# Patient Record
Sex: Female | Born: 1955 | Race: Asian | Hispanic: No | Smoking: Former smoker
Health system: Southern US, Community
[De-identification: ages and names within clinical notes are randomized; demographics above are authoritative.]

## PROBLEM LIST (undated history)

## (undated) DIAGNOSIS — N3080 Other cystitis without hematuria: Secondary | ICD-10-CM

## (undated) DIAGNOSIS — R109 Unspecified abdominal pain: Secondary | ICD-10-CM

## (undated) DIAGNOSIS — E7800 Pure hypercholesterolemia, unspecified: Secondary | ICD-10-CM

## (undated) DIAGNOSIS — M7502 Adhesive capsulitis of left shoulder: Secondary | ICD-10-CM

## (undated) DIAGNOSIS — N179 Acute kidney failure, unspecified: Secondary | ICD-10-CM

## (undated) DIAGNOSIS — I10 Essential (primary) hypertension: Secondary | ICD-10-CM

## (undated) DIAGNOSIS — E119 Type 2 diabetes mellitus without complications: Secondary | ICD-10-CM

## (undated) DIAGNOSIS — J45909 Unspecified asthma, uncomplicated: Secondary | ICD-10-CM

## (undated) DIAGNOSIS — E876 Hypokalemia: Secondary | ICD-10-CM

## (undated) DIAGNOSIS — F32A Depression, unspecified: Secondary | ICD-10-CM

## (undated) DIAGNOSIS — B3781 Candidal esophagitis: Secondary | ICD-10-CM

## (undated) DIAGNOSIS — F329 Major depressive disorder, single episode, unspecified: Secondary | ICD-10-CM

## (undated) DIAGNOSIS — K852 Alcohol induced acute pancreatitis without necrosis or infection: Secondary | ICD-10-CM

## (undated) DIAGNOSIS — E1110 Type 2 diabetes mellitus with ketoacidosis without coma: Secondary | ICD-10-CM

## (undated) DIAGNOSIS — N189 Chronic kidney disease, unspecified: Secondary | ICD-10-CM

## (undated) HISTORY — DX: Hypokalemia: E87.6

## (undated) HISTORY — DX: Major depressive disorder, single episode, unspecified: F32.9

## (undated) HISTORY — DX: Candidal esophagitis: B37.81

## (undated) HISTORY — DX: Adhesive capsulitis of left shoulder: M75.02

## (undated) HISTORY — DX: Unspecified abdominal pain: R10.9

## (undated) HISTORY — DX: Alcohol induced acute pancreatitis without necrosis or infection: K85.20

## (undated) HISTORY — DX: Acute kidney failure, unspecified: N18.9

## (undated) HISTORY — DX: Type 2 diabetes mellitus with ketoacidosis without coma: E11.10

## (undated) HISTORY — DX: Other cystitis without hematuria: N30.80

## (undated) HISTORY — DX: Depression, unspecified: F32.A

## (undated) HISTORY — PX: EYE SURGERY: SHX253

## (undated) HISTORY — DX: Chronic kidney disease, unspecified: N17.9

## (undated) HISTORY — DX: Acute kidney failure, unspecified: N17.9

---

## 1986-01-05 HISTORY — PX: HAND SURGERY: SHX662

## 1994-01-05 HISTORY — PX: ABDOMINAL HYSTERECTOMY: SHX81

## 1995-01-06 HISTORY — PX: APPENDECTOMY: SHX54

## 2008-09-18 ENCOUNTER — Emergency Department: Payer: Self-pay | Admitting: Emergency Medicine

## 2010-12-01 ENCOUNTER — Emergency Department: Payer: Self-pay | Admitting: Internal Medicine

## 2010-12-05 ENCOUNTER — Inpatient Hospital Stay: Payer: Self-pay | Admitting: Otolaryngology

## 2011-01-06 HISTORY — PX: THYROID SURGERY: SHX805

## 2011-04-20 ENCOUNTER — Emergency Department: Payer: Self-pay | Admitting: *Deleted

## 2011-04-20 LAB — URINALYSIS, COMPLETE
Bilirubin,UR: NEGATIVE
Glucose,UR: >=500 mg/dL (ref 0–75)
Ph: 5 (ref 4.5–8.0)
RBC,UR: 5 /HPF (ref 0–5)
Specific Gravity: 1.012 (ref 1.003–1.030)
Squamous Epithelial: 3
WBC UR: 310 /HPF (ref 0–5)

## 2011-04-20 LAB — COMPREHENSIVE METABOLIC PANEL
Alkaline Phosphatase: 152 U/L — ABNORMAL HIGH (ref 50–136)
Anion Gap: 20 — ABNORMAL HIGH (ref 7–16)
Calcium, Total: 9.4 mg/dL (ref 8.5–10.1)
Co2: 16 mmol/L — ABNORMAL LOW (ref 21–32)
EGFR (African American): >60
Glucose: 300 mg/dL — ABNORMAL HIGH (ref 65–99)
SGPT (ALT): 41 U/L
Sodium: 135 mmol/L — ABNORMAL LOW (ref 136–145)

## 2011-04-20 LAB — CBC
HCT: 42.7 % (ref 35.0–47.0)
HGB: 14 g/dL (ref 12.0–16.0)
RBC: 4.51 10*6/uL (ref 3.80–5.20)

## 2011-05-29 ENCOUNTER — Observation Stay: Payer: Self-pay | Admitting: Specialist

## 2011-05-29 LAB — BASIC METABOLIC PANEL
Anion Gap: 14 (ref 7–16)
Calcium, Total: 8.7 mg/dL (ref 8.5–10.1)
Chloride: 98 mmol/L (ref 98–107)
Co2: 24 mmol/L (ref 21–32)
Creatinine: 0.47 mg/dL — ABNORMAL LOW (ref 0.60–1.30)
EGFR (African American): >60
EGFR (Non-African Amer.): >60
Glucose: 252 mg/dL — ABNORMAL HIGH (ref 65–99)
Osmolality: 279 (ref 275–301)
Potassium: 3.6 mmol/L (ref 3.5–5.1)
Sodium: 136 mmol/L (ref 136–145)

## 2011-05-29 LAB — CK TOTAL AND CKMB (NOT AT ARMC)
CK, Total: 58 U/L (ref 21–215)
CK, Total: 51 U/L (ref 21–215)
CK-MB: <0.5 ng/mL — ABNORMAL LOW (ref 0.5–3.6)
CK-MB: 0.6 ng/mL (ref 0.5–3.6)

## 2011-05-29 LAB — HEPATIC FUNCTION PANEL A (ARMC)
Alkaline Phosphatase: 114 U/L (ref 50–136)
Bilirubin, Direct: <0.1 mg/dL (ref 0.00–0.20)
Bilirubin,Total: 0.3 mg/dL (ref 0.2–1.0)
SGOT(AST): 29 U/L (ref 15–37)
SGPT (ALT): 41 U/L
Total Protein: 8.2 g/dL (ref 6.4–8.2)

## 2011-05-29 LAB — CBC
MCHC: 32.7 g/dL (ref 32.0–36.0)
MCV: 95 fL (ref 80–100)
RBC: 4.28 10*6/uL (ref 3.80–5.20)
RDW: 12.8 % (ref 11.5–14.5)
WBC: 5.4 10*3/uL (ref 3.6–11.0)

## 2011-05-29 LAB — TROPONIN I: Troponin-I: <0.02 ng/mL

## 2011-05-30 LAB — BASIC METABOLIC PANEL
Anion Gap: 14 (ref 7–16)
BUN: 17 mg/dL (ref 7–18)
Calcium, Total: 7.8 mg/dL — ABNORMAL LOW (ref 8.5–10.1)
Chloride: 100 mmol/L (ref 98–107)
Co2: 23 mmol/L (ref 21–32)
EGFR (African American): >60
EGFR (Non-African Amer.): >60
Osmolality: 287 (ref 275–301)
Potassium: 4 mmol/L (ref 3.5–5.1)
Sodium: 137 mmol/L (ref 136–145)

## 2011-05-30 LAB — CBC WITH DIFFERENTIAL/PLATELET
Basophil #: 0 10*3/uL (ref 0.0–0.1)
Basophil %: 0.5 %
Eosinophil #: 0.1 10*3/uL (ref 0.0–0.7)
HGB: 13.1 g/dL (ref 12.0–16.0)
Lymphocyte %: 27.3 %
MCH: 32 pg (ref 26.0–34.0)
MCHC: 33.9 g/dL (ref 32.0–36.0)
MCV: 95 fL (ref 80–100)
Monocyte #: 0.8 x10 3/mm (ref 0.2–0.9)
Monocyte %: 10.2 %
Platelet: 145 10*3/uL — ABNORMAL LOW (ref 150–440)
RBC: 4.1 10*6/uL (ref 3.80–5.20)
RDW: 12.7 % (ref 11.5–14.5)
WBC: 7.5 10*3/uL (ref 3.6–11.0)

## 2011-05-30 LAB — CK TOTAL AND CKMB (NOT AT ARMC)
CK, Total: 53 U/L (ref 21–215)
CK-MB: 1.2 ng/mL (ref 0.5–3.6)

## 2011-05-30 LAB — TROPONIN I: Troponin-I: <0.02 ng/mL

## 2011-09-01 ENCOUNTER — Inpatient Hospital Stay: Payer: Self-pay | Admitting: Internal Medicine

## 2011-09-01 LAB — COMPREHENSIVE METABOLIC PANEL
BUN: 14 mg/dL (ref 7–18)
Calcium, Total: 7.9 mg/dL — ABNORMAL LOW (ref 8.5–10.1)
EGFR (African American): >60
EGFR (Non-African Amer.): >60
Glucose: 496 mg/dL — ABNORMAL HIGH (ref 65–99)
SGOT(AST): 89 U/L — ABNORMAL HIGH (ref 15–37)
SGPT (ALT): 98 U/L — ABNORMAL HIGH (ref 12–78)
Sodium: 125 mmol/L — ABNORMAL LOW (ref 136–145)
Total Protein: 9.4 g/dL — ABNORMAL HIGH (ref 6.4–8.2)

## 2011-09-01 LAB — BASIC METABOLIC PANEL
Anion Gap: 17 — ABNORMAL HIGH (ref 7–16)
BUN: 17 mg/dL (ref 7–18)
BUN: 19 mg/dL — ABNORMAL HIGH (ref 7–18)
Chloride: 114 mmol/L — ABNORMAL HIGH (ref 98–107)
Chloride: 115 mmol/L — ABNORMAL HIGH (ref 98–107)
Co2: 6 mmol/L — CL (ref 21–32)
Creatinine: 0.93 mg/dL (ref 0.60–1.30)
EGFR (African American): >60
EGFR (African American): >60
EGFR (Non-African Amer.): >60
Glucose: 251 mg/dL — ABNORMAL HIGH (ref 65–99)
Potassium: 3.2 mmol/L — ABNORMAL LOW (ref 3.5–5.1)
Potassium: 3.1 mmol/L — ABNORMAL LOW (ref 3.5–5.1)
Sodium: 140 mmol/L (ref 136–145)

## 2011-09-01 LAB — URINALYSIS, COMPLETE
Bilirubin,UR: NEGATIVE
Glucose,UR: >=500 mg/dL (ref 0–75)
Leukocyte Esterase: NEGATIVE
Ph: 6 (ref 4.5–8.0)
Specific Gravity: 1.017 (ref 1.003–1.030)
Squamous Epithelial: 1
WBC UR: 1 /HPF (ref 0–5)

## 2011-09-01 LAB — CBC WITH DIFFERENTIAL/PLATELET
Bands: 6 %
Comment - H1-Com3: NORMAL
HGB: 16.2 g/dL — ABNORMAL HIGH (ref 12.0–16.0)
MCHC: 32.8 g/dL (ref 32.0–36.0)
Metamyelocyte: 1 %
Promyelocyte: 1 %
RBC: 4.97 10*6/uL (ref 3.80–5.20)
Segmented Neutrophils: 56 %
WBC: 12.3 10*3/uL — ABNORMAL HIGH (ref 3.6–11.0)

## 2011-09-01 LAB — LIPASE, BLOOD: Lipase: >3000 U/L (ref 73–393)

## 2011-09-02 LAB — CBC WITH DIFFERENTIAL/PLATELET
Basophil #: 0 10*3/uL (ref 0.0–0.1)
Eosinophil #: 0 10*3/uL (ref 0.0–0.7)
HCT: 40.7 % (ref 35.0–47.0)
HGB: 13.6 g/dL (ref 12.0–16.0)
Lymphocyte #: 0.4 10*3/uL — ABNORMAL LOW (ref 1.0–3.6)
Lymphocyte %: 5.5 %
MCHC: 33.4 g/dL (ref 32.0–36.0)
Monocyte %: 12.4 %
Neutrophil #: 6.3 10*3/uL (ref 1.4–6.5)
Platelet: 115 10*3/uL — ABNORMAL LOW (ref 150–440)
RBC: 4.3 10*6/uL (ref 3.80–5.20)
RDW: 12.8 % (ref 11.5–14.5)

## 2011-09-02 LAB — COMPREHENSIVE METABOLIC PANEL
Alkaline Phosphatase: 113 U/L (ref 50–136)
Anion Gap: 15 (ref 7–16)
Calcium, Total: 8 mg/dL — ABNORMAL LOW (ref 8.5–10.1)
Chloride: 114 mmol/L — ABNORMAL HIGH (ref 98–107)
Co2: 14 mmol/L — ABNORMAL LOW (ref 21–32)
EGFR (Non-African Amer.): 51 — ABNORMAL LOW
SGOT(AST): 43 U/L — ABNORMAL HIGH (ref 15–37)
SGPT (ALT): 57 U/L (ref 12–78)

## 2011-09-02 LAB — LIPID PANEL
Cholesterol: 395 mg/dL — ABNORMAL HIGH (ref 0–200)
HDL Cholesterol: 52 mg/dL (ref 40–60)
Triglycerides: 1436 mg/dL — ABNORMAL HIGH (ref 0–200)

## 2011-09-02 LAB — POTASSIUM
Potassium: 3 mmol/L — ABNORMAL LOW (ref 3.5–5.1)
Potassium: 2.7 mmol/L — ABNORMAL LOW (ref 3.5–5.1)

## 2011-09-02 LAB — MAGNESIUM: Magnesium: 1.9 mg/dL

## 2011-09-03 LAB — BASIC METABOLIC PANEL
Anion Gap: 18 — ABNORMAL HIGH (ref 7–16)
Anion Gap: 10 (ref 7–16)
BUN: 14 mg/dL (ref 7–18)
BUN: 12 mg/dL (ref 7–18)
Calcium, Total: 8 mg/dL — ABNORMAL LOW (ref 8.5–10.1)
Calcium, Total: 8.1 mg/dL — ABNORMAL LOW (ref 8.5–10.1)
Chloride: 114 mmol/L — ABNORMAL HIGH (ref 98–107)
Chloride: 116 mmol/L — ABNORMAL HIGH (ref 98–107)
Chloride: 112 mmol/L — ABNORMAL HIGH (ref 98–107)
Co2: 10 mmol/L — CL (ref 21–32)
Co2: 16 mmol/L — ABNORMAL LOW (ref 21–32)
Co2: 13 mmol/L — ABNORMAL LOW (ref 21–32)
Creatinine: 1 mg/dL (ref 0.60–1.30)
Creatinine: 0.85 mg/dL (ref 0.60–1.30)
EGFR (African American): >60
EGFR (African American): >60
EGFR (Non-African Amer.): >60
EGFR (Non-African Amer.): >60
Glucose: 194 mg/dL — ABNORMAL HIGH (ref 65–99)
Glucose: 364 mg/dL — ABNORMAL HIGH (ref 65–99)
Osmolality: 296 (ref 275–301)
Osmolality: 289 (ref 275–301)
Osmolality: 290 (ref 275–301)
Potassium: 3.3 mmol/L — ABNORMAL LOW (ref 3.5–5.1)
Potassium: 4.3 mmol/L (ref 3.5–5.1)
Sodium: 142 mmol/L (ref 136–145)
Sodium: 138 mmol/L (ref 136–145)

## 2011-09-03 LAB — LIPASE, BLOOD
Lipase: >3000 U/L (ref 73–393)
Lipase: 2478 U/L — ABNORMAL HIGH (ref 73–393)

## 2011-09-03 LAB — POTASSIUM: Potassium: 3.1 mmol/L — ABNORMAL LOW (ref 3.5–5.1)

## 2011-09-04 LAB — BASIC METABOLIC PANEL
Anion Gap: 10 (ref 7–16)
BUN: 8 mg/dL (ref 7–18)
BUN: 7 mg/dL (ref 7–18)
Calcium, Total: 8.4 mg/dL — ABNORMAL LOW (ref 8.5–10.1)
Calcium, Total: 8.5 mg/dL (ref 8.5–10.1)
Chloride: 115 mmol/L — ABNORMAL HIGH (ref 98–107)
Co2: 17 mmol/L — ABNORMAL LOW (ref 21–32)
Co2: 18 mmol/L — ABNORMAL LOW (ref 21–32)
Creatinine: 0.8 mg/dL (ref 0.60–1.30)
EGFR (African American): >60
EGFR (African American): >60
EGFR (Non-African Amer.): >60
EGFR (Non-African Amer.): >60
Glucose: 142 mg/dL — ABNORMAL HIGH (ref 65–99)
Glucose: 138 mg/dL — ABNORMAL HIGH (ref 65–99)
Osmolality: 284 (ref 275–301)
Osmolality: 285 (ref 275–301)
Sodium: 143 mmol/L (ref 136–145)

## 2011-09-04 LAB — HEMOGLOBIN: HGB: 12.5 g/dL (ref 12.0–16.0)

## 2011-10-21 ENCOUNTER — Emergency Department: Payer: Self-pay | Admitting: *Deleted

## 2011-10-21 LAB — CBC
MCHC: 35.4 g/dL (ref 32.0–36.0)
MCV: 90 fL (ref 80–100)
Platelet: 187 10*3/uL (ref 150–440)
RDW: 12 % (ref 11.5–14.5)
WBC: 8.2 10*3/uL (ref 3.6–11.0)

## 2011-10-21 LAB — URINALYSIS, COMPLETE
Blood: NEGATIVE
Glucose,UR: >=500 mg/dL (ref 0–75)
Leukocyte Esterase: NEGATIVE
Nitrite: NEGATIVE
Protein: NEGATIVE
Specific Gravity: 1.009 (ref 1.003–1.030)
Squamous Epithelial: 1
WBC UR: 1 /HPF (ref 0–5)

## 2011-10-21 LAB — COMPREHENSIVE METABOLIC PANEL
Albumin: 3.9 g/dL (ref 3.4–5.0)
Anion Gap: 12 (ref 7–16)
Bilirubin,Total: 0.5 mg/dL (ref 0.2–1.0)
Calcium, Total: 8.6 mg/dL (ref 8.5–10.1)
Co2: 23 mmol/L (ref 21–32)
Creatinine: 0.77 mg/dL (ref 0.60–1.30)
Glucose: 301 mg/dL — ABNORMAL HIGH (ref 65–99)
Osmolality: 285 (ref 275–301)
Potassium: 3.7 mmol/L (ref 3.5–5.1)
Sodium: 137 mmol/L (ref 136–145)

## 2011-12-20 ENCOUNTER — Emergency Department: Payer: Self-pay | Admitting: Emergency Medicine

## 2012-02-10 ENCOUNTER — Emergency Department: Payer: Self-pay | Admitting: Emergency Medicine

## 2012-02-10 LAB — BASIC METABOLIC PANEL
BUN: 15 mg/dL (ref 7–18)
Chloride: 100 mmol/L (ref 98–107)
EGFR (African American): >60
EGFR (Non-African Amer.): >60
Glucose: 150 mg/dL — ABNORMAL HIGH (ref 65–99)
Osmolality: 274 (ref 275–301)

## 2012-02-10 LAB — URINALYSIS, COMPLETE
Blood: NEGATIVE
Nitrite: NEGATIVE
Specific Gravity: 1.002 (ref 1.003–1.030)
Squamous Epithelial: 3
WBC UR: 10 /HPF (ref 0–5)

## 2012-02-10 LAB — TROPONIN I: Troponin-I: <0.02 ng/mL

## 2012-02-10 LAB — CBC
HGB: 12.9 g/dL (ref 12.0–16.0)
MCH: 30.3 pg (ref 26.0–34.0)
MCV: 89 fL (ref 80–100)
RBC: 4.26 10*6/uL (ref 3.80–5.20)
RDW: 12.2 % (ref 11.5–14.5)
WBC: 8.9 10*3/uL (ref 3.6–11.0)

## 2012-02-10 LAB — CK TOTAL AND CKMB (NOT AT ARMC)
CK, Total: 84 U/L (ref 21–215)
CK-MB: 1.3 ng/mL (ref 0.5–3.6)

## 2012-11-16 ENCOUNTER — Emergency Department: Payer: Self-pay | Admitting: Emergency Medicine

## 2012-12-13 ENCOUNTER — Emergency Department: Payer: Self-pay | Admitting: Emergency Medicine

## 2012-12-13 LAB — COMPREHENSIVE METABOLIC PANEL
Albumin: 3.7 g/dL (ref 3.4–5.0)
Alkaline Phosphatase: 156 U/L — ABNORMAL HIGH
Anion Gap: 8 (ref 7–16)
Chloride: 101 mmol/L (ref 98–107)
Creatinine: 0.78 mg/dL (ref 0.60–1.30)
EGFR (African American): >60
EGFR (Non-African Amer.): >60
Glucose: 285 mg/dL — ABNORMAL HIGH (ref 65–99)
Osmolality: 285 (ref 275–301)
Potassium: 3.9 mmol/L (ref 3.5–5.1)
SGOT(AST): 57 U/L — ABNORMAL HIGH (ref 15–37)
Total Protein: 8.3 g/dL — ABNORMAL HIGH (ref 6.4–8.2)

## 2012-12-13 LAB — CBC
HCT: 40.3 % (ref 35.0–47.0)
MCH: 31.7 pg (ref 26.0–34.0)
MCHC: 34.5 g/dL (ref 32.0–36.0)
MCV: 92 fL (ref 80–100)
Platelet: 221 10*3/uL (ref 150–440)

## 2013-08-11 ENCOUNTER — Emergency Department: Payer: Self-pay | Admitting: Student

## 2013-08-11 LAB — URINALYSIS, COMPLETE
Bacteria: NONE SEEN
Bilirubin,UR: NEGATIVE
Glucose,UR: >=500 mg/dL (ref 0–75)
Hyaline Cast: 2
Leukocyte Esterase: NEGATIVE
Nitrite: NEGATIVE
PH: 5 (ref 4.5–8.0)
Protein: 100
RBC,UR: 9 /HPF (ref 0–5)
Specific Gravity: 1.03 (ref 1.003–1.030)
Squamous Epithelial: 10
WBC UR: 17 /HPF (ref 0–5)

## 2013-08-11 LAB — BASIC METABOLIC PANEL
ANION GAP: 17 — AB (ref 7–16)
BUN: 20 mg/dL — AB (ref 7–18)
CHLORIDE: 94 mmol/L — AB (ref 98–107)
Calcium, Total: 9 mg/dL (ref 8.5–10.1)
Co2: 21 mmol/L (ref 21–32)
Creatinine: 0.84 mg/dL (ref 0.60–1.30)
EGFR (African American): >60
EGFR (Non-African Amer.): >60
Glucose: 294 mg/dL — ABNORMAL HIGH (ref 65–99)
OSMOLALITY: 278 (ref 275–301)
Potassium: 3.8 mmol/L (ref 3.5–5.1)
Sodium: 132 mmol/L — ABNORMAL LOW (ref 136–145)

## 2013-08-11 LAB — HEPATIC FUNCTION PANEL A (ARMC)
ALBUMIN: 3.9 g/dL (ref 3.4–5.0)
ALT: 108 U/L — AB
Alkaline Phosphatase: 140 U/L — ABNORMAL HIGH
BILIRUBIN DIRECT: 0.2 mg/dL (ref 0.00–0.20)
BILIRUBIN TOTAL: 0.9 mg/dL (ref 0.2–1.0)
SGOT(AST): 65 U/L — ABNORMAL HIGH (ref 15–37)
TOTAL PROTEIN: 8.9 g/dL — AB (ref 6.4–8.2)

## 2013-08-11 LAB — CBC
HCT: 45.9 % (ref 35.0–47.0)
HGB: 15.1 g/dL (ref 12.0–16.0)
MCH: 31 pg (ref 26.0–34.0)
MCHC: 32.8 g/dL (ref 32.0–36.0)
MCV: 95 fL (ref 80–100)
Platelet: 162 10*3/uL (ref 150–440)
RBC: 4.86 10*6/uL (ref 3.80–5.20)
RDW: 11.7 % (ref 11.5–14.5)
WBC: 5.5 10*3/uL (ref 3.6–11.0)

## 2013-08-11 LAB — PREGNANCY, URINE: PREGNANCY TEST, URINE: NEGATIVE m[IU]/mL

## 2013-08-11 LAB — TROPONIN I: Troponin-I: <0.02 ng/mL

## 2013-08-11 LAB — LIPASE, BLOOD: LIPASE: 229 U/L (ref 73–393)

## 2014-04-24 NOTE — Discharge Summary (Signed)
PATIENT NAME:  Karen [name], OKUN MR#:  [id number] DATE OF BIRTH:  Jul 15, 1955  DATE OF ADMISSION:  09/01/2011 DATE OF DISCHARGE:  09/06/2011  PRIMARY CARE PHYSICIAN: Dr. Brynda Greathouse  PRESENTING COMPLAINT: Abdominal pain, nausea, vomiting.   DISCHARGE DIAGNOSES:  1. Acute pancreatitis suspected from hypertriglyceridemia and alcohol abuse.  2. Diabetic ketoacidosis with type 2 diabetes.  3. Dehydration.  4. Medical noncompliance.  5. Hypokalemia.   CONDITION ON DISCHARGE: Fair.   MEDICATIONS:  1. Omeprazole 20 mg daily.  2. Multivitamin p.o. daily.  3. Glipizide 10 mg p.o. b.i.d.  4. Metformin 500 p.o. b.i.d.  5. Aspirin 81 mg daily.  6. Ativan 0.5 mg every six hours as needed.  7. Promethazine 12.5 mg 1 tablet q.6 hours as needed.   DIET: Low fat, low cholesterol, carbohydrate controlled diet.    FOLLOW UP: Follow up with Dr. Brynda Greathouse in 1 to 2 weeks.   LABORATORY, DIAGNOSTIC AND RADIOLOGICAL DATA: Basic metabolic panel within normal limits except glucose of 138, potassium of 3.7, chloride of 115, bicarbonate 18. Lipase 905. Hemoglobin 12.5. Lipase on admission was more than 3000. Magnesium 1.9. Cholesterol 395, triglycerides 1436. Blood cultures no growth in five days. Ultrasound of the abdomen shows pancreas was partially obscured, visualized pancreatic head and body unremarkable. 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   BRIEF SUMMARY OF HOSPITAL COURSE: [name] is a 59 year old female with past medical history of type 2 diabetes, history of alcohol abuse comes to the Emergency Room and got admitted with:  1. Diabetic ketoacidosis due to poorly controlled type 2 diabetes secondary to medical noncompliance. Patient was started on insulin drip, placed in the Intensive Care Unit. Once her acidosis improved she was started on sub-Q insulin along with p.o. metformin and glipizide. She was slowly weaned off the insulin and placed on metformin and her home dose of  metformin and glipizide was added. Patient was educated regarding dietary and medical compliance.  2. Acute pancreatitis, appears alcohol induced and likely due to hypertriglyceridemia. Ultrasound showed adenomyomatosis of the gallbladder. Her lipase was more than 3000 at admission, at discharge it was 905. She was started on gemfibrozil for hypertriglyceridemia, however, she was having vomiting hence discontinued at this time and will defer it to Dr. Brynda Greathouse to start something for hypertriglyceridemia as outpatient.  3. Nausea, vomiting due to pancreatitis and diabetic ketoacidosis, improved.  4. Chronic alcoholism. Patient was placed on CIWA protocol. She did not exhibit signs, symptoms of withdrawal. She is recommended to quit drinking.   TIME SPENT: 40 minutes.   ____________________________ Hart Rochester Posey Pronto, MD sap:cms D: 09/08/2011 15:25:22 ET T: 09/09/2011 13:35:26 ET JOB#: [id number]  cc: Ramah Langhans A. Posey Pronto, MD, <Dictator> Mikeal Hawthorne. Brynda Greathouse, MD Ilda Basset MD ELECTRONICALLY SIGNED 09/21/2011 22:13

## 2014-04-24 NOTE — H&P (Signed)
PATIENT NAME:  Karen [name], Karen [name] MR#:  [id number] DATE OF BIRTH:  Oct 08, 1955  DATE OF ADMISSION:  09/01/2011  ER REFERRING PHYSICIAN: Latina Craver, MD   PRIMARY CARE PHYSICIAN: Nicky Pugh, MD    CHIEF COMPLAINT: Vomiting and abdominal pain since the last four days.   PRESENTATION: The patient is a 59 year old female having a past medical history of diabetes and was taking oral diabetic medications for the last five years.  She started feeling abdominal pain which was epigastric and continuous and dull in nature, and she started having vomiting for the last three days.  Initially for one or two days the pain was mild, but she could not tolerate her diet; and after the second day, yesterday, she started having very severe vomiting and abdominal pain.  She could not eat or drink anything and had to remain in the bed the whole day, and she was severely in distress because of the pain. So, finally, she decided to come to the hospital.  She also complained of fever; she did not measure how much or take any medicine for it because she was having severe vomiting. She denies any cough, sputum, chest pain, burning in the urination, rashes on the skin, headache or diarrhea. She has not passed any stool for the last two days, and she felt the reason for that was not being able to eat anything for the last three days. She is also an alcoholic and has been drinking 2 to 3 beers every day except the last 2 to 3 days because of vomiting.  She had a similar episode in the past 5 to 6 months and needed to get admitted to the hospital because of high sugar.   PAST MEDICAL HISTORY: Diabetes for 5 years.    PAST SURGICAL HISTORY: None.   HOME MEDICATIONS:   1. Aspirin 81 mg oral tablet once a day.  2. Glipizide 2.5 mg tablet once a day. 3. Metformin 500 mg tablet twice a day.  SOCIAL HISTORY: Ex-smoker, stopped smoking six months before. Alcohol drinking every day 2 to 3 beers. She denies any use of illicit drugs.    REVIEW OF SYSTEMS: CONSTITUTIONAL: Fever is present, weakness is present. No weight loss or gain. EYES: No change in vision, no burning. ENT: No tinnitus or hearing loss, ear pain or ear discharge. RESPIRATORY: No cough, wheeze, hemoptysis, dyspnea or shortness of breath. CARDIOVASCULAR: No chest pain, edema of the leg or palpitation. GASTROINTESTINAL: Nausea, vomiting, abdominal pain present as described in history and presentation.  Denies any diarrhea or jaundice. GENITOURINARY: No dysuria, hematuria, or increased frequency. SKIN: No rash or change in color. MUSCULOSKELETAL: No swelling or pain in any joint. NEUROLOGICAL:  Denies numbness, weakness, any seizures, headache, memory loss. PSYCHIATRIC: Denies any insomnia, anxiety, bipolar disorder, nervousness.   PHYSICAL EXAMINATION: VITAL SIGNS: Heart rate 100, blood pressure 110/78 while lying down, respirations 26, oxygen saturation 96 on room air.   GENERAL: She is alert and oriented to time, place and person.  She is in mild distress because of abdominal pain and feeling nauseous.   HEENT: Conjunctiva are red.  Oral mucosa are dry.   NECK: Supple, no mass.   RESPIRATORY: Increased rate, clear breath sounds bilaterally.   CARDIAC: Mild tachycardia, regular, no jugular venous distention, no melena.  ABDOMEN: Mild tenderness epigastric and umbilical region. No mass. Slightly distended, no fluid thrill, bowel sounds sluggish.   EXTREMITIES: No edema, no cyanosis.   SKIN: Cold to palpation, no rash.  NEUROLOGICAL:  Cranial nerves are intact, follows commands.  Motor and sensory are intact.   ASSESSMENT: A 59 year old female who presented with severe metabolic acidosis due to diabetic ketoacidosis which is most likely the result of acute pancreatitis.     PROBLEM LIST: 1. Diabetic ketoacidosis: She already received a 2-liter bolus of normal saline by the Emergency Room.  We will continue normal saline at 250 mL/hr and put her on  insulin drip protocol for the diabetic ketoacidosis. We will place her in the Critical Care Unit  for further management.  2. Acute pancreatitis: Keep n.p.o., IV fluid, morphine for pain management and promethazine for vomiting.  3. Severe acidosis and electrolyte imbalance: Due to diabetic ketoacidosis and dehydration.  We will check arterial blood gas stat and keep a followthrough after starting the treatment with insulin drip. Potassium is 5.9 right now.  It is secondary to diabetic ketoacidosis, but there are no EKG changes, so no treatment is needed for it right now except insulin drip and continuous followup with basic metabolic panel.  4. Chronic alcoholism: The patient and her family are explained in detail about the habit of alcoholism and the long-term risks of having repeated pancreatitis, hepatitis, and multiple other health-related issues with alcoholism.  She agrees to quit alcohol this time.   CONDITION: Her condition is very critical, and the risk of cardiac arrhythmias and death were explained to the family and patient in detail.  They understand and agree with the plan.  We will admit the patient to the Critical Care Unit.   TIME SPENT:   Total time spent with the patient and family was 55 minutes.   ____________________________ Ceasar Lund Anselm Jungling, MD vgv:cbb D: 09/01/2011 12:30:51 ET T: 09/01/2011 13:08:08 ET JOB#: [id number]  cc: Ceasar Lund. Anselm Jungling, MD, <Dictator> Mikeal Hawthorne. Brynda Greathouse, MD Vaughan Basta MD ELECTRONICALLY SIGNED 09/23/2011 14:52

## 2014-04-29 NOTE — Discharge Summary (Signed)
PATIENT NAME:  Karen [name], Karen [name] MR#:  [id number] DATE OF BIRTH:  09-16-1955  DATE OF ADMISSION:  05/29/2011 DATE OF DISCHARGE:  05/30/2011  For a detailed note, please take a look at the history and physical done by Dr. Holley Raring on admission.   DISCHARGE DIAGNOSES:  1. Chest pain likely secondary to musculoskeletal reasons. 2. Acute coronary syndrome, ruled out. 3. Uncontrolled diabetes. 4. Hyperlipidemia. 5. Tobacco abuse.  6. History of alcohol abuse.   DIET: The patient is being discharged on a low-sodium, American Diabetic Association low-fat diet.   ACTIVITY: As tolerated.  DISCHARGE FOLLOWUP: Followup with Dr. Nicky Pugh in the next 1 to 2 weeks.   DISCHARGE MEDICATIONS:  1. Aspirin 81 mg daily.  2. Metformin 500 mg twice a day. 3. Glipizide 2.5 mg daily.   NOTE: The patient is to hold her metformin for the next two days.   PERTINENT LABS/STUDIES: CT scan of the chest done with contrast showed no evidence of any pulmonary emboli, a small 1 cm adrenal nodule, which likely needs to be followed.   Chest x-ray done on admission showed no acute cardiopulmonary disease.   HOSPITAL COURSE: This is a 59 year old female who presented to the hospital with chest pain.  1. Chest pain - the patient had very atypical and typical symptoms for angina. She does have risk factors given her diabetes and ongoing tobacco abuse. She was therefore observed overnight on telemetry and had three sets of cardiac markers checked which were negative. She also underwent a stress test which showed no evidence of acute myocardial ischemia. Her chest pain was likely musculoskeletal in nature. She was therefore discharged back on some aspirin, as stated. She used to be on a statin but has been intolerant to them and I told her to discuss this further with her primary care physician.  2. Diabetes - the patient's blood sugars are significantly uncontrolled. They were in the 200 to 300 range. The patient has been  significantly noncompliant with her metformin. I told her that she can resume her metformin after two days as she had a stress test with some contrast. I did give her a prescription for glipizide at 2.5 mg daily. She can use that along with the metformin once she resumes her metformin as stated. As mentioned, hemoglobin A1c was 11 and her diabetes is significantly uncontrolled, and she was strongly advised to be compliant with her medications.  3. Hyperlipidemia - the patient did not have a lipid profile checked here in the hospital. She used to be on a statin but has been intolerant to them. As mentioned, I told her to discuss this with Dr. Brynda Greathouse.  4. History of alcohol abuse - the patient was placed on CIWA protocol but did not have any evidence of alcohol withdrawal.   CODE STATUS: The patient is a FULL CODE.  TIME SPENT: 35 minutes. ____________________________ Belia Heman. Verdell Carmine, MD vjs:slb D: 05/30/2011 13:34:04 ET T: 06/01/2011 11:55:14 ET JOB#: [id number]  cc: Belia Heman. Verdell Carmine, MD, <Dictator> Mikeal Hawthorne. Brynda Greathouse, MD Henreitta Leber MD ELECTRONICALLY SIGNED 06/01/2011 15:25

## 2014-04-29 NOTE — H&P (Signed)
PATIENT NAME:  Karen [name], Karen [name] MR#:  [id number] DATE OF BIRTH:  [birth year]  DATE OF ADMISSION:  05/29/2011  REFERRING PHYSICIAN: Dr. Marta Antu.  PRIMARY CARE PHYSICIAN: Dr. Brynda Greathouse.   CHIEF COMPLAINT: "My chest was hurting."   HISTORY OF PRESENT ILLNESS: The patient is a 59 year old [place] female with past medical history of diabetes mellitus, hyperlipidemia, former tobacco abuse and ongoing alcohol abuse who presents to the Emergency Department with the above-mentioned chief complaint. The patient states that she awoke around 3:30 a.m. out of sleep due to severe chest pain located in the substernal portion of her chest and also to the left substernal region with radiation to the left arm and shoulder as well as her fingers on the left hand, initially severe, 9 out of 10 in intensity, described as a stabbing sensation associated with shortness of breath, diaphoresis, and dizziness. She notified her boyfriend who called EMS and she was brought to the ER for further evaluation. Her chest pain has lasted for approximately six hours and then upon EMS arrival, she received some sublingual nitroglycerin, which led to improvement of her chest pain. Thereafter, she was brought to the ER and nitroglycerin patch was applied to her anterior chest wall further reducing her chest pain from a level of 9, later to 8, down to 5/10 currently. She states she feels somewhat better. Chest pain now aching as opposed to stabbing which she experienced earlier around 3:30 this morning. Otherwise, she is without specific complaints at this time. Troponin was checked in the ER, found to be negative and EKG did not reveal any acute ST-T changes and thereafter, hospitalist services were contacted for further evaluation and for hospital admission.   PAST SURGICAL HISTORY: Unspecified thyroid/neck surgery.   PAST MEDICAL HISTORY:   1. Type 2 diabetes mellitus. 2. Hyperlipidemia, currently diet controlled. The patient had allergic  reaction to unknown cholesterol pill.  3. Former tobacco abuse. 4. Ongoing alcohol abuse.   ALLERGIES: Penicillin, Tylenol and unknown cholesterol pill that caused her to break out in a rash. The patient unaware of name.   HOME MEDICATIONS: The patient states she is supposed to be on a sugar pill but has not been taking this regularly and does not take daily. She took this a couple of days ago.   FAMILY HISTORY: Both parents deceased. The patient is not aware of any medical problems in her parents. There is no family history of myocardial infarction, per the patient.   SOCIAL HISTORY: Tobacco, quit 2 to 3 months ago. Past use included 1 to 2 packs per day from age 25 to 59. She was smoking heavier while she would drink alcohol. Alcohol, ongoing,  drinks beer every day, at least three 40-ounce bottles of beer. Illicit drugs: None. The patient lives in [place], [place]. She comes accompanied by her boyfriend today.   REVIEW OF SYSTEMS: CONSTITUTIONAL: Denies fevers, chills, recent changes in weight or weakness, has some chest pain. HEAD/EYES: Denies headache or blurry vision. ENT: Denies tinnitus, earache, nasal discharge or sore throat. RESPIRATORY: Had some shortness of breath associated with her chest pain. Denies cough or wheezing. CARDIOVASCULAR: Has chest pain. Denies orthopnea or lower extremity edema or heart palpitations. GASTROINTESTINAL: Denies nausea, vomiting, diarrhea, constipation, melena, hematochezia, or abdominal pain. GENITOURINARY: Denies dysuria or hematuria. ENDOCRINE: Denies heat or cold intolerance. HEME/LYMPH: Denies easy bruising or bleeding. INTEGUMENTARY: Denies rash or lesions. MUSCULOSKELETAL: Has some pain in her chest. Denies back pain or muscle weakness. NEUROLOGIC: Denies headache, numbness,  tingling, or dysarthria. PSYCHIATRIC: Denies depression or anxiety. Denies suicidal or homicidal ideation.   PHYSICAL EXAMINATION:  VITAL SIGNS: Temperature 97.6, pulse 87,  blood pressure 124/76, respirations 16, oxygen saturation 97% on room air.   GENERAL: The patient is alert and oriented, not in acute distress.   HEENT: Normocephalic, atraumatic. Pupils are equal, round and reactive to light accommodation. Extraocular muscles are intact. Anicteric sclerae. Conjunctivae pink. Hearing intact to voice. Nares without drainage. Oral mucosa moist without lesions.   NECK: Supple with full range of motion. No jugular venous distention, lymphadenopathy, or carotid bruits bilaterally. Evidence of a scar on the neck from possible thyroid surgery. No palpable masses.   CHEST: Normal respiratory effort without use of accessory respiratory muscles.   LUNGS: Clear to auscultation bilaterally without crackles, rales, or wheezing.   CARDIOVASCULAR: S1, S2 positive. Regular rate and rhythm. No murmurs, rubs, or gallops. PMI is non-lateralized. No tenderness to palpation over anterior chest wall.   ABDOMEN: Soft, nontender, nondistended. Normoactive bowel sounds. No hepatosplenomegaly or palpable masses. No hernias.   EXTREMITIES: No clubbing, cyanosis, or edema. Pedal pulses are palpable bilaterally.   SKIN: No suspicious rashes. Skin turgor is good.   LYMPH: No cervical lymphadenopathy.   NEUROLOGIC: Alert and oriented x3. Cranial nerves II through XII are grossly intact. No focal deficits. No asterixis.   PSYCH: Appropriate affect.   LABORATORY, RADIOLOGICAL AND DIAGNOSTIC DATA: CBC within normal limits. BMP within normal limits except for serum glucose elevated at 252. Troponin is less than 0.02. CK 58. CK-MB less than 0.5. EKG reveals normal sinus rhythm, heart rate 84 beats per minute, normal axis, slightly prolonged QT at 491 milliseconds. No acute ST or T wave changes noted. Portable chest x-ray is pending at this time and will be followed.   ASSESSMENT AND PLAN: 59 year old female with past medical history of diabetes mellitus, noncompliant with medications,  former extensive tobacco abuse, hyperlipidemia and alcohol abuse, here with chest pain while at rest that woke her up from sleep with: 1. Chest pain with concern for unstable angina Will admit the patient to telemetry unit for further evaluation and work-up. She reports typical chest pain with radiation to the arm associated with shortness of breath, diaphoresis, and dizziness, described as a stabbing sensation which has improved with nitroglycerin therapy. She has multiple risk factors for coronary artery disease including former extensive tobacco abuse, diabetes mellitus, hyperlipidemia, and warrants further work-up. We will rule out myocardial infarction, but continue to cycle her cardiac enzymes. In the meanwhile, will keep the patient on oxygen, aspirin Lovenox and nitrate therapy. I would like to also use beta blocker, however, her current resting blood pressure may not tolerate this. Therefore, will need to follow closely. Will risk stratify by checking her lipids and checking hemoglobin A1c. If she rules out for myocardial infarction, will proceed with treadmill Myoview tomorrow morning and primary team to consider cardiology evaluation as well. Further work-up and management to follow depending on the patient's clinical course. EKG is without acute ST or T wave changes. She does have mild prolongation of her QT and will check magnesium level and also check a thyroid stimulating hormone, especially given her history of thyroid surgery.  2. Type 2 diabetes mellitus. The patient has been counseled that she needs to remain compliant with her medications that her primary care physician has prescribed her and she is not aware of the sugar pill that she is supposed to be taking. For now, we will check hemoglobin  A1c and keep the patient on sliding scale insulin for now. Will hospitalize and monitor sugars closely. Consider agent such as metformin upon discharge.  3. Hyperlipidemia. The patient states she was  allergic to a cholesterol pill and broke out in a rash and not sure which one. Will check fasting lipid profile for now. She is currently on diet control for her hyperlipidemia.  4. Alcohol abuse. The patient was counseled on the importance of abstinence from alcohol consumption once she has been detoxed. Will keep her on CIWA for now and also thiamine, folate, and multivitamin therapy.  5. Deep venous thrombosis prophylaxis. Lovenox.   CODE STATUS: FULL CODE.   TIME SPENT WITH ADMISSION: Approximately 45 minutes.   ____________________________ Romie Jumper, MD knl:ap D: 05/29/2011 13:23:50 ET T: 05/29/2011 13:56:24 ET JOB#: [id number]  cc: Romie Jumper, MD, <Dictator> Mikeal Hawthorne Brynda Greathouse, MD  Romie Jumper MD ELECTRONICALLY SIGNED 06/15/2011 19:38

## 2014-05-16 ENCOUNTER — Encounter: Payer: Self-pay | Admitting: Emergency Medicine

## 2014-05-16 ENCOUNTER — Emergency Department
Admission: EM | Admit: 2014-05-16 | Discharge: 2014-05-16 | Discharge status: Against Medical Advice | Payer: Medicaid Other | Attending: Emergency Medicine | Admitting: Emergency Medicine

## 2014-05-16 DIAGNOSIS — R319 Hematuria, unspecified: Secondary | ICD-10-CM | POA: Insufficient documentation

## 2014-05-16 HISTORY — DX: Pure hypercholesterolemia, unspecified: E78.00

## 2014-05-16 HISTORY — DX: Type 2 diabetes mellitus without complications: E11.9

## 2014-05-16 LAB — CBC WITH DIFFERENTIAL/PLATELET
Basophils Absolute: 0.1 10*3/uL (ref 0–0.1)
Basophils Relative: 1 %
EOS PCT: 1 %
Eosinophils Absolute: 0.1 10*3/uL (ref 0–0.7)
HEMATOCRIT: 38.1 % (ref 35.0–47.0)
HEMOGLOBIN: 12.7 g/dL (ref 12.0–16.0)
LYMPHS ABS: 2.2 10*3/uL (ref 1.0–3.6)
LYMPHS PCT: 26 %
MCH: 30.5 pg (ref 26.0–34.0)
MCHC: 33.2 g/dL (ref 32.0–36.0)
MCV: 91.7 fL (ref 80.0–100.0)
MONO ABS: 0.9 10*3/uL (ref 0.2–0.9)
MONOS PCT: 11 %
Neutro Abs: 5.2 10*3/uL (ref 1.4–6.5)
Neutrophils Relative %: 61 %
Platelets: 176 10*3/uL (ref 150–440)
RBC: 4.15 MIL/uL (ref 3.80–5.20)
RDW: 11.6 % (ref 11.5–14.5)
WBC: 8.4 10*3/uL (ref 3.6–11.0)

## 2014-05-16 LAB — URINALYSIS COMPLETE WITH MICROSCOPIC (ARMC ONLY)
BILIRUBIN URINE: NEGATIVE
NITRITE: NEGATIVE
PH: 6 (ref 5.0–8.0)
Protein, ur: NEGATIVE mg/dL
Specific Gravity, Urine: 1.003 — ABNORMAL LOW (ref 1.005–1.030)

## 2014-05-16 LAB — COMPREHENSIVE METABOLIC PANEL
ALBUMIN: 3.9 g/dL (ref 3.5–5.0)
ALT: 30 U/L (ref 14–54)
AST: 31 U/L (ref 15–41)
Alkaline Phosphatase: 125 U/L (ref 38–126)
Anion gap: 13 (ref 5–15)
BUN: 21 mg/dL — ABNORMAL HIGH (ref 6–20)
CALCIUM: 9.2 mg/dL (ref 8.9–10.3)
CO2: 24 mmol/L (ref 22–32)
CREATININE: 0.75 mg/dL (ref 0.44–1.00)
Chloride: 93 mmol/L — ABNORMAL LOW (ref 101–111)
GFR calc Af Amer: >60 mL/min (ref >60)
GFR calc non Af Amer: >60 mL/min (ref >60)
Glucose, Bld: 350 mg/dL — ABNORMAL HIGH (ref 65–99)
Potassium: 4.1 mmol/L (ref 3.5–5.1)
Sodium: 130 mmol/L — ABNORMAL LOW (ref 135–145)
Total Bilirubin: 0.4 mg/dL (ref 0.3–1.2)
Total Protein: 8.4 g/dL — ABNORMAL HIGH (ref 6.5–8.1)

## 2014-05-16 LAB — LIPASE, BLOOD: Lipase: 68 U/L — ABNORMAL HIGH (ref 22–51)

## 2014-05-16 NOTE — ED Notes (Addendum)
Reports abd pain in the mornings x 3 months.  States it feels like "its boiling".  Reports a little blood in urine this am.  States she went to her MD for it a few weeks ago and had neg uti.  States "my coo-coo makes a noise when i pee"

## 2015-02-17 ENCOUNTER — Inpatient Hospital Stay
Admission: EM | Admit: 2015-02-17 | Discharge: 2015-02-19 | DRG: 438 | Disposition: A | Discharge status: Home / Self Care | Payer: Medicaid Other | Attending: Internal Medicine | Admitting: Internal Medicine

## 2015-02-17 ENCOUNTER — Emergency Department: Payer: Medicaid Other

## 2015-02-17 ENCOUNTER — Encounter: Payer: Self-pay | Admitting: Emergency Medicine

## 2015-02-17 DIAGNOSIS — B9620 Unspecified Escherichia coli [E. coli] as the cause of diseases classified elsewhere: Secondary | ICD-10-CM | POA: Diagnosis present

## 2015-02-17 DIAGNOSIS — F1010 Alcohol abuse, uncomplicated: Secondary | ICD-10-CM | POA: Diagnosis present

## 2015-02-17 DIAGNOSIS — K852 Alcohol induced acute pancreatitis without necrosis or infection: Principal | ICD-10-CM | POA: Diagnosis present

## 2015-02-17 DIAGNOSIS — E1310 Other specified diabetes mellitus with ketoacidosis without coma: Secondary | ICD-10-CM | POA: Diagnosis present

## 2015-02-17 DIAGNOSIS — E1110 Type 2 diabetes mellitus with ketoacidosis without coma: Secondary | ICD-10-CM

## 2015-02-17 DIAGNOSIS — E876 Hypokalemia: Secondary | ICD-10-CM | POA: Diagnosis present

## 2015-02-17 DIAGNOSIS — Z87891 Personal history of nicotine dependence: Secondary | ICD-10-CM

## 2015-02-17 DIAGNOSIS — N3080 Other cystitis without hematuria: Secondary | ICD-10-CM | POA: Diagnosis present

## 2015-02-17 DIAGNOSIS — Z9114 Patient's other noncompliance with medication regimen: Secondary | ICD-10-CM | POA: Diagnosis not present

## 2015-02-17 DIAGNOSIS — K859 Acute pancreatitis, unspecified: Secondary | ICD-10-CM

## 2015-02-17 DIAGNOSIS — N179 Acute kidney failure, unspecified: Secondary | ICD-10-CM | POA: Diagnosis present

## 2015-02-17 DIAGNOSIS — A419 Sepsis, unspecified organism: Secondary | ICD-10-CM

## 2015-02-17 DIAGNOSIS — E785 Hyperlipidemia, unspecified: Secondary | ICD-10-CM | POA: Diagnosis present

## 2015-02-17 HISTORY — DX: Type 2 diabetes mellitus with ketoacidosis without coma: E11.10

## 2015-02-17 LAB — BASIC METABOLIC PANEL
ANION GAP: 21 — AB (ref 5–15)
Anion gap: 19 — ABNORMAL HIGH (ref 5–15)
Anion gap: 13 (ref 5–15)
Anion gap: 12 (ref 5–15)
BUN: 33 mg/dL — ABNORMAL HIGH (ref 6–20)
BUN: 30 mg/dL — AB (ref 6–20)
BUN: 26 mg/dL — AB (ref 6–20)
BUN: 25 mg/dL — AB (ref 6–20)
CALCIUM: 7.3 mg/dL — AB (ref 8.9–10.3)
CALCIUM: 7.2 mg/dL — AB (ref 8.9–10.3)
CHLORIDE: 113 mmol/L — AB (ref 101–111)
CHLORIDE: 112 mmol/L — AB (ref 101–111)
CO2: 13 mmol/L — ABNORMAL LOW (ref 22–32)
CO2: 12 mmol/L — ABNORMAL LOW (ref 22–32)
CO2: 18 mmol/L — AB (ref 22–32)
CO2: 18 mmol/L — AB (ref 22–32)
CREATININE: 1.21 mg/dL — AB (ref 0.44–1.00)
CREATININE: 1.12 mg/dL — AB (ref 0.44–1.00)
CREATININE: 1.06 mg/dL — AB (ref 0.44–1.00)
Calcium: 7.6 mg/dL — ABNORMAL LOW (ref 8.9–10.3)
Calcium: 7.4 mg/dL — ABNORMAL LOW (ref 8.9–10.3)
Chloride: 103 mmol/L (ref 101–111)
Chloride: 111 mmol/L (ref 101–111)
Creatinine, Ser: 1.54 mg/dL — ABNORMAL HIGH (ref 0.44–1.00)
GFR calc Af Amer: >60 mL/min (ref >60)
GFR calc Af Amer: >60 mL/min (ref >60)
GFR calc non Af Amer: 53 mL/min — ABNORMAL LOW (ref >60)
GFR calc non Af Amer: 57 mL/min — ABNORMAL LOW (ref >60)
GFR, EST AFRICAN AMERICAN: 42 mL/min — AB (ref >60)
GFR, EST AFRICAN AMERICAN: 56 mL/min — AB (ref >60)
GFR, EST NON AFRICAN AMERICAN: 36 mL/min — AB (ref >60)
GFR, EST NON AFRICAN AMERICAN: 48 mL/min — AB (ref >60)
GLUCOSE: 395 mg/dL — AB (ref 65–99)
GLUCOSE: 167 mg/dL — AB (ref 65–99)
Glucose, Bld: 280 mg/dL — ABNORMAL HIGH (ref 65–99)
Glucose, Bld: 145 mg/dL — ABNORMAL HIGH (ref 65–99)
POTASSIUM: 3.7 mmol/L (ref 3.5–5.1)
Potassium: 3.4 mmol/L — ABNORMAL LOW (ref 3.5–5.1)
Potassium: 3.4 mmol/L — ABNORMAL LOW (ref 3.5–5.1)
Potassium: 3.2 mmol/L — ABNORMAL LOW (ref 3.5–5.1)
SODIUM: 137 mmol/L (ref 135–145)
SODIUM: 142 mmol/L (ref 135–145)
Sodium: 144 mmol/L (ref 135–145)
Sodium: 142 mmol/L (ref 135–145)

## 2015-02-17 LAB — COMPREHENSIVE METABOLIC PANEL
ALBUMIN: 4.4 g/dL (ref 3.5–5.0)
ALK PHOS: 101 U/L (ref 38–126)
ALT: 20 U/L (ref 14–54)
AST: 22 U/L (ref 15–41)
Anion gap: 41 — ABNORMAL HIGH (ref 5–15)
BILIRUBIN TOTAL: 2.2 mg/dL — AB (ref 0.3–1.2)
BUN: 39 mg/dL — ABNORMAL HIGH (ref 6–20)
CALCIUM: 9.2 mg/dL (ref 8.9–10.3)
CO2: 10 mmol/L — ABNORMAL LOW (ref 22–32)
Chloride: 88 mmol/L — ABNORMAL LOW (ref 101–111)
Creatinine, Ser: 1.99 mg/dL — ABNORMAL HIGH (ref 0.44–1.00)
GFR calc Af Amer: 31 mL/min — ABNORMAL LOW (ref >60)
GFR calc non Af Amer: 26 mL/min — ABNORMAL LOW (ref >60)
GLUCOSE: 641 mg/dL — AB (ref 65–99)
Potassium: 4.3 mmol/L (ref 3.5–5.1)
Sodium: 139 mmol/L (ref 135–145)
TOTAL PROTEIN: 9.5 g/dL — AB (ref 6.5–8.1)

## 2015-02-17 LAB — GLUCOSE, CAPILLARY
GLUCOSE-CAPILLARY: 456 mg/dL — AB (ref 65–99)
GLUCOSE-CAPILLARY: 375 mg/dL — AB (ref 65–99)
GLUCOSE-CAPILLARY: 307 mg/dL — AB (ref 65–99)
GLUCOSE-CAPILLARY: 231 mg/dL — AB (ref 65–99)
GLUCOSE-CAPILLARY: 196 mg/dL — AB (ref 65–99)
GLUCOSE-CAPILLARY: 187 mg/dL — AB (ref 65–99)
GLUCOSE-CAPILLARY: 166 mg/dL — AB (ref 65–99)
Glucose-Capillary: 190 mg/dL — ABNORMAL HIGH (ref 65–99)
Glucose-Capillary: 147 mg/dL — ABNORMAL HIGH (ref 65–99)
Glucose-Capillary: 112 mg/dL — ABNORMAL HIGH (ref 65–99)
Glucose-Capillary: 118 mg/dL — ABNORMAL HIGH (ref 65–99)
Glucose-Capillary: 145 mg/dL — ABNORMAL HIGH (ref 65–99)
Glucose-Capillary: 212 mg/dL — ABNORMAL HIGH (ref 65–99)

## 2015-02-17 LAB — CBC
HEMATOCRIT: 44.2 % (ref 35.0–47.0)
Hemoglobin: 14.1 g/dL (ref 12.0–16.0)
MCH: 30.1 pg (ref 26.0–34.0)
MCHC: 31.8 g/dL — AB (ref 32.0–36.0)
MCV: 94.6 fL (ref 80.0–100.0)
Platelets: 243 10*3/uL (ref 150–440)
RBC: 4.68 MIL/uL (ref 3.80–5.20)
RDW: 13 % (ref 11.5–14.5)
WBC: 11.6 10*3/uL — ABNORMAL HIGH (ref 3.6–11.0)

## 2015-02-17 LAB — URINALYSIS COMPLETE WITH MICROSCOPIC (ARMC ONLY)
Bilirubin Urine: NEGATIVE
Glucose, UA: >500 mg/dL — AB
Nitrite: NEGATIVE
Specific Gravity, Urine: 1.016 (ref 1.005–1.030)
pH: 6 (ref 5.0–8.0)

## 2015-02-17 LAB — TROPONIN I

## 2015-02-17 LAB — LIPASE, BLOOD: Lipase: 642 U/L — ABNORMAL HIGH (ref 11–51)

## 2015-02-17 LAB — MRSA PCR SCREENING: MRSA by PCR: NEGATIVE

## 2015-02-17 MED ORDER — FENTANYL CITRATE (PF) 100 MCG/2ML IJ SOLN
50.0000 ug | Freq: Once | INTRAMUSCULAR | Status: AC
  Administered 2015-02-17: 50 ug via INTRAVENOUS
  Filled 2015-02-17: qty 2

## 2015-02-17 MED ORDER — MORPHINE SULFATE (PF) 2 MG/ML IV SOLN
1.0000 mg | INTRAVENOUS | Status: DC | PRN
  Administered 2015-02-17 – 2015-02-19 (×2): 1 mg via INTRAVENOUS
  Filled 2015-02-17 (×2): qty 1

## 2015-02-17 MED ORDER — SODIUM CHLORIDE 0.9 % IV BOLUS (SEPSIS)
1000.0000 mL | Freq: Once | INTRAVENOUS | Status: AC
  Administered 2015-02-17: 1000 mL via INTRAVENOUS

## 2015-02-17 MED ORDER — SODIUM CHLORIDE 0.9 % IV SOLN
INTRAVENOUS | Status: DC
  Administered 2015-02-17: 5.4 [IU]/h via INTRAVENOUS
  Filled 2015-02-17: qty 2.5

## 2015-02-17 MED ORDER — ONDANSETRON HCL 4 MG/2ML IJ SOLN
4.0000 mg | Freq: Four times a day (QID) | INTRAMUSCULAR | Status: DC | PRN
  Administered 2015-02-17: 4 mg via INTRAVENOUS
  Filled 2015-02-17: qty 2

## 2015-02-17 MED ORDER — LORAZEPAM 1 MG PO TABS: 1.0000 mg | ORAL_TABLET | Freq: Four times a day (QID) | ORAL | Status: DC | PRN

## 2015-02-17 MED ORDER — ENOXAPARIN SODIUM 40 MG/0.4ML ~~LOC~~ SOLN
40.0000 mg | SUBCUTANEOUS | Status: DC
  Administered 2015-02-17 – 2015-02-18 (×2): 40 mg via SUBCUTANEOUS
  Filled 2015-02-17 (×2): qty 0.4

## 2015-02-17 MED ORDER — INSULIN ASPART 100 UNIT/ML ~~LOC~~ SOLN: 10.0000 [IU] | Freq: Once | SUBCUTANEOUS | Status: DC

## 2015-02-17 MED ORDER — ONDANSETRON HCL 4 MG/2ML IJ SOLN
4.0000 mg | Freq: Once | INTRAMUSCULAR | Status: AC | PRN
  Administered 2015-02-17: 4 mg via INTRAVENOUS
  Filled 2015-02-17: qty 2

## 2015-02-17 MED ORDER — ONDANSETRON HCL 4 MG PO TABS: 4.0000 mg | ORAL_TABLET | Freq: Four times a day (QID) | ORAL | Status: DC | PRN

## 2015-02-17 MED ORDER — MORPHINE SULFATE (PF) 2 MG/ML IV SOLN
2.0000 mg | INTRAVENOUS | Status: DC | PRN
  Administered 2015-02-18 (×2): 2 mg via INTRAVENOUS
  Filled 2015-02-17 (×2): qty 1

## 2015-02-17 MED ORDER — IOHEXOL 300 MG/ML  SOLN
75.0000 mL | Freq: Once | INTRAMUSCULAR | Status: AC | PRN
  Administered 2015-02-17: 75 mL via INTRAVENOUS

## 2015-02-17 MED ORDER — FENTANYL CITRATE (PF) 100 MCG/2ML IJ SOLN
50.0000 ug | Freq: Once | INTRAMUSCULAR | Status: AC
Start: 2015-02-17 — End: 2015-02-17
  Administered 2015-02-17: 50 ug via INTRAVENOUS
  Filled 2015-02-17: qty 2

## 2015-02-17 MED ORDER — CIPROFLOXACIN IN D5W 400 MG/200ML IV SOLN
400.0000 mg | Freq: Once | INTRAVENOUS | Status: AC
  Administered 2015-02-17: 400 mg via INTRAVENOUS
  Filled 2015-02-17: qty 200

## 2015-02-17 MED ORDER — SODIUM CHLORIDE 0.9% FLUSH
3.0000 mL | Freq: Two times a day (BID) | INTRAVENOUS | Status: DC
  Administered 2015-02-17 – 2015-02-19 (×4): 3 mL via INTRAVENOUS

## 2015-02-17 MED ORDER — ONDANSETRON HCL 4 MG/2ML IJ SOLN
4.0000 mg | Freq: Once | INTRAMUSCULAR | Status: AC
  Administered 2015-02-17: 4 mg via INTRAVENOUS
  Filled 2015-02-17: qty 2

## 2015-02-17 MED ORDER — CIPROFLOXACIN IN D5W 400 MG/200ML IV SOLN
400.0000 mg | Freq: Two times a day (BID) | INTRAVENOUS | Status: DC
  Filled 2015-02-17 (×2): qty 200

## 2015-02-17 MED ORDER — THIAMINE HCL 100 MG/ML IJ SOLN
Freq: Once | INTRAVENOUS | Status: DC
  Filled 2015-02-17: qty 1000

## 2015-02-17 MED ORDER — LORAZEPAM 2 MG/ML IJ SOLN
1.0000 mg | Freq: Four times a day (QID) | INTRAMUSCULAR | Status: DC | PRN
  Administered 2015-02-17: 1 mg via INTRAVENOUS
  Filled 2015-02-17: qty 1

## 2015-02-17 MED ORDER — CIPROFLOXACIN IN D5W 400 MG/200ML IV SOLN
400.0000 mg | Freq: Two times a day (BID) | INTRAVENOUS | Status: DC
  Administered 2015-02-17 – 2015-02-18 (×3): 400 mg via INTRAVENOUS
  Filled 2015-02-17 (×5): qty 200

## 2015-02-17 MED ORDER — KCL IN DEXTROSE-NACL 20-5-0.45 MEQ/L-%-% IV SOLN
INTRAVENOUS | Status: DC
  Administered 2015-02-17: 17:00:00 via INTRAVENOUS
  Filled 2015-02-17 (×3): qty 1000

## 2015-02-17 MED ORDER — MORPHINE SULFATE (PF) 4 MG/ML IV SOLN: 4.0000 mg | INTRAVENOUS | Status: DC | PRN

## 2015-02-17 MED ORDER — METOCLOPRAMIDE HCL 5 MG/ML IJ SOLN
5.0000 mg | Freq: Four times a day (QID) | INTRAMUSCULAR | Status: DC | PRN
  Administered 2015-02-17: 5 mg via INTRAVENOUS
  Filled 2015-02-17: qty 2

## 2015-02-17 MED ORDER — IOHEXOL 240 MG/ML SOLN
25.0000 mL | INTRAMUSCULAR | Status: AC
  Administered 2015-02-17: 25 mL via ORAL

## 2015-02-17 MED ORDER — PROMETHAZINE HCL 25 MG/ML IJ SOLN
25.0000 mg | Freq: Once | INTRAMUSCULAR | Status: AC
  Administered 2015-02-17: 25 mg via INTRAVENOUS
  Filled 2015-02-17: qty 1

## 2015-02-17 MED ORDER — FENTANYL CITRATE (PF) 100 MCG/2ML IJ SOLN
50.0000 ug | Freq: Once | INTRAMUSCULAR | Status: DC
  Filled 2015-02-17: qty 2

## 2015-02-17 MED ORDER — PANTOPRAZOLE SODIUM 40 MG IV SOLR
40.0000 mg | INTRAVENOUS | Status: DC
  Administered 2015-02-18: 40 mg via INTRAVENOUS
  Filled 2015-02-17: qty 40

## 2015-02-17 MED ORDER — MORPHINE SULFATE (PF) 2 MG/ML IV SOLN: 2.0000 mg | INTRAVENOUS | Status: DC | PRN

## 2015-02-17 NOTE — Progress Notes (Signed)
[facility] Progress Note Patient Name: MAYETTA JEUNE DOB: Mar 18, 1955 MRN: [id number]   Date of Service  02/17/2015  HPI/Events of Note  Ongoing issues with nausea and pain not controlled with zofran and morphine.  On CIWA protocol.  HD stable with sats of 98% and RR of 22.  eICU Interventions  Plan: One time dose of 25 mg phenergan IV and fentanyl 50 mcg IV.     Intervention Category Intermediate Interventions: Pain - evaluation and management  Michel Eskelson 02/17/2015, 11:23 PM

## 2015-02-17 NOTE — ED Notes (Signed)
Glucose stabalizer rate adjusted

## 2015-02-17 NOTE — H&P (Signed)
[facility] NAME: Karen [name]    MR#:  [id number]  DATE OF BIRTH:  Feb 16, 1955  DATE OF ADMISSION:  02/17/2015  PRIMARY CARE PHYSICIAN: Marden Noble, MD   REQUESTING/REFERRING PHYSICIAN: Mariea Clonts  CHIEF COMPLAINT:  Abdominal pain, nausea and vomiting  HISTORY OF PRESENT ILLNESS:  Karen [name]  is a 60 y.o. female with a known history of diabetes mellitus and hyperlipidemia is presenting to the ED with a chief complaint of two-day history of nausea and vomiting  associated with abdominal pain and has been feeling sick for 5 days and stopped taking her insulin. Drinks beer every day and last intake was 2 days ago. In the ED patient's blood sugar is at 641 with anion gap 41. Patient is started on IV fluids, insulin drip and repeat BMP has revealed sugar at 395 and anion gap at 21. 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 Diabetes mellitus without complication ([place])   . Hypercholesteremia     PAST SURGICAL HISTOIRY:   Past Surgical History  Procedure Laterality Date  . Abdominal hysterectomy    . Appendectomy    . Thyroid surgery      SOCIAL HISTORY:   Social History  Substance Use Topics  . Smoking status: Former Research scientist (life sciences)  . Smokeless tobacco: Not on file  . Alcohol Use: Yes     Comment: once weekly   reports drinking 5 beers 4-5 times in a week  FAMILY HISTORY:   Diabetes runs in her family DRUG ALLERGIES:   Allergies  Allergen Reactions  . Penicillins Anaphylaxis    Has patient had a PCN reaction causing immediate rash, facial/tongue/throat swelling, SOB or lightheadedness with hypotension: Yes Has patient had a PCN reaction causing severe rash involving mucus membranes or skin necrosis: No Has patient had a PCN reaction that required hospitalization No Has patient had a PCN reaction occurring within the last 10  years: No If all of the above answers are "NO", then may proceed with Cephalosporin use.  Marland Kitchen Percocet [Oxycodone-Acetaminophen] Rash    REVIEW OF SYSTEMS:  CONSTITUTIONAL: No fever, reporting fatigue Arneta Cliche.  EYES: No blurred or double vision.  EARS, NOSE, AND THROAT: No tinnitus or ear pain.  RESPIRATORY: No cough, shortness of breath, wheezing or hemoptysis.  CARDIOVASCULAR: No chest pain, orthopnea, edema.  GASTROINTESTINAL: Reporting nausea, vomiting, diarrhea and generalized abdominal pain for 2 days GENITOURINARY: No dysuria, hematuria.  ENDOCRINE: No polyuria, nocturia,  HEMATOLOGY: No anemia, easy bruising or bleeding SKIN: No rash or lesion. MUSCULOSKELETAL: No joint pain or arthritis.   NEUROLOGIC: No tingling, numbness, weakness.  PSYCHIATRY: No anxiety or depression.   MEDICATIONS AT HOME:   Prior to Admission medications   Medication Sig Start Date End Date Taking? Authorizing Provider  insulin aspart protamine - aspart (NOVOLOG MIX 70/30 FLEXPEN) (70-30) 100 UNIT/ML FlexPen Inject 15 Units into the skin 2 (two) times daily.   Yes Historical Provider, MD      VITAL SIGNS:  Blood pressure 137/75, pulse 99, temperature 97.5 F (36.4 C), temperature source Oral, resp. rate 25, height 5\' 1"  (1.549 m), weight 49.442 kg (109 lb), SpO2 97 %.  PHYSICAL EXAMINATION:  GENERAL:  60 y.o.-year-old patient lying in the bed with no acute distress.  EYES: Pupils equal, round, reactive to light and accommodation. No scleral icterus. Extraocular muscles intact.  HEENT: Head atraumatic,  normocephalic. Oropharynx and nasopharynx clear. Dry mucous membranes NECK:  Supple, no jugular venous distention. No thyroid enlargement, no tenderness.  LUNGS: Normal breath sounds bilaterally, no wheezing, rales,rhonchi or crepitation. No use of accessory muscles of respiration.  CARDIOVASCULAR: S1, S2 normal. No murmurs, rubs, or gallops.  ABDOMEN: Soft, diffusely tender, distended. Patient is  guarding, could not elicit rebound tenderness Bowel sounds present. No organomegaly or mass.  EXTREMITIES: No pedal edema, cyanosis, or clubbing.  NEUROLOGIC: Cranial nerves II through XII are intact. Muscle strength 5/5 in all extremities. Sensation intact. Gait not checked.  PSYCHIATRIC: The patient is alert and oriented x 3.  SKIN: No obvious rash, lesion, or ulcer.   LABORATORY PANEL:   CBC  Recent Labs Lab 02/17/15 0654  WBC 11.6*  HGB 14.1  HCT 44.2  PLT 243   ------------------------------------------------------------------------------------------------------------------  Chemistries   Recent Labs Lab 02/17/15 0654  02/17/15 1331  NA 139  < > 142  K 4.3  < > 3.4*  CL 88*  < > 111  CO2 10*  < > 12*  GLUCOSE 641*  < > 280*  BUN 39*  < > 30*  CREATININE 1.99*  < > 1.21*  CALCIUM 9.2  < > 7.2*  AST 22  --   --   ALT 20  --   --   ALKPHOS 101  --   --   BILITOT 2.2*  --   --   < > = values in this interval not displayed. ------------------------------------------------------------------------------------------------------------------  Cardiac Enzymes  Recent Labs Lab 02/17/15 0654  TROPONINI <0.03   ------------------------------------------------------------------------------------------------------------------  RADIOLOGY:  Ct Abdomen Pelvis W Contrast  02/17/2015  CLINICAL DATA:  60 year old female with acute abdominal pain and vomiting for 1 day. EXAM: CT ABDOMEN AND PELVIS WITH CONTRAST TECHNIQUE: Multidetector CT imaging of the abdomen and pelvis was performed using the standard protocol following bolus administration of intravenous contrast. CONTRAST:  55mL OMNIPAQUE IOHEXOL 300 MG/ML  SOLN COMPARISON:  None. FINDINGS: Lower chest:  Unremarkable Hepatobiliary: Hepatic steatosis identified without focal hepatic abnormality. The gallbladder is unremarkable. There is no evidence of biliary dilatation. Pancreas: There is equivocal stranding/ inflammation along  the pancreatic tail. The pancreas is otherwise unremarkable. Spleen: Unremarkable Adrenals/Urinary Tract: The bladder is distended containing gas. Gas along the walls of the bladder also identified-compatible with emphysematous cystitis. There is mild enhancement of the ureteral walls bilaterally likely representing infection. There is no evidence of renal mass, abscess, hydronephrosis or gas within the kidneys. The adrenal glands are unremarkable. Stomach/Bowel: Mild circumferential wall thickening of the distal esophagus likely represents esophagitis. There is no evidence of bowel obstruction or other definite areas of bowel wall thickening. Colonic diverticulosis noted. Vascular/Lymphatic: Aortic atherosclerotic calcifications noted without aneurysm. No enlarged lymph nodes identified. Reproductive: The patient is status post hysterectomy. There is no evidence of adnexal mass. Other:  No free fluid, abscess or pneumoperitoneum Musculoskeletal: No acute or suspicious abnormalities. IMPRESSION: Emphysematous cystitis. Equivocal stranding/inflammation along the pancreatic tail which could represent mild pancreatitis. Mild distal esophagitis. Hepatic steatosis. Aortic atherosclerosis. Electronically Signed   By: Margarette Canada M.D.   On: 02/17/2015 11:03    EKG:   Orders placed or performed during the hospital encounter of 02/17/15  . ED EKG  . ED EKG  . EKG 12-Lead  . EKG 12-Lead    IMPRESSION AND PLAN:   60 year old female presenting to the ED with a chief complaint of abdominal pain, nausea and vomiting. Stop taking her insulin for 5 days  as she was feeling sick. Initial blood sugar was at 641 with anion gap at 41.   patient is started on IV fluids and insulin drip  #DKA with severe anion gap metabolic acidosis secondary to noncompliance with insulin   admit to intensive care unit  Provide aggressive hydration with IV fluids and patient is on insulin drip Check BMP every 6 hours   repeat a.m. labs  and check hemoglobin A1c.   consult is placed to diabetic coordinator and patient needs diabetes education  #Acute abdominal pain with nausea and vomiting secondary to emphysematous cystitis Urine culture and sensitivity is ordered by the ED physician. Place a Foley catheter for 1 week as recommended by urology Patient is started on IV ciprofloxacin Urology consult is placed and notified to on-call urologist  #Acute alcoholic pancreatitis Nothing by mouth, IV fluids and pain management. Monitor liver function panel closely and counseled patient to stop drinking alcohol. Outpatient follow-up with alcohol rehabilitation Center is recommended to the patient ciwa  #Hyperlipidemia Check fasting lipid panel including triglycerides and patient is not on any cholesterol medications  #Nausea and vomiting with abdominal pain probably from emphysematous cystitis and acute pancreatitis Provide pain management and antiemetics IV fluids and PPI  GI prophylaxis with Protonix and DVT prophylaxis with Lovenox subcutaneous   All the records are reviewed and case discussed with ED provider. Management plans discussed with the patient and she is in agreement.  CODE STATUS: full code, boyfriend is the healthcare power of attorney  TOTAL critical care  TIME TAKING CARE OF THIS PATIENT: 50 minutes.    Nicholes Mango M.D on 02/17/2015 at 3:48 PM  Between 7am to 6pm - Pager - [phone]  After 6pm go to [email or web address] - password [email or web address] [facility]  Office  [phone]  CC: Primary care physician; Marden Noble, MD

## 2015-02-17 NOTE — ED Notes (Signed)
Pt transported to CT ?

## 2015-02-17 NOTE — ED Notes (Addendum)
Glucose stabalizer rate adjusted

## 2015-02-17 NOTE — ED Notes (Signed)
Pt insulin rate decreased from 5.4 to 4.0. Pts blood sugar 456. Pt c/o bloating/tightness in her stomach. Pt reports no more nausea.

## 2015-02-17 NOTE — Progress Notes (Signed)
eLink Physician-Brief Progress Note Patient Name: Karen [name] DOB: 11/24/1955 MRN: [id number]   Date of Service  02/17/2015  HPI/Events of Note  Patient admitted with DKA, renal insufficiency and elevated lipase. Patient reports being non-compliant with meds at home.  eICU Interventions  IVF, insulin, NPO. Anion gap improving. Glucose trending down.     Intervention Category Evaluation Type: New Patient Evaluation  Dimas Chyle 02/17/2015, 6:54 PM

## 2015-02-17 NOTE — Consult Note (Addendum)
Reason for Consult: Emphysematous Cystitis Referring Physician: Dr. Nicholes Mango  Karen [name] is an 60 y.o. female.  HPI: 60 y.o. female with a known history of diabetes mellitus and hyperlipidemia who presented to the ER due to nausea, vomiting, and abdominal pain.  No alleviating factors.  This has been ongoing for approximately 4-5 days.  She also endorses non-compliance with her insulin dosing. In th ER, she was noted to be in DKA and was admitted to the ICU for close monitoring.  In the ER, she did undergo a CT A/P which demonstrated a distended bladder with emphysematous changes consistent with emphysematous cystitis.  The patient also was diagnosed with acute pancreatitis.   She is not a great historian however does report difficulty with voiding on occasion, she describes some dysuria but this is not clear from her history. Denies hematuria, is not aware of recent urinary infections.  She denies fevers at home.   In the ER, they were unable to place a foley catheter.    Past Medical History  Diagnosis Date  . Diabetes mellitus without complication ([place])   . Hypercholesteremia     Past Surgical History  Procedure Laterality Date  . Abdominal hysterectomy    . Appendectomy    . Thyroid surgery      History reviewed. No pertinent family history.  Social History:  reports that she has quit smoking. She does not have any smokeless tobacco history on file. She reports that she drinks alcohol. Her drug history is not on file.  Allergies:  Allergies  Allergen Reactions  . Penicillins Anaphylaxis    Has patient had a PCN reaction causing immediate rash, facial/tongue/throat swelling, SOB or lightheadedness with hypotension: Yes Has patient had a PCN reaction causing severe rash involving mucus membranes or skin necrosis: No Has patient had a PCN reaction that required hospitalization No Has patient had a PCN reaction occurring within the last 10 years: No If all of the above  answers are "NO", then may proceed with Cephalosporin use.  Marland Kitchen Percocet [Oxycodone-Acetaminophen] Rash    Medications:  Prior to Admission:  Prescriptions prior to admission  Medication Sig Dispense Refill Last Dose  . insulin aspart protamine - aspart (NOVOLOG MIX 70/30 FLEXPEN) (70-30) 100 UNIT/ML FlexPen Inject 15 Units into the skin 2 (two) times daily.   02/16/2015 at Unknown time    Results for orders placed or performed during the hospital encounter of 02/17/15 (from the past 48 hour(s))  Urinalysis complete, with microscopic ([facility] only)     Status: Abnormal   Collection Time: 02/17/15  6:41 AM  Result Value Ref Range   Color, Urine YELLOW (A) YELLOW   APPearance HAZY (A) CLEAR   Glucose, UA >500 (A) NEGATIVE mg/dL   Bilirubin Urine NEGATIVE NEGATIVE   Ketones, ur 2+ (A) NEGATIVE mg/dL   Specific Gravity, Urine 1.016 1.005 - 1.030   Hgb urine dipstick 2+ (A) NEGATIVE   pH 6.0 5.0 - 8.0   Protein, ur >500 (A) NEGATIVE mg/dL   Nitrite NEGATIVE NEGATIVE   Leukocytes, UA 3+ (A) NEGATIVE   RBC / HPF TOO NUMEROUS TO COUNT 0 - 5 RBC/hpf   WBC, UA TOO NUMEROUS TO COUNT 0 - 5 WBC/hpf   Bacteria, UA RARE (A) NONE SEEN   Squamous Epithelial / LPF 0-5 (A) NONE SEEN   WBC Clumps PRESENT    Mucous PRESENT   Lipase, blood     Status: Abnormal   Collection Time: 02/17/15  6:54 AM  Result Value Ref Range   Lipase 642 (H) 11 - 51 U/L    Comment: RESULT CONFIRMED BY MANUAL DILUTION  Comprehensive metabolic panel     Status: Abnormal   Collection Time: 02/17/15  6:54 AM  Result Value Ref Range   Sodium 139 135 - 145 mmol/L   Potassium 4.3 3.5 - 5.1 mmol/L   Chloride 88 (L) 101 - 111 mmol/L   CO2 10 (L) 22 - 32 mmol/L   Glucose, Bld 641 (HH) 65 - 99 mg/dL    Comment: CRITICAL RESULT CALLED TO, READ BACK BY AND VERIFIED WITH FELICIA STAROPOLI 13/08/65 0820 SJL    BUN 39 (H) 6 - 20 mg/dL   Creatinine, Ser 1.99 (H) 0.44 - 1.00 mg/dL   Calcium 9.2 8.9 - 10.3 mg/dL   Total Protein 9.5  (H) 6.5 - 8.1 g/dL   Albumin 4.4 3.5 - 5.0 g/dL   AST 22 15 - 41 U/L   ALT 20 14 - 54 U/L   Alkaline Phosphatase 101 38 - 126 U/L   Total Bilirubin 2.2 (H) 0.3 - 1.2 mg/dL   GFR calc non Af Amer 26 (L) >60 mL/min   GFR calc Af Amer 31 (L) >60 mL/min    Comment: (NOTE) The eGFR has been calculated using the CKD EPI equation. This calculation has not been validated in all clinical situations. eGFR's persistently <60 mL/min signify possible Chronic Kidney Disease.    Anion gap 41 (H) 5 - 15  CBC     Status: Abnormal   Collection Time: 02/17/15  6:54 AM  Result Value Ref Range   WBC 11.6 (H) 3.6 - 11.0 K/uL   RBC 4.68 3.80 - 5.20 MIL/uL   Hemoglobin 14.1 12.0 - 16.0 g/dL   HCT 44.2 35.0 - 47.0 %   MCV 94.6 80.0 - 100.0 fL   MCH 30.1 26.0 - 34.0 pg   MCHC 31.8 (L) 32.0 - 36.0 g/dL   RDW 13.0 11.5 - 14.5 %   Platelets 243 150 - 440 K/uL  Troponin I     Status: None   Collection Time: 02/17/15  6:54 AM  Result Value Ref Range   Troponin I <0.03 <0.031 ng/mL    Comment:        NO INDICATION OF MYOCARDIAL INJURY.   Blood gas, venous     Status: Abnormal   Collection Time: 02/17/15  8:31 AM  Result Value Ref Range   FIO2 PENDING    Delivery systems PENDING    pH, Ven 7.14 (LL) 7.320 - 7.430    Comment: CORRECTED ON 02/12 AT 7846: PREVIOUSLY REPORTED AS 7.14 CRITICAL RESULT CALLED TO, READ BACK BY AND VERIFIED WITH: FELICIA STAROPOLI RN AT 0920 ON [id number]   pCO2, Ven 27 (L) 44.0 - 60.0 mmHg   pO2, Ven PENDING 30.0 - 45.0 mmHg   Bicarbonate 9.2 (L) 21.0 - 28.0 mEq/L   Acid-base deficit 18.3 (H) 0.0 - 2.0 mmol/L   O2 Saturation PENDING %   Patient temperature 37.0    Collection site PENDING    Sample type PENDING   Glucose, capillary     Status: Abnormal   Collection Time: 02/17/15 10:19 AM  Result Value Ref Range   Glucose-Capillary 456 (H) 65 - 99 mg/dL  Blood gas, venous     Status: Abnormal   Collection Time: 02/17/15 11:37 AM  Result Value Ref Range   FIO2 PENDING     Delivery systems PENDING    pH, Ven 7.35  7.320 - 7.430   pCO2, Ven 26 (L) 44.0 - 60.0 mmHg   pO2, Ven PENDING 30.0 - 45.0 mmHg   Bicarbonate 14.4 (L) 21.0 - 28.0 mEq/L   Acid-base deficit 9.5 (H) 0.0 - 2.0 mmol/L   O2 Saturation PENDING %   Patient temperature 37.0    Collection site VENOUS    Sample type PENDING   Basic metabolic panel     Status: Abnormal   Collection Time: 02/17/15 11:37 AM  Result Value Ref Range   Sodium 137 135 - 145 mmol/L    Comment: RESULTS VERIFIED BY REPEAT TESTING   Potassium 3.7 3.5 - 5.1 mmol/L   Chloride 103 101 - 111 mmol/L   CO2 13 (L) 22 - 32 mmol/L   Glucose, Bld 395 (H) 65 - 99 mg/dL   BUN 33 (H) 6 - 20 mg/dL   Creatinine, Ser 1.54 (H) 0.44 - 1.00 mg/dL   Calcium 7.3 (L) 8.9 - 10.3 mg/dL   GFR calc non Af Amer 36 (L) >60 mL/min   GFR calc Af Amer 42 (L) >60 mL/min    Comment: (NOTE) The eGFR has been calculated using the CKD EPI equation. This calculation has not been validated in all clinical situations. eGFR's persistently <60 mL/min signify possible Chronic Kidney Disease.    Anion gap 21 (H) 5 - 15  Glucose, capillary     Status: Abnormal   Collection Time: 02/17/15 11:42 AM  Result Value Ref Range   Glucose-Capillary 375 (H) 65 - 99 mg/dL  Glucose, capillary     Status: Abnormal   Collection Time: 02/17/15 12:52 PM  Result Value Ref Range   Glucose-Capillary 307 (H) 65 - 99 mg/dL  Basic metabolic panel     Status: Abnormal   Collection Time: 02/17/15  1:31 PM  Result Value Ref Range   Sodium 142 135 - 145 mmol/L   Potassium 3.4 (L) 3.5 - 5.1 mmol/L   Chloride 111 101 - 111 mmol/L   CO2 12 (L) 22 - 32 mmol/L   Glucose, Bld 280 (H) 65 - 99 mg/dL   BUN 30 (H) 6 - 20 mg/dL   Creatinine, Ser 1.21 (H) 0.44 - 1.00 mg/dL   Calcium 7.2 (L) 8.9 - 10.3 mg/dL   GFR calc non Af Amer 48 (L) >60 mL/min   GFR calc Af Amer 56 (L) >60 mL/min    Comment: (NOTE) The eGFR has been calculated using the CKD EPI equation. This calculation has  not been validated in all clinical situations. eGFR's persistently <60 mL/min signify possible Chronic Kidney Disease.    Anion gap 19 (H) 5 - 15  Glucose, capillary     Status: Abnormal   Collection Time: 02/17/15  2:00 PM  Result Value Ref Range   Glucose-Capillary 231 (H) 65 - 99 mg/dL  Glucose, capillary     Status: Abnormal   Collection Time: 02/17/15  3:00 PM  Result Value Ref Range   Glucose-Capillary 196 (H) 65 - 99 mg/dL  Glucose, capillary     Status: Abnormal   Collection Time: 02/17/15  3:30 PM  Result Value Ref Range   Glucose-Capillary 190 (H) 65 - 99 mg/dL  Glucose, capillary     Status: Abnormal   Collection Time: 02/17/15  4:14 PM  Result Value Ref Range   Glucose-Capillary 187 (H) 65 - 99 mg/dL  Glucose, capillary     Status: Abnormal   Collection Time: 02/17/15  5:14 PM  Result Value Ref Range  Glucose-Capillary 147 (H) 65 - 99 mg/dL    Ct Abdomen Pelvis W Contrast  02/17/2015  CLINICAL DATA:  60 year old female with acute abdominal pain and vomiting for 1 day. EXAM: CT ABDOMEN AND PELVIS WITH CONTRAST TECHNIQUE: Multidetector CT imaging of the abdomen and pelvis was performed using the standard protocol following bolus administration of intravenous contrast. CONTRAST:  29m OMNIPAQUE IOHEXOL 300 MG/ML  SOLN COMPARISON:  None. FINDINGS: Lower chest:  Unremarkable Hepatobiliary: Hepatic steatosis identified without focal hepatic abnormality. The gallbladder is unremarkable. There is no evidence of biliary dilatation. Pancreas: There is equivocal stranding/ inflammation along the pancreatic tail. The pancreas is otherwise unremarkable. Spleen: Unremarkable Adrenals/Urinary Tract: The bladder is distended containing gas. Gas along the walls of the bladder also identified-compatible with emphysematous cystitis. There is mild enhancement of the ureteral walls bilaterally likely representing infection. There is no evidence of renal mass, abscess, hydronephrosis or gas within  the kidneys. The adrenal glands are unremarkable. Stomach/Bowel: Mild circumferential wall thickening of the distal esophagus likely represents esophagitis. There is no evidence of bowel obstruction or other definite areas of bowel wall thickening. Colonic diverticulosis noted. Vascular/Lymphatic: Aortic atherosclerotic calcifications noted without aneurysm. No enlarged lymph nodes identified. Reproductive: The patient is status post hysterectomy. There is no evidence of adnexal mass. Other:  No free fluid, abscess or pneumoperitoneum Musculoskeletal: No acute or suspicious abnormalities. IMPRESSION: Emphysematous cystitis. Equivocal stranding/inflammation along the pancreatic tail which could represent mild pancreatitis. Mild distal esophagitis. Hepatic steatosis. Aortic atherosclerosis. Electronically Signed   By: JMargarette CanadaM.D.   On: 02/17/2015 11:03    Review of Systems  Constitutional: Negative for fever and chills.   Reviewed from Admission HP CONSTITUTIONAL: No fever, reporting fatigue /Arneta Cliche  EYES: No blurred or double vision.  EARS, NOSE, AND THROAT: No tinnitus or ear pain.  RESPIRATORY: No cough, shortness of breath, wheezing or hemoptysis.  CARDIOVASCULAR: No chest pain, orthopnea, edema.  GASTROINTESTINAL: Reporting nausea, vomiting, diarrhea and generalized abdominal pain for 2 days GENITOURINARY: No dysuria, hematuria.  ENDOCRINE: No polyuria, nocturia,  HEMATOLOGY: No anemia, easy bruising or bleeding SKIN: No rash or lesion. MUSCULOSKELETAL: No joint pain or arthritis.  NEUROLOGIC: No tingling, numbness, weakness.  PSYCHIATRY: No anxiety or depression.   Blood pressure 134/91, pulse 97, temperature 98.5 F (36.9 C), temperature source Oral, resp. rate 26, height 5' 1"  (1.549 m), weight 48.1 kg (106 lb 0.7 oz), SpO2 100 %. Physical Exam  Constitutional: She is oriented to person, place, and time. She appears well-developed and well-nourished.  HENT:  Head:  Normocephalic and atraumatic.  Eyes: Conjunctivae are normal. Pupils are equal, round, and reactive to light. Left eye exhibits no discharge. No scleral icterus.  Neck: Normal range of motion. Neck supple. No tracheal deviation present. No thyromegaly present.  Cardiovascular: Normal rate and regular rhythm.   Respiratory: Effort normal and breath sounds normal. No respiratory distress. She has no wheezes.  GI: Soft. Bowel sounds are normal. She exhibits no distension. There is no tenderness. There is no rebound and no guarding.  Musculoskeletal: Normal range of motion.  Neurological: She is alert and oriented to person, place, and time.  Skin: Skin is warm. No rash noted. No erythema.  Psychiatric: She has a normal mood and affect.  GU: Normal external genitalia.  The introitus is small.  No other abnormalities.  Procedure  Under sterile technique, a 16 Fr. Foley catheter was placed in the bladder without difficulty.  600 cc of clear yellow urine was evacuated. At  the end of evacuation, gas did appear to drain out of the bladder (see as bubbles in the drainage tubing)  Assessment/Plan: I have discuss her care with the ER physician I have reviewed her images and I have reviewed these with her.   60 y.o. female with diabetes diagnosed with DKA, pancreatitis, and emphysematous cystitis. I was able to easily place a foley catheter. We discussed management of emphysematous cystitis including bladder decompression, IV antibiotics, and management of her diabetes.  I have made sure a culture was sent for analysis.  The antibiotics should be tailored based on sensitivities.  I do recommend foley drainage for 1 week given that in some cases of emphysematous cystitis, bladder perforation can occur with over distention. On her CT, she does appear to hold a lot of urine in her bladder and I suspect she may have a poorly contractile bladder secondary to her diabetes. This warrants further evaluation in the  outpatient setting.  I will arrange for her to be seen at [facility] in 1 week for trial of void. She may benefit from urodynamics in the near future.    Karen [name] 02/17/2015, 6:09 PM

## 2015-02-17 NOTE — ED Provider Notes (Addendum)
[facility] Emergency Department Provider Note  ____________________________________________  Time seen: Approximately 7:08 AM  I have reviewed the triage vital signs and the nursing notes.   HISTORY  Chief Complaint Abdominal Pain; Emesis; and Dizziness    HPI LORIEN PARSLOW is a 60 y.o. female history of DM, S/P appendectomy,presenting with 2 days of nausea and vomiting, loose stool, and diffuse abdominal discomfort. Patient reports that since yesterday she has been unable to tolerate anything by mouth. She has had 2 loose, nonbloody stools. She has diffuse abdominal pain without focality. She has also been experiencing intermittent episodes of chest "tightness" that are not associated with shortness of breath, palpitations, or diaphoresis. These occur only during episodes of vomiting. No fevers or chills, urinary symptoms. Eating or drinking makes her symptoms worse.   Past Medical History  Diagnosis Date  . Diabetes mellitus without complication ([place])   . Hypercholesteremia     There are no active problems to display for this patient.   Past Surgical History  Procedure Laterality Date  . Abdominal hysterectomy    . Appendectomy    . Thyroid surgery      No current outpatient prescriptions on file.  Allergies Penicillins and Percocet  History reviewed. No pertinent family history.  Social History Social History  Substance Use Topics  . Smoking status: Former Research scientist (life sciences)  . Smokeless tobacco: None  . Alcohol Use: Yes     Comment: once weekly    Review of Systems Constitutional: No fever/chills. No lightheadedness or syncope. Eyes: No visual changes. ENT: Positive sore throat during vomiting. Cardiovascular: Positive chest tightness without palpitations. Respiratory: Denies shortness of breath.  No cough. Gastrointestinal: Positive diffuse abdominal pain.  Positive nausea, positive vomiting.  Positive diarrhea.  No  constipation. Genitourinary: Negative for dysuria. Musculoskeletal: Negative for back pain. Skin: Negative for rash. Neurological: Negative for headaches, focal weakness or numbness.  10-point ROS otherwise negative.  ____________________________________________   PHYSICAL EXAM:  VITAL SIGNS: ED Triage Vitals  Enc Vitals Group     BP 02/17/15 0634 164/79 mmHg     Pulse Rate 02/17/15 0634 116     Resp 02/17/15 0634 20     Temp 02/17/15 0634 97.5 F (36.4 C)     Temp Source 02/17/15 0634 Oral     SpO2 02/17/15 0634 100 %     Weight 02/17/15 0634 109 lb (49.442 kg)     Height 02/17/15 0634 5\' 1"  (1.549 m)     Head Cir --      Peak Flow --      Pain Score 02/17/15 0637 5     Pain Loc --      Pain Edu? --      Excl. in [place]? --     Constitutional: Patient is alert and oriented and answering questions appropriately. She is in no acute distress but appears mildly uncomfortable.  Eyes: Conjunctivae are normal.  EOMI. no scleral icterus. Head: Atraumatic. Nose: No congestion/rhinnorhea. Mouth/Throat: Mucous membranes are dry.  Neck: No stridor.  Supple.  No JVD. Cardiovascular: Fast rate, regular rhythm. Mild holosystolic murmurs, without rubs or gallops.  Respiratory: Normal respiratory effort.  No retractions. Lungs CTAB.  No wheezes, rales or ronchi. Gastrointestinal: Abdomen is soft and nondistended. Patient is diffusely tender to palpation without focality. No rebound or guarding, peritoneal signs. Negative Murphy sign. Musculoskeletal: No LE edema.  Neurologic:  Normal speech and language. No gross focal neurologic deficits are appreciated.  Skin:  Skin is warm,  dry and intact. No rash noted. Psychiatric: Mood and affect are normal. Speech and behavior are normal.  Normal judgement.  ____________________________________________   LABS (all labs ordered are listed, but only abnormal results are displayed)  Labs Reviewed  LIPASE, BLOOD - Abnormal; Notable for the  following:    Lipase 642 (*)    All other components within normal limits  COMPREHENSIVE METABOLIC PANEL - Abnormal; Notable for the following:    Chloride 88 (*)    CO2 10 (*)    Glucose, Bld 641 (*)    BUN 39 (*)    Creatinine, Ser 1.99 (*)    Total Protein 9.5 (*)    Total Bilirubin 2.2 (*)    GFR calc non Af Amer 26 (*)    GFR calc Af Amer 31 (*)    Anion gap 41 (*)    All other components within normal limits  CBC - Abnormal; Notable for the following:    WBC 11.6 (*)    MCHC 31.8 (*)    All other components within normal limits  URINALYSIS COMPLETEWITH MICROSCOPIC ([facility] ONLY) - Abnormal; Notable for the following:    Color, Urine YELLOW (*)    APPearance HAZY (*)    Glucose, UA >500 (*)    Ketones, ur 2+ (*)    Hgb urine dipstick 2+ (*)    Protein, ur >500 (*)    Leukocytes, UA 3+ (*)    Bacteria, UA RARE (*)    Squamous Epithelial / LPF 0-5 (*)    All other components within normal limits  BLOOD GAS, VENOUS - Abnormal; Notable for the following:    pH, Ven 7.14 (*)    pCO2, Ven 27 (*)    Bicarbonate 9.2 (*)    Acid-base deficit 18.3 (*)    All other components within normal limits  GLUCOSE, CAPILLARY - Abnormal; Notable for the following:    Glucose-Capillary 456 (*)    All other components within normal limits  BLOOD GAS, VENOUS - Abnormal; Notable for the following:    pCO2, Ven 26 (*)    Bicarbonate 14.4 (*)    Acid-base deficit 9.5 (*)    All other components within normal limits  BASIC METABOLIC PANEL - Abnormal; Notable for the following:    CO2 13 (*)    Glucose, Bld 395 (*)    BUN 33 (*)    Creatinine, Ser 1.54 (*)    Calcium 7.3 (*)    GFR calc non Af Amer 36 (*)    GFR calc Af Amer 42 (*)    Anion gap 21 (*)    All other components within normal limits  GLUCOSE, CAPILLARY - Abnormal; Notable for the following:    Glucose-Capillary 375 (*)    All other components within normal limits  GLUCOSE, CAPILLARY - Abnormal; Notable for the following:     Glucose-Capillary 307 (*)    All other components within normal limits  URINE CULTURE  TROPONIN I  BASIC METABOLIC PANEL   ____________________________________________  EKG  ED ECG REPORT I, Eula Listen, the attending physician, personally viewed and interpreted this ECG.   Date: 02/17/2015  EKG Time: 659  Rate: 110  Rhythm: sinus tachycardia  Axis: Normal  Intervals:none  ST&T Change: 0.5 mm ST elevation in V1 and V2. No STEMI.  ____________________________________________  RADIOLOGY  Ct Abdomen Pelvis W Contrast  02/17/2015  CLINICAL DATA:  60 year old female with acute abdominal pain and vomiting for 1 day. EXAM: CT ABDOMEN AND  PELVIS WITH CONTRAST TECHNIQUE: Multidetector CT imaging of the abdomen and pelvis was performed using the standard protocol following bolus administration of intravenous contrast. CONTRAST:  69mL OMNIPAQUE IOHEXOL 300 MG/ML  SOLN COMPARISON:  None. FINDINGS: Lower chest:  Unremarkable Hepatobiliary: Hepatic steatosis identified without focal hepatic abnormality. The gallbladder is unremarkable. There is no evidence of biliary dilatation. Pancreas: There is equivocal stranding/ inflammation along the pancreatic tail. The pancreas is otherwise unremarkable. Spleen: Unremarkable Adrenals/Urinary Tract: The bladder is distended containing gas. Gas along the walls of the bladder also identified-compatible with emphysematous cystitis. There is mild enhancement of the ureteral walls bilaterally likely representing infection. There is no evidence of renal mass, abscess, hydronephrosis or gas within the kidneys. The adrenal glands are unremarkable. Stomach/Bowel: Mild circumferential wall thickening of the distal esophagus likely represents esophagitis. There is no evidence of bowel obstruction or other definite areas of bowel wall thickening. Colonic diverticulosis noted. Vascular/Lymphatic: Aortic atherosclerotic calcifications noted without aneurysm. No  enlarged lymph nodes identified. Reproductive: The patient is status post hysterectomy. There is no evidence of adnexal mass. Other:  No free fluid, abscess or pneumoperitoneum Musculoskeletal: No acute or suspicious abnormalities. IMPRESSION: Emphysematous cystitis. Equivocal stranding/inflammation along the pancreatic tail which could represent mild pancreatitis. Mild distal esophagitis. Hepatic steatosis. Aortic atherosclerosis. Electronically Signed   By: Margarette Canada M.D.   On: 02/17/2015 11:03    ____________________________________________   PROCEDURES  Procedure(s) performed: None  Critical Care performed: Yes ____________________________________________   INITIAL IMPRESSION / ASSESSMENT AND PLAN / ED COURSE  Pertinent labs & imaging results that were available during my care of the patient were reviewed by me and considered in my medical decision making (see chart for details).  60 y.o. female with DM 2, presenting with 2 days of nausea vomiting, loose stool, diffuse abdominal pain. On my exam, the patient appears dehydrated with dry mucous membranes, and associated tachycardia. She is maintaining her blood pressure and is mentating well, but will require fluid resuscitation. She likely has a viral GI illness or flulike illness, as I do not palpate any focal areas of her abdomen that are tender. If her labs are grossly abnormal, we will consider imaging but there is no imaging required at this time. I am concerned that the patient has had intermittent chest tightness and has some minimal ST changes on her EKG. As her tachycardia improves with rehydration, we will repeat an EKG to reevaluate this. I have also sent a troponin and we'll likely send a second troponin and 3 hours. The patient has no known CAD, but it is possible that in the setting of dehydration, that her heart may be under some strain.  ----------------------------------------- 8:32 AM on  02/17/2015 -----------------------------------------  The patient has a urinary tract infection which I will treat with ciprofloxacin, she has anaphylaxis to penicillins and I am concerned about cross-reactivity with Rocephin. She has a blood sugar greater than 600 with an anion gap of 41 so I will get a VBG and start her on an insulin drip for DKA. I have ordered a CT of her abdomen to make sure she doesn't have any additional pathology even the vomiting and diarrhea as well as diffuse abdominal pain. The patient will require admission.  CRITICAL CARE Performed by: Eula Listen   Total critical care time: 55 minutes  Critical care time was exclusive of separately billable procedures and treating other patients.  Critical care was necessary to treat or prevent imminent or life-threatening deterioration.  Critical care was  time spent personally by me on the following activities: development of treatment plan with patient and/or surrogate as well as nursing, discussions with consultants, evaluation of patient's response to treatment, examination of patient, obtaining history from patient or surrogate, ordering and performing treatments and interventions, ordering and review of laboratory studies, ordering and review of radiographic studies, pulse oximetry and re-evaluation of patient's condition.  ----------------------------------------- 11:42 AM on 02/17/2015 -----------------------------------------  The patient continues to improve symptomatically. Her nausea has resolved. She feels distended in the abdomen overall is looking better. Her heart rate is down to the low 100s and she is maintaining her blood pressure well. Her CT scan and lab results are consistent with acute pancreatitis, as well as acute urinary tract infection. These are both likely driving her DKA. Unfortunately, there are no ICU or stepdown beds remaining at [facility], so I have spoken with the patient and we are  requesting a bed transfer to [facility]. I will also repeat her gas and BMP to reevaluate her DKA.  ----------------------------------------- 12:20 PM on 02/17/2015 -----------------------------------------  I have tried calling both  in [place], neither of which have beds to accommodate this patient. I'm currently in discussion with [facility] for transfer.  ----------------------------------------- 12:35 PM on 02/17/2015 -----------------------------------------  I'm still waiting to hear back from [facility] for transfer. The patient's repeat VBG does show an improvement in her pH and she is now 7.35. I am awaiting her basic metabolic panel for evaluation of her gap.  ----------------------------------------- 1:48 PM on 02/17/2015 -----------------------------------------  The patient has been accepted for transfer at [facility].  ----------------------------------------- 2:50 PM on 02/17/2015 -----------------------------------------  The patient continues to remain stable. A transfer at [facility] has an unclear time frame and an ICU bed has become available at [facility] so plan to admit the patient here. Patient is in agreement with the plan.  ____________________________________________  FINAL CLINICAL IMPRESSION(S) / ED DIAGNOSES  Final diagnoses:  Diabetic ketoacidosis without coma associated with type 2 diabetes mellitus ([place])  Acute renal failure, unspecified acute renal failure type ([place])  Acute pancreatitis, unspecified pancreatitis type  Sepsis, due to unspecified organism [facility])      NEW MEDICATIONS STARTED DURING THIS VISIT:  New Prescriptions   No medications on file     Eula Listen, MD 02/17/15 1348  Eula Listen, MD 02/17/15 1451

## 2015-02-17 NOTE — ED Notes (Signed)
Ph 7.14. MD notified

## 2015-02-17 NOTE — ED Notes (Signed)
Pt c/o N/V since yesterday morning; tightness across middle of abdomen; chest pain only when vomiting;

## 2015-02-17 NOTE — Progress Notes (Signed)
Anion gap 13; CO2 18; FSBG 118 at 1945.  MD paged for conversion orders.  Per Dr. Margaretmary Eddy, keep patient on insulin drip for another 4 hours and re-evaluate as patient initially presented with severe DKA and an anion gap of 40.  Will continue insulin drip with hourly accu-checks for now.  Pt also still complaining of nausea and vomitting; zofran given at 1800.  Orders received for PRN reglan.  Will continue to assess and monitor.

## 2015-02-17 NOTE — ED Notes (Signed)
Pt reports abd pain and emesis x 1 day, reports emesis bilious w/ some coffee ground consistency. Pt also reports diarrhea x 2 yesterday.  Pt reports upper abd pain, described as tight, cramping, throbbing.  Pt NAD at this time, respirations equal and unlabored, skin warm and dry.

## 2015-02-18 ENCOUNTER — Telehealth: Payer: Self-pay

## 2015-02-18 LAB — BASIC METABOLIC PANEL
ANION GAP: 5 (ref 5–15)
BUN: 19 mg/dL (ref 6–20)
CO2: 23 mmol/L (ref 22–32)
Calcium: 7 mg/dL — ABNORMAL LOW (ref 8.9–10.3)
Chloride: 114 mmol/L — ABNORMAL HIGH (ref 101–111)
Creatinine, Ser: 0.86 mg/dL (ref 0.44–1.00)
GFR calc Af Amer: >60 mL/min (ref >60)
GFR calc non Af Amer: >60 mL/min (ref >60)
GLUCOSE: 172 mg/dL — AB (ref 65–99)
POTASSIUM: 3.4 mmol/L — AB (ref 3.5–5.1)
Sodium: 142 mmol/L (ref 135–145)

## 2015-02-18 LAB — COMPREHENSIVE METABOLIC PANEL
ALBUMIN: 2.7 g/dL — AB (ref 3.5–5.0)
ALK PHOS: 56 U/L (ref 38–126)
ALT: 12 U/L — ABNORMAL LOW (ref 14–54)
ANION GAP: 4 — AB (ref 5–15)
AST: 14 U/L — ABNORMAL LOW (ref 15–41)
BUN: 22 mg/dL — ABNORMAL HIGH (ref 6–20)
CALCIUM: 7 mg/dL — AB (ref 8.9–10.3)
CO2: 24 mmol/L (ref 22–32)
Chloride: 114 mmol/L — ABNORMAL HIGH (ref 101–111)
Creatinine, Ser: 0.92 mg/dL (ref 0.44–1.00)
GFR calc non Af Amer: >60 mL/min (ref >60)
GLUCOSE: 156 mg/dL — AB (ref 65–99)
POTASSIUM: 3 mmol/L — AB (ref 3.5–5.1)
SODIUM: 142 mmol/L (ref 135–145)
TOTAL PROTEIN: 6.1 g/dL — AB (ref 6.5–8.1)
Total Bilirubin: 0.9 mg/dL (ref 0.3–1.2)

## 2015-02-18 LAB — LIPID PANEL
CHOL/HDL RATIO: 7.3 ratio
Cholesterol: 360 mg/dL — ABNORMAL HIGH (ref 0–200)
HDL: 49 mg/dL (ref >40)
LDL Cholesterol: 283 mg/dL — ABNORMAL HIGH (ref 0–99)
Triglycerides: 139 mg/dL (ref <150)
VLDL: 28 mg/dL (ref 0–40)

## 2015-02-18 LAB — GLUCOSE, CAPILLARY
GLUCOSE-CAPILLARY: 134 mg/dL — AB (ref 65–99)
GLUCOSE-CAPILLARY: 160 mg/dL — AB (ref 65–99)
GLUCOSE-CAPILLARY: 171 mg/dL — AB (ref 65–99)
GLUCOSE-CAPILLARY: 211 mg/dL — AB (ref 65–99)
GLUCOSE-CAPILLARY: 280 mg/dL — AB (ref 65–99)
Glucose-Capillary: 115 mg/dL — ABNORMAL HIGH (ref 65–99)
Glucose-Capillary: 126 mg/dL — ABNORMAL HIGH (ref 65–99)
Glucose-Capillary: 144 mg/dL — ABNORMAL HIGH (ref 65–99)
Glucose-Capillary: 154 mg/dL — ABNORMAL HIGH (ref 65–99)
Glucose-Capillary: 184 mg/dL — ABNORMAL HIGH (ref 65–99)
Glucose-Capillary: 217 mg/dL — ABNORMAL HIGH (ref 65–99)

## 2015-02-18 LAB — CBC
HEMATOCRIT: 31.5 % — AB (ref 35.0–47.0)
HEMOGLOBIN: 10.7 g/dL — AB (ref 12.0–16.0)
MCH: 30.4 pg (ref 26.0–34.0)
MCHC: 33.8 g/dL (ref 32.0–36.0)
MCV: 90 fL (ref 80.0–100.0)
Platelets: 168 10*3/uL (ref 150–440)
RBC: 3.5 MIL/uL — AB (ref 3.80–5.20)
RDW: 12.2 % (ref 11.5–14.5)
WBC: 12.1 10*3/uL — ABNORMAL HIGH (ref 3.6–11.0)

## 2015-02-18 LAB — HEMOGLOBIN A1C: HEMOGLOBIN A1C: 11.7 % — AB (ref 4.0–6.0)

## 2015-02-18 LAB — TSH: TSH: 2.436 u[IU]/mL (ref 0.350–4.500)

## 2015-02-18 LAB — LIPASE, BLOOD: Lipase: 373 U/L — ABNORMAL HIGH (ref 11–51)

## 2015-02-18 MED ORDER — SIMVASTATIN 40 MG PO TABS
40.0000 mg | ORAL_TABLET | Freq: Every day | ORAL | Status: DC
  Administered 2015-02-18: 17:00:00 40 mg via ORAL
  Filled 2015-02-18: qty 1

## 2015-02-18 MED ORDER — INSULIN ASPART 100 UNIT/ML ~~LOC~~ SOLN
0.0000 [IU] | Freq: Three times a day (TID) | SUBCUTANEOUS | Status: DC
  Administered 2015-02-18: 3 [IU] via SUBCUTANEOUS
  Administered 2015-02-18 (×2): 5 [IU] via SUBCUTANEOUS
  Administered 2015-02-19: 09:00:00 2 [IU] via SUBCUTANEOUS
  Administered 2015-02-19: 3 [IU] via SUBCUTANEOUS
  Filled 2015-02-18 (×2): qty 3
  Filled 2015-02-18: qty 5
  Filled 2015-02-18: qty 2
  Filled 2015-02-18: qty 5

## 2015-02-18 MED ORDER — INFLUENZA VAC SPLIT QUAD 0.5 ML IM SUSY
0.5000 mL | PREFILLED_SYRINGE | INTRAMUSCULAR | Status: AC
  Administered 2015-02-19: 12:00:00 0.5 mL via INTRAMUSCULAR
  Filled 2015-02-18: qty 0.5

## 2015-02-18 MED ORDER — INSULIN GLARGINE 100 UNIT/ML ~~LOC~~ SOLN
5.0000 [IU] | SUBCUTANEOUS | Status: DC
  Administered 2015-02-18: 5 [IU] via SUBCUTANEOUS
  Filled 2015-02-18 (×2): qty 0.05

## 2015-02-18 MED ORDER — GLUCERNA PO LIQD
237.0000 mL | Freq: Three times a day (TID) | ORAL | Status: DC
  Administered 2015-02-18 – 2015-02-19 (×2): 237 mL via ORAL

## 2015-02-18 MED ORDER — SODIUM CHLORIDE 0.9 % IV SOLN
INTRAVENOUS | Status: DC
  Administered 2015-02-18 (×2): via INTRAVENOUS

## 2015-02-18 MED ORDER — INSULIN GLARGINE 100 UNIT/ML ~~LOC~~ SOLN
5.0000 [IU] | Freq: Two times a day (BID) | SUBCUTANEOUS | Status: DC
  Administered 2015-02-18 – 2015-02-19 (×2): 5 [IU] via SUBCUTANEOUS
  Filled 2015-02-18 (×3): qty 0.05

## 2015-02-18 MED ORDER — INSULIN ASPART 100 UNIT/ML ~~LOC~~ SOLN
0.0000 [IU] | Freq: Every day | SUBCUTANEOUS | Status: DC
  Administered 2015-02-18: 2 [IU] via SUBCUTANEOUS
  Filled 2015-02-18: qty 2

## 2015-02-18 MED ORDER — KCL IN DEXTROSE-NACL 20-5-0.45 MEQ/L-%-% IV SOLN
INTRAVENOUS | Status: AC
  Administered 2015-02-18: 04:00:00 via INTRAVENOUS
  Filled 2015-02-18: qty 1000

## 2015-02-18 NOTE — Progress Notes (Signed)
Called Dr. Jannifer Franklin r/t pt.'s last CMP results and current CBG and insulin gtt level.  MD put in orders for transition.

## 2015-02-18 NOTE — Care Management Note (Signed)
Case Management Note  Patient Details  Name: Karen [name] MRN: [id number] Date of Birth: [birth year]  Subjective/Objective:                  Met with patient prior to transfer to 1C (while patient was in ICU). She states she lives with her boyfriend of 12 years. She states she is independent with mobility but has a cane if needed. She states her PCP is Dr. Brynda Greathouse  [phone]. I have left message for Dr. Jerene Dilling office to confirm (they were out to lunch). Patient states she uses insulin but does not have a glucometer and does not check her sugars. She requests Glucerna shakes to eat. She has noted that she did not take her insulin and has frequented ETOH use. I asked patient about this and she could not give me a straight answer as she states she has access to her Rx and medicaid covers costs. She denies need for home health services.   Action/Plan: I text Dr. Benjie Karvonen to request Rushie Goltz if available and Rx for Glucometer that patient can take to pharmacy to have filled. I will seek follow up with Dr. Brynda Greathouse when they call me back and note it on her discharge follow up.   Expected Discharge Date:                  Expected Discharge Plan:     In-House Referral:     Discharge planning Services  CM Consult  Post Acute Care Choice:  Durable Medical Equipment Choice offered to:  Patient  DME Arranged:  Glucometer DME Agency:     HH Arranged:    HH Agency:     Status of Service:  In process, will continue to follow  Medicare Important Message Given:    Date Medicare IM Given:    Medicare IM give by:    Date Additional Medicare IM Given:    Additional Medicare Important Message give by:     If discussed at [place] of Stay Meetings, dates discussed:    Additional Comments:  Marshell Garfinkel, RN 02/18/2015, 1:22 PM

## 2015-02-18 NOTE — Progress Notes (Signed)
Inpatient Diabetes Program Recommendations  AACE/ADA: New Consensus Statement on Inpatient Glycemic Control (2015)  Target Ranges:  Prepandial:   less than 140 mg/dL      Peak postprandial:   less than 180 mg/dL (1-2 hours)      Critically ill patients:  140 - 180 mg/dL   Results for TARISA, RADI (MRN [id number]) as of 02/18/2015 15:16  Ref. Range 02/18/2015 01:01 02/18/2015 02:04 02/18/2015 03:04 02/18/2015 04:04 02/18/2015 05:07 02/18/2015 06:06 02/18/2015 07:41 02/18/2015 11:17  Glucose-Capillary Latest Ref Range: 65-99 mg/dL 154 (H) 134 (H) 115 (H) 126 (H) 144 (H) 160 (H) 171 (H) 211 (H)  Results for ADITHI, POPKO (MRN [id number]) as of 02/18/2015 15:16  Ref. Range 02/17/2015 06:54  Glucose Latest Ref Range: 65-99 mg/dL 641 (HH)   Review of Glycemic Control   Outpatient Diabetes medications: 70/30 15 units BID Current orders for Inpatient glycemic control: Lantus 5 units Q24H, Novolog 0-9 untis TID with meals, Novolog 0-5 units HS  Inpatient Diabetes Program Recommendations: Insulin - Basal: Patient received Lantus 5 units at 3:44 am on 02/18/15 at time of transition from IV to SQ insulin. Please consider increasing Lantus to 5 units BID (based on 48 kg x 0.2 units). HgbA1C: A1C in process.  Spoke with patient about diabetes and home regimen for diabetes control. Patient reports that she is followed by her PCP (Dr. Brynda Greathouse) for diabetes management and currently she takes 70/30 15 units BID with meals as an outpatient for diabetes control. Patient states that she takes her 70/30 with breakfast and supper and she always eats when taking insulin.  Patient reports that she was taking insulin as prescribed until she got sick and was not eating. She reports that since she was not eating, she did not take any insulin. Explained to the patient that she is requiring insulin for glycemic control and that she needs to talk with her PCP about sick day rules as she may need to take a decreased amount of insulin or  be changed to a different type of insulin which she would take even if she is not eating.  Patient states that she is not checking her glucose because she does not have a glucometer at home.  Patient reports that when her "glucose is checked at my doctor's office it is in the 100's mg/dl sometimes and sometimes higher."  Inquired about prior A1C and patient reports that she does not know what A1C is. Explained what an A1C is and informed patient that an A1C has been drawn but it is still in process at this time.  Discussed glucose and A1C goals. Discussed importance of checking CBGs and maintaining good CBG control to prevent long-term and short-term complications. Explained how hyperglycemia leads to damage within blood vessels which lead to the common complications seen with uncontrolled diabetes. Stressed to the patient the importance of improving glycemic control to prevent further complications from uncontrolled diabetes. Discussed impact of nutrition, exercise, stress, sickness, and medications on diabetes control. Informed patient that a request for a prescription for a glucometer and testing supplies would be made to the attending MD. Advised patient to get a new glucometer and testing supplies and encouraged patient to check her glucose 4 times per day (before meals and at bedtime) and to keep a log book of glucose readings and insulin taken whichs he will need to take to doctor appointments. Explained how her doctor can use the log book to continue to make insulin adjustments if needed. Also  advised patient to talk with her PCP about sick day rules (how much insulin she should take when she is not able to eat; or if insulin needs to be changed to a different type of insulin).  Patient verbalized understanding of information discussed and she states that she has no further questions at this time related to diabetes.  Patient may benefit from changing from 70/30 to separate basal and bolus insulins (such  as Lantus and Novolog).   At time of discharge, please provide patient with a prescription for a glucometer and testing supplies.   Thanks, Barnie Alderman, RN, MSN, CDE Diabetes Coordinator Inpatient Diabetes Program [phone] (Team Pager) [phone] ([place] office) [phone] [facility] office) [phone] [facility] office)

## 2015-02-18 NOTE — Progress Notes (Signed)
Dr Benjie Karvonen- clear liquid diet, discontinue continuous pulse oximetry

## 2015-02-18 NOTE — Telephone Encounter (Signed)
-----   Message from Dereck Leep, MD sent at 02/17/2015  6:45 PM EST ----- Regarding: Follow up for trial of void Please schedule a follow up with a provider in 1 week for trial of void and discussion of bladder dysfunction.  Thanks Joe

## 2015-02-18 NOTE — Progress Notes (Signed)
[facility] [facility] [place] NAME: Karen [name]    MR#:  [id number]  DATE OF BIRTH:  1955-10-24  SUBJECTIVE:   Patient continues to have generalized abdominal pain. No nausea or vomiting.  REVIEW OF SYSTEMS:    Review of Systems  Constitutional: Negative for fever, chills and malaise/fatigue.  HENT: Negative for sore throat.   Eyes: Negative for blurred vision.  Respiratory: Negative for cough, hemoptysis, shortness of breath and wheezing.   Cardiovascular: Negative for chest pain, palpitations and leg swelling.  Gastrointestinal: Positive for abdominal pain. Negative for nausea, vomiting, diarrhea and blood in stool.  Genitourinary: Negative for dysuria.  Musculoskeletal: Negative for back pain.  Neurological: Negative for dizziness, tremors and headaches.  Endo/Heme/Allergies: Does not bruise/bleed easily.    Tolerating Diet: npo      DRUG ALLERGIES:   Allergies  Allergen Reactions  . Penicillins Anaphylaxis    Has patient had a PCN reaction causing immediate rash, facial/tongue/throat swelling, SOB or lightheadedness with hypotension: Yes Has patient had a PCN reaction causing severe rash involving mucus membranes or skin necrosis: No Has patient had a PCN reaction that required hospitalization No Has patient had a PCN reaction occurring within the last 10 years: No If all of the above answers are "NO", then may proceed with Cephalosporin use.  Marland Kitchen Percocet [Oxycodone-Acetaminophen] Rash    VITALS:  Blood pressure 128/77, pulse 80, temperature 98.1 F (36.7 C), temperature source Oral, resp. rate 19, height 5\' 1"  (1.549 m), weight 48.1 kg (106 lb 0.7 oz), SpO2 96 %.  PHYSICAL EXAMINATION:   Physical Exam  Constitutional: She is oriented to person, place, and time and well-developed, well-nourished, and in no distress. No distress.  HENT:  Head: Normocephalic.  Eyes: No scleral icterus.  Neck: Normal range of motion. Neck  supple. No JVD present. No tracheal deviation present.  Cardiovascular: Normal rate, regular rhythm and normal heart sounds.  Exam reveals no gallop and no friction rub.   No murmur heard. Pulmonary/Chest: Effort normal and breath sounds normal. No respiratory distress. She has no wheezes. She has no rales. She exhibits no tenderness.  Abdominal: Soft. Bowel sounds are normal. She exhibits no distension and no mass. There is no tenderness. There is no rebound and no guarding.  Musculoskeletal: Normal range of motion. She exhibits no edema.  Neurological: She is alert and oriented to person, place, and time.  Skin: Skin is warm. No rash noted. No erythema.  Psychiatric: Affect and judgment normal.      LABORATORY PANEL:   CBC  Recent Labs Lab 02/18/15 0127  WBC 12.1*  HGB 10.7*  HCT 31.5*  PLT 168   ------------------------------------------------------------------------------------------------------------------  Chemistries   Recent Labs Lab 02/18/15 0127 02/18/15 0607  NA 142 142  K 3.0* 3.4*  CL 114* 114*  CO2 24 23  GLUCOSE 156* 172*  BUN 22* 19  CREATININE 0.92 0.86  CALCIUM 7.0* 7.0*  AST 14*  --   ALT 12*  --   ALKPHOS 56  --   BILITOT 0.9  --    ------------------------------------------------------------------------------------------------------------------  Cardiac Enzymes  Recent Labs Lab 02/17/15 0654  TROPONINI <0.03   ------------------------------------------------------------------------------------------------------------------  RADIOLOGY:  Ct Abdomen Pelvis W Contrast  02/17/2015  CLINICAL DATA:  60 year old female with acute abdominal pain and vomiting for 1 day. EXAM: CT ABDOMEN AND PELVIS WITH CONTRAST TECHNIQUE: Multidetector CT imaging of the abdomen and pelvis was performed using the standard protocol following bolus administration of  intravenous contrast. CONTRAST:  50mL OMNIPAQUE IOHEXOL 300 MG/ML  SOLN COMPARISON:  None. FINDINGS:  Lower chest:  Unremarkable Hepatobiliary: Hepatic steatosis identified without focal hepatic abnormality. The gallbladder is unremarkable. There is no evidence of biliary dilatation. Pancreas: There is equivocal stranding/ inflammation along the pancreatic tail. The pancreas is otherwise unremarkable. Spleen: Unremarkable Adrenals/Urinary Tract: The bladder is distended containing gas. Gas along the walls of the bladder also identified-compatible with emphysematous cystitis. There is mild enhancement of the ureteral walls bilaterally likely representing infection. There is no evidence of renal mass, abscess, hydronephrosis or gas within the kidneys. The adrenal glands are unremarkable. Stomach/Bowel: Mild circumferential wall thickening of the distal esophagus likely represents esophagitis. There is no evidence of bowel obstruction or other definite areas of bowel wall thickening. Colonic diverticulosis noted. Vascular/Lymphatic: Aortic atherosclerotic calcifications noted without aneurysm. No enlarged lymph nodes identified. Reproductive: The patient is status post hysterectomy. There is no evidence of adnexal mass. Other:  No free fluid, abscess or pneumoperitoneum Musculoskeletal: No acute or suspicious abnormalities. IMPRESSION: Emphysematous cystitis. Equivocal stranding/inflammation along the pancreatic tail which could represent mild pancreatitis. Mild distal esophagitis. Hepatic steatosis. Aortic atherosclerosis. Electronically Signed   By: Margarette Canada M.D.   On: 02/17/2015 11:03     ASSESSMENT AND PLAN:   60 year old female with a history of diabetes, noncompliance of medications and EtOH abuse who presents with abdominal pain and elevated blood sugar.  1. DKA with severe anion gap metabolic acidosis: Patient was placed on DKA protocol. Patient's anion gap is closed and CO2 is appropriately elevated. She will continue on sliding scale insulin. Check hemoglobin A1c. Diabetes coordinator  consultation.  2. Acute EtOH related pancreatitis: Patient still complaining of abdominal pain. Patient should be nothing by mouth for now. Continue supportive care and IV fluids.  3. Emphysematous cystitis: Appreciate urology consultation. Continue Foley and ciprofloxacin. Patient needs a Foley in for 1 week. She will have follow-up with urology for voiding trial that time.  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  4. EtOH abuse: Continue CIWA protocol.  5. Hyperlipidemia: Start Zocor. LDL is 283. Triglycerides is 139. Cholesterol 360.  6. Hypokalemia: Replete and recheck in a.m. Management plans discussed with the patient and she is in agreement.  CODE STATUS: FULL  TOTAL TIME TAKING CARE OF THIS PATIENT: 30 minutes.   Transfer out to regular floor.  POSSIBLE D/C 1-2 days, DEPENDING ON CLINICAL CONDITION.   Antaniya Venuti M.D on 02/18/2015 at 11:39 AM  Between 7am to 6pm - Pager - [phone] After 6pm go to [email or web address] - password [email or web address] [facility]  Office  [phone]  CC: Primary care physician; Marden Noble, MD  Note: This dictation was prepared with Dragon dictation along with smaller phrase technology. Any transcriptional errors that result from this process are unintentional.

## 2015-02-19 LAB — CBC
HCT: 31.3 % — ABNORMAL LOW (ref 35.0–47.0)
HEMOGLOBIN: 10.8 g/dL — AB (ref 12.0–16.0)
MCH: 31.3 pg (ref 26.0–34.0)
MCHC: 34.4 g/dL (ref 32.0–36.0)
MCV: 90.9 fL (ref 80.0–100.0)
Platelets: 130 10*3/uL — ABNORMAL LOW (ref 150–440)
RBC: 3.45 MIL/uL — AB (ref 3.80–5.20)
RDW: 12.3 % (ref 11.5–14.5)
WBC: 7.5 10*3/uL (ref 3.6–11.0)

## 2015-02-19 LAB — GLUCOSE, CAPILLARY
GLUCOSE-CAPILLARY: 157 mg/dL — AB (ref 65–99)
Glucose-Capillary: 184 mg/dL — ABNORMAL HIGH (ref 65–99)
Glucose-Capillary: 211 mg/dL — ABNORMAL HIGH (ref 65–99)

## 2015-02-19 LAB — BASIC METABOLIC PANEL
ANION GAP: 14 (ref 5–15)
BUN: 11 mg/dL (ref 6–20)
CALCIUM: 6.8 mg/dL — AB (ref 8.9–10.3)
CHLORIDE: 105 mmol/L (ref 101–111)
CO2: 21 mmol/L — AB (ref 22–32)
Creatinine, Ser: 0.75 mg/dL (ref 0.44–1.00)
GFR calc non Af Amer: >60 mL/min (ref >60)
Glucose, Bld: 192 mg/dL — ABNORMAL HIGH (ref 65–99)
Potassium: 3.1 mmol/L — ABNORMAL LOW (ref 3.5–5.1)
SODIUM: 140 mmol/L (ref 135–145)

## 2015-02-19 LAB — URINE CULTURE

## 2015-02-19 LAB — LIPASE, BLOOD: LIPASE: 69 U/L — AB (ref 11–51)

## 2015-02-19 MED ORDER — POTASSIUM CHLORIDE CRYS ER 20 MEQ PO TBCR
40.0000 meq | EXTENDED_RELEASE_TABLET | Freq: Once | ORAL | Status: AC
  Administered 2015-02-19: 14:00:00 40 meq via ORAL
  Filled 2015-02-19: qty 2

## 2015-02-19 MED ORDER — PANTOPRAZOLE SODIUM 40 MG PO TBEC: 40.0000 mg | DELAYED_RELEASE_TABLET | ORAL | Status: DC

## 2015-02-19 MED ORDER — CIPROFLOXACIN HCL 500 MG PO TABS
500.0000 mg | ORAL_TABLET | Freq: Two times a day (BID) | ORAL | Status: DC
  Administered 2015-02-19: 12:00:00 500 mg via ORAL
  Filled 2015-02-19: qty 1

## 2015-02-19 MED ORDER — GLUCERNA PO LIQD: 237.0000 mL | Freq: Three times a day (TID) | ORAL | Status: DC

## 2015-02-19 MED ORDER — SIMVASTATIN 40 MG PO TABS: 40.0000 mg | ORAL_TABLET | Freq: Every day | ORAL | Status: DC

## 2015-02-19 MED ORDER — CIPROFLOXACIN HCL 500 MG PO TABS: 500.0000 mg | ORAL_TABLET | Freq: Two times a day (BID) | ORAL | Status: DC

## 2015-02-19 NOTE — Progress Notes (Signed)
PHARMACIST - PHYSICIAN COMMUNICATION CONCERNING: Antibiotic IV to Oral Route Change Policy  RECOMMENDATION: This patient is receiving ciprofloxacin by the intravenous route.  Based on criteria approved by the Pharmacy and Therapeutics Committee, the antibiotic(s) is/are being converted to the equivalent oral dose form(s).   DESCRIPTION: These criteria include:  Patient being treated for a respiratory tract infection, urinary tract infection, cellulitis or clostridium difficile associated diarrhea if on metronidazole  The patient is not neutropenic and does not exhibit a GI malabsorption state  The patient is eating (either orally or via tube) and/or has been taking other orally administered medications for a least 24 hours  The patient is improving clinically and has a Tmax < 100.5  If you have questions about this conversion, please contact the Pharmacy Department  []   [phone] )  [facility] [x]   [phone] )  [facility] []   [phone] )  [facility] []   [phone] )  [facility] []   [phone] )  [facility]

## 2015-02-19 NOTE — Discharge Summary (Signed)
[facility] at [place] NAME: Karen [name]    MR#:  [id number]  DATE OF BIRTH:  [birth year]  DATE OF ADMISSION:  02/17/2015 ADMITTING PHYSICIAN: Karen Mango, MD  DATE OF DISCHARGE: 02/19/2015   PRIMARY CARE PHYSICIAN: Karen Noble, MD    ADMISSION DIAGNOSIS:  Acute pancreatitis, unspecified pancreatitis type [K85.9] Sepsis, due to unspecified organism [facility]) [A41.9] Acute renal failure, unspecified acute renal failure type ([place]) [N17.9] Diabetic ketoacidosis without coma associated with type 2 diabetes mellitus ([place]) [E13.10]  DISCHARGE DIAGNOSIS:  Active Problems:   DKA (diabetic ketoacidoses) ([place])   Emphysematous cystitis   SECONDARY DIAGNOSIS:   Past Medical History  Diagnosis Date  . Diabetes mellitus without complication ([place])   . Hypercholesteremia     HOSPITAL COURSE:   60 year old female with a history of diabetes, noncompliance of medications and EtOH abuse who presents with abdominal pain and elevated blood sugar.  1. DKA with severe anion gap metabolic acidosis: Patient was placed on DKA protocol. Patient's anion gap has closed and CO2 normalized. Hemoglobin A1c was greater than 11%. She will need close follow-up as an outpatient.  She is noncompliant and I stressed that she should be compliant with her medications as uncontrolled diabetes can lead to multi organ damage. 2. Acute EtOH related pancreatitis: Pancreatitis improved. She is tolerating her diet.    3. Emphysematous cystitis: Appreciate urology consultation. Continue Foley and ciprofloxacin. Patient needs a Foley in for 1 week. She will have follow-up with urology for voiding trial that time.  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  4. EtOH abuse: detoxification was uneventful. .  5. Hyperlipidemia: Start Zocor. LDL is 283. Triglycerides is 139. Cholesterol 360.  6. Hypokalemia: This was repleted.     patient is stable for discharge on diabetic diet  DRUG ALLERGIES:   Allergies  Allergen Reactions  . Penicillins Anaphylaxis    Has patient had a PCN reaction causing immediate rash, facial/tongue/throat swelling, SOB or lightheadedness with hypotension: Yes Has patient had a PCN reaction causing severe rash involving mucus membranes or skin necrosis: No Has patient had a PCN reaction that required hospitalization No Has patient had a PCN reaction occurring within the last 10 years: No If all of the above answers are "NO", then may proceed with Cephalosporin use.  Marland Kitchen Percocet [Oxycodone-Acetaminophen] Rash    DISCHARGE MEDICATIONS:   Current Discharge Medication List    START taking these medications   Details  ciprofloxacin (CIPRO) 500 MG tablet Take 1 tablet (500 mg total) by mouth 2 (two) times daily. Qty: 14 tablet, Refills: 0    GLUCERNA (GLUCERNA) LIQD Take 237 mLs by mouth 3 (three) times daily between meals. Qty: 30 Can, Refills: 0    simvastatin (ZOCOR) 40 MG tablet Take 1 tablet (40 mg total) by mouth daily at 6 PM. Qty: 30 tablet, Refills: 0      CONTINUE these medications which have NOT CHANGED   Details  insulin aspart protamine - aspart (NOVOLOG MIX 70/30 FLEXPEN) (70-30) 100 UNIT/ML FlexPen Inject 15 Units into the skin 2 (two) times daily.              Today   CHIEF COMPLAINT:   doing well. No acute issues. She is denying abdominal pain.  VITAL SIGNS:  Blood pressure 138/76, pulse 72, temperature 98.2 F (36.8 C), temperature source Oral, resp. rate 18, height 5\' 1"  (1.549 m), weight 48.1 kg (  106 lb 0.7 oz), SpO2 98 %.   REVIEW OF SYSTEMS:  Review of Systems  Constitutional: Negative for fever, chills and malaise/fatigue.  HENT: Negative for sore throat.   Eyes: Negative for blurred vision.  Respiratory: Negative for cough, hemoptysis, shortness of breath and wheezing.   Cardiovascular: Negative for chest pain, palpitations and leg swelling.   Gastrointestinal: Negative for nausea, vomiting, abdominal pain, diarrhea and blood in stool.  Genitourinary: Negative for dysuria.  Musculoskeletal: Negative for back pain.  Neurological: Negative for dizziness, tremors and headaches.  Endo/Heme/Allergies: Does not bruise/bleed easily.     PHYSICAL EXAMINATION:  GENERAL:  60 y.o.-year-old patient lying in the bed with no acute distress.  NECK:  Supple, no jugular venous distention. No thyroid enlargement, no tenderness.  LUNGS: Normal breath sounds bilaterally, no wheezing, rales,rhonchi  No use of accessory muscles of respiration.  CARDIOVASCULAR: S1, S2 normal. No murmurs, rubs, or gallops.  ABDOMEN: Soft, non-tender, non-distended. Bowel sounds present. No organomegaly or mass.  EXTREMITIES: No pedal edema, cyanosis, or clubbing.  PSYCHIATRIC: The patient is alert and oriented x 3.  SKIN: No obvious rash, lesion, or ulcer.   DATA REVIEW:   CBC  Recent Labs Lab 02/19/15 0418  WBC 7.5  HGB 10.8*  HCT 31.3*  PLT 130*    Chemistries   Recent Labs Lab 02/18/15 0127  02/19/15 0418  NA 142  < > 140  K 3.0*  < > 3.1*  CL 114*  < > 105  CO2 24  < > 21*  GLUCOSE 156*  < > 192*  BUN 22*  < > 11  CREATININE 0.92  < > 0.75  CALCIUM 7.0*  < > 6.8*  AST 14*  --   --   ALT 12*  --   --   ALKPHOS 56  --   --   BILITOT 0.9  --   --   < > = values in this interval not displayed.  Cardiac Enzymes  Recent Labs Lab 02/17/15 0654  TROPONINI <0.03    Microbiology Results  @[email or web address] @  RADIOLOGY:  No results found.    Management plans discussed with the patient and She is in agreement. Stable for discharge home  Patient should follow up with  PCP and UROLOGY  in 1 week  CODE STATUS:     Code Status Orders        Start     Ordered   02/17/15 1705  Full code   Continuous     02/17/15 1704    Code Status History    Date Active Date Inactive Code Status Order ID Comments User Context   This patient has  a current code status but no historical code status.      TOTAL TIME TAKING CARE OF THIS PATIENT: 35 minutes.    Note: This dictation was prepared with Dragon dictation along with smaller phrase technology. Any transcriptional errors that result from this process are unintentional.  Karen [name] M.D on 02/19/2015 at 10:55 AM  Between 7am to 6pm - Pager - [phone] After 6pm go to [email or web address] - password [email or web address] [facility]  Office  [phone]  CC: Primary care physician; Karen Noble, MD

## 2015-02-19 NOTE — Progress Notes (Signed)
Inpatient Diabetes Program Recommendations  AACE/ADA: New Consensus Statement on Inpatient Glycemic Control (2015)  Target Ranges:  Prepandial:   less than 140 mg/dL      Peak postprandial:   less than 180 mg/dL (1-2 hours)      Critically ill patients:  140 - 180 mg/dL  Results for Karen [name], Karen [name] (MRN [id number]) as of 02/19/2015 07:40  Ref. Range 02/18/2015 07:41 02/18/2015 11:17 02/18/2015 16:50 02/18/2015 22:04 02/19/2015 06:20 02/19/2015 07:32  Glucose-Capillary Latest Ref Range: 65-99 mg/dL 171 (H) 211 (H) 280 (H) 217 (H) 184 (H) 157 (H)  Results for Karen [name], Karen [name] (MRN [id number]) as of 02/19/2015 07:40  Ref. Range 02/18/2015 01:27  Hemoglobin A1C Latest Ref Range: 4.0-6.0 % 11.7 (H)   Review of Glycemic Control  Outpatient Diabetes medications: 70/30 15 units BID Current orders for Inpatient glycemic control: Lantus 5 units BID, Novolog 0-9 untis TID with meals, Novolog 0-5 units HS  Inpatient Diabetes Program Recommendations: Insulin - Basal: Lantus was increased to 5 units BID yesterday and fasting glucose is morning is 157 mg/dl. No further recommendations for basal changes at this time. Insulin - Meal Coverage: Once diet is advanced and patient is tolerating diet and eating at least 50% of meals, please consider ordering Novolog 3 units TID with meals for meal coverage. HgbA1C: A1C 11.7% on 02/18/15 indicating an average glucose of 289 mg/dl over the past 2-3 months.  Thanks, Barnie Alderman, RN, MSN, CDE Diabetes Coordinator Inpatient Diabetes Program [phone] (Team Pager from [place] to [place]) [phone] ([place] office) [phone] [facility] office) [phone] [facility] office)

## 2015-02-19 NOTE — Evaluation (Signed)
Physical Therapy Evaluation Patient Details Name: Karen [name] MRN: [id number] DOB: 1955-09-18 Today's Date: 02/19/2015   History of Present Illness  Pt admitted for DKA and acute pancreatitis. Pt with history of DM, hyperlipidemia, and alcohol abuse. Pt complains of N&V x 2 days.   Clinical Impression  Pt is a pleasant 60 year old female who was admitted for DKA and acute pancreatitis. Pt performs bed mobility, transfers, and ambulation with independently without AD. Pt is at baseline level. Pt does not require any further PT needs at this time. Pt will be dc in house and does not require follow up. RN aware. Will dc current orders.      Follow Up Recommendations No PT follow up    Equipment Recommendations  None recommended by PT    Recommendations for Other Services       Precautions / Restrictions Precautions Precautions: Fall Restrictions Weight Bearing Restrictions: No      Mobility  Bed Mobility Overal bed mobility: Independent             General bed mobility comments: safe technique performed  Transfers Overall transfer level: Independent Equipment used: None             General transfer comment: transfers performed indpendently. Safe technique performed  Ambulation/Gait Ambulation/Gait assistance: Independent Ambulation Distance (Feet): 120 Feet Assistive device: None Gait Pattern/deviations: Step-through pattern     General Gait Details: ambulation performed safely without LOB noted. Safe technique performed without AD. Pt able to carry foley during ambulation.  Stairs            Wheelchair Mobility    Modified Rankin (Stroke Patients Only)       Balance Overall balance assessment: Independent                                           Pertinent Vitals/Pain Pain Assessment: No/denies pain    Home Living Family/patient expects to be discharged to:: Private residence Living Arrangements: Spouse/significant  other Available Help at Discharge: Family Type of Home: House Home Access: Level entry     Home Layout: One [place]: Karen [name] - single point      Prior Function Level of Independence: Independent               Hand Dominance        Extremity/Trunk Assessment   Upper Extremity Assessment: Overall WFL for tasks assessed           Lower Extremity Assessment: Overall WFL for tasks assessed         Communication   Communication: No difficulties  Cognition Arousal/Alertness: Awake/alert Behavior During Therapy: WFL for tasks assessed/performed Overall Cognitive Status: Within Functional Limits for tasks assessed                      General Comments      Exercises        Assessment/Plan    PT Assessment Patent does not need any further PT services  PT Diagnosis     PT Problem List    PT Treatment Interventions     PT Goals (Current goals can be found in the Care Plan section) Acute Rehab PT Goals Patient Stated Goal: to go home PT Goal Formulation: All assessment and education complete, DC therapy Time For Goal Achievement: 02/19/15 Potential to Achieve Goals: Good  Frequency     Barriers to discharge        Co-evaluation               End of Session Equipment Utilized During Treatment: Gait belt Activity Tolerance: Patient tolerated treatment well Patient left: in bed Nurse Communication: Mobility status         Time: [id number] PT Time Calculation (min) (ACUTE ONLY): 11 min   Charges:   PT Evaluation $PT Eval Low Complexity: 1 Procedure     PT G Codes:        Karen [name] 13-Mar-2015, 2:36 PM  Karen [name], PT, DPT [phone]

## 2015-02-19 NOTE — Telephone Encounter (Signed)
Done   Thanks,  Sharyn Lull

## 2015-02-20 ENCOUNTER — Encounter: Payer: Self-pay | Admitting: *Deleted

## 2015-02-20 LAB — URINE CULTURE: SPECIAL REQUESTS: NORMAL

## 2015-02-27 ENCOUNTER — Ambulatory Visit (INDEPENDENT_AMBULATORY_CARE_PROVIDER_SITE_OTHER): Payer: Medicaid Other | Admitting: Obstetrics and Gynecology

## 2015-02-27 ENCOUNTER — Encounter: Payer: Self-pay | Admitting: Obstetrics and Gynecology

## 2015-02-27 VITALS — BP 136/80 | HR 81 | Resp 16 | Ht 61.0 in | Wt 105.6 lb

## 2015-02-27 DIAGNOSIS — R339 Retention of urine, unspecified: Secondary | ICD-10-CM | POA: Diagnosis not present

## 2015-02-27 LAB — BLOOD GAS, VENOUS
Acid-base deficit: 18.3 mmol/L — ABNORMAL HIGH (ref 0.0–2.0)
Acid-base deficit: 9.5 mmol/L — ABNORMAL HIGH (ref 0.0–2.0)
Bicarbonate: 9.2 mEq/L — ABNORMAL LOW (ref 21.0–28.0)
Bicarbonate: 14.4 mEq/L — ABNORMAL LOW (ref 21.0–28.0)
PATIENT TEMPERATURE: 37
PCO2 VEN: 26 mmHg — AB (ref 44.0–60.0)
PH VEN: 7.14 — AB (ref 7.320–7.430)
Patient temperature: 37
pCO2, Ven: 27 mmHg — ABNORMAL LOW (ref 44.0–60.0)
pH, Ven: 7.35 (ref 7.320–7.430)

## 2015-02-27 NOTE — Progress Notes (Signed)
02/27/2015 1:47 PM   Remigio Eisenmenger Patsy [place] November 22, 1955 [id number]  Referring provider: Marden Noble, MD [address] [place], [place] [address]  Chief Complaint  Patient presents with  . Urinary Retention    Voiding trial  . Establish Care    HPI: 61 y.o. female with a known history of diabetes mellitus and hyperlipidemia who presented to the ER due to nausea, vomiting, and abdominal pain. No alleviating factors. This has been ongoing for approximately 4-5 days. She also endorses non-compliance with her insulin dosing. In th ER, she was noted to be in DKA and was admitted to the ICU for close monitoring. In the ER, she did undergo a CT A/P which demonstrated a distended bladder with emphysematous changes consistent with emphysematous cystitis. The patient also was diagnosed with acute pancreatitis.   She is not a great historian however does report difficulty with voiding on occasion, she describes some dysuria but this is not clear from her history. Denies hematuria. She denies fevers at home.   On call Urologist was consulted for difficulty Foley placement.  Catheter wa replaced without difficulty with an initial output of 600cc.   Patient presents today for a voiding trial.   PMH: Past Medical History  Diagnosis Date  . Diabetes mellitus without complication ([place])   . Hypercholesteremia   . DKA (diabetic ketoacidoses) ([place]) 02/17/2015  . Emphysematous cystitis   . Alcoholic pancreatitis   . Hypokalemia   . Depression     Surgical History: Past Surgical History  Procedure Laterality Date  . Abdominal hysterectomy  1996  . Appendectomy  1997  . Thyroid surgery  2013  . Hand surgery  1988    Home Medications:    Medication List       This list is accurate as of: 02/27/15  1:47 PM.  Always use your most recent med list.               ciprofloxacin 500 MG tablet  Commonly known as:  CIPRO  Take 1 tablet (500 mg total) by mouth 2 (two) times daily.     NOVOLOG MIX  70/30 FLEXPEN (70-30) 100 UNIT/ML FlexPen  Generic drug:  insulin aspart protamine - aspart  Inject 15 Units into the skin 2 (two) times daily.     simvastatin 40 MG tablet  Commonly known as:  ZOCOR  Take 1 tablet (40 mg total) by mouth daily at 6 PM.        Allergies:  Allergies  Allergen Reactions  . Penicillins Anaphylaxis    Has patient had a PCN reaction causing immediate rash, facial/tongue/throat swelling, SOB or lightheadedness with hypotension: Yes Has patient had a PCN reaction causing severe rash involving mucus membranes or skin necrosis: No Has patient had a PCN reaction that required hospitalization No Has patient had a PCN reaction occurring within the last 10 years: No If all of the above answers are "NO", then may proceed with Cephalosporin use.  Marland Kitchen Percocet [Oxycodone-Acetaminophen] Rash    Family History: History reviewed. No pertinent family history.  Social History:  reports that she has quit smoking. She does not have any smokeless tobacco history on file. She reports that she drinks alcohol. Her drug history is not on file.  ROS: UROLOGY Frequent Urination?: Yes Hard to postpone urination?: No Burning/pain with urination?: No Get up at night to urinate?: Yes Leakage of urine?: No Urine stream starts and stops?: No Trouble starting stream?: No Do you have to strain to urinate?: No  Blood in urine?: No Urinary tract infection?: No Sexually transmitted disease?: No Injury to kidneys or bladder?: No Painful intercourse?: No Weak stream?: No Currently pregnant?: No Vaginal bleeding?: No Last menstrual period?: n/a  Gastrointestinal Nausea?: Yes Vomiting?: Yes Indigestion/heartburn?: Yes Diarrhea?: No Constipation?: No  Constitutional Fever: Yes Night sweats?: Yes Weight loss?: Yes Fatigue?: No  Skin Skin rash/lesions?: Yes Itching?: Yes  Eyes Blurred vision?: No Double vision?: No  Ears/Nose/Throat Sore throat?: Yes Sinus  problems?: No  Hematologic/Lymphatic Swollen glands?: No Easy bruising?: No  Cardiovascular Leg swelling?: Yes Chest pain?: No  Respiratory Cough?: Yes Shortness of breath?: No  Endocrine Excessive thirst?: No  Musculoskeletal Back pain?: Yes Joint pain?: No  Neurological Headaches?: Yes Dizziness?: No  Psychologic Depression?: Yes Anxiety?: No  Physical Exam: BP 136/80 mmHg  Pulse 81  Resp 16  Ht 5\' 1"  (1.549 m)  Wt 105 lb 9.6 oz (47.9 kg)  BMI 19.96 kg/m2  Constitutional:  Alert and oriented, No acute distress. HEENT: [place] AT, moist mucus membranes.  Trachea midline, no masses. Cardiovascular: No clubbing, cyanosis, or edema. Respiratory: Normal respiratory effort, no increased work of breathing. GI: Abdomen is soft, nontender, nondistended, no abdominal masses GU: No CVA tenderness.  Skin: No rashes, bruises or suspicious lesions. Lymph: No cervical or inguinal adenopathy. Neurologic: Grossly intact, no focal deficits, moving all 4 extremities. Psychiatric: Normal mood and affect.  Laboratory Data:  Lab Results  Component Value Date   WBC 7.5 02/19/2015   HGB 10.8* 02/19/2015   HCT 31.3* 02/19/2015   MCV 90.9 02/19/2015   PLT 130* 02/19/2015    Lab Results  Component Value Date   CREATININE 0.75 02/19/2015    No results found for: PSA  No results found for: TESTOSTERONE  Lab Results  Component Value Date   HGBA1C 11.7* 02/18/2015    Urinalysis    Component Value Date/Time   COLORURINE YELLOW* 02/17/2015 0641   COLORURINE Yellow 08/11/2013 0729   APPEARANCEUR HAZY* 02/17/2015 0641   APPEARANCEUR Hazy 08/11/2013 0729   LABSPEC 1.016 02/17/2015 0641   LABSPEC 1.030 08/11/2013 0729   PHURINE 6.0 02/17/2015 0641   PHURINE 5.0 08/11/2013 0729   GLUCOSEU >500* 02/17/2015 0641   GLUCOSEU >=500 08/11/2013 0729   HGBUR 2+* 02/17/2015 0641   HGBUR 1+ 08/11/2013 0729   BILIRUBINUR NEGATIVE 02/17/2015 0641   BILIRUBINUR Negative 08/11/2013  0729   KETONESUR 2+* 02/17/2015 0641   KETONESUR 2+ 08/11/2013 0729   PROTEINUR >500* 02/17/2015 0641   PROTEINUR 100 mg/dL 08/11/2013 0729   NITRITE NEGATIVE 02/17/2015 0641   NITRITE Negative 08/11/2013 0729   LEUKOCYTESUR 3+* 02/17/2015 0641   LEUKOCYTESUR Negative 08/11/2013 0729    Pertinent Imaging: CLINICAL DATA: 60 year old female with acute abdominal pain and vomiting for 1 day.  EXAM: CT ABDOMEN AND PELVIS WITH CONTRAST  TECHNIQUE: Multidetector CT imaging of the abdomen and pelvis was performed using the standard protocol following bolus administration of intravenous contrast.  CONTRAST: 51mL OMNIPAQUE IOHEXOL 300 MG/ML SOLN  COMPARISON: None.  FINDINGS: Lower chest: Unremarkable  Hepatobiliary: Hepatic steatosis identified without focal hepatic abnormality. The gallbladder is unremarkable. There is no evidence of biliary dilatation.  Pancreas: There is equivocal stranding/ inflammation along the pancreatic tail. The pancreas is otherwise unremarkable.  Spleen: Unremarkable  Adrenals/Urinary Tract: The bladder is distended containing gas. Gas along the walls of the bladder also identified-compatible with emphysematous cystitis. There is mild enhancement of the ureteral walls bilaterally likely representing infection. There is no evidence of renal mass,  abscess, hydronephrosis or gas within the kidneys. The adrenal glands are unremarkable.  Stomach/Bowel: Mild circumferential wall thickening of the distal esophagus likely represents esophagitis. There is no evidence of bowel obstruction or other definite areas of bowel wall thickening. Colonic diverticulosis noted.  Vascular/Lymphatic: Aortic atherosclerotic calcifications noted without aneurysm. No enlarged lymph nodes identified.  Reproductive: The patient is status post hysterectomy. There is no evidence of adnexal mass.  Other: No free fluid, abscess or  pneumoperitoneum  Musculoskeletal: No acute or suspicious abnormalities.  IMPRESSION: Emphysematous cystitis.  Equivocal stranding/inflammation along the pancreatic tail which could represent mild pancreatitis.  Mild distal esophagitis.  Hepatic steatosis.  Aortic atherosclerosis.  Electronically Signed  By: Margarette Canada M.D.  On: 02/17/2015 11:03  Assessment & Plan:    1. Urinary retention-  Most likely poorly contractile bladder secondary to her diabetes.  Successful voiding trial today.  Should symptoms recur patient may need urodynamics in the future.  There are no diagnoses linked to this encounter.  Return in about 1 month (around 03/27/2015) for recheck PVR with Shannon/ possible schedule UDS.  These notes generated with voice recognition software. I apologize for typographical errors.  Herbert Moors, [facility] [address] [place], [place] [address] [phone]

## 2015-02-27 NOTE — Progress Notes (Signed)
Fill and Pull Catheter Removal  Patient is present today for a catheter removal.  Patient was cleaned and prepped in a sterile fashion 220 ml of sterile water/ saline was instilled into the bladder when the patient felt the urge to urinate. 10 ml of water was then drained from the balloon.  A 16 FR foley cath was removed from the bladder no complications were noted .  Patient as then given some time to void on their own.  Patient can void  200 ml on their own after some time.  Patient tolerated well.  Preformed by: Golden Hurter, CMA  Follow up/ Additional notes: 1 month for PVR.

## 2015-03-27 ENCOUNTER — Ambulatory Visit: Payer: Medicaid Other | Admitting: Urology

## 2015-04-01 ENCOUNTER — Emergency Department
Admission: EM | Admit: 2015-04-01 | Discharge: 2015-04-01 | Disposition: A | Discharge status: Home / Self Care | Payer: Medicaid Other | Attending: Emergency Medicine | Admitting: Emergency Medicine

## 2015-04-01 ENCOUNTER — Encounter: Payer: Self-pay | Admitting: Emergency Medicine

## 2015-04-01 DIAGNOSIS — E1140 Type 2 diabetes mellitus with diabetic neuropathy, unspecified: Secondary | ICD-10-CM | POA: Diagnosis not present

## 2015-04-01 DIAGNOSIS — R079 Chest pain, unspecified: Secondary | ICD-10-CM | POA: Diagnosis present

## 2015-04-01 LAB — CBC
HCT: 33.9 % — ABNORMAL LOW (ref 35.0–47.0)
HEMOGLOBIN: 11.6 g/dL — AB (ref 12.0–16.0)
MCH: 30 pg (ref 26.0–34.0)
MCHC: 34.1 g/dL (ref 32.0–36.0)
MCV: 88.1 fL (ref 80.0–100.0)
PLATELETS: 87 10*3/uL — AB (ref 150–440)
RBC: 3.85 MIL/uL (ref 3.80–5.20)
RDW: 12.7 % (ref 11.5–14.5)
WBC: 8.5 10*3/uL (ref 3.6–11.0)

## 2015-04-01 LAB — BASIC METABOLIC PANEL
Anion gap: 14 (ref 5–15)
BUN: 21 mg/dL — AB (ref 6–20)
CALCIUM: 9.2 mg/dL (ref 8.9–10.3)
CO2: 18 mmol/L — AB (ref 22–32)
CREATININE: 0.92 mg/dL (ref 0.44–1.00)
Chloride: 99 mmol/L — ABNORMAL LOW (ref 101–111)
GFR calc Af Amer: >60 mL/min (ref >60)
GFR calc non Af Amer: >60 mL/min (ref >60)
GLUCOSE: 322 mg/dL — AB (ref 65–99)
Potassium: 4.1 mmol/L (ref 3.5–5.1)
Sodium: 131 mmol/L — ABNORMAL LOW (ref 135–145)

## 2015-04-01 LAB — TROPONIN I

## 2015-04-01 NOTE — ED Notes (Signed)
Pt presents to ED with complaints of leg pain from her toes to her hips. Pt states her left arm feels numb and tingly. Pt states she has diabetic neuropathy. Pt states the pain has increased since yesterday. Pt reports new onset chest pain today.

## 2015-04-08 ENCOUNTER — Encounter: Payer: Self-pay | Admitting: Urology

## 2015-04-08 ENCOUNTER — Ambulatory Visit: Payer: Medicaid Other | Admitting: Urology

## 2015-04-23 ENCOUNTER — Encounter: Payer: Self-pay | Admitting: Urology

## 2015-04-23 ENCOUNTER — Ambulatory Visit: Payer: Medicaid Other | Admitting: Urology

## 2015-05-14 ENCOUNTER — Emergency Department
Admission: EM | Admit: 2015-05-14 | Discharge: 2015-05-14 | Disposition: A | Discharge status: Home / Self Care | Payer: Medicaid Other | Attending: Emergency Medicine | Admitting: Emergency Medicine

## 2015-05-14 ENCOUNTER — Emergency Department: Payer: Medicaid Other

## 2015-05-14 DIAGNOSIS — Z792 Long term (current) use of antibiotics: Secondary | ICD-10-CM | POA: Insufficient documentation

## 2015-05-14 DIAGNOSIS — K852 Alcohol induced acute pancreatitis without necrosis or infection: Secondary | ICD-10-CM | POA: Diagnosis not present

## 2015-05-14 DIAGNOSIS — K860 Alcohol-induced chronic pancreatitis: Secondary | ICD-10-CM

## 2015-05-14 DIAGNOSIS — R109 Unspecified abdominal pain: Secondary | ICD-10-CM | POA: Diagnosis present

## 2015-05-14 DIAGNOSIS — Z794 Long term (current) use of insulin: Secondary | ICD-10-CM | POA: Diagnosis not present

## 2015-05-14 DIAGNOSIS — F10920 Alcohol use, unspecified with intoxication, uncomplicated: Secondary | ICD-10-CM

## 2015-05-14 DIAGNOSIS — Z87891 Personal history of nicotine dependence: Secondary | ICD-10-CM | POA: Insufficient documentation

## 2015-05-14 DIAGNOSIS — F329 Major depressive disorder, single episode, unspecified: Secondary | ICD-10-CM | POA: Diagnosis not present

## 2015-05-14 DIAGNOSIS — F10129 Alcohol abuse with intoxication, unspecified: Secondary | ICD-10-CM | POA: Diagnosis not present

## 2015-05-14 DIAGNOSIS — E119 Type 2 diabetes mellitus without complications: Secondary | ICD-10-CM | POA: Insufficient documentation

## 2015-05-14 LAB — URINALYSIS COMPLETE WITH MICROSCOPIC (ARMC ONLY)
BILIRUBIN URINE: NEGATIVE
KETONES UR: NEGATIVE mg/dL
NITRITE: NEGATIVE
Protein, ur: 30 mg/dL — AB
SPECIFIC GRAVITY, URINE: 1.001 — AB (ref 1.005–1.030)
pH: 6 (ref 5.0–8.0)

## 2015-05-14 LAB — URINE DRUG SCREEN, QUALITATIVE (ARMC ONLY)
Amphetamines, Ur Screen: NOT DETECTED
BARBITURATES, UR SCREEN: NOT DETECTED
Benzodiazepine, Ur Scrn: NOT DETECTED
CANNABINOID 50 NG, UR ~~LOC~~: NOT DETECTED
COCAINE METABOLITE, UR ~~LOC~~: NOT DETECTED
MDMA (Ecstasy)Ur Screen: NOT DETECTED
Methadone Scn, Ur: NOT DETECTED
OPIATE, UR SCREEN: NOT DETECTED
PHENCYCLIDINE (PCP) UR S: NOT DETECTED
TRICYCLIC, UR SCREEN: NOT DETECTED

## 2015-05-14 LAB — COMPREHENSIVE METABOLIC PANEL
ALBUMIN: 3.9 g/dL (ref 3.5–5.0)
ALK PHOS: 126 U/L (ref 38–126)
ALT: 29 U/L (ref 14–54)
AST: 31 U/L (ref 15–41)
Anion gap: 15 (ref 5–15)
BILIRUBIN TOTAL: 0.6 mg/dL (ref 0.3–1.2)
BUN: 22 mg/dL — AB (ref 6–20)
CALCIUM: 9.3 mg/dL (ref 8.9–10.3)
CO2: 18 mmol/L — ABNORMAL LOW (ref 22–32)
CREATININE: 0.83 mg/dL (ref 0.44–1.00)
Chloride: 98 mmol/L — ABNORMAL LOW (ref 101–111)
Glucose, Bld: 339 mg/dL — ABNORMAL HIGH (ref 65–99)
Potassium: 4 mmol/L (ref 3.5–5.1)
SODIUM: 131 mmol/L — AB (ref 135–145)
TOTAL PROTEIN: 7.9 g/dL (ref 6.5–8.1)

## 2015-05-14 LAB — LIPASE, BLOOD: LIPASE: 80 U/L — AB (ref 11–51)

## 2015-05-14 LAB — CBC
HCT: 37.7 % (ref 35.0–47.0)
Hemoglobin: 12.8 g/dL (ref 12.0–16.0)
MCH: 30.3 pg (ref 26.0–34.0)
MCHC: 34 g/dL (ref 32.0–36.0)
MCV: 89.1 fL (ref 80.0–100.0)
PLATELETS: 79 10*3/uL — AB (ref 150–440)
RBC: 4.23 MIL/uL (ref 3.80–5.20)
RDW: 13.1 % (ref 11.5–14.5)
WBC: 6.6 10*3/uL (ref 3.6–11.0)

## 2015-05-14 LAB — ETHANOL: Alcohol, Ethyl (B): 229 mg/dL — ABNORMAL HIGH (ref <5)

## 2015-05-14 MED ORDER — MORPHINE SULFATE (PF) 4 MG/ML IV SOLN
4.0000 mg | Freq: Once | INTRAVENOUS | Status: AC
  Administered 2015-05-14: 4 mg via INTRAVENOUS
  Filled 2015-05-14: qty 1

## 2015-05-14 MED ORDER — SODIUM CHLORIDE 0.9 % IV SOLN
Freq: Once | INTRAVENOUS | Status: AC
  Administered 2015-05-14: 11:00:00 via INTRAVENOUS

## 2015-05-14 MED ORDER — OXYCODONE-ACETAMINOPHEN 5-325 MG PO TABS: 2.0000 | ORAL_TABLET | Freq: Four times a day (QID) | ORAL | Status: DC | PRN

## 2015-05-14 MED ORDER — IOPAMIDOL (ISOVUE-300) INJECTION 61%
80.0000 mL | Freq: Once | INTRAVENOUS | Status: AC | PRN
  Administered 2015-05-14: 80 mL via INTRAVENOUS

## 2015-05-14 MED ORDER — ONDANSETRON HCL 4 MG PO TABS: 4.0000 mg | ORAL_TABLET | Freq: Every day | ORAL | Status: DC | PRN

## 2015-05-14 MED ORDER — DIATRIZOATE MEGLUMINE & SODIUM 66-10 % PO SOLN
15.0000 mL | ORAL | Status: AC
  Administered 2015-05-14: 15 mL via ORAL

## 2015-05-14 MED ORDER — IOPAMIDOL (ISOVUE-370) INJECTION 76%: 80.0000 mL | Freq: Once | INTRAVENOUS | Status: DC | PRN

## 2015-05-14 MED ORDER — ONDANSETRON HCL 4 MG/2ML IJ SOLN
4.0000 mg | Freq: Once | INTRAMUSCULAR | Status: AC
  Administered 2015-05-14: 4 mg via INTRAVENOUS
  Filled 2015-05-14: qty 2

## 2015-05-14 NOTE — ED Notes (Signed)
Pt transported to CT scan.

## 2015-05-14 NOTE — ED Notes (Signed)
Pt came to ED c/o right abdominal pain. Pt abdomen is tender. Pt history of diabetes. Pt drinks regularly, had 2 beers this morning.

## 2015-05-14 NOTE — ED Provider Notes (Signed)
[facility] Emergency Department Provider Note        Time seen: ----------------------------------------- 10:42 AM on 05/14/2015 -----------------------------------------    I have reviewed the triage vital signs and the nursing notes.   HISTORY  Chief Complaint Abdominal Pain    HPI Karen [name] is a 60 y.o. female who presents ER for right-sided abdominal pain.Patient drinks regularly and reportedly had 2 beers this morning. She does also have a history of diabetes, unclear what her blood sugar has been. She denies fevers or chills, states she has persistent abdominal pain is worse on the right side. Nothing makes it better. Pain onset was this morning.   Past Medical History  Diagnosis Date  . Diabetes mellitus without complication ([place])   . Hypercholesteremia   . DKA (diabetic ketoacidoses) ([place]) 02/17/2015  . Emphysematous cystitis   . Alcoholic pancreatitis   . Hypokalemia   . Depression     Patient Active Problem List   Diagnosis Date Noted  . DKA (diabetic ketoacidoses) ([place]) 02/17/2015  . Emphysematous cystitis     Past Surgical History  Procedure Laterality Date  . Abdominal hysterectomy  1996  . Appendectomy  1997  . Thyroid surgery  2013  . Hand surgery  1988    Allergies Penicillins and Percocet  Social History Social History  Substance Use Topics  . Smoking status: Former Research scientist (life sciences)  . Smokeless tobacco: None  . Alcohol Use: Yes     Comment: daily    Review of Systems Constitutional: Negative for fever. Eyes: Negative for visual changes. ENT: Negative for sore throat. Cardiovascular: Negative for chest pain. Respiratory: Negative for shortness of breath. Gastrointestinal: Positive for abdominal pain and vomiting Genitourinary: Negative for dysuria. Musculoskeletal: Negative for back pain. Skin: Negative for rash. Neurological: Negative for headaches, focal weakness or numbness.  10-point ROS otherwise  negative.  ____________________________________________   PHYSICAL EXAM:  VITAL SIGNS: ED Triage Vitals  Enc Vitals Group     BP 05/14/15 0936 112/90 mmHg     Pulse Rate 05/14/15 1000 97     Resp 05/14/15 0936 16     Temp 05/14/15 0936 98.3 F (36.8 C)     Temp Source 05/14/15 0936 Oral     SpO2 05/14/15 0936 98 %     Weight 05/14/15 0936 105 lb (47.628 kg)     Height 05/14/15 0936 5\' 1"  (1.549 m)     Head Cir --      Peak Flow --      Pain Score --      Pain Loc --      Pain Edu? --      Excl. in [place]? --     Constitutional: Alert and oriented. Mild distress Eyes: Conjunctivae are normal. PERRL. Normal extraocular movements. ENT   Head: Normocephalic and atraumatic.   Nose: No congestion/rhinnorhea.   Mouth/Throat: Mucous membranes are moist.   Neck: No stridor. Cardiovascular: Normal rate, regular rhythm. No murmurs, rubs, or gallops. Respiratory: Normal respiratory effort without tachypnea nor retractions. Breath sounds are clear and equal bilaterally. No wheezes/rales/rhonchi. Gastrointestinal: Distended, normal bowel sounds, nonfocal tenderness Musculoskeletal: Nontender with normal range of motion in all extremities. No lower extremity tenderness nor edema. Neurologic:  Normal speech and language. No gross focal neurologic deficits are appreciated.  Skin:  Skin is warm, dry and intact. No rash noted. Psychiatric: Mood and affect are normal. Speech and behavior are normal.   ____________________________________________  ED COURSE:  Pertinent labs & imaging results  that were available during my care of the patient were reviewed by me and considered in my medical decision making (see chart for details). Patient presents in no acute distress, likely pancreatitis. I will check basic labs and reevaluate. ____________________________________________    LABS (pertinent positives/negatives)  Labs Reviewed  LIPASE, BLOOD - Abnormal; Notable for the  following:    Lipase 80 (*)    All other components within normal limits  COMPREHENSIVE METABOLIC PANEL - Abnormal; Notable for the following:    Sodium 131 (*)    Chloride 98 (*)    CO2 18 (*)    Glucose, Bld 339 (*)    BUN 22 (*)    All other components within normal limits  CBC - Abnormal; Notable for the following:    Platelets 79 (*)    All other components within normal limits  URINALYSIS COMPLETEWITH MICROSCOPIC ([facility] ONLY) - Abnormal; Notable for the following:    Color, Urine YELLOW (*)    APPearance CLEAR (*)    Glucose, UA >500 (*)    Specific Gravity, Urine 1.001 (*)    Hgb urine dipstick 1+ (*)    Protein, ur 30 (*)    Leukocytes, UA 1+ (*)    Bacteria, UA RARE (*)    Squamous Epithelial / LPF 0-5 (*)    All other components within normal limits  ETHANOL - Abnormal; Notable for the following:    Alcohol, Ethyl (B) 229 (*)    All other components within normal limits  URINE DRUG SCREEN, QUALITATIVE ([facility] ONLY)  CBC WITH DIFFERENTIAL/PLATELET  COMPREHENSIVE METABOLIC PANEL  LIPASE, BLOOD  TROPONIN I    RADIOLOGY Images were viewed by me  CT the abdomen and pelvis with contrast IMPRESSION: 1. Tiny, nonobstructing upper pole right renal calculus. 2. Distended urinary bladder containing persistent air in the bladder. The air could be due to recent catheterization. If the patient has not been recently catheterized, persistent air in the bladder could be due to a nonvisualized vaginal vesical fistula or enteric vesicle fistula. 3. Right colonic diverticulosis. 4. Mild diffuse hepatic steatosis. 5. 9 mm posterior right lobe liver mass. This has nonspecific CT features. Recommend consideration of a limited abdomen ultrasound for further characterization. 6. Small hiatal hernia with significantly improved inflammatory edema. ____________________________________________  FINAL ASSESSMENT AND PLAN  Alcoholic pancreatitis, Alcohol intoxication  Plan:  Patient with labs and imaging as dictated above. Patient was CT findings as dictated above. She is stable for outpatient follow-up with her doctor. I have discussed at length with the family that she needs to quit drinking.   Earleen Newport, MD   Note: This dictation was prepared with Dragon dictation. Any transcriptional errors that result from this process are unintentional   Earleen Newport, MD 05/14/15 [phone]

## 2015-05-14 NOTE — Discharge Instructions (Signed)
Alcohol Intoxication Alcohol intoxication occurs when you drink enough alcohol that it affects your ability to function. It can be mild or very severe. Drinking a lot of alcohol in a short time is called binge drinking. This can be very harmful. Drinking alcohol can also be more dangerous if you are taking medicines or other drugs. Some of the effects caused by alcohol may include:  Loss of coordination.  Changes in mood and behavior.  Unclear thinking.  Trouble talking (slurred speech).  Throwing up (vomiting).  Confusion.  Slowed breathing.  Twitching and shaking (seizures).  Loss of consciousness. HOME CARE  Do not drive after drinking alcohol.  Drink enough water and fluids to keep your pee (urine) clear or pale yellow. Avoid caffeine.  Only take medicine as told by your doctor. GET HELP IF:  You throw up (vomit) many times.  You do not feel better after a few days.  You frequently have alcohol intoxication. Your doctor can help decide if you should see a substance use treatment counselor. GET HELP RIGHT AWAY IF:  You become shaky when you stop drinking.  You have twitching and shaking.  You throw up blood. It may look bright red or like coffee grounds.  You notice blood in your poop (bowel movements).  You become lightheaded or pass out (faint). MAKE SURE YOU:   Understand these instructions.  Will watch your condition.  Will get help right away if you are not doing well or get worse.   This information is not intended to replace advice given to you by your health care provider. Make sure you discuss any questions you have with your health care provider.   Document Released: 06/10/2007 Document Revised: 08/24/2012 Document Reviewed: 05/27/2012 Elsevier Interactive Patient Education 2016 [employer] Acute Pancreatitis Acute pancreatitis is a disease in which the pancreas becomes suddenly inflamed. The pancreas is a large gland located behind your  stomach. The pancreas produces enzymes that help digest food. The pancreas also releases the hormones glucagon and insulin that help regulate blood sugar. Damage to the pancreas occurs when the digestive enzymes from the pancreas are activated and begin attacking the pancreas before being released into the intestine. Most acute attacks last a couple of days and can cause serious complications. Some people become dehydrated and develop low blood pressure. In severe cases, bleeding into the pancreas can lead to shock and can be life-threatening. The lungs, heart, and kidneys may fail. CAUSES  Pancreatitis can happen to anyone. In some cases, the cause is unknown. Most cases are caused by:  Alcohol abuse.  Gallstones. Other less common causes are:  Certain medicines.  Exposure to certain chemicals.  Infection.  Damage caused by an accident (trauma).  Abdominal surgery. SYMPTOMS   Pain in the upper abdomen that may radiate to the back.  Tenderness and swelling of the abdomen.  Nausea and vomiting. DIAGNOSIS  Your caregiver will perform a physical exam. Blood and stool tests may be done to confirm the diagnosis. Imaging tests may also be done, such as X-rays, CT scans, or an ultrasound of the abdomen. TREATMENT  Treatment usually requires a stay in the hospital. Treatment may include:  Pain medicine.  Fluid replacement through an intravenous line (IV).  Placing a tube in the stomach to remove stomach contents and control vomiting.  Not eating for 3 or 4 days. This gives your pancreas a rest, because enzymes are not being produced that can cause further damage.  Antibiotic medicines if your condition  is caused by an infection.  Surgery of the pancreas or gallbladder. HOME CARE INSTRUCTIONS   Follow the diet advised by your caregiver. This may involve avoiding alcohol and decreasing the amount of fat in your diet.  Eat smaller, more frequent meals. This reduces the amount of  digestive juices the pancreas produces.  Drink enough fluids to keep your urine clear or pale yellow.  Only take over-the-counter or prescription medicines as directed by your caregiver.  Avoid drinking alcohol if it caused your condition.  Do not smoke.  Get plenty of rest.  Check your blood sugar at home as directed by your caregiver.  Keep all follow-up appointments as directed by your caregiver. SEEK MEDICAL CARE IF:   You do not recover as quickly as expected.  You develop new or worsening symptoms.  You have persistent pain, weakness, or nausea.  You recover and then have another episode of pain. SEEK IMMEDIATE MEDICAL CARE IF:   You are unable to eat or keep fluids down.  Your pain becomes severe.  You have a fever or persistent symptoms for more than 2 to 3 days.  You have a fever and your symptoms suddenly get worse.  Your skin or the white part of your eyes turn yellow (jaundice).  You develop vomiting.  You feel dizzy, or you faint.  Your blood sugar is high (over 300 mg/dL). MAKE SURE YOU:   Understand these instructions.  Will watch your condition.  Will get help right away if you are not doing well or get worse.   This information is not intended to replace advice given to you by your health care provider. Make sure you discuss any questions you have with your health care provider.   Document Released: 12/22/2004 Document Revised: 06/23/2011 Document Reviewed: 04/02/2011 Elsevier Interactive Patient Education [employer].

## 2015-08-09 ENCOUNTER — Encounter: Payer: Self-pay | Admitting: Emergency Medicine

## 2015-08-09 ENCOUNTER — Emergency Department: Payer: Medicaid Other

## 2015-08-09 ENCOUNTER — Emergency Department
Admission: EM | Admit: 2015-08-09 | Discharge: 2015-08-09 | Disposition: A | Discharge status: Home / Self Care | Payer: Medicaid Other | Attending: Emergency Medicine | Admitting: Emergency Medicine

## 2015-08-09 DIAGNOSIS — K824 Cholesterolosis of gallbladder: Secondary | ICD-10-CM | POA: Diagnosis not present

## 2015-08-09 DIAGNOSIS — N390 Urinary tract infection, site not specified: Secondary | ICD-10-CM | POA: Insufficient documentation

## 2015-08-09 DIAGNOSIS — Z794 Long term (current) use of insulin: Secondary | ICD-10-CM | POA: Insufficient documentation

## 2015-08-09 DIAGNOSIS — Z87891 Personal history of nicotine dependence: Secondary | ICD-10-CM | POA: Diagnosis not present

## 2015-08-09 DIAGNOSIS — E119 Type 2 diabetes mellitus without complications: Secondary | ICD-10-CM | POA: Diagnosis not present

## 2015-08-09 DIAGNOSIS — R112 Nausea with vomiting, unspecified: Secondary | ICD-10-CM | POA: Diagnosis present

## 2015-08-09 DIAGNOSIS — D1809 Hemangioma of other sites: Secondary | ICD-10-CM | POA: Insufficient documentation

## 2015-08-09 DIAGNOSIS — D1803 Hemangioma of intra-abdominal structures: Secondary | ICD-10-CM

## 2015-08-09 LAB — CBC WITH DIFFERENTIAL/PLATELET
Basophils Absolute: 0 10*3/uL (ref 0–0.1)
Basophils Relative: 0 %
Eosinophils Absolute: 0 10*3/uL (ref 0–0.7)
Eosinophils Relative: 0 %
HEMATOCRIT: 39.9 % (ref 35.0–47.0)
HEMOGLOBIN: 14 g/dL (ref 12.0–16.0)
LYMPHS ABS: 0.9 10*3/uL — AB (ref 1.0–3.6)
LYMPHS PCT: 13 %
MCH: 31.8 pg (ref 26.0–34.0)
MCHC: 35 g/dL (ref 32.0–36.0)
MCV: 91 fL (ref 80.0–100.0)
MONOS PCT: 3 %
Monocytes Absolute: 0.2 10*3/uL (ref 0.2–0.9)
NEUTROS PCT: 84 %
Neutro Abs: 5.6 10*3/uL (ref 1.4–6.5)
Platelets: 131 10*3/uL — ABNORMAL LOW (ref 150–440)
RBC: 4.38 MIL/uL (ref 3.80–5.20)
RDW: 12.9 % (ref 11.5–14.5)
WBC: 6.7 10*3/uL (ref 3.6–11.0)

## 2015-08-09 LAB — BLOOD GAS, VENOUS
Acid-Base Excess: 3.5 mmol/L — ABNORMAL HIGH (ref 0.0–3.0)
BICARBONATE: 29.1 meq/L — AB (ref 21.0–28.0)
O2 Saturation: 59.5 %
PCO2 VEN: 47 mmHg (ref 44.0–60.0)
PH VEN: 7.4 (ref 7.320–7.430)
PO2 VEN: 31 mmHg (ref 31.0–45.0)
Patient temperature: 37

## 2015-08-09 LAB — URINALYSIS COMPLETE WITH MICROSCOPIC (ARMC ONLY)
Bilirubin Urine: NEGATIVE
Glucose, UA: >500 mg/dL — AB
Nitrite: NEGATIVE
PH: 6 (ref 5.0–8.0)
Specific Gravity, Urine: 1.015 (ref 1.005–1.030)

## 2015-08-09 LAB — LIPASE, BLOOD: Lipase: 50 U/L (ref 11–51)

## 2015-08-09 LAB — COMPREHENSIVE METABOLIC PANEL
ALK PHOS: 130 U/L — AB (ref 38–126)
ALT: 33 U/L (ref 14–54)
ANION GAP: 13 (ref 5–15)
AST: 30 U/L (ref 15–41)
Albumin: 4.2 g/dL (ref 3.5–5.0)
BILIRUBIN TOTAL: 0.9 mg/dL (ref 0.3–1.2)
BUN: 28 mg/dL — ABNORMAL HIGH (ref 6–20)
CALCIUM: 9.9 mg/dL (ref 8.9–10.3)
CO2: 27 mmol/L (ref 22–32)
CREATININE: 1.12 mg/dL — AB (ref 0.44–1.00)
Chloride: 98 mmol/L — ABNORMAL LOW (ref 101–111)
GFR, EST NON AFRICAN AMERICAN: 53 mL/min — AB (ref >60)
Glucose, Bld: 247 mg/dL — ABNORMAL HIGH (ref 65–99)
Potassium: 3.9 mmol/L (ref 3.5–5.1)
Sodium: 138 mmol/L (ref 135–145)
Total Protein: 8.6 g/dL — ABNORMAL HIGH (ref 6.5–8.1)

## 2015-08-09 LAB — ETHANOL

## 2015-08-09 LAB — GLUCOSE, CAPILLARY
GLUCOSE-CAPILLARY: 239 mg/dL — AB (ref 65–99)
Glucose-Capillary: 159 mg/dL — ABNORMAL HIGH (ref 65–99)

## 2015-08-09 MED ORDER — PROMETHAZINE HCL 25 MG/ML IJ SOLN
25.0000 mg | Freq: Once | INTRAMUSCULAR | Status: AC
  Administered 2015-08-09: 25 mg via INTRAVENOUS
  Filled 2015-08-09: qty 1

## 2015-08-09 MED ORDER — CIPROFLOXACIN IN D5W 400 MG/200ML IV SOLN
400.0000 mg | Freq: Once | INTRAVENOUS | Status: AC
  Administered 2015-08-09: 400 mg via INTRAVENOUS
  Filled 2015-08-09: qty 200

## 2015-08-09 MED ORDER — ONDANSETRON HCL 4 MG/2ML IJ SOLN
4.0000 mg | Freq: Once | INTRAMUSCULAR | Status: AC
  Administered 2015-08-09: 4 mg via INTRAVENOUS
  Filled 2015-08-09: qty 2

## 2015-08-09 MED ORDER — MORPHINE SULFATE (PF) 4 MG/ML IV SOLN
4.0000 mg | Freq: Once | INTRAVENOUS | Status: AC
  Administered 2015-08-09: 4 mg via INTRAVENOUS
  Filled 2015-08-09: qty 1

## 2015-08-09 MED ORDER — ONDANSETRON 4 MG PO TBDP: ORAL_TABLET | ORAL | 0 refills | Status: DC

## 2015-08-09 MED ORDER — SODIUM CHLORIDE 0.9 % IV BOLUS (SEPSIS)
1000.0000 mL | Freq: Once | INTRAVENOUS | Status: AC
  Administered 2015-08-09: 1000 mL via INTRAVENOUS

## 2015-08-09 MED ORDER — CIPROFLOXACIN HCL 500 MG PO TABS: 500.0000 mg | ORAL_TABLET | Freq: Two times a day (BID) | ORAL | 0 refills | Status: AC

## 2015-08-09 NOTE — ED Notes (Signed)
Pt transported to ultrasound.

## 2015-08-09 NOTE — ED Provider Notes (Signed)
[facility] Provider Note   CSN: [id number] Arrival date & time: 08/09/15  [id number]  First Provider Contact:  First MD Initiated Contact with Patient 08/09/15 0815        History   Chief Complaint Chief Complaint  Patient presents with  . Emesis  . Nausea  . Abdominal Pain    HPI Karen [name] is a 60 y.o. female hx of DM, HL, emphysematous cystitis, alcohol pancreatitis here with vomiting. Patient drinks several beers daily at baseline. Patient states that yesterday she has 3-4 episodes of vomiting and had numerous episodes of vomiting today. She states that she is bothered without liquid But denies any blood in her vomit. States that she was unable to keep anything down. She gave herself the novolog 70/30 this morning but didn't check her blood sugar. Denies fevers or urinary symptoms. Last alcohol was yesterday and had previous alcohol pancreatitis and previous DKA.    The history is provided by the patient.    Past Medical History:  Diagnosis Date  . Alcoholic pancreatitis   . Depression   . Diabetes mellitus without complication ([place])   . DKA (diabetic ketoacidoses) ([place]) 02/17/2015  . Emphysematous cystitis   . Hypercholesteremia   . Hypokalemia     Patient Active Problem List   Diagnosis Date Noted  . DKA (diabetic ketoacidoses) ([place]) 02/17/2015  . Emphysematous cystitis     Past Surgical History:  Procedure Laterality Date  . ABDOMINAL HYSTERECTOMY  1996  . APPENDECTOMY  1997  . HAND SURGERY  1988  . THYROID SURGERY  2013    OB History    No data available       Home Medications    Prior to Admission medications   Medication Sig Start Date End Date Taking? Authorizing Provider  insulin aspart protamine - aspart (NOVOLOG MIX 70/30 FLEXPEN) (70-30) 100 UNIT/ML FlexPen Inject 15 Units into the skin 2 (two) times daily.   Yes Historical Provider, MD  oxyCODONE-acetaminophen (PERCOCET) 5-325 MG tablet Take 2 tablets by mouth every 6 (six) hours  as needed for moderate pain or severe pain. 05/14/15  Yes Earleen Newport, MD    Family History No family history on file.  Social History Social History  Substance Use Topics  . Smoking status: Former Research scientist (life sciences)  . Smokeless tobacco: Never Used  . Alcohol use Yes     Comment: daily     Allergies   Penicillins and Percocet [oxycodone-acetaminophen]   Review of Systems Review of Systems  Gastrointestinal: Positive for abdominal pain and vomiting.  All other systems reviewed and are negative.    Physical Exam Updated Vital Signs BP (!) 165/82   Pulse 79   Resp 17   SpO2 97%   Physical Exam  Constitutional:  Uncomfortable, dehydrated   HENT:  Head: Normocephalic.  MM slightly dry   Eyes: EOM are normal. Pupils are equal, round, and reactive to light.  Neck: Normal range of motion. Neck supple.  Cardiovascular: Normal rate, regular rhythm and normal heart sounds.   Pulmonary/Chest: Effort normal and breath sounds normal.  Abdominal: Soft. Bowel sounds are normal.  Mild epigastric tenderness   Musculoskeletal: Normal range of motion.  Neurological: She is alert.  Skin: Skin is warm.  Psychiatric: She has a normal mood and affect.  Nursing note and vitals reviewed.    ED Treatments / Results  Labs (all labs ordered are listed, but only abnormal results are displayed) Labs Reviewed  CBC WITH DIFFERENTIAL/PLATELET - Abnormal;  Notable for the following:       Result Value   Platelets 131 (*)    Lymphs Abs 0.9 (*)    All other components within normal limits  COMPREHENSIVE METABOLIC PANEL - Abnormal; Notable for the following:    Chloride 98 (*)    Glucose, Bld 247 (*)    BUN 28 (*)    Creatinine, Ser 1.12 (*)    Total Protein 8.6 (*)    Alkaline Phosphatase 130 (*)    GFR calc non Af Amer 53 (*)    All other components within normal limits  URINALYSIS COMPLETEWITH MICROSCOPIC ([facility] ONLY) - Abnormal; Notable for the following:    Color, Urine YELLOW (*)      APPearance CLOUDY (*)    Glucose, UA >500 (*)    Ketones, ur 1+ (*)    Hgb urine dipstick 2+ (*)    Protein, ur >500 (*)    Leukocytes, UA 1+ (*)    Bacteria, UA RARE (*)    Squamous Epithelial / LPF 6-30 (*)    All other components within normal limits  BLOOD GAS, VENOUS - Abnormal; Notable for the following:    Bicarbonate 29.1 (*)    Acid-Base Excess 3.5 (*)    All other components within normal limits  GLUCOSE, CAPILLARY - Abnormal; Notable for the following:    Glucose-Capillary 239 (*)    All other components within normal limits  GLUCOSE, CAPILLARY - Abnormal; Notable for the following:    Glucose-Capillary 159 (*)    All other components within normal limits  LIPASE, BLOOD  ETHANOL  CBG MONITORING, ED  CBG MONITORING, ED    EKG  EKG Interpretation None       Radiology US Abdomen Complete  Result Date: 08/09/2015 CLINICAL DATA:  Abdominal pain with elevated liver enzymes EXAM: ABDOMEN ULTRASOUND COMPLETE COMPARISON:  CT abdomen and pelvis May 14, 2015 FINDINGS: Gallbladder: There is a nonmobile echogenic focus along the wall of the gallbladder measuring just over 2 mm in size, likely a polyp. There are no echogenic foci in the gallbladder which move and shadow as is expected with gallstones. No gallbladder wall thickening or pericholecystic fluid evident. No sonographic Murphy sign noted by sonographer. Common bile duct: Diameter: 7 mm. No mass or calculus evident in the biliary ductal system. Liver: There is an echogenic lesion near the dome of the liver on the right measuring 1.1 x 1.1 cm which corresponds to a similar appearing lesion on CT. No other focal liver lesions are evident. Within normal limits in parenchymal echogenicity. IVC: No abnormality visualized. Pancreas: No appreciable mass or inflammatory focus. Spleen: Size and appearance within normal limits. Right Kidney: Length: 9.5 cm. Echogenicity within normal limits. No mass or hydronephrosis visualized. Left  Kidney: Length: 10.5 cm. Echogenicity within normal limits. No mass or hydronephrosis visualized. Abdominal aorta: No aneurysm visualized. Other findings: No demonstrable ascites. IMPRESSION: Probable polyp along the gallbladder wall measuring just over 2 mm in size. Gallbladder otherwise appears normal. No gallstones or gallbladder wall thickening. No pericholecystic fluid. Small echogenic focus in the liver near the dome, likely a small hemangioma. It may be prudent to consider follow-up ultrasound liver in approximately 1 year to assess for stability. Liver otherwise appears normal. Study otherwise unremarkable. Electronically Signed   By: Lowella Grip III M.D.   On: 08/09/2015 10:20   Dg Abd Acute W/chest  Result Date: 08/09/2015 CLINICAL DATA:  Onset of generalized abdominal pain and vomiting yesterday. Patient has  history of diabetes, alcoholic pancreatitis, ketoacidosis, former smoker. EXAM: DG ABDOMEN ACUTE W/ 1V CHEST COMPARISON:  Abdominal and pelvic CT scan of May 14, 2015 and chest x-ray of August 11, 2013. FINDINGS: The lungs are well-expanded. There is no focal infiltrate. There is no pleural effusion. The heart and pulmonary vascularity are normal. The mediastinum is normal in width. There surgical clips in the right paratracheal region. Within the abdomen the stomach contains a moderate amount of gas but does not appear abnormally distended. There is a moderate amount of gas in the true bony pelvis compatible with known urinary bladder gas. Small amounts of gas are noted elsewhere. The stool burden is not excessive. There are no abnormal intra-abdominal soft tissue calcifications. A known small upper pole stone in the right kidney is not clearly evident on today's study. The bony structures are unremarkable. IMPRESSION: 1. There is no active cardiopulmonary disease. 2. No acute bowel abnormality is observed. There remains gaseous distention of the urinary bladder. Electronically Signed   By:  Nikai Quest  [place] M.D.   On: 08/09/2015 09:06    Procedures Procedures (including critical care time)  Medications Ordered in ED Medications  sodium chloride 0.9 % bolus 1,000 mL (0 mLs Intravenous Stopped 08/09/15 1030)  ondansetron (ZOFRAN) injection 4 mg (4 mg Intravenous Given 08/09/15 0840)  promethazine (PHENERGAN) injection 25 mg (25 mg Intravenous Given 08/09/15 0839)  morphine 4 MG/ML injection 4 mg (4 mg Intravenous Given 08/09/15 0841)  ciprofloxacin (CIPRO) IVPB 400 mg (400 mg Intravenous New Bag/Given 08/09/15 1054)     Initial Impression / Assessment and Plan / ED Course  I have reviewed the triage vital signs and the nursing notes.  Pertinent labs & imaging results that were available during my care of the patient were reviewed by me and considered in my medical decision making (see chart for details).  Clinical Course    Karen [name] is a 60 y.o. female here with epigastric pain, diarrhea, vomiting. Consider gastro vs ileus vs DKA vs alcohol pancreatitis. Low suspicion for SBO. Will get labs, ETOH, lipase, UA, xray abdominal series. Will give nausea and pain meds and IVF and reassess.   12:01 PM UA + UTI. US showed known emphysematous cystitis, gallbladder polyp and known liver hemangioma. CBG decreased to 159 from 250. Tolerated PO. Given cipro (has anaphylaxis to PCN) and previous urine culture pan sensitive. Will dc home with cipro, zofran. Will have her follow up outpatient regarding the gallbladder polyp and hemangioma in the liver.    Final Clinical Impressions(s) / ED Diagnoses   Final diagnoses:  None    New Prescriptions New Prescriptions   No medications on file     Drenda Freeze, MD 08/09/15 1203

## 2015-08-09 NOTE — ED Notes (Signed)
Pt transported to xray 

## 2015-08-09 NOTE — Discharge Instructions (Signed)
Stay hydrated.   Take zofran as needed for nausea.   Take cipro twice daily for a week for urinary track infection.   See your doctor.   You have liver hemangioma and gallbladder polyp that needs follow up with another ultrasound as determined by your doctor.   Return to ER if you have worse abdominal pain, vomiting, fever, blood sugar > 500

## 2015-08-09 NOTE — ED Triage Notes (Signed)
Pt with abd pain and vomting started yesterday. Pt is diabetic and takes insulin.

## 2015-10-29 ENCOUNTER — Inpatient Hospital Stay
Admission: EM | Admit: 2015-10-29 | Discharge: 2015-11-01 | DRG: 305 | Disposition: A | Discharge status: Home / Self Care | Payer: Medicaid Other | Attending: Internal Medicine | Admitting: Internal Medicine

## 2015-10-29 ENCOUNTER — Encounter: Payer: Self-pay | Admitting: Emergency Medicine

## 2015-10-29 ENCOUNTER — Inpatient Hospital Stay: Payer: Medicaid Other

## 2015-10-29 DIAGNOSIS — N289 Disorder of kidney and ureter, unspecified: Secondary | ICD-10-CM

## 2015-10-29 DIAGNOSIS — Z794 Long term (current) use of insulin: Secondary | ICD-10-CM

## 2015-10-29 DIAGNOSIS — E7800 Pure hypercholesterolemia, unspecified: Secondary | ICD-10-CM | POA: Diagnosis present

## 2015-10-29 DIAGNOSIS — I674 Hypertensive encephalopathy: Secondary | ICD-10-CM | POA: Diagnosis present

## 2015-10-29 DIAGNOSIS — R079 Chest pain, unspecified: Secondary | ICD-10-CM | POA: Diagnosis not present

## 2015-10-29 DIAGNOSIS — R778 Other specified abnormalities of plasma proteins: Secondary | ICD-10-CM

## 2015-10-29 DIAGNOSIS — Z23 Encounter for immunization: Secondary | ICD-10-CM | POA: Diagnosis not present

## 2015-10-29 DIAGNOSIS — I10 Essential (primary) hypertension: Secondary | ICD-10-CM

## 2015-10-29 DIAGNOSIS — R112 Nausea with vomiting, unspecified: Secondary | ICD-10-CM | POA: Diagnosis not present

## 2015-10-29 DIAGNOSIS — Z87891 Personal history of nicotine dependence: Secondary | ICD-10-CM | POA: Diagnosis not present

## 2015-10-29 DIAGNOSIS — R1110 Vomiting, unspecified: Secondary | ICD-10-CM

## 2015-10-29 DIAGNOSIS — R748 Abnormal levels of other serum enzymes: Secondary | ICD-10-CM | POA: Diagnosis not present

## 2015-10-29 DIAGNOSIS — F17200 Nicotine dependence, unspecified, uncomplicated: Secondary | ICD-10-CM

## 2015-10-29 DIAGNOSIS — R197 Diarrhea, unspecified: Secondary | ICD-10-CM

## 2015-10-29 DIAGNOSIS — I160 Hypertensive urgency: Secondary | ICD-10-CM | POA: Diagnosis present

## 2015-10-29 DIAGNOSIS — R1084 Generalized abdominal pain: Secondary | ICD-10-CM | POA: Diagnosis not present

## 2015-10-29 DIAGNOSIS — I248 Other forms of acute ischemic heart disease: Secondary | ICD-10-CM | POA: Diagnosis present

## 2015-10-29 DIAGNOSIS — N390 Urinary tract infection, site not specified: Secondary | ICD-10-CM | POA: Diagnosis present

## 2015-10-29 DIAGNOSIS — N179 Acute kidney failure, unspecified: Secondary | ICD-10-CM | POA: Diagnosis present

## 2015-10-29 DIAGNOSIS — G4452 New daily persistent headache (NDPH): Secondary | ICD-10-CM | POA: Diagnosis not present

## 2015-10-29 DIAGNOSIS — K861 Other chronic pancreatitis: Secondary | ICD-10-CM | POA: Diagnosis present

## 2015-10-29 DIAGNOSIS — Z880 Allergy status to penicillin: Secondary | ICD-10-CM | POA: Diagnosis not present

## 2015-10-29 DIAGNOSIS — E1165 Type 2 diabetes mellitus with hyperglycemia: Secondary | ICD-10-CM | POA: Diagnosis present

## 2015-10-29 DIAGNOSIS — R51 Headache: Secondary | ICD-10-CM

## 2015-10-29 DIAGNOSIS — R519 Headache, unspecified: Secondary | ICD-10-CM

## 2015-10-29 DIAGNOSIS — R109 Unspecified abdominal pain: Secondary | ICD-10-CM

## 2015-10-29 DIAGNOSIS — G8929 Other chronic pain: Secondary | ICD-10-CM | POA: Diagnosis present

## 2015-10-29 DIAGNOSIS — R7989 Other specified abnormal findings of blood chemistry: Secondary | ICD-10-CM

## 2015-10-29 HISTORY — DX: Essential (primary) hypertension: I10

## 2015-10-29 HISTORY — DX: Disorder of kidney and ureter, unspecified: N28.9

## 2015-10-29 HISTORY — DX: Unspecified abdominal pain: R10.9

## 2015-10-29 LAB — COMPREHENSIVE METABOLIC PANEL
ALT: 26 U/L (ref 14–54)
AST: 20 U/L (ref 15–41)
Albumin: 4.1 g/dL (ref 3.5–5.0)
Alkaline Phosphatase: 143 U/L — ABNORMAL HIGH (ref 38–126)
Anion gap: 18 — ABNORMAL HIGH (ref 5–15)
BILIRUBIN TOTAL: 1.3 mg/dL — AB (ref 0.3–1.2)
BUN: 27 mg/dL — AB (ref 6–20)
CO2: 25 mmol/L (ref 22–32)
CREATININE: 1.23 mg/dL — AB (ref 0.44–1.00)
Calcium: 9.6 mg/dL (ref 8.9–10.3)
Chloride: 96 mmol/L — ABNORMAL LOW (ref 101–111)
GFR calc Af Amer: 55 mL/min — ABNORMAL LOW (ref >60)
GFR, EST NON AFRICAN AMERICAN: 47 mL/min — AB (ref >60)
Glucose, Bld: 302 mg/dL — ABNORMAL HIGH (ref 65–99)
POTASSIUM: 3.8 mmol/L (ref 3.5–5.1)
Sodium: 139 mmol/L (ref 135–145)
TOTAL PROTEIN: 8.5 g/dL — AB (ref 6.5–8.1)

## 2015-10-29 LAB — URINALYSIS COMPLETE WITH MICROSCOPIC (ARMC ONLY)
BILIRUBIN URINE: NEGATIVE
NITRITE: NEGATIVE
Protein, ur: >500 mg/dL — AB
SPECIFIC GRAVITY, URINE: 1.02 (ref 1.005–1.030)
pH: 5 (ref 5.0–8.0)

## 2015-10-29 LAB — TROPONIN I
TROPONIN I: 0.08 ng/mL — AB (ref <0.03)
Troponin I: 0.05 ng/mL (ref <0.03)
Troponin I: 0.07 ng/mL (ref <0.03)

## 2015-10-29 LAB — TSH: TSH: 0.905 u[IU]/mL (ref 0.350–4.500)

## 2015-10-29 LAB — CBC
HEMATOCRIT: 39.7 % (ref 35.0–47.0)
Hemoglobin: 13.9 g/dL (ref 12.0–16.0)
MCH: 31.9 pg (ref 26.0–34.0)
MCHC: 35.1 g/dL (ref 32.0–36.0)
MCV: 90.9 fL (ref 80.0–100.0)
PLATELETS: 203 10*3/uL (ref 150–440)
RBC: 4.37 MIL/uL (ref 3.80–5.20)
RDW: 11.8 % (ref 11.5–14.5)
WBC: 9.1 10*3/uL (ref 3.6–11.0)

## 2015-10-29 LAB — GLUCOSE, CAPILLARY
GLUCOSE-CAPILLARY: 316 mg/dL — AB (ref 65–99)
GLUCOSE-CAPILLARY: 286 mg/dL — AB (ref 65–99)
Glucose-Capillary: 175 mg/dL — ABNORMAL HIGH (ref 65–99)

## 2015-10-29 LAB — TRIGLYCERIDES: Triglycerides: 164 mg/dL — ABNORMAL HIGH (ref <150)

## 2015-10-29 LAB — LACTIC ACID, PLASMA: LACTIC ACID, VENOUS: 1.5 mmol/L (ref 0.5–1.9)

## 2015-10-29 LAB — LIPASE, BLOOD: Lipase: 58 U/L — ABNORMAL HIGH (ref 11–51)

## 2015-10-29 MED ORDER — ONDANSETRON HCL 4 MG/2ML IJ SOLN
4.0000 mg | Freq: Once | INTRAMUSCULAR | Status: AC | PRN
  Administered 2015-10-29: 4 mg via INTRAVENOUS
  Filled 2015-10-29: qty 2

## 2015-10-29 MED ORDER — SODIUM CHLORIDE 0.9 % IV BOLUS (SEPSIS)
1000.0000 mL | Freq: Once | INTRAVENOUS | Status: AC
  Administered 2015-10-29: 1000 mL via INTRAVENOUS

## 2015-10-29 MED ORDER — INSULIN ASPART 100 UNIT/ML ~~LOC~~ SOLN
0.0000 [IU] | Freq: Three times a day (TID) | SUBCUTANEOUS | Status: DC
  Administered 2015-10-29: 5 [IU] via SUBCUTANEOUS
  Administered 2015-10-30: 0.3 [IU] via SUBCUTANEOUS
  Administered 2015-10-30: 3 [IU] via SUBCUTANEOUS
  Administered 2015-10-31: 1 [IU] via SUBCUTANEOUS
  Administered 2015-10-31: 2 [IU] via SUBCUTANEOUS
  Filled 2015-10-29: qty 3
  Filled 2015-10-29: qty 1
  Filled 2015-10-29: qty 5
  Filled 2015-10-29: qty 2
  Filled 2015-10-29: qty 3

## 2015-10-29 MED ORDER — NITROGLYCERIN 2 % TD OINT
1.0000 [in_us] | TOPICAL_OINTMENT | Freq: Four times a day (QID) | TRANSDERMAL | Status: DC
  Administered 2015-10-29 – 2015-10-31 (×8): 1 [in_us] via TOPICAL
  Filled 2015-10-29 (×8): qty 1

## 2015-10-29 MED ORDER — ASPIRIN 81 MG PO CHEW
324.0000 mg | CHEWABLE_TABLET | Freq: Once | ORAL | Status: AC
  Administered 2015-10-29: 324 mg via ORAL
  Filled 2015-10-29: qty 4

## 2015-10-29 MED ORDER — INFLUENZA VAC SPLIT QUAD 0.5 ML IM SUSY
0.5000 mL | PREFILLED_SYRINGE | INTRAMUSCULAR | Status: AC
  Administered 2015-10-30: 0.5 mL via INTRAMUSCULAR
  Filled 2015-10-29: qty 0.5

## 2015-10-29 MED ORDER — INSULIN ASPART 100 UNIT/ML ~~LOC~~ SOLN: 3.0000 [IU] | Freq: Three times a day (TID) | SUBCUTANEOUS | Status: DC

## 2015-10-29 MED ORDER — AMLODIPINE BESYLATE 5 MG PO TABS
5.0000 mg | ORAL_TABLET | Freq: Two times a day (BID) | ORAL | Status: DC
  Administered 2015-10-29 – 2015-10-30 (×2): 5 mg via ORAL
  Filled 2015-10-29 (×2): qty 1

## 2015-10-29 MED ORDER — ENOXAPARIN SODIUM 40 MG/0.4ML ~~LOC~~ SOLN
40.0000 mg | SUBCUTANEOUS | Status: DC
  Administered 2015-10-29 – 2015-10-31 (×3): 40 mg via SUBCUTANEOUS
  Filled 2015-10-29 (×3): qty 0.4

## 2015-10-29 MED ORDER — METOCLOPRAMIDE HCL 5 MG/ML IJ SOLN
5.0000 mg | Freq: Four times a day (QID) | INTRAMUSCULAR | Status: DC
  Administered 2015-10-29 – 2015-11-01 (×11): 5 mg via INTRAVENOUS
  Filled 2015-10-29 (×11): qty 2

## 2015-10-29 MED ORDER — ACETAMINOPHEN 325 MG PO TABS: 650.0000 mg | ORAL_TABLET | Freq: Four times a day (QID) | ORAL | Status: DC | PRN

## 2015-10-29 MED ORDER — ONDANSETRON HCL 4 MG/2ML IJ SOLN
4.0000 mg | Freq: Four times a day (QID) | INTRAMUSCULAR | Status: DC | PRN
  Administered 2015-10-29 – 2015-11-01 (×3): 4 mg via INTRAVENOUS
  Filled 2015-10-29 (×3): qty 2

## 2015-10-29 MED ORDER — INSULIN ASPART PROT & ASPART (70-30 MIX) 100 UNIT/ML ~~LOC~~ SUSP
15.0000 [IU] | Freq: Two times a day (BID) | SUBCUTANEOUS | Status: DC
  Administered 2015-10-29 – 2015-11-01 (×6): 15 [IU] via SUBCUTANEOUS
  Filled 2015-10-29 (×6): qty 15

## 2015-10-29 MED ORDER — OXYCODONE-ACETAMINOPHEN 5-325 MG PO TABS
2.0000 | ORAL_TABLET | Freq: Four times a day (QID) | ORAL | Status: DC | PRN
  Administered 2015-10-30 – 2015-11-01 (×3): 2 via ORAL
  Filled 2015-10-29 (×3): qty 2

## 2015-10-29 MED ORDER — CIPROFLOXACIN IN D5W 400 MG/200ML IV SOLN
400.0000 mg | Freq: Once | INTRAVENOUS | Status: AC
  Administered 2015-10-29: 400 mg via INTRAVENOUS
  Filled 2015-10-29: qty 200

## 2015-10-29 MED ORDER — ONDANSETRON HCL 4 MG PO TABS: 4.0000 mg | ORAL_TABLET | Freq: Four times a day (QID) | ORAL | Status: DC | PRN

## 2015-10-29 MED ORDER — ONDANSETRON HCL 4 MG/2ML IJ SOLN
4.0000 mg | Freq: Once | INTRAMUSCULAR | Status: AC
  Administered 2015-10-29: 4 mg via INTRAVENOUS
  Filled 2015-10-29: qty 2

## 2015-10-29 MED ORDER — INSULIN ASPART PROT & ASPART (70-30 MIX) 100 UNIT/ML PEN: 15.0000 [IU] | PEN_INJECTOR | Freq: Two times a day (BID) | SUBCUTANEOUS | Status: DC

## 2015-10-29 MED ORDER — SODIUM CHLORIDE 0.9% FLUSH
3.0000 mL | Freq: Two times a day (BID) | INTRAVENOUS | Status: DC
  Administered 2015-10-29 – 2015-10-31 (×3): 3 mL via INTRAVENOUS

## 2015-10-29 MED ORDER — MORPHINE SULFATE (PF) 2 MG/ML IV SOLN
2.0000 mg | Freq: Once | INTRAVENOUS | Status: AC
  Administered 2015-10-29: 2 mg via INTRAVENOUS
  Filled 2015-10-29: qty 1

## 2015-10-29 MED ORDER — MORPHINE SULFATE (PF) 2 MG/ML IV SOLN
2.0000 mg | INTRAVENOUS | Status: DC | PRN
  Administered 2015-10-29 – 2015-11-01 (×9): 2 mg via INTRAVENOUS
  Filled 2015-10-29 (×9): qty 1

## 2015-10-29 MED ORDER — PANTOPRAZOLE SODIUM 40 MG PO TBEC
40.0000 mg | DELAYED_RELEASE_TABLET | Freq: Two times a day (BID) | ORAL | Status: DC
  Administered 2015-10-29 – 2015-11-01 (×5): 40 mg via ORAL
  Filled 2015-10-29 (×5): qty 1

## 2015-10-29 MED ORDER — METOPROLOL TARTRATE 25 MG PO TABS
25.0000 mg | ORAL_TABLET | Freq: Four times a day (QID) | ORAL | Status: DC
  Administered 2015-10-29 – 2015-10-30 (×4): 25 mg via ORAL
  Filled 2015-10-29 (×4): qty 1

## 2015-10-29 MED ORDER — MORPHINE SULFATE (PF) 4 MG/ML IV SOLN
4.0000 mg | Freq: Once | INTRAVENOUS | Status: AC
  Administered 2015-10-29: 4 mg via INTRAVENOUS
  Filled 2015-10-29: qty 1

## 2015-10-29 MED ORDER — CIPROFLOXACIN HCL 500 MG PO TABS
500.0000 mg | ORAL_TABLET | Freq: Two times a day (BID) | ORAL | Status: DC
  Administered 2015-10-29 – 2015-11-01 (×5): 500 mg via ORAL
  Filled 2015-10-29 (×5): qty 1

## 2015-10-29 MED ORDER — INSULIN ASPART 100 UNIT/ML ~~LOC~~ SOLN: 0.0000 [IU] | Freq: Every day | SUBCUTANEOUS | Status: DC

## 2015-10-29 MED ORDER — ACETAMINOPHEN 650 MG RE SUPP: 650.0000 mg | Freq: Four times a day (QID) | RECTAL | Status: DC | PRN

## 2015-10-29 MED ORDER — ASPIRIN EC 325 MG PO TBEC
325.0000 mg | DELAYED_RELEASE_TABLET | Freq: Every day | ORAL | Status: DC
  Administered 2015-10-30 – 2015-11-01 (×3): 325 mg via ORAL
  Filled 2015-10-29 (×3): qty 1

## 2015-10-29 NOTE — ED Triage Notes (Signed)
Pt with vomiting and nausea started yesterday, unable to keep anything down

## 2015-10-29 NOTE — Progress Notes (Signed)
    Reviewed chart. Will trend troponin level and reassess in the morning to discuss with on coming hospitalist. For now, recommend aggressive treatment of non-cardiac issues. Discussed with MD.

## 2015-10-29 NOTE — Progress Notes (Signed)
KUB shows possible ileus, text page sent to Dr. Ether Griffins to convey results.

## 2015-10-29 NOTE — Progress Notes (Signed)
Pt. admitted to unit, [id number] from ED, report from [place]. Oriented to room, call bell, Ascom phones and staff. Bed in low position. Fall safety plan reviewed, brown non-skid socks in place. Full assessment to Epic; skin assessed with [place]. Telemetry box verified with CCMD and Lanelle Bal T., NT: [id number]. Will continue to monitor.   Patient reporting 10/10 abdominal pain and headache, still continues to have nausea. Patient declines PO pain medication, asking for something IV because she states she is not sure she can keep it down. Dr. Ether Griffins paged and made aware, orders placed.

## 2015-10-29 NOTE — ED Provider Notes (Signed)
[facility] Emergency Department Provider Note ____________________________________________   I have reviewed the triage vital signs and the triage nursing note.  HISTORY  Chief Complaint Emesis   Historian Patient and boyfriend  HPI Karen [name] is a 60 y.o. female who states that she started having nausea and multiple episodes of vomiting last night around 7 PM it continued all night. Nonbloody and nonbilious emesis. She's had a few episodes of watery diarrhea. No fever. She's had abdominal cramping which is moderate.  She states that this feels similar to an episode a couple months ago when she was told that she had appendicitis, or an infection, but ultimately it sounds like she did not have any surgery and was sent home on antibiotics.  No coughing or trouble breathing. No significant travel history. No known bad food. No sick contacts.    Past Medical History:  Diagnosis Date  . Alcoholic pancreatitis   . Depression   . Diabetes mellitus without complication ([place])   . DKA (diabetic ketoacidoses) ([place]) 02/17/2015  . Emphysematous cystitis   . Hypercholesteremia   . Hypokalemia     Patient Active Problem List   Diagnosis Date Noted  . DKA (diabetic ketoacidoses) ([place]) 02/17/2015  . Emphysematous cystitis     Past Surgical History:  Procedure Laterality Date  . ABDOMINAL HYSTERECTOMY  1996  . APPENDECTOMY  1997  . HAND SURGERY  1988  . THYROID SURGERY  2013    Prior to Admission medications   Medication Sig Start Date End Date Taking? Authorizing Provider  insulin aspart protamine - aspart (NOVOLOG MIX 70/30 FLEXPEN) (70-30) 100 UNIT/ML FlexPen Inject 15 Units into the skin 2 (two) times daily.   Yes Historical Provider, MD  oxyCODONE-acetaminophen (PERCOCET) 5-325 MG tablet Take 2 tablets by mouth every 6 (six) hours as needed for moderate pain or severe pain. 05/14/15  Yes Earleen Newport, MD    Allergies  Allergen Reactions  .  Penicillins Anaphylaxis    Has patient had a PCN reaction causing immediate rash, facial/tongue/throat swelling, SOB or lightheadedness with hypotension: Yes Has patient had a PCN reaction causing severe rash involving mucus membranes or skin necrosis: No Has patient had a PCN reaction that required hospitalization No Has patient had a PCN reaction occurring within the last 10 years: No If all of the above answers are "NO", then may proceed with Cephalosporin use.  Marland Kitchen Percocet [Oxycodone-Acetaminophen] Rash  States that she has tolerated morphine in the past.  No family history on file.  Social History Social History  Substance Use Topics  . Smoking status: Former Research scientist (life sciences)  . Smokeless tobacco: Never Used  . Alcohol use Yes     Comment: daily    Review of Systems  Constitutional: Negative for fever. Eyes: Negative for visual changes. ENT: Negative for sore throat. Cardiovascular: Negative for chest pain. Respiratory: Negative for shortness of breath. Gastrointestinal: As per history of present illness. Genitourinary: Negative for dysuria. Musculoskeletal: Negative for back pain. Skin: Negative for rash. Neurological: Negative for headache. 10 point Review of Systems otherwise negative ____________________________________________   PHYSICAL EXAM:  VITAL SIGNS: ED Triage Vitals  Enc Vitals Group     BP 10/29/15 0834 (!) 168/79     Pulse Rate 10/29/15 0834 (!) 106     Resp 10/29/15 0834 20     Temp 10/29/15 0834 98.5 F (36.9 C)     Temp Source 10/29/15 0834 Oral     SpO2 10/29/15 0834 100 %  Weight 10/29/15 0835 114 lb (51.7 kg)     Height 10/29/15 0835 5\' 1"  (1.549 m)     Head Circumference --      Peak Flow --      Pain Score 10/29/15 0835 10     Pain Loc --      Pain Edu? --      Excl. in [place]? --      Constitutional: Alert and oriented. Well appearing and in no distress. HEENT   Head: Normocephalic and atraumatic.      Eyes: Conjunctivae are normal.  PERRL. Normal extraocular movements.      Ears:         Nose: No congestion/rhinnorhea.   Mouth/Throat: Mucous membranes are Mildly dry.   Neck: No stridor. Cardiovascular/Chest: Slightly tachycardic, regular rhythm.  No murmurs, rubs, or gallops. Respiratory: Normal respiratory effort without tachypnea nor retractions. Breath sounds are clear and equal bilaterally. No wheezes/rales/rhonchi. Gastrointestinal: Soft. No distention, no guarding, no rebound. Nontender is in the upper abdomen, moderate tenderness mid and left lower quadrant.  Genitourinary/rectal:Deferred Musculoskeletal: Nontender with normal range of motion in all extremities. No joint effusions.  No lower extremity tenderness.  No edema. Neurologic:  Normal speech and language. No gross or focal neurologic deficits are appreciated. Skin:  Skin is warm, dry and intact. No rash noted. Psychiatric: Mood and affect are normal. Speech and behavior are normal. Patient exhibits appropriate insight and judgment.   ____________________________________________  LABS (pertinent positives/negatives)  Labs Reviewed  GLUCOSE, CAPILLARY - Abnormal; Notable for the following:       Result Value   Glucose-Capillary 316 (*)    All other components within normal limits  LIPASE, BLOOD - Abnormal; Notable for the following:    Lipase 58 (*)    All other components within normal limits  COMPREHENSIVE METABOLIC PANEL - Abnormal; Notable for the following:    Chloride 96 (*)    Glucose, Bld 302 (*)    BUN 27 (*)    Creatinine, Ser 1.23 (*)    Total Protein 8.5 (*)    Alkaline Phosphatase 143 (*)    Total Bilirubin 1.3 (*)    GFR calc non Af Amer 47 (*)    GFR calc Af Amer 55 (*)    Anion gap 18 (*)    All other components within normal limits  URINALYSIS COMPLETEWITH MICROSCOPIC ([facility] ONLY) - Abnormal; Notable for the following:    Color, Urine YELLOW (*)    APPearance CLOUDY (*)    Glucose, UA >500 (*)    Ketones, ur 1+ (*)     Hgb urine dipstick 1+ (*)    Protein, ur >500 (*)    Leukocytes, UA TRACE (*)    Bacteria, UA MANY (*)    Squamous Epithelial / LPF 6-30 (*)    All other components within normal limits  TROPONIN I - Abnormal; Notable for the following:    Troponin I 0.05 (*)    All other components within normal limits  URINE CULTURE  CULTURE, BLOOD (ROUTINE X 2)  CULTURE, BLOOD (ROUTINE X 2)  CBC    ____________________________________________    EKG I, Lisa Roca, MD, the attending physician have personally viewed and interpreted all ECGs.  96 bpm. Normal sinus rhythm. Narrow QRS. Normal axis. Normal ST and T-wave ____________________________________________  RADIOLOGY All Xrays were viewed by me. Imaging interpreted by Radiologist.  None __________________________________________  PROCEDURES  Procedure(s) performed: None  Critical Care performed: None  ____________________________________________  ED COURSE / ASSESSMENT AND PLAN  Pertinent labs & imaging results that were available during my care of the patient were reviewed by me and considered in my medical decision making (see chart for details).   Ms. Kania is here with multiple episodes of emesis overnight and does look slightly clinically dehydrated. She's had a little bit of diarrhea raising the concern and suspicion for viral gastroenteritis. However, she is having a moderate amount of abdominal discomfort and I am going to review her past history where she states that she had appendicitis that maybe was treated with antibiotics.  In any case, I'm going to give her IV fluids, Zofran, and morphine. She states that she's had morphine before.  Review of charts and imaging available from February and May and August, it sounds like she has had pancreatitis, sepsis, DKA, emphysematous cystitis and so her differential is broad.  She also has a history of drinking alcohol.  Upper studies show mild acute renal failure.  Again patient is being hydrated. Baseline creatinine 0.75 in the past, today 1.2.  Urinalysis is somewhat contaminated, but I suspect consistent with urinary tract infection. Patient was given Cipro as patient has an anaphylactic penicillin allergy. Urine culture was sent. I will go ahead and send blood cultures. I don't have a suspicion for sepsis at this point without fever, tachycardia, hypertension, or elevated white blood cell count.  Her troponin came back minimally elevated. Patient was given aspirin. Her EKG is reassuring.  I spoke with the hospitalist for admission.   CONSULTATIONS:   Hospitalist for admission   Patient / Family / Caregiver informed of clinical course, medical decision-making process, and agree with plan.   ___________________________________________   FINAL CLINICAL IMPRESSION(S) / ED DIAGNOSES   Final diagnoses:  Nausea vomiting and diarrhea  Troponin level elevated  Acute renal failure, unspecified acute renal failure type ([place])  Urinary tract infection without hematuria, site unspecified              Note: This dictation was prepared with Dragon dictation. Any transcriptional errors that result from this process are unintentional    Lisa Roca, MD 10/29/15 1205

## 2015-10-29 NOTE — ED Notes (Signed)
Pt states nausea and vomiting started at 1900 last night and continued through the night.

## 2015-10-29 NOTE — H&P (Addendum)
[facility] [place] NAME: Karen [name]    MR#:  [id number]  DATE OF BIRTH:  07-Nov-1955  DATE OF ADMISSION:  10/29/2015  PRIMARY CARE PHYSICIAN: Marden Noble, MD   REQUESTING/REFERRING PHYSICIAN:   CHIEF COMPLAINT:   Chief Complaint  Patient presents with  . Emesis    HISTORY OF PRESENT ILLNESS: Karen [name]  is a 60 y.o. female with a known history of Diabetes mellitus, DKA, alcoholic pancreatitis, depression, hyperlipidemia, who presents to the hospital with complaints of nausea and vomiting which started yesterday. The patient also felt feverish and chilly, sweaty and weak, complained of lower abdominal pain, also some chest pain whenever she threw up. She admitted also of headaches, which is going on for the past few days. On arrival to the hospital, she was noted to have malignant essential hypertension with systolic blood pressure of 214, mild elevation of troponin. Her urinalysis revealed pyuria, concerning for urinary tract infection, she was also complaining of left flank pain for about one week. Hospitalist services were contacted for admission.     PAST MEDICAL HISTORY:   Past Medical History:  Diagnosis Date  . Alcoholic pancreatitis   . Depression   . Diabetes mellitus without complication ([place])   . DKA (diabetic ketoacidoses) ([place]) 02/17/2015  . Emphysematous cystitis   . Hypercholesteremia   . Hypokalemia     PAST SURGICAL HISTORY: Past Surgical History:  Procedure Laterality Date  . ABDOMINAL HYSTERECTOMY  1996  . APPENDECTOMY  1997  . HAND SURGERY  1988  . THYROID SURGERY  2013    SOCIAL HISTORY:  Social History  Substance Use Topics  . Smoking status: Former Research scientist (life sciences)  . Smokeless tobacco: Never Used  . Alcohol use Yes     Comment: daily    FAMILY HISTORY: No family history on file.  DRUG ALLERGIES:  Allergies  Allergen Reactions  . Penicillins Anaphylaxis    Has patient had a PCN reaction causing  immediate rash, facial/tongue/throat swelling, SOB or lightheadedness with hypotension: Yes Has patient had a PCN reaction causing severe rash involving mucus membranes or skin necrosis: No Has patient had a PCN reaction that required hospitalization No Has patient had a PCN reaction occurring within the last 10 years: No If all of the above answers are "NO", then may proceed with Cephalosporin use.  Marland Kitchen Percocet [Oxycodone-Acetaminophen] Rash    Review of Systems  Constitutional: Positive for chills, fever and malaise/fatigue. Negative for weight loss.  HENT: Positive for congestion.   Eyes: Negative for blurred vision and double vision.  Respiratory: Negative for cough, sputum production, shortness of breath and wheezing.   Cardiovascular: Positive for chest pain and palpitations. Negative for orthopnea, leg swelling and PND.  Gastrointestinal: Positive for abdominal pain, constipation, nausea and vomiting. Negative for blood in stool and diarrhea.  Genitourinary: Positive for flank pain and frequency. Negative for dysuria, hematuria and urgency.  Musculoskeletal: Negative for falls.  Neurological: Positive for headaches. Negative for dizziness, tremors and focal weakness.  Endo/Heme/Allergies: Does not bruise/bleed easily.  Psychiatric/Behavioral: Negative for depression. The patient does not have insomnia.     MEDICATIONS AT HOME:  Prior to Admission medications   Medication Sig Start Date End Date Taking? Authorizing Provider  insulin aspart protamine - aspart (NOVOLOG MIX 70/30 FLEXPEN) (70-30) 100 UNIT/ML FlexPen Inject 15 Units into the skin 2 (two) times daily.   Yes Historical Provider, MD  oxyCODONE-acetaminophen (PERCOCET) 5-325 MG tablet Take 2  tablets by mouth every 6 (six) hours as needed for moderate pain or severe pain. 05/14/15  Yes Earleen Newport, MD      PHYSICAL EXAMINATION:   VITAL SIGNS: Blood pressure (!) 191/98, pulse (!) 102, temperature 98.5 F (36.9 C),  temperature source Oral, resp. rate 19, height 5\' 1"  (1.549 m), weight 51.7 kg (114 lb), SpO2 94 %.  GENERAL:  60 y.o.-year-old patient lying in the bed In mild distress due to flank pain on the left.  EYES: Pupils equal, round, reactive to light and accommodation. No scleral icterus. Extraocular muscles intact.  HEENT: Head atraumatic, normocephalic. Oropharynx and nasopharynx clear.  NECK:  Supple, no jugular venous distention. No thyroid enlargement, no tenderness.  LUNGS: Normal breath sounds bilaterally, no wheezing, rales,rhonchi or crepitation. No use of accessory muscles of respiration.  CARDIOVASCULAR: S1, S2 normal. No murmurs, rubs, or gallops.  ABDOMEN: Soft, tender in lower abdomen as well as left side but no rebound or guarding was noted, nondistended. Percussion was positive for on the left flank. Bowel sounds present. No organomegaly or mass.  EXTREMITIES: No pedal edema, cyanosis, or clubbing.  NEUROLOGIC: Cranial nerves II through XII are intact. Muscle strength 5/5 in all extremities. Sensation intact. Gait not checked.  PSYCHIATRIC: The patient is alert and oriented x 3.  SKIN: No obvious rash, lesion, or ulcer.   LABORATORY PANEL:   CBC  Recent Labs Lab 10/29/15 0902  WBC 9.1  HGB 13.9  HCT 39.7  PLT 203  MCV 90.9  MCH 31.9  MCHC 35.1  RDW 11.8   ------------------------------------------------------------------------------------------------------------------  Chemistries   Recent Labs Lab 10/29/15 0902  NA 139  K 3.8  CL 96*  CO2 25  GLUCOSE 302*  BUN 27*  CREATININE 1.23*  CALCIUM 9.6  AST 20  ALT 26  ALKPHOS 143*  BILITOT 1.3*   ------------------------------------------------------------------------------------------------------------------  Cardiac Enzymes  Recent Labs Lab 10/29/15 0902  TROPONINI 0.05*    ------------------------------------------------------------------------------------------------------------------  RADIOLOGY: No results found.  EKG: Orders placed or performed during the hospital encounter of 10/29/15  . ED EKG  . ED EKG   EKG in the emergency room revealed sinus rhythm at 96 bpm, normal axis, no acute ST changes, borderline prolonged QT intervals, QTC of 501 ms, no acute ST-T changes IMPRESSION AND PLAN:  Active Problems:   Acute renal insufficiency   Elevated troponin   UTI (urinary tract infection)   Left flank pain   Malignant essential hypertension #1. Malignant essential hypertension, admit patient medical floor, initiate metoprolol, lisinopril for blood pressure control, advanced medications as needed. Patient has been having headaches, get CT scan of the head to rule out pathology.  #2 Elevated troponin, likely demand ischemia, initiate metoprolol, nitroglycerin, aspirin, Lovenox, follow cardiac enzymes 3, good cardiologist involved for recommendations #3. Nausea and vomiting, questionable hypertensive encephalopathy, get CT scan of the head, supportive therapy, IV fluids #4 acute renal insufficiency, continue IV fluids, follow creatinine in the morning, gets renal ultrasound   #5. Urinary tract infection, questionable acute pyelonephritis, initiate patient on antibiotic therapy after urinary cultures are taken #6. Abdominal pain with elevated lipase, likely chronic pancreatitis exacerbation, get triglyceride level, continue IV fluids, if  patient's blood pressure improves, clear liquid diet, advance diet when pain is better controlled, follow lipase level in the morning    All the records are reviewed and case discussed with ED provider. Management plans discussed with the patient, family and they are in agreement.  CODE STATUS: Code Status History  Date Active Date Inactive Code Status Order ID Comments User Context   02/17/2015  5:04 PM 02/19/2015   6:04 PM Full Code [id number]  Nicholes Mango, MD Inpatient       TOTAL TIME TAKING CARE OF THIS PATIENT: 50 minutes.    Theodoro Grist M.D on 10/29/2015 at 2:24 PM  Between 7am to 6pm - Pager - [phone] After 6pm go to [email or web address] - password [email or web address] [facility]  Office  [phone]  CC: Primary care physician; Marden Noble, MD

## 2015-10-30 ENCOUNTER — Encounter: Payer: Self-pay | Admitting: Physician Assistant

## 2015-10-30 ENCOUNTER — Inpatient Hospital Stay (HOSPITAL_COMMUNITY)
Admit: 2015-10-30 | Discharge: 2015-10-30 | Disposition: A | Discharge status: Home / Self Care | Payer: Medicaid Other | Attending: Physician Assistant | Admitting: Physician Assistant

## 2015-10-30 DIAGNOSIS — N179 Acute kidney failure, unspecified: Secondary | ICD-10-CM

## 2015-10-30 DIAGNOSIS — I10 Essential (primary) hypertension: Secondary | ICD-10-CM

## 2015-10-30 DIAGNOSIS — R079 Chest pain, unspecified: Secondary | ICD-10-CM

## 2015-10-30 DIAGNOSIS — N390 Urinary tract infection, site not specified: Secondary | ICD-10-CM

## 2015-10-30 DIAGNOSIS — E1165 Type 2 diabetes mellitus with hyperglycemia: Secondary | ICD-10-CM

## 2015-10-30 DIAGNOSIS — R112 Nausea with vomiting, unspecified: Secondary | ICD-10-CM

## 2015-10-30 DIAGNOSIS — R109 Unspecified abdominal pain: Secondary | ICD-10-CM

## 2015-10-30 DIAGNOSIS — R748 Abnormal levels of other serum enzymes: Secondary | ICD-10-CM

## 2015-10-30 DIAGNOSIS — F17200 Nicotine dependence, unspecified, uncomplicated: Secondary | ICD-10-CM

## 2015-10-30 DIAGNOSIS — R1084 Generalized abdominal pain: Secondary | ICD-10-CM

## 2015-10-30 DIAGNOSIS — R197 Diarrhea, unspecified: Secondary | ICD-10-CM

## 2015-10-30 LAB — HEMOGLOBIN A1C
Hgb A1c MFr Bld: 9.7 % — ABNORMAL HIGH (ref 4.8–5.6)
Mean Plasma Glucose: 232 mg/dL

## 2015-10-30 LAB — GLUCOSE, CAPILLARY
GLUCOSE-CAPILLARY: 101 mg/dL — AB (ref 65–99)
Glucose-Capillary: 237 mg/dL — ABNORMAL HIGH (ref 65–99)
Glucose-Capillary: 237 mg/dL — ABNORMAL HIGH (ref 65–99)
Glucose-Capillary: 162 mg/dL — ABNORMAL HIGH (ref 65–99)

## 2015-10-30 LAB — LIPASE, BLOOD: Lipase: 126 U/L — ABNORMAL HIGH (ref 11–51)

## 2015-10-30 LAB — BASIC METABOLIC PANEL
Anion gap: 11 (ref 5–15)
BUN: 26 mg/dL — AB (ref 6–20)
CHLORIDE: 96 mmol/L — AB (ref 101–111)
CO2: 28 mmol/L (ref 22–32)
CREATININE: 1.23 mg/dL — AB (ref 0.44–1.00)
Calcium: 8.7 mg/dL — ABNORMAL LOW (ref 8.9–10.3)
GFR calc Af Amer: 55 mL/min — ABNORMAL LOW (ref >60)
GFR calc non Af Amer: 47 mL/min — ABNORMAL LOW (ref >60)
Glucose, Bld: 197 mg/dL — ABNORMAL HIGH (ref 65–99)
POTASSIUM: 3.4 mmol/L — AB (ref 3.5–5.1)
SODIUM: 135 mmol/L (ref 135–145)

## 2015-10-30 LAB — CBC
HEMATOCRIT: 39.7 % (ref 35.0–47.0)
Hemoglobin: 13.3 g/dL (ref 12.0–16.0)
MCH: 31.4 pg (ref 26.0–34.0)
MCHC: 33.6 g/dL (ref 32.0–36.0)
MCV: 93.4 fL (ref 80.0–100.0)
Platelets: 224 10*3/uL (ref 150–440)
RBC: 4.25 MIL/uL (ref 3.80–5.20)
RDW: 11.6 % (ref 11.5–14.5)
WBC: 10.4 10*3/uL (ref 3.6–11.0)

## 2015-10-30 LAB — ECHOCARDIOGRAM COMPLETE
HEIGHTINCHES: 61 in
Weight: 1702.4 oz

## 2015-10-30 LAB — TROPONIN I: TROPONIN I: 0.06 ng/mL — AB (ref <0.03)

## 2015-10-30 MED ORDER — SODIUM CHLORIDE 0.9 % IV SOLN
INTRAVENOUS | Status: DC
  Administered 2015-10-30 – 2015-10-31 (×2): via INTRAVENOUS

## 2015-10-30 MED ORDER — AMLODIPINE BESYLATE 5 MG PO TABS
5.0000 mg | ORAL_TABLET | Freq: Every day | ORAL | Status: DC
  Administered 2015-10-31 – 2015-11-01 (×2): 5 mg via ORAL
  Filled 2015-10-30 (×2): qty 1

## 2015-10-30 MED ORDER — DIPHENHYDRAMINE HCL 50 MG/ML IJ SOLN
12.5000 mg | Freq: Once | INTRAMUSCULAR | Status: AC
  Administered 2015-10-30: 12.5 mg via INTRAVENOUS
  Filled 2015-10-30: qty 0.25

## 2015-10-30 MED ORDER — HYDRALAZINE HCL 25 MG PO TABS
25.0000 mg | ORAL_TABLET | Freq: Three times a day (TID) | ORAL | Status: DC
  Administered 2015-10-30 – 2015-11-01 (×7): 25 mg via ORAL
  Filled 2015-10-30 (×7): qty 1

## 2015-10-30 MED ORDER — METOPROLOL TARTRATE 25 MG PO TABS
25.0000 mg | ORAL_TABLET | Freq: Two times a day (BID) | ORAL | Status: DC
  Administered 2015-10-30 – 2015-11-01 (×3): 25 mg via ORAL
  Filled 2015-10-30 (×3): qty 1

## 2015-10-30 MED ORDER — ALUM & MAG HYDROXIDE-SIMETH 200-200-20 MG/5ML PO SUSP
30.0000 mL | Freq: Four times a day (QID) | ORAL | Status: DC | PRN
  Administered 2015-10-31 (×2): 30 mL via ORAL
  Filled 2015-10-30 (×2): qty 30

## 2015-10-30 MED ORDER — BUTALBITAL-APAP-CAFFEINE 50-325-40 MG PO TABS
1.0000 | ORAL_TABLET | Freq: Four times a day (QID) | ORAL | Status: DC | PRN
  Administered 2015-10-31 (×2): 1 via ORAL
  Filled 2015-10-30 (×2): qty 1

## 2015-10-30 NOTE — Care Management (Signed)
patient presents with nausea and vomiting. 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  Brain MRI is pending.  Head CT negative.  Blood pressure under control.  Patient with active medicaid.  Current with his pcp.  Denies issues with transportation.

## 2015-10-30 NOTE — Consult Note (Signed)
Cardiology Consultation Note  Patient ID: Karen [name], MRN: [id number], DOB/AGE: 58-Oct-1957 58 y.o. Admit date: 10/29/2015   Date of Consult: 10/30/2015 Primary Physician: Marden Noble, MD Primary Cardiologist: New to [facility] Requesting Physician: Dr. Ether Griffins, MD  Chief Complaint: Nausea/vomiting/abdominal pain Reason for Consult: Elevated troponin   HPI: 60 y.o. female with h/o DM with DKA, alcoholic pancreatitis, HLD, and depression who presented to [facility] on 10/24 with abdominal pain associated with nausea and vomiting.   No prior known cardiac history. She developed abdominal pain, nausea, and vomiting on 10/23. Also some associated fever and chills. Vomiting has led to headaches. She reports vomiting every hour since 10/23. No diarrhea. Last BM on the morning of 10/24.   Because of her nausea and vomiting she presented to [facility]. Upon her arrival she was noted to have a SBP of 214 mmHg, AKI with SCr of 1.23 (baseline 0.8), BUN 27, eleavted lipase at 58 that has up trended to 126 this morning, T bili 1.3, alk phos 143 UTI. Initial troponin was found to be mildly elevated at 0.05 with a peak of 0.08 and is now down trending. EKG non-acute. Blood cultures are pending. Chest pain episodes have only been in the setting of a dry cough and when vomiting, otherwise she has been without chest pain. No SOB, diaphoresis, dizziness, presyncope, or syncope. She is still vomiting at this time. Currently without chest pain.    Past Medical History:  Diagnosis Date  . Alcoholic pancreatitis   . Depression   . Diabetes mellitus without complication ([place])   . DKA (diabetic ketoacidoses) ([place]) 02/17/2015  . Emphysematous cystitis   . Hypercholesteremia   . Hypokalemia       Most Recent Cardiac Studies: none   Surgical History:  Past Surgical History:  Procedure Laterality Date  . ABDOMINAL HYSTERECTOMY  1996  . APPENDECTOMY  1997  . HAND SURGERY  1988  . THYROID SURGERY  2013     Home  Meds: Prior to Admission medications   Medication Sig Start Date End Date Taking? Authorizing Provider  insulin aspart protamine - aspart (NOVOLOG MIX 70/30 FLEXPEN) (70-30) 100 UNIT/ML FlexPen Inject 15 Units into the skin 2 (two) times daily.   Yes Historical Provider, MD  oxyCODONE-acetaminophen (PERCOCET) 5-325 MG tablet Take 2 tablets by mouth every 6 (six) hours as needed for moderate pain or severe pain. 05/14/15  Yes Earleen Newport, MD    Inpatient Medications:  . amLODipine  5 mg Oral BID  . aspirin EC  325 mg Oral Daily  . ciprofloxacin  500 mg Oral BID  . enoxaparin (LOVENOX) injection  40 mg Subcutaneous Q24H  . Influenza vac split quadrivalent PF  0.5 mL Intramuscular Tomorrow-1000  . insulin aspart  0-5 Units Subcutaneous QHS  . insulin aspart  0-9 Units Subcutaneous TID WC  . insulin aspart protamine- aspart  15 Units Subcutaneous BID WC  . metoCLOPramide (REGLAN) injection  5 mg Intravenous Q6H  . metoprolol tartrate  25 mg Oral Q6H  . nitroGLYCERIN  1 inch Topical Q6H  . pantoprazole  40 mg Oral BID  . sodium chloride flush  3 mL Intravenous Q12H      Allergies:  Allergies  Allergen Reactions  . Penicillins Anaphylaxis    Has patient had a PCN reaction causing immediate rash, facial/tongue/throat swelling, SOB or lightheadedness with hypotension: Yes Has patient had a PCN reaction causing severe rash involving mucus membranes or skin necrosis: No Has patient had  a PCN reaction that required hospitalization No Has patient had a PCN reaction occurring within the last 10 years: No If all of the above answers are "NO", then may proceed with Cephalosporin use.  Marland Kitchen Percocet [Oxycodone-Acetaminophen] Rash    Social History   Social History  . Marital status: Single    Spouse name: N/A  . Number of children: N/A  . Years of education: N/A   Occupational History  . Not on file.   Social History Main Topics  . Smoking status: Former Research scientist (life sciences)  . Smokeless  tobacco: Never Used  . Alcohol use Yes     Comment: daily  . Drug use: Unknown  . Sexual activity: Not on file   Other Topics Concern  . Not on file   Social History Narrative  . No narrative on file     Family History  Problem Relation Age of Onset  . Family history unknown: Yes    Patient from [place], only child, parents did not seek or discuss medical care with patient  Review of Systems: Review of Systems  Constitutional: Positive for malaise/fatigue. Negative for chills, diaphoresis, fever and weight loss.  HENT: Negative for congestion.   Eyes: Negative for discharge and redness.  Respiratory: Positive for cough. Negative for hemoptysis, sputum production, shortness of breath and wheezing.   Cardiovascular: Positive for chest pain. Negative for palpitations, orthopnea, claudication, leg swelling and PND.  Gastrointestinal: Positive for abdominal pain, nausea and vomiting. Negative for blood in stool, heartburn and melena.  Genitourinary: Negative for hematuria.  Musculoskeletal: Negative for falls and myalgias.  Skin: Negative for rash.  Neurological: Positive for weakness. Negative for dizziness, tingling, tremors, sensory change, speech change, focal weakness and loss of consciousness.  Endo/Heme/Allergies: Does not bruise/bleed easily.  Psychiatric/Behavioral: Negative for substance abuse. The patient is not nervous/anxious.   All other systems reviewed and are negative.   Labs:  Recent Labs  10/29/15 0902 10/29/15 1552 10/29/15 2139 10/30/15 0421  TROPONINI 0.05* 0.08* 0.07* 0.06*   Lab Results  Component Value Date   WBC 10.4 10/30/2015   HGB 13.3 10/30/2015   HCT 39.7 10/30/2015   MCV 93.4 10/30/2015   PLT 224 10/30/2015     Recent Labs Lab 10/29/15 0902 10/30/15 0421  NA 139 135  K 3.8 3.4*  CL 96* 96*  CO2 25 28  BUN 27* 26*  CREATININE 1.23* 1.23*  CALCIUM 9.6 8.7*  PROT 8.5*  --   BILITOT 1.3*  --   ALKPHOS 143*  --   ALT 26  --    AST 20  --   GLUCOSE 302* 197*   Lab Results  Component Value Date   CHOL 360 (H) 02/18/2015   HDL 49 02/18/2015   LDLCALC 283 (H) 02/18/2015   TRIG 164 (H) 10/29/2015   No results found for: DDIMER  Radiology/Studies:  Dg Abd 1 View  Result Date: 10/29/2015 CLINICAL DATA:  Diffuse abdominal pain with nausea and vomiting. EXAM: ABDOMEN - 1 VIEW COMPARISON:  CT abdomen and pelvis 05/13/2012. Chest and two views abdomen 08/09/2015. FINDINGS: Single-view of the abdomen demonstrates mild gaseous prominence of small and large bowel most suggestive of ileus. No abnormal abdominal calcification is seen. No focal bony abnormality. IMPRESSION: Bowel gas pattern most suggestive of ileus. Electronically Signed   By: Inge Rise M.D.   On: 10/29/2015 17:24   Ct Head Wo Contrast  Result Date: 10/29/2015 CLINICAL DATA:  Initial evaluation for acute headache. EXAM: CT HEAD WITHOUT  CONTRAST TECHNIQUE: Contiguous axial images were obtained from the base of the skull through the vertex without intravenous contrast. COMPARISON:  None available. FINDINGS: Brain: Mild age-related cerebral atrophy with chronic microvascular ischemic disease. No acute intracranial hemorrhage. No evidence for acute large vessel territory infarct. No mass lesion, midline shift or mass effect. No hydrocephalus. No extra-axial fluid collection. Vascular: No hyperdense vessel. Scattered vascular calcifications present within the carotid siphons. Skull: Scalp soft tissues within normal limits.  Calvarium intact. Sinuses/Orbits: Visualized globes and orbits within normal limits. Visualized paranasal sinuses are clear. Left mastoid effusion noted. Right mastoid air cells clear. IMPRESSION: 1. No acute intracranial process identified. 2. Mild age-related cerebral atrophy with chronic microvascular ischemic disease. 3. Left mastoid effusion. Electronically Signed   By: Jeannine Boga M.D.   On: 10/29/2015 14:51   US  Renal  Result Date: 10/29/2015 CLINICAL DATA:  60 year old female with left flank pain. UTI. Initial encounter. EXAM: RENAL / URINARY TRACT ULTRASOUND COMPLETE COMPARISON:  08/09/2015 ultrasound.  05/14/2015 CT. FINDINGS: Right Kidney: Length: 10.3 cm. Echogenicity within normal limits. No mass or hydronephrosis visualized. Left Kidney: Length: 10.7 cm. Echogenicity within normal limits. No mass or hydronephrosis visualized. Bladder: Irregular appearance of the bladder wall. This may be related to underdistention although limited for evaluating for possibility bladder wall abnormality. IMPRESSION: No hydronephrosis. Irregular appearance of the bladder wall. This may be related to underdistention although limited for evaluating for possibility bladder wall abnormality. Electronically Signed   By: Genia Del M.D.   On: 10/29/2015 17:46    EKG: Interpreted by me showed: NSR, 96 bpm, no acute st/t changes  Telemetry: Interpreted by me showed: NSR, 70's bpm  Weights: Filed Weights   10/29/15 0835 10/30/15 0419  Weight: 114 lb (51.7 kg) 106 lb 6.4 oz (48.3 kg)     Physical Exam: Blood pressure (!) 170/77, pulse 73, temperature 98.3 F (36.8 C), temperature source Oral, resp. rate 18, height _0  (1.549 m), weight 106 lb 6.4 oz (48.3 kg), SpO2 98 %. Body mass index is 20.1 kg/m. General: Well developed, well nourished, in no acute distress. Head: Normocephalic, atraumatic, sclera non-icteric, no xanthomas, nares are without discharge.  Neck: Negative for carotid bruits. JVD not elevated. Lungs: Clear bilaterally to auscultation without wheezes, rales, or rhonchi. Breathing is unlabored. Heart: RRR with S1 S2. No murmurs, rubs, or gallops appreciated. Abdomen: Soft, non-tender, non-distended with normoactive bowel sounds. No hepatomegaly. No rebound/guarding. No obvious abdominal masses. Msk:  Strength and tone appear normal for age. Extremities: No clubbing or cyanosis. No edema. Distal pedal  pulses are 2+ and equal bilaterally. Neuro: Alert and oriented X 3. No facial asymmetry. No focal deficit. Moves all extremities spontaneously. Psych:  Responds to questions appropriately with a normal affect.    Assessment and Plan:  Active Problems:   Acute renal insufficiency   Elevated troponin   UTI (urinary tract infection)   Left flank pain   Malignant essential hypertension    1. Elevated troponin:  -Minimally elevated and flat trending likely 2/2 supply demand ischemia in the setting of the patient's malignant hypertension, AKI, UTI, ileus, and possible acute on chronic pancreatitis  -Her episodes of chest pain sound atypical in that they only occur when coughing and vomiting -Check echo  -Consider outpatient nuclear stress testing when feeling better  2. Nausea/vomiting/abdominal pain/ileus:  -IV fluids  -Las BM in the AM of 10/24 -Per IM   3. Malignant hypertension:  -Improving  -Titrate medications to goal BP  -  Add hydralazine for now, plan to transition to ACEi when renal function is back to baseline -Per IM   4. AKI:  -Likely in the setting of her dehydration with nausea of vomiting  -IV fluids  -Renal ultrasound negative  5. UTI/vs pyelonephritis:  -No leukocytosis  -Per IM   Signed, Christell Faith, PA-C [facility] Pager: [phone] 10/30/2015, 7:46 AM

## 2015-10-30 NOTE — Progress Notes (Signed)
*  PRELIMINARY RESULTS* Echocardiogram 2D Echocardiogram has been performed.  Karen [name] 10/30/2015, 1:36 PM

## 2015-10-30 NOTE — Progress Notes (Signed)
[place] at [place] NAME: Karen [name]    MR#:  [id number]  DATE OF BIRTH:  1955/04/24  SUBJECTIVE:  CHIEF COMPLAINT:   Chief Complaint  Patient presents with  . Emesis     Came with severe headache, nausea and vomiting, dizziness. Found to have severely elevated blood pressure, which is under control now. But her other complaints still remains the same.   CT scan of the head is negative.  REVIEW OF SYSTEMS:  CONSTITUTIONAL: No fever, fatigue or weakness.  EYES: No blurred or double vision.  EARS, NOSE, AND THROAT: No tinnitus or ear pain.  RESPIRATORY: No cough, shortness of breath, wheezing or hemoptysis.  CARDIOVASCULAR: No chest pain, orthopnea, edema.  GASTROINTESTINAL: Positive for nausea, vomiting, no diarrhea or abdominal pain.  GENITOURINARY: No dysuria, hematuria.  ENDOCRINE: No polyuria, nocturia,  HEMATOLOGY: No anemia, easy bruising or bleeding SKIN: No rash or lesion. MUSCULOSKELETAL: No joint pain or arthritis.   NEUROLOGIC: No tingling, numbness, weakness.  PSYCHIATRY: No anxiety or depression.   ROS  DRUG ALLERGIES:   Allergies  Allergen Reactions  . Penicillins Anaphylaxis    Has patient had a PCN reaction causing immediate rash, facial/tongue/throat swelling, SOB or lightheadedness with hypotension: Yes Has patient had a PCN reaction causing severe rash involving mucus membranes or skin necrosis: No Has patient had a PCN reaction that required hospitalization No Has patient had a PCN reaction occurring within the last 10 years: No If all of the above answers are "NO", then may proceed with Cephalosporin use.  Marland Kitchen Percocet [Oxycodone-Acetaminophen] Rash    VITALS:  Blood pressure 115/68, pulse 83, temperature 98.4 F (36.9 C), temperature source Oral, resp. rate 16, height 5\' 1"  (1.549 m), weight 48.3 kg (106 lb 6.4 oz), SpO2 100 %.  PHYSICAL EXAMINATION:  GENERAL:  60 y.o.-year-old patient lying in the bed with  no acute distress.  EYES: Pupils equal, round, reactive to light and accommodation. No scleral icterus. Extraocular muscles intact.  HEENT: Head atraumatic, normocephalic. Oropharynx and nasopharynx clear.  NECK:  Supple, no jugular venous distention. No thyroid enlargement, no tenderness.  LUNGS: Normal breath sounds bilaterally, no wheezing, rales,rhonchi or crepitation. No use of accessory muscles of respiration.  CARDIOVASCULAR: S1, S2 normal. No murmurs, rubs, or gallops.  ABDOMEN: Soft, nontender, nondistended. Bowel sounds present. No organomegaly or mass.  EXTREMITIES: No pedal edema, cyanosis, or clubbing.  NEUROLOGIC: Cranial nerves II through XII are intact. Muscle strength 5/5 in all extremities. Sensation intact. Gait not checked.  PSYCHIATRIC: The patient is alert and oriented x 3.  SKIN: No obvious rash, lesion, or ulcer.   Physical Exam LABORATORY PANEL:   CBC  Recent Labs Lab 10/30/15 0421  WBC 10.4  HGB 13.3  HCT 39.7  PLT 224   ------------------------------------------------------------------------------------------------------------------  Chemistries   Recent Labs Lab 10/29/15 0902 10/30/15 0421  NA 139 135  K 3.8 3.4*  CL 96* 96*  CO2 25 28  GLUCOSE 302* 197*  BUN 27* 26*  CREATININE 1.23* 1.23*  CALCIUM 9.6 8.7*  AST 20  --   ALT 26  --   ALKPHOS 143*  --   BILITOT 1.3*  --    ------------------------------------------------------------------------------------------------------------------  Cardiac Enzymes  Recent Labs Lab 10/29/15 2139 10/30/15 0421  TROPONINI 0.07* 0.06*   ------------------------------------------------------------------------------------------------------------------  RADIOLOGY:  Dg Abd 1 View  Result Date: 10/29/2015 CLINICAL DATA:  Diffuse abdominal pain with nausea and vomiting. EXAM: ABDOMEN - 1 VIEW COMPARISON:  CT  abdomen and pelvis 05/13/2012. Chest and two views abdomen 08/09/2015. FINDINGS: Single-view  of the abdomen demonstrates mild gaseous prominence of small and large bowel most suggestive of ileus. No abnormal abdominal calcification is seen. No focal bony abnormality. IMPRESSION: Bowel gas pattern most suggestive of ileus. Electronically Signed   By: Inge Rise M.D.   On: 10/29/2015 17:24   Ct Head Wo Contrast  Result Date: 10/29/2015 CLINICAL DATA:  Initial evaluation for acute headache. EXAM: CT HEAD WITHOUT CONTRAST TECHNIQUE: Contiguous axial images were obtained from the base of the skull through the vertex without intravenous contrast. COMPARISON:  None available. FINDINGS: Brain: Mild age-related cerebral atrophy with chronic microvascular ischemic disease. No acute intracranial hemorrhage. No evidence for acute large vessel territory infarct. No mass lesion, midline shift or mass effect. No hydrocephalus. No extra-axial fluid collection. Vascular: No hyperdense vessel. Scattered vascular calcifications present within the carotid siphons. Skull: Scalp soft tissues within normal limits.  Calvarium intact. Sinuses/Orbits: Visualized globes and orbits within normal limits. Visualized paranasal sinuses are clear. Left mastoid effusion noted. Right mastoid air cells clear. IMPRESSION: 1. No acute intracranial process identified. 2. Mild age-related cerebral atrophy with chronic microvascular ischemic disease. 3. Left mastoid effusion. Electronically Signed   By: Jeannine Boga M.D.   On: 10/29/2015 14:51   US Renal  Result Date: 10/29/2015 CLINICAL DATA:  60 year old female with left flank pain. UTI. Initial encounter. EXAM: RENAL / URINARY TRACT ULTRASOUND COMPLETE COMPARISON:  08/09/2015 ultrasound.  05/14/2015 CT. FINDINGS: Right Kidney: Length: 10.3 cm. Echogenicity within normal limits. No mass or hydronephrosis visualized. Left Kidney: Length: 10.7 cm. Echogenicity within normal limits. No mass or hydronephrosis visualized. Bladder: Irregular appearance of the bladder wall. This  may be related to underdistention although limited for evaluating for possibility bladder wall abnormality. IMPRESSION: No hydronephrosis. Irregular appearance of the bladder wall. This may be related to underdistention although limited for evaluating for possibility bladder wall abnormality. Electronically Signed   By: Genia Del M.D.   On: 10/29/2015 17:46    ASSESSMENT AND PLAN:   Active Problems:   Acute renal insufficiency   Elevated troponin   Urinary tract infection without hematuria   Left flank pain   Malignant essential hypertension   Abdominal pain   Acute renal failure (HCC)   Nausea vomiting and diarrhea   Smoker   Poorly controlled type 2 diabetes mellitus ([place])  #1. Malignant essential hypertension,   metoprolol, hydralazine and amlodipine for blood pressure control,    Blood pressure is under control now. Appreciated cardiology consult. #2 Elevated troponin,   likely demand ischemia, initiate metoprolol, nitroglycerin, aspirin, Lovenox, followed cardiac enzymes 3,  cardiologist involved for recommendations #3. Nausea and vomiting, headache  questionable hypertensive encephalopathy, negative CT scan of the head, supportive therapy, IV fluids   May be migraine. We'll give IV Benadryl one dose and oral Fioricet as needed.   Also check an MRI of the brain as in spite of blood pressure being normal patient's complaint is still not resolving. #4 acute renal insufficiency, continue IV fluids, follow creatinine in the morning, gets renal ultrasound   #5. Urinary tract infection, questionable acute pyelonephritis,   Ur cx have gram neg rods, On cipro. #6. Abdominal pain with elevated lipase, likely chronic pancreatitis exacerbation, normal triglyceride level, continue IV fluids, clear liquid diet, advance diet when pain is better controlled,   lipase is elevated, Get RUQ sono.   Follow LFTs.   All the records are reviewed and case discussed with  Care Management/Social  Workerr. Management plans discussed with the patient, family and they are in agreement.  CODE STATUS: Full.  TOTAL TIME TAKING CARE OF THIS PATIENT: 40 minutes.     POSSIBLE D/C IN 1-2 DAYS, DEPENDING ON CLINICAL CONDITION.   Vaughan Basta M.D on 10/30/2015   Between 7am to 6pm - Pager - [phone]  After 6pm go to [email or web address] - password [email or web address] [facility]  Office  [phone]  CC: Primary care physician; Marden Noble, MD  Note: This dictation was prepared with Dragon dictation along with smaller phrase technology. Any transcriptional errors that result from this process are unintentional.

## 2015-10-31 ENCOUNTER — Inpatient Hospital Stay: Payer: Medicaid Other

## 2015-10-31 LAB — COMPREHENSIVE METABOLIC PANEL
ALT: 18 U/L (ref 14–54)
ANION GAP: 7 (ref 5–15)
AST: 21 U/L (ref 15–41)
Albumin: 3.4 g/dL — ABNORMAL LOW (ref 3.5–5.0)
Alkaline Phosphatase: 89 U/L (ref 38–126)
BUN: 22 mg/dL — ABNORMAL HIGH (ref 6–20)
CHLORIDE: 104 mmol/L (ref 101–111)
CO2: 28 mmol/L (ref 22–32)
CREATININE: 1.13 mg/dL — AB (ref 0.44–1.00)
Calcium: 8.7 mg/dL — ABNORMAL LOW (ref 8.9–10.3)
GFR, EST NON AFRICAN AMERICAN: 53 mL/min — AB (ref >60)
Glucose, Bld: 91 mg/dL (ref 65–99)
POTASSIUM: 3.4 mmol/L — AB (ref 3.5–5.1)
Sodium: 139 mmol/L (ref 135–145)
Total Bilirubin: 0.8 mg/dL (ref 0.3–1.2)
Total Protein: 6.8 g/dL (ref 6.5–8.1)

## 2015-10-31 LAB — GLUCOSE, CAPILLARY
GLUCOSE-CAPILLARY: 147 mg/dL — AB (ref 65–99)
GLUCOSE-CAPILLARY: 68 mg/dL (ref 65–99)
GLUCOSE-CAPILLARY: 163 mg/dL — AB (ref 65–99)
Glucose-Capillary: 92 mg/dL (ref 65–99)

## 2015-10-31 MED ORDER — LACTULOSE 10 GM/15ML PO SOLN
30.0000 g | Freq: Once | ORAL | Status: AC
  Administered 2015-10-31: 30 g via ORAL
  Filled 2015-10-31: qty 60

## 2015-10-31 NOTE — Progress Notes (Addendum)
Patient's B.S. noted to be only 74. Asymptomatic. Lunch already ordered. Will offer juice / snack at this time. [place]   Patient now eating both applesauce and a cup of vanilla ice cream. Lunch tray on the way. Will continue to monitor. Wenda Low [facility]

## 2015-10-31 NOTE — Plan of Care (Signed)
Problem: Safety: Goal: Ability to remain free from injury will improve Outcome: Progressing Up in room with standby assistance.  Gait steady.  Problem: Phase I Progression Outcomes Goal: Hemodynamically stable Outcome: Progressing Continues to complain of headache.  VSS.

## 2015-10-31 NOTE — Care Management (Signed)
Spoke with attending and informed that there is concern that patient may have ileus.  Patient was NPO and scheduled for abd ultra sound which is negative for ileus.  Clarified with attending that there were no further procedures planned for today. Patient's diet progressed to soft.

## 2015-10-31 NOTE — Progress Notes (Signed)
[place] at [place] NAME: Karen [name]    MR#:  [id number]  DATE OF BIRTH:  1955-09-07  SUBJECTIVE:  CHIEF COMPLAINT:   Chief Complaint  Patient presents with  . Emesis     Came with severe headache, nausea and vomiting, dizziness. Found to have severely elevated blood pressure, which is under control now.  Less dizzy today.  No vomiting  REVIEW OF SYSTEMS:  CONSTITUTIONAL: No fever, fatigue or weakness.  EYES: No blurred or double vision.  EARS, NOSE, AND THROAT: No tinnitus or ear pain.  RESPIRATORY: No cough, shortness of breath, wheezing or hemoptysis.  CARDIOVASCULAR: No chest pain, orthopnea, edema.  GASTROINTESTINAL: Positive for nausea, vomiting, no diarrhea or abdominal pain.  GENITOURINARY: No dysuria, hematuria.  ENDOCRINE: No polyuria, nocturia,  HEMATOLOGY: No anemia, easy bruising or bleeding SKIN: No rash or lesion. MUSCULOSKELETAL: No joint pain or arthritis.   NEUROLOGIC: No tingling, numbness, weakness.  PSYCHIATRY: No anxiety or depression.   ROS  DRUG ALLERGIES:   Allergies  Allergen Reactions  . Penicillins Anaphylaxis    Has patient had a PCN reaction causing immediate rash, facial/tongue/throat swelling, SOB or lightheadedness with hypotension: Yes Has patient had a PCN reaction causing severe rash involving mucus membranes or skin necrosis: No Has patient had a PCN reaction that required hospitalization No Has patient had a PCN reaction occurring within the last 10 years: No If all of the above answers are "NO", then may proceed with Cephalosporin use.  Marland Kitchen Percocet [Oxycodone-Acetaminophen] Rash    VITALS:  Blood pressure (!) 141/78, pulse 83, temperature 98.4 F (36.9 C), temperature source Oral, resp. rate 14, height 5\' 1"  (1.549 m), weight 50 kg (110 lb 3.2 oz), SpO2 95 %.  PHYSICAL EXAMINATION:  GENERAL:  60 y.o.-year-old patient lying in the bed with no acute distress.  EYES: Pupils equal, round,  reactive to light and accommodation. No scleral icterus. Extraocular muscles intact.  HEENT: Head atraumatic, normocephalic. Oropharynx and nasopharynx clear.  NECK:  Supple, no jugular venous distention. No thyroid enlargement, no tenderness.  LUNGS: Normal breath sounds bilaterally, no wheezing, rales,rhonchi or crepitation. No use of accessory muscles of respiration.  CARDIOVASCULAR: S1, S2 normal. No murmurs, rubs, or gallops.  ABDOMEN: Soft, nontender, nondistended. Bowel sounds present. No organomegaly or mass.  EXTREMITIES: No pedal edema, cyanosis, or clubbing.  NEUROLOGIC: Cranial nerves II through XII are intact. Muscle strength 5/5 in all extremities. Sensation intact. Gait not checked.  PSYCHIATRIC: The patient is alert and oriented x 3.  SKIN: No obvious rash, lesion, or ulcer.   Physical Exam LABORATORY PANEL:   CBC  Recent Labs Lab 10/30/15 0421  WBC 10.4  HGB 13.3  HCT 39.7  PLT 224   ------------------------------------------------------------------------------------------------------------------  Chemistries   Recent Labs Lab 10/31/15 0437  NA 139  K 3.4*  CL 104  CO2 28  GLUCOSE 91  BUN 22*  CREATININE 1.13*  CALCIUM 8.7*  AST 21  ALT 18  ALKPHOS 89  BILITOT 0.8   ------------------------------------------------------------------------------------------------------------------  Cardiac Enzymes  Recent Labs Lab 10/29/15 2139 10/30/15 0421  TROPONINI 0.07* 0.06*   ------------------------------------------------------------------------------------------------------------------  RADIOLOGY:  Mr Brain Wo Contrast  Result Date: 10/31/2015 CLINICAL DATA:  Headaches for a few days. Malignant hypertension. History of diabetes. EXAM: MRI HEAD WITHOUT CONTRAST TECHNIQUE: Multiplanar, multiecho pulse sequences of the brain and surrounding structures were obtained without intravenous contrast. COMPARISON:  Head CT 10/29/2015 FINDINGS: Brain: There is  no evidence of acute infarct,  mass, midline shift, or extra-axial fluid collection. There are 1 or 2 small foci of susceptibility artifact in the white matter of the anterior right occipital lobe with underlying punctate T1 hypointense foci and no T2 signal abnormality. There is no evidence of intracranial hemorrhage elsewhere. Scattered small foci of T2 hyperintensity in the subcortical and deep cerebral white matter bilaterally are nonspecific but compatible with mild chronic small vessel ischemic disease. No cortical/subcortical signal changes suggestive of posterior reversible encephalopathy syndrome. Vascular: Major intracranial vascular flow voids are preserved. Skull and upper cervical spine: Unremarkable bone marrow signal. Sinuses/Orbits: Unremarkable orbits. Trace right ethmoid and right maxillary sinus mucosal thickening. Moderate left mastoid effusion. Other: None. IMPRESSION: 1. No acute intracranial abnormality. 2. Mild chronic small vessel ischemic disease. 3. 1 or 2 chronic microhemorrhages in the right occipital lobe which may be secondary to chronic hypertension and small vessel disease or possibly reflect a tiny underlying cavernoma. Electronically Signed   By: Logan Bores M.D.   On: 10/31/2015 10:19   US Abdomen Limited Ruq  Result Date: 10/31/2015 CLINICAL DATA:  Pain.  Vomiting. EXAM: US ABDOMEN LIMITED - RIGHT UPPER QUADRANT COMPARISON:  10/29/2015 . FINDINGS: Gallbladder: No gallstones or wall thickening visualized. No sonographic Murphy sign noted by sonographer. Common bile duct: Diameter: 6 mm Liver: Liver is echogenic consistent fatty infiltration and/or hepatocellular disease. IMPRESSION: No acute or focal abnormality. The liver is echogenic consistent with fatty infiltration and/or hepatocellular disease. Electronically Signed   By: Marcello Moores  Register   On: 10/31/2015 10:52    ASSESSMENT AND PLAN:   Active Problems:   Acute renal insufficiency   Elevated troponin   Urinary  tract infection without hematuria   Left flank pain   Malignant essential hypertension   Abdominal pain   Acute renal failure (HCC)   Nausea vomiting and diarrhea   Smoker   Poorly controlled type 2 diabetes mellitus ([place])  #1. Malignant essential hypertension,   metoprolol, hydralazine and amlodipine for blood pressure control,    Blood pressure is under control now.   #2 Elevated troponin,   likely demand ischemia, initiate metoprolol, nitroglycerin, aspirin, followed cardiac enzymes 3. Cardiology has seen patient  # Migraine ON meds  # UTI ON ciprofloxacin Ucx have GNR  # chronic pancreatitis Has chronic pain  All the records are reviewed and case discussed with Care Management/Social Workerr. Management plans discussed with the patient, family and they are in agreement.  CODE STATUS: Full.  TOTAL TIME TAKING CARE OF THIS PATIENT: 30 minutes.   POSSIBLE D/C IN 1-2 DAYS, DEPENDING ON CLINICAL CONDITION.  Hillary Bow R M.D on 10/31/2015   Between 7am to 6pm - Pager - [phone]  After 6pm go to [email or web address] - password [email or web address] [facility]  Office  [phone]  CC: Primary care physician; Marden Noble, MD  Note: This dictation was prepared with Dragon dictation along with smaller phrase technology. Any transcriptional errors that result from this process are unintentional.

## 2015-10-31 NOTE — Progress Notes (Signed)
Inpatient Diabetes Program Recommendations  AACE/ADA: New Consensus Statement on Inpatient Glycemic Control (2015)  Target Ranges:  Prepandial:   less than 140 mg/dL      Peak postprandial:   less than 180 mg/dL (1-2 hours)      Critically ill patients:  140 - 180 mg/dL   Lab Results  Component Value Date   GLUCAP 147 (H) 10/31/2015   HGBA1C 9.7 (H) 10/29/2015    Review of Glycemic Control  Results for Karen [name], Karen [name] (MRN [id number]) as of 10/31/2015 11:13  Ref. Range 10/30/2015 07:19 10/30/2015 11:42 10/30/2015 16:34 10/30/2015 20:51 10/31/2015 07:49  Glucose-Capillary Latest Ref Range: 65 - 99 mg/dL 237 (H) 101 (H) 237 (H) 162 (H) 147 (H)    Diabetes history: Type 2 Outpatient Diabetes medications: Novolog 70/30 15 units bid Current orders for Inpatient glycemic control: Novolog 70/30 15 units bid, Novolog sensitive correction 0-9 units tid, Novolog 0-5 units qhs  Inpatient Diabetes Program Recommendations: Agree with current medications for blood sugar management.      Spoke to patient re: recent A1C of 9.7%.  She tells me she doesn't always take her insulin because it makes her "feel bad".  When I asked her to describe what the feeling is, her friend stated "she throws up"  She indicated that it usually happens after the evening dose of insulin and complains of getting really hot and sweaty then throwing up.   She tells me she sees Dr. Brynda Greathouse 1x/month but has never told him about not taking the insulin because she thinks the symptoms are related to the insulin.  I have strongly encouraged her to discuss this with the MD and have indicated that the symptoms may be related to something other than the insulin, that insulin does not usually cause this type of reaction.  Patient would like Gucerna while inpatient.   Low blood sugar this am prior to lunch - patient was given 70/30 insulin but was NPO.   Gentry Fitz, RN, BA, MHA, CDE Diabetes Coordinator Inpatient Diabetes  Program  [phone] (Team Pager) [phone] ([place]) 10/31/2015 12:06 PM

## 2015-11-01 DIAGNOSIS — G4452 New daily persistent headache (NDPH): Secondary | ICD-10-CM

## 2015-11-01 LAB — GLUCOSE, CAPILLARY
GLUCOSE-CAPILLARY: 74 mg/dL (ref 65–99)
Glucose-Capillary: 118 mg/dL — ABNORMAL HIGH (ref 65–99)

## 2015-11-01 LAB — URINE CULTURE: Culture: >=100000 — AB

## 2015-11-01 MED ORDER — AMITRIPTYLINE HCL 25 MG PO TABS: 25.0000 mg | ORAL_TABLET | Freq: Every day | ORAL | Status: DC

## 2015-11-01 MED ORDER — METOPROLOL TARTRATE 25 MG PO TABS: 25.0000 mg | ORAL_TABLET | Freq: Two times a day (BID) | ORAL | 0 refills | Status: DC

## 2015-11-01 MED ORDER — PANTOPRAZOLE SODIUM 40 MG PO TBEC: 40.0000 mg | DELAYED_RELEASE_TABLET | Freq: Every day | ORAL | 0 refills | Status: DC

## 2015-11-01 MED ORDER — BUTALBITAL-APAP-CAFFEINE 50-325-40 MG PO TABS: 2.0000 | ORAL_TABLET | Freq: Two times a day (BID) | ORAL | 0 refills | Status: DC | PRN

## 2015-11-01 MED ORDER — AMITRIPTYLINE HCL 10 MG PO TABS: 10.0000 mg | ORAL_TABLET | Freq: Every day | ORAL | Status: DC

## 2015-11-01 MED ORDER — AMITRIPTYLINE HCL 25 MG PO TABS: 25.0000 mg | ORAL_TABLET | Freq: Every day | ORAL | 0 refills | Status: DC

## 2015-11-01 MED ORDER — BUTALBITAL-APAP-CAFFEINE 50-325-40 MG PO TABS: 2.0000 | ORAL_TABLET | Freq: Two times a day (BID) | ORAL | Status: DC | PRN

## 2015-11-01 MED ORDER — LISINOPRIL 40 MG PO TABS: 40.0000 mg | ORAL_TABLET | Freq: Every day | ORAL | 0 refills | Status: DC

## 2015-11-01 MED ORDER — METOCLOPRAMIDE HCL 5 MG PO TABS: 5.0000 mg | ORAL_TABLET | Freq: Three times a day (TID) | ORAL | 0 refills | Status: DC

## 2015-11-01 MED ORDER — CIPROFLOXACIN HCL 500 MG PO TABS: 500.0000 mg | ORAL_TABLET | Freq: Two times a day (BID) | ORAL | 0 refills | Status: DC

## 2015-11-01 MED ORDER — AMLODIPINE BESYLATE 5 MG PO TABS: 5.0000 mg | ORAL_TABLET | Freq: Every day | ORAL | 0 refills | Status: DC

## 2015-11-01 NOTE — Progress Notes (Signed)
Discharge instructions explained to pt/ verbalized an understanding/ iv and tele removed/ RX given to pt/ transported off unit via wheelchair.  

## 2015-11-01 NOTE — Discharge Instructions (Signed)
Take medications regularly as prescribed  Follow-up with your primary care physician and Dr. Vira Agar at [place] for your chronic vomiting  Soft bland diet for 2 days and then advance as tolerated

## 2015-11-01 NOTE — Consult Note (Signed)
Reason for Consult:Headache Referring Physician: Sudini  CC: Headache  HPI: Karen [name] is an 60 y.o. female with multiple medical problems who reports that for the past two months she has been having intermittent headaches.  She describes them as being frontal and on the top of her head.  They are throbbing. They last until she takes medication for them.  Percocet seems to work in about 5 minutes and extra strength Tylenol works in about an hour.  Since admission to the hospital her headaches have been more severe and daily.  She reports that they are located in the same place and rates them at a 9/10.  She has been given Morphine and this only takes the headache down to a 8/10.  She has had multiple administrations.  She does not feel anything is helping.  Doe shave associated nausea.   It seems that other than beer the patient drinks a lot of caffeine at home as well.  UDS in May was negative.    Past Medical History:  Diagnosis Date  . Alcoholic pancreatitis   . Depression   . Diabetes mellitus without complication ([place])   . DKA (diabetic ketoacidoses) ([place]) 02/17/2015  . Emphysematous cystitis   . Hypercholesteremia   . Hypokalemia     Past Surgical History:  Procedure Laterality Date  . ABDOMINAL HYSTERECTOMY  1996  . APPENDECTOMY  1997  . HAND SURGERY  1988  . THYROID SURGERY  2013    Family History  Problem Relation Age of Onset  . Family history unknown: Yes    Social History:  reports that she has quit smoking. She has never used smokeless tobacco. She reports that she drinks alcohol. Her drug history is not on file.  Allergies  Allergen Reactions  . Penicillins Anaphylaxis    Has patient had a PCN reaction causing immediate rash, facial/tongue/throat swelling, SOB or lightheadedness with hypotension: Yes Has patient had a PCN reaction causing severe rash involving mucus membranes or skin necrosis: No Has patient had a PCN reaction that required hospitalization  No Has patient had a PCN reaction occurring within the last 10 years: No If all of the above answers are "NO", then may proceed with Cephalosporin use.  Marland Kitchen Percocet [Oxycodone-Acetaminophen] Rash    Medications:  I have reviewed the patient's current medications. Prior to Admission:  Prescriptions Prior to Admission  Medication Sig Dispense Refill Last Dose  . insulin aspart protamine - aspart (NOVOLOG MIX 70/30 FLEXPEN) (70-30) 100 UNIT/ML FlexPen Inject 15 Units into the skin 2 (two) times daily.   10/28/2015 at Unknown time  . oxyCODONE-acetaminophen (PERCOCET) 5-325 MG tablet Take 2 tablets by mouth every 6 (six) hours as needed for moderate pain or severe pain. 12 tablet 0 10/28/2015 at Unknown time   Scheduled: . amitriptyline  25 mg Oral QHS  . amLODipine  5 mg Oral Daily  . aspirin EC  325 mg Oral Daily  . ciprofloxacin  500 mg Oral BID  . enoxaparin (LOVENOX) injection  40 mg Subcutaneous Q24H  . hydrALAZINE  25 mg Oral Q8H  . insulin aspart  0-5 Units Subcutaneous QHS  . insulin aspart  0-9 Units Subcutaneous TID WC  . insulin aspart protamine- aspart  15 Units Subcutaneous BID WC  . metoCLOPramide (REGLAN) injection  5 mg Intravenous Q6H  . metoprolol tartrate  25 mg Oral BID  . nitroGLYCERIN  1 inch Topical Q6H  . pantoprazole  40 mg Oral BID  . sodium chloride flush  3 mL Intravenous Q12H    ROS: History obtained from the patient  General ROS: negative for - chills, fatigue, fever, night sweats, weight gain or weight loss Psychological ROS: negative for - behavioral disorder, hallucinations, memory difficulties, mood swings or suicidal ideation Ophthalmic ROS: negative for - blurry vision, double vision, eye pain or loss of vision ENT ROS: negative for - epistaxis, nasal discharge, oral lesions, sore throat, tinnitus or vertigo Allergy and Immunology ROS: negative for - hives or itchy/watery eyes Hematological and Lymphatic ROS: negative for - bleeding problems,  bruising or swollen lymph nodes Endocrine ROS: negative for - galactorrhea, hair pattern changes, polydipsia/polyuria or temperature intolerance Respiratory ROS: negative for - cough, hemoptysis, shortness of breath or wheezing Cardiovascular ROS: negative for - chest pain, dyspnea on exertion, edema or irregular heartbeat Gastrointestinal ROS: abdominal pain Genito-Urinary ROS: negative for - dysuria, hematuria, incontinence or urinary frequency/urgency Musculoskeletal ROS: negative for - joint swelling or muscular weakness Neurological ROS: as noted in HPI Dermatological ROS: negative for rash and skin lesion changes  Physical Examination: Blood pressure (!) 161/85, pulse 89, temperature 98.6 F (37 C), resp. rate 20, height 5\' 1"  (1.549 m), weight 48.7 kg (107 lb 6 oz), SpO2 98 %.  HEENT-  Normocephalic, no lesions, without obvious abnormality.  Normal external eye and conjunctiva.  Normal TM's bilaterally.  Normal auditory canals and external ears. Normal external nose, mucus membranes and septum.  Normal pharynx. Cardiovascular- S1, S2 normal, pulses palpable throughout   Lungs- chest clear, no wheezing, rales, normal symmetric air entry Abdomen- soft, non-tender; bowel sounds normal; no masses,  no organomegaly Extremities- no edema Lymph-no adenopathy palpable Musculoskeletal-no joint tenderness, deformity or swelling Skin-warm and dry, no hyperpigmentation, vitiligo, or suspicious lesions  Neurological Examination Mental Status: Alert, oriented, thought content appropriate.  Speech fluent without evidence of aphasia.  Able to follow 3 step commands without difficulty. Cranial Nerves: II: Discs flat bilaterally; Visual fields grossly normal, pupils equal, round, reactive to light and accommodation III,IV, VI: ptosis not present, extra-ocular motions intact bilaterally V,VII: smile symmetric, facial light touch sensation normal bilaterally VIII: hearing normal bilaterally IX,X:  gag reflex present XI: bilateral shoulder shrug XII: midline tongue extension Motor: Right : Upper extremity   5/5    Left:     Upper extremity   5/5  Lower extremity   5/5     Lower extremity   5/5 Tone and bulk:normal tone throughout; no atrophy noted Sensory: Pinprick and light touch intact throughout, bilaterally Deep Tendon Reflexes: 2+ and symmetric throughout Plantars: Right: downgoing   Left: downgoing Cerebellar: Normal finger-to-nose and normal heel-to-shin testing bilaterally Gait: not tested due to pain    Laboratory Studies:   Basic Metabolic Panel:  Recent Labs Lab 10/29/15 0902 10/30/15 0421 10/31/15 0437  NA 139 135 139  K 3.8 3.4* 3.4*  CL 96* 96* 104  CO2 25 28 28   GLUCOSE 302* 197* 91  BUN 27* 26* 22*  CREATININE 1.23* 1.23* 1.13*  CALCIUM 9.6 8.7* 8.7*    Liver Function Tests:  Recent Labs Lab 10/29/15 0902 10/31/15 0437  AST 20 21  ALT 26 18  ALKPHOS 143* 89  BILITOT 1.3* 0.8  PROT 8.5* 6.8  ALBUMIN 4.1 3.4*    Recent Labs Lab 10/29/15 0902 10/30/15 0421  LIPASE 58* 126*   No results for input(s): AMMONIA in the last 168 hours.  CBC:  Recent Labs Lab 10/29/15 0902 10/30/15 0421  WBC 9.1 10.4  HGB 13.9 13.3  HCT 39.7 39.7  MCV 90.9 93.4  PLT 203 224    Cardiac Enzymes:  Recent Labs Lab 10/29/15 0902 10/29/15 1552 10/29/15 2139 10/30/15 0421  TROPONINI 0.05* 0.08* 0.07* 0.06*    BNP: Invalid input(s): POCBNP  CBG:  Recent Labs Lab 10/31/15 0749 10/31/15 1141 10/31/15 1633 10/31/15 2114 11/01/15 0727  GLUCAP 147* 68 163* 92 118*    Microbiology: Results for orders placed or performed during the hospital encounter of 10/29/15  Urine culture     Status: Abnormal   Collection Time: 10/29/15  9:37 AM  Result Value Ref Range Status   Specimen Description URINE, RANDOM  Final   Special Requests NONE  Final   Culture >=100,000 COLONIES/mL ESCHERICHIA COLI (A)  Final   Report Status 11/01/2015 FINAL   Final   Organism ID, Bacteria ESCHERICHIA COLI (A)  Final      Susceptibility   Escherichia coli - MIC*    AMPICILLIN 4 SENSITIVE Sensitive     CEFAZOLIN <=4 SENSITIVE Sensitive     CEFTRIAXONE <=1 SENSITIVE Sensitive     CIPROFLOXACIN 1 SENSITIVE Sensitive     GENTAMICIN <=1 SENSITIVE Sensitive     IMIPENEM <=0.25 SENSITIVE Sensitive     NITROFURANTOIN <=16 SENSITIVE Sensitive     TRIMETH/SULFA >=320 RESISTANT Resistant     AMPICILLIN/SULBACTAM <=2 SENSITIVE Sensitive     PIP/TAZO <=4 SENSITIVE Sensitive     Extended ESBL NEGATIVE Sensitive     * >=100,000 COLONIES/mL ESCHERICHIA COLI  Culture, blood (routine x 2)     Status: None (Preliminary result)   Collection Time: 10/29/15 12:28 PM  Result Value Ref Range Status   Specimen Description BLOOD RIGHT AC  Final   Special Requests   Final    BOTTLES DRAWN AEROBIC AND ANAEROBIC AER 5CC ANA 4CC   Culture NO GROWTH 3 DAYS  Final   Report Status PENDING  Incomplete  Culture, blood (routine x 2)     Status: None (Preliminary result)   Collection Time: 10/29/15 12:28 PM  Result Value Ref Range Status   Specimen Description BLOOD  LEFT AC  Final   Special Requests   Final    BOTTLES DRAWN AEROBIC AND ANAEROBIC  AER2CC AER  2CC   Culture NO GROWTH 3 DAYS  Final   Report Status PENDING  Incomplete    Coagulation Studies: No results for input(s): LABPROT, INR in the last 72 hours.  Urinalysis:  Recent Labs Lab 10/29/15 0937  COLORURINE YELLOW*  LABSPEC 1.020  PHURINE 5.0  GLUCOSEU >500*  HGBUR 1+*  BILIRUBINUR NEGATIVE  KETONESUR 1+*  PROTEINUR >500*  NITRITE NEGATIVE  LEUKOCYTESUR TRACE*    Lipid Panel:     Component Value Date/Time   CHOL 360 (H) 02/18/2015 0127   CHOL 395 (H) 09/02/2011 0608   TRIG 164 (H) 10/29/2015 1600   TRIG 1,436 (H) 09/02/2011 0608   HDL 49 02/18/2015 0127   HDL 52 09/02/2011 0608   CHOLHDL 7.3 02/18/2015 0127   VLDL 28 02/18/2015 0127   VLDL SEE COMMENT 09/02/2011 0608   LDLCALC 283  (H) 02/18/2015 0127   LDLCALC SEE COMMENT 09/02/2011 0608    HgbA1C:  Lab Results  Component Value Date   HGBA1C 9.7 (H) 10/29/2015    Urine Drug Screen:     Component Value Date/Time   LABOPIA NONE DETECTED 05/14/2015 1035   COCAINSCRNUR NONE DETECTED 05/14/2015 1035   LABBENZ NONE DETECTED 05/14/2015 1035   AMPHETMU NONE DETECTED 05/14/2015 1035   THCU  NONE DETECTED 05/14/2015 1035   LABBARB NONE DETECTED 05/14/2015 1035    Alcohol Level: No results for input(s): ETH in the last 168 hours.   Imaging: Mr Brain Wo Contrast  Result Date: 10/31/2015 CLINICAL DATA:  Headaches for a few days. Malignant hypertension. History of diabetes. EXAM: MRI HEAD WITHOUT CONTRAST TECHNIQUE: Multiplanar, multiecho pulse sequences of the brain and surrounding structures were obtained without intravenous contrast. COMPARISON:  Head CT 10/29/2015 FINDINGS: Brain: There is no evidence of acute infarct, mass, midline shift, or extra-axial fluid collection. There are 1 or 2 small foci of susceptibility artifact in the white matter of the anterior right occipital lobe with underlying punctate T1 hypointense foci and no T2 signal abnormality. There is no evidence of intracranial hemorrhage elsewhere. Scattered small foci of T2 hyperintensity in the subcortical and deep cerebral white matter bilaterally are nonspecific but compatible with mild chronic small vessel ischemic disease. No cortical/subcortical signal changes suggestive of posterior reversible encephalopathy syndrome. Vascular: Major intracranial vascular flow voids are preserved. Skull and upper cervical spine: Unremarkable bone marrow signal. Sinuses/Orbits: Unremarkable orbits. Trace right ethmoid and right maxillary sinus mucosal thickening. Moderate left mastoid effusion. Other: None. IMPRESSION: 1. No acute intracranial abnormality. 2. Mild chronic small vessel ischemic disease. 3. 1 or 2 chronic microhemorrhages in the right occipital lobe which  may be secondary to chronic hypertension and small vessel disease or possibly reflect a tiny underlying cavernoma. Electronically Signed   By: Logan Bores M.D.   On: 10/31/2015 10:19   US Abdomen Limited Ruq  Result Date: 10/31/2015 CLINICAL DATA:  Pain.  Vomiting. EXAM: US ABDOMEN LIMITED - RIGHT UPPER QUADRANT COMPARISON:  10/29/2015 . FINDINGS: Gallbladder: No gallstones or wall thickening visualized. No sonographic Murphy sign noted by sonographer. Common bile duct: Diameter: 6 mm Liver: Liver is echogenic consistent fatty infiltration and/or hepatocellular disease. IMPRESSION: No acute or focal abnormality. The liver is echogenic consistent with fatty infiltration and/or hepatocellular disease. Electronically Signed   By: Marcello Moores  Register   On: 10/31/2015 10:52     Assessment/Plan: 60 year old female with complaints of headache.  Headaches preceded her admission and have become daily since admission.  After review it appears that her worsening may be secondary to caffeine withdrawal.  Initial presentation may be related to her overall illness.  MRI of the brain performed and reviewed.  No significant abnormalities noted and no etiology for headache seen.  Patient has not been getting benefit from Morphine.    Recommendations: 1.  Continue prn use of Fioricet.  The caffeine may be helpful and may use up to 2 at a time. 2.  Elavil 10 mg qhs for headache prophylaxis.   3.  Patient continue headache follow up as an outpatient with neurology.     Alexis Goodell, MD Neurology [phone] 11/01/2015, 11:38 AM

## 2015-11-01 NOTE — Progress Notes (Signed)
Pt. Has had multiple c/o headache always with a rating of 9/10. Pt. Medicated multiple times for pain. Pt. States nothing helps, she would like a neurologist consult. Pt. Is very pleasant, smiling while carrying on conversation with the staff members.

## 2015-11-03 LAB — CULTURE, BLOOD (ROUTINE X 2)
CULTURE: NO GROWTH
CULTURE: NO GROWTH

## 2015-11-04 NOTE — Discharge Summary (Signed)
[facility] NAME: Karen [name]    MR#:  [id number]  DATE OF BIRTH:  Oct 28, 1955  DATE OF ADMISSION:  10/29/2015 ADMITTING PHYSICIAN: Theodoro Grist, MD  DATE OF DISCHARGE: 11/01/2015  1:15 PM  PRIMARY CARE PHYSICIAN: Marden Noble, MD   ADMISSION DIAGNOSIS:  UTI (urinary tract infection) [N39.0] Left flank pain [R10.9] Troponin level elevated [R74.8] Nausea vomiting and diarrhea [R11.2, R19.7] Headache [R51] Urinary tract infection without hematuria, site unspecified [N39.0] Acute renal failure, unspecified acute renal failure type ([place]) [N17.9]  DISCHARGE DIAGNOSIS:  Active Problems:   Acute renal insufficiency   Elevated troponin   Urinary tract infection without hematuria   Left flank pain   Malignant essential hypertension   Abdominal pain   Acute renal failure ([facility])   Nausea vomiting and diarrhea   Smoker   Poorly controlled type 2 diabetes mellitus ([place])   SECONDARY DIAGNOSIS:   Past Medical History:  Diagnosis Date  . Alcoholic pancreatitis   . Depression   . Diabetes mellitus without complication ([place])   . DKA (diabetic ketoacidoses) ([place]) 02/17/2015  . Emphysematous cystitis   . Hypercholesteremia   . Hypokalemia      ADMITTING HISTORY  HISTORY OF PRESENT ILLNESS: Karen [name]  is a 60 y.o. female with a known history of Diabetes mellitus, DKA, alcoholic pancreatitis, depression, hyperlipidemia, who presents to the hospital with complaints of nausea and vomiting which started yesterday. The patient also felt feverish and chilly, sweaty and weak, complained of lower abdominal pain, also some chest pain whenever she threw up. She admitted also of headaches, which is going on for the past few days. On arrival to the hospital, she was noted to have malignant essential hypertension with systolic blood pressure of 214, mild elevation of troponin. Her urinalysis revealed pyuria, concerning for urinary tract infection,  she was also complaining of left flank pain for about one week. Hospitalist services were contacted for admission.   HOSPITAL COURSE:   #1. Malignant essential hypertension - Resolved  metoprolol, hydralazine and amlodipine for blood pressure control,    Blood pressure is under control now.   #2 Elevated troponin,   likely demand ischemia, initiate metoprolol, nitroglycerin, aspirin, followed cardiac enzymes 3. Cardiology has seen patient. No further recommendations for investigations ordered.  # Migraine vs caffeine withdrawal ON meds. Neurology saw the patient. Started on Elavil and Fioricet. Improved.  # UTI On ciprofloxacin Ucx have GNR Discharged home on 3 more days of antibiotics.  # chronic pancreatitis Has chronic pain  Stable for discharge home to follow-up with primary care physician.  CONSULTS OBTAINED:  Treatment Team:  Minna Merritts, MD Alexis Goodell, MD  DRUG ALLERGIES:   Allergies  Allergen Reactions  . Penicillins Anaphylaxis    Has patient had a PCN reaction causing immediate rash, facial/tongue/throat swelling, SOB or lightheadedness with hypotension: Yes Has patient had a PCN reaction causing severe rash involving mucus membranes or skin necrosis: No Has patient had a PCN reaction that required hospitalization No Has patient had a PCN reaction occurring within the last 10 years: No If all of the above answers are "NO", then may proceed with Cephalosporin use.  Marland Kitchen Percocet [Oxycodone-Acetaminophen] Rash    DISCHARGE MEDICATIONS:   Discharge Medication List as of 11/01/2015 12:56 PM    START taking these medications   Details  amitriptyline (ELAVIL) 25 MG tablet Take 1 tablet (25 mg total) by mouth at bedtime., Starting Fri 11/01/2015, Normal  amLODipine (NORVASC) 5 MG tablet Take 1 tablet (5 mg total) by mouth daily., Starting Sat 11/02/2015, Normal    butalbital-acetaminophen-caffeine (FIORICET, ESGIC) 50-325-40 MG tablet Take 2  tablets by mouth every 12 (twelve) hours as needed for headache., Starting Fri 11/01/2015, Print    ciprofloxacin (CIPRO) 500 MG tablet Take 1 tablet (500 mg total) by mouth 2 (two) times daily., Starting Fri 11/01/2015, Print    lisinopril (PRINIVIL,ZESTRIL) 40 MG tablet Take 1 tablet (40 mg total) by mouth daily., Starting Fri 11/01/2015, Print    metoCLOPramide (REGLAN) 5 MG tablet Take 1 tablet (5 mg total) by mouth 3 (three) times daily before meals., Starting Fri 11/01/2015, Print    metoprolol tartrate (LOPRESSOR) 25 MG tablet Take 1 tablet (25 mg total) by mouth 2 (two) times daily., Starting Fri 11/01/2015, Print    pantoprazole (PROTONIX) 40 MG tablet Take 1 tablet (40 mg total) by mouth daily., Starting Fri 11/01/2015, Print      CONTINUE these medications which have NOT CHANGED   Details  insulin aspart protamine - aspart (NOVOLOG MIX 70/30 FLEXPEN) (70-30) 100 UNIT/ML FlexPen Inject 15 Units into the skin 2 (two) times daily., Until Discontinued, Historical Med    oxyCODONE-acetaminophen (PERCOCET) 5-325 MG tablet Take 2 tablets by mouth every 6 (six) hours as needed for moderate pain or severe pain., Starting Tue 05/14/2015, Print        Today   VITAL SIGNS:  Blood pressure (!) 157/73, pulse 74, temperature 97.8 F (36.6 C), temperature source Oral, resp. rate 14, height 5\' 1"  (1.549 m), weight 48.7 kg (107 lb 6 oz), SpO2 99 %.  I/O:  No intake or output data in the 24 hours ending 11/04/15 1334  PHYSICAL EXAMINATION:  Physical Exam  GENERAL:  60 y.o.-year-old patient lying in the bed with no acute distress.  LUNGS: Normal breath sounds bilaterally, no wheezing, rales,rhonchi or crepitation. No use of accessory muscles of respiration.  CARDIOVASCULAR: S1, S2 normal. No murmurs, rubs, or gallops.  ABDOMEN: Soft, non-tender, non-distended. Bowel sounds present. No organomegaly or mass.  NEUROLOGIC: Moves all 4 extremities. PSYCHIATRIC: The patient is alert and  oriented x 3.  SKIN: No obvious rash, lesion, or ulcer.   DATA REVIEW:   CBC  Recent Labs Lab 10/30/15 0421  WBC 10.4  HGB 13.3  HCT 39.7  PLT 224    Chemistries   Recent Labs Lab 10/31/15 0437  NA 139  K 3.4*  CL 104  CO2 28  GLUCOSE 91  BUN 22*  CREATININE 1.13*  CALCIUM 8.7*  AST 21  ALT 18  ALKPHOS 89  BILITOT 0.8    Cardiac Enzymes  Recent Labs Lab 10/30/15 0421  TROPONINI 0.06*    Microbiology Results  Results for orders placed or performed during the hospital encounter of 10/29/15  Urine culture     Status: Abnormal   Collection Time: 10/29/15  9:37 AM  Result Value Ref Range Status   Specimen Description URINE, RANDOM  Final   Special Requests NONE  Final   Culture >=100,000 COLONIES/mL ESCHERICHIA COLI (A)  Final   Report Status 11/01/2015 FINAL  Final   Organism ID, Bacteria ESCHERICHIA COLI (A)  Final      Susceptibility   Escherichia coli - MIC*    AMPICILLIN 4 SENSITIVE Sensitive     CEFAZOLIN <=4 SENSITIVE Sensitive     CEFTRIAXONE <=1 SENSITIVE Sensitive     CIPROFLOXACIN 1 SENSITIVE Sensitive     GENTAMICIN <=1 SENSITIVE Sensitive  IMIPENEM <=0.25 SENSITIVE Sensitive     NITROFURANTOIN <=16 SENSITIVE Sensitive     TRIMETH/SULFA >=320 RESISTANT Resistant     AMPICILLIN/SULBACTAM <=2 SENSITIVE Sensitive     PIP/TAZO <=4 SENSITIVE Sensitive     Extended ESBL NEGATIVE Sensitive     * >=100,000 COLONIES/mL ESCHERICHIA COLI  Culture, blood (routine x 2)     Status: None   Collection Time: 10/29/15 12:28 PM  Result Value Ref Range Status   Specimen Description BLOOD RIGHT AC  Final   Special Requests   Final    BOTTLES DRAWN AEROBIC AND ANAEROBIC AER 5CC ANA 4CC   Culture NO GROWTH 5 DAYS  Final   Report Status 11/03/2015 FINAL  Final  Culture, blood (routine x 2)     Status: None   Collection Time: 10/29/15 12:28 PM  Result Value Ref Range Status   Specimen Description BLOOD  LEFT AC  Final   Special Requests   Final     BOTTLES DRAWN AEROBIC AND ANAEROBIC  [place] AER  [place]   Culture NO GROWTH 5 DAYS  Final   Report Status 11/03/2015 FINAL  Final    RADIOLOGY:  No results found.  Follow up with PCP in 1 week.  Management plans discussed with the patient, family and they are in agreement.  CODE STATUS:  Code Status History    Date Active Date Inactive Code Status Order ID Comments User Context   10/29/2015  3:45 PM 11/01/2015  4:23 PM Full Code [id number]  Theodoro Grist, MD Inpatient   02/17/2015  5:04 PM 02/19/2015  6:04 PM Full Code [id number]  Nicholes Mango, MD Inpatient      TOTAL TIME TAKING CARE OF THIS PATIENT ON DAY OF DISCHARGE: more than 30 minutes.   Hillary Bow R M.D on 11/04/2015 at 1:34 PM  Between 7am to 6pm - Pager - [phone]  After 6pm go to [email or web address] - password [email or web address] [facility]  Office  [phone]  CC: Primary care physician; Marden Noble, MD  Note: This dictation was prepared with Dragon dictation along with smaller phrase technology. Any transcriptional errors that result from this process are unintentional.

## 2015-12-10 ENCOUNTER — Emergency Department: Payer: Medicaid Other

## 2015-12-10 ENCOUNTER — Inpatient Hospital Stay: Payer: Medicaid Other

## 2015-12-10 ENCOUNTER — Encounter: Payer: Self-pay | Admitting: Emergency Medicine

## 2015-12-10 ENCOUNTER — Inpatient Hospital Stay
Admission: EM | Admit: 2015-12-10 | Discharge: 2015-12-12 | DRG: 637 | Disposition: A | Discharge status: Home / Self Care | Payer: Medicaid Other | Attending: Internal Medicine | Admitting: Internal Medicine

## 2015-12-10 DIAGNOSIS — Z90710 Acquired absence of both cervix and uterus: Secondary | ICD-10-CM

## 2015-12-10 DIAGNOSIS — R112 Nausea with vomiting, unspecified: Secondary | ICD-10-CM | POA: Diagnosis present

## 2015-12-10 DIAGNOSIS — R748 Abnormal levels of other serum enzymes: Secondary | ICD-10-CM | POA: Diagnosis present

## 2015-12-10 DIAGNOSIS — Z79899 Other long term (current) drug therapy: Secondary | ICD-10-CM | POA: Diagnosis not present

## 2015-12-10 DIAGNOSIS — J9601 Acute respiratory failure with hypoxia: Secondary | ICD-10-CM

## 2015-12-10 DIAGNOSIS — I10 Essential (primary) hypertension: Secondary | ICD-10-CM | POA: Diagnosis present

## 2015-12-10 DIAGNOSIS — E1110 Type 2 diabetes mellitus with ketoacidosis without coma: Principal | ICD-10-CM

## 2015-12-10 DIAGNOSIS — Z885 Allergy status to narcotic agent status: Secondary | ICD-10-CM | POA: Diagnosis not present

## 2015-12-10 DIAGNOSIS — E1310 Other specified diabetes mellitus with ketoacidosis without coma: Secondary | ICD-10-CM | POA: Diagnosis not present

## 2015-12-10 DIAGNOSIS — N898 Other specified noninflammatory disorders of vagina: Secondary | ICD-10-CM | POA: Diagnosis present

## 2015-12-10 DIAGNOSIS — Z794 Long term (current) use of insulin: Secondary | ICD-10-CM

## 2015-12-10 DIAGNOSIS — F329 Major depressive disorder, single episode, unspecified: Secondary | ICD-10-CM | POA: Diagnosis present

## 2015-12-10 DIAGNOSIS — N179 Acute kidney failure, unspecified: Secondary | ICD-10-CM | POA: Diagnosis present

## 2015-12-10 DIAGNOSIS — R109 Unspecified abdominal pain: Secondary | ICD-10-CM

## 2015-12-10 DIAGNOSIS — K859 Acute pancreatitis without necrosis or infection, unspecified: Secondary | ICD-10-CM

## 2015-12-10 DIAGNOSIS — Z880 Allergy status to penicillin: Secondary | ICD-10-CM | POA: Diagnosis not present

## 2015-12-10 DIAGNOSIS — Z87440 Personal history of urinary (tract) infections: Secondary | ICD-10-CM | POA: Diagnosis not present

## 2015-12-10 DIAGNOSIS — E860 Dehydration: Secondary | ICD-10-CM | POA: Diagnosis present

## 2015-12-10 DIAGNOSIS — I959 Hypotension, unspecified: Secondary | ICD-10-CM

## 2015-12-10 DIAGNOSIS — D72829 Elevated white blood cell count, unspecified: Secondary | ICD-10-CM | POA: Diagnosis present

## 2015-12-10 DIAGNOSIS — T450X5A Adverse effect of antiallergic and antiemetic drugs, initial encounter: Secondary | ICD-10-CM | POA: Diagnosis not present

## 2015-12-10 DIAGNOSIS — E7800 Pure hypercholesterolemia, unspecified: Secondary | ICD-10-CM | POA: Diagnosis present

## 2015-12-10 DIAGNOSIS — Z87891 Personal history of nicotine dependence: Secondary | ICD-10-CM | POA: Diagnosis not present

## 2015-12-10 LAB — CBC
HEMATOCRIT: 42.6 % (ref 35.0–47.0)
HEMOGLOBIN: 14.7 g/dL (ref 12.0–16.0)
MCH: 31.4 pg (ref 26.0–34.0)
MCHC: 34.5 g/dL (ref 32.0–36.0)
MCV: 91.1 fL (ref 80.0–100.0)
Platelets: 182 10*3/uL (ref 150–440)
RBC: 4.68 MIL/uL (ref 3.80–5.20)
RDW: 12.1 % (ref 11.5–14.5)
WBC: 12.3 10*3/uL — ABNORMAL HIGH (ref 3.6–11.0)

## 2015-12-10 LAB — GLUCOSE, CAPILLARY
GLUCOSE-CAPILLARY: 438 mg/dL — AB (ref 65–99)
GLUCOSE-CAPILLARY: 323 mg/dL — AB (ref 65–99)
GLUCOSE-CAPILLARY: 265 mg/dL — AB (ref 65–99)
GLUCOSE-CAPILLARY: 200 mg/dL — AB (ref 65–99)
GLUCOSE-CAPILLARY: 131 mg/dL — AB (ref 65–99)
Glucose-Capillary: 368 mg/dL — ABNORMAL HIGH (ref 65–99)
Glucose-Capillary: 233 mg/dL — ABNORMAL HIGH (ref 65–99)
Glucose-Capillary: 150 mg/dL — ABNORMAL HIGH (ref 65–99)
Glucose-Capillary: 97 mg/dL (ref 65–99)
Glucose-Capillary: 175 mg/dL — ABNORMAL HIGH (ref 65–99)

## 2015-12-10 LAB — BASIC METABOLIC PANEL
ANION GAP: 10 (ref 5–15)
ANION GAP: 8 (ref 5–15)
BUN: 33 mg/dL — ABNORMAL HIGH (ref 6–20)
BUN: 28 mg/dL — ABNORMAL HIGH (ref 6–20)
CALCIUM: 7.8 mg/dL — AB (ref 8.9–10.3)
CALCIUM: 7.7 mg/dL — AB (ref 8.9–10.3)
CO2: 22 mmol/L (ref 22–32)
CO2: 23 mmol/L (ref 22–32)
Chloride: 109 mmol/L (ref 101–111)
Chloride: 110 mmol/L (ref 101–111)
Creatinine, Ser: 1.11 mg/dL — ABNORMAL HIGH (ref 0.44–1.00)
Creatinine, Ser: 1.14 mg/dL — ABNORMAL HIGH (ref 0.44–1.00)
GFR, EST NON AFRICAN AMERICAN: 54 mL/min — AB (ref >60)
GFR, EST NON AFRICAN AMERICAN: 52 mL/min — AB (ref >60)
Glucose, Bld: 283 mg/dL — ABNORMAL HIGH (ref 65–99)
Glucose, Bld: 141 mg/dL — ABNORMAL HIGH (ref 65–99)
POTASSIUM: 3.4 mmol/L — AB (ref 3.5–5.1)
Potassium: 2.9 mmol/L — ABNORMAL LOW (ref 3.5–5.1)
Sodium: 141 mmol/L (ref 135–145)
Sodium: 141 mmol/L (ref 135–145)

## 2015-12-10 LAB — URINALYSIS, COMPLETE (UACMP) WITH MICROSCOPIC
Glucose, UA: 500 mg/dL — AB
Nitrite: NEGATIVE
PH: 5.5 (ref 5.0–8.0)
Protein, ur: >300 mg/dL — AB
Specific Gravity, Urine: 1.02 (ref 1.005–1.030)

## 2015-12-10 LAB — COMPREHENSIVE METABOLIC PANEL
ALBUMIN: 4.6 g/dL (ref 3.5–5.0)
ALT: 29 U/L (ref 14–54)
ANION GAP: 26 — AB (ref 5–15)
AST: 29 U/L (ref 15–41)
Alkaline Phosphatase: 175 U/L — ABNORMAL HIGH (ref 38–126)
BUN: 34 mg/dL — ABNORMAL HIGH (ref 6–20)
CO2: 21 mmol/L — AB (ref 22–32)
Calcium: 9.7 mg/dL (ref 8.9–10.3)
Chloride: 91 mmol/L — ABNORMAL LOW (ref 101–111)
Creatinine, Ser: 1.51 mg/dL — ABNORMAL HIGH (ref 0.44–1.00)
GFR calc Af Amer: 43 mL/min — ABNORMAL LOW (ref >60)
GFR calc non Af Amer: 37 mL/min — ABNORMAL LOW (ref >60)
GLUCOSE: 502 mg/dL — AB (ref 65–99)
POTASSIUM: 3.9 mmol/L (ref 3.5–5.1)
SODIUM: 138 mmol/L (ref 135–145)
TOTAL PROTEIN: 9.5 g/dL — AB (ref 6.5–8.1)
Total Bilirubin: 1.7 mg/dL — ABNORMAL HIGH (ref 0.3–1.2)

## 2015-12-10 LAB — BLOOD GAS, VENOUS
Acid-base deficit: 7 mmol/L — ABNORMAL HIGH (ref 0.0–2.0)
BICARBONATE: 18.6 mmol/L — AB (ref 20.0–28.0)
O2 Saturation: 87.9 %
PH VEN: 7.31 (ref 7.250–7.430)
Patient temperature: 37
pCO2, Ven: 37 mmHg — ABNORMAL LOW (ref 44.0–60.0)
pO2, Ven: 60 mmHg — ABNORMAL HIGH (ref 32.0–45.0)

## 2015-12-10 LAB — BLOOD GAS, ARTERIAL
Acid-base deficit: 1.9 mmol/L (ref 0.0–2.0)
BICARBONATE: 22.1 mmol/L (ref 20.0–28.0)
FIO2: 1
O2 Saturation: 100 %
PATIENT TEMPERATURE: 37
PH ART: 7.42 (ref 7.350–7.450)
pCO2 arterial: 34 mmHg (ref 32.0–48.0)
pO2, Arterial: 384 mmHg — ABNORMAL HIGH (ref 83.0–108.0)

## 2015-12-10 LAB — LACTIC ACID, PLASMA: LACTIC ACID, VENOUS: 1.1 mmol/L (ref 0.5–1.9)

## 2015-12-10 LAB — PHOSPHORUS: Phosphorus: 3.6 mg/dL (ref 2.5–4.6)

## 2015-12-10 LAB — LIPASE, BLOOD: Lipase: 77 U/L — ABNORMAL HIGH (ref 11–51)

## 2015-12-10 LAB — PROCALCITONIN: PROCALCITONIN: 0.1 ng/mL

## 2015-12-10 LAB — ETHANOL

## 2015-12-10 LAB — MAGNESIUM: Magnesium: 2 mg/dL (ref 1.7–2.4)

## 2015-12-10 LAB — TROPONIN I: Troponin I: <0.03 ng/mL (ref <0.03)

## 2015-12-10 MED ORDER — DEXTROSE-NACL 5-0.45 % IV SOLN
INTRAVENOUS | Status: DC
  Administered 2015-12-10: 13:00:00 via INTRAVENOUS

## 2015-12-10 MED ORDER — HYDRALAZINE HCL 20 MG/ML IJ SOLN: 10.0000 mg | Freq: Four times a day (QID) | INTRAMUSCULAR | Status: DC | PRN

## 2015-12-10 MED ORDER — SODIUM CHLORIDE 0.9 % IV SOLN
INTRAVENOUS | Status: AC
  Administered 2015-12-10: 16:00:00 via INTRAVENOUS

## 2015-12-10 MED ORDER — SODIUM CHLORIDE 0.9 % IV BOLUS (SEPSIS): 1000.0000 mL | Freq: Once | INTRAVENOUS | Status: AC

## 2015-12-10 MED ORDER — SODIUM CHLORIDE 0.9 % IV BOLUS (SEPSIS)
1000.0000 mL | Freq: Once | INTRAVENOUS | Status: AC
  Administered 2015-12-10: 1000 mL via INTRAVENOUS

## 2015-12-10 MED ORDER — PANTOPRAZOLE SODIUM 40 MG PO TBEC
40.0000 mg | DELAYED_RELEASE_TABLET | Freq: Every day | ORAL | Status: DC
  Administered 2015-12-10 – 2015-12-12 (×3): 40 mg via ORAL
  Filled 2015-12-10 (×3): qty 1

## 2015-12-10 MED ORDER — OXYCODONE-ACETAMINOPHEN 5-325 MG PO TABS
2.0000 | ORAL_TABLET | Freq: Four times a day (QID) | ORAL | Status: DC | PRN
  Administered 2015-12-10: 1 via ORAL
  Administered 2015-12-11 – 2015-12-12 (×5): 2 via ORAL
  Filled 2015-12-10 (×6): qty 2
  Filled 2015-12-10: qty 1

## 2015-12-10 MED ORDER — SODIUM CHLORIDE 0.9 % IV SOLN
30.0000 meq | INTRAVENOUS | Status: DC
  Filled 2015-12-10: qty 15

## 2015-12-10 MED ORDER — AMITRIPTYLINE HCL 25 MG PO TABS
25.0000 mg | ORAL_TABLET | Freq: Every day | ORAL | Status: DC
  Administered 2015-12-10 – 2015-12-11 (×2): 25 mg via ORAL
  Filled 2015-12-10 (×2): qty 1

## 2015-12-10 MED ORDER — SODIUM CHLORIDE 0.9 % IV SOLN
30.0000 meq | Freq: Once | INTRAVENOUS | Status: DC
  Filled 2015-12-10: qty 15

## 2015-12-10 MED ORDER — ONDANSETRON HCL 4 MG/2ML IJ SOLN
4.0000 mg | Freq: Four times a day (QID) | INTRAMUSCULAR | Status: DC | PRN
Start: 2015-12-10 — End: 2015-12-12
  Administered 2015-12-10 – 2015-12-11 (×3): 4 mg via INTRAVENOUS
  Filled 2015-12-10 (×3): qty 2

## 2015-12-10 MED ORDER — SODIUM CHLORIDE 0.9 % IV SOLN
INTRAVENOUS | Status: DC
  Administered 2015-12-10: 17:00:00 via INTRAVENOUS

## 2015-12-10 MED ORDER — SODIUM CHLORIDE 0.9 % IV SOLN
INTRAVENOUS | Status: AC
  Administered 2015-12-10: 10:00:00 via INTRAVENOUS

## 2015-12-10 MED ORDER — INSULIN ASPART 100 UNIT/ML ~~LOC~~ SOLN: 0.0000 [IU] | Freq: Every day | SUBCUTANEOUS | Status: DC

## 2015-12-10 MED ORDER — METOCLOPRAMIDE HCL 5 MG/ML IJ SOLN
10.0000 mg | Freq: Once | INTRAMUSCULAR | Status: AC
  Administered 2015-12-10: 10 mg via INTRAVENOUS
  Filled 2015-12-10: qty 2

## 2015-12-10 MED ORDER — SODIUM CHLORIDE 0.9 % IV SOLN
INTRAVENOUS | Status: DC
  Administered 2015-12-10: 12:00:00 via INTRAVENOUS

## 2015-12-10 MED ORDER — MORPHINE SULFATE (PF) 4 MG/ML IV SOLN
2.0000 mg | INTRAVENOUS | Status: DC | PRN
  Administered 2015-12-10 (×2): 2 mg via INTRAVENOUS
  Filled 2015-12-10 (×2): qty 1

## 2015-12-10 MED ORDER — INSULIN ASPART 100 UNIT/ML ~~LOC~~ SOLN
0.0000 [IU] | Freq: Three times a day (TID) | SUBCUTANEOUS | Status: DC
  Administered 2015-12-11 – 2015-12-12 (×3): 1 [IU] via SUBCUTANEOUS
  Filled 2015-12-10 (×3): qty 1

## 2015-12-10 MED ORDER — HEPARIN SODIUM (PORCINE) 5000 UNIT/ML IJ SOLN
5000.0000 [IU] | Freq: Three times a day (TID) | INTRAMUSCULAR | Status: DC
  Administered 2015-12-10 – 2015-12-12 (×6): 5000 [IU] via SUBCUTANEOUS
  Filled 2015-12-10 (×6): qty 1

## 2015-12-10 MED ORDER — METOPROLOL TARTRATE 25 MG PO TABS
25.0000 mg | ORAL_TABLET | Freq: Two times a day (BID) | ORAL | Status: DC
  Administered 2015-12-10: 25 mg via ORAL
  Filled 2015-12-10: qty 1

## 2015-12-10 MED ORDER — AMLODIPINE BESYLATE 5 MG PO TABS
5.0000 mg | ORAL_TABLET | Freq: Every day | ORAL | Status: DC
  Administered 2015-12-10: 5 mg via ORAL
  Filled 2015-12-10: qty 1

## 2015-12-10 MED ORDER — SODIUM CHLORIDE 0.9 % IV SOLN
INTRAVENOUS | Status: DC
  Administered 2015-12-10: 3.1 [IU]/h via INTRAVENOUS
  Filled 2015-12-10: qty 2.5

## 2015-12-10 MED ORDER — SODIUM CHLORIDE 0.9 % IV SOLN
30.0000 meq | Freq: Once | INTRAVENOUS | Status: AC
  Administered 2015-12-10: 30 meq via INTRAVENOUS
  Filled 2015-12-10: qty 15

## 2015-12-10 MED ORDER — INSULIN ASPART PROT & ASPART (70-30 MIX) 100 UNIT/ML ~~LOC~~ SUSP
15.0000 [IU] | Freq: Two times a day (BID) | SUBCUTANEOUS | Status: DC
  Administered 2015-12-10 – 2015-12-12 (×4): 15 [IU] via SUBCUTANEOUS
  Filled 2015-12-10 (×4): qty 15

## 2015-12-10 MED ORDER — ONDANSETRON HCL 4 MG/2ML IJ SOLN
4.0000 mg | Freq: Once | INTRAMUSCULAR | Status: AC
  Administered 2015-12-10: 4 mg via INTRAVENOUS
  Filled 2015-12-10: qty 2

## 2015-12-10 MED ORDER — POTASSIUM CHLORIDE CRYS ER 20 MEQ PO TBCR
20.0000 meq | EXTENDED_RELEASE_TABLET | Freq: Every day | ORAL | Status: DC
  Administered 2015-12-10 – 2015-12-12 (×3): 20 meq via ORAL
  Filled 2015-12-10 (×3): qty 1

## 2015-12-10 NOTE — ED Notes (Signed)
Lab reports glucose 502; MD notifed

## 2015-12-10 NOTE — Progress Notes (Signed)
Chaplain rounded the unit to provide a compassionate presence and support to the patient. Patient indicated that she felt better today than yesterday. Minerva Fester [phone]

## 2015-12-10 NOTE — ED Triage Notes (Addendum)
Pt to room 2 via EMS, from home; reports frontal HA, mid abd and mid chest pain, and vomiting since last night; pt report hx of same; st taking diabetic meds as rx; A&Ox3, PERRL, MAEW, resp even/unlab, lungs clear, apical audible & regular, +BS, abd soft/nondist, tender x 4quad, +PP, -edema;

## 2015-12-10 NOTE — ED Notes (Signed)
Pt to u/s via stretcher accomp by u/s tech 

## 2015-12-10 NOTE — Progress Notes (Signed)
BMP called to Dr. Bridgett Larsson, per MD place diabetes coordinator's recommendations for insulin, transition off insulin gtt and transfer to any med surg once insulin gtt d/c'd. Orders placed. Wilnette Kales

## 2015-12-10 NOTE — Progress Notes (Signed)
eLink Physician-Brief Progress Note Patient Name: JAKILA WENZELL DOB: [birth year] MRN: [id number]   Date of Service  12/10/2015  HPI/Events of Note  Hypotension, acute hypoxemic respiratory failure and AMS.  eICU Interventions  ABG, CXR for ?aspiration and will call critical care to see.     Intervention Category Major Interventions: Other:  YACOUB,WESAM 12/10/2015, 3:58 PM

## 2015-12-10 NOTE — H&P (Signed)
[facility] NAME: Karen [name]    MR#:  [id number]  DATE OF BIRTH:  [birth year]  DATE OF ADMISSION:  12/10/2015  PRIMARY CARE PHYSICIAN: Marden Noble, MD   REQUESTING/REFERRING PHYSICIAN: Loney Hering, MD  CHIEF COMPLAINT:   Chief Complaint  Patient presents with  . Emesis  . Headache  . Abdominal Pain   Nausea vomiting and abdominal pain for 2 days. HISTORY OF PRESENT ILLNESS:  Karen [name]  is a 60 y.o. female with a known history of Hypertension, diabetes, DKA, alcohol intake pancreatitis and UTI. The patient presents to the ED with the nausea, vomiting and abdominal pain for 2 days. She also complains of headache, urea and urine frequency. She denies any fever or chills, no diarrhea, melena or bloody stool. AG is 26, PH 7.31. She is started with insulin drip  PAST MEDICAL HISTORY:   Past Medical History:  Diagnosis Date  . Alcoholic pancreatitis   . Depression   . Diabetes mellitus without complication ([place])   . DKA (diabetic ketoacidoses) ([place]) 02/17/2015  . Emphysematous cystitis   . Hypercholesteremia   . Hypokalemia     PAST SURGICAL HISTORY:   Past Surgical History:  Procedure Laterality Date  . ABDOMINAL HYSTERECTOMY  1996  . APPENDECTOMY  1997  . HAND SURGERY  1988  . THYROID SURGERY  2013    SOCIAL HISTORY:   Social History  Substance Use Topics  . Smoking status: Former Research scientist (life sciences)  . Smokeless tobacco: Never Used  . Alcohol use Yes     Comment: daily    FAMILY HISTORY:   Family History  Problem Relation Age of Onset  . Family history unknown: Yes  The patient doesn't know her family history. She came from [place].  DRUG ALLERGIES:   Allergies  Allergen Reactions  . Penicillins Anaphylaxis    Has patient had a PCN reaction causing immediate rash, facial/tongue/throat swelling, SOB or lightheadedness with hypotension: Yes Has patient had a PCN reaction causing severe rash involving  mucus membranes or skin necrosis: No Has patient had a PCN reaction that required hospitalization No Has patient had a PCN reaction occurring within the last 10 years: No If all of the above answers are "NO", then may proceed with Cephalosporin use.  Marland Kitchen Percocet [Oxycodone-Acetaminophen] Rash    REVIEW OF SYSTEMS:   Review of Systems  Constitutional: Positive for malaise/fatigue. Negative for chills and fever.  HENT: Negative for congestion and sore throat.   Eyes: Positive for blurred vision. Negative for double vision.  Respiratory: Negative for cough, shortness of breath and stridor.   Cardiovascular: Negative for chest pain, palpitations and leg swelling.  Gastrointestinal: Positive for abdominal pain, nausea and vomiting. Negative for blood in stool, diarrhea and melena.  Genitourinary: Positive for dysuria, flank pain and frequency. Negative for hematuria.  Musculoskeletal: Negative for back pain and joint pain.  Skin: Negative for itching and rash.  Neurological: Positive for weakness. Negative for dizziness, focal weakness and loss of consciousness.  Psychiatric/Behavioral: Negative for depression. The patient is not nervous/anxious.     MEDICATIONS AT HOME:   Prior to Admission medications   Medication Sig Start Date End Date Taking? Authorizing Provider  amitriptyline (ELAVIL) 25 MG tablet Take 1 tablet (25 mg total) by mouth at bedtime. 11/01/15  Yes Srikar Sudini, MD  amLODipine (NORVASC) 5 MG tablet Take 1 tablet (5 mg total) by mouth daily. 11/02/15  Yes Hillary Bow, MD  butalbital-acetaminophen-caffeine (FIORICET, ESGIC) 50-325-40 MG tablet Take 2 tablets by mouth every 12 (twelve) hours as needed for headache. 11/01/15  Yes Srikar Sudini, MD  insulin aspart protamine - aspart (NOVOLOG MIX 70/30 FLEXPEN) (70-30) 100 UNIT/ML FlexPen Inject 15 Units into the skin 2 (two) times daily.   Yes Historical Provider, MD  lisinopril (PRINIVIL,ZESTRIL) 40 MG tablet Take 1 tablet  (40 mg total) by mouth daily. 11/01/15  Yes Srikar Sudini, MD  metoCLOPramide (REGLAN) 5 MG tablet Take 1 tablet (5 mg total) by mouth 3 (three) times daily before meals. 11/01/15  Yes Srikar Sudini, MD  metoprolol tartrate (LOPRESSOR) 25 MG tablet Take 1 tablet (25 mg total) by mouth 2 (two) times daily. 11/01/15  Yes Srikar Sudini, MD  oxyCODONE-acetaminophen (PERCOCET) 5-325 MG tablet Take 2 tablets by mouth every 6 (six) hours as needed for moderate pain or severe pain. 05/14/15  Yes Earleen Newport, MD  pantoprazole (PROTONIX) 40 MG tablet Take 1 tablet (40 mg total) by mouth daily. 11/01/15  Yes Srikar Sudini, MD  ciprofloxacin (CIPRO) 500 MG tablet Take 1 tablet (500 mg total) by mouth 2 (two) times daily. Patient not taking: Reported on 12/10/2015 11/01/15   Hillary Bow, MD      VITAL SIGNS:  Blood pressure (!) 156/68, pulse (!) 102, temperature 98.1 F (36.7 C), temperature source Oral, resp. rate 19, height 5\' 1"  (1.549 m), weight 114 lb (51.7 kg), SpO2 98 %.  PHYSICAL EXAMINATION:  Physical Exam  GENERAL:  60 y.o.-year-old patient lying in the bed with no acute distress.  EYES: Pupils equal, round, reactive to light and accommodation. No scleral icterus. Extraocular muscles intact.  HEENT: Head atraumatic, normocephalic. Oropharynx and nasopharynx clear.  NECK:  Supple, no jugular venous distention. No thyroid enlargement, no tenderness.  LUNGS: Normal breath sounds bilaterally, no wheezing, rales,rhonchi or crepitation. No use of accessory muscles of respiration.  CARDIOVASCULAR: S1, S2 normal. No murmurs, rubs, or gallops.  ABDOMEN: Soft, nontender, nondistended. Bowel sounds present. No organomegaly or mass.  EXTREMITIES: No pedal edema, cyanosis, or clubbing.  NEUROLOGIC: Cranial nerves II through XII are intact. Muscle strength 5/5 in all extremities. Sensation intact. Gait not checked.  PSYCHIATRIC: The patient is alert and oriented x 3.  SKIN: No obvious rash, lesion,  or ulcer.   LABORATORY PANEL:   CBC  Recent Labs Lab 12/10/15 0456  WBC 12.3*  HGB 14.7  HCT 42.6  PLT 182   ------------------------------------------------------------------------------------------------------------------  Chemistries   Recent Labs Lab 12/10/15 0456  NA 138  K 3.9  CL 91*  CO2 21*  GLUCOSE 502*  BUN 34*  CREATININE 1.51*  CALCIUM 9.7  AST 29  ALT 29  ALKPHOS 175*  BILITOT 1.7*   ------------------------------------------------------------------------------------------------------------------  Cardiac Enzymes  Recent Labs Lab 12/10/15 0456  TROPONINI <0.03   ------------------------------------------------------------------------------------------------------------------  RADIOLOGY:  US Abdomen Limited Ruq  Result Date: 12/10/2015 CLINICAL DATA:  Diffuse abdominal pain and vomiting for 1 day. EXAM: US ABDOMEN LIMITED - RIGHT UPPER QUADRANT COMPARISON:  10/31/2015 FINDINGS: Gallbladder: No gallstones or wall thickening visualized. No sonographic Murphy sign noted by sonographer. Common bile duct: Diameter: 4 mm, within normal limits. Liver: Mildly increased echogenicity of the hepatic parenchyma, consistent with hepatic steatosis. No focal mass lesion identified. IMPRESSION: No evidence of gallstones or biliary dilatation. Mild hepatic steatosis. Electronically Signed   By: Earle Gell M.D.   On: 12/10/2015 07:29      IMPRESSION AND PLAN:   DKA with diabetes 2 The patient will  be admitted to stepdown unit, start insulin drip, DKA protocol, IV fluid support, Zofran when necessary, follow up BMP every 4 hours.  Accelerated hypertension. Hold lisinopril due to acute renal failure, continue Lopressor, IV hydralazine when necessary.  Acute renal failure due to dehydration, IV fluid support and follow-up BMP.  Elevated lipase and bilirubin. Possible due to nausea and vomiting.  Leukocytosis, possible due to reaction, follow-up CBC and check  urinalysis.   All the records are reviewed and case discussed with ED provider. Management plans discussed with the patient, family and they are in agreement.  CODE STATUS: Full code  TOTAL CRITICAL TIME TAKING CARE OF THIS PATIENT: 55 minutes.    Demetrios Loll M.D on 12/10/2015 at 9:29 AM  Between 7am to 6pm - Pager - [phone]  After 6pm go to [email or web address] - Proofreader  [facility]  Office  [phone]  CC: Primary care physician; Marden Noble, MD   Note: This dictation was prepared with Dragon dictation along with smaller phrase technology. Any transcriptional errors that result from this process are unintentional.

## 2015-12-10 NOTE — ED Notes (Signed)
Pt returns to room; st relief of nausea; 2nd IVF hung to infuse as ordered

## 2015-12-10 NOTE — Progress Notes (Addendum)
At approximately 1530 patient c/o sweating and was unable to sit straight up in bed with out falling over. CBG 131, BP checked and quickly dropped to SBP 60's (see trend in CHL) and oxygen saturations dropped to mid 80's. Patient placed on nonrebreather, IVF bolus started, EKG ordered, labs drawn, and Dr. Bridgett Larsson notified. CXR ordered by Elink. Patient also turned very red during this event, very prominent in extremities.  Received verbal order from Dr. Bridgett Larsson to consult intensivist. BP recovered from 1L bolus. CBG still stable at 97. Insulin gtt stopped per protocol as well as D5 1/2 NS. Patient is currently on 2L [place] resting comfortably with no distress, will continue to assess. Wilnette Kales

## 2015-12-10 NOTE — ED Provider Notes (Signed)
[facility] Emergency Department Provider Note   ____________________________________________   First MD Initiated Contact with Patient 12/10/15 0502     (approximate)  I have reviewed the triage vital signs and the nursing notes.   HISTORY  Chief Complaint Emesis; Headache; and Abdominal Pain    HPI Karen [name] is a 60 y.o. female who comes into the hospital today with some vomiting, headache, chest pain, shortness of breath. The patient reports that the symptoms started yesterday evening. She reports that she did vomit a little bit of blood. She felt feverish but did not take her temperature. The patient's mouth has been dry and she has vomited every 20 minutes. The patient is unable to tolerate medicine. The patient denies any diarrhea but does have some pain all over her abdomen. The patient rates her pain a 10 out of 10 in intensity. The patient reports that she had similar symptoms a month ago and was diagnosed vomiting. She reports that she was put into the hospital. The patient has had some mild blurred vision but denies pain with urination. She feels as though she has to burp and has some indigestion as well. The patient is here today for evaluation and treatment of the symptoms which she has been having for the past 24-48 hours.   Past Medical History:  Diagnosis Date  . Alcoholic pancreatitis   . Depression   . Diabetes mellitus without complication ([place])   . DKA (diabetic ketoacidoses) ([place]) 02/17/2015  . Emphysematous cystitis   . Hypercholesteremia   . Hypokalemia     Patient Active Problem List   Diagnosis Date Noted  . Abdominal pain   . Acute renal failure ([place])   . Nausea vomiting and diarrhea   . Smoker   . Poorly controlled type 2 diabetes mellitus ([place])   . Acute renal insufficiency 10/29/2015  . Elevated troponin 10/29/2015  . Urinary tract infection without hematuria 10/29/2015  . Left flank pain 10/29/2015  . Malignant  essential hypertension 10/29/2015  . DKA (diabetic ketoacidoses) ([place]) 02/17/2015  . Emphysematous cystitis     Past Surgical History:  Procedure Laterality Date  . ABDOMINAL HYSTERECTOMY  1996  . APPENDECTOMY  1997  . HAND SURGERY  1988  . THYROID SURGERY  2013    Prior to Admission medications   Medication Sig Start Date End Date Taking? Authorizing Provider  amitriptyline (ELAVIL) 25 MG tablet Take 1 tablet (25 mg total) by mouth at bedtime. 11/01/15  Yes Srikar Sudini, MD  amLODipine (NORVASC) 5 MG tablet Take 1 tablet (5 mg total) by mouth daily. 11/02/15  Yes Srikar Sudini, MD  butalbital-acetaminophen-caffeine (FIORICET, ESGIC) 50-325-40 MG tablet Take 2 tablets by mouth every 12 (twelve) hours as needed for headache. 11/01/15  Yes Srikar Sudini, MD  insulin aspart protamine - aspart (NOVOLOG MIX 70/30 FLEXPEN) (70-30) 100 UNIT/ML FlexPen Inject 15 Units into the skin 2 (two) times daily.   Yes Historical Provider, MD  lisinopril (PRINIVIL,ZESTRIL) 40 MG tablet Take 1 tablet (40 mg total) by mouth daily. 11/01/15  Yes Srikar Sudini, MD  metoCLOPramide (REGLAN) 5 MG tablet Take 1 tablet (5 mg total) by mouth 3 (three) times daily before meals. 11/01/15  Yes Srikar Sudini, MD  metoprolol tartrate (LOPRESSOR) 25 MG tablet Take 1 tablet (25 mg total) by mouth 2 (two) times daily. 11/01/15  Yes Srikar Sudini, MD  oxyCODONE-acetaminophen (PERCOCET) 5-325 MG tablet Take 2 tablets by mouth every 6 (six) hours as needed for moderate  pain or severe pain. 05/14/15  Yes Earleen Newport, MD  pantoprazole (PROTONIX) 40 MG tablet Take 1 tablet (40 mg total) by mouth daily. 11/01/15  Yes Srikar Sudini, MD  ciprofloxacin (CIPRO) 500 MG tablet Take 1 tablet (500 mg total) by mouth 2 (two) times daily. Patient not taking: Reported on 12/10/2015 11/01/15   Hillary Bow, MD    Allergies Penicillins and Percocet [oxycodone-acetaminophen]  Family History  Problem Relation Age of Onset  . Family  history unknown: Yes    Social History Social History  Substance Use Topics  . Smoking status: Former Research scientist (life sciences)  . Smokeless tobacco: Never Used  . Alcohol use Yes     Comment: daily    Review of Systems Constitutional:  fever/chills Eyes: No visual changes. ENT:  sore throat. Cardiovascular: chest pain. Respiratory: Denies shortness of breath. Gastrointestinal: abdominal pain, nausea, vomiting.  No diarrhea.  No constipation. Genitourinary: Negative for dysuria. Musculoskeletal: Negative for back pain. Skin: Negative for rash. Neurological: Headache  10-point ROS otherwise negative.  ____________________________________________   PHYSICAL EXAM:  VITAL SIGNS: ED Triage Vitals  Enc Vitals Group     BP 12/10/15 0454 (!) 197/96     Pulse Rate 12/10/15 0454 (!) 107     Resp 12/10/15 0454 (!) 24     Temp 12/10/15 0454 98.1 F (36.7 C)     Temp Source 12/10/15 0454 Oral     SpO2 12/10/15 0454 99 %     Weight 12/10/15 0451 114 lb (51.7 kg)     Height 12/10/15 0451 5\' 1"  (1.549 m)     Head Circumference --      Peak Flow --      Pain Score 12/10/15 0452 10     Pain Loc --      Pain Edu? --      Excl. in [place]? --     Constitutional: Alert and oriented. Well appearing and in moderate distress. Eyes: Conjunctivae are normal. PERRL. EOMI. Head: Atraumatic. Nose: No congestion/rhinnorhea. Mouth/Throat: Mucous membranes are moist.  Oropharynx non-erythematous. Cardiovascular: Normal rate, regular rhythm. Grossly normal heart sounds.  Good peripheral circulation. Respiratory: Normal respiratory effort.  No retractions. Lungs CTAB. Gastrointestinal: Soft with epigastric pain. No distention. Positive bowels sounds Musculoskeletal: No lower extremity tenderness nor edema.  No joint effusions. Neurologic:  Normal speech and language.  Skin:  Skin is warm, dry and intact.  Psychiatric: Mood and affect are normal.   ____________________________________________   LABS (all  labs ordered are listed, but only abnormal results are displayed)  Labs Reviewed  LIPASE, BLOOD - Abnormal; Notable for the following:       Result Value   Lipase 77 (*)    All other components within normal limits  COMPREHENSIVE METABOLIC PANEL - Abnormal; Notable for the following:    Chloride 91 (*)    CO2 21 (*)    Glucose, Bld 502 (*)    BUN 34 (*)    Creatinine, Ser 1.51 (*)    Total Protein 9.5 (*)    Alkaline Phosphatase 175 (*)    Total Bilirubin 1.7 (*)    GFR calc non Af Amer 37 (*)    GFR calc Af Amer 43 (*)    Anion gap 26 (*)    All other components within normal limits  CBC - Abnormal; Notable for the following:    WBC 12.3 (*)    All other components within normal limits  GLUCOSE, CAPILLARY - Abnormal; Notable for the following:  Glucose-Capillary 438 (*)    All other components within normal limits  TROPONIN I  URINALYSIS COMPLETEWITH MICROSCOPIC ([facility] ONLY)  LACTIC ACID, PLASMA  LACTIC ACID, PLASMA  BLOOD GAS, VENOUS  ETHANOL   ____________________________________________  EKG  ED ECG REPORT I, Loney Hering, the attending physician, personally viewed and interpreted this ECG.   Date: 12/10/2015  EKG Time: 827  Rate: 102  Rhythm: normal sinus rhythm  Axis: normal  Intervals:none  ST&T Change: none  ____________________________________________  RADIOLOGY  [place] RUQ ____________________________________________   PROCEDURES  Procedure(s) performed: None  Procedures  Critical Care performed: Yes, see critical care note(s)   CRITICAL CARE Performed by: Charlesetta Ivory P   Total critical care time: 30 minutes  Critical care time was exclusive of separately billable procedures and treating other patients.  Critical care was necessary to treat or prevent imminent or life-threatening deterioration.  Critical care was time spent personally by me on the following activities: development of treatment plan with patient and/or  surrogate as well as nursing, discussions with consultants, evaluation of patient's response to treatment, examination of patient, obtaining history from patient or surrogate, ordering and performing treatments and interventions, ordering and review of laboratory studies, ordering and review of radiographic studies, pulse oximetry and re-evaluation of patient's condition.   ____________________________________________   INITIAL IMPRESSION / ASSESSMENT AND PLAN / ED COURSE  Pertinent labs & imaging results that were available during my care of the patient were reviewed by me and considered in my medical decision making (see chart for details).  This is a 60 year old who comes into the hospital today with fevers, body aches, vomiting. The patient's blood sugar is very high. The patient's lipase is also elevated. I will send the patient for an ultrasound to evaluate her gallbladder. She'll receive a liter of normal saline and some Zofran. I will reassess the patient once I received the results of her blood work. The patient also appears to have an anion gap is elevated at 26.  Clinical Course as of Dec 09 833  Tue Dec 10, 2015  0803 No evidence of gallstones or biliary dilatation.  Mild hepatic steatosis.   US Abdomen Limited RUQ [AW]    Clinical Course User Index [AW] Loney Hering, MD   It seems like the patient has some diabetic ketoacidosis. At this time we're waiting the results of her lactic acid and her VBG. I will admit the patient to the hospitalist service. She'll be placed on a insulin drip. The patient also has pancreatitis. Her ultrasound is unremarkable. She will receive a second liter of normal saline as well.  ____________________________________________   FINAL CLINICAL IMPRESSION(S) / ED DIAGNOSES  Final diagnoses:  Abdominal pain  Nausea and vomiting, intractability of vomiting not specified, unspecified vomiting type  Acute pancreatitis, unspecified  complication status, unspecified pancreatitis type  Diabetic ketoacidosis without coma associated with type 2 diabetes mellitus ([place])      NEW MEDICATIONS STARTED DURING THIS VISIT:  New Prescriptions   No medications on file     Note:  This document was prepared using Dragon voice recognition software and may include unintentional dictation errors.    Loney Hering, MD 12/10/15 [phone]

## 2015-12-10 NOTE — Progress Notes (Addendum)
The patient is off insulin drip since AG down to 10. She vomited and was given Reglan 1 dose at 3 pm. At about 4 pm, her blood pressure suddenly dropped to 62/43. She complained of SOB and chills,  and is found hypoxia, put on NRB.  Physical examinations: The patient is alert awake oriented. S1 and S2 regular rate and rhythm. Bilateral lung sounds clear. No abdominal tenderness or distention. But skin become red and flush.  A/P: Hypotension. NS bolus stat. Blood pressure increased to 10/69 after normal saline bolus. Continue normal saline IV. Follow-up Closely.  Acute respiratory failure with hypoxia. ABG: PH 7.42, PO2 384, PCO2 34. Continue NRB for now, try to change to O2 [place].  Possible due to allergy reaction to reglan.   Additional critical care time: 41 minutes.

## 2015-12-10 NOTE — Progress Notes (Addendum)
PO medications given to patient, but shortly after patient vomited. Dr. Bridgett Larsson notified and placed order for pharmacy consult to manage electrolytes. Patient able to eat small amount of lunch before emesis. Patient had received 70/30 while eating before emesis. Received order for reglan since too early for zofran. Karen [name]

## 2015-12-10 NOTE — Progress Notes (Signed)
PULMONARY / CRITICAL CARE MEDICINE   Name: Karen [name] MRN: [id number] DOB: 02-27-1955    ADMISSION DATE:  12/10/2015 CONSULTATION DATE:  12/10/15  REFERRING MD :  Dr. Bridgett Larsson   CHIEF COMPLAINT:   Hypoxemia and acute hypotension   HISTORY OF PRESENT ILLNESS   Called to evaluate patient with DKA, now with closed AG, but had a sudden drop in SBP to 70s and desaturated to 85%.  [place] physician initially saw patient, placed on her NRB, and give 1L ns bolus. At the time of my arrival patient noted to be shivering, but responsive and alert, SBP in the 130s, and Dr. Bridgett Larsson (Hospitalist) at the bedside.  Further history revealed that patient had gotten a dose of reglan for nausea about 1 hour prior to the event.  Currently, patient is stable and mentating well, with stable vitals.   Further history per chart review 60 y.o. female with a known history of Hypertension, diabetes, DKA, alcohol intake pancreatitis and UTI. The patient presents to the ED with the nausea, vomiting and abdominal pain for 2 days. She also complains of headache, urea and urine frequency. She denies any fever or chills, no diarrhea, melena or bloody stool. AG is 26, PH 7.31. She is started with insulin drip   SIGNIFICANT EVENTS   12/5 - admitted for DKA, got reglan for nausea, after 1 hour had sudden hypotenison, flushing and hypoxemia, recovered quickly with NRB, 1L bolus NS    PAST MEDICAL HISTORY    :  Past Medical History:  Diagnosis Date  . Alcoholic pancreatitis   . Depression   . Diabetes mellitus without complication ([place])   . DKA (diabetic ketoacidoses) ([place]) 02/17/2015  . Emphysematous cystitis   . Hypercholesteremia   . Hypokalemia    Past Surgical History:  Procedure Laterality Date  . ABDOMINAL HYSTERECTOMY  1996  . APPENDECTOMY  1997  . HAND SURGERY  1988  . THYROID SURGERY  2013   Prior to Admission medications   Medication Sig Start Date End Date Taking? Authorizing Provider   amitriptyline (ELAVIL) 25 MG tablet Take 1 tablet (25 mg total) by mouth at bedtime. 11/01/15  Yes Srikar Sudini, MD  amLODipine (NORVASC) 5 MG tablet Take 1 tablet (5 mg total) by mouth daily. 11/02/15  Yes Srikar Sudini, MD  butalbital-acetaminophen-caffeine (FIORICET, ESGIC) 50-325-40 MG tablet Take 2 tablets by mouth every 12 (twelve) hours as needed for headache. 11/01/15  Yes Srikar Sudini, MD  insulin aspart protamine - aspart (NOVOLOG MIX 70/30 FLEXPEN) (70-30) 100 UNIT/ML FlexPen Inject 15 Units into the skin 2 (two) times daily.   Yes Historical Provider, MD  lisinopril (PRINIVIL,ZESTRIL) 40 MG tablet Take 1 tablet (40 mg total) by mouth daily. 11/01/15  Yes Srikar Sudini, MD  metoCLOPramide (REGLAN) 5 MG tablet Take 1 tablet (5 mg total) by mouth 3 (three) times daily before meals. 11/01/15  Yes Srikar Sudini, MD  metoprolol tartrate (LOPRESSOR) 25 MG tablet Take 1 tablet (25 mg total) by mouth 2 (two) times daily. 11/01/15  Yes Srikar Sudini, MD  oxyCODONE-acetaminophen (PERCOCET) 5-325 MG tablet Take 2 tablets by mouth every 6 (six) hours as needed for moderate pain or severe pain. 05/14/15  Yes Earleen Newport, MD  pantoprazole (PROTONIX) 40 MG tablet Take 1 tablet (40 mg total) by mouth daily. 11/01/15  Yes Srikar Sudini, MD  ciprofloxacin (CIPRO) 500 MG tablet Take 1 tablet (500 mg total) by mouth 2 (two) times daily. Patient not taking: Reported on 12/10/2015  11/01/15   Hillary Bow, MD   Allergies  Allergen Reactions  . Penicillins Anaphylaxis    Has patient had a PCN reaction causing immediate rash, facial/tongue/throat swelling, SOB or lightheadedness with hypotension: Yes Has patient had a PCN reaction causing severe rash involving mucus membranes or skin necrosis: No Has patient had a PCN reaction that required hospitalization No Has patient had a PCN reaction occurring within the last 10 years: No If all of the above answers are "NO", then may proceed with Cephalosporin  use.  Marland Kitchen Percocet [Oxycodone-Acetaminophen] Rash     FAMILY HISTORY   Family History  Problem Relation Age of Onset  . Family history unknown: Yes      SOCIAL HISTORY    reports that she has quit smoking. She has never used smokeless tobacco. She reports that she drinks alcohol. Her drug history is not on file.  Review of Systems  Constitutional: Positive for chills and malaise/fatigue. Negative for weight loss.  HENT: Negative for hearing loss.   Eyes: Negative for blurred vision and double vision.  Respiratory: Positive for shortness of breath. Negative for cough, hemoptysis, sputum production and wheezing.   Cardiovascular: Negative for chest pain, palpitations and orthopnea.  Gastrointestinal: Positive for abdominal pain, nausea and vomiting.  Genitourinary: Positive for dysuria.  Musculoskeletal: Negative for myalgias.  Skin: Negative for rash.  Endo/Heme/Allergies: Does not bruise/bleed easily.  Psychiatric/Behavioral: Negative for depression.      VITAL SIGNS    Temp:  [98.1 F (36.7 C)-98.3 F (36.8 C)] 98.3 F (36.8 C) (12/05 1100) Pulse Rate:  [84-107] 98 (12/05 1409) Resp:  [15-24] 20 (12/05 1400) BP: (121-200)/(60-96) 121/60 (12/05 1409) SpO2:  [95 %-100 %] 96 % (12/05 1400) Weight:  [114 lb (51.7 kg)] 114 lb (51.7 kg) (12/05 0451) HEMODYNAMICS:   VENTILATOR SETTINGS:   INTAKE / OUTPUT:  Intake/Output Summary (Last 24 hours) at 12/10/15 1632 Last data filed at 12/10/15 1533  Gross per 24 hour  Intake          2907.93 ml  Output              650 ml  Net          2257.93 ml       PHYSICAL EXAM   Physical Exam  Constitutional: She is oriented to person, place, and time. She appears well-developed. No distress.  Eyes: Pupils are equal, round, and reactive to light.  Neck: Normal range of motion. Neck supple.  Cardiovascular: Normal rate, regular rhythm, normal heart sounds and intact distal pulses.   No murmur heard. Pulmonary/Chest:  Effort normal and breath sounds normal. No respiratory distress. She has no wheezes. She has no rales.  Abdominal: Soft. There is tenderness.  Mild RUQ tenderness  Musculoskeletal: Normal range of motion. She exhibits no edema.  Neurological: She is alert and oriented to person, place, and time.  Skin: Skin is warm and dry. She is not diaphoretic. There is erythema.  Mild generalized erythema/flushing especially of the upper extermities  Psychiatric: She has a normal mood and affect.  Vitals reviewed.      LABS   LABS:  CBC  Recent Labs Lab 12/10/15 0456  WBC 12.3*  HGB 14.7  HCT 42.6  PLT 182   Coag's No results for input(s): APTT, INR in the last 168 hours. BMET  Recent Labs Lab 12/10/15 0456 12/10/15 1134 12/10/15 1538  NA 138 141 141  K 3.9 3.4* 2.9*  CL 91* 109 110  CO2  21* 22 23  BUN 34* 33* 28*  CREATININE 1.51* 1.11* 1.14*  GLUCOSE 502* 283* 141*   Electrolytes  Recent Labs Lab 12/10/15 0456 12/10/15 1134 12/10/15 1538  CALCIUM 9.7 7.8* 7.7*  MG  --  2.0  --   PHOS  --  3.6  --    Sepsis Markers  Recent Labs Lab 12/10/15 0844  LATICACIDVEN 1.1   ABG  Recent Labs Lab 12/10/15 1610  PHART 7.42  PCO2ART 34  PO2ART 384*   Liver Enzymes  Recent Labs Lab 12/10/15 0456  AST 29  ALT 29  ALKPHOS 175*  BILITOT 1.7*  ALBUMIN 4.6   Cardiac Enzymes  Recent Labs Lab 12/10/15 0456  TROPONINI <0.03   Glucose  Recent Labs Lab 12/10/15 1132 12/10/15 1238 12/10/15 1341 12/10/15 1436 12/10/15 1521 12/10/15 1627  GLUCAP 265* 200* 233* 150* 131* 97     No results found for this or any previous visit (from the past 240 hour(s)).   Current Facility-Administered Medications:  .  0.9 %  sodium chloride infusion, , Intravenous, Continuous, Demetrios Loll, MD, Stopped at 12/10/15 1242 .  0.9 %  sodium chloride infusion, , Intravenous, Continuous, Demetrios Loll, MD, Last Rate: 999 mL/hr at 12/10/15 1600 .  amitriptyline (ELAVIL) tablet  25 mg, 25 mg, Oral, QHS, Demetrios Loll, MD .  amLODipine (NORVASC) tablet 5 mg, 5 mg, Oral, Daily, Demetrios Loll, MD, 5 mg at 12/10/15 1410 .  dextrose 5 %-0.45 % sodium chloride infusion, , Intravenous, Continuous, Demetrios Loll, MD, Stopped at 12/10/15 1624 .  heparin injection 5,000 Units, 5,000 Units, Subcutaneous, Q8H, Demetrios Loll, MD, 5,000 Units at 12/10/15 1409 .  hydrALAZINE (APRESOLINE) injection 10 mg, 10 mg, Intravenous, Q6H PRN, Demetrios Loll, MD .  insulin aspart (novoLOG) injection 0-5 Units, 0-5 Units, Subcutaneous, QHS, Demetrios Loll, MD .  insulin aspart (novoLOG) injection 0-9 Units, 0-9 Units, Subcutaneous, TID WC, Demetrios Loll, MD .  insulin aspart protamine- aspart (NOVOLOG MIX 70/30) injection 15 Units, 15 Units, Subcutaneous, BID WC, Demetrios Loll, MD, 15 Units at 12/10/15 1410 .  insulin regular (NOVOLIN R,HUMULIN R) 250 Units in sodium chloride 0.9 % 250 mL (1 Units/mL) infusion, , Intravenous, Continuous, Loney Hering, MD, Stopped at 12/10/15 1625 .  morphine 4 MG/ML injection 2 mg, 2 mg, Intravenous, Q4H PRN, Demetrios Loll, MD, 2 mg at 12/10/15 1415 .  ondansetron (ZOFRAN) injection 4 mg, 4 mg, Intravenous, Q6H PRN, Demetrios Loll, MD, 4 mg at 12/10/15 1119 .  oxyCODONE-acetaminophen (PERCOCET/ROXICET) 5-325 MG per tablet 2 tablet, 2 tablet, Oral, Q6H PRN, Demetrios Loll, MD, 1 tablet at 12/10/15 0930 .  pantoprazole (PROTONIX) EC tablet 40 mg, 40 mg, Oral, Daily, Demetrios Loll, MD, 40 mg at 12/10/15 1409 .  potassium chloride 30 mEq in sodium chloride 0.9 % 265 mL (KCL MULTIRUN) IVPB, 30 mEq, Intravenous, Once, 30 mEq at 12/10/15 1533 **FOLLOWED BY** [DISCONTINUED] potassium chloride 30 mEq in sodium chloride 0.9 % 265 mL (KCL MULTIRUN) IVPB, 30 mEq, Intravenous, Once, Demetrios Loll, MD .  potassium chloride SA (K-DUR,KLOR-CON) CR tablet 20 mEq, 20 mEq, Oral, Daily, Demetrios Loll, MD, 20 mEq at 12/10/15 1410 .  sodium chloride 0.9 % bolus 1,000 mL, 1,000 mL, Intravenous, Once, Rush Farmer, MD  IMAGING    Dg Chest  Port 1 View  Result Date: 12/10/2015 CLINICAL DATA:  Acute respiratory failure EXAM: PORTABLE CHEST 1 VIEW COMPARISON:  08/09/2015 FINDINGS: The heart size and mediastinal contours are within normal limits. Both lungs are  clear. The visualized skeletal structures are unremarkable. IMPRESSION: No active disease. Electronically Signed   By: Inez Catalina M.D.   On: 12/10/2015 16:12   US Abdomen Limited Ruq  Result Date: 12/10/2015 CLINICAL DATA:  Diffuse abdominal pain and vomiting for 1 day. EXAM: US ABDOMEN LIMITED - RIGHT UPPER QUADRANT COMPARISON:  10/31/2015 FINDINGS: Gallbladder: No gallstones or wall thickening visualized. No sonographic Murphy sign noted by sonographer. Common bile duct: Diameter: 4 mm, within normal limits. Liver: Mildly increased echogenicity of the hepatic parenchyma, consistent with hepatic steatosis. No focal mass lesion identified. IMPRESSION: No evidence of gallstones or biliary dilatation. Mild hepatic steatosis. Electronically Signed   By: Earle Gell M.D.   On: 12/10/2015 07:29      Indwelling Urinary Catheter continued, requirement due to   Reason to continue Indwelling Urinary Catheter for strict Intake/Output monitoring for hemodynamic instability   Central Line continued, requirement due to   Reason to continue [employer] Monitoring of central venous pressure or other hemodynamic parameters   Ventilator continued, requirement due to, resp failure    Ventilator Sedation RASS 0 to -2   Cultures: BCx2  UC  Sputum  Antibiotics:   Lines:   ASSESSMENT/PLAN   60 yo female with Hx of DM2, admitted for DKA, then developed acute hypoxemia, hypotension and AMS, about 1 hr after reglan initiation.  Now stable.   DKA Hypotension Acute Respiratory failure Nausea Acute Renal failure Dehydration Leukocytosis  Plan - DKA resolving well, anion gap has closed - hypotension is now resolved after 1L NS bolus - back to baseline mentation  - ABG  reassuring, with PO2>300 - transition back to [place] - keep O2 sat > 92% - cont to monitor BP - check blood cultures and procalcitonin - follow up Ur Cx.     I have personally obtained a history, examined the patient, evaluated laboratory and imaging results, formulated the assessment and plan and placed orders.  The Patient requires high complexity decision making for assessment and support, frequent evaluation and titration of therapies, application of advanced monitoring technologies and extensive interpretation of multiple databases. Critical Care Time devoted to patient care services described in this note is 40 minutes.   Overall, patient is critically ill, prognosis is guarded. Patient at high risk for cardiac arrest and death.   Vilinda Boehringer, MD [facility] Pager [phone] (please enter 7-digits) On Call Pager [phone] (please enter 7-digits)     12/10/2015, 4:32 PM  Note: This note was prepared with Dragon dictation along with smaller phrase technology. Any transcriptional errors that result from this process are unintentional.

## 2015-12-10 NOTE — Progress Notes (Signed)
MEDICATION RELATED CONSULT NOTE - INITIAL   Pharmacy Consult for Electrolyte management  Indication: DKA (insulin gtt)   Allergies  Allergen Reactions  . Penicillins Anaphylaxis    Has patient had a PCN reaction causing immediate rash, facial/tongue/throat swelling, SOB or lightheadedness with hypotension: Yes Has patient had a PCN reaction causing severe rash involving mucus membranes or skin necrosis: No Has patient had a PCN reaction that required hospitalization No Has patient had a PCN reaction occurring within the last 10 years: No If all of the above answers are "NO", then may proceed with Cephalosporin use.  Marland Kitchen Percocet [Oxycodone-Acetaminophen] Rash    Patient Measurements: Height: 5\' 1"  (154.9 cm) Weight: 114 lb (51.7 kg) IBW/kg (Calculated) : 47.8 Adjusted Body Weight:   Vital Signs: Temp: 98.3 F (36.8 C) (12/05 1100) Temp Source: Oral (12/05 1100) BP: 121/60 (12/05 1409) Pulse Rate: 98 (12/05 1409) Intake/Output from previous day: 12/04 0701 - 12/05 0700 In: 1000 [IV Piggyback:1000] Out: -  Intake/Output from this shift: Total I/O In: 1907.9 [I.V.:1907.9] Out: 250 [Urine:250]  Labs:  Recent Labs  12/10/15 0456 12/10/15 1134  WBC 12.3*  --   HGB 14.7  --   HCT 42.6  --   PLT 182  --   CREATININE 1.51* 1.11*  MG  --  2.0  PHOS  --  3.6  ALBUMIN 4.6  --   PROT 9.5*  --   AST 29  --   ALT 29  --   ALKPHOS 175*  --   BILITOT 1.7*  --    Estimated Creatinine Clearance: 41.7 mL/min (by C-G formula based on SCr of 1.11 mg/dL (H)).   Microbiology: No results found for this or any previous visit (from the past 720 hour(s)).  Medical History: Past Medical History:  Diagnosis Date  . Alcoholic pancreatitis   . Depression   . Diabetes mellitus without complication ([place])   . DKA (diabetic ketoacidoses) ([place]) 02/17/2015  . Emphysematous cystitis   . Hypercholesteremia   . Hypokalemia     Medications:  Prescriptions Prior to Admission   Medication Sig Dispense Refill Last Dose  . amitriptyline (ELAVIL) 25 MG tablet Take 1 tablet (25 mg total) by mouth at bedtime. 30 tablet 0 12/09/2015 at Unknown time  . amLODipine (NORVASC) 5 MG tablet Take 1 tablet (5 mg total) by mouth daily. 30 tablet 0 12/09/2015 at Unknown time  . butalbital-acetaminophen-caffeine (FIORICET, ESGIC) 50-325-40 MG tablet Take 2 tablets by mouth every 12 (twelve) hours as needed for headache. 20 tablet 0   . insulin aspart protamine - aspart (NOVOLOG MIX 70/30 FLEXPEN) (70-30) 100 UNIT/ML FlexPen Inject 15 Units into the skin 2 (two) times daily.   12/09/2015 at Unknown time  . lisinopril (PRINIVIL,ZESTRIL) 40 MG tablet Take 1 tablet (40 mg total) by mouth daily. 30 tablet 0 12/09/2015 at Unknown time  . metoCLOPramide (REGLAN) 5 MG tablet Take 1 tablet (5 mg total) by mouth 3 (three) times daily before meals. 90 tablet 0 12/09/2015 at Unknown time  . metoprolol tartrate (LOPRESSOR) 25 MG tablet Take 1 tablet (25 mg total) by mouth 2 (two) times daily. 60 tablet 0 12/09/2015 at Unknown time  . oxyCODONE-acetaminophen (PERCOCET) 5-325 MG tablet Take 2 tablets by mouth every 6 (six) hours as needed for moderate pain or severe pain. 12 tablet 0 prn  . pantoprazole (PROTONIX) 40 MG tablet Take 1 tablet (40 mg total) by mouth daily. 30 tablet 0 12/09/2015 at Unknown time  . ciprofloxacin (CIPRO)  500 MG tablet Take 1 tablet (500 mg total) by mouth 2 (two) times daily. (Patient not taking: Reported on 12/10/2015) 6 tablet 0 Completed Course at Unknown time   Scheduled:  . amitriptyline  25 mg Oral QHS  . amLODipine  5 mg Oral Daily  . heparin  5,000 Units Subcutaneous Q8H  . insulin aspart  0-5 Units Subcutaneous QHS  . insulin aspart  0-9 Units Subcutaneous TID WC  . insulin aspart protamine- aspart  15 Units Subcutaneous BID WC  . metoprolol tartrate  25 mg Oral BID  . pantoprazole  40 mg Oral Daily  . potassium chloride (KCL MULTIRUN) 30 mEq in 265 mL IVPB  30 mEq  Intravenous Once  . potassium chloride  20 mEq Oral Daily   Infusions:  . sodium chloride Stopped (12/10/15 1242)  . dextrose 5 % and 0.45% NaCl 125 mL/hr at 12/10/15 1242  . insulin (NOVOLIN-R) infusion 4.5 Units/hr (12/10/15 1436)    Assessment:  Pharmacy consulted to management electrolytes in this 60 year old woman admitted with DKA. Patient being transitioned from insulin gtt. Insulin gtt scheduled to be stopped @ 1600 today.  K= 3.4  Goal of Therapy:  Normalization of electrolytes  Plan:  Will give KCl 30 mEq IV x 1. Will recheck electrolytes in am.    Lamika Connolly D 12/10/2015,2:40 PM

## 2015-12-10 NOTE — Progress Notes (Signed)
Inpatient Diabetes Program Recommendations  AACE/ADA: New Consensus Statement on Inpatient Glycemic Control (2015)  Target Ranges:  Prepandial:   less than 140 mg/dL      Peak postprandial:   less than 180 mg/dL (1-2 hours)      Critically ill patients:  140 - 180 mg/dL   Review of Glycemic Control  A1c 9.7% on 10/29/15  Diabetes history: DM 2 Outpatient Diabetes medications: 70/30 15 units BID Current orders for Inpatient glycemic control: IV insulin (admitted with DKA)  Inpatient Diabetes Program Recommendations:   Patient has a history of sometimes not taking her insulin due to having her stomach feel upset and feeling bad per note in October from that admission. Patient placed on home meds at that time in addition to Novolog Correction and Novolog bedtime scale. Sees Dr. Brynda [facility] Outpatient.  At time of transition per MD, please consider starting patient's home dose of 70/30 15 units BID (if eating only), Novolog Sensitive (0-9 units) TID + Novolog HS (0-5 unit) QHS.  Thanks,  Tama Headings RN, MSN, [facility] Inpatient Diabetes Coordinator Team Pager [phone] (8a-5p)

## 2015-12-11 LAB — BASIC METABOLIC PANEL
Anion gap: 7 (ref 5–15)
BUN: 24 mg/dL — ABNORMAL HIGH (ref 6–20)
CHLORIDE: 113 mmol/L — AB (ref 101–111)
CO2: 21 mmol/L — ABNORMAL LOW (ref 22–32)
CREATININE: 0.93 mg/dL (ref 0.44–1.00)
Calcium: 7.4 mg/dL — ABNORMAL LOW (ref 8.9–10.3)
Glucose, Bld: 170 mg/dL — ABNORMAL HIGH (ref 65–99)
POTASSIUM: 3.7 mmol/L (ref 3.5–5.1)
SODIUM: 141 mmol/L (ref 135–145)

## 2015-12-11 LAB — PROCALCITONIN: PROCALCITONIN: 0.1 ng/mL

## 2015-12-11 LAB — GLUCOSE, CAPILLARY
GLUCOSE-CAPILLARY: 143 mg/dL — AB (ref 65–99)
GLUCOSE-CAPILLARY: 96 mg/dL (ref 65–99)
Glucose-Capillary: 136 mg/dL — ABNORMAL HIGH (ref 65–99)
Glucose-Capillary: 61 mg/dL — ABNORMAL LOW (ref 65–99)
Glucose-Capillary: 104 mg/dL — ABNORMAL HIGH (ref 65–99)

## 2015-12-11 MED ORDER — METOPROLOL TARTRATE 25 MG PO TABS
25.0000 mg | ORAL_TABLET | Freq: Two times a day (BID) | ORAL | Status: DC
  Administered 2015-12-11 (×2): 25 mg via ORAL
  Filled 2015-12-11 (×2): qty 1

## 2015-12-11 MED ORDER — LISINOPRIL 20 MG PO TABS
40.0000 mg | ORAL_TABLET | Freq: Every day | ORAL | Status: DC
  Administered 2015-12-11 – 2015-12-12 (×2): 40 mg via ORAL
  Filled 2015-12-11 (×2): qty 2

## 2015-12-11 NOTE — Progress Notes (Signed)
Patient is A&O x4. Up ad lib in room. BS 143, 96, 136, gave SS insulin coverage as ordered. Appetite inproved without the day. Gave oral med med x1 with good relief.

## 2015-12-11 NOTE — Progress Notes (Signed)
MEDICATION RELATED CONSULT NOTE - INITIAL   Pharmacy Consult for Electrolyte management  Indication: DKA (patient was started on insulin drip)   Allergies  Allergen Reactions  . Penicillins Anaphylaxis    Has patient had a PCN reaction causing immediate rash, facial/tongue/throat swelling, SOB or lightheadedness with hypotension: Yes Has patient had a PCN reaction causing severe rash involving mucus membranes or skin necrosis: No Has patient had a PCN reaction that required hospitalization No Has patient had a PCN reaction occurring within the last 10 years: No If all of the above answers are "NO", then may proceed with Cephalosporin use.  . Reglan [Metoclopramide] Other (See Comments)    Hypotension, shortness of breath  . Percocet [Oxycodone-Acetaminophen] Rash    Patient Measurements: Height: 5\' 1"  (154.9 cm) Weight: 114 lb (51.7 kg) IBW/kg (Calculated) : 47.8   Vital Signs: Temp: 98.2 F (36.8 C) (12/06 0817) Temp Source: Oral (12/06 0817) BP: 139/64 (12/06 0817) Pulse Rate: 84 (12/06 0817) Intake/Output from previous day: 12/05 0701 - 12/06 0700 In: 4173.3 [I.V.:4173.3] Out: 650 [Urine:650] Intake/Output from this shift: No intake/output data recorded.  Labs:  Recent Labs  12/10/15 0456 12/10/15 1134 12/10/15 1538 12/11/15 0350  WBC 12.3*  --   --   --   HGB 14.7  --   --   --   HCT 42.6  --   --   --   PLT 182  --   --   --   CREATININE 1.51* 1.11* 1.14* 0.93  MG  --  2.0  --   --   PHOS  --  3.6  --   --   ALBUMIN 4.6  --   --   --   PROT 9.5*  --   --   --   AST 29  --   --   --   ALT 29  --   --   --   ALKPHOS 175*  --   --   --   BILITOT 1.7*  --   --   --    Estimated Creatinine Clearance: 49.8 mL/min (by C-G formula based on SCr of 0.93 mg/dL).   Assessment/Plan:  Pharmacy was consulted to monitor electrolytes and replace K as needed while patient was on insulin drip. K WNL this morning and insulin drip has been discontinued. Will sign off.    Lenis Noon, PharmD, BCPS Clinical Pharmacist 12/11/2015,9:49 AM

## 2015-12-11 NOTE — Plan of Care (Signed)
Problem: Fluid Volume: Goal: Ability to maintain a balanced intake and output will improve Outcome: Progressing Patient's IV discontinued and instructed patient to continue to drink fluids

## 2015-12-11 NOTE — Progress Notes (Signed)
[place] [facility] [place] NAME: Karen [name]    MR#:  [id number]  DATE OF BIRTH:  06/09/1955  SUBJECTIVE:  CHIEF COMPLAINT:   Chief Complaint  Patient presents with  . Emesis  . Headache  . Abdominal Pain   Still nausea and abdominal pain, no vomiting or diarrhea. REVIEW OF SYSTEMS:  Review of Systems  Constitutional: Positive for malaise/fatigue. Negative for chills and fever.  HENT: Negative for congestion.   Eyes: Negative for blurred vision and double vision.  Respiratory: Negative for cough, shortness of breath and stridor.   Cardiovascular: Negative for chest pain and leg swelling.  Gastrointestinal: Positive for abdominal pain and nausea. Negative for blood in stool, diarrhea, melena and vomiting.  Genitourinary: Negative for dysuria, flank pain, frequency, hematuria and urgency.  Musculoskeletal: Negative for joint pain.  Skin: Negative for itching and rash.  Neurological: Negative for dizziness, focal weakness and loss of consciousness.  Psychiatric/Behavioral: Negative for depression. The patient is not nervous/anxious.     DRUG ALLERGIES:   Allergies  Allergen Reactions  . Penicillins Anaphylaxis    Has patient had a PCN reaction causing immediate rash, facial/tongue/throat swelling, SOB or lightheadedness with hypotension: Yes Has patient had a PCN reaction causing severe rash involving mucus membranes or skin necrosis: No Has patient had a PCN reaction that required hospitalization No Has patient had a PCN reaction occurring within the last 10 years: No If all of the above answers are "NO", then may proceed with Cephalosporin use.  . Reglan [Metoclopramide] Other (See Comments)    Hypotension, shortness of breath  . Percocet [Oxycodone-Acetaminophen] Rash   VITALS:  Blood pressure (!) 154/92, pulse 84, temperature 98 F (36.7 C), temperature source Oral, resp. rate 18, height 5\' 1"  (1.549 m), weight 114 lb (51.7 kg),  SpO2 97 %. PHYSICAL EXAMINATION:  Physical Exam  Constitutional: She is oriented to person, place, and time and well-developed, well-nourished, and in no distress.  HENT:  Head: Normocephalic.  Eyes: Conjunctivae and EOM are normal. No scleral icterus.  Neck: Neck supple. No JVD present. No tracheal deviation present.  Cardiovascular: Normal rate, regular rhythm and normal heart sounds.  Exam reveals no gallop.   No murmur heard. Pulmonary/Chest: Effort normal. No respiratory distress. She has no wheezes. She has no rales.  Abdominal: Soft. Bowel sounds are normal. She exhibits no distension. There is no tenderness.  Musculoskeletal: Normal range of motion. She exhibits no edema or tenderness.  Neurological: She is alert and oriented to person, place, and time. No cranial nerve deficit.  Skin: No rash noted. No erythema.  Psychiatric: Affect and judgment normal.   LABORATORY PANEL:   CBC  Recent Labs Lab 12/10/15 0456  WBC 12.3*  HGB 14.7  HCT 42.6  PLT 182   ------------------------------------------------------------------------------------------------------------------ Chemistries   Recent Labs Lab 12/10/15 0456 12/10/15 1134  12/11/15 0350  NA 138 141  < > 141  K 3.9 3.4*  < > 3.7  CL 91* 109  < > 113*  CO2 21* 22  < > 21*  GLUCOSE 502* 283*  < > 170*  BUN 34* 33*  < > 24*  CREATININE 1.51* 1.11*  < > 0.93  CALCIUM 9.7 7.8*  < > 7.4*  MG  --  2.0  --   --   AST 29  --   --   --   ALT 29  --   --   --   ALKPHOS 175*  --   --   --  BILITOT 1.7*  --   --   --   < > = values in this interval not displayed. RADIOLOGY:  No results found. ASSESSMENT AND PLAN:   DKA with diabetes 2 She was treated with insulin drip, DKA protocol, IV fluid support, Zofran when necessary. AG is normal. Started novolog 70/30 15 units BID and sliding scale.  Accelerated hypertension. Hold lisinopril due to acute renal failure,  Resume Norvasc, Lopressor and lisinopril since renal  failure has improved and blood pressure is elevated, IV hydralazine when necessary.  Acute renal failure due to dehydration, improving with IV fluid support and follow-up BMP.  Elevated lipase and bilirubin. Possible due to nausea and vomiting.  Leukocytosis, possible due to reaction and urinalysis is not impressive for UTI.  Hypotension. Improved with NS. Acute respiratory failure with hypoxia. ABG: PH 7.42, PO2 384, PCO2 34. She was on NRB then O2 . Off O2. Possible due to allergy reaction to reglan.    All the records are reviewed and case discussed with Care Management/Social Worker. Management plans discussed with the patient, family and they are in agreement.  CODE STATUS: Full code  TOTAL TIME TAKING CARE OF THIS PATIENT: 35 minutes.   More than 50% of the time was spent in counseling/coordination of care: YES  POSSIBLE D/C IN 1 DAYS, DEPENDING ON CLINICAL CONDITION.   Demetrios Loll M.D on 12/11/2015 at 6:17 PM  Between 7am to 6pm - Pager - [phone]  After 6pm go to [email or web address] - Proofreader  [facility]  Office  [phone]  CC: Primary care physician; Marden Noble, MD  Note: This dictation was prepared with Dragon dictation along with smaller phrase technology. Any transcriptional errors that result from this process are unintentional.

## 2015-12-11 NOTE — Progress Notes (Signed)
FSBS 61. Pt given applesauce, orange juice, ice cream to eat.,. Will recheck  In 15 minutes. Pt is assymptomatic

## 2015-12-12 LAB — CBC
HCT: 30.5 % — ABNORMAL LOW (ref 35.0–47.0)
HEMOGLOBIN: 10.6 g/dL — AB (ref 12.0–16.0)
MCH: 32.3 pg (ref 26.0–34.0)
MCHC: 34.9 g/dL (ref 32.0–36.0)
MCV: 92.6 fL (ref 80.0–100.0)
Platelets: 147 10*3/uL — ABNORMAL LOW (ref 150–440)
RBC: 3.29 MIL/uL — AB (ref 3.80–5.20)
RDW: 12.3 % (ref 11.5–14.5)
WBC: 8.5 10*3/uL (ref 3.6–11.0)

## 2015-12-12 LAB — GLUCOSE, CAPILLARY
GLUCOSE-CAPILLARY: 122 mg/dL — AB (ref 65–99)
Glucose-Capillary: 82 mg/dL (ref 65–99)

## 2015-12-12 LAB — BASIC METABOLIC PANEL
Anion gap: 5 (ref 5–15)
BUN: 14 mg/dL (ref 6–20)
CHLORIDE: 108 mmol/L (ref 101–111)
CO2: 24 mmol/L (ref 22–32)
CREATININE: 0.76 mg/dL (ref 0.44–1.00)
Calcium: 8.1 mg/dL — ABNORMAL LOW (ref 8.9–10.3)
Glucose, Bld: 99 mg/dL (ref 65–99)
Potassium: 3.7 mmol/L (ref 3.5–5.1)
SODIUM: 137 mmol/L (ref 135–145)

## 2015-12-12 LAB — PROCALCITONIN

## 2015-12-12 LAB — MAGNESIUM: MAGNESIUM: 2 mg/dL (ref 1.7–2.4)

## 2015-12-12 MED ORDER — CIPROFLOXACIN HCL 500 MG PO TABS
500.0000 mg | ORAL_TABLET | Freq: Two times a day (BID) | ORAL | Status: DC
  Administered 2015-12-12: 500 mg via ORAL
  Filled 2015-12-12: qty 1

## 2015-12-12 MED ORDER — AMLODIPINE BESYLATE 10 MG PO TABS: 10.0000 mg | ORAL_TABLET | Freq: Every day | ORAL | 2 refills | Status: DC

## 2015-12-12 MED ORDER — AMLODIPINE BESYLATE 10 MG PO TABS
10.0000 mg | ORAL_TABLET | Freq: Every day | ORAL | Status: DC
  Administered 2015-12-12: 10 mg via ORAL
  Filled 2015-12-12: qty 1

## 2015-12-12 MED ORDER — CIPROFLOXACIN HCL 500 MG PO TABS: 500.0000 mg | ORAL_TABLET | Freq: Two times a day (BID) | ORAL | 0 refills | Status: DC

## 2015-12-12 MED ORDER — METOPROLOL TARTRATE 50 MG PO TABS
50.0000 mg | ORAL_TABLET | Freq: Two times a day (BID) | ORAL | Status: DC
  Administered 2015-12-12: 50 mg via ORAL
  Filled 2015-12-12: qty 1

## 2015-12-12 MED ORDER — METOPROLOL TARTRATE 50 MG PO TABS: 50.0000 mg | ORAL_TABLET | Freq: Two times a day (BID) | ORAL | 2 refills | Status: DC

## 2015-12-12 NOTE — Consult Note (Addendum)
I was notified of a routine inpatient consult around 10:45 this morning, for vaginal discharge. The patient was discharged upon my arrival to her room.  ----- Larey Days, MD Attending Obstetrician and Gynecologist [facility], Department of [facility]

## 2015-12-12 NOTE — Progress Notes (Signed)
Pharmacy Antibiotic Note  Karen [name] is a 61 y.o. female admitted on 12/10/2015 with UTI.  Pharmacy has been consulted for ciprofloxacin dosing.  Plan: Ciprofloxacin 500 mg PO BID  Height: 5\' 1"  (154.9 cm) Weight: 114 lb (51.7 kg) IBW/kg (Calculated) : 47.8  Temp (24hrs), Avg:98.4 F (36.9 C), Min:98 F (36.7 C), Max:99.1 F (37.3 C)   Recent Labs Lab 12/10/15 0456 12/10/15 0844 12/10/15 1134 12/10/15 1538 12/11/15 0350 12/12/15 0456  WBC 12.3*  --   --   --   --  8.5  CREATININE 1.51*  --  1.11* 1.14* 0.93 0.76  LATICACIDVEN  --  1.1  --   --   --   --     Estimated Creatinine Clearance: 57.8 mL/min (by C-G formula based on SCr of 0.76 mg/dL).    Allergies  Allergen Reactions  . Penicillins Anaphylaxis    Has patient had a PCN reaction causing immediate rash, facial/tongue/throat swelling, SOB or lightheadedness with hypotension: Yes Has patient had a PCN reaction causing severe rash involving mucus membranes or skin necrosis: No Has patient had a PCN reaction that required hospitalization No Has patient had a PCN reaction occurring within the last 10 years: No If all of the above answers are "NO", then may proceed with Cephalosporin use.  . Reglan [Metoclopramide] Other (See Comments)    Hypotension, shortness of breath  . Percocet [Oxycodone-Acetaminophen] Rash    Antimicrobials this admission: ciprofloxacin 12/7 >>   Dose adjustments this admission:  Microbiology results: 12/5 BCx: No growth 2 days 12/5 UCx: 60K E. coli   Thank you for allowing pharmacy to be a part of this patient's care.  Lenis Noon, PharmD Clinical Pharmacist 12/12/2015 10:29 AM

## 2015-12-12 NOTE — Discharge Summary (Signed)
[place] [facility] [place] NAME: Karen [name]    MR#:  [id number]  DATE OF BIRTH:  06-15-1955  DATE OF ADMISSION:  12/10/2015   ADMITTING PHYSICIAN: Demetrios Loll, MD  DATE OF DISCHARGE: 12/12/2015 PRIMARY CARE PHYSICIAN: Marden Noble, MD   ADMISSION DIAGNOSIS:  Abdominal pain [R10.9] Diabetic ketoacidosis without coma associated with type 2 diabetes mellitus (HCC) [E13.10] Nausea and vomiting, intractability of vomiting not specified, unspecified vomiting type [R11.2] Acute pancreatitis, unspecified complication status, unspecified pancreatitis type [K85.90] DISCHARGE DIAGNOSIS:  Active Problems:   DKA (diabetic ketoacidoses) ([place])  SECONDARY DIAGNOSIS:   Past Medical History:  Diagnosis Date  . Alcoholic pancreatitis   . Depression   . Diabetes mellitus without complication ([place])   . DKA (diabetic ketoacidoses) ([place]) 02/17/2015  . Emphysematous cystitis   . Hypercholesteremia   . Hypokalemia    HOSPITAL COURSE:   DKA with diabetes 2 She was treated with insulin drip, DKA protocol, IV fluid support, Zofran when necessary. AG is normal. Started novolog 70/30 15 units BID and sliding scale. Blood sugar is controlled.  Accelerated hypertension. Hold lisinopril due to acute renal failure,  Resumed Norvasc, Lopressor and lisinopril since renal failure has improved and blood pressure is elevated, IV hydralazine when necessary. Blood pressure is under control.  Acute renal failure due to dehydration, improved with IV fluid support..  Elevated lipase and bilirubin. Possible due to nausea and vomiting.  Leukocytosis, possible due to reaction and urinalysis is not impressive for UTI, but since she has history of UTI, lower abdominal discomfort and urology problem, give cipro bid for 5 days. Follow-up with urology as outpatient.  Alllergy reaction to reglan causing hypotension and acute respiratory failure.  Hypotension. Improved with  NS. Acute respiratory failure with hypoxia. ABG: PH 7.42, PO2 384, PCO2 34. She was on NRB then O2 [place]. Off O2.  Vaginal discharge, follow-up OB/GYN as outpatient. DISCHARGE CONDITIONS:  Stable, discharge to home today. CONSULTS OBTAINED:   DRUG ALLERGIES:   Allergies  Allergen Reactions  . Penicillins Anaphylaxis    Has patient had a PCN reaction causing immediate rash, facial/tongue/throat swelling, SOB or lightheadedness with hypotension: Yes Has patient had a PCN reaction causing severe rash involving mucus membranes or skin necrosis: No Has patient had a PCN reaction that required hospitalization No Has patient had a PCN reaction occurring within the last 10 years: No If all of the above answers are "NO", then may proceed with Cephalosporin use.  . Reglan [Metoclopramide] Other (See Comments)    Hypotension, shortness of breath  . Percocet [Oxycodone-Acetaminophen] Rash   DISCHARGE MEDICATIONS:     Medication List    TAKE these medications   amitriptyline 25 MG tablet Commonly known as:  ELAVIL Take 1 tablet (25 mg total) by mouth at bedtime.   amLODipine 10 MG tablet Commonly known as:  NORVASC Take 1 tablet (10 mg total) by mouth daily. Start taking on:  12/13/2015 What changed:  medication strength  how much to take   butalbital-acetaminophen-caffeine 50-325-40 MG tablet Commonly known as:  FIORICET, ESGIC Take 2 tablets by mouth every 12 (twelve) hours as needed for headache.   ciprofloxacin 500 MG tablet Commonly known as:  CIPRO Take 1 tablet (500 mg total) by mouth 2 (two) times daily.   lisinopril 40 MG tablet Commonly known as:  PRINIVIL,ZESTRIL Take 1 tablet (40 mg total) by mouth daily.   metoCLOPramide 5 MG tablet Commonly known as:  REGLAN  Take 1 tablet (5 mg total) by mouth 3 (three) times daily before meals.   metoprolol 50 MG tablet Commonly known as:  LOPRESSOR Take 1 tablet (50 mg total) by mouth 2 (two) times daily. What  changed:  medication strength  how much to take   NOVOLOG MIX 70/30 FLEXPEN (70-30) 100 UNIT/ML FlexPen Generic drug:  insulin aspart protamine - aspart Inject 15 Units into the skin 2 (two) times daily.   oxyCODONE-acetaminophen 5-325 MG tablet Commonly known as:  PERCOCET Take 2 tablets by mouth every 6 (six) hours as needed for moderate pain or severe pain.   pantoprazole 40 MG tablet Commonly known as:  PROTONIX Take 1 tablet (40 mg total) by mouth daily.        DISCHARGE INSTRUCTIONS:  See AVS.  If you experience worsening of your admission symptoms, develop shortness of breath, life threatening emergency, suicidal or homicidal thoughts you must seek medical attention immediately by calling 911 or calling your MD immediately  if symptoms less severe.  You Must read complete instructions/literature along with all the possible adverse reactions/side effects for all the Medicines you take and that have been prescribed to you. Take any new Medicines after you have completely understood and accpet all the possible adverse reactions/side effects.   Please note  You were cared for by a hospitalist during your hospital stay. If you have any questions about your discharge medications or the care you received while you were in the hospital after you are discharged, you can call the unit and asked to speak with the hospitalist on call if the hospitalist that took care of you is not available. Once you are discharged, your primary care physician will handle any further medical issues. Please note that NO REFILLS for any discharge medications will be authorized once you are discharged, as it is imperative that you return to your primary care physician (or establish a relationship with a primary care physician if you do not have one) for your aftercare needs so that they can reassess your need for medications and monitor your lab values.    On the day of Discharge:  VITAL SIGNS:  Blood  pressure 139/83, pulse 86, temperature 98.7 F (37.1 C), temperature source Oral, resp. rate 16, height 5\' 1"  (1.549 m), weight 114 lb (51.7 kg), SpO2 98 %. PHYSICAL EXAMINATION:  GENERAL:  60 y.o.-year-old patient lying in the bed with no acute distress.  EYES: Pupils equal, round, reactive to light and accommodation. No scleral icterus. Extraocular muscles intact.  HEENT: Head atraumatic, normocephalic. Oropharynx and nasopharynx clear.  NECK:  Supple, no jugular venous distention. No thyroid enlargement, no tenderness.  LUNGS: Normal breath sounds bilaterally, no wheezing, rales,rhonchi or crepitation. No use of accessory muscles of respiration.  CARDIOVASCULAR: S1, S2 normal. No murmurs, rubs, or gallops.  ABDOMEN: Soft, non-tender, non-distended. Bowel sounds present. No organomegaly or mass.  EXTREMITIES: No pedal edema, cyanosis, or clubbing.  NEUROLOGIC: Cranial nerves II through XII are intact. Muscle strength 5/5 in all extremities. Sensation intact. Gait not checked.  PSYCHIATRIC: The patient is alert and oriented x 3.  SKIN: No obvious rash, lesion, or ulcer.  DATA REVIEW:   CBC  Recent Labs Lab 12/12/15 0456  WBC 8.5  HGB 10.6*  HCT 30.5*  PLT 147*    Chemistries   Recent Labs Lab 12/10/15 0456  12/12/15 0456  NA 138  < > 137  K 3.9  < > 3.7  CL 91*  < > 108  CO2 21*  < > 24  GLUCOSE 502*  < > 99  BUN 34*  < > 14  CREATININE 1.51*  < > 0.76  CALCIUM 9.7  < > 8.1*  MG  --   < > 2.0  AST 29  --   --   ALT 29  --   --   ALKPHOS 175*  --   --   BILITOT 1.7*  --   --   < > = values in this interval not displayed.   Microbiology Results  Results for orders placed or performed during the hospital encounter of 12/10/15  Urine culture     Status: Abnormal (Preliminary result)   Collection Time: 12/10/15 10:51 AM  Result Value Ref Range Status   Specimen Description URINE, RANDOM  Final   Special Requests NONE  Final   Culture 60,000 COLONIES/mL ESCHERICHIA  COLI (A)  Final   Report Status PENDING  Incomplete  Culture, blood (Routine X 2) w Reflex to ID Panel     Status: None (Preliminary result)   Collection Time: 12/10/15  5:10 PM  Result Value Ref Range Status   Specimen Description BLOOD RIGHT HAND  Final   Special Requests BOTTLES DRAWN AEROBIC AND ANAEROBIC ANA3CC [place]  Final   Culture NO GROWTH 2 DAYS  Final   Report Status PENDING  Incomplete  Culture, blood (Routine X 2) w Reflex to ID Panel     Status: None (Preliminary result)   Collection Time: 12/10/15  6:20 PM  Result Value Ref Range Status   Specimen Description BLOOD  RIGHT WRIST  Final   Special Requests   Final    BOTTLES DRAWN AEROBIC AND ANAEROBIC  [place] ANA 4CC   Culture NO GROWTH 2 DAYS  Final   Report Status PENDING  Incomplete    RADIOLOGY:  No results found.   Management plans discussed with the patient, family and they are in agreement.  CODE STATUS:     Code Status Orders        Start     Ordered   12/10/15 1110  Full code  Continuous     12/10/15 1109    Code Status History    Date Active Date Inactive Code Status Order ID Comments User Context   10/29/2015  3:45 PM 11/01/2015  4:23 PM Full Code [id number]  Theodoro Grist, MD Inpatient   02/17/2015  5:04 PM 02/19/2015  6:04 PM Full Code [id number]  Nicholes Mango, MD Inpatient      TOTAL TIME TAKING CARE OF THIS PATIENT: 36 minutes.    Demetrios Loll M.D on 12/12/2015 at 3:12 PM  Between 7am to 6pm - Pager - [phone]  After 6pm go to [email or web address] - Proofreader  [facility]  Office  [phone]  CC: Primary care physician; Marden Noble, MD   Note: This dictation was prepared with Dragon dictation along with smaller phrase technology. Any transcriptional errors that result from this process are unintentional.

## 2015-12-12 NOTE — Progress Notes (Signed)
Pt. discharged to home via wheelchair. Discharge instructions and medication regimen reviewed at bedside with patient. Pt instructed not to take Reglan at home which she is allergic to.Pt. verbalizes understanding of instructions, follow up appointments, and medication regimen. Patient assessment unchanged from this morning. IV's discontinued per policy.

## 2015-12-12 NOTE — Progress Notes (Signed)
Pt complained of having a yellow-greenish foul smelling vaginal  discharge. She said she would talk to the MD this am . She states she thinks the discharge is causing her to experience back pain. Independent in the room . FSBS WDL during the night. No acute distress noted. l continue to monitor

## 2015-12-12 NOTE — Discharge Instructions (Signed)
Heart healthy and ADA diet. Follow up OBGYN and urology as outpatient.

## 2015-12-13 LAB — URINE CULTURE: Culture: 60000 — AB

## 2015-12-15 LAB — CULTURE, BLOOD (ROUTINE X 2)
CULTURE: NO GROWTH
Culture: NO GROWTH

## 2015-12-30 ENCOUNTER — Emergency Department: Payer: Medicaid Other

## 2015-12-30 ENCOUNTER — Observation Stay
Admission: EM | Admit: 2015-12-30 | Discharge: 2016-01-01 | Disposition: A | Discharge status: Home / Self Care | Payer: Medicaid Other | Attending: Internal Medicine | Admitting: Internal Medicine

## 2015-12-30 ENCOUNTER — Encounter: Payer: Self-pay | Admitting: Occupational Medicine

## 2015-12-30 DIAGNOSIS — R1084 Generalized abdominal pain: Secondary | ICD-10-CM

## 2015-12-30 DIAGNOSIS — E785 Hyperlipidemia, unspecified: Secondary | ICD-10-CM | POA: Diagnosis not present

## 2015-12-30 DIAGNOSIS — R197 Diarrhea, unspecified: Secondary | ICD-10-CM

## 2015-12-30 DIAGNOSIS — I10 Essential (primary) hypertension: Secondary | ICD-10-CM | POA: Diagnosis not present

## 2015-12-30 DIAGNOSIS — R739 Hyperglycemia, unspecified: Secondary | ICD-10-CM

## 2015-12-30 DIAGNOSIS — R42 Dizziness and giddiness: Secondary | ICD-10-CM

## 2015-12-30 DIAGNOSIS — R112 Nausea with vomiting, unspecified: Secondary | ICD-10-CM

## 2015-12-30 DIAGNOSIS — Z79899 Other long term (current) drug therapy: Secondary | ICD-10-CM | POA: Diagnosis not present

## 2015-12-30 DIAGNOSIS — F329 Major depressive disorder, single episode, unspecified: Secondary | ICD-10-CM | POA: Diagnosis not present

## 2015-12-30 DIAGNOSIS — E860 Dehydration: Secondary | ICD-10-CM | POA: Insufficient documentation

## 2015-12-30 DIAGNOSIS — N3080 Other cystitis without hematuria: Secondary | ICD-10-CM | POA: Diagnosis not present

## 2015-12-30 DIAGNOSIS — Z794 Long term (current) use of insulin: Secondary | ICD-10-CM | POA: Insufficient documentation

## 2015-12-30 DIAGNOSIS — R109 Unspecified abdominal pain: Secondary | ICD-10-CM | POA: Diagnosis present

## 2015-12-30 DIAGNOSIS — E1165 Type 2 diabetes mellitus with hyperglycemia: Secondary | ICD-10-CM | POA: Diagnosis not present

## 2015-12-30 DIAGNOSIS — E1140 Type 2 diabetes mellitus with diabetic neuropathy, unspecified: Secondary | ICD-10-CM | POA: Insufficient documentation

## 2015-12-30 DIAGNOSIS — R339 Retention of urine, unspecified: Secondary | ICD-10-CM

## 2015-12-30 DIAGNOSIS — Z87891 Personal history of nicotine dependence: Secondary | ICD-10-CM | POA: Insufficient documentation

## 2015-12-30 DIAGNOSIS — R51 Headache: Secondary | ICD-10-CM | POA: Diagnosis present

## 2015-12-30 HISTORY — DX: Unspecified asthma, uncomplicated: J45.909

## 2015-12-30 HISTORY — DX: Essential (primary) hypertension: I10

## 2015-12-30 LAB — CBC
HEMATOCRIT: 36.2 % (ref 35.0–47.0)
HEMOGLOBIN: 12.5 g/dL (ref 12.0–16.0)
MCH: 31.1 pg (ref 26.0–34.0)
MCHC: 34.7 g/dL (ref 32.0–36.0)
MCV: 89.8 fL (ref 80.0–100.0)
Platelets: 231 10*3/uL (ref 150–440)
RBC: 4.03 MIL/uL (ref 3.80–5.20)
RDW: 12.1 % (ref 11.5–14.5)
WBC: 11.9 10*3/uL — AB (ref 3.6–11.0)

## 2015-12-30 LAB — BASIC METABOLIC PANEL
ANION GAP: 15 (ref 5–15)
BUN: 26 mg/dL — ABNORMAL HIGH (ref 6–20)
CO2: 21 mmol/L — ABNORMAL LOW (ref 22–32)
Calcium: 8.8 mg/dL — ABNORMAL LOW (ref 8.9–10.3)
Chloride: 102 mmol/L (ref 101–111)
Creatinine, Ser: 1.02 mg/dL — ABNORMAL HIGH (ref 0.44–1.00)
GFR calc Af Amer: >60 mL/min (ref >60)
GFR, EST NON AFRICAN AMERICAN: 59 mL/min — AB (ref >60)
Glucose, Bld: 352 mg/dL — ABNORMAL HIGH (ref 65–99)
POTASSIUM: 3.8 mmol/L (ref 3.5–5.1)
SODIUM: 138 mmol/L (ref 135–145)

## 2015-12-30 LAB — GLUCOSE, CAPILLARY
GLUCOSE-CAPILLARY: 346 mg/dL — AB (ref 65–99)
Glucose-Capillary: 320 mg/dL — ABNORMAL HIGH (ref 65–99)

## 2015-12-30 MED ORDER — SODIUM CHLORIDE 0.9 % IV BOLUS (SEPSIS)
1000.0000 mL | Freq: Once | INTRAVENOUS | Status: AC
  Administered 2015-12-30: 1000 mL via INTRAVENOUS

## 2015-12-30 MED ORDER — PROCHLORPERAZINE EDISYLATE 5 MG/ML IJ SOLN
10.0000 mg | Freq: Once | INTRAMUSCULAR | Status: AC
  Administered 2015-12-30: 10 mg via INTRAVENOUS

## 2015-12-30 MED ORDER — HYDROMORPHONE HCL 1 MG/ML IJ SOLN
0.5000 mg | Freq: Once | INTRAMUSCULAR | Status: AC
  Administered 2015-12-30: 0.5 mg via INTRAVENOUS
  Filled 2015-12-30: qty 1

## 2015-12-30 MED ORDER — ONDANSETRON HCL 4 MG/2ML IJ SOLN
4.0000 mg | Freq: Once | INTRAMUSCULAR | Status: AC
  Administered 2015-12-30: 4 mg via INTRAVENOUS

## 2015-12-30 MED ORDER — GADOBENATE DIMEGLUMINE 529 MG/ML IV SOLN
10.0000 mL | Freq: Once | INTRAVENOUS | Status: AC | PRN
  Administered 2015-12-31: 10 mL via INTRAVENOUS

## 2015-12-30 MED ORDER — MECLIZINE HCL 25 MG PO TABS
25.0000 mg | ORAL_TABLET | Freq: Once | ORAL | Status: AC
  Administered 2015-12-31: 25 mg via ORAL
  Filled 2015-12-30: qty 1

## 2015-12-30 MED ORDER — LORAZEPAM 2 MG/ML IJ SOLN
0.5000 mg | Freq: Once | INTRAMUSCULAR | Status: AC
  Administered 2015-12-31: 0.5 mg via INTRAVENOUS
  Filled 2015-12-30: qty 1

## 2015-12-30 MED ORDER — PROCHLORPERAZINE EDISYLATE 5 MG/ML IJ SOLN
INTRAMUSCULAR | Status: AC
  Administered 2015-12-30: 10 mg via INTRAVENOUS
  Filled 2015-12-30: qty 2

## 2015-12-30 MED ORDER — ONDANSETRON HCL 4 MG/2ML IJ SOLN
INTRAMUSCULAR | Status: AC
  Administered 2015-12-30: 4 mg via INTRAVENOUS
  Filled 2015-12-30: qty 2

## 2015-12-30 NOTE — ED Notes (Signed)
Patient transported to CT 

## 2015-12-30 NOTE — ED Notes (Signed)
Patient transported to MRI by tech

## 2015-12-30 NOTE — ED Provider Notes (Signed)
[facility] Emergency Department Provider Note   ____________________________________________   First MD Initiated Contact with Patient 12/30/15 2204     (approximate)  I have reviewed the triage vital signs and the nursing notes.   HISTORY  Chief Complaint Headache; Emesis; Dizziness; Hyperglycemia; and Abdominal Pain  HPI Karen [name] is a 61 y.o. female who reports she got sick this afternoon. She had gradual onset of a headache vomiting spinning when she moves and her blood sugar went up. She told the nurse she had nausea vomiting and diarrhea as well. Her belly is achy. She reports she feels very sick.   Past Medical History:  Diagnosis Date  . Alcoholic pancreatitis   . Depression   . Diabetes mellitus without complication ([place])   . DKA (diabetic ketoacidoses) ([place]) 02/17/2015  . Emphysematous cystitis   . Hypercholesteremia   . Hypertension   . Hypokalemia     Patient Active Problem List   Diagnosis Date Noted  . Abdominal pain   . Acute renal failure ([place])   . Nausea vomiting and diarrhea   . Smoker   . Poorly controlled type 2 diabetes mellitus ([place])   . Acute renal insufficiency 10/29/2015  . Elevated troponin 10/29/2015  . Urinary tract infection without hematuria 10/29/2015  . Left flank pain 10/29/2015  . Malignant essential hypertension 10/29/2015  . DKA (diabetic ketoacidoses) ([place]) 02/17/2015  . Emphysematous cystitis     Past Surgical History:  Procedure Laterality Date  . ABDOMINAL HYSTERECTOMY  1996  . APPENDECTOMY  1997  . HAND SURGERY  1988  . THYROID SURGERY  2013    Prior to Admission medications   Medication Sig Start Date End Date Taking? Authorizing Provider  amitriptyline (ELAVIL) 25 MG tablet Take 1 tablet (25 mg total) by mouth at bedtime. 11/01/15  Yes Srikar Sudini, MD  amLODipine (NORVASC) 10 MG tablet Take 1 tablet (10 mg total) by mouth daily. 12/13/15  Yes Demetrios Loll, MD    butalbital-acetaminophen-caffeine (FIORICET, ESGIC) [phone] MG tablet Take 2 tablets by mouth every 12 (twelve) hours as needed for headache. 11/01/15  Yes Srikar Sudini, MD  insulin aspart protamine - aspart (NOVOLOG MIX 70/30 FLEXPEN) (70-30) 100 UNIT/ML FlexPen Inject 15 Units into the skin 2 (two) times daily.   Yes Historical Provider, MD  lisinopril (PRINIVIL,ZESTRIL) 40 MG tablet Take 1 tablet (40 mg total) by mouth daily. 11/01/15  Yes Srikar Sudini, MD  metoCLOPramide (REGLAN) 5 MG tablet Take 1 tablet (5 mg total) by mouth 3 (three) times daily before meals. 11/01/15  Yes Srikar Sudini, MD  metoprolol (LOPRESSOR) 50 MG tablet Take 1 tablet (50 mg total) by mouth 2 (two) times daily. 12/12/15  Yes Demetrios Loll, MD  oxyCODONE-acetaminophen (PERCOCET) 5-325 MG tablet Take 2 tablets by mouth every 6 (six) hours as needed for moderate pain or severe pain. 05/14/15  Yes Earleen Newport, MD  pantoprazole (PROTONIX) 40 MG tablet Take 1 tablet (40 mg total) by mouth daily. 11/01/15  Yes Srikar Sudini, MD  ciprofloxacin (CIPRO) 500 MG tablet Take 1 tablet (500 mg total) by mouth 2 (two) times daily. Patient not taking: Reported on 12/30/2015 12/12/15   Demetrios Loll, MD    Allergies Penicillins; Reglan [metoclopramide]; and Percocet [oxycodone-acetaminophen]  Family History  Problem Relation Age of Onset  . Family history unknown: Yes    Social History Social History  Substance Use Topics  . Smoking status: Former Research scientist (life sciences)  . Smokeless tobacco: Never Used  .  Alcohol use Yes     Comment: daily    Review of Systems Constitutional: No fever/chills Eyes: No visual changes. ENT: No sore throat. Cardiovascular: Denies chest pain. Respiratory: Denies shortness of breath. Gastrointestinal: See history of present illness Genitourinary: Negative for dysuria. Musculoskeletal: Negative for back pain. Skin: Negative for rash. Neurological: Negative for  focal weakness or numbness.  10-point ROS  otherwise negative.  ____________________________________________   PHYSICAL EXAM:  VITAL SIGNS: ED Triage Vitals  Enc Vitals Group     BP 12/30/15 2200 (!) 197/94     Pulse Rate 12/30/15 2200 (!) 104     Resp 12/30/15 2200 (!) 23     Temp 12/30/15 2202 97.6 F (36.4 C)     Temp Source 12/30/15 2202 Oral     SpO2 12/30/15 2200 96 %     Weight 12/30/15 2203 110 lb (49.9 kg)     Height 12/30/15 2203 5\' 1"  (1.549 m)     Head Circumference --      Peak Flow --      Pain Score 12/30/15 2203 10     Pain Loc --      Pain Edu? --      Excl. in [place]? --     Constitutional: Alert and oriented.Looks sick. Vomits when she sits up. Eyes: Conjunctivae are normal. PERRL. EOMI. fundi is difficult to see on the right eye left eye fundus looks okay I do not see any nystagmus Head: Atraumatic. Nose: No congestion/rhinnorhea. Mouth/Throat: Mucous membranes are moist.  Oropharynx non-erythematous. Neck: No stridor. Cardiovascular: Normal rate, regular rhythm. Grossly normal heart sounds.  Good peripheral circulation. Respiratory: Normal respiratory effort.  No retractions. Lungs CTAB. Gastrointestinal: Soft and diffusely tender No distention. No abdominal bruits. No CVA tenderness. Musculoskeletal: No lower extremity tenderness nor edema.  No joint effusions. Neurologic:  Normal speech and language. Cranial nerves II through XII are intact cerebellar finger-nose is normal rapid alternating movements and fingers are normal motor strength is 5 over 5 throughout sensation is normal for her.   ____________________________________________   LABS (all labs ordered are listed, but only abnormal results are displayed)  Labs Reviewed  GLUCOSE, CAPILLARY - Abnormal; Notable for the following:       Result Value   Glucose-Capillary 320 (*)    All other components within normal limits  BASIC METABOLIC PANEL - Abnormal; Notable for the following:    CO2 21 (*)    Glucose, Bld 352 (*)    BUN 26 (*)      Creatinine, Ser 1.02 (*)    Calcium 8.8 (*)    GFR calc non Af Amer 59 (*)    All other components within normal limits  CBC - Abnormal; Notable for the following:    WBC 11.9 (*)    All other components within normal limits  GLUCOSE, CAPILLARY - Abnormal; Notable for the following:    Glucose-Capillary 346 (*)    All other components within normal limits  BLOOD GAS, VENOUS  INFLUENZA PANEL BY PCR (TYPE A & B, H1N1)  CBG MONITORING, ED  CBG MONITORING, ED   ____________________________________________  EKG   ____________________________________________  RADIOLOGY  Study Result   CLINICAL DATA:  Headache and dizziness  EXAM: CT HEAD WITHOUT CONTRAST  TECHNIQUE: Contiguous axial images were obtained from the base of the skull through the vertex without intravenous contrast.  COMPARISON:  Brain MRI 10/31/2015  FINDINGS: Brain: No mass lesion, intraparenchymal hemorrhage or extra-axial collection. No evidence of acute  cortical infarct. Brain parenchyma and CSF-containing spaces are normal for age.  Vascular: Atherosclerotic calcification of the internal carotid arteries at the skullbase.  Skull: Normal visualized skull base, calvarium and extracranial soft tissues.  Sinuses/Orbits: No sinus fluid levels or advanced mucosal thickening. No mastoid effusion. Normal orbits.  IMPRESSION: 1. Normal CT of the brain. 2. Internal carotid artery atherosclerosis.   Electronically Signed   By: Ulyses Jarred M.D.   On: 12/30/2015 22:30    ____________________________________________   PROCEDURES  Procedure(s) performed:   Procedures  Critical Care performed:   ____________________________________________   INITIAL IMPRESSION / ASSESSMENT AND PLAN / ED COURSE  Pertinent labs & imaging results that were available during my care of the patient were reviewed by me and considered in my medical decision making (see chart for  details).    Clinical Course    Patient still hypertensive and vertiginous. She has atherosclerosis neck carotids history of diabetes hypertension and high cholesterol. Await the results of the MRI before attempting to manage the hypertension in case she is having some cerebral ischemia. If we are unable to control her blood pressure or her other symptoms we may admit the patient Dr. Karma Greaser follow up the MRI report.  ____________________________________________   FINAL CLINICAL IMPRESSION(S) / ED DIAGNOSES  Final diagnoses:  Nausea vomiting and diarrhea  Vertigo      NEW MEDICATIONS STARTED DURING THIS VISIT:  New Prescriptions   No medications on file     Note:  This document was prepared using Dragon voice recognition software and may include unintentional dictation errors.    Nena Polio, MD 12/31/15 [phone]

## 2015-12-30 NOTE — ED Triage Notes (Signed)
Pt presents via ems from home with headache, dizziness, nausea vomiting, diarrhea, and abd pain since 2pm today. Abd distention. Tightness feeling to her. Pt HTN and DM. EMS reports BP 2220/111 CBG 351. Pt has taking all BP meds and DM meds today. Pt is alert and orient. EMS gave 238ml of NS and 4mg  of Zofran in route.

## 2015-12-31 ENCOUNTER — Encounter: Payer: Self-pay | Admitting: Radiology

## 2015-12-31 ENCOUNTER — Emergency Department: Payer: Medicaid Other

## 2015-12-31 DIAGNOSIS — E1310 Other specified diabetes mellitus with ketoacidosis without coma: Secondary | ICD-10-CM | POA: Diagnosis not present

## 2015-12-31 DIAGNOSIS — N289 Disorder of kidney and ureter, unspecified: Secondary | ICD-10-CM | POA: Diagnosis not present

## 2015-12-31 DIAGNOSIS — N3080 Other cystitis without hematuria: Secondary | ICD-10-CM | POA: Diagnosis not present

## 2015-12-31 LAB — CBC WITH DIFFERENTIAL/PLATELET
Basophils Absolute: 0 10*3/uL (ref 0–0.1)
Basophils Relative: 0 %
Eosinophils Absolute: 0 10*3/uL (ref 0–0.7)
Eosinophils Relative: 0 %
HEMATOCRIT: 32.1 % — AB (ref 35.0–47.0)
HEMOGLOBIN: 11.1 g/dL — AB (ref 12.0–16.0)
LYMPHS PCT: 15 %
Lymphs Abs: 1.8 10*3/uL (ref 1.0–3.6)
MCH: 31 pg (ref 26.0–34.0)
MCHC: 34.5 g/dL (ref 32.0–36.0)
MCV: 90.1 fL (ref 80.0–100.0)
MONO ABS: 0.9 10*3/uL (ref 0.2–0.9)
MONOS PCT: 8 %
NEUTROS ABS: 9 10*3/uL — AB (ref 1.4–6.5)
Neutrophils Relative %: 77 %
Platelets: 220 10*3/uL (ref 150–440)
RBC: 3.56 MIL/uL — ABNORMAL LOW (ref 3.80–5.20)
RDW: 12 % (ref 11.5–14.5)
WBC: 11.8 10*3/uL — ABNORMAL HIGH (ref 3.6–11.0)

## 2015-12-31 LAB — GLUCOSE, CAPILLARY
GLUCOSE-CAPILLARY: 84 mg/dL (ref 65–99)
GLUCOSE-CAPILLARY: 135 mg/dL — AB (ref 65–99)
GLUCOSE-CAPILLARY: 42 mg/dL — AB (ref 65–99)
GLUCOSE-CAPILLARY: 91 mg/dL (ref 65–99)
Glucose-Capillary: 305 mg/dL — ABNORMAL HIGH (ref 65–99)
Glucose-Capillary: 63 mg/dL — ABNORMAL LOW (ref 65–99)

## 2015-12-31 LAB — HEPATIC FUNCTION PANEL
ALBUMIN: 3.5 g/dL (ref 3.5–5.0)
ALT: 25 U/L (ref 14–54)
AST: 19 U/L (ref 15–41)
Alkaline Phosphatase: 154 U/L — ABNORMAL HIGH (ref 38–126)
BILIRUBIN TOTAL: 0.7 mg/dL (ref 0.3–1.2)
Total Protein: 7.3 g/dL (ref 6.5–8.1)

## 2015-12-31 LAB — URINALYSIS, COMPLETE (UACMP) WITH MICROSCOPIC
Bilirubin Urine: NEGATIVE
Ketones, ur: 5 mg/dL — AB
Nitrite: NEGATIVE
PH: 5 (ref 5.0–8.0)
Protein, ur: 100 mg/dL — AB
SPECIFIC GRAVITY, URINE: 1.036 — AB (ref 1.005–1.030)
Squamous Epithelial / LPF: NONE SEEN

## 2015-12-31 LAB — INFLUENZA PANEL BY PCR (TYPE A & B)
INFLAPCR: NEGATIVE
Influenza B By PCR: NEGATIVE

## 2015-12-31 LAB — CREATININE, SERUM
CREATININE: 0.89 mg/dL (ref 0.44–1.00)
GFR calc Af Amer: >60 mL/min (ref >60)

## 2015-12-31 LAB — LIPASE, BLOOD: LIPASE: 119 U/L — AB (ref 11–51)

## 2015-12-31 MED ORDER — SODIUM CHLORIDE 0.9 % IV SOLN
INTRAVENOUS | Status: DC
  Administered 2015-12-31 – 2016-01-01 (×3): via INTRAVENOUS

## 2015-12-31 MED ORDER — CIPROFLOXACIN IN D5W 200 MG/100ML IV SOLN: 200.0000 mg | Freq: Two times a day (BID) | INTRAVENOUS | Status: DC

## 2015-12-31 MED ORDER — CIPROFLOXACIN IN D5W 400 MG/200ML IV SOLN
400.0000 mg | Freq: Once | INTRAVENOUS | Status: AC
  Administered 2015-12-31: 400 mg via INTRAVENOUS
  Filled 2015-12-31: qty 200

## 2015-12-31 MED ORDER — AMITRIPTYLINE HCL 25 MG PO TABS
25.0000 mg | ORAL_TABLET | Freq: Every day | ORAL | Status: DC
  Administered 2015-12-31: 25 mg via ORAL
  Filled 2015-12-31 (×2): qty 1

## 2015-12-31 MED ORDER — ACETAMINOPHEN 650 MG RE SUPP: 650.0000 mg | Freq: Four times a day (QID) | RECTAL | Status: DC | PRN

## 2015-12-31 MED ORDER — INSULIN ASPART 100 UNIT/ML ~~LOC~~ SOLN
0.0000 [IU] | Freq: Three times a day (TID) | SUBCUTANEOUS | Status: DC
  Administered 2015-12-31: 1 [IU] via SUBCUTANEOUS
  Administered 2016-01-01: 2 [IU] via SUBCUTANEOUS
  Filled 2015-12-31: qty 2
  Filled 2015-12-31: qty 1

## 2015-12-31 MED ORDER — INSULIN ASPART 100 UNIT/ML ~~LOC~~ SOLN: 0.0000 [IU] | Freq: Every day | SUBCUTANEOUS | Status: DC

## 2015-12-31 MED ORDER — MORPHINE SULFATE (PF) 2 MG/ML IV SOLN
2.0000 mg | INTRAVENOUS | Status: DC | PRN
  Administered 2015-12-31: 2 mg via INTRAVENOUS

## 2015-12-31 MED ORDER — SENNOSIDES-DOCUSATE SODIUM 8.6-50 MG PO TABS: 1.0000 | ORAL_TABLET | Freq: Every evening | ORAL | Status: DC | PRN

## 2015-12-31 MED ORDER — BUTALBITAL-APAP-CAFFEINE 50-325-40 MG PO TABS: 2.0000 | ORAL_TABLET | Freq: Two times a day (BID) | ORAL | Status: DC | PRN

## 2015-12-31 MED ORDER — IOPAMIDOL (ISOVUE-300) INJECTION 61%
30.0000 mL | INTRAVENOUS | Status: AC
  Administered 2015-12-31: 15 mL via ORAL

## 2015-12-31 MED ORDER — CIPROFLOXACIN IN D5W 400 MG/200ML IV SOLN
400.0000 mg | Freq: Two times a day (BID) | INTRAVENOUS | Status: DC
  Filled 2015-12-31 (×3): qty 200

## 2015-12-31 MED ORDER — PNEUMOCOCCAL VAC POLYVALENT 25 MCG/0.5ML IJ INJ
0.5000 mL | INJECTION | INTRAMUSCULAR | Status: AC
  Administered 2016-01-01: 0.5 mL via INTRAMUSCULAR
  Filled 2015-12-31 (×2): qty 0.5

## 2015-12-31 MED ORDER — IOPAMIDOL (ISOVUE-300) INJECTION 61%
75.0000 mL | Freq: Once | INTRAVENOUS | Status: AC | PRN
  Administered 2015-12-31: 75 mL via INTRAVENOUS

## 2015-12-31 MED ORDER — ACETAMINOPHEN 325 MG PO TABS
650.0000 mg | ORAL_TABLET | Freq: Four times a day (QID) | ORAL | Status: DC | PRN
  Administered 2016-01-01: 650 mg via ORAL
  Filled 2015-12-31: qty 2

## 2015-12-31 MED ORDER — ONDANSETRON HCL 4 MG PO TABS: 4.0000 mg | ORAL_TABLET | Freq: Four times a day (QID) | ORAL | Status: DC | PRN

## 2015-12-31 MED ORDER — INSULIN ASPART PROT & ASPART (70-30 MIX) 100 UNIT/ML ~~LOC~~ SUSP
15.0000 [IU] | Freq: Two times a day (BID) | SUBCUTANEOUS | Status: DC
  Administered 2015-12-31 (×2): 15 [IU] via SUBCUTANEOUS
  Filled 2015-12-31 (×2): qty 15

## 2015-12-31 MED ORDER — PANTOPRAZOLE SODIUM 40 MG PO TBEC
40.0000 mg | DELAYED_RELEASE_TABLET | Freq: Every day | ORAL | Status: DC
  Administered 2015-12-31 – 2016-01-01 (×2): 40 mg via ORAL
  Filled 2015-12-31 (×2): qty 1

## 2015-12-31 MED ORDER — OXYCODONE-ACETAMINOPHEN 5-325 MG PO TABS
2.0000 | ORAL_TABLET | Freq: Four times a day (QID) | ORAL | Status: DC | PRN
  Administered 2015-12-31 – 2016-01-01 (×5): 2 via ORAL
  Filled 2015-12-31 (×5): qty 2

## 2015-12-31 MED ORDER — MORPHINE SULFATE (PF) 2 MG/ML IV SOLN
INTRAVENOUS | Status: AC
  Administered 2015-12-31: 2 mg via INTRAVENOUS
  Filled 2015-12-31: qty 1

## 2015-12-31 MED ORDER — SODIUM CHLORIDE 0.9% FLUSH
3.0000 mL | Freq: Two times a day (BID) | INTRAVENOUS | Status: DC
  Administered 2016-01-01: 3 mL via INTRAVENOUS

## 2015-12-31 MED ORDER — LISINOPRIL 20 MG PO TABS
40.0000 mg | ORAL_TABLET | Freq: Every day | ORAL | Status: DC
  Administered 2015-12-31 – 2016-01-01 (×2): 40 mg via ORAL
  Filled 2015-12-31 (×2): qty 2

## 2015-12-31 MED ORDER — ONDANSETRON HCL 4 MG/2ML IJ SOLN
4.0000 mg | Freq: Four times a day (QID) | INTRAMUSCULAR | Status: DC | PRN
  Administered 2015-12-31: 4 mg via INTRAVENOUS
  Filled 2015-12-31: qty 2

## 2015-12-31 MED ORDER — ENOXAPARIN SODIUM 40 MG/0.4ML ~~LOC~~ SOLN
40.0000 mg | SUBCUTANEOUS | Status: DC
  Administered 2015-12-31: 40 mg via SUBCUTANEOUS
  Filled 2015-12-31: qty 0.4

## 2015-12-31 MED ORDER — METOPROLOL TARTRATE 50 MG PO TABS
50.0000 mg | ORAL_TABLET | Freq: Every morning | ORAL | Status: DC
  Administered 2016-01-01: 50 mg via ORAL
  Filled 2015-12-31: qty 1

## 2015-12-31 MED ORDER — METOPROLOL TARTRATE 50 MG PO TABS
50.0000 mg | ORAL_TABLET | Freq: Two times a day (BID) | ORAL | Status: DC
  Administered 2015-12-31: 50 mg via ORAL
  Filled 2015-12-31 (×3): qty 1

## 2015-12-31 MED ORDER — AMLODIPINE BESYLATE 10 MG PO TABS
10.0000 mg | ORAL_TABLET | Freq: Every day | ORAL | Status: DC
  Administered 2015-12-31 – 2016-01-01 (×2): 10 mg via ORAL
  Filled 2015-12-31 (×2): qty 1

## 2015-12-31 NOTE — ED Notes (Signed)
MD ordering CT of Abd due continued nausea and abd pain. Will continue to monitor. Pt is sleeping at this time.

## 2015-12-31 NOTE — Progress Notes (Signed)
Patient still has abdominal pain. Vital signs stable. Physical examination is done. She has mild abdominal distention and tenderness. No rebound or rigidity. No CVA tenderness.  60 year old female patient with history of diabetes mellitus, pancreatitis, hyperlipidemia, hypertension presented to the emergency room with dizziness and abdominal pain.   1. Dizziness and headache. Improved. 2. Dehydration. continue IV fluid support. 3. Emphysematous cystitis. Continue Cipro. Follow-up urine culture. Follow-upUrology consult. 4. Uncontrolled diabetes mellitus. Continue NovoLog 70/30 insulin sliding scale. 5. Hyperlipidemia 6. Hypertension. Continue home hypertension medication.  Discussed with the nurse and social worker.

## 2015-12-31 NOTE — Progress Notes (Signed)
Inpatient Diabetes Program Recommendations  AACE/ADA: New Consensus Statement on Inpatient Glycemic Control (2015)  Target Ranges:  Prepandial:   less than 140 mg/dL      Peak postprandial:   less than 180 mg/dL (1-2 hours)      Critically ill patients:  140 - 180 mg/dL   Lab Results  Component Value Date   GLUCAP 305 (H) 12/31/2015   HGBA1C 9.7 (H) 10/29/2015    Review of Glycemic Control Results for Karen [name], Karen [name] (MRN [id number]) as of 12/31/2015 10:25  Ref. Range 12/30/2015 22:00 12/30/2015 23:05 12/31/2015 01:34  Glucose-Capillary Latest Ref Range: 65 - 99 mg/dL 320 (H) 346 (H) 305 (H)   Diabetes history: DM Outpatient Diabetes medications: 70/30 15 units bid Current orders for Inpatient glycemic control: 70/30 15 units bid  Inpatient Diabetes Program Recommendations:  Noted A1c decreased from 11.7 02/18/15 to 9.7 10/29/15.  Please consider: -Novolog correction scale sensitive tid+0-5 units hs  Thank you, Bethena Roys E. Kizzi Overbey, RN, MSN, CDE Inpatient Glycemic Control Team Team Pager [phone] (8am-5pm) 12/31/2015 10:27 AM

## 2015-12-31 NOTE — ED Notes (Signed)
Pt back from MRI nausea is alittle better still dizziness and pain 8/10 to head and abd. Pt getting meds now. MD made aware of status. Will continue to monitor.

## 2015-12-31 NOTE — Progress Notes (Signed)
Pt alert and sitting up. Pt spoke of numerous grandchildren and great grandchildren. CH offered prayer.   12/31/15 1020  Clinical Encounter Type  Visited With Patient  Visit Type Initial  Referral From Nurse  Spiritual Encounters  Spiritual Needs Prayer;Emotional  Stress Factors  Patient Stress Factors None identified

## 2015-12-31 NOTE — Consult Note (Signed)
Reason for Consult: Cystitis, Hypocontractile Bladder / Diabetic Cystopathy  Referring Physician: Hinda Kehr MD  Karen [name] is an 60 y.o. female.   HPI:   1 - Cystitis - long h/o recurrent UTI, one case emphesymatous cystitis. Poorly compliant diabetic as per below. UA in admission with bacteruria. No fevers / leukocytosis w/o mild cystitis CT w/o gas in wall on admit. UCX 12/26 pending and placed on empiric cipro.   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 No h/o spine injury or CVA but is severe diabetic with A1c's >10, recurrent DKA, and known neuropathy. CT on admit with large bladder (approx 1L). Catheter now placed. She was seen for similar problem 02/2015 but did not keep follow up for PVR checks.  PMH sig for benign thyroid surgery, benign hyst, appy, IDDM.  Today "Karen [name]" is seen in consultation for above. She is presently admitted for abd pain and HTN / elevated glucose. Her abd pain has not changed with catheter bladder drainage.     Past Medical History:  Diagnosis Date  . Alcoholic pancreatitis   . Asthma   . Depression   . Diabetes mellitus without complication ([place])   . DKA (diabetic ketoacidoses) ([place]) 02/17/2015  . Emphysematous cystitis   . Hypercholesteremia   . Hypertension   . Hypokalemia     Past Surgical History:  Procedure Laterality Date  . ABDOMINAL HYSTERECTOMY  1996  . APPENDECTOMY  1997  . HAND SURGERY  1988  . THYROID SURGERY  2013    Family History  Problem Relation Age of Onset  . Family history unknown: Yes    Social History:  reports that she has quit smoking. She has never used smokeless tobacco. She reports that she drinks alcohol. She reports that she does not use drugs.  Allergies:  Allergies  Allergen Reactions  . Penicillins Anaphylaxis    Has patient had a PCN reaction causing immediate rash, facial/tongue/throat swelling, SOB or lightheadedness with hypotension:  Yes Has patient had a PCN reaction causing severe rash involving mucus membranes or skin necrosis: No Has patient had a PCN reaction that required hospitalization No Has patient had a PCN reaction occurring within the last 10 years: No If all of the above answers are "NO", then may proceed with Cephalosporin use.  . Reglan [Metoclopramide] Other (See Comments)    Hypotension, shortness of breath  . Percocet [Oxycodone-Acetaminophen] Rash    Medications: I have reviewed the patient's current medications.  Results for orders placed or performed during the hospital encounter of 12/30/15 (from the past 48 hour(s))  Glucose, capillary     Status: Abnormal   Collection Time: 12/30/15 10:00 PM  Result Value Ref Range   Glucose-Capillary 320 (H) 65 - 99 mg/dL   Comment 1 Notify RN    Comment 2 Document in Chart    Comment 3 Call MD NNP PA CNM   Basic metabolic panel     Status: Abnormal   Collection Time: 12/30/15 10:01 PM  Result Value Ref Range   Sodium 138 135 - 145 mmol/L   Potassium 3.8 3.5 - 5.1 mmol/L   Chloride 102 101 - 111 mmol/L   CO2 21 (L) 22 - 32 mmol/L   Glucose, Bld 352 (H) 65 - 99 mg/dL   BUN 26 (H) 6 - 20 mg/dL   Creatinine, Ser 1.02 (H) 0.44 - 1.00 mg/dL   Calcium 8.8 (L) 8.9 - 10.3 mg/dL   GFR calc  non Af Amer 59 (L) >60 mL/min   GFR calc Af Amer >60 >60 mL/min    Comment: (NOTE) The eGFR has been calculated using the CKD EPI equation. This calculation has not been validated in all clinical situations. eGFR's persistently <60 mL/min signify possible Chronic Kidney Disease.    Anion gap 15 5 - 15  CBC     Status: Abnormal   Collection Time: 12/30/15 10:01 PM  Result Value Ref Range   WBC 11.9 (H) 3.6 - 11.0 K/uL   RBC 4.03 3.80 - 5.20 MIL/uL   Hemoglobin 12.5 12.0 - 16.0 g/dL   HCT 36.2 35.0 - 47.0 %   MCV 89.8 80.0 - 100.0 fL   MCH 31.1 26.0 - 34.0 pg   MCHC 34.7 32.0 - 36.0 g/dL   RDW 12.1 11.5 - 14.5 %   Platelets 231 150 - 440 K/uL  Glucose,  capillary     Status: Abnormal   Collection Time: 12/30/15 11:05 PM  Result Value Ref Range   Glucose-Capillary 346 (H) 65 - 99 mg/dL  Blood gas, venous     Status: None (Preliminary result)   Collection Time: 12/30/15 11:07 PM  Result Value Ref Range   FIO2 PENDING    Delivery systems PENDING    pH, Ven 7.35 7.250 - 7.430   pCO2, Ven 45 44.0 - 60.0 mmHg   pO2, Ven 38.0 32.0 - 45.0 mmHg   Bicarbonate 24.8 20.0 - 28.0 mmol/L   Acid-base deficit 1.0 0.0 - 2.0 mmol/L   O2 Saturation 68.8 %   Patient temperature 37.0    Collection site VEIN    Sample type VEIN   Influenza panel by PCR (type A & B, H1N1)     Status: None   Collection Time: 12/30/15 11:07 PM  Result Value Ref Range   Influenza A By PCR NEGATIVE NEGATIVE   Influenza B By PCR NEGATIVE NEGATIVE    Comment: (NOTE) The Xpert Xpress Flu assay is intended as an aid in the diagnosis of  influenza and should not be used as a sole basis for treatment.  This  assay is FDA approved for nasopharyngeal swab specimens only. Nasal  washings and aspirates are unacceptable for Xpert Xpress Flu testing.   Glucose, capillary     Status: Abnormal   Collection Time: 12/31/15  1:34 AM  Result Value Ref Range   Glucose-Capillary 305 (H) 65 - 99 mg/dL   Comment 1 Call MD NNP PA CNM   Urinalysis, Complete w Microscopic     Status: Abnormal   Collection Time: 12/31/15  5:44 AM  Result Value Ref Range   Color, Urine YELLOW (A) YELLOW   APPearance HAZY (A) CLEAR   Specific Gravity, Urine 1.036 (H) 1.005 - 1.030   pH 5.0 5.0 - 8.0   Glucose, UA >=500 (A) NEGATIVE mg/dL   Hgb urine dipstick SMALL (A) NEGATIVE   Bilirubin Urine NEGATIVE NEGATIVE   Ketones, ur 5 (A) NEGATIVE mg/dL   Protein, ur 100 (A) NEGATIVE mg/dL   Nitrite NEGATIVE NEGATIVE   Leukocytes, UA TRACE (A) NEGATIVE   RBC / HPF 0-5 0 - 5 RBC/hpf   WBC, UA 6-30 0 - 5 WBC/hpf   Bacteria, UA MANY (A) NONE SEEN   Squamous Epithelial / LPF NONE SEEN NONE SEEN   Mucous PRESENT    Hepatic function panel     Status: Abnormal   Collection Time: 12/31/15  8:05 AM  Result Value Ref Range   Total  Protein 7.3 6.5 - 8.1 g/dL   Albumin 3.5 3.5 - 5.0 g/dL   AST 19 15 - 41 U/L   ALT 25 14 - 54 U/L   Alkaline Phosphatase 154 (H) 38 - 126 U/L   Total Bilirubin 0.7 0.3 - 1.2 mg/dL   Bilirubin, Direct <0.1 (L) 0.1 - 0.5 mg/dL   Indirect Bilirubin NOT CALCULATED 0.3 - 0.9 mg/dL  Lipase, blood     Status: Abnormal   Collection Time: 12/31/15  8:05 AM  Result Value Ref Range   Lipase 119 (H) 11 - 51 U/L  CBC with Differential     Status: Abnormal   Collection Time: 12/31/15  8:05 AM  Result Value Ref Range   WBC 11.8 (H) 3.6 - 11.0 K/uL   RBC 3.56 (L) 3.80 - 5.20 MIL/uL   Hemoglobin 11.1 (L) 12.0 - 16.0 g/dL   HCT 32.1 (L) 35.0 - 47.0 %   MCV 90.1 80.0 - 100.0 fL   MCH 31.0 26.0 - 34.0 pg   MCHC 34.5 32.0 - 36.0 g/dL   RDW 12.0 11.5 - 14.5 %   Platelets 220 150 - 440 K/uL   Neutrophils Relative % 77 %   Neutro Abs 9.0 (H) 1.4 - 6.5 K/uL   Lymphocytes Relative 15 %   Lymphs Abs 1.8 1.0 - 3.6 K/uL   Monocytes Relative 8 %   Monocytes Absolute 0.9 0.2 - 0.9 K/uL   Eosinophils Relative 0 %   Eosinophils Absolute 0.0 0 - 0.7 K/uL   Basophils Relative 0 %   Basophils Absolute 0.0 0 - 0.1 K/uL  Creatinine, serum     Status: None   Collection Time: 12/31/15  8:05 AM  Result Value Ref Range   Creatinine, Ser 0.89 0.44 - 1.00 mg/dL   GFR calc non Af Amer >60 >60 mL/min   GFR calc Af Amer >60 >60 mL/min    Comment: (NOTE) The eGFR has been calculated using the CKD EPI equation. This calculation has not been validated in all clinical situations. eGFR's persistently <60 mL/min signify possible Chronic Kidney Disease.     Ct Head Wo Contrast  Result Date: 12/30/2015 CLINICAL DATA:  Headache and dizziness EXAM: CT HEAD WITHOUT CONTRAST TECHNIQUE: Contiguous axial images were obtained from the base of the skull through the vertex without intravenous contrast.  COMPARISON:  Brain MRI 10/31/2015 FINDINGS: Brain: No mass lesion, intraparenchymal hemorrhage or extra-axial collection. No evidence of acute cortical infarct. Brain parenchyma and CSF-containing spaces are normal for age. Vascular: Atherosclerotic calcification of the internal carotid arteries at the skullbase. Skull: Normal visualized skull base, calvarium and extracranial soft tissues. Sinuses/Orbits: No sinus fluid levels or advanced mucosal thickening. No mastoid effusion. Normal orbits. IMPRESSION: 1. Normal CT of the brain. 2. Internal carotid artery atherosclerosis. Electronically Signed   By: Ulyses Jarred M.D.   On: 12/30/2015 22:30   [name] Contrast  Result Date: 12/31/2015 CLINICAL DATA:  Headache, dizziness with nausea and vomiting. Elevated blood pressure. EXAM: MRI HEAD WITHOUT AND WITH CONTRAST TECHNIQUE: Multiplanar, multiecho pulse sequences of the brain and surrounding structures were obtained without and with intravenous contrast. CONTRAST:  44m MULTIHANCE GADOBENATE DIMEGLUMINE 529 MG/ML IV SOLN COMPARISON:  Brain MRI 10/31/2015 Head CT 12/30/2015 FINDINGS: Brain: No focal diffusion restriction to indicate acute infarct. No intraparenchymal hemorrhage. There is multifocal hyperintense T2-weighted signal within the periventricular white matter, most often seen in the setting of chronic microvascular ischemia. No mass lesion or midline shift.  No hydrocephalus or extra-axial fluid collection. The midline structures are normal. No age advanced or lobar predominant atrophy. Vascular: Major intracranial arterial and venous sinus flow voids are preserved. No evidence of chronic microhemorrhage or amyloid angiopathy. Skull and upper cervical spine: The visualized skull base, calvarium, upper cervical spine and extracranial soft tissues are normal. Sinuses/Orbits: No fluid levels or advanced mucosal thickening. No mastoid effusion. Normal orbits. IMPRESSION: Mild chronic microvascular  disease without acute intracranial abnormality. Electronically Signed   By: Ulyses Jarred M.D.   On: 12/31/2015 00:45   Ct Abdomen Pelvis W Contrast  Result Date: 12/31/2015 CLINICAL DATA:  Abdominal distention. History of pancreatitis. Nausea, vomiting and diarrhea. EXAM: CT ABDOMEN AND PELVIS WITH CONTRAST TECHNIQUE: Multidetector CT imaging of the abdomen and pelvis was performed using the standard protocol following bolus administration of intravenous contrast. CONTRAST:  45m ISOVUE-300 IOPAMIDOL (ISOVUE-300) INJECTION 61% COMPARISON:  CT abdomen pelvis 05/14/2015 FINDINGS: Lower chest: No pulmonary nodules. No visible pleural or pericardial effusion. Hepatobiliary: Normal hepatic size and contours without focal liver lesion. No perihepatic ascites. No intra- or extrahepatic biliary dilatation. Normal gallbladder. Pancreas: Normal pancreatic contours and enhancement. No peripancreatic fluid collection or pancreatic ductal dilatation. Spleen: Normal. Adrenals/Urinary Tract: Normal adrenal glands. No hydronephrosis or solid renal mass. The urinary bladder is massively distended and contains a large amount of gas. Stomach/Bowel: No abnormal bowel dilatation. No bowel wall thickening or adjacent fat stranding to indicate acute inflammation. No abdominal fluid collection. The appendix is surgically absent. Vascular/Lymphatic: There is atherosclerotic calcification of the non aneurysmal abdominal aorta. No abdominal or pelvic adenopathy. Reproductive: Status post hysterectomy.  Normal ovaries. Musculoskeletal: No lytic or blastic osseous lesion. Normal visualized extrathoracic and extraperitoneal soft tissues. Other: No contributory non-categorized findings. IMPRESSION: 1. Massively distended urinary bladder containing a large amount of gas. This appearance is unchanged compared to 05/14/2015 and 02/17/2015 and is again favored to indicate emphysematous cystitis in a patient with history of same. Other possible  causes for air within the bladder include a rectovesical or vaginovesical fistula or recent catheterization. 2. No other acute abnormality of the abdomen or pelvis. 3. Aortic atherosclerosis. Electronically Signed   By: KUlyses JarredM.D.   On: 12/31/2015 04:50    Review of Systems  Constitutional: Positive for malaise/fatigue. Negative for fever.  HENT: Negative.   Eyes: Negative.  Eye pain:    Respiratory: Negative.   Cardiovascular: Negative.   Gastrointestinal: Positive for abdominal pain.  Genitourinary: Negative for flank pain and hematuria.  Musculoskeletal: Negative.   Skin: Negative.   Neurological: Negative.   Endo/Heme/Allergies: Negative.   Psychiatric/Behavioral: Negative.    Blood pressure (!) 155/75, pulse 83, temperature 97.9 F (36.6 C), temperature source Oral, resp. rate 16, height _0  (1.549 m), weight 50.4 kg (111 lb 1.6 oz), SpO2 97 %. Physical Exam  Constitutional: She is oriented to person, place, and time. She appears well-developed.  Eyes: Pupils are equal, round, and reactive to light.  Neck: Normal range of motion.  Cardiovascular: Normal rate.   Respiratory: Effort normal.  GI: Soft.  No rebound / guarding.   Genitourinary:  Genitourinary Comments: No CVAT. No SP TTP.   Musculoskeletal: Normal range of motion.  Neurological: She is alert and oriented to person, place, and time.  Skin: Skin is warm.  Psychiatric: She has a normal mood and affect. Her behavior is normal.    Assessment/Plan:  1 - Cystitis - this is just simple cystitis at this point. Agree with current ABX pending  CX results, then likely DC home on 5-7 days total course therapy. Reinforced her diabetes with glycosuria and poorly contractile bladder contribute to her risk and reinforced glycemic control.   2 - Hypocontractile Bladder / Diabetic Cytopathy - This is irreversable and often progressive problem. Rec continue current catheter, then f/u office to discuss baseline  urodynamics and then self cath as most prudent means of management. Again reinforced importance of glycemic control.   Her abd pain does not appear to be related to her hypocontractile bladder by history or exam.   We will arrange GU follow up for office visit in 1-2 weeks at [facility] office. Will follow PRN while in house at this point. Please call with questions.    Tyjah Hai 12/31/2015, 11:14 AM

## 2015-12-31 NOTE — ED Notes (Signed)
Pt requesting pain meds nausea meds and meds for dizziness. MD made aware.

## 2015-12-31 NOTE — H&P (Addendum)
[facility] [place] NAME: Karen [name]    MR#:  [id number]  DATE OF BIRTH:  1955-06-21  DATE OF ADMISSION:  12/30/2015  PRIMARY CARE PHYSICIAN: Marden Noble, MD   REQUESTING/REFERRING PHYSICIAN:   CHIEF COMPLAINT:   Chief Complaint  Patient presents with  . Headache  . Emesis  . Dizziness  . Hyperglycemia  . Abdominal Pain    HISTORY OF PRESENT ILLNESS: Karen [name]  is a 60 y.o. female with a known history of Diabetes mellitus, hyperlipidemia, hypertension, bronchial asthma presented to the emergency room with headache, nausea and abdominal discomfort. Patient also had dizziness the last 1 day. The headache was throbbing in nature. Patient was worked up with MRI brain which did not show any acute intracranial abnormality. The abdominal pain was located in the suprapubic area and below the umbilicus and was sharp in nature 6 out of 10 on a scale of 1-10. Patient was evaluated with CT abdomen which showed emphysematous cystitis, Foley catheter was placed. No history of any fever or chills. Hospitalist service was consulted for further care of the patient.  PAST MEDICAL HISTORY:   Past Medical History:  Diagnosis Date  . Alcoholic pancreatitis   . Asthma   . Depression   . Diabetes mellitus without complication ([place])   . DKA (diabetic ketoacidoses) ([place]) 02/17/2015  . Emphysematous cystitis   . Hypercholesteremia   . Hypertension   . Hypokalemia     PAST SURGICAL HISTORY: Past Surgical History:  Procedure Laterality Date  . ABDOMINAL HYSTERECTOMY  1996  . APPENDECTOMY  1997  . HAND SURGERY  1988  . THYROID SURGERY  2013    SOCIAL HISTORY:  Social History  Substance Use Topics  . Smoking status: Former Research scientist (life sciences)  . Smokeless tobacco: Never Used  . Alcohol use Yes     Comment: daily    FAMILY HISTORY:  Family History  Problem Relation Age of Onset  . Family history unknown: Yes    DRUG ALLERGIES:  Allergies   Allergen Reactions  . Penicillins Anaphylaxis    Has patient had a PCN reaction causing immediate rash, facial/tongue/throat swelling, SOB or lightheadedness with hypotension: Yes Has patient had a PCN reaction causing severe rash involving mucus membranes or skin necrosis: No Has patient had a PCN reaction that required hospitalization No Has patient had a PCN reaction occurring within the last 10 years: No If all of the above answers are "NO", then may proceed with Cephalosporin use.  . Reglan [Metoclopramide] Other (See Comments)    Hypotension, shortness of breath  . Percocet [Oxycodone-Acetaminophen] Rash    REVIEW OF SYSTEMS:   CONSTITUTIONAL: No fever, fatigue or weakness.  EYES: No blurred or double vision.  EARS, NOSE, AND THROAT: No tinnitus or ear pain.  RESPIRATORY: No cough, shortness of breath, wheezing or hemoptysis.  CARDIOVASCULAR: No chest pain, orthopnea, edema.  GASTROINTESTINAL: Has nausea, vomiting, abdominal pain.  Has no diarrhea. GENITOURINARY: No dysuria, hematuria.  ENDOCRINE: No polyuria, nocturia,  HEMATOLOGY: No anemia, easy bruising or bleeding SKIN: No rash or lesion. MUSCULOSKELETAL: No joint pain or arthritis.   NEUROLOGIC: No tingling, numbness, weakness.  Has dizziness and vertigo PSYCHIATRY: No anxiety or depression.   MEDICATIONS AT HOME:  Prior to Admission medications   Medication Sig Start Date End Date Taking? Authorizing Provider  amitriptyline (ELAVIL) 25 MG tablet Take 1 tablet (25 mg total) by mouth at bedtime. 11/01/15  Yes Hillary Bow, MD  amLODipine (NORVASC) 10 MG tablet Take 1 tablet (10 mg total) by mouth daily. 12/13/15  Yes Demetrios Loll, MD  butalbital-acetaminophen-caffeine (FIORICET, ESGIC) [phone] MG tablet Take 2 tablets by mouth every 12 (twelve) hours as needed for headache. 11/01/15  Yes Srikar Sudini, MD  insulin aspart protamine - aspart (NOVOLOG MIX 70/30 FLEXPEN) (70-30) 100 UNIT/ML FlexPen Inject 15 Units into the  skin 2 (two) times daily.   Yes Historical Provider, MD  lisinopril (PRINIVIL,ZESTRIL) 40 MG tablet Take 1 tablet (40 mg total) by mouth daily. 11/01/15  Yes Srikar Sudini, MD  metoCLOPramide (REGLAN) 5 MG tablet Take 1 tablet (5 mg total) by mouth 3 (three) times daily before meals. 11/01/15  Yes Srikar Sudini, MD  metoprolol (LOPRESSOR) 50 MG tablet Take 1 tablet (50 mg total) by mouth 2 (two) times daily. 12/12/15  Yes Demetrios Loll, MD  oxyCODONE-acetaminophen (PERCOCET) 5-325 MG tablet Take 2 tablets by mouth every 6 (six) hours as needed for moderate pain or severe pain. 05/14/15  Yes Earleen Newport, MD  pantoprazole (PROTONIX) 40 MG tablet Take 1 tablet (40 mg total) by mouth daily. 11/01/15  Yes Srikar Sudini, MD  ciprofloxacin (CIPRO) 500 MG tablet Take 1 tablet (500 mg total) by mouth 2 (two) times daily. Patient not taking: Reported on 12/30/2015 12/12/15   Demetrios Loll, MD      PHYSICAL EXAMINATION:   VITAL SIGNS: Blood pressure (!) 168/83, pulse 84, temperature 97.5 F (36.4 C), temperature source Oral, resp. rate 16, height 5\' 1"  (1.549 m), weight 49.9 kg (110 lb), SpO2 100 %.  GENERAL:  60 y.o.-year-old patient lying in the bed with no acute distress.  EYES: Pupils equal, round, reactive to light and accommodation. No scleral icterus. Extraocular muscles intact.  HEENT: Head atraumatic, normocephalic. Oropharynx and nasopharynx clear.  NECK:  Supple, no jugular venous distention. No thyroid enlargement, no tenderness.  LUNGS: Normal breath sounds bilaterally, no wheezing, rales,rhonchi or crepitation. No use of accessory muscles of respiration.  CARDIOVASCULAR: S1, S2 normal. No murmurs, rubs, or gallops.  ABDOMEN: Soft, tenderness below umbilicus, distended. Bowel sounds present. No organomegaly or mass.  EXTREMITIES: No pedal edema, cyanosis, or clubbing.  NEUROLOGIC: Cranial nerves II through XII are intact. Muscle strength 5/5 in all extremities. Sensation intact. Gait not  checked.  PSYCHIATRIC: The patient is alert and oriented x 3.  SKIN: No obvious rash, lesion, or ulcer.   LABORATORY PANEL:   CBC  Recent Labs Lab 12/30/15 2201  WBC 11.9*  HGB 12.5  HCT 36.2  PLT 231  MCV 89.8  MCH 31.1  MCHC 34.7  RDW 12.1   ------------------------------------------------------------------------------------------------------------------  Chemistries   Recent Labs Lab 12/30/15 2201  NA 138  K 3.8  CL 102  CO2 21*  GLUCOSE 352*  BUN 26*  CREATININE 1.02*  CALCIUM 8.8*   ------------------------------------------------------------------------------------------------------------------ estimated creatinine clearance is 45.4 mL/min (by C-G formula based on SCr of 1.02 mg/dL (H)). ------------------------------------------------------------------------------------------------------------------ No results for input(s): TSH, T4TOTAL, T3FREE, THYROIDAB in the last 72 hours.  Invalid input(s): FREET3   Coagulation profile No results for input(s): INR, PROTIME in the last 168 hours. ------------------------------------------------------------------------------------------------------------------- No results for input(s): DDIMER in the last 72 hours. -------------------------------------------------------------------------------------------------------------------  Cardiac Enzymes No results for input(s): CKMB, TROPONINI, MYOGLOBIN in the last 168 hours.  Invalid input(s): CK ------------------------------------------------------------------------------------------------------------------ Invalid input(s): POCBNP  ---------------------------------------------------------------------------------------------------------------  Urinalysis    Component Value Date/Time   COLORURINE YELLOW (A) 12/31/2015 0544   APPEARANCEUR HAZY (A) 12/31/2015 0544   APPEARANCEUR Hazy 08/11/2013  0729   LABSPEC 1.036 (H) 12/31/2015 0544   LABSPEC 1.030 08/11/2013 0729    PHURINE 5.0 12/31/2015 0544   GLUCOSEU >=500 (A) 12/31/2015 0544   GLUCOSEU >=500 08/11/2013 0729   HGBUR SMALL (A) 12/31/2015 0544   BILIRUBINUR NEGATIVE 12/31/2015 0544   BILIRUBINUR Negative 08/11/2013 0729   KETONESUR 5 (A) 12/31/2015 0544   PROTEINUR 100 (A) 12/31/2015 0544   NITRITE NEGATIVE 12/31/2015 0544   LEUKOCYTESUR TRACE (A) 12/31/2015 0544   LEUKOCYTESUR Negative 08/11/2013 0729     RADIOLOGY: Ct Head Wo Contrast  Result Date: 12/30/2015 CLINICAL DATA:  Headache and dizziness EXAM: CT HEAD WITHOUT CONTRAST TECHNIQUE: Contiguous axial images were obtained from the base of the skull through the vertex without intravenous contrast. COMPARISON:  Brain MRI 10/31/2015 FINDINGS: Brain: No mass lesion, intraparenchymal hemorrhage or extra-axial collection. No evidence of acute cortical infarct. Brain parenchyma and CSF-containing spaces are normal for age. Vascular: Atherosclerotic calcification of the internal carotid arteries at the skullbase. Skull: Normal visualized skull base, calvarium and extracranial soft tissues. Sinuses/Orbits: No sinus fluid levels or advanced mucosal thickening. No mastoid effusion. Normal orbits. IMPRESSION: 1. Normal CT of the brain. 2. Internal carotid artery atherosclerosis. Electronically Signed   By: Ulyses Jarred M.D.   On: 12/30/2015 22:30   [name] Contrast  Result Date: 12/31/2015 CLINICAL DATA:  Headache, dizziness with nausea and vomiting. Elevated blood pressure. EXAM: MRI HEAD WITHOUT AND WITH CONTRAST TECHNIQUE: Multiplanar, multiecho pulse sequences of the brain and surrounding structures were obtained without and with intravenous contrast. CONTRAST:  37mL MULTIHANCE GADOBENATE DIMEGLUMINE 529 MG/ML IV SOLN COMPARISON:  Brain MRI 10/31/2015 Head CT 12/30/2015 FINDINGS: Brain: No focal diffusion restriction to indicate acute infarct. No intraparenchymal hemorrhage. There is multifocal hyperintense T2-weighted signal within the  periventricular white matter, most often seen in the setting of chronic microvascular ischemia. No mass lesion or midline shift. No hydrocephalus or extra-axial fluid collection. The midline structures are normal. No age advanced or lobar predominant atrophy. Vascular: Major intracranial arterial and venous sinus flow voids are preserved. No evidence of chronic microhemorrhage or amyloid angiopathy. Skull and upper cervical spine: The visualized skull base, calvarium, upper cervical spine and extracranial soft tissues are normal. Sinuses/Orbits: No fluid levels or advanced mucosal thickening. No mastoid effusion. Normal orbits. IMPRESSION: Mild chronic microvascular disease without acute intracranial abnormality. Electronically Signed   By: Ulyses Jarred M.D.   On: 12/31/2015 00:45   Ct Abdomen Pelvis W Contrast  Result Date: 12/31/2015 CLINICAL DATA:  Abdominal distention. History of pancreatitis. Nausea, vomiting and diarrhea. EXAM: CT ABDOMEN AND PELVIS WITH CONTRAST TECHNIQUE: Multidetector CT imaging of the abdomen and pelvis was performed using the standard protocol following bolus administration of intravenous contrast. CONTRAST:  30mL ISOVUE-300 IOPAMIDOL (ISOVUE-300) INJECTION 61% COMPARISON:  CT abdomen pelvis 05/14/2015 FINDINGS: Lower chest: No pulmonary nodules. No visible pleural or pericardial effusion. Hepatobiliary: Normal hepatic size and contours without focal liver lesion. No perihepatic ascites. No intra- or extrahepatic biliary dilatation. Normal gallbladder. Pancreas: Normal pancreatic contours and enhancement. No peripancreatic fluid collection or pancreatic ductal dilatation. Spleen: Normal. Adrenals/Urinary Tract: Normal adrenal glands. No hydronephrosis or solid renal mass. The urinary bladder is massively distended and contains a large amount of gas. Stomach/Bowel: No abnormal bowel dilatation. No bowel wall thickening or adjacent fat stranding to indicate acute inflammation. No  abdominal fluid collection. The appendix is surgically absent. Vascular/Lymphatic: There is atherosclerotic calcification of the non aneurysmal abdominal aorta. No abdominal or pelvic adenopathy.  Reproductive: Status post hysterectomy.  Normal ovaries. Musculoskeletal: No lytic or blastic osseous lesion. Normal visualized extrathoracic and extraperitoneal soft tissues. Other: No contributory non-categorized findings. IMPRESSION: 1. Massively distended urinary bladder containing a large amount of gas. This appearance is unchanged compared to 05/14/2015 and 02/17/2015 and is again favored to indicate emphysematous cystitis in a patient with history of same. Other possible causes for air within the bladder include a rectovesical or vaginovesical fistula or recent catheterization. 2. No other acute abnormality of the abdomen or pelvis. 3. Aortic atherosclerosis. Electronically Signed   By: Ulyses Jarred M.D.   On: 12/31/2015 04:50    EKG: Orders placed or performed during the hospital encounter of 12/30/15  . ED EKG  . ED EKG  . EKG 12-Lead  . EKG 12-Lead    IMPRESSION AND PLAN: 60 year old female patient with history of diabetes mellitus, pancreatitis, hyperlipidemia, hypertension presented to the emergency room with dizziness and abdominal pain. Admitting diagnosis 1. Dizziness and giddiness 2. Dehydration 3. Emphysematous cystitis 4. Uncontrolled diabetes mellitus 5. Hyperlipidemia 6. Hypertension Treatment plan Admit patient to Medical floor IV fluid hydration Control blood sugars Foley catheterization Urology consult (Informed) and start patient on IV ciprofloxacin antibiotic. Supportive care  All the records are reviewed and case discussed with ED provider. Management plans discussed with the patient, family and they are in agreement.  CODE STATUS:FULL CODE Code Status History    Date Active Date Inactive Code Status Order ID Comments User Context   12/10/2015 11:09 AM 12/12/2015   6:48 PM Full Code [id number]  Demetrios Loll, MD Inpatient   10/29/2015  3:45 PM 11/01/2015  4:23 PM Full Code [id number]  Theodoro Grist, MD Inpatient   02/17/2015  5:04 PM 02/19/2015  6:04 PM Full Code [id number]  Nicholes Mango, MD Inpatient       TOTAL TIME TAKING CARE OF THIS PATIENT: 51 minutes.    Saundra Shelling M.D on 12/31/2015 at 6:34 AM  Between 7am to 6pm - Pager - [phone]  After 6pm go to [email or web address] - password [email or web address]  Office  [phone]  CC: Primary care physician; Marden Noble, MD

## 2015-12-31 NOTE — ED Notes (Signed)
Tech transporting pt to room 224

## 2015-12-31 NOTE — ED Notes (Signed)
Pt still in MRI 

## 2015-12-31 NOTE — ED Provider Notes (Signed)
----------------------------------------- 12:10 AM on 12/31/2015 -----------------------------------------   Blood pressure (!) 195/94, pulse (!) 104, temperature 97.6 F (36.4 C), temperature source Oral, resp. rate (!) 22, height 5\' 1"  (1.549 m), weight 49.9 kg, SpO2 98 %.  Assuming care from Dr. Cinda Quest.  In short, Karen [name] is a 60 y.o. female with a chief complaint of Headache; Emesis; Dizziness; Hyperglycemia; and Abdominal Pain .  Refer to the original H&P for additional details.  The current plan of care is to follow up MRI results given patient's presentation of vertigo-like symptoms and hypertension.  Holding on BP control until after we rule out CVA.    ----------------------------------------- 1:49 AM on 12/31/2015 -----------------------------------------  Blood pressure 108/63, pulse 80, temperature 97.6 F (36.4 C), temperature source Oral, resp. rate 18, height 5\' 1"  (1.549 m), weight 49.9 kg, SpO2 94 %.   Mr Karen [name] And Wo Contrast Result Date: 12/31/2015 CLINICAL DATA:  Headache, dizziness with nausea and vomiting. Elevated blood pressure. EXAM: MRI HEAD WITHOUT AND WITH CONTRAST TECHNIQUE: Multiplanar, multiecho pulse sequences of the brain and surrounding structures were obtained without and with intravenous contrast. CONTRAST:  65mL MULTIHANCE GADOBENATE DIMEGLUMINE 529 MG/ML IV SOLN COMPARISON:  Brain MRI 10/31/2015 Head CT 12/30/2015 FINDINGS: Brain: No focal diffusion restriction to indicate acute infarct. No intraparenchymal hemorrhage. There is multifocal hyperintense T2-weighted signal within the periventricular white matter, most often seen in the setting of chronic microvascular ischemia. No mass lesion or midline shift. No hydrocephalus or extra-axial fluid collection. The midline structures are normal. No age advanced or lobar predominant atrophy. Vascular: Major intracranial arterial and venous sinus flow voids are preserved. No evidence of chronic  microhemorrhage or amyloid angiopathy. Skull and upper cervical spine: The visualized skull base, calvarium, upper cervical spine and extracranial soft tissues are normal. Sinuses/Orbits: No fluid levels or advanced mucosal thickening. No mastoid effusion. Normal orbits. IMPRESSION: Mild chronic microvascular disease without acute intracranial abnormality. Electronically Signed   By: Ulyses Jarred M.D.   On: 12/31/2015 00:45    The patient's MR brain was unremarkable with no evidence of acute abnormality.  The patient's blood pressure has come down to normal range and she is asleep.  She awoke to light voice and reports that she still feels "bad" citing persistent headache and persistent abdominal pains that are occasionally sharp and a feeling of being "tight".  Her abdomen does appear somewhat distended with some mild tenderness to palpation throughout.    Will proceed with CT abd/pelvis for further evaluation.  ----------------------------------------- 5:36 AM on 12/31/2015 -----------------------------------------  Emphysematous cystitis with massively dilated urinary bladder on CT scan.  The patient continues to feel ill with symptoms including abdominal pain, nausea, vomiting, and the vertigo which was evaluated by MR.  Given her poor state of health, persistent symptoms, and massively dilated bladder I will place a Foley catheter and admit her for further management.  I reviewed her prior medical records and see that she has been evaluated previously by urology.  At the time she was seen previously with the emphysematous cystitis, it was recommended that a Foley be placed for at least a week to decompress given the possibility of perforation due to over dilation and that she be placed on IV antibiotics.  Prior sensitivities indicate Escherichia coli infection sensitive to penicillins, cephalosporins, and Cipro.  Since she has a anaphylactic allergy to penicillins, I will place her on Cipro 400 mg IV.   I discussed with the hospitalist who asked that I please call urology  so I have paged urology to confirm the current plan of care.   Hinda Kehr, MD 12/31/15 [phone]

## 2015-12-31 NOTE — ED Notes (Signed)
Patient transported to CT pt only could tolerate 1 bottle of contrast.

## 2015-12-31 NOTE — ED Notes (Signed)
Admitting MD at the bedside. Made aware of headache, Dizziness, and Abd pain.

## 2015-12-31 NOTE — ED Notes (Signed)
Pt tolerated diet gingerale well. 

## 2015-12-31 NOTE — ED Notes (Signed)
Pt is sleeping once awaken states still having nausea. Drinking water. Pt states pain to heada and abd 6/10. Fluids completed. No dizziness at this time. CBG 305

## 2016-01-01 LAB — BASIC METABOLIC PANEL
ANION GAP: 5 (ref 5–15)
BUN: 16 mg/dL (ref 6–20)
CALCIUM: 8.5 mg/dL — AB (ref 8.9–10.3)
CO2: 28 mmol/L (ref 22–32)
Chloride: 108 mmol/L (ref 101–111)
Creatinine, Ser: 0.99 mg/dL (ref 0.44–1.00)
GFR calc non Af Amer: >60 mL/min (ref >60)
GLUCOSE: 139 mg/dL — AB (ref 65–99)
POTASSIUM: 3.7 mmol/L (ref 3.5–5.1)
Sodium: 141 mmol/L (ref 135–145)

## 2016-01-01 LAB — CBC
HEMATOCRIT: 32.9 % — AB (ref 35.0–47.0)
HEMOGLOBIN: 11.2 g/dL — AB (ref 12.0–16.0)
MCH: 31.3 pg (ref 26.0–34.0)
MCHC: 34 g/dL (ref 32.0–36.0)
MCV: 92 fL (ref 80.0–100.0)
Platelets: 220 10*3/uL (ref 150–440)
RBC: 3.58 MIL/uL — AB (ref 3.80–5.20)
RDW: 12.1 % (ref 11.5–14.5)
WBC: 11.4 10*3/uL — ABNORMAL HIGH (ref 3.6–11.0)

## 2016-01-01 LAB — GLUCOSE, CAPILLARY
GLUCOSE-CAPILLARY: 99 mg/dL (ref 65–99)
GLUCOSE-CAPILLARY: 175 mg/dL — AB (ref 65–99)

## 2016-01-01 MED ORDER — CIPROFLOXACIN HCL 500 MG PO TABS
500.0000 mg | ORAL_TABLET | Freq: Two times a day (BID) | ORAL | Status: DC
  Administered 2016-01-01: 500 mg via ORAL
  Filled 2016-01-01: qty 1

## 2016-01-01 MED ORDER — CIPROFLOXACIN HCL 500 MG PO TABS: 500.0000 mg | ORAL_TABLET | Freq: Two times a day (BID) | ORAL | 0 refills | Status: DC

## 2016-01-01 MED ORDER — LOPERAMIDE HCL 2 MG PO CAPS
2.0000 mg | ORAL_CAPSULE | Freq: Four times a day (QID) | ORAL | Status: DC | PRN
  Administered 2016-01-01: 2 mg via ORAL
  Filled 2016-01-01: qty 1

## 2016-01-01 NOTE — Discharge Summary (Signed)
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 Dehydration. SECONDARY DIAGNOSIS:   Past Medical History:  Diagnosis Date  . Alcoholic pancreatitis   . Asthma   . Depression   . Diabetes mellitus without complication ([place])   . DKA (diabetic ketoacidoses) ([place]) 02/17/2015  . Emphysematous cystitis   . Hypercholesteremia   . Hypertension   . Hypokalemia    HOSPITAL COURSE:  60 year old female patient with history of diabetes mellitus, pancreatitis, hyperlipidemia, hypertension presented to the emergency room with dizziness and abdominal pain.  1.Dizziness and headache. Improved. 2.Dehydration. Improved with IV fluid support. 3.Emphysematous cystitis. Continue Cipro for 5 more days. Urine culture: E Coli. Follow-upUrology as outpatient. 4. diabetes mellitus. controlled with NovoLog 70/30 and insulin sliding scale. 5.Hyperlipidemia 6.Hypertension. Continue home hypertension medication.  DISCHARGE CONDITIONS:  Stable, discharge to home today. CONSULTS OBTAINED:  Treatment Team:  Karen Frock, MD Karen Gustin, MD DRUG ALLERGIES:   Allergies  Allergen Reactions  . Penicillins Anaphylaxis    Has patient had a PCN reaction causing immediate rash, facial/tongue/throat swelling, SOB or lightheadedness with hypotension: Yes Has patient had a PCN reaction causing severe rash involving mucus membranes or skin necrosis: No Has patient had a PCN reaction  that required hospitalization No Has patient had a PCN reaction occurring within the last 10 years: No If all of the above answers are "NO", then may proceed with Cephalosporin use.  . Reglan [Metoclopramide] Other (See Comments)    Hypotension, shortness of breath  . Percocet [Oxycodone-Acetaminophen] Rash   DISCHARGE MEDICATIONS:   Allergies as of 01/01/2016      Reactions   Penicillins Anaphylaxis   Has patient had a PCN reaction causing immediate rash, facial/tongue/throat swelling, SOB or lightheadedness with hypotension: Yes Has patient had a PCN reaction causing severe rash involving mucus membranes or skin necrosis: No Has patient had a PCN reaction that required hospitalization No Has patient had a PCN reaction occurring within the last 10 years: No If all of the above answers are "NO", then may proceed with Cephalosporin use.   Reglan [metoclopramide] Other (See Comments)   Hypotension, shortness of breath   Percocet [oxycodone-acetaminophen] Rash      Medication List    TAKE these medications   amitriptyline 25 MG tablet Commonly known as:  ELAVIL Take 1 tablet (25 mg total) by mouth at bedtime.   amLODipine 10 MG tablet Commonly known as:  NORVASC Take 1 tablet (10 mg total) by mouth daily.   butalbital-acetaminophen-caffeine 50-325-40 MG tablet Commonly known as:  FIORICET, ESGIC Take 2 tablets by mouth every 12 (twelve) hours as needed for headache.   ciprofloxacin 500 MG tablet Commonly known as:  CIPRO Take 1 tablet (500 mg total) by mouth 2 (two) times daily.   lisinopril 40 MG tablet Commonly known as:  PRINIVIL,ZESTRIL Take 1 tablet (40 mg total) by mouth daily.   metoCLOPramide 5 MG tablet Commonly known as:  REGLAN Take 1 tablet (5 mg total) by mouth 3 (three) times daily before meals.  metoprolol 50 MG tablet Commonly known as:  LOPRESSOR Take 1 tablet (50 mg total) by mouth 2 (two) times daily.   NOVOLOG MIX 70/30 FLEXPEN (70-30) 100  UNIT/ML FlexPen Generic drug:  insulin aspart protamine - aspart Inject 15 Units into the skin 2 (two) times daily.   oxyCODONE-acetaminophen 5-325 MG tablet Commonly known as:  PERCOCET Take 2 tablets by mouth every 6 (six) hours as needed for moderate pain or severe pain.   pantoprazole 40 MG tablet Commonly known as:  PROTONIX Take 1 tablet (40 mg total) by mouth daily.        DISCHARGE INSTRUCTIONS:  See AVS.  If you experience worsening of your admission symptoms, develop shortness of breath, life threatening emergency, suicidal or homicidal thoughts you must seek medical attention immediately by calling 911 or calling your MD immediately  if symptoms less severe.  You Must read complete instructions/literature along with all the possible adverse reactions/side effects for all the Medicines you take and that have been prescribed to you. Take any new Medicines after you have completely understood and accpet all the possible adverse reactions/side effects.   Please note  You were cared for by a hospitalist during your hospital stay. If you have any questions about your discharge medications or the care you received while you were in the hospital after you are discharged, you can call the unit and asked to speak with the hospitalist on call if the hospitalist that took care of you is not available. Once you are discharged, your primary care physician will handle any further medical issues. Please note that NO REFILLS for any discharge medications will be authorized once you are discharged, as it is imperative that you return to your primary care physician (or establish a relationship with a primary care physician if you do not have one) for your aftercare needs so that they can reassess your need for medications and monitor your lab values.    On the day of Discharge:  VITAL SIGNS:  Blood pressure (!) 167/74, pulse 84, temperature 98.1 F (36.7 C), temperature source Oral, resp.  rate 17, height 5\' 1"  (1.549 m), weight 111 lb 1.6 oz (50.4 kg), SpO2 100 %. PHYSICAL EXAMINATION:  GENERAL:  60 y.o.-year-old patient lying in the bed with no acute distress.  EYES: Pupils equal, round, reactive to light and accommodation. No scleral icterus. Extraocular muscles intact.  HEENT: Head atraumatic, normocephalic. Oropharynx and nasopharynx clear.  NECK:  Supple, no jugular venous distention. No thyroid enlargement, no tenderness.  LUNGS: Normal breath sounds bilaterally, no wheezing, rales,rhonchi or crepitation. No use of accessory muscles of respiration.  CARDIOVASCULAR: S1, S2 normal. No murmurs, rubs, or gallops.  ABDOMEN: Soft, tenderness, non-distended. Bowel sounds present. No organomegaly or mass.  EXTREMITIES: No pedal edema, cyanosis, or clubbing.  NEUROLOGIC: Cranial nerves II through XII are intact. Muscle strength 5/5 in all extremities. Sensation intact. Gait not checked.  PSYCHIATRIC: The patient is alert and oriented x 3.  SKIN: No obvious rash, lesion, or ulcer.  DATA REVIEW:   CBC  Recent Labs Lab 01/01/16 0518  WBC 11.4*  HGB 11.2*  HCT 32.9*  PLT 220    Chemistries   Recent Labs Lab 12/31/15 0805 01/01/16 0518  NA  --  141  K  --  3.7  CL  --  108  CO2  --  28  GLUCOSE  --  139*  BUN  --  16  CREATININE 0.89 0.99  CALCIUM  --  8.5*  AST 19  --   ALT 25  --   ALKPHOS 154*  --   BILITOT 0.7  --      Microbiology Results  Results for orders placed or performed during the hospital encounter of 12/30/15  Urine culture     Status: Abnormal (Preliminary result)   Collection Time: 12/31/15  5:44 AM  Result Value Ref Range Status   Specimen Description URINE, CATHETERIZED  Final   Special Requests Normal  Final   Culture (A)  Final    >=100,000 COLONIES/mL ESCHERICHIA COLI SUSCEPTIBILITIES TO FOLLOW Performed at [facility]    Report Status PENDING  Incomplete    RADIOLOGY:  No results found.   Management plans  discussed with the patient, family and they are in agreement.  CODE STATUS:     Code Status Orders        Start     Ordered   12/31/15 0740  Full code  Continuous     12/31/15 0739    Code Status History    Date Active Date Inactive Code Status Order ID Comments User Context   12/10/2015 11:09 AM 12/12/2015  6:48 PM Full Code [id number]  Demetrios Loll, MD Inpatient   10/29/2015  3:45 PM 11/01/2015  4:23 PM Full Code [id number]  Theodoro Grist, MD Inpatient   02/17/2015  5:04 PM 02/19/2015  6:04 PM Full Code [id number]  Nicholes Mango, MD Inpatient      TOTAL TIME TAKING CARE OF THIS PATIENT: 28 minutes.    Demetrios Loll M.D on 01/01/2016 at 11:35 AM  Between 7am to 6pm - Pager - [phone]  After 6pm go to [email or web address] - Proofreader  [facility] [place] [facility]  Office  [phone]  CC: Primary care physician; Karen Noble, MD   Note: This dictation was prepared with Dragon dictation along with smaller phrase technology. Any transcriptional errors that result from this process are unintentional.

## 2016-01-01 NOTE — Progress Notes (Signed)
Dishaarged patient home  with friend, discharge instructions reviewed with patient including medications and at home foley care and follow up appointments.  Patient was in stable condition at time of discharge

## 2016-01-01 NOTE — Discharge Instructions (Signed)
Keep Foley cath until see urologist. Heart healthy and ADA diet.

## 2016-01-02 LAB — URINE CULTURE
Culture: >=100000 — AB
Special Requests: NORMAL

## 2016-01-04 LAB — BLOOD GAS, VENOUS
Acid-base deficit: 1 mmol/L (ref 0.0–2.0)
Bicarbonate: 24.8 mmol/L (ref 20.0–28.0)
O2 Saturation: 68.8 %
PCO2 VEN: 45 mmHg (ref 44.0–60.0)
PH VEN: 7.35 (ref 7.250–7.430)
Patient temperature: 37
pO2, Ven: 38 mmHg (ref 32.0–45.0)

## 2016-01-22 ENCOUNTER — Ambulatory Visit: Payer: Medicaid Other

## 2016-04-20 ENCOUNTER — Encounter: Payer: Self-pay | Admitting: *Deleted

## 2016-04-20 ENCOUNTER — Emergency Department: Payer: Medicaid Other

## 2016-04-20 ENCOUNTER — Observation Stay
Admission: EM | Admit: 2016-04-20 | Discharge: 2016-04-24 | Disposition: A | Discharge status: Home / Self Care | Payer: Medicaid Other | Attending: Specialist | Admitting: Specialist

## 2016-04-20 DIAGNOSIS — K210 Gastro-esophageal reflux disease with esophagitis: Secondary | ICD-10-CM | POA: Insufficient documentation

## 2016-04-20 DIAGNOSIS — E11649 Type 2 diabetes mellitus with hypoglycemia without coma: Secondary | ICD-10-CM | POA: Diagnosis not present

## 2016-04-20 DIAGNOSIS — E7800 Pure hypercholesterolemia, unspecified: Secondary | ICD-10-CM | POA: Diagnosis not present

## 2016-04-20 DIAGNOSIS — R112 Nausea with vomiting, unspecified: Secondary | ICD-10-CM

## 2016-04-20 DIAGNOSIS — Z87891 Personal history of nicotine dependence: Secondary | ICD-10-CM | POA: Diagnosis not present

## 2016-04-20 DIAGNOSIS — I10 Essential (primary) hypertension: Secondary | ICD-10-CM | POA: Diagnosis not present

## 2016-04-20 DIAGNOSIS — Z79899 Other long term (current) drug therapy: Secondary | ICD-10-CM | POA: Insufficient documentation

## 2016-04-20 DIAGNOSIS — E860 Dehydration: Secondary | ICD-10-CM | POA: Insufficient documentation

## 2016-04-20 DIAGNOSIS — I160 Hypertensive urgency: Principal | ICD-10-CM | POA: Diagnosis present

## 2016-04-20 DIAGNOSIS — K861 Other chronic pancreatitis: Secondary | ICD-10-CM | POA: Diagnosis not present

## 2016-04-20 DIAGNOSIS — G43909 Migraine, unspecified, not intractable, without status migrainosus: Secondary | ICD-10-CM | POA: Diagnosis not present

## 2016-04-20 DIAGNOSIS — Z794 Long term (current) use of insulin: Secondary | ICD-10-CM | POA: Insufficient documentation

## 2016-04-20 DIAGNOSIS — R51 Headache: Secondary | ICD-10-CM

## 2016-04-20 DIAGNOSIS — K760 Fatty (change of) liver, not elsewhere classified: Secondary | ICD-10-CM | POA: Diagnosis not present

## 2016-04-20 DIAGNOSIS — R519 Headache, unspecified: Secondary | ICD-10-CM

## 2016-04-20 DIAGNOSIS — R42 Dizziness and giddiness: Secondary | ICD-10-CM

## 2016-04-20 DIAGNOSIS — F329 Major depressive disorder, single episode, unspecified: Secondary | ICD-10-CM | POA: Diagnosis not present

## 2016-04-20 DIAGNOSIS — B3781 Candidal esophagitis: Secondary | ICD-10-CM

## 2016-04-20 DIAGNOSIS — Z9119 Patient's noncompliance with other medical treatment and regimen: Secondary | ICD-10-CM | POA: Diagnosis not present

## 2016-04-20 DIAGNOSIS — I639 Cerebral infarction, unspecified: Secondary | ICD-10-CM

## 2016-04-20 DIAGNOSIS — K2970 Gastritis, unspecified, without bleeding: Secondary | ICD-10-CM | POA: Diagnosis not present

## 2016-04-20 DIAGNOSIS — R1110 Vomiting, unspecified: Secondary | ICD-10-CM | POA: Diagnosis present

## 2016-04-20 LAB — CBC WITH DIFFERENTIAL/PLATELET
BASOS PCT: 0 %
Basophils Absolute: 0 10*3/uL (ref 0–0.1)
EOS ABS: 0.1 10*3/uL (ref 0–0.7)
EOS PCT: 1 %
HEMATOCRIT: 34.7 % — AB (ref 35.0–47.0)
HEMOGLOBIN: 12.1 g/dL (ref 12.0–16.0)
LYMPHS ABS: 1.2 10*3/uL (ref 1.0–3.6)
Lymphocytes Relative: 13 %
MCH: 30.9 pg (ref 26.0–34.0)
MCHC: 34.9 g/dL (ref 32.0–36.0)
MCV: 88.5 fL (ref 80.0–100.0)
MONO ABS: 0.4 10*3/uL (ref 0.2–0.9)
MONOS PCT: 4 %
NEUTROS PCT: 82 %
Neutro Abs: 7.2 10*3/uL — ABNORMAL HIGH (ref 1.4–6.5)
Platelets: 196 10*3/uL (ref 150–440)
RBC: 3.92 MIL/uL (ref 3.80–5.20)
RDW: 12.6 % (ref 11.5–14.5)
WBC: 8.9 10*3/uL (ref 3.6–11.0)

## 2016-04-20 LAB — GLUCOSE, CAPILLARY: Glucose-Capillary: 354 mg/dL — ABNORMAL HIGH (ref 65–99)

## 2016-04-20 LAB — COMPREHENSIVE METABOLIC PANEL
ALBUMIN: 3.9 g/dL (ref 3.5–5.0)
ALK PHOS: 176 U/L — AB (ref 38–126)
ALT: 32 U/L (ref 14–54)
AST: 29 U/L (ref 15–41)
Anion gap: 11 (ref 5–15)
BUN: 22 mg/dL — AB (ref 6–20)
CHLORIDE: 99 mmol/L — AB (ref 101–111)
CO2: 25 mmol/L (ref 22–32)
CREATININE: 0.97 mg/dL (ref 0.44–1.00)
Calcium: 8.9 mg/dL (ref 8.9–10.3)
GFR calc non Af Amer: >60 mL/min (ref >60)
GLUCOSE: 335 mg/dL — AB (ref 65–99)
Potassium: 3.7 mmol/L (ref 3.5–5.1)
SODIUM: 135 mmol/L (ref 135–145)
Total Bilirubin: 0.7 mg/dL (ref 0.3–1.2)
Total Protein: 8.4 g/dL — ABNORMAL HIGH (ref 6.5–8.1)

## 2016-04-20 LAB — TROPONIN I: TROPONIN I: 0.03 ng/mL — AB (ref <0.03)

## 2016-04-20 MED ORDER — LABETALOL HCL 5 MG/ML IV SOLN
5.0000 mg | Freq: Once | INTRAVENOUS | Status: AC
  Administered 2016-04-20: 5 mg via INTRAVENOUS
  Filled 2016-04-20: qty 4

## 2016-04-20 MED ORDER — ONDANSETRON HCL 4 MG/2ML IJ SOLN
4.0000 mg | Freq: Once | INTRAMUSCULAR | Status: AC
  Administered 2016-04-20: 4 mg via INTRAVENOUS
  Filled 2016-04-20: qty 2

## 2016-04-20 MED ORDER — SODIUM CHLORIDE 0.9 % IV BOLUS (SEPSIS)
1000.0000 mL | Freq: Once | INTRAVENOUS | Status: AC
  Administered 2016-04-21: 1000 mL via INTRAVENOUS

## 2016-04-20 NOTE — ED Triage Notes (Signed)
Pt drinks ETOH, she states last was yesterday, 1 glass of beer. Pt presents w/ N/V, hyperglycemia, and hypertension. Pt c/o abdominal swelling and headache. Pt's abdomen is distended but not taut. Pt is presently dry heaving and hypertensive.

## 2016-04-21 ENCOUNTER — Observation Stay: Payer: Medicaid Other

## 2016-04-21 ENCOUNTER — Encounter: Payer: Self-pay | Admitting: Radiology

## 2016-04-21 ENCOUNTER — Emergency Department: Payer: Medicaid Other

## 2016-04-21 DIAGNOSIS — I160 Hypertensive urgency: Secondary | ICD-10-CM

## 2016-04-21 HISTORY — DX: Hypertensive urgency: I16.0

## 2016-04-21 LAB — CBC
HCT: 34.2 % — ABNORMAL LOW (ref 35.0–47.0)
HEMOGLOBIN: 11.7 g/dL — AB (ref 12.0–16.0)
MCH: 30.7 pg (ref 26.0–34.0)
MCHC: 34.2 g/dL (ref 32.0–36.0)
MCV: 89.8 fL (ref 80.0–100.0)
Platelets: 207 10*3/uL (ref 150–440)
RBC: 3.82 MIL/uL (ref 3.80–5.20)
RDW: 12.2 % (ref 11.5–14.5)
WBC: 9.7 10*3/uL (ref 3.6–11.0)

## 2016-04-21 LAB — BASIC METABOLIC PANEL
ANION GAP: 14 (ref 5–15)
BUN: 23 mg/dL — AB (ref 6–20)
CHLORIDE: 99 mmol/L — AB (ref 101–111)
CO2: 21 mmol/L — AB (ref 22–32)
Calcium: 8.3 mg/dL — ABNORMAL LOW (ref 8.9–10.3)
Creatinine, Ser: 1.12 mg/dL — ABNORMAL HIGH (ref 0.44–1.00)
GFR calc Af Amer: >60 mL/min (ref >60)
GFR, EST NON AFRICAN AMERICAN: 53 mL/min — AB (ref >60)
GLUCOSE: 424 mg/dL — AB (ref 65–99)
POTASSIUM: 4 mmol/L (ref 3.5–5.1)
Sodium: 134 mmol/L — ABNORMAL LOW (ref 135–145)

## 2016-04-21 LAB — ETHANOL

## 2016-04-21 LAB — URINALYSIS, COMPLETE (UACMP) WITH MICROSCOPIC
Bilirubin Urine: NEGATIVE
Ketones, ur: 20 mg/dL — AB
Nitrite: NEGATIVE
PH: 7 (ref 5.0–8.0)
Protein, ur: 100 mg/dL — AB
Specific Gravity, Urine: 1.013 (ref 1.005–1.030)

## 2016-04-21 LAB — GLUCOSE, CAPILLARY
GLUCOSE-CAPILLARY: 188 mg/dL — AB (ref 65–99)
GLUCOSE-CAPILLARY: 148 mg/dL — AB (ref 65–99)
Glucose-Capillary: 329 mg/dL — ABNORMAL HIGH (ref 65–99)
Glucose-Capillary: 386 mg/dL — ABNORMAL HIGH (ref 65–99)
Glucose-Capillary: 318 mg/dL — ABNORMAL HIGH (ref 65–99)

## 2016-04-21 LAB — BLOOD GAS, VENOUS
Acid-Base Excess: 2 mmol/L (ref 0.0–2.0)
BICARBONATE: 27.3 mmol/L (ref 20.0–28.0)
O2 Saturation: 72.1 %
PATIENT TEMPERATURE: 37
PH VEN: 7.4 (ref 7.250–7.430)
PO2 VEN: 38 mmHg (ref 32.0–45.0)
pCO2, Ven: 44 mmHg (ref 44.0–60.0)

## 2016-04-21 LAB — LIPASE, BLOOD: Lipase: 69 U/L — ABNORMAL HIGH (ref 11–51)

## 2016-04-21 LAB — LACTIC ACID, PLASMA: LACTIC ACID, VENOUS: 1.4 mmol/L (ref 0.5–1.9)

## 2016-04-21 MED ORDER — INSULIN ASPART PROT & ASPART (70-30 MIX) 100 UNIT/ML PEN: 15.0000 [IU] | PEN_INJECTOR | Freq: Two times a day (BID) | SUBCUTANEOUS | Status: DC

## 2016-04-21 MED ORDER — PROCHLORPERAZINE EDISYLATE 5 MG/ML IJ SOLN
10.0000 mg | Freq: Once | INTRAMUSCULAR | Status: AC
  Administered 2016-04-21: 10 mg via INTRAVENOUS
  Filled 2016-04-21: qty 2

## 2016-04-21 MED ORDER — ONDANSETRON HCL 4 MG PO TABS: 4.0000 mg | ORAL_TABLET | Freq: Four times a day (QID) | ORAL | Status: DC | PRN

## 2016-04-21 MED ORDER — ONDANSETRON HCL 4 MG/2ML IJ SOLN
4.0000 mg | Freq: Four times a day (QID) | INTRAMUSCULAR | Status: DC | PRN
  Administered 2016-04-21 – 2016-04-24 (×9): 4 mg via INTRAVENOUS
  Filled 2016-04-21 (×8): qty 2

## 2016-04-21 MED ORDER — ACETAMINOPHEN 650 MG RE SUPP: 650.0000 mg | Freq: Four times a day (QID) | RECTAL | Status: DC | PRN

## 2016-04-21 MED ORDER — INSULIN ASPART PROT & ASPART (70-30 MIX) 100 UNIT/ML ~~LOC~~ SUSP
15.0000 [IU] | Freq: Two times a day (BID) | SUBCUTANEOUS | Status: DC
  Administered 2016-04-21 (×2): 15 [IU] via SUBCUTANEOUS
  Filled 2016-04-21 (×2): qty 15

## 2016-04-21 MED ORDER — HYDRALAZINE HCL 20 MG/ML IJ SOLN
10.0000 mg | INTRAMUSCULAR | Status: DC | PRN
  Administered 2016-04-21 – 2016-04-22 (×3): 10 mg via INTRAVENOUS
  Filled 2016-04-21 (×3): qty 1

## 2016-04-21 MED ORDER — METHYLPREDNISOLONE SODIUM SUCC 125 MG IJ SOLR
125.0000 mg | Freq: Once | INTRAMUSCULAR | Status: AC
  Administered 2016-04-21: 125 mg via INTRAVENOUS
  Filled 2016-04-21: qty 2

## 2016-04-21 MED ORDER — AMLODIPINE BESYLATE 10 MG PO TABS
10.0000 mg | ORAL_TABLET | Freq: Every day | ORAL | Status: DC
  Administered 2016-04-23: 10 mg via ORAL
  Filled 2016-04-21 (×2): qty 1

## 2016-04-21 MED ORDER — PANTOPRAZOLE SODIUM 40 MG PO TBEC
40.0000 mg | DELAYED_RELEASE_TABLET | Freq: Every day | ORAL | Status: DC
  Administered 2016-04-21: 40 mg via ORAL
  Filled 2016-04-21: qty 1

## 2016-04-21 MED ORDER — BUTALBITAL-APAP-CAFFEINE 50-325-40 MG PO TABS: 2.0000 | ORAL_TABLET | Freq: Two times a day (BID) | ORAL | Status: DC | PRN

## 2016-04-21 MED ORDER — INSULIN ASPART 100 UNIT/ML ~~LOC~~ SOLN
0.0000 [IU] | Freq: Three times a day (TID) | SUBCUTANEOUS | Status: DC
Start: 2016-04-21 — End: 2016-04-24
  Administered 2016-04-21: 15 [IU] via SUBCUTANEOUS
  Administered 2016-04-21: 11 [IU] via SUBCUTANEOUS
  Administered 2016-04-21: 3 [IU] via SUBCUTANEOUS
  Administered 2016-04-22: 5 [IU] via SUBCUTANEOUS
  Administered 2016-04-22 – 2016-04-23 (×3): 3 [IU] via SUBCUTANEOUS
  Administered 2016-04-23: 8 [IU] via SUBCUTANEOUS
  Administered 2016-04-24: 3 [IU] via SUBCUTANEOUS
  Filled 2016-04-21 (×4): qty 3
  Filled 2016-04-21: qty 15
  Filled 2016-04-21: qty 3
  Filled 2016-04-21: qty 8
  Filled 2016-04-21: qty 11

## 2016-04-21 MED ORDER — METOPROLOL TARTRATE 50 MG PO TABS
50.0000 mg | ORAL_TABLET | Freq: Two times a day (BID) | ORAL | Status: DC
Start: 2016-04-21 — End: 2016-04-24
  Administered 2016-04-21 – 2016-04-24 (×5): 50 mg via ORAL
  Filled 2016-04-21 (×6): qty 1

## 2016-04-21 MED ORDER — LORAZEPAM 2 MG/ML IJ SOLN
2.0000 mg | Freq: Once | INTRAMUSCULAR | Status: AC
  Administered 2016-04-21: 2 mg via INTRAVENOUS
  Filled 2016-04-21: qty 1

## 2016-04-21 MED ORDER — OXYCODONE-ACETAMINOPHEN 5-325 MG PO TABS
2.0000 | ORAL_TABLET | Freq: Four times a day (QID) | ORAL | Status: DC | PRN
  Administered 2016-04-21 (×2): 2 via ORAL
  Administered 2016-04-22: 1 via ORAL
  Administered 2016-04-22 – 2016-04-24 (×4): 2 via ORAL
  Filled 2016-04-21 (×7): qty 2

## 2016-04-21 MED ORDER — ACETAMINOPHEN 325 MG PO TABS: 650.0000 mg | ORAL_TABLET | Freq: Four times a day (QID) | ORAL | Status: DC | PRN

## 2016-04-21 MED ORDER — LISINOPRIL 20 MG PO TABS
40.0000 mg | ORAL_TABLET | Freq: Every day | ORAL | Status: DC
  Administered 2016-04-23 – 2016-04-24 (×2): 40 mg via ORAL
  Filled 2016-04-21 (×3): qty 2

## 2016-04-21 MED ORDER — SODIUM CHLORIDE 0.9% FLUSH
3.0000 mL | Freq: Two times a day (BID) | INTRAVENOUS | Status: DC
  Administered 2016-04-21 – 2016-04-23 (×5): 3 mL via INTRAVENOUS

## 2016-04-21 MED ORDER — AMITRIPTYLINE HCL 25 MG PO TABS
25.0000 mg | ORAL_TABLET | Freq: Every day | ORAL | Status: DC
  Administered 2016-04-21 – 2016-04-23 (×3): 25 mg via ORAL
  Filled 2016-04-21 (×3): qty 1

## 2016-04-21 MED ORDER — LABETALOL HCL 5 MG/ML IV SOLN
10.0000 mg | Freq: Once | INTRAVENOUS | Status: AC
  Administered 2016-04-21: 10 mg via INTRAVENOUS
  Filled 2016-04-21: qty 4

## 2016-04-21 MED ORDER — IOPAMIDOL (ISOVUE-300) INJECTION 61%
15.0000 mL | INTRAVENOUS | Status: AC
  Administered 2016-04-21 (×2): 15 mL via ORAL

## 2016-04-21 MED ORDER — SENNOSIDES-DOCUSATE SODIUM 8.6-50 MG PO TABS
1.0000 | ORAL_TABLET | Freq: Every evening | ORAL | Status: DC | PRN
Start: 2016-04-21 — End: 2016-04-24

## 2016-04-21 MED ORDER — SODIUM CHLORIDE 0.9 % IV SOLN
INTRAVENOUS | Status: DC
  Administered 2016-04-21: 05:00:00 via INTRAVENOUS

## 2016-04-21 MED ORDER — IOPAMIDOL (ISOVUE-300) INJECTION 61%
100.0000 mL | Freq: Once | INTRAVENOUS | Status: AC | PRN
Start: 2016-04-21 — End: 2016-04-21
  Administered 2016-04-21: 100 mL via INTRAVENOUS

## 2016-04-21 MED ORDER — ENOXAPARIN SODIUM 40 MG/0.4ML ~~LOC~~ SOLN
40.0000 mg | SUBCUTANEOUS | Status: DC
  Administered 2016-04-23 – 2016-04-24 (×2): 40 mg via SUBCUTANEOUS
  Filled 2016-04-21 (×3): qty 0.4

## 2016-04-21 MED ORDER — DIPHENHYDRAMINE HCL 50 MG/ML IJ SOLN
25.0000 mg | Freq: Once | INTRAMUSCULAR | Status: AC
  Administered 2016-04-21: 25 mg via INTRAVENOUS
  Filled 2016-04-21: qty 1

## 2016-04-21 MED ORDER — INSULIN ASPART 100 UNIT/ML ~~LOC~~ SOLN
0.0000 [IU] | Freq: Every day | SUBCUTANEOUS | Status: DC
  Filled 2016-04-21: qty 3
  Filled 2016-04-21: qty 2

## 2016-04-21 NOTE — Progress Notes (Addendum)
Inpatient Diabetes Program Recommendations  AACE/ADA: New Consensus Statement on Inpatient Glycemic Control (2015)  Target Ranges:  Prepandial:   less than 140 mg/dL      Peak postprandial:   less than 180 mg/dL (1-2 hours)      Critically ill patients:  140 - 180 mg/dL   Lab Results  Component Value Date   GLUCAP 386 (H) 04/21/2016   HGBA1C 9.7 (H) 10/29/2015    Review of Glycemic Control  Results for Karen, [name] (MRN [id number]) as of 04/21/2016 10:34  Ref. Range 04/20/2016 22:56 04/21/2016 03:20 04/21/2016 08:34  Glucose-Capillary Latest Ref Range: 65 - 99 mg/dL 354 (H) 329 (H) 386 (H)     Diabetes history: Type 2 Outpatient Diabetes medications: Novolog 70/30 15 units bid- patient denies taking this- she tells me she takes 35 units of something in the morning- could not tell me what it is.   Current orders for Inpatient glycemic control: Novolog 70/30 15 units bid, Novolog moderate correction 0-9 units tid, Novolog 0-5 units qhs  Inpatient Diabetes Program Recommendations:  Elevated CBG likely as a result of steroids.   If she remains nauseated, consider taking her off 70/30 insulin and switching her to Lantus 20 units qhs and continue Novolog correction (0-15 units) as ordered.   If patient is not nauseated and is not going to get any additional steroids, consider decreasing Novolog correction to 0-9 units tid, continue Novolog 70/30 as ordered.     Spoke to patient re:  A1C of 9.7% on 10/31/15.   Today, 04/21/16-Patient denies taking 70/30 insulin at home- she tells me she takes 35 units of something in the morning- could not tell me what it is- I have called  Dr. Jerene Dilling office (1:21pm and 1:53pm - answering service, 3:20pm busy) but unable to get through.   Gentry Fitz, RN, BA, MHA, CDE Diabetes Coordinator Inpatient Diabetes Program  [phone] (Team Pager) [phone] ([place]) 04/21/2016 1:26 PM

## 2016-04-21 NOTE — ED Provider Notes (Signed)
[facility] Emergency Department Provider Note   ____________________________________________   First MD Initiated Contact with Patient 04/20/16 2305     (approximate)  I have reviewed the triage vital signs and the nursing notes.   HISTORY  Chief Complaint Emesis; Dizziness; and Hyperglycemia    HPI Karen [name] is a 61 y.o. female who comes into the hospital today with headache, vomiting and abdominal pain. The patient reports that she's had nausea whenever she sits up and dizziness. The patient states that this started this afternoon around 2 PM. She is unsure if she started with the headache first or vomiting and abdominal pain. The patient reports that she has had a little bit of difficulty breathing and states that everything hurts. All over her head and all over her abdomen. She reports that she took her medications this morning but there is no blood pressure medicines and what she has on her. The patient reports that her emesis has been brownish in appearance as well as with some yellowish color. She reports that she feels weak and dizzy. The patient has some mild blurred vision as well. She reports that she has a close rest not feel so dizzy. The patient reports that she had similar episode of pain a year ago but doesn't remember exactly why that was.The patient reports that she does have a history of alcohol use but did not drink tonight. She is here today for evaluation.   Past Medical History:  Diagnosis Date  . Alcoholic pancreatitis   . Asthma   . Depression   . Diabetes mellitus without complication ([place])   . DKA (diabetic ketoacidoses) ([place]) 02/17/2015  . Emphysematous cystitis   . Hypercholesteremia   . Hypertension   . Hypokalemia     Patient Active Problem List   Diagnosis Date Noted  . Abdominal pain   . Acute renal failure ([place])   . Nausea vomiting and diarrhea   . Smoker   . Poorly controlled type 2 diabetes mellitus ([place])   .  Acute renal insufficiency 10/29/2015  . Elevated troponin 10/29/2015  . Urinary tract infection without hematuria 10/29/2015  . Left flank pain 10/29/2015  . Malignant essential hypertension 10/29/2015  . DKA (diabetic ketoacidoses) ([place]) 02/17/2015  . Emphysematous cystitis     Past Surgical History:  Procedure Laterality Date  . ABDOMINAL HYSTERECTOMY  1996  . APPENDECTOMY  1997  . HAND SURGERY  1988  . THYROID SURGERY  2013    Prior to Admission medications   Medication Sig Start Date End Date Taking? Authorizing Provider  amitriptyline (ELAVIL) 25 MG tablet Take 1 tablet (25 mg total) by mouth at bedtime. 11/01/15   Srikar Sudini, MD  amLODipine (NORVASC) 10 MG tablet Take 1 tablet (10 mg total) by mouth daily. 12/13/15   Demetrios Loll, MD  butalbital-acetaminophen-caffeine (FIORICET, ESGIC) [phone] MG tablet Take 2 tablets by mouth every 12 (twelve) hours as needed for headache. 11/01/15   Srikar Sudini, MD  ciprofloxacin (CIPRO) 500 MG tablet Take 1 tablet (500 mg total) by mouth 2 (two) times daily. 01/01/16   Demetrios Loll, MD  insulin aspart protamine - aspart (NOVOLOG MIX 70/30 FLEXPEN) (70-30) 100 UNIT/ML FlexPen Inject 15 Units into the skin 2 (two) times daily.    Historical Provider, MD  lisinopril (PRINIVIL,ZESTRIL) 40 MG tablet Take 1 tablet (40 mg total) by mouth daily. 11/01/15   Srikar Sudini, MD  metoCLOPramide (REGLAN) 5 MG tablet Take 1 tablet (5 mg total) by  mouth 3 (three) times daily before meals. 11/01/15   Hillary Bow, MD  metoprolol (LOPRESSOR) 50 MG tablet Take 1 tablet (50 mg total) by mouth 2 (two) times daily. 12/12/15   Demetrios Loll, MD  oxyCODONE-acetaminophen (PERCOCET) 5-325 MG tablet Take 2 tablets by mouth every 6 (six) hours as needed for moderate pain or severe pain. 05/14/15   Earleen Newport, MD  pantoprazole (PROTONIX) 40 MG tablet Take 1 tablet (40 mg total) by mouth daily. 11/01/15   Hillary Bow, MD    Allergies Penicillins; Reglan  [metoclopramide]; and Percocet [oxycodone-acetaminophen]  Family History  Problem Relation Age of Onset  . Family history unknown: Yes    Social History Social History  Substance Use Topics  . Smoking status: Former Research scientist (life sciences)  . Smokeless tobacco: Never Used  . Alcohol use Yes     Comment: daily    Review of Systems Constitutional: No fever/chills Eyes: No visual changes. ENT: No sore throat. Cardiovascular: Denies chest pain. Respiratory:  shortness of breath. Gastrointestinal:  abdominal pain.  nausea,  vomiting.  No diarrhea.  No constipation. Genitourinary: Negative for dysuria. Musculoskeletal: Negative for back pain. Skin: Negative for rash. Neurological: Headache  10-point ROS otherwise negative.  ____________________________________________   PHYSICAL EXAM:  VITAL SIGNS: ED Triage Vitals  Enc Vitals Group     BP 04/20/16 2230 (!) 221/103     Pulse Rate 04/20/16 2239 94     Resp 04/20/16 2230 (!) 28     Temp 04/20/16 2239 97.9 F (36.6 C)     Temp Source 04/20/16 2239 Oral     SpO2 04/20/16 2239 100 %     Weight 04/20/16 2239 115 lb (52.2 kg)     Height 04/20/16 2239 5\' 1"  (1.549 m)     Head Circumference --      Peak Flow --      Pain Score 04/20/16 2238 10     Pain Loc --      Pain Edu? --      Excl. in [place]? --     Constitutional: Alert and oriented. Well appearing and in Mild distress. Eyes: Conjunctivae are normal. PERRL. EOMI. Head: Atraumatic. Nose: No congestion/rhinnorhea. Mouth/Throat: Mucous membranes are moist.  Oropharynx non-erythematous. Cardiovascular: Normal rate, regular rhythm. Grossly normal heart sounds.  Good peripheral circulation. Respiratory: Normal respiratory effort.  No retractions. Lungs CTAB. Gastrointestinal: Soft Upper abdomen tenderness to palpation. No distention. Positive bowel sounds Musculoskeletal: No lower extremity tenderness nor edema.   Neurologic:  Normal speech and language. Cranial nerves II through XII are  grossly intact with no focal motor or neuro deficit Skin:  Skin is warm, dry and intact.  Psychiatric: Mood and affect are normal.   ____________________________________________   LABS (all labs ordered are listed, but only abnormal results are displayed)  Labs Reviewed  URINALYSIS, COMPLETE (UACMP) WITH MICROSCOPIC - Abnormal; Notable for the following:       Result Value   Color, Urine STRAW (*)    APPearance CLEAR (*)    Glucose, UA >=500 (*)    Hgb urine dipstick SMALL (*)    Ketones, ur 20 (*)    Protein, ur 100 (*)    Leukocytes, UA TRACE (*)    Bacteria, UA RARE (*)    Squamous Epithelial / LPF 0-5 (*)    All other components within normal limits  TROPONIN I - Abnormal; Notable for the following:    Troponin I 0.03 (*)    All other components within  normal limits  COMPREHENSIVE METABOLIC PANEL - Abnormal; Notable for the following:    Chloride 99 (*)    Glucose, Bld 335 (*)    BUN 22 (*)    Total Protein 8.4 (*)    Alkaline Phosphatase 176 (*)    All other components within normal limits  CBC WITH DIFFERENTIAL/PLATELET - Abnormal; Notable for the following:    HCT 34.7 (*)    Neutro Abs 7.2 (*)    All other components within normal limits  GLUCOSE, CAPILLARY - Abnormal; Notable for the following:    Glucose-Capillary 354 (*)    All other components within normal limits  LIPASE, BLOOD - Abnormal; Notable for the following:    Lipase 69 (*)    All other components within normal limits  GLUCOSE, CAPILLARY - Abnormal; Notable for the following:    Glucose-Capillary 329 (*)    All other components within normal limits  ETHANOL  LACTIC ACID, PLASMA  BLOOD GAS, VENOUS  LACTIC ACID, PLASMA  CBG MONITORING, ED  CBG MONITORING, ED   ____________________________________________  EKG  ED ECG REPORT I, Loney Hering, the attending physician, personally viewed and interpreted this ECG.   Date: 04/21/2016  EKG Time: 2235  Rate: 99  Rhythm: normal sinus  rhythm  Axis: normal  Intervals:none  ST&T Change: none  ____________________________________________  RADIOLOGY  CT head ____________________________________________   PROCEDURES  Procedure(s) performed: None  Procedures  Critical Care performed: No  ____________________________________________   INITIAL IMPRESSION / ASSESSMENT AND PLAN / ED COURSE  Pertinent labs & imaging results that were available during my care of the patient were reviewed by me and considered in my medical decision making (see chart for details).  This is a 61 year old female who comes into the hospital today with some headache and vomiting, abdominal pain and dizziness. The patient's blood pressure is elevated. She was given some labetalol by the prior physician. Looking back at the patient's history she's had this multiple times with either DKA or pancreatitis. The patient is also had elevated blood pressure before with some of the symptoms. I will give the patient liter of normal saline and she did receive some Zofran. I will also give her some morphine and I will reassess the patient once I receive all of her results.  Clinical Course as of Apr 21 333  Tue Apr 21, 2016  0003 1. No acute intracranial process. 2. Mild age-related cerebral atrophy with chronic small vessel ischemic disease, stable   CT Head Wo Contrast [AW]  0314 Diffuse fatty infiltration of the liver. Small esophageal hiatal hernia. Diffuse bladder wall thickening suggesting cystitis. No evidence of bowel obstruction or inflammation   CT Abdomen Pelvis W Contrast [AW]    Clinical Course User Index [AW] Loney Hering, MD   The patient received 2 doses of labetalol trying to assist with her pressure. She also received some Compazine and Benadryl as well as some Solu-Medrol and Ativan for her headache. The patient's blood pressure is still elevated and she continues to complain of headache. Her pancreas enzyme is slightly  elevated as well. I will admit the patient to the hospitalist service for further evaluation and treatment of her symptoms.  ____________________________________________   FINAL CLINICAL IMPRESSION(S) / ED DIAGNOSES  Final diagnoses:  Hypertensive urgency  Acute intractable headache, unspecified headache type  Dizziness  Chronic pancreatitis, unspecified pancreatitis type ([place])      NEW MEDICATIONS STARTED DURING THIS VISIT:  New Prescriptions   No  medications on file     Note:  This document was prepared using Dragon voice recognition software and may include unintentional dictation errors.    Loney Hering, MD 04/21/16 [phone]

## 2016-04-21 NOTE — Progress Notes (Signed)
BP remains soft all morning, MD aware of most recent BP 122/69. Instructed to hold all BP morning meds.

## 2016-04-21 NOTE — H&P (Addendum)
[facility] [place] NAME: Tonnia Bardin    MR#:  [id number]  DATE OF BIRTH:  26-Sep-1955  DATE OF ADMISSION:  04/20/2016  PRIMARY CARE PHYSICIAN: Marden Noble, MD   REQUESTING/REFERRING PHYSICIAN:   CHIEF COMPLAINT:   Chief Complaint  Patient presents with  . Emesis  . Dizziness  . Hyperglycemia    HISTORY OF PRESENT ILLNESS: Tamaya Pun  is a 61 y.o. female with a known history of Alcoholic pancreatitis, bronchial asthma, diabetes mellitus, emphysematous cystitis, hyperlipidemia, hypertension presented to the emergency room with the nausea vomiting and headache. Patient also had some dizziness. The headache is aching in nature 4 out of 10 on a scale of 1-10. Vomitus contained food and water. No history of hematemesis and rectal bleed. Patient blood pressure was elevated in the emergency room systolic greater than 619 mmHg. No complaints of any tingling, numbness in any part of the body. No weakness, and no difficulty in speech or any swallowing of food. Hospitalist service was consulted for the care of the patient.  PAST MEDICAL HISTORY:   Past Medical History:  Diagnosis Date  . Alcoholic pancreatitis   . Asthma   . Depression   . Diabetes mellitus without complication ([place])   . DKA (diabetic ketoacidoses) ([place]) 02/17/2015  . Emphysematous cystitis   . Hypercholesteremia   . Hypertension   . Hypokalemia     PAST SURGICAL HISTORY: Past Surgical History:  Procedure Laterality Date  . ABDOMINAL HYSTERECTOMY  1996  . APPENDECTOMY  1997  . HAND SURGERY  1988  . THYROID SURGERY  2013    SOCIAL HISTORY:  Social History  Substance Use Topics  . Smoking status: Former Research scientist (life sciences)  . Smokeless tobacco: Never Used  . Alcohol use Yes     Comment: daily    FAMILY HISTORY:  Patient came to [place] as a small child Does not know medical history of her family  DRUG ALLERGIES:  Allergies  Allergen Reactions  . Penicillins Anaphylaxis     Has patient had a PCN reaction causing immediate rash, facial/tongue/throat swelling, SOB or lightheadedness with hypotension: Yes Has patient had a PCN reaction causing severe rash involving mucus membranes or skin necrosis: No Has patient had a PCN reaction that required hospitalization No Has patient had a PCN reaction occurring within the last 10 years: No If all of the above answers are "NO", then may proceed with Cephalosporin use.  . Reglan [Metoclopramide] Other (See Comments)    Hypotension, shortness of breath  . Percocet [Oxycodone-Acetaminophen] Rash    REVIEW OF SYSTEMS:   CONSTITUTIONAL: No fever, has weakness.  EYES: No blurred or double vision.  EARS, NOSE, AND THROAT: No tinnitus or ear pain.  RESPIRATORY: No cough, shortness of breath, wheezing or hemoptysis.  CARDIOVASCULAR: No chest pain, orthopnea, edema.  GASTROINTESTINAL: Has nausea, vomiting, mild abdominal pain.  No diarrhoea GENITOURINARY: No dysuria, hematuria.  ENDOCRINE: No polyuria, nocturia,  HEMATOLOGY: No anemia, easy bruising or bleeding SKIN: No rash or lesion. MUSCULOSKELETAL: No joint pain or arthritis.   NEUROLOGIC: No tingling, numbness, weakness.  Had headache PSYCHIATRY: No anxiety or depression.   MEDICATIONS AT HOME:  Prior to Admission medications   Medication Sig Start Date End Date Taking? Authorizing Provider  atorvastatin (LIPITOR) 10 MG tablet Take 10 mg by mouth daily at 6 PM.   Yes Historical Provider, MD  glimepiride (AMARYL) 2 MG tablet Take 2 mg by mouth daily with breakfast.  Yes Historical Provider, MD  sertraline (ZOLOFT) 50 MG tablet Take 50 mg by mouth daily.   Yes Historical Provider, MD  traZODone (DESYREL) 50 MG tablet Take 50 mg by mouth at bedtime.   Yes Historical Provider, MD  amitriptyline (ELAVIL) 25 MG tablet Take 1 tablet (25 mg total) by mouth at bedtime. 11/01/15   Srikar Sudini, MD  amLODipine (NORVASC) 10 MG tablet Take 1 tablet (10 mg total) by mouth  daily. 12/13/15   Demetrios Loll, MD  butalbital-acetaminophen-caffeine (FIORICET, ESGIC) [phone] MG tablet Take 2 tablets by mouth every 12 (twelve) hours as needed for headache. 11/01/15   Srikar Sudini, MD  ciprofloxacin (CIPRO) 500 MG tablet Take 1 tablet (500 mg total) by mouth 2 (two) times daily. 01/01/16   Demetrios Loll, MD  insulin aspart protamine - aspart (NOVOLOG MIX 70/30 FLEXPEN) (70-30) 100 UNIT/ML FlexPen Inject 15 Units into the skin 2 (two) times daily.    Historical Provider, MD  lisinopril (PRINIVIL,ZESTRIL) 40 MG tablet Take 1 tablet (40 mg total) by mouth daily. 11/01/15   Srikar Sudini, MD  metoCLOPramide (REGLAN) 5 MG tablet Take 1 tablet (5 mg total) by mouth 3 (three) times daily before meals. 11/01/15   Hillary Bow, MD  metoprolol (LOPRESSOR) 50 MG tablet Take 1 tablet (50 mg total) by mouth 2 (two) times daily. 12/12/15   Demetrios Loll, MD  oxyCODONE-acetaminophen (PERCOCET) 5-325 MG tablet Take 2 tablets by mouth every 6 (six) hours as needed for moderate pain or severe pain. 05/14/15   Earleen Newport, MD  pantoprazole (PROTONIX) 40 MG tablet Take 1 tablet (40 mg total) by mouth daily. 11/01/15   Srikar Sudini, MD      PHYSICAL EXAMINATION:   VITAL SIGNS: Blood pressure (!) 197/96, pulse 97, temperature 97.9 F (36.6 C), temperature source Oral, resp. rate (!) 21, height 5\' 1"  (1.549 m), weight 52.2 kg (115 lb), SpO2 98 %.  GENERAL:  61 y.o.-year-old patient lying in the bed with no acute distress.  EYES: Pupils equal, round, reactive to light and accommodation. No scleral icterus. Extraocular muscles intact.  HEENT: Head atraumatic, normocephalic. Oropharynx dry and nasopharynx clear.  NECK:  Supple, no jugular venous distention. No thyroid enlargement, no tenderness.  LUNGS: Normal breath sounds bilaterally, no wheezing, rales,rhonchi or crepitation. No use of accessory muscles of respiration.  CARDIOVASCULAR: S1, S2 normal. No murmurs, rubs, or gallops.  ABDOMEN: Soft,  mild tenderness around umbilicus, nondistended. Bowel sounds present. No organomegaly or mass.  EXTREMITIES: No pedal edema, cyanosis, or clubbing.  NEUROLOGIC: Cranial nerves II through XII are intact. Muscle strength 5/5 in all extremities. Sensation intact. Gait not checked.  PSYCHIATRIC: The patient is alert and oriented x 3.  SKIN: No obvious rash, lesion, or ulcer.   LABORATORY PANEL:   CBC  Recent Labs Lab 04/20/16 2306  WBC 8.9  HGB 12.1  HCT 34.7*  PLT 196  MCV 88.5  MCH 30.9  MCHC 34.9  RDW 12.6  LYMPHSABS 1.2  MONOABS 0.4  EOSABS 0.1  BASOSABS 0.0   ------------------------------------------------------------------------------------------------------------------  Chemistries   Recent Labs Lab 04/20/16 2306  NA 135  K 3.7  CL 99*  CO2 25  GLUCOSE 335*  BUN 22*  CREATININE 0.97  CALCIUM 8.9  AST 29  ALT 32  ALKPHOS 176*  BILITOT 0.7   ------------------------------------------------------------------------------------------------------------------ estimated creatinine clearance is 47.1 mL/min (by C-G formula based on SCr of 0.97 mg/dL). ------------------------------------------------------------------------------------------------------------------ No results for input(s): TSH, T4TOTAL, T3FREE, THYROIDAB in the last 72  hours.  Invalid input(s): FREET3   Coagulation profile No results for input(s): INR, PROTIME in the last 168 hours. ------------------------------------------------------------------------------------------------------------------- No results for input(s): DDIMER in the last 72 hours. -------------------------------------------------------------------------------------------------------------------  Cardiac Enzymes  Recent Labs Lab 04/20/16 2306  TROPONINI 0.03*   ------------------------------------------------------------------------------------------------------------------ Invalid input(s):  POCBNP  ---------------------------------------------------------------------------------------------------------------  Urinalysis    Component Value Date/Time   COLORURINE STRAW (A) 04/21/2016 0059   APPEARANCEUR CLEAR (A) 04/21/2016 0059   APPEARANCEUR Hazy 08/11/2013 0729   LABSPEC 1.013 04/21/2016 0059   LABSPEC 1.030 08/11/2013 0729   PHURINE 7.0 04/21/2016 0059   GLUCOSEU >=500 (A) 04/21/2016 0059   GLUCOSEU >=500 08/11/2013 0729   HGBUR SMALL (A) 04/21/2016 0059   BILIRUBINUR NEGATIVE 04/21/2016 0059   BILIRUBINUR Negative 08/11/2013 0729   KETONESUR 20 (A) 04/21/2016 0059   PROTEINUR 100 (A) 04/21/2016 0059   NITRITE NEGATIVE 04/21/2016 0059   LEUKOCYTESUR TRACE (A) 04/21/2016 0059   LEUKOCYTESUR Negative 08/11/2013 0729     RADIOLOGY: Ct Head Wo Contrast  Result Date: 04/20/2016 CLINICAL DATA:  Initial evaluation for acute severe headache. Hypertension. EXAM: CT HEAD WITHOUT CONTRAST TECHNIQUE: Contiguous axial images were obtained from the base of the skull through the vertex without intravenous contrast. COMPARISON:  Prior MRI from 12/30/2015. FINDINGS: Brain: Stable atrophy with mild chronic small vessel ischemic disease. No acute intracranial hemorrhage. No evidence for acute large vessel territory infarct. No mass lesion, midline shift or mass effect. No hydrocephalus. No extra-axial fluid collection. Vascular: No hyperdense vessel. Scattered vascular calcifications noted within the carotid siphons and distal vertebral arteries. Skull: Scalp soft tissues demonstrate no acute abnormality. Calvarium intact. Sinuses/Orbits: Visualized globes and oval soft tissues within normal limits. Mild scattered mucosal thickening within the visualized ethmoidal air cells. Paranasal sinuses are otherwise clear. Small left mastoid effusion noted. Right mastoid air cells clear. The IMPRESSION: 1. No acute intracranial process. 2. Mild age-related cerebral atrophy with chronic small vessel  ischemic disease, stable. Electronically Signed   By: Jeannine Boga M.D.   On: 04/20/2016 23:35   Ct Abdomen Pelvis W Contrast  Result Date: 04/21/2016 CLINICAL DATA:  Abdominal pain, nausea and vomiting, hyperglycemia, and hypertension. Abdominal swelling and headache. History of appendectomy and hysterectomy. Unable to tolerate oral contrast material. EXAM: CT ABDOMEN AND PELVIS WITH CONTRAST TECHNIQUE: Multidetector CT imaging of the abdomen and pelvis was performed using the standard protocol following bolus administration of intravenous contrast. CONTRAST:  158mL ISOVUE-300 IOPAMIDOL (ISOVUE-300) INJECTION 61% COMPARISON:  12/31/2015 FINDINGS: Lower chest: Dependent atelectasis in the lung bases. Small esophageal hiatal hernia. Hepatobiliary: Diffuse fatty infiltration of the liver. No focal lesions identified. Gallbladder and bile ducts are unremarkable. Pancreas: Unremarkable. No pancreatic ductal dilatation or surrounding inflammatory changes. Spleen: Normal in size without focal abnormality. Adrenals/Urinary Tract: Adrenal glands are unremarkable. Kidneys are normal, without renal calculi, focal lesion, or hydronephrosis. Bladder wall is diffusely thickened, suggesting cystitis. No filling defects. Stomach/Bowel: Stomach and small bowel are unremarkable. No evidence of abnormal distention. Scattered stool within the colon. Scattered colonic diverticula most prominent in the right colon. No colonic distention or wall thickening. Appendix is surgically absent. Vascular/Lymphatic: Aortic atherosclerosis. No enlarged abdominal or pelvic lymph nodes. Reproductive: Status post hysterectomy. No adnexal masses. Other: No abdominal wall hernia or abnormality. No abdominopelvic ascites. Musculoskeletal: Degenerative changes in the spine. No destructive bone lesions. IMPRESSION: Diffuse fatty infiltration of the liver. Small esophageal hiatal hernia. Diffuse bladder wall thickening suggesting cystitis. No  evidence of bowel obstruction or inflammation. Electronically Signed   By:  Lucienne Capers M.D.   On: 04/21/2016 03:06    EKG: Orders placed or performed during the hospital encounter of 04/20/16  . ED EKG  . ED EKG  . ED EKG  . ED EKG    IMPRESSION AND PLAN: 61 year old female patient with history of emphysematous cystitis, diabetes mellitus,, alcohol pancreas, hypertension, hyperlipidemia presented to the emergency room with nausea, vomiting and abdominal discomfort. Admitting diagnosis 1. Hypertensive urgency 2. Dehydration 3. Nausea and vomiting 4. Uncontrolled diabetes mellitus Treatment plan Admit patient to telemetry observation bed Control blood pressure with oral beta blocker, Norvasc and when necessary hydralazine IV fluid hydration Antiemetics for nausea and vomiting Control blood sugars with sliding scale coverage insulin Monitor electrolytes Supportive care  All the records are reviewed and case discussed with ED provider. Management plans discussed with the patient, family and they are in agreement.  CODE STATUS:FULL CODE Code Status History    Date Active Date Inactive Code Status Order ID Comments User Context   12/31/2015  7:39 AM 01/01/2016  8:08 PM Full Code [id number]  Saundra Shelling, MD Inpatient   12/10/2015 11:09 AM 12/12/2015  6:48 PM Full Code [id number]  Demetrios Loll, MD Inpatient   10/29/2015  3:45 PM 11/01/2015  4:23 PM Full Code [id number]  Theodoro Grist, MD Inpatient   02/17/2015  5:04 PM 02/19/2015  6:04 PM Full Code [id number]  Nicholes Mango, MD Inpatient       TOTAL TIME TAKING CARE OF THIS PATIENT: 54 minutes.    Saundra Shelling M.D on 04/21/2016 at 4:30 AM  Between 7am to 6pm - Pager - [phone]  After 6pm go to [email or web address] - password [email or web address] [facility]  Office  [phone]  CC: Primary care physician; Marden Noble, MD

## 2016-04-22 LAB — GLUCOSE, CAPILLARY
GLUCOSE-CAPILLARY: 194 mg/dL — AB (ref 65–99)
GLUCOSE-CAPILLARY: 156 mg/dL — AB (ref 65–99)
GLUCOSE-CAPILLARY: 228 mg/dL — AB (ref 65–99)
GLUCOSE-CAPILLARY: 168 mg/dL — AB (ref 65–99)

## 2016-04-22 MED ORDER — SODIUM CHLORIDE 0.9 % IV SOLN
INTRAVENOUS | Status: DC
  Administered 2016-04-22 – 2016-04-24 (×4): via INTRAVENOUS

## 2016-04-22 MED ORDER — INSULIN ASPART PROT & ASPART (70-30 MIX) 100 UNIT/ML ~~LOC~~ SUSP
15.0000 [IU] | Freq: Two times a day (BID) | SUBCUTANEOUS | Status: DC
  Administered 2016-04-23: 15 [IU] via SUBCUTANEOUS
  Filled 2016-04-22: qty 15

## 2016-04-22 MED ORDER — MORPHINE SULFATE (PF) 4 MG/ML IV SOLN
2.0000 mg | INTRAVENOUS | Status: DC | PRN
  Administered 2016-04-22 – 2016-04-23 (×4): 2 mg via INTRAVENOUS
  Filled 2016-04-22 (×4): qty 1

## 2016-04-22 MED ORDER — PANTOPRAZOLE SODIUM 40 MG IV SOLR
40.0000 mg | Freq: Two times a day (BID) | INTRAVENOUS | Status: DC
  Administered 2016-04-22 – 2016-04-24 (×5): 40 mg via INTRAVENOUS
  Filled 2016-04-22 (×5): qty 40

## 2016-04-22 MED ORDER — PROMETHAZINE HCL 25 MG/ML IJ SOLN
25.0000 mg | Freq: Four times a day (QID) | INTRAMUSCULAR | Status: DC | PRN
  Administered 2016-04-22 – 2016-04-23 (×3): 25 mg via INTRAVENOUS
  Filled 2016-04-22 (×3): qty 1

## 2016-04-22 NOTE — Progress Notes (Signed)
[facility] [place] NAME: Karen [name]    MR#:  [id number]  DATE OF BIRTH:  06-09-1955  SUBJECTIVE:  CHIEF COMPLAINT:   Chief Complaint  Patient presents with  . Emesis  . Dizziness  . Hyperglycemia    Came with very high blood pressure and dizziness , also had high blood sugar.   She have nausea and vomit.    She confirms her medication compliance.  REVIEW OF SYSTEMS:  CONSTITUTIONAL: No fever, fatigue or weakness.  EYES: No blurred or double vision.  EARS, NOSE, AND THROAT: No tinnitus or ear pain.  RESPIRATORY: No cough, shortness of breath, wheezing or hemoptysis.  CARDIOVASCULAR: No chest pain, orthopnea, edema.  GASTROINTESTINAL: No nausea, vomiting, diarrhea or abdominal pain.  GENITOURINARY: No dysuria, hematuria.  ENDOCRINE: No polyuria, nocturia,  HEMATOLOGY: No anemia, easy bruising or bleeding SKIN: No rash or lesion. MUSCULOSKELETAL: No joint pain or arthritis.   NEUROLOGIC: No tingling, numbness, weakness.  PSYCHIATRY: No anxiety or depression.   ROS  DRUG ALLERGIES:   Allergies  Allergen Reactions  . Penicillins Anaphylaxis    Has patient had a PCN reaction causing immediate rash, facial/tongue/throat swelling, SOB or lightheadedness with hypotension: Yes Has patient had a PCN reaction causing severe rash involving mucus membranes or skin necrosis: No Has patient had a PCN reaction that required hospitalization No Has patient had a PCN reaction occurring within the last 10 years: No If all of the above answers are "NO", then may proceed with Cephalosporin use.  . Reglan [Metoclopramide] Other (See Comments)    Hypotension, shortness of breath  . Percocet [Oxycodone-Acetaminophen] Rash    VITALS:  Blood pressure 131/74, pulse 91, temperature 98.1 F (36.7 C), temperature source Oral, resp. rate 18, height 5\' 1"  (1.549 m), weight 50 kg (110 lb 3.2 oz), SpO2 99 %.  PHYSICAL EXAMINATION:  GENERAL:  61  y.o.-year-old patient lying in the bed with no acute distress.  EYES: Pupils equal, round, reactive to light and accommodation. No scleral icterus. Extraocular muscles intact.  HEENT: Head atraumatic, normocephalic. Oropharynx and nasopharynx clear.  NECK:  Supple, no jugular venous distention. No thyroid enlargement, no tenderness.  LUNGS: Normal breath sounds bilaterally, no wheezing, rales,rhonchi or crepitation. No use of accessory muscles of respiration.  CARDIOVASCULAR: S1, S2 normal. No murmurs, rubs, or gallops.  ABDOMEN: Soft, nontender, nondistended. Bowel sounds present. No organomegaly or mass.  EXTREMITIES: No pedal edema, cyanosis, or clubbing.  NEUROLOGIC: Cranial nerves II through XII are intact. Muscle strength 5/5 in all extremities. Sensation intact. Gait not checked. Checked finger nose test, satisfactory. PSYCHIATRIC: The patient is alert and oriented x 3.  SKIN: No obvious rash, lesion, or ulcer.   Physical Exam LABORATORY PANEL:   CBC  Recent Labs Lab 04/21/16 0601  WBC 9.7  HGB 11.7*  HCT 34.2*  PLT 207   ------------------------------------------------------------------------------------------------------------------  Chemistries   Recent Labs Lab 04/20/16 2306 04/21/16 0601  NA 135 134*  K 3.7 4.0  CL 99* 99*  CO2 25 21*  GLUCOSE 335* 424*  BUN 22* 23*  CREATININE 0.97 1.12*  CALCIUM 8.9 8.3*  AST 29  --   ALT 32  --   ALKPHOS 176*  --   BILITOT 0.7  --    ------------------------------------------------------------------------------------------------------------------  Cardiac Enzymes  Recent Labs Lab 04/20/16 2306  TROPONINI 0.03*   ------------------------------------------------------------------------------------------------------------------  RADIOLOGY:  Ct Head Wo Contrast  Result Date: 04/20/2016 CLINICAL DATA:  Initial evaluation for acute severe  headache. Hypertension. EXAM: CT HEAD WITHOUT CONTRAST TECHNIQUE: Contiguous  axial images were obtained from the base of the skull through the vertex without intravenous contrast. COMPARISON:  Prior MRI from 12/30/2015. FINDINGS: Brain: Stable atrophy with mild chronic small vessel ischemic disease. No acute intracranial hemorrhage. No evidence for acute large vessel territory infarct. No mass lesion, midline shift or mass effect. No hydrocephalus. No extra-axial fluid collection. Vascular: No hyperdense vessel. Scattered vascular calcifications noted within the carotid siphons and distal vertebral arteries. Skull: Scalp soft tissues demonstrate no acute abnormality. Calvarium intact. Sinuses/Orbits: Visualized globes and oval soft tissues within normal limits. Mild scattered mucosal thickening within the visualized ethmoidal air cells. Paranasal sinuses are otherwise clear. Small left mastoid effusion noted. Right mastoid air cells clear. The IMPRESSION: 1. No acute intracranial process. 2. Mild age-related cerebral atrophy with chronic small vessel ischemic disease, stable. Electronically Signed   By: Jeannine Boga M.D.   On: 04/20/2016 23:35   Mr Brain Wo Contrast  Result Date: 04/21/2016 CLINICAL DATA:  61 y/o F; nausea, vomiting, headaches, and dizziness with hypertension. EXAM: MRI HEAD WITHOUT CONTRAST TECHNIQUE: Multiplanar, multiecho pulse sequences of the brain and surrounding structures were obtained without intravenous contrast. COMPARISON:  04/20/2016 CT head.  12/30/2015 MRI brain. FINDINGS: Brain: No acute infarction, hemorrhage, hydrocephalus, extra-axial collection or mass lesion. Stable foci of susceptibility hypointensity are present within right periatrial periventricular white matter compatible with hemosiderin deposition probably representing sequelae of old microhemorrhage. Few stable nonspecific foci of T2 FLAIR hyperintense signal abnormality are present in periventricular and subcortical white matter, likely microvascular ischemic changes. Vascular:  Normal flow voids. Skull and upper cervical spine: Normal marrow signal. Sinuses/Orbits: Patchy right ethmoid air cell opacification and small left mastoid effusion. Orbits are unremarkable. Other: None. IMPRESSION: 1. No acute intracranial abnormality identified. 2. Stable mild chronic microvascular ischemic changes of the brain. 3. Small left mastoid air cell effusion. Electronically Signed   By: Kristine Garbe M.D.   On: 04/21/2016 14:40   Ct Abdomen Pelvis W Contrast  Result Date: 04/21/2016 CLINICAL DATA:  Abdominal pain, nausea and vomiting, hyperglycemia, and hypertension. Abdominal swelling and headache. History of appendectomy and hysterectomy. Unable to tolerate oral contrast material. EXAM: CT ABDOMEN AND PELVIS WITH CONTRAST TECHNIQUE: Multidetector CT imaging of the abdomen and pelvis was performed using the standard protocol following bolus administration of intravenous contrast. CONTRAST:  122mL ISOVUE-300 IOPAMIDOL (ISOVUE-300) INJECTION 61% COMPARISON:  12/31/2015 FINDINGS: Lower chest: Dependent atelectasis in the lung bases. Small esophageal hiatal hernia. Hepatobiliary: Diffuse fatty infiltration of the liver. No focal lesions identified. Gallbladder and bile ducts are unremarkable. Pancreas: Unremarkable. No pancreatic ductal dilatation or surrounding inflammatory changes. Spleen: Normal in size without focal abnormality. Adrenals/Urinary Tract: Adrenal glands are unremarkable. Kidneys are normal, without renal calculi, focal lesion, or hydronephrosis. Bladder wall is diffusely thickened, suggesting cystitis. No filling defects. Stomach/Bowel: Stomach and small bowel are unremarkable. No evidence of abnormal distention. Scattered stool within the colon. Scattered colonic diverticula most prominent in the right colon. No colonic distention or wall thickening. Appendix is surgically absent. Vascular/Lymphatic: Aortic atherosclerosis. No enlarged abdominal or pelvic lymph nodes.  Reproductive: Status post hysterectomy. No adnexal masses. Other: No abdominal wall hernia or abnormality. No abdominopelvic ascites. Musculoskeletal: Degenerative changes in the spine. No destructive bone lesions. IMPRESSION: Diffuse fatty infiltration of the liver. Small esophageal hiatal hernia. Diffuse bladder wall thickening suggesting cystitis. No evidence of bowel obstruction or inflammation. Electronically Signed   By: Lucienne Capers M.D.   On:  04/21/2016 03:06    ASSESSMENT AND PLAN:   Active Problems:   Hypertensive urgency  * Hypertensive urgency    After getting labetalol and hydralazine injections in ER- now BP running normal, we may have to hold some of her scheduled oral meds.   Resume oral meds from tonight.  * nausea and vomit    May be viral gastritis   Supportive care.   Will also check MRI brain as she have c/o some dizziness and Htn with this.  * uncontrolled DM   Cont inuslin with meal and ISS.    All the records are reviewed and case discussed with Care Management/Social Workerr. Management plans discussed with the patient, family and they are in agreement.  CODE STATUS: Full  TOTAL TIME TAKING CARE OF THIS PATIENT: 35 minutes.     POSSIBLE D/C IN 1-2 DAYS, DEPENDING ON CLINICAL CONDITION.   Vaughan Basta M.D on 04/22/2016   Between 7am to 6pm - Pager - [phone]  After 6pm go to [email or web address] - password [email or web address] [facility]  Office  [phone]  CC: Primary care physician; Marden Noble, MD  Note: This dictation was prepared with Dragon dictation along with smaller phrase technology. Any transcriptional errors that result from this process are unintentional.

## 2016-04-22 NOTE — Progress Notes (Signed)
[facility] [place] NAME: Karen [name]    MR#:  [id number]  DATE OF BIRTH:  1955/04/21  SUBJECTIVE:  CHIEF COMPLAINT:   Chief Complaint  Patient presents with  . Emesis  . Dizziness  . Hyperglycemia    Came with very high blood pressure and dizziness , also had high blood sugar.   She have continuous nausea and vomit. Could not tolerate food or medicine in last 24 hrs.   She confirms her medication compliance.  REVIEW OF SYSTEMS:  CONSTITUTIONAL: No fever, fatigue or weakness.  EYES: No blurred or double vision.  EARS, NOSE, AND THROAT: No tinnitus or ear pain.  RESPIRATORY: No cough, shortness of breath, wheezing or hemoptysis.  CARDIOVASCULAR: No chest pain, orthopnea, edema.  GASTROINTESTINAL: positive for nausea, vomiting, no diarrhea or abdominal pain.  GENITOURINARY: No dysuria, hematuria.  ENDOCRINE: No polyuria, nocturia,  HEMATOLOGY: No anemia, easy bruising or bleeding SKIN: No rash or lesion. MUSCULOSKELETAL: No joint pain or arthritis.   NEUROLOGIC: No tingling, numbness, weakness.  PSYCHIATRY: No anxiety or depression.   ROS  DRUG ALLERGIES:   Allergies  Allergen Reactions  . Penicillins Anaphylaxis    Has patient had a PCN reaction causing immediate rash, facial/tongue/throat swelling, SOB or lightheadedness with hypotension: Yes Has patient had a PCN reaction causing severe rash involving mucus membranes or skin necrosis: No Has patient had a PCN reaction that required hospitalization No Has patient had a PCN reaction occurring within the last 10 years: No If all of the above answers are "NO", then may proceed with Cephalosporin use.  . Reglan [Metoclopramide] Other (See Comments)    Hypotension, shortness of breath  . Percocet [Oxycodone-Acetaminophen] Rash    VITALS:  Blood pressure (!) 159/80, pulse 91, temperature 99.2 F (37.3 C), temperature source Oral, resp. rate 18, height 5\' 1"  (1.549 m), weight 50 kg  (110 lb 3.2 oz), SpO2 93 %.  PHYSICAL EXAMINATION:  GENERAL:  61 y.o.-year-old patient lying in the bed with acute distress due to continuous nausea.  EYES: Pupils equal, round, reactive to light and accommodation. No scleral icterus. Extraocular muscles intact.  HEENT: Head atraumatic, normocephalic. Oropharynx and nasopharynx clear.  NECK:  Supple, no jugular venous distention. No thyroid enlargement, no tenderness.  LUNGS: Normal breath sounds bilaterally, no wheezing, rales,rhonchi or crepitation. No use of accessory muscles of respiration.  CARDIOVASCULAR: S1, S2 normal. No murmurs, rubs, or gallops.  ABDOMEN: Soft, nontender, nondistended. Bowel sounds present. No organomegaly or mass.  EXTREMITIES: No pedal edema, cyanosis, or clubbing.  NEUROLOGIC: Cranial nerves II through XII are intact. Muscle strength 5/5 in all extremities. Sensation intact. Gait not checked. Checked finger nose test, satisfactory. PSYCHIATRIC: The patient is alert and oriented x 3.  SKIN: No obvious rash, lesion, or ulcer.   Physical Exam LABORATORY PANEL:   CBC  Recent Labs Lab 04/21/16 0601  WBC 9.7  HGB 11.7*  HCT 34.2*  PLT 207   ------------------------------------------------------------------------------------------------------------------  Chemistries   Recent Labs Lab 04/20/16 2306 04/21/16 0601  NA 135 134*  K 3.7 4.0  CL 99* 99*  CO2 25 21*  GLUCOSE 335* 424*  BUN 22* 23*  CREATININE 0.97 1.12*  CALCIUM 8.9 8.3*  AST 29  --   ALT 32  --   ALKPHOS 176*  --   BILITOT 0.7  --    ------------------------------------------------------------------------------------------------------------------  Cardiac Enzymes  Recent Labs Lab 04/20/16 2306  TROPONINI 0.03*   ------------------------------------------------------------------------------------------------------------------  RADIOLOGY:  Ct Head Wo Contrast  Result Date: 04/20/2016 CLINICAL DATA:  Initial evaluation for  acute severe headache. Hypertension. EXAM: CT HEAD WITHOUT CONTRAST TECHNIQUE: Contiguous axial images were obtained from the base of the skull through the vertex without intravenous contrast. COMPARISON:  Prior MRI from 12/30/2015. FINDINGS: Brain: Stable atrophy with mild chronic small vessel ischemic disease. No acute intracranial hemorrhage. No evidence for acute large vessel territory infarct. No mass lesion, midline shift or mass effect. No hydrocephalus. No extra-axial fluid collection. Vascular: No hyperdense vessel. Scattered vascular calcifications noted within the carotid siphons and distal vertebral arteries. Skull: Scalp soft tissues demonstrate no acute abnormality. Calvarium intact. Sinuses/Orbits: Visualized globes and oval soft tissues within normal limits. Mild scattered mucosal thickening within the visualized ethmoidal air cells. Paranasal sinuses are otherwise clear. Small left mastoid effusion noted. Right mastoid air cells clear. The IMPRESSION: 1. No acute intracranial process. 2. Mild age-related cerebral atrophy with chronic small vessel ischemic disease, stable. Electronically Signed   By: Jeannine Boga M.D.   On: 04/20/2016 23:35   Mr Brain Wo Contrast  Result Date: 04/21/2016 CLINICAL DATA:  61 y/o F; nausea, vomiting, headaches, and dizziness with hypertension. EXAM: MRI HEAD WITHOUT CONTRAST TECHNIQUE: Multiplanar, multiecho pulse sequences of the brain and surrounding structures were obtained without intravenous contrast. COMPARISON:  04/20/2016 CT head.  12/30/2015 MRI brain. FINDINGS: Brain: No acute infarction, hemorrhage, hydrocephalus, extra-axial collection or mass lesion. Stable foci of susceptibility hypointensity are present within right periatrial periventricular white matter compatible with hemosiderin deposition probably representing sequelae of old microhemorrhage. Few stable nonspecific foci of T2 FLAIR hyperintense signal abnormality are present in  periventricular and subcortical white matter, likely microvascular ischemic changes. Vascular: Normal flow voids. Skull and upper cervical spine: Normal marrow signal. Sinuses/Orbits: Patchy right ethmoid air cell opacification and small left mastoid effusion. Orbits are unremarkable. Other: None. IMPRESSION: 1. No acute intracranial abnormality identified. 2. Stable mild chronic microvascular ischemic changes of the brain. 3. Small left mastoid air cell effusion. Electronically Signed   By: Kristine Garbe M.D.   On: 04/21/2016 14:40   Ct Abdomen Pelvis W Contrast  Result Date: 04/21/2016 CLINICAL DATA:  Abdominal pain, nausea and vomiting, hyperglycemia, and hypertension. Abdominal swelling and headache. History of appendectomy and hysterectomy. Unable to tolerate oral contrast material. EXAM: CT ABDOMEN AND PELVIS WITH CONTRAST TECHNIQUE: Multidetector CT imaging of the abdomen and pelvis was performed using the standard protocol following bolus administration of intravenous contrast. CONTRAST:  155mL ISOVUE-300 IOPAMIDOL (ISOVUE-300) INJECTION 61% COMPARISON:  12/31/2015 FINDINGS: Lower chest: Dependent atelectasis in the lung bases. Small esophageal hiatal hernia. Hepatobiliary: Diffuse fatty infiltration of the liver. No focal lesions identified. Gallbladder and bile ducts are unremarkable. Pancreas: Unremarkable. No pancreatic ductal dilatation or surrounding inflammatory changes. Spleen: Normal in size without focal abnormality. Adrenals/Urinary Tract: Adrenal glands are unremarkable. Kidneys are normal, without renal calculi, focal lesion, or hydronephrosis. Bladder wall is diffusely thickened, suggesting cystitis. No filling defects. Stomach/Bowel: Stomach and small bowel are unremarkable. No evidence of abnormal distention. Scattered stool within the colon. Scattered colonic diverticula most prominent in the right colon. No colonic distention or wall thickening. Appendix is surgically absent.  Vascular/Lymphatic: Aortic atherosclerosis. No enlarged abdominal or pelvic lymph nodes. Reproductive: Status post hysterectomy. No adnexal masses. Other: No abdominal wall hernia or abnormality. No abdominopelvic ascites. Musculoskeletal: Degenerative changes in the spine. No destructive bone lesions. IMPRESSION: Diffuse fatty infiltration of the liver. Small esophageal hiatal hernia. Diffuse bladder wall thickening suggesting cystitis. No evidence of  bowel obstruction or inflammation. Electronically Signed   By: Lucienne Capers M.D.   On: 04/21/2016 03:06    ASSESSMENT AND PLAN:   Active Problems:   Hypertensive urgency  * Hypertensive urgency    After getting labetalol and hydralazine injections in ER- now BP running normal, we may have to hold some of her scheduled oral meds.   Resume oral meds    Bp coming under control.  * nausea and vomit    May be viral gastritis or peptic ulcer disease.   Supportive care.   MRI brain is negative for stroke.   IV PPI BID , IV zofran and phenergan.   Called GI consult.  * uncontrolled DM   Cont inuslin with meal and ISS.   Now under control.    All the records are reviewed and case discussed with Care Management/Social Workerr. Management plans discussed with the patient, family and they are in agreement.  CODE STATUS: Full  TOTAL TIME TAKING CARE OF THIS PATIENT: 35 minutes.     POSSIBLE D/C IN 1-2 DAYS, DEPENDING ON CLINICAL CONDITION.   Vaughan Basta M.D on 04/22/2016   Between 7am to 6pm - Pager - [phone]  After 6pm go to [email or web address] - password [email or web address] [facility]  Office  [phone]  CC: Primary care physician; Marden Noble, MD  Note: This dictation was prepared with Dragon dictation along with smaller phrase technology. Any transcriptional errors that result from this process are unintentional.

## 2016-04-23 DIAGNOSIS — R112 Nausea with vomiting, unspecified: Secondary | ICD-10-CM

## 2016-04-23 LAB — GLUCOSE, CAPILLARY
GLUCOSE-CAPILLARY: 115 mg/dL — AB (ref 65–99)
Glucose-Capillary: 166 mg/dL — ABNORMAL HIGH (ref 65–99)
Glucose-Capillary: 49 mg/dL — ABNORMAL LOW (ref 65–99)
Glucose-Capillary: 119 mg/dL — ABNORMAL HIGH (ref 65–99)
Glucose-Capillary: 288 mg/dL — ABNORMAL HIGH (ref 65–99)

## 2016-04-23 LAB — URINALYSIS, COMPLETE (UACMP) WITH MICROSCOPIC
Bilirubin Urine: NEGATIVE
Glucose, UA: 150 mg/dL — AB
KETONES UR: 5 mg/dL — AB
NITRITE: NEGATIVE
PH: 6 (ref 5.0–8.0)
Protein, ur: 100 mg/dL — AB
SPECIFIC GRAVITY, URINE: 1.006 (ref 1.005–1.030)

## 2016-04-23 NOTE — Progress Notes (Signed)
[facility] NAME: Karen [name]    MR#:  [id number]  DATE OF BIRTH:  January 08, 1955  SUBJECTIVE:   Pt. Here due to hypertensive urgency and intractable N/V.  BP improved but still continues to have N/V. CT abd/pelvis on admission (-) for acute pathology.   REVIEW OF SYSTEMS:    Review of Systems  Constitutional: Negative for chills and fever.  HENT: Negative for congestion and tinnitus.   Eyes: Negative for blurred vision and double vision.  Respiratory: Negative for cough, shortness of breath and wheezing.   Cardiovascular: Negative for chest pain, orthopnea and PND.  Gastrointestinal: Positive for nausea and vomiting. Negative for abdominal pain and diarrhea.  Genitourinary: Negative for dysuria and hematuria.  Neurological: Negative for dizziness, sensory change and focal weakness.  All other systems reviewed and are negative.   Nutrition: Heart Healthy/Carb control Tolerating Diet: No  Tolerating PT: Ambulatory     DRUG ALLERGIES:   Allergies  Allergen Reactions  . Penicillins Anaphylaxis    Has patient had a PCN reaction causing immediate rash, facial/tongue/throat swelling, SOB or lightheadedness with hypotension: Yes Has patient had a PCN reaction causing severe rash involving mucus membranes or skin necrosis: No Has patient had a PCN reaction that required hospitalization No Has patient had a PCN reaction occurring within the last 10 years: No If all of the above answers are "NO", then may proceed with Cephalosporin use.  . Reglan [Metoclopramide] Other (See Comments)    Hypotension, shortness of breath  . Percocet [Oxycodone-Acetaminophen] Rash    VITALS:  Blood pressure (!) 100/56, pulse 80, temperature 97.9 F (36.6 C), temperature source Oral, resp. rate 14, height 5\' 1"  (1.549 m), weight 50 kg (110 lb 3.2 oz), SpO2 97 %.  PHYSICAL EXAMINATION:   Physical Exam  GENERAL:  61 y.o.-year-old patient lying in bed  in no acute distress.  EYES: Pupils equal, round, reactive to light and accommodation. No scleral icterus. Extraocular muscles intact.  HEENT: Head atraumatic, normocephalic. Oropharynx and nasopharynx clear.  NECK:  Supple, no jugular venous distention. No thyroid enlargement, no tenderness.  LUNGS: Normal breath sounds bilaterally, no wheezing, rales, rhonchi. No use of accessory muscles of respiration.  CARDIOVASCULAR: S1, S2 normal. No murmurs, rubs, or gallops.  ABDOMEN: Soft, Tender, No rebound, rigidity nondistended. Bowel sounds present. No organomegaly or mass.  EXTREMITIES: No cyanosis, clubbing or edema b/l.    NEUROLOGIC: Cranial nerves II through XII are intact. No focal Motor or sensory deficits b/l.   PSYCHIATRIC: The patient is alert and oriented x 3.  SKIN: No obvious rash, lesion, or ulcer.    LABORATORY PANEL:   CBC  Recent Labs Lab 04/21/16 0601  WBC 9.7  HGB 11.7*  HCT 34.2*  PLT 207   ------------------------------------------------------------------------------------------------------------------  Chemistries   Recent Labs Lab 04/20/16 2306 04/21/16 0601  NA 135 134*  K 3.7 4.0  CL 99* 99*  CO2 25 21*  GLUCOSE 335* 424*  BUN 22* 23*  CREATININE 0.97 1.12*  CALCIUM 8.9 8.3*  AST 29  --   ALT 32  --   ALKPHOS 176*  --   BILITOT 0.7  --    ------------------------------------------------------------------------------------------------------------------  Cardiac Enzymes  Recent Labs Lab 04/20/16 2306  TROPONINI 0.03*   ------------------------------------------------------------------------------------------------------------------  RADIOLOGY:  No results found.   ASSESSMENT AND PLAN:   61 year old female with past medical history of diabetes, hypertension, hyperlipidemia, depression who presented to the hospital due to nausea vomiting  and noted to have hypertensive urgency.  1. Hypertensive urgency-secondary to patient's medical  noncompliance. -Blood pressure much improved now. Continue lisinopril, metoprolol, as needed hydralazine.  2. Intractable nausea and vomiting-suspected to be secondary to the underlying hypertensive urgency but blood pressure is improved she continues to have nausea vomiting. Continue IV Zofran and Phenergan as needed.  -CT scan abdomen pelvis for acute pathology other than hepatic steatosis. Await gastroenterology input suspected gastroparesis with patient is allergic to Reglan.  3. Diabetes type 2 without complication-continue NovoLog Mix 70/30 mix, sliding scale insulin. -Hypoglycemic this morning but asymptomatic. Will consult Diabetes Coordinator.   4. GERD - cont. Protonix.   5. Hx of Migraines - cont. Fiorcet.   All the records are reviewed and case discussed with Care Management/Social Worker. Management plans discussed with the patient, family and they are in agreement.  CODE STATUS: Full code  DVT Prophylaxis: Lovenox  TOTAL TIME TAKING CARE OF THIS PATIENT: 30 minutes.   POSSIBLE D/C IN 1-2 DAYS, DEPENDING ON CLINICAL CONDITION.   Henreitta Leber M.D on 04/23/2016 at 5:18 PM  Between 7am to 6pm - Pager - [phone]  After 6pm go to [email or web address] - Proofreader  [facility]  Office  [phone]  CC: Primary care physician; Marden Noble, MD

## 2016-04-23 NOTE — Consult Note (Signed)
Karen Lame, MD [facility]  [address], [place], [place] [address] Phone: [phone] Fax : [phone]  Consultation  Referring Provider:     Dr. Verdell Carmine Primary Care Physician:  Marden Noble, MD Primary Gastroenterologist:       Reason for Consultation:     Nausea and vomiting  Date of Admission:  04/20/2016 Date of Consultation:  04/23/2016         HPI:   Karen [name] is a 61 y.o. female Is admitted with hypertensive urgency and a history of nausea and vomiting.  The patient states she usually has nausea vomiting quite often.  The patient states that it got worse 4 days ago when she started to have nausea and vomiting that would not stop.  The patient states that she drinks approximately a case of beer per week.  She has been doing this for some time.  The patient denies ever being seen by a gastrologist in the past and denies ever being told that she should have a screening colonoscopy.  The patient is now reporting that she ate ice cream today and had soup without vomiting but states that she felt nauseous.  The patient has a history of type 2 diabetes.  The patient also reports that she has a history of heartburn.  There is no report of any unexplained weight loss and the patient was found on imaging to have fatty liver.  Past Medical History:  Diagnosis Date  . Alcoholic pancreatitis   . Asthma   . Depression   . Diabetes mellitus without complication ([place])   . DKA (diabetic ketoacidoses) ([place]) 02/17/2015  . Emphysematous cystitis   . Hypercholesteremia   . Hypertension   . Hypokalemia     Past Surgical History:  Procedure Laterality Date  . ABDOMINAL HYSTERECTOMY  1996  . APPENDECTOMY  1997  . HAND SURGERY  1988  . THYROID SURGERY  2013    Prior to Admission medications   Medication Sig Start Date End Date Taking? Authorizing Provider  atorvastatin (LIPITOR) 10 MG tablet Take 10 mg by mouth daily at 6 PM.   Yes Historical Provider, MD  glimepiride  (AMARYL) 2 MG tablet Take 2 mg by mouth daily with breakfast.   Yes Historical Provider, MD  sertraline (ZOLOFT) 50 MG tablet Take 50 mg by mouth daily.   Yes Historical Provider, MD  traZODone (DESYREL) 50 MG tablet Take 50 mg by mouth at bedtime.   Yes Historical Provider, MD  amitriptyline (ELAVIL) 25 MG tablet Take 1 tablet (25 mg total) by mouth at bedtime. 11/01/15   Srikar Sudini, MD  amLODipine (NORVASC) 10 MG tablet Take 1 tablet (10 mg total) by mouth daily. 12/13/15   Demetrios Loll, MD  butalbital-acetaminophen-caffeine (FIORICET, ESGIC) [id number] MG tablet Take 2 tablets by mouth every 12 (twelve) hours as needed for headache. 11/01/15   Srikar Sudini, MD  ciprofloxacin (CIPRO) 500 MG tablet Take 1 tablet (500 mg total) by mouth 2 (two) times daily. 01/01/16   Demetrios Loll, MD  insulin aspart protamine - aspart (NOVOLOG MIX 70/30 FLEXPEN) (70-30) 100 UNIT/ML FlexPen Inject 15 Units into the skin 2 (two) times daily.    Historical Provider, MD  lisinopril (PRINIVIL,ZESTRIL) 40 MG tablet Take 1 tablet (40 mg total) by mouth daily. 11/01/15   Srikar Sudini, MD  metoCLOPramide (REGLAN) 5 MG tablet Take 1 tablet (5 mg total) by mouth 3 (three) times daily before meals. 11/01/15   Hillary Bow, MD  metoprolol (LOPRESSOR) 50 MG tablet Take 1 tablet (50 mg total) by mouth 2 (two) times daily. 12/12/15   Demetrios Loll, MD  oxyCODONE-acetaminophen (PERCOCET) 5-325 MG tablet Take 2 tablets by mouth every 6 (six) hours as needed for moderate pain or severe pain. 05/14/15   Earleen Newport, MD  pantoprazole (PROTONIX) 40 MG tablet Take 1 tablet (40 mg total) by mouth daily. 11/01/15   Hillary Bow, MD    Family History  Problem Relation Age of Onset  . Family history unknown: Yes     Social History  Substance Use Topics  . Smoking status: Former Research scientist (life sciences)  . Smokeless tobacco: Never Used  . Alcohol use Yes     Comment: daily    Allergies as of 04/20/2016 - Review Complete 04/20/2016  Allergen  Reaction Noted  . Penicillins Anaphylaxis 05/16/2014  . Reglan [metoclopramide] Other (See Comments) 12/10/2015  . Percocet [oxycodone-acetaminophen] Rash 05/16/2014    Review of Systems:    All systems reviewed and negative except where noted in HPI.   Physical Exam:  Vital signs in last 24 hours: Temp:  [97.9 F (36.6 C)-99.2 F (37.3 C)] 97.9 F (36.6 C) (04/19 0814) Pulse Rate:  [80-91] 80 (04/19 1404) Resp:  [14-18] 14 (04/19 1350) BP: (100-173)/(56-80) 100/56 (04/19 1404) SpO2:  [93 %-100 %] 97 % (04/19 1350) Last BM Date: 04/20/16 General:   Pleasant, cooperative in NAD Head:  Normocephalic and atraumatic. Eyes:   No icterus.   Conjunctiva pink. PERRLA. Ears:  Normal auditory acuity. Neck:  Supple; no masses or thyroidomegaly Lungs: Respirations even and unlabored. Lungs clear to auscultation bilaterally.   No wheezes, crackles, or rhonchi.  Heart:  Regular rate and rhythm;  Without murmur, clicks, rubs or gallops Abdomen:  Soft, nondistended, nontender. Normal bowel sounds. No appreciable masses or hepatomegaly.  No rebound or guarding.  Rectal:  Not performed. Msk:  Symmetrical without gross deformities.    Extremities:  Without edema, cyanosis or clubbing. Neurologic:  Alert and oriented x3;  grossly normal neurologically. Skin:  Intact without significant lesions or rashes. Cervical Nodes:  No significant cervical adenopathy. Psych:  Alert and cooperative. Normal affect.  LAB RESULTS:  Recent Labs  04/20/16 2306 04/21/16 0601  WBC 8.9 9.7  HGB 12.1 11.7*  HCT 34.7* 34.2*  PLT 196 207   BMET  Recent Labs  04/20/16 2306 04/21/16 0601  NA 135 134*  K 3.7 4.0  CL 99* 99*  CO2 25 21*  GLUCOSE 335* 424*  BUN 22* 23*  CREATININE 0.97 1.12*  CALCIUM 8.9 8.3*   LFT  Recent Labs  04/20/16 2306  PROT 8.4*  ALBUMIN 3.9  AST 29  ALT 32  ALKPHOS 176*  BILITOT 0.7   PT/INR No results for input(s): LABPROT, INR in the last 72  hours.  STUDIES: No results found.    Impression / Plan:   Karen [name] is a 61 y.o. y/o female with a history of alcohol abuse and fatty liver who now comes in with nausea and vomiting for the last 4 days.  The patient was able to keep down some soup and ice cream today.  She also is a poor historian and is hard to get information from her.  The patient reports that she has been drinking since she was a teenager.  She also reports that she's had chronic vomiting a few times a week to a few times a month. She reports this intermittent vomiting to be present for many  years.  The patient will be set up for an upper endoscopy for tomorrow to look for any gastric outlet obstruction due to her report of taking Advil approximately 3 times a week. I have discussed risks & benefits which include, but are not limited to, bleeding, infection, perforation & drug reaction.  The patient agrees with this plan & written consent will be obtained.     Thank you for involving me in the care of this patient.      LOS: 0 days   Karen Lame, MD  04/23/2016, 7:01 PM   Note: This dictation was prepared with Dragon dictation along with smaller phrase technology. Any transcriptional errors that result from this process are unintentional.

## 2016-04-24 ENCOUNTER — Observation Stay: Payer: Medicaid Other | Admitting: Anesthesiology

## 2016-04-24 ENCOUNTER — Encounter
Admission: EM | Disposition: A | Discharge status: Home / Self Care | Payer: Self-pay | Source: Home / Self Care | Attending: Emergency Medicine

## 2016-04-24 ENCOUNTER — Encounter: Payer: Self-pay | Admitting: *Deleted

## 2016-04-24 DIAGNOSIS — B3781 Candidal esophagitis: Secondary | ICD-10-CM | POA: Diagnosis not present

## 2016-04-24 DIAGNOSIS — R112 Nausea with vomiting, unspecified: Secondary | ICD-10-CM | POA: Diagnosis not present

## 2016-04-24 HISTORY — PX: ESOPHAGOGASTRODUODENOSCOPY (EGD) WITH PROPOFOL: SHX5813

## 2016-04-24 LAB — BASIC METABOLIC PANEL
ANION GAP: 6 (ref 5–15)
BUN: 23 mg/dL — AB (ref 6–20)
CHLORIDE: 106 mmol/L (ref 101–111)
CO2: 27 mmol/L (ref 22–32)
Calcium: 8.2 mg/dL — ABNORMAL LOW (ref 8.9–10.3)
Creatinine, Ser: 1.13 mg/dL — ABNORMAL HIGH (ref 0.44–1.00)
GFR calc Af Amer: >60 mL/min (ref >60)
GFR, EST NON AFRICAN AMERICAN: 52 mL/min — AB (ref >60)
GLUCOSE: 209 mg/dL — AB (ref 65–99)
POTASSIUM: 4.1 mmol/L (ref 3.5–5.1)
Sodium: 139 mmol/L (ref 135–145)

## 2016-04-24 LAB — KOH PREP
KOH PREP: NONE SEEN
SPECIAL REQUESTS: NORMAL

## 2016-04-24 LAB — GLUCOSE, CAPILLARY
GLUCOSE-CAPILLARY: 197 mg/dL — AB (ref 65–99)
GLUCOSE-CAPILLARY: 163 mg/dL — AB (ref 65–99)
Glucose-Capillary: 106 mg/dL — ABNORMAL HIGH (ref 65–99)

## 2016-04-24 SURGERY — ESOPHAGOGASTRODUODENOSCOPY (EGD) WITH PROPOFOL
Anesthesia: General

## 2016-04-24 MED ORDER — PROPOFOL 500 MG/50ML IV EMUL
INTRAVENOUS | Status: DC | PRN
  Administered 2016-04-24: 100 ug/kg/min via INTRAVENOUS

## 2016-04-24 MED ORDER — INSULIN ASPART 100 UNIT/ML ~~LOC~~ SOLN: 0.0000 [IU] | Freq: Three times a day (TID) | SUBCUTANEOUS | Status: DC

## 2016-04-24 MED ORDER — PROPOFOL 10 MG/ML IV BOLUS
INTRAVENOUS | Status: DC | PRN
  Administered 2016-04-24: 50 mg via INTRAVENOUS

## 2016-04-24 MED ORDER — PROPOFOL 500 MG/50ML IV EMUL
INTRAVENOUS | Status: AC
  Filled 2016-04-24: qty 50

## 2016-04-24 MED ORDER — ONDANSETRON HCL 4 MG/2ML IJ SOLN
INTRAMUSCULAR | Status: AC
  Filled 2016-04-24: qty 2

## 2016-04-24 MED ORDER — NYSTATIN 100000 UNIT/ML MT SUSP
5.0000 mL | Freq: Four times a day (QID) | OROMUCOSAL | Status: DC
  Administered 2016-04-24: 500000 [IU] via ORAL
  Filled 2016-04-24 (×5): qty 5

## 2016-04-24 MED ORDER — NYSTATIN 100000 UNIT/ML MT SUSP: 5.0000 mL | Freq: Four times a day (QID) | OROMUCOSAL | 0 refills | Status: AC

## 2016-04-24 NOTE — Progress Notes (Signed)
Inpatient Diabetes Program Recommendations  AACE/ADA: New Consensus Statement on Inpatient Glycemic Control (2015)  Target Ranges:  Prepandial:   less than 140 mg/dL      Peak postprandial:   less than 180 mg/dL (1-2 hours)      Critically ill patients:  140 - 180 mg/dL   Lab Results  Component Value Date   GLUCAP 197 (H) 04/24/2016   HGBA1C 9.7 (H) 10/29/2015   Results for ROANN, MERK (MRN [id number]) as of 04/24/2016 08:17  Ref. Range 04/23/2016 10:50 04/23/2016 11:49 04/23/2016 16:54 04/23/2016 21:12 04/24/2016 07:47  Glucose-Capillary Latest Ref Range: 65 - 99 mg/dL 49 (L) 119 (H) 288 (H) 115 (H) 197 (H)    Diabetes history:Type 2 Outpatient Diabetes medications: Novolog 70/30 pen-   15 units bid- confirmedand I saw the insulin pen that she had with her Current orders for Inpatient glycemic control: Novolog 70/30 15 units bid, Novolog moderate correction 0-9 units tid, Novolog 0-5 units qhs  Inpatient Diabetes Program Recommendations:   Consider decreasing Novolog correction to 0-9 units tid, continue Novolog 70/30 as ordered.  Gentry Fitz, RN, BA, MHA, CDE Diabetes Coordinator Inpatient Diabetes Program  [phone] (Team Pager) [phone] ([place]) 04/24/2016 12:17 PM

## 2016-04-24 NOTE — Anesthesia Postprocedure Evaluation (Signed)
Anesthesia Post Note  Patient: Karen [name]  Procedure(s) Performed: Procedure(s) (LRB): ESOPHAGOGASTRODUODENOSCOPY (EGD) WITH PROPOFOL (N/A)  Patient location during evaluation: PACU Anesthesia Type: General Level of consciousness: awake and alert and oriented Pain management: pain level controlled Vital Signs Assessment: post-procedure vital signs reviewed and stable Respiratory status: spontaneous breathing Cardiovascular status: blood pressure returned to baseline Anesthetic complications: no     Last Vitals:  Vitals:   04/24/16 1420 04/24/16 1458  BP: 101/65 (!) 151/70  Pulse: 85 75  Resp: 20 20  Temp:  36.8 C    Last Pain:  Vitals:   04/24/16 1507  TempSrc:   PainSc: 0-No pain                 [name]

## 2016-04-24 NOTE — Op Note (Signed)
[facility] Gastroenterology Patient Name: Karen [name] Procedure Date: 04/24/2016 1:10 PM MRN: [id number] Account #: [id number] Date of Birth: 12-18-1955 Admit Type: Outpatient Age: 61 Room: [facility] ENDO ROOM 4 Gender: Female Note Status: Finalized Procedure:            Upper GI endoscopy Indications:          Nausea with vomiting Providers:            [name] MD, MD Medicines:            Propofol per Anesthesia Complications:        No immediate complications. Procedure:            Pre-Anesthesia Assessment:                       - Prior to the procedure, a History and Physical was                        performed, and patient medications and allergies were                        reviewed. The patient's tolerance of previous                        anesthesia was also reviewed. The risks and benefits of                        the procedure and the sedation options and risks were                        discussed with the patient. All questions were                        answered, and informed consent was obtained. Prior                        Anticoagulants: The patient has taken no previous                        anticoagulant or antiplatelet agents. ASA Grade                        Assessment: II - A patient with mild systemic disease.                        After reviewing the risks and benefits, the patient was                        deemed in satisfactory condition to undergo the                        procedure.                       After obtaining informed consent, the endoscope was                        passed under direct vision. Throughout the procedure,                        the patient's blood pressure,  pulse, and oxygen                        saturations were monitored continuously. The Endoscope                        was introduced through the mouth, and advanced to the                        second part of duodenum. The upper GI endoscopy was                       accomplished without difficulty. The patient tolerated                        the procedure well. Findings:      Patchy candidiasis was found in the lower third of the esophagus. Cells       for cytology were obtained by brushing.      The stomach was normal.      The examined duodenum was normal. Impression:           - Monilial esophagitis. Cells for cytology obtained.                       - Normal stomach.                       - Normal examined duodenum. Recommendation:       - Return patient to hospital ward for ongoing care.                       - Await brushing results.                       - No cuase for the patients symptoms seen. Procedure Code(s):    --- Professional ---                       [phone], Esophagogastroduodenoscopy, flexible, transoral;                        diagnostic, including collection of specimen(s) by                        brushing or washing, when performed (separate procedure) Diagnosis Code(s):    --- Professional ---                       R11.2, Nausea with vomiting, unspecified                       B37.81, Candidal esophagitis CPT copyright 2016 American Medical Association. All rights reserved. The codes documented in this report are preliminary and upon coder review may  be revised to meet current compliance requirements. [name] MD, MD 04/24/2016 1:54:55 PM This report has been signed electronically. Number of Addenda: 0 Note Initiated On: 04/24/2016 1:10 PM      [facility]

## 2016-04-24 NOTE — Progress Notes (Signed)
Patient returned from EGD stable. Vitals assessed and documented. Patient denies pain. Patient A&Ox4.  Orders checked. MD aware of results. Patient's lunch ordered and as long as she tolerates it, she will go home.  Patient received Nystatin oral medication. Will continue to monitor.

## 2016-04-24 NOTE — Anesthesia Preprocedure Evaluation (Signed)
Anesthesia Evaluation  Patient identified by MRN, date of birth, ID band Patient awake    Reviewed: Allergy & Precautions, NPO status , Patient's Chart, lab work & pertinent test results, reviewed documented beta blocker date and time   Airway Mallampati: III  TM Distance: <3 FB     Dental  (+) Upper Dentures   Pulmonary asthma , former smoker,    Pulmonary exam normal        Cardiovascular hypertension, Pt. on medications and Pt. on home beta blockers Normal cardiovascular exam     Neuro/Psych PSYCHIATRIC DISORDERS Depression    GI/Hepatic GERD  ,Increased N/V   Endo/Other  diabetes, Well Controlled, Type 2, Oral Hypoglycemic Agents  Renal/GU Renal InsufficiencyRenal disease  negative genitourinary   Musculoskeletal negative musculoskeletal ROS (+)   Abdominal Normal abdominal exam  (+)   Peds negative pediatric ROS (+)  Hematology negative hematology ROS (+)   Anesthesia Other Findings   Reproductive/Obstetrics                             Anesthesia Physical Anesthesia Plan  ASA: III  Anesthesia Plan: General   Post-op Pain Management:    Induction: Intravenous  Airway Management Planned: Nasal Cannula  Additional Equipment:   Intra-op Plan:   Post-operative Plan:   Informed Consent: I have reviewed the patients History and Physical, chart, labs and discussed the procedure including the risks, benefits and alternatives for the proposed anesthesia with the patient or authorized representative who has indicated his/her understanding and acceptance.   Dental advisory given  Plan Discussed with: CRNA and Surgeon  Anesthesia Plan Comments:         Anesthesia Quick Evaluation

## 2016-04-24 NOTE — Progress Notes (Signed)
Patient discharged home per MD order and hospital protocol. Patient given her prescription for nystatin. Patient verbalized understanding of medications and discharge instructions. Patient escorted of the unit via staff.

## 2016-04-24 NOTE — Anesthesia Post-op Follow-up Note (Cosign Needed)
Anesthesia QCDR form completed.        

## 2016-04-24 NOTE — Anesthesia Procedure Notes (Signed)
Procedures

## 2016-04-24 NOTE — Transfer of Care (Signed)
Immediate Anesthesia Transfer of Care Note  Patient: Karen [name]  Procedure(s) Performed: Procedure(s): ESOPHAGOGASTRODUODENOSCOPY (EGD) WITH PROPOFOL (N/A)  Patient Location: PACU  Anesthesia Type:General  Level of Consciousness: awake, alert  and oriented  Airway & Oxygen Therapy: Patient Spontanous Breathing and Patient connected to nasal cannula oxygen  Post-op Assessment: Report given to RN and Post -op Vital signs reviewed and stable  Post vital signs: Reviewed and stable  Last Vitals:  Vitals:   04/24/16 1239 04/24/16 1400  BP: (!) 141/59   Pulse: 67 73  Resp: 16 18  Temp: (!) 36 C     Last Pain:  Vitals:   04/24/16 1239  TempSrc: Tympanic  PainSc:       Patients Stated Pain Goal: 4 (36/12/24 4975)  Complications: No apparent anesthesia complications

## 2016-04-24 NOTE — Progress Notes (Signed)
[place] at [place] NAME: Karen [name]    MR#:  [id number]  DATE OF BIRTH:  [birth year]  SUBJECTIVE:   Pt. Here due to hypertensive urgency and intractable N/V.  BP much improved and seen by GI and s/p Endoscopy today showing monolial esophagitis.    REVIEW OF SYSTEMS:    Review of Systems  Constitutional: Negative for chills and fever.  HENT: Negative for congestion and tinnitus.   Eyes: Negative for blurred vision and double vision.  Respiratory: Negative for cough, shortness of breath and wheezing.   Cardiovascular: Negative for chest pain, orthopnea and PND.  Gastrointestinal: Positive for nausea and vomiting. Negative for abdominal pain and diarrhea.  Genitourinary: Negative for dysuria and hematuria.  Neurological: Negative for dizziness, sensory change and focal weakness.  All other systems reviewed and are negative.   Nutrition: Heart Healthy/Carb control Tolerating Diet:  Yes Tolerating PT: Ambulatory     DRUG ALLERGIES:   Allergies  Allergen Reactions  . Penicillins Anaphylaxis    Has patient had a PCN reaction causing immediate rash, facial/tongue/throat swelling, SOB or lightheadedness with hypotension: Yes Has patient had a PCN reaction causing severe rash involving mucus membranes or skin necrosis: No Has patient had a PCN reaction that required hospitalization No Has patient had a PCN reaction occurring within the last 10 years: No If all of the above answers are "NO", then may proceed with Cephalosporin use.  . Reglan [Metoclopramide] Other (See Comments)    Hypotension, shortness of breath  . Percocet [Oxycodone-Acetaminophen] Rash    VITALS:  Blood pressure (!) 151/70, pulse 75, temperature 98.2 F (36.8 C), temperature source Oral, resp. rate 20, height 5\' 1"  (1.549 m), weight 50 kg (110 lb 3.2 oz), SpO2 100 %.  PHYSICAL EXAMINATION:   Physical Exam  GENERAL:  61 y.o.-year-old patient lying in bed in no  acute distress.  EYES: Pupils equal, round, reactive to light and accommodation. No scleral icterus. Extraocular muscles intact.  HEENT: Head atraumatic, normocephalic. Oropharynx and nasopharynx clear.  NECK:  Supple, no jugular venous distention. No thyroid enlargement, no tenderness.  LUNGS: Normal breath sounds bilaterally, no wheezing, rales, rhonchi. No use of accessory muscles of respiration.  CARDIOVASCULAR: S1, S2 normal. No murmurs, rubs, or gallops.  ABDOMEN: Soft, Tender, No rebound, rigidity nondistended. Bowel sounds present. No organomegaly or mass.  EXTREMITIES: No cyanosis, clubbing or edema b/l.    NEUROLOGIC: Cranial nerves II through XII are intact. No focal Motor or sensory deficits b/l.   PSYCHIATRIC: The patient is alert and oriented x 3.  SKIN: No obvious rash, lesion, or ulcer.    LABORATORY PANEL:   CBC  Recent Labs Lab 04/21/16 0601  WBC 9.7  HGB 11.7*  HCT 34.2*  PLT 207   ------------------------------------------------------------------------------------------------------------------  Chemistries   Recent Labs Lab 04/20/16 2306  04/24/16 0439  NA 135  < > 139  K 3.7  < > 4.1  CL 99*  < > 106  CO2 25  < > 27  GLUCOSE 335*  < > 209*  BUN 22*  < > 23*  CREATININE 0.97  < > 1.13*  CALCIUM 8.9  < > 8.2*  AST 29  --   --   ALT 32  --   --   ALKPHOS 176*  --   --   BILITOT 0.7  --   --   < > = values in this interval not displayed. ------------------------------------------------------------------------------------------------------------------  Cardiac Enzymes  Recent Labs Lab 04/20/16 2306  TROPONINI 0.03*   ------------------------------------------------------------------------------------------------------------------  RADIOLOGY:  No results found.   ASSESSMENT AND PLAN:   61 year old female with past medical history of diabetes, hypertension, hyperlipidemia, depression who presented to the hospital due to nausea vomiting and  noted to have hypertensive urgency.  1. Hypertensive urgency-secondary to patient's medical noncompliance. -Blood pressure much improved now. - Continue lisinopril, metoprolol, as needed hydralazine.  2. Intractable nausea and vomiting-suspected to be secondary to the underlying hypertensive urgency but blood pressure improved but she continues to have nausea vomiting. Continue IV Zofran and Phenergan as needed.  -CT scan abdomen pelvis for acute pathology other than hepatic steatosis.  -Seen by gastroenterology and status post escapade showing just monilial esophagitis. Started on nystatin swish and swallow.  3. Diabetes type 2 without complication-continue NovoLog Mix 70/30 mix, sliding scale insulin. - appreciate Diabetes Coordinator input.   4. GERD - cont. Protonix.   5. Hx of Migraines - cont. Fiorcet.   Possible d/c home later today if tolerating PO well after Endoscopy today.   All the records are reviewed and case discussed with Care Management/Social Worker. Management plans discussed with the patient, family and they are in agreement.  CODE STATUS: Full code  DVT Prophylaxis: Lovenox  TOTAL TIME TAKING CARE OF THIS PATIENT: 25 minutes.   POSSIBLE D/C IN 1-2 DAYS, DEPENDING ON CLINICAL CONDITION.   Henreitta Leber M.D on 04/24/2016 at 3:24 PM  Between 7am to 6pm - Pager - [phone]  After 6pm go to [email or web address] - Proofreader  [facility]  Office  [phone]  CC: Primary care physician; Marden Noble, MD

## 2016-04-25 NOTE — Discharge Summary (Signed)
[place] at [place] NAME: Karen [name]    MR#:  [id number]  DATE OF BIRTH:  January 12, 1955  DATE OF ADMISSION:  04/20/2016 ADMITTING PHYSICIAN: Saundra Shelling, MD  DATE OF DISCHARGE: 04/24/2016  5:26 PM  PRIMARY CARE PHYSICIAN: Marden Noble, MD    ADMISSION DIAGNOSIS:  Dizziness [R42] Hypertensive urgency [I16.0] Acute intractable headache, unspecified headache type [R51] Chronic pancreatitis, unspecified pancreatitis type ([place]) [K86.1]  DISCHARGE DIAGNOSIS:  Active Problems:   Hypertensive urgency   Intractable vomiting with nausea   Nausea and vomiting   Esophageal candidiasis ([place])   SECONDARY DIAGNOSIS:   Past Medical History:  Diagnosis Date  . Alcoholic pancreatitis   . Asthma   . Depression   . Diabetes mellitus without complication ([place])   . DKA (diabetic ketoacidoses) ([place]) 02/17/2015  . Emphysematous cystitis   . Hypercholesteremia   . Hypertension   . Hypokalemia     HOSPITAL COURSE:   61 year old female with past medical history of diabetes, hypertension, hyperlipidemia, depression who presented to the hospital due to nausea vomiting and noted to have hypertensive urgency.  1. Hypertensive urgency- this was secondary to patient's medical noncompliance. -Patient was admitted to the hospital given as needed IV hydralazine and her home antihypertensives were resumed. Her blood pressure has not significantly improved and after she is being discharged home.  2. Intractable nausea and vomiting- due to gastritis/esophagitis.  -CT scan abdomen pelvis on admission (-) for acute pathology other than hepatic steatosis.  -Patient was treated supportively with IV PPI, IV fluids and time medics. A gastroenterology consult was obtained, patient underwent an upper GI endoscopy which showed monilial esophagitis but no other acute pathology. Patient was discharged on nystatin swish and swallow on PPI. Her diet was slowly advanced post  endoscopy which she is tolerating now.  3. Diabetes type 2 without complication-patient will resume her Novolin 70/30 mix, she was noncompliant with her insulin prior to coming to the hospital.  4. GERD - she will cont. Protonix.   5. Hx of Migraines - she will cont. Fiorcet.   DISCHARGE CONDITIONS:   Stable.   CONSULTS OBTAINED:  Treatment Team:  [name], MD  DRUG ALLERGIES:   Allergies  Allergen Reactions  . Penicillins Anaphylaxis    Has patient had a PCN reaction causing immediate rash, facial/tongue/throat swelling, SOB or lightheadedness with hypotension: Yes Has patient had a PCN reaction causing severe rash involving mucus membranes or skin necrosis: No Has patient had a PCN reaction that required hospitalization No Has patient had a PCN reaction occurring within the last 10 years: No If all of the above answers are "NO", then may proceed with Cephalosporin use.  . Reglan [Metoclopramide] Other (See Comments)    Hypotension, shortness of breath  . Percocet [Oxycodone-Acetaminophen] Rash    DISCHARGE MEDICATIONS:   Allergies as of 04/24/2016      Reactions   Penicillins Anaphylaxis   Has patient had a PCN reaction causing immediate rash, facial/tongue/throat swelling, SOB or lightheadedness with hypotension: Yes Has patient had a PCN reaction causing severe rash involving mucus membranes or skin necrosis: No Has patient had a PCN reaction that required hospitalization No Has patient had a PCN reaction occurring within the last 10 years: No If all of the above answers are "NO", then may proceed with Cephalosporin use.   Reglan [metoclopramide] Other (See Comments)   Hypotension, shortness of breath   Percocet [oxycodone-acetaminophen] Rash  Medication List    STOP taking these medications   amLODipine 10 MG tablet Commonly known as:  NORVASC   ciprofloxacin 500 MG tablet Commonly known as:  CIPRO     TAKE these medications   amitriptyline 25 MG  tablet Commonly known as:  ELAVIL Take 1 tablet (25 mg total) by mouth at bedtime.   atorvastatin 10 MG tablet Commonly known as:  LIPITOR Take 10 mg by mouth daily at 6 PM.   butalbital-acetaminophen-caffeine 50-325-40 MG tablet Commonly known as:  FIORICET, ESGIC Take 2 tablets by mouth every 12 (twelve) hours as needed for headache.   glimepiride 2 MG tablet Commonly known as:  AMARYL Take 2 mg by mouth daily with breakfast.   lisinopril 40 MG tablet Commonly known as:  PRINIVIL,ZESTRIL Take 1 tablet (40 mg total) by mouth daily.   metoCLOPramide 5 MG tablet Commonly known as:  REGLAN Take 1 tablet (5 mg total) by mouth 3 (three) times daily before meals.   metoprolol 50 MG tablet Commonly known as:  LOPRESSOR Take 1 tablet (50 mg total) by mouth 2 (two) times daily.   NOVOLOG MIX 70/30 FLEXPEN (70-30) 100 UNIT/ML FlexPen Generic drug:  insulin aspart protamine - aspart Inject 15 Units into the skin 2 (two) times daily.   nystatin 100000 UNIT/ML suspension Commonly known as:  MYCOSTATIN Take 5 mLs (500,000 Units total) by mouth 4 (four) times daily.   oxyCODONE-acetaminophen 5-325 MG tablet Commonly known as:  PERCOCET Take 2 tablets by mouth every 6 (six) hours as needed for moderate pain or severe pain.   pantoprazole 40 MG tablet Commonly known as:  PROTONIX Take 1 tablet (40 mg total) by mouth daily.   sertraline 50 MG tablet Commonly known as:  ZOLOFT Take 50 mg by mouth daily.   traZODone 50 MG tablet Commonly known as:  DESYREL Take 50 mg by mouth at bedtime.         DISCHARGE INSTRUCTIONS:   DIET:  Cardiac diet and Diabetic diet  DISCHARGE CONDITION:  Stable  ACTIVITY:  Activity as tolerated  OXYGEN:  Home Oxygen: No.   Oxygen Delivery: room air  DISCHARGE LOCATION:  home   If you experience worsening of your admission symptoms, develop shortness of breath, life threatening emergency, suicidal or homicidal thoughts you must seek  medical attention immediately by calling 911 or calling your MD immediately  if symptoms less severe.  You Must read complete instructions/literature along with all the possible adverse reactions/side effects for all the Medicines you take and that have been prescribed to you. Take any new Medicines after you have completely understood and accpet all the possible adverse reactions/side effects.   Please note  You were cared for by a hospitalist during your hospital stay. If you have any questions about your discharge medications or the care you received while you were in the hospital after you are discharged, you can call the unit and asked to speak with the hospitalist on call if the hospitalist that took care of you is not available. Once you are discharged, your primary care physician will handle any further medical issues. Please note that NO REFILLS for any discharge medications will be authorized once you are discharged, as it is imperative that you return to your primary care physician (or establish a relationship with a primary care physician if you do not have one) for your aftercare needs so that they can reassess your need for medications and monitor your lab values.  DATA REVIEW:   CBC  Recent Labs Lab 04/21/16 0601  WBC 9.7  HGB 11.7*  HCT 34.2*  PLT 207    Chemistries   Recent Labs Lab 04/20/16 2306  04/24/16 0439  NA 135  < > 139  K 3.7  < > 4.1  CL 99*  < > 106  CO2 25  < > 27  GLUCOSE 335*  < > 209*  BUN 22*  < > 23*  CREATININE 0.97  < > 1.13*  CALCIUM 8.9  < > 8.2*  AST 29  --   --   ALT 32  --   --   ALKPHOS 176*  --   --   BILITOT 0.7  --   --   < > = values in this interval not displayed.  Cardiac Enzymes  Recent Labs Lab 04/20/16 2306  TROPONINI 0.03*    Microbiology Results  Results for orders placed or performed during the hospital encounter of 04/20/16  KOH prep     Status: None   Collection Time: 04/24/16  1:53 PM  Result Value Ref  Range Status   Specimen Description ESOPHAGUS  Final   Special Requests Normal  Final   KOH Prep NO YEAST OR FUNGAL ELEMENTS SEEN  Final   Report Status 04/24/2016 FINAL  Final    RADIOLOGY:  No results found.    Management plans discussed with the patient, family and they are in agreement.  CODE STATUS:  Code Status History    Date Active Date Inactive Code Status Order ID Comments User Context   04/21/2016  5:05 AM 04/24/2016  8:31 PM Full Code [id number]  Saundra Shelling, MD Inpatient   12/31/2015  7:39 AM 01/01/2016  8:08 PM Full Code [id number]  Saundra Shelling, MD Inpatient   12/10/2015 11:09 AM 12/12/2015  6:48 PM Full Code [id number]  Demetrios Loll, MD Inpatient   10/29/2015  3:45 PM 11/01/2015  4:23 PM Full Code [id number]  Theodoro Grist, MD Inpatient   02/17/2015  5:04 PM 02/19/2015  6:04 PM Full Code [id number]  Nicholes Mango, MD Inpatient      TOTAL TIME TAKING CARE OF THIS PATIENT: 40 minutes.    Henreitta Leber M.D on 04/25/2016 at 2:43 PM  Between 7am to 6pm - Pager - [phone]  After 6pm go to [email or web address] - Proofreader  [facility]  Office  [phone]  CC: Primary care physician; Marden Noble, MD

## 2016-04-27 ENCOUNTER — Encounter: Payer: Self-pay | Admitting: Gastroenterology

## 2016-06-11 ENCOUNTER — Emergency Department
Admission: EM | Admit: 2016-06-11 | Discharge: 2016-06-11 | Disposition: A | Discharge status: Home / Self Care | Payer: Medicaid Other | Attending: Emergency Medicine | Admitting: Emergency Medicine

## 2016-06-11 ENCOUNTER — Emergency Department: Payer: Medicaid Other

## 2016-06-11 DIAGNOSIS — J45909 Unspecified asthma, uncomplicated: Secondary | ICD-10-CM | POA: Insufficient documentation

## 2016-06-11 DIAGNOSIS — Z87891 Personal history of nicotine dependence: Secondary | ICD-10-CM | POA: Insufficient documentation

## 2016-06-11 DIAGNOSIS — E119 Type 2 diabetes mellitus without complications: Secondary | ICD-10-CM | POA: Insufficient documentation

## 2016-06-11 DIAGNOSIS — R1084 Generalized abdominal pain: Secondary | ICD-10-CM | POA: Diagnosis present

## 2016-06-11 DIAGNOSIS — B349 Viral infection, unspecified: Secondary | ICD-10-CM | POA: Insufficient documentation

## 2016-06-11 DIAGNOSIS — Z794 Long term (current) use of insulin: Secondary | ICD-10-CM | POA: Insufficient documentation

## 2016-06-11 DIAGNOSIS — I10 Essential (primary) hypertension: Secondary | ICD-10-CM | POA: Diagnosis not present

## 2016-06-11 LAB — CBC
HEMATOCRIT: 37.4 % (ref 35.0–47.0)
HEMOGLOBIN: 13 g/dL (ref 12.0–16.0)
MCH: 30.8 pg (ref 26.0–34.0)
MCHC: 34.7 g/dL (ref 32.0–36.0)
MCV: 88.7 fL (ref 80.0–100.0)
Platelets: 192 10*3/uL (ref 150–440)
RBC: 4.21 MIL/uL (ref 3.80–5.20)
RDW: 11.9 % (ref 11.5–14.5)
WBC: 6.5 10*3/uL (ref 3.6–11.0)

## 2016-06-11 LAB — COMPREHENSIVE METABOLIC PANEL
ALBUMIN: 4.1 g/dL (ref 3.5–5.0)
ALT: 27 U/L (ref 14–54)
ANION GAP: 9 (ref 5–15)
AST: 39 U/L (ref 15–41)
Alkaline Phosphatase: 147 U/L — ABNORMAL HIGH (ref 38–126)
BUN: 24 mg/dL — AB (ref 6–20)
CHLORIDE: 100 mmol/L — AB (ref 101–111)
CO2: 26 mmol/L (ref 22–32)
Calcium: 9.6 mg/dL (ref 8.9–10.3)
Creatinine, Ser: 0.95 mg/dL (ref 0.44–1.00)
GFR calc Af Amer: >60 mL/min (ref >60)
GFR calc non Af Amer: >60 mL/min (ref >60)
GLUCOSE: 347 mg/dL — AB (ref 65–99)
POTASSIUM: 4.6 mmol/L (ref 3.5–5.1)
Sodium: 135 mmol/L (ref 135–145)
TOTAL PROTEIN: 8.2 g/dL — AB (ref 6.5–8.1)
Total Bilirubin: 1 mg/dL (ref 0.3–1.2)

## 2016-06-11 LAB — URINALYSIS, COMPLETE (UACMP) WITH MICROSCOPIC
BILIRUBIN URINE: NEGATIVE
Glucose, UA: >=500 mg/dL — AB
KETONES UR: NEGATIVE mg/dL
LEUKOCYTES UA: NEGATIVE
NITRITE: NEGATIVE
PH: 6 (ref 5.0–8.0)
Protein, ur: 100 mg/dL — AB
SPECIFIC GRAVITY, URINE: 1.011 (ref 1.005–1.030)

## 2016-06-11 LAB — LIPASE, BLOOD: LIPASE: 69 U/L — AB (ref 11–51)

## 2016-06-11 MED ORDER — DICYCLOMINE HCL 20 MG PO TABS: 20.0000 mg | ORAL_TABLET | Freq: Three times a day (TID) | ORAL | 0 refills | Status: DC | PRN

## 2016-06-11 MED ORDER — IOPAMIDOL (ISOVUE-300) INJECTION 61%
100.0000 mL | Freq: Once | INTRAVENOUS | Status: AC | PRN
  Administered 2016-06-11: 100 mL via INTRAVENOUS

## 2016-06-11 MED ORDER — DICYCLOMINE HCL 10 MG PO CAPS
10.0000 mg | ORAL_CAPSULE | Freq: Once | ORAL | Status: AC
  Administered 2016-06-11: 10 mg via ORAL
  Filled 2016-06-11: qty 1

## 2016-06-11 MED ORDER — ONDANSETRON 4 MG PO TBDP: 4.0000 mg | ORAL_TABLET | Freq: Three times a day (TID) | ORAL | 0 refills | Status: DC | PRN

## 2016-06-11 MED ORDER — NAPROXEN 500 MG PO TABS: 500.0000 mg | ORAL_TABLET | Freq: Two times a day (BID) | ORAL | 0 refills | Status: DC

## 2016-06-11 MED ORDER — SODIUM CHLORIDE 0.9 % IV BOLUS (SEPSIS)
1000.0000 mL | Freq: Once | INTRAVENOUS | Status: AC
  Administered 2016-06-11: 1000 mL via INTRAVENOUS

## 2016-06-11 MED ORDER — FAMOTIDINE 20 MG PO TABS: 20.0000 mg | ORAL_TABLET | Freq: Two times a day (BID) | ORAL | 0 refills | Status: DC

## 2016-06-11 MED ORDER — HYDROMORPHONE HCL 1 MG/ML IJ SOLN
0.5000 mg | Freq: Once | INTRAMUSCULAR | Status: AC
Start: 2016-06-11 — End: 2016-06-11
  Administered 2016-06-11: 0.5 mg via INTRAVENOUS
  Filled 2016-06-11: qty 1

## 2016-06-11 MED ORDER — ONDANSETRON HCL 4 MG/2ML IJ SOLN
4.0000 mg | Freq: Once | INTRAMUSCULAR | Status: AC
  Administered 2016-06-11: 4 mg via INTRAVENOUS
  Filled 2016-06-11: qty 2

## 2016-06-11 NOTE — Discharge Instructions (Signed)
Your blood tests today were unremarkable. Your CT scan also does not show any serious concerns. Your symptoms appear to be due to a viral illness which will pass on its own. Take medications as prescribed to control your symptoms.

## 2016-06-11 NOTE — ED Notes (Signed)
Patient transported to CT 

## 2016-06-11 NOTE — ED Notes (Signed)
Pt presents with abdominal pain since last night; had some vomiting yesterday but none today. Last BM yesterday; denies diarrhea. Pt states she has had some trouble with her bp and glucose levels lately. Pt did not take medications today. NAD noted.

## 2016-06-11 NOTE — ED Provider Notes (Signed)
[facility] Emergency Department Provider Note  ____________________________________________  Time seen: Approximately 1:28 PM  I have reviewed the triage vital signs and the nursing notes.   HISTORY  Chief Complaint Abdominal Pain    HPI Karen [name] is a 61 y.o. female who complains of generalized abdominal pain since last night, gradual onset, constant, worsening. Waxing and waning. Had some vomiting last night as well. Decreased appetite and oral in today. Pain is worse in the left side. Nonradiating. No aggravating or alleviating factors. No dysuria.     Past Medical History:  Diagnosis Date  . Alcoholic pancreatitis   . Asthma   . Depression   . Diabetes mellitus without complication ([place])   . DKA (diabetic ketoacidoses) ([place]) 02/17/2015  . Emphysematous cystitis   . Hypercholesteremia   . Hypertension   . Hypokalemia      Patient Active Problem List   Diagnosis Date Noted  . Nausea and vomiting   . Esophageal candidiasis ([place])   . Intractable vomiting with nausea   . Hypertensive urgency 04/21/2016  . Abdominal pain   . Acute renal failure ([place])   . Nausea vomiting and diarrhea   . Smoker   . Poorly controlled type 2 diabetes mellitus ([place])   . Acute renal insufficiency 10/29/2015  . Elevated troponin 10/29/2015  . Urinary tract infection without hematuria 10/29/2015  . Left flank pain 10/29/2015  . Malignant essential hypertension 10/29/2015  . DKA (diabetic ketoacidoses) ([place]) 02/17/2015  . Emphysematous cystitis      Past Surgical History:  Procedure Laterality Date  . ABDOMINAL HYSTERECTOMY  1996  . APPENDECTOMY  1997  . ESOPHAGOGASTRODUODENOSCOPY (EGD) WITH PROPOFOL N/A 04/24/2016   Procedure: ESOPHAGOGASTRODUODENOSCOPY (EGD) WITH PROPOFOL;  Surgeon: [name], MD;  Location: [facility] ENDOSCOPY;  Service: Endoscopy;  Laterality: N/A;  . HAND SURGERY  1988  . THYROID SURGERY  2013     Prior to Admission medications    Medication Sig Start Date End Date Taking? Authorizing Provider  atorvastatin (LIPITOR) 10 MG tablet Take 10 mg by mouth daily at 6 PM.   Yes [provider]  diphenhydrAMINE (BENADRYL) 25 MG tablet Take 25 mg by mouth every 6 (six) hours as needed.   Yes [provider]  ibuprofen (ADVIL,MOTRIN) 800 MG tablet Take 800 mg by mouth every 8 (eight) hours as needed for mild pain or moderate pain.   Yes [provider]  insulin aspart protamine - aspart (NOVOLOG MIX 70/30 FLEXPEN) (70-30) 100 UNIT/ML FlexPen Inject 15 Units into the skin 2 (two) times daily.   Yes [provider]  lisinopril (PRINIVIL,ZESTRIL) 40 MG tablet Take 1 tablet (40 mg total) by mouth daily. 11/01/15  Yes Sudini, Alveta Heimlich, MD  amitriptyline (ELAVIL) 25 MG tablet Take 1 tablet (25 mg total) by mouth at bedtime. Patient not taking: Reported on 06/11/2016 11/01/15   Hillary Bow, MD  butalbital-acetaminophen-caffeine (FIORICET, ESGIC) [phone] MG tablet Take 2 tablets by mouth every 12 (twelve) hours as needed for headache. Patient not taking: Reported on 06/11/2016 11/01/15   Hillary Bow, MD  dicyclomine (BENTYL) 20 MG tablet Take 1 tablet (20 mg total) by mouth 3 (three) times daily as needed for spasms. 06/11/16   Carrie Mew, MD  famotidine (PEPCID) 20 MG tablet Take 1 tablet (20 mg total) by mouth 2 (two) times daily. 06/11/16   Carrie Mew, MD  metoCLOPramide (REGLAN) 5 MG tablet Take 1 tablet (5 mg total) by mouth 3 (three) times daily before meals.  Patient not taking: Reported on 06/11/2016 11/01/15   Hillary Bow, MD  metoprolol (LOPRESSOR) 50 MG tablet Take 1 tablet (50 mg total) by mouth 2 (two) times daily. Patient not taking: Reported on 06/11/2016 12/12/15   Demetrios Loll, MD  naproxen (NAPROSYN) 500 MG tablet Take 1 tablet (500 mg total) by mouth 2 (two) times daily with a meal. 06/11/16   Carrie Mew, MD  ondansetron (ZOFRAN ODT) 4 MG disintegrating tablet Take 1 tablet (4  mg total) by mouth every 8 (eight) hours as needed for nausea or vomiting. 06/11/16   Carrie Mew, MD  oxyCODONE-acetaminophen (PERCOCET) 5-325 MG tablet Take 2 tablets by mouth every 6 (six) hours as needed for moderate pain or severe pain. 05/14/15   Earleen Newport, MD  pantoprazole (PROTONIX) 40 MG tablet Take 1 tablet (40 mg total) by mouth daily. Patient not taking: Reported on 06/11/2016 11/01/15   Hillary Bow, MD  traZODone (DESYREL) 50 MG tablet Take 50 mg by mouth at bedtime.    [provider]     Allergies Penicillins; Reglan [metoclopramide]; and Percocet [oxycodone-acetaminophen]   Family History  Problem Relation Age of Onset  . Family history unknown: Yes    Social History Social History  Substance Use Topics  . Smoking status: Former Research scientist (life sciences)  . Smokeless tobacco: Never Used  . Alcohol use Yes     Comment: daily    Review of Systems  Constitutional:   No fever or chills.  ENT:   No sore throat. No rhinorrhea. Cardiovascular:   No chest pain or syncope. Respiratory:   No dyspnea or cough. Gastrointestinal:  Positive as above for abdominal pain. Positive vomiting. No diarrhea.  Musculoskeletal:   Negative for focal pain or swelling All other systems reviewed and are negative except as documented above in ROS and HPI.  ____________________________________________   PHYSICAL EXAM:  VITAL SIGNS: ED Triage Vitals  Enc Vitals Group     BP 06/11/16 0645 (!) 193/89     Pulse Rate 06/11/16 0645 93     Resp 06/11/16 0645 18     Temp 06/11/16 0645 98.3 F (36.8 C)     Temp Source 06/11/16 0645 Oral     SpO2 06/11/16 0645 100 %     Weight 06/11/16 0646 110 lb (49.9 kg)     Height 06/11/16 0646 5\' 1"  (1.549 m)     Head Circumference --      Peak Flow --      Pain Score 06/11/16 0645 9     Pain Loc --      Pain Edu? --      Excl. in [place]? --     Vital signs reviewed, nursing assessments reviewed.   Constitutional:   Alert and oriented.  Well appearing and in no distress. Eyes:   No scleral icterus.  EOMI. No nystagmus. No conjunctival pallor. PERRL. ENT   Head:   Normocephalic and atraumatic.   Nose:   No congestion/rhinnorhea.    Mouth/Throat:   MMM, no pharyngeal erythema. No peritonsillar mass.    Neck:   No meningismus. Full ROM Hematological/Lymphatic/Immunilogical:   No cervical lymphadenopathy. Cardiovascular:   RRR. Symmetric bilateral radial and DP pulses.  No murmurs.  Respiratory:   Normal respiratory effort without tachypnea/retractions. Breath sounds are clear and equal bilaterally. No wheezes/rales/rhonchi. Gastrointestinal:   Soft with left lower quadrant and left mid abdominal tenderness. Non distended. There is no CVA tenderness.  No rebound, rigidity, or guarding. Genitourinary:  deferred Musculoskeletal:   Normal range of motion in all extremities. No joint effusions.  No lower extremity tenderness.  No edema. Neurologic:   Normal speech and language.  Motor grossly intact. No gross focal neurologic deficits are appreciated.  Skin:    Skin is warm, dry and intact. No rash noted.  No petechiae, purpura, or bullae.  ____________________________________________    LABS (pertinent positives/negatives) (all labs ordered are listed, but only abnormal results are displayed) Labs Reviewed  LIPASE, BLOOD - Abnormal; Notable for the following:       Result Value   Lipase 69 (*)    All other components within normal limits  COMPREHENSIVE METABOLIC PANEL - Abnormal; Notable for the following:    Chloride 100 (*)    Glucose, Bld 347 (*)    BUN 24 (*)    Total Protein 8.2 (*)    Alkaline Phosphatase 147 (*)    All other components within normal limits  URINALYSIS, COMPLETE (UACMP) WITH MICROSCOPIC - Abnormal; Notable for the following:    Color, Urine YELLOW (*)    APPearance CLEAR (*)    Glucose, UA >=500 (*)    Hgb urine dipstick SMALL (*)    Protein, ur 100 (*)    Bacteria, UA RARE (*)     Squamous Epithelial / LPF 0-5 (*)    All other components within normal limits  CBC   ____________________________________________   EKG    ____________________________________________    RADIOLOGY  Ct Abdomen Pelvis W Contrast  Result Date: 06/11/2016 CLINICAL DATA:  Left lower quadrant abdominal pain. EXAM: CT ABDOMEN AND PELVIS WITH CONTRAST TECHNIQUE: Multidetector CT imaging of the abdomen and pelvis was performed using the standard protocol following bolus administration of intravenous contrast. CONTRAST:  156mL ISOVUE-300 IOPAMIDOL (ISOVUE-300) INJECTION 61% COMPARISON:  CT scan of April 21, 2016. FINDINGS: Lower chest: No acute abnormality. Hepatobiliary: No gallstones are noted.  Stable right hepatic cyst. Pancreas: Unremarkable. No pancreatic ductal dilatation or surrounding inflammatory changes. Spleen: Normal in size without focal abnormality. Adrenals/Urinary Tract: Adrenal glands are unremarkable. Kidneys are normal, without renal calculi, focal lesion, or hydronephrosis. Bladder is unremarkable. Stomach/Bowel: Status post appendectomy. There is no evidence of bowel obstruction. Diverticulosis of the cecum is noted without inflammation. Vascular/Lymphatic: Aortic atherosclerosis. No enlarged abdominal or pelvic lymph nodes. Reproductive: Status post hysterectomy. No adnexal masses. Other: No abdominal wall hernia or abnormality. No abdominopelvic ascites. Musculoskeletal: No acute or significant osseous findings. IMPRESSION: Aortic atherosclerosis. No acute abnormality seen in the abdomen or pelvis. Electronically Signed   By: Marijo Conception, M.D.   On: 06/11/2016 10:57    ____________________________________________   PROCEDURES Procedures  ____________________________________________   INITIAL IMPRESSION / ASSESSMENT AND PLAN / ED COURSE  Pertinent labs & imaging results that were available during my care of the patient were reviewed by me and considered in my  medical decision making (see chart for details).    Clinical Course as of Jun 12 1326  Thu Jun 11, 2016  0914 Pt with DM p/w LLQ pain, vomiting, concerning for diverticulitis. Also has viral symptoms. If CT unremarakble, would treat as viral syndrome.   [PS]  1137 CT neg. Will plan to DC with sx control  [PS]    Clinical Course User Index [PS] Carrie Mew, MD    ----------------------------------------- 1:29 PM on 06/11/2016 -----------------------------------------  Vital signs remain under control. Blood pressure elevated due discomfort; would not start anti-hypertensives until symptoms are under control.  Pt can follow up with  primary care for continued monitoring. Considering the patient's symptoms, medical history, and physical examination today, I have low suspicion for cholecystitis or biliary pathology, pancreatitis, perforation or bowel obstruction, hernia, intra-abdominal abscess, AAA or dissection, volvulus or intussusception, mesenteric ischemia, or appendicitis.low suspicion of STI PID TOA torsion or ectopic.     ____________________________________________   FINAL CLINICAL IMPRESSION(S) / ED DIAGNOSES  Final diagnoses:  Generalized abdominal pain  Viral syndrome      New Prescriptions   DICYCLOMINE (BENTYL) 20 MG TABLET    Take 1 tablet (20 mg total) by mouth 3 (three) times daily as needed for spasms.   FAMOTIDINE (PEPCID) 20 MG TABLET    Take 1 tablet (20 mg total) by mouth 2 (two) times daily.   NAPROXEN (NAPROSYN) 500 MG TABLET    Take 1 tablet (500 mg total) by mouth 2 (two) times daily with a meal.   ONDANSETRON (ZOFRAN ODT) 4 MG DISINTEGRATING TABLET    Take 1 tablet (4 mg total) by mouth every 8 (eight) hours as needed for nausea or vomiting.     Portions of this note were generated with dragon dictation software. Dictation errors may occur despite best attempts at proofreading.    Carrie Mew, MD 06/11/16 [phone]

## 2016-06-11 NOTE — ED Notes (Signed)
Pt assisted to the toilet w/o incident.

## 2016-06-11 NOTE — ED Triage Notes (Signed)
Pt presents to ED with c/o generalized abdominal pain since 7pm last night. Pt denies N/V, last BM was yesterday morning. Pt reports some tenderness, reports similar abdominal pain in the past. Pt reports h/x of diabetes.

## 2016-09-18 ENCOUNTER — Emergency Department
Admission: EM | Admit: 2016-09-18 | Discharge: 2016-09-18 | Disposition: A | Discharge status: Home / Self Care | Payer: Medicaid Other | Attending: Emergency Medicine | Admitting: Emergency Medicine

## 2016-09-18 DIAGNOSIS — K860 Alcohol-induced chronic pancreatitis: Secondary | ICD-10-CM | POA: Insufficient documentation

## 2016-09-18 DIAGNOSIS — I10 Essential (primary) hypertension: Secondary | ICD-10-CM | POA: Diagnosis not present

## 2016-09-18 DIAGNOSIS — Z87891 Personal history of nicotine dependence: Secondary | ICD-10-CM | POA: Diagnosis not present

## 2016-09-18 DIAGNOSIS — J45909 Unspecified asthma, uncomplicated: Secondary | ICD-10-CM | POA: Insufficient documentation

## 2016-09-18 DIAGNOSIS — E119 Type 2 diabetes mellitus without complications: Secondary | ICD-10-CM | POA: Diagnosis not present

## 2016-09-18 DIAGNOSIS — R109 Unspecified abdominal pain: Secondary | ICD-10-CM | POA: Diagnosis present

## 2016-09-18 LAB — URINALYSIS, COMPLETE (UACMP) WITH MICROSCOPIC
BILIRUBIN URINE: NEGATIVE
Bacteria, UA: NONE SEEN
Glucose, UA: >=500 mg/dL — AB
Ketones, ur: 20 mg/dL — AB
Leukocytes, UA: NEGATIVE
Nitrite: NEGATIVE
PROTEIN: 100 mg/dL — AB
Specific Gravity, Urine: 1.014 (ref 1.005–1.030)
pH: 6 (ref 5.0–8.0)

## 2016-09-18 LAB — COMPREHENSIVE METABOLIC PANEL
ALK PHOS: 214 U/L — AB (ref 38–126)
ALT: 38 U/L (ref 14–54)
AST: 34 U/L (ref 15–41)
Albumin: 3.5 g/dL (ref 3.5–5.0)
Anion gap: 16 — ABNORMAL HIGH (ref 5–15)
BUN: 24 mg/dL — ABNORMAL HIGH (ref 6–20)
CALCIUM: 9.1 mg/dL (ref 8.9–10.3)
CHLORIDE: 94 mmol/L — AB (ref 101–111)
CO2: 23 mmol/L (ref 22–32)
Creatinine, Ser: 1.06 mg/dL — ABNORMAL HIGH (ref 0.44–1.00)
GFR calc non Af Amer: 56 mL/min — ABNORMAL LOW (ref >60)
Glucose, Bld: 353 mg/dL — ABNORMAL HIGH (ref 65–99)
POTASSIUM: 4.3 mmol/L (ref 3.5–5.1)
SODIUM: 133 mmol/L — AB (ref 135–145)
Total Bilirubin: 0.9 mg/dL (ref 0.3–1.2)
Total Protein: 7.6 g/dL (ref 6.5–8.1)

## 2016-09-18 LAB — CBC
HCT: 36.5 % (ref 35.0–47.0)
Hemoglobin: 12.9 g/dL (ref 12.0–16.0)
MCH: 31.8 pg (ref 26.0–34.0)
MCHC: 35.3 g/dL (ref 32.0–36.0)
MCV: 90 fL (ref 80.0–100.0)
Platelets: 204 10*3/uL (ref 150–440)
RBC: 4.06 MIL/uL (ref 3.80–5.20)
RDW: 12.2 % (ref 11.5–14.5)
WBC: 8 10*3/uL (ref 3.6–11.0)

## 2016-09-18 LAB — TROPONIN I

## 2016-09-18 LAB — LIPASE, BLOOD: LIPASE: 122 U/L — AB (ref 11–51)

## 2016-09-18 MED ORDER — MORPHINE SULFATE (PF) 4 MG/ML IV SOLN
4.0000 mg | Freq: Once | INTRAVENOUS | Status: AC
  Administered 2016-09-18: 4 mg via INTRAVENOUS
  Filled 2016-09-18: qty 1

## 2016-09-18 MED ORDER — HYDROCODONE-ACETAMINOPHEN 5-325 MG PO TABS: 1.0000 | ORAL_TABLET | Freq: Four times a day (QID) | ORAL | 0 refills | Status: DC | PRN

## 2016-09-18 MED ORDER — ONDANSETRON 4 MG PO TBDP: 4.0000 mg | ORAL_TABLET | Freq: Three times a day (TID) | ORAL | 0 refills | Status: DC | PRN

## 2016-09-18 MED ORDER — ONDANSETRON HCL 4 MG/2ML IJ SOLN
4.0000 mg | Freq: Once | INTRAMUSCULAR | Status: AC
  Administered 2016-09-18: 4 mg via INTRAVENOUS
  Filled 2016-09-18: qty 2

## 2016-09-18 MED ORDER — ONDANSETRON 4 MG PO TBDP
4.0000 mg | ORAL_TABLET | Freq: Once | ORAL | Status: AC
  Administered 2016-09-18: 4 mg via ORAL
  Filled 2016-09-18: qty 1

## 2016-09-18 MED ORDER — HYDROCODONE-ACETAMINOPHEN 5-325 MG PO TABS
2.0000 | ORAL_TABLET | Freq: Once | ORAL | Status: AC
  Administered 2016-09-18: 2 via ORAL
  Filled 2016-09-18: qty 2

## 2016-09-18 MED ORDER — SODIUM CHLORIDE 0.9 % IV SOLN
Freq: Once | INTRAVENOUS | Status: AC
  Administered 2016-09-18: 1000 mL via INTRAVENOUS

## 2016-09-18 NOTE — Discharge Instructions (Addendum)
Please follow-up with your primary care physician this coming Monday for reevaluation. Take your pain and nausea medicine only as needed for severe symptoms and return to the emergency department for any concerns.  It was a pleasure to take care of you today, and thank you for coming to our emergency department.  If you have any questions or concerns before leaving please ask the nurse to grab me and I'm more than happy to go through your aftercare instructions again.  If you were prescribed any opioid pain medication today such as Norco, Vicodin, Percocet, morphine, hydrocodone, or oxycodone please make sure you do not drive when you are taking this medication as it can alter your ability to drive safely.  If you have any concerns once you are home that you are not improving or are in fact getting worse before you can make it to your follow-up appointment, please do not hesitate to call [phone] and come back for further evaluation.  Darel Hong, MD  Results for orders placed or performed during the hospital encounter of 09/18/16  Lipase, blood  Result Value Ref Range   Lipase 122 (H) 11 - 51 U/L  Comprehensive metabolic panel  Result Value Ref Range   Sodium 133 (L) 135 - 145 mmol/L   Potassium 4.3 3.5 - 5.1 mmol/L   Chloride 94 (L) 101 - 111 mmol/L   CO2 23 22 - 32 mmol/L   Glucose, Bld 353 (H) 65 - 99 mg/dL   BUN 24 (H) 6 - 20 mg/dL   Creatinine, Ser 1.06 (H) 0.44 - 1.00 mg/dL   Calcium 9.1 8.9 - 10.3 mg/dL   Total Protein 7.6 6.5 - 8.1 g/dL   Albumin 3.5 3.5 - 5.0 g/dL   AST 34 15 - 41 U/L   ALT 38 14 - 54 U/L   Alkaline Phosphatase 214 (H) 38 - 126 U/L   Total Bilirubin 0.9 0.3 - 1.2 mg/dL   GFR calc non Af Amer 56 (L) >60 mL/min   GFR calc Af Amer >60 >60 mL/min   Anion gap 16 (H) 5 - 15  CBC  Result Value Ref Range   WBC 8.0 3.6 - 11.0 K/uL   RBC 4.06 3.80 - 5.20 MIL/uL   Hemoglobin 12.9 12.0 - 16.0 g/dL   HCT 36.5 35.0 - 47.0 %   MCV 90.0 80.0 - 100.0 fL   MCH 31.8 26.0  - 34.0 pg   MCHC 35.3 32.0 - 36.0 g/dL   RDW 12.2 11.5 - 14.5 %   Platelets 204 150 - 440 K/uL  Urinalysis, Complete w Microscopic  Result Value Ref Range   Color, Urine YELLOW (A) YELLOW   APPearance CLEAR (A) CLEAR   Specific Gravity, Urine 1.014 1.005 - 1.030   pH 6.0 5.0 - 8.0   Glucose, UA >=500 (A) NEGATIVE mg/dL   Hgb urine dipstick SMALL (A) NEGATIVE   Bilirubin Urine NEGATIVE NEGATIVE   Ketones, ur 20 (A) NEGATIVE mg/dL   Protein, ur 100 (A) NEGATIVE mg/dL   Nitrite NEGATIVE NEGATIVE   Leukocytes, UA NEGATIVE NEGATIVE   RBC / HPF 0-5 0 - 5 RBC/hpf   WBC, UA 6-30 0 - 5 WBC/hpf   Bacteria, UA NONE SEEN NONE SEEN   Squamous Epithelial / LPF 0-5 (A) NONE SEEN  Troponin I  Result Value Ref Range   Troponin I <0.03 <0.03 ng/mL

## 2016-09-18 NOTE — ED Provider Notes (Signed)
[facility] Emergency Department Provider Note       Time seen: ----------------------------------------- 1:42 PM on 09/18/2016 -----------------------------------------     I have reviewed the triage vital signs and the nursing notes.   HISTORY   Chief Complaint Abdominal Pain    HPI Karen [name] is a 61 y.o. female who presents to the ED for diffuse abdominal pain with nausea, vomiting and diarrhea. Patient complains of pain since chest today and has a history of pancreatitis. Patient reports drinking 1 beer a couple days ago. Patient has severe abdominal pain, nothing makes it better or worse. She has a history of pancreatitis.   Past Medical History:  Diagnosis Date  . Alcoholic pancreatitis   . Asthma   . Depression   . Diabetes mellitus without complication ([place])   . DKA (diabetic ketoacidoses) ([place]) 02/17/2015  . Emphysematous cystitis   . Hypercholesteremia   . Hypertension   . Hypokalemia     Patient Active Problem List   Diagnosis Date Noted  . Nausea and vomiting   . Esophageal candidiasis ([place])   . Intractable vomiting with nausea   . Hypertensive urgency 04/21/2016  . Abdominal pain   . Acute renal failure ([place])   . Nausea vomiting and diarrhea   . Smoker   . Poorly controlled type 2 diabetes mellitus ([place])   . Acute renal insufficiency 10/29/2015  . Elevated troponin 10/29/2015  . Urinary tract infection without hematuria 10/29/2015  . Left flank pain 10/29/2015  . Malignant essential hypertension 10/29/2015  . DKA (diabetic ketoacidoses) ([place]) 02/17/2015  . Emphysematous cystitis     Past Surgical History:  Procedure Laterality Date  . ABDOMINAL HYSTERECTOMY  1996  . APPENDECTOMY  1997  . ESOPHAGOGASTRODUODENOSCOPY (EGD) WITH PROPOFOL N/A 04/24/2016   Procedure: ESOPHAGOGASTRODUODENOSCOPY (EGD) WITH PROPOFOL;  Surgeon: [name], MD;  Location: [facility] ENDOSCOPY;  Service: Endoscopy;  Laterality: N/A;  . HAND SURGERY   1988  . THYROID SURGERY  2013    Allergies Penicillins; Reglan [metoclopramide]; and Percocet [oxycodone-acetaminophen]  Social History Social History  Substance Use Topics  . Smoking status: Former Research scientist (life sciences)  . Smokeless tobacco: Never Used  . Alcohol use Yes     Comment: daily    Review of Systems Constitutional: Negative for fever. Cardiovascular: Negative for chest pain. Respiratory: Negative for shortness of breath. Gastrointestinal: Positive for abdominal pain, vomiting and diarrhea Genitourinary: Negative for dysuria. Musculoskeletal: Negative for back pain. Skin: Negative for rash. Neurological: Positive for headache and weakness  All systems negative/normal/unremarkable except as stated in the HPI  ____________________________________________   PHYSICAL EXAM:  VITAL SIGNS: ED Triage Vitals [09/18/16 1336]  Enc Vitals Group     BP (!) 183/91     Pulse Rate 95     Resp 14     Temp 98.5 F (36.9 C)     Temp Source Oral     SpO2 99 %     Weight      Height      Head Circumference      Peak Flow      Pain Score 9     Pain Loc      Pain Edu?      Excl. in [place]?    Constitutional: Alert and oriented. Mild distress Eyes: Conjunctivae are normal. Normal extraocular movements. ENT   Head: Normocephalic and atraumatic.   Nose: No congestion/rhinnorhea.   Mouth/Throat: Mucous membranes are moist.   Neck: No stridor. Cardiovascular: Normal rate, regular rhythm.  No murmurs, rubs, or gallops. Respiratory: Normal respiratory effort without tachypnea nor retractions. Breath sounds are clear and equal bilaterally. No wheezes/rales/rhonchi. Gastrointestinal: Diffuse, nonfocal tenderness, no rebound or guarding. Normal bowel sounds. Musculoskeletal: Nontender with normal range of motion in extremities. No lower extremity tenderness nor edema. Neurologic:  Normal speech and language. No gross focal neurologic deficits are appreciated.  Skin:  Skin is warm,  dry and intact. No rash noted. Psychiatric: Mood and affect are normal. Speech and behavior are normal.  ___________________________________________  ED COURSE:  Pertinent labs & imaging results that were available during my care of the patient were reviewed by me and considered in my medical decision making (see chart for details). Patient presents for abdominal pain with vomiting likely from pancreatitis and alcohol, we will assess with labs and imaging as indicated.   Procedures ____________________________________________   LABS (pertinent positives/negatives)  Labs Reviewed  LIPASE, BLOOD  COMPREHENSIVE METABOLIC PANEL  CBC  URINALYSIS, COMPLETE (UACMP) WITH MICROSCOPIC  TROPONIN I  ____________________________________________  FINAL ASSESSMENT AND PLAN  Pancreatitis   Plan: Patient's labs were dictated above. Patient had presented for Abdominal pain which is likely secondary to alcohol-induced pancreatitis or chronic pancreatitis. She is in no distress at this time and overall appears stable for outpatient follow-up.   Earleen Newport, MD   Note: This note was generated in part or whole with voice recognition software. Voice recognition is usually quite accurate but there are transcription errors that can and very often do occur. I apologize for any typographical errors that were not detected and corrected.     Earleen Newport, MD 09/18/16 [phone]

## 2016-09-18 NOTE — ED Triage Notes (Signed)
Pt to ED from home via [facility] c/o abdominal pain. Per EMS pt c/o of abdominal pain since yesterday and has a hx of pancreatitis. Pt also reports NVD, dizziness, headache, and chest pain. Pt alert and oriented x4 in no acute distress at this time.

## 2016-09-18 NOTE — ED Notes (Signed)
Pt declines any food, states, "I will stop and get something on the way home."  Pt given sprite upon request for fluid challenge at this time.

## 2016-09-18 NOTE — ED Provider Notes (Signed)
The patient's pain is improved. She is able to eat and drink. I will give her a trial of hydrocodone and ondansetron as an outpatient and refer her back to her primary care physician as well as her gastroenterologist for her chronic pancreatitis.   Darel Hong, MD 09/18/16 [phone]

## 2016-10-15 ENCOUNTER — Encounter: Payer: Self-pay | Admitting: Emergency Medicine

## 2016-10-15 ENCOUNTER — Inpatient Hospital Stay: Payer: Medicaid Other

## 2016-10-15 ENCOUNTER — Emergency Department: Payer: Medicaid Other

## 2016-10-15 ENCOUNTER — Inpatient Hospital Stay
Admission: EM | Admit: 2016-10-15 | Discharge: 2016-10-16 | DRG: 440 | Disposition: A | Discharge status: Home / Self Care | Payer: Medicaid Other | Attending: Specialist | Admitting: Specialist

## 2016-10-15 DIAGNOSIS — E7800 Pure hypercholesterolemia, unspecified: Secondary | ICD-10-CM | POA: Diagnosis present

## 2016-10-15 DIAGNOSIS — Z23 Encounter for immunization: Secondary | ICD-10-CM | POA: Diagnosis not present

## 2016-10-15 DIAGNOSIS — Z87891 Personal history of nicotine dependence: Secondary | ICD-10-CM

## 2016-10-15 DIAGNOSIS — F1010 Alcohol abuse, uncomplicated: Secondary | ICD-10-CM | POA: Diagnosis present

## 2016-10-15 DIAGNOSIS — Z880 Allergy status to penicillin: Secondary | ICD-10-CM | POA: Diagnosis not present

## 2016-10-15 DIAGNOSIS — R109 Unspecified abdominal pain: Secondary | ICD-10-CM | POA: Diagnosis present

## 2016-10-15 DIAGNOSIS — Z888 Allergy status to other drugs, medicaments and biological substances status: Secondary | ICD-10-CM | POA: Diagnosis not present

## 2016-10-15 DIAGNOSIS — K852 Alcohol induced acute pancreatitis without necrosis or infection: Principal | ICD-10-CM | POA: Diagnosis present

## 2016-10-15 DIAGNOSIS — I10 Essential (primary) hypertension: Secondary | ICD-10-CM | POA: Diagnosis present

## 2016-10-15 DIAGNOSIS — J45909 Unspecified asthma, uncomplicated: Secondary | ICD-10-CM | POA: Diagnosis present

## 2016-10-15 DIAGNOSIS — Z79899 Other long term (current) drug therapy: Secondary | ICD-10-CM

## 2016-10-15 DIAGNOSIS — Z794 Long term (current) use of insulin: Secondary | ICD-10-CM | POA: Diagnosis not present

## 2016-10-15 DIAGNOSIS — K219 Gastro-esophageal reflux disease without esophagitis: Secondary | ICD-10-CM | POA: Diagnosis present

## 2016-10-15 DIAGNOSIS — F329 Major depressive disorder, single episode, unspecified: Secondary | ICD-10-CM | POA: Diagnosis present

## 2016-10-15 DIAGNOSIS — Z885 Allergy status to narcotic agent status: Secondary | ICD-10-CM | POA: Diagnosis not present

## 2016-10-15 DIAGNOSIS — E119 Type 2 diabetes mellitus without complications: Secondary | ICD-10-CM | POA: Diagnosis present

## 2016-10-15 DIAGNOSIS — E1165 Type 2 diabetes mellitus with hyperglycemia: Secondary | ICD-10-CM

## 2016-10-15 LAB — CBC WITH DIFFERENTIAL/PLATELET
Basophils Absolute: 0.1 K/uL (ref 0–0.1)
Basophils Relative: 1 %
Eosinophils Absolute: 0.3 K/uL (ref 0–0.7)
Eosinophils Relative: 4 %
HCT: 37.7 % (ref 35.0–47.0)
Hemoglobin: 13.2 g/dL (ref 12.0–16.0)
Lymphocytes Relative: 37 %
Lymphs Abs: 2.6 K/uL (ref 1.0–3.6)
MCH: 31.3 pg (ref 26.0–34.0)
MCHC: 35 g/dL (ref 32.0–36.0)
MCV: 89.3 fL (ref 80.0–100.0)
Monocytes Absolute: 0.7 K/uL (ref 0.2–0.9)
Monocytes Relative: 10 %
Neutro Abs: 3.5 K/uL (ref 1.4–6.5)
Neutrophils Relative %: 48 %
Platelets: 195 K/uL (ref 150–440)
RBC: 4.23 MIL/uL (ref 3.80–5.20)
RDW: 11.9 % (ref 11.5–14.5)
WBC: 7.1 K/uL (ref 3.6–11.0)

## 2016-10-15 LAB — URINALYSIS, ROUTINE W REFLEX MICROSCOPIC
BILIRUBIN URINE: NEGATIVE
KETONES UR: NEGATIVE mg/dL
LEUKOCYTES UA: NEGATIVE
Nitrite: NEGATIVE
PH: 6 (ref 5.0–8.0)
Protein, ur: 100 mg/dL — AB
Specific Gravity, Urine: 1.003 — ABNORMAL LOW (ref 1.005–1.030)

## 2016-10-15 LAB — COMPREHENSIVE METABOLIC PANEL WITH GFR
ALT: 38 U/L (ref 14–54)
AST: 57 U/L — ABNORMAL HIGH (ref 15–41)
Albumin: 3.6 g/dL (ref 3.5–5.0)
Alkaline Phosphatase: 219 U/L — ABNORMAL HIGH (ref 38–126)
Anion gap: 15 (ref 5–15)
BUN: 20 mg/dL (ref 6–20)
CO2: 22 mmol/L (ref 22–32)
Calcium: 8.9 mg/dL (ref 8.9–10.3)
Chloride: 96 mmol/L — ABNORMAL LOW (ref 101–111)
Creatinine, Ser: 1.18 mg/dL — ABNORMAL HIGH (ref 0.44–1.00)
GFR calc Af Amer: 57 mL/min — ABNORMAL LOW
GFR calc non Af Amer: 49 mL/min — ABNORMAL LOW
Glucose, Bld: 319 mg/dL — ABNORMAL HIGH (ref 65–99)
Potassium: 4.5 mmol/L (ref 3.5–5.1)
Sodium: 133 mmol/L — ABNORMAL LOW (ref 135–145)
Total Bilirubin: 0.5 mg/dL (ref 0.3–1.2)
Total Protein: 7.9 g/dL (ref 6.5–8.1)

## 2016-10-15 LAB — LACTATE DEHYDROGENASE: LDH: 143 U/L (ref 98–192)

## 2016-10-15 LAB — LIPASE, BLOOD: LIPASE: 224 U/L — AB (ref 11–51)

## 2016-10-15 MED ORDER — SODIUM CHLORIDE 0.9 % IV BOLUS (SEPSIS)
1000.0000 mL | Freq: Once | INTRAVENOUS | Status: AC
  Administered 2016-10-15: 1000 mL via INTRAVENOUS

## 2016-10-15 MED ORDER — CYCLOBENZAPRINE HCL 10 MG PO TABS
5.0000 mg | ORAL_TABLET | Freq: Two times a day (BID) | ORAL | Status: DC
  Administered 2016-10-16 (×2): 5 mg via ORAL
  Filled 2016-10-15 (×2): qty 1

## 2016-10-15 MED ORDER — ONDANSETRON HCL 4 MG/2ML IJ SOLN
INTRAMUSCULAR | Status: AC
  Administered 2016-10-15: 4 mg via INTRAVENOUS
  Filled 2016-10-15: qty 2

## 2016-10-15 MED ORDER — DICYCLOMINE HCL 20 MG PO TABS
20.0000 mg | ORAL_TABLET | Freq: Three times a day (TID) | ORAL | Status: DC | PRN
  Filled 2016-10-15: qty 1

## 2016-10-15 MED ORDER — ONDANSETRON HCL 4 MG/2ML IJ SOLN: 4.0000 mg | Freq: Four times a day (QID) | INTRAMUSCULAR | Status: DC | PRN

## 2016-10-15 MED ORDER — HYDROCODONE-ACETAMINOPHEN 5-325 MG PO TABS
1.0000 | ORAL_TABLET | Freq: Four times a day (QID) | ORAL | Status: DC | PRN
  Administered 2016-10-16 (×2): 1 via ORAL
  Filled 2016-10-15 (×2): qty 1

## 2016-10-15 MED ORDER — METOCLOPRAMIDE HCL 5 MG PO TABS
5.0000 mg | ORAL_TABLET | Freq: Three times a day (TID) | ORAL | Status: DC
  Administered 2016-10-16 (×2): 5 mg via ORAL
  Filled 2016-10-15 (×2): qty 1

## 2016-10-15 MED ORDER — MORPHINE SULFATE (PF) 4 MG/ML IV SOLN
4.0000 mg | Freq: Once | INTRAVENOUS | Status: AC
  Administered 2016-10-15: 4 mg via INTRAVENOUS
  Filled 2016-10-15: qty 1

## 2016-10-15 MED ORDER — MORPHINE SULFATE (PF) 2 MG/ML IV SOLN
2.0000 mg | INTRAVENOUS | Status: DC | PRN
  Administered 2016-10-16 (×2): 2 mg via INTRAVENOUS
  Filled 2016-10-15 (×2): qty 1

## 2016-10-15 MED ORDER — ONDANSETRON HCL 4 MG/2ML IJ SOLN
4.0000 mg | Freq: Once | INTRAMUSCULAR | Status: AC
  Administered 2016-10-15 – 2016-10-16 (×2): 4 mg via INTRAVENOUS
  Filled 2016-10-15: qty 2

## 2016-10-15 MED ORDER — AMLODIPINE BESYLATE 5 MG PO TABS
5.0000 mg | ORAL_TABLET | Freq: Every day | ORAL | Status: DC
  Administered 2016-10-16: 10:00:00 5 mg via ORAL
  Filled 2016-10-15: qty 1

## 2016-10-15 MED ORDER — LORAZEPAM 2 MG/ML IJ SOLN: 1.0000 mg | Freq: Four times a day (QID) | INTRAMUSCULAR | Status: DC | PRN

## 2016-10-15 MED ORDER — THIAMINE HCL 100 MG/ML IJ SOLN
100.0000 mg | Freq: Every day | INTRAMUSCULAR | Status: DC
  Filled 2016-10-15: qty 1

## 2016-10-15 MED ORDER — ONDANSETRON HCL 4 MG PO TABS: 4.0000 mg | ORAL_TABLET | Freq: Four times a day (QID) | ORAL | Status: DC | PRN

## 2016-10-15 MED ORDER — FAMOTIDINE 20 MG PO TABS
20.0000 mg | ORAL_TABLET | Freq: Every day | ORAL | Status: DC
  Administered 2016-10-16: 20 mg via ORAL
  Filled 2016-10-15: qty 1

## 2016-10-15 MED ORDER — ONDANSETRON HCL 4 MG/2ML IJ SOLN
4.0000 mg | Freq: Once | INTRAMUSCULAR | Status: AC
  Administered 2016-10-15: 4 mg via INTRAVENOUS

## 2016-10-15 MED ORDER — ONDANSETRON HCL 4 MG/2ML IJ SOLN
INTRAMUSCULAR | Status: AC
  Administered 2016-10-16: 4 mg via INTRAVENOUS
  Filled 2016-10-15: qty 2

## 2016-10-15 MED ORDER — SODIUM CHLORIDE 0.9 % IV SOLN
INTRAVENOUS | Status: DC
  Administered 2016-10-16 (×2): via INTRAVENOUS

## 2016-10-15 MED ORDER — MORPHINE SULFATE (PF) 2 MG/ML IV SOLN
2.0000 mg | Freq: Once | INTRAVENOUS | Status: AC
  Administered 2016-10-15: 2 mg via INTRAVENOUS
  Filled 2016-10-15: qty 1

## 2016-10-15 MED ORDER — TRAZODONE HCL 50 MG PO TABS: 50.0000 mg | ORAL_TABLET | Freq: Every evening | ORAL | Status: DC | PRN

## 2016-10-15 MED ORDER — AMITRIPTYLINE HCL 25 MG PO TABS
25.0000 mg | ORAL_TABLET | Freq: Every day | ORAL | Status: DC
  Administered 2016-10-16: 25 mg via ORAL
  Filled 2016-10-15: qty 1

## 2016-10-15 MED ORDER — METOPROLOL TARTRATE 50 MG PO TABS
50.0000 mg | ORAL_TABLET | Freq: Two times a day (BID) | ORAL | Status: DC
  Administered 2016-10-16 (×2): 50 mg via ORAL
  Filled 2016-10-15 (×2): qty 1

## 2016-10-15 MED ORDER — LISINOPRIL 20 MG PO TABS
40.0000 mg | ORAL_TABLET | Freq: Every day | ORAL | Status: DC
  Administered 2016-10-16: 40 mg via ORAL
  Filled 2016-10-15: qty 2

## 2016-10-15 MED ORDER — ENOXAPARIN SODIUM 40 MG/0.4ML ~~LOC~~ SOLN
40.0000 mg | SUBCUTANEOUS | Status: DC
  Administered 2016-10-16: 40 mg via SUBCUTANEOUS
  Filled 2016-10-15: qty 0.4

## 2016-10-15 MED ORDER — ADULT MULTIVITAMIN W/MINERALS CH
1.0000 | ORAL_TABLET | Freq: Every day | ORAL | Status: DC
  Administered 2016-10-16: 1 via ORAL
  Filled 2016-10-15: qty 1

## 2016-10-15 MED ORDER — VITAMIN B-1 100 MG PO TABS
100.0000 mg | ORAL_TABLET | Freq: Every day | ORAL | Status: DC
  Administered 2016-10-16: 10:00:00 100 mg via ORAL
  Filled 2016-10-15: qty 1

## 2016-10-15 MED ORDER — IOPAMIDOL (ISOVUE-300) INJECTION 61%
75.0000 mL | Freq: Once | INTRAVENOUS | Status: AC | PRN
  Administered 2016-10-15: 75 mL via INTRAVENOUS

## 2016-10-15 MED ORDER — FOLIC ACID 1 MG PO TABS
1.0000 mg | ORAL_TABLET | Freq: Every day | ORAL | Status: DC
  Administered 2016-10-16: 1 mg via ORAL
  Filled 2016-10-15: qty 1

## 2016-10-15 MED ORDER — INSULIN ASPART 100 UNIT/ML ~~LOC~~ SOLN
0.0000 [IU] | Freq: Three times a day (TID) | SUBCUTANEOUS | Status: DC
  Administered 2016-10-16: 08:00:00 5 [IU] via SUBCUTANEOUS
  Administered 2016-10-16: 1 [IU] via SUBCUTANEOUS
  Filled 2016-10-15 (×2): qty 1

## 2016-10-15 MED ORDER — DIPHENHYDRAMINE HCL 25 MG PO TABS
25.0000 mg | ORAL_TABLET | Freq: Four times a day (QID) | ORAL | Status: DC | PRN
  Filled 2016-10-15: qty 1

## 2016-10-15 MED ORDER — LORAZEPAM 1 MG PO TABS: 1.0000 mg | ORAL_TABLET | Freq: Four times a day (QID) | ORAL | Status: DC | PRN

## 2016-10-15 MED ORDER — PANTOPRAZOLE SODIUM 40 MG PO TBEC
40.0000 mg | DELAYED_RELEASE_TABLET | Freq: Every day | ORAL | Status: DC
  Administered 2016-10-16: 10:00:00 40 mg via ORAL
  Filled 2016-10-15: qty 1

## 2016-10-15 MED ORDER — IOPAMIDOL (ISOVUE-300) INJECTION 61%
30.0000 mL | Freq: Once | INTRAVENOUS | Status: AC
  Administered 2016-10-15: 30 mL via ORAL

## 2016-10-15 NOTE — ED Notes (Signed)
Attempted to call report to the floor; was told that they are not yet ready to receive report; they will call back in about 20 minutes for a report.

## 2016-10-15 NOTE — ED Notes (Signed)
Pt alert   Iv fluids infusing   Sin warm and dry .   nsr on monitor.

## 2016-10-15 NOTE — ED Provider Notes (Signed)
[facility] Emergency Department Provider Note  ____________________________________________   First MD Initiated Contact with Patient 10/15/16 1500     (approximate)  I have reviewed the triage vital signs and the nursing notes.   HISTORY  Chief Complaint Upper abdominal pain   HPI Karen [name] is a 61 y.o. female reports about one hour ago she began experiencing pain in her upper abdomen. This occurred just after having about 2 beers. She reports that she's had the same in the past and told she has problems with her pancreas. Denies any vomiting of blood. No vomiting. No diarrhea. Reports her stomach feels painful and slightly swollen across the upper abdomen. Reports pain is 10 out of 10, sharp. No chest pain or trouble breathing  Reports last had similar about a month ago  Reports compliant with her diabetes management  insulin twice daily  Past Medical History:  Diagnosis Date  . Alcoholic pancreatitis   . Asthma   . Depression   . Diabetes mellitus without complication ([place])   . DKA (diabetic ketoacidoses) ([place]) 02/17/2015  . Emphysematous cystitis   . Hypercholesteremia   . Hypertension   . Hypokalemia     Patient Active Problem List   Diagnosis Date Noted  . Nausea and vomiting   . Esophageal candidiasis ([place])   . Intractable vomiting with nausea   . Hypertensive urgency 04/21/2016  . Abdominal pain   . Acute renal failure ([place])   . Nausea vomiting and diarrhea   . Smoker   . Poorly controlled type 2 diabetes mellitus ([place])   . Acute renal insufficiency 10/29/2015  . Elevated troponin 10/29/2015  . Urinary tract infection without hematuria 10/29/2015  . Left flank pain 10/29/2015  . Malignant essential hypertension 10/29/2015  . DKA (diabetic ketoacidoses) ([place]) 02/17/2015  . Emphysematous cystitis     Past Surgical History:  Procedure Laterality Date  . ABDOMINAL HYSTERECTOMY  1996  . APPENDECTOMY  1997  .  ESOPHAGOGASTRODUODENOSCOPY (EGD) WITH PROPOFOL N/A 04/24/2016   Procedure: ESOPHAGOGASTRODUODENOSCOPY (EGD) WITH PROPOFOL;  Surgeon: [name], MD;  Location: [facility] ENDOSCOPY;  Service: Endoscopy;  Laterality: N/A;  . HAND SURGERY  1988  . THYROID SURGERY  2013    Prior to Admission medications   Medication Sig Start Date End Date Taking? Authorizing Provider  amitriptyline (ELAVIL) 25 MG tablet Take 1 tablet (25 mg total) by mouth at bedtime. Patient not taking: Reported on 06/11/2016 11/01/15   Hillary Bow, MD  amLODipine (NORVASC) 5 MG tablet Take 5 mg by mouth daily.    [provider]  butalbital-acetaminophen-caffeine (FIORICET, ESGIC) 50-325-40 MG tablet Take 2 tablets by mouth every 12 (twelve) hours as needed for headache. Patient not taking: Reported on 06/11/2016 11/01/15   Hillary Bow, MD  cyclobenzaprine (FLEXERIL) 5 MG tablet Take 5 mg by mouth 2 (two) times daily.    [provider]  dicyclomine (BENTYL) 20 MG tablet Take 1 tablet (20 mg total) by mouth 3 (three) times daily as needed for spasms. 06/11/16   Carrie Mew, MD  diphenhydrAMINE (BENADRYL) 25 MG tablet Take 25 mg by mouth every 6 (six) hours as needed.    [provider]  famotidine (PEPCID) 20 MG tablet Take 1 tablet (20 mg total) by mouth 2 (two) times daily. Patient not taking: Reported on 09/18/2016 06/11/16   Carrie Mew, MD  HYDROcodone-acetaminophen [facility]) 5-325 MG tablet Take 1 tablet by mouth every 6 (six) hours as needed for severe pain. 09/18/16  Darel Hong, MD  HYDROcodone-acetaminophen (NORCO/VICODIN) 5-325 MG tablet Take 1 tablet by mouth every 6 (six) hours as needed for moderate pain. 09/18/16   Earleen Newport, MD  ibuprofen (ADVIL,MOTRIN) 800 MG tablet Take 800 mg by mouth every 8 (eight) hours as needed for mild pain or moderate pain.    [provider]  insulin aspart protamine - aspart (NOVOLOG MIX 70/30 FLEXPEN) (70-30) 100 UNIT/ML FlexPen  Inject 15 Units into the skin 2 (two) times daily.    [provider]  lisinopril (PRINIVIL,ZESTRIL) 40 MG tablet Take 1 tablet (40 mg total) by mouth daily. Patient not taking: Reported on 09/18/2016 11/01/15   Hillary Bow, MD  metoCLOPramide (REGLAN) 5 MG tablet Take 1 tablet (5 mg total) by mouth 3 (three) times daily before meals. Patient not taking: Reported on 06/11/2016 11/01/15   Hillary Bow, MD  metoprolol (LOPRESSOR) 50 MG tablet Take 1 tablet (50 mg total) by mouth 2 (two) times daily. Patient not taking: Reported on 06/11/2016 12/12/15   Demetrios Loll, MD  naproxen (NAPROSYN) 500 MG tablet Take 1 tablet (500 mg total) by mouth 2 (two) times daily with a meal. Patient not taking: Reported on 09/18/2016 06/11/16   Carrie Mew, MD  ondansetron (ZOFRAN ODT) 4 MG disintegrating tablet Take 1 tablet (4 mg total) by mouth every 8 (eight) hours as needed for nausea or vomiting. 06/11/16   Carrie Mew, MD  ondansetron (ZOFRAN ODT) 4 MG disintegrating tablet Take 1 tablet (4 mg total) by mouth every 8 (eight) hours as needed for nausea or vomiting. 09/18/16   Earleen Newport, MD  ondansetron (ZOFRAN ODT) 4 MG disintegrating tablet Take 1 tablet (4 mg total) by mouth every 8 (eight) hours as needed for nausea or vomiting. 09/18/16   Darel Hong, MD  oxyCODONE-acetaminophen (PERCOCET) 5-325 MG tablet Take 2 tablets by mouth every 6 (six) hours as needed for moderate pain or severe pain. 05/14/15   Earleen Newport, MD  pantoprazole (PROTONIX) 40 MG tablet Take 1 tablet (40 mg total) by mouth daily. Patient not taking: Reported on 06/11/2016 11/01/15   Hillary Bow, MD  traZODone (DESYREL) 50 MG tablet Take 50 mg by mouth at bedtime as needed.     [provider]    Allergies Penicillins; Reglan [metoclopramide]; and Percocet [oxycodone-acetaminophen]  Family History  Problem Relation Age of Onset  . Family history unknown: Yes    Social History Social History   Substance Use Topics  . Smoking status: Former Research scientist (life sciences)  . Smokeless tobacco: Never Used  . Alcohol use Yes     Comment: daily    Review of Systems Constitutional: No fever/chills Eyes: No visual changes. ENT: No sore throat. Cardiovascular: Denies chest pain. Respiratory: Denies shortness of breath. Gastrointestinal: No vomiting.  No diarrhea.  No constipation.No black or bloody stool Genitourinary: Negative for dysuria. Musculoskeletal: Negative for back pain. Skin: Negative for rash. Neurological: Negative for headaches, focal weakness or numbness.    ____________________________________________   PHYSICAL EXAM:  VITAL SIGNS: ED Triage Vitals  Enc Vitals Group     BP      Pulse      Resp      Temp      Temp src      SpO2      Weight      Height      Head Circumference      Peak Flow      Pain Score  Pain Loc      Pain Edu?      Excl. in [place]?     Constitutional: Alert and oriented. Well appearing and in no acute distress.Does appear in pain when she stands and moves to the stretcher, holding her upper abdomen. Eyes: Conjunctivae are normal. Head: Atraumatic. Nose: No congestion/rhinnorhea. Mouth/Throat: Mucous membranes are moist. Neck: No stridor.   Cardiovascular: Normal rate, regular rhythm. Grossly normal heart sounds.  Good peripheral circulation. Respiratory: Normal respiratory effort.  No retractions. Lungs CTAB. Gastrointestinal: Soft and moderately tender in epigastrium. Negative Murphy. No pain in the lower abdomen. No rebound or guarding or peritonitis noted on exam in all quadrants. No distention. Musculoskeletal: No lower extremity tenderness nor edema. Neurologic:  Normal speech and language. No gross focal neurologic deficits are appreciated.  Skin:  Skin is warm, dry and intact. No rash noted. Psychiatric: Mood and affect are normal. Speech and behavior are normal.  ____________________________________________   LABS (all labs ordered  are listed, but only abnormal results are displayed)  Labs Reviewed  COMPREHENSIVE METABOLIC PANEL - Abnormal; Notable for the following:       Result Value   Sodium 133 (*)    Chloride 96 (*)    Glucose, Bld 319 (*)    Creatinine, Ser 1.18 (*)    AST 57 (*)    Alkaline Phosphatase 219 (*)    GFR calc non Af Amer 49 (*)    GFR calc Af Amer 57 (*)    All other components within normal limits  LIPASE, BLOOD - Abnormal; Notable for the following:    Lipase 224 (*)    All other components within normal limits  URINALYSIS, ROUTINE W REFLEX MICROSCOPIC - Abnormal; Notable for the following:    Color, Urine COLORLESS (*)    APPearance CLEAR (*)    Specific Gravity, Urine 1.003 (*)    Glucose, UA >=500 (*)    Hgb urine dipstick SMALL (*)    Protein, ur 100 (*)    Bacteria, UA RARE (*)    Squamous Epithelial / LPF 0-5 (*)    All other components within normal limits  CBC WITH DIFFERENTIAL/PLATELET  LACTATE DEHYDROGENASE  CBG MONITORING, ED   ____________________________________________  EKG  ED ECG REPORT I, Farmer Mccahill, the attending physician, personally viewed and interpreted this ECG.  Date: 10/15/2016 EKG Time: 1505 Rate: 90 Rhythm: normal sinus rhythm QRS Axis: normal Intervals: normal ST/T Wave abnormalities: normal Narrative Interpretation: no evidence of acute ischemia  ____________________________________________  RADIOLOGY    ____________________________________________   PROCEDURES  Procedure(s) performed: None  Procedures  Critical Care performed: No  ____________________________________________   INITIAL IMPRESSION / ASSESSMENT AND PLAN / ED COURSE  Pertinent labs & imaging results that were available during my care of the patient were reviewed by me and considered in my medical decision making (see chart for details).  Differential diagnosis includes but is not limited to, abdominal perforation, aortic dissection, cholecystitis,  appendicitis, diverticulitis, colitis, esophagitis/gastritis, kidney stone, pyelonephritis, urinary tract infection, aortic aneurysm. All are considered in decision and treatment plan. Based upon the patient's presentation and risk factors, proceed to CT scan. I suspect likely pancreatitis based on the clinical history and examination, but given the associated severity of upper abdominal pain we will obtain CT to further evaluate.   Clinical Course as of Oct 15 1925  Thu Oct 15, 2016  1745 GFR, Est Non African American: (!) 36 [MQ]  1745 GFR, Est Non African American: (!) 11 [  MQ]  1756 Patient reports pain coming back now. Ao4x, denies any resp symptoms, and 96% RA at present. RN reports 86% was entered likely in error.  [MQ]    Clinical Course User Index [MQ] Delman Kitten, MD    ----------------------------------------- 7:30 PM on 10/15/2016 -----------------------------------------  Patient reports pain is improving, still appears uncomfortable. Overall symptomatology seems to be improving, but given associated elevated lipase CT scan is pending, but due to storm-related issues CT scan is probably not available for another 1-2 hours. The patient is stable, discussed with her and we will admit her to the hospital for maintaining bowel rest and pain control  Patient agreeable with plan. ____________________________________________   FINAL CLINICAL IMPRESSION(S) / ED DIAGNOSES  Final diagnoses:  Alcohol-induced acute pancreatitis, unspecified complication status      NEW MEDICATIONS STARTED DURING THIS VISIT:  New Prescriptions   No medications on file     Note:  This document was prepared using Dragon voice recognition software and may include unintentional dictation errors.     Delman Kitten, MD 10/15/16 [phone]

## 2016-10-15 NOTE — ED Notes (Signed)
Report off to kenny rn  

## 2016-10-15 NOTE — H&P (Signed)
[facility] NAME: Karen [name]    MR#:  [id number]  DATE OF BIRTH:  1955-12-08  DATE OF ADMISSION:  10/15/2016  PRIMARY CARE PHYSICIAN: Marden Noble, MD   REQUESTING/REFERRING PHYSICIAN: Delman Kitten, MD  CHIEF COMPLAINT:   Chief Complaint  Patient presents with  . Abdominal Pain    HISTORY OF PRESENT ILLNESS: Karen [name]  is a 61 y.o. female with a known history of Alcoholic pancreatitis, asthma, depression, diabetes, essential hypertension who is presenting with complaint of having abdominal pain that started after drinking 2 beers. She reports that she's been feeling like her abdomen as being distended for the past 2 days. She also has had some nausea. In the emergency room she was noted to have elevated lipase. The ED physician ordered a CT scan which is currently pending PAST MEDICAL HISTORY:   Past Medical History:  Diagnosis Date  . Alcoholic pancreatitis   . Asthma   . Depression   . Diabetes mellitus without complication ([place])   . DKA (diabetic ketoacidoses) ([place]) 02/17/2015  . Emphysematous cystitis   . Hypercholesteremia   . Hypertension   . Hypokalemia     PAST SURGICAL HISTORY: Past Surgical History:  Procedure Laterality Date  . ABDOMINAL HYSTERECTOMY  1996  . APPENDECTOMY  1997  . ESOPHAGOGASTRODUODENOSCOPY (EGD) WITH PROPOFOL N/A 04/24/2016   Procedure: ESOPHAGOGASTRODUODENOSCOPY (EGD) WITH PROPOFOL;  Surgeon: [name], MD;  Location: [facility] ENDOSCOPY;  Service: Endoscopy;  Laterality: N/A;  . HAND SURGERY  1988  . THYROID SURGERY  2013    SOCIAL HISTORY:  Social History  Substance Use Topics  . Smoking status: Former Research scientist (life sciences)  . Smokeless tobacco: Never Used  . Alcohol use Yes     Comment: daily    FAMILY HISTORY:  Family History  Problem Relation Age of Onset  . Family history unknown: Yes    DRUG ALLERGIES:  Allergies  Allergen Reactions  . Penicillins Anaphylaxis    Has patient had a PCN  reaction causing immediate rash, facial/tongue/throat swelling, SOB or lightheadedness with hypotension: Yes Has patient had a PCN reaction causing severe rash involving mucus membranes or skin necrosis: No Has patient had a PCN reaction that required hospitalization No Has patient had a PCN reaction occurring within the last 10 years: No If all of the above answers are "NO", then may proceed with Cephalosporin use.  . Reglan [Metoclopramide] Other (See Comments)    Hypotension, shortness of breath  . Percocet [Oxycodone-Acetaminophen] Rash    REVIEW OF SYSTEMS:   CONSTITUTIONAL: No fever, fatigue or weakness.  EYES: No blurred or double vision.  EARS, NOSE, AND THROAT: No tinnitus or ear pain.  RESPIRATORY: No cough, shortness of breath, wheezing or hemoptysis.  CARDIOVASCULAR: No chest pain, orthopnea, edema.  GASTROINTESTINAL: Positive nausea, vomiting, diarrhea or positive abdominal pain.  GENITOURINARY: No dysuria, hematuria.  ENDOCRINE: No polyuria, nocturia,  HEMATOLOGY: No anemia, easy bruising or bleeding SKIN: No rash or lesion. MUSCULOSKELETAL: No joint pain or arthritis.   NEUROLOGIC: No tingling, numbness, weakness.  PSYCHIATRY: No anxiety or depression.   MEDICATIONS AT HOME:  Prior to Admission medications   Medication Sig Start Date End Date Taking? Authorizing Provider  amLODipine (NORVASC) 5 MG tablet Take 5 mg by mouth daily.   Yes [provider]  atorvastatin (LIPITOR) 10 MG tablet Take 10 mg by mouth daily.   Yes [provider]  diphenhydrAMINE (BENADRYL) 25 MG tablet Take 25 mg by  mouth every 6 (six) hours as needed.   Yes [provider]  flurazepam (DALMANE) 15 MG capsule Take 15 mg by mouth at bedtime as needed for sleep.   Yes [provider]  insulin aspart protamine - aspart (NOVOLOG MIX 70/30 FLEXPEN) (70-30) 100 UNIT/ML FlexPen Inject 15 Units into the skin 2 (two) times daily.   Yes [provider]   amitriptyline (ELAVIL) 25 MG tablet Take 1 tablet (25 mg total) by mouth at bedtime. Patient not taking: Reported on 10/15/2016 11/01/15   Hillary Bow, MD  butalbital-acetaminophen-caffeine (FIORICET, ESGIC) [phone] MG tablet Take 2 tablets by mouth every 12 (twelve) hours as needed for headache. Patient not taking: Reported on 06/11/2016 11/01/15   Hillary Bow, MD  cyclobenzaprine (FLEXERIL) 5 MG tablet Take 5 mg by mouth 2 (two) times daily.    [provider]  dicyclomine (BENTYL) 20 MG tablet Take 1 tablet (20 mg total) by mouth 3 (three) times daily as needed for spasms. 06/11/16   Carrie Mew, MD  famotidine (PEPCID) 20 MG tablet Take 1 tablet (20 mg total) by mouth 2 (two) times daily. Patient not taking: Reported on 09/18/2016 06/11/16   Carrie Mew, MD  HYDROcodone-acetaminophen [facility]) 5-325 MG tablet Take 1 tablet by mouth every 6 (six) hours as needed for severe pain. 09/18/16   Darel Hong, MD  HYDROcodone-acetaminophen (NORCO/VICODIN) 5-325 MG tablet Take 1 tablet by mouth every 6 (six) hours as needed for moderate pain. 09/18/16   Earleen Newport, MD  ibuprofen (ADVIL,MOTRIN) 800 MG tablet Take 800 mg by mouth every 8 (eight) hours as needed for mild pain or moderate pain.    [provider]  lisinopril (PRINIVIL,ZESTRIL) 40 MG tablet Take 1 tablet (40 mg total) by mouth daily. Patient not taking: Reported on 10/15/2016 11/01/15   Hillary Bow, MD  metoCLOPramide (REGLAN) 5 MG tablet Take 1 tablet (5 mg total) by mouth 3 (three) times daily before meals. Patient not taking: Reported on 06/11/2016 11/01/15   Hillary Bow, MD  metoprolol (LOPRESSOR) 50 MG tablet Take 1 tablet (50 mg total) by mouth 2 (two) times daily. Patient not taking: Reported on 06/11/2016 12/12/15   Demetrios Loll, MD  naproxen (NAPROSYN) 500 MG tablet Take 1 tablet (500 mg total) by mouth 2 (two) times daily with a meal. Patient not taking: Reported on 09/18/2016 06/11/16    Carrie Mew, MD  ondansetron (ZOFRAN ODT) 4 MG disintegrating tablet Take 1 tablet (4 mg total) by mouth every 8 (eight) hours as needed for nausea or vomiting. 06/11/16   Carrie Mew, MD  ondansetron (ZOFRAN ODT) 4 MG disintegrating tablet Take 1 tablet (4 mg total) by mouth every 8 (eight) hours as needed for nausea or vomiting. Patient not taking: Reported on 10/15/2016 09/18/16   Earleen Newport, MD  ondansetron (ZOFRAN ODT) 4 MG disintegrating tablet Take 1 tablet (4 mg total) by mouth every 8 (eight) hours as needed for nausea or vomiting. 09/18/16   Darel Hong, MD  oxyCODONE-acetaminophen (PERCOCET) 5-325 MG tablet Take 2 tablets by mouth every 6 (six) hours as needed for moderate pain or severe pain. 05/14/15   Earleen Newport, MD  pantoprazole (PROTONIX) 40 MG tablet Take 1 tablet (40 mg total) by mouth daily. Patient not taking: Reported on 06/11/2016 11/01/15   Hillary Bow, MD  traZODone (DESYREL) 50 MG tablet Take 50 mg by mouth at bedtime as needed.     [provider]      PHYSICAL EXAMINATION:  VITAL SIGNS: Blood pressure (!) 162/84, pulse 93, temperature 98.5 F (36.9 C), temperature source Oral, resp. rate 20, height 5\' 1"  (1.549 m), weight 110 lb (49.9 kg), SpO2 (!) 86 %.  GENERAL:  61 y.o.-year-old patient lying in the bed with no acute distress.  EYES: Pupils equal, round, reactive to light and accommodation. No scleral icterus. Extraocular muscles intact.  HEENT: Head atraumatic, normocephalic. Oropharynx and nasopharynx clear.  NECK:  Supple, no jugular venous distention. No thyroid enlargement, no tenderness.  LUNGS: Normal breath sounds bilaterally, no wheezing, rales,rhonchi or crepitation. No use of accessory muscles of respiration.  CARDIOVASCULAR: S1, S2 normal. No murmurs, rubs, or gallops.  ABDOMEN: Soft, Epigastric tenderness Mildly distended. Bowel sounds present. No organomegaly or mass.  EXTREMITIES: No pedal edema, cyanosis,  or clubbing.  NEUROLOGIC: Cranial nerves II through XII are intact. Muscle strength 5/5 in all extremities. Sensation intact. Gait not checked.  PSYCHIATRIC: The patient is alert and oriented x 3.  SKIN: No obvious rash, lesion, or ulcer.   LABORATORY PANEL:   CBC  Recent Labs Lab 10/15/16 1500  WBC 7.1  HGB 13.2  HCT 37.7  PLT 195  MCV 89.3  MCH 31.3  MCHC 35.0  RDW 11.9  LYMPHSABS 2.6  MONOABS 0.7  EOSABS 0.3  BASOSABS 0.1   ------------------------------------------------------------------------------------------------------------------  Chemistries   Recent Labs Lab 10/15/16 1500  NA 133*  K 4.5  CL 96*  CO2 22  GLUCOSE 319*  BUN 20  CREATININE 1.18*  CALCIUM 8.9  AST 57*  ALT 38  ALKPHOS 219*  BILITOT 0.5   ------------------------------------------------------------------------------------------------------------------ estimated creatinine clearance is 38.7 mL/min (A) (by C-G formula based on SCr of 1.18 mg/dL (H)). ------------------------------------------------------------------------------------------------------------------ No results for input(s): TSH, T4TOTAL, T3FREE, THYROIDAB in the last 72 hours.  Invalid input(s): FREET3   Coagulation profile No results for input(s): INR, PROTIME in the last 168 hours. ------------------------------------------------------------------------------------------------------------------- No results for input(s): DDIMER in the last 72 hours. -------------------------------------------------------------------------------------------------------------------  Cardiac Enzymes No results for input(s): CKMB, TROPONINI, MYOGLOBIN in the last 168 hours.  Invalid input(s): CK ------------------------------------------------------------------------------------------------------------------ Invalid input(s):  POCBNP  ---------------------------------------------------------------------------------------------------------------  Urinalysis    Component Value Date/Time   COLORURINE COLORLESS (A) 10/15/2016 1559   APPEARANCEUR CLEAR (A) 10/15/2016 1559   APPEARANCEUR Hazy 08/11/2013 0729   LABSPEC 1.003 (L) 10/15/2016 1559   LABSPEC 1.030 08/11/2013 0729   PHURINE 6.0 10/15/2016 1559   GLUCOSEU >=500 (A) 10/15/2016 1559   GLUCOSEU >=500 08/11/2013 0729   HGBUR SMALL (A) 10/15/2016 1559   BILIRUBINUR NEGATIVE 10/15/2016 1559   BILIRUBINUR Negative 08/11/2013 0729   KETONESUR NEGATIVE 10/15/2016 1559   PROTEINUR 100 (A) 10/15/2016 1559   NITRITE NEGATIVE 10/15/2016 1559   LEUKOCYTESUR NEGATIVE 10/15/2016 1559   LEUKOCYTESUR Negative 08/11/2013 0729     RADIOLOGY: No results found.  EKG: Orders placed or performed during the hospital encounter of 10/15/16  . ED EKG  . ED EKG  . EKG 12-Lead  . EKG 12-Lead  . EKG 12-Lead  . EKG 12-Lead    IMPRESSION AND PLAN: Patient is a 62 year old with history of alcohol abuse presenting with abdominal pain  1. Abdominal pain Suspect due to acute pancreatitis I will keep her nothing by mouth, give her IV fluids, pain control Await CT scan of the abdomen results If she doesn't improve will have GI evaluation  2. Diabetes type 2 Hold her insulin Place her on sliding scale  3. Essential hypertension continue Norvasc, and lisinopril  4. Alcohol abuse patient states that  she has been only drinking 2 beers 2 times a week however she is not a reliable historian I will place her on a Sever protocol  5. Miscellaneous Lovenox for DVT prophylaxis   All the records are reviewed and case discussed with ED provider. Management plans discussed with the patient, family and they are in agreement.  CODE STATUS: Code Status History    Date Active Date Inactive Code Status Order ID Comments User Context   04/21/2016  5:05 AM 04/24/2016  8:31 PM Full  Code [id number]  Saundra Shelling, MD Inpatient   12/31/2015  7:39 AM 01/01/2016  8:08 PM Full Code [id number]  Saundra Shelling, MD Inpatient   12/10/2015 11:09 AM 12/12/2015  6:48 PM Full Code [id number]  Demetrios Loll, MD Inpatient   10/29/2015  3:45 PM 11/01/2015  4:23 PM Full Code [id number]  Theodoro Grist, MD Inpatient   02/17/2015  5:04 PM 02/19/2015  6:04 PM Full Code [id number]  Nicholes Mango, MD Inpatient       TOTAL TIME TAKING CARE OF THIS PATIENT:55minutes.    Dustin Flock M.D on 10/15/2016 at 9:04 PM  Between 7am to 6pm - Pager - [phone]  After 6pm go to [email or web address] - password [email or web address] [facility]  Office  [phone]  CC: Primary care physician; Marden Noble, MD

## 2016-10-15 NOTE — ED Triage Notes (Signed)
Pt presents to ED via [facility] with c/o sudden onset abdominal distention and pain after drinking 2 beers approx 2 hours ago for her neighbors birthday. EMS reports hx of DM, pancreatitis, and GI bleed. Pt currently c/o pain and distention, pt appears with obvious abdominal distention, pt c/o pain with palpation. Pt also c/o N/V at this time. Pt is alert and oriented.

## 2016-10-15 NOTE — ED Notes (Signed)
Assisted patient to restroom.

## 2016-10-16 ENCOUNTER — Encounter: Payer: Self-pay | Admitting: *Deleted

## 2016-10-16 LAB — BASIC METABOLIC PANEL
Anion gap: 11 (ref 5–15)
BUN: 21 mg/dL — AB (ref 6–20)
CALCIUM: 8 mg/dL — AB (ref 8.9–10.3)
CO2: 22 mmol/L (ref 22–32)
CREATININE: 1 mg/dL (ref 0.44–1.00)
Chloride: 105 mmol/L (ref 101–111)
GFR calc Af Amer: >60 mL/min (ref >60)
GLUCOSE: 299 mg/dL — AB (ref 65–99)
Potassium: 4.2 mmol/L (ref 3.5–5.1)
Sodium: 138 mmol/L (ref 135–145)

## 2016-10-16 LAB — GLUCOSE, CAPILLARY
GLUCOSE-CAPILLARY: 76 mg/dL (ref 65–99)
Glucose-Capillary: 126 mg/dL — ABNORMAL HIGH (ref 65–99)
Glucose-Capillary: 264 mg/dL — ABNORMAL HIGH (ref 65–99)
Glucose-Capillary: 359 mg/dL — ABNORMAL HIGH (ref 65–99)

## 2016-10-16 LAB — CBC
HCT: 35.4 % (ref 35.0–47.0)
Hemoglobin: 12.1 g/dL (ref 12.0–16.0)
MCH: 31.3 pg (ref 26.0–34.0)
MCHC: 34.1 g/dL (ref 32.0–36.0)
MCV: 91.9 fL (ref 80.0–100.0)
PLATELETS: 176 10*3/uL (ref 150–440)
RBC: 3.85 MIL/uL (ref 3.80–5.20)
RDW: 12 % (ref 11.5–14.5)
WBC: 7.3 10*3/uL (ref 3.6–11.0)

## 2016-10-16 LAB — LIPASE, BLOOD: Lipase: 78 U/L — ABNORMAL HIGH (ref 11–51)

## 2016-10-16 MED ORDER — HYDROCODONE-ACETAMINOPHEN 5-325 MG PO TABS: 1.0000 | ORAL_TABLET | Freq: Four times a day (QID) | ORAL | 0 refills | Status: DC | PRN

## 2016-10-16 MED ORDER — INSULIN GLARGINE 100 UNIT/ML ~~LOC~~ SOLN
10.0000 [IU] | Freq: Every day | SUBCUTANEOUS | Status: DC
  Filled 2016-10-16: qty 0.1

## 2016-10-16 MED ORDER — ONDANSETRON 4 MG PO TBDP: 4.0000 mg | ORAL_TABLET | Freq: Three times a day (TID) | ORAL | 0 refills | Status: DC | PRN

## 2016-10-16 MED ORDER — CYCLOBENZAPRINE HCL 10 MG PO TABS: 5.0000 mg | ORAL_TABLET | Freq: Two times a day (BID) | ORAL | Status: DC | PRN

## 2016-10-16 MED ORDER — INSULIN ASPART 100 UNIT/ML ~~LOC~~ SOLN: 0.0000 [IU] | Freq: Every day | SUBCUTANEOUS | Status: DC

## 2016-10-16 MED ORDER — INFLUENZA VAC SPLIT QUAD 0.5 ML IM SUSY: 0.5000 mL | PREFILLED_SYRINGE | INTRAMUSCULAR | Status: DC

## 2016-10-16 MED ORDER — INFLUENZA VAC SPLIT QUAD 0.5 ML IM SUSY
0.5000 mL | PREFILLED_SYRINGE | Freq: Once | INTRAMUSCULAR | Status: AC
  Administered 2016-10-16: 0.5 mL via INTRAMUSCULAR
  Filled 2016-10-16: qty 0.5

## 2016-10-16 MED ORDER — INSULIN ASPART 100 UNIT/ML ~~LOC~~ SOLN: 0.0000 [IU] | Freq: Three times a day (TID) | SUBCUTANEOUS | Status: DC

## 2016-10-16 NOTE — Progress Notes (Signed)
Pt prepared for dc. Flu shot given.  Given dc instructions, read dc instructions to pt, and pt  verbalized understanding of instructions.  Pt requested pain medication but when told that she would have to wait for rx to be signed, she decided to leave.  Pt escorted to car in wheelchair in the company of her SO in stable condition.

## 2016-10-16 NOTE — Progress Notes (Signed)
[facility] at [place] NAME: Karen [name]    MR#:  [id number]  DATE OF BIRTH:  1955/06/27  SUBJECTIVE:   She is here due to abdominal pain, nausea vomiting and suspected to have acute pancreatitis. Lipase level has trended down. Patient is still complaining of pain but no nausea or vomiting.  REVIEW OF SYSTEMS:    Review of Systems  Constitutional: Negative for chills and fever.  HENT: Negative for congestion and tinnitus.   Eyes: Negative for blurred vision and double vision.  Respiratory: Negative for cough, shortness of breath and wheezing.   Cardiovascular: Negative for chest pain, orthopnea and PND.  Gastrointestinal: Positive for abdominal pain (epigastric). Negative for diarrhea, nausea and vomiting.  Genitourinary: Negative for dysuria and hematuria.  Neurological: Negative for dizziness, sensory change and focal weakness.  All other systems reviewed and are negative.   Nutrition: Clear Liquid Tolerating Diet: Yes Tolerating PT: Ambulatory  DRUG ALLERGIES:   Allergies  Allergen Reactions  . Penicillins Anaphylaxis    Has patient had a PCN reaction causing immediate rash, facial/tongue/throat swelling, SOB or lightheadedness with hypotension: Yes Has patient had a PCN reaction causing severe rash involving mucus membranes or skin necrosis: No Has patient had a PCN reaction that required hospitalization No Has patient had a PCN reaction occurring within the last 10 years: No If all of the above answers are "NO", then may proceed with Cephalosporin use.  . Reglan [Metoclopramide] Other (See Comments)    Hypotension, shortness of breath  . Percocet [Oxycodone-Acetaminophen] Rash    VITALS:  Blood pressure 130/68, pulse 66, temperature 98.2 F (36.8 C), temperature source Oral, resp. rate 20, height 5\' 1"  (1.549 m), weight 49.9 kg (110 lb), SpO2 99 %.  PHYSICAL EXAMINATION:   Physical Exam  GENERAL:  61 y.o.-year-old patient  lying in bed in no acute distress.  EYES: Pupils equal, round, reactive to light and accommodation. No scleral icterus. Extraocular muscles intact.  HEENT: Head atraumatic, normocephalic. Oropharynx and nasopharynx clear.  NECK:  Supple, no jugular venous distention. No thyroid enlargement, no tenderness.  LUNGS: Normal breath sounds bilaterally, no wheezing, rales, rhonchi. No use of accessory muscles of respiration.  CARDIOVASCULAR: S1, S2 normal. No murmurs, rubs, or gallops.  ABDOMEN: Soft, nontender, nondistended. Bowel sounds present. No organomegaly or mass.  EXTREMITIES: No cyanosis, clubbing or edema b/l.    NEUROLOGIC: Cranial nerves II through XII are intact. No focal Motor or sensory deficits b/l.   PSYCHIATRIC: The patient is alert and oriented x 3.  SKIN: No obvious rash, lesion, or ulcer.    LABORATORY PANEL:   CBC  Recent Labs Lab 10/16/16 0428  WBC 7.3  HGB 12.1  HCT 35.4  PLT 176   ------------------------------------------------------------------------------------------------------------------  Chemistries   Recent Labs Lab 10/15/16 1500 10/16/16 0428  NA 133* 138  K 4.5 4.2  CL 96* 105  CO2 22 22  GLUCOSE 319* 299*  BUN 20 21*  CREATININE 1.18* 1.00  CALCIUM 8.9 8.0*  AST 57*  --   ALT 38  --   ALKPHOS 219*  --   BILITOT 0.5  --    ------------------------------------------------------------------------------------------------------------------  Cardiac Enzymes No results for input(s): TROPONINI in the last 168 hours. ------------------------------------------------------------------------------------------------------------------  RADIOLOGY:  Ct Abdomen Pelvis W Contrast  Result Date: 10/15/2016 CLINICAL DATA:  Acute onset of upper abdominal pain. Initial encounter. EXAM: CT ABDOMEN AND PELVIS WITH CONTRAST TECHNIQUE: Multidetector CT imaging of the abdomen and pelvis was performed  using the standard protocol following bolus administration  of intravenous contrast. CONTRAST:  4mL ISOVUE-300 IOPAMIDOL (ISOVUE-300) INJECTION 61% COMPARISON:  CT of the abdomen and pelvis from 06/11/2016 FINDINGS: Lower chest: Minimal bibasilar atelectasis is noted. The visualized portions of the mediastinum are unremarkable. Tiny nonspecific nodes are seen adjacent to the distal esophagus. Hepatobiliary: The liver is unremarkable in appearance. The gallbladder is unremarkable in appearance. The common bile duct remains normal in caliber. Pancreas: The pancreas is within normal limits. Spleen: The spleen is unremarkable in appearance. Adrenals/Urinary Tract: The adrenal glands are unremarkable in appearance. Nonspecific perinephric stranding is noted bilaterally. There is no evidence of hydronephrosis. A nonobstructing 2 mm stone is noted at the interpole region of the right kidney. No ureteral stones are identified. Stomach/Bowel: The stomach is unremarkable in appearance. The small bowel is within normal limits. The appendix is normal in caliber, without evidence of appendicitis. Scattered diverticulosis is noted along the ascending colon, without evidence of diverticulitis. Vascular/Lymphatic: Scattered calcification is seen along the abdominal aorta and its branches. The abdominal aorta is otherwise grossly unremarkable. The inferior vena cava is grossly unremarkable. No retroperitoneal lymphadenopathy is seen. No pelvic sidewall lymphadenopathy is identified. Reproductive: The bladder is significantly distended and grossly unremarkable. The patient is status post hysterectomy. The ovaries are grossly symmetric. No suspicious adnexal masses are seen. Other: No additional soft tissue abnormalities are seen. Musculoskeletal: No acute osseous abnormalities are identified. The visualized musculature is unremarkable in appearance. IMPRESSION: 1. No acute abnormality seen to explain the patient's symptoms. 2. Slightly increased nonspecific perinephric stranding. Would  correlate for evidence of pyelonephritis. 3. Nonobstructing 2 mm stone at the interpole region of the right kidney. 4. Scattered diverticulosis along the ascending colon, without evidence of diverticulitis. 5. Scattered aortic atherosclerosis. Electronically Signed   By: Garald Balding M.D.   On: 10/15/2016 21:43     ASSESSMENT AND PLAN:   61 year old female with past medical history of hypertension, hyperlipidemia, previous history of DKA,. History of alcoholic pancreatitis, who presented to the hospital due to abdominal pain.  1. Acute pancreatitis-this is the cause of patient's abdominal pain patient had elevated lipase with recent alcohol abuse. CT scan of the abdomen and pelvis was although was negative for any acute evidence of pancreatic inflammation. -Continue supportive care with IV fluids, pain control, antiemetics. We'll start on clear liquid diet today and monitor. -Lipase has trended down. Next  2. Alcohol abuse-no evidence of alcohol withdrawal. Continue CIWA protocol.  3. Diabetes type 2 without complication-patient's blood sugars are somewhat elevated. We'll start on low-dose Lantus, continue sliding scale insulin. Appreciate diabetes coronary input.  4. Essential hypertension-continue lisinopril, metoprolol.  5. GERD-continue Protonix, Pepcid.      All the records are reviewed and case discussed with Care Management/Social Worker. Management plans discussed with the patient, family and they are in agreement.  CODE STATUS: Full code  DVT Prophylaxis: Lovenox  TOTAL TIME TAKING CARE OF THIS PATIENT: 30 minutes.   POSSIBLE D/C IN 1-2 DAYS, DEPENDING ON CLINICAL CONDITION.   Henreitta Leber M.D on 10/16/2016 at 2:18 PM  Between 7am to 6pm - Pager - [phone]  After 6pm go to [email or web address] - Proofreader  [facility]  Office  [phone]  CC: Primary care physician; Marden Noble, MD

## 2016-10-16 NOTE — Progress Notes (Signed)
Inpatient Diabetes Program Recommendations  AACE/ADA: New Consensus Statement on Inpatient Glycemic Control (2015)  Target Ranges:  Prepandial:   less than 140 mg/dL      Peak postprandial:   less than 180 mg/dL (1-2 hours)      Critically ill patients:  140 - 180 mg/dL   Lab Results  Component Value Date   GLUCAP 264 (H) 10/16/2016   HGBA1C 9.7 (H) 10/29/2015    Review of Glycemic Control Results for Karen [name], Karen [name] (MRN [id number]) as of 10/16/2016 10:26  Ref. Range 10/16/2016 00:33 10/16/2016 06:06  Glucose-Capillary Latest Ref Range: 65 - 99 mg/dL 359 (H) 264 (H)     Diabetes history: Type 2 Outpatient Diabetes medications: Novolog mix 70/30 15 units bid Current orders for Inpatient glycemic control: Novolog 0-9 units tid  Inpatient Diabetes Program Recommendations:    Since the patient is NPO, please consider changing Novolog 0-9 units to q4h.  Consider adding Lantus 10 units qhs starting today (this is 1/2 her home dose of basal insulin)  Per ADA recommendations "consider performing an A1C on all patients with diabetes or hyperglycemia admitted to the hospital if not performed in the prior 3 months".  Text paged Dr. Verdell Carmine at 10:31am    Gentry Fitz, RN, BA, [place], CDE Diabetes Coordinator Inpatient Diabetes Program  [phone] (Team Pager) [phone] ([place]) 10/16/2016 10:29 AM

## 2016-10-17 LAB — HIV ANTIBODY (ROUTINE TESTING W REFLEX): HIV SCREEN 4TH GENERATION: NONREACTIVE

## 2016-10-24 NOTE — Discharge Summary (Signed)
[facility] at [place] NAME: Kenniya Westrich    MR#:  [id number]  DATE OF BIRTH:  05-05-1955  DATE OF ADMISSION:  10/15/2016 ADMITTING PHYSICIAN: Dustin Flock, MD  DATE OF DISCHARGE: 10/16/2016  7:30 PM  PRIMARY CARE PHYSICIAN: Marden Noble, MD    ADMISSION DIAGNOSIS:  Alcohol-induced acute pancreatitis, unspecified complication status [G31.51]  DISCHARGE DIAGNOSIS:  Active Problems:   Abdominal pain   SECONDARY DIAGNOSIS:   Past Medical History:  Diagnosis Date  . Alcoholic pancreatitis   . Asthma   . Depression   . Diabetes mellitus without complication ([place])   . DKA (diabetic ketoacidoses) ([place]) 02/17/2015  . Emphysematous cystitis   . Hypercholesteremia   . Hypertension   . Hypokalemia     HOSPITAL COURSE:   61 year old female with past medical history of hypertension, hyperlipidemia, previous history of DKA,. History of alcoholic pancreatitis, who presented to the hospital due to abdominal pain.  1. Acute pancreatitis-this was the cause of patient's abdominal pain patient had elevated lipase with recent alcohol abuse. CT scan of the abdomen and pelvis was although was negative for any acute evidence of pancreatic inflammation. - pt. Was treated supportively with IV fluids,  pain control, antiemetics. Her Lipase trended down and pain improved and diet was advanced slowly which she is tolerating without any N/V or ab. Pain.  - she is therefore being discharged home.   2. Alcohol abuse-no evidence of alcohol withdrawal.  - she was on CIWA but did not have have any symptoms while she was in the hospital.   3. Diabetes type 2 without complication- pt. Will resume her Novolin 70/30 upon discharge.   4. Essential hypertension- pt. Will cont. Her Norvasc.    DISCHARGE CONDITIONS:   Stable  CONSULTS OBTAINED:    DRUG ALLERGIES:   Allergies  Allergen Reactions  . Penicillins Anaphylaxis    Has patient had a PCN  reaction causing immediate rash, facial/tongue/throat swelling, SOB or lightheadedness with hypotension: Yes Has patient had a PCN reaction causing severe rash involving mucus membranes or skin necrosis: No Has patient had a PCN reaction that required hospitalization No Has patient had a PCN reaction occurring within the last 10 years: No If all of the above answers are "NO", then may proceed with Cephalosporin use.  . Reglan [Metoclopramide] Other (See Comments)    Hypotension, shortness of breath  . Percocet [Oxycodone-Acetaminophen] Rash    DISCHARGE MEDICATIONS:   Allergies as of 10/16/2016      Reactions   Penicillins Anaphylaxis   Has patient had a PCN reaction causing immediate rash, facial/tongue/throat swelling, SOB or lightheadedness with hypotension: Yes Has patient had a PCN reaction causing severe rash involving mucus membranes or skin necrosis: No Has patient had a PCN reaction that required hospitalization No Has patient had a PCN reaction occurring within the last 10 years: No If all of the above answers are "NO", then may proceed with Cephalosporin use.   Reglan [metoclopramide] Other (See Comments)   Hypotension, shortness of breath   Percocet [oxycodone-acetaminophen] Rash      Medication List    STOP taking these medications   amitriptyline 25 MG tablet Commonly known as:  ELAVIL   butalbital-acetaminophen-caffeine 50-325-40 MG tablet Commonly known as:  FIORICET, ESGIC   famotidine 20 MG tablet Commonly known as:  PEPCID   lisinopril 40 MG tablet Commonly known as:  PRINIVIL,ZESTRIL   metoCLOPramide 5 MG tablet Commonly known as:  REGLAN  metoprolol tartrate 50 MG tablet Commonly known as:  LOPRESSOR   naproxen 500 MG tablet Commonly known as:  NAPROSYN   oxyCODONE-acetaminophen 5-325 MG tablet Commonly known as:  PERCOCET   pantoprazole 40 MG tablet Commonly known as:  PROTONIX     TAKE these medications   amLODipine 5 MG  tablet Commonly known as:  NORVASC Take 5 mg by mouth daily.   atorvastatin 10 MG tablet Commonly known as:  LIPITOR Take 10 mg by mouth daily.   cyclobenzaprine 5 MG tablet Commonly known as:  FLEXERIL Take 5 mg by mouth 2 (two) times daily.   dicyclomine 20 MG tablet Commonly known as:  BENTYL Take 1 tablet (20 mg total) by mouth 3 (three) times daily as needed for spasms.   diphenhydrAMINE 25 MG tablet Commonly known as:  BENADRYL Take 25 mg by mouth every 6 (six) hours as needed.   flurazepam 15 MG capsule Commonly known as:  DALMANE Take 15 mg by mouth at bedtime as needed for sleep.   HYDROcodone-acetaminophen 5-325 MG tablet Commonly known as:  NORCO Take 1 tablet by mouth every 6 (six) hours as needed for severe pain. What changed:  Another medication with the same name was removed. Continue taking this medication, and follow the directions you see here.   ibuprofen 800 MG tablet Commonly known as:  ADVIL,MOTRIN Take 800 mg by mouth every 8 (eight) hours as needed for mild pain or moderate pain.   NOVOLOG MIX 70/30 FLEXPEN (70-30) 100 UNIT/ML FlexPen Generic drug:  insulin aspart protamine - aspart Inject 15 Units into the skin 2 (two) times daily.   ondansetron 4 MG disintegrating tablet Commonly known as:  ZOFRAN ODT Take 1 tablet (4 mg total) by mouth every 8 (eight) hours as needed for nausea or vomiting. What changed:  Another medication with the same name was removed. Continue taking this medication, and follow the directions you see here.   ondansetron 4 MG disintegrating tablet Commonly known as:  ZOFRAN ODT Take 1 tablet (4 mg total) by mouth every 8 (eight) hours as needed for nausea or vomiting. What changed:  Another medication with the same name was removed. Continue taking this medication, and follow the directions you see here.   traZODone 50 MG tablet Commonly known as:  DESYREL Take 50 mg by mouth at bedtime as needed.         DISCHARGE  INSTRUCTIONS:   DIET:  Cardiac diet  DISCHARGE CONDITION:  Stable  ACTIVITY:  Activity as tolerated  OXYGEN:  Home Oxygen: No.   Oxygen Delivery: room air  DISCHARGE LOCATION:  home   If you experience worsening of your admission symptoms, develop shortness of breath, life threatening emergency, suicidal or homicidal thoughts you must seek medical attention immediately by calling 911 or calling your MD immediately  if symptoms less severe.  You Must read complete instructions/literature along with all the possible adverse reactions/side effects for all the Medicines you take and that have been prescribed to you. Take any new Medicines after you have completely understood and accpet all the possible adverse reactions/side effects.   Please note  You were cared for by a hospitalist during your hospital stay. If you have any questions about your discharge medications or the care you received while you were in the hospital after you are discharged, you can call the unit and asked to speak with the hospitalist on call if the hospitalist that took care of you is not available. Once  you are discharged, your primary care physician will handle any further medical issues. Please note that NO REFILLS for any discharge medications will be authorized once you are discharged, as it is imperative that you return to your primary care physician (or establish a relationship with a primary care physician if you do not have one) for your aftercare needs so that they can reassess your need for medications and monitor your lab values.   DATA REVIEW:   CBC No results for input(s): WBC, HGB, HCT, PLT in the last 168 hours.  Chemistries  No results for input(s): NA, K, CL, CO2, GLUCOSE, BUN, CREATININE, CALCIUM, MG, AST, ALT, ALKPHOS, BILITOT in the last 168 hours.  Invalid input(s): GFRCGP  Cardiac Enzymes No results for input(s): TROPONINI in the last 168 hours.  Microbiology Results  Results for  orders placed or performed during the hospital encounter of 04/20/16  KOH prep     Status: None   Collection Time: 04/24/16  1:53 PM  Result Value Ref Range Status   Specimen Description ESOPHAGUS  Final   Special Requests Normal  Final   KOH Prep NO YEAST OR FUNGAL ELEMENTS SEEN  Final   Report Status 04/24/2016 FINAL  Final    RADIOLOGY:  No results found.    Management plans discussed with the patient, family and they are in agreement.  CODE STATUS:  Code Status History    Date Active Date Inactive Code Status Order ID Comments User Context   10/15/2016 11:50 PM 10/17/2016 12:56 AM Full Code [id number]  Dustin Flock, MD Inpatient    TOTAL TIME TAKING CARE OF THIS PATIENT: 40 minutes.    Henreitta Leber M.D on 10/24/2016 at 3:20 PM  Between 7am to 6pm - Pager - [phone]  After 6pm go to [email or web address] - Proofreader  [facility]  Office  [phone]  CC: Primary care physician; Marden Noble, MD

## 2016-12-10 ENCOUNTER — Emergency Department: Payer: Medicaid Other

## 2016-12-10 ENCOUNTER — Inpatient Hospital Stay: Payer: Medicaid Other

## 2016-12-10 ENCOUNTER — Inpatient Hospital Stay
Admission: EM | Admit: 2016-12-10 | Discharge: 2016-12-12 | DRG: 638 | Disposition: A | Discharge status: Home / Self Care | Payer: Medicaid Other | Attending: Internal Medicine | Admitting: Internal Medicine

## 2016-12-10 ENCOUNTER — Encounter: Payer: Self-pay | Admitting: Emergency Medicine

## 2016-12-10 ENCOUNTER — Other Ambulatory Visit: Payer: Self-pay

## 2016-12-10 DIAGNOSIS — E0810 Diabetes mellitus due to underlying condition with ketoacidosis without coma: Secondary | ICD-10-CM

## 2016-12-10 DIAGNOSIS — K760 Fatty (change of) liver, not elsewhere classified: Secondary | ICD-10-CM | POA: Diagnosis present

## 2016-12-10 DIAGNOSIS — E785 Hyperlipidemia, unspecified: Secondary | ICD-10-CM | POA: Diagnosis present

## 2016-12-10 DIAGNOSIS — Z87892 Personal history of anaphylaxis: Secondary | ICD-10-CM | POA: Diagnosis not present

## 2016-12-10 DIAGNOSIS — Z885 Allergy status to narcotic agent status: Secondary | ICD-10-CM | POA: Diagnosis not present

## 2016-12-10 DIAGNOSIS — F1010 Alcohol abuse, uncomplicated: Secondary | ICD-10-CM | POA: Diagnosis present

## 2016-12-10 DIAGNOSIS — I10 Essential (primary) hypertension: Secondary | ICD-10-CM | POA: Diagnosis present

## 2016-12-10 DIAGNOSIS — K56600 Partial intestinal obstruction, unspecified as to cause: Secondary | ICD-10-CM | POA: Diagnosis present

## 2016-12-10 DIAGNOSIS — N10 Acute tubulo-interstitial nephritis: Secondary | ICD-10-CM | POA: Diagnosis present

## 2016-12-10 DIAGNOSIS — Z880 Allergy status to penicillin: Secondary | ICD-10-CM

## 2016-12-10 DIAGNOSIS — J45909 Unspecified asthma, uncomplicated: Secondary | ICD-10-CM | POA: Diagnosis present

## 2016-12-10 DIAGNOSIS — Z79899 Other long term (current) drug therapy: Secondary | ICD-10-CM | POA: Diagnosis not present

## 2016-12-10 DIAGNOSIS — E871 Hypo-osmolality and hyponatremia: Secondary | ICD-10-CM | POA: Diagnosis present

## 2016-12-10 DIAGNOSIS — E860 Dehydration: Secondary | ICD-10-CM | POA: Diagnosis present

## 2016-12-10 DIAGNOSIS — Z888 Allergy status to other drugs, medicaments and biological substances status: Secondary | ICD-10-CM | POA: Diagnosis not present

## 2016-12-10 DIAGNOSIS — Z87440 Personal history of urinary (tract) infections: Secondary | ICD-10-CM

## 2016-12-10 DIAGNOSIS — E7800 Pure hypercholesterolemia, unspecified: Secondary | ICD-10-CM | POA: Diagnosis present

## 2016-12-10 DIAGNOSIS — N179 Acute kidney failure, unspecified: Secondary | ICD-10-CM | POA: Diagnosis present

## 2016-12-10 DIAGNOSIS — Z90710 Acquired absence of both cervix and uterus: Secondary | ICD-10-CM

## 2016-12-10 DIAGNOSIS — B9689 Other specified bacterial agents as the cause of diseases classified elsewhere: Secondary | ICD-10-CM | POA: Diagnosis present

## 2016-12-10 DIAGNOSIS — E876 Hypokalemia: Secondary | ICD-10-CM | POA: Diagnosis present

## 2016-12-10 DIAGNOSIS — Z794 Long term (current) use of insulin: Secondary | ICD-10-CM | POA: Diagnosis not present

## 2016-12-10 DIAGNOSIS — E1110 Type 2 diabetes mellitus with ketoacidosis without coma: Secondary | ICD-10-CM | POA: Diagnosis present

## 2016-12-10 DIAGNOSIS — R109 Unspecified abdominal pain: Secondary | ICD-10-CM

## 2016-12-10 DIAGNOSIS — Z87891 Personal history of nicotine dependence: Secondary | ICD-10-CM | POA: Diagnosis not present

## 2016-12-10 DIAGNOSIS — K2900 Acute gastritis without bleeding: Secondary | ICD-10-CM

## 2016-12-10 DIAGNOSIS — R1011 Right upper quadrant pain: Secondary | ICD-10-CM

## 2016-12-10 LAB — URINALYSIS, COMPLETE (UACMP) WITH MICROSCOPIC
Bacteria, UA: NONE SEEN
Bilirubin Urine: NEGATIVE
KETONES UR: 80 mg/dL — AB
Nitrite: NEGATIVE
PH: 5 (ref 5.0–8.0)
Protein, ur: >=300 mg/dL — AB
SPECIFIC GRAVITY, URINE: 1.014 (ref 1.005–1.030)

## 2016-12-10 LAB — GLUCOSE, CAPILLARY
GLUCOSE-CAPILLARY: 120 mg/dL — AB (ref 65–99)
GLUCOSE-CAPILLARY: 142 mg/dL — AB (ref 65–99)
GLUCOSE-CAPILLARY: 151 mg/dL — AB (ref 65–99)
GLUCOSE-CAPILLARY: 177 mg/dL — AB (ref 65–99)
GLUCOSE-CAPILLARY: 200 mg/dL — AB (ref 65–99)
Glucose-Capillary: 412 mg/dL — ABNORMAL HIGH (ref 65–99)
Glucose-Capillary: 393 mg/dL — ABNORMAL HIGH (ref 65–99)
Glucose-Capillary: 376 mg/dL — ABNORMAL HIGH (ref 65–99)
Glucose-Capillary: 307 mg/dL — ABNORMAL HIGH (ref 65–99)
Glucose-Capillary: 254 mg/dL — ABNORMAL HIGH (ref 65–99)
Glucose-Capillary: 155 mg/dL — ABNORMAL HIGH (ref 65–99)
Glucose-Capillary: 131 mg/dL — ABNORMAL HIGH (ref 65–99)
Glucose-Capillary: 124 mg/dL — ABNORMAL HIGH (ref 65–99)
Glucose-Capillary: 178 mg/dL — ABNORMAL HIGH (ref 65–99)
Glucose-Capillary: 206 mg/dL — ABNORMAL HIGH (ref 65–99)

## 2016-12-10 LAB — BASIC METABOLIC PANEL
Anion gap: 18 — ABNORMAL HIGH (ref 5–15)
Anion gap: 15 (ref 5–15)
Anion gap: 11 (ref 5–15)
BUN: 32 mg/dL — AB (ref 6–20)
BUN: 31 mg/dL — AB (ref 6–20)
BUN: 30 mg/dL — AB (ref 6–20)
CHLORIDE: 99 mmol/L — AB (ref 101–111)
CHLORIDE: 101 mmol/L (ref 101–111)
CHLORIDE: 102 mmol/L (ref 101–111)
CO2: 18 mmol/L — AB (ref 22–32)
CO2: 19 mmol/L — AB (ref 22–32)
CO2: 20 mmol/L — AB (ref 22–32)
CREATININE: 1.38 mg/dL — AB (ref 0.44–1.00)
CREATININE: 1.22 mg/dL — AB (ref 0.44–1.00)
CREATININE: 1.28 mg/dL — AB (ref 0.44–1.00)
Calcium: 8.2 mg/dL — ABNORMAL LOW (ref 8.9–10.3)
Calcium: 7.9 mg/dL — ABNORMAL LOW (ref 8.9–10.3)
Calcium: 7.7 mg/dL — ABNORMAL LOW (ref 8.9–10.3)
GFR calc Af Amer: 47 mL/min — ABNORMAL LOW (ref >60)
GFR calc Af Amer: 55 mL/min — ABNORMAL LOW (ref >60)
GFR calc Af Amer: 52 mL/min — ABNORMAL LOW (ref >60)
GFR calc non Af Amer: 41 mL/min — ABNORMAL LOW (ref >60)
GFR calc non Af Amer: 48 mL/min — ABNORMAL LOW (ref >60)
GFR calc non Af Amer: 45 mL/min — ABNORMAL LOW (ref >60)
GLUCOSE: 279 mg/dL — AB (ref 65–99)
Glucose, Bld: 162 mg/dL — ABNORMAL HIGH (ref 65–99)
Glucose, Bld: 156 mg/dL — ABNORMAL HIGH (ref 65–99)
POTASSIUM: 4.1 mmol/L (ref 3.5–5.1)
POTASSIUM: 3.6 mmol/L (ref 3.5–5.1)
POTASSIUM: 3.7 mmol/L (ref 3.5–5.1)
SODIUM: 135 mmol/L (ref 135–145)
SODIUM: 133 mmol/L — AB (ref 135–145)
Sodium: 135 mmol/L (ref 135–145)

## 2016-12-10 LAB — LACTIC ACID, PLASMA: Lactic Acid, Venous: 1.2 mmol/L (ref 0.5–1.9)

## 2016-12-10 LAB — CBC
HCT: 40.9 % (ref 35.0–47.0)
HEMATOCRIT: 36.3 % (ref 35.0–47.0)
Hemoglobin: 13.4 g/dL (ref 12.0–16.0)
Hemoglobin: 12.4 g/dL (ref 12.0–16.0)
MCH: 30.5 pg (ref 26.0–34.0)
MCH: 31.1 pg (ref 26.0–34.0)
MCHC: 32.9 g/dL (ref 32.0–36.0)
MCHC: 34 g/dL (ref 32.0–36.0)
MCV: 92.9 fL (ref 80.0–100.0)
MCV: 91.4 fL (ref 80.0–100.0)
PLATELETS: 238 10*3/uL (ref 150–440)
Platelets: 207 10*3/uL (ref 150–440)
RBC: 4.4 MIL/uL (ref 3.80–5.20)
RBC: 3.98 MIL/uL (ref 3.80–5.20)
RDW: 12.3 % (ref 11.5–14.5)
RDW: 12.3 % (ref 11.5–14.5)
WBC: 10.4 10*3/uL (ref 3.6–11.0)
WBC: 8.8 10*3/uL (ref 3.6–11.0)

## 2016-12-10 LAB — COMPREHENSIVE METABOLIC PANEL
ALT: 39 U/L (ref 14–54)
ANION GAP: 28 — AB (ref 5–15)
AST: 30 U/L (ref 15–41)
Albumin: 3.8 g/dL (ref 3.5–5.0)
Alkaline Phosphatase: 246 U/L — ABNORMAL HIGH (ref 38–126)
BILIRUBIN TOTAL: 1.6 mg/dL — AB (ref 0.3–1.2)
BUN: 35 mg/dL — ABNORMAL HIGH (ref 6–20)
CO2: 11 mmol/L — ABNORMAL LOW (ref 22–32)
Calcium: 9 mg/dL (ref 8.9–10.3)
Chloride: 92 mmol/L — ABNORMAL LOW (ref 101–111)
Creatinine, Ser: 1.75 mg/dL — ABNORMAL HIGH (ref 0.44–1.00)
GFR, EST AFRICAN AMERICAN: 36 mL/min — AB (ref >60)
GFR, EST NON AFRICAN AMERICAN: 31 mL/min — AB (ref >60)
Glucose, Bld: 422 mg/dL — ABNORMAL HIGH (ref 65–99)
POTASSIUM: 4.5 mmol/L (ref 3.5–5.1)
Sodium: 131 mmol/L — ABNORMAL LOW (ref 135–145)
TOTAL PROTEIN: 8.8 g/dL — AB (ref 6.5–8.1)

## 2016-12-10 LAB — MRSA PCR SCREENING: MRSA by PCR: NEGATIVE

## 2016-12-10 LAB — LIPASE, BLOOD: LIPASE: 71 U/L — AB (ref 11–51)

## 2016-12-10 MED ORDER — SODIUM CHLORIDE 0.9 % IV BOLUS (SEPSIS)
1000.0000 mL | Freq: Once | INTRAVENOUS | Status: AC
  Administered 2016-12-10: 1000 mL via INTRAVENOUS

## 2016-12-10 MED ORDER — ATORVASTATIN CALCIUM 20 MG PO TABS
10.0000 mg | ORAL_TABLET | Freq: Every day | ORAL | Status: DC
  Administered 2016-12-10 – 2016-12-11 (×2): 10 mg via ORAL
  Filled 2016-12-10 (×2): qty 1

## 2016-12-10 MED ORDER — CIPROFLOXACIN IN D5W 400 MG/200ML IV SOLN
400.0000 mg | Freq: Once | INTRAVENOUS | Status: AC
  Administered 2016-12-10: 400 mg via INTRAVENOUS
  Filled 2016-12-10: qty 200

## 2016-12-10 MED ORDER — SODIUM CHLORIDE 0.9 % IV SOLN
INTRAVENOUS | Status: DC
  Administered 2016-12-10: 12:00:00 via INTRAVENOUS

## 2016-12-10 MED ORDER — DEXTROSE-NACL 5-0.45 % IV SOLN: INTRAVENOUS | Status: DC

## 2016-12-10 MED ORDER — ADULT MULTIVITAMIN W/MINERALS CH
1.0000 | ORAL_TABLET | Freq: Every day | ORAL | Status: DC
  Administered 2016-12-10 – 2016-12-12 (×3): 1 via ORAL
  Filled 2016-12-10 (×3): qty 1

## 2016-12-10 MED ORDER — DEXTROSE-NACL 5-0.45 % IV SOLN
INTRAVENOUS | Status: DC
  Administered 2016-12-10: 15:00:00 via INTRAVENOUS

## 2016-12-10 MED ORDER — DICYCLOMINE HCL 20 MG PO TABS
20.0000 mg | ORAL_TABLET | Freq: Three times a day (TID) | ORAL | Status: DC | PRN
  Filled 2016-12-10: qty 1

## 2016-12-10 MED ORDER — FOLIC ACID 1 MG PO TABS
1.0000 mg | ORAL_TABLET | Freq: Every day | ORAL | Status: DC
Start: 2016-12-10 — End: 2016-12-12
  Administered 2016-12-10 – 2016-12-12 (×3): 1 mg via ORAL
  Filled 2016-12-10 (×3): qty 1

## 2016-12-10 MED ORDER — POTASSIUM CHLORIDE 10 MEQ/100ML IV SOLN
10.0000 meq | INTRAVENOUS | Status: AC
  Administered 2016-12-10 (×2): 10 meq via INTRAVENOUS
  Filled 2016-12-10 (×2): qty 100

## 2016-12-10 MED ORDER — INSULIN GLARGINE 100 UNIT/ML ~~LOC~~ SOLN
10.0000 [IU] | Freq: Every day | SUBCUTANEOUS | Status: DC
  Administered 2016-12-10 – 2016-12-12 (×3): 10 [IU] via SUBCUTANEOUS
  Filled 2016-12-10 (×3): qty 0.1

## 2016-12-10 MED ORDER — LORAZEPAM 2 MG/ML IJ SOLN: 2.0000 mg | INTRAMUSCULAR | Status: DC | PRN

## 2016-12-10 MED ORDER — SODIUM CHLORIDE 0.9 % IV SOLN: INTRAVENOUS | Status: AC

## 2016-12-10 MED ORDER — SODIUM CHLORIDE 0.9 % IV SOLN
INTRAVENOUS | Status: DC
  Administered 2016-12-10: 3.3 [IU]/h via INTRAVENOUS
  Filled 2016-12-10: qty 1

## 2016-12-10 MED ORDER — MORPHINE SULFATE (PF) 2 MG/ML IV SOLN
2.0000 mg | INTRAVENOUS | Status: DC | PRN
  Administered 2016-12-10 – 2016-12-12 (×8): 2 mg via INTRAVENOUS
  Filled 2016-12-10 (×8): qty 1

## 2016-12-10 MED ORDER — GI COCKTAIL ~~LOC~~
30.0000 mL | Freq: Once | ORAL | Status: AC
  Administered 2016-12-10: 30 mL via ORAL
  Filled 2016-12-10: qty 30

## 2016-12-10 MED ORDER — DIPHENHYDRAMINE HCL 25 MG PO CAPS
25.0000 mg | ORAL_CAPSULE | Freq: Four times a day (QID) | ORAL | Status: DC | PRN
  Administered 2016-12-12: 07:00:00 25 mg via ORAL
  Filled 2016-12-10 (×2): qty 1

## 2016-12-10 MED ORDER — THIAMINE HCL 100 MG/ML IJ SOLN
100.0000 mg | Freq: Every day | INTRAMUSCULAR | Status: DC
  Administered 2016-12-10 – 2016-12-12 (×3): 100 mg via INTRAVENOUS
  Filled 2016-12-10 (×2): qty 2
  Filled 2016-12-10: qty 1

## 2016-12-10 MED ORDER — CIPROFLOXACIN IN D5W 400 MG/200ML IV SOLN
400.0000 mg | INTRAVENOUS | Status: DC
Start: 2016-12-11 — End: 2016-12-11
  Filled 2016-12-10: qty 200

## 2016-12-10 MED ORDER — ONDANSETRON HCL 4 MG/2ML IJ SOLN
4.0000 mg | Freq: Once | INTRAMUSCULAR | Status: AC
  Administered 2016-12-10: 4 mg via INTRAVENOUS
  Filled 2016-12-10: qty 2

## 2016-12-10 MED ORDER — MORPHINE SULFATE (PF) 4 MG/ML IV SOLN
4.0000 mg | Freq: Once | INTRAVENOUS | Status: AC
  Administered 2016-12-10: 4 mg via INTRAVENOUS
  Filled 2016-12-10: qty 1

## 2016-12-10 MED ORDER — AMLODIPINE BESYLATE 5 MG PO TABS
5.0000 mg | ORAL_TABLET | Freq: Every day | ORAL | Status: DC
  Administered 2016-12-10 – 2016-12-12 (×3): 5 mg via ORAL
  Filled 2016-12-10 (×3): qty 1

## 2016-12-10 MED ORDER — ONDANSETRON HCL 4 MG/2ML IJ SOLN
4.0000 mg | Freq: Four times a day (QID) | INTRAMUSCULAR | Status: DC | PRN
  Administered 2016-12-11: 4 mg via INTRAVENOUS
  Filled 2016-12-10: qty 2

## 2016-12-10 MED ORDER — HYDROCODONE-ACETAMINOPHEN 5-325 MG PO TABS
1.0000 | ORAL_TABLET | Freq: Four times a day (QID) | ORAL | Status: DC | PRN
  Administered 2016-12-10 – 2016-12-12 (×6): 1 via ORAL
  Filled 2016-12-10 (×7): qty 1

## 2016-12-10 MED ORDER — ENOXAPARIN SODIUM 40 MG/0.4ML ~~LOC~~ SOLN
40.0000 mg | SUBCUTANEOUS | Status: DC
  Administered 2016-12-10 – 2016-12-11 (×2): 40 mg via SUBCUTANEOUS
  Filled 2016-12-10 (×2): qty 0.4

## 2016-12-10 MED ORDER — INSULIN ASPART 100 UNIT/ML ~~LOC~~ SOLN
0.0000 [IU] | Freq: Three times a day (TID) | SUBCUTANEOUS | Status: DC
  Administered 2016-12-11: 17:00:00 2 [IU] via SUBCUTANEOUS
  Administered 2016-12-11: 5 [IU] via SUBCUTANEOUS
  Administered 2016-12-11: 3 [IU] via SUBCUTANEOUS
  Administered 2016-12-12: 12:00:00 8 [IU] via SUBCUTANEOUS
  Administered 2016-12-12: 5 [IU] via SUBCUTANEOUS
  Filled 2016-12-10 (×5): qty 1

## 2016-12-10 MED ORDER — CYCLOBENZAPRINE HCL 10 MG PO TABS
5.0000 mg | ORAL_TABLET | Freq: Two times a day (BID) | ORAL | Status: DC
  Administered 2016-12-10 – 2016-12-12 (×5): 5 mg via ORAL
  Filled 2016-12-10 (×4): qty 0.5
  Filled 2016-12-10: qty 1

## 2016-12-10 MED ORDER — IOPAMIDOL (ISOVUE-300) INJECTION 61%
15.0000 mL | INTRAVENOUS | Status: AC
  Administered 2016-12-10 (×2): 15 mL via ORAL

## 2016-12-10 NOTE — Consult Note (Signed)
Name: Karen [name] MRN: [id number] DOB: 1955/08/14    ADMISSION DATE:  12/10/2016 CONSULTATION DATE: 12/10/2016  REFERRING MD : Dr. Bridgett Larsson   CHIEF COMPLAINT: Abdominal Pain and Nausea/Vomiting   BRIEF PATIENT DESCRIPTION:  61 yo female admitted 12/6 with vomiting coffee ground emesis, RUQ abdominal pain, and DKA requiring insulin gtt   SIGNIFICANT EVENTS  12/6-Pt admitted to stepdown unit   STUDIES:  [place] Abd Limited RUQ 12/6>>Mildly increased hepatic echotexture most compatible with fatty infiltrative change. Normal appearance of the gallbladder and common bile duct. CT Abd Pelvis 12/6>>  HISTORY OF PRESENT ILLNESS:   This is a 61 yo female with a PMH of Hypokalemia, HTN, Hypercholesteremia, Emphysematous Cystitis, DKA, Diabetes Mellitus, Depression, Asthma, ETOH Abuse, and Alcoholic Pancreatitis.  She presented to [facility] ER 12/6 via EMS with c/o vomiting coffee ground emesis, nausea, dizziness, headache, and RUQ abdominal pain onset of symptoms 12/4.  In the ER lab results revealed the pt was in DKA, therefore she received 2L NS bolus and insulin gtt initiated.  According to the pt she takes her insulin as prescribed, however she did not check her blood sugar yesterday.  Abdominal Xray revealed possible partial small bowel obstruction, therefore CT Abd Pelvis pending.  She does continue to drink three 40 ounce beers weekly, her last alcoholic beverage was 38/2/50. She was subsequently admitted by hospitalist team to the stepdown unit for further workup and treatment.   PAST MEDICAL HISTORY :   has a past medical history of Alcoholic pancreatitis, Asthma, Depression, Diabetes mellitus without complication ([place]), DKA (diabetic ketoacidoses) ([place]) (02/17/2015), Emphysematous cystitis, Hypercholesteremia, Hypertension, and Hypokalemia.  has a past surgical history that includes Abdominal hysterectomy (1996); Appendectomy (1997); Thyroid surgery (2013); Hand surgery (1988); and  Esophagogastroduodenoscopy (egd) with propofol (N/A, 04/24/2016). Prior to Admission medications   Medication Sig Start Date End Date Taking? Authorizing Provider  amLODipine (NORVASC) 5 MG tablet Take 5 mg by mouth daily.   Yes [provider]  atorvastatin (LIPITOR) 10 MG tablet Take 10 mg by mouth daily.   Yes [provider]  cyclobenzaprine (FLEXERIL) 5 MG tablet Take 5 mg by mouth 2 (two) times daily.   Yes [provider]  flurazepam (DALMANE) 15 MG capsule Take 15 mg by mouth at bedtime as needed for sleep.   Yes [provider]  insulin aspart protamine - aspart (NOVOLOG MIX 70/30 FLEXPEN) (70-30) 100 UNIT/ML FlexPen Inject 15 Units into the skin 2 (two) times daily.   Yes [provider]  dicyclomine (BENTYL) 20 MG tablet Take 1 tablet (20 mg total) by mouth 3 (three) times daily as needed for spasms. 06/11/16   Carrie Mew, MD  diphenhydrAMINE (BENADRYL) 25 MG tablet Take 25 mg by mouth every 6 (six) hours as needed.    [provider]  HYDROcodone-acetaminophen (NORCO) 5-325 MG tablet Take 1 tablet by mouth every 6 (six) hours as needed for severe pain. 10/16/16   Henreitta Leber, MD  ibuprofen (ADVIL,MOTRIN) 800 MG tablet Take 800 mg by mouth every 8 (eight) hours as needed for mild pain or moderate pain.    [provider]  ondansetron (ZOFRAN ODT) 4 MG disintegrating tablet Take 1 tablet (4 mg total) by mouth every 8 (eight) hours as needed for nausea or vomiting. 10/16/16   Henreitta Leber, MD   Allergies  Allergen Reactions  . Penicillins Anaphylaxis    Has patient had a PCN reaction causing immediate rash, facial/tongue/throat swelling, SOB or lightheadedness with hypotension:  Yes Has patient had a PCN reaction causing severe rash involving mucus membranes or skin necrosis: No Has patient had a PCN reaction that required hospitalization No Has patient had a PCN reaction occurring within the last 10 years: No If  all of the above answers are "NO", then may proceed with Cephalosporin use.  . Reglan [Metoclopramide] Other (See Comments)    Hypotension, shortness of breath  . Percocet [Oxycodone-Acetaminophen] Rash    FAMILY HISTORY:  Family history is unknown by patient. SOCIAL HISTORY:  reports that she has quit smoking. she has never used smokeless tobacco. She reports that she drinks alcohol. She reports that she does not use drugs.  REVIEW OF SYSTEMS: Positives in BOLD  Constitutional: Negative for fever, chills, weight loss, malaise/fatigue and diaphoresis.  HENT: Negative for hearing loss, ear pain, nosebleeds, congestion, sore throat, neck pain, tinnitus and ear discharge.   Eyes: Negative for blurred vision, double vision, photophobia, pain, discharge and redness.  Respiratory: Negative for cough, hemoptysis, sputum production, shortness of breath, wheezing and stridor.   Cardiovascular: Negative for chest pain, palpitations, orthopnea, claudication, leg swelling and PND.  Gastrointestinal: heartburn, nausea, vomiting, RUQ abdominal pain, diarrhea, constipation, blood in stool and melena.  Genitourinary: Negative for dysuria, urgency, frequency, hematuria and flank pain.  Musculoskeletal: Negative for myalgias, back pain, joint pain and falls.  Skin: Negative for itching and rash.  Neurological: dizziness, tingling, tremors, sensory change, speech change, focal weakness, seizures, loss of consciousness, weakness and headaches.  Endo/Heme/Allergies: Negative for environmental allergies and polydipsia. Does not bruise/bleed easily.  SUBJECTIVE:  Pt c/o RUQ abdominal pain and nausea.  VITAL SIGNS: Temp:  [98.3 F (36.8 C)] 98.3 F (36.8 C) (12/06 0713) Pulse Rate:  [100-106] 106 (12/06 0800) Resp:  [16-26] 18 (12/06 0800) BP: (129-143)/(76-92) 139/78 (12/06 0800) SpO2:  [85 %-100 %] 85 % (12/06 0800) Weight:  [49.9 kg (110 lb)] 49.9 kg (110 lb) (12/06 0715)  PHYSICAL  EXAMINATION: General: well developed female resting in bed, NAD  Neuro: alert and oriented, follows commands  HEENT: supple, no JVD  Cardiovascular: s1s2, rrr, no M/R/G Lungs: clear throughout, even, non labored  Abdomen: distended, taut, +BS x4  Musculoskeletal: normal bulk and tone, no edema  Skin: intact no rashes or lesions   Recent Labs  Lab 12/10/16 0725  NA 131*  K 4.5  CL 92*  CO2 11*  BUN 35*  CREATININE 1.75*  GLUCOSE 422*   Recent Labs  Lab 12/10/16 0725  HGB 13.4  HCT 40.9  WBC 10.4  PLT 238   Dg Abd 2 Views  Result Date: 12/10/2016 CLINICAL DATA:  Abdominal pain.  Vomiting and diarrhea. EXAM: ABDOMEN - 2 VIEW COMPARISON:  CT 10/15/2016 . FINDINGS: Soft tissue structures are unremarkable. Slightly prominent air-filled loops of small bowel noted. Colonic gas pattern normal. Partial small-bowel obstruction cannot be completely excluded. Follow-up exam suggested.No free air. Pelvic calcifications consistent phleboliths . IMPRESSION: Slightly prominent air-filled loops of small bowel noted. Partial small-bowel obstruction cannot be excluded. Follow-up exam suggested. Electronically Signed   By: Marcello Moores  Register   On: 12/10/2016 09:18   US Abdomen Limited Ruq  Result Date: 12/10/2016 CLINICAL DATA:  Two days of right upper quadrant pain. EXAM: ULTRASOUND ABDOMEN LIMITED RIGHT UPPER QUADRANT COMPARISON:  Abdominopelvic CT scan of October 15, 2016 FINDINGS: Gallbladder: No gallstones or wall thickening visualized. No sonographic Murphy sign noted by sonographer. Common bile duct: Diameter: 2.3 mm Liver: The hepatic echotexture is mildly increased diffusely. There is no  focal mass or ductal dilation. The surface contour of the liver is normal. Portal vein is patent on color Doppler imaging with normal direction of blood flow towards the liver. IMPRESSION: Mildly increased hepatic echotexture most compatible with fatty infiltrative change. Normal appearance of the gallbladder  and common bile duct. Electronically Signed   By: David  Martinique M.D.   On: 12/10/2016 08:46    ASSESSMENT / PLAN: Diabetic Ketoacidosis  Pseudohyponatremia in the setting of DKA Acute renal failure  UTI  Elevated alk phosphate and lipase  Abdominal Pain Nausea/Vomiting  Possible partial small bowel obstruction Hx: ETOH Abuse, Alcoholic Pancreatitis, and HTN   P: Continue insulin gtt until anion gap closed and serum CO2 >20 BMP q4hrs and CBG's q1hr while on insulin gtt NS _0  ml/hr then will transition to D5W1/2NS _1  ml/hr  Trend WBC and monitor fever curve Trend PCT's Follow cultures  Continue ciprofloxacin   Trend CMP Replace electrolytes as indicated  Monitor UOP CT Abd Pelvis pending  Keep NPO for now  Prn zofran for nausea and vomiting  Prn morphine and norco for pain management  Lovenox for VTE prophylaxis  Trend CBC Monitor for s/sx of bleeding  Continue outpatient cardiac medications Continuous telemetry monitoring  CIWA protocol Will start thiamine, folic acid, and mvi   Marda Stalker, [place] Pager [phone] (please enter 7 digits) PCCM Consult Pager [phone] (please enter 7 digits)

## 2016-12-10 NOTE — ED Provider Notes (Signed)
[facility] Emergency Department Provider Note ____________________________________________   I have reviewed the triage vital signs and the triage nursing note.  HISTORY  Chief Complaint Emesis and Abdominal Pain   Historian Patient  HPI Karen [name] is a 61 y.o. female with a history of diabetes, alcoholic pancreatitis, and hypertension, presents due to vomiting yesterday, clear, and then this morning dark brown coffee-ground emesis associated with some right-sided right lower abdomen and right upper quadrant pain.  No known history of liver disease or history of biliary colic.  Denies fever.  Denies diarrhea.  No lower abdominal pain.  No coughing or chest pain or trouble breathing.  She is now having a headache after she has been heaving and vomiting, generalized.  No neurologic symptoms such as weakness, numbness, confusion or altered mental status, or passing out.   Past Medical History:  Diagnosis Date  . Alcoholic pancreatitis   . Asthma   . Depression   . Diabetes mellitus without complication ([place])   . DKA (diabetic ketoacidoses) ([place]) 02/17/2015  . Emphysematous cystitis   . Hypercholesteremia   . Hypertension   . Hypokalemia     Patient Active Problem List   Diagnosis Date Noted  . Nausea and vomiting   . Esophageal candidiasis ([place])   . Intractable vomiting with nausea   . Hypertensive urgency 04/21/2016  . Abdominal pain   . Acute renal failure ([place])   . Nausea vomiting and diarrhea   . Smoker   . Poorly controlled type 2 diabetes mellitus ([place])   . Acute renal insufficiency 10/29/2015  . Elevated troponin 10/29/2015  . Urinary tract infection without hematuria 10/29/2015  . Left flank pain 10/29/2015  . Malignant essential hypertension 10/29/2015  . DKA (diabetic ketoacidoses) ([place]) 02/17/2015  . Emphysematous cystitis     Past Surgical History:  Procedure Laterality Date  . ABDOMINAL HYSTERECTOMY  1996  . APPENDECTOMY  1997   . ESOPHAGOGASTRODUODENOSCOPY (EGD) WITH PROPOFOL N/A 04/24/2016   Procedure: ESOPHAGOGASTRODUODENOSCOPY (EGD) WITH PROPOFOL;  Surgeon: [name], MD;  Location: [facility] ENDOSCOPY;  Service: Endoscopy;  Laterality: N/A;  . HAND SURGERY  1988  . THYROID SURGERY  2013    Prior to Admission medications   Medication Sig Start Date End Date Taking? Authorizing Provider  amLODipine (NORVASC) 5 MG tablet Take 5 mg by mouth daily.   Yes [provider]  atorvastatin (LIPITOR) 10 MG tablet Take 10 mg by mouth daily.   Yes [provider]  cyclobenzaprine (FLEXERIL) 5 MG tablet Take 5 mg by mouth 2 (two) times daily.   Yes [provider]  flurazepam (DALMANE) 15 MG capsule Take 15 mg by mouth at bedtime as needed for sleep.   Yes [provider]  insulin aspart protamine - aspart (NOVOLOG MIX 70/30 FLEXPEN) (70-30) 100 UNIT/ML FlexPen Inject 15 Units into the skin 2 (two) times daily.   Yes [provider]  dicyclomine (BENTYL) 20 MG tablet Take 1 tablet (20 mg total) by mouth 3 (three) times daily as needed for spasms. 06/11/16   Carrie Mew, MD  diphenhydrAMINE (BENADRYL) 25 MG tablet Take 25 mg by mouth every 6 (six) hours as needed.    [provider]  HYDROcodone-acetaminophen (NORCO) 5-325 MG tablet Take 1 tablet by mouth every 6 (six) hours as needed for severe pain. 10/16/16   Henreitta Leber, MD  ibuprofen (ADVIL,MOTRIN) 800 MG tablet Take 800 mg by mouth every 8 (eight) hours as needed for mild pain or  moderate pain.    [provider]  ondansetron (ZOFRAN ODT) 4 MG disintegrating tablet Take 1 tablet (4 mg total) by mouth every 8 (eight) hours as needed for nausea or vomiting. 10/16/16   Henreitta Leber, MD    Allergies  Allergen Reactions  . Penicillins Anaphylaxis    Has patient had a PCN reaction causing immediate rash, facial/tongue/throat swelling, SOB or lightheadedness with hypotension: Yes Has patient had a PCN  reaction causing severe rash involving mucus membranes or skin necrosis: No Has patient had a PCN reaction that required hospitalization No Has patient had a PCN reaction occurring within the last 10 years: No If all of the above answers are "NO", then may proceed with Cephalosporin use.  . Reglan [Metoclopramide] Other (See Comments)    Hypotension, shortness of breath  . Percocet [Oxycodone-Acetaminophen] Rash    Family History  Family history unknown: Yes    Social History Social History   Tobacco Use  . Smoking status: Former Research scientist (life sciences)  . Smokeless tobacco: Never Used  Substance Use Topics  . Alcohol use: Yes    Comment: beers 3 times per week  . Drug use: No    Review of Systems  Constitutional: Negative for fever. Eyes: Negative for visual changes. ENT: Negative for sore throat. Cardiovascular: Negative for chest pain. Respiratory: Negative for shortness of breath. Gastrointestinal: Vomiting and abdominal pain per HPI.Marland Kitchen Genitourinary: Negative for dysuria. Musculoskeletal: Negative for back pain. Skin: Negative for rash. Neurological: Positive for mild to moderate global headache.  ____________________________________________   PHYSICAL EXAM:  VITAL SIGNS: ED Triage Vitals  Enc Vitals Group     BP 12/10/16 0713 (!) 129/92     Pulse Rate 12/10/16 0713 (!) 104     Resp 12/10/16 0713 16     Temp 12/10/16 0713 98.3 F (36.8 C)     Temp Source 12/10/16 0713 Oral     SpO2 12/10/16 0713 100 %     Weight 12/10/16 0715 110 lb (49.9 kg)     Height 12/10/16 0715 5\' 1"  (1.549 m)     Head Circumference --      Peak Flow --      Pain Score 12/10/16 0719 9     Pain Loc --      Pain Edu? --      Excl. in [place]? --      Constitutional: Alert and oriented. Well appearing and in no distress. HEENT   Head: Normocephalic and atraumatic.      Eyes: Conjunctivae are normal. Pupils equal and round.       Ears:         Nose: No congestion/rhinnorhea.   Mouth/Throat:  Mucous membranes are moist.   Neck: No stridor. Cardiovascular/Chest: Normal rate, regular rhythm.  No murmurs, rubs, or gallops. Respiratory: Normal respiratory effort without tachypnea nor retractions. Breath sounds are clear and equal bilaterally. No wheezes/rales/rhonchi. Gastrointestinal: Soft. No distention, no guarding, no rebound.  Mild to moderate tenderness to palpation in the mid abdomen and right lateral abdomen.  No lower abdominal tenderness. Genitourinary/rectal:Deferred Musculoskeletal: Nontender with normal range of motion in all extremities. No joint effusions.  No lower extremity tenderness.  No edema. Neurologic:  Normal speech and language. No gross or focal neurologic deficits are appreciated. Skin:  Skin is warm, dry and intact. No rash noted. Psychiatric: Mood and affect are normal. Speech and behavior are normal. Patient exhibits appropriate insight and judgment.   ____________________________________________  LABS (pertinent positives/negatives) I, Lisa Roca,  MD the attending physician have reviewed the labs noted below.  Labs Reviewed  URINALYSIS, COMPLETE (UACMP) WITH MICROSCOPIC - Abnormal; Notable for the following components:      Result Value   Color, Urine YELLOW (*)    APPearance CLOUDY (*)    Glucose, UA >=500 (*)    Hgb urine dipstick SMALL (*)    Ketones, ur 80 (*)    Protein, ur >=300 (*)    Leukocytes, UA LARGE (*)    Squamous Epithelial / LPF 0-5 (*)    All other components within normal limits  COMPREHENSIVE METABOLIC PANEL - Abnormal; Notable for the following components:   Sodium 131 (*)    Chloride 92 (*)    CO2 11 (*)    Glucose, Bld 422 (*)    BUN 35 (*)    Creatinine, Ser 1.75 (*)    Total Protein 8.8 (*)    Alkaline Phosphatase 246 (*)    Total Bilirubin 1.6 (*)    GFR calc non Af Amer 31 (*)    GFR calc Af Amer 36 (*)    Anion gap 28 (*)    All other components within normal limits  LIPASE, BLOOD - Abnormal; Notable for  the following components:   Lipase 71 (*)    All other components within normal limits  GLUCOSE, CAPILLARY - Abnormal; Notable for the following components:   Glucose-Capillary 412 (*)    All other components within normal limits  URINE CULTURE  CULTURE, BLOOD (ROUTINE X 2)  CULTURE, BLOOD (ROUTINE X 2)  CBC  CBG MONITORING, ED    ____________________________________________    EKG I, Lisa Roca, MD, the attending physician have personally viewed and interpreted all ECGs.  101 bpm.  Sinus tachycardia.  Narrow cares.  Normal axis.  Normal ST and T wave ____________________________________________  RADIOLOGY All Xrays were viewed by me.  Imaging interpreted by Radiologist, and I, Lisa Roca, MD the attending physician have reviewed the radiologist interpretation noted below.  DG abd 2 view: IMPRESSION: Slightly prominent air-filled loops of small bowel noted. Partial small-bowel obstruction cannot be excluded. Follow-up exam suggested.  RUQ [place]: IMPRESSION: Mildly increased hepatic echotexture most compatible with fatty infiltrative change. Normal appearance of the gallbladder and common bile duct. __________________________________________  PROCEDURES  Procedure(s) performed: None  Critical Care performed: None   ____________________________________________  ED COURSE / ASSESSMENT AND PLAN  Pertinent labs & imaging results that were available during my care of the patient were reviewed by me and considered in my medical decision making (see chart for details).    Patient overall well-appearing, but reporting epigastric discomfort with multiple episodes of vomiting.  When I reviewed her chart history, positive for prior alcoholic pancreatitis.  She also has diabetes and history of DKA.  Laboratory studies indicate acute renal failure, likely low volume state.  Patient already received 1 L.  Laboratory studies indicate DKA.  Patient will be started on the  glucose stabilizer and admitted to hospitalist for further management.  Unclear inciting factor, but I suspect abdominal discomfort is from gastritis.  Unlikely bleeding ulcer with stable hemoglobin.  [place] overall reassuring for no gallstones or suspicion for cholecystitis.  Abd xrays without signs for obstruction on my view.  No elevated WBC.  Not recommending CT abd at this point.  Consulted hospitalist for admission.  Reviewed Abd xray result read by radiologist -- questionable partial sbo -- recommending additional imaging.  Will place order for CT abd/pelvis- without contrast due to decr  gft/ acute renal failure.  I discussed with Dr. Bridgett Larsson, hospitalist, he feels the patient may have pyelonephritis given the right sided flank discomfort, equivocal urinalysis, but a history of complicated previous pyelonephritis and recommended blood culture and treatment of UTI, I did place order for Cipro given patient has penicillin allergy.  Ct abd pel result pending at time of hospitalist admission.  DIFFERENTIAL DIAGNOSIS: Differential diagnosis includes, but is not limited to, biliary disease (biliary colic, acute cholecystitis, cholangitis, choledocholithiasis, etc), intrathoracic causes for epigastric abdominal pain including ACS, gastritis, duodenitis, pancreatitis, small bowel or large bowel obstruction, abdominal aortic aneurysm, hernia, and gastritis.  CONSULTATIONS:  Hospitalist for admission.  Patient / Family / Caregiver informed of clinical course, medical decision-making process, and agree with plan.    ___________________________________________   FINAL CLINICAL IMPRESSION(S) / ED DIAGNOSES   Final diagnoses:  RUQ pain  Right lateral abdominal pain  Acute renal failure, unspecified acute renal failure type ([place])  Acute gastritis, presence of bleeding unspecified, unspecified gastritis type  Fatty liver  Diabetic ketoacidosis without coma associated with diabetes mellitus due to  underlying condition ([place])      ___________________________________________        Note: This dictation was prepared with Dragon dictation. Any transcriptional errors that result from this process are unintentional    Lisa Roca, MD 12/10/16 [phone]

## 2016-12-10 NOTE — Progress Notes (Signed)
Pharmacy Antibiotic Note  Karen [name] is a 61 y.o. female admitted on 12/10/2016 with UTI.  Pharmacy has been consulted for ciprofloxacin dosing. Patient has severe penicillin allergy. No h/o cephalosporin tolerance in EMR and patient does not recall ever tolerating a cephalosporin.   Plan: Ciprofloxacin 400 mg iv q 24 hours.   Height: 5\' 1"  (154.9 cm) Weight: 110 lb (49.9 kg) IBW/kg (Calculated) : 47.8  Temp (24hrs), Avg:98.1 F (36.7 C), Min:97.9 F (36.6 C), Max:98.3 F (36.8 C)  Recent Labs  Lab 12/10/16 0725 12/10/16 1251  WBC 10.4 8.8  CREATININE 1.75* 1.38*    Estimated Creatinine Clearance: 33.1 mL/min (A) (by C-G formula based on SCr of 1.38 mg/dL (H)).    Allergies  Allergen Reactions  . Penicillins Anaphylaxis    Has patient had a PCN reaction causing immediate rash, facial/tongue/throat swelling, SOB or lightheadedness with hypotension: Yes Has patient had a PCN reaction causing severe rash involving mucus membranes or skin necrosis: No Has patient had a PCN reaction that required hospitalization No Has patient had a PCN reaction occurring within the last 10 years: No If all of the above answers are "NO", then may proceed with Cephalosporin use.  . Reglan [Metoclopramide] Other (See Comments)    Hypotension, shortness of breath  . Percocet [Oxycodone-Acetaminophen] Rash    Antimicrobials this admission: Ciprofloxacin 12/6 >>  Dose adjustments this admission:  Microbiology results: 12/6 BCx: sent 12/6 UCx: sent   Thank you for allowing pharmacy to be a part of this patient's care.  [name] 12/10/2016 1:45 PM

## 2016-12-10 NOTE — ED Notes (Addendum)
Unable to obtain appropriate blood cultures due to previously starting antibiotics.  Dr. Bridgett Larsson notified.  Will obtain blood cultures post antibiotics.

## 2016-12-10 NOTE — ED Triage Notes (Signed)
Patient presents to the ED via EMS from home for nausea, vomiting and ruq abdominal pain.  Patient reports vomiting began yesterday and she states that last night she noticed multiple episodes of coffee ground emesis.  Patient is a diabetic and blood sugar was 415 by EMS.  Patient states she took her insulin like normal yesterday but she does not check her blood sugar and did not check it yesterday.  Patient is also complaining of headache and dizziness.  Patient is alert and oriented x 4 at this time.

## 2016-12-10 NOTE — H&P (Addendum)
[place] [facility] [place] NAME: Karen [name]    MR#:  [id number]  DATE OF BIRTH:  12-Feb-1955  DATE OF ADMISSION:  12/10/2016  PRIMARY CARE PHYSICIAN: Marden Noble, MD   REQUESTING/REFERRING PHYSICIAN: Lisa Roca, MD  CHIEF COMPLAINT:   Chief Complaint  Patient presents with  . Emesis  . Abdominal Pain   Fever, chills, nausea, vomiting and right abdominal pain and flank pain for 2 days. HISTORY OF PRESENT ILLNESS:  Karen [name]  is a 61 y.o. female with a known history of alcoholic pancreatitis, asthma, DKA, diabetes, and emphysematouscystitis, hypertension and hyperlipidemia.  The patient presented to the ED with above chief complaints.  She was found with DKA with BS 422, anion gap 28, ARF and UTI.   PAST MEDICAL HISTORY:   Past Medical History:  Diagnosis Date  . Alcoholic pancreatitis   . Asthma   . Depression   . Diabetes mellitus without complication ([place])   . DKA (diabetic ketoacidoses) ([place]) 02/17/2015  . Emphysematous cystitis   . Hypercholesteremia   . Hypertension   . Hypokalemia     PAST SURGICAL HISTORY:   Past Surgical History:  Procedure Laterality Date  . ABDOMINAL HYSTERECTOMY  1996  . APPENDECTOMY  1997  . ESOPHAGOGASTRODUODENOSCOPY (EGD) WITH PROPOFOL N/A 04/24/2016   Procedure: ESOPHAGOGASTRODUODENOSCOPY (EGD) WITH PROPOFOL;  Surgeon: [name], MD;  Location: [facility] ENDOSCOPY;  Service: Endoscopy;  Laterality: N/A;  . HAND SURGERY  1988  . THYROID SURGERY  2013    SOCIAL HISTORY:   Social History   Tobacco Use  . Smoking status: Former Research scientist (life sciences)  . Smokeless tobacco: Never Used  Substance Use Topics  . Alcohol use: Yes    Comment: beers 3 times per week    FAMILY HISTORY:   Family History  Family history unknown: Yes    DRUG ALLERGIES:   Allergies  Allergen Reactions  . Penicillins Anaphylaxis    Has patient had a PCN reaction causing immediate rash, facial/tongue/throat swelling, SOB or  lightheadedness with hypotension: Yes Has patient had a PCN reaction causing severe rash involving mucus membranes or skin necrosis: No Has patient had a PCN reaction that required hospitalization No Has patient had a PCN reaction occurring within the last 10 years: No If all of the above answers are "NO", then may proceed with Cephalosporin use.  . Reglan [Metoclopramide] Other (See Comments)    Hypotension, shortness of breath  . Percocet [Oxycodone-Acetaminophen] Rash    REVIEW OF SYSTEMS:   Review of Systems  Constitutional: Positive for chills, fever and malaise/fatigue.  HENT: Negative for sore throat.   Eyes: Positive for blurred vision. Negative for double vision.  Respiratory: Negative for cough, hemoptysis, shortness of breath, wheezing and stridor.   Cardiovascular: Negative for chest pain, palpitations, orthopnea and leg swelling.  Gastrointestinal: Positive for abdominal pain, nausea and vomiting. Negative for blood in stool, diarrhea and melena.  Genitourinary: Positive for dysuria and frequency. Negative for flank pain and hematuria.  Musculoskeletal: Negative for back pain and joint pain.  Skin: Negative for rash.  Neurological: Positive for weakness. Negative for dizziness, sensory change, focal weakness, seizures, loss of consciousness and headaches.  Endo/Heme/Allergies: Negative for polydipsia.  Psychiatric/Behavioral: Negative for depression. The patient is not nervous/anxious.     MEDICATIONS AT HOME:   Prior to Admission medications   Medication Sig Start Date End Date Taking? Authorizing Provider  amLODipine (NORVASC) 5 MG tablet Take 5 mg by mouth daily.  Yes [provider]  atorvastatin (LIPITOR) 10 MG tablet Take 10 mg by mouth daily.   Yes [provider]  cyclobenzaprine (FLEXERIL) 5 MG tablet Take 5 mg by mouth 2 (two) times daily.   Yes [provider]  flurazepam (DALMANE) 15 MG capsule Take 15 mg by mouth at bedtime as  needed for sleep.   Yes [provider]  insulin aspart protamine - aspart (NOVOLOG MIX 70/30 FLEXPEN) (70-30) 100 UNIT/ML FlexPen Inject 15 Units into the skin 2 (two) times daily.   Yes [provider]  dicyclomine (BENTYL) 20 MG tablet Take 1 tablet (20 mg total) by mouth 3 (three) times daily as needed for spasms. 06/11/16   Carrie Mew, MD  diphenhydrAMINE (BENADRYL) 25 MG tablet Take 25 mg by mouth every 6 (six) hours as needed.    [provider]  HYDROcodone-acetaminophen (NORCO) 5-325 MG tablet Take 1 tablet by mouth every 6 (six) hours as needed for severe pain. 10/16/16   Henreitta Leber, MD  ibuprofen (ADVIL,MOTRIN) 800 MG tablet Take 800 mg by mouth every 8 (eight) hours as needed for mild pain or moderate pain.    [provider]  ondansetron (ZOFRAN ODT) 4 MG disintegrating tablet Take 1 tablet (4 mg total) by mouth every 8 (eight) hours as needed for nausea or vomiting. 10/16/16   Henreitta Leber, MD      VITAL SIGNS:  Blood pressure 139/78, pulse (!) 106, temperature 98.3 F (36.8 C), temperature source Oral, resp. rate 18, height 5\' 1"  (1.549 m), weight 110 lb (49.9 kg), SpO2 (!) 85 %.  PHYSICAL EXAMINATION:  Physical Exam  GENERAL:  61 y.o.-year-old patient lying in the bed with no acute distress.  EYES: Pupils equal, round, reactive to light and accommodation. No scleral icterus. Extraocular muscles intact.  HEENT: Head atraumatic, normocephalic. Oropharynx and nasopharynx clear.  NECK:  Supple, no jugular venous distention. No thyroid enlargement, no tenderness.  LUNGS: Normal breath sounds bilaterally, no wheezing, rales,rhonchi or crepitation. No use of accessory muscles of respiration.  CARDIOVASCULAR: S1, S2 normal. No murmurs, rubs, or gallops.  ABDOMEN: Soft,  Tenderness on the right side and right flank. Bowel sounds present. No organomegaly or mass.  EXTREMITIES: No pedal edema, cyanosis, or clubbing.  NEUROLOGIC: Cranial  nerves II through XII are intact. Muscle strength 5/5 in all extremities. Sensation intact. Gait not checked.  PSYCHIATRIC: The patient is alert and oriented x 3.  SKIN: No obvious rash, lesion, or ulcer.   LABORATORY PANEL:   CBC Recent Labs  Lab 12/10/16 0725  WBC 10.4  HGB 13.4  HCT 40.9  PLT 238   ------------------------------------------------------------------------------------------------------------------  Chemistries  Recent Labs  Lab 12/10/16 0725  NA 131*  K 4.5  CL 92*  CO2 11*  GLUCOSE 422*  BUN 35*  CREATININE 1.75*  CALCIUM 9.0  AST 30  ALT 39  ALKPHOS 246*  BILITOT 1.6*   ------------------------------------------------------------------------------------------------------------------  Cardiac Enzymes No results for input(s): TROPONINI in the last 168 hours. ------------------------------------------------------------------------------------------------------------------  RADIOLOGY:  Dg Abd 2 Views  Result Date: 12/10/2016 CLINICAL DATA:  Abdominal pain.  Vomiting and diarrhea. EXAM: ABDOMEN - 2 VIEW COMPARISON:  CT 10/15/2016 . FINDINGS: Soft tissue structures are unremarkable. Slightly prominent air-filled loops of small bowel noted. Colonic gas pattern normal. Partial small-bowel obstruction cannot be completely excluded. Follow-up exam suggested.No free air. Pelvic calcifications consistent phleboliths . IMPRESSION: Slightly prominent air-filled loops of small bowel noted. Partial small-bowel obstruction cannot be excluded. Follow-up exam  suggested. Electronically Signed   By: Marcello Moores  Register   On: 12/10/2016 09:18   US Abdomen Limited Ruq  Result Date: 12/10/2016 CLINICAL DATA:  Two days of right upper quadrant pain. EXAM: ULTRASOUND ABDOMEN LIMITED RIGHT UPPER QUADRANT COMPARISON:  Abdominopelvic CT scan of October 15, 2016 FINDINGS: Gallbladder: No gallstones or wall thickening visualized. No sonographic Murphy sign noted by sonographer. Common  bile duct: Diameter: 2.3 mm Liver: The hepatic echotexture is mildly increased diffusely. There is no focal mass or ductal dilation. The surface contour of the liver is normal. Portal vein is patent on color Doppler imaging with normal direction of blood flow towards the liver. IMPRESSION: Mildly increased hepatic echotexture most compatible with fatty infiltrative change. Normal appearance of the gallbladder and common bile duct. Electronically Signed   By: David  Martinique M.D.   On: 12/10/2016 08:46      IMPRESSION AND PLAN:   DKA with diabetes 2. The patient will be admitted to stepdown unit. Start insulin drip, IV fluids support, follow-up BMP every 4 hours.  Acute pyelonephritis. Start Rocephin IV in the follow-up urine culture and blood culture.  Acute renal failure with dehydration due to above. Continue IV fluid support and follow-up BMP.  Elevated bilirubin. possible due to nausea vomiting.  Partial small-bowel obstruction cannot be excluded.  Follow-up CT of abdomen. N.p.o. with IV fluid support.  Fatty liver per abdominal ultrasound.  Discussed with Dr. Alva Garnet. All the records are reviewed and case discussed with ED provider. Management plans discussed with the patient, family and they are in agreement.  CODE STATUS: Full code  TOTAL CRITICAL TIME TAKING CARE OF THIS PATIENT: 56 minutes.    Demetrios Loll M.D on 12/10/2016 at 9:38 AM  Between 7am to 6pm - Pager - [phone]  After 6pm go to [email or web address] - Proofreader  [facility]  Office  [phone]  CC: Primary care physician; Marden Noble, MD   Note: This dictation was prepared with Dragon dictation along with smaller phrase technology. Any transcriptional errors that result from this process are unin

## 2016-12-11 DIAGNOSIS — E1110 Type 2 diabetes mellitus with ketoacidosis without coma: Principal | ICD-10-CM

## 2016-12-11 LAB — GLUCOSE, CAPILLARY
GLUCOSE-CAPILLARY: 150 mg/dL — AB (ref 65–99)
GLUCOSE-CAPILLARY: 203 mg/dL — AB (ref 65–99)
Glucose-Capillary: 171 mg/dL — ABNORMAL HIGH (ref 65–99)
Glucose-Capillary: 208 mg/dL — ABNORMAL HIGH (ref 65–99)
Glucose-Capillary: 170 mg/dL — ABNORMAL HIGH (ref 65–99)

## 2016-12-11 LAB — BASIC METABOLIC PANEL
Anion gap: 9 (ref 5–15)
BUN: 28 mg/dL — AB (ref 6–20)
CHLORIDE: 104 mmol/L (ref 101–111)
CO2: 22 mmol/L (ref 22–32)
CREATININE: 1.16 mg/dL — AB (ref 0.44–1.00)
Calcium: 7.6 mg/dL — ABNORMAL LOW (ref 8.9–10.3)
GFR calc Af Amer: 59 mL/min — ABNORMAL LOW (ref >60)
GFR calc non Af Amer: 50 mL/min — ABNORMAL LOW (ref >60)
Glucose, Bld: 157 mg/dL — ABNORMAL HIGH (ref 65–99)
POTASSIUM: 3.3 mmol/L — AB (ref 3.5–5.1)
Sodium: 135 mmol/L (ref 135–145)

## 2016-12-11 LAB — LIPID PANEL
CHOL/HDL RATIO: 6.3 ratio
Cholesterol: 339 mg/dL — ABNORMAL HIGH (ref 0–200)
HDL: 54 mg/dL (ref >40)
LDL Cholesterol: 246 mg/dL — ABNORMAL HIGH (ref 0–99)
TRIGLYCERIDES: 197 mg/dL — AB (ref <150)
VLDL: 39 mg/dL (ref 0–40)

## 2016-12-11 LAB — POTASSIUM: POTASSIUM: 4.7 mmol/L (ref 3.5–5.1)

## 2016-12-11 LAB — MAGNESIUM: MAGNESIUM: 2.3 mg/dL (ref 1.7–2.4)

## 2016-12-11 LAB — HEMOGLOBIN A1C
Hgb A1c MFr Bld: 10.3 % — ABNORMAL HIGH (ref 4.8–5.6)
Mean Plasma Glucose: 248.91 mg/dL

## 2016-12-11 MED ORDER — POTASSIUM CHLORIDE CRYS ER 20 MEQ PO TBCR
20.0000 meq | EXTENDED_RELEASE_TABLET | Freq: Once | ORAL | Status: AC
Start: 2016-12-11 — End: 2016-12-11
  Administered 2016-12-11: 20 meq via ORAL
  Filled 2016-12-11: qty 1

## 2016-12-11 MED ORDER — ATORVASTATIN CALCIUM 20 MG PO TABS: 40.0000 mg | ORAL_TABLET | Freq: Every day | ORAL | Status: DC

## 2016-12-11 MED ORDER — LORAZEPAM 1 MG PO TABS: 1.0000 mg | ORAL_TABLET | Freq: Four times a day (QID) | ORAL | Status: DC | PRN

## 2016-12-11 MED ORDER — LORAZEPAM 2 MG/ML IJ SOLN: 1.0000 mg | Freq: Four times a day (QID) | INTRAMUSCULAR | Status: DC | PRN

## 2016-12-11 MED ORDER — POTASSIUM CHLORIDE 10 MEQ/100ML IV SOLN
10.0000 meq | INTRAVENOUS | Status: DC
  Filled 2016-12-11 (×6): qty 100

## 2016-12-11 MED ORDER — POTASSIUM CHLORIDE CRYS ER 20 MEQ PO TBCR
20.0000 meq | EXTENDED_RELEASE_TABLET | Freq: Once | ORAL | Status: AC
  Administered 2016-12-11: 20 meq via ORAL
  Filled 2016-12-11: qty 1

## 2016-12-11 MED ORDER — PREMIER PROTEIN SHAKE
11.0000 [oz_av] | Freq: Two times a day (BID) | ORAL | Status: DC
  Administered 2016-12-12: 11 [oz_av] via ORAL

## 2016-12-11 MED ORDER — POTASSIUM CHLORIDE CRYS ER 20 MEQ PO TBCR
40.0000 meq | EXTENDED_RELEASE_TABLET | Freq: Once | ORAL | Status: AC
  Administered 2016-12-11: 40 meq via ORAL
  Filled 2016-12-11: qty 2

## 2016-12-11 MED ORDER — CIPROFLOXACIN IN D5W 400 MG/200ML IV SOLN
400.0000 mg | Freq: Two times a day (BID) | INTRAVENOUS | Status: DC
  Administered 2016-12-11 – 2016-12-12 (×3): 400 mg via INTRAVENOUS
  Filled 2016-12-11 (×5): qty 200

## 2016-12-11 NOTE — Progress Notes (Signed)
[place] at [place] NAME: Karen [name]    MR#:  [id number]  DATE OF BIRTH:  1955/08/11  SUBJECTIVE:  CHIEF COMPLAINT:   Chief Complaint  Patient presents with  . Emesis  . Abdominal Pain   She feels better but still has right-sided flank pain. REVIEW OF SYSTEMS:  Review of Systems  Constitutional: Negative for chills, fever and malaise/fatigue.  HENT: Negative for sore throat.   Eyes: Negative for blurred vision and double vision.  Respiratory: Negative for cough, hemoptysis, shortness of breath, wheezing and stridor.   Cardiovascular: Negative for chest pain, palpitations, orthopnea and leg swelling.  Gastrointestinal: Negative for abdominal pain, blood in stool, diarrhea, melena, nausea and vomiting.  Genitourinary: Positive for flank pain. Negative for dysuria and hematuria.  Musculoskeletal: Negative for back pain and joint pain.  Neurological: Negative for dizziness, sensory change, focal weakness, seizures, loss of consciousness, weakness and headaches.  Endo/Heme/Allergies: Negative for polydipsia.  Psychiatric/Behavioral: Negative for depression. The patient is not nervous/anxious.     DRUG ALLERGIES:   Allergies  Allergen Reactions  . Penicillins Anaphylaxis    Has patient had a PCN reaction causing immediate rash, facial/tongue/throat swelling, SOB or lightheadedness with hypotension: Yes Has patient had a PCN reaction causing severe rash involving mucus membranes or skin necrosis: No Has patient had a PCN reaction that required hospitalization No Has patient had a PCN reaction occurring within the last 10 years: No If all of the above answers are "NO", then may proceed with Cephalosporin use.  . Reglan [Metoclopramide] Other (See Comments)    Hypotension, shortness of breath  . Percocet [Oxycodone-Acetaminophen] Rash   VITALS:  Blood pressure 129/72, pulse 83, temperature 98.3 F (36.8 C), temperature source Oral,  resp. rate (!) 21, height 5\' 1"  (1.549 m), weight 119 lb 7 oz (54.2 kg), SpO2 97 %. PHYSICAL EXAMINATION:  Physical Exam  Constitutional: She is oriented to person, place, and time and well-developed, well-nourished, and in no distress.  HENT:  Head: Normocephalic.  Mouth/Throat: Oropharynx is clear and moist.  Eyes: Conjunctivae and EOM are normal. Pupils are equal, round, and reactive to light. No scleral icterus.  Neck: Normal range of motion. Neck supple. No JVD present. No tracheal deviation present.  Cardiovascular: Normal rate, regular rhythm and normal heart sounds. Exam reveals no gallop.  No murmur heard. Pulmonary/Chest: Effort normal and breath sounds normal. No respiratory distress. She has no wheezes. She has no rales.  Abdominal: Soft. Bowel sounds are normal. She exhibits no distension. There is tenderness. There is no rebound.  Musculoskeletal: Normal range of motion. She exhibits no edema or tenderness.  Neurological: She is alert and oriented to person, place, and time. No cranial nerve deficit.  Skin: No rash noted. No erythema.  Psychiatric: Affect normal.   LABORATORY PANEL:  Female CBC Recent Labs  Lab 12/10/16 1251  WBC 8.8  HGB 12.4  HCT 36.3  PLT 207   ------------------------------------------------------------------------------------------------------------------ Chemistries  Recent Labs  Lab 12/10/16 0725  12/11/16 0030 12/11/16 1100  NA 131*   < > 135  --   K 4.5   < > 3.3* 4.7  CL 92*   < > 104  --   CO2 11*   < > 22  --   GLUCOSE 422*   < > 157*  --   BUN 35*   < > 28*  --   CREATININE 1.75*   < > 1.16*  --  CALCIUM 9.0   < > 7.6*  --   AST 30  --   --   --   ALT 39  --   --   --   ALKPHOS 246*  --   --   --   BILITOT 1.6*  --   --   --    < > = values in this interval not displayed.   RADIOLOGY:  No results found. ASSESSMENT AND PLAN:   DKA with diabetes 2. improved with insulin drip, IV fluids support.  Acute  pyelonephritis. Continue cipro iv, f/u follow-up urine culture (GNR) and blood culture.  Acute renal failure with dehydration due to above. Improved with IV fluid support.  Elevated bilirubin. possible due to nausea vomiting.  Partial small-bowel obstruction On full liquid diet now.  Hypokalemia.  Improved with potassium supplement.  Fatty liver per abdominal ultrasound.  Hyperlipidemia.  LDL 246.  Continue Lipitor.  History of alcohol use and alcoholic pancreatitis.  ords are reviewed and case discussed with Care Management/Social Worker. Management plans discussed with the patient, family and they are in agreement.  CODE STATUS: Full Code  TOTAL TIME TAKING CARE OF THIS PATIENT: 33 minutes.   More than 50% of the time was spent in counseling/coordination of care: YES  POSSIBLE D/C IN 2 DAYS, DEPENDING ON CLINICAL CONDITION.   Demetrios Loll M.D on 12/11/2016 at 3:18 PM  Between 7am to 6pm - Pager - [phone]  After 6pm go to [email or web address] - [facility]

## 2016-12-11 NOTE — Progress Notes (Signed)
[facility] Progess Note    SYNOPSIS   61 yo female admitted 12/6 with vomiting coffee ground emesis, RUQ abdominal pain, and DKA requiring insulin gtt    ASSESSMENT/PLAN   DKA.  Patient's anion gap this morning is 9, being transitioned off of insulin infusion.  Starting clear liquid diet  Hematemesis.  No further evidence of active bleeding.  H&H stable, pending this morning's lab results.  History of alcohol use and alcoholic pancreatitis.  Lipase was 71 yesterday  Partial small bowel obstruction.  Passing gas, tolerating oral feedings so far   Name: Karen [name] MRN: [id number] DOB: 14-Jul-1955    ADMISSION DATE:  12/10/2016    SUBJECTIVE:   Patient, awake, alert, starting to take some oral intake, complaining of some abdominal discomfort and does not feel hungry.  She states she is generally improved  VITAL SIGNS: Temp:  [97.9 F (36.6 C)-98.4 F (36.9 C)] 98.2 F (36.8 C) (12/07 0200) Pulse Rate:  [72-106] 86 (12/07 0600) Resp:  [13-32] 32 (12/07 0600) BP: (86-159)/(58-104) 137/104 (12/07 0600) SpO2:  [85 %-100 %] 100 % (12/07 0600)  PHYSICAL EXAMINATION: Physical Examination:   VS: BP (!) 137/104 (BP Location: Left Arm)   Pulse 86   Temp 98.2 F (36.8 C) (Oral)   Resp (!) 32   Ht 5\' 1"  (1.549 m)   Wt 49.9 kg (110 lb)   SpO2 100%   BMI 20.78 kg/m   General Appearance: No distress  Neuro:without focal findings, mental status normal. HEENT: PERRLA, EOM intact. Pulmonary: normal breath sounds   CardiovascularNormal S1,S2.  No m/r/g.   Abdomen: Benign, Soft, non-tender. Skin:   warm, no rashes, no ecchymosis  Extremities: normal, no cyanosis, clubbing.    LABORATORY PANEL:   CBC Recent Labs  Lab 12/10/16 1251  WBC 8.8  HGB 12.4  HCT 36.3  PLT 207    Chemistries  Recent Labs  Lab 12/10/16 0725  12/11/16 0030  NA 131*   < > 135  K 4.5   < > 3.3*  CL 92*   < > 104  CO2 11*   < > 22  GLUCOSE 422*   < > 157*  BUN  35*   < > 28*  CREATININE 1.75*   < > 1.16*  CALCIUM 9.0   < > 7.6*  AST 30  --   --   ALT 39  --   --   ALKPHOS 246*  --   --   BILITOT 1.6*  --   --    < > = values in this interval not displayed.    Recent Labs  Lab 12/10/16 1919 12/10/16 2030 12/10/16 2125 12/10/16 2230 12/10/16 2350 12/11/16 0352  GLUCAP 142* 151* 178* 206* 177* 171*   No results for input(s): PHART, PCO2ART, PO2ART in the last 168 hours. Recent Labs  Lab 12/10/16 0725  AST 30  ALT 39  ALKPHOS 246*  BILITOT 1.6*  ALBUMIN 3.8    Cardiac Enzymes No results for input(s): TROPONINI in the last 168 hours.  RADIOLOGY:  Ct Abdomen Pelvis Wo Contrast  Result Date: 12/10/2016 CLINICAL DATA:  61 year old female with nausea and vomiting. Coffee ground emesis. Right lower quadrant pain. EXAM: CT ABDOMEN AND PELVIS WITHOUT CONTRAST TECHNIQUE: Multidetector CT imaging of the abdomen and pelvis was performed following the standard protocol without IV contrast. COMPARISON:  CT Abdomen and Pelvis 10/15/2016 and earlier. FINDINGS: Lower chest: Increased since October streaky and interstitial appearing bilateral lower  lobe opacity is nonspecific. No consolidation or pleural effusion. Lingula and right middle lobe remain clear. No pericardial effusion. Hepatobiliary: Hepatic steatosis.  Negative noncontrast gallbladder. Pancreas: The pancreas appears stable in the absence of IV contrast. No pancreatic inflammation is evident. Spleen: Negative. Adrenals/Urinary Tract: Normal adrenal glands. Bilateral perinephric stranding is stable since October and remains nonspecific. No nephrolithiasis. Negative course of both ureters which are intermittently prominent. Distended urinary bladder similar to the appearance in October with an estimated bladder volume of 760 mL. Stomach/Bowel: Rectum is stable and negative. The sigmoid colon is redundant and gas-filled bilateral lower abdomen. Decompressed left colon with unchanged mild  thickening of the left lateral Conal Foss show. Decompressed transverse colon with mild retained stool. Diverticulosis at the hepatic flexure and in the ascending colon but no active inflammation identified. Prior appendectomy. Negative cecum and terminal ileum. Gas-filled distal small bowel loops are nondilated. The stomach is decompressed. There is mild tortuosity of the duodenum which appears stable. The proximal small bowel loops are decompressed. But mid abdominal small bowel loops containing oral contrast are at the upper limits of normal to mildly dilated (28 mm diameter). There is no abrupt transition. No associated mesenteric stranding. No abdominal free fluid or free air. Vascular/Lymphatic: Vascular patency is not evaluated in the absence of IV contrast. Aortoiliac calcified atherosclerosis. No lymphadenopathy. Reproductive: Surgically absent uterus.  Stable, negative adnexa. Other: No pelvic free fluid. Musculoskeletal: No acute osseous abnormality identified. IMPRESSION: 1. Upper limits of normal to mildly dilated mid abdominal small bowel loops containing contrast, with a gradual transition distal small bowel. This most resembles a small bowel ileus. No associated bowel inflammation or free fluid. 2. Distended urinary bladder, 760 mL bladder volume. Query bladder outlet obstruction. 3. Increased but nonspecific lower lobe pulmonary opacity since October. This may be atelectasis but mild or developing lower lobe infection is difficult to exclude. 4. No pancreatic inflammation is evident. No other acute findings in the abdomen or pelvis. 5. Hepatic steatosis.  Aortic Atherosclerosis (ICD10-I70.0). Electronically Signed   By: Genevie Ann M.D.   On: 12/10/2016 14:48   Dg Abd 2 Views  Result Date: 12/10/2016 CLINICAL DATA:  Abdominal pain.  Vomiting and diarrhea. EXAM: ABDOMEN - 2 VIEW COMPARISON:  CT 10/15/2016 . FINDINGS: Soft tissue structures are unremarkable. Slightly prominent air-filled loops of  small bowel noted. Colonic gas pattern normal. Partial small-bowel obstruction cannot be completely excluded. Follow-up exam suggested.No free air. Pelvic calcifications consistent phleboliths . IMPRESSION: Slightly prominent air-filled loops of small bowel noted. Partial small-bowel obstruction cannot be excluded. Follow-up exam suggested. Electronically Signed   By: Marcello Moores  Register   On: 12/10/2016 09:18   US Abdomen Limited Ruq  Result Date: 12/10/2016 CLINICAL DATA:  Two days of right upper quadrant pain. EXAM: ULTRASOUND ABDOMEN LIMITED RIGHT UPPER QUADRANT COMPARISON:  Abdominopelvic CT scan of October 15, 2016 FINDINGS: Gallbladder: No gallstones or wall thickening visualized. No sonographic Murphy sign noted by sonographer. Common bile duct: Diameter: 2.3 mm Liver: The hepatic echotexture is mildly increased diffusely. There is no focal mass or ductal dilation. The surface contour of the liver is normal. Portal vein is patent on color Doppler imaging with normal direction of blood flow towards the liver. IMPRESSION: Mildly increased hepatic echotexture most compatible with fatty infiltrative change. Normal appearance of the gallbladder and common bile duct. Electronically Signed   By: David  Martinique M.D.   On: 12/10/2016 08:46    Hermelinda Dellen, DO 12/11/2016

## 2016-12-11 NOTE — Progress Notes (Signed)
Inpatient Diabetes Program Recommendations  AACE/ADA: New Consensus Statement on Inpatient Glycemic Control (2015)  Target Ranges:  Prepandial:   less than 140 mg/dL      Peak postprandial:   less than 180 mg/dL (1-2 hours)      Critically ill patients:  140 - 180 mg/dL   Lab Results  Component Value Date   GLUCAP 170 (H) 12/11/2016   HGBA1C 9.7 (H) 10/29/2015    Review of Glycemic ControlResults for SABRIYA, YONO (MRN [id number]) as of 12/11/2016 14:36  Ref. Range 12/10/2016 22:30 12/10/2016 23:50 12/11/2016 03:52 12/11/2016 07:51 12/11/2016 11:29  Glucose-Capillary Latest Ref Range: 65 - 99 mg/dL 206 (H) 177 (H) 171 (H) 208 (H) 170 (H)   Diabetes history: DKA-Type 2 DM Outpatient Diabetes medications: Novolog 70/30 15 units bid Current orders for Inpatient glycemic control:  Novolog moderate tid with meals, Lantus 10 units daily  Inpatient Diabetes Program Recommendations:    Spoke with patient regarding DM management.  She states that she did take her 70/30 insulin prior to admit. Patient requested information regarding nutritional supplements. She states that her blood sugars are usually in the 150's when she checks them at home. She is unaware of her last A1C.  She states that she also needs a new PCP b/c her's retired.  Will order dietician and care management consult for these needs.  She states she has no further questions/needs.    Thanks, Adah Perl, RN, BC-ADM Inpatient Diabetes Coordinator Pager [phone] (8a-5p)

## 2016-12-11 NOTE — Progress Notes (Addendum)
Pharmacy Antibiotic Note  Karen [name] is a 61 y.o. female admitted on 12/10/2016 with UTI.  Pharmacy has been consulted for ciprofloxacin dosing. Patient has severe penicillin allergy. No h/o cephalosporin tolerance in EMR and patient does not recall ever tolerating a cephalosporin.   Plan: Will transition patient to ciprofloxacin 400 mg iv q 12 hours with improvement in SCr. After discussion with Dr. Jefferson Fuel, will treat for a total of 3 days.   Height: 5\' 1"  (154.9 cm) Weight: 110 lb (49.9 kg) IBW/kg (Calculated) : 47.8  Temp (24hrs), Avg:98.3 F (36.8 C), Min:98.2 F (36.8 C), Max:98.4 F (36.9 C)  Recent Labs  Lab 12/10/16 0725 12/10/16 1251 12/10/16 1536 12/10/16 2008 12/11/16 0030  WBC 10.4 8.8  --   --   --   CREATININE 1.75* 1.38* 1.22* 1.28* 1.16*  LATICACIDVEN  --   --  1.2  --   --     Estimated Creatinine Clearance: 39.4 mL/min (A) (by C-G formula based on SCr of 1.16 mg/dL (H)).    Allergies  Allergen Reactions  . Penicillins Anaphylaxis    Has patient had a PCN reaction causing immediate rash, facial/tongue/throat swelling, SOB or lightheadedness with hypotension: Yes Has patient had a PCN reaction causing severe rash involving mucus membranes or skin necrosis: No Has patient had a PCN reaction that required hospitalization No Has patient had a PCN reaction occurring within the last 10 years: No If all of the above answers are "NO", then may proceed with Cephalosporin use.  . Reglan [Metoclopramide] Other (See Comments)    Hypotension, shortness of breath  . Percocet [Oxycodone-Acetaminophen] Rash    Antimicrobials this admission: Ciprofloxacin 12/6 >>  Dose adjustments this admission:  Microbiology results: 12/6 BCx: NGTD 12/6 UCx: GNR 12/6 MRSA PCR: negative Thank you for allowing pharmacy to be a part of this patient's care.  [name] 12/11/2016 1:21 PM

## 2016-12-12 ENCOUNTER — Other Ambulatory Visit: Payer: Self-pay

## 2016-12-12 LAB — GLUCOSE, CAPILLARY
GLUCOSE-CAPILLARY: 268 mg/dL — AB (ref 65–99)
Glucose-Capillary: 229 mg/dL — ABNORMAL HIGH (ref 65–99)

## 2016-12-12 LAB — URINE CULTURE

## 2016-12-12 MED ORDER — CIPROFLOXACIN HCL 500 MG PO TABS: 500.0000 mg | ORAL_TABLET | Freq: Two times a day (BID) | ORAL | Status: DC

## 2016-12-12 MED ORDER — ATORVASTATIN CALCIUM 40 MG PO TABS: 40.0000 mg | ORAL_TABLET | Freq: Every day | ORAL | 1 refills | Status: DC

## 2016-12-12 MED ORDER — SODIUM CHLORIDE 0.9% FLUSH
3.0000 mL | INTRAVENOUS | Status: DC | PRN
  Administered 2016-12-12: 3 mL via INTRAVENOUS
  Filled 2016-12-12: qty 3

## 2016-12-12 MED ORDER — CIPROFLOXACIN HCL 500 MG PO TABS: 500.0000 mg | ORAL_TABLET | Freq: Two times a day (BID) | ORAL | 0 refills | Status: DC

## 2016-12-12 NOTE — Discharge Summary (Addendum)
[facility] [place] NAME: Karen [name]    MR#:  [id number]  DATE OF BIRTH:  [birth year]  DATE OF ADMISSION:  12/10/2016  ADMITTING PHYSICIAN: Demetrios Loll, MD  DATE OF DISCHARGE: 12/12/2016  PRIMARY CARE PHYSICIAN: Marden Noble, MD   ADMISSION DIAGNOSIS:  Fatty liver [K76.0] RUQ pain [R10.11] Right lateral abdominal pain [R10.9] Acute renal failure, unspecified acute renal failure type ([place]) [N17.9] Diabetic ketoacidosis without coma associated with diabetes mellitus due to underlying condition (HCC) [E08.10] Acute gastritis, presence of bleeding unspecified, unspecified gastritis type [K29.00] DISCHARGE DIAGNOSIS:  Active Problems:   DKA (diabetic ketoacidoses) ([place])  SECONDARY DIAGNOSIS:   Past Medical History:  Diagnosis Date  . Alcoholic pancreatitis   . Asthma   . Depression   . Diabetes mellitus without complication ([place])   . DKA (diabetic ketoacidoses) ([place]) 02/17/2015  . Emphysematous cystitis   . Hypercholesteremia   . Hypertension   . Hypokalemia    HOSPITAL COURSE:  DKA with diabetes 2. improved with insulin drip, IV fluids support. On Lantus and sliding scale.  Resume home insulin after discharge.  Acute pyelonephritis. Continue cipro iv, f/u follow-up urine culture (ENTEROBACTER AEROGENES) and blood culture ( negative so far).  Changed to Cipro p.o. twice daily.  Acute renal failure with dehydration due to above. Improved with IV fluid support.  Elevatedbilirubin. possible due to nausea vomiting.  Partial small-bowel obstruction The patient tolerated diabetes diet.  Hypokalemia.  Improved with potassium supplement.  Fatty liver per abdominal ultrasound.  Hyperlipidemia.  LDL 246.  Continue Lipitor.  History of alcohol use and alcoholic pancreatitis. DISCHARGE CONDITIONS:  Stable, discharge to home today. CONSULTS OBTAINED:   DRUG ALLERGIES:   Allergies  Allergen Reactions  . Penicillins  Anaphylaxis    Has patient had a PCN reaction causing immediate rash, facial/tongue/throat swelling, SOB or lightheadedness with hypotension: Yes Has patient had a PCN reaction causing severe rash involving mucus membranes or skin necrosis: No Has patient had a PCN reaction that required hospitalization No Has patient had a PCN reaction occurring within the last 10 years: No If all of the above answers are "NO", then may proceed with Cephalosporin use.  . Reglan [Metoclopramide] Other (See Comments)    Hypotension, shortness of breath  . Percocet [Oxycodone-Acetaminophen] Rash   DISCHARGE MEDICATIONS:   Allergies as of 12/12/2016      Reactions   Penicillins Anaphylaxis   Has patient had a PCN reaction causing immediate rash, facial/tongue/throat swelling, SOB or lightheadedness with hypotension: Yes Has patient had a PCN reaction causing severe rash involving mucus membranes or skin necrosis: No Has patient had a PCN reaction that required hospitalization No Has patient had a PCN reaction occurring within the last 10 years: No If all of the above answers are "NO", then may proceed with Cephalosporin use.   Reglan [metoclopramide] Other (See Comments)   Hypotension, shortness of breath   Percocet [oxycodone-acetaminophen] Rash      Medication List    TAKE these medications   amLODipine 5 MG tablet Commonly known as:  NORVASC Take 5 mg by mouth daily.   atorvastatin 40 MG tablet Commonly known as:  LIPITOR Take 1 tablet (40 mg total) by mouth daily at 6 PM. What changed:    medication strength  how much to take  when to take this   ciprofloxacin 500 MG tablet Commonly known as:  CIPRO Take 1 tablet (500 mg total) by mouth 2 (two) times  daily.   cyclobenzaprine 5 MG tablet Commonly known as:  FLEXERIL Take 5 mg by mouth 2 (two) times daily.   dicyclomine 20 MG tablet Commonly known as:  BENTYL Take 1 tablet (20 mg total) by mouth 3 (three) times daily as needed for  spasms.   diphenhydrAMINE 25 MG tablet Commonly known as:  BENADRYL Take 25 mg by mouth every 6 (six) hours as needed.   flurazepam 15 MG capsule Commonly known as:  DALMANE Take 15 mg by mouth at bedtime as needed for sleep.   HYDROcodone-acetaminophen 5-325 MG tablet Commonly known as:  NORCO Take 1 tablet by mouth every 6 (six) hours as needed for severe pain.   ibuprofen 800 MG tablet Commonly known as:  ADVIL,MOTRIN Take 800 mg by mouth every 8 (eight) hours as needed for mild pain or moderate pain.   NOVOLOG MIX 70/30 FLEXPEN (70-30) 100 UNIT/ML FlexPen Generic drug:  insulin aspart protamine - aspart Inject 15 Units into the skin 2 (two) times daily.   ondansetron 4 MG disintegrating tablet Commonly known as:  ZOFRAN ODT Take 1 tablet (4 mg total) by mouth every 8 (eight) hours as needed for nausea or vomiting.        DISCHARGE INSTRUCTIONS:  See AVS.   If you experience worsening of your admission symptoms, develop shortness of breath, life threatening emergency, suicidal or homicidal thoughts you must seek medical attention immediately by calling 911 or calling your MD immediately  if symptoms less severe.  You Must read complete instructions/literature along with all the possible adverse reactions/side effects for all the Medicines you take and that have been prescribed to you. Take any new Medicines after you have completely understood and accpet all the possible adverse reactions/side effects.   Please note  You were cared for by a hospitalist during your hospital stay. If you have any questions about your discharge medications or the care you received while you were in the hospital after you are discharged, you can call the unit and asked to speak with the hospitalist on call if the hospitalist that took care of you is not available. Once you are discharged, your primary care physician will handle any further medical issues. Please note that NO REFILLS for any  discharge medications will be authorized once you are discharged, as it is imperative that you return to your primary care physician (or establish a relationship with a primary care physician if you do not have one) for your aftercare needs so that they can reassess your need for medications and monitor your lab values.    On the day of Discharge:  VITAL SIGNS:  Blood pressure 138/73, pulse 83, temperature 98.4 F (36.9 C), temperature source Oral, resp. rate 20, height 5\' 1"  (1.549 m), weight 119 lb 7 oz (54.2 kg), SpO2 95 %. PHYSICAL EXAMINATION:  GENERAL:  61 y.o.-year-old patient lying in the bed with no acute distress.  EYES: Pupils equal, round, reactive to light and accommodation. No scleral icterus. Extraocular muscles intact.  HEENT: Head atraumatic, normocephalic. Oropharynx and nasopharynx clear.  NECK:  Supple, no jugular venous distention. No thyroid enlargement, no tenderness.  LUNGS: Normal breath sounds bilaterally, no wheezing, rales,rhonchi or crepitation. No use of accessory muscles of respiration.  CARDIOVASCULAR: S1, S2 normal. No murmurs, rubs, or gallops.  ABDOMEN: Soft, non-tender, non-distended. Bowel sounds present. No organomegaly or mass.  EXTREMITIES: No pedal edema, cyanosis, or clubbing.  NEUROLOGIC: Cranial nerves II through XII are intact. Muscle strength 5/5 in  all extremities. Sensation intact. Gait not checked.  PSYCHIATRIC: The patient is alert and oriented x 3.  SKIN: No obvious rash, lesion, or ulcer.  DATA REVIEW:   CBC Recent Labs  Lab 12/10/16 1251  WBC 8.8  HGB 12.4  HCT 36.3  PLT 207    Chemistries  Recent Labs  Lab 12/10/16 0725  12/11/16 0030 12/11/16 1100 12/11/16 1744  NA 131*   < > 135  --   --   K 4.5   < > 3.3* 4.7  --   CL 92*   < > 104  --   --   CO2 11*   < > 22  --   --   GLUCOSE 422*   < > 157*  --   --   BUN 35*   < > 28*  --   --   CREATININE 1.75*   < > 1.16*  --   --   CALCIUM 9.0   < > 7.6*  --   --   MG  --    --   --   --  2.3  AST 30  --   --   --   --   ALT 39  --   --   --   --   ALKPHOS 246*  --   --   --   --   BILITOT 1.6*  --   --   --   --    < > = values in this interval not displayed.     Microbiology Results  Results for orders placed or performed during the hospital encounter of 12/10/16  Urine culture     Status: Abnormal   Collection Time: 12/10/16  7:45 AM  Result Value Ref Range Status   Specimen Description URINE, RANDOM  Final   Special Requests NONE  Final   Culture >=100,000 COLONIES/mL ENTEROBACTER AEROGENES (A)  Final   Report Status 12/12/2016 FINAL  Final   Organism ID, Bacteria ENTEROBACTER AEROGENES (A)  Final      Susceptibility   Enterobacter aerogenes - MIC*    CEFAZOLIN >=64 RESISTANT Resistant     CEFTRIAXONE <=1 SENSITIVE Sensitive     CIPROFLOXACIN <=0.25 SENSITIVE Sensitive     GENTAMICIN <=1 SENSITIVE Sensitive     IMIPENEM 2 SENSITIVE Sensitive     NITROFURANTOIN 64 INTERMEDIATE Intermediate     TRIMETH/SULFA <=20 SENSITIVE Sensitive     PIP/TAZO <=4 SENSITIVE Sensitive     * >=100,000 COLONIES/mL ENTEROBACTER AEROGENES  Culture, blood (routine x 2)     Status: None (Preliminary result)   Collection Time: 12/10/16 11:25 AM  Result Value Ref Range Status   Specimen Description BLOOD Blood Culture adequate volume  Final   Special Requests LEFT ANTECUBITAL  Final   Culture NO GROWTH 2 DAYS  Final   Report Status PENDING  Incomplete  Culture, blood (routine x 2)     Status: None (Preliminary result)   Collection Time: 12/10/16 11:25 AM  Result Value Ref Range Status   Specimen Description BLOOD Blood Culture adequate volume  Final   Special Requests BLOOD RIGHT WRIST  Final   Culture NO GROWTH 2 DAYS  Final   Report Status PENDING  Incomplete  MRSA PCR Screening     Status: None   Collection Time: 12/10/16 12:03 PM  Result Value Ref Range Status   MRSA by PCR NEGATIVE NEGATIVE Final    Comment:  The GeneXpert MRSA Assay (FDA approved  for NASAL specimens only), is one component of a comprehensive MRSA colonization surveillance program. It is not intended to diagnose MRSA infection nor to guide or monitor treatment for MRSA infections.     RADIOLOGY:  No results found.   Management plans discussed with the patient, family and they are in agreement.  CODE STATUS: Full Code   TOTAL TIME TAKING CARE OF THIS PATIENT: 32 minutes.    Demetrios Loll M.D on 12/12/2016 at 11:36 AM  Between 7am to 6pm - Pager - [phone]  After 6pm go to [email or web address] - Proofreader  [facility]  Office  [phone]  CC: Primary care physician; Marden Noble, MD   Note: This dictation was prepared with Dragon dictation along with smaller phrase technology. Any transcriptional errors that result from this process are unintentional.

## 2016-12-12 NOTE — Discharge Instructions (Signed)
° °  Abdominal Pain, Adult Many things can cause belly (abdominal) pain. Most times, belly pain is not dangerous. Many cases of belly pain can be watched and treated at home. Sometimes belly pain is serious, though. Your doctor will try to find the cause of your belly pain. Follow these instructions at home:  Take over-the-counter and prescription medicines only as told by your doctor. Do not take medicines that help you poop (laxatives) unless told to by your doctor.  Drink enough fluid to keep your pee (urine) clear or pale yellow.  Watch your belly pain for any changes.  Keep all follow-up visits as told by your doctor. This is important. Contact a doctor if:  Your belly pain changes or gets worse.  You are not hungry, or you lose weight without trying.  You are having trouble pooping (constipated) or have watery poop (diarrhea) for more than 2-3 days.  You have pain when you pee or poop.  Your belly pain wakes you up at night.  Your pain gets worse with meals, after eating, or with certain foods.  You are throwing up and cannot keep anything down.  You have a fever. Get help right away if:  Your pain does not go away as soon as your doctor says it should.  You cannot stop throwing up.  Your pain is only in areas of your belly, such as the right side or the left lower part of the belly.  You have bloody or black poop, or poop that looks like tar.  You have very bad pain, cramping, or bloating in your belly.  You have signs of not having enough fluid or water in your body (dehydration), such as: ? Dark pee, very little pee, or no pee. ? Cracked lips. ? Dry mouth. ? Sunken eyes. ? Sleepiness. ? Weakness. This information is not intended to replace advice given to you by your health care provider. Make sure you discuss any questions you have with your health care provider. Document Released: 06/10/2007 Document Revised: 07/12/2015 Document Reviewed: 06/05/2015 Elsevier  Interactive Patient Education  2017 [employer] healthy and ADA diet.

## 2016-12-12 NOTE — Progress Notes (Signed)
Urine culture sensitive to cipro with IV cipro given this morning. Norco decreased HA. VSS. Agreeable to discharge home. Oral and written AVS discharge instructions with 2 prescriptions given to pt. Stressed importance of getting antibioitics and complete the cipro course for adequate treatment of UTI. Tolerated diet well. Slept at intervals. Up ad lib in room. Discharge home to self care. Transported pt in transport chair to private vehicle.

## 2016-12-15 LAB — CULTURE, BLOOD (ROUTINE X 2)
CULTURE: NO GROWTH
Culture: NO GROWTH
SPECIMEN DESCRIPTION: ADEQUATE
SPECIMEN DESCRIPTION: ADEQUATE

## 2017-01-03 ENCOUNTER — Other Ambulatory Visit: Payer: Self-pay

## 2017-01-03 ENCOUNTER — Emergency Department: Payer: Medicaid Other

## 2017-01-03 ENCOUNTER — Encounter: Payer: Self-pay | Admitting: Emergency Medicine

## 2017-01-03 ENCOUNTER — Inpatient Hospital Stay
Admission: EM | Admit: 2017-01-03 | Discharge: 2017-01-07 | DRG: 439 | Disposition: A | Discharge status: Home / Self Care | Payer: Medicaid Other | Attending: Internal Medicine | Admitting: Internal Medicine

## 2017-01-03 DIAGNOSIS — Z794 Long term (current) use of insulin: Secondary | ICD-10-CM | POA: Diagnosis not present

## 2017-01-03 DIAGNOSIS — I10 Essential (primary) hypertension: Secondary | ICD-10-CM | POA: Diagnosis present

## 2017-01-03 DIAGNOSIS — R51 Headache: Secondary | ICD-10-CM | POA: Diagnosis present

## 2017-01-03 DIAGNOSIS — N390 Urinary tract infection, site not specified: Secondary | ICD-10-CM | POA: Diagnosis present

## 2017-01-03 DIAGNOSIS — Z880 Allergy status to penicillin: Secondary | ICD-10-CM | POA: Diagnosis not present

## 2017-01-03 DIAGNOSIS — Z90710 Acquired absence of both cervix and uterus: Secondary | ICD-10-CM

## 2017-01-03 DIAGNOSIS — E785 Hyperlipidemia, unspecified: Secondary | ICD-10-CM | POA: Diagnosis present

## 2017-01-03 DIAGNOSIS — F329 Major depressive disorder, single episode, unspecified: Secondary | ICD-10-CM | POA: Diagnosis present

## 2017-01-03 DIAGNOSIS — Z885 Allergy status to narcotic agent status: Secondary | ICD-10-CM | POA: Diagnosis not present

## 2017-01-03 DIAGNOSIS — R109 Unspecified abdominal pain: Secondary | ICD-10-CM

## 2017-01-03 DIAGNOSIS — F1010 Alcohol abuse, uncomplicated: Secondary | ICD-10-CM

## 2017-01-03 DIAGNOSIS — E7800 Pure hypercholesterolemia, unspecified: Secondary | ICD-10-CM | POA: Diagnosis present

## 2017-01-03 DIAGNOSIS — Z888 Allergy status to other drugs, medicaments and biological substances status: Secondary | ICD-10-CM

## 2017-01-03 DIAGNOSIS — E8770 Fluid overload, unspecified: Secondary | ICD-10-CM | POA: Diagnosis not present

## 2017-01-03 DIAGNOSIS — M7989 Other specified soft tissue disorders: Secondary | ICD-10-CM | POA: Diagnosis not present

## 2017-01-03 DIAGNOSIS — E781 Pure hyperglyceridemia: Secondary | ICD-10-CM | POA: Diagnosis present

## 2017-01-03 DIAGNOSIS — K859 Acute pancreatitis without necrosis or infection, unspecified: Secondary | ICD-10-CM | POA: Diagnosis present

## 2017-01-03 DIAGNOSIS — Z87891 Personal history of nicotine dependence: Secondary | ICD-10-CM | POA: Diagnosis not present

## 2017-01-03 DIAGNOSIS — Z79899 Other long term (current) drug therapy: Secondary | ICD-10-CM | POA: Diagnosis not present

## 2017-01-03 DIAGNOSIS — E1165 Type 2 diabetes mellitus with hyperglycemia: Secondary | ICD-10-CM | POA: Diagnosis present

## 2017-01-03 DIAGNOSIS — R519 Headache, unspecified: Secondary | ICD-10-CM

## 2017-01-03 DIAGNOSIS — R0602 Shortness of breath: Secondary | ICD-10-CM

## 2017-01-03 HISTORY — DX: Alcohol abuse, uncomplicated: F10.10

## 2017-01-03 HISTORY — DX: Acute pancreatitis without necrosis or infection, unspecified: K85.90

## 2017-01-03 LAB — URINALYSIS, COMPLETE (UACMP) WITH MICROSCOPIC
BACTERIA UA: NONE SEEN
BILIRUBIN URINE: NEGATIVE
Glucose, UA: >=500 mg/dL — AB
Ketones, ur: NEGATIVE mg/dL
NITRITE: NEGATIVE
PH: 6 (ref 5.0–8.0)
Protein, ur: 100 mg/dL — AB
SPECIFIC GRAVITY, URINE: 1.003 — AB (ref 1.005–1.030)

## 2017-01-03 LAB — COMPREHENSIVE METABOLIC PANEL
ALT: 33 U/L (ref 14–54)
AST: 31 U/L (ref 15–41)
Albumin: 3.8 g/dL (ref 3.5–5.0)
Alkaline Phosphatase: 268 U/L — ABNORMAL HIGH (ref 38–126)
Anion gap: 12 (ref 5–15)
BUN: 22 mg/dL — AB (ref 6–20)
CHLORIDE: 98 mmol/L — AB (ref 101–111)
CO2: 21 mmol/L — AB (ref 22–32)
CREATININE: 1.05 mg/dL — AB (ref 0.44–1.00)
Calcium: 8.9 mg/dL (ref 8.9–10.3)
GFR calc Af Amer: >60 mL/min (ref >60)
GFR calc non Af Amer: 57 mL/min — ABNORMAL LOW (ref >60)
GLUCOSE: 375 mg/dL — AB (ref 65–99)
Potassium: 4 mmol/L (ref 3.5–5.1)
SODIUM: 131 mmol/L — AB (ref 135–145)
Total Bilirubin: 0.7 mg/dL (ref 0.3–1.2)
Total Protein: 8.3 g/dL — ABNORMAL HIGH (ref 6.5–8.1)

## 2017-01-03 LAB — GLUCOSE, CAPILLARY
GLUCOSE-CAPILLARY: 200 mg/dL — AB (ref 65–99)
Glucose-Capillary: 214 mg/dL — ABNORMAL HIGH (ref 65–99)

## 2017-01-03 LAB — CBC WITH DIFFERENTIAL/PLATELET
BASOS ABS: 0 10*3/uL (ref 0–0.1)
Basophils Relative: 0 %
EOS PCT: 1 %
Eosinophils Absolute: 0.1 10*3/uL (ref 0–0.7)
HEMATOCRIT: 36.5 % (ref 35.0–47.0)
Hemoglobin: 12.5 g/dL (ref 12.0–16.0)
LYMPHS ABS: 2.4 10*3/uL (ref 1.0–3.6)
LYMPHS PCT: 30 %
MCH: 31.2 pg (ref 26.0–34.0)
MCHC: 34.2 g/dL (ref 32.0–36.0)
MCV: 91.4 fL (ref 80.0–100.0)
MONO ABS: 1 10*3/uL — AB (ref 0.2–0.9)
MONOS PCT: 12 %
NEUTROS ABS: 4.5 10*3/uL (ref 1.4–6.5)
Neutrophils Relative %: 57 %
PLATELETS: 177 10*3/uL (ref 150–440)
RBC: 4 MIL/uL (ref 3.80–5.20)
RDW: 12.5 % (ref 11.5–14.5)
WBC: 8 10*3/uL (ref 3.6–11.0)

## 2017-01-03 LAB — LIPID PANEL
Cholesterol: 392 mg/dL — ABNORMAL HIGH (ref 0–200)
HDL: 89 mg/dL (ref >40)
LDL CALC: UNDETERMINED mg/dL (ref 0–99)
TRIGLYCERIDES: 452 mg/dL — AB (ref <150)
Total CHOL/HDL Ratio: 4.4 RATIO
VLDL: UNDETERMINED mg/dL (ref 0–40)

## 2017-01-03 LAB — TROPONIN I

## 2017-01-03 LAB — POCT PREGNANCY, URINE: Preg Test, Ur: NEGATIVE

## 2017-01-03 LAB — LIPASE, BLOOD: LIPASE: 350 U/L — AB (ref 11–51)

## 2017-01-03 MED ORDER — IOPAMIDOL (ISOVUE-300) INJECTION 61%
30.0000 mL | Freq: Once | INTRAVENOUS | Status: AC | PRN
  Administered 2017-01-03: 30 mL via ORAL

## 2017-01-03 MED ORDER — MORPHINE SULFATE (PF) 4 MG/ML IV SOLN
4.0000 mg | Freq: Once | INTRAVENOUS | Status: AC
  Administered 2017-01-03: 4 mg via INTRAVENOUS
  Filled 2017-01-03: qty 1

## 2017-01-03 MED ORDER — SODIUM CHLORIDE 0.9 % IV BOLUS (SEPSIS)
1000.0000 mL | Freq: Once | INTRAVENOUS | Status: AC
  Administered 2017-01-03: 1000 mL via INTRAVENOUS

## 2017-01-03 MED ORDER — ONDANSETRON HCL 4 MG/2ML IJ SOLN
4.0000 mg | Freq: Once | INTRAMUSCULAR | Status: DC | PRN
  Filled 2017-01-03: qty 2

## 2017-01-03 MED ORDER — PROMETHAZINE HCL 25 MG/ML IJ SOLN
12.5000 mg | Freq: Four times a day (QID) | INTRAMUSCULAR | Status: DC | PRN
  Administered 2017-01-03: 25 mg via INTRAVENOUS
  Filled 2017-01-03: qty 1

## 2017-01-03 MED ORDER — INSULIN ASPART 100 UNIT/ML ~~LOC~~ SOLN
0.0000 [IU] | Freq: Three times a day (TID) | SUBCUTANEOUS | Status: DC
  Administered 2017-01-03: 3 [IU] via SUBCUTANEOUS
  Filled 2017-01-03: qty 1

## 2017-01-03 MED ORDER — HYDROCODONE-ACETAMINOPHEN 5-325 MG PO TABS
1.0000 | ORAL_TABLET | Freq: Four times a day (QID) | ORAL | Status: DC | PRN
  Administered 2017-01-04 – 2017-01-07 (×9): 1 via ORAL
  Filled 2017-01-03 (×10): qty 1

## 2017-01-03 MED ORDER — AMLODIPINE BESYLATE 5 MG PO TABS
5.0000 mg | ORAL_TABLET | Freq: Every day | ORAL | Status: DC
  Administered 2017-01-04 – 2017-01-06 (×3): 5 mg via ORAL
  Filled 2017-01-03 (×3): qty 1

## 2017-01-03 MED ORDER — CYCLOBENZAPRINE HCL 10 MG PO TABS
5.0000 mg | ORAL_TABLET | Freq: Two times a day (BID) | ORAL | Status: DC
  Administered 2017-01-03 – 2017-01-07 (×8): 5 mg via ORAL
  Filled 2017-01-03 (×8): qty 1

## 2017-01-03 MED ORDER — ONDANSETRON HCL 4 MG PO TABS
4.0000 mg | ORAL_TABLET | Freq: Four times a day (QID) | ORAL | Status: DC | PRN
  Administered 2017-01-05 – 2017-01-07 (×3): 4 mg via ORAL
  Filled 2017-01-03 (×3): qty 1

## 2017-01-03 MED ORDER — ONDANSETRON HCL 4 MG/2ML IJ SOLN
4.0000 mg | Freq: Once | INTRAMUSCULAR | Status: AC
  Administered 2017-01-03: 4 mg via INTRAVENOUS

## 2017-01-03 MED ORDER — MORPHINE SULFATE (PF) 4 MG/ML IV SOLN
4.0000 mg | Freq: Once | INTRAVENOUS | Status: AC
  Administered 2017-01-03: 4 mg via INTRAVENOUS

## 2017-01-03 MED ORDER — DOCUSATE SODIUM 100 MG PO CAPS
100.0000 mg | ORAL_CAPSULE | Freq: Two times a day (BID) | ORAL | Status: DC
  Administered 2017-01-03 – 2017-01-06 (×6): 100 mg via ORAL
  Filled 2017-01-03 (×6): qty 1

## 2017-01-03 MED ORDER — PANTOPRAZOLE SODIUM 40 MG IV SOLR
40.0000 mg | Freq: Two times a day (BID) | INTRAVENOUS | Status: DC
  Administered 2017-01-03 – 2017-01-07 (×9): 40 mg via INTRAVENOUS
  Filled 2017-01-03 (×8): qty 40

## 2017-01-03 MED ORDER — ONDANSETRON HCL 4 MG/2ML IJ SOLN
INTRAMUSCULAR | Status: AC
Start: 2017-01-03 — End: 2017-01-03
  Administered 2017-01-03: 4 mg via INTRAVENOUS
  Filled 2017-01-03: qty 2

## 2017-01-03 MED ORDER — DICYCLOMINE HCL 20 MG PO TABS
20.0000 mg | ORAL_TABLET | Freq: Three times a day (TID) | ORAL | Status: DC | PRN
  Filled 2017-01-03: qty 1

## 2017-01-03 MED ORDER — TEMAZEPAM 15 MG PO CAPS
15.0000 mg | ORAL_CAPSULE | Freq: Every day | ORAL | Status: DC
  Administered 2017-01-03 – 2017-01-06 (×4): 15 mg via ORAL
  Filled 2017-01-03 (×4): qty 1

## 2017-01-03 MED ORDER — MORPHINE SULFATE (PF) 2 MG/ML IV SOLN
2.0000 mg | INTRAVENOUS | Status: DC | PRN
  Administered 2017-01-03 – 2017-01-05 (×4): 2 mg via INTRAVENOUS
  Filled 2017-01-03 (×4): qty 1

## 2017-01-03 MED ORDER — ATORVASTATIN CALCIUM 20 MG PO TABS
40.0000 mg | ORAL_TABLET | Freq: Every day | ORAL | Status: DC
  Administered 2017-01-04 – 2017-01-06 (×3): 40 mg via ORAL
  Filled 2017-01-03 (×3): qty 2

## 2017-01-03 MED ORDER — ACETAMINOPHEN 325 MG PO TABS
650.0000 mg | ORAL_TABLET | Freq: Four times a day (QID) | ORAL | Status: DC | PRN
  Administered 2017-01-04: 650 mg via ORAL
  Filled 2017-01-03: qty 2

## 2017-01-03 MED ORDER — ONDANSETRON HCL 4 MG/2ML IJ SOLN
4.0000 mg | Freq: Once | INTRAMUSCULAR | Status: AC
  Administered 2017-01-03: 4 mg via INTRAVENOUS
  Filled 2017-01-03: qty 2

## 2017-01-03 MED ORDER — LEVOFLOXACIN IN D5W 750 MG/150ML IV SOLN
750.0000 mg | INTRAVENOUS | Status: DC
  Administered 2017-01-03: 750 mg via INTRAVENOUS
  Filled 2017-01-03: qty 150

## 2017-01-03 MED ORDER — INSULIN ASPART PROT & ASPART (70-30 MIX) 100 UNIT/ML ~~LOC~~ SUSP: 15.0000 [IU] | Freq: Two times a day (BID) | SUBCUTANEOUS | Status: DC

## 2017-01-03 MED ORDER — ACETAMINOPHEN 650 MG RE SUPP: 650.0000 mg | Freq: Four times a day (QID) | RECTAL | Status: DC | PRN

## 2017-01-03 MED ORDER — HEPARIN SODIUM (PORCINE) 5000 UNIT/ML IJ SOLN
5000.0000 [IU] | Freq: Three times a day (TID) | INTRAMUSCULAR | Status: DC
  Administered 2017-01-04 – 2017-01-07 (×9): 5000 [IU] via SUBCUTANEOUS
  Filled 2017-01-03 (×9): qty 1

## 2017-01-03 MED ORDER — MORPHINE SULFATE (PF) 4 MG/ML IV SOLN
INTRAVENOUS | Status: AC
  Administered 2017-01-03: 4 mg via INTRAVENOUS
  Filled 2017-01-03: qty 1

## 2017-01-03 MED ORDER — DIPHENHYDRAMINE HCL 25 MG PO CAPS
25.0000 mg | ORAL_CAPSULE | Freq: Four times a day (QID) | ORAL | Status: DC | PRN
  Administered 2017-01-04 – 2017-01-07 (×2): 25 mg via ORAL
  Filled 2017-01-03 (×2): qty 1

## 2017-01-03 MED ORDER — BISACODYL 10 MG RE SUPP
10.0000 mg | Freq: Every day | RECTAL | Status: DC | PRN
Start: 2017-01-03 — End: 2017-01-07

## 2017-01-03 MED ORDER — LORAZEPAM 2 MG/ML IJ SOLN: 1.0000 mg | INTRAMUSCULAR | Status: DC | PRN

## 2017-01-03 MED ORDER — ONDANSETRON HCL 4 MG/2ML IJ SOLN
4.0000 mg | Freq: Four times a day (QID) | INTRAMUSCULAR | Status: DC | PRN
  Administered 2017-01-03 – 2017-01-04 (×2): 4 mg via INTRAVENOUS
  Filled 2017-01-03: qty 2

## 2017-01-03 MED ORDER — IOPAMIDOL (ISOVUE-300) INJECTION 61%
75.0000 mL | Freq: Once | INTRAVENOUS | Status: AC | PRN
  Administered 2017-01-03: 75 mL via INTRAVENOUS

## 2017-01-03 MED ORDER — SODIUM CHLORIDE 0.9 % IV SOLN
INTRAVENOUS | Status: DC
  Administered 2017-01-03 – 2017-01-05 (×5): via INTRAVENOUS

## 2017-01-03 NOTE — Progress Notes (Signed)
ANTIBIOTIC CONSULT NOTE - INITIAL  Pharmacy Consult for levofloxacin Indication: IAI  Allergies  Allergen Reactions  . Penicillins Anaphylaxis    Has patient had a PCN reaction causing immediate rash, facial/tongue/throat swelling, SOB or lightheadedness with hypotension: Yes Has patient had a PCN reaction causing severe rash involving mucus membranes or skin necrosis: No Has patient had a PCN reaction that required hospitalization No Has patient had a PCN reaction occurring within the last 10 years: No If all of the above answers are "NO", then may proceed with Cephalosporin use.  . Reglan [Metoclopramide] Other (See Comments)    Hypotension, shortness of breath  . Percocet [Oxycodone-Acetaminophen] Rash    Patient Measurements: Height: 5\' 1"  (154.9 cm) Weight: 110 lb (49.9 kg) IBW/kg (Calculated) : 47.8 Adjusted Body Weight:   Vital Signs: Temp: 99.3 F (37.4 C) (12/30 1322) Temp Source: Oral (12/30 1322) BP: 152/82 (12/30 1330) Pulse Rate: 81 (12/30 1430) Intake/Output from previous day: No intake/output data recorded. Intake/Output from this shift: No intake/output data recorded.  Labs: Recent Labs    01/03/17 1307  WBC 8.0  HGB 12.5  PLT 177  CREATININE 1.05*   Estimated Creatinine Clearance: 43.5 mL/min (A) (by C-G formula based on SCr of 1.05 mg/dL (H)). No results for input(s): VANCOTROUGH, VANCOPEAK, VANCORANDOM, GENTTROUGH, GENTPEAK, GENTRANDOM, TOBRATROUGH, TOBRAPEAK, TOBRARND, AMIKACINPEAK, AMIKACINTROU, AMIKACIN in the last 72 hours.   Microbiology: Recent Results (from the past 720 hour(s))  Urine culture     Status: Abnormal   Collection Time: 12/10/16  7:45 AM  Result Value Ref Range Status   Specimen Description URINE, RANDOM  Final   Special Requests NONE  Final   Culture >=100,000 COLONIES/mL ENTEROBACTER AEROGENES (A)  Final   Report Status 12/12/2016 FINAL  Final   Organism ID, Bacteria ENTEROBACTER AEROGENES (A)  Final      Susceptibility    Enterobacter aerogenes - MIC*    CEFAZOLIN >=64 RESISTANT Resistant     CEFTRIAXONE <=1 SENSITIVE Sensitive     CIPROFLOXACIN <=0.25 SENSITIVE Sensitive     GENTAMICIN <=1 SENSITIVE Sensitive     IMIPENEM 2 SENSITIVE Sensitive     NITROFURANTOIN 64 INTERMEDIATE Intermediate     TRIMETH/SULFA <=20 SENSITIVE Sensitive     PIP/TAZO <=4 SENSITIVE Sensitive     * >=100,000 COLONIES/mL ENTEROBACTER AEROGENES  Culture, blood (routine x 2)     Status: None   Collection Time: 12/10/16 11:25 AM  Result Value Ref Range Status   Specimen Description BLOOD Blood Culture adequate volume  Final   Special Requests LEFT ANTECUBITAL  Final   Culture NO GROWTH 5 DAYS  Final   Report Status 12/15/2016 FINAL  Final  Culture, blood (routine x 2)     Status: None   Collection Time: 12/10/16 11:25 AM  Result Value Ref Range Status   Specimen Description BLOOD Blood Culture adequate volume  Final   Special Requests BLOOD RIGHT WRIST  Final   Culture NO GROWTH 5 DAYS  Final   Report Status 12/15/2016 FINAL  Final  MRSA PCR Screening     Status: None   Collection Time: 12/10/16 12:03 PM  Result Value Ref Range Status   MRSA by PCR NEGATIVE NEGATIVE Final    Comment:        The GeneXpert MRSA Assay (FDA approved for NASAL specimens only), is one component of a comprehensive MRSA colonization surveillance program. It is not intended to diagnose MRSA infection nor to guide or monitor treatment for MRSA infections.  Medical History: Past Medical History:  Diagnosis Date  . Alcoholic pancreatitis   . Asthma   . Depression   . Diabetes mellitus without complication ([place])   . DKA (diabetic ketoacidoses) ([place]) 02/17/2015  . Emphysematous cystitis   . Hypercholesteremia   . Hypertension   . Hypokalemia     Medications:  Infusions:  . sodium chloride    . sodium chloride     Assessment: 61 yof cc abdominal pain PMH HTN, DM, ETOH use. CT unremarkable but lipase elevated. Also has UTI.    Goal of Therapy:  Resolve infection Prevent ADE  Plan:  Levofloxacin 750 mg IV Q48H for IAI and CrCl 20 to 49 mL/min. Pharmacy will continue to follow.  Laural Benes, Pharm.D., BCPS Clinical Pharmacist 01/03/2017,3:14 PM

## 2017-01-03 NOTE — ED Triage Notes (Signed)
Pt presents to ED via [facility] from home c/o RUQ abd pain, painful urination, and headache. CBG 443, pt states she took novolog at 0500 today. Hx DM, HTN, pancreatitis.

## 2017-01-03 NOTE — H&P (Signed)
History and Physical    LESLEE SUIRE JAS:[id number] DOB: 12-04-1955 DOA: 01/03/2017  Referring physician: Dr. Cinda Quest PCP: Marden Noble, MD  Specialists: none  Chief Complaint: abdominal pain  HPI: Karen [name] is a 61 y.o. female has a past medical history significant for HTN, DM, and ETOH abuse now with acute onset abdominal pain with N/V and dysuria. In ER, CT unremarkable but Lipase elevated. Also with UTI. Has been abusing ETOH today. No diarrhea. Low grade fever. Denies CP or SOB. Diabetes under poor control. She is now admitted  Review of Systems: The patient denies anorexia, fever, weight loss,, vision loss, decreased hearing, hoarseness, chest pain, syncope, dyspnea on exertion, peripheral edema, balance deficits, hemoptysis,  melena, hematochezia, severe indigestion/heartburn, hematuria, incontinence, genital sores, muscle weakness, suspicious skin lesions, transient blindness, difficulty walking, depression, unusual weight change, abnormal bleeding, enlarged lymph nodes, angioedema, and breast masses.   Past Medical History:  Diagnosis Date  . Alcoholic pancreatitis   . Asthma   . Depression   . Diabetes mellitus without complication ([place])   . DKA (diabetic ketoacidoses) ([place]) 02/17/2015  . Emphysematous cystitis   . Hypercholesteremia   . Hypertension   . Hypokalemia    Past Surgical History:  Procedure Laterality Date  . ABDOMINAL HYSTERECTOMY  1996  . APPENDECTOMY  1997  . ESOPHAGOGASTRODUODENOSCOPY (EGD) WITH PROPOFOL N/A 04/24/2016   Procedure: ESOPHAGOGASTRODUODENOSCOPY (EGD) WITH PROPOFOL;  Surgeon: [name], MD;  Location: [facility] ENDOSCOPY;  Service: Endoscopy;  Laterality: N/A;  . HAND SURGERY  1988  . THYROID SURGERY  2013   Social History:  reports that she has quit smoking. she has never used smokeless tobacco. She reports that she drinks alcohol. She reports that she does not use drugs.  Allergies  Allergen Reactions  . Penicillins Anaphylaxis     Has patient had a PCN reaction causing immediate rash, facial/tongue/throat swelling, SOB or lightheadedness with hypotension: Yes Has patient had a PCN reaction causing severe rash involving mucus membranes or skin necrosis: No Has patient had a PCN reaction that required hospitalization No Has patient had a PCN reaction occurring within the last 10 years: No If all of the above answers are "NO", then may proceed with Cephalosporin use.  . Reglan [Metoclopramide] Other (See Comments)    Hypotension, shortness of breath  . Percocet [Oxycodone-Acetaminophen] Rash    Family History  Family history unknown: Yes    Prior to Admission medications   Medication Sig Start Date End Date Taking? Authorizing Provider  amLODipine (NORVASC) 5 MG tablet Take 5 mg by mouth daily.    [provider]  atorvastatin (LIPITOR) 40 MG tablet Take 1 tablet (40 mg total) by mouth daily at 6 PM. 12/12/16   Demetrios Loll, MD  ciprofloxacin (CIPRO) 500 MG tablet Take 1 tablet (500 mg total) by mouth 2 (two) times daily. 12/12/16   Demetrios Loll, MD  cyclobenzaprine (FLEXERIL) 5 MG tablet Take 5 mg by mouth 2 (two) times daily.    [provider]  dicyclomine (BENTYL) 20 MG tablet Take 1 tablet (20 mg total) by mouth 3 (three) times daily as needed for spasms. 06/11/16   Carrie Mew, MD  diphenhydrAMINE (BENADRYL) 25 MG tablet Take 25 mg by mouth every 6 (six) hours as needed.    [provider]  flurazepam (DALMANE) 15 MG capsule Take 15 mg by mouth at bedtime as needed for sleep.    [provider]  HYDROcodone-acetaminophen (NORCO) 5-325 MG tablet Take 1  tablet by mouth every 6 (six) hours as needed for severe pain. 10/16/16   Henreitta Leber, MD  ibuprofen (ADVIL,MOTRIN) 800 MG tablet Take 800 mg by mouth every 8 (eight) hours as needed for mild pain or moderate pain.    [provider]  insulin aspart protamine - aspart (NOVOLOG MIX 70/30 FLEXPEN) (70-30) 100 UNIT/ML  FlexPen Inject 15 Units into the skin 2 (two) times daily.    [provider]  ondansetron (ZOFRAN ODT) 4 MG disintegrating tablet Take 1 tablet (4 mg total) by mouth every 8 (eight) hours as needed for nausea or vomiting. 10/16/16   Henreitta Leber, MD   Physical Exam: Vitals:   01/03/17 1305 01/03/17 1322 01/03/17 1330 01/03/17 1430  BP:  (!) 150/79 (!) 152/82   Pulse:  81 80 81  Resp:  16 11 18   Temp:  99.3 F (37.4 C)    TempSrc:  Oral    SpO2:  98% 98% 98%  Weight: 49.9 kg (110 lb)     Height: 5\' 1"  (1.549 m)        General:  No apparent distress, WDWN, [place]/AT  Eyes: PERRL, EOMI, no scleral icterus, conjunctiva clear  ENT: moist oropharynx without exudate, TM's benign, dentition fair  Neck: supple, no lymphadenopathy. No bruits or thyromegaly  Cardiovascular: regular rate without MRG; 2+ peripheral pulses, no JVD, no peripheral edema  Respiratory: CTA biL, good air movement without wheezing, rhonchi or crackled. Respiratory effort normal  Abdomen: distended, diffusely tender to palpation, positive bowel sounds, no guarding, no rebound  Skin: no rashes or lesions  Musculoskeletal: normal bulk and tone, no joint swelling  Psychiatric: normal mood and affect, A&OX3  Neurologic: CN 2-12 grossly intact, Motor strength 5/5 in all 4 groups with symmetric DTR's and non-focal sensory exam  Labs on Admission:  Basic Metabolic Panel: Recent Labs  Lab 01/03/17 1307  NA 131*  K 4.0  CL 98*  CO2 21*  GLUCOSE 375*  BUN 22*  CREATININE 1.05*  CALCIUM 8.9   Liver Function Tests: Recent Labs  Lab 01/03/17 1307  AST 31  ALT 33  ALKPHOS 268*  BILITOT 0.7  PROT 8.3*  ALBUMIN 3.8   Recent Labs  Lab 01/03/17 1307  LIPASE 350*   No results for input(s): AMMONIA in the last 168 hours. CBC: Recent Labs  Lab 01/03/17 1307  WBC 8.0  NEUTROABS 4.5  HGB 12.5  HCT 36.5  MCV 91.4  PLT 177   Cardiac Enzymes: Recent Labs  Lab 01/03/17 1307  TROPONINI  <0.03    BNP (last 3 results) No results for input(s): BNP in the last 8760 hours.  ProBNP (last 3 results) No results for input(s): PROBNP in the last 8760 hours.  CBG: No results for input(s): GLUCAP in the last 168 hours.  Radiological Exams on Admission: Ct Head Wo Contrast  Result Date: 01/03/2017 CLINICAL DATA:  Headache. Diabetes. Hypertension. Elevated blood sugar. EXAM: CT HEAD WITHOUT CONTRAST TECHNIQUE: Contiguous axial images were obtained from the base of the skull through the vertex without intravenous contrast. COMPARISON:  04/21/2016 brain MR. 04/20/2016 CT. FINDINGS: Brain: Mild age advanced cerebral atrophy. No mass lesion, hemorrhage, hydrocephalus, acute infarct, intra-axial, or extra-axial fluid collection. Vascular: Intracranial atherosclerosis. Skull: Normal Sinuses/Orbits: Normal imaged portions of the orbits and globes. Clear paranasal sinuses and mastoid air cells. Other: None. IMPRESSION: 1.  No acute intracranial abnormality. 2. Cerebral atrophy. Electronically Signed   By: Abigail Miyamoto M.D.   On: 01/03/2017  14:32   Ct Abdomen Pelvis W Contrast  Result Date: 01/03/2017 CLINICAL DATA:  Right upper quadrant pain and dysuria. EXAM: CT ABDOMEN AND PELVIS WITH CONTRAST TECHNIQUE: Multidetector CT imaging of the abdomen and pelvis was performed using the standard protocol following bolus administration of intravenous contrast. CONTRAST:  24mL ISOVUE-300 IOPAMIDOL (ISOVUE-300) INJECTION 61% COMPARISON:  12/10/2016 FINDINGS: Lower chest:  Unremarkable. Hepatobiliary: No focal abnormality within the liver parenchyma. There is no evidence for gallstones, gallbladder wall thickening, or pericholecystic fluid. 7 mm diameter common bile duct is stable in the interval. Pancreas: No focal mass lesion. No dilatation of the main duct. No intraparenchymal cyst. No peripancreatic edema. Spleen: No splenomegaly. No focal mass lesion. Adrenals/Urinary Tract: No adrenal nodule or mass. 2  mm nonobstructing stone identified upper pole right kidney. Left kidney unremarkable. No evidence for hydroureter. Circumferential bladder wall thickening and trabeculation noted. Stomach/Bowel: Tiny hiatal hernia. Stomach is nondistended. No gastric wall thickening. No evidence of outlet obstruction. Duodenum is normally positioned as is the ligament of Treitz. No small bowel wall thickening. No small bowel dilatation. The terminal ileum is normal. The appendix is not visualized, but there is no edema or inflammation in the region of the cecum. Diverticuli are seen scattered along the entire length of the colon without CT findings of diverticulitis. Vascular/Lymphatic: There is abdominal aortic atherosclerosis without aneurysm. There is no gastrohepatic or hepatoduodenal ligament lymphadenopathy. No intraperitoneal or retroperitoneal lymphadenopathy. No pelvic sidewall lymphadenopathy. Reproductive: Uterus surgically absent.  There is no adnexal mass. Other: No intraperitoneal free fluid. Musculoskeletal: Bone windows reveal no worrisome lytic or sclerotic osseous lesions. IMPRESSION: 1. No acute findings in the abdomen or pelvis. 2. 2 mm nonobstructing stone upper pole right kidney. 3. Circumferential bladder wall thickening bladder infection/inflammation could have this appearance. 4.  Aortic Atherosclerois (ICD10-170.0) Electronically Signed   By: Misty Stanley M.D.   On: 01/03/2017 14:33    EKG: Independently reviewed.  Assessment/Plan Principal Problem:   Pancreatitis, acute Active Problems:   Poorly controlled type 2 diabetes mellitus (HCC)   ETOH abuse   UTI (urinary tract infection)   Will admit to floor with IV fluids. Clear liquid diet. Begin IV Protonix and empiric IV ABX for UTI. Follow sugars. Repeat labs in AM  Diet: clear liquids Fluids: NS@125  DVT Prophylaxis: SQ Heparin  Code Status: FULL  Family Communication: none  Disposition Plan: home  Time spent: 50 min

## 2017-01-03 NOTE — ED Notes (Signed)
Pt reports daily drinking, about 40oz/day but states she has been drinking more over the holidays. Last drink early this morning.

## 2017-01-03 NOTE — ED Provider Notes (Signed)
[facility] Emergency Department Provider Note   ____________________________________________   First MD Initiated Contact with Patient 01/03/17 1307     (approximate)  I have reviewed the triage vital signs and the nursing notes.   HISTORY  Chief Complaint Abdominal Pain    HPI Karen [name] is a 61 y.o. female Who reports onset of right upper quadrant pain and painful urination and bad headache today. She said she took her NovoLog at about 5 this morning. She has a history of diabetes hypertension and pancreatitis. She said she ran out of pain pills because her doctor Dr. Soyla Dryer retired. She means Dr. Brynda Greathouse. She is nauseated as well. patient cannot tell me if the pain is achy or burning she says it hurts a lot in the right side she is holding her right middle abdomen. She also says she can't urinate because it hurts because of the burning was also started today.   Past Medical History:  Diagnosis Date  . Alcoholic pancreatitis   . Asthma   . Depression   . Diabetes mellitus without complication ([place])   . DKA (diabetic ketoacidoses) ([place]) 02/17/2015  . Emphysematous cystitis   . Hypercholesteremia   . Hypertension   . Hypokalemia     Patient Active Problem List   Diagnosis Date Noted  . Pancreatitis, acute 01/03/2017  . ETOH abuse 01/03/2017  . UTI (urinary tract infection) 01/03/2017  . Nausea and vomiting   . Esophageal candidiasis ([place])   . Intractable vomiting with nausea   . Hypertensive urgency 04/21/2016  . Abdominal pain   . Acute renal failure ([place])   . Nausea vomiting and diarrhea   . Smoker   . Poorly controlled type 2 diabetes mellitus ([place])   . Acute renal insufficiency 10/29/2015  . Elevated troponin 10/29/2015  . Urinary tract infection without hematuria 10/29/2015  . Left flank pain 10/29/2015  . Malignant essential hypertension 10/29/2015  . DKA (diabetic ketoacidoses) ([place]) 02/17/2015  . Emphysematous cystitis      Past Surgical History:  Procedure Laterality Date  . ABDOMINAL HYSTERECTOMY  1996  . APPENDECTOMY  1997  . ESOPHAGOGASTRODUODENOSCOPY (EGD) WITH PROPOFOL N/A 04/24/2016   Procedure: ESOPHAGOGASTRODUODENOSCOPY (EGD) WITH PROPOFOL;  Surgeon: [name], MD;  Location: [facility] ENDOSCOPY;  Service: Endoscopy;  Laterality: N/A;  . HAND SURGERY  1988  . THYROID SURGERY  2013    Prior to Admission medications   Medication Sig Start Date End Date Taking? Authorizing Provider  amLODipine (NORVASC) 5 MG tablet Take 5 mg by mouth daily.   Yes [provider]  atorvastatin (LIPITOR) 40 MG tablet Take 1 tablet (40 mg total) by mouth daily at 6 PM. 12/12/16  Yes Demetrios Loll, MD  diphenhydrAMINE (BENADRYL) 25 MG tablet Take 25 mg by mouth every 6 (six) hours as needed.   Yes [provider]  insulin aspart protamine - aspart (NOVOLOG MIX 70/30 FLEXPEN) (70-30) 100 UNIT/ML FlexPen Inject 15 Units into the skin 2 (two) times daily.   Yes [provider]  ciprofloxacin (CIPRO) 500 MG tablet Take 1 tablet (500 mg total) by mouth 2 (two) times daily. Patient not taking: Reported on 01/03/2017 12/12/16   Demetrios Loll, MD  cyclobenzaprine (FLEXERIL) 5 MG tablet Take 5 mg by mouth 2 (two) times daily.    [provider]  dicyclomine (BENTYL) 20 MG tablet Take 1 tablet (20 mg total) by mouth 3 (three) times daily as needed for spasms. Patient not taking: Reported on 01/03/2017  06/11/16   Carrie Mew, MD  flurazepam [facility]) 15 MG capsule Take 15 mg by mouth at bedtime as needed for sleep.    [provider]  HYDROcodone-acetaminophen (NORCO) 5-325 MG tablet Take 1 tablet by mouth every 6 (six) hours as needed for severe pain. Patient not taking: Reported on 01/03/2017 10/16/16   Henreitta Leber, MD  ondansetron (ZOFRAN ODT) 4 MG disintegrating tablet Take 1 tablet (4 mg total) by mouth every 8 (eight) hours as needed for nausea or vomiting. Patient not taking:  Reported on 01/03/2017 10/16/16   Henreitta Leber, MD    Allergies Penicillins; Reglan [metoclopramide]; and Percocet [oxycodone-acetaminophen]  Family History  Family history unknown: Yes    Social History Social History   Tobacco Use  . Smoking status: Former Research scientist (life sciences)  . Smokeless tobacco: Never Used  Substance Use Topics  . Alcohol use: Yes    Comment: 40oz/day  . Drug use: No    Review of Systems  Constitutional: No fever/chills Eyes: No visual changes. ENT: No sore throat. Cardiovascular: Denies chest pain. Respiratory: Denies shortness of breath. Gastrointestinal:see history of present illness Genitourinary: dysuria. Musculoskeletal: Negative for back pain. Skin: Negative for rash. Neurological: Negative for headaches, focal weakness  ____________________________________________   PHYSICAL EXAM:  VITAL SIGNS: ED Triage Vitals [01/03/17 1305]  Enc Vitals Group     BP      Pulse      Resp      Temp      Temp src      SpO2      Weight 110 lb (49.9 kg)     Height 5\' 1"  (1.549 m)     Head Circumference      Peak Flow      Pain Score 10     Pain Loc      Pain Edu?      Excl. in [place]?     Constitutional: Alert and oriented. complaining of pain in her head and her belly Eyes: Conjunctivae are normal.  Head: Atraumatic. Nose: No congestion/rhinnorhea. Mouth/Throat: Mucous membranes are moist.  Oropharynx non-erythematous. Neck: No stridor.   Cardiovascular: Normal rate, regular rhythm. Grossly normal heart sounds.  Good peripheral circulation. Respiratory: Normal respiratory effort.  No retractions. Lungs CTAB. Gastrointestinal: Soft tender on the right side to palpation and percussion some distention. No abdominal bruits. No CVA tenderness.  Musculoskeletal: No lower extremity tenderness nor edema.  No joint effusions. Neurologic:  Normal speech and language. No gross focal neurologic deficits are appreciated. No gait instability. Skin:  Skin is warm,  dry and intact. No rash noted. Psychiatric: Mood and affect are normal. Speech and behavior are normal.  ____________________________________________   LABS (all labs ordered are listed, but only abnormal results are displayed)  Labs Reviewed  LIPASE, BLOOD - Abnormal; Notable for the following components:      Result Value   Lipase 350 (*)    All other components within normal limits  COMPREHENSIVE METABOLIC PANEL - Abnormal; Notable for the following components:   Sodium 131 (*)    Chloride 98 (*)    CO2 21 (*)    Glucose, Bld 375 (*)    BUN 22 (*)    Creatinine, Ser 1.05 (*)    Total Protein 8.3 (*)    Alkaline Phosphatase 268 (*)    GFR calc non Af Amer 57 (*)    All other components within normal limits  CBC WITH DIFFERENTIAL/PLATELET - Abnormal; Notable for the following components:  Monocytes Absolute 1.0 (*)    All other components within normal limits  LIPID PANEL - Abnormal; Notable for the following components:   Cholesterol 392 (*)    Triglycerides 452 (*)    All other components within normal limits  URINALYSIS, COMPLETE (UACMP) WITH MICROSCOPIC - Abnormal; Notable for the following components:   Color, Urine STRAW (*)    APPearance CLEAR (*)    Specific Gravity, Urine 1.003 (*)    Glucose, UA >=500 (*)    Hgb urine dipstick SMALL (*)    Protein, ur 100 (*)    Leukocytes, UA TRACE (*)    Squamous Epithelial / LPF 0-5 (*)    All other components within normal limits  GLUCOSE, CAPILLARY - Abnormal; Notable for the following components:   Glucose-Capillary 214 (*)    All other components within normal limits  GLUCOSE, CAPILLARY - Abnormal; Notable for the following components:   Glucose-Capillary 200 (*)    All other components within normal limits  TROPONIN I  HEMOGLOBIN A1C  LIPASE, BLOOD  COMPREHENSIVE METABOLIC PANEL  CBC  POC URINE PREG, ED  POCT PREGNANCY, URINE   ____________________________________________  EKG     ____________________________________________  RADIOLOGY   CT the head shows no acute disease CT of the belly sameexcept for circumferential bladder wall thickening patient has not urinated yet ____________________________________________   PROCEDURES  Procedure(s) performed:   Procedures  Critical Care performed:    ____________________________________________   INITIAL IMPRESSION / ASSESSMENT AND PLAN / ED COURSE patient continues to complain of a headache I will give her some more pain medication. Plan on admitting her for her pancreatitis that she continues to vomit.        ____________________________________________   FINAL CLINICAL IMPRESSION(S) / ED DIAGNOSES  Final diagnoses:  Acute pancreatitis without infection or necrosis, unspecified pancreatitis type  Nonintractable episodic headache, unspecified headache type     ED Discharge Orders    None       Note:  This document was prepared using Dragon voice recognition software and may include unintentional dictation errors.    Nena Polio, MD 01/03/17 2156

## 2017-01-04 ENCOUNTER — Inpatient Hospital Stay: Payer: Medicaid Other

## 2017-01-04 LAB — LIPASE, BLOOD: LIPASE: 113 U/L — AB (ref 11–51)

## 2017-01-04 LAB — COMPREHENSIVE METABOLIC PANEL
ALK PHOS: 201 U/L — AB (ref 38–126)
ALT: 25 U/L (ref 14–54)
ANION GAP: 10 (ref 5–15)
AST: 18 U/L (ref 15–41)
Albumin: 3.2 g/dL — ABNORMAL LOW (ref 3.5–5.0)
BILIRUBIN TOTAL: 0.8 mg/dL (ref 0.3–1.2)
BUN: 18 mg/dL (ref 6–20)
CALCIUM: 7.7 mg/dL — AB (ref 8.9–10.3)
CO2: 23 mmol/L (ref 22–32)
CREATININE: 0.95 mg/dL (ref 0.44–1.00)
Chloride: 106 mmol/L (ref 101–111)
Glucose, Bld: 296 mg/dL — ABNORMAL HIGH (ref 65–99)
Potassium: 4.1 mmol/L (ref 3.5–5.1)
Sodium: 139 mmol/L (ref 135–145)
TOTAL PROTEIN: 6.7 g/dL (ref 6.5–8.1)

## 2017-01-04 LAB — CBC
HCT: 33.4 % — ABNORMAL LOW (ref 35.0–47.0)
HEMOGLOBIN: 11.2 g/dL — AB (ref 12.0–16.0)
MCH: 30.8 pg (ref 26.0–34.0)
MCHC: 33.5 g/dL (ref 32.0–36.0)
MCV: 91.8 fL (ref 80.0–100.0)
PLATELETS: 155 10*3/uL (ref 150–440)
RBC: 3.64 MIL/uL — AB (ref 3.80–5.20)
RDW: 12.7 % (ref 11.5–14.5)
WBC: 4.5 10*3/uL (ref 3.6–11.0)

## 2017-01-04 LAB — HEMOGLOBIN A1C
Hgb A1c MFr Bld: 10.7 % — ABNORMAL HIGH (ref 4.8–5.6)
MEAN PLASMA GLUCOSE: 260.39 mg/dL

## 2017-01-04 LAB — GLUCOSE, CAPILLARY
GLUCOSE-CAPILLARY: 188 mg/dL — AB (ref 65–99)
GLUCOSE-CAPILLARY: 232 mg/dL — AB (ref 65–99)
GLUCOSE-CAPILLARY: 95 mg/dL (ref 65–99)
Glucose-Capillary: 242 mg/dL — ABNORMAL HIGH (ref 65–99)

## 2017-01-04 MED ORDER — LORAZEPAM 2 MG/ML IJ SOLN: 1.0000 mg | Freq: Four times a day (QID) | INTRAMUSCULAR | Status: AC | PRN

## 2017-01-04 MED ORDER — FOLIC ACID 1 MG PO TABS
1.0000 mg | ORAL_TABLET | Freq: Every day | ORAL | Status: DC
  Administered 2017-01-04 – 2017-01-07 (×4): 1 mg via ORAL
  Filled 2017-01-04 (×4): qty 1

## 2017-01-04 MED ORDER — ADULT MULTIVITAMIN W/MINERALS CH
1.0000 | ORAL_TABLET | Freq: Every day | ORAL | Status: DC
  Administered 2017-01-04 – 2017-01-07 (×4): 1 via ORAL
  Filled 2017-01-04 (×4): qty 1

## 2017-01-04 MED ORDER — LORAZEPAM 1 MG PO TABS: 1.0000 mg | ORAL_TABLET | Freq: Four times a day (QID) | ORAL | Status: AC | PRN

## 2017-01-04 MED ORDER — INSULIN ASPART 100 UNIT/ML ~~LOC~~ SOLN: 0.0000 [IU] | Freq: Every day | SUBCUTANEOUS | Status: DC

## 2017-01-04 MED ORDER — THIAMINE HCL 100 MG/ML IJ SOLN
100.0000 mg | Freq: Every day | INTRAMUSCULAR | Status: DC
  Administered 2017-01-04: 100 mg via INTRAVENOUS
  Filled 2017-01-04: qty 2

## 2017-01-04 MED ORDER — INSULIN ASPART 100 UNIT/ML ~~LOC~~ SOLN
0.0000 [IU] | Freq: Three times a day (TID) | SUBCUTANEOUS | Status: DC
  Administered 2017-01-04: 5 [IU] via SUBCUTANEOUS
  Administered 2017-01-05 (×3): 3 [IU] via SUBCUTANEOUS
  Administered 2017-01-06: 2 [IU] via SUBCUTANEOUS
  Administered 2017-01-06: 5 [IU] via SUBCUTANEOUS
  Administered 2017-01-06: 3 [IU] via SUBCUTANEOUS
  Administered 2017-01-07: 8 [IU] via SUBCUTANEOUS
  Filled 2017-01-04 (×8): qty 1

## 2017-01-04 MED ORDER — VITAMIN B-1 100 MG PO TABS
100.0000 mg | ORAL_TABLET | Freq: Every day | ORAL | Status: DC
  Administered 2017-01-05 – 2017-01-07 (×3): 100 mg via ORAL
  Filled 2017-01-04 (×3): qty 1

## 2017-01-04 MED ORDER — INSULIN ASPART PROT & ASPART (70-30 MIX) 100 UNIT/ML ~~LOC~~ SUSP
15.0000 [IU] | Freq: Two times a day (BID) | SUBCUTANEOUS | Status: DC
  Administered 2017-01-04 – 2017-01-07 (×6): 15 [IU] via SUBCUTANEOUS
  Filled 2017-01-04 (×6): qty 1

## 2017-01-04 MED ORDER — INSULIN ASPART 100 UNIT/ML ~~LOC~~ SOLN
0.0000 [IU] | SUBCUTANEOUS | Status: DC
  Administered 2017-01-04: 3 [IU] via SUBCUTANEOUS
  Administered 2017-01-04: 5 [IU] via SUBCUTANEOUS
  Filled 2017-01-04 (×2): qty 1

## 2017-01-04 MED ORDER — PROMETHAZINE HCL 25 MG/ML IJ SOLN: 12.5000 mg | Freq: Four times a day (QID) | INTRAMUSCULAR | Status: DC | PRN

## 2017-01-04 NOTE — Progress Notes (Addendum)
Inpatient Diabetes Program Recommendations  AACE/ADA: New Consensus Statement on Inpatient Glycemic Control (2015)  Target Ranges:  Prepandial:   less than 140 mg/dL      Peak postprandial:   less than 180 mg/dL (1-2 hours)      Critically ill patients:  140 - 180 mg/dL   Results for Karen [name], Karen [name] (MRN [id number]) as of 01/04/2017 09:31  Ref. Range 01/03/2017 17:58 01/03/2017 21:21 01/04/2017 08:06  Glucose-Capillary Latest Ref Range: 65 - 99 mg/dL 214 (H) 200 (H) 188 (H)   Results for Karen [name], Karen [name] (MRN [id number]) as of 01/04/2017 09:31  Ref. Range 12/11/2016 17:44 01/03/2017 13:07  Hemoglobin A1C Latest Ref Range: 4.8 - 5.6 % 10.3 (H) 10.7 (H)    Admit with: Acute Pancreatitis   History: DM, ETOH Abuse  Home DM Meds: 70/30 Insulin- 15 units BID  Current Insulin Orders: Novolog Moderate Correction Scale/ SSI (0-15 units) Q4 hours        MD- Patient seen by DM Coordinator during last admission on 12/10/16.  Note patient takes 70/30 Insulin at home.  Will likely need basal insulin while hospitalized, however, 70/30 Insulin not ideal for patient with NPO status.  Recommend initiation of Lantus 15 units daily (0.3 units/kg dosing).    (Patient gets about 21 units basal insulin through her 70/30 insulin divided into 2 doses at home)  Can convert back to 70/30 insulin at time of discharge.     --Will follow patient during hospitalization--  Wyn Quaker RN, MSN, CDE Diabetes Coordinator Inpatient Glycemic Control Team Team Pager: [phone] (8a-5p)

## 2017-01-04 NOTE — Progress Notes (Signed)
Initial Nutrition Assessment  DOCUMENTATION CODES:   Not applicable  INTERVENTION:  Glucerna Shake po TID, each supplement provides 220 kcal and 10 grams of protein w/ diet advancement  NUTRITION DIAGNOSIS:   Inadequate oral intake related to poor appetite as evidenced by per patient/family report.  GOAL:   Patient will meet greater than or equal to 90% of their needs  MONITOR:   PO intake, I & O's, Labs, Weight trends, Diet advancement  REASON FOR ASSESSMENT:   Malnutrition Screening Tool    ASSESSMENT:   Karen [name] is a 61 yo female with PMH of HTN, DM, ETOH abuse, and alcoholic pancreatitis, presents with nausea and vomiting, dysuria, UTI, poorly controlled diabetes, and acute pancreatitis.   Spoke with patient at bedside. She reports normally eating leftovers that she cooked for dinner, for breakfast, if she eats breakfast. Usually snacks through lunch. Will cook for dinner. Reports that when she drinks alcohol she continues to eat. Says she has a 5 pound weight loss over the past month due to decreased appetite. No evidence for this per chart.  Labs reviewed:  CBGs 188, 200, 214 Lipase 113  Medications reviewed and include:  Folic acid, Colace, Insulin, MVI w/ minerals, Thiamine NS at 142mL/hr  NUTRITION - FOCUSED PHYSICAL EXAM:    Most Recent Value  Orbital Region  No depletion  Upper Arm Region  No depletion  Thoracic and Lumbar Region  No depletion  Buccal Region  No depletion  Temple Region  No depletion  Clavicle Bone Region  No depletion  Clavicle and Acromion Bone Region  No depletion  Scapular Bone Region  No depletion  Dorsal Hand  No depletion  Patellar Region  No depletion  Anterior Thigh Region  No depletion  Posterior Calf Region  No depletion  Edema (RD Assessment)  None  Hair  Reviewed  Eyes  Reviewed  Mouth  Reviewed  Skin  Reviewed  Nails  Reviewed       Diet Order:  Diet clear liquid Room service appropriate? Yes; Fluid  consistency: Thin  EDUCATION NEEDS:   No education needs have been identified at this time  Skin:  Skin Assessment: Reviewed RN Assessment  Last BM:  12/30  Height:   Ht Readings from Last 1 Encounters:  01/03/17 5\' 1"  (1.549 m)    Weight:   Wt Readings from Last 1 Encounters:  01/04/17 116 lb 8 oz (52.8 kg)    Ideal Body Weight:  47.72 kg  BMI:  Body mass index is 22.01 kg/m.  Estimated Nutritional Needs:   Kcal:  9476-5465 calories  Protein:  63-79 grams (1.2-1.5g/kg)  Fluid:  >1.5L  Satira Anis. [place] Name, MS, RD LDN Inpatient Clinical Dietitian Pager [phone]

## 2017-01-04 NOTE — Progress Notes (Signed)
Patient ID: Karen [name], female   DOB: 02/01/1955, 61 y.o.   MRN: [id number]  [facility] PROGRESS NOTE  Karen [name] OAC:[id number] DOB: 1955-12-17 DOA: 01/03/2017 PCP: Marden Noble, MD  HPI/Subjective: Patient drinks alcohol.  Sometimes gets shaky if she does not drink.  Complaining of some right flank pain.  Some nausea and vomiting.  No diarrhea.  Some headache.  Objective: Vitals:   01/04/17 0357 01/04/17 0700  BP: (!) 156/82 (!) 148/86  Pulse: 85 84  Resp: 18   Temp: 98.8 F (37.1 C) 98.2 F (36.8 C)  SpO2: 99%     Filed Weights   01/03/17 1305 01/04/17 0357  Weight: 49.9 kg (110 lb) 52.8 kg (116 lb 8 oz)    ROS: Review of Systems  Constitutional: Negative for chills and fever.  Eyes: Negative for blurred vision.  Respiratory: Negative for cough and shortness of breath.   Cardiovascular: Negative for chest pain.  Gastrointestinal: Positive for abdominal pain, nausea and vomiting. Negative for constipation and diarrhea.  Genitourinary: Negative for dysuria.  Musculoskeletal: Negative for joint pain.  Neurological: Positive for headaches. Negative for dizziness.   Exam: Physical Exam  Constitutional: She is oriented to person, place, and time.  HENT:  Nose: No mucosal edema.  Mouth/Throat: No oropharyngeal exudate or posterior oropharyngeal edema.  Eyes: Conjunctivae, EOM and lids are normal. Pupils are equal, round, and reactive to light.  Neck: No JVD present. Carotid bruit is not present. No edema present. No thyroid mass and no thyromegaly present.  Cardiovascular: S1 normal and S2 normal. Exam reveals no gallop.  No murmur heard. Pulses:      Dorsalis pedis pulses are 2+ on the right side, and 2+ on the left side.  Respiratory: No respiratory distress. She has decreased breath sounds in the right lower field and the left lower field. She has no wheezes. She has no rhonchi. She has no rales.  GI: Soft. Bowel sounds are normal. There is tenderness in the  right upper quadrant.  Musculoskeletal:       Right ankle: She exhibits no swelling.       Left ankle: She exhibits no swelling.  Lymphadenopathy:    She has no cervical adenopathy.  Neurological: She is alert and oriented to person, place, and time. No cranial nerve deficit.  Skin: Skin is warm. No rash noted. Nails show no clubbing.  Psychiatric: She has a normal mood and affect.      Data Reviewed: Basic Metabolic Panel: Recent Labs  Lab 01/03/17 1307 01/04/17 0318  NA 131* 139  K 4.0 4.1  CL 98* 106  CO2 21* 23  GLUCOSE 375* 296*  BUN 22* 18  CREATININE 1.05* 0.95  CALCIUM 8.9 7.7*   Liver Function Tests: Recent Labs  Lab 01/03/17 1307 01/04/17 0318  AST 31 18  ALT 33 25  ALKPHOS 268* 201*  BILITOT 0.7 0.8  PROT 8.3* 6.7  ALBUMIN 3.8 3.2*   Recent Labs  Lab 01/03/17 1307 01/04/17 0318  LIPASE 350* 113*   CBC: Recent Labs  Lab 01/03/17 1307 01/04/17 0318  WBC 8.0 4.5  NEUTROABS 4.5  --   HGB 12.5 11.2*  HCT 36.5 33.4*  MCV 91.4 91.8  PLT 177 155   Cardiac Enzymes: Recent Labs  Lab 01/03/17 1307  TROPONINI <0.03    CBG: Recent Labs  Lab 01/03/17 1758 01/03/17 2121 01/04/17 0806 01/04/17 1159  GLUCAP 214* 200* 188* 232*      Studies: Ct  Head Wo Contrast  Result Date: 01/03/2017 CLINICAL DATA:  Headache. Diabetes. Hypertension. Elevated blood sugar. EXAM: CT HEAD WITHOUT CONTRAST TECHNIQUE: Contiguous axial images were obtained from the base of the skull through the vertex without intravenous contrast. COMPARISON:  04/21/2016 brain MR. 04/20/2016 CT. FINDINGS: Brain: Mild age advanced cerebral atrophy. No mass lesion, hemorrhage, hydrocephalus, acute infarct, intra-axial, or extra-axial fluid collection. Vascular: Intracranial atherosclerosis. Skull: Normal Sinuses/Orbits: Normal imaged portions of the orbits and globes. Clear paranasal sinuses and mastoid air cells. Other: None. IMPRESSION: 1.  No acute intracranial abnormality. 2.  Cerebral atrophy. Electronically Signed   By: Abigail Miyamoto M.D.   On: 01/03/2017 14:32   Ct Abdomen Pelvis W Contrast  Result Date: 01/03/2017 CLINICAL DATA:  Right upper quadrant pain and dysuria. EXAM: CT ABDOMEN AND PELVIS WITH CONTRAST TECHNIQUE: Multidetector CT imaging of the abdomen and pelvis was performed using the standard protocol following bolus administration of intravenous contrast. CONTRAST:  35mL ISOVUE-300 IOPAMIDOL (ISOVUE-300) INJECTION 61% COMPARISON:  12/10/2016 FINDINGS: Lower chest:  Unremarkable. Hepatobiliary: No focal abnormality within the liver parenchyma. There is no evidence for gallstones, gallbladder wall thickening, or pericholecystic fluid. 7 mm diameter common bile duct is stable in the interval. Pancreas: No focal mass lesion. No dilatation of the main duct. No intraparenchymal cyst. No peripancreatic edema. Spleen: No splenomegaly. No focal mass lesion. Adrenals/Urinary Tract: No adrenal nodule or mass. 2 mm nonobstructing stone identified upper pole right kidney. Left kidney unremarkable. No evidence for hydroureter. Circumferential bladder wall thickening and trabeculation noted. Stomach/Bowel: Tiny hiatal hernia. Stomach is nondistended. No gastric wall thickening. No evidence of outlet obstruction. Duodenum is normally positioned as is the ligament of Treitz. No small bowel wall thickening. No small bowel dilatation. The terminal ileum is normal. The appendix is not visualized, but there is no edema or inflammation in the region of the cecum. Diverticuli are seen scattered along the entire length of the colon without CT findings of diverticulitis. Vascular/Lymphatic: There is abdominal aortic atherosclerosis without aneurysm. There is no gastrohepatic or hepatoduodenal ligament lymphadenopathy. No intraperitoneal or retroperitoneal lymphadenopathy. No pelvic sidewall lymphadenopathy. Reproductive: Uterus surgically absent.  There is no adnexal mass. Other: No  intraperitoneal free fluid. Musculoskeletal: Bone windows reveal no worrisome lytic or sclerotic osseous lesions. IMPRESSION: 1. No acute findings in the abdomen or pelvis. 2. 2 mm nonobstructing stone upper pole right kidney. 3. Circumferential bladder wall thickening bladder infection/inflammation could have this appearance. 4.  Aortic Atherosclerois (ICD10-170.0) Electronically Signed   By: Misty Stanley M.D.   On: 01/03/2017 14:33   US Abdomen Limited Ruq  Result Date: 01/04/2017 CLINICAL DATA:  Upper abdominal pain.  History of pancreatitis EXAM: ULTRASOUND ABDOMEN LIMITED RIGHT UPPER QUADRANT COMPARISON:  CT abdomen and pelvis January 03, 2017. FINDINGS: Gallbladder: No gallstones or wall thickening visualized. There is no pericholecystic fluid. No sonographic Murphy sign noted by sonographer. Common bile duct: Diameter: 9 mm, prominent. No intrahepatic duct dilatation seen. No biliary duct mass or calculus evident. Liver: No focal lesion identified. Liver echogenicity is increased. Portal vein is patent on color Doppler imaging with normal direction of blood flow towards the liver. No evident ascites. Visualized portions of pancreas appear unremarkable. IMPRESSION: 1. Prominence of the common bile duct without mass or calculus evident by ultrasound. No gallbladder pathology evident. 2. Increase in liver echogenicity, a finding felt to be indicative of a degree of hepatic steatosis. While no focal liver lesions are evident on this study, it must be cautioned that sensitivity of  ultrasound for detection of focal liver lesions is diminished in this circumstance. Electronically Signed   By: Lowella Grip III M.D.   On: 01/04/2017 09:35    Scheduled Meds: . amLODipine  5 mg Oral Daily  . atorvastatin  40 mg Oral q1800  . cyclobenzaprine  5 mg Oral BID  . docusate sodium  100 mg Oral BID  . folic acid  1 mg Oral Daily  . heparin  5,000 Units Subcutaneous Q8H  . insulin aspart  0-15 Units  Subcutaneous TID WC  . insulin aspart  0-5 Units Subcutaneous QHS  . insulin aspart protamine- aspart  15 Units Subcutaneous BID WC  . multivitamin with minerals  1 tablet Oral Daily  . pantoprazole (PROTONIX) IV  40 mg Intravenous Q12H  . temazepam  15 mg Oral QHS  . thiamine  100 mg Oral Daily   Or  . thiamine  100 mg Intravenous Daily   Continuous Infusions: . sodium chloride 125 mL/hr at 01/04/17 1104    Assessment/Plan:  1. Abdominal pain with nausea vomiting.  Elevated lipase which could be consistent with a pancreatitis but CT scan did not show any inflammation around the pancreas.  Could also be gastroparesis.  Patient does have an allergy to Reglan.  Could be alcohol withdrawal or pain medication withdrawal.  Patient on as needed nausea medications and pain medication.  Empiric Protonix.  Lipase did trend better.  Currently on clear liquid diet and IV fluids. 2. Alcohol abuse put on CIWA protocol.  Patient must stop drinking 3. Type 2 diabetes mellitus put back on 70/30 insulin 15 units twice daily 4. Essential hypertension on Norvasc 5. Hyperlipidemia unspecified on atorvastatin 6. Hypertriglyceridemia likely secondary to diabetes and alcohol abuse  Code Status:     Code Status Orders  (From admission, onward)        Start     Ordered   01/03/17 1730  Full code  Continuous     01/03/17 1729    Code Status History    Date Active Date Inactive Code Status Order ID Comments User Context   12/10/2016 12:01 12/12/2016 17:12 Full Code [id number]  Demetrios Loll, MD Inpatient   10/15/2016 23:50 10/17/2016 00:56 Full Code [id number]  Dustin Flock, MD Inpatient   04/21/2016 05:05 04/24/2016 20:31 Full Code [id number]  Saundra Shelling, MD Inpatient   12/31/2015 07:39 01/01/2016 20:08 Full Code [id number]  Saundra Shelling, MD Inpatient   12/10/2015 11:09 12/12/2015 18:48 Full Code [id number]  Demetrios Loll, MD Inpatient   10/29/2015 15:45 11/01/2015 16:23 Full Code [id number]  Theodoro Grist,  MD Inpatient   02/17/2015 17:04 02/19/2015 18:04 Full Code [id number]  Nicholes Mango, MD Inpatient     Disposition Plan: To be determined based on clinical course  Time spent: 28 minutes  [place]

## 2017-01-04 NOTE — Progress Notes (Signed)
Pt requesting food, [place] complete, MD stated to resume clear liquid diet

## 2017-01-05 LAB — COMPREHENSIVE METABOLIC PANEL
ALT: 25 U/L (ref 14–54)
AST: 32 U/L (ref 15–41)
Albumin: 2.8 g/dL — ABNORMAL LOW (ref 3.5–5.0)
Alkaline Phosphatase: 172 U/L — ABNORMAL HIGH (ref 38–126)
Anion gap: 7 (ref 5–15)
BUN: 9 mg/dL (ref 6–20)
CHLORIDE: 110 mmol/L (ref 101–111)
CO2: 22 mmol/L (ref 22–32)
CREATININE: 0.94 mg/dL (ref 0.44–1.00)
Calcium: 7.1 mg/dL — ABNORMAL LOW (ref 8.9–10.3)
Glucose, Bld: 158 mg/dL — ABNORMAL HIGH (ref 65–99)
Potassium: 3.5 mmol/L (ref 3.5–5.1)
Sodium: 139 mmol/L (ref 135–145)
TOTAL PROTEIN: 6.1 g/dL — AB (ref 6.5–8.1)
Total Bilirubin: 0.7 mg/dL (ref 0.3–1.2)

## 2017-01-05 LAB — GLUCOSE, CAPILLARY
GLUCOSE-CAPILLARY: 155 mg/dL — AB (ref 65–99)
Glucose-Capillary: 154 mg/dL — ABNORMAL HIGH (ref 65–99)
Glucose-Capillary: 153 mg/dL — ABNORMAL HIGH (ref 65–99)
Glucose-Capillary: 148 mg/dL — ABNORMAL HIGH (ref 65–99)

## 2017-01-05 LAB — LIPASE, BLOOD: LIPASE: 51 U/L (ref 11–51)

## 2017-01-05 MED ORDER — MORPHINE SULFATE (PF) 4 MG/ML IV SOLN
2.0000 mg | INTRAVENOUS | Status: DC | PRN
  Administered 2017-01-05 – 2017-01-06 (×2): 2 mg via INTRAVENOUS
  Filled 2017-01-05 (×2): qty 1

## 2017-01-05 MED ORDER — POTASSIUM CHLORIDE IN NACL 20-0.9 MEQ/L-% IV SOLN
INTRAVENOUS | Status: DC
  Administered 2017-01-05: 16:00:00 via INTRAVENOUS
  Filled 2017-01-05 (×2): qty 1000

## 2017-01-05 NOTE — Progress Notes (Signed)
Patient ID: Karen [name], female   DOB: 14-Apr-1955, 62 y.o.   MRN: [id number]  [facility] PROGRESS NOTE  Karen [name] NFA:[id number] DOB: 1955/07/03 DOA: 01/03/2017 PCP: Marden Noble, MD  HPI/Subjective: Patient still with nausea.  Vomiting has settled down.  But still having some dry heaves.  Still with some abdominal pain.  Objective: Vitals:   01/05/17 0322 01/05/17 0938  BP: 139/82 (!) 146/72  Pulse: 91 90  Resp: 18 20  Temp: 97.8 F (36.6 C) 97.7 F (36.5 C)  SpO2: 94% 97%    Filed Weights   01/03/17 1305 01/04/17 0357 01/05/17 0322  Weight: 49.9 kg (110 lb) 52.8 kg (116 lb 8 oz) 57 kg (125 lb 11.2 oz)    ROS: Review of Systems  Constitutional: Negative for chills and fever.  Eyes: Negative for blurred vision.  Respiratory: Negative for cough and shortness of breath.   Cardiovascular: Negative for chest pain.  Gastrointestinal: Positive for abdominal pain and nausea. Negative for constipation, diarrhea and vomiting.  Genitourinary: Negative for dysuria.  Musculoskeletal: Negative for joint pain.  Neurological: Positive for headaches. Negative for dizziness.   Exam: Physical Exam  Constitutional: She is oriented to person, place, and time.  HENT:  Nose: No mucosal edema.  Mouth/Throat: No oropharyngeal exudate or posterior oropharyngeal edema.  Eyes: Conjunctivae, EOM and lids are normal. Pupils are equal, round, and reactive to light.  Neck: No JVD present. Carotid bruit is not present. No edema present. No thyroid mass and no thyromegaly present.  Cardiovascular: S1 normal and S2 normal. Exam reveals no gallop.  No murmur heard. Pulses:      Dorsalis pedis pulses are 2+ on the right side, and 2+ on the left side.  Respiratory: No respiratory distress. She has decreased breath sounds in the right lower field and the left lower field. She has no wheezes. She has no rhonchi. She has no rales.  GI: Soft. Bowel sounds are normal. There is tenderness in the  right upper quadrant.  Musculoskeletal:       Right ankle: She exhibits no swelling.       Left ankle: She exhibits no swelling.  Lymphadenopathy:    She has no cervical adenopathy.  Neurological: She is alert and oriented to person, place, and time. No cranial nerve deficit.  Skin: Skin is warm. No rash noted. Nails show no clubbing.  Psychiatric: She has a normal mood and affect.      Data Reviewed: Basic Metabolic Panel: Recent Labs  Lab 01/03/17 1307 01/04/17 0318 01/05/17 0441  NA 131* 139 139  K 4.0 4.1 3.5  CL 98* 106 110  CO2 21* 23 22  GLUCOSE 375* 296* 158*  BUN 22* 18 9  CREATININE 1.05* 0.95 0.94  CALCIUM 8.9 7.7* 7.1*   Liver Function Tests: Recent Labs  Lab 01/03/17 1307 01/04/17 0318 01/05/17 0441  AST 31 18 32  ALT 33 25 25  ALKPHOS 268* 201* 172*  BILITOT 0.7 0.8 0.7  PROT 8.3* 6.7 6.1*  ALBUMIN 3.8 3.2* 2.8*   Recent Labs  Lab 01/03/17 1307 01/04/17 0318 01/05/17 0441  LIPASE 350* 113* 51   CBC: Recent Labs  Lab 01/03/17 1307 01/04/17 0318  WBC 8.0 4.5  NEUTROABS 4.5  --   HGB 12.5 11.2*  HCT 36.5 33.4*  MCV 91.4 91.8  PLT 177 155   Cardiac Enzymes: Recent Labs  Lab 01/03/17 1307  TROPONINI <0.03    CBG: Recent Labs  Lab 01/04/17  1159 01/04/17 1720 01/04/17 2116 01/05/17 0740 01/05/17 1134  GLUCAP 232* 242* 95 155* 154*      Studies: US Abdomen Limited Ruq  Result Date: 01/04/2017 CLINICAL DATA:  Upper abdominal pain.  History of pancreatitis EXAM: ULTRASOUND ABDOMEN LIMITED RIGHT UPPER QUADRANT COMPARISON:  CT abdomen and pelvis January 03, 2017. FINDINGS: Gallbladder: No gallstones or wall thickening visualized. There is no pericholecystic fluid. No sonographic Murphy sign noted by sonographer. Common bile duct: Diameter: 9 mm, prominent. No intrahepatic duct dilatation seen. No biliary duct mass or calculus evident. Liver: No focal lesion identified. Liver echogenicity is increased. Portal vein is patent on  color Doppler imaging with normal direction of blood flow towards the liver. No evident ascites. Visualized portions of pancreas appear unremarkable. IMPRESSION: 1. Prominence of the common bile duct without mass or calculus evident by ultrasound. No gallbladder pathology evident. 2. Increase in liver echogenicity, a finding felt to be indicative of a degree of hepatic steatosis. While no focal liver lesions are evident on this study, it must be cautioned that sensitivity of ultrasound for detection of focal liver lesions is diminished in this circumstance. Electronically Signed   By: Lowella Grip III M.D.   On: 01/04/2017 09:35    Scheduled Meds: . amLODipine  5 mg Oral Daily  . atorvastatin  40 mg Oral q1800  . cyclobenzaprine  5 mg Oral BID  . docusate sodium  100 mg Oral BID  . folic acid  1 mg Oral Daily  . heparin  5,000 Units Subcutaneous Q8H  . insulin aspart  0-15 Units Subcutaneous TID WC  . insulin aspart  0-5 Units Subcutaneous QHS  . insulin aspart protamine- aspart  15 Units Subcutaneous BID WC  . multivitamin with minerals  1 tablet Oral Daily  . pantoprazole (PROTONIX) IV  40 mg Intravenous Q12H  . temazepam  15 mg Oral QHS  . thiamine  100 mg Oral Daily   Or  . thiamine  100 mg Intravenous Daily   Continuous Infusions: . sodium chloride 125 mL/hr at 01/05/17 0338    Assessment/Plan:  1. Abdominal pain with nausea vomiting.  Elevated lipase which could be consistent with a pancreatitis but CT scan did not show any inflammation around the pancreas.  Could also be gastroparesis.  Patient does have an allergy to Reglan.  Could be alcohol withdrawal or pain medication withdrawal.  Patient on as needed nausea medications and pain medication.  Empiric Protonix.  Lipase normalized.  Patient still with symptoms.  Supportive care.  Decrease rate of IV fluids at this point. 2. Alcohol abuse put on CIWA protocol.  Patient must stop drinking alcohol. 3. Type 2 diabetes mellitus  put back on 70/30 insulin 15 units twice daily.  Hemoglobin A1c 10.7.  Likely will need higher dose of 7030 insulin upon discharge home once on solid food. 4. Essential hypertension on Norvasc 5. Hyperlipidemia unspecified on atorvastatin 6. Hypertriglyceridemia likely secondary to diabetes and alcohol abuse  Code Status:     Code Status Orders  (From admission, onward)        Start     Ordered   01/03/17 1730  Full code  Continuous     01/03/17 1729    Code Status History    Date Active Date Inactive Code Status Order ID Comments User Context   12/10/2016 12:01 12/12/2016 17:12 Full Code [id number]  Demetrios Loll, MD Inpatient   10/15/2016 23:50 10/17/2016 00:56 Full Code [id number]  Dustin Flock, MD Inpatient  04/21/2016 05:05 04/24/2016 20:31 Full Code [id number]  Saundra Shelling, MD Inpatient   12/31/2015 07:39 01/01/2016 20:08 Full Code [id number]  Saundra Shelling, MD Inpatient   12/10/2015 11:09 12/12/2015 18:48 Full Code [id number]  Demetrios Loll, MD Inpatient   10/29/2015 15:45 11/01/2015 16:23 Full Code [id number]  Theodoro Grist, MD Inpatient   02/17/2015 17:04 02/19/2015 18:04 Full Code [id number]  Nicholes Mango, MD Inpatient     Disposition Plan: To be determined based on clinical course  Time spent: 25 minutes  [place]

## 2017-01-06 ENCOUNTER — Inpatient Hospital Stay: Payer: Medicaid Other

## 2017-01-06 LAB — BASIC METABOLIC PANEL
ANION GAP: 7 (ref 5–15)
BUN: 6 mg/dL (ref 6–20)
CO2: 25 mmol/L (ref 22–32)
Calcium: 8 mg/dL — ABNORMAL LOW (ref 8.9–10.3)
Chloride: 108 mmol/L (ref 101–111)
Creatinine, Ser: 0.85 mg/dL (ref 0.44–1.00)
GFR calc Af Amer: >60 mL/min (ref >60)
GLUCOSE: 181 mg/dL — AB (ref 65–99)
POTASSIUM: 3.3 mmol/L — AB (ref 3.5–5.1)
Sodium: 140 mmol/L (ref 135–145)

## 2017-01-06 LAB — MAGNESIUM: Magnesium: 1.9 mg/dL (ref 1.7–2.4)

## 2017-01-06 LAB — TROPONIN I: Troponin I: <0.03 ng/mL (ref <0.03)

## 2017-01-06 LAB — GLUCOSE, CAPILLARY
GLUCOSE-CAPILLARY: 194 mg/dL — AB (ref 65–99)
GLUCOSE-CAPILLARY: 134 mg/dL — AB (ref 65–99)
GLUCOSE-CAPILLARY: 180 mg/dL — AB (ref 65–99)
Glucose-Capillary: 232 mg/dL — ABNORMAL HIGH (ref 65–99)

## 2017-01-06 LAB — PHOSPHORUS: Phosphorus: 2.2 mg/dL — ABNORMAL LOW (ref 2.5–4.6)

## 2017-01-06 MED ORDER — FUROSEMIDE 10 MG/ML IJ SOLN
20.0000 mg | Freq: Two times a day (BID) | INTRAMUSCULAR | Status: DC
  Administered 2017-01-06 – 2017-01-07 (×3): 20 mg via INTRAVENOUS
  Filled 2017-01-06 (×4): qty 4

## 2017-01-06 MED ORDER — HYDRALAZINE HCL 20 MG/ML IJ SOLN
INTRAMUSCULAR | Status: AC
  Administered 2017-01-06: 19:00:00
  Filled 2017-01-06: qty 1

## 2017-01-06 MED ORDER — HYDRALAZINE HCL 20 MG/ML IJ SOLN: 20.0000 mg | Freq: Four times a day (QID) | INTRAMUSCULAR | Status: DC | PRN

## 2017-01-06 MED ORDER — AMLODIPINE BESYLATE 5 MG PO TABS
5.0000 mg | ORAL_TABLET | Freq: Once | ORAL | Status: AC
  Administered 2017-01-06: 5 mg via ORAL
  Filled 2017-01-06: qty 1

## 2017-01-06 MED ORDER — POTASSIUM CHLORIDE CRYS ER 20 MEQ PO TBCR
20.0000 meq | EXTENDED_RELEASE_TABLET | Freq: Once | ORAL | Status: AC
  Administered 2017-01-06: 20 meq via ORAL
  Filled 2017-01-06: qty 1

## 2017-01-06 MED ORDER — GLUCERNA SHAKE PO LIQD
237.0000 mL | Freq: Three times a day (TID) | ORAL | Status: DC
  Administered 2017-01-06: 237 mL via ORAL

## 2017-01-06 MED ORDER — GUAIFENESIN 100 MG/5ML PO SOLN
15.0000 mL | ORAL | Status: DC | PRN
  Filled 2017-01-06: qty 15

## 2017-01-06 MED ORDER — DOXYCYCLINE HYCLATE 100 MG PO TABS
100.0000 mg | ORAL_TABLET | Freq: Two times a day (BID) | ORAL | Status: DC
  Administered 2017-01-06 – 2017-01-07 (×2): 100 mg via ORAL
  Filled 2017-01-06 (×2): qty 1

## 2017-01-06 MED ORDER — HYDRALAZINE HCL 25 MG PO TABS
25.0000 mg | ORAL_TABLET | Freq: Three times a day (TID) | ORAL | Status: DC
  Administered 2017-01-06 – 2017-01-07 (×2): 25 mg via ORAL
  Filled 2017-01-06 (×2): qty 1

## 2017-01-06 MED ORDER — AMLODIPINE BESYLATE 10 MG PO TABS
10.0000 mg | ORAL_TABLET | Freq: Every day | ORAL | Status: DC
  Administered 2017-01-07: 10 mg via ORAL
  Filled 2017-01-06: qty 1

## 2017-01-06 NOTE — Progress Notes (Signed)
Paged [facility] doc d/t  no return call from Dr. Posey Pronto.

## 2017-01-06 NOTE — Progress Notes (Signed)
New orders received.  See MAR.

## 2017-01-06 NOTE — Progress Notes (Signed)
Paged Dr. Vianne Bulls (Prime doc) for HTN. Instructed to notify Dr. Posey Pronto until 18:30. Paged Dr. Posey Pronto awaiting return call.

## 2017-01-06 NOTE — Progress Notes (Signed)
Patient ID: Karen [name], female   DOB: [birth year], 62 y.o.   MRN: [id number]  [facility] PROGRESS NOTE  AMAMDA CURBOW XBJ:[id number] DOB: [birth year] DOA: 01/03/2017 PCP: Marden Noble, MD  HPI/Subjective: Patient having multiple complaints , including shortness of breath chest pressure and swelling   objective: Vitals:   01/06/17 0800 01/06/17 1402  BP: (!) 171/88 (!) 158/94  Pulse: 90   Resp: 20   Temp: 98.8 F (37.1 C)   SpO2: 98%     Filed Weights   01/04/17 0357 01/05/17 0322 01/06/17 0500  Weight: 116 lb 8 oz (52.8 kg) 125 lb 11.2 oz (57 kg) 127 lb 6.8 oz (57.8 kg)    ROS: Review of Systems  Constitutional: Negative for chills and fever.  Eyes: Negative for blurred vision.  Respiratory: Negative for cough and shortness of breath.   Cardiovascular: Negative for chest pain.  Gastrointestinal: Positive for abdominal pain and nausea. Negative for constipation, diarrhea and vomiting.  Genitourinary: Negative for dysuria.  Musculoskeletal: Negative for joint pain.  Neurological: Positive for headaches. Negative for dizziness.   Exam: Physical Exam  Constitutional: She is oriented to person, place, and time.  HENT:  Nose: No mucosal edema.  Mouth/Throat: No oropharyngeal exudate or posterior oropharyngeal edema.  Eyes: Conjunctivae, EOM and lids are normal. Pupils are equal, round, and reactive to light.  Neck: No JVD present. Carotid bruit is not present. No edema present. No thyroid mass and no thyromegaly present.  Cardiovascular: S1 normal and S2 normal. Exam reveals no gallop.  No murmur heard. Pulses:      Dorsalis pedis pulses are 2+ on the right side, and 2+ on the left side.  Respiratory: No respiratory distress. She has decreased breath sounds in the right lower field and the left lower field. She has no wheezes. She has no rhonchi. She has no rales.  GI: Soft. Bowel sounds are normal. There is tenderness in the right upper quadrant.  Musculoskeletal:      Right ankle: She exhibits no swelling.       Left ankle: She exhibits no swelling.  Lymphadenopathy:    She has no cervical adenopathy.  Neurological: She is alert and oriented to person, place, and time. No cranial nerve deficit.  Skin: Skin is warm. No rash noted. Nails show no clubbing.  Psychiatric: She has a normal mood and affect.      Data Reviewed: Basic Metabolic Panel: Recent Labs  Lab 01/03/17 1307 01/04/17 0318 01/05/17 0441 01/06/17 0418  NA 131* 139 139 140  K 4.0 4.1 3.5 3.3*  CL 98* 106 110 108  CO2 21* 23 22 25   GLUCOSE 375* 296* 158* 181*  BUN 22* 18 9 6   CREATININE 1.05* 0.95 0.94 0.85  CALCIUM 8.9 7.7* 7.1* 8.0*  MG  --   --   --  1.9  PHOS  --   --   --  2.2*   Liver Function Tests: Recent Labs  Lab 01/03/17 1307 01/04/17 0318 01/05/17 0441  AST 31 18 32  ALT 33 25 25  ALKPHOS 268* 201* 172*  BILITOT 0.7 0.8 0.7  PROT 8.3* 6.7 6.1*  ALBUMIN 3.8 3.2* 2.8*   Recent Labs  Lab 01/03/17 1307 01/04/17 0318 01/05/17 0441  LIPASE 350* 113* 51   CBC: Recent Labs  Lab 01/03/17 1307 01/04/17 0318  WBC 8.0 4.5  NEUTROABS 4.5  --   HGB 12.5 11.2*  HCT 36.5 33.4*  MCV 91.4 91.8  PLT  177 155   Cardiac Enzymes: Recent Labs  Lab 01/03/17 1307 01/06/17 0942  TROPONINI <0.03 <0.03    CBG: Recent Labs  Lab 01/05/17 1641 01/05/17 2055 01/06/17 0731 01/06/17 1122 01/06/17 1714  GLUCAP 153* 148* 194* 134* 232*      Studies: Dg Chest 2 View  Result Date: 01/06/2017 CLINICAL DATA:  Shortness of breath. EXAM: CHEST  2 VIEW COMPARISON:  Fall last 17. FINDINGS: Trachea is midline. Heart size normal. Question mild peribronchovascular thickening at the left lung base. No airspace consolidation. Tiny left pleural effusion. IMPRESSION: 1. Question peribronchovascular thickening at the left lung base, raising suspicion for an infectious bronchiolitis. 2. Trace left pleural effusion. Electronically Signed   By: Lorin Picket M.D.   On:  01/06/2017 09:45    Scheduled Meds: . [START ON 01/07/2017] amLODipine  10 mg Oral Daily  . atorvastatin  40 mg Oral q1800  . cyclobenzaprine  5 mg Oral BID  . docusate sodium  100 mg Oral BID  . feeding supplement (GLUCERNA SHAKE)  237 mL Oral TID BM  . folic acid  1 mg Oral Daily  . furosemide  20 mg Intravenous Q12H  . heparin  5,000 Units Subcutaneous Q8H  . insulin aspart  0-15 Units Subcutaneous TID WC  . insulin aspart  0-5 Units Subcutaneous QHS  . insulin aspart protamine- aspart  15 Units Subcutaneous BID WC  . multivitamin with minerals  1 tablet Oral Daily  . pantoprazole (PROTONIX) IV  40 mg Intravenous Q12H  . temazepam  15 mg Oral QHS  . thiamine  100 mg Oral Daily   Or  . thiamine  100 mg Intravenous Daily   Continuous Infusions:   Assessment/Plan:  1. Abdominal pain with nausea vomiting.  Elevated lipase which could be consistent with a pancreatitis but CT scan did not show any inflammation around the pancreas.  Could also be gastroparesis.  Will advance diet to low-fat t. 2. Hand swelling some shortness of breath cough we will get a chest x-ray place patient on low-dose IV Lasix 3. Alcohol abuse put on CIWA protocol.  Patient must stop drinking alcohol. 4. Type 2 diabetes mellitus continue 70/30 insulin 15 units twice daily.  Hemoglobin A1c 10.7.  5. Essential hypertension on Norvasc 6. Hyperlipidemia unspecified on atorvastatin 7. Hypertriglyceridemia likely secondary to diabetes and alcohol abuse  Code Status:     Code Status Orders  (From admission, onward)        Start     Ordered   01/03/17 1730  Full code  Continuous     01/03/17 1729    Code Status History    Date Active Date Inactive Code Status Order ID Comments User Context   12/10/2016 12:01 12/12/2016 17:12 Full Code [id number]  Demetrios Loll, MD Inpatient   10/15/2016 23:50 10/17/2016 00:56 Full Code [id number]  Dustin Flock, MD Inpatient   04/21/2016 05:05 04/24/2016 20:31 Full Code [id number]   Saundra Shelling, MD Inpatient   12/31/2015 07:39 01/01/2016 20:08 Full Code [id number]  Saundra Shelling, MD Inpatient   12/10/2015 11:09 12/12/2015 18:48 Full Code [id number]  Demetrios Loll, MD Inpatient   10/29/2015 15:45 11/01/2015 16:23 Full Code [id number]  Theodoro Grist, MD Inpatient   02/17/2015 17:04 02/19/2015 18:04 Full Code [id number]  Nicholes Mango, MD Inpatient     Disposition Plan: To be determined based on clinical course  Time spent: 25 minutes  [place], [facility]

## 2017-01-06 NOTE — Plan of Care (Signed)
  Education: Knowledge of General Education information will improve 01/06/2017 0520 - Progressing by Zanylah Hardie, Lucille Passy, RN

## 2017-01-07 LAB — BASIC METABOLIC PANEL
ANION GAP: 10 (ref 5–15)
BUN: 13 mg/dL (ref 6–20)
CALCIUM: 8.3 mg/dL — AB (ref 8.9–10.3)
CO2: 25 mmol/L (ref 22–32)
Chloride: 101 mmol/L (ref 101–111)
Creatinine, Ser: 1.22 mg/dL — ABNORMAL HIGH (ref 0.44–1.00)
GFR calc Af Amer: 55 mL/min — ABNORMAL LOW (ref >60)
GFR, EST NON AFRICAN AMERICAN: 48 mL/min — AB (ref >60)
GLUCOSE: 283 mg/dL — AB (ref 65–99)
Potassium: 3.2 mmol/L — ABNORMAL LOW (ref 3.5–5.1)
SODIUM: 136 mmol/L (ref 135–145)

## 2017-01-07 LAB — GLUCOSE, CAPILLARY: Glucose-Capillary: 263 mg/dL — ABNORMAL HIGH (ref 65–99)

## 2017-01-07 MED ORDER — FLURAZEPAM HCL 15 MG PO CAPS: 15.0000 mg | ORAL_CAPSULE | Freq: Every evening | ORAL | 0 refills | Status: DC | PRN

## 2017-01-07 MED ORDER — DOXYCYCLINE HYCLATE 100 MG PO TABS: 100.0000 mg | ORAL_TABLET | Freq: Two times a day (BID) | ORAL | 0 refills | Status: AC

## 2017-01-07 MED ORDER — HYDROCODONE-ACETAMINOPHEN 5-325 MG PO TABS: 1.0000 | ORAL_TABLET | Freq: Four times a day (QID) | ORAL | 0 refills | Status: DC | PRN

## 2017-01-07 MED ORDER — HYDRALAZINE HCL 25 MG PO TABS: 25.0000 mg | ORAL_TABLET | Freq: Three times a day (TID) | ORAL | 0 refills | Status: DC

## 2017-01-07 MED ORDER — POTASSIUM CHLORIDE CRYS ER 20 MEQ PO TBCR
40.0000 meq | EXTENDED_RELEASE_TABLET | Freq: Once | ORAL | Status: AC
  Administered 2017-01-07: 40 meq via ORAL
  Filled 2017-01-07: qty 2

## 2017-01-07 MED ORDER — CYCLOBENZAPRINE HCL 5 MG PO TABS: 5.0000 mg | ORAL_TABLET | Freq: Two times a day (BID) | ORAL | 0 refills | Status: DC

## 2017-01-07 NOTE — Progress Notes (Signed)
Patient discharged home. Discharge instructions reviewed and questions answered. Patient verbalizes understanding of discharge instructions.

## 2017-01-07 NOTE — Progress Notes (Signed)
Inpatient Diabetes Program Recommendations  AACE/ADA: New Consensus Statement on Inpatient Glycemic Control (2015)  Target Ranges:  Prepandial:   less than 140 mg/dL      Peak postprandial:   less than 180 mg/dL (1-2 hours)      Critically ill patients:  140 - 180 mg/dL   Lab Results  Component Value Date   GLUCAP 263 (H) 01/07/2017   HGBA1C 10.7 (H) 01/03/2017    Review of Glycemic Control  Results for Karen [name], Karen [name] (MRN [id number]) as of 01/07/2017 08:10  Ref. Range 01/06/2017 07:31 01/06/2017 11:22 01/06/2017 17:14 01/06/2017 21:05 01/07/2017 07:33  Glucose-Capillary Latest Ref Range: 65 - 99 mg/dL 194 (H) 134 (H) 232 (H) 180 (H) 263 (H)    History: Type 2  Home DM Meds: 70/30 Insulin- 15 units BID  Current Insulin Orders: Novolin 70/30 15 units bid, Novolog 0-15 units tid, Novolog 0-5 units qhs  Note patient takes 70/30 Insulin at home and has been ordered here- more risk of hypoglycemia and poor hospital control with 70/30.   Recommend stopping 70/30 and starting Lantus 20 units daily at hs  (Patient gets about 21 units basal insulin through her 70/30 insulin divided into 2 doses at home) Can convert back to 70/30 insulin at time of discharge.  Gentry Fitz, RN, BA, MHA, CDE Diabetes Coordinator Inpatient Diabetes Program  [phone] (Team Pager) [phone] ([place]) 01/07/2017 8:22 AM

## 2017-01-07 NOTE — Discharge Instructions (Signed)
[facility] at [place]:  Diabetic diet, cardiac diet  DISCHARGE CONDITION:  Stable  ACTIVITY:  Activity as tolerated  OXYGEN:  Home Oxygen: No.   Oxygen Delivery: room air  DISCHARGE LOCATION:  home    ADDITIONAL DISCHARGE INSTRUCTION: stop driking   If you experience worsening of your admission symptoms, develop shortness of breath, life threatening emergency, suicidal or homicidal thoughts you must seek medical attention immediately by calling 911 or calling your MD immediately  if symptoms less severe.  You Must read complete instructions/literature along with all the possible adverse reactions/side effects for all the Medicines you take and that have been prescribed to you. Take any new Medicines after you have completely understood and accpet all the possible adverse reactions/side effects.   Please note  You were cared for by a hospitalist during your hospital stay. If you have any questions about your discharge medications or the care you received while you were in the hospital after you are discharged, you can call the unit and asked to speak with the hospitalist on call if the hospitalist that took care of you is not available. Once you are discharged, your primary care physician will handle any further medical issues. Please note that NO REFILLS for any discharge medications will be authorized once you are discharged, as it is imperative that you return to your primary care physician (or establish a relationship with a primary care physician if you do not have one) for your aftercare needs so that they can reassess your need for medications and monitor your lab values.

## 2017-01-07 NOTE — Discharge Summary (Signed)
[facility] - [place] at [facility], 62 y.o., DOB [birth year], MRN [id number]. Admission date: 01/03/2017 Discharge Date 01/07/2017 Primary MD Marden Noble, MD Admitting Physician Idelle Crouch, MD  Admission Diagnosis  Nonintractable episodic headache, unspecified headache type [R51] Acute pancreatitis without infection or necrosis, unspecified pancreatitis type [K85.90]  Discharge Diagnosis   Principal Problem:   Pancreatitis, acute    Poorly controlled type 2 diabetes mellitus ([place])   ETOH abuse Possible fluid overload Essential hypertension Hyper triglyceridemia     Hospital Course  Karen [name] is a 62 y.o. female has a past medical history significant for HTN, DM, and ETOH abuse now with acute onset abdominal pain with N/V and dysuria. In ER, CT unremarkable but Lipase elevated.  Patient has a history of alcohol abuse and was admitted for further treatment for her pancreatitis.  Her diet was slowly increased.  She continued to have vague symptoms.  Her lipase is normal.  She also complained of some swelling she did receive a lot of fluids.  Swelling resolved with IV Lasix.  She is doing much better and stable for discharge to home.              Consults  None  Significant Tests:  See full reports for all details     Ct Abdomen Pelvis Wo Contrast  Result Date: 12/10/2016 CLINICAL DATA:  62 year old female with nausea and vomiting. Coffee ground emesis. Right lower quadrant pain. EXAM: CT ABDOMEN AND PELVIS WITHOUT CONTRAST TECHNIQUE: Multidetector CT imaging of the abdomen and pelvis was performed following the standard protocol without IV contrast. COMPARISON:  CT Abdomen and Pelvis 10/15/2016 and earlier. FINDINGS: Lower chest: Increased since October streaky and interstitial appearing bilateral lower lobe opacity is nonspecific. No consolidation or pleural effusion. Lingula and right middle lobe remain clear. No pericardial effusion.  Hepatobiliary: Hepatic steatosis.  Negative noncontrast gallbladder. Pancreas: The pancreas appears stable in the absence of IV contrast. No pancreatic inflammation is evident. Spleen: Negative. Adrenals/Urinary Tract: Normal adrenal glands. Bilateral perinephric stranding is stable since October and remains nonspecific. No nephrolithiasis. Negative course of both ureters which are intermittently prominent. Distended urinary bladder similar to the appearance in October with an estimated bladder volume of 760 mL. Stomach/Bowel: Rectum is stable and negative. The sigmoid colon is redundant and gas-filled bilateral lower abdomen. Decompressed left colon with unchanged mild thickening of the left lateral Conal Foss show. Decompressed transverse colon with mild retained stool. Diverticulosis at the hepatic flexure and in the ascending colon but no active inflammation identified. Prior appendectomy. Negative cecum and terminal ileum. Gas-filled distal small bowel loops are nondilated. The stomach is decompressed. There is mild tortuosity of the duodenum which appears stable. The proximal small bowel loops are decompressed. But mid abdominal small bowel loops containing oral contrast are at the upper limits of normal to mildly dilated (28 mm diameter). There is no abrupt transition. No associated mesenteric stranding. No abdominal free fluid or free air. Vascular/Lymphatic: Vascular patency is not evaluated in the absence of IV contrast. Aortoiliac calcified atherosclerosis. No lymphadenopathy. Reproductive: Surgically absent uterus.  Stable, negative adnexa. Other: No pelvic free fluid. Musculoskeletal: No acute osseous abnormality identified. IMPRESSION: 1. Upper limits of normal to mildly dilated mid abdominal small bowel loops containing contrast, with a gradual transition distal small bowel. This most resembles a small bowel ileus. No associated bowel inflammation or free fluid. 2. Distended urinary bladder, 760 mL  bladder volume. Query bladder outlet obstruction.  3. Increased but nonspecific lower lobe pulmonary opacity since October. This may be atelectasis but mild or developing lower lobe infection is difficult to exclude. 4. No pancreatic inflammation is evident. No other acute findings in the abdomen or pelvis. 5. Hepatic steatosis.  Aortic Atherosclerosis (ICD10-I70.0). Electronically Signed   By: Genevie Ann M.D.   On: 12/10/2016 14:48   Dg Chest 2 View  Result Date: 01/06/2017 CLINICAL DATA:  Shortness of breath. EXAM: CHEST  2 VIEW COMPARISON:  Fall last 17. FINDINGS: Trachea is midline. Heart size normal. Question mild peribronchovascular thickening at the left lung base. No airspace consolidation. Tiny left pleural effusion. IMPRESSION: 1. Question peribronchovascular thickening at the left lung base, raising suspicion for an infectious bronchiolitis. 2. Trace left pleural effusion. Electronically Signed   By: Lorin Picket M.D.   On: 01/06/2017 09:45   Ct Head Wo Contrast  Result Date: 01/03/2017 CLINICAL DATA:  Headache. Diabetes. Hypertension. Elevated blood sugar. EXAM: CT HEAD WITHOUT CONTRAST TECHNIQUE: Contiguous axial images were obtained from the base of the skull through the vertex without intravenous contrast. COMPARISON:  04/21/2016 brain MR. 04/20/2016 CT. FINDINGS: Brain: Mild age advanced cerebral atrophy. No mass lesion, hemorrhage, hydrocephalus, acute infarct, intra-axial, or extra-axial fluid collection. Vascular: Intracranial atherosclerosis. Skull: Normal Sinuses/Orbits: Normal imaged portions of the orbits and globes. Clear paranasal sinuses and mastoid air cells. Other: None. IMPRESSION: 1.  No acute intracranial abnormality. 2. Cerebral atrophy. Electronically Signed   By: Abigail Miyamoto M.D.   On: 01/03/2017 14:32   Ct Abdomen Pelvis W Contrast  Result Date: 01/03/2017 CLINICAL DATA:  Right upper quadrant pain and dysuria. EXAM: CT ABDOMEN AND PELVIS WITH CONTRAST TECHNIQUE:  Multidetector CT imaging of the abdomen and pelvis was performed using the standard protocol following bolus administration of intravenous contrast. CONTRAST:  83mL ISOVUE-300 IOPAMIDOL (ISOVUE-300) INJECTION 61% COMPARISON:  12/10/2016 FINDINGS: Lower chest:  Unremarkable. Hepatobiliary: No focal abnormality within the liver parenchyma. There is no evidence for gallstones, gallbladder wall thickening, or pericholecystic fluid. 7 mm diameter common bile duct is stable in the interval. Pancreas: No focal mass lesion. No dilatation of the main duct. No intraparenchymal cyst. No peripancreatic edema. Spleen: No splenomegaly. No focal mass lesion. Adrenals/Urinary Tract: No adrenal nodule or mass. 2 mm nonobstructing stone identified upper pole right kidney. Left kidney unremarkable. No evidence for hydroureter. Circumferential bladder wall thickening and trabeculation noted. Stomach/Bowel: Tiny hiatal hernia. Stomach is nondistended. No gastric wall thickening. No evidence of outlet obstruction. Duodenum is normally positioned as is the ligament of Treitz. No small bowel wall thickening. No small bowel dilatation. The terminal ileum is normal. The appendix is not visualized, but there is no edema or inflammation in the region of the cecum. Diverticuli are seen scattered along the entire length of the colon without CT findings of diverticulitis. Vascular/Lymphatic: There is abdominal aortic atherosclerosis without aneurysm. There is no gastrohepatic or hepatoduodenal ligament lymphadenopathy. No intraperitoneal or retroperitoneal lymphadenopathy. No pelvic sidewall lymphadenopathy. Reproductive: Uterus surgically absent.  There is no adnexal mass. Other: No intraperitoneal free fluid. Musculoskeletal: Bone windows reveal no worrisome lytic or sclerotic osseous lesions. IMPRESSION: 1. No acute findings in the abdomen or pelvis. 2. 2 mm nonobstructing stone upper pole right kidney. 3. Circumferential bladder wall  thickening bladder infection/inflammation could have this appearance. 4.  Aortic Atherosclerois (ICD10-170.0) Electronically Signed   By: Misty Stanley M.D.   On: 01/03/2017 14:33   Dg Abd 2 Views  Result Date: 12/10/2016 CLINICAL DATA:  Abdominal pain.  Vomiting and diarrhea. EXAM: ABDOMEN - 2 VIEW COMPARISON:  CT 10/15/2016 . FINDINGS: Soft tissue structures are unremarkable. Slightly prominent air-filled loops of small bowel noted. Colonic gas pattern normal. Partial small-bowel obstruction cannot be completely excluded. Follow-up exam suggested.No free air. Pelvic calcifications consistent phleboliths . IMPRESSION: Slightly prominent air-filled loops of small bowel noted. Partial small-bowel obstruction cannot be excluded. Follow-up exam suggested. Electronically Signed   By: Marcello Moores  Register   On: 12/10/2016 09:18   US Abdomen Limited Ruq  Result Date: 01/04/2017 CLINICAL DATA:  Upper abdominal pain.  History of pancreatitis EXAM: ULTRASOUND ABDOMEN LIMITED RIGHT UPPER QUADRANT COMPARISON:  CT abdomen and pelvis January 03, 2017. FINDINGS: Gallbladder: No gallstones or wall thickening visualized. There is no pericholecystic fluid. No sonographic Murphy sign noted by sonographer. Common bile duct: Diameter: 9 mm, prominent. No intrahepatic duct dilatation seen. No biliary duct mass or calculus evident. Liver: No focal lesion identified. Liver echogenicity is increased. Portal vein is patent on color Doppler imaging with normal direction of blood flow towards the liver. No evident ascites. Visualized portions of pancreas appear unremarkable. IMPRESSION: 1. Prominence of the common bile duct without mass or calculus evident by ultrasound. No gallbladder pathology evident. 2. Increase in liver echogenicity, a finding felt to be indicative of a degree of hepatic steatosis. While no focal liver lesions are evident on this study, it must be cautioned that sensitivity of ultrasound for detection of focal  liver lesions is diminished in this circumstance. Electronically Signed   By: Lowella Grip III M.D.   On: 01/04/2017 09:35   US Abdomen Limited Ruq  Result Date: 12/10/2016 CLINICAL DATA:  Two days of right upper quadrant pain. EXAM: ULTRASOUND ABDOMEN LIMITED RIGHT UPPER QUADRANT COMPARISON:  Abdominopelvic CT scan of October 15, 2016 FINDINGS: Gallbladder: No gallstones or wall thickening visualized. No sonographic Murphy sign noted by sonographer. Common bile duct: Diameter: 2.3 mm Liver: The hepatic echotexture is mildly increased diffusely. There is no focal mass or ductal dilation. The surface contour of the liver is normal. Portal vein is patent on color Doppler imaging with normal direction of blood flow towards the liver. IMPRESSION: Mildly increased hepatic echotexture most compatible with fatty infiltrative change. Normal appearance of the gallbladder and common bile duct. Electronically Signed   By: David  Martinique M.D.   On: 12/10/2016 08:46       Today   Subjective:   Aella Ronda feels better denies any symptoms currently Objective:   Blood pressure 131/73, pulse 90, temperature 98 F (36.7 C), temperature source Oral, resp. rate 18, height 5\' 1"  (1.549 m), weight 121 lb 12.8 oz (55.2 kg), SpO2 100 %.  .  Intake/Output Summary (Last 24 hours) at 01/07/2017 1501 Last data filed at 01/07/2017 0900 Gross per 24 hour  Intake 660 ml  Output -  Net 660 ml    Exam VITAL SIGNS: Blood pressure 131/73, pulse 90, temperature 98 F (36.7 C), temperature source Oral, resp. rate 18, height 5\' 1"  (1.549 m), weight 121 lb 12.8 oz (55.2 kg), SpO2 100 %.  GENERAL:  62 y.o.-year-old patient lying in the bed with no acute distress.  EYES: Pupils equal, round, reactive to light and accommodation. No scleral icterus. Extraocular muscles intact.  HEENT: Head atraumatic, normocephalic. Oropharynx and nasopharynx clear.  NECK:  Supple, no jugular venous distention. No thyroid enlargement, no  tenderness.  LUNGS: Normal breath sounds bilaterally, no wheezing, rales,rhonchi or crepitation. No use of accessory muscles of respiration.  CARDIOVASCULAR: S1, S2  normal. No murmurs, rubs, or gallops.  ABDOMEN: Soft, nontender, nondistended. Bowel sounds present. No organomegaly or mass.  EXTREMITIES: No pedal edema, cyanosis, or clubbing.  NEUROLOGIC: Cranial nerves II through XII are intact. Muscle strength 5/5 in all extremities. Sensation intact. Gait not checked.  PSYCHIATRIC: The patient is alert and oriented x 3.  SKIN: No obvious rash, lesion, or ulcer.   Data Review     CBC w Diff:  Lab Results  Component Value Date   WBC 4.5 01/04/2017   HGB 11.2 (L) 01/04/2017   HGB 15.1 08/11/2013   HCT 33.4 (L) 01/04/2017   HCT 45.9 08/11/2013   PLT 155 01/04/2017   PLT 162 08/11/2013   LYMPHOPCT 30 01/03/2017   LYMPHOPCT 5.5 09/02/2011   MONOPCT 12 01/03/2017   MONOPCT 12.4 09/02/2011   EOSPCT 1 01/03/2017   EOSPCT 0.0 09/02/2011   BASOPCT 0 01/03/2017   BASOPCT 0.4 09/02/2011   CMP:  Lab Results  Component Value Date   NA 136 01/07/2017   NA 132 (L) 08/11/2013   K 3.2 (L) 01/07/2017   K 3.8 08/11/2013   CL 101 01/07/2017   CL 94 (L) 08/11/2013   CO2 25 01/07/2017   CO2 21 08/11/2013   BUN 13 01/07/2017   BUN 20 (H) 08/11/2013   CREATININE 1.22 (H) 01/07/2017   CREATININE 0.84 08/11/2013   PROT 6.1 (L) 01/05/2017   PROT 8.9 (H) 08/11/2013   ALBUMIN 2.8 (L) 01/05/2017   ALBUMIN 3.9 08/11/2013   BILITOT 0.7 01/05/2017   BILITOT 0.9 08/11/2013   ALKPHOS 172 (H) 01/05/2017   ALKPHOS 140 (H) 08/11/2013   AST 32 01/05/2017   AST 65 (H) 08/11/2013   ALT 25 01/05/2017   ALT 108 (H) 08/11/2013  .  Micro Results No results found for this or any previous visit (from the past 240 hour(s)).      Code Status Orders  (From admission, onward)        Start     Ordered   01/03/17 1730  Full code  Continuous     01/03/17 1729    Code Status History    Date  Active Date Inactive Code Status Order ID Comments User Context   12/10/2016 12:01 12/12/2016 17:12 Full Code [id number]  Demetrios Loll, MD Inpatient   10/15/2016 23:50 10/17/2016 00:56 Full Code [id number]  Dustin Flock, MD Inpatient   04/21/2016 05:05 04/24/2016 20:31 Full Code [id number]  Saundra Shelling, MD Inpatient   12/31/2015 07:39 01/01/2016 20:08 Full Code [id number]  Saundra Shelling, MD Inpatient   12/10/2015 11:09 12/12/2015 18:48 Full Code [id number]  Demetrios Loll, MD Inpatient   10/29/2015 15:45 11/01/2015 16:23 Full Code [id number]  Theodoro Grist, MD Inpatient   02/17/2015 17:04 02/19/2015 18:04 Full Code [id number]  Nicholes Mango, [place], [place] Follow up on 01/13/2017.   Specialty:  General Practice Why:  @ 1:00 pm Contact information: [address] [place] [address] [phone]           Discharge Medications   Allergies as of 01/07/2017      Reactions   Penicillins Anaphylaxis   Has patient had a PCN reaction causing immediate rash, facial/tongue/throat swelling, SOB or lightheadedness with hypotension: Yes Has patient had a PCN reaction causing severe rash involving mucus membranes or skin necrosis: No Has patient had a PCN reaction that required hospitalization No Has patient had  a PCN reaction occurring within the last 10 years: No If all of the above answers are "NO", then may proceed with Cephalosporin use.   Reglan [metoclopramide] Other (See Comments)   Hypotension, shortness of breath   Percocet [oxycodone-acetaminophen] Rash      Medication List    STOP taking these medications   ciprofloxacin 500 MG tablet Commonly known as:  CIPRO     TAKE these medications   amLODipine 5 MG tablet Commonly known as:  NORVASC Take 5 mg by mouth daily.   atorvastatin 40 MG tablet Commonly known as:  LIPITOR Take 1 tablet (40 mg total) by mouth daily at 6 PM.   cyclobenzaprine 5  MG tablet Commonly known as:  FLEXERIL Take 1 tablet (5 mg total) by mouth 2 (two) times daily.   dicyclomine 20 MG tablet Commonly known as:  BENTYL Take 1 tablet (20 mg total) by mouth 3 (three) times daily as needed for spasms.   diphenhydrAMINE 25 MG tablet Commonly known as:  BENADRYL Take 25 mg by mouth every 6 (six) hours as needed.   doxycycline 100 MG tablet Commonly known as:  VIBRA-TABS Take 1 tablet (100 mg total) by mouth every 12 (twelve) hours for 5 days.   flurazepam 15 MG capsule Commonly known as:  DALMANE Take 1 capsule (15 mg total) by mouth at bedtime as needed for sleep.   hydrALAZINE 25 MG tablet Commonly known as:  APRESOLINE Take 1 tablet (25 mg total) by mouth every 8 (eight) hours.   HYDROcodone-acetaminophen 5-325 MG tablet Commonly known as:  NORCO Take 1 tablet by mouth every 6 (six) hours as needed for up to 15 doses for severe pain.   NOVOLOG MIX 70/30 FLEXPEN (70-30) 100 UNIT/ML FlexPen Generic drug:  insulin aspart protamine - aspart Inject 15 Units into the skin 2 (two) times daily.   ondansetron 4 MG disintegrating tablet Commonly known as:  ZOFRAN ODT Take 1 tablet (4 mg total) by mouth every 8 (eight) hours as needed for nausea or vomiting.          Total Time in preparing paper work, data evaluation and todays exam - 35 minutes  Dustin Flock M.D on 01/07/2017 at 3:01 [facility]  [facility]   Office  [phone]

## 2017-01-29 ENCOUNTER — Encounter: Payer: Self-pay | Admitting: Emergency Medicine

## 2017-01-29 ENCOUNTER — Emergency Department
Admission: EM | Admit: 2017-01-29 | Discharge: 2017-01-29 | Disposition: A | Discharge status: Home / Self Care | Payer: Medicaid Other | Attending: Emergency Medicine | Admitting: Emergency Medicine

## 2017-01-29 ENCOUNTER — Emergency Department: Payer: Medicaid Other

## 2017-01-29 ENCOUNTER — Other Ambulatory Visit: Payer: Self-pay

## 2017-01-29 DIAGNOSIS — Z87891 Personal history of nicotine dependence: Secondary | ICD-10-CM | POA: Diagnosis not present

## 2017-01-29 DIAGNOSIS — N390 Urinary tract infection, site not specified: Secondary | ICD-10-CM

## 2017-01-29 DIAGNOSIS — E119 Type 2 diabetes mellitus without complications: Secondary | ICD-10-CM | POA: Insufficient documentation

## 2017-01-29 DIAGNOSIS — I10 Essential (primary) hypertension: Secondary | ICD-10-CM | POA: Insufficient documentation

## 2017-01-29 DIAGNOSIS — K860 Alcohol-induced chronic pancreatitis: Secondary | ICD-10-CM

## 2017-01-29 DIAGNOSIS — Z79899 Other long term (current) drug therapy: Secondary | ICD-10-CM | POA: Diagnosis not present

## 2017-01-29 DIAGNOSIS — Z794 Long term (current) use of insulin: Secondary | ICD-10-CM | POA: Diagnosis not present

## 2017-01-29 DIAGNOSIS — R1010 Upper abdominal pain, unspecified: Secondary | ICD-10-CM | POA: Diagnosis present

## 2017-01-29 DIAGNOSIS — J45909 Unspecified asthma, uncomplicated: Secondary | ICD-10-CM | POA: Insufficient documentation

## 2017-01-29 DIAGNOSIS — F10929 Alcohol use, unspecified with intoxication, unspecified: Secondary | ICD-10-CM

## 2017-01-29 LAB — URINALYSIS, COMPLETE (UACMP) WITH MICROSCOPIC
BILIRUBIN URINE: NEGATIVE
Glucose, UA: >=500 mg/dL — AB
Ketones, ur: NEGATIVE mg/dL
NITRITE: NEGATIVE
Protein, ur: 30 mg/dL — AB
Specific Gravity, Urine: 1.002 — ABNORMAL LOW (ref 1.005–1.030)
pH: 6 (ref 5.0–8.0)

## 2017-01-29 LAB — COMPREHENSIVE METABOLIC PANEL
ALT: 34 U/L (ref 14–54)
ANION GAP: 12 (ref 5–15)
AST: 34 U/L (ref 15–41)
Albumin: 4 g/dL (ref 3.5–5.0)
Alkaline Phosphatase: 210 U/L — ABNORMAL HIGH (ref 38–126)
BUN: 26 mg/dL — ABNORMAL HIGH (ref 6–20)
CHLORIDE: 100 mmol/L — AB (ref 101–111)
CO2: 22 mmol/L (ref 22–32)
Calcium: 9.3 mg/dL (ref 8.9–10.3)
Creatinine, Ser: 1.03 mg/dL — ABNORMAL HIGH (ref 0.44–1.00)
GFR calc Af Amer: >60 mL/min (ref >60)
GFR calc non Af Amer: 58 mL/min — ABNORMAL LOW (ref >60)
GLUCOSE: 374 mg/dL — AB (ref 65–99)
POTASSIUM: 4.1 mmol/L (ref 3.5–5.1)
SODIUM: 134 mmol/L — AB (ref 135–145)
Total Bilirubin: 0.6 mg/dL (ref 0.3–1.2)
Total Protein: 8.3 g/dL — ABNORMAL HIGH (ref 6.5–8.1)

## 2017-01-29 LAB — CBC
HCT: 37.8 % (ref 35.0–47.0)
Hemoglobin: 12.7 g/dL (ref 12.0–16.0)
MCH: 30.6 pg (ref 26.0–34.0)
MCHC: 33.5 g/dL (ref 32.0–36.0)
MCV: 91.4 fL (ref 80.0–100.0)
PLATELETS: 211 10*3/uL (ref 150–440)
RBC: 4.13 MIL/uL (ref 3.80–5.20)
RDW: 12.4 % (ref 11.5–14.5)
WBC: 6.4 10*3/uL (ref 3.6–11.0)

## 2017-01-29 LAB — GLUCOSE, CAPILLARY
Glucose-Capillary: 46 mg/dL — ABNORMAL LOW (ref 65–99)
Glucose-Capillary: 50 mg/dL — ABNORMAL LOW (ref 65–99)
Glucose-Capillary: 99 mg/dL (ref 65–99)

## 2017-01-29 LAB — LIPASE, BLOOD: Lipase: 108 U/L — ABNORMAL HIGH (ref 11–51)

## 2017-01-29 LAB — TROPONIN I: Troponin I: <0.03 ng/mL (ref <0.03)

## 2017-01-29 MED ORDER — MORPHINE SULFATE (PF) 2 MG/ML IV SOLN
2.0000 mg | Freq: Once | INTRAVENOUS | Status: AC
  Administered 2017-01-29: 2 mg via INTRAVENOUS

## 2017-01-29 MED ORDER — MORPHINE SULFATE (PF) 4 MG/ML IV SOLN
6.0000 mg | Freq: Once | INTRAVENOUS | Status: AC
  Administered 2017-01-29: 6 mg via INTRAVENOUS
  Filled 2017-01-29: qty 2

## 2017-01-29 MED ORDER — ONDANSETRON 4 MG PO TBDP: 4.0000 mg | ORAL_TABLET | Freq: Four times a day (QID) | ORAL | 0 refills | Status: DC | PRN

## 2017-01-29 MED ORDER — ONDANSETRON HCL 4 MG/2ML IJ SOLN
4.0000 mg | Freq: Once | INTRAMUSCULAR | Status: AC
Start: 2017-01-29 — End: 2017-01-29
  Administered 2017-01-29: 4 mg via INTRAVENOUS
  Filled 2017-01-29: qty 2

## 2017-01-29 MED ORDER — MORPHINE SULFATE (PF) 2 MG/ML IV SOLN
2.0000 mg | Freq: Once | INTRAVENOUS | Status: AC
  Administered 2017-01-29: 2 mg via INTRAVENOUS
  Filled 2017-01-29: qty 1

## 2017-01-29 MED ORDER — MORPHINE SULFATE (PF) 2 MG/ML IV SOLN
INTRAVENOUS | Status: AC
  Administered 2017-01-29: 2 mg via INTRAVENOUS
  Filled 2017-01-29: qty 1

## 2017-01-29 MED ORDER — HYDROCODONE-ACETAMINOPHEN 5-325 MG PO TABS
1.0000 | ORAL_TABLET | Freq: Once | ORAL | Status: AC
  Administered 2017-01-29: 1 via ORAL
  Filled 2017-01-29: qty 1

## 2017-01-29 MED ORDER — CIPROFLOXACIN HCL 500 MG PO TABS: 500.0000 mg | ORAL_TABLET | Freq: Two times a day (BID) | ORAL | 0 refills | Status: AC

## 2017-01-29 MED ORDER — IOPAMIDOL (ISOVUE-300) INJECTION 61%
75.0000 mL | Freq: Once | INTRAVENOUS | Status: AC | PRN
  Administered 2017-01-29: 75 mL via INTRAVENOUS

## 2017-01-29 MED ORDER — CIPROFLOXACIN IN D5W 400 MG/200ML IV SOLN
400.0000 mg | Freq: Once | INTRAVENOUS | Status: AC
  Administered 2017-01-29: 400 mg via INTRAVENOUS
  Filled 2017-01-29: qty 200

## 2017-01-29 MED ORDER — IOPAMIDOL (ISOVUE-300) INJECTION 61%
30.0000 mL | Freq: Once | INTRAVENOUS | Status: AC | PRN
  Administered 2017-01-29: 30 mL via ORAL

## 2017-01-29 MED ORDER — ONDANSETRON HCL 4 MG/2ML IJ SOLN
4.0000 mg | Freq: Once | INTRAMUSCULAR | Status: AC
  Administered 2017-01-29: 4 mg via INTRAVENOUS

## 2017-01-29 MED ORDER — SODIUM CHLORIDE 0.9 % IV BOLUS (SEPSIS): 500.0000 mL | Freq: Once | INTRAVENOUS | Status: DC

## 2017-01-29 MED ORDER — SODIUM CHLORIDE 0.9 % IV BOLUS (SEPSIS)
1000.0000 mL | Freq: Once | INTRAVENOUS | Status: AC
  Administered 2017-01-29: 1000 mL via INTRAVENOUS

## 2017-01-29 MED ORDER — INSULIN ASPART 100 UNIT/ML ~~LOC~~ SOLN
8.0000 [IU] | Freq: Once | SUBCUTANEOUS | Status: AC
  Administered 2017-01-29: 8 [IU] via INTRAVENOUS
  Filled 2017-01-29: qty 1

## 2017-01-29 MED ORDER — ONDANSETRON HCL 4 MG/2ML IJ SOLN
INTRAMUSCULAR | Status: AC
  Filled 2017-01-29: qty 2

## 2017-01-29 NOTE — ED Triage Notes (Signed)
PT to ED via EMS, EMS reports that pts mother passed away yesterday, she drank a few beers and then developed abd pain, headache and nausea.

## 2017-01-29 NOTE — Progress Notes (Signed)
Inpatient Diabetes Program Recommendations  AACE/ADA: New Consensus Statement on Inpatient Glycemic Control (2015)  Target Ranges:  Prepandial:   less than 140 mg/dL      Peak postprandial:   less than 180 mg/dL (1-2 hours)      Critically ill patients:  140 - 180 mg/dL   Lab Results  Component Value Date   GLUCAP 50 (L) 01/29/2017   HGBA1C 10.7 (H) 01/03/2017    Review of Glycemic Control  Results for Karen [name], Karen [name] (MRN [id number]) as of 01/29/2017 13:32  Ref. Range 01/29/2017 13:00 01/29/2017 13:03  Glucose-Capillary Latest Ref Range: 65 - 99 mg/dL 46 (L) 50 (L)   History:Type 2  Home DM Meds:Novolog 70/30 Insulin 15 units BID per medication record (Patient gets about 21 units basal insulin through her 70/30 insulin divided into 2 doses at home)  If the patient is admitted to the hospital- recommend the use of Levemir bid and Novolog 0-9 units tid- not 70/30 insulin  -more risk of hypoglycemia and poor hospital control with 70/30.  Gentry Fitz, RN, BA, MHA, CDE Diabetes Coordinator Inpatient Diabetes Program  [phone] (Team Pager) [phone] ([place]) 01/29/2017 1:36 PM

## 2017-01-29 NOTE — Discharge Instructions (Signed)
You have been seen in the Emergency Department (ED) today for pain when urinating.  Your workup today suggests that you have a urinary tract infection (UTI). STOP use of all alcohol, beer, or wine. This is causing your pancreatitis and abdominal pain.   Please take your antibiotic as prescribed and over-the-counter pain medication (Tylenol or Motrin) as needed, but no more than recommended on the label instructions.  Drink PLENTY of fluids.  Call your regular doctor to schedule the next available appointment to follow up on today?s ED visit, or return immediately to the ED if your pain worsens, you have decreased urine production, develop fever, persistent vomiting, or other symptoms that concern you.

## 2017-01-29 NOTE — ED Provider Notes (Signed)
[facility] Emergency Department Provider Note   ____________________________________________   None    (approximate)  I have reviewed the triage vital signs and the nursing notes.   HISTORY  Chief Complaint Abdominal Pain; Headache; and Nausea  Patient offered interpreter, did not wish for 1  HPI Karen [name] is a 62 y.o. female reports that she made breakfast this morning, began experiencing nausea vomiting and severe 10 out of 10 upper abdominal pain at that time after eating.  She reports that she did drink 2-3 beers this morning.  She feels like this feels like "pancreatitis".  Denies fevers or chills.  Reports she was in her normal health yesterday but her mother died recently.  Denies chest pain or trouble breathing.  She reports all the vomiting makes her head also feel achy, but her pain is severe primarily in her upper abdomen.  Some pain over the right flank as well.  Denies pain or burning with urination.  Previous hysterectomy.  Did not take any pain medication or nausea medicine at home.  EMS reports that patient had notable abdominal pain on exam  Past Medical History:  Diagnosis Date  . Alcoholic pancreatitis   . Asthma   . Depression   . Diabetes mellitus without complication ([place])   . DKA (diabetic ketoacidoses) ([place]) 02/17/2015  . Emphysematous cystitis   . Hypercholesteremia   . Hypertension   . Hypokalemia     Patient Active Problem List   Diagnosis Date Noted  . Pancreatitis, acute 01/03/2017  . ETOH abuse 01/03/2017  . UTI (urinary tract infection) 01/03/2017  . Nausea and vomiting   . Esophageal candidiasis ([place])   . Intractable vomiting with nausea   . Hypertensive urgency 04/21/2016  . Abdominal pain   . Acute renal failure ([place])   . Nausea vomiting and diarrhea   . Smoker   . Poorly controlled type 2 diabetes mellitus ([place])   . Acute renal insufficiency 10/29/2015  . Elevated troponin 10/29/2015  . Urinary  tract infection without hematuria 10/29/2015  . Left flank pain 10/29/2015  . Malignant essential hypertension 10/29/2015  . DKA (diabetic ketoacidoses) ([place]) 02/17/2015  . Emphysematous cystitis     Past Surgical History:  Procedure Laterality Date  . ABDOMINAL HYSTERECTOMY  1996  . APPENDECTOMY  1997  . ESOPHAGOGASTRODUODENOSCOPY (EGD) WITH PROPOFOL N/A 04/24/2016   Procedure: ESOPHAGOGASTRODUODENOSCOPY (EGD) WITH PROPOFOL;  Surgeon: [name], MD;  Location: [facility] ENDOSCOPY;  Service: Endoscopy;  Laterality: N/A;  . HAND SURGERY  1988  . THYROID SURGERY  2013    Prior to Admission medications   Medication Sig Start Date End Date Taking? Authorizing Provider  amLODipine (NORVASC) 5 MG tablet Take 5 mg by mouth daily.   Yes [provider]  atorvastatin (LIPITOR) 40 MG tablet Take 1 tablet (40 mg total) by mouth daily at 6 PM. 12/12/16  Yes Demetrios Loll, MD  cyclobenzaprine (FLEXERIL) 5 MG tablet Take 1 tablet (5 mg total) by mouth 2 (two) times daily. 01/07/17  Yes Dustin Flock, MD  hydrALAZINE (APRESOLINE) 25 MG tablet Take 1 tablet (25 mg total) by mouth every 8 (eight) hours. 01/07/17  Yes Dustin Flock, MD  insulin aspart protamine - aspart (NOVOLOG MIX 70/30 FLEXPEN) (70-30) 100 UNIT/ML FlexPen Inject 15 Units into the skin 2 (two) times daily.   Yes [provider]  dicyclomine (BENTYL) 20 MG tablet Take 1 tablet (20 mg total) by mouth 3 (three) times daily as needed for spasms.  Patient not taking: Reported on 01/03/2017 06/11/16   Carrie Mew, MD  diphenhydrAMINE (BENADRYL) 25 MG tablet Take 25 mg by mouth every 6 (six) hours as needed.    [provider]  flurazepam (DALMANE) 15 MG capsule Take 1 capsule (15 mg total) by mouth at bedtime as needed for sleep. 01/07/17   Dustin Flock, MD  HYDROcodone-acetaminophen (NORCO) 5-325 MG tablet Take 1 tablet by mouth every 6 (six) hours as needed for up to 15 doses for severe pain. 01/07/17   Dustin Flock,  MD  ondansetron (ZOFRAN ODT) 4 MG disintegrating tablet Take 1 tablet (4 mg total) by mouth every 8 (eight) hours as needed for nausea or vomiting. Patient not taking: Reported on 01/03/2017 10/16/16   Henreitta Leber, MD  GLUCERNA [facility]) LIQD Take 237 mLs by mouth 3 (three) times daily between meals. 02/19/15 02/27/15  Bettey Costa, MD    Allergies Penicillins; Reglan [metoclopramide]; and Percocet [oxycodone-acetaminophen]  Family History  Family history unknown: Yes    Social History Social History   Tobacco Use  . Smoking status: Former Research scientist (life sciences)  . Smokeless tobacco: Never Used  Substance Use Topics  . Alcohol use: Yes    Comment: 40oz/day  . Drug use: No    Review of Systems Constitutional: No fever/chills Eyes: No visual changes. ENT: No sore throat. Cardiovascular: Denies chest pain. Respiratory: Denies shortness of breath. Gastrointestinal:  No diarrhea.  No constipation. Genitourinary: Negative for dysuria. Musculoskeletal: Negative for back pain. Skin: Negative for rash. Neurological: Negative for weakness or numbness.    ____________________________________________   PHYSICAL EXAM:  VITAL SIGNS: ED Triage Vitals  Enc Vitals Group     BP 01/29/17 0814 (!) 149/85     Pulse Rate 01/29/17 0814 86     Resp 01/29/17 0814 20     Temp 01/29/17 0821 97.9 F (36.6 C)     Temp Source 01/29/17 0821 Oral     SpO2 01/29/17 0814 97 %     Weight 01/29/17 0820 115 lb (52.2 kg)     Height 01/29/17 0820 4\' 11"  (1.499 m)     Head Circumference --      Peak Flow --      Pain Score 01/29/17 0820 10     Pain Loc --      Pain Edu? --      Excl. in [place]? --     Constitutional: Alert and oriented. Well appearing overall, but appears in pain, holding her hands across her lower abdomen. Eyes: Conjunctivae are injected. Head: Atraumatic. Nose: No congestion/rhinnorhea. Mouth/Throat: Mucous membranes are slightly dry. Neck: No stridor.   Cardiovascular: Normal rate,  regular rhythm. Grossly normal heart sounds.  Good peripheral circulation. Respiratory: Normal respiratory effort.  No retractions. Lungs CTAB. Gastrointestinal: Soft and moderately tender across the epigastrium and right flank.  No pain or tenderness over the left side the abdomen.  No distention.  No active emesis, but she does appear nauseated.  Reports some mild tenderness to percussion across the epigastrium and some right flank.. No distention. Musculoskeletal: No lower extremity tenderness nor edema. Neurologic:  Normal speech and language. No gross focal neurologic deficits are appreciated.  Skin:  Skin is warm, dry and intact. No rash noted. Psychiatric: Mood and affect are normal. Speech and behavior are normal.  ____________________________________________   LABS (all labs ordered are listed, but only abnormal results are displayed)  Labs Reviewed  COMPREHENSIVE METABOLIC PANEL - Abnormal; Notable for the following components:  Result Value   Sodium 134 (*)    Chloride 100 (*)    Glucose, Bld 374 (*)    BUN 26 (*)    Creatinine, Ser 1.03 (*)    Total Protein 8.3 (*)    Alkaline Phosphatase 210 (*)    GFR calc non Af Amer 58 (*)    All other components within normal limits  LIPASE, BLOOD - Abnormal; Notable for the following components:   Lipase 108 (*)    All other components within normal limits  URINALYSIS, COMPLETE (UACMP) WITH MICROSCOPIC - Abnormal; Notable for the following components:   Color, Urine STRAW (*)    APPearance CLEAR (*)    Specific Gravity, Urine 1.002 (*)    Glucose, UA >=500 (*)    Hgb urine dipstick SMALL (*)    Protein, ur 30 (*)    Leukocytes, UA MODERATE (*)    Bacteria, UA RARE (*)    Squamous Epithelial / LPF 0-5 (*)    All other components within normal limits  GLUCOSE, CAPILLARY - Abnormal; Notable for the following components:   Glucose-Capillary 46 (*)    All other components within normal limits  URINE CULTURE  CBC  TROPONIN  I  GLUCOSE, CAPILLARY  CBG MONITORING, ED  CBG MONITORING, ED  CBG MONITORING, ED  CBG MONITORING, ED  CBG MONITORING, ED  CBG MONITORING, ED   ____________________________________________  EKG  Reviewed by me at 8:30 AM Heart rate 85 QRS 90 QTC 450 Normal sinus rhythm, no evidence of ischemia or ectopy ____________________________________________  RADIOLOGY  Ct Abdomen Pelvis W Contrast  Result Date: 01/29/2017 CLINICAL DATA:  Nausea and vomiting this morning, history pancreatitis EXAM: CT ABDOMEN AND PELVIS WITH CONTRAST TECHNIQUE: Multidetector CT imaging of the abdomen and pelvis was performed using the standard protocol following bolus administration of intravenous contrast. Sagittal and coronal MPR images reconstructed from axial data set. CONTRAST:  76mL ISOVUE-300 IOPAMIDOL (ISOVUE-300) INJECTION 61% IV. Dilute oral contrast. COMPARISON:  01/03/2017 FINDINGS: Lower chest: Mild bibasilar atelectasis Hepatobiliary: Vague lesion at posterior aspect RIGHT lobe liver superiorly 11 x 8 mm diameter image 11 unchanged since an earlier study of 04/21/2016. Gallbladder and liver otherwise normal appearance. No biliary dilatation. Pancreas: Normal appearance Spleen: Normal appearance Adrenals/Urinary Tract: Adrenal glands normal appearance. Tiny nonobstructing upper pole RIGHT renal calculus. Kidneys, ureters, and well distended bladder otherwise normal appearance. Stomach/Bowel: Appendix surgically absent. Scattered diverticula at ascending colon. Stomach and bowel loops otherwise normal appearance. Vascular/Lymphatic: Atherosclerotic calcifications aorta without aneurysm. No adenopathy. Scattered pelvic phleboliths. Reproductive: Uterus surgically absent.  Normal sized ovaries. Other: No free air, free fluid, hernia or acute inflammatory process Musculoskeletal: No acute osseous findings. IMPRESSION: No acute intra-abdominal or intrapelvic abnormalities. Stable nonspecific 11 x 8 mm lesion in  RIGHT lobe liver. Minimal colonic diverticulosis. Tiny nonobstructing LEFT renal calculus. Aortic Atherosclerosis (ICD10-I70.0). Electronically Signed   By: Lavonia Dana M.D.   On: 01/29/2017 12:35     CT scan results reviewed by me, reviewed by me, no acute intra-abdominal findings.  Ongoing nonspecific right liver lobe lesion.  Of note there is no stranding, no evidence of appendicitis, no evidence of pyelonephritis.  No peripancreatic inflammation is noted. ____________________________________________   PROCEDURES  Procedure(s) performed: None  Procedures  Critical Care performed: No  ____________________________________________   INITIAL IMPRESSION / ASSESSMENT AND PLAN / ED COURSE  Pertinent labs & imaging results that were available during my care of the patient were reviewed by me and considered in my medical  decision making (see chart for details).  Differential diagnosis includes but is not limited to, abdominal perforation, aortic dissection, cholecystitis, appendicitis, diverticulitis, colitis, esophagitis/gastritis, DKA, kidney stone, pyelonephritis, urinary tract infection, aortic aneurysm. All are considered in decision and treatment plan. Based upon the patient's presentation and risk factors, plan to proceed with CT scan to further evaluate for etiology of abdominal pain I suspect this is likely due to mild pancreatitis.  No active emesis in the ER.  He does carry a strong history of pancreatitis, continues to use alcohol unfortunately which I did counsel her on at the bedside.   Clinical Course as of Jan 29 1446  Fri Jan 29, 2017  1113 Patient reports improving, tolerating contrast thus far.  She is requesting a Vicodin tablet, will provide.  [MQ]  1327 Patient resting comfortably.  Wakes up, reports she feels much better.  Alert and oriented, agreeable with plan for observation and recheck her blood sugar.  If improved, plan to discharge her to home with prescription  for Cipro.  Notified her of diagnosis of urinary tract infection with mild pancreatitis.  [MQ]    Clinical Course User Index [MQ] Delman Kitten, MD    ----------------------------------------- 12:56 PM on 01/29/2017 -----------------------------------------  Patient tolerating by mouth well.  No ongoing nausea vomiting.  Patient does still note ongoing pain in the epigastrium, but she does have pancreatitis which is known.  Is tolerating by mouth well, so there is no signs of ongoing distress.  Appears appropriate for ongoing management.  Recent prescription less than 1 month ago for hydrocodone, discussed with the patient and notified her that the best course of action P be her antibiotics, staying well-hydrated, avoidance of alcohol.  Place patient on ciprofloxacin for possible urinary tract infection, urine culture ordered.  No signs or symptoms of pyelonephritis or sepsis.  Patient appears appropriate for ongoing outpatient therapy and follow-up with gastroenterology  ----------------------------------------- 1:04 PM on 01/29/2017 -----------------------------------------  Patient asymptomatic, but on recheck her blood sugar is 50.  Will give juice, continue to monitor here for additional check and ensure stability before discharge. ____________________________________________  ----------------------------------------- 2:48 PM on 01/29/2017 -----------------------------------------  Patient fully alert and oriented.  No vomiting.  Appears comfortable in no noted discomfort.  Appears appropriate for ongoing outpatient care and close follow-up.  Discussed with patient care and return precautions.  She will have friends/family drive her home, not driving herself.  Glucose is normalized, taking by mouth well without difficulty.  Return precautions and treatment recommendations and follow-up discussed with the patient who is agreeable with the plan.   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     Delman Kitten, MD 01/29/17 [phone]

## 2017-01-29 NOTE — ED Notes (Signed)
CBG levels low, checked twice, patient given two orange juices, Dr. Jacqualine Code notified.  Will continue to monitor blood sugars in ED.

## 2017-01-31 LAB — URINE CULTURE: SPECIAL REQUESTS: NORMAL

## 2017-02-01 LAB — GLUCOSE, CAPILLARY: Glucose-Capillary: 69 mg/dL (ref 65–99)

## 2017-03-26 ENCOUNTER — Encounter: Payer: Self-pay | Admitting: Certified Nurse Midwife

## 2017-04-25 ENCOUNTER — Emergency Department: Payer: Medicaid Other

## 2017-04-25 ENCOUNTER — Emergency Department
Admission: EM | Admit: 2017-04-25 | Discharge: 2017-04-25 | Disposition: A | Discharge status: Home / Self Care | Payer: Medicaid Other | Attending: Emergency Medicine | Admitting: Emergency Medicine

## 2017-04-25 ENCOUNTER — Encounter: Payer: Self-pay | Admitting: Emergency Medicine

## 2017-04-25 ENCOUNTER — Other Ambulatory Visit: Payer: Self-pay

## 2017-04-25 DIAGNOSIS — E119 Type 2 diabetes mellitus without complications: Secondary | ICD-10-CM | POA: Insufficient documentation

## 2017-04-25 DIAGNOSIS — I10 Essential (primary) hypertension: Secondary | ICD-10-CM | POA: Diagnosis not present

## 2017-04-25 DIAGNOSIS — T50901A Poisoning by unspecified drugs, medicaments and biological substances, accidental (unintentional), initial encounter: Secondary | ICD-10-CM | POA: Diagnosis present

## 2017-04-25 DIAGNOSIS — R079 Chest pain, unspecified: Secondary | ICD-10-CM

## 2017-04-25 DIAGNOSIS — Z79899 Other long term (current) drug therapy: Secondary | ICD-10-CM | POA: Insufficient documentation

## 2017-04-25 DIAGNOSIS — F1010 Alcohol abuse, uncomplicated: Secondary | ICD-10-CM | POA: Diagnosis not present

## 2017-04-25 DIAGNOSIS — Z87891 Personal history of nicotine dependence: Secondary | ICD-10-CM | POA: Insufficient documentation

## 2017-04-25 DIAGNOSIS — F329 Major depressive disorder, single episode, unspecified: Secondary | ICD-10-CM | POA: Diagnosis not present

## 2017-04-25 DIAGNOSIS — J45909 Unspecified asthma, uncomplicated: Secondary | ICD-10-CM | POA: Diagnosis not present

## 2017-04-25 LAB — CBC WITH DIFFERENTIAL/PLATELET
BASOS PCT: 1 %
Basophils Absolute: 0 10*3/uL (ref 0–0.1)
Eosinophils Absolute: 0.1 10*3/uL (ref 0–0.7)
Eosinophils Relative: 1 %
HCT: 37 % (ref 35.0–47.0)
HEMOGLOBIN: 12.5 g/dL (ref 12.0–16.0)
LYMPHS ABS: 2 10*3/uL (ref 1.0–3.6)
LYMPHS PCT: 35 %
MCH: 30.6 pg (ref 26.0–34.0)
MCHC: 33.7 g/dL (ref 32.0–36.0)
MCV: 90.8 fL (ref 80.0–100.0)
MONOS PCT: 12 %
Monocytes Absolute: 0.7 10*3/uL (ref 0.2–0.9)
NEUTROS ABS: 3 10*3/uL (ref 1.4–6.5)
NEUTROS PCT: 51 %
Platelets: 178 10*3/uL (ref 150–440)
RBC: 4.07 MIL/uL (ref 3.80–5.20)
RDW: 12.4 % (ref 11.5–14.5)
WBC: 5.8 10*3/uL (ref 3.6–11.0)

## 2017-04-25 LAB — GLUCOSE, CAPILLARY: Glucose-Capillary: 365 mg/dL — ABNORMAL HIGH (ref 65–99)

## 2017-04-25 LAB — COMPREHENSIVE METABOLIC PANEL
ALBUMIN: 3.5 g/dL (ref 3.5–5.0)
ALT: 45 U/L (ref 14–54)
ANION GAP: 11 (ref 5–15)
AST: 39 U/L (ref 15–41)
Alkaline Phosphatase: 180 U/L — ABNORMAL HIGH (ref 38–126)
BUN: 21 mg/dL — ABNORMAL HIGH (ref 6–20)
CHLORIDE: 101 mmol/L (ref 101–111)
CO2: 21 mmol/L — AB (ref 22–32)
Calcium: 8.5 mg/dL — ABNORMAL LOW (ref 8.9–10.3)
Creatinine, Ser: 1.02 mg/dL — ABNORMAL HIGH (ref 0.44–1.00)
GFR calc Af Amer: >60 mL/min (ref >60)
GFR calc non Af Amer: 59 mL/min — ABNORMAL LOW (ref >60)
GLUCOSE: 343 mg/dL — AB (ref 65–99)
POTASSIUM: 4 mmol/L (ref 3.5–5.1)
SODIUM: 133 mmol/L — AB (ref 135–145)
Total Bilirubin: 0.7 mg/dL (ref 0.3–1.2)
Total Protein: 7.2 g/dL (ref 6.5–8.1)

## 2017-04-25 LAB — TROPONIN I: Troponin I: <0.03 ng/mL (ref <0.03)

## 2017-04-25 LAB — URINE DRUG SCREEN, QUALITATIVE (ARMC ONLY)
Amphetamines, Ur Screen: NOT DETECTED
BARBITURATES, UR SCREEN: NOT DETECTED
Benzodiazepine, Ur Scrn: POSITIVE — AB
CANNABINOID 50 NG, UR ~~LOC~~: NOT DETECTED
Cocaine Metabolite,Ur ~~LOC~~: NOT DETECTED
MDMA (ECSTASY) UR SCREEN: NOT DETECTED
METHADONE SCREEN, URINE: NOT DETECTED
Opiate, Ur Screen: NOT DETECTED
Phencyclidine (PCP) Ur S: NOT DETECTED
TRICYCLIC, UR SCREEN: NOT DETECTED

## 2017-04-25 LAB — SALICYLATE LEVEL: Salicylate Lvl: <7 mg/dL (ref 2.8–30.0)

## 2017-04-25 LAB — URINALYSIS, COMPLETE (UACMP) WITH MICROSCOPIC
BILIRUBIN URINE: NEGATIVE
Glucose, UA: >=500 mg/dL — AB
Ketones, ur: NEGATIVE mg/dL
Nitrite: NEGATIVE
PROTEIN: 30 mg/dL — AB
Specific Gravity, Urine: 1.002 — ABNORMAL LOW (ref 1.005–1.030)
pH: 6 (ref 5.0–8.0)

## 2017-04-25 LAB — ACETAMINOPHEN LEVEL

## 2017-04-25 LAB — LIPASE, BLOOD: Lipase: 147 U/L — ABNORMAL HIGH (ref 11–51)

## 2017-04-25 LAB — ETHANOL: ALCOHOL ETHYL (B): 162 mg/dL — AB (ref <10)

## 2017-04-25 MED ORDER — LORAZEPAM 2 MG/ML IJ SOLN: 0.0000 mg | Freq: Four times a day (QID) | INTRAMUSCULAR | Status: DC

## 2017-04-25 MED ORDER — SODIUM CHLORIDE 0.9 % IV BOLUS
1000.0000 mL | Freq: Once | INTRAVENOUS | Status: AC
  Administered 2017-04-25: 1000 mL via INTRAVENOUS

## 2017-04-25 MED ORDER — LORAZEPAM 2 MG PO TABS: 0.0000 mg | ORAL_TABLET | Freq: Two times a day (BID) | ORAL | Status: DC

## 2017-04-25 MED ORDER — THIAMINE HCL 100 MG/ML IJ SOLN: 100.0000 mg | Freq: Every day | INTRAMUSCULAR | Status: DC

## 2017-04-25 MED ORDER — LORAZEPAM 2 MG PO TABS: 0.0000 mg | ORAL_TABLET | Freq: Four times a day (QID) | ORAL | Status: DC

## 2017-04-25 MED ORDER — VITAMIN B-1 100 MG PO TABS: 100.0000 mg | ORAL_TABLET | Freq: Every day | ORAL | Status: DC

## 2017-04-25 MED ORDER — LORAZEPAM 2 MG/ML IJ SOLN: 0.0000 mg | Freq: Two times a day (BID) | INTRAMUSCULAR | Status: DC

## 2017-04-25 MED ORDER — INSULIN ASPART 100 UNIT/ML ~~LOC~~ SOLN
6.0000 [IU] | Freq: Once | SUBCUTANEOUS | Status: AC
Start: 2017-04-25 — End: 2017-04-25
  Administered 2017-04-25: 6 [IU] via INTRAVENOUS
  Filled 2017-04-25: qty 1

## 2017-04-25 NOTE — ED Notes (Signed)
Pt given lunch tray.

## 2017-04-25 NOTE — ED Notes (Signed)
[facility]  All informed of status

## 2017-04-25 NOTE — ED Notes (Signed)
Rescinded by [facility]

## 2017-04-25 NOTE — ED Notes (Signed)
Pt belongings placed in belongings bag and witnessed by RN, Levada Dy. Belongings consist of purse, pink bedroom shoes, stretch pants, shirt. Bag labeled and placed in [place] locker area.

## 2017-04-25 NOTE — ED Notes (Signed)
BEHAVIORAL HEALTH ROUNDING Patient sleeping: No. Patient alert and oriented: yes Behavior appropriate: Yes.  ; If no, describe:  Nutrition and fluids offered: yes Toileting and hygiene offered: Yes  Sitter present: q15 minute observations and security monitoring Law enforcement present: Yes    

## 2017-04-25 NOTE — ED Provider Notes (Addendum)
[facility] Emergency Department Provider Note  ____________________________________________   I have reviewed the triage vital signs and the nursing notes. Where available I have reviewed prior notes and, if possible and indicated, outside hospital notes.    HISTORY  Chief Complaint Ingestion    HPI Karen [name] is a 62 y.o. female  Who has hx of etoh dka depression knee pain states that she was having trouble sleeping because of her chronic knee pain and she woke up this morning and took some extra lorazepam.  She states she does not try to kill herself, she just I will help her sleep.  She also drank beer.  "One beer".  Her husband realized that she had had extra lorazepam and also a muscle relaxer and called 911.  Patient is adamant that this was not a suicidal attempt to assist to help her sleep.  She does have history of EtOH abuse.  Upon arrival of paramedics and police to the door, she decided to announced that she had chest pain, blood pressure was somewhat elevated is unclear if she took any of her medications this morning although it is reported that she may have taken her insulin.  Sugar was in the 400s.  Patient states she has a slight headache, she denies chest pain to me, she did receive nitroglycerin en route had a normal EKG, received aspirin.  She has not had any vomiting or diarrhea she denies abdominal pain at this time.  Her story seems to fluctuate some.  She cannot characterize the chest pain.   Past Medical History:  Diagnosis Date  . Alcoholic pancreatitis   . Asthma   . Depression   . Diabetes mellitus without complication ([place])   . DKA (diabetic ketoacidoses) ([place]) 02/17/2015  . Emphysematous cystitis   . Hypercholesteremia   . Hypertension   . Hypokalemia     Patient Active Problem List   Diagnosis Date Noted  . Pancreatitis, acute 01/03/2017  . ETOH abuse 01/03/2017  . UTI (urinary tract infection) 01/03/2017  . Nausea and  vomiting   . Esophageal candidiasis ([place])   . Intractable vomiting with nausea   . Hypertensive urgency 04/21/2016  . Abdominal pain   . Acute renal failure ([place])   . Nausea vomiting and diarrhea   . Smoker   . Poorly controlled type 2 diabetes mellitus ([place])   . Acute renal insufficiency 10/29/2015  . Elevated troponin 10/29/2015  . Urinary tract infection without hematuria 10/29/2015  . Left flank pain 10/29/2015  . Malignant essential hypertension 10/29/2015  . DKA (diabetic ketoacidoses) ([place]) 02/17/2015  . Emphysematous cystitis     Past Surgical History:  Procedure Laterality Date  . ABDOMINAL HYSTERECTOMY  1996  . APPENDECTOMY  1997  . ESOPHAGOGASTRODUODENOSCOPY (EGD) WITH PROPOFOL N/A 04/24/2016   Procedure: ESOPHAGOGASTRODUODENOSCOPY (EGD) WITH PROPOFOL;  Surgeon: [name], MD;  Location: [facility] ENDOSCOPY;  Service: Endoscopy;  Laterality: N/A;  . HAND SURGERY  1988  . THYROID SURGERY  2013    Prior to Admission medications   Medication Sig Start Date End Date Taking? Authorizing Provider  amLODipine (NORVASC) 5 MG tablet Take 5 mg by mouth daily.    [provider]  atorvastatin (LIPITOR) 40 MG tablet Take 1 tablet (40 mg total) by mouth daily at 6 PM. 12/12/16   Demetrios Loll, MD  cyclobenzaprine (FLEXERIL) 5 MG tablet Take 1 tablet (5 mg total) by mouth 2 (two) times daily. 01/07/17   Dustin Flock, MD  diphenhydrAMINE (  BENADRYL) 25 MG tablet Take 25 mg by mouth every 6 (six) hours as needed.    [provider]  flurazepam (DALMANE) 15 MG capsule Take 1 capsule (15 mg total) by mouth at bedtime as needed for sleep. 01/07/17   Dustin Flock, MD  hydrALAZINE (APRESOLINE) 25 MG tablet Take 1 tablet (25 mg total) by mouth every 8 (eight) hours. 01/07/17   Dustin Flock, MD  HYDROcodone-acetaminophen (NORCO) 5-325 MG tablet Take 1 tablet by mouth every 6 (six) hours as needed for up to 15 doses for severe pain. 01/07/17   Dustin Flock, MD  insulin aspart  protamine - aspart (NOVOLOG MIX 70/30 FLEXPEN) (70-30) 100 UNIT/ML FlexPen Inject 15 Units into the skin 2 (two) times daily.    [provider]  ondansetron (ZOFRAN ODT) 4 MG disintegrating tablet Take 1 tablet (4 mg total) by mouth every 6 (six) hours as needed for nausea or vomiting. 01/29/17   Delman Kitten, MD  GLUCERNA (GLUCERNA) LIQD Take 237 mLs by mouth 3 (three) times daily between meals. 02/19/15 02/27/15  Bettey Costa, MD    Allergies Penicillins; Reglan [metoclopramide]; and Percocet [oxycodone-acetaminophen]  Family History  Family history unknown: Yes    Social History Social History   Tobacco Use  . Smoking status: Former Research scientist (life sciences)  . Smokeless tobacco: Never Used  Substance Use Topics  . Alcohol use: Yes    Comment: 40oz/day  . Drug use: No    Review of Systems Constitutional: No fever/chills Eyes: No visual changes. ENT: No sore throat. No stiff neck no neck pain Cardiovascular: Denies chest pain this time although did apparently tell someone that she had chest pain earlier. Respiratory: Denies shortness of breath. Gastrointestinal:   no vomiting.  No diarrhea.  No constipation. Genitourinary: Negative for dysuria. Musculoskeletal: Negative lower extremity swelling Skin: Negative for rash. Neurological: Negative for severe headaches, focal weakness or numbness.   ____________________________________________   PHYSICAL EXAM:  VITAL SIGNS: ED Triage Vitals  Enc Vitals Group     BP      Pulse      Resp      Temp      Temp src      SpO2      Weight      Height      Head Circumference      Peak Flow      Pain Score      Pain Loc      Pain Edu?      Excl. in [place]?     Constitutional: Alert and oriented.  She is in no acute distress, seems slightly somnolent follows commands and answers questions however Eyes: Conjunctivae are normal Head: Atraumatic HEENT: No congestion/rhinnorhea. Mucous membranes are moist.  Oropharynx  non-erythematous Neck:   Nontender with no meningismus, no masses, no stridor Cardiovascular: Normal rate, regular rhythm. Grossly normal heart sounds.  Good peripheral circulation. Respiratory: Normal respiratory effort.  No retractions. Lungs CTAB. Abdominal: Soft and nontender. No distention. No guarding no rebound Back:  There is no focal tenderness or step off.  there is no midline tenderness there are no lesions noted. there is no CVA tenderness Musculoskeletal: No lower extremity tenderness, no upper extremity tenderness. No joint effusions, no DVT signs strong distal pulses no edema Neurologic:  Normal speech and language. No gross focal neurologic deficits are appreciated.  Skin:  Skin is warm, dry and intact. No rash noted. Psychiatric: Mood and affect are somewhat depressed ____________________________________________   LABS (all labs  ordered are listed, but only abnormal results are displayed)  Labs Reviewed  ETHANOL  URINALYSIS, COMPLETE (UACMP) WITH MICROSCOPIC  URINE DRUG SCREEN, QUALITATIVE ([facility] ONLY)  COMPREHENSIVE METABOLIC PANEL  TROPONIN I  CBC WITH DIFFERENTIAL/PLATELET  SALICYLATE LEVEL  ACETAMINOPHEN LEVEL  LIPASE, BLOOD    Pertinent labs  results that were available during my care of the patient were reviewed by me and considered in my medical decision making (see chart for details). ____________________________________________  EKG  I personally interpreted any EKGs ordered by me or triage  ____________________________________________  RADIOLOGY  Pertinent labs & imaging results that were available during my care of the patient were reviewed by me and considered in my medical decision making (see chart for details). If possible, patient and/or family made aware of any abnormal findings.  No results found. ____________________________________________    PROCEDURES  Procedure(s) performed: None  Procedures  Critical Care performed:  None  ____________________________________________   INITIAL IMPRESSION / ASSESSMENT AND PLAN / ED COURSE  Pertinent labs & imaging results that were available during my care of the patient were reviewed by me and considered in my medical decision making (see chart for details).  Patient here from EMS for a host of complaints she took some extra medications not to kill herself but did sleep, she has a history of alcohol abuse she had alcohol already this morning, she apparently had some chest pain on the way in, blood pressure was up her sugars are up, she has no ongoing complaints at this moment.    ----------------------------------------- 10:37 AM on 04/25/2017 -----------------------------------------  Blood sugar is coming down from EMS we will watch it, patient's blood pressure is coming down, she did tell the TTS consult that she was suicidal, and hearing voices.  We will take out involuntary commitment and have her evaluated by psychiatry  ----------------------------------------- 3:41 PM on 04/25/2017 -----------------------------------------  She is fully evaluated by psychiatry no SI or HI she denies it to me I never heard her say it, psychiatry is reversed her IVC we will discharge I did talk to her friend, they feel that she is safe for discharge as well they do not think she is suicidal either.  Counseled patient about alcohol abuse and taking medications only as prescribed.  We will recheck her sugar, if reassuring we will send her home.  She has no complaints and no acute distress she is not clinically intoxicated.  Urinalysis was somewhat questionable but she has no symptoms, we will send a urine culture and we will defer treatment at this time.. Glucose is somewhat elevated 300s, patient is not used her insulin yet today, this is unusual for her she states, no evidence of DKA we will give her insulin prior to discharge, she was staying with family, I did discuss with family,  they agree that patient is not a danger to self or others. Declines further care and is requesting discharge she has a ride home.   ____________________________________________   FINAL CLINICAL IMPRESSION(S) / ED DIAGNOSES  Final diagnoses:  None      This chart was dictated using voice recognition software.  Despite best efforts to proofread,  errors can occur which can change meaning.      Schuyler Amor, MD 04/25/17 [id number]    Schuyler Amor, MD 04/25/17 1037    Schuyler Amor, MD 04/25/17 1543    Schuyler Amor, MD 04/25/17 1550

## 2017-04-25 NOTE — ED Notes (Signed)
See triage note, pt states that she took 5 15 mg capsules of Flurazepam because she was having knee pain and she wanted to go to sleep. Pt states that she was not harming herself but she was trying to be able to sleep. Pt states that she now has a headache. Pt in NAD at this time. Denies SI.

## 2017-04-25 NOTE — ED Triage Notes (Signed)
Ptto ED via [facility] from home for overdose. Per EMS pt states that she was having bilateral knee pain so she took Five 15 mg tablets of Flurazepam followed by ETOH consumption. Pt then woke her significant other up and told him she took the medications. Pt denies SI. Per EMS when [employer] arrived pt c/o chest pain. Pt currently has 1.5 inch of nitro paste on left chest due to initial BP of 220/110. Pt was given 324 mg of ASA by EMS. Pt still c/o chest pain on arrival. Pt in NAD at this time.      Current BP 160/89

## 2017-04-25 NOTE — Discharge Instructions (Addendum)
Only take medications as prescribed, be careful drinking alcohol as it is bad few, your blood sugar is elevated here we are giving you insulin stay with family eat if you feel that your sugars going low and monitor your sugars carefully, seek help for your alcohol abuse, please return to the emergency room if you have any thoughts of hurting yourself or anyone else
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
## 2017-04-26 NOTE — Progress Notes (Addendum)
Inpatient Diabetes Program Recommendations  AACE/ADA: New Consensus Statement on Inpatient Glycemic Control (2015)  Target Ranges:  Prepandial:   less than 140 mg/dL      Peak postprandial:   less than 180 mg/dL (1-2 hours)      Critically ill patients:  140 - 180 mg/dL  Results for PAYSEN, GOZA (MRN [id number]) as of 04/26/2017 09:01  Ref. Range 04/25/2017 15:46  Glucose-Capillary Latest Ref Range: 65 - 99 mg/dL 365 (H)   Results for SAVERA, DONSON (MRN [id number]) as of 04/26/2017 09:01  Ref. Range 12/11/2016 17:44 01/03/2017 13:07  Hemoglobin A1C Latest Ref Range: 4.8 - 5.6 % 10.3 (H) 10.7 (H)   Review of Glycemic Control  Outpatient Diabetes medications: 70/30 15 units BID Current orders for Inpatient glycemic control: None  NOTE: Called Emergency Room regarding patient and was informed that patient was discharged from the Emergency Room yesterday afternoon.   Thanks, Barnie Alderman, RN, MSN, CDE Diabetes Coordinator Inpatient Diabetes Program [phone] (Team Pager from 8am to 5pm)

## 2017-05-15 ENCOUNTER — Emergency Department
Admission: EM | Admit: 2017-05-15 | Discharge: 2017-05-15 | Disposition: A | Discharge status: Home / Self Care | Payer: Medicaid Other | Attending: Emergency Medicine | Admitting: Emergency Medicine

## 2017-05-15 ENCOUNTER — Encounter: Payer: Self-pay | Admitting: Emergency Medicine

## 2017-05-15 ENCOUNTER — Other Ambulatory Visit: Payer: Self-pay

## 2017-05-15 DIAGNOSIS — R112 Nausea with vomiting, unspecified: Secondary | ICD-10-CM | POA: Diagnosis not present

## 2017-05-15 DIAGNOSIS — R0602 Shortness of breath: Secondary | ICD-10-CM | POA: Insufficient documentation

## 2017-05-15 DIAGNOSIS — R109 Unspecified abdominal pain: Secondary | ICD-10-CM | POA: Diagnosis not present

## 2017-05-15 DIAGNOSIS — Z5321 Procedure and treatment not carried out due to patient leaving prior to being seen by health care provider: Secondary | ICD-10-CM | POA: Insufficient documentation

## 2017-05-15 DIAGNOSIS — R42 Dizziness and giddiness: Secondary | ICD-10-CM | POA: Insufficient documentation

## 2017-05-15 LAB — CBC
HCT: 36.1 % (ref 35.0–47.0)
Hemoglobin: 12.5 g/dL (ref 12.0–16.0)
MCH: 31.4 pg (ref 26.0–34.0)
MCHC: 34.6 g/dL (ref 32.0–36.0)
MCV: 91 fL (ref 80.0–100.0)
PLATELETS: 196 10*3/uL (ref 150–440)
RBC: 3.97 MIL/uL (ref 3.80–5.20)
RDW: 12.4 % (ref 11.5–14.5)
WBC: 6.9 10*3/uL (ref 3.6–11.0)

## 2017-05-15 LAB — COMPREHENSIVE METABOLIC PANEL
ALT: 63 U/L — AB (ref 14–54)
AST: 57 U/L — ABNORMAL HIGH (ref 15–41)
Albumin: 3.6 g/dL (ref 3.5–5.0)
Alkaline Phosphatase: 232 U/L — ABNORMAL HIGH (ref 38–126)
Anion gap: 10 (ref 5–15)
BILIRUBIN TOTAL: 0.5 mg/dL (ref 0.3–1.2)
BUN: 23 mg/dL — ABNORMAL HIGH (ref 6–20)
CALCIUM: 9.2 mg/dL (ref 8.9–10.3)
CO2: 25 mmol/L (ref 22–32)
CREATININE: 1 mg/dL (ref 0.44–1.00)
Chloride: 102 mmol/L (ref 101–111)
GFR, EST NON AFRICAN AMERICAN: 60 mL/min — AB (ref >60)
Glucose, Bld: 122 mg/dL — ABNORMAL HIGH (ref 65–99)
Potassium: 3.7 mmol/L (ref 3.5–5.1)
Sodium: 137 mmol/L (ref 135–145)
TOTAL PROTEIN: 7.7 g/dL (ref 6.5–8.1)

## 2017-05-15 LAB — LIPASE, BLOOD: Lipase: 60 U/L — ABNORMAL HIGH (ref 11–51)

## 2017-05-15 NOTE — ED Triage Notes (Signed)
Pt with c/o dizziness, abd pain, n/v, sob that started last night. Pt stares that she has been unable to keep anything down since last night.

## 2017-05-17 ENCOUNTER — Encounter: Payer: Self-pay | Admitting: *Deleted

## 2017-05-17 ENCOUNTER — Other Ambulatory Visit: Payer: Self-pay

## 2017-05-17 ENCOUNTER — Emergency Department
Admission: EM | Admit: 2017-05-17 | Discharge: 2017-05-18 | Disposition: A | Discharge status: Home / Self Care | Payer: Medicaid Other | Attending: Emergency Medicine | Admitting: Emergency Medicine

## 2017-05-17 DIAGNOSIS — I10 Essential (primary) hypertension: Secondary | ICD-10-CM | POA: Insufficient documentation

## 2017-05-17 DIAGNOSIS — Z79899 Other long term (current) drug therapy: Secondary | ICD-10-CM | POA: Insufficient documentation

## 2017-05-17 DIAGNOSIS — Z794 Long term (current) use of insulin: Secondary | ICD-10-CM | POA: Diagnosis not present

## 2017-05-17 DIAGNOSIS — R112 Nausea with vomiting, unspecified: Secondary | ICD-10-CM | POA: Diagnosis present

## 2017-05-17 DIAGNOSIS — F1010 Alcohol abuse, uncomplicated: Secondary | ICD-10-CM | POA: Insufficient documentation

## 2017-05-17 DIAGNOSIS — Z87891 Personal history of nicotine dependence: Secondary | ICD-10-CM | POA: Insufficient documentation

## 2017-05-17 DIAGNOSIS — E119 Type 2 diabetes mellitus without complications: Secondary | ICD-10-CM | POA: Insufficient documentation

## 2017-05-17 DIAGNOSIS — F329 Major depressive disorder, single episode, unspecified: Secondary | ICD-10-CM | POA: Insufficient documentation

## 2017-05-17 DIAGNOSIS — R748 Abnormal levels of other serum enzymes: Secondary | ICD-10-CM

## 2017-05-17 DIAGNOSIS — J45909 Unspecified asthma, uncomplicated: Secondary | ICD-10-CM | POA: Diagnosis not present

## 2017-05-17 DIAGNOSIS — R109 Unspecified abdominal pain: Secondary | ICD-10-CM | POA: Diagnosis not present

## 2017-05-17 LAB — CBC WITH DIFFERENTIAL/PLATELET
BASOS ABS: 0.1 10*3/uL (ref 0–0.1)
Basophils Relative: 1 %
EOS PCT: 1 %
Eosinophils Absolute: 0.1 10*3/uL (ref 0–0.7)
HCT: 38.8 % (ref 35.0–47.0)
Hemoglobin: 13.4 g/dL (ref 12.0–16.0)
LYMPHS PCT: 21 %
Lymphs Abs: 1.7 10*3/uL (ref 1.0–3.6)
MCH: 31.2 pg (ref 26.0–34.0)
MCHC: 34.6 g/dL (ref 32.0–36.0)
MCV: 90.3 fL (ref 80.0–100.0)
Monocytes Absolute: 0.8 10*3/uL (ref 0.2–0.9)
Monocytes Relative: 11 %
NEUTROS PCT: 66 %
Neutro Abs: 5.3 10*3/uL (ref 1.4–6.5)
PLATELETS: 179 10*3/uL (ref 150–440)
RBC: 4.29 MIL/uL (ref 3.80–5.20)
RDW: 12.6 % (ref 11.5–14.5)
WBC: 8 10*3/uL (ref 3.6–11.0)

## 2017-05-17 LAB — LIPASE, BLOOD: LIPASE: 123 U/L — AB (ref 11–51)

## 2017-05-17 LAB — COMPREHENSIVE METABOLIC PANEL
ALT: 48 U/L (ref 14–54)
AST: 34 U/L (ref 15–41)
Albumin: 3.9 g/dL (ref 3.5–5.0)
Alkaline Phosphatase: 211 U/L — ABNORMAL HIGH (ref 38–126)
Anion gap: 18 — ABNORMAL HIGH (ref 5–15)
BUN: 25 mg/dL — AB (ref 6–20)
CHLORIDE: 97 mmol/L — AB (ref 101–111)
CO2: 19 mmol/L — AB (ref 22–32)
CREATININE: 1.14 mg/dL — AB (ref 0.44–1.00)
Calcium: 9.3 mg/dL (ref 8.9–10.3)
GFR calc Af Amer: 59 mL/min — ABNORMAL LOW (ref >60)
GFR, EST NON AFRICAN AMERICAN: 51 mL/min — AB (ref >60)
Glucose, Bld: 314 mg/dL — ABNORMAL HIGH (ref 65–99)
Potassium: 3.4 mmol/L — ABNORMAL LOW (ref 3.5–5.1)
Sodium: 134 mmol/L — ABNORMAL LOW (ref 135–145)
Total Bilirubin: 0.9 mg/dL (ref 0.3–1.2)
Total Protein: 8 g/dL (ref 6.5–8.1)

## 2017-05-17 MED ORDER — SODIUM CHLORIDE 0.9 % IV BOLUS
1000.0000 mL | Freq: Once | INTRAVENOUS | Status: AC
Start: 2017-05-17 — End: 2017-05-17
  Administered 2017-05-17: 1000 mL via INTRAVENOUS

## 2017-05-17 MED ORDER — PROCHLORPERAZINE EDISYLATE 10 MG/2ML IJ SOLN
10.0000 mg | Freq: Once | INTRAMUSCULAR | Status: AC
  Administered 2017-05-17: 10 mg via INTRAVENOUS
  Filled 2017-05-17: qty 2

## 2017-05-17 MED ORDER — METOCLOPRAMIDE HCL 5 MG/ML IJ SOLN
INTRAMUSCULAR | Status: AC
  Administered 2017-05-17: 10 mg via INTRAVENOUS
  Filled 2017-05-17: qty 2

## 2017-05-17 MED ORDER — FENTANYL CITRATE (PF) 100 MCG/2ML IJ SOLN
INTRAMUSCULAR | Status: AC
  Administered 2017-05-17: 50 ug via INTRAVENOUS
  Filled 2017-05-17: qty 2

## 2017-05-17 MED ORDER — FENTANYL CITRATE (PF) 100 MCG/2ML IJ SOLN
50.0000 ug | Freq: Once | INTRAMUSCULAR | Status: AC
Start: 2017-05-17 — End: 2017-05-17
  Administered 2017-05-17: 50 ug via INTRAVENOUS

## 2017-05-17 MED ORDER — METOCLOPRAMIDE HCL 5 MG/ML IJ SOLN
10.0000 mg | Freq: Once | INTRAMUSCULAR | Status: AC
Start: 2017-05-17 — End: 2017-05-17
  Administered 2017-05-17: 10 mg via INTRAVENOUS

## 2017-05-17 NOTE — ED Notes (Signed)
Pt reporting continued abd pain and nausea. One episode of vomiting since medication administration. MD made aware. Pt continues to be in NAD at this time and is lying in bed at this time.

## 2017-05-17 NOTE — ED Triage Notes (Signed)
Pt to ED reporting abd pain over the weekend that has worsened with nausea and vomiting. Pt is vomiting upon arrival to ED and reports having eben to ED previously but left due to the wait time. No fevers reported. Pt is alert and oriented x 4. NAD noted at this time.

## 2017-05-17 NOTE — ED Provider Notes (Signed)
[facility] Emergency Department Provider Note  ____________________________________________   I have reviewed the triage vital signs and the nursing notes.   HISTORY  Chief Complaint Nausea and vomiting  History limited by: Not Limited   HPI Karen [name] is a 62 y.o. female who presents to the emergency department today because of concerns for nausea vomiting and abdominal pain.  The symptoms started 2 days ago.  She did come to the emergency department however left before being seen 2 days ago.  Symptoms have continued.  She describes the pain as being severe.  Is located in the upper abdomen.  Has been accompanied by multiple episodes of nausea and vomiting.  She denies any blood in her vomit.  She denies any fevers.  States she has a history of chronic pancreatitis and this reminds her of that.   Per medical record review patient has a history of ER visits in the past for pancreatitis  Past Medical History:  Diagnosis Date  . Alcoholic pancreatitis   . Asthma   . Depression   . Diabetes mellitus without complication ([place])   . DKA (diabetic ketoacidoses) ([place]) 02/17/2015  . Emphysematous cystitis   . Hypercholesteremia   . Hypertension   . Hypokalemia     Patient Active Problem List   Diagnosis Date Noted  . Pancreatitis, acute 01/03/2017  . ETOH abuse 01/03/2017  . UTI (urinary tract infection) 01/03/2017  . Nausea and vomiting   . Esophageal candidiasis ([place])   . Intractable vomiting with nausea   . Hypertensive urgency 04/21/2016  . Abdominal pain   . Acute renal failure ([place])   . Nausea vomiting and diarrhea   . Smoker   . Poorly controlled type 2 diabetes mellitus ([place])   . Acute renal insufficiency 10/29/2015  . Elevated troponin 10/29/2015  . Urinary tract infection without hematuria 10/29/2015  . Left flank pain 10/29/2015  . Malignant essential hypertension 10/29/2015  . DKA (diabetic ketoacidoses) ([place]) 02/17/2015  .  Emphysematous cystitis     Past Surgical History:  Procedure Laterality Date  . ABDOMINAL HYSTERECTOMY  1996  . APPENDECTOMY  1997  . ESOPHAGOGASTRODUODENOSCOPY (EGD) WITH PROPOFOL N/A 04/24/2016   Procedure: ESOPHAGOGASTRODUODENOSCOPY (EGD) WITH PROPOFOL;  Surgeon: [name], MD;  Location: [facility] ENDOSCOPY;  Service: Endoscopy;  Laterality: N/A;  . HAND SURGERY  1988  . THYROID SURGERY  2013    Prior to Admission medications   Medication Sig Start Date End Date Taking? Authorizing Provider  atorvastatin (LIPITOR) 10 MG tablet Take 10 mg by mouth at bedtime.    [provider]  atorvastatin (LIPITOR) 40 MG tablet Take 1 tablet (40 mg total) by mouth daily at 6 PM. Patient not taking: Reported on 04/25/2017 12/12/16   Demetrios Loll, MD  cyclobenzaprine (FLEXERIL) 5 MG tablet Take 1 tablet (5 mg total) by mouth 2 (two) times daily. 01/07/17   Dustin Flock, MD  diphenhydrAMINE (BENADRYL) 25 MG tablet Take 25 mg by mouth every 6 (six) hours as needed.    [provider]  flurazepam (DALMANE) 15 MG capsule Take 1 capsule (15 mg total) by mouth at bedtime as needed for sleep. 01/07/17   Dustin Flock, MD  hydrALAZINE (APRESOLINE) 25 MG tablet Take 1 tablet (25 mg total) by mouth every 8 (eight) hours. 01/07/17   Dustin Flock, MD  HYDROcodone-acetaminophen (NORCO) 5-325 MG tablet Take 1 tablet by mouth every 6 (six) hours as needed for up to 15 doses for severe pain. 01/07/17  Dustin Flock, MD  insulin aspart protamine - aspart (NOVOLOG MIX 70/30 FLEXPEN) (70-30) 100 UNIT/ML FlexPen Inject 15 Units into the skin 2 (two) times daily.    [provider]  ondansetron (ZOFRAN ODT) 4 MG disintegrating tablet Take 1 tablet (4 mg total) by mouth every 6 (six) hours as needed for nausea or vomiting. Patient not taking: Reported on 04/25/2017 01/29/17   Delman Kitten, MD  GLUCERNA [facility]) LIQD Take 237 mLs by mouth 3 (three) times daily between meals. 02/19/15 02/27/15  Bettey Costa,  MD    Allergies Penicillins; Reglan [metoclopramide]; and Percocet [oxycodone-acetaminophen]  Family History  Family history unknown: Yes    Social History Social History   Tobacco Use  . Smoking status: Former Research scientist (life sciences)  . Smokeless tobacco: Never Used  Substance Use Topics  . Alcohol use: Yes    Comment: 40oz/day  . Drug use: No    Review of Systems Constitutional: No fever/chills Eyes: No visual changes. ENT: No sore throat. Cardiovascular: Denies chest pain. Respiratory: Denies shortness of breath. Gastrointestinal: Positive for abdominal pain, nausea and vomiting Genitourinary: Negative for dysuria. Musculoskeletal: Negative for back pain. Skin: Negative for rash. Neurological: Negative for headaches, focal weakness or numbness.  ____________________________________________   PHYSICAL EXAM:  VITAL SIGNS: ED Triage Vitals  Enc Vitals Group     BP 213/101     Pulse 104     Resp 16     Temp      Temp src      SpO2 95     Weight    Constitutional: Alert and oriented.  Eyes: Conjunctivae are normal.  ENT   Head: Normocephalic and atraumatic.   Nose: No congestion/rhinnorhea.   Mouth/Throat: Mucous membranes are moist.   Neck: No stridor. Hematological/Lymphatic/Immunilogical: No cervical lymphadenopathy. Cardiovascular: Normal rate, regular rhythm.  No murmurs, rubs, or gallops.  Respiratory: Normal respiratory effort without tachypnea nor retractions. Breath sounds are clear and equal bilaterally. No wheezes/rales/rhonchi. Gastrointestinal: Soft and non tender. No rebound. No guarding.  Genitourinary: Deferred Musculoskeletal: Normal range of motion in all extremities. No lower extremity edema. Neurologic:  Normal speech and language. No gross focal neurologic deficits are appreciated.  Skin:  Skin is warm, dry and intact. No rash noted. Psychiatric: Mood and affect are normal. Speech and behavior are normal. Patient exhibits appropriate  insight and judgment.  ____________________________________________    LABS (pertinent positives/negatives)  CBC wnl CMP k 2.4 Lipase 123  ____________________________________________   EKG  None  ____________________________________________    RADIOLOGY  None  ____________________________________________   PROCEDURES  Procedures  ____________________________________________   INITIAL IMPRESSION / ASSESSMENT AND PLAN / ED COURSE  Pertinent labs & imaging results that were available during my care of the patient were reviewed by me and considered in my medical decision making (see chart for details).  Presented to the emergency department today because of concerns for abdominal pain nausea and vomiting.  Patient does have a history of pancreatitis.  Today's lipase is mildly elevated.  She was given medication here and did feel better.  The patient was able to tolerate liquids.  I did have a discussion with the patient.  Patient felt comfortable being discharged home.  Will plan on giving patient prescription for nausea medicine and abdominal pain medicine.  Discussed with patient importance of staying away from alcohol.  ____________________________________________   FINAL CLINICAL IMPRESSION(S) / ED DIAGNOSES  Final diagnoses:  Abdominal pain, unspecified abdominal location  Elevated lipase     Note: This  dictation was prepared with Dragon dictation. Any transcriptional errors that result from this process are unintentional     Nance Pear, MD 05/18/17 [phone]

## 2017-05-17 NOTE — ED Notes (Signed)
Pt provided sips of water per EDP request.

## 2017-05-18 MED ORDER — ONDANSETRON 4 MG PO TBDP
4.0000 mg | ORAL_TABLET | Freq: Once | ORAL | Status: AC
  Administered 2017-05-18: 4 mg via ORAL
  Filled 2017-05-18: qty 1

## 2017-05-18 MED ORDER — ONDANSETRON HCL 4 MG PO TABS: 4.0000 mg | ORAL_TABLET | Freq: Three times a day (TID) | ORAL | 0 refills | Status: DC | PRN

## 2017-05-18 MED ORDER — DICYCLOMINE HCL 20 MG PO TABS: 20.0000 mg | ORAL_TABLET | Freq: Three times a day (TID) | ORAL | 0 refills | Status: DC | PRN

## 2017-05-18 NOTE — Discharge Instructions (Addendum)
As we discussed please stick to a clear liquid diet for a day or two. You can then gradually increase your food intake. As we discussed it is very important you do not drink alcohol. Please seek medical attention for any high fevers, chest pain, shortness of breath, change in behavior, persistent vomiting, bloody stool or any other new or concerning symptoms.

## 2017-11-01 ENCOUNTER — Encounter: Payer: Self-pay | Admitting: Emergency Medicine

## 2017-11-01 ENCOUNTER — Other Ambulatory Visit: Payer: Self-pay

## 2017-11-01 ENCOUNTER — Emergency Department
Admission: EM | Admit: 2017-11-01 | Discharge: 2017-11-01 | Disposition: A | Discharge status: Home / Self Care | Payer: Medicaid Other | Attending: Emergency Medicine | Admitting: Emergency Medicine

## 2017-11-01 DIAGNOSIS — F1010 Alcohol abuse, uncomplicated: Secondary | ICD-10-CM | POA: Insufficient documentation

## 2017-11-01 DIAGNOSIS — Z794 Long term (current) use of insulin: Secondary | ICD-10-CM | POA: Diagnosis not present

## 2017-11-01 DIAGNOSIS — E1165 Type 2 diabetes mellitus with hyperglycemia: Secondary | ICD-10-CM | POA: Diagnosis not present

## 2017-11-01 DIAGNOSIS — Z87891 Personal history of nicotine dependence: Secondary | ICD-10-CM | POA: Insufficient documentation

## 2017-11-01 DIAGNOSIS — K861 Other chronic pancreatitis: Secondary | ICD-10-CM | POA: Insufficient documentation

## 2017-11-01 DIAGNOSIS — N390 Urinary tract infection, site not specified: Secondary | ICD-10-CM | POA: Diagnosis not present

## 2017-11-01 DIAGNOSIS — J45909 Unspecified asthma, uncomplicated: Secondary | ICD-10-CM | POA: Insufficient documentation

## 2017-11-01 DIAGNOSIS — F329 Major depressive disorder, single episode, unspecified: Secondary | ICD-10-CM | POA: Diagnosis not present

## 2017-11-01 DIAGNOSIS — R1010 Upper abdominal pain, unspecified: Secondary | ICD-10-CM | POA: Diagnosis present

## 2017-11-01 DIAGNOSIS — I10 Essential (primary) hypertension: Secondary | ICD-10-CM | POA: Insufficient documentation

## 2017-11-01 DIAGNOSIS — Z79899 Other long term (current) drug therapy: Secondary | ICD-10-CM | POA: Diagnosis not present

## 2017-11-01 LAB — COMPREHENSIVE METABOLIC PANEL WITH GFR
ALT: 125 U/L — ABNORMAL HIGH (ref 0–44)
AST: 48 U/L — ABNORMAL HIGH (ref 15–41)
Albumin: 4.1 g/dL (ref 3.5–5.0)
Alkaline Phosphatase: 460 U/L — ABNORMAL HIGH (ref 38–126)
Anion gap: 10 (ref 5–15)
BUN: 41 mg/dL — ABNORMAL HIGH (ref 6–20)
CO2: 20 mmol/L — ABNORMAL LOW (ref 22–32)
Calcium: 8.7 mg/dL — ABNORMAL LOW (ref 8.9–10.3)
Chloride: 102 mmol/L (ref 98–111)
Creatinine, Ser: 1.22 mg/dL — ABNORMAL HIGH (ref 0.44–1.00)
GFR calc Af Amer: 55 mL/min — ABNORMAL LOW
GFR calc non Af Amer: 47 mL/min — ABNORMAL LOW
Glucose, Bld: 372 mg/dL — ABNORMAL HIGH (ref 70–99)
Potassium: 4.4 mmol/L (ref 3.5–5.1)
Sodium: 132 mmol/L — ABNORMAL LOW (ref 135–145)
Total Bilirubin: 0.5 mg/dL (ref 0.3–1.2)
Total Protein: 8.1 g/dL (ref 6.5–8.1)

## 2017-11-01 LAB — URINALYSIS, COMPLETE (UACMP) WITH MICROSCOPIC
Bilirubin Urine: NEGATIVE
Glucose, UA: >=500 mg/dL — AB
Ketones, ur: NEGATIVE mg/dL
Nitrite: NEGATIVE
Protein, ur: 30 mg/dL — AB
Specific Gravity, Urine: 1.003 — ABNORMAL LOW (ref 1.005–1.030)
WBC, UA: >50 WBC/hpf — ABNORMAL HIGH (ref 0–5)
pH: 6 (ref 5.0–8.0)

## 2017-11-01 LAB — CBC
HCT: 33.9 % — ABNORMAL LOW (ref 36.0–46.0)
Hemoglobin: 11.2 g/dL — ABNORMAL LOW (ref 12.0–15.0)
MCH: 29 pg (ref 26.0–34.0)
MCHC: 33 g/dL (ref 30.0–36.0)
MCV: 87.8 fL (ref 80.0–100.0)
NRBC: 0 % (ref 0.0–0.2)
PLATELETS: 234 10*3/uL (ref 150–400)
RBC: 3.86 MIL/uL — AB (ref 3.87–5.11)
RDW: 11.9 % (ref 11.5–15.5)
WBC: 9.7 10*3/uL (ref 4.0–10.5)

## 2017-11-01 LAB — LIPASE, BLOOD: LIPASE: 119 U/L — AB (ref 11–51)

## 2017-11-01 LAB — GLUCOSE, CAPILLARY
GLUCOSE-CAPILLARY: 316 mg/dL — AB (ref 70–99)
GLUCOSE-CAPILLARY: 108 mg/dL — AB (ref 70–99)

## 2017-11-01 MED ORDER — SODIUM CHLORIDE 0.9 % IV BOLUS
1000.0000 mL | Freq: Once | INTRAVENOUS | Status: AC
  Administered 2017-11-01: 1000 mL via INTRAVENOUS

## 2017-11-01 MED ORDER — MORPHINE SULFATE (PF) 4 MG/ML IV SOLN
4.0000 mg | Freq: Once | INTRAVENOUS | Status: AC
  Administered 2017-11-01: 4 mg via INTRAVENOUS
  Filled 2017-11-01: qty 1

## 2017-11-01 MED ORDER — LEVOFLOXACIN 500 MG PO TABS: 500.0000 mg | ORAL_TABLET | Freq: Every day | ORAL | 0 refills | Status: AC

## 2017-11-01 MED ORDER — MORPHINE SULFATE (PF) 4 MG/ML IV SOLN
4.0000 mg | Freq: Once | INTRAVENOUS | Status: AC
  Administered 2017-11-01: 4 mg via INTRAVENOUS

## 2017-11-01 MED ORDER — MORPHINE SULFATE (PF) 4 MG/ML IV SOLN
INTRAVENOUS | Status: AC
  Administered 2017-11-01: 4 mg via INTRAVENOUS
  Filled 2017-11-01: qty 1

## 2017-11-01 MED ORDER — ONDANSETRON HCL 4 MG/2ML IJ SOLN
4.0000 mg | Freq: Once | INTRAMUSCULAR | Status: AC | PRN
  Administered 2017-11-01: 4 mg via INTRAVENOUS

## 2017-11-01 MED ORDER — INSULIN ASPART 100 UNIT/ML ~~LOC~~ SOLN
4.0000 [IU] | Freq: Once | SUBCUTANEOUS | Status: AC
  Administered 2017-11-01: 4 [IU] via INTRAVENOUS
  Filled 2017-11-01: qty 1

## 2017-11-01 MED ORDER — ONDANSETRON HCL 4 MG/2ML IJ SOLN
INTRAMUSCULAR | Status: AC
  Administered 2017-11-01: 4 mg via INTRAVENOUS
  Filled 2017-11-01: qty 2

## 2017-11-01 MED ORDER — LEVOFLOXACIN IN D5W 500 MG/100ML IV SOLN
500.0000 mg | Freq: Once | INTRAVENOUS | Status: AC
  Administered 2017-11-01: 500 mg via INTRAVENOUS
  Filled 2017-11-01: qty 100

## 2017-11-01 MED ORDER — SODIUM CHLORIDE 0.9 % IV BOLUS: 1000.0000 mL | Freq: Once | INTRAVENOUS | Status: DC

## 2017-11-01 NOTE — Discharge Instructions (Addendum)
Please take antibiotics as prescribed for their entire course.  Please use Tylenol as needed for abdominal discomfort, as written on the box.  Please follow-up with your doctor within the next 2 to 3 days for recheck/reevaluation.  Return to the emergency department for any symptoms personally concerning to yourself.

## 2017-11-01 NOTE — ED Provider Notes (Signed)
[facility] Emergency Department Provider Note  Time seen: 10:02 AM  I have reviewed the triage vital signs and the nursing notes.   HISTORY  Chief Complaint Abdominal Pain and Hyperglycemia    HPI Karen [name] is a 62 y.o. female with a past medical history of pancreatitis, diabetes, hypertension, hyperlipidemia, presents to the emergency department for upper abdominal pain nausea and vomiting.  According to the patient since yesterday she has been experiencing epigastric abdominal pain severe burning type pain.  States it feels like past episodes of pancreatitis.  Patient states she has been sober for 6 months however relapsed last night drinking alcohol because of the pain per patient.  Also states nausea with a couple episodes of vomiting, 2 episodes of diarrhea.  No fever.  Describes the pain is moderate to severe currently.   Past Medical History:  Diagnosis Date  . Alcoholic pancreatitis   . Asthma   . Depression   . Diabetes mellitus without complication ([place])   . DKA (diabetic ketoacidoses) ([place]) 02/17/2015  . Emphysematous cystitis   . Hypercholesteremia   . Hypertension   . Hypokalemia     Patient Active Problem List   Diagnosis Date Noted  . Pancreatitis, acute 01/03/2017  . ETOH abuse 01/03/2017  . UTI (urinary tract infection) 01/03/2017  . Nausea and vomiting   . Esophageal candidiasis ([place])   . Intractable vomiting with nausea   . Hypertensive urgency 04/21/2016  . Abdominal pain   . Acute renal failure ([place])   . Nausea vomiting and diarrhea   . Smoker   . Poorly controlled type 2 diabetes mellitus ([place])   . Acute renal insufficiency 10/29/2015  . Elevated troponin 10/29/2015  . Urinary tract infection without hematuria 10/29/2015  . Left flank pain 10/29/2015  . Malignant essential hypertension 10/29/2015  . DKA (diabetic ketoacidoses) ([place]) 02/17/2015  . Emphysematous cystitis     Past Surgical History:  Procedure  Laterality Date  . ABDOMINAL HYSTERECTOMY  1996  . APPENDECTOMY  1997  . ESOPHAGOGASTRODUODENOSCOPY (EGD) WITH PROPOFOL N/A 04/24/2016   Procedure: ESOPHAGOGASTRODUODENOSCOPY (EGD) WITH PROPOFOL;  Surgeon: [name], MD;  Location: [facility] ENDOSCOPY;  Service: Endoscopy;  Laterality: N/A;  . HAND SURGERY  1988  . THYROID SURGERY  2013    Prior to Admission medications   Medication Sig Start Date End Date Taking? Authorizing Provider  atorvastatin (LIPITOR) 10 MG tablet Take 10 mg by mouth at bedtime.    [provider]  atorvastatin (LIPITOR) 40 MG tablet Take 1 tablet (40 mg total) by mouth daily at 6 PM. Patient not taking: Reported on 04/25/2017 12/12/16   Demetrios Loll, MD  cyclobenzaprine (FLEXERIL) 5 MG tablet Take 1 tablet (5 mg total) by mouth 2 (two) times daily. 01/07/17   Dustin Flock, MD  dicyclomine (BENTYL) 20 MG tablet Take 1 tablet (20 mg total) by mouth 3 (three) times daily as needed (abdominal pain). 05/18/17   Nance Pear, MD  diphenhydrAMINE (BENADRYL) 25 MG tablet Take 25 mg by mouth every 6 (six) hours as needed.    [provider]  flurazepam (DALMANE) 15 MG capsule Take 1 capsule (15 mg total) by mouth at bedtime as needed for sleep. 01/07/17   Dustin Flock, MD  hydrALAZINE (APRESOLINE) 25 MG tablet Take 1 tablet (25 mg total) by mouth every 8 (eight) hours. 01/07/17   Dustin Flock, MD  HYDROcodone-acetaminophen (NORCO) 5-325 MG tablet Take 1 tablet by mouth every 6 (six) hours as needed for  up to 15 doses for severe pain. 01/07/17   Dustin Flock, MD  insulin aspart protamine - aspart (NOVOLOG MIX 70/30 FLEXPEN) (70-30) 100 UNIT/ML FlexPen Inject 15 Units into the skin 2 (two) times daily.    [provider]  ondansetron (ZOFRAN ODT) 4 MG disintegrating tablet Take 1 tablet (4 mg total) by mouth every 6 (six) hours as needed for nausea or vomiting. Patient not taking: Reported on 04/25/2017 01/29/17   Delman Kitten, MD  ondansetron (ZOFRAN) 4 MG  tablet Take 1 tablet (4 mg total) by mouth every 8 (eight) hours as needed for nausea or vomiting. 05/18/17   Nance Pear, MD  GLUCERNA [facility]) LIQD Take 237 mLs by mouth 3 (three) times daily between meals. 02/19/15 02/27/15  Bettey Costa, MD    Allergies  Allergen Reactions  . Penicillins Anaphylaxis    Has patient had a PCN reaction causing immediate rash, facial/tongue/throat swelling, SOB or lightheadedness with hypotension: Yes Has patient had a PCN reaction causing severe rash involving mucus membranes or skin necrosis: No Has patient had a PCN reaction that required hospitalization No Has patient had a PCN reaction occurring within the last 10 years: No If all of the above answers are "NO", then may proceed with Cephalosporin use.  . Reglan [Metoclopramide] Other (See Comments)    Hypotension, shortness of breath  . Percocet [Oxycodone-Acetaminophen] Rash    Family History  Family history unknown: Yes    Social History Social History   Tobacco Use  . Smoking status: Former Research scientist (life sciences)  . Smokeless tobacco: Never Used  Substance Use Topics  . Alcohol use: Yes    Comment: 40oz/day  . Drug use: No    Review of Systems Constitutional: Negative for fever. Cardiovascular: Negative for chest pain. Respiratory: Negative for shortness of breath. Gastrointestinal: Upper abdominal pain/burning.  Positive for nausea vomiting diarrhea. Genitourinary: Negative for urinary compaints Musculoskeletal: Negative for musculoskeletal complaints Skin: Negative for skin complaints  Neurological: Negative for headache All other ROS negative  ____________________________________________   PHYSICAL EXAM:  VITAL SIGNS: ED Triage Vitals  Enc Vitals Group     BP 11/01/17 0939 138/83     Pulse Rate 11/01/17 0939 78     Resp 11/01/17 0939 16     Temp 11/01/17 0939 98.7 F (37.1 C)     Temp Source 11/01/17 0939 Oral     SpO2 11/01/17 0939 100 %     Weight 11/01/17 0940 100 lb (45.4  kg)     Height 11/01/17 0940 5\' 1"  (1.549 m)     Head Circumference --      Peak Flow --      Pain Score 11/01/17 0939 10     Pain Loc --      Pain Edu? --      Excl. in [place]? --     Constitutional: Alert and oriented. Well appearing and in no distress. Eyes: Normal exam ENT   Head: Normocephalic and atraumatic.   Mouth/Throat: Mucous membranes are moist. Cardiovascular: Normal rate, regular rhythm. No murmur Respiratory: Normal respiratory effort without tachypnea nor retractions. Breath sounds are clear Gastrointestinal: Soft, moderate epigastric tenderness palpation.  No rebound guarding or distention. Musculoskeletal: Nontender with normal range of motion in all extremities.  Neurologic:  Normal speech and language. No gross focal neurologic deficits Skin:  Skin is warm, dry and intact.  Psychiatric: Tearful at times during examination.  Seems upset regarding her recent relapse.  ____________________________________________    EKG reviewed and interpreted  by myself shows a normal sinus rhythm 84 bpm with a narrow QRS, normal axis, largely normal intervals with no concerning ST changes.   INITIAL IMPRESSION / ASSESSMENT AND PLAN / ED COURSE  Pertinent labs & imaging results that were available during my care of the patient were reviewed by me and considered in my medical decision making (see chart for details).  Patient presents to the emergency department for epigastric pain since yesterday worsening last night into today.  Positive for nausea vomiting.  History of alcoholic pancreatitis in the past.  Tenderness across the upper abdomen.  Patient also states this morning her blood glucose was elevated greater than 600 and she felt lightheaded.  Differential this time would include pancreatitis, alcohol induced pancreatitis, biliary colic or cholecystitis, gastritis, gastroenteritis, esophagitis, DKA, hyperglycemia.  We will check labs, IV hydrate, treat pain and nausea while  awaiting lab results.  Patient agreeable to plan of care.  Patient's labs are resulted showing an elevated blood glucose but a normal anion gap.  Mildly elevated LFTs as well.  Urinalysis appears most consistent with a urinary tract infection.  Patient's lipase is slightly elevated however in reviewing the patient's records it is always slightly elevated consistent with likely chronic pancreatitis.  Patient is prescribed pain medication at home for chronic abdominal pain although states she ran out of her prescribed hydrocodone.  I looked the patient up in the narcotic database, patient received a 30-day prescription of hydrocodone 10/07/2017 but states she dropped pills down the sink, they will not refill her new pain medication until Thursday.  At this time the patient symptoms appear to be due to her chronic abdominal pain possibly due to pancreatitis.  She does have a urinary tract infection received IV Levaquin in the emergency department a urine culture has been sent.  We will discharge on p.o. Levaquin given the patient's allergies.  We will have her follow-up with her primary care doctor.  Patient agreeable to plan of care.  After IV fluids and 4 units of insulin patient's blood glucose is down to 108.  Patient will be allowed to eat and drink we will discharge with PCP follow-up.  Patient agreeable to plan.    ____________________________________________   FINAL CLINICAL IMPRESSION(S) / ED DIAGNOSES  Chronic pancreatitis Hyperglycemia    Harvest Dark, MD 11/01/17 1328

## 2017-11-01 NOTE — ED Triage Notes (Signed)
Pt here via EMS from home with c/o hyperglycemia and LUQ abd pain that began yesterday, has a hx of pancreatitis, states she became sweaty, dizzy and felt like she was going to pass out at home before she called EMS, her sugar was >600, she gave herself 15U insulin, bg 468 upon EMS arrival, 368 at this time. Pt states she is nauseated. Tearful upon initial assessment, states sober for the past 6 months, however drank last night and this am thinking it would help the pain.

## 2017-11-01 NOTE — ED Notes (Signed)

## 2017-11-03 LAB — URINE CULTURE

## 2017-11-08 ENCOUNTER — Other Ambulatory Visit: Payer: Self-pay

## 2017-11-08 ENCOUNTER — Emergency Department: Payer: Medicaid Other

## 2017-11-08 ENCOUNTER — Emergency Department
Admission: EM | Admit: 2017-11-08 | Discharge: 2017-11-08 | Disposition: A | Discharge status: Home / Self Care | Payer: Medicaid Other | Attending: Emergency Medicine | Admitting: Emergency Medicine

## 2017-11-08 ENCOUNTER — Encounter: Payer: Self-pay | Admitting: Emergency Medicine

## 2017-11-08 DIAGNOSIS — Z87891 Personal history of nicotine dependence: Secondary | ICD-10-CM | POA: Insufficient documentation

## 2017-11-08 DIAGNOSIS — Z79899 Other long term (current) drug therapy: Secondary | ICD-10-CM | POA: Insufficient documentation

## 2017-11-08 DIAGNOSIS — J45909 Unspecified asthma, uncomplicated: Secondary | ICD-10-CM | POA: Diagnosis not present

## 2017-11-08 DIAGNOSIS — R079 Chest pain, unspecified: Secondary | ICD-10-CM | POA: Diagnosis present

## 2017-11-08 DIAGNOSIS — I10 Essential (primary) hypertension: Secondary | ICD-10-CM | POA: Insufficient documentation

## 2017-11-08 DIAGNOSIS — Z794 Long term (current) use of insulin: Secondary | ICD-10-CM | POA: Insufficient documentation

## 2017-11-08 DIAGNOSIS — E119 Type 2 diabetes mellitus without complications: Secondary | ICD-10-CM | POA: Insufficient documentation

## 2017-11-08 DIAGNOSIS — M5412 Radiculopathy, cervical region: Secondary | ICD-10-CM | POA: Diagnosis not present

## 2017-11-08 LAB — CBC
HCT: 32.9 % — ABNORMAL LOW (ref 36.0–46.0)
HEMOGLOBIN: 11.2 g/dL — AB (ref 12.0–15.0)
MCH: 29.6 pg (ref 26.0–34.0)
MCHC: 34 g/dL (ref 30.0–36.0)
MCV: 86.8 fL (ref 80.0–100.0)
Platelets: 229 10*3/uL (ref 150–400)
RBC: 3.79 MIL/uL — ABNORMAL LOW (ref 3.87–5.11)
RDW: 12.1 % (ref 11.5–15.5)
WBC: 9.2 10*3/uL (ref 4.0–10.5)
nRBC: 0 % (ref 0.0–0.2)

## 2017-11-08 LAB — BASIC METABOLIC PANEL
ANION GAP: 11 (ref 5–15)
BUN: 23 mg/dL — AB (ref 6–20)
CALCIUM: 9.2 mg/dL (ref 8.9–10.3)
CO2: 26 mmol/L (ref 22–32)
Chloride: 97 mmol/L — ABNORMAL LOW (ref 98–111)
Creatinine, Ser: 1.13 mg/dL — ABNORMAL HIGH (ref 0.44–1.00)
GFR calc Af Amer: 60 mL/min — ABNORMAL LOW (ref >60)
GFR, EST NON AFRICAN AMERICAN: 52 mL/min — AB (ref >60)
GLUCOSE: 179 mg/dL — AB (ref 70–99)
POTASSIUM: 3.6 mmol/L (ref 3.5–5.1)
Sodium: 134 mmol/L — ABNORMAL LOW (ref 135–145)

## 2017-11-08 LAB — TROPONIN I

## 2017-11-08 MED ORDER — ONDANSETRON 4 MG PO TBDP
ORAL_TABLET | ORAL | Status: AC
  Filled 2017-11-08: qty 1

## 2017-11-08 MED ORDER — NAPROXEN 500 MG PO TABS: 500.0000 mg | ORAL_TABLET | Freq: Two times a day (BID) | ORAL | 2 refills | Status: DC

## 2017-11-08 MED ORDER — PREDNISONE 20 MG PO TABS
60.0000 mg | ORAL_TABLET | Freq: Once | ORAL | Status: AC
  Administered 2017-11-08: 60 mg via ORAL
  Filled 2017-11-08: qty 3

## 2017-11-08 MED ORDER — PREDNISONE 50 MG PO TABS: 50.0000 mg | ORAL_TABLET | Freq: Every day | ORAL | 0 refills | Status: DC

## 2017-11-08 MED ORDER — ONDANSETRON 4 MG PO TBDP
4.0000 mg | ORAL_TABLET | Freq: Once | ORAL | Status: AC | PRN
  Administered 2017-11-08: 4 mg via ORAL

## 2017-11-08 MED ORDER — KETOROLAC TROMETHAMINE 30 MG/ML IJ SOLN
30.0000 mg | Freq: Once | INTRAMUSCULAR | Status: AC
  Administered 2017-11-08: 30 mg via INTRAMUSCULAR
  Filled 2017-11-08: qty 1

## 2017-11-08 NOTE — ED Triage Notes (Signed)
Says hit forehead on door yesterday by accident.  Did not have loc.  Says her head still hurts.

## 2017-11-08 NOTE — ED Notes (Signed)

## 2017-11-08 NOTE — ED Provider Notes (Addendum)
[facility] Emergency Department Provider Note   ____________________________________________    I have reviewed the triage vital signs and the nursing notes.   HISTORY  Chief Complaint Chest Pain     HPI Karen [name] is a 62 y.o. female who presents with complaints of left arm pain.  Patient reports she is feeling sharp pains traveling down from her neck to her middle fingers.  She denies weakness.  No fall or injury.  No nausea or vomiting.  No chest pain despite nurse's note.  No shortness of breath or pleurisy.  No swelling of the arm.  No pallor of the arm.  Past Medical History:  Diagnosis Date  . Alcoholic pancreatitis   . Asthma   . Depression   . Diabetes mellitus without complication ([place])   . DKA (diabetic ketoacidoses) ([place]) 02/17/2015  . Emphysematous cystitis   . Hypercholesteremia   . Hypertension   . Hypokalemia     Patient Active Problem List   Diagnosis Date Noted  . Pancreatitis, acute 01/03/2017  . ETOH abuse 01/03/2017  . UTI (urinary tract infection) 01/03/2017  . Nausea and vomiting   . Esophageal candidiasis ([place])   . Intractable vomiting with nausea   . Hypertensive urgency 04/21/2016  . Abdominal pain   . Acute renal failure ([place])   . Nausea vomiting and diarrhea   . Smoker   . Poorly controlled type 2 diabetes mellitus ([place])   . Acute renal insufficiency 10/29/2015  . Elevated troponin 10/29/2015  . Urinary tract infection without hematuria 10/29/2015  . Left flank pain 10/29/2015  . Malignant essential hypertension 10/29/2015  . DKA (diabetic ketoacidoses) ([place]) 02/17/2015  . Emphysematous cystitis     Past Surgical History:  Procedure Laterality Date  . ABDOMINAL HYSTERECTOMY  1996  . APPENDECTOMY  1997  . ESOPHAGOGASTRODUODENOSCOPY (EGD) WITH PROPOFOL N/A 04/24/2016   Procedure: ESOPHAGOGASTRODUODENOSCOPY (EGD) WITH PROPOFOL;  Surgeon: [name], MD;  Location: [facility] ENDOSCOPY;  Service: Endoscopy;   Laterality: N/A;  . HAND SURGERY  1988  . THYROID SURGERY  2013    Prior to Admission medications   Medication Sig Start Date End Date Taking? Authorizing Provider  amLODipine (NORVASC) 5 MG tablet Take 5 mg by mouth daily.    [provider]  atorvastatin (LIPITOR) 10 MG tablet Take 10 mg by mouth at bedtime.    [provider]  cyclobenzaprine (FLEXERIL) 5 MG tablet Take 1 tablet (5 mg total) by mouth 2 (two) times daily. Patient taking differently: Take 5 mg by mouth 2 (two) times daily as needed for muscle spasms.  01/07/17   Dustin Flock, MD  diphenhydrAMINE (BENADRYL) 25 MG tablet Take 25 mg by mouth every 6 (six) hours as needed for itching.     [provider]  flurazepam (DALMANE) 15 MG capsule Take 1 capsule (15 mg total) by mouth at bedtime as needed for sleep. 01/07/17   Dustin Flock, MD  HYDROcodone-acetaminophen (NORCO) 5-325 MG tablet Take 1 tablet by mouth every 6 (six) hours as needed for up to 15 doses for severe pain. 01/07/17   Dustin Flock, MD  insulin aspart protamine - aspart (NOVOLOG MIX 70/30 FLEXPEN) (70-30) 100 UNIT/ML FlexPen Inject 15 Units into the skin 2 (two) times daily.    [provider]  levofloxacin (LEVAQUIN) 500 MG tablet Take 1 tablet (500 mg total) by mouth daily for 7 days. 11/01/17 11/08/17  Harvest Dark, MD  naproxen (NAPROSYN) 500 MG tablet Take 1  tablet (500 mg total) by mouth 2 (two) times daily with a meal. 11/08/17   Lavonia Drafts, MD  ondansetron (ZOFRAN) 4 MG tablet Take 1 tablet (4 mg total) by mouth every 8 (eight) hours as needed for nausea or vomiting. 05/18/17   Nance Pear, MD  predniSONE (DELTASONE) 50 MG tablet Take 1 tablet (50 mg total) by mouth daily with breakfast. 11/08/17   Lavonia Drafts, MD  GLUCERNA (GLUCERNA) LIQD Take 237 mLs by mouth 3 (three) times daily between meals. 02/19/15 02/27/15  Bettey Costa, MD     Allergies Penicillins; Reglan [metoclopramide]; and Percocet  [oxycodone-acetaminophen]  Family History  Family history unknown: Yes    Social History Social History   Tobacco Use  . Smoking status: Former Research scientist (life sciences)  . Smokeless tobacco: Never Used  Substance Use Topics  . Alcohol use: Yes    Comment: 40oz/day  . Drug use: No    Review of Systems  Constitutional: No fever/chills Eyes: No visual changes.  ENT: Occasional neck pain Cardiovascular: Denies chest pain. Respiratory: Denies shortness of breath. Gastrointestinal: No abdominal pain.   Genitourinary: Negative for dysuria. Musculoskeletal: As above Skin: Negative for rash. Neurological: Negative for headaches, no weakness   ____________________________________________   PHYSICAL EXAM:  VITAL SIGNS: ED Triage Vitals  Enc Vitals Group     BP 11/08/17 1456 (S) (!) 197/83     Pulse Rate 11/08/17 1456 78     Resp 11/08/17 1456 18     Temp 11/08/17 1456 97.7 F (36.5 C)     Temp Source 11/08/17 1456 Oral     SpO2 11/08/17 1456 100 %     Weight 11/08/17 1453 45.4 kg (100 lb)     Height 11/08/17 1453 1.549 m (5\' 1" )     Head Circumference --      Peak Flow --      Pain Score 11/08/17 1453 10     Pain Loc --      Pain Edu? --      Excl. in [place]? --     Constitutional: Alert and oriented. No acute distress. Pleasant and interactive  Nose: No congestion/rhinnorhea. Mouth/Throat: Mucous membranes are moist.   Neck: No vertebral tenderness to palpation Cardiovascular: Normal rate, regular rhythm. Grossly normal heart sounds.  Good peripheral circulation. Respiratory: Normal respiratory effort.  No retractions. Lungs CTAB. Gastrointestinal: Soft and nontender. No distention.  No CVA tenderness.  Musculoskeletal: Left upper extremity: No swelling, compartment soft, 2+ pulses, warm and well-perfused.  Normal strength.  Able to cause pain by pulling on her left arm Neurologic:  Normal speech and language. No gross focal neurologic deficits are appreciated.  Skin:  Skin is  warm, dry and intact. No rash noted. Psychiatric: Mood and affect are normal. Speech and behavior are normal.  ____________________________________________   LABS (all labs ordered are listed, but only abnormal results are displayed)  Labs Reviewed  BASIC METABOLIC PANEL - Abnormal; Notable for the following components:      Result Value   Sodium 134 (*)    Chloride 97 (*)    Glucose, Bld 179 (*)    BUN 23 (*)    Creatinine, Ser 1.13 (*)    GFR calc non Af Amer 52 (*)    GFR calc Af Amer 60 (*)    All other components within normal limits  CBC - Abnormal; Notable for the following components:   RBC 3.79 (*)    Hemoglobin 11.2 (*)    HCT 32.9 (*)  All other components within normal limits  TROPONIN I   ____________________________________________  EKG  ED ECG REPORT I, Lavonia Drafts, the attending physician, personally viewed and interpreted this ECG.  Date: 12/02/2017  Rhythm: normal sinus rhythm QRS Axis: normal Intervals: normal ST/T Wave abnormalities: normal Narrative Interpretation: no evidence of acute ischemia  ____________________________________________  RADIOLOGY  Chest x-ray unremarkable ____________________________________________   PROCEDURES  Procedure(s) performed: No  Procedures   Critical Care performed: No ____________________________________________   INITIAL IMPRESSION / ASSESSMENT AND PLAN / ED COURSE  Pertinent labs & imaging results that were available during my care of the patient were reviewed by me and considered in my medical decision making (see chart for details).  Patient well-appearing in no acute distress.  Symptoms appear to be consistent with cervical radiculopathy, this is further supported by exam.  No swelling to suggest DVT.  Warm and well-perfused.  Compartments are soft.  No bruising or evidence of trauma.  Will treat with Toradol, steroids, outpatient follow-up      ____________________________________________   FINAL CLINICAL IMPRESSION(S) / ED DIAGNOSES  Final diagnoses:  Cervical radiculopathy        Note:  This document was prepared using Dragon voice recognition software and may include unintentional dictation errors.    Lavonia Drafts, MD 11/08/17 Waymond Cera    Lavonia Drafts, MD 12/02/17 [phone]

## 2017-11-08 NOTE — ED Triage Notes (Signed)
Pt arrived via POV with reports of chest pain for a couple of days, pt states the pain is in the left side of the chest radiating down left arm and to left side of the neck and back.  Pt states she has trouble with high BP and c/o headache related to BP.

## 2017-11-11 ENCOUNTER — Encounter: Payer: Self-pay | Admitting: Emergency Medicine

## 2017-11-11 ENCOUNTER — Emergency Department
Admission: EM | Admit: 2017-11-11 | Discharge: 2017-11-11 | Disposition: A | Discharge status: Home / Self Care | Payer: Medicaid Other | Source: Home / Self Care | Attending: Emergency Medicine | Admitting: Emergency Medicine

## 2017-11-11 ENCOUNTER — Other Ambulatory Visit: Payer: Self-pay

## 2017-11-11 ENCOUNTER — Inpatient Hospital Stay
Admission: EM | Admit: 2017-11-11 | Discharge: 2017-11-13 | DRG: 377 | Disposition: A | Discharge status: Home / Self Care | Payer: Medicaid Other | Attending: Internal Medicine | Admitting: Internal Medicine

## 2017-11-11 DIAGNOSIS — K8500 Idiopathic acute pancreatitis without necrosis or infection: Secondary | ICD-10-CM | POA: Diagnosis present

## 2017-11-11 DIAGNOSIS — E1165 Type 2 diabetes mellitus with hyperglycemia: Secondary | ICD-10-CM | POA: Diagnosis present

## 2017-11-11 DIAGNOSIS — E119 Type 2 diabetes mellitus without complications: Secondary | ICD-10-CM | POA: Diagnosis present

## 2017-11-11 DIAGNOSIS — Z23 Encounter for immunization: Secondary | ICD-10-CM | POA: Diagnosis not present

## 2017-11-11 DIAGNOSIS — I10 Essential (primary) hypertension: Secondary | ICD-10-CM

## 2017-11-11 DIAGNOSIS — Z794 Long term (current) use of insulin: Secondary | ICD-10-CM

## 2017-11-11 DIAGNOSIS — T380X5A Adverse effect of glucocorticoids and synthetic analogues, initial encounter: Secondary | ICD-10-CM | POA: Diagnosis present

## 2017-11-11 DIAGNOSIS — G894 Chronic pain syndrome: Secondary | ICD-10-CM | POA: Diagnosis present

## 2017-11-11 DIAGNOSIS — K2971 Gastritis, unspecified, with bleeding: Principal | ICD-10-CM | POA: Diagnosis present

## 2017-11-11 DIAGNOSIS — T39315A Adverse effect of propionic acid derivatives, initial encounter: Secondary | ICD-10-CM | POA: Diagnosis present

## 2017-11-11 DIAGNOSIS — K922 Gastrointestinal hemorrhage, unspecified: Secondary | ICD-10-CM | POA: Diagnosis present

## 2017-11-11 DIAGNOSIS — Z888 Allergy status to other drugs, medicaments and biological substances status: Secondary | ICD-10-CM | POA: Diagnosis not present

## 2017-11-11 DIAGNOSIS — Z885 Allergy status to narcotic agent status: Secondary | ICD-10-CM | POA: Diagnosis not present

## 2017-11-11 DIAGNOSIS — I700 Atherosclerosis of aorta: Secondary | ICD-10-CM | POA: Diagnosis present

## 2017-11-11 DIAGNOSIS — Z90710 Acquired absence of both cervix and uterus: Secondary | ICD-10-CM

## 2017-11-11 DIAGNOSIS — Z87891 Personal history of nicotine dependence: Secondary | ICD-10-CM | POA: Diagnosis not present

## 2017-11-11 DIAGNOSIS — R1013 Epigastric pain: Secondary | ICD-10-CM | POA: Diagnosis present

## 2017-11-11 DIAGNOSIS — E7800 Pure hypercholesterolemia, unspecified: Secondary | ICD-10-CM | POA: Diagnosis present

## 2017-11-11 DIAGNOSIS — R55 Syncope and collapse: Secondary | ICD-10-CM | POA: Diagnosis not present

## 2017-11-11 DIAGNOSIS — R739 Hyperglycemia, unspecified: Secondary | ICD-10-CM

## 2017-11-11 DIAGNOSIS — Z880 Allergy status to penicillin: Secondary | ICD-10-CM

## 2017-11-11 DIAGNOSIS — Z9114 Patient's other noncompliance with medication regimen: Secondary | ICD-10-CM | POA: Diagnosis not present

## 2017-11-11 DIAGNOSIS — E876 Hypokalemia: Secondary | ICD-10-CM | POA: Diagnosis present

## 2017-11-11 DIAGNOSIS — J45909 Unspecified asthma, uncomplicated: Secondary | ICD-10-CM

## 2017-11-11 DIAGNOSIS — I248 Other forms of acute ischemic heart disease: Secondary | ICD-10-CM | POA: Diagnosis not present

## 2017-11-11 DIAGNOSIS — F1010 Alcohol abuse, uncomplicated: Secondary | ICD-10-CM | POA: Insufficient documentation

## 2017-11-11 DIAGNOSIS — T50905A Adverse effect of unspecified drugs, medicaments and biological substances, initial encounter: Secondary | ICD-10-CM

## 2017-11-11 DIAGNOSIS — K920 Hematemesis: Secondary | ICD-10-CM | POA: Diagnosis not present

## 2017-11-11 HISTORY — DX: Gastrointestinal hemorrhage, unspecified: K92.2

## 2017-11-11 LAB — URINALYSIS, COMPLETE (UACMP) WITH MICROSCOPIC
BILIRUBIN URINE: NEGATIVE
Ketones, ur: 20 mg/dL — AB
NITRITE: NEGATIVE
PROTEIN: 100 mg/dL — AB
Specific Gravity, Urine: 1.013 (ref 1.005–1.030)
pH: 7 (ref 5.0–8.0)

## 2017-11-11 LAB — COMPREHENSIVE METABOLIC PANEL
ALBUMIN: 4.1 g/dL (ref 3.5–5.0)
ALBUMIN: 3.9 g/dL (ref 3.5–5.0)
ALK PHOS: 249 U/L — AB (ref 38–126)
ALT: 29 U/L (ref 0–44)
ALT: 25 U/L (ref 0–44)
ANION GAP: 13 (ref 5–15)
ANION GAP: 15 (ref 5–15)
AST: 26 U/L (ref 15–41)
AST: 15 U/L (ref 15–41)
Alkaline Phosphatase: 226 U/L — ABNORMAL HIGH (ref 38–126)
BILIRUBIN TOTAL: 1.5 mg/dL — AB (ref 0.3–1.2)
BUN: 34 mg/dL — ABNORMAL HIGH (ref 6–20)
BUN: 29 mg/dL — AB (ref 6–20)
CALCIUM: 8.6 mg/dL — AB (ref 8.9–10.3)
CHLORIDE: 99 mmol/L (ref 98–111)
CO2: 27 mmol/L (ref 22–32)
CO2: 23 mmol/L (ref 22–32)
Calcium: 8.4 mg/dL — ABNORMAL LOW (ref 8.9–10.3)
Chloride: 97 mmol/L — ABNORMAL LOW (ref 98–111)
Creatinine, Ser: 1.11 mg/dL — ABNORMAL HIGH (ref 0.44–1.00)
Creatinine, Ser: 1.03 mg/dL — ABNORMAL HIGH (ref 0.44–1.00)
GFR calc Af Amer: >60 mL/min (ref >60)
GFR calc non Af Amer: 53 mL/min — ABNORMAL LOW (ref >60)
GFR calc non Af Amer: 58 mL/min — ABNORMAL LOW (ref >60)
GLUCOSE: 298 mg/dL — AB (ref 70–99)
GLUCOSE: 330 mg/dL — AB (ref 70–99)
POTASSIUM: 4 mmol/L (ref 3.5–5.1)
POTASSIUM: 3.3 mmol/L — AB (ref 3.5–5.1)
SODIUM: 139 mmol/L (ref 135–145)
SODIUM: 135 mmol/L (ref 135–145)
TOTAL PROTEIN: 7.4 g/dL (ref 6.5–8.1)
Total Bilirubin: 1.4 mg/dL — ABNORMAL HIGH (ref 0.3–1.2)
Total Protein: 8.1 g/dL (ref 6.5–8.1)

## 2017-11-11 LAB — CBC
HCT: 38.3 % (ref 36.0–46.0)
HCT: 36.5 % (ref 36.0–46.0)
HEMOGLOBIN: 13.3 g/dL (ref 12.0–15.0)
Hemoglobin: 12.3 g/dL (ref 12.0–15.0)
MCH: 29.6 pg (ref 26.0–34.0)
MCH: 29.4 pg (ref 26.0–34.0)
MCHC: 34.7 g/dL (ref 30.0–36.0)
MCHC: 33.7 g/dL (ref 30.0–36.0)
MCV: 85.1 fL (ref 80.0–100.0)
MCV: 87.3 fL (ref 80.0–100.0)
PLATELETS: 267 10*3/uL (ref 150–400)
PLATELETS: 256 10*3/uL (ref 150–400)
RBC: 4.5 MIL/uL (ref 3.87–5.11)
RBC: 4.18 MIL/uL (ref 3.87–5.11)
RDW: 12.4 % (ref 11.5–15.5)
RDW: 12.4 % (ref 11.5–15.5)
WBC: 9.5 10*3/uL (ref 4.0–10.5)
WBC: 11.3 10*3/uL — ABNORMAL HIGH (ref 4.0–10.5)
nRBC: 0 % (ref 0.0–0.2)
nRBC: 0 % (ref 0.0–0.2)

## 2017-11-11 LAB — GLUCOSE, CAPILLARY
GLUCOSE-CAPILLARY: 305 mg/dL — AB (ref 70–99)
GLUCOSE-CAPILLARY: 245 mg/dL — AB (ref 70–99)
Glucose-Capillary: 272 mg/dL — ABNORMAL HIGH (ref 70–99)
Glucose-Capillary: 207 mg/dL — ABNORMAL HIGH (ref 70–99)

## 2017-11-11 LAB — CK: Total CK: 47 U/L (ref 38–234)

## 2017-11-11 LAB — TROPONIN I
Troponin I: 0.1 ng/mL (ref <0.03)
Troponin I: 0.11 ng/mL (ref <0.03)

## 2017-11-11 LAB — LIPASE, BLOOD
LIPASE: 62 U/L — AB (ref 11–51)
LIPASE: 64 U/L — AB (ref 11–51)

## 2017-11-11 LAB — ETHANOL: Alcohol, Ethyl (B): <10 mg/dL (ref <10)

## 2017-11-11 LAB — TYPE AND SCREEN
ABO/RH(D): B POS
ANTIBODY SCREEN: NEGATIVE

## 2017-11-11 LAB — HEMOGLOBIN: Hemoglobin: 12.3 g/dL (ref 12.0–15.0)

## 2017-11-11 MED ORDER — INSULIN DETEMIR 100 UNIT/ML ~~LOC~~ SOLN
10.0000 [IU] | Freq: Every day | SUBCUTANEOUS | Status: DC
  Administered 2017-11-11 – 2017-11-12 (×2): 10 [IU] via SUBCUTANEOUS
  Filled 2017-11-11 (×3): qty 0.1

## 2017-11-11 MED ORDER — SODIUM CHLORIDE 0.9 % IV SOLN
8.0000 mg/h | INTRAVENOUS | Status: DC
  Administered 2017-11-11 – 2017-11-13 (×4): 8 mg/h via INTRAVENOUS
  Filled 2017-11-11 (×5): qty 80

## 2017-11-11 MED ORDER — ONDANSETRON HCL 4 MG/2ML IJ SOLN
4.0000 mg | Freq: Once | INTRAMUSCULAR | Status: AC
  Administered 2017-11-11: 4 mg via INTRAVENOUS
  Filled 2017-11-11: qty 2

## 2017-11-11 MED ORDER — INSULIN DETEMIR 100 UNIT/ML ~~LOC~~ SOLN: 12.0000 [IU] | Freq: Every day | SUBCUTANEOUS | Status: DC

## 2017-11-11 MED ORDER — SODIUM CHLORIDE 0.9 % IV BOLUS
1000.0000 mL | Freq: Once | INTRAVENOUS | Status: AC
  Administered 2017-11-11: 1000 mL via INTRAVENOUS

## 2017-11-11 MED ORDER — POLYETHYLENE GLYCOL 3350 17 G PO PACK: 17.0000 g | PACK | Freq: Every day | ORAL | Status: DC | PRN

## 2017-11-11 MED ORDER — KETOROLAC TROMETHAMINE 30 MG/ML IJ SOLN
30.0000 mg | Freq: Once | INTRAMUSCULAR | Status: AC
  Administered 2017-11-11: 30 mg via INTRAVENOUS
  Filled 2017-11-11: qty 1

## 2017-11-11 MED ORDER — SODIUM CHLORIDE 0.9 % IV SOLN
50.0000 ug/h | INTRAVENOUS | Status: DC
  Administered 2017-11-11: 50 ug/h via INTRAVENOUS
  Filled 2017-11-11 (×6): qty 1

## 2017-11-11 MED ORDER — SODIUM CHLORIDE 0.9 % IV SOLN
80.0000 mg | Freq: Once | INTRAVENOUS | Status: AC
  Administered 2017-11-11: 80 mg via INTRAVENOUS
  Filled 2017-11-11: qty 80

## 2017-11-11 MED ORDER — ONDANSETRON 4 MG PO TBDP: 4.0000 mg | ORAL_TABLET | Freq: Three times a day (TID) | ORAL | 0 refills | Status: DC | PRN

## 2017-11-11 MED ORDER — HYDRALAZINE HCL 20 MG/ML IJ SOLN
20.0000 mg | INTRAMUSCULAR | Status: DC | PRN
  Administered 2017-11-11 – 2017-11-12 (×2): 20 mg via INTRAVENOUS
  Filled 2017-11-11 (×2): qty 1

## 2017-11-11 MED ORDER — LORAZEPAM 2 MG/ML IJ SOLN: 1.0000 mg | Freq: Four times a day (QID) | INTRAMUSCULAR | Status: DC | PRN

## 2017-11-11 MED ORDER — THIAMINE HCL 100 MG/ML IJ SOLN: 100.0000 mg | Freq: Every day | INTRAMUSCULAR | Status: DC

## 2017-11-11 MED ORDER — ACETAMINOPHEN 650 MG RE SUPP: 650.0000 mg | Freq: Four times a day (QID) | RECTAL | Status: DC | PRN

## 2017-11-11 MED ORDER — FOLIC ACID 1 MG PO TABS
1.0000 mg | ORAL_TABLET | Freq: Every day | ORAL | Status: DC
  Administered 2017-11-11 – 2017-11-13 (×3): 1 mg via ORAL
  Filled 2017-11-11 (×3): qty 1

## 2017-11-11 MED ORDER — ALBUTEROL SULFATE (2.5 MG/3ML) 0.083% IN NEBU: 2.5000 mg | INHALATION_SOLUTION | RESPIRATORY_TRACT | Status: DC | PRN

## 2017-11-11 MED ORDER — ADULT MULTIVITAMIN W/MINERALS CH
1.0000 | ORAL_TABLET | Freq: Every day | ORAL | Status: DC
  Administered 2017-11-11 – 2017-11-13 (×3): 1 via ORAL
  Filled 2017-11-11 (×3): qty 1

## 2017-11-11 MED ORDER — POTASSIUM CHLORIDE IN NACL 20-0.9 MEQ/L-% IV SOLN
INTRAVENOUS | Status: DC
  Administered 2017-11-11 – 2017-11-12 (×2): via INTRAVENOUS
  Filled 2017-11-11 (×5): qty 1000

## 2017-11-11 MED ORDER — PANTOPRAZOLE SODIUM 40 MG IV SOLR: 40.0000 mg | Freq: Two times a day (BID) | INTRAVENOUS | Status: DC

## 2017-11-11 MED ORDER — MORPHINE SULFATE (PF) 4 MG/ML IV SOLN
4.0000 mg | Freq: Once | INTRAVENOUS | Status: AC
  Administered 2017-11-11: 4 mg via INTRAVENOUS
  Filled 2017-11-11: qty 1

## 2017-11-11 MED ORDER — INSULIN ASPART 100 UNIT/ML ~~LOC~~ SOLN
0.0000 [IU] | SUBCUTANEOUS | Status: DC
  Administered 2017-11-11 (×2): 5 [IU] via SUBCUTANEOUS
  Administered 2017-11-12: 3 [IU] via SUBCUTANEOUS
  Administered 2017-11-12: 2 [IU] via SUBCUTANEOUS
  Administered 2017-11-12: 5 [IU] via SUBCUTANEOUS
  Administered 2017-11-12: 3 [IU] via SUBCUTANEOUS
  Administered 2017-11-13: 2 [IU] via SUBCUTANEOUS
  Administered 2017-11-13: 5 [IU] via SUBCUTANEOUS
  Filled 2017-11-11 (×8): qty 1

## 2017-11-11 MED ORDER — VITAMIN B-1 100 MG PO TABS
100.0000 mg | ORAL_TABLET | Freq: Every day | ORAL | Status: DC
  Administered 2017-11-11 – 2017-11-13 (×3): 100 mg via ORAL
  Filled 2017-11-11 (×4): qty 1

## 2017-11-11 MED ORDER — AMLODIPINE BESYLATE 10 MG PO TABS
10.0000 mg | ORAL_TABLET | Freq: Every day | ORAL | Status: DC
  Administered 2017-11-11 – 2017-11-13 (×3): 10 mg via ORAL
  Filled 2017-11-11: qty 2
  Filled 2017-11-11 (×2): qty 1

## 2017-11-11 MED ORDER — SODIUM CHLORIDE 0.9% FLUSH
3.0000 mL | Freq: Two times a day (BID) | INTRAVENOUS | Status: DC
  Administered 2017-11-12: 3 mL via INTRAVENOUS

## 2017-11-11 MED ORDER — OXYCODONE HCL 5 MG PO TABS
5.0000 mg | ORAL_TABLET | ORAL | Status: DC | PRN
  Administered 2017-11-11 – 2017-11-12 (×2): 5 mg via ORAL
  Filled 2017-11-11 (×2): qty 1

## 2017-11-11 MED ORDER — OCTREOTIDE LOAD VIA INFUSION
50.0000 ug | Freq: Once | INTRAVENOUS | Status: AC
  Administered 2017-11-11: 50 ug via INTRAVENOUS
  Filled 2017-11-11: qty 25

## 2017-11-11 MED ORDER — PROMETHAZINE HCL 25 MG/ML IJ SOLN
25.0000 mg | Freq: Once | INTRAMUSCULAR | Status: AC
  Administered 2017-11-11: 25 mg via INTRAVENOUS

## 2017-11-11 MED ORDER — POTASSIUM CHLORIDE 10 MEQ/100ML IV SOLN
10.0000 meq | Freq: Once | INTRAVENOUS | Status: AC
  Administered 2017-11-11: 10 meq via INTRAVENOUS
  Filled 2017-11-11 (×2): qty 100

## 2017-11-11 MED ORDER — LORAZEPAM 1 MG PO TABS: 1.0000 mg | ORAL_TABLET | Freq: Four times a day (QID) | ORAL | Status: DC | PRN

## 2017-11-11 MED ORDER — ONDANSETRON HCL 4 MG PO TABS
4.0000 mg | ORAL_TABLET | Freq: Four times a day (QID) | ORAL | Status: DC | PRN
  Administered 2017-11-13: 4 mg via ORAL
  Filled 2017-11-11: qty 1

## 2017-11-11 MED ORDER — ACETAMINOPHEN 325 MG PO TABS
650.0000 mg | ORAL_TABLET | Freq: Four times a day (QID) | ORAL | Status: DC | PRN
  Administered 2017-11-11 – 2017-11-13 (×2): 650 mg via ORAL
  Filled 2017-11-11 (×2): qty 2

## 2017-11-11 MED ORDER — ONDANSETRON HCL 4 MG/2ML IJ SOLN
4.0000 mg | Freq: Four times a day (QID) | INTRAMUSCULAR | Status: DC | PRN
  Administered 2017-11-12 – 2017-11-13 (×3): 4 mg via INTRAVENOUS
  Filled 2017-11-11 (×3): qty 2

## 2017-11-11 NOTE — ED Triage Notes (Signed)
Pt. Arrived to the ED via [facility].  Patient was seen in the ED on 11/08/2017 c/o chest pain.  Patient was given prednisone and naproxen.  Patient states she has had GI upset and elevated blood sugars since starting the medication.  Patients CBG was 424 per [facility].  CBG was 305 when rechecked in the ED.

## 2017-11-11 NOTE — ED Provider Notes (Signed)
[facility] Emergency Department Provider Note   First MD Initiated Contact with Patient 11/11/17 0321     (approximate)  I have reviewed the triage vital signs and the nursing notes.   HISTORY  Chief Complaint Emesis    HPI Karen [name] is a 62 y.o. female with below list of chronic medical conditions presents to the emergency department with hyperglycemia and nausea since she began taking prednisone which was prescribed on 11/08/2017 secondary to cervical radiculopathy.  Patient's glucose 424 when checked by EMS.  She denies any abdominal discomfort.  Patient denies any fever.  Patient denies any diarrhea or constipation.   Past Medical History:  Diagnosis Date  . Alcoholic pancreatitis   . Asthma   . Depression   . Diabetes mellitus without complication ([place])   . DKA (diabetic ketoacidoses) ([place]) 02/17/2015  . Emphysematous cystitis   . Hypercholesteremia   . Hypertension   . Hypokalemia     Patient Active Problem List   Diagnosis Date Noted  . Pancreatitis, acute 01/03/2017  . ETOH abuse 01/03/2017  . UTI (urinary tract infection) 01/03/2017  . Nausea and vomiting   . Esophageal candidiasis ([place])   . Intractable vomiting with nausea   . Hypertensive urgency 04/21/2016  . Abdominal pain   . Acute renal failure ([place])   . Nausea vomiting and diarrhea   . Smoker   . Poorly controlled type 2 diabetes mellitus ([place])   . Acute renal insufficiency 10/29/2015  . Elevated troponin 10/29/2015  . Urinary tract infection without hematuria 10/29/2015  . Left flank pain 10/29/2015  . Malignant essential hypertension 10/29/2015  . DKA (diabetic ketoacidoses) ([place]) 02/17/2015  . Emphysematous cystitis     Past Surgical History:  Procedure Laterality Date  . ABDOMINAL HYSTERECTOMY  1996  . APPENDECTOMY  1997  . ESOPHAGOGASTRODUODENOSCOPY (EGD) WITH PROPOFOL N/A 04/24/2016   Procedure: ESOPHAGOGASTRODUODENOSCOPY (EGD) WITH PROPOFOL;  Surgeon: [name], MD;  Location: [facility] ENDOSCOPY;  Service: Endoscopy;  Laterality: N/A;  . HAND SURGERY  1988  . THYROID SURGERY  2013    Prior to Admission medications   Medication Sig Start Date End Date Taking? Authorizing Provider  amLODipine (NORVASC) 5 MG tablet Take 5 mg by mouth daily.    [provider]  atorvastatin (LIPITOR) 10 MG tablet Take 10 mg by mouth at bedtime.    [provider]  cyclobenzaprine (FLEXERIL) 5 MG tablet Take 1 tablet (5 mg total) by mouth 2 (two) times daily. Patient taking differently: Take 5 mg by mouth 2 (two) times daily as needed for muscle spasms.  01/07/17   Dustin Flock, MD  diphenhydrAMINE (BENADRYL) 25 MG tablet Take 25 mg by mouth every 6 (six) hours as needed for itching.     [provider]  flurazepam (DALMANE) 15 MG capsule Take 1 capsule (15 mg total) by mouth at bedtime as needed for sleep. 01/07/17   Dustin Flock, MD  HYDROcodone-acetaminophen (NORCO) 5-325 MG tablet Take 1 tablet by mouth every 6 (six) hours as needed for up to 15 doses for severe pain. 01/07/17   Dustin Flock, MD  insulin aspart protamine - aspart (NOVOLOG MIX 70/30 FLEXPEN) (70-30) 100 UNIT/ML FlexPen Inject 15 Units into the skin 2 (two) times daily.    [provider]  naproxen (NAPROSYN) 500 MG tablet Take 1 tablet (500 mg total) by mouth 2 (two) times daily with a meal. 11/08/17   Lavonia Drafts, MD  ondansetron (ZOFRAN) 4 MG tablet  Take 1 tablet (4 mg total) by mouth every 8 (eight) hours as needed for nausea or vomiting. 05/18/17   Nance Pear, MD  predniSONE (DELTASONE) 50 MG tablet Take 1 tablet (50 mg total) by mouth daily with breakfast. 11/08/17   Lavonia Drafts, MD  GLUCERNA [facility]) LIQD Take 237 mLs by mouth 3 (three) times daily between meals. 02/19/15 02/27/15  Bettey Costa, MD    Allergies Penicillins; Reglan [metoclopramide]; and Percocet [oxycodone-acetaminophen]  Family History  Family history unknown: Yes    Social  History Social History   Tobacco Use  . Smoking status: Former Research scientist (life sciences)  . Smokeless tobacco: Never Used  Substance Use Topics  . Alcohol use: Yes    Comment: 40oz/day  . Drug use: No    Review of Systems Constitutional: No fever/chills Eyes: No visual changes. ENT: No sore throat. Cardiovascular: Denies chest pain. Respiratory: Denies shortness of breath. Gastrointestinal: No abdominal pain.  Positive for nausea.  No diarrhea.  No constipation. Genitourinary: Negative for dysuria. Musculoskeletal: Negative for neck pain.  Negative for back pain. Integumentary: Negative for rash. Neurological: Negative for headaches, focal weakness or numbness.   ____________________________________________   PHYSICAL EXAM:  VITAL SIGNS: ED Triage Vitals  Enc Vitals Group     BP 11/11/17 0305 (!) 201/96     Pulse Rate 11/11/17 0305 98     Resp 11/11/17 0430 19     Temp 11/11/17 0305 98.6 F (37 C)     Temp Source 11/11/17 0305 Oral     SpO2 11/11/17 0305 100 %     Weight 11/11/17 0306 45.4 kg (100 lb)     Height 11/11/17 0306 1.549 m (5\' 1" )     Head Circumference --      Peak Flow --      Pain Score 11/11/17 0306 9     Pain Loc --      Pain Edu? --      Excl. in [place]? --     Constitutional: Alert and oriented. Well appearing and in no acute distress. Eyes: Conjunctivae are normal.  Head: Atraumatic. Mouth/Throat: Mucous membranes are moist. Oropharynx non-erythematous. Neck: No stridor.  Cardiovascular: Normal rate, regular rhythm. Good peripheral circulation. Grossly normal heart sounds. Respiratory: Normal respiratory effort.  No retractions. Lungs CTAB. Gastrointestinal: Soft and nontender. No distention.  Musculoskeletal: No lower extremity tenderness nor edema. No gross deformities of extremities. Neurologic:  Normal speech and language. No gross focal neurologic deficits are appreciated.  Skin:  Skin is warm, dry and intact. No rash noted. Psychiatric: Mood and affect  are normal. Speech and behavior are normal.  ____________________________________________   LABS (all labs ordered are listed, but only abnormal results are displayed)  Labs Reviewed  GLUCOSE, CAPILLARY - Abnormal; Notable for the following components:      Result Value   Glucose-Capillary 305 (*)    All other components within normal limits  COMPREHENSIVE METABOLIC PANEL - Abnormal; Notable for the following components:   Glucose, Bld 298 (*)    BUN 34 (*)    Creatinine, Ser 1.11 (*)    Calcium 8.6 (*)    Alkaline Phosphatase 249 (*)    Total Bilirubin 1.4 (*)    GFR calc non Af Amer 53 (*)    All other components within normal limits  GLUCOSE, CAPILLARY - Abnormal; Notable for the following components:   Glucose-Capillary 272 (*)    All other components within normal limits  CBC       Procedures  ____________________________________________   INITIAL IMPRESSION / ASSESSMENT AND PLAN / ED COURSE  As part of my medical decision making, I reviewed the following data within the electronic MEDICAL RECORD NUMBER  62 year old female presenting with above-stated history and physical exam of hyperglycemia and nausea after taking prednisone.  I suspect the patient's symptoms to be secondary to [place] side effects of prednisone.  Patient given 2 L IV normal saline the emergency department with improvement of glucose.  Patient also given Zofran.  Patient with no vomiting while in the emergency department.  Patient will be prescribed Zofran for home.  Patient advised to discontinue prednisone use  FINAL CLINICAL IMPRESSION(S) / ED DIAGNOSES  Final diagnoses:  Medication side effect, initial encounter  Hyperglycemia     MEDICATIONS GIVEN DURING THIS VISIT:  Medications  ondansetron (ZOFRAN) injection 4 mg (4 mg Intravenous Given 11/11/17 0339)  sodium chloride 0.9 % bolus 1,000 mL (0 mLs Intravenous Stopped 11/11/17 0530)  sodium chloride 0.9 % bolus 1,000 mL (0 mLs Intravenous  Stopped 11/11/17 0427)  ketorolac (TORADOL) 30 MG/ML injection 30 mg (30 mg Intravenous Given 11/11/17 0427)  ondansetron (ZOFRAN) injection 4 mg (4 mg Intravenous Given 11/11/17 1364)     ED Discharge Orders    None       Note:  This document was prepared using Dragon voice recognition software and may include unintentional dictation errors.    Gregor Hams, MD 11/11/17 [phone]

## 2017-11-11 NOTE — Progress Notes (Signed)
Advance care planning  Purpose of Encounter Upper GI bleed.  CODE STATUS discussion  Parties in Attendance Patient  Patients Decisional capacity Patient is alert and oriented.  Able to make medical decisions  Patient does not have a documented healthcare power of attorney.  She tells me she has 2 daughters one in [place] and one in [place]. She lives with a friend here in [place].  Wants a friend Jones,Gregory to make healthcare decisions if she is unable to at any point.  Encouraged her to complete documentation for healthcare power of attorney while she is in the hospital.  She is also not spoken to her friend regarding this.  Discussed regarding upper GI bleed.  All questions answered.  CODE STATUS discussed and patient wants to be full code.  Orders entered.  Time spent- 17 minutes

## 2017-11-11 NOTE — Progress Notes (Signed)
Patient with persistent vomiting unrelieved by zofran. MD orders for phenergan once. Also notified Md of elevated troponin and Md placed order to draw another troponin, patient on tele. No further interventions. Will continue to monitor

## 2017-11-11 NOTE — ED Provider Notes (Signed)
[facility] Emergency Department Provider Note ____________________________________________   I have reviewed the triage vital signs and the triage nursing note.  HISTORY  Chief Complaint Abdominal Pain and Hemoptysis   Historian Patient  HPI ARYKA COONRADT is a 62 y.o. female presents from home after she discharged from the emergency department earlier this morning with a history of hyperglycemia and dizziness and vomiting.  Patient states that when she got home she had persistent nausea vomiting and had a syncope.  No chest pain.  She had some dizziness prior to syncope.  No injury from passing out.  Reports epigastric pain similar to prior episodes of pancreatitis.  Of note she was recently on prednisone for what sounds like radicular pain, and this was recommended to be discontinued this morning secondary to side effect of hyperglycemia.  Patient states that at home she had numerous episodes of vomiting there was some small amount of red blood, but most of it was dark and coffee-ground.  Denies history of prior GI bleeding.  Denies black or bloody stools.  The abdomen is located in epigastrium and is moderate in intensity.  By chart review there is a history of alcoholic pancreatitis and alcohol abuse.  Also history of prior DKA.     Past Medical History:  Diagnosis Date  . Alcoholic pancreatitis   . Asthma   . Depression   . Diabetes mellitus without complication ([place])   . DKA (diabetic ketoacidoses) ([place]) 02/17/2015  . Emphysematous cystitis   . Hypercholesteremia   . Hypertension   . Hypokalemia     Patient Active Problem List   Diagnosis Date Noted  . Upper GI bleed 11/11/2017  . Pancreatitis, acute 01/03/2017  . ETOH abuse 01/03/2017  . UTI (urinary tract infection) 01/03/2017  . Nausea and vomiting   . Esophageal candidiasis ([place])   . Intractable vomiting with nausea   . Hypertensive urgency 04/21/2016  . Abdominal pain   . Acute  renal failure ([place])   . Nausea vomiting and diarrhea   . Smoker   . Poorly controlled type 2 diabetes mellitus ([place])   . Acute renal insufficiency 10/29/2015  . Elevated troponin 10/29/2015  . Urinary tract infection without hematuria 10/29/2015  . Left flank pain 10/29/2015  . Malignant essential hypertension 10/29/2015  . DKA (diabetic ketoacidoses) ([place]) 02/17/2015  . Emphysematous cystitis     Past Surgical History:  Procedure Laterality Date  . ABDOMINAL HYSTERECTOMY  1996  . APPENDECTOMY  1997  . ESOPHAGOGASTRODUODENOSCOPY (EGD) WITH PROPOFOL N/A 04/24/2016   Procedure: ESOPHAGOGASTRODUODENOSCOPY (EGD) WITH PROPOFOL;  Surgeon: [name], MD;  Location: [facility] ENDOSCOPY;  Service: Endoscopy;  Laterality: N/A;  . HAND SURGERY  1988  . THYROID SURGERY  2013    Prior to Admission medications   Medication Sig Start Date End Date Taking? Authorizing Provider  amLODipine (NORVASC) 5 MG tablet Take 5 mg by mouth daily.   Yes [provider]  atorvastatin (LIPITOR) 40 MG tablet Take 40 mg by mouth at bedtime.    Yes [provider]  cyclobenzaprine (FLEXERIL) 5 MG tablet Take 1 tablet (5 mg total) by mouth 2 (two) times daily. Patient taking differently: Take 5 mg by mouth 2 (two) times daily as needed for muscle spasms.  01/07/17  Yes Dustin Flock, MD  diphenhydrAMINE (BENADRYL) 25 MG tablet Take 25 mg by mouth every 6 (six) hours as needed for itching.    Yes [provider]  flurazepam (DALMANE) 15 MG capsule Take  1 capsule (15 mg total) by mouth at bedtime as needed for sleep. 01/07/17  Yes Dustin Flock, MD  HYDROcodone-acetaminophen (NORCO/VICODIN) 5-325 MG tablet Take 1 tablet by mouth 4 (four) times daily.   Yes [provider]  insulin aspart protamine - aspart (NOVOLOG MIX 70/30 FLEXPEN) (70-30) 100 UNIT/ML FlexPen Inject 15 Units into the skin 2 (two) times daily.   Yes [provider]  naproxen (NAPROSYN) 500 MG tablet Take 1  tablet (500 mg total) by mouth 2 (two) times daily with a meal. 11/08/17  Yes Lavonia Drafts, MD  predniSONE (DELTASONE) 50 MG tablet Take 1 tablet (50 mg total) by mouth daily with breakfast. 11/08/17  Yes Lavonia Drafts, MD  ondansetron (ZOFRAN ODT) 4 MG disintegrating tablet Take 1 tablet (4 mg total) by mouth every 8 (eight) hours as needed for nausea or vomiting. 11/11/17   Gregor Hams, MD  GLUCERNA [facility]) LIQD Take 237 mLs by mouth 3 (three) times daily between meals. 02/19/15 02/27/15  Bettey Costa, MD    Allergies  Allergen Reactions  . Penicillins Anaphylaxis    Has patient had a PCN reaction causing immediate rash, facial/tongue/throat swelling, SOB or lightheadedness with hypotension: Yes Has patient had a PCN reaction causing severe rash involving mucus membranes or skin necrosis: No Has patient had a PCN reaction that required hospitalization No Has patient had a PCN reaction occurring within the last 10 years: No If all of the above answers are "NO", then may proceed with Cephalosporin use.  . Reglan [Metoclopramide] Other (See Comments)    Hypotension, shortness of breath  . Percocet [Oxycodone-Acetaminophen] Rash    Family History  Family history unknown: Yes    Social History Social History   Tobacco Use  . Smoking status: Former Research scientist (life sciences)  . Smokeless tobacco: Never Used  Substance Use Topics  . Alcohol use: Yes    Comment: 40oz/day  . Drug use: No    Review of Systems  Constitutional: Negative for fever. Eyes: Negative for visual changes. ENT: Negative for sore throat. Cardiovascular: Negative for chest pain. Respiratory: Negative for shortness of breath. Gastrointestinal: Negative for diarrhea. Genitourinary: Negative for dysuria. Musculoskeletal: Negative for back pain. Skin: Negative for rash. Neurological: Negative for headache.  ____________________________________________   PHYSICAL EXAM:  VITAL SIGNS: ED Triage Vitals  Enc Vitals Group      BP 11/11/17 1326 (!) 210/95     Pulse Rate 11/11/17 1326 95     Resp 11/11/17 1326 20     Temp 11/11/17 1326 98.1 F (36.7 C)     Temp Source 11/11/17 1326 Oral     SpO2 11/11/17 1326 100 %     Weight 11/11/17 1328 99 lb 13.9 oz (45.3 kg)     Height 11/11/17 1328 5\' 1"  (1.549 m)     Head Circumference --      Peak Flow --      Pain Score 11/11/17 1327 9     Pain Loc --      Pain Edu? --      Excl. in [place]? --      Constitutional: Alert and oriented.  HEENT      Head: Normocephalic and atraumatic.      Eyes: Conjunctivae are normal. Pupils equal and round.       Ears:         Nose: No congestion/rhinnorhea.      Mouth/Throat: Mucous membranes are mildly dry.      Neck: No stridor. Cardiovascular/Chest: Normal  rate, regular rhythm.  No murmurs, rubs, or gallops. Respiratory: Normal respiratory effort without tachypnea nor retractions. Breath sounds are clear and equal bilaterally. No wheezes/rales/rhonchi. Gastrointestinal: Soft. No distention, no guarding, no rebound.  Moderate tenderness in epigastrium Genitourinary/rectal:Deferred Musculoskeletal: Nontender with normal range of motion in all extremities. No joint effusions.  No lower extremity tenderness.  No edema. Neurologic:  Normal speech and language. No gross or focal neurologic deficits are appreciated. Skin:  Skin is warm, dry and intact. No rash noted. Psychiatric: Mood and affect are normal. Speech and behavior are normal. Patient exhibits appropriate insight and judgment.   ____________________________________________  LABS (pertinent positives/negatives) I, Lisa Roca, MD the attending physician have reviewed the labs noted below.  Labs Reviewed  LIPASE, BLOOD - Abnormal; Notable for the following components:      Result Value   Lipase 64 (*)    All other components within normal limits  COMPREHENSIVE METABOLIC PANEL - Abnormal; Notable for the following components:   Potassium 3.3 (*)    Chloride 97  (*)    Glucose, Bld 330 (*)    BUN 29 (*)    Creatinine, Ser 1.03 (*)    Calcium 8.4 (*)    Alkaline Phosphatase 226 (*)    Total Bilirubin 1.5 (*)    GFR calc non Af Amer 58 (*)    All other components within normal limits  CBC - Abnormal; Notable for the following components:   WBC 11.3 (*)    All other components within normal limits  URINALYSIS, COMPLETE (UACMP) WITH MICROSCOPIC - Abnormal; Notable for the following components:   Color, Urine YELLOW (*)    APPearance HAZY (*)    Glucose, UA >=500 (*)    Hgb urine dipstick SMALL (*)    Ketones, ur 20 (*)    Protein, ur 100 (*)    Leukocytes, UA TRACE (*)    Bacteria, UA RARE (*)    All other components within normal limits  TROPONIN I - Abnormal; Notable for the following components:   Troponin I 0.10 (*)    All other components within normal limits  ETHANOL  HEMOGLOBIN  HIV ANTIBODY (ROUTINE TESTING W REFLEX)  BASIC METABOLIC PANEL  CBC  TYPE AND SCREEN    ____________________________________________    EKG I, Lisa Roca, MD, the attending physician have personally viewed and interpreted all ECGs.  97 bpm.  Normal sinus rhythm.  Narrow QS per normal axis.  Nonspecific ST ____________________________________________  RADIOLOGY   None __________________________________________  PROCEDURES  Procedure(s) performed: None  Procedures  Critical Care performed: None   ____________________________________________  ED COURSE / ASSESSMENT AND PLAN  Pertinent labs & imaging results that were available during my care of the patient were reviewed by me and considered in my medical decision making (see chart for details).    I reviewed the note from earlier this morning where patient had nausea and vomiting secondary to presumed hyperglycemia from being on prednisone.  Patient states she is continued to have nausea and vomiting as well as epigastric pain as well as now some coffee-ground emesis.  Under chart  review it appears that she has a history of alcohol use abuse and alcoholic pancreatitis.  Sound like she is probably having a flare of the pancreatitis and is not tolerating outpatient management.  We will go ahead and treat symptomatically with IV fluids as well as pain and nausea control at this point time as well as potassium for hypokalemia.  No additional hematemesis  here.  We will discuss with hospitalist regarding any additional possible treatment for possible hematemesis in terms of Protonix and Protonix drip and/or octreotide given history of alcohol abuse.  BUN and creatinine are slightly increased indicating a level of dehydration.  Hemoglobin dropped one-point, unclear if this is clinically significant, but will place type and screen.    CONSULTATIONS: Hospitalist, Dr. Darvin Neighbours for admission.   Patient / Family / Caregiver informed of clinical course, medical decision-making process, and agree with plan.   ___________________________________________   FINAL CLINICAL IMPRESSION(S) / ED DIAGNOSES   Final diagnoses:  Idiopathic acute pancreatitis, unspecified complication status  Hematemesis with nausea  Syncope, unspecified syncope type  Hypokalemia      ___________________________________________         Note: This dictation was prepared with Dragon dictation. Any transcriptional errors that result from this process are unintentional    Lisa Roca, MD 11/11/17 [phone]

## 2017-11-11 NOTE — H&P (Signed)
[facility] [place] NAME: Karen [name]    MR#:  [id number]  DATE OF BIRTH:  [birth year]  DATE OF ADMISSION:  11/11/2017  PRIMARY CARE PHYSICIAN: Novella Rob, FNP   REQUESTING/REFERRING PHYSICIAN: Dr. Reita Cliche  CHIEF COMPLAINT:   Chief Complaint  Patient presents with  . Abdominal Pain  . Hemoptysis    HISTORY OF PRESENT ILLNESS:  Karen [name]  is a 62 y.o. female with a known history of alcohol abuse, hypertension, diabetes mellitus presents to the emergency room due to vomiting and hematemesis.  Patient was seen earlier in the day mild abdominal pain and discharged home.  She was recently started on naproxen for pain.  Patient does have chronic pain syndrome.  Her hemoglobin has trended down from 13 point 3 in the morning to 12.3.  No vomiting here in the emergency room.  No melena or hematochezia.  Lipase is mildly elevated.  Has history of alcoholic pancreatitis in the past.  Patient mentions that she quit alcohol but follows this up saying that we would find alcohol in her blood since she had NyQuil cough medication.  PAST MEDICAL HISTORY:   Past Medical History:  Diagnosis Date  . Alcoholic pancreatitis   . Asthma   . Depression   . Diabetes mellitus without complication ([place])   . DKA (diabetic ketoacidoses) ([place]) 02/17/2015  . Emphysematous cystitis   . Hypercholesteremia   . Hypertension   . Hypokalemia     PAST SURGICAL HISTORY:   Past Surgical History:  Procedure Laterality Date  . ABDOMINAL HYSTERECTOMY  1996  . APPENDECTOMY  1997  . ESOPHAGOGASTRODUODENOSCOPY (EGD) WITH PROPOFOL N/A 04/24/2016   Procedure: ESOPHAGOGASTRODUODENOSCOPY (EGD) WITH PROPOFOL;  Surgeon: [name], MD;  Location: [facility] ENDOSCOPY;  Service: Endoscopy;  Laterality: N/A;  . HAND SURGERY  1988  . THYROID SURGERY  2013    SOCIAL HISTORY:   Social History   Tobacco Use  . Smoking status: Former Research scientist (life sciences)  . Smokeless tobacco: Never Used   Substance Use Topics  . Alcohol use: Yes    Comment: 40oz/day    FAMILY HISTORY:   Family History  Family history unknown: Yes    DRUG ALLERGIES:   Allergies  Allergen Reactions  . Penicillins Anaphylaxis    Has patient had a PCN reaction causing immediate rash, facial/tongue/throat swelling, SOB or lightheadedness with hypotension: Yes Has patient had a PCN reaction causing severe rash involving mucus membranes or skin necrosis: No Has patient had a PCN reaction that required hospitalization No Has patient had a PCN reaction occurring within the last 10 years: No If all of the above answers are "NO", then may proceed with Cephalosporin use.  . Reglan [Metoclopramide] Other (See Comments)    Hypotension, shortness of breath  . Percocet [Oxycodone-Acetaminophen] Rash    REVIEW OF SYSTEMS:   Review of Systems  Constitutional: Positive for malaise/fatigue. Negative for chills and fever.  HENT: Negative for sore throat.   Eyes: Negative for blurred vision, double vision and pain.  Respiratory: Negative for cough, hemoptysis, shortness of breath and wheezing.   Cardiovascular: Negative for chest pain, palpitations, orthopnea and leg swelling.  Gastrointestinal: Positive for abdominal pain, nausea and vomiting. Negative for constipation, diarrhea and heartburn.  Genitourinary: Negative for dysuria and hematuria.  Musculoskeletal: Positive for myalgias. Negative for back pain and joint pain.  Skin: Negative for rash.  Neurological: Negative for sensory change, speech change, focal weakness and headaches.  Endo/Heme/Allergies: Does not  bruise/bleed easily.  Psychiatric/Behavioral: Negative for depression. The patient is nervous/anxious.     MEDICATIONS AT HOME:   Prior to Admission medications   Medication Sig Start Date End Date Taking? Authorizing Provider  amLODipine (NORVASC) 5 MG tablet Take 5 mg by mouth daily.    [provider]  atorvastatin (LIPITOR) 10 MG  tablet Take 10 mg by mouth at bedtime.    [provider]  cyclobenzaprine (FLEXERIL) 5 MG tablet Take 1 tablet (5 mg total) by mouth 2 (two) times daily. Patient taking differently: Take 5 mg by mouth 2 (two) times daily as needed for muscle spasms.  01/07/17   Dustin Flock, MD  diphenhydrAMINE (BENADRYL) 25 MG tablet Take 25 mg by mouth every 6 (six) hours as needed for itching.     [provider]  flurazepam (DALMANE) 15 MG capsule Take 1 capsule (15 mg total) by mouth at bedtime as needed for sleep. 01/07/17   Dustin Flock, MD  HYDROcodone-acetaminophen (NORCO) 5-325 MG tablet Take 1 tablet by mouth every 6 (six) hours as needed for up to 15 doses for severe pain. 01/07/17   Dustin Flock, MD  insulin aspart protamine - aspart (NOVOLOG MIX 70/30 FLEXPEN) (70-30) 100 UNIT/ML FlexPen Inject 15 Units into the skin 2 (two) times daily.    [provider]  naproxen (NAPROSYN) 500 MG tablet Take 1 tablet (500 mg total) by mouth 2 (two) times daily with a meal. 11/08/17   Lavonia Drafts, MD  ondansetron (ZOFRAN ODT) 4 MG disintegrating tablet Take 1 tablet (4 mg total) by mouth every 8 (eight) hours as needed for nausea or vomiting. 11/11/17   Gregor Hams, MD  ondansetron (ZOFRAN) 4 MG tablet Take 1 tablet (4 mg total) by mouth every 8 (eight) hours as needed for nausea or vomiting. 05/18/17   Nance Pear, MD  predniSONE (DELTASONE) 50 MG tablet Take 1 tablet (50 mg total) by mouth daily with breakfast. 11/08/17   Lavonia Drafts, MD  GLUCERNA (GLUCERNA) LIQD Take 237 mLs by mouth 3 (three) times daily between meals. 02/19/15 02/27/15  Bettey Costa, MD     VITAL SIGNS:  Blood pressure (!) 210/95, pulse 95, temperature 98.1 F (36.7 C), temperature source Oral, resp. rate 20, height 5\' 1"  (1.549 m), weight 45.3 kg, SpO2 100 %.  PHYSICAL EXAMINATION:  Physical Exam  GENERAL:  62 y.o.-year-old patient lying in the bed with no acute distress.  EYES: Pupils equal,  round, reactive to light and accommodation. No scleral icterus. Extraocular muscles intact.  HEENT: Head atraumatic, normocephalic. Oropharynx and nasopharynx clear. No oropharyngeal erythema, dry oral mucosa  NECK:  Supple, no jugular venous distention. No thyroid enlargement, no tenderness.  LUNGS: Normal breath sounds bilaterally, no wheezing, rales, rhonchi. No use of accessory muscles of respiration.  CARDIOVASCULAR: S1, S2 normal. No murmurs, rubs, or gallops.  ABDOMEN: Soft, epigastric tenderness, nondistended. Bowel sounds present. No organomegaly or mass.  EXTREMITIES: No pedal edema, cyanosis, or clubbing. + 2 pedal & radial pulses b/l.   NEUROLOGIC: Cranial nerves II through XII are intact. No focal Motor or sensory deficits appreciated b/l PSYCHIATRIC: The patient is alert and oriented x 3.  Anxious SKIN: No obvious rash, lesion, or ulcer.   LABORATORY PANEL:   CBC Recent Labs  Lab 11/11/17 1332  WBC 11.3*  HGB 12.3  HCT 36.5  PLT 256   ------------------------------------------------------------------------------------------------------------------  Chemistries  Recent Labs  Lab 11/11/17 1332  NA 135  K 3.3*  CL 97*  CO2 23  GLUCOSE 330*  BUN 29*  CREATININE 1.03*  CALCIUM 8.4*  AST 15  ALT 25  ALKPHOS 226*  BILITOT 1.5*   ------------------------------------------------------------------------------------------------------------------  Cardiac Enzymes Recent Labs  Lab 11/08/17 1458  TROPONINI <0.03   ------------------------------------------------------------------------------------------------------------------  RADIOLOGY:  No results found.   IMPRESSION AND PLAN:   *Upper GI bleed likely secondary to gastritis from alcohol/naproxen.  Could be Mallory-Weiss tear. Counseled to quit alcohol. N.p.o.  Consult GI.  Protonix IV. No blood thinners.  Repeat hemoglobin in the evening and again in the morning.  *Uncontrolled hypertension.  Due to  noncompliance with medications..  Will give stat dose of Norvasc.  Hydralazine IV as needed  *Diabetes mellitus.  Takes 70/30 at home.  We will start her on 10 units Levemir here.  Sliding scale added.  *Alcohol abuse.  Add alcohol withdrawal protocol.  *DVT prophylaxis with SCDs  All the records are reviewed and case discussed with ED provider. Management plans discussed with the patient, family and they are in agreement.  CODE STATUS: Full code  TOTAL TIME TAKING CARE OF THIS PATIENT: 40 minutes.   Neita Carp M.D on 11/11/2017 at 2:18 PM  Between 7am to 6pm - Pager - [phone]  After 6pm go to [email or web address] - password [email or web address] [facility]  Office  [phone]  CC: Primary care physician; Novella Rob, FNP  Note: This dictation was prepared with Dragon dictation along with smaller phrase technology. Any transcriptional errors that result from this process are unintentional.

## 2017-11-11 NOTE — ED Triage Notes (Signed)
Pt to ED via EMS from home c/o abd pain and hemoptysis.  Per EMS pt seen in ED this morning for HTN and pancreatitis.  Pt states after getting home started vomiting large amounts of blood, dark, coffee ground like.  EMS vitals 384 CBG, 200/98 BP, and given 4mg  zofran en route.  Pt denies recent alcohol use.

## 2017-11-11 NOTE — ED Notes (Signed)
Lab called stating they need a new green top, last one hemolyzed.  New tube sent

## 2017-11-12 ENCOUNTER — Inpatient Hospital Stay: Payer: Medicaid Other | Admitting: Anesthesiology

## 2017-11-12 ENCOUNTER — Encounter: Payer: Self-pay | Admitting: Anesthesiology

## 2017-11-12 ENCOUNTER — Encounter
Admission: EM | Disposition: A | Discharge status: Home / Self Care | Payer: Self-pay | Source: Home / Self Care | Attending: Internal Medicine

## 2017-11-12 DIAGNOSIS — K922 Gastrointestinal hemorrhage, unspecified: Secondary | ICD-10-CM

## 2017-11-12 DIAGNOSIS — K920 Hematemesis: Secondary | ICD-10-CM

## 2017-11-12 DIAGNOSIS — R55 Syncope and collapse: Secondary | ICD-10-CM

## 2017-11-12 DIAGNOSIS — K8500 Idiopathic acute pancreatitis without necrosis or infection: Secondary | ICD-10-CM

## 2017-11-12 DIAGNOSIS — I248 Other forms of acute ischemic heart disease: Secondary | ICD-10-CM

## 2017-11-12 HISTORY — PX: ESOPHAGOGASTRODUODENOSCOPY: SHX5428

## 2017-11-12 LAB — BASIC METABOLIC PANEL
Anion gap: 9 (ref 5–15)
BUN: 28 mg/dL — ABNORMAL HIGH (ref 6–20)
CALCIUM: 7.9 mg/dL — AB (ref 8.9–10.3)
CO2: 24 mmol/L (ref 22–32)
CREATININE: 1.1 mg/dL — AB (ref 0.44–1.00)
Chloride: 108 mmol/L (ref 98–111)
GFR calc Af Amer: >60 mL/min (ref >60)
GFR, EST NON AFRICAN AMERICAN: 53 mL/min — AB (ref >60)
GLUCOSE: 157 mg/dL — AB (ref 70–99)
Potassium: 3.7 mmol/L (ref 3.5–5.1)
Sodium: 141 mmol/L (ref 135–145)

## 2017-11-12 LAB — CBC
HCT: 35.2 % — ABNORMAL LOW (ref 36.0–46.0)
Hemoglobin: 11.8 g/dL — ABNORMAL LOW (ref 12.0–15.0)
MCH: 29.3 pg (ref 26.0–34.0)
MCHC: 33.5 g/dL (ref 30.0–36.0)
MCV: 87.3 fL (ref 80.0–100.0)
Platelets: 244 10*3/uL (ref 150–400)
RBC: 4.03 MIL/uL (ref 3.87–5.11)
RDW: 12.3 % (ref 11.5–15.5)
WBC: 10.5 10*3/uL (ref 4.0–10.5)
nRBC: 0 % (ref 0.0–0.2)

## 2017-11-12 LAB — GLUCOSE, CAPILLARY
GLUCOSE-CAPILLARY: 128 mg/dL — AB (ref 70–99)
GLUCOSE-CAPILLARY: 159 mg/dL — AB (ref 70–99)
Glucose-Capillary: 115 mg/dL — ABNORMAL HIGH (ref 70–99)
Glucose-Capillary: 169 mg/dL — ABNORMAL HIGH (ref 70–99)
Glucose-Capillary: 226 mg/dL — ABNORMAL HIGH (ref 70–99)

## 2017-11-12 LAB — HIV ANTIBODY (ROUTINE TESTING W REFLEX): HIV SCREEN 4TH GENERATION: NONREACTIVE

## 2017-11-12 LAB — HEMOGLOBIN A1C
Hgb A1c MFr Bld: 10.7 % — ABNORMAL HIGH (ref 4.8–5.6)
MEAN PLASMA GLUCOSE: 260.39 mg/dL

## 2017-11-12 LAB — TROPONIN I: Troponin I: 0.07 ng/mL (ref <0.03)

## 2017-11-12 SURGERY — EGD (ESOPHAGOGASTRODUODENOSCOPY)
Anesthesia: General

## 2017-11-12 MED ORDER — LIDOCAINE HCL (CARDIAC) PF 100 MG/5ML IV SOSY
PREFILLED_SYRINGE | INTRAVENOUS | Status: DC | PRN
  Administered 2017-11-12: 40 mg via INTRAVENOUS

## 2017-11-12 MED ORDER — HYDROCODONE-ACETAMINOPHEN 5-325 MG PO TABS
1.0000 | ORAL_TABLET | Freq: Four times a day (QID) | ORAL | Status: DC
  Administered 2017-11-12 – 2017-11-13 (×4): 1 via ORAL
  Filled 2017-11-12 (×4): qty 1

## 2017-11-12 MED ORDER — PROPOFOL 10 MG/ML IV BOLUS
INTRAVENOUS | Status: DC | PRN
  Administered 2017-11-12: 50 mg via INTRAVENOUS

## 2017-11-12 MED ORDER — PROPOFOL 10 MG/ML IV BOLUS
INTRAVENOUS | Status: AC
  Filled 2017-11-12: qty 20

## 2017-11-12 MED ORDER — PROPOFOL 500 MG/50ML IV EMUL
INTRAVENOUS | Status: AC
  Filled 2017-11-12: qty 50

## 2017-11-12 MED ORDER — MIDAZOLAM HCL 2 MG/2ML IJ SOLN
INTRAMUSCULAR | Status: AC
  Filled 2017-11-12: qty 2

## 2017-11-12 MED ORDER — GLYCOPYRROLATE 0.2 MG/ML IJ SOLN
INTRAMUSCULAR | Status: AC
  Filled 2017-11-12: qty 1

## 2017-11-12 MED ORDER — MIDAZOLAM HCL 2 MG/2ML IJ SOLN
INTRAMUSCULAR | Status: DC | PRN
  Administered 2017-11-12: 2 mg via INTRAVENOUS

## 2017-11-12 MED ORDER — LIDOCAINE HCL (PF) 2 % IJ SOLN
INTRAMUSCULAR | Status: AC
  Filled 2017-11-12: qty 10

## 2017-11-12 MED ORDER — GLYCOPYRROLATE 0.2 MG/ML IJ SOLN
INTRAMUSCULAR | Status: DC | PRN
  Administered 2017-11-12: 0.2 mg via INTRAVENOUS

## 2017-11-12 MED ORDER — LABETALOL HCL 5 MG/ML IV SOLN
10.0000 mg | Freq: Once | INTRAVENOUS | Status: AC
  Administered 2017-11-12: 10 mg via INTRAVENOUS
  Filled 2017-11-12 (×2): qty 4

## 2017-11-12 MED ORDER — SODIUM CHLORIDE 0.9 % IV SOLN
INTRAVENOUS | Status: DC | PRN
  Administered 2017-11-12: 16:00:00 via INTRAVENOUS

## 2017-11-12 MED ORDER — INFLUENZA VAC SPLIT QUAD 0.5 ML IM SUSY
0.5000 mL | PREFILLED_SYRINGE | INTRAMUSCULAR | Status: AC
  Administered 2017-11-13: 0.5 mL via INTRAMUSCULAR
  Filled 2017-11-12: qty 0.5

## 2017-11-12 MED ORDER — TRAMADOL HCL 50 MG PO TABS: 50.0000 mg | ORAL_TABLET | Freq: Three times a day (TID) | ORAL | Status: DC | PRN

## 2017-11-12 MED ORDER — PROPOFOL 500 MG/50ML IV EMUL
INTRAVENOUS | Status: DC | PRN
  Administered 2017-11-12: 140 ug/kg/min via INTRAVENOUS

## 2017-11-12 NOTE — Consult Note (Signed)
Cardiology Consultation:   Patient ID: MIDGE MOMON MRN: [id number]; DOB: 05-21-1955  Admit date: 11/11/2017 Date of Consult: 11/12/2017  Primary Care Provider: Novella Rob, FNP Primary Cardiologist: new to [facility] Reason for consult: Chest pain elevated troponin Physician requesting consult Dr. Fritzi Mandes   Patient Profile:   Karen [name] is a 62 y.o. female with a hx of pancreatitis, alcohol abuse, atypical chest pain, cervical radiculopathy, presenting to the hospital with nausea vomiting, noted to have elevated troponins  History of Present Illness:   Recently discharged from the hospital, chronic nausea vomiting symptoms Several days ago in the hospital given prednisone for cervical radiculopathy She reports prednisone spiked her sugars, made her feel bad Radiculopathy improving still with some mild residual discomfort posterior shoulder on the left  At baseline is active, denies any significant chest pain or shortness of breath with exertion  She feels current symptoms of nausea vomiting abdominal discomfort are consistent with pancreatitis that she has appreciated in the past  CT scan abdomen reviewed from January 2019 showing mild aortic atherosclerosis, no distal coronary calcifications   Past Medical History:  Diagnosis Date  . Alcoholic pancreatitis   . Asthma   . Depression   . Diabetes mellitus without complication ([place])   . DKA (diabetic ketoacidoses) ([place]) 02/17/2015  . Emphysematous cystitis   . Hypercholesteremia   . Hypertension   . Hypokalemia     Past Surgical History:  Procedure Laterality Date  . ABDOMINAL HYSTERECTOMY  1996  . APPENDECTOMY  1997  . ESOPHAGOGASTRODUODENOSCOPY (EGD) WITH PROPOFOL N/A 04/24/2016   Procedure: ESOPHAGOGASTRODUODENOSCOPY (EGD) WITH PROPOFOL;  Surgeon: [name], MD;  Location: [facility] ENDOSCOPY;  Service: Endoscopy;  Laterality: N/A;  . HAND SURGERY  1988  . THYROID SURGERY  2013     Home Medications:  Prior to  Admission medications   Medication Sig Start Date End Date Taking? Authorizing Provider  amLODipine (NORVASC) 5 MG tablet Take 5 mg by mouth daily.   Yes [provider]  atorvastatin (LIPITOR) 40 MG tablet Take 40 mg by mouth at bedtime.    Yes [provider]  cyclobenzaprine (FLEXERIL) 5 MG tablet Take 1 tablet (5 mg total) by mouth 2 (two) times daily. Patient taking differently: Take 5 mg by mouth 2 (two) times daily as needed for muscle spasms.  01/07/17  Yes Dustin Flock, MD  diphenhydrAMINE (BENADRYL) 25 MG tablet Take 25 mg by mouth every 6 (six) hours as needed for itching.    Yes [provider]  flurazepam (DALMANE) 15 MG capsule Take 1 capsule (15 mg total) by mouth at bedtime as needed for sleep. 01/07/17  Yes Dustin Flock, MD  HYDROcodone-acetaminophen (NORCO/VICODIN) 5-325 MG tablet Take 1 tablet by mouth 4 (four) times daily.   Yes [provider]  insulin aspart protamine - aspart (NOVOLOG MIX 70/30 FLEXPEN) (70-30) 100 UNIT/ML FlexPen Inject 15 Units into the skin 2 (two) times daily.   Yes [provider]  naproxen (NAPROSYN) 500 MG tablet Take 1 tablet (500 mg total) by mouth 2 (two) times daily with a meal. 11/08/17  Yes Lavonia Drafts, MD  predniSONE (DELTASONE) 50 MG tablet Take 1 tablet (50 mg total) by mouth daily with breakfast. 11/08/17  Yes Lavonia Drafts, MD  ondansetron (ZOFRAN ODT) 4 MG disintegrating tablet Take 1 tablet (4 mg total) by mouth every 8 (eight) hours as needed for nausea or vomiting. 11/11/17   Gregor Hams, MD  GLUCERNA [facility]) LIQD Take 237 mLs  by mouth 3 (three) times daily between meals. 02/19/15 02/27/15  Bettey Costa, MD    Inpatient Medications: Scheduled Meds: . amLODipine  10 mg Oral Daily  . folic acid  1 mg Oral Daily  . HYDROcodone-acetaminophen  1 tablet Oral QID  . [START ON 11/13/2017] Influenza vac split quadrivalent PF  0.5 mL Intramuscular Tomorrow-1000  . insulin aspart  0-15 Units  Subcutaneous Q4H  . insulin detemir  10 Units Subcutaneous QHS  . multivitamin with minerals  1 tablet Oral Daily  . [START ON 11/15/2017] pantoprazole  40 mg Intravenous Q12H  . sodium chloride flush  3 mL Intravenous Q12H  . thiamine  100 mg Oral Daily   Or  . thiamine  100 mg Intravenous Daily   Continuous Infusions: . pantoprozole (PROTONIX) infusion 8 mg/hr (11/12/17 0752)   PRN Meds: acetaminophen **OR** acetaminophen, albuterol, hydrALAZINE, ondansetron **OR** ondansetron (ZOFRAN) IV, polyethylene glycol  Allergies:    Allergies  Allergen Reactions  . Penicillins Anaphylaxis    Has patient had a PCN reaction causing immediate rash, facial/tongue/throat swelling, SOB or lightheadedness with hypotension: Yes Has patient had a PCN reaction causing severe rash involving mucus membranes or skin necrosis: No Has patient had a PCN reaction that required hospitalization No Has patient had a PCN reaction occurring within the last 10 years: No If all of the above answers are "NO", then may proceed with Cephalosporin use.  . Reglan [Metoclopramide] Other (See Comments)    Hypotension, shortness of breath  . Percocet [Oxycodone-Acetaminophen] Rash    Social History:   Social History   Socioeconomic History  . Marital status: Single    Spouse name: Not on file  . Number of children: Not on file  . Years of education: Not on file  . Highest education level: Not on file  Occupational History  . Occupation: disabled  Social Needs  . Financial resource strain: Not on file  . Food insecurity:    Worry: Not on file    Inability: Not on file  . Transportation needs:    Medical: Not on file    Non-medical: Not on file  Tobacco Use  . Smoking status: Former Research scientist (life sciences)  . Smokeless tobacco: Never Used  Substance and Sexual Activity  . Alcohol use: Yes    Comment: 40oz/day  . Drug use: No  . Sexual activity: Not on file  Lifestyle  . Physical activity:    Days per week: Not on  file    Minutes per session: Not on file  . Stress: Not on file  Relationships  . Social connections:    Talks on phone: Not on file    Gets together: Not on file    Attends religious service: Not on file    Active member of club or organization: Not on file    Attends meetings of clubs or organizations: Not on file    Relationship status: Not on file  . Intimate partner violence:    Fear of current or ex partner: Not on file    Emotionally abused: Not on file    Physically abused: Not on file    Forced sexual activity: Not on file  Other Topics Concern  . Not on file  Social History Narrative  . Not on file    Family History:    Family History  Family history unknown: Yes     ROS:  Please see the history of present illness.  Review of Systems  Constitutional: Positive for malaise/fatigue.  Respiratory: Negative.   Cardiovascular: Negative.   Gastrointestinal: Positive for abdominal pain, nausea and vomiting.  Musculoskeletal: Negative.   Neurological: Negative.   Psychiatric/Behavioral: Negative.   All other systems reviewed and are negative.   All other ROS reviewed and negative.     Physical Exam/Data:   Vitals:   11/11/17 1959 11/12/17 0443 11/12/17 0700 11/12/17 1215  BP: (!) 151/75 (!) 169/80 (!) 166/77 (!) 141/82  Pulse: (!) 103 95 (!) 102 (!) 101  Resp: (!) 24 18  19   Temp: 99.1 F (37.3 C) 98.5 F (36.9 C) 98.5 F (36.9 C) 98.8 F (37.1 C)  TempSrc: Oral Oral Oral Oral  SpO2: 99% 97% 100% 100%  Weight:      Height:        Intake/Output Summary (Last 24 hours) at 11/12/2017 1221 Last data filed at 11/12/2017 3546 Gross per 24 hour  Intake 1866.42 ml  Output 900 ml  Net 966.42 ml   Filed Weights   11/11/17 1328  Weight: 45.3 kg   Body mass index is 18.87 kg/m.  General: Moderate distress , general malaise, nauseous, abdominal discomfort, otherwise thin, HEENT: normal Lymph: no adenopathy Neck: no JVD Endocrine:  No  thryomegaly Vascular: No carotid bruits; FA pulses 2+ bilaterally without bruits  Cardiac:  normal S1, S2; RRR; no murmur  Lungs:  clear to auscultation bilaterally, no wheezing, rhonchi or rales  Abd: soft, nontender, no hepatomegaly  Ext: no edema Musculoskeletal:  No deformities, BUE and BLE strength normal and equal Skin: warm and dry  Neuro:  CNs 2-12 intact, no focal abnormalities noted Psych:  Normal affect   EKG:  The EKG was personally reviewed and demonstrates: Normal sinus rhythm with no significant ST or T wave changes Telemetry:  Telemetry was personally reviewed and demonstrates: Normal sinus rhythm  Relevant CV Studies: Echocardiogram 2017 normal ejection fraction CT scan January 2019 mild aortic atherosclerosis, no distal coronary artery calcification  Laboratory Data:  Chemistry Recent Labs  Lab 11/11/17 0437 11/11/17 1332 11/12/17 0621  NA 139 135 141  K 4.0 3.3* 3.7  CL 99 97* 108  CO2 27 23 24   GLUCOSE 298* 330* 157*  BUN 34* 29* 28*  CREATININE 1.11* 1.03* 1.10*  CALCIUM 8.6* 8.4* 7.9*  GFRNONAA 53* 58* 53*  GFRAA >60 >60 >60  ANIONGAP 13 15 9     Recent Labs  Lab 11/11/17 0437 11/11/17 1332  PROT 8.1 7.4  ALBUMIN 4.1 3.9  AST 26 15  ALT 29 25  ALKPHOS 249* 226*  BILITOT 1.4* 1.5*   Hematology Recent Labs  Lab 11/11/17 0415 11/11/17 1332 11/11/17 1751 11/12/17 0621  WBC 9.5 11.3*  --  10.5  RBC 4.50 4.18  --  4.03  HGB 13.3 12.3 12.3 11.8*  HCT 38.3 36.5  --  35.2*  MCV 85.1 87.3  --  87.3  MCH 29.6 29.4  --  29.3  MCHC 34.7 33.7  --  33.5  RDW 12.4 12.4  --  12.3  PLT 267 256  --  244   Cardiac Enzymes Recent Labs  Lab 11/08/17 1458 11/11/17 1332 11/11/17 1751  TROPONINI <0.03 0.10* 0.11*   No results for input(s): TROPIPOC in the last 168 hours.  BNPNo results for input(s): BNP, PROBNP in the last 168 hours.  DDimer No results for input(s): DDIMER in the last 168 hours.  Radiology/Studies:  Dg Chest 2 View  Result  Date: 11/08/2017 CLINICAL DATA:  Chest pain EXAM: CHEST - 2 VIEW  COMPARISON:  04/25/2017 FINDINGS: The heart size and mediastinal contours are within normal limits. Both lungs are clear. The visualized skeletal structures are unremarkable. IMPRESSION: No active cardiopulmonary disease. Electronically Signed   By: Donavan Foil M.D.   On: 11/08/2017 15:28    Assessment and Plan:   1. Elevated troponin Likely demand ischemia in the setting of market hypertension on arrival systolic pressures of 867, also in the setting of nausea vomiting Non-trending, no further work-up needed at this time EKG is relatively normal  2.  Preop cardiovascular evaluation for endoscopy Acceptable risk, able to achieve 4 to 5 mets prior to recent abdominal discomfort No further testing needed As above elevated troponin from markedly elevated blood pressure on arrival Blood pressure since improved most recently 140/80 even with headache and nausea and abdominal discomfort  Alcohol abuse Cessation previously recommended, Did not discuss on today's visit  History of pancreatitis Patient concerned current symptoms are consistent with recurrence of her pancreatitis  Aortic atherosclerosis Mild noted on CT January 2019, extending into the common iliac vessels At a later date would consider risk factor modification, including examination of lipids, smoking    Total encounter time more than 110 minutes  Greater than 50% was spent in counseling and coordination of care with the patient   For questions or updates, please contact [place] Please consult [email or web address] for contact info under     Signed, Ida Rogue, MD  11/12/2017 12:21 PM

## 2017-11-12 NOTE — Op Note (Signed)
[facility] Gastroenterology Patient Name: Karen [name] Procedure Date: 11/12/2017 3:36 PM MRN: [id number] Account #: [id number] Date of Birth: May 21, 1955 Admit Type: Inpatient Age: 62 Room: [facility] ENDO ROOM 2 Gender: Female Note Status: Finalized Procedure:            Upper GI endoscopy Indications:          Coffee-ground emesis Providers:            Lin Landsman MD, MD Medicines:            Monitored Anesthesia Care Complications:        No immediate complications. Estimated blood loss: None. Procedure:            Pre-Anesthesia Assessment:                       - Prior to the procedure, a History and Physical was                        performed, and patient medications and allergies were                        reviewed. The patient is competent. The risks and                        benefits of the procedure and the sedation options and                        risks were discussed with the patient. All questions                        were answered and informed consent was obtained.                        Patient identification and proposed procedure were                        verified by the physician, the nurse, the                        anesthesiologist, the anesthetist and the technician in                        the pre-procedure area in the procedure room in the                        endoscopy suite. Mental Status Examination: alert and                        oriented. Airway Examination: normal oropharyngeal                        airway and neck mobility. Respiratory Examination:                        clear to auscultation. CV Examination: normal.                        Prophylactic Antibiotics: The patient does not require  prophylactic antibiotics. Prior Anticoagulants: The                        patient has taken no previous anticoagulant or                        antiplatelet agents. ASA Grade Assessment: III - A               patient with severe systemic disease. After reviewing                        the risks and benefits, the patient was deemed in                        satisfactory condition to undergo the procedure. The                        anesthesia plan was to use monitored anesthesia care                        (MAC). Immediately prior to administration of                        medications, the patient was re-assessed for adequacy                        to receive sedatives. The heart rate, respiratory rate,                        oxygen saturations, blood pressure, adequacy of                        pulmonary ventilation, and response to care were                        monitored throughout the procedure. The physical status                        of the patient was re-assessed after the procedure.                       After obtaining informed consent, the endoscope was                        passed under direct vision. Throughout the procedure,                        the patient's blood pressure, pulse, and oxygen                        saturations were monitored continuously. The Endoscope                        was introduced through the mouth, and advanced to the                        second part of duodenum. The upper GI endoscopy was                        accomplished without difficulty. The patient  tolerated                        the procedure well. Findings:      The duodenal bulb and second portion of the duodenum were normal.      Prominent ampulla, cannot r/o ampullary adenoma      The entire examined stomach was normal. Biopsies were taken with a cold       forceps for Helicobacter pylori testing.      The cardia and gastric fundus were normal on retroflexion.      The gastroesophageal junction and examined esophagus were normal. Impression:           - Normal duodenal bulb and second portion of the                        duodenum.                       - Prominent  ampulla, cannot r/o ampullary adenoma                       - Normal stomach. Biopsied.                       - Normal gastroesophageal junction and esophagus. Recommendation:       - Return patient to hospital ward for possible                        discharge same day.                       - Diabetic (ADA) diet today.                       - Continue present medications.                       - Await pathology results.                       - Return to GI clinic in 2 weeks.                       - Perform an upper endoscopic ultrasound (UEUS) to                        evaluate ampulla at appointment to be scheduled. Procedure Code(s):    --- Professional ---                       [id number], Esophagogastroduodenoscopy, flexible, transoral;                        with biopsy, single or multiple Diagnosis Code(s):    --- Professional ---                       K92.0, Hematemesis CPT copyright 2018 American Medical Association. All rights reserved. The codes documented in this report are preliminary and upon coder review may  be revised to meet current compliance requirements. Dr. Ulyess Mort Lin Landsman MD, MD 11/12/2017 4:25:21 PM This report has been signed electronically. Number of Addenda: 0 Note Initiated On: 11/12/2017 3:36 PM  [facility]

## 2017-11-12 NOTE — Anesthesia Postprocedure Evaluation (Signed)
Anesthesia Post Note  Patient: Karen [name]  Procedure(s) Performed: ESOPHAGOGASTRODUODENOSCOPY (EGD) (N/A )  Patient location during evaluation: PACU Anesthesia Type: General Level of consciousness: awake and alert Pain management: pain level controlled Vital Signs Assessment: post-procedure vital signs reviewed and stable Respiratory status: spontaneous breathing, nonlabored ventilation and respiratory function stable Cardiovascular status: stable Postop Assessment: no apparent nausea or vomiting Anesthetic complications: no   Pt given labetalol 10 mg IV for elevated BP in PACU   Last Vitals:  Vitals:   11/12/17 1701 11/12/17 1704  BP: (!) 175/75 (!) 181/72  Pulse:  80  Resp:  (!) 23  Temp:    SpO2:  97%    Last Pain:  Vitals:   11/12/17 1704  TempSrc:   PainSc: 0-No pain                 Durenda Hurt

## 2017-11-12 NOTE — Progress Notes (Signed)
Karen [name] at [place] NAME: Karen [name]    MR#:  [id number]  DATE OF BIRTH:  1955-09-13  SUBJECTIVE:  patient came to the emergency room with some abdominal pain chest pain she was prescribed prednisone and naproxen. She went home had coffee ground emesis came back admitted with upper G.I. bleed. No more hematemesis. Complains of headache this morning. She is NPO. She is requesting her Percocet.  REVIEW OF SYSTEMS:   Review of Systems  Constitutional: Negative for chills, fever and weight loss.  HENT: Negative for ear discharge, ear pain and nosebleeds.   Eyes: Negative for blurred vision, pain and discharge.  Respiratory: Negative for sputum production, shortness of breath, wheezing and stridor.   Cardiovascular: Negative for chest pain, palpitations, orthopnea and PND.  Gastrointestinal: Positive for nausea. Negative for abdominal pain, diarrhea and vomiting.  Genitourinary: Negative for frequency and urgency.  Musculoskeletal: Negative for back pain and joint pain.  Neurological: Positive for weakness and headaches. Negative for sensory change, speech change and focal weakness.  Psychiatric/Behavioral: Negative for depression and hallucinations. The patient is not nervous/anxious.    Tolerating Diet:npo Tolerating PT: ambulatory at home  DRUG ALLERGIES:   Allergies  Allergen Reactions  . Penicillins Anaphylaxis    Has patient had a PCN reaction causing immediate rash, facial/tongue/throat swelling, SOB or lightheadedness with hypotension: Yes Has patient had a PCN reaction causing severe rash involving mucus membranes or skin necrosis: No Has patient had a PCN reaction that required hospitalization No Has patient had a PCN reaction occurring within the last 10 years: No If all of the above answers are "NO", then may proceed with Cephalosporin use.  . Reglan [Metoclopramide] Other (See Comments)    Hypotension, shortness of breath   . Percocet [Oxycodone-Acetaminophen] Rash    VITALS:  Blood pressure (!) 166/77, pulse (!) 102, temperature 98.5 F (36.9 C), temperature source Oral, resp. rate 18, height 5\' 1"  (1.549 m), weight 45.3 kg, SpO2 100 %.  PHYSICAL EXAMINATION:   Physical Exam  GENERAL:  62 y.o.-year-old patient lying in the bed with no acute distress.  EYES: Pupils equal, round, reactive to light and accommodation. No scleral icterus. Extraocular muscles intact.  HEENT: Head atraumatic, normocephalic. Oropharynx and nasopharynx clear.  NECK:  Supple, no jugular venous distention. No thyroid enlargement, no tenderness.  LUNGS: Normal breath sounds bilaterally, no wheezing, rales, rhonchi. No use of accessory muscles of respiration.  CARDIOVASCULAR: S1, S2 normal. No murmurs, rubs, or gallops.  ABDOMEN: Soft, nontender, nondistended. Bowel sounds present. No organomegaly or mass.  EXTREMITIES: No cyanosis, clubbing or edema b/l.    NEUROLOGIC: Cranial nerves II through XII are intact. No focal Motor or sensory deficits b/l.   PSYCHIATRIC:  patient is alert and oriented x 3.  SKIN: No obvious rash, lesion, or ulcer.   LABORATORY PANEL:  CBC Recent Labs  Lab 11/12/17 0621  WBC 10.5  HGB 11.8*  HCT 35.2*  PLT 244    Chemistries  Recent Labs  Lab 11/11/17 1332 11/12/17 0621  NA 135 141  K 3.3* 3.7  CL 97* 108  CO2 23 24  GLUCOSE 330* 157*  BUN 29* 28*  CREATININE 1.03* 1.10*  CALCIUM 8.4* 7.9*  AST 15  --   ALT 25  --   ALKPHOS 226*  --   BILITOT 1.5*  --    Cardiac Enzymes Recent Labs  Lab 11/11/17 1751  TROPONINI 0.11*   RADIOLOGY:  No results  found. ASSESSMENT AND PLAN:  Karen [name]  is a 62 y.o. female with a known history of alcohol abuse, hypertension, diabetes mellitus presents to the emergency room due to vomiting and hematemesis.  Patient was seen earlier in the day mild abdominal pain and discharged home.  She was recently started on naproxen for pain.  Patient does have  chronic pain syndrome.  Her hemoglobin has trended down from 13 point 3 in the morning to 12.3.  No vomiting here in the emergency room.  No melena or hematochezia.  Lipase is mildly elevated.    *Upper GI bleed likely secondary to gastritis from ?alcohol/naproxen.  Could be Mallory-Weiss tear. Counseled to quit alcohol-- patient states she has not drank alcohol in a while. She takes NyQuil for her cough which likely has some amount. -Her serum alcohol level is negative. N.p.o.  Consult GI-- with Dr. Marius [name] . Patient likely will get EGD today. - Protonix IV. No blood thinners.  -No major drop in hemoglobin.  *Uncontrolled hypertension.  Due to noncompliance with medications..  Will give stat dose of Norvasc.  Hydralazine IV as needed -resume home meds  *Diabetes mellitus.  Takes 70/30 at home.  We will start her on 10 units Levemir here.  Sliding scale added.  *Patient has history of alcohol abuse. She reports not drinking alcohol lately. Her LFTs are normal. Serum alcohol level times two negative. -Continue to monitor for symptoms of withdrawal.  *DVT prophylaxis with SCDs  Case discussed with Care Management/Social Worker. Management plans discussed with the patient, family and they are in agreement.  CODE STATUS: full  DVT Prophylaxis: SCD  TOTAL TIME TAKING CARE OF THIS PATIENT: *30* minutes.  >50% time spent on counselling and coordination of care  POSSIBLE D/C IN *1-2* DAYS, DEPENDING ON CLINICAL CONDITION.  Note: This dictation was prepared with Dragon dictation along with smaller phrase technology. Any transcriptional errors that result from this process are unintentional.  Karen [name] M.D on 11/12/2017 at 11:53 AM  Between 7am to 6pm - Pager - [phone]  After 6pm go to [email or web address] - password [email or web address] [facility]  Office  [phone]  CC: Primary care physician; Karen [name], FNPPatient ID: Karen [name], female   DOB: [birth year], 62  y.o.   MRN: [id number]

## 2017-11-12 NOTE — Progress Notes (Signed)
BP 175/75 Dr. Ola Spurr aware and ok for pt. To go back to room 207

## 2017-11-12 NOTE — Anesthesia Procedure Notes (Signed)
Date/Time: 11/12/2017 4:12 PM Performed by: Doreen Salvage, CRNA Pre-anesthesia Checklist: Patient identified, Emergency Drugs available, Suction available and Patient being monitored Patient Re-evaluated:Patient Re-evaluated prior to induction Oxygen Delivery Method: Nasal cannula Induction Type: IV induction Dental Injury: Teeth and Oropharynx as per pre-operative assessment  Comments: Nasal cannula with etCO2 monitoring

## 2017-11-12 NOTE — Progress Notes (Signed)
191 /118 post endo. Dr. Ola Spurr notified. Labetolol adm IV

## 2017-11-12 NOTE — Transfer of Care (Signed)
Immediate Anesthesia Transfer of Care Note  Patient: Karen [name]  Procedure(s) Performed: Procedure(s): ESOPHAGOGASTRODUODENOSCOPY (EGD) (N/A)  Patient Location: PACU and Endoscopy Unit  Anesthesia Type:General  Level of Consciousness: sedated  Airway & Oxygen Therapy: Patient Spontanous Breathing and Patient connected to nasal cannula oxygen  Post-op Assessment: Report given to RN and Post -op Vital signs reviewed and stable  Post vital signs: Reviewed and stable  Last Vitals:  Vitals:   11/12/17 1552 11/12/17 1624  BP: (!) 156/82 (!) 160/80  Pulse: 87 90  Resp: 20 (!) 22  Temp: 37 C (!) 36.1 C  SpO2: 664% 40%    Complications: No apparent anesthesia complications

## 2017-11-12 NOTE — Anesthesia Preprocedure Evaluation (Addendum)
Anesthesia Evaluation  Patient identified by MRN, date of birth, ID band Patient awake    Reviewed: Allergy & Precautions, H&P , NPO status , Patient's Chart, lab work & pertinent test results  Airway Mallampati: III  TM Distance: >3 FB Neck ROM: full    Dental  (+) Edentulous Upper, Missing   Pulmonary asthma , former smoker,    breath sounds clear to auscultation       Cardiovascular hypertension,  Rhythm:regular Rate:Normal  Echo 10/30/15: - Left ventricle: The cavity size was normal. Systolic function was   normal. The estimated ejection fraction was in the range of 60%   to 65%. Wall motion was normal; there were no regional wall   motion abnormalities. Doppler parameters are consistent with   abnormal left ventricular relaxation (grade 1 diastolic   dysfunction). - Mitral valve: There was mild regurgitation. - Left atrium: The atrium was normal in size. - Right ventricle: Systolic function was normal. - Pulmonary arteries: Systolic pressure was within the normal   range.   Neuro/Psych PSYCHIATRIC DISORDERS Depression negative neurological ROS  negative psych ROS   GI/Hepatic negative GI ROS, (+)     substance abuse  alcohol use,   Endo/Other  diabetes  Renal/GU negative Renal ROS  negative genitourinary   Musculoskeletal   Abdominal   Peds  Hematology negative hematology ROS (+)   Anesthesia Other Findings 62 yo with hematemesis yesterday, none today, presenting for EGD.  Reproductive/Obstetrics negative OB ROS                           Anesthesia Physical Anesthesia Plan  ASA: III  Anesthesia Plan: General   Post-op Pain Management:    Induction:   PONV Risk Score and Plan: Propofol infusion and TIVA  Airway Management Planned:   Additional Equipment:   Intra-op Plan:   Post-operative Plan:   Informed Consent: I have reviewed the patients History and Physical,  chart, labs and discussed the procedure including the risks, benefits and alternatives for the proposed anesthesia with the patient or authorized representative who has indicated his/her understanding and acceptance.   Dental Advisory Given  Plan Discussed with: Anesthesiologist, CRNA and Surgeon  Anesthesia Plan Comments:         Anesthesia Quick Evaluation

## 2017-11-12 NOTE — Consult Note (Signed)
Karen Darby, MD [address]  [place]  [place], [place] [address]  Main: [phone]  Fax: [phone] Pager: [phone]   Consultation  Referring Provider:     No ref. provider found Primary Care Physician:  Novella Rob, FNP Primary Gastroenterologist:  Dr. Yuan Norris         Reason for Consultation:     Coffee-ground emesis  Date of Admission:  11/11/2017 Date of Consultation:  11/12/2017         HPI:   Karen [name] is a 62 y.o. female known history of alcohol abuse, diabetes, poorly controlled on insulin, hypertension who presented to ER yesterday with nausea, vomiting coffee-ground material associated with epigastric pain.  She was in the ER earlier yesterday morning and was discharged home.  Patient reports that she has been taking naproxen and prednisone for left arm pain/chest pain after which she started experiencing nausea and vomiting.  Patient's hemoglobin on admission was 13.3, dropped to 11.8 today.  Patient is started on pantoprazole and octreotide drips.  Patient does not have cirrhosis or chronic liver disease.  Kept n.p.o. and GI is consulted for further evaluation.  Patient did not have any further episodes of coffee-ground emesis since admission.  She had elevated blood sugars in 300s on admission.  Patient's diabetes has been poorly controlled, her hemoglobin A1c has always been above 10.  Patient denies hematemesis, melena or hematochezia  NSAIDs: Naproxen  Antiplts/Anticoagulants/Anti thrombotics: None  GI Procedures: EGD in 04/2016 - Monilial esophagitis. Cells for cytology obtained. - Normal stomach. - Normal examined duodenum. KOH prep was normal  Past Medical History:  Diagnosis Date  . Alcoholic pancreatitis   . Asthma   . Depression   . Diabetes mellitus without complication ([place])   . DKA (diabetic ketoacidoses) ([place]) 02/17/2015  . Emphysematous cystitis   . Hypercholesteremia   . Hypertension   . Hypokalemia     Past Surgical  History:  Procedure Laterality Date  . ABDOMINAL HYSTERECTOMY  1996  . APPENDECTOMY  1997  . ESOPHAGOGASTRODUODENOSCOPY (EGD) WITH PROPOFOL N/A 04/24/2016   Procedure: ESOPHAGOGASTRODUODENOSCOPY (EGD) WITH PROPOFOL;  Surgeon: [name], MD;  Location: [facility] ENDOSCOPY;  Service: Endoscopy;  Laterality: N/A;  . HAND SURGERY  1988  . THYROID SURGERY  2013    Prior to Admission medications   Medication Sig Start Date End Date Taking? Authorizing Provider  amLODipine (NORVASC) 5 MG tablet Take 5 mg by mouth daily.   Yes [provider]  atorvastatin (LIPITOR) 40 MG tablet Take 40 mg by mouth at bedtime.    Yes [provider]  cyclobenzaprine (FLEXERIL) 5 MG tablet Take 1 tablet (5 mg total) by mouth 2 (two) times daily. Patient taking differently: Take 5 mg by mouth 2 (two) times daily as needed for muscle spasms.  01/07/17  Yes Dustin Flock, MD  diphenhydrAMINE (BENADRYL) 25 MG tablet Take 25 mg by mouth every 6 (six) hours as needed for itching.    Yes [provider]  flurazepam (DALMANE) 15 MG capsule Take 1 capsule (15 mg total) by mouth at bedtime as needed for sleep. 01/07/17  Yes Dustin Flock, MD  HYDROcodone-acetaminophen (NORCO/VICODIN) 5-325 MG tablet Take 1 tablet by mouth 4 (four) times daily.   Yes [provider]  insulin aspart protamine - aspart (NOVOLOG MIX 70/30 FLEXPEN) (70-30) 100 UNIT/ML FlexPen Inject 15 Units into the skin 2 (two) times daily.   Yes [provider]  naproxen (NAPROSYN) 500 MG  tablet Take 1 tablet (500 mg total) by mouth 2 (two) times daily with a meal. 11/08/17  Yes Lavonia Drafts, MD  predniSONE (DELTASONE) 50 MG tablet Take 1 tablet (50 mg total) by mouth daily with breakfast. 11/08/17  Yes Lavonia Drafts, MD  ondansetron (ZOFRAN ODT) 4 MG disintegrating tablet Take 1 tablet (4 mg total) by mouth every 8 (eight) hours as needed for nausea or vomiting. 11/11/17   Gregor Hams, MD  GLUCERNA [facility]) LIQD  Take 237 mLs by mouth 3 (three) times daily between meals. 02/19/15 02/27/15  Bettey Costa, MD    Current Facility-Administered Medications:  .  acetaminophen (TYLENOL) tablet 650 mg, 650 mg, Oral, Q6H PRN, 650 mg at 11/11/17 2243 **OR** acetaminophen (TYLENOL) suppository 650 mg, 650 mg, Rectal, Q6H PRN, Sudini, Srikar, MD .  albuterol (PROVENTIL) (2.5 MG/3ML) 0.083% nebulizer solution 2.5 mg, 2.5 mg, Nebulization, Q2H PRN, Sudini, Srikar, MD .  amLODipine (NORVASC) tablet 10 mg, 10 mg, Oral, Daily, Sudini, Srikar, MD, 10 mg at 11/12/17 7106 .  folic acid (FOLVITE) tablet 1 mg, 1 mg, Oral, Daily, Sudini, Srikar, MD, 1 mg at 11/12/17 2694 .  hydrALAZINE (APRESOLINE) injection 20 mg, 20 mg, Intravenous, Q4H PRN, Hillary Bow, MD, 20 mg at 11/11/17 1718 .  HYDROcodone-acetaminophen (NORCO/VICODIN) 5-325 MG per tablet 1 tablet, 1 tablet, Oral, QID, Fritzi Mandes, MD, 1 tablet at 11/12/17 1210 .  [START ON 11/13/2017] Influenza vac split quadrivalent PF (FLUARIX) injection 0.5 mL, 0.5 mL, Intramuscular, Tomorrow-1000, Patel, Sona, MD .  insulin aspart (novoLOG) injection 0-15 Units, 0-15 Units, Subcutaneous, Q4H, Sudini, Srikar, MD, 2 Units at 11/12/17 1140 .  insulin detemir (LEVEMIR) injection 10 Units, 10 Units, Subcutaneous, QHS, Sudini, Srikar, MD, 10 Units at 11/11/17 1715 .  multivitamin with minerals tablet 1 tablet, 1 tablet, Oral, Daily, Hillary Bow, MD, 1 tablet at 11/12/17 0823 .  ondansetron (ZOFRAN) tablet 4 mg, 4 mg, Oral, Q6H PRN **OR** ondansetron (ZOFRAN) injection 4 mg, 4 mg, Intravenous, Q6H PRN, Hillary Bow, MD, 4 mg at 11/12/17 1221 .  pantoprazole (PROTONIX) 80 mg in sodium chloride 0.9 % 250 mL (0.32 mg/mL) infusion, 8 mg/hr, Intravenous, Continuous, Lisa Roca, MD, Last Rate: 25 mL/hr at 11/12/17 1333, 8 mg/hr at 11/12/17 1333 .  [START ON 11/15/2017] pantoprazole (PROTONIX) injection 40 mg, 40 mg, Intravenous, Q12H, Lisa Roca, MD .  polyethylene glycol (MIRALAX /  GLYCOLAX) packet 17 g, 17 g, Oral, Daily PRN, Sudini, Srikar, MD .  sodium chloride flush (NS) 0.9 % injection 3 mL, 3 mL, Intravenous, Q12H, Sudini, Srikar, MD .  thiamine (VITAMIN B-1) tablet 100 mg, 100 mg, Oral, Daily, 100 mg at 11/12/17 0823 **OR** thiamine (B-1) injection 100 mg, 100 mg, Intravenous, Daily, Sudini, Alveta Heimlich, MD  Family History  Family history unknown: Yes     Social History   Tobacco Use  . Smoking status: Former Research scientist (life sciences)  . Smokeless tobacco: Never Used  Substance Use Topics  . Alcohol use: Yes    Comment: 40oz/day  . Drug use: No    Allergies as of 11/11/2017 - Review Complete 11/11/2017  Allergen Reaction Noted  . Penicillins Anaphylaxis 05/16/2014  . Reglan [metoclopramide] Other (See Comments) 12/10/2015  . Percocet [oxycodone-acetaminophen] Rash 05/16/2014    Review of Systems:    All systems reviewed and negative except where noted in HPI.   Physical Exam:  Vital signs in last 24 hours: Temp:  [98.5 F (36.9 C)-99.1 F (37.3 C)] 98.8 F (37.1 C) (11/08 1215) Pulse Rate:  [  93-103] 101 (11/08 1215) Resp:  [18-24] 19 (11/08 1215) BP: (141-193)/(75-93) 141/82 (11/08 1215) SpO2:  [97 %-100 %] 100 % (11/08 1215)   General:   Pleasant, thin build, cooperative in NAD Head:  Normocephalic and atraumatic. Eyes:   No icterus.   Conjunctiva pink. PERRLA. Ears:  Normal auditory acuity. Neck:  Supple; no masses or thyroidomegaly Lungs: Respirations even and unlabored. Lungs clear to auscultation bilaterally.   No wheezes, crackles, or rhonchi.  Heart:  Regular rate and rhythm;  Without murmur, clicks, rubs or gallops Abdomen:  Soft, nondistended, nontender. Normal bowel sounds. No appreciable masses or hepatomegaly.  No rebound or guarding.  Rectal:  Not performed. Msk:  Symmetrical without gross deformities.  Strength generalized weakness Extremities:  Without edema, cyanosis or clubbing. Neurologic:  Alert and oriented x3;  grossly normal  neurologically. Skin:  Intact without significant lesions or rashes. Cervical Nodes:  No significant cervical adenopathy. Psych:  Alert and cooperative. Normal affect.  LAB RESULTS: CBC Latest Ref Rng & Units 11/12/2017 11/11/2017 11/11/2017  WBC 4.0 - 10.5 K/uL 10.5 - 11.3(H)  Hemoglobin 12.0 - 15.0 g/dL 11.8(L) 12.3 12.3  Hematocrit 36.0 - 46.0 % 35.2(L) - 36.5  Platelets 150 - 400 K/uL 244 - 256    BMET BMP Latest Ref Rng & Units 11/12/2017 11/11/2017 11/11/2017  Glucose 70 - 99 mg/dL 157(H) 330(H) 298(H)  BUN 6 - 20 mg/dL 28(H) 29(H) 34(H)  Creatinine 0.44 - 1.00 mg/dL 1.10(H) 1.03(H) 1.11(H)  Sodium 135 - 145 mmol/L 141 135 139  Potassium 3.5 - 5.1 mmol/L 3.7 3.3(L) 4.0  Chloride 98 - 111 mmol/L 108 97(L) 99  CO2 22 - 32 mmol/L 24 23 27   Calcium 8.9 - 10.3 mg/dL 7.9(L) 8.4(L) 8.6(L)    LFT Hepatic Function Latest Ref Rng & Units 11/11/2017 11/11/2017 11/01/2017  Total Protein 6.5 - 8.1 g/dL 7.4 8.1 8.1  Albumin 3.5 - 5.0 g/dL 3.9 4.1 4.1  AST 15 - 41 U/L 15 26 48(H)  ALT 0 - 44 U/L 25 29 125(H)  Alk Phosphatase 38 - 126 U/L 226(H) 249(H) 460(H)  Total Bilirubin 0.3 - 1.2 mg/dL 1.5(H) 1.4(H) 0.5  Bilirubin, Direct 0.1 - 0.5 mg/dL - - -     STUDIES: No results found.    Impression / Plan:   Karen [name] is a 62 y.o. female with history of insulin-dependent diabetes mellitus, alcohol use admitted with hyperglycemia, nausea, vomiting and coffee-ground emesis  Coffee-ground emesis Differentials include erosive esophagitis or gastritis or peptic ulcer disease or gastroparesis in the setting of uncontrolled diabetes mellitus  Patient does not have evidence of cirrhosis, octreotide drip is discontinued Continue pantoprazole drip Monitor CBC N.p.o. EGD today Tight control of diabetes Avoid NSAIDs and abstinence from alcohol   Thank you for involving me in the care of this patient.      LOS: 1 day   Sherri Sear, MD  11/12/2017, 3:35 PM   Note: This dictation was  prepared with Dragon dictation along with smaller phrase technology. Any transcriptional errors that result from this process are unintentional.

## 2017-11-12 NOTE — Progress Notes (Signed)
Patient ID: Karen [name], female   DOB: 05/22/1955, 62 y.o.   MRN: [id number] patient's troponin is .1-- repeat is .11 anesthesia requesting cardiology clearance patient does not have any history of coronary artery disease according to the records. Last echo done in 2017 shows EF of 60 to 65%. No wall motion abnormality.  Discussed with Dr. Rockey Situ to see patient for cardiology clearance. Patient to get EGD today. Dr. Marius Ditch informed.

## 2017-11-12 NOTE — Anesthesia Post-op Follow-up Note (Signed)
Anesthesia QCDR form completed.        

## 2017-11-13 LAB — GLUCOSE, CAPILLARY
GLUCOSE-CAPILLARY: 142 mg/dL — AB (ref 70–99)
GLUCOSE-CAPILLARY: 56 mg/dL — AB (ref 70–99)
GLUCOSE-CAPILLARY: 216 mg/dL — AB (ref 70–99)
Glucose-Capillary: 139 mg/dL — ABNORMAL HIGH (ref 70–99)

## 2017-11-13 MED ORDER — LOPERAMIDE HCL 2 MG PO CAPS: 2.0000 mg | ORAL_CAPSULE | Freq: Four times a day (QID) | ORAL | 0 refills | Status: DC | PRN

## 2017-11-13 MED ORDER — LOPERAMIDE HCL 2 MG PO CAPS
2.0000 mg | ORAL_CAPSULE | Freq: Four times a day (QID) | ORAL | Status: DC | PRN
  Administered 2017-11-13: 2 mg via ORAL
  Filled 2017-11-13: qty 1

## 2017-11-13 NOTE — Progress Notes (Signed)
[facility] [place] NAME: Karen [name]    MR#:  [id number]  DATE OF BIRTH:  July 23, 1955  DATE OF ADMISSION:  11/11/2017 ADMITTING PHYSICIAN: Hillary Bow, MD  DATE OF DISCHARGE: 11/13/2017  PRIMARY CARE PHYSICIAN: Novella Rob, FNP    ADMISSION DIAGNOSIS:  Hypokalemia [E87.6] Hematemesis with nausea [K92.0] Syncope, unspecified syncope type [R55] Idiopathic acute pancreatitis, unspecified complication status [K44.01]  DISCHARGE DIAGNOSIS:  coffee ground emesis suspected due to gastritis acute from naproxen/prednisone  SECONDARY DIAGNOSIS:   Past Medical History:  Diagnosis Date  . Alcoholic pancreatitis   . Asthma   . Depression   . Diabetes mellitus without complication ([place])   . DKA (diabetic ketoacidoses) ([place]) 02/17/2015  . Emphysematous cystitis   . Hypercholesteremia   . Hypertension   . Hypokalemia     HOSPITAL COURSE:  Karen [name] a62 y.o.femalewith a known history of alcohol abuse,hypertension, diabetes mellitus presents to the emergency room due to vomiting and hematemesis. Patient was seen earlier in the day mild abdominal pain and discharged home. She was recently started on naproxen for pain. Patient does have chronic pain syndrome. Her hemoglobin has trended down from 13 point 3 in the morning to 12.3. No melena or hematochezia. Lipase is mildly elevated.   * Upper GI bleed likely secondary to gastritis from l/naproxen.  -Counseled to quit alcohol-- patient states she has not drank alcohol in a while. She takes NyQuil for her cough which likely has some amount. -Her serum alcohol level is negative. - Consult GI-- with Dr. Marius Ditch . EGD within normal limits. Biopsy results should be followed by G.I. as outpatient -IV Protonix. No blood thinners.  -No major drop in hemoglobin. -Soft diet. -Having Some diarrhea. Will give her Imodium.  *Uncontrolled hypertension. Due to noncompliance with  medications.-- Pressure much improved - Hydralazine IV as needed -resume home meds  *Diabetes mellitus. Takes 70/30 at home.  -Continue sliding scale insulin Lantus. Patient will resume her home does insulin. -Advised to keep sugar checks.  *Patient has history of alcohol abuse. She reports not drinking alcohol lately. Her LFTs are normal. Serum alcohol level times two negative. -Continue to monitor for symptoms of withdrawal.  *DVT prophylaxis with SCDs  If continues to show improvement discharged home later today CONSULTS OBTAINED:  Treatment Team:  Lin Landsman, MD  DRUG ALLERGIES:   Allergies  Allergen Reactions  . Penicillins Anaphylaxis    Has patient had a PCN reaction causing immediate rash, facial/tongue/throat swelling, SOB or lightheadedness with hypotension: Yes Has patient had a PCN reaction causing severe rash involving mucus membranes or skin necrosis: No Has patient had a PCN reaction that required hospitalization No Has patient had a PCN reaction occurring within the last 10 years: No If all of the above answers are "NO", then may proceed with Cephalosporin use.  . Reglan [Metoclopramide] Other (See Comments)    Hypotension, shortness of breath  . Percocet [Oxycodone-Acetaminophen] Rash    DISCHARGE MEDICATIONS:   Allergies as of 11/13/2017      Reactions   Penicillins Anaphylaxis   Has patient had a PCN reaction causing immediate rash, facial/tongue/throat swelling, SOB or lightheadedness with hypotension: Yes Has patient had a PCN reaction causing severe rash involving mucus membranes or skin necrosis: No Has patient had a PCN reaction that required hospitalization No Has patient had a PCN reaction occurring within the last 10 years: No If all of the above answers are "NO", then may proceed  with Cephalosporin use.   Reglan [metoclopramide] Other (See Comments)   Hypotension, shortness of breath   Percocet [oxycodone-acetaminophen] Rash       Medication List    STOP taking these medications   naproxen 500 MG tablet Commonly known as:  NAPROSYN   predniSONE 50 MG tablet Commonly known as:  DELTASONE     TAKE these medications   amLODipine 5 MG tablet Commonly known as:  NORVASC Take 5 mg by mouth daily.   atorvastatin 40 MG tablet Commonly known as:  LIPITOR Take 40 mg by mouth at bedtime.   cyclobenzaprine 5 MG tablet Commonly known as:  FLEXERIL Take 1 tablet (5 mg total) by mouth 2 (two) times daily. What changed:    when to take this  reasons to take this   diphenhydrAMINE 25 MG tablet Commonly known as:  BENADRYL Take 25 mg by mouth every 6 (six) hours as needed for itching.   flurazepam 15 MG capsule Commonly known as:  DALMANE Take 1 capsule (15 mg total) by mouth at bedtime as needed for sleep.   HYDROcodone-acetaminophen 5-325 MG tablet Commonly known as:  NORCO/VICODIN Take 1 tablet by mouth 4 (four) times daily.   loperamide 2 MG capsule Commonly known as:  IMODIUM Take 1 capsule (2 mg total) by mouth every 6 (six) hours as needed for diarrhea or loose stools.   NOVOLOG MIX 70/30 FLEXPEN (70-30) 100 UNIT/ML FlexPen Generic drug:  insulin aspart protamine - aspart Inject 15 Units into the skin 2 (two) times daily.   ondansetron 4 MG disintegrating tablet Commonly known as:  ZOFRAN-ODT Take 1 tablet (4 mg total) by mouth every 8 (eight) hours as needed for nausea or vomiting.       If you experience worsening of your admission symptoms, develop shortness of breath, life threatening emergency, suicidal or homicidal thoughts you must seek medical attention immediately by calling 911 or calling your MD immediately  if symptoms less severe.  You Must read complete instructions/literature along with all the possible adverse reactions/side effects for all the Medicines you take and that have been prescribed to you. Take any new Medicines after you have completely understood and accept all the  possible adverse reactions/side effects.   Please note  You were cared for by a hospitalist during your hospital stay. If you have any questions about your discharge medications or the care you received while you were in the hospital after you are discharged, you can call the unit and asked to speak with the hospitalist on call if the hospitalist that took care of you is not available. Once you are discharged, your primary care physician will handle any further medical issues. Please note that NO REFILLS for any discharge medications will be authorized once you are discharged, as it is imperative that you return to your primary care physician (or establish a relationship with a primary care physician if you do not have one) for your aftercare needs so that they can reassess your need for medications and monitor your lab values. Today   SUBJECTIVE   Complains of throat pain and some diarrhea. No abdominal pain or vomiting.  VITAL SIGNS:  Blood pressure 137/66, pulse 83, temperature 98.2 F (36.8 C), temperature source Oral, resp. rate 20, height 5\' 1"  (1.549 m), weight 45.4 kg, SpO2 100 %.  I/O:    Intake/Output Summary (Last 24 hours) at 11/13/2017 0904 Last data filed at 11/13/2017 0743 Gross per 24 hour  Intake 339.94 ml  Output 600 ml  Net -260.06 ml    PHYSICAL EXAMINATION:  GENERAL:  62 y.o.-year-old patient lying in the bed with no acute distress.  EYES: Pupils equal, round, reactive to light and accommodation. No scleral icterus. Extraocular muscles intact.  HEENT: Head atraumatic, normocephalic. Oropharynx and nasopharynx clear.  NECK:  Supple, no jugular venous distention. No thyroid enlargement, no tenderness.  LUNGS: Normal breath sounds bilaterally, no wheezing, rales,rhonchi or crepitation. No use of accessory muscles of respiration.  CARDIOVASCULAR: S1, S2 normal. No murmurs, rubs, or gallops.  ABDOMEN: Soft, non-tender, non-distended. Bowel sounds present. No  organomegaly or mass.  EXTREMITIES: No pedal edema, cyanosis, or clubbing.  NEUROLOGIC: Cranial nerves II through XII are intact. Muscle strength 5/5 in all extremities. Sensation intact. Gait not checked.  PSYCHIATRIC: The patient is alert and oriented x 3.  SKIN: No obvious rash, lesion, or ulcer.   DATA REVIEW:   CBC  Recent Labs  Lab 11/12/17 0621  WBC 10.5  HGB 11.8*  HCT 35.2*  PLT 244    Chemistries  Recent Labs  Lab 11/11/17 1332 11/12/17 0621  NA 135 141  K 3.3* 3.7  CL 97* 108  CO2 23 24  GLUCOSE 330* 157*  BUN 29* 28*  CREATININE 1.03* 1.10*  CALCIUM 8.4* 7.9*  AST 15  --   ALT 25  --   ALKPHOS 226*  --   BILITOT 1.5*  --     Microbiology Results   No results found for this or any previous visit (from the past 240 hour(s)).  RADIOLOGY:  No results found.   Management plans discussed with the patient, family and they are in agreement.  CODE STATUS:     Code Status Orders  (From admission, onward)         Start     Ordered   11/11/17 1416  Full code  Continuous     11/11/17 1416        Code Status History    Date Active Date Inactive Code Status Order ID Comments User Context   04/25/2017 1039 04/26/2017 1415 Full Code [id number]  Schuyler Amor, MD ED   01/03/2017 1730 01/07/2017 1531 Full Code [id number]  Idelle Crouch, MD Inpatient   12/10/2016 1201 12/12/2016 1712 Full Code [id number]  Demetrios Loll, MD Inpatient   10/15/2016 2350 10/17/2016 0056 Full Code [id number]  Dustin Flock, MD Inpatient   04/21/2016 0505 04/24/2016 2031 Full Code [id number]  Saundra Shelling, MD Inpatient   12/31/2015 0739 01/01/2016 2008 Full Code [id number]  Saundra Shelling, MD Inpatient   12/10/2015 1109 12/12/2015 1848 Full Code [id number]  Demetrios Loll, MD Inpatient   10/29/2015 1545 11/01/2015 1623 Full Code [id number]  Theodoro Grist, MD Inpatient   02/17/2015 1704 02/19/2015 1804 Full Code [id number]  Nicholes Mango, MD Inpatient      TOTAL TIME TAKING CARE OF THIS  PATIENT:  40 minutes.    Karen [name] M.D on 11/13/2017 at 9:04 AM  Between 7am to 6pm - Pager - [phone] After 6pm go to [email or web address] - password [email or web address] [facility]  Office  [phone]  CC: Primary care physician; Novella Rob, FNP   Patient ID: Karen [name], female   DOB: 1955/03/08, 62 y.o.   MRN: [id number]

## 2017-11-15 ENCOUNTER — Encounter: Payer: Self-pay | Admitting: Gastroenterology

## 2017-11-16 ENCOUNTER — Telehealth: Payer: Self-pay

## 2017-11-16 NOTE — Telephone Encounter (Signed)
Received referral from Dr. 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 Called home number and received busy signal. Called listed mobile and it was the voicemail of listed friend on demographics. Left HIPPA appropriate voicemail. He returned call and he will have her call me once he gets off work and gets home. EUS can be scheduled for 12/19. Date has been approved by Dr. Marius Ditch, I will await call from Ms. Terlecki. Oncology Nurse Navigator Documentation  Navigator Location: [facility] (11/16/17 1400)   )Navigator Encounter Type: Telephone (11/16/17 1400) Telephone: Incoming Call;Outgoing Call (11/16/17 1400)                       Barriers/Navigation Needs: Coordination of Care (11/16/17 1400)   Interventions: Coordination of Care (11/16/17 1400)   Coordination of Care: EUS (11/16/17 1400)                  Time Spent with Patient: 15 (11/16/17 1400)

## 2017-11-18 ENCOUNTER — Telehealth: Payer: Self-pay

## 2017-11-18 LAB — SURGICAL PATHOLOGY

## 2017-11-18 NOTE — Telephone Encounter (Signed)
Ms. Karen [name] has returned call. I returned her call using phone number provided and Karen [name], her listed contact, answered. This is his cell and he is at work. He will try and contact her to return my call. Oncology Nurse Navigator Documentation  Navigator Location: [facility] (11/18/17 0900)   )Navigator Encounter Type: Telephone (11/18/17 0900) Telephone: Karen [name] Call (11/18/17 0900)                       Barriers/Navigation Needs: Coordination of Care (11/18/17 0900)   Interventions: Coordination of Care (11/18/17 0900)   Coordination of Care: EUS (11/18/17 0900)                  Time Spent with Patient: 15 (11/18/17 0900)

## 2017-11-19 ENCOUNTER — Other Ambulatory Visit: Payer: Self-pay

## 2017-11-19 DIAGNOSIS — A048 Other specified bacterial intestinal infections: Secondary | ICD-10-CM

## 2017-11-19 MED ORDER — CLARITHROMYCIN 250 MG PO TABS: 250.0000 mg | ORAL_TABLET | Freq: Two times a day (BID) | ORAL | 0 refills | Status: AC

## 2017-11-19 MED ORDER — OMEPRAZOLE 40 MG PO CPDR: 40.0000 mg | DELAYED_RELEASE_CAPSULE | Freq: Two times a day (BID) | ORAL | 0 refills | Status: DC

## 2017-11-19 MED ORDER — METRONIDAZOLE 500 MG PO TABS: 500.0000 mg | ORAL_TABLET | Freq: Two times a day (BID) | ORAL | 0 refills | Status: AC

## 2017-11-22 ENCOUNTER — Telehealth: Payer: Self-pay

## 2017-11-22 NOTE — Telephone Encounter (Signed)
Call placed to Karen [name]. Her friend answered to call and he is at work until 6pm. Asked him to get her to call during business hours if possible to arrange EUS requested by Dr. Marius Ditch. He stated he would get her to call. Oncology Nurse Navigator Documentation  Navigator Location: [facility] (11/22/17 1600)   )Navigator Encounter Type: Telephone (11/22/17 1600) Telephone: Outgoing Call (11/22/17 1600)                       Barriers/Navigation Needs: Coordination of Care (11/22/17 1600)   Interventions: Coordination of Care (11/22/17 1600)   Coordination of Care: EUS (11/22/17 1600)                  Time Spent with Patient: 15 (11/22/17 1600)

## 2017-11-23 NOTE — Discharge Summary (Signed)
[facility] [place] NAME: Karen [name]       MR#:  [id number]     DATE OF BIRTH:  June 17, 1955     DATE OF ADMISSION:  11/11/2017    ADMITTING PHYSICIAN: Hillary Bow, MD     DATE OF DISCHARGE: 11/13/2017     PRIMARY CARE PHYSICIAN: Novella Rob, FNP          ADMISSION DIAGNOSIS:    Hypokalemia [E87.6]  Hematemesis with nausea [K92.0]  Syncope, unspecified syncope type [R55]  Idiopathic acute pancreatitis, unspecified complication status [V76.16]      DISCHARGE DIAGNOSIS:    coffee ground emesis suspected due to gastritis acute from naproxen/prednisone      SECONDARY DIAGNOSIS:            Past Medical History:    Diagnosis   Date    .   Alcoholic pancreatitis        .   Asthma        .   Depression        .   Diabetes mellitus without complication ([place])        .   DKA (diabetic ketoacidoses) ([place])   02/17/2015    .   Emphysematous cystitis        .   Hypercholesteremia        .   Hypertension        .   Hypokalemia               HOSPITAL COURSE:    Karen [name]  is a 62 y.o. female with a known history of alcohol abuse, hypertension, diabetes mellitus presents to the emergency room due to vomiting and hematemesis.  Patient was seen earlier in the day mild abdominal pain and discharged home.  She was recently started on naproxen for pain.  Patient does have chronic pain syndrome.  Her hemoglobin has trended down from 13 point 3 in the morning to 12.3.  No melena or hematochezia.  Lipase is mildly elevated.      * Upper GI bleed likely secondary to gastritis from l/naproxen.    -Counseled to quit alcohol-- patient states she has not drank alcohol in a while. She takes NyQuil for her cough which likely has some amount.  -Her serum alcohol level is negative.  -  Consult GI-- with Dr. Marius Ditch . EGD within normal limits.  Biopsy results should be followed by G.I. as outpatient  -IV Protonix.  No blood thinners.   -No major drop in hemoglobin.  -Soft diet.   -Having Some diarrhea. Will give her Imodium.     *Uncontrolled hypertension.  Due to noncompliance with medications.-- Pressure much improved  -  Hydralazine IV as needed   -resume home meds     *Diabetes mellitus.  Takes 70/30 at home.   -Continue sliding scale insulin Lantus. Patient will resume her home does insulin.  -Advised to keep sugar checks.     *Patient has history of alcohol abuse. She reports not drinking alcohol lately. Her LFTs are normal. Serum alcohol level times two negative.  -Continue to monitor for symptoms of withdrawal.     *DVT prophylaxis with SCDs     If continues to show improvement discharged home later today   CONSULTS OBTAINED:    Treatment Team:   Lin Landsman, MD      DRUG ALLERGIES:  Allergies    Allergen   Reactions    .   Penicillins   Anaphylaxis            Has patient had a PCN reaction causing immediate rash, facial/tongue/throat swelling, SOB or lightheadedness with hypotension: Yes  Has patient had a PCN reaction causing severe rash involving mucus membranes or skin necrosis: No  Has patient had a PCN reaction that required hospitalization No  Has patient had a PCN reaction occurring within the last 10 years: No  If all of the above answers are "NO", then may proceed with Cephalosporin use.    .   Reglan [Metoclopramide]   Other (See Comments)            Hypotension, shortness of breath    .   Percocet [Oxycodone-Acetaminophen]   Rash           DISCHARGE MEDICATIONS:             Allergies as of 11/13/2017                 Reactions         Penicillins   Anaphylaxis        Has patient had a PCN reaction causing immediate rash, facial/tongue/throat swelling, SOB or lightheadedness  with hypotension: Yes  Has patient had a PCN reaction causing severe rash involving mucus membranes or skin necrosis: No  Has patient had a PCN reaction that required hospitalization No  Has patient had a PCN reaction occurring within the last 10 years: No  If all of the above answers are "NO", then may proceed with Cephalosporin use.        Reglan [metoclopramide]   Other (See Comments)        Hypotension, shortness of breath        Percocet [oxycodone-acetaminophen]   Rash                    Medication List          STOP taking these medications      naproxen 500 MG tablet  Commonly known as:  NAPROSYN       predniSONE 50 MG tablet  Commonly known as:  DELTASONE              TAKE these medications      amLODipine 5 MG tablet  Commonly known as:  NORVASC  Take 5 mg by mouth daily.       atorvastatin 40 MG tablet  Commonly known as:  LIPITOR  Take 40 mg by mouth at bedtime.       cyclobenzaprine 5 MG tablet  Commonly known as:  FLEXERIL  Take 1 tablet (5 mg total) by mouth 2 (two) times daily.  What changed:    .when to take this   .reasons to take this        diphenhydrAMINE 25 MG tablet  Commonly known as:  BENADRYL  Take 25 mg by mouth every 6 (six) hours as needed for itching.       flurazepam 15 MG capsule  Commonly known as:  DALMANE  Take 1 capsule (15 mg total) by mouth at bedtime as needed for sleep.       HYDROcodone-acetaminophen 5-325 MG tablet  Commonly known as:  NORCO/VICODIN  Take 1 tablet by mouth 4 (four) times daily.       loperamide 2 MG capsule  Commonly known as:  IMODIUM  Take 1 capsule (2 mg  total) by mouth every 6 (six) hours as needed for diarrhea or loose stools.       NOVOLOG MIX 70/30 FLEXPEN (70-30) 100 UNIT/ML FlexPen  Generic drug:  insulin aspart protamine - aspart  Inject 15 Units into the skin 2 (two) times daily.        ondansetron 4 MG disintegrating tablet  Commonly known as:  ZOFRAN-ODT  Take 1 tablet (4 mg total) by mouth every 8 (eight) hours as needed for nausea or vomiting.                   If you experience worsening of your admission symptoms, develop shortness of breath, life threatening emergency, suicidal or homicidal thoughts you must seek medical attention immediately by calling 911 or calling your MD immediately  if symptoms less severe.     You Must read complete instructions/literature along with all the possible adverse reactions/side effects for all the Medicines you take and that have been prescribed to you. Take any new Medicines after you have completely understood and accept all the possible adverse reactions/side effects.      Please note     You were cared for by a hospitalist during your hospital stay. If you have any questions about your discharge medications or the care you received while you were in the hospital after you are discharged, you can call the unit and asked to speak with the hospitalist on call if the hospitalist that took care of you is not available. Once you are discharged, your primary care physician will handle any further medical issues. Please note that NO REFILLS for any discharge medications will be authorized once you are discharged, as it is imperative that you return to your primary care physician (or establish a relationship with a primary care physician if you do not have one) for your aftercare needs so that they can reassess your need for medications and monitor your lab values.  Today       SUBJECTIVE       Complains of throat pain and some diarrhea. No abdominal pain or vomiting.      VITAL SIGNS:    Blood pressure 137/66, pulse 83, temperature 98.2 F (36.8 C), temperature source Oral, resp. rate 20, height 5\' 1"  (1.549 m), weight 45.4 kg, SpO2 100 %.      I/O:          Intake/Output Summary (Last 24  hours) at 11/13/2017 0904  Last data filed at 11/13/2017 0743      Gross per 24 hour    Intake   339.94 ml    Output   600 ml    Net   -260.06 ml           PHYSICAL EXAMINATION:    GENERAL:  62 y.o.-year-old patient lying in the bed with no acute distress.   EYES: Pupils equal, round, reactive to light and accommodation. No scleral icterus. Extraocular muscles intact.   HEENT: Head atraumatic, normocephalic. Oropharynx and nasopharynx clear.   NECK:  Supple, no jugular venous distention. No thyroid enlargement, no tenderness.   LUNGS: Normal breath sounds bilaterally, no wheezing, rales,rhonchi or crepitation. No use of accessory muscles of respiration.   CARDIOVASCULAR: S1, S2 normal. No murmurs, rubs, or gallops.   ABDOMEN: Soft, non-tender, non-distended. Bowel sounds present. No organomegaly or mass.   EXTREMITIES: No pedal edema, cyanosis, or clubbing.   NEUROLOGIC: Cranial nerves II through XII are intact. Muscle strength 5/5 in all extremities. Sensation  intact. Gait not checked.   PSYCHIATRIC: The patient is alert and oriented x 3.   SKIN: No obvious rash, lesion, or ulcer.       DATA REVIEW:       CBC   Last Labs                                                          Chemistries   Last Labs                                                                                                                                                            Microbiology Results      No results found for this or any previous visit (from the past 240 hour(s)).      RADIOLOGY:     Imaging Results (Last 48 hours)             Management plans discussed with the patient, family and they are in agreement.     CODE STATUS:                       Code Status Orders    (From  admission, onward)                                                      Start           Ordered        11/11/17 1416        Full code  Continuous        11/11/17 1416                                         Code Status History            Date Active    Date Inactive    Code Status    Order ID    Comments    User    Context         04/25/2017 1039   04/26/2017 1415   Full Code   [id number]       Schuyler Amor, MD   ED        01/03/2017 1730   01/07/2017 1531   Full Code   [id number]       Idelle Crouch, MD  Inpatient        12/10/2016 1201   12/12/2016 1712   Full Code   [id number]       Demetrios Loll, MD   Inpatient        10/15/2016 2350   10/17/2016 0056   Full Code   [id number]       Dustin Flock, MD   Inpatient        04/21/2016 0505   04/24/2016 2031   Full Code   [id number]       Saundra Shelling, MD   Inpatient        12/31/2015 0739   01/01/2016 2008   Full Code   [id number]       Saundra Shelling, MD   Inpatient        12/10/2015 1109   12/12/2015 1848   Full Code   [id number]       Demetrios Loll, MD   Inpatient        10/29/2015 1545   11/01/2015 1623   Full Code   [id number]       Theodoro Grist, MD   Inpatient        02/17/2015 1704   02/19/2015 1804   Full Code   [id number]       Nicholes Mango, MD   Inpatient                 TOTAL TIME TAKING CARE OF THIS PATIENT:  40 minutes.         Fritzi Mandes M.D on 11/13/2017 at 9:04 AM     Between 7am to 6pm - Pager - [phone]  After 6pm go to [email or web address] - password [email or web address] [facility]   Office  [phone]     CC:  Primary care physician; Novella Rob, FNP        Patient ID: Karen [name], female   DOB: November 20, 1955, 62 y.o.   MRN: [id number]

## 2017-11-23 NOTE — Discharge Summary (Signed)
PATIENT NAME: Karen [name]    MR#:  [id number]  DATE OF BIRTH:  11-14-1955  DATE OF ADMISSION:  11/11/2017    ADMITTING PHYSICIAN: Hillary Bow, MD  DATE OF DISCHARGE: 11/13/2017  PRIMARY CARE PHYSICIAN: Novella Rob, FNP    ADMISSION DIAGNOSIS:  Hypokalemia [E87.6] Hematemesis with nausea [K92.0] Syncope, unspecified syncope type [R55] Idiopathic acute pancreatitis, unspecified complication status [G29.52]  DISCHARGE DIAGNOSIS:  coffee ground emesis suspected due to gastritis acute from naproxen/prednisone  SECONDARY DIAGNOSIS:       Past Medical History:  Diagnosis Date  . Alcoholic pancreatitis   . Asthma   . Depression   . Diabetes mellitus without complication ([place])   . DKA (diabetic ketoacidoses) ([place]) 02/17/2015  . Emphysematous cystitis   . Hypercholesteremia   . Hypertension   . Hypokalemia     HOSPITAL COURSE:  Karen [name] a62 y.o.femalewith a known history of alcohol abuse,hypertension, diabetes mellitus presents to the emergency room due to vomiting and hematemesis. Patient was seen earlier in the day mild abdominal pain and discharged home. She was recently started on naproxen for pain. Patient does have chronic pain syndrome. Her hemoglobin has trended down from 13 point 3 in the morning to 12.3. No melena or hematochezia. Lipase is mildly elevated.   * Upper GI bleed likely secondary to gastritis from l/naproxen.  -Counseled to quit alcohol--patient states she has not drank alcohol in a while. She takes NyQuil for her cough which likely has some amount. -Her serum alcohol level is negative. - Consult GI--with Dr. Marius Ditch .EGD within normal limits. Biopsy results should be followed by G.I. as outpatient -IV Protonix. No blood thinners. -No major drop in hemoglobin. -Soft diet. -Having Some diarrhea. Will give her Imodium.  *Uncontrolled hypertension. Due to noncompliance with medications.-- Pressure much  improved - Hydralazine IV as needed -resume home meds  *Diabetes mellitus. Takes 70/30 at home.  -Continue sliding scale insulin Lantus. Patient will resume her home does insulin. -Advised to keep sugar checks.  *Patient has history of alcohol abuse. She reports not drinking alcohol lately. Her LFTs are normal. Serum alcohol level times two negative. -Continue to monitor for symptoms of withdrawal.  *DVT prophylaxis with SCDs  If continues to show improvement discharged home later today CONSULTS OBTAINED:  Treatment Team:  Lin Landsman, MD  DRUG ALLERGIES:        Allergies  Allergen Reactions  . Penicillins Anaphylaxis    Has patient had a PCN reaction causing immediate rash, facial/tongue/throat swelling, SOB or lightheadedness with hypotension: Yes Has patient had a PCN reaction causing severe rash involving mucus membranes or skin necrosis: No Has patient had a PCN reaction that required hospitalization No Has patient had a PCN reaction occurring within the last 10 years: No If all of the above answers are "NO", then may proceed with Cephalosporin use.  . Reglan [Metoclopramide] Other (See Comments)    Hypotension, shortness of breath  . Percocet [Oxycodone-Acetaminophen] Rash    DISCHARGE MEDICATIONS:        Allergies as of 11/13/2017      Reactions   Penicillins Anaphylaxis   Has patient had a PCN reaction causing immediate rash, facial/tongue/throat swelling, SOB or lightheadedness with hypotension: Yes Has patient had a PCN reaction causing severe rash involving mucus membranes or skin necrosis: No Has patient had a PCN reaction that required hospitalization No Has patient had a PCN reaction occurring within the last 10 years: No If all of the above answers are "NO", then  may proceed with Cephalosporin use.   Reglan [metoclopramide] Other (See Comments)   Hypotension, shortness of breath   Percocet [oxycodone-acetaminophen] Rash          Medication List    STOP taking these medications   naproxen 500 MG tablet Commonly known as:  NAPROSYN   predniSONE 50 MG tablet Commonly known as:  DELTASONE     TAKE these medications   amLODipine 5 MG tablet Commonly known as:  NORVASC Take 5 mg by mouth daily.   atorvastatin 40 MG tablet Commonly known as:  LIPITOR Take 40 mg by mouth at bedtime.   cyclobenzaprine 5 MG tablet Commonly known as:  FLEXERIL Take 1 tablet (5 mg total) by mouth 2 (two) times daily. What changed:    when to take this  reasons to take this   diphenhydrAMINE 25 MG tablet Commonly known as:  BENADRYL Take 25 mg by mouth every 6 (six) hours as needed for itching.   flurazepam 15 MG capsule Commonly known as:  DALMANE Take 1 capsule (15 mg total) by mouth at bedtime as needed for sleep.   HYDROcodone-acetaminophen 5-325 MG tablet Commonly known as:  NORCO/VICODIN Take 1 tablet by mouth 4 (four) times daily.   loperamide 2 MG capsule Commonly known as:  IMODIUM Take 1 capsule (2 mg total) by mouth every 6 (six) hours as needed for diarrhea or loose stools.   NOVOLOG MIX 70/30 FLEXPEN (70-30) 100 UNIT/ML FlexPen Generic drug:  insulin aspart protamine - aspart Inject 15 Units into the skin 2 (two) times daily.   ondansetron 4 MG disintegrating tablet Commonly known as:  ZOFRAN-ODT Take 1 tablet (4 mg total) by mouth every 8 (eight) hours as needed for nausea or vomiting.       If you experience worsening of your admission symptoms, develop shortness of breath, life threatening emergency, suicidal or homicidal thoughts you must seek medical attention immediately by calling 911 or calling your MD immediately  if symptoms less severe.  You Must read complete instructions/literature along with all the possible adverse reactions/side effects for all the Medicines you take and that have been prescribed to you. Take any new Medicines after you have completely  understood and accept all the possible adverse reactions/side effects.   Please note  You were cared for by a hospitalist during your hospital stay. If you have any questions about your discharge medications or the care you received while you were in the hospital after you are discharged, you can call the unit and asked to speak with the hospitalist on call if the hospitalist that took care of you is not available. Once you are discharged, your primary care physician will handle any further medical issues. Please note that NO REFILLS for any discharge medications will be authorized once you are discharged, as it is imperative that you return to your primary care physician (or establish a relationship with a primary care physician if you do not have one) for your aftercare needs so that they can reassess your need for medications and monitor your lab values. Today   SUBJECTIVE   Complains of throat pain and some diarrhea. No abdominal pain or vomiting.  VITAL SIGNS:  Blood pressure 137/66, pulse 83, temperature 98.2 F (36.8 C), temperature source Oral, resp. rate 20, height 5\' 1"  (1.549 m), weight 45.4 kg, SpO2 100 %.  I/O:    Intake/Output Summary (Last 24 hours) at 11/13/2017 0904 Last data filed at 11/13/2017 0743    Gross  per 24 hour  Intake 339.94 ml  Output 600 ml  Net -260.06 ml    PHYSICAL EXAMINATION:  GENERAL:  62 y.o.-year-old patient lying in the bed with no acute distress.  EYES: Pupils equal, round, reactive to light and accommodation. No scleral icterus. Extraocular muscles intact.  HEENT: Head atraumatic, normocephalic. Oropharynx and nasopharynx clear.  NECK:  Supple, no jugular venous distention. No thyroid enlargement, no tenderness.  LUNGS: Normal breath sounds bilaterally, no wheezing, rales,rhonchi or crepitation. No use of accessory muscles of respiration.  CARDIOVASCULAR: S1, S2 normal. No murmurs, rubs, or gallops.  ABDOMEN: Soft, non-tender,  non-distended. Bowel sounds present. No organomegaly or mass.  EXTREMITIES: No pedal edema, cyanosis, or clubbing.  NEUROLOGIC: Cranial nerves II through XII are intact. Muscle strength 5/5 in all extremities. Sensation intact. Gait not checked.  PSYCHIATRIC: The patient is alert and oriented x 3.  SKIN: No obvious rash, lesion, or ulcer.   DATA REVIEW:   CBC  Last Labs      Recent Labs  Lab 11/12/17 0621  WBC 10.5  HGB 11.8*  HCT 35.2*  PLT 244      Chemistries  Last Labs       Recent Labs  Lab 11/11/17 1332 11/12/17 0621  NA 135 141  K 3.3* 3.7  CL 97* 108  CO2 23 24  GLUCOSE 330* 157*  BUN 29* 28*  CREATININE 1.03* 1.10*  CALCIUM 8.4* 7.9*  AST 15  --   ALT 25  --   ALKPHOS 226*  --   BILITOT 1.5*  --       Microbiology Results   No results found for this or any previous visit (from the past 240 hour(s)).  RADIOLOGY:  Imaging Results (Last 48 hours)  No results found.     Management plans discussed with the patient, family and they are in agreement.  CODE STATUS:               Code Status Orders  (From admission, onward)                                    Start     Ordered    11/11/17 1416  Full code  Continuous     11/11/17 1416                             Code Status History    Date Active Date Inactive Code Status Order ID Comments User Context   04/25/2017 1039 04/26/2017 1415 Full Code [id number]  Schuyler Amor, MD ED   01/03/2017 1730 01/07/2017 1531 Full Code [id number]  Idelle Crouch, MD Inpatient   12/10/2016 1201 12/12/2016 1712 Full Code [id number]  Demetrios Loll, MD Inpatient   10/15/2016 2350 10/17/2016 0056 Full Code [id number]  Dustin Flock, MD Inpatient   04/21/2016 0505 04/24/2016 2031 Full Code [id number]  Saundra Shelling, MD Inpatient   12/31/2015 0739 01/01/2016 2008 Full Code [id number]  Saundra Shelling, MD Inpatient   12/10/2015 1109 12/12/2015 1848 Full Code [id number]  Demetrios Loll, MD  Inpatient   10/29/2015 1545 11/01/2015 1623 Full Code [id number]  Theodoro Grist, MD Inpatient   02/17/2015 1704 02/19/2015 1804 Full Code [id number]  Nicholes Mango, MD Inpatient      TOTAL TIME TAKING CARE OF THIS PATIENT:  40 minutes.    Fritzi Mandes M.D on 11/13/2017  at 9:04 AM  Between 7am to 6pm - Pager - [phone] After 6pm go to [email or web address] - password [email or web address] [facility]  Office  [phone]  CC: Primary care physician; Novella Rob, FNP

## 2017-11-25 ENCOUNTER — Ambulatory Visit: Payer: Medicaid Other | Admitting: Gastroenterology

## 2017-11-25 ENCOUNTER — Encounter: Payer: Self-pay | Admitting: Gastroenterology

## 2017-11-29 ENCOUNTER — Other Ambulatory Visit: Payer: Self-pay

## 2017-11-29 ENCOUNTER — Telehealth: Payer: Self-pay

## 2017-11-29 DIAGNOSIS — R198 Other specified symptoms and signs involving the digestive system and abdomen: Secondary | ICD-10-CM | POA: Insufficient documentation

## 2017-11-29 NOTE — Telephone Encounter (Signed)
I have been unable to reach Karen [name] to arrange EUS. Dr. Verlin Grills office has verified that 12/23/17 will work for her. I will mail her all of the appropriate education and instructions.  INSTRUCTIONS FOR ENDOSCOPIC ULTRASOUND -Your procedure has been scheduled for December 19th with Dr. Francella Solian at  [facility]. -The hospital may contact you to pre-register over the phone.  -To get your scheduled arrival time, please call the Endoscopy unit at  [phone] between 1-3 p.m. on:  December 18th   -ON THE DAY OF YOU PROCEDURE:   1. If you are scheduled for a morning procedure, nothing to drink after midnight  -If you are scheduled for an afternoon procedure, you may have clear liquids until 5 hours prior  to the procedure but no carbonated drinks or broth  2. NO FOOD THE DAY OF YOUR PROCEDURE  3. You may take your heart, seizure, blood pressure, Parkinson's or breathing medications at  6am with just enough water to get your pills down  4. Do not take any oral Diabetic medications the morning of your procedure.  5. If you are a diabetic and are using insulin, please notify your prescribing physician of this  procedure, as your dose may need to be altered related to not being able to eat or drink.   6. Do not take vitamins, iron, or fish oil for 5 days before your procedure     -On the day of your procedure, come to the [facility] Admitting/Registration desk (First desk on the right) at the scheduled arrival time. You MUST have someone drive you home from your procedure. You must have a responsible adult with a valid driver's license who is on site throughout your entire procedure and who can stay with you for several hours after your procedure. You may not go home alone in a taxi, shuttle [place] or bus, as the drivers will not be responsible for you.  --If you have any questions please call me at the above contact  Oncology Nurse Navigator Documentation  Navigator  Location: [facility] (11/29/17 1100)   )Navigator Encounter Type: Other;Letter/Fax/Email (11/29/17 1100) Telephone: Education (11/29/17 1100)                       Barriers/Navigation Needs: Coordination of Care (11/29/17 1100)   Interventions: Coordination of Care (11/29/17 1100)   Coordination of Care: EUS (11/29/17 1100)                  Time Spent with Patient: 15 (11/29/17 1100)

## 2017-12-22 ENCOUNTER — Encounter: Payer: Self-pay | Admitting: *Deleted

## 2017-12-22 ENCOUNTER — Telehealth: Payer: Self-pay

## 2017-12-22 NOTE — Telephone Encounter (Signed)
Received call from Endoscopy. They have been unable to reach Ms. Karen [name] for her EUS tomorrow. She called and her friend answered and told them he was at work and could not help them and hung up. At this time, since they cannot reach her, we will cancel physician coming from [facility] tomorrow. I have notified Dr. Marius Ditch via secure chat and notified Dr. Francella Solian. Oncology Nurse Navigator Documentation  Navigator Location: [facility] (12/22/17 1500)   )Navigator Encounter Type: Telephone (12/22/17 1500) Telephone: Incoming Call (12/22/17 1500)                                                  Time Spent with Patient: 15 (12/22/17 1500)

## 2017-12-23 ENCOUNTER — Encounter: Admission: RE | Payer: Self-pay | Source: Home / Self Care

## 2017-12-23 ENCOUNTER — Ambulatory Visit: Admission: RE | Admit: 2017-12-23 | Payer: Medicaid Other | Source: Home / Self Care | Admitting: Gastroenterology

## 2017-12-23 SURGERY — ULTRASOUND, UPPER GI TRACT, ENDOSCOPIC
Anesthesia: General

## 2018-01-24 DIAGNOSIS — E1165 Type 2 diabetes mellitus with hyperglycemia: Secondary | ICD-10-CM | POA: Insufficient documentation

## 2018-01-24 DIAGNOSIS — N390 Urinary tract infection, site not specified: Secondary | ICD-10-CM

## 2018-01-24 HISTORY — DX: Urinary tract infection, site not specified: N39.0

## 2018-03-12 ENCOUNTER — Emergency Department
Admission: EM | Admit: 2018-03-12 | Discharge: 2018-03-12 | Disposition: A | Discharge status: Home / Self Care | Payer: Medicaid Other | Attending: Emergency Medicine | Admitting: Emergency Medicine

## 2018-03-12 ENCOUNTER — Emergency Department: Payer: Medicaid Other

## 2018-03-12 ENCOUNTER — Other Ambulatory Visit: Payer: Self-pay

## 2018-03-12 DIAGNOSIS — R2243 Localized swelling, mass and lump, lower limb, bilateral: Secondary | ICD-10-CM | POA: Diagnosis not present

## 2018-03-12 DIAGNOSIS — E119 Type 2 diabetes mellitus without complications: Secondary | ICD-10-CM | POA: Diagnosis not present

## 2018-03-12 DIAGNOSIS — Z794 Long term (current) use of insulin: Secondary | ICD-10-CM | POA: Insufficient documentation

## 2018-03-12 DIAGNOSIS — Z87891 Personal history of nicotine dependence: Secondary | ICD-10-CM | POA: Insufficient documentation

## 2018-03-12 DIAGNOSIS — Y998 Other external cause status: Secondary | ICD-10-CM | POA: Insufficient documentation

## 2018-03-12 DIAGNOSIS — Z79899 Other long term (current) drug therapy: Secondary | ICD-10-CM | POA: Diagnosis not present

## 2018-03-12 DIAGNOSIS — I10 Essential (primary) hypertension: Secondary | ICD-10-CM | POA: Insufficient documentation

## 2018-03-12 DIAGNOSIS — J45909 Unspecified asthma, uncomplicated: Secondary | ICD-10-CM | POA: Diagnosis not present

## 2018-03-12 DIAGNOSIS — S51852A Open bite of left forearm, initial encounter: Secondary | ICD-10-CM | POA: Diagnosis not present

## 2018-03-12 DIAGNOSIS — R109 Unspecified abdominal pain: Secondary | ICD-10-CM | POA: Diagnosis not present

## 2018-03-12 DIAGNOSIS — Y9389 Activity, other specified: Secondary | ICD-10-CM | POA: Insufficient documentation

## 2018-03-12 DIAGNOSIS — L539 Erythematous condition, unspecified: Secondary | ICD-10-CM | POA: Insufficient documentation

## 2018-03-12 DIAGNOSIS — K860 Alcohol-induced chronic pancreatitis: Secondary | ICD-10-CM | POA: Diagnosis not present

## 2018-03-12 DIAGNOSIS — Y92019 Unspecified place in single-family (private) house as the place of occurrence of the external cause: Secondary | ICD-10-CM | POA: Insufficient documentation

## 2018-03-12 DIAGNOSIS — W5501XA Bitten by cat, initial encounter: Secondary | ICD-10-CM | POA: Insufficient documentation

## 2018-03-12 LAB — CBC
HCT: 31.9 % — ABNORMAL LOW (ref 36.0–46.0)
Hemoglobin: 10.4 g/dL — ABNORMAL LOW (ref 12.0–15.0)
MCH: 28.8 pg (ref 26.0–34.0)
MCHC: 32.6 g/dL (ref 30.0–36.0)
MCV: 88.4 fL (ref 80.0–100.0)
Platelets: 237 10*3/uL (ref 150–400)
RBC: 3.61 MIL/uL — ABNORMAL LOW (ref 3.87–5.11)
RDW: 11.9 % (ref 11.5–15.5)
WBC: 9.1 10*3/uL (ref 4.0–10.5)
nRBC: 0 % (ref 0.0–0.2)

## 2018-03-12 LAB — URINALYSIS, COMPLETE (UACMP) WITH MICROSCOPIC
Bilirubin Urine: NEGATIVE
Glucose, UA: 150 mg/dL — AB
HGB URINE DIPSTICK: NEGATIVE
Ketones, ur: NEGATIVE mg/dL
Leukocytes,Ua: NEGATIVE
NITRITE: NEGATIVE
Protein, ur: 30 mg/dL — AB
Specific Gravity, Urine: 1.008 (ref 1.005–1.030)
pH: 7 (ref 5.0–8.0)

## 2018-03-12 LAB — COMPREHENSIVE METABOLIC PANEL
ALT: 50 U/L — ABNORMAL HIGH (ref 0–44)
AST: 46 U/L — ABNORMAL HIGH (ref 15–41)
Albumin: 3.5 g/dL (ref 3.5–5.0)
Alkaline Phosphatase: 285 U/L — ABNORMAL HIGH (ref 38–126)
Anion gap: 9 (ref 5–15)
BUN: 33 mg/dL — AB (ref 8–23)
CO2: 27 mmol/L (ref 22–32)
Calcium: 8.8 mg/dL — ABNORMAL LOW (ref 8.9–10.3)
Chloride: 101 mmol/L (ref 98–111)
Creatinine, Ser: 1.41 mg/dL — ABNORMAL HIGH (ref 0.44–1.00)
GFR calc Af Amer: 46 mL/min — ABNORMAL LOW (ref >60)
GFR calc non Af Amer: 40 mL/min — ABNORMAL LOW (ref >60)
Glucose, Bld: 215 mg/dL — ABNORMAL HIGH (ref 70–99)
Potassium: 4.4 mmol/L (ref 3.5–5.1)
Sodium: 137 mmol/L (ref 135–145)
Total Bilirubin: 0.7 mg/dL (ref 0.3–1.2)
Total Protein: 7.4 g/dL (ref 6.5–8.1)

## 2018-03-12 LAB — GLUCOSE, CAPILLARY
Glucose-Capillary: 51 mg/dL — ABNORMAL LOW (ref 70–99)
Glucose-Capillary: 73 mg/dL (ref 70–99)
Glucose-Capillary: 227 mg/dL — ABNORMAL HIGH (ref 70–99)

## 2018-03-12 LAB — BRAIN NATRIURETIC PEPTIDE: B Natriuretic Peptide: 28 pg/mL (ref 0.0–100.0)

## 2018-03-12 LAB — LIPASE, BLOOD: Lipase: 123 U/L — ABNORMAL HIGH (ref 11–51)

## 2018-03-12 MED ORDER — PANCRELIPASE (LIP-PROT-AMYL) 36000-114000 UNITS PO CPEP: 36000.0000 [IU] | ORAL_CAPSULE | Freq: Two times a day (BID) | ORAL | 0 refills | Status: DC

## 2018-03-12 MED ORDER — IOHEXOL 300 MG/ML  SOLN
75.0000 mL | Freq: Once | INTRAMUSCULAR | Status: AC | PRN
  Administered 2018-03-12: 75 mL via INTRAVENOUS

## 2018-03-12 MED ORDER — SODIUM CHLORIDE 0.9 % IV SOLN
100.0000 mg | Freq: Once | INTRAVENOUS | Status: AC
  Administered 2018-03-12: 100 mg via INTRAVENOUS
  Filled 2018-03-12: qty 100

## 2018-03-12 MED ORDER — DOXYCYCLINE HYCLATE 100 MG PO CAPS: 100.0000 mg | ORAL_CAPSULE | Freq: Two times a day (BID) | ORAL | 0 refills | Status: DC

## 2018-03-12 MED ORDER — CLINDAMYCIN HCL 300 MG PO CAPS: 300.0000 mg | ORAL_CAPSULE | Freq: Three times a day (TID) | ORAL | 0 refills | Status: DC

## 2018-03-12 MED ORDER — PANCRELIPASE (LIP-PROT-AMYL) 36000-114000 UNITS PO CPEP: 72000.0000 [IU] | ORAL_CAPSULE | Freq: Three times a day (TID) | ORAL | 0 refills | Status: DC

## 2018-03-12 MED ORDER — ONDANSETRON HCL 4 MG/2ML IJ SOLN
4.0000 mg | Freq: Once | INTRAMUSCULAR | Status: AC
  Administered 2018-03-12: 4 mg via INTRAVENOUS
  Filled 2018-03-12: qty 2

## 2018-03-12 MED ORDER — MORPHINE SULFATE (PF) 4 MG/ML IV SOLN
4.0000 mg | Freq: Once | INTRAVENOUS | Status: AC
  Administered 2018-03-12: 4 mg via INTRAVENOUS
  Filled 2018-03-12: qty 1

## 2018-03-12 MED ORDER — CLINDAMYCIN PHOSPHATE 300 MG/50ML IV SOLN
300.0000 mg | Freq: Once | INTRAVENOUS | Status: AC
  Administered 2018-03-12: 300 mg via INTRAVENOUS
  Filled 2018-03-12: qty 50

## 2018-03-12 MED ORDER — IOPAMIDOL (ISOVUE-300) INJECTION 61%
15.0000 mL | INTRAVENOUS | Status: AC
  Administered 2018-03-12 (×2): 15 mL via ORAL

## 2018-03-12 NOTE — ED Provider Notes (Signed)
[facility] Emergency Department Provider Note ____________________________________________   First MD Initiated Contact with Patient 03/12/18 [phone]     (approximate)  I have reviewed the triage vital signs and the nursing notes.  HISTORY  Chief Complaint Abdominal Pain  HPI Karen [name] is a 63 y.o. female here for evaluation of abdominal pain  Patient reports she began experiencing a feeling like a tightness in her abdomen and pain and swelling beginning about a week ago.  This continuously progressed for about a week now and she is also noticed that both legs seem a bit more swollen than normal.  She reports the pain is like a tightness across her entire abdomen.  She is having normal bowel movements and has not been vomiting.  She does not have any nausea.  She reports she discontinued alcohol use several months ago.  She does however also reports that she was a very heavy alcohol user for about the age of 48 in the past  She was hospitalized at [facility] she says about a month ago and diagnosed with a possible migraine  reports that is much improved.  Past Medical History:  Diagnosis Date  . Alcoholic pancreatitis   . Asthma   . Depression   . Diabetes mellitus without complication ([place])   . DKA (diabetic ketoacidoses) ([place]) 02/17/2015  . Emphysematous cystitis   . Hypercholesteremia   . Hypertension   . Hypokalemia     Patient Active Problem List   Diagnosis Date Noted  . 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 Coffee ground emesis   . Upper GI bleed 11/11/2017  . Pancreatitis, acute 01/03/2017  . ETOH abuse 01/03/2017  . UTI (urinary tract infection) 01/03/2017  . Nausea and vomiting   . Esophageal candidiasis ([place])   . Intractable vomiting with nausea   . Hypertensive urgency 04/21/2016  . Abdominal pain   . Acute renal failure ([place])   . Nausea vomiting and diarrhea   . Smoker   . Poorly controlled type 2 diabetes mellitus ([place])     . Acute renal insufficiency 10/29/2015  . Elevated troponin 10/29/2015  . Urinary tract infection without hematuria 10/29/2015  . Left flank pain 10/29/2015  . Malignant essential hypertension 10/29/2015  . DKA (diabetic ketoacidoses) ([place]) 02/17/2015  . Emphysematous cystitis     Past Surgical History:  Procedure Laterality Date  . ABDOMINAL HYSTERECTOMY  1996  . APPENDECTOMY  1997  . ESOPHAGOGASTRODUODENOSCOPY N/A 11/12/2017   Procedure: ESOPHAGOGASTRODUODENOSCOPY (EGD);  Surgeon: Lin Landsman, MD;  Location[facility] ENDOSCOPY;  Service: Gastroenterology;  Laterality: N/A;  . ESOPHAGOGASTRODUODENOSCOPY (EGD) WITH PROPOFOL N/A 04/24/2016   Procedure: ESOPHAGOGASTRODUODENOSCOPY (EGD) WITH PROPOFOL;  Surgeon: [name], MD;  Location: [facility] ENDOSCOPY;  Service: Endoscopy;  Laterality: N/A;  . HAND SURGERY  1988  . THYROID SURGERY  2013    Prior to Admission medications   Medication Sig Start Date End Date Taking? Authorizing Provider  amLODipine (NORVASC) 5 MG tablet Take 5 mg by mouth daily.   Yes [provider]  atorvastatin (LIPITOR) 40 MG tablet Take 40 mg by mouth at bedtime.    Yes [provider]  flurazepam (DALMANE) 15 MG capsule Take 1 capsule (15 mg total) by mouth at bedtime as needed for sleep. 01/07/17  Yes Dustin Flock, MD  gabapentin (NEURONTIN) 100 MG capsule Take 100 mg by mouth 2 (two) times daily. 01/27/18  Yes [provider]  HYDROcodone-acetaminophen (NORCO/VICODIN) 5-325 MG tablet Take 1 tablet  by mouth 4 (four) times daily.   Yes [provider]  insulin aspart protamine - aspart (NOVOLOG MIX 70/30 FLEXPEN) (70-30) 100 UNIT/ML FlexPen Inject 15 Units into the skin 2 (two) times daily.   Yes [provider]  ondansetron (ZOFRAN ODT) 4 MG disintegrating tablet Take 1 tablet (4 mg total) by mouth every 8 (eight) hours as needed for nausea or vomiting. 11/11/17  Yes Gregor Hams, MD  clindamycin (CLEOCIN) 300 MG  capsule Take 1 capsule (300 mg total) by mouth 3 (three) times daily. 03/12/18   Delman Kitten, MD  cyclobenzaprine (FLEXERIL) 5 MG tablet Take 1 tablet (5 mg total) by mouth 2 (two) times daily. Patient not taking: Reported on 03/12/2018 01/07/17   Dustin Flock, MD  doxycycline (VIBRAMYCIN) 100 MG capsule Take 1 capsule (100 mg total) by mouth 2 (two) times daily. 03/12/18   Delman Kitten, MD  lipase/protease/amylase (CREON) 36000 UNITS CPEP capsule Take 2 capsules (72,000 Units total) by mouth 3 (three) times daily with meals. 03/12/18   Delman Kitten, MD  lipase/protease/amylase (CREON) 36000 UNITS CPEP capsule Take 1 capsule (36,000 Units total) by mouth 2 (two) times daily with a meal. Take this with snacks up to twice a day. 03/12/18   Delman Kitten, MD  loperamide (IMODIUM) 2 MG capsule Take 1 capsule (2 mg total) by mouth every 6 (six) hours as needed for diarrhea or loose stools. 11/13/17   Fritzi Mandes, MD  omeprazole (PRILOSEC) 40 MG capsule Take 1 capsule (40 mg total) by mouth 2 (two) times daily for 14 days. 11/19/17 12/03/17  Lin Landsman, MD  GLUCERNA (GLUCERNA) LIQD Take 237 mLs by mouth 3 (three) times daily between meals. 02/19/15 02/27/15  Bettey Costa, MD    Allergies Penicillins; Reglan [metoclopramide]; and Percocet [oxycodone-acetaminophen]  Family History  Family history unknown: Yes    Social History Social History   Tobacco Use  . Smoking status: Former Research scientist (life sciences)  . Smokeless tobacco: Never Used  Substance Use Topics  . Alcohol use: Not Currently    Comment: 40oz/day  . Drug use: No    Review of Systems Constitutional: No fever/chills Eyes: No visual changes. ENT: No sore throat. Cardiovascular: Denies chest pain. Respiratory: Denies shortness of breath. Gastrointestinal: See HPI Genitourinary: Negative for dysuria. Musculoskeletal: Negative for back pain.  Some swelling in both ankles. Skin: Negative for rash except she reports that 3 days ago her cat got entangled in  the blinds at her house and she had to remove the cat from the blinds and while doing so it got very excited and it bit her on her left forearm.  She is noticed an area of redness and some swelling about the size of the hand in that area.  She reports it feels achy as well she goes on to describe that the cat has been hers for about a year now and she has had it since it was a kitten, it has not been immunized for rabies but she also reports that it is been acting normally and is an indoor cat and she would doubt it has rabies. Neurological: Negative for headaches, areas of focal weakness or numbness.    ____________________________________________   PHYSICAL EXAM:  VITAL SIGNS: ED Triage Vitals  Enc Vitals Group     BP 03/12/18 0843 (!) 158/82     Pulse Rate 03/12/18 0843 74     Resp 03/12/18 0843 16     Temp 03/12/18 0843 97.8 F (36.6 C)  Temp Source 03/12/18 0843 Oral     SpO2 03/12/18 0843 100 %     Weight 03/12/18 0842 114 lb (51.7 kg)     Height 03/12/18 0842 5\' 1"  (1.549 m)     Head Circumference --      Peak Flow --      Pain Score 03/12/18 0842 8     Pain Loc --      Pain Edu? --      Excl. in [place]? --     Constitutional: Alert and oriented. Well appearing and in no acute distress. Eyes: Conjunctivae are normal. Head: Atraumatic. Nose: No congestion/rhinnorhea. Mouth/Throat: Mucous membranes are moist. Neck: No stridor.  Cardiovascular: Normal rate, regular rhythm. Grossly normal heart sounds.  Good peripheral circulation. Respiratory: Normal respiratory effort.  No retractions. Lungs CTAB. Gastrointestinal: The abdomen is somewhat distended.  It is mildly tender to palpation throughout.  There is no umbilical protrusion.  Bowel sounds are present. Musculoskeletal: No lower extremity tenderness nor edema. Neurologic:  Normal speech and language. No gross focal neurologic deficits are appreciated.  Skin:  Skin is warm, dry and intact. No rash noted. Psychiatric:  Mood and affect are normal. Speech and behavior are normal.  ____________________________________________   LABS (all labs ordered are listed, but only abnormal results are displayed)  Labs Reviewed  LIPASE, BLOOD - Abnormal; Notable for the following components:      Result Value   Lipase 123 (*)    All other components within normal limits  COMPREHENSIVE METABOLIC PANEL - Abnormal; Notable for the following components:   Glucose, Bld 215 (*)    BUN 33 (*)    Creatinine, Ser 1.41 (*)    Calcium 8.8 (*)    AST 46 (*)    ALT 50 (*)    Alkaline Phosphatase 285 (*)    GFR calc non Af Amer 40 (*)    GFR calc Af Amer 46 (*)    All other components within normal limits  CBC - Abnormal; Notable for the following components:   RBC 3.61 (*)    Hemoglobin 10.4 (*)    HCT 31.9 (*)    All other components within normal limits  URINALYSIS, COMPLETE (UACMP) WITH MICROSCOPIC - Abnormal; Notable for the following components:   Color, Urine YELLOW (*)    APPearance CLEAR (*)    Glucose, UA 150 (*)    Protein, ur 30 (*)    Bacteria, UA RARE (*)    All other components within normal limits  GLUCOSE, CAPILLARY - Abnormal; Notable for the following components:   Glucose-Capillary 51 (*)    All other components within normal limits  GLUCOSE, CAPILLARY - Abnormal; Notable for the following components:   Glucose-Capillary 227 (*)    All other components within normal limits  CULTURE, BLOOD (ROUTINE X 2)  CULTURE, BLOOD (ROUTINE X 2)  BRAIN NATRIURETIC PEPTIDE  GLUCOSE, CAPILLARY  CBG MONITORING, ED   ____________________________________________  EKG   ____________________________________________  RADIOLOGY  Ct Abdomen Pelvis W Contrast  Result Date: 03/12/2018 CLINICAL DATA:  Abdominal distension.  Alcohol abuse. EXAM: CT ABDOMEN AND PELVIS WITH CONTRAST TECHNIQUE: Multidetector CT imaging of the abdomen and pelvis was performed using the standard protocol following bolus  administration of intravenous contrast. CONTRAST:  27mL OMNIPAQUE IOHEXOL 300 MG/ML  SOLN COMPARISON:  01/29/2017 FINDINGS: Lower chest: Clear lung bases. Normal heart size without pericardial or pleural effusion. Hepatobiliary: High right hepatic lobe hypoattenuating lesion of 9 mm is present on the  prior exam, but may be new compared to 10/15/2016. Subtle irregular hepatic capsule suspected, including on image 25/2. Normal gallbladder. The common duct is mildly dilated for age, including at 10 mm on image 30/2. No obstructive stone or mass identified. Pancreas: Pancreas divisum, with a prominent dorsal duct entering the duodenum on image 33/2. No acute inflammation. Spleen: Normal in size, without focal abnormality. Adrenals/Urinary Tract: Normal right adrenal gland. Minimal left adrenal nodularity. probable punctate bilateral renal collecting system calculi. Normal urinary bladder. Stomach/Bowel: Pyloric underdistention on image 28/2. This is less impressive on kidney delayed images. Colonic stool burden suggests constipation. Normal terminal ileum. Normal small bowel. Vascular/Lymphatic: Advanced aortic and branch vessel atherosclerosis. Patent portal and splenic veins. No abdominopelvic adenopathy. Reproductive: Hysterectomy.  No adnexal mass. Other: No significant free fluid. Musculoskeletal: Mild disc bulge at L5-S1 and L2-3. IMPRESSION: 1. Possible constipation. No other explanation for patient's symptoms. 2. New common duct dilatation, without cause identified. Correlate with bilirubin levels. If these are elevated, consider ERCP or MRCP. 3. Subtle irregular hepatic capsule suspected. Cannot exclude mild cirrhosis in this patient with a history of alcohol abuse. Correlate with risk factors. 4. Hypoattenuating right hepatic lobe lesion is similar to 01/29/2017 but absent on 10/15/2016. Especially if there is a clinical concern of cirrhosis, consider pre and post contrast abdominal MRI follow-up at 6 months.  5.  Aortic Atherosclerosis (ICD10-I70.0).  This is age advanced. 6. Pancreas divisum. 7. Probable nephrolithiasis bilaterally. Electronically Signed   By: Abigail Miyamoto M.D.   On: 03/12/2018 11:59    CT scan reviewed, possible constipation.  Common duct dilatation is noted.  Also possibly some early findings of cirrhosis.  No ascites.  No obstruction. ____________________________________________   PROCEDURES  Procedure(s) performed: None  Procedures  Critical Care performed: No  ____________________________________________   INITIAL IMPRESSION / ASSESSMENT AND PLAN / ED COURSE  Pertinent labs & imaging results that were available during my care of the patient were reviewed by me and considered in my medical decision making (see chart for details).   Differential diagnosis includes but is not limited to, abdominal perforation, aortic dissection, cholecystitis, appendicitis, diverticulitis, colitis, esophagitis/gastritis, kidney stone, pyelonephritis, urinary tract infection, aortic aneurysm. All are considered in decision and treatment plan. Based upon the patient's presentation and risk factors, I suspect the etiology may be pancreatitis or development of possible ascites, also consider other etiologies such as obstruction etc.  With regard to her left arm there is an area of redness that is consistent with cellulitis likely associated with a cat bite.  Will initiate doxycycline and clindamycin.  She does not demonstrate any evidence of sepsis.  Her examination shows this to be localized primarily to the left forearm and a little bit over the left posterior elbow region at this time.   Clinical Course as of Mar 11 1657  Sat Mar 12, 2018  1223 Discussed evaluation and CT with Dr. Enriqueta Shutter. Of note, [facility] did a recent MRCP and patient had worse LFTs at [facility] in January. I'll discharge patient as it appears there is no clear indication of an acute new concern. Dr. Enriqueta Shutter advises patient will  need follow-up with [facility].    [MQ]    Clinical Course User Index [MQ] Delman Kitten, MD   Discussed rabies prophylaxis with the patient.  Also reviewed up-to-date recommendations, I think overall this is relatively low risk for rabies as it is her cat, house cat, acting normally and patient does not wish for rabies prophylaxis at  this time despite discussing benefits and risks.  We will report to animal control, patient gave me her number and will defer this to animal control for further evaluation and possible quarantine of the animal.  Patient is agreeable with [place] contacting animal control.  Discussed case with Dr. Beverly Gust of GI.  Reviewed records from [facility].  She recommends placing the patient on Creon and follow-up with [facility].  I discussed return precautions and follow-up with [facility] with the patient as well as her new prescriptions including Creon.  I also discussed with her careful monitoring of her cellulitis related to a cat bite.  She reports that an officer has already contacted her and that they will be following up with her to evaluate her bite and animal Monday or Tuesday.   ____________________________________________   FINAL CLINICAL IMPRESSION(S) / ED DIAGNOSES  Final diagnoses:  Cat bite, initial encounter  Alcohol-induced chronic pancreatitis [facility])        Note:  This document was prepared using Dragon voice recognition software and may include unintentional dictation errors       Delman Kitten, MD 03/12/18 1700

## 2018-03-12 NOTE — ED Triage Notes (Signed)
Pt states "my stomach has been blowing up for over a week." states BM "good" but states "stomach blows up right tight." nausea but no vomiting.   Also c/o that cat has scratched her L arm.  A&O.

## 2018-03-12 NOTE — ED Provider Notes (Signed)
Patient best phone # [phone].   Delman Kitten, MD 03/12/18 [phone]

## 2018-03-12 NOTE — ED Notes (Signed)
Patient transported to CT 

## 2018-03-12 NOTE — ED Notes (Signed)
This RN informed [name] PD about pt's cat bite that occurred at her home. [place] PD will come to speak with the pt.

## 2018-03-17 LAB — CULTURE, BLOOD (ROUTINE X 2)
Culture: NO GROWTH
Culture: NO GROWTH
Special Requests: ADEQUATE
Special Requests: ADEQUATE

## 2018-08-11 ENCOUNTER — Ambulatory Visit
Admission: RE | Admit: 2018-08-11 | Discharge: 2018-08-11 | Disposition: A | Discharge status: Home / Self Care | Payer: Medicaid Other | Attending: Family Medicine | Admitting: Family Medicine

## 2018-08-11 ENCOUNTER — Ambulatory Visit
Admission: RE | Admit: 2018-08-11 | Discharge: 2018-08-11 | Disposition: A | Discharge status: Home / Self Care | Payer: Medicaid Other | Source: Ambulatory Visit | Attending: Family Medicine | Admitting: Family Medicine

## 2018-08-11 ENCOUNTER — Other Ambulatory Visit: Payer: Self-pay | Admitting: Addiction Medicine

## 2018-08-11 ENCOUNTER — Other Ambulatory Visit: Payer: Self-pay

## 2018-08-11 DIAGNOSIS — M25612 Stiffness of left shoulder, not elsewhere classified: Secondary | ICD-10-CM | POA: Insufficient documentation

## 2018-08-11 DIAGNOSIS — M25512 Pain in left shoulder: Secondary | ICD-10-CM

## 2018-09-22 ENCOUNTER — Emergency Department: Payer: Medicaid Other

## 2018-09-22 ENCOUNTER — Other Ambulatory Visit: Payer: Self-pay

## 2018-09-22 ENCOUNTER — Encounter: Payer: Self-pay | Admitting: Emergency Medicine

## 2018-09-22 ENCOUNTER — Emergency Department
Admission: EM | Admit: 2018-09-22 | Discharge: 2018-09-22 | Disposition: A | Discharge status: Home / Self Care | Payer: Medicaid Other | Attending: Student in an Organized Health Care Education/Training Program | Admitting: Student in an Organized Health Care Education/Training Program

## 2018-09-22 DIAGNOSIS — Z79899 Other long term (current) drug therapy: Secondary | ICD-10-CM | POA: Insufficient documentation

## 2018-09-22 DIAGNOSIS — E119 Type 2 diabetes mellitus without complications: Secondary | ICD-10-CM | POA: Diagnosis not present

## 2018-09-22 DIAGNOSIS — R1013 Epigastric pain: Secondary | ICD-10-CM | POA: Diagnosis present

## 2018-09-22 DIAGNOSIS — Z794 Long term (current) use of insulin: Secondary | ICD-10-CM | POA: Insufficient documentation

## 2018-09-22 DIAGNOSIS — I10 Essential (primary) hypertension: Secondary | ICD-10-CM | POA: Insufficient documentation

## 2018-09-22 DIAGNOSIS — Z87891 Personal history of nicotine dependence: Secondary | ICD-10-CM | POA: Diagnosis not present

## 2018-09-22 DIAGNOSIS — J45909 Unspecified asthma, uncomplicated: Secondary | ICD-10-CM | POA: Insufficient documentation

## 2018-09-22 LAB — LIPASE, BLOOD: Lipase: 102 U/L — ABNORMAL HIGH (ref 11–51)

## 2018-09-22 LAB — CBC WITH DIFFERENTIAL/PLATELET
Abs Immature Granulocytes: 0.08 10*3/uL — ABNORMAL HIGH (ref 0.00–0.07)
Basophils Absolute: 0.1 10*3/uL (ref 0.0–0.1)
Basophils Relative: 1 %
Eosinophils Absolute: 0.2 10*3/uL (ref 0.0–0.5)
Eosinophils Relative: 2 %
HCT: 36.2 % (ref 36.0–46.0)
Hemoglobin: 12.3 g/dL (ref 12.0–15.0)
Immature Granulocytes: 1 %
Lymphocytes Relative: 27 %
Lymphs Abs: 2.4 10*3/uL (ref 0.7–4.0)
MCH: 28.3 pg (ref 26.0–34.0)
MCHC: 34 g/dL (ref 30.0–36.0)
MCV: 83.2 fL (ref 80.0–100.0)
Monocytes Absolute: 0.6 10*3/uL (ref 0.1–1.0)
Monocytes Relative: 7 %
Neutro Abs: 5.5 10*3/uL (ref 1.7–7.7)
Neutrophils Relative %: 62 %
Platelets: 246 10*3/uL (ref 150–400)
RBC: 4.35 MIL/uL (ref 3.87–5.11)
RDW: 12.3 % (ref 11.5–15.5)
WBC: 8.8 10*3/uL (ref 4.0–10.5)
nRBC: 0 % (ref 0.0–0.2)

## 2018-09-22 LAB — URINALYSIS, COMPLETE (UACMP) WITH MICROSCOPIC
Bilirubin Urine: NEGATIVE
Glucose, UA: >=500 mg/dL — AB
Hgb urine dipstick: NEGATIVE
Ketones, ur: NEGATIVE mg/dL
Nitrite: NEGATIVE
Protein, ur: 30 mg/dL — AB
Specific Gravity, Urine: 1.009 (ref 1.005–1.030)
pH: 6 (ref 5.0–8.0)

## 2018-09-22 LAB — COMPREHENSIVE METABOLIC PANEL
ALT: 36 U/L (ref 0–44)
AST: 29 U/L (ref 15–41)
Albumin: 3.9 g/dL (ref 3.5–5.0)
Alkaline Phosphatase: 217 U/L — ABNORMAL HIGH (ref 38–126)
Anion gap: 10 (ref 5–15)
BUN: 33 mg/dL — ABNORMAL HIGH (ref 8–23)
CO2: 25 mmol/L (ref 22–32)
Calcium: 9.3 mg/dL (ref 8.9–10.3)
Chloride: 104 mmol/L (ref 98–111)
Creatinine, Ser: 1.23 mg/dL — ABNORMAL HIGH (ref 0.44–1.00)
GFR calc Af Amer: 55 mL/min — ABNORMAL LOW (ref >60)
GFR calc non Af Amer: 47 mL/min — ABNORMAL LOW (ref >60)
Glucose, Bld: 340 mg/dL — ABNORMAL HIGH (ref 70–99)
Potassium: 4.5 mmol/L (ref 3.5–5.1)
Sodium: 139 mmol/L (ref 135–145)
Total Bilirubin: 0.7 mg/dL (ref 0.3–1.2)
Total Protein: 7.8 g/dL (ref 6.5–8.1)

## 2018-09-22 LAB — GLUCOSE, CAPILLARY: Glucose-Capillary: 132 mg/dL — ABNORMAL HIGH (ref 70–99)

## 2018-09-22 MED ORDER — INSULIN ASPART 100 UNIT/ML ~~LOC~~ SOLN
6.0000 [IU] | Freq: Once | SUBCUTANEOUS | Status: AC
  Administered 2018-09-22: 6 [IU] via INTRAVENOUS
  Filled 2018-09-22: qty 1

## 2018-09-22 MED ORDER — ATORVASTATIN CALCIUM 40 MG PO TABS: 40.0000 mg | ORAL_TABLET | Freq: Every day | ORAL | 0 refills | Status: DC

## 2018-09-22 MED ORDER — OMEPRAZOLE 40 MG PO CPDR: 40.0000 mg | DELAYED_RELEASE_CAPSULE | Freq: Two times a day (BID) | ORAL | 0 refills | Status: DC

## 2018-09-22 MED ORDER — PANCRELIPASE (LIP-PROT-AMYL) 36000-114000 UNITS PO CPEP: 36000.0000 [IU] | ORAL_CAPSULE | Freq: Two times a day (BID) | ORAL | 0 refills | Status: DC

## 2018-09-22 MED ORDER — ONDANSETRON HCL 4 MG/2ML IJ SOLN
4.0000 mg | Freq: Once | INTRAMUSCULAR | Status: AC
  Administered 2018-09-22: 4 mg via INTRAVENOUS
  Filled 2018-09-22: qty 2

## 2018-09-22 MED ORDER — AMLODIPINE BESYLATE 5 MG PO TABS: 5.0000 mg | ORAL_TABLET | Freq: Every day | ORAL | 0 refills | Status: DC

## 2018-09-22 MED ORDER — NOVOLOG MIX 70/30 FLEXPEN (70-30) 100 UNIT/ML ~~LOC~~ SUPN: 15.0000 [IU] | PEN_INJECTOR | Freq: Two times a day (BID) | SUBCUTANEOUS | 0 refills | Status: DC

## 2018-09-22 MED ORDER — ONDANSETRON 4 MG PO TBDP: 4.0000 mg | ORAL_TABLET | Freq: Three times a day (TID) | ORAL | 0 refills | Status: DC | PRN

## 2018-09-22 MED ORDER — HYDROCODONE-ACETAMINOPHEN 5-325 MG PO TABS: 1.0000 | ORAL_TABLET | ORAL | 0 refills | Status: DC | PRN

## 2018-09-22 MED ORDER — SODIUM CHLORIDE 0.9 % IV BOLUS
500.0000 mL | Freq: Once | INTRAVENOUS | Status: AC
  Administered 2018-09-22: 500 mL via INTRAVENOUS

## 2018-09-22 MED ORDER — MORPHINE SULFATE (PF) 4 MG/ML IV SOLN
4.0000 mg | INTRAVENOUS | Status: DC | PRN
  Administered 2018-09-22: 4 mg via INTRAVENOUS
  Filled 2018-09-22: qty 1

## 2018-09-22 MED ORDER — INSULIN ASPART 100 UNIT/ML ~~LOC~~ SOLN: 8.0000 [IU] | Freq: Once | SUBCUTANEOUS | Status: DC

## 2018-09-22 MED ORDER — SUCRALFATE 1 G PO TABS
1.0000 g | ORAL_TABLET | Freq: Once | ORAL | Status: AC
  Administered 2018-09-22: 1 g via ORAL
  Filled 2018-09-22: qty 1

## 2018-09-22 MED ORDER — IOHEXOL 300 MG/ML  SOLN
75.0000 mL | Freq: Once | INTRAMUSCULAR | Status: AC | PRN
  Administered 2018-09-22: 75 mL via INTRAVENOUS

## 2018-09-22 NOTE — ED Notes (Addendum)
Given 16 oz of water to attempt to drink. Pt currently denies nausea. Visitor given warm blanket.

## 2018-09-22 NOTE — ED Triage Notes (Signed)
Patient ambulatory to triage with steady gait, without difficulty or distress noted; pt reports upper abd pain since yesterday accomp by N/V; st hx pancreatitis, no alcohol in last year

## 2018-09-22 NOTE — ED Notes (Signed)
Report to Susan, RN  

## 2018-09-22 NOTE — ED Provider Notes (Signed)
[facility] Emergency Department Provider Note    First MD Initiated Contact with Patient 09/22/18 [phone]     (approximate)  I have reviewed the triage vital signs and the nursing notes.   HISTORY  Chief Complaint Abdominal Pain    HPI Karen [name] is a 63 y.o. female [place] past medical history presents to the ER for evaluation of 24 hours of epigastric discomfort nausea and vomiting.  States she had chills at home but no measured fevers.  Denies any recent alcohol use.  Feels like this is consistent with her pancreatitis.  States the pain is mild to moderate.  Denies any chest pain or shortness of breath.   Past Medical History:  Diagnosis Date  . Alcoholic pancreatitis   . Asthma   . Depression   . Diabetes mellitus without complication ([place])   . DKA (diabetic ketoacidoses) ([place]) 02/17/2015  . Emphysematous cystitis   . Hypercholesteremia   . Hypertension   . Hypokalemia    Family History  Family history unknown: Yes   Past Surgical History:  Procedure Laterality Date  . ABDOMINAL HYSTERECTOMY  1996  . APPENDECTOMY  1997  . ESOPHAGOGASTRODUODENOSCOPY N/A 11/12/2017   Procedure: ESOPHAGOGASTRODUODENOSCOPY (EGD);  Surgeon: Lin Landsman, MD;  Location: [facility] ENDOSCOPY;  Service: Gastroenterology;  Laterality: N/A;  . ESOPHAGOGASTRODUODENOSCOPY (EGD) WITH PROPOFOL N/A 04/24/2016   Procedure: ESOPHAGOGASTRODUODENOSCOPY (EGD) WITH PROPOFOL;  Surgeon: [name], MD;  Location: [facility] ENDOSCOPY;  Service: Endoscopy;  Laterality: N/A;  . HAND SURGERY  1988  . THYROID SURGERY  2013   Patient Active Problem List   Diagnosis Date Noted  . 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 Coffee ground emesis   . Upper GI bleed 11/11/2017  . Pancreatitis, acute 01/03/2017  . ETOH abuse 01/03/2017  . UTI (urinary tract infection) 01/03/2017  . Nausea and vomiting   . Esophageal candidiasis ([place])   . Intractable vomiting with nausea   . Hypertensive  urgency 04/21/2016  . Abdominal pain   . Acute renal failure ([place])   . Nausea vomiting and diarrhea   . Smoker   . Poorly controlled type 2 diabetes mellitus ([place])   . Acute renal insufficiency 10/29/2015  . Elevated troponin 10/29/2015  . Urinary tract infection without hematuria 10/29/2015  . Left flank pain 10/29/2015  . Malignant essential hypertension 10/29/2015  . DKA (diabetic ketoacidoses) ([place]) 02/17/2015  . Emphysematous cystitis       Prior to Admission medications   Medication Sig Start Date End Date Taking? Authorizing Provider  amLODipine (NORVASC) 5 MG tablet Take 1 tablet (5 mg total) by mouth daily. 09/22/18   Merlyn Lot, MD  atorvastatin (LIPITOR) 40 MG tablet Take 1 tablet (40 mg total) by mouth at bedtime. 09/22/18   Merlyn Lot, MD  clindamycin (CLEOCIN) 300 MG capsule Take 1 capsule (300 mg total) by mouth 3 (three) times daily. 03/12/18   Delman Kitten, MD  cyclobenzaprine (FLEXERIL) 5 MG tablet Take 1 tablet (5 mg total) by mouth 2 (two) times daily. Patient not taking: Reported on 03/12/2018 01/07/17   Dustin Flock, MD  doxycycline (VIBRAMYCIN) 100 MG capsule Take 1 capsule (100 mg total) by mouth 2 (two) times daily. 03/12/18   Delman Kitten, MD  flurazepam (DALMANE) 15 MG capsule Take 1 capsule (15 mg total) by mouth at bedtime as needed for sleep. 01/07/17   Dustin Flock, MD  gabapentin (NEURONTIN) 100 MG capsule Take 100 mg by mouth 2 (two) times daily. 01/27/18  [provider]  HYDROcodone-acetaminophen (NORCO) 5-325 MG tablet Take 1 tablet by mouth every 4 (four) hours as needed for moderate pain. 09/22/18   Merlyn Lot, MD  HYDROcodone-acetaminophen (NORCO/VICODIN) 5-325 MG tablet Take 1 tablet by mouth 4 (four) times daily.    [provider]  insulin aspart protamine - aspart (NOVOLOG MIX 70/30 FLEXPEN) (70-30) 100 UNIT/ML FlexPen Inject 0.15 mLs (15 Units total) into the skin 2 (two) times daily. 09/22/18   Merlyn Lot,  MD  lipase/protease/amylase (CREON) 36000 UNITS CPEP capsule Take 2 capsules (72,000 Units total) by mouth 3 (three) times daily with meals. 03/12/18   Delman Kitten, MD  lipase/protease/amylase (CREON) 36000 UNITS CPEP capsule Take 1 capsule (36,000 Units total) by mouth 2 (two) times daily with a meal. Take this with snacks up to twice a day. 09/22/18   Merlyn Lot, MD  loperamide (IMODIUM) 2 MG capsule Take 1 capsule (2 mg total) by mouth every 6 (six) hours as needed for diarrhea or loose stools. 11/13/17   Fritzi Mandes, MD  omeprazole (PRILOSEC) 40 MG capsule Take 1 capsule (40 mg total) by mouth 2 (two) times daily for 14 days. 09/22/18 10/06/18  Merlyn Lot, MD  ondansetron (ZOFRAN ODT) 4 MG disintegrating tablet Take 1 tablet (4 mg total) by mouth every 8 (eight) hours as needed for nausea or vomiting. 09/22/18   Merlyn Lot, MD  GLUCERNA [facility]) LIQD Take 237 mLs by mouth 3 (three) times daily between meals. 02/19/15 02/27/15  Bettey Costa, MD    Allergies Penicillins, Reglan [metoclopramide], and Percocet [oxycodone-acetaminophen]    Social History Social History   Tobacco Use  . Smoking status: Former Research scientist (life sciences)  . Smokeless tobacco: Never Used  Substance Use Topics  . Alcohol use: Not Currently    Comment: 40oz/day  . Drug use: No    Review of Systems Patient denies headaches, rhinorrhea, blurry vision, numbness, shortness of breath, chest pain, edema, cough, abdominal pain, nausea, vomiting, diarrhea, dysuria, fevers, rashes or hallucinations unless otherwise stated above in HPI. ____________________________________________   PHYSICAL EXAM:  VITAL SIGNS: Vitals:   09/22/18 0930 09/22/18 1030  BP: (!) 154/76 (!) 144/77  Pulse: 88 90  Resp:  16  Temp:    SpO2: 98% 98%    Constitutional: Alert and oriented.  Eyes: Conjunctivae are normal.  Head: Atraumatic. Nose: No congestion/rhinnorhea. Mouth/Throat: Mucous membranes are moist.   Neck: No stridor.  Painless ROM.  Cardiovascular: Normal rate, regular rhythm. Grossly normal heart sounds.  Good peripheral circulation. Respiratory: Normal respiratory effort.  No retractions. Lungs CTAB. Gastrointestinal: Soft and nontender. No distention. No abdominal bruits. No CVA tenderness. Genitourinary:  Musculoskeletal: No lower extremity tenderness nor edema.  No joint effusions. Neurologic:  Normal speech and language. No gross focal neurologic deficits are appreciated. No facial droop Skin:  Skin is warm, dry and intact. No rash noted. Psychiatric: Mood and affect are normal. Speech and behavior are normal.  ____________________________________________   LABS (all labs ordered are listed, but only abnormal results are displayed)  Results for orders placed or performed during the hospital encounter of 09/22/18 (from the past 24 hour(s))  CBC with Differential/Platelet     Status: Abnormal   Collection Time: 09/22/18  7:02 AM  Result Value Ref Range   WBC 8.8 4.0 - 10.5 K/uL   RBC 4.35 3.87 - 5.11 MIL/uL   Hemoglobin 12.3 12.0 - 15.0 g/dL   HCT 36.2 36.0 - 46.0 %   MCV 83.2 80.0 - 100.0 fL  MCH 28.3 26.0 - 34.0 pg   MCHC 34.0 30.0 - 36.0 g/dL   RDW 12.3 11.5 - 15.5 %   Platelets 246 150 - 400 K/uL   nRBC 0.0 0.0 - 0.2 %   Neutrophils Relative % 62 %   Neutro Abs 5.5 1.7 - 7.7 K/uL   Lymphocytes Relative 27 %   Lymphs Abs 2.4 0.7 - 4.0 K/uL   Monocytes Relative 7 %   Monocytes Absolute 0.6 0.1 - 1.0 K/uL   Eosinophils Relative 2 %   Eosinophils Absolute 0.2 0.0 - 0.5 K/uL   Basophils Relative 1 %   Basophils Absolute 0.1 0.0 - 0.1 K/uL   Immature Granulocytes 1 %   Abs Immature Granulocytes 0.08 (H) 0.00 - 0.07 K/uL  Comprehensive metabolic panel     Status: Abnormal   Collection Time: 09/22/18  7:02 AM  Result Value Ref Range   Sodium 139 135 - 145 mmol/L   Potassium 4.5 3.5 - 5.1 mmol/L   Chloride 104 98 - 111 mmol/L   CO2 25 22 - 32 mmol/L   Glucose, Bld 340 (H) 70 - 99  mg/dL   BUN 33 (H) 8 - 23 mg/dL   Creatinine, Ser 1.23 (H) 0.44 - 1.00 mg/dL   Calcium 9.3 8.9 - 10.3 mg/dL   Total Protein 7.8 6.5 - 8.1 g/dL   Albumin 3.9 3.5 - 5.0 g/dL   AST 29 15 - 41 U/L   ALT 36 0 - 44 U/L   Alkaline Phosphatase 217 (H) 38 - 126 U/L   Total Bilirubin 0.7 0.3 - 1.2 mg/dL   GFR calc non Af Amer 47 (L) >60 mL/min   GFR calc Af Amer 55 (L) >60 mL/min   Anion gap 10 5 - 15  Lipase, blood     Status: Abnormal   Collection Time: 09/22/18  7:02 AM  Result Value Ref Range   Lipase 102 (H) 11 - 51 U/L  Urinalysis, Complete w Microscopic     Status: Abnormal   Collection Time: 09/22/18  7:02 AM  Result Value Ref Range   Color, Urine STRAW (A) YELLOW   APPearance CLEAR (A) CLEAR   Specific Gravity, Urine 1.009 1.005 - 1.030   pH 6.0 5.0 - 8.0   Glucose, UA >=500 (A) NEGATIVE mg/dL   Hgb urine dipstick NEGATIVE NEGATIVE   Bilirubin Urine NEGATIVE NEGATIVE   Ketones, ur NEGATIVE NEGATIVE mg/dL   Protein, ur 30 (A) NEGATIVE mg/dL   Nitrite NEGATIVE NEGATIVE   Leukocytes,Ua TRACE (A) NEGATIVE   RBC / HPF 0-5 0 - 5 RBC/hpf   WBC, UA 6-10 0 - 5 WBC/hpf   Bacteria, UA RARE (A) NONE SEEN   Squamous Epithelial / LPF 0-5 0 - 5   Mucus PRESENT   Glucose, capillary     Status: Abnormal   Collection Time: 09/22/18 10:35 AM  Result Value Ref Range   Glucose-Capillary 132 (H) 70 - 99 mg/dL   ____________________________________________  EKG My review and personal interpretation at Time: 6:53   Indication: epgiastric pain  Rate: 85  Rhythm: sinus Axis: normal  Other: normal intervals, no stemi ____________________________________________  RADIOLOGY  I personally reviewed all radiographic images ordered to evaluate for the above acute complaints and reviewed radiology reports and findings.  These findings were personally discussed with the patient.  Please see medical record for radiology report.  ____________________________________________   PROCEDURES   Procedure(s) performed:  Procedures    Critical Care performed: no ____________________________________________  INITIAL IMPRESSION / ASSESSMENT AND PLAN / ED COURSE  Pertinent labs & imaging results that were available during my care of the patient were reviewed by me and considered in my medical decision making (see chart for details).   DDX: Otitis, enteritis, gastritis, colitis, diverticulitis, stone UTI  AVIAN OHMER is a 63 y.o. who presents to the ED with symptoms as described above.  Patient with extensive past medical history and known chronic pancreatitis.  Symptoms likely consistent with this.  CT imaging will be ordered for the blood differential.  Clinical Course as of Sep 22 1131  Thu Sep 22, 2018  1103 CT imaging is reassuring.  Patient is tolerating oral hydration.  There is no signs of DKA.  Abdominal exam is benign.  Patient states that she needs more medications because she "lost her purse" with her meds and them.  She denies any chest pain or shortness of breath.  Lipase is roughly at baseline.  Have a lower suspicion for acute pancreatitis more consistent with a chronic pancreatitis.  Will give short prescription for pain medication and refill her chronic medications until she can get back into her PCP..  I do not see any indication for hospitalization as she is tolerating p.o.   [PR]    Clinical Course User Index [PR] Merlyn Lot, MD    The patient was evaluated in Emergency Department today for the symptoms described in the history of present illness. He/she was evaluated in the context of the global COVID-19 pandemic, which necessitated consideration that the patient might be at risk for infection with the SARS-CoV-2 virus that causes COVID-19. Institutional protocols and algorithms that pertain to the evaluation of patients at risk for COVID-19 are in a state of rapid change based on information released by regulatory bodies including the CDC and federal and  state organizations. These policies and algorithms were followed during the patient's care in the ED.  As part of my medical decision making, I reviewed the following data within the [place] notes reviewed and incorporated, Labs reviewed, notes from prior ED visits and [place] Controlled Substance Database   ____________________________________________   FINAL CLINICAL IMPRESSION(S) / ED DIAGNOSES  Final diagnoses:  Epigastric pain      NEW MEDICATIONS STARTED DURING THIS VISIT:  New Prescriptions   HYDROCODONE-ACETAMINOPHEN (NORCO) 5-325 MG TABLET    Take 1 tablet by mouth every 4 (four) hours as needed for moderate pain.     Note:  This document was prepared using Dragon voice recognition software and may include unintentional dictation errors.    Merlyn Lot, MD 09/22/18 1133

## 2018-09-22 NOTE — ED Notes (Addendum)
Pt able to drink small amount of water without emesis. Pt states she just does not feel like she wants to drink.

## 2018-09-25 ENCOUNTER — Encounter: Payer: Self-pay | Admitting: Emergency Medicine

## 2018-09-25 ENCOUNTER — Inpatient Hospital Stay
Admission: EM | Admit: 2018-09-25 | Discharge: 2018-09-29 | DRG: 439 | Disposition: A | Discharge status: Home / Self Care | Payer: Medicaid Other | Attending: Specialist | Admitting: Specialist

## 2018-09-25 ENCOUNTER — Other Ambulatory Visit: Payer: Self-pay

## 2018-09-25 DIAGNOSIS — E785 Hyperlipidemia, unspecified: Secondary | ICD-10-CM | POA: Diagnosis present

## 2018-09-25 DIAGNOSIS — K861 Other chronic pancreatitis: Secondary | ICD-10-CM | POA: Diagnosis present

## 2018-09-25 DIAGNOSIS — K859 Acute pancreatitis without necrosis or infection, unspecified: Principal | ICD-10-CM

## 2018-09-25 DIAGNOSIS — N179 Acute kidney failure, unspecified: Secondary | ICD-10-CM | POA: Diagnosis present

## 2018-09-25 DIAGNOSIS — Z79891 Long term (current) use of opiate analgesic: Secondary | ICD-10-CM

## 2018-09-25 DIAGNOSIS — R140 Abdominal distension (gaseous): Secondary | ICD-10-CM

## 2018-09-25 DIAGNOSIS — I10 Essential (primary) hypertension: Secondary | ICD-10-CM | POA: Diagnosis present

## 2018-09-25 DIAGNOSIS — Z23 Encounter for immunization: Secondary | ICD-10-CM

## 2018-09-25 DIAGNOSIS — Z880 Allergy status to penicillin: Secondary | ICD-10-CM

## 2018-09-25 DIAGNOSIS — K219 Gastro-esophageal reflux disease without esophagitis: Secondary | ICD-10-CM | POA: Diagnosis present

## 2018-09-25 DIAGNOSIS — N898 Other specified noninflammatory disorders of vagina: Secondary | ICD-10-CM | POA: Diagnosis present

## 2018-09-25 DIAGNOSIS — Z794 Long term (current) use of insulin: Secondary | ICD-10-CM

## 2018-09-25 DIAGNOSIS — E7800 Pure hypercholesterolemia, unspecified: Secondary | ICD-10-CM | POA: Diagnosis present

## 2018-09-25 DIAGNOSIS — Z888 Allergy status to other drugs, medicaments and biological substances status: Secondary | ICD-10-CM

## 2018-09-25 DIAGNOSIS — E860 Dehydration: Secondary | ICD-10-CM | POA: Diagnosis present

## 2018-09-25 DIAGNOSIS — Z79899 Other long term (current) drug therapy: Secondary | ICD-10-CM

## 2018-09-25 DIAGNOSIS — J45909 Unspecified asthma, uncomplicated: Secondary | ICD-10-CM | POA: Diagnosis present

## 2018-09-25 DIAGNOSIS — Z885 Allergy status to narcotic agent status: Secondary | ICD-10-CM

## 2018-09-25 DIAGNOSIS — Z90710 Acquired absence of both cervix and uterus: Secondary | ICD-10-CM

## 2018-09-25 DIAGNOSIS — E1165 Type 2 diabetes mellitus with hyperglycemia: Secondary | ICD-10-CM | POA: Diagnosis present

## 2018-09-25 DIAGNOSIS — R945 Abnormal results of liver function studies: Secondary | ICD-10-CM | POA: Diagnosis present

## 2018-09-25 DIAGNOSIS — N390 Urinary tract infection, site not specified: Secondary | ICD-10-CM | POA: Diagnosis present

## 2018-09-25 DIAGNOSIS — Z87891 Personal history of nicotine dependence: Secondary | ICD-10-CM

## 2018-09-25 DIAGNOSIS — Z20828 Contact with and (suspected) exposure to other viral communicable diseases: Secondary | ICD-10-CM | POA: Diagnosis present

## 2018-09-25 DIAGNOSIS — E1110 Type 2 diabetes mellitus with ketoacidosis without coma: Secondary | ICD-10-CM

## 2018-09-25 DIAGNOSIS — E1140 Type 2 diabetes mellitus with diabetic neuropathy, unspecified: Secondary | ICD-10-CM | POA: Diagnosis present

## 2018-09-25 DIAGNOSIS — F1010 Alcohol abuse, uncomplicated: Secondary | ICD-10-CM | POA: Diagnosis present

## 2018-09-25 LAB — URINALYSIS, COMPLETE (UACMP) WITH MICROSCOPIC
Bacteria, UA: NONE SEEN
Bilirubin Urine: NEGATIVE
Glucose, UA: >=500 mg/dL — AB
Hgb urine dipstick: NEGATIVE
Ketones, ur: NEGATIVE mg/dL
Nitrite: NEGATIVE
Protein, ur: 100 mg/dL — AB
Specific Gravity, Urine: 1.009 (ref 1.005–1.030)
pH: 6 (ref 5.0–8.0)

## 2018-09-25 LAB — CBC
HCT: 35.7 % — ABNORMAL LOW (ref 36.0–46.0)
Hemoglobin: 12.1 g/dL (ref 12.0–15.0)
MCH: 28 pg (ref 26.0–34.0)
MCHC: 33.9 g/dL (ref 30.0–36.0)
MCV: 82.6 fL (ref 80.0–100.0)
Platelets: 232 10*3/uL (ref 150–400)
RBC: 4.32 MIL/uL (ref 3.87–5.11)
RDW: 12.2 % (ref 11.5–15.5)
WBC: 9.1 10*3/uL (ref 4.0–10.5)
nRBC: 0 % (ref 0.0–0.2)

## 2018-09-25 LAB — COMPREHENSIVE METABOLIC PANEL
ALT: 114 U/L — ABNORMAL HIGH (ref 0–44)
AST: 155 U/L — ABNORMAL HIGH (ref 15–41)
Albumin: 3.8 g/dL (ref 3.5–5.0)
Alkaline Phosphatase: 303 U/L — ABNORMAL HIGH (ref 38–126)
Anion gap: 9 (ref 5–15)
BUN: 32 mg/dL — ABNORMAL HIGH (ref 8–23)
CO2: 22 mmol/L (ref 22–32)
Calcium: 8.8 mg/dL — ABNORMAL LOW (ref 8.9–10.3)
Chloride: 104 mmol/L (ref 98–111)
Creatinine, Ser: 1.41 mg/dL — ABNORMAL HIGH (ref 0.44–1.00)
GFR calc Af Amer: 46 mL/min — ABNORMAL LOW (ref >60)
GFR calc non Af Amer: 40 mL/min — ABNORMAL LOW (ref >60)
Glucose, Bld: 366 mg/dL — ABNORMAL HIGH (ref 70–99)
Potassium: 4.2 mmol/L (ref 3.5–5.1)
Sodium: 135 mmol/L (ref 135–145)
Total Bilirubin: 0.6 mg/dL (ref 0.3–1.2)
Total Protein: 7.6 g/dL (ref 6.5–8.1)

## 2018-09-25 LAB — GLUCOSE, CAPILLARY
Glucose-Capillary: 180 mg/dL — ABNORMAL HIGH (ref 70–99)
Glucose-Capillary: 61 mg/dL — ABNORMAL LOW (ref 70–99)
Glucose-Capillary: 148 mg/dL — ABNORMAL HIGH (ref 70–99)
Glucose-Capillary: 128 mg/dL — ABNORMAL HIGH (ref 70–99)

## 2018-09-25 LAB — HEMOGLOBIN A1C
Hgb A1c MFr Bld: 10.1 % — ABNORMAL HIGH (ref 4.8–5.6)
Mean Plasma Glucose: 243.17 mg/dL

## 2018-09-25 LAB — LIPASE, BLOOD: Lipase: 105 U/L — ABNORMAL HIGH (ref 11–51)

## 2018-09-25 MED ORDER — SODIUM CHLORIDE 0.9 % IV SOLN
INTRAVENOUS | Status: DC
  Administered 2018-09-25 – 2018-09-26 (×2): via INTRAVENOUS

## 2018-09-25 MED ORDER — AMLODIPINE BESYLATE 5 MG PO TABS
5.0000 mg | ORAL_TABLET | Freq: Every day | ORAL | Status: DC
  Administered 2018-09-26 – 2018-09-29 (×4): 5 mg via ORAL
  Filled 2018-09-25 (×4): qty 1

## 2018-09-25 MED ORDER — SODIUM CHLORIDE 0.9% FLUSH: 3.0000 mL | Freq: Once | INTRAVENOUS | Status: DC

## 2018-09-25 MED ORDER — INSULIN ASPART PROT & ASPART (70-30 MIX) 100 UNIT/ML ~~LOC~~ SUSP: 15.0000 [IU] | Freq: Two times a day (BID) | SUBCUTANEOUS | Status: DC

## 2018-09-25 MED ORDER — ONDANSETRON HCL 4 MG/2ML IJ SOLN
4.0000 mg | Freq: Four times a day (QID) | INTRAMUSCULAR | Status: DC | PRN
  Administered 2018-09-26: 4 mg via INTRAVENOUS

## 2018-09-25 MED ORDER — INSULIN ASPART 100 UNIT/ML ~~LOC~~ SOLN
0.0000 [IU] | Freq: Three times a day (TID) | SUBCUTANEOUS | Status: DC
  Administered 2018-09-25 – 2018-09-26 (×2): 2 [IU] via SUBCUTANEOUS
  Administered 2018-09-26: 9 [IU] via SUBCUTANEOUS
  Administered 2018-09-27: 3 [IU] via SUBCUTANEOUS
  Administered 2018-09-27: 2 [IU] via SUBCUTANEOUS
  Administered 2018-09-28: 3 [IU] via SUBCUTANEOUS
  Administered 2018-09-28: 1 [IU] via SUBCUTANEOUS
  Administered 2018-09-28 – 2018-09-29 (×2): 3 [IU] via SUBCUTANEOUS
  Administered 2018-09-29: 1 [IU] via SUBCUTANEOUS
  Administered 2018-09-29: 2 [IU] via SUBCUTANEOUS
  Filled 2018-09-25 (×11): qty 1

## 2018-09-25 MED ORDER — ALBUTEROL SULFATE (2.5 MG/3ML) 0.083% IN NEBU: 2.5000 mg | INHALATION_SOLUTION | RESPIRATORY_TRACT | Status: DC | PRN

## 2018-09-25 MED ORDER — LOPERAMIDE HCL 2 MG PO CAPS: 2.0000 mg | ORAL_CAPSULE | Freq: Four times a day (QID) | ORAL | Status: DC | PRN

## 2018-09-25 MED ORDER — GABAPENTIN 100 MG PO CAPS
100.0000 mg | ORAL_CAPSULE | Freq: Two times a day (BID) | ORAL | Status: DC
  Administered 2018-09-25 – 2018-09-28 (×7): 100 mg via ORAL
  Filled 2018-09-25 (×7): qty 1

## 2018-09-25 MED ORDER — PNEUMOCOCCAL VAC POLYVALENT 25 MCG/0.5ML IJ INJ
0.5000 mL | INJECTION | INTRAMUSCULAR | Status: AC
  Administered 2018-09-26: 0.5 mL via INTRAMUSCULAR
  Filled 2018-09-25: qty 0.5

## 2018-09-25 MED ORDER — ONDANSETRON HCL 4 MG PO TABS
4.0000 mg | ORAL_TABLET | Freq: Four times a day (QID) | ORAL | Status: DC | PRN
  Administered 2018-09-27 – 2018-09-28 (×2): 4 mg via ORAL
  Filled 2018-09-25 (×2): qty 1

## 2018-09-25 MED ORDER — FLURAZEPAM HCL 15 MG PO CAPS
15.0000 mg | ORAL_CAPSULE | Freq: Every evening | ORAL | Status: DC | PRN
  Administered 2018-09-26: 15 mg via ORAL
  Filled 2018-09-25 (×2): qty 1

## 2018-09-25 MED ORDER — ENOXAPARIN SODIUM 40 MG/0.4ML ~~LOC~~ SOLN
40.0000 mg | SUBCUTANEOUS | Status: DC
  Administered 2018-09-25 – 2018-09-28 (×4): 40 mg via SUBCUTANEOUS
  Filled 2018-09-25 (×4): qty 0.4

## 2018-09-25 MED ORDER — ONDANSETRON HCL 4 MG/2ML IJ SOLN
4.0000 mg | Freq: Once | INTRAMUSCULAR | Status: AC
  Administered 2018-09-25: 4 mg via INTRAVENOUS
  Filled 2018-09-25: qty 2

## 2018-09-25 MED ORDER — FLEET ENEMA 7-19 GM/118ML RE ENEM: 1.0000 | ENEMA | Freq: Once | RECTAL | Status: DC | PRN

## 2018-09-25 MED ORDER — INFLUENZA VAC SPLIT QUAD 0.5 ML IM SUSY
0.5000 mL | PREFILLED_SYRINGE | INTRAMUSCULAR | Status: AC
  Administered 2018-09-26: 0.5 mL via INTRAMUSCULAR
  Filled 2018-09-25: qty 0.5

## 2018-09-25 MED ORDER — KETOROLAC TROMETHAMINE 30 MG/ML IJ SOLN
15.0000 mg | Freq: Four times a day (QID) | INTRAMUSCULAR | Status: DC | PRN
  Administered 2018-09-26: 15 mg via INTRAVENOUS
  Filled 2018-09-25: qty 1

## 2018-09-25 MED ORDER — DEXTROSE 50 % IV SOLN: 25.0000 mL | Freq: Once | INTRAVENOUS | Status: DC

## 2018-09-25 MED ORDER — ATORVASTATIN CALCIUM 20 MG PO TABS
40.0000 mg | ORAL_TABLET | Freq: Every day | ORAL | Status: DC
  Administered 2018-09-25 – 2018-09-28 (×4): 40 mg via ORAL
  Filled 2018-09-25 (×4): qty 2

## 2018-09-25 MED ORDER — BISACODYL 5 MG PO TBEC: 5.0000 mg | DELAYED_RELEASE_TABLET | Freq: Every day | ORAL | Status: DC | PRN

## 2018-09-25 MED ORDER — PANCRELIPASE (LIP-PROT-AMYL) 12000-38000 UNITS PO CPEP
36000.0000 [IU] | ORAL_CAPSULE | Freq: Two times a day (BID) | ORAL | Status: DC
  Administered 2018-09-28 – 2018-09-29 (×2): 36000 [IU] via ORAL
  Filled 2018-09-25 (×9): qty 3

## 2018-09-25 MED ORDER — INSULIN ASPART PROT & ASPART (70-30 MIX) 100 UNIT/ML PEN: 15.0000 [IU] | PEN_INJECTOR | Freq: Two times a day (BID) | SUBCUTANEOUS | Status: DC

## 2018-09-25 MED ORDER — DEXTROSE 5 % IV SOLN
INTRAVENOUS | Status: DC
  Administered 2018-09-25: 21:00:00 via INTRAVENOUS

## 2018-09-25 MED ORDER — CYCLOBENZAPRINE HCL 10 MG PO TABS
5.0000 mg | ORAL_TABLET | Freq: Two times a day (BID) | ORAL | Status: DC | PRN
  Administered 2018-09-25 – 2018-09-26 (×2): 5 mg via ORAL
  Filled 2018-09-25 (×2): qty 1

## 2018-09-25 MED ORDER — HYDROCODONE-ACETAMINOPHEN 5-325 MG PO TABS
1.0000 | ORAL_TABLET | Freq: Four times a day (QID) | ORAL | Status: DC
  Administered 2018-09-25 – 2018-09-29 (×16): 1 via ORAL
  Filled 2018-09-25 (×16): qty 1

## 2018-09-25 MED ORDER — PANTOPRAZOLE SODIUM 40 MG PO TBEC
40.0000 mg | DELAYED_RELEASE_TABLET | Freq: Every day | ORAL | Status: DC
  Administered 2018-09-26 – 2018-09-29 (×4): 40 mg via ORAL
  Filled 2018-09-25 (×4): qty 1

## 2018-09-25 MED ORDER — DEXTROSE 50 % IV SOLN
INTRAVENOUS | Status: AC
  Administered 2018-09-25: 50 mL
  Filled 2018-09-25: qty 50

## 2018-09-25 MED ORDER — INSULIN ASPART 100 UNIT/ML ~~LOC~~ SOLN: 0.0000 [IU] | Freq: Every day | SUBCUTANEOUS | Status: DC

## 2018-09-25 MED ORDER — MORPHINE SULFATE (PF) 4 MG/ML IV SOLN
4.0000 mg | Freq: Once | INTRAVENOUS | Status: AC
  Administered 2018-09-25: 4 mg via INTRAVENOUS
  Filled 2018-09-25: qty 1

## 2018-09-25 NOTE — ED Notes (Signed)
Pt c/o worsening right sided abdominal pain since Thursday. Pt states that she is now having noticeable abdominal swelling which is making it difficult for her to take deep breaths. Pt also states she is nauseas and has been unable to keep anything down since 3am. Pt also states the last time she was able to pee was 3am.  Pt c/o 9/10 right sided abdominal pain.

## 2018-09-25 NOTE — ED Triage Notes (Signed)
C/O upper abdominal pain and right sided abdominal pain.  States has history of pancreatitis, and symptoms feel similar.  Symptoms started this morning.  Also C/O vomiting.

## 2018-09-25 NOTE — ED Notes (Signed)
First Nurse Note: Pt to ED for abd pain. Pt is in NAD

## 2018-09-25 NOTE — Progress Notes (Signed)
Pt requesting sleep med, prime doc paged

## 2018-09-25 NOTE — H&P (Signed)
[place] [place] NAME: Karen [name]    MR#:  [id number]  DATE OF BIRTH:  01-27-1955  DATE OF ADMISSION:  09/25/2018  PRIMARY CARE PHYSICIAN: Novella Rob, FNP   REQUESTING/REFERRING PHYSICIAN: Dr. Corky Downs.  CHIEF COMPLAINT:   Chief Complaint  Patient presents with  . Abdominal Pain   Abdominal pain for 1 week. HISTORY OF PRESENT ILLNESS:  Karen [name]  is a 63 y.o. female with a known history of chronic pancreatitis, hypertension and diabetes etc.  The patient presents the ED with above chief complaints.  She has had abdominal pain on and off for the past 1 week, which has been worsening.  She also complains for nausea and vomiting but no diarrhea, melena or bloody stool.  She said she lost her medication.  Her lipase is chronically mild elevated.  Dr. Corky Downs requested admission for pain control. PAST MEDICAL HISTORY:   Past Medical History:  Diagnosis Date  . Alcoholic pancreatitis   . Asthma   . Depression   . Diabetes mellitus without complication ([place])   . DKA (diabetic ketoacidoses) ([place]) 02/17/2015  . Emphysematous cystitis   . Hypercholesteremia   . Hypertension   . Hypokalemia     PAST SURGICAL HISTORY:   Past Surgical History:  Procedure Laterality Date  . ABDOMINAL HYSTERECTOMY  1996  . APPENDECTOMY  1997  . ESOPHAGOGASTRODUODENOSCOPY N/A 11/12/2017   Procedure: ESOPHAGOGASTRODUODENOSCOPY (EGD);  Surgeon: Lin Landsman, MD;  Location: [facility] ENDOSCOPY;  Service: Gastroenterology;  Laterality: N/A;  . ESOPHAGOGASTRODUODENOSCOPY (EGD) WITH PROPOFOL N/A 04/24/2016   Procedure: ESOPHAGOGASTRODUODENOSCOPY (EGD) WITH PROPOFOL;  Surgeon: [name], MD;  Location: [facility] ENDOSCOPY;  Service: Endoscopy;  Laterality: N/A;  . HAND SURGERY  1988  . THYROID SURGERY  2013    SOCIAL HISTORY:   Social History   Tobacco Use  . Smoking status: Former Research scientist (life sciences)  . Smokeless tobacco: Never Used  Substance Use Topics  .  Alcohol use: Not Currently    Comment: 40oz/day    FAMILY HISTORY:   Family History  Family history unknown: Yes    DRUG ALLERGIES:   Allergies  Allergen Reactions  . Penicillins Anaphylaxis    Has patient had a PCN reaction causing immediate rash, facial/tongue/throat swelling, SOB or lightheadedness with hypotension: Yes Has patient had a PCN reaction causing severe rash involving mucus membranes or skin necrosis: No Has patient had a PCN reaction that required hospitalization No Has patient had a PCN reaction occurring within the last 10 years: No If all of the above answers are "NO", then may proceed with Cephalosporin use.  . Reglan [Metoclopramide] Other (See Comments)    Hypotension, shortness of breath  . Percocet [Oxycodone-Acetaminophen] Rash    REVIEW OF SYSTEMS:   Review of Systems  Constitutional: Negative for chills, fever and malaise/fatigue.  HENT: Negative for sore throat.   Eyes: Negative for blurred vision and double vision.  Respiratory: Negative for cough, hemoptysis, shortness of breath, wheezing and stridor.   Cardiovascular: Negative for chest pain, palpitations, orthopnea and leg swelling.  Gastrointestinal: Positive for abdominal pain, nausea and vomiting. Negative for blood in stool, diarrhea and melena.  Genitourinary: Negative for dysuria, flank pain and hematuria.  Musculoskeletal: Negative for back pain and joint pain.  Skin: Negative for rash.  Neurological: Negative for dizziness, sensory change, focal weakness, seizures, loss of consciousness, weakness and headaches.  Endo/Heme/Allergies: Negative for polydipsia.  Psychiatric/Behavioral: Negative for depression. The patient is not nervous/anxious.  MEDICATIONS AT HOME:   Prior to Admission medications   Medication Sig Start Date End Date Taking? Authorizing Provider  amLODipine (NORVASC) 5 MG tablet Take 1 tablet (5 mg total) by mouth daily. 09/22/18   Merlyn Lot, MD   atorvastatin (LIPITOR) 40 MG tablet Take 1 tablet (40 mg total) by mouth at bedtime. 09/22/18   Merlyn Lot, MD  clindamycin (CLEOCIN) 300 MG capsule Take 1 capsule (300 mg total) by mouth 3 (three) times daily. 03/12/18   Delman Kitten, MD  cyclobenzaprine (FLEXERIL) 5 MG tablet Take 1 tablet (5 mg total) by mouth 2 (two) times daily. Patient not taking: Reported on 03/12/2018 01/07/17   Dustin Flock, MD  doxycycline (VIBRAMYCIN) 100 MG capsule Take 1 capsule (100 mg total) by mouth 2 (two) times daily. 03/12/18   Delman Kitten, MD  flurazepam (DALMANE) 15 MG capsule Take 1 capsule (15 mg total) by mouth at bedtime as needed for sleep. 01/07/17   Dustin Flock, MD  gabapentin (NEURONTIN) 100 MG capsule Take 100 mg by mouth 2 (two) times daily. 01/27/18   [provider]  HYDROcodone-acetaminophen (NORCO) 5-325 MG tablet Take 1 tablet by mouth every 4 (four) hours as needed for moderate pain. 09/22/18   Merlyn Lot, MD  HYDROcodone-acetaminophen (NORCO/VICODIN) 5-325 MG tablet Take 1 tablet by mouth 4 (four) times daily.    [provider]  insulin aspart protamine - aspart (NOVOLOG MIX 70/30 FLEXPEN) (70-30) 100 UNIT/ML FlexPen Inject 0.15 mLs (15 Units total) into the skin 2 (two) times daily. 09/22/18   Merlyn Lot, MD  lipase/protease/amylase (CREON) 36000 UNITS CPEP capsule Take 2 capsules (72,000 Units total) by mouth 3 (three) times daily with meals. 03/12/18   Delman Kitten, MD  lipase/protease/amylase (CREON) 36000 UNITS CPEP capsule Take 1 capsule (36,000 Units total) by mouth 2 (two) times daily with a meal. Take this with snacks up to twice a day. 09/22/18   Merlyn Lot, MD  loperamide (IMODIUM) 2 MG capsule Take 1 capsule (2 mg total) by mouth every 6 (six) hours as needed for diarrhea or loose stools. 11/13/17   Fritzi Mandes, MD  omeprazole (PRILOSEC) 40 MG capsule Take 1 capsule (40 mg total) by mouth 2 (two) times daily for 14 days. 09/22/18 10/06/18  Merlyn Lot, MD  ondansetron (ZOFRAN ODT) 4 MG disintegrating tablet Take 1 tablet (4 mg total) by mouth every 8 (eight) hours as needed for nausea or vomiting. 09/22/18   Merlyn Lot, MD  GLUCERNA [facility]) LIQD Take 237 mLs by mouth 3 (three) times daily between meals. 02/19/15 02/27/15  Bettey Costa, MD      VITAL SIGNS:  Blood pressure (!) 152/82, pulse 87, temperature 98.2 F (36.8 C), temperature source Oral, resp. rate (!) 21, height 5\' 1"  (1.549 m), weight 51.7 kg, SpO2 98 %.  PHYSICAL EXAMINATION:  Physical Exam  GENERAL:  63 y.o.-year-old patient lying in the bed with no acute distress.  EYES: Pupils equal, round, reactive to light and accommodation. No scleral icterus. Extraocular muscles intact.  HEENT: Head atraumatic, normocephalic. NECK:  Supple, no jugular venous distention. No thyroid enlargement, no tenderness.  LUNGS: Normal breath sounds bilaterally, no wheezing, rales,rhonchi or crepitation. No use of accessory muscles of respiration.  CARDIOVASCULAR: S1, S2 normal. No murmurs, rubs, or gallops.  ABDOMEN: Soft, tenderness in middle part, nondistended. Bowel sounds present. No organomegaly or mass.  EXTREMITIES: No pedal edema, cyanosis, or clubbing.  NEUROLOGIC: Cranial nerves II through XII are intact. Muscle strength 5/5 in all  extremities. Sensation intact. Gait not checked.  PSYCHIATRIC: The patient is alert and oriented x 3.  SKIN: No obvious rash, lesion, or ulcer.   LABORATORY PANEL:   CBC Recent Labs  Lab 09/25/18 1138  WBC 9.1  HGB 12.1  HCT 35.7*  PLT 232   ------------------------------------------------------------------------------------------------------------------  Chemistries  Recent Labs  Lab 09/25/18 1138  NA 135  K 4.2  CL 104  CO2 22  GLUCOSE 366*  BUN 32*  CREATININE 1.41*  CALCIUM 8.8*  AST 155*  ALT 114*  ALKPHOS 303*  BILITOT 0.6    ------------------------------------------------------------------------------------------------------------------  Cardiac Enzymes No results for input(s): TROPONINI in the last 168 hours. ------------------------------------------------------------------------------------------------------------------  RADIOLOGY:  No results found.    IMPRESSION AND PLAN:   Abdominal pain exacerbation due to chronic pancreatitis. The patient will be placed for observation. N.p.o. except medication and water, IV fluid support, pain control.  Acute renal failure due to dehydration. IV fluid support and follow-up BMP. Abnormal liver function test, possible due to history of alcohol abuse.  Follow-up LFT. Hyperglycemia due to diabetes.  Start sliding scale and continue home NovoLog 70/30.  All the records are reviewed and case discussed with ED provider. Management plans discussed with the patient, family and they are in agreement.  CODE STATUS: Full code.  TOTAL TIME TAKING CARE OF THIS PATIENT: 46 minutes.    Demetrios Loll M.D on 09/25/2018 at 1:33 PM  Between 7am to 6pm - Pager - [phone]  After 6pm go to [email or web address] - Proofreader  [facility]  Office  [phone]  CC: Primary care physician; Novella Rob, FNP   Note: This dictation was prepared with Dragon dictation along with smaller phrase technology. Any transcriptional errors that result from this process are unin

## 2018-09-25 NOTE — ED Provider Notes (Signed)
[facility] Emergency Department Provider Note   ____________________________________________    I have reviewed the triage vital signs and the nursing notes.   HISTORY  Chief Complaint Abdominal Pain     HPI Karen [name] is a 63 y.o. female who presents with complaints of epigastric abdominal pain.  Patient has a history of diabetes, pancreatitis, was seen here 2 days ago for epigastric pain, treated had been feeling better but reports her pain has worsened now.  This pain is typical of her pancreatic pain, she describes it as an epigastric and left upper quadrant pain.  No fevers or chills.  Positive nausea no vomiting.  Has been taking medications as prescribed with no improvement.  Past Medical History:  Diagnosis Date  . Alcoholic pancreatitis   . Asthma   . Depression   . Diabetes mellitus without complication ([place])   . DKA (diabetic ketoacidoses) ([place]) 02/17/2015  . Emphysematous cystitis   . Hypercholesteremia   . Hypertension   . Hypokalemia     Patient Active Problem List   Diagnosis Date Noted  . 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 Coffee ground emesis   . Upper GI bleed 11/11/2017  . Pancreatitis, acute 01/03/2017  . ETOH abuse 01/03/2017  . UTI (urinary tract infection) 01/03/2017  . Nausea and vomiting   . Esophageal candidiasis ([place])   . Intractable vomiting with nausea   . Hypertensive urgency 04/21/2016  . Abdominal pain   . Acute renal failure ([place])   . Nausea vomiting and diarrhea   . Smoker   . Poorly controlled type 2 diabetes mellitus ([place])   . Acute renal insufficiency 10/29/2015  . Elevated troponin 10/29/2015  . Urinary tract infection without hematuria 10/29/2015  . Left flank pain 10/29/2015  . Malignant essential hypertension 10/29/2015  . DKA (diabetic ketoacidoses) ([place]) 02/17/2015  . Emphysematous cystitis     Past Surgical History:  Procedure Laterality Date  . ABDOMINAL HYSTERECTOMY  1996   . APPENDECTOMY  1997  . ESOPHAGOGASTRODUODENOSCOPY N/A 11/12/2017   Procedure: ESOPHAGOGASTRODUODENOSCOPY (EGD);  Surgeon: Lin Landsman, MD;  Location: [facility] ENDOSCOPY;  Service: Gastroenterology;  Laterality: N/A;  . ESOPHAGOGASTRODUODENOSCOPY (EGD) WITH PROPOFOL N/A 04/24/2016   Procedure: ESOPHAGOGASTRODUODENOSCOPY (EGD) WITH PROPOFOL;  Surgeon: [name], MD;  Location: [facility] ENDOSCOPY;  Service: Endoscopy;  Laterality: N/A;  . HAND SURGERY  1988  . THYROID SURGERY  2013    Prior to Admission medications   Medication Sig Start Date End Date Taking? Authorizing Provider  atorvastatin (LIPITOR) 40 MG tablet Take 1 tablet (40 mg total) by mouth at bedtime. 09/22/18  Yes Merlyn Lot, MD  cyclobenzaprine (FLEXERIL) 5 MG tablet Take 1 tablet (5 mg total) by mouth 2 (two) times daily. 01/07/17  Yes Dustin Flock, MD  gabapentin (NEURONTIN) 100 MG capsule Take 100 mg by mouth 2 (two) times daily. 01/27/18  Yes [provider]  insulin aspart protamine - aspart (NOVOLOG MIX 70/30 FLEXPEN) (70-30) 100 UNIT/ML FlexPen Inject 0.15 mLs (15 Units total) into the skin 2 (two) times daily. 09/22/18  Yes Merlyn Lot, MD  omeprazole (PRILOSEC) 40 MG capsule Take 1 capsule (40 mg total) by mouth 2 (two) times daily for 14 days. 09/22/18 10/06/18 Yes Merlyn Lot, MD  amLODipine (NORVASC) 5 MG tablet Take 1 tablet (5 mg total) by mouth daily. 09/22/18   Merlyn Lot, MD  clindamycin (CLEOCIN) 300 MG capsule Take 1 capsule (300 mg total) by mouth 3 (three) times daily. 03/12/18  Delman Kitten, MD  doxycycline (VIBRAMYCIN) 100 MG capsule Take 1 capsule (100 mg total) by mouth 2 (two) times daily. 03/12/18   Delman Kitten, MD  flurazepam (DALMANE) 15 MG capsule Take 1 capsule (15 mg total) by mouth at bedtime as needed for sleep. 01/07/17   Dustin Flock, MD  HYDROcodone-acetaminophen (NORCO) 5-325 MG tablet Take 1 tablet by mouth every 4 (four) hours as needed for moderate pain. 09/22/18    Merlyn Lot, MD  HYDROcodone-acetaminophen (NORCO/VICODIN) 5-325 MG tablet Take 1 tablet by mouth 4 (four) times daily.    [provider]  lipase/protease/amylase (CREON) 36000 UNITS CPEP capsule Take 2 capsules (72,000 Units total) by mouth 3 (three) times daily with meals. 03/12/18   Delman Kitten, MD  lipase/protease/amylase (CREON) 36000 UNITS CPEP capsule Take 1 capsule (36,000 Units total) by mouth 2 (two) times daily with a meal. Take this with snacks up to twice a day. 09/22/18   Merlyn Lot, MD  loperamide (IMODIUM) 2 MG capsule Take 1 capsule (2 mg total) by mouth every 6 (six) hours as needed for diarrhea or loose stools. 11/13/17   Fritzi Mandes, MD  ondansetron (ZOFRAN ODT) 4 MG disintegrating tablet Take 1 tablet (4 mg total) by mouth every 8 (eight) hours as needed for nausea or vomiting. 09/22/18   Merlyn Lot, MD  GLUCERNA [facility]) LIQD Take 237 mLs by mouth 3 (three) times daily between meals. 02/19/15 02/27/15  Bettey Costa, MD     Allergies Penicillins, Reglan [metoclopramide], and Percocet [oxycodone-acetaminophen]  Family History  Family history unknown: Yes    Social History Social History   Tobacco Use  . Smoking status: Former Research scientist (life sciences)  . Smokeless tobacco: Never Used  Substance Use Topics  . Alcohol use: Not Currently    Comment: 40oz/day  . Drug use: No    Review of Systems  Constitutional: No fever/chills Eyes: No visual changes.  ENT: No sore throat. Cardiovascular: Denies chest pain. Respiratory: Denies shortness of breath. Gastrointestinal: As above Genitourinary: Negative for dysuria. Musculoskeletal: Negative for back pain. Skin: Negative for rash. Neurological: Negative for headaches or weakness   ____________________________________________   PHYSICAL EXAM:  VITAL SIGNS: ED Triage Vitals [09/25/18 1136]  Enc Vitals Group     BP (!) 147/77     Pulse Rate 93     Resp 16     Temp 98.2 F (36.8 C)     Temp Source  Oral     SpO2 99 %     Weight 51.7 kg (113 lb 15.7 oz)     Height 1.549 m (5\' 1" )     Head Circumference      Peak Flow      Pain Score 9     Pain Loc      Pain Edu?      Excl. in [place]?     Constitutional: Alert and oriented.   Nose: No congestion/rhinnorhea. Mouth/Throat: Mucous membranes are moist.    Cardiovascular: Normal rate, regular rhythm. Grossly normal heart sounds.  Good peripheral circulation. Respiratory: Normal respiratory effort.  No retractions. Lungs CTAB. Gastrointestinal: Tenderness palpation epigastrium, no distention, no CVA tenderness  Musculoskeletal:   Warm and well perfused Neurologic:  Normal speech and language. No gross focal neurologic deficits are appreciated.  Skin:  Skin is warm, dry and intact. No rash noted. Psychiatric: Mood and affect are normal. Speech and behavior are normal.  ____________________________________________   LABS (all labs ordered are listed, but only abnormal results are displayed)  Labs  Reviewed  LIPASE, BLOOD - Abnormal; Notable for the following components:      Result Value   Lipase 105 (*)    All other components within normal limits  COMPREHENSIVE METABOLIC PANEL - Abnormal; Notable for the following components:   Glucose, Bld 366 (*)    BUN 32 (*)    Creatinine, Ser 1.41 (*)    Calcium 8.8 (*)    AST 155 (*)    ALT 114 (*)    Alkaline Phosphatase 303 (*)    GFR calc non Af Amer 40 (*)    GFR calc Af Amer 46 (*)    All other components within normal limits  CBC - Abnormal; Notable for the following components:   HCT 35.7 (*)    All other components within normal limits  SARS CORONAVIRUS 2 (TAT 6-24 HRS)  URINALYSIS, COMPLETE (UACMP) WITH MICROSCOPIC   ____________________________________________  EKG  ED ECG REPORT I, Lavonia Drafts, the attending physician, personally viewed and interpreted this ECG.  Date: 09/25/2018  Rhythm: normal sinus rhythm QRS Axis: normal Intervals: normal ST/T Wave  abnormalities: normal Narrative Interpretation: no evidence of acute ischemia  ____________________________________________  RADIOLOGY  CT scan performed 2 days ago unremarkable ____________________________________________   PROCEDURES  Procedure(s) performed: No  Procedures   Critical Care performed: No ____________________________________________   INITIAL IMPRESSION / ASSESSMENT AND PLAN / ED COURSE  Pertinent labs & imaging results that were available during my care of the patient were reviewed by me and considered in my medical decision making (see chart for details).  Patient presents with epigastric pain consistent with her pancreatitis, she had CT scan done 2 days ago which was unremarkable, she reports her pain is the same as it was 2 days ago and has worsened somewhat, this is consistent with her chronic pancreatitis.  Will treat with IV morphine, IV Zofran, to the hospital service for failed outpatient management    ____________________________________________   FINAL CLINICAL IMPRESSION(S) / ED DIAGNOSES  Final diagnoses:  Acute pancreatitis, unspecified complication status, unspecified pancreatitis type        Note:  This document was prepared using Dragon voice recognition software and may include unintentional dictation errors.   Lavonia Drafts, MD 09/25/18 1339

## 2018-09-26 DIAGNOSIS — R945 Abnormal results of liver function studies: Secondary | ICD-10-CM | POA: Diagnosis present

## 2018-09-26 DIAGNOSIS — Z20828 Contact with and (suspected) exposure to other viral communicable diseases: Secondary | ICD-10-CM | POA: Diagnosis present

## 2018-09-26 DIAGNOSIS — Z79891 Long term (current) use of opiate analgesic: Secondary | ICD-10-CM | POA: Diagnosis not present

## 2018-09-26 DIAGNOSIS — F1010 Alcohol abuse, uncomplicated: Secondary | ICD-10-CM | POA: Diagnosis present

## 2018-09-26 DIAGNOSIS — E785 Hyperlipidemia, unspecified: Secondary | ICD-10-CM | POA: Diagnosis present

## 2018-09-26 DIAGNOSIS — N390 Urinary tract infection, site not specified: Secondary | ICD-10-CM | POA: Diagnosis present

## 2018-09-26 DIAGNOSIS — Z885 Allergy status to narcotic agent status: Secondary | ICD-10-CM | POA: Diagnosis not present

## 2018-09-26 DIAGNOSIS — E7800 Pure hypercholesterolemia, unspecified: Secondary | ICD-10-CM | POA: Diagnosis present

## 2018-09-26 DIAGNOSIS — K859 Acute pancreatitis without necrosis or infection, unspecified: Secondary | ICD-10-CM | POA: Diagnosis present

## 2018-09-26 DIAGNOSIS — K861 Other chronic pancreatitis: Secondary | ICD-10-CM | POA: Diagnosis present

## 2018-09-26 DIAGNOSIS — Z90710 Acquired absence of both cervix and uterus: Secondary | ICD-10-CM | POA: Diagnosis not present

## 2018-09-26 DIAGNOSIS — N179 Acute kidney failure, unspecified: Secondary | ICD-10-CM | POA: Diagnosis present

## 2018-09-26 DIAGNOSIS — Z794 Long term (current) use of insulin: Secondary | ICD-10-CM | POA: Diagnosis not present

## 2018-09-26 DIAGNOSIS — N898 Other specified noninflammatory disorders of vagina: Secondary | ICD-10-CM | POA: Diagnosis present

## 2018-09-26 DIAGNOSIS — K219 Gastro-esophageal reflux disease without esophagitis: Secondary | ICD-10-CM | POA: Diagnosis present

## 2018-09-26 DIAGNOSIS — J45909 Unspecified asthma, uncomplicated: Secondary | ICD-10-CM | POA: Diagnosis present

## 2018-09-26 DIAGNOSIS — E1165 Type 2 diabetes mellitus with hyperglycemia: Secondary | ICD-10-CM | POA: Diagnosis present

## 2018-09-26 DIAGNOSIS — E1140 Type 2 diabetes mellitus with diabetic neuropathy, unspecified: Secondary | ICD-10-CM | POA: Diagnosis present

## 2018-09-26 DIAGNOSIS — I10 Essential (primary) hypertension: Secondary | ICD-10-CM | POA: Diagnosis present

## 2018-09-26 DIAGNOSIS — Z87891 Personal history of nicotine dependence: Secondary | ICD-10-CM | POA: Diagnosis not present

## 2018-09-26 DIAGNOSIS — E860 Dehydration: Secondary | ICD-10-CM | POA: Diagnosis present

## 2018-09-26 DIAGNOSIS — Z880 Allergy status to penicillin: Secondary | ICD-10-CM | POA: Diagnosis not present

## 2018-09-26 DIAGNOSIS — Z888 Allergy status to other drugs, medicaments and biological substances status: Secondary | ICD-10-CM | POA: Diagnosis not present

## 2018-09-26 DIAGNOSIS — Z23 Encounter for immunization: Secondary | ICD-10-CM | POA: Diagnosis not present

## 2018-09-26 LAB — BASIC METABOLIC PANEL
Anion gap: 9 (ref 5–15)
BUN: 22 mg/dL (ref 8–23)
CO2: 22 mmol/L (ref 22–32)
Calcium: 8.6 mg/dL — ABNORMAL LOW (ref 8.9–10.3)
Chloride: 108 mmol/L (ref 98–111)
Creatinine, Ser: 1.23 mg/dL — ABNORMAL HIGH (ref 0.44–1.00)
GFR calc Af Amer: 55 mL/min — ABNORMAL LOW (ref >60)
GFR calc non Af Amer: 47 mL/min — ABNORMAL LOW (ref >60)
Glucose, Bld: 109 mg/dL — ABNORMAL HIGH (ref 70–99)
Potassium: 4 mmol/L (ref 3.5–5.1)
Sodium: 139 mmol/L (ref 135–145)

## 2018-09-26 LAB — CBC
HCT: 35.8 % — ABNORMAL LOW (ref 36.0–46.0)
Hemoglobin: 12 g/dL (ref 12.0–15.0)
MCH: 28.1 pg (ref 26.0–34.0)
MCHC: 33.5 g/dL (ref 30.0–36.0)
MCV: 83.8 fL (ref 80.0–100.0)
Platelets: 215 10*3/uL (ref 150–400)
RBC: 4.27 MIL/uL (ref 3.87–5.11)
RDW: 12.2 % (ref 11.5–15.5)
WBC: 15.1 10*3/uL — ABNORMAL HIGH (ref 4.0–10.5)
nRBC: 0 % (ref 0.0–0.2)

## 2018-09-26 LAB — SARS CORONAVIRUS 2 (TAT 6-24 HRS): SARS Coronavirus 2: NEGATIVE

## 2018-09-26 LAB — GLUCOSE, CAPILLARY
Glucose-Capillary: 176 mg/dL — ABNORMAL HIGH (ref 70–99)
Glucose-Capillary: 77 mg/dL (ref 70–99)
Glucose-Capillary: 352 mg/dL — ABNORMAL HIGH (ref 70–99)
Glucose-Capillary: 357 mg/dL — ABNORMAL HIGH (ref 70–99)
Glucose-Capillary: 74 mg/dL (ref 70–99)
Glucose-Capillary: 70 mg/dL (ref 70–99)

## 2018-09-26 LAB — HEPATIC FUNCTION PANEL
ALT: 72 U/L — ABNORMAL HIGH (ref 0–44)
AST: 49 U/L — ABNORMAL HIGH (ref 15–41)
Albumin: 3.2 g/dL — ABNORMAL LOW (ref 3.5–5.0)
Alkaline Phosphatase: 219 U/L — ABNORMAL HIGH (ref 38–126)
Bilirubin, Direct: <0.1 mg/dL (ref 0.0–0.2)
Total Bilirubin: 0.8 mg/dL (ref 0.3–1.2)
Total Protein: 6.4 g/dL — ABNORMAL LOW (ref 6.5–8.1)

## 2018-09-26 MED ORDER — ACETAMINOPHEN 325 MG PO TABS
650.0000 mg | ORAL_TABLET | Freq: Once | ORAL | Status: AC
  Administered 2018-09-26: 650 mg via ORAL
  Filled 2018-09-26: qty 2

## 2018-09-26 MED ORDER — DEXTROSE-NACL 5-0.9 % IV SOLN
INTRAVENOUS | Status: DC
  Administered 2018-09-26: 14:00:00 via INTRAVENOUS

## 2018-09-26 MED ORDER — SODIUM CHLORIDE 0.9 % IV SOLN
INTRAVENOUS | Status: DC
  Administered 2018-09-26 – 2018-09-29 (×4): via INTRAVENOUS

## 2018-09-26 MED ORDER — MORPHINE SULFATE (PF) 2 MG/ML IV SOLN
1.0000 mg | INTRAVENOUS | Status: DC | PRN
  Administered 2018-09-26 – 2018-09-29 (×2): 1 mg via INTRAVENOUS
  Filled 2018-09-26 (×2): qty 1

## 2018-09-26 NOTE — Progress Notes (Signed)
[place] at [place] NAME: Karen [name]    MR#:  [id number]  DATE OF BIRTH:  [birth year]  SUBJECTIVE:   Pt. Here due to abdominal pain, N/V and suspected to have acute on chronic pancreatitis.   Attempted to give her Clear liquids today and she vomited.    REVIEW OF SYSTEMS:    Review of Systems  Constitutional: Negative for chills and fever.  HENT: Negative for congestion and tinnitus.   Eyes: Negative for blurred vision and double vision.  Respiratory: Negative for cough, shortness of breath and wheezing.   Cardiovascular: Negative for chest pain, orthopnea and PND.  Gastrointestinal: Positive for abdominal pain, nausea and vomiting. Negative for diarrhea.  Genitourinary: Negative for dysuria and hematuria.  Neurological: Negative for dizziness, sensory change and focal weakness.  All other systems reviewed and are negative.   Nutrition: NPO Tolerating Diet: Yes Tolerating PT:  Ambulatory   DRUG ALLERGIES:   Allergies  Allergen Reactions  . Penicillins Anaphylaxis    Has patient had a PCN reaction causing immediate rash, facial/tongue/throat swelling, SOB or lightheadedness with hypotension: Yes Has patient had a PCN reaction causing severe rash involving mucus membranes or skin necrosis: No Has patient had a PCN reaction that required hospitalization No Has patient had a PCN reaction occurring within the last 10 years: No If all of the above answers are "NO", then may proceed with Cephalosporin use.  . Reglan [Metoclopramide] Other (See Comments)    Hypotension, shortness of breath  . Percocet [Oxycodone-Acetaminophen] Rash    VITALS:  Blood pressure (!) 144/79, pulse 80, temperature 98.5 F (36.9 C), resp. rate 16, height 5\' 1"  (1.549 m), weight 51.7 kg, SpO2 99 %.  PHYSICAL EXAMINATION:   Physical Exam  GENERAL:  63 y.o.-year-old patient lying in bed in no acute distress.  EYES: Pupils equal, round, reactive to  light and accommodation. No scleral icterus. Extraocular muscles intact.  HEENT: Head atraumatic, normocephalic. Oropharynx and nasopharynx clear.  NECK:  Supple, no jugular venous distention. No thyroid enlargement, no tenderness.  LUNGS: Normal breath sounds bilaterally, no wheezing, rales, rhonchi. No use of accessory muscles of respiration.  CARDIOVASCULAR: S1, S2 normal. No murmurs, rubs, or gallops.  ABDOMEN: Soft, Epigastric abdominal pain, No rebound, rigidity, nondistended. Bowel sounds present. No organomegaly or mass.  EXTREMITIES: No cyanosis, clubbing or edema b/l.    NEUROLOGIC: Cranial nerves II through XII are intact. No focal Motor or sensory deficits b/l.   PSYCHIATRIC: The patient is alert and oriented x 3.  SKIN: No obvious rash, lesion, or ulcer.    LABORATORY PANEL:   CBC Recent Labs  Lab 09/25/18 1138  WBC 9.1  HGB 12.1  HCT 35.7*  PLT 232   ------------------------------------------------------------------------------------------------------------------  Chemistries  Recent Labs  Lab 09/25/18 1138 09/26/18 0441  NA 135  --   K 4.2  --   CL 104  --   CO2 22  --   GLUCOSE 366*  --   BUN 32*  --   CREATININE 1.41*  --   CALCIUM 8.8*  --   AST 155* 49*  ALT 114* 72*  ALKPHOS 303* 219*  BILITOT 0.6 0.8   ------------------------------------------------------------------------------------------------------------------  Cardiac Enzymes No results for input(s): TROPONINI in the last 168 hours. ------------------------------------------------------------------------------------------------------------------  RADIOLOGY:  No results found.   ASSESSMENT AND PLAN:   63 yo female w/ hx of HTN, Hyperlipidemia, DM, previous hx of DKA, Depression, hx of alcoholic pancreatitis who  presented to the hospital due to abdominal pain nausea or vomiting.   1.  Acute pancreatitis-this is likely acute on chronic pancreatitis.  Patient has a previous history of  pancreatitis. -CT abdomen pelvis although shows no acute abnormalities.  Lipase was only mildly elevated. -Continue supportive care with IV fluids, antiemetics, pain control.  Attempted to start clear liquids today but patient started to vomit again. -Continue Creon supplements.  2.  Essential hypertension-continue Norvasc.  3.  Diabetes type 2 with neuropathy- continue sliding scale insulin.  4.  Diabetic neuropathy-continue gabapentin.  5.  GERD-continue Protonix.  6.  Hyperlipidemia-continue atorvastatin.  All the records are reviewed and case discussed with Care Management/Social Worker. Management plans discussed with the patient, family and they are in agreement.  CODE STATUS: Full code  DVT Prophylaxis: Lovenox  TOTAL TIME TAKING CARE OF THIS PATIENT: 30 minutes.   POSSIBLE D/C IN 1-2 DAYS, DEPENDING ON CLINICAL CONDITION.   Henreitta Leber M.D on 09/26/2018 at 1:55 PM  Between 7am to 6pm - Pager - [phone]  After 6pm go to [email or web address] - Proofreader  [facility]  Office  [phone]  CC: Primary care physician; Novella Rob, FNP

## 2018-09-26 NOTE — Progress Notes (Signed)
RN notified MD pt was throwing up and complaining of pain after started eating. Per MD RN to change diet order back to NPO sips with meds, change IVF to NS with D5 at 75 ml/hr and also place order for 1 mg of morphine Q4 PRN and DC order for Toradol. RN will continue to assess and monitor pt.

## 2018-09-26 NOTE — Progress Notes (Signed)
Dr Jannifer Franklin notified BS 81, stated 1/2amp given of D50, orders given

## 2018-09-26 NOTE — Progress Notes (Signed)
Temperature trending down from 102.7 to 101.7 post Tylenol dosing; Patient states that she feels a little better; Barbaraann Faster, RN 10:11 PM 09/26/2018

## 2018-09-26 NOTE — Progress Notes (Signed)
RN notified MD pt BG is 352 now. Per MD okay for RN to DC ns with d5 and change it to only NS going at 75 ml/hr.

## 2018-09-26 NOTE — Progress Notes (Signed)
Dr. Jannifer Franklin notified of elevated temperature of 102.7 via secure chat; awaiting response; Last Norco given at 1726. Barbaraann Faster, RN 8:24 PM 09/26/2018

## 2018-09-27 ENCOUNTER — Inpatient Hospital Stay: Payer: Medicaid Other

## 2018-09-27 LAB — URINALYSIS, ROUTINE W REFLEX MICROSCOPIC
Bilirubin Urine: NEGATIVE
Glucose, UA: >=500 mg/dL — AB
Hgb urine dipstick: NEGATIVE
Ketones, ur: NEGATIVE mg/dL
Nitrite: NEGATIVE
Protein, ur: 100 mg/dL — AB
Specific Gravity, Urine: 1.009 (ref 1.005–1.030)
pH: 6 (ref 5.0–8.0)

## 2018-09-27 LAB — BASIC METABOLIC PANEL
Anion gap: 7 (ref 5–15)
BUN: 20 mg/dL (ref 8–23)
CO2: 22 mmol/L (ref 22–32)
Calcium: 8.3 mg/dL — ABNORMAL LOW (ref 8.9–10.3)
Chloride: 109 mmol/L (ref 98–111)
Creatinine, Ser: 1.38 mg/dL — ABNORMAL HIGH (ref 0.44–1.00)
GFR calc Af Amer: 48 mL/min — ABNORMAL LOW (ref >60)
GFR calc non Af Amer: 41 mL/min — ABNORMAL LOW (ref >60)
Glucose, Bld: 203 mg/dL — ABNORMAL HIGH (ref 70–99)
Potassium: 3.9 mmol/L (ref 3.5–5.1)
Sodium: 138 mmol/L (ref 135–145)

## 2018-09-27 LAB — LIPASE, BLOOD: Lipase: 41 U/L (ref 11–51)

## 2018-09-27 LAB — GLUCOSE, CAPILLARY
Glucose-Capillary: 181 mg/dL — ABNORMAL HIGH (ref 70–99)
Glucose-Capillary: 187 mg/dL — ABNORMAL HIGH (ref 70–99)
Glucose-Capillary: 189 mg/dL — ABNORMAL HIGH (ref 70–99)
Glucose-Capillary: 250 mg/dL — ABNORMAL HIGH (ref 70–99)
Glucose-Capillary: 262 mg/dL — ABNORMAL HIGH (ref 70–99)

## 2018-09-27 MED ORDER — CIPROFLOXACIN IN D5W 200 MG/100ML IV SOLN
200.0000 mg | Freq: Two times a day (BID) | INTRAVENOUS | Status: DC
  Administered 2018-09-27 – 2018-09-28 (×3): 200 mg via INTRAVENOUS
  Filled 2018-09-27 (×4): qty 100

## 2018-09-27 NOTE — Progress Notes (Signed)
[place] at [place] NAME: Karen [name]    MR#:  [id number]  DATE OF BIRTH:  1955/05/06  SUBJECTIVE:   Patient complaining of abdominal distention today.  Passing flatus.  She is also having some greenish vaginal discharge.  Still has some dysuria.   REVIEW OF SYSTEMS:    Review of Systems  Constitutional: Negative for chills and fever.  HENT: Negative for congestion and tinnitus.   Eyes: Negative for blurred vision and double vision.  Respiratory: Negative for cough, shortness of breath and wheezing.   Cardiovascular: Negative for chest pain, orthopnea and PND.  Gastrointestinal: Positive for abdominal pain. Negative for diarrhea, nausea and vomiting.  Genitourinary: Negative for dysuria and hematuria.  Neurological: Negative for dizziness, sensory change and focal weakness.  All other systems reviewed and are negative.   Nutrition: Clear liquids Tolerating Diet: Yes Tolerating PT:  Ambulatory   DRUG ALLERGIES:   Allergies  Allergen Reactions   Penicillins Anaphylaxis    Has patient had a PCN reaction causing immediate rash, facial/tongue/throat swelling, SOB or lightheadedness with hypotension: Yes Has patient had a PCN reaction causing severe rash involving mucus membranes or skin necrosis: No Has patient had a PCN reaction that required hospitalization No Has patient had a PCN reaction occurring within the last 10 years: No If all of the above answers are "NO", then may proceed with Cephalosporin use.   Reglan [Metoclopramide] Other (See Comments)    Hypotension, shortness of breath   Percocet [Oxycodone-Acetaminophen] Rash    VITALS:  Blood pressure 117/88, pulse 90, temperature 98.5 F (36.9 C), temperature source Oral, resp. rate 17, height 5\' 1"  (1.549 m), weight 51.7 kg, SpO2 90 %.  PHYSICAL EXAMINATION:   Physical Exam  GENERAL:  63 y.o.-year-old patient lying in bed in no acute distress.  EYES: Pupils equal,  round, reactive to light and accommodation. No scleral icterus. Extraocular muscles intact.  HEENT: Head atraumatic, normocephalic. Oropharynx and nasopharynx clear.  NECK:  Supple, no jugular venous distention. No thyroid enlargement, no tenderness.  LUNGS: Normal breath sounds bilaterally, no wheezing, rales, rhonchi. No use of accessory muscles of respiration.  CARDIOVASCULAR: S1, S2 normal. No murmurs, rubs, or gallops.  ABDOMEN: Soft, Epigastric abdominal pain, slightly distended, No rebound, rigidity, nondistended. Bowel sounds present. No organomegaly or mass.  EXTREMITIES: No cyanosis, clubbing or edema b/l.    NEUROLOGIC: Cranial nerves II through XII are intact. No focal Motor or sensory deficits b/l.   PSYCHIATRIC: The patient is alert and oriented x 3.  SKIN: No obvious rash, lesion, or ulcer.    LABORATORY PANEL:   CBC Recent Labs  Lab 09/26/18 2036  WBC 15.1*  HGB 12.0  HCT 35.8*  PLT 215   ------------------------------------------------------------------------------------------------------------------  Chemistries  Recent Labs  Lab 09/26/18 0441  09/27/18 0459  NA  --    < > 138  K  --    < > 3.9  CL  --    < > 109  CO2  --    < > 22  GLUCOSE  --    < > 203*  BUN  --    < > 20  CREATININE  --    < > 1.38*  CALCIUM  --    < > 8.3*  AST 49*  --   --   ALT 72*  --   --   ALKPHOS 219*  --   --   BILITOT 0.8  --   --    < > =  values in this interval not displayed.   ------------------------------------------------------------------------------------------------------------------  Cardiac Enzymes No results for input(s): TROPONINI in the last 168 hours. ------------------------------------------------------------------------------------------------------------------  RADIOLOGY:  Dg Abd 1 View  Result Date: 09/27/2018 CLINICAL DATA:  Abdominal distension.  Suspected pancreatitis. EXAM: ABDOMEN - 1 VIEW COMPARISON:  CT of the abdomen and pelvis 09/22/2018  FINDINGS: The bowel gas pattern is normal. No radio-opaque calculi or other significant radiographic abnormality are seen. IMPRESSION: Negative one view abdomen. Electronically Signed   By: San Morelle M.D.   On: 09/27/2018 10:22     ASSESSMENT AND PLAN:   63 yo female w/ hx of HTN, Hyperlipidemia, DM, previous hx of DKA, Depression, hx of alcoholic pancreatitis who presented to the hospital due to abdominal pain nausea or vomiting.   1.  Acute pancreatitis-this is likely acute on chronic pancreatitis.  Patient has a previous history of pancreatitis. -CT abdomen pelvis although shows no acute abnormalities. Lipase has trended down.  -Continue supportive care with IV fluids, antiemetics, pain control.   -We will start patient on clear liquids today and advance as tolerated. -Continue Creon supplements.  2.  Abdominal distention- x-ray obtained this morning showing no evidence obstruction or constipation. -Continue supportive care, no need for NG tube and laxatives for now.  3. UTI - based off an urinalysis.  - will start on IV Ceftriaxone X 3 days.   4.  Essential hypertension-continue Norvasc.  5.  Diabetes type 2 with neuropathy- continue sliding scale insulin.  6.  Diabetic neuropathy-continue gabapentin.  7.  GERD-continue Protonix.  8.  Hyperlipidemia-continue atorvastatin.  All the records are reviewed and case discussed with Care Management/Social Worker. Management plans discussed with the patient, family and they are in agreement.  CODE STATUS: Full code  DVT Prophylaxis: Lovenox  TOTAL TIME TAKING CARE OF THIS PATIENT: 30 minutes.   POSSIBLE D/C IN 1-2 DAYS, DEPENDING ON CLINICAL CONDITION.   Henreitta Leber M.D on 09/27/2018 at 1:36 PM  Between 7am to 6pm - Pager - [phone]  After 6pm go to [email or web address] - Proofreader  [facility]  Office  [phone]  CC: Primary care physician; Novella Rob, FNP

## 2018-09-28 LAB — BASIC METABOLIC PANEL
Anion gap: 6 (ref 5–15)
BUN: 14 mg/dL (ref 8–23)
CO2: 23 mmol/L (ref 22–32)
Calcium: 8.3 mg/dL — ABNORMAL LOW (ref 8.9–10.3)
Chloride: 111 mmol/L (ref 98–111)
Creatinine, Ser: 1.23 mg/dL — ABNORMAL HIGH (ref 0.44–1.00)
GFR calc Af Amer: 55 mL/min — ABNORMAL LOW (ref >60)
GFR calc non Af Amer: 47 mL/min — ABNORMAL LOW (ref >60)
Glucose, Bld: 210 mg/dL — ABNORMAL HIGH (ref 70–99)
Potassium: 3.9 mmol/L (ref 3.5–5.1)
Sodium: 140 mmol/L (ref 135–145)

## 2018-09-28 LAB — LIPASE, BLOOD: Lipase: 45 U/L (ref 11–51)

## 2018-09-28 LAB — GLUCOSE, CAPILLARY
Glucose-Capillary: 205 mg/dL — ABNORMAL HIGH (ref 70–99)
Glucose-Capillary: 145 mg/dL — ABNORMAL HIGH (ref 70–99)
Glucose-Capillary: 236 mg/dL — ABNORMAL HIGH (ref 70–99)
Glucose-Capillary: 129 mg/dL — ABNORMAL HIGH (ref 70–99)

## 2018-09-28 MED ORDER — INSULIN ASPART PROT & ASPART (70-30 MIX) 100 UNIT/ML ~~LOC~~ SUSP
6.0000 [IU] | Freq: Two times a day (BID) | SUBCUTANEOUS | Status: DC
  Administered 2018-09-28 – 2018-09-29 (×2): 6 [IU] via SUBCUTANEOUS
  Filled 2018-09-28 (×2): qty 10

## 2018-09-28 MED ORDER — ONDANSETRON HCL 4 MG/2ML IJ SOLN
4.0000 mg | Freq: Four times a day (QID) | INTRAMUSCULAR | Status: DC
  Administered 2018-09-28 – 2018-09-29 (×6): 4 mg via INTRAVENOUS
  Filled 2018-09-28 (×6): qty 2

## 2018-09-28 MED ORDER — CIPROFLOXACIN IN D5W 400 MG/200ML IV SOLN
400.0000 mg | Freq: Two times a day (BID) | INTRAVENOUS | Status: DC
  Administered 2018-09-28 – 2018-09-29 (×2): 400 mg via INTRAVENOUS
  Filled 2018-09-28 (×4): qty 200

## 2018-09-28 MED ORDER — SODIUM CHLORIDE 0.9% FLUSH: 10.0000 mL | INTRAVENOUS | Status: DC | PRN

## 2018-09-28 NOTE — Progress Notes (Signed)
[facility] [place] NAME: Karen [name]    MR#:  [id number]  DATE OF BIRTH:  1955/07/11  SUBJECTIVE:   Patient still complaining of some nausea.  She cannot tolerate clear liquids.  Still complaining of some epigastric abdominal pain.  REVIEW OF SYSTEMS:    Review of Systems  Constitutional: Negative for chills and fever.  HENT: Negative for congestion and tinnitus.   Eyes: Negative for blurred vision and double vision.  Respiratory: Negative for cough, shortness of breath and wheezing.   Cardiovascular: Negative for chest pain, orthopnea and PND.  Gastrointestinal: Positive for abdominal pain and nausea. Negative for diarrhea and vomiting.  Genitourinary: Negative for dysuria and hematuria.  Neurological: Negative for dizziness, sensory change and focal weakness.  All other systems reviewed and are negative.   Nutrition: Clear liquids Tolerating Diet: little.  Tolerating PT:  Ambulatory   DRUG ALLERGIES:   Allergies  Allergen Reactions  . Penicillins Anaphylaxis    Has patient had a PCN reaction causing immediate rash, facial/tongue/throat swelling, SOB or lightheadedness with hypotension: Yes Has patient had a PCN reaction causing severe rash involving mucus membranes or skin necrosis: No Has patient had a PCN reaction that required hospitalization No Has patient had a PCN reaction occurring within the last 10 years: No If all of the above answers are "NO", then may proceed with Cephalosporin use.  . Reglan [Metoclopramide] Other (See Comments)    Hypotension, shortness of breath  . Percocet [Oxycodone-Acetaminophen] Rash    VITALS:  Blood pressure 122/78, pulse 73, temperature 98.6 F (37 C), temperature source Oral, resp. rate 20, height 5\' 1"  (1.549 m), weight 51.7 kg, SpO2 91 %.  PHYSICAL EXAMINATION:   Physical Exam  GENERAL:  63 y.o.-year-old patient lying in bed in no acute distress.  EYES: Pupils equal, round,  reactive to light and accommodation. No scleral icterus. Extraocular muscles intact.  HEENT: Head atraumatic, normocephalic. Oropharynx and nasopharynx clear.  NECK:  Supple, no jugular venous distention. No thyroid enlargement, no tenderness.  LUNGS: Normal breath sounds bilaterally, no wheezing, rales, rhonchi. No use of accessory muscles of respiration.  CARDIOVASCULAR: S1, S2 normal. No murmurs, rubs, or gallops.  ABDOMEN: Soft, Epigastric abdominal pain, slightly distended, No rebound, rigidity, nondistended. Bowel sounds present. No organomegaly or mass.  EXTREMITIES: No cyanosis, clubbing or edema b/l.    NEUROLOGIC: Cranial nerves II through XII are intact. No focal Motor or sensory deficits b/l.   PSYCHIATRIC: The patient is alert and oriented x 3.  SKIN: No obvious rash, lesion, or ulcer.    LABORATORY PANEL:   CBC Recent Labs  Lab 09/26/18 2036  WBC 15.1*  HGB 12.0  HCT 35.8*  PLT 215   ------------------------------------------------------------------------------------------------------------------  Chemistries  Recent Labs  Lab 09/26/18 0441  09/28/18 0525  NA  --    < > 140  K  --    < > 3.9  CL  --    < > 111  CO2  --    < > 23  GLUCOSE  --    < > 210*  BUN  --    < > 14  CREATININE  --    < > 1.23*  CALCIUM  --    < > 8.3*  AST 49*  --   --   ALT 72*  --   --   ALKPHOS 219*  --   --   BILITOT 0.8  --   --    < > =  values in this interval not displayed.   ------------------------------------------------------------------------------------------------------------------  Cardiac Enzymes No results for input(s): TROPONINI in the last 168 hours. ------------------------------------------------------------------------------------------------------------------  RADIOLOGY:  Dg Abd 1 View  Result Date: 09/27/2018 CLINICAL DATA:  Abdominal distension.  Suspected pancreatitis. EXAM: ABDOMEN - 1 VIEW COMPARISON:  CT of the abdomen and pelvis 09/22/2018  FINDINGS: The bowel gas pattern is normal. No radio-opaque calculi or other significant radiographic abnormality are seen. IMPRESSION: Negative one view abdomen. Electronically Signed   By: San Morelle M.D.   On: 09/27/2018 10:22     ASSESSMENT AND PLAN:   63 yo female w/ hx of HTN, Hyperlipidemia, DM, previous hx of DKA, Depression, hx of alcoholic pancreatitis who presented to the hospital due to abdominal pain nausea or vomiting.   1.  Acute pancreatitis-this is likely acute on chronic pancreatitis.   -CT abdomen pelvis although shows no acute abnormalities. Lipase has trended down.  -Continue supportive care with IV fluids, antiemetics, pain control.   - Continue clear liquids but not tolerating it well.  Continue Creon supplements.  Will get gastroenterology consult as patient is not improving.  2.  Intractable nausea/vomiting - x-ray obtained yesterday showing no evidence obstruction or constipation. -Questionable diabetic gastroparesis or possibly to pancreatitis.  Patient is apparently allergic to Reglan.  Placed on scheduled Zofran today.  Will get gastroenterology consult.  Patient may benefit from outpatient gastric emptying study.  Await further GI input.  3. UTI - based off an urinalysis and patient complaining of some dysuria. - Continue ceftriaxone x3 days.  4.  Essential hypertension-continue Norvasc.  5.  Diabetes type 2 with neuropathy- continue sliding scale insulin.  6.  Diabetic neuropathy-continue gabapentin.  7.  GERD-continue Protonix.  8.  Hyperlipidemia-continue atorvastatin.  Discussed plan of care with patient and also gastroenterology.   All the records are reviewed and case discussed with Care Management/Social Worker. Management plans discussed with the patient, family and they are in agreement.  CODE STATUS: Full code  DVT Prophylaxis: Lovenox  TOTAL TIME TAKING CARE OF THIS PATIENT: 30 minutes.   POSSIBLE D/C IN 1-2 DAYS, DEPENDING ON  CLINICAL CONDITION.   Henreitta Leber M.D on 09/28/2018 at 12:56 PM  Between 7am to 6pm - Pager - [phone]  After 6pm go to [email or web address] - Proofreader  [facility]  Office  [phone]  CC: Primary care physician; Novella Rob, FNP

## 2018-09-28 NOTE — Consult Note (Signed)
GI Inpatient Consult Note  Reason for Consult: Epigastric abd pain, intractable nausea/vomiting   Attending Requesting Consult:  History of Present Illness: Karen [name] is a 63 y.o. female seen for evaluation of epigastric abd pain and intractable nausea/vomiting at the request of Dr. Verdell Carmine. Pt was admitted 09/20 for acute on chronic pancreatitis with epigastric abd pain and intractable nausea/vomiting. Her pancreatitis has resolved with CT abd/pelvis showing no inflammatory changes or edema. Her lipase has trended down and is 45 today. She has been put on clear liquids and is tolerating that this afternoon. She still reports 6/10 epigastric abd pain. She reports she has not had any recurrent episodes of vomiting since Sunday. She does continue to endorse moderate nausea. She reports she tried to advance to clear liquids yesterday, but couldn't tolerate it. Abd x-ray showed no evidence of large stool burden or obstruction. She is on Creon supplements for chronic pancreatitis. She denies any dysphagia, odynophagia, or early satiety. She denies any frequent NSAID use. She had upper endoscopy performed by Dr. Marius Ditch 11/2017 2/2 coffee-ground emesis showing H pylori gastritis. She was given triple therapy, but eradication was never confirmed.    Last Colonoscopy: N/A Last Endoscopy:  11/2017 - Normal duodenal bulb and second portion of the duodenum. - Prominent ampulla, cannot r/o ampullary adenoma - Normal stomach. Biopsied. - Normal gastroesophageal junction and esophagus.   Past Medical History:  Past Medical History:  Diagnosis Date  . Alcoholic pancreatitis   . Asthma   . Depression   . Diabetes mellitus without complication ([place])   . DKA (diabetic ketoacidoses) ([place]) 02/17/2015  . Emphysematous cystitis   . Hypercholesteremia   . Hypertension   . Hypokalemia     Problem List: Patient Active Problem List   Diagnosis Date Noted  . Chronic pancreatitis ([place]) 09/25/2018  .  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 Coffee ground emesis   . Upper GI bleed 11/11/2017  . Pancreatitis, acute 01/03/2017  . ETOH abuse 01/03/2017  . UTI (urinary tract infection) 01/03/2017  . Nausea and vomiting   . Esophageal candidiasis ([place])   . Intractable vomiting with nausea   . Hypertensive urgency 04/21/2016  . Abdominal pain   . Acute renal failure ([place])   . Nausea vomiting and diarrhea   . Smoker   . Poorly controlled type 2 diabetes mellitus ([place])   . Acute renal insufficiency 10/29/2015  . Elevated troponin 10/29/2015  . Urinary tract infection without hematuria 10/29/2015  . Left flank pain 10/29/2015  . Malignant essential hypertension 10/29/2015  . DKA (diabetic ketoacidoses) ([place]) 02/17/2015  . Emphysematous cystitis     Past Surgical History: Past Surgical History:  Procedure Laterality Date  . ABDOMINAL HYSTERECTOMY  1996  . APPENDECTOMY  1997  . ESOPHAGOGASTRODUODENOSCOPY N/A 11/12/2017   Procedure: ESOPHAGOGASTRODUODENOSCOPY (EGD);  Surgeon: Lin Landsman, MD;  Location: [facility] ENDOSCOPY;  Service: Gastroenterology;  Laterality: N/A;  . ESOPHAGOGASTRODUODENOSCOPY (EGD) WITH PROPOFOL N/A 04/24/2016   Procedure: ESOPHAGOGASTRODUODENOSCOPY (EGD) WITH PROPOFOL;  Surgeon: [name], MD;  Location: [facility] ENDOSCOPY;  Service: Endoscopy;  Laterality: N/A;  . HAND SURGERY  1988  . THYROID SURGERY  2013    Allergies: Allergies  Allergen Reactions  . Penicillins Anaphylaxis    Has patient had a PCN reaction causing immediate rash, facial/tongue/throat swelling, SOB or lightheadedness with hypotension: Yes Has patient had a PCN reaction causing severe rash involving mucus membranes or skin necrosis: No Has patient had a PCN reaction that required hospitalization No Has  patient had a PCN reaction occurring within the last 10 years: No If all of the above answers are "NO", then may proceed with Cephalosporin use.  . Reglan [Metoclopramide] Other (See  Comments)    Hypotension, shortness of breath  . Percocet [Oxycodone-Acetaminophen] Rash    Home Medications: Medications Prior to Admission  Medication Sig Dispense Refill Last Dose  . amLODipine (NORVASC) 5 MG tablet Take 1 tablet (5 mg total) by mouth daily. 30 tablet 0 09/25/2018 at 0830  . atorvastatin (LIPITOR) 40 MG tablet Take 1 tablet (40 mg total) by mouth at bedtime. 30 tablet 0 09/24/2018 at 2100  . cyclobenzaprine (FLEXERIL) 5 MG tablet Take 1 tablet (5 mg total) by mouth 2 (two) times daily. (Patient taking differently: Take 5 mg by mouth 2 (two) times daily as needed for muscle spasms. ) 30 tablet 0 Unknown at PRN  . flurazepam (DALMANE) 15 MG capsule Take 1 capsule (15 mg total) by mouth at bedtime as needed for sleep. 30 capsule 0 Unknown at PRN  . gabapentin (NEURONTIN) 100 MG capsule Take 100 mg by mouth 2 (two) times daily.   09/25/2018 at 0830  . HYDROcodone-acetaminophen (NORCO/VICODIN) 5-325 MG tablet Take 1 tablet by mouth 4 (four) times daily.   Past Week at Unknown time  . insulin aspart protamine - aspart (NOVOLOG MIX 70/30 FLEXPEN) (70-30) 100 UNIT/ML FlexPen Inject 0.15 mLs (15 Units total) into the skin 2 (two) times daily. 15 mL 0 09/25/2018 at 0830  . lipase/protease/amylase (CREON) 36000 UNITS CPEP capsule Take 1 capsule (36,000 Units total) by mouth 2 (two) times daily with a meal. Take this with snacks up to twice a day. 180 capsule 0 09/25/2018 at 0830  . loperamide (IMODIUM) 2 MG capsule Take 1 capsule (2 mg total) by mouth every 6 (six) hours as needed for diarrhea or loose stools. 10 capsule 0 Unknown at PRN  . omeprazole (PRILOSEC) 40 MG capsule Take 1 capsule (40 mg total) by mouth 2 (two) times daily for 14 days. 28 capsule 0 09/25/2018 at 0830  . ondansetron (ZOFRAN ODT) 4 MG disintegrating tablet Take 1 tablet (4 mg total) by mouth every 8 (eight) hours as needed for nausea or vomiting. 20 tablet 0 Unknown at PRN  . clindamycin (CLEOCIN) 300 MG capsule Take 1  capsule (300 mg total) by mouth 3 (three) times daily. (Patient not taking: Reported on 09/25/2018) 30 capsule 0 Not Taking at Unknown time  . doxycycline (VIBRAMYCIN) 100 MG capsule Take 1 capsule (100 mg total) by mouth 2 (two) times daily. (Patient not taking: Reported on 09/25/2018) 20 capsule 0 Not Taking at Unknown time  . HYDROcodone-acetaminophen (NORCO) 5-325 MG tablet Take 1 tablet by mouth every 4 (four) hours as needed for moderate pain. 4 tablet 0   . lipase/protease/amylase (CREON) 36000 UNITS CPEP capsule Take 2 capsules (72,000 Units total) by mouth 3 (three) times daily with meals. (Patient not taking: Reported on 09/25/2018) 180 capsule 0 Not Taking at Unknown time   Home medication reconciliation was completed with the patient.   Scheduled Inpatient Medications:   . amLODipine  5 mg Oral Daily  . atorvastatin  40 mg Oral QHS  . enoxaparin (LOVENOX) injection  40 mg Subcutaneous Q24H  . gabapentin  100 mg Oral BID  . HYDROcodone-acetaminophen  1 tablet Oral QID  . insulin aspart  0-9 Units Subcutaneous TID WC  . insulin aspart protamine- aspart  6 Units Subcutaneous BID WC  . lipase/protease/amylase  36,000 Units  Oral BID WC  . ondansetron (ZOFRAN) IV  4 mg Intravenous Q6H  . pantoprazole  40 mg Oral Daily  . sodium chloride flush  3 mL Intravenous Once    Continuous Inpatient Infusions:   . sodium chloride 75 mL/hr at 09/28/18 1132  . ciprofloxacin      PRN Inpatient Medications:  albuterol, bisacodyl, cyclobenzaprine, flurazepam, loperamide, morphine injection, sodium phosphate  Family History: Family history is unknown by patient.  The patient's family history is negative for inflammatory bowel disorders, GI malignancy, or solid organ transplantation.  Social History:   reports that she has quit smoking. She has never used smokeless tobacco. She reports previous alcohol use. She reports that she does not use drugs. The patient denies ETOH, tobacco, or drug use.    Review of Systems: Constitutional: Weight is stable.  Eyes: No changes in vision. ENT: No oral lesions, sore throat.  GI: see HPI.  Heme/Lymph: No easy bruising.  CV: No chest pain.  GU: No hematuria.  Integumentary: No rashes.  Neuro: No headaches.  Psych: No depression/anxiety.  Endocrine: No heat/cold intolerance.  Allergic/Immunologic: No urticaria.  Resp: No cough, SOB.  Musculoskeletal: No joint swelling.    Physical Examination: BP 122/78 (BP Location: Right Arm)   Pulse 73   Temp 98.6 F (37 C) (Oral)   Resp 20   Ht 5\' 1"  (1.549 m)   Wt 51.7 kg   SpO2 91%   BMI 21.54 kg/m  Gen: NAD, alert and oriented x 4 HEENT: PEERLA, EOMI, Neck: supple, no JVD or thyromegaly Chest: CTA bilaterally, no wheezes, crackles, or other adventitious sounds CV: RRR, no m/g/c/r Abd: soft, mild tenderness to palpation in epigastric region, ND, +BS in all four quadrants; no HSM, guarding, ridigity, or rebound tenderness Ext: no edema, well perfused with 2+ pulses, Skin: no rash or lesions noted Lymph: no LAD  Data: Lab Results  Component Value Date   WBC 15.1 (H) 09/26/2018   HGB 12.0 09/26/2018   HCT 35.8 (L) 09/26/2018   MCV 83.8 09/26/2018   PLT 215 09/26/2018   Recent Labs  Lab 09/22/18 0702 09/25/18 1138 09/26/18 2036  HGB 12.3 12.1 12.0   Lab Results  Component Value Date   NA 140 09/28/2018   K 3.9 09/28/2018   CL 111 09/28/2018   CO2 23 09/28/2018   BUN 14 09/28/2018   CREATININE 1.23 (H) 09/28/2018   Lab Results  Component Value Date   ALT 72 (H) 09/26/2018   AST 49 (H) 09/26/2018   ALKPHOS 219 (H) 09/26/2018   BILITOT 0.8 09/26/2018   No results for input(s): APTT, INR, PTT in the last 168 hours. Assessment/Plan:  63 y/o female with a PMH of chronic pancreatitis, HTN, and diabetes admitted for acute on chronic pancreatitis  1. Acute on chronic pancreatitis - CT scan negative for any acute inflammatory changes. Lipase is normal. 2. Intractable  nausea and vomiting 3. Epigastric abd pain  -Differential includes diabetic gastroparesis, gastric outlet obstruction, gastritis +/- H pylori, peptic ulcer disease, duodenitis, esophagitis, malignancy -Advise EGD for luminal evaluation. I reviewed the risks (including bleeding, perforation, infection, anesthesia complications, cardiac/respiratory complications), benefits and alternatives of EGD. Patient consents to proceed.  -If EGD if negative, consider further work-up of gastroparesis with gastric emptying scan -Continue scheduled zofran for nausea -NPO after midnight -Plan for EGD tomorrow with Dr. Alice Reichert -Continue acid suppression    Thank you for the consult. Please call with questions or concerns.  Geanie Kenning, PA-C  [facility] [phone] (Cell)

## 2018-09-28 NOTE — H&P (View-Only) (Signed)
GI Inpatient Consult Note  Reason for Consult: Epigastric abd pain, intractable nausea/vomiting   Attending Requesting Consult:  History of Present Illness: Karen [name] is a 63 y.o. female seen for evaluation of epigastric abd pain and intractable nausea/vomiting at the request of Dr. Verdell Carmine. Pt was admitted 09/20 for acute on chronic pancreatitis with epigastric abd pain and intractable nausea/vomiting. Her pancreatitis has resolved with CT abd/pelvis showing no inflammatory changes or edema. Her lipase has trended down and is 45 today. She has been put on clear liquids and is tolerating that this afternoon. She still reports 6/10 epigastric abd pain. She reports she has not had any recurrent episodes of vomiting since Sunday. She does continue to endorse moderate nausea. She reports she tried to advance to clear liquids yesterday, but couldn't tolerate it. Abd x-ray showed no evidence of large stool burden or obstruction. She is on Creon supplements for chronic pancreatitis. She denies any dysphagia, odynophagia, or early satiety. She denies any frequent NSAID use. She had upper endoscopy performed by Dr. Marius Ditch 11/2017 2/2 coffee-ground emesis showing H pylori gastritis. She was given triple therapy, but eradication was never confirmed.    Last Colonoscopy: N/A Last Endoscopy:  11/2017 - Normal duodenal bulb and second portion of the duodenum. - Prominent ampulla, cannot r/o ampullary adenoma - Normal stomach. Biopsied. - Normal gastroesophageal junction and esophagus.   Past Medical History:  Past Medical History:  Diagnosis Date  . Alcoholic pancreatitis   . Asthma   . Depression   . Diabetes mellitus without complication ([place])   . DKA (diabetic ketoacidoses) ([place]) 02/17/2015  . Emphysematous cystitis   . Hypercholesteremia   . Hypertension   . Hypokalemia     Problem List: Patient Active Problem List   Diagnosis Date Noted  . Chronic pancreatitis ([place]) 09/25/2018  .  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 Coffee ground emesis   . Upper GI bleed 11/11/2017  . Pancreatitis, acute 01/03/2017  . ETOH abuse 01/03/2017  . UTI (urinary tract infection) 01/03/2017  . Nausea and vomiting   . Esophageal candidiasis ([place])   . Intractable vomiting with nausea   . Hypertensive urgency 04/21/2016  . Abdominal pain   . Acute renal failure ([place])   . Nausea vomiting and diarrhea   . Smoker   . Poorly controlled type 2 diabetes mellitus ([place])   . Acute renal insufficiency 10/29/2015  . Elevated troponin 10/29/2015  . Urinary tract infection without hematuria 10/29/2015  . Left flank pain 10/29/2015  . Malignant essential hypertension 10/29/2015  . DKA (diabetic ketoacidoses) ([place]) 02/17/2015  . Emphysematous cystitis     Past Surgical History: Past Surgical History:  Procedure Laterality Date  . ABDOMINAL HYSTERECTOMY  1996  . APPENDECTOMY  1997  . ESOPHAGOGASTRODUODENOSCOPY N/A 11/12/2017   Procedure: ESOPHAGOGASTRODUODENOSCOPY (EGD);  Surgeon: Lin Landsman, MD;  Location: [facility] ENDOSCOPY;  Service: Gastroenterology;  Laterality: N/A;  . ESOPHAGOGASTRODUODENOSCOPY (EGD) WITH PROPOFOL N/A 04/24/2016   Procedure: ESOPHAGOGASTRODUODENOSCOPY (EGD) WITH PROPOFOL;  Surgeon: [name], MD;  Location: [facility] ENDOSCOPY;  Service: Endoscopy;  Laterality: N/A;  . HAND SURGERY  1988  . THYROID SURGERY  2013    Allergies: Allergies  Allergen Reactions  . Penicillins Anaphylaxis    Has patient had a PCN reaction causing immediate rash, facial/tongue/throat swelling, SOB or lightheadedness with hypotension: Yes Has patient had a PCN reaction causing severe rash involving mucus membranes or skin necrosis: No Has patient had a PCN reaction that required hospitalization No Has  patient had a PCN reaction occurring within the last 10 years: No If all of the above answers are "NO", then may proceed with Cephalosporin use.  . Reglan [Metoclopramide] Other (See  Comments)    Hypotension, shortness of breath  . Percocet [Oxycodone-Acetaminophen] Rash    Home Medications: Medications Prior to Admission  Medication Sig Dispense Refill Last Dose  . amLODipine (NORVASC) 5 MG tablet Take 1 tablet (5 mg total) by mouth daily. 30 tablet 0 09/25/2018 at 0830  . atorvastatin (LIPITOR) 40 MG tablet Take 1 tablet (40 mg total) by mouth at bedtime. 30 tablet 0 09/24/2018 at 2100  . cyclobenzaprine (FLEXERIL) 5 MG tablet Take 1 tablet (5 mg total) by mouth 2 (two) times daily. (Patient taking differently: Take 5 mg by mouth 2 (two) times daily as needed for muscle spasms. ) 30 tablet 0 Unknown at PRN  . flurazepam (DALMANE) 15 MG capsule Take 1 capsule (15 mg total) by mouth at bedtime as needed for sleep. 30 capsule 0 Unknown at PRN  . gabapentin (NEURONTIN) 100 MG capsule Take 100 mg by mouth 2 (two) times daily.   09/25/2018 at 0830  . HYDROcodone-acetaminophen (NORCO/VICODIN) 5-325 MG tablet Take 1 tablet by mouth 4 (four) times daily.   Past Week at Unknown time  . insulin aspart protamine - aspart (NOVOLOG MIX 70/30 FLEXPEN) (70-30) 100 UNIT/ML FlexPen Inject 0.15 mLs (15 Units total) into the skin 2 (two) times daily. 15 mL 0 09/25/2018 at 0830  . lipase/protease/amylase (CREON) 36000 UNITS CPEP capsule Take 1 capsule (36,000 Units total) by mouth 2 (two) times daily with a meal. Take this with snacks up to twice a day. 180 capsule 0 09/25/2018 at 0830  . loperamide (IMODIUM) 2 MG capsule Take 1 capsule (2 mg total) by mouth every 6 (six) hours as needed for diarrhea or loose stools. 10 capsule 0 Unknown at PRN  . omeprazole (PRILOSEC) 40 MG capsule Take 1 capsule (40 mg total) by mouth 2 (two) times daily for 14 days. 28 capsule 0 09/25/2018 at 0830  . ondansetron (ZOFRAN ODT) 4 MG disintegrating tablet Take 1 tablet (4 mg total) by mouth every 8 (eight) hours as needed for nausea or vomiting. 20 tablet 0 Unknown at PRN  . clindamycin (CLEOCIN) 300 MG capsule Take 1  capsule (300 mg total) by mouth 3 (three) times daily. (Patient not taking: Reported on 09/25/2018) 30 capsule 0 Not Taking at Unknown time  . doxycycline (VIBRAMYCIN) 100 MG capsule Take 1 capsule (100 mg total) by mouth 2 (two) times daily. (Patient not taking: Reported on 09/25/2018) 20 capsule 0 Not Taking at Unknown time  . HYDROcodone-acetaminophen (NORCO) 5-325 MG tablet Take 1 tablet by mouth every 4 (four) hours as needed for moderate pain. 4 tablet 0   . lipase/protease/amylase (CREON) 36000 UNITS CPEP capsule Take 2 capsules (72,000 Units total) by mouth 3 (three) times daily with meals. (Patient not taking: Reported on 09/25/2018) 180 capsule 0 Not Taking at Unknown time   Home medication reconciliation was completed with the patient.   Scheduled Inpatient Medications:   . amLODipine  5 mg Oral Daily  . atorvastatin  40 mg Oral QHS  . enoxaparin (LOVENOX) injection  40 mg Subcutaneous Q24H  . gabapentin  100 mg Oral BID  . HYDROcodone-acetaminophen  1 tablet Oral QID  . insulin aspart  0-9 Units Subcutaneous TID WC  . insulin aspart protamine- aspart  6 Units Subcutaneous BID WC  . lipase/protease/amylase  36,000 Units  Oral BID WC  . ondansetron (ZOFRAN) IV  4 mg Intravenous Q6H  . pantoprazole  40 mg Oral Daily  . sodium chloride flush  3 mL Intravenous Once    Continuous Inpatient Infusions:   . sodium chloride 75 mL/hr at 09/28/18 1132  . ciprofloxacin      PRN Inpatient Medications:  albuterol, bisacodyl, cyclobenzaprine, flurazepam, loperamide, morphine injection, sodium phosphate  Family History: Family history is unknown by patient.  The patient's family history is negative for inflammatory bowel disorders, GI malignancy, or solid organ transplantation.  Social History:   reports that she has quit smoking. She has never used smokeless tobacco. She reports previous alcohol use. She reports that she does not use drugs. The patient denies ETOH, tobacco, or drug use.    Review of Systems: Constitutional: Weight is stable.  Eyes: No changes in vision. ENT: No oral lesions, sore throat.  GI: see HPI.  Heme/Lymph: No easy bruising.  CV: No chest pain.  GU: No hematuria.  Integumentary: No rashes.  Neuro: No headaches.  Psych: No depression/anxiety.  Endocrine: No heat/cold intolerance.  Allergic/Immunologic: No urticaria.  Resp: No cough, SOB.  Musculoskeletal: No joint swelling.    Physical Examination: BP 122/78 (BP Location: Right Arm)   Pulse 73   Temp 98.6 F (37 C) (Oral)   Resp 20   Ht 5\' 1"  (1.549 m)   Wt 51.7 kg   SpO2 91%   BMI 21.54 kg/m  Gen: NAD, alert and oriented x 4 HEENT: PEERLA, EOMI, Neck: supple, no JVD or thyromegaly Chest: CTA bilaterally, no wheezes, crackles, or other adventitious sounds CV: RRR, no m/g/c/r Abd: soft, mild tenderness to palpation in epigastric region, ND, +BS in all four quadrants; no HSM, guarding, ridigity, or rebound tenderness Ext: no edema, well perfused with 2+ pulses, Skin: no rash or lesions noted Lymph: no LAD  Data: Lab Results  Component Value Date   WBC 15.1 (H) 09/26/2018   HGB 12.0 09/26/2018   HCT 35.8 (L) 09/26/2018   MCV 83.8 09/26/2018   PLT 215 09/26/2018   Recent Labs  Lab 09/22/18 0702 09/25/18 1138 09/26/18 2036  HGB 12.3 12.1 12.0   Lab Results  Component Value Date   NA 140 09/28/2018   K 3.9 09/28/2018   CL 111 09/28/2018   CO2 23 09/28/2018   BUN 14 09/28/2018   CREATININE 1.23 (H) 09/28/2018   Lab Results  Component Value Date   ALT 72 (H) 09/26/2018   AST 49 (H) 09/26/2018   ALKPHOS 219 (H) 09/26/2018   BILITOT 0.8 09/26/2018   No results for input(s): APTT, INR, PTT in the last 168 hours. Assessment/Plan:  63 y/o female with a PMH of chronic pancreatitis, HTN, and diabetes admitted for acute on chronic pancreatitis  1. Acute on chronic pancreatitis - CT scan negative for any acute inflammatory changes. Lipase is normal. 2. Intractable  nausea and vomiting 3. Epigastric abd pain  -Differential includes diabetic gastroparesis, gastric outlet obstruction, gastritis +/- H pylori, peptic ulcer disease, duodenitis, esophagitis, malignancy -Advise EGD for luminal evaluation. I reviewed the risks (including bleeding, perforation, infection, anesthesia complications, cardiac/respiratory complications), benefits and alternatives of EGD. Patient consents to proceed.  -If EGD if negative, consider further work-up of gastroparesis with gastric emptying scan -Continue scheduled zofran for nausea -NPO after midnight -Plan for EGD tomorrow with Dr. Alice Reichert -Continue acid suppression    Thank you for the consult. Please call with questions or concerns.  Geanie Kenning, PA-C  [facility] [phone] (Cell)

## 2018-09-28 NOTE — Progress Notes (Signed)
PHARMACY NOTE:  ANTIMICROBIAL RENAL DOSAGE ADJUSTMENT  Current antimicrobial regimen includes a mismatch between antimicrobial dosage and estimated renal function.  As per policy approved by the Pharmacy & Therapeutics and Medical Executive Committees, the antimicrobial dosage will be adjusted accordingly.  Current antimicrobial dosage:  Cipro 200mg  IV q12h  Indication: UTI  Renal Function: Now > 30 ml/min  Estimated Creatinine Clearance: 36.2 mL/min (A) (by C-G formula based on SCr of 1.23 mg/dL (H)).     Antimicrobial dosage has been changed to:  Cipro 400mg  IV q12  Additional comments:   Thank you for allowing pharmacy to be a part of this patient's care.  Lu Duffel, PharmD, BCPS Clinical Pharmacist 09/28/2018 11:49 AM

## 2018-09-29 ENCOUNTER — Encounter: Payer: Self-pay | Admitting: *Deleted

## 2018-09-29 ENCOUNTER — Encounter
Admission: EM | Disposition: A | Discharge status: Home / Self Care | Payer: Self-pay | Source: Home / Self Care | Attending: Specialist

## 2018-09-29 ENCOUNTER — Inpatient Hospital Stay: Payer: Medicaid Other | Admitting: Anesthesiology

## 2018-09-29 HISTORY — PX: ESOPHAGOGASTRODUODENOSCOPY: SHX5428

## 2018-09-29 LAB — COMPREHENSIVE METABOLIC PANEL
ALT: 54 U/L — ABNORMAL HIGH (ref 0–44)
AST: 32 U/L (ref 15–41)
Albumin: 2.9 g/dL — ABNORMAL LOW (ref 3.5–5.0)
Alkaline Phosphatase: 231 U/L — ABNORMAL HIGH (ref 38–126)
Anion gap: 6 (ref 5–15)
BUN: 10 mg/dL (ref 8–23)
CO2: 24 mmol/L (ref 22–32)
Calcium: 8.4 mg/dL — ABNORMAL LOW (ref 8.9–10.3)
Chloride: 111 mmol/L (ref 98–111)
Creatinine, Ser: 1.19 mg/dL — ABNORMAL HIGH (ref 0.44–1.00)
GFR calc Af Amer: 57 mL/min — ABNORMAL LOW (ref >60)
GFR calc non Af Amer: 49 mL/min — ABNORMAL LOW (ref >60)
Glucose, Bld: 149 mg/dL — ABNORMAL HIGH (ref 70–99)
Potassium: 3.7 mmol/L (ref 3.5–5.1)
Sodium: 141 mmol/L (ref 135–145)
Total Bilirubin: 0.7 mg/dL (ref 0.3–1.2)
Total Protein: 6.2 g/dL — ABNORMAL LOW (ref 6.5–8.1)

## 2018-09-29 LAB — GLUCOSE, CAPILLARY
Glucose-Capillary: 202 mg/dL — ABNORMAL HIGH (ref 70–99)
Glucose-Capillary: 127 mg/dL — ABNORMAL HIGH (ref 70–99)
Glucose-Capillary: 144 mg/dL — ABNORMAL HIGH (ref 70–99)
Glucose-Capillary: 157 mg/dL — ABNORMAL HIGH (ref 70–99)

## 2018-09-29 LAB — LIPASE, BLOOD: Lipase: 43 U/L (ref 11–51)

## 2018-09-29 SURGERY — EGD (ESOPHAGOGASTRODUODENOSCOPY)
Anesthesia: General

## 2018-09-29 MED ORDER — CIPROFLOXACIN HCL 250 MG PO TABS: 250.0000 mg | ORAL_TABLET | Freq: Two times a day (BID) | ORAL | 0 refills | Status: AC

## 2018-09-29 MED ORDER — PROPOFOL 10 MG/ML IV BOLUS
INTRAVENOUS | Status: AC
  Filled 2018-09-29: qty 40

## 2018-09-29 MED ORDER — PROPOFOL 500 MG/50ML IV EMUL
INTRAVENOUS | Status: DC | PRN
  Administered 2018-09-29: 100 ug/kg/min via INTRAVENOUS

## 2018-09-29 MED ORDER — HYDROCODONE-ACETAMINOPHEN 5-325 MG PO TABS: 1.0000 | ORAL_TABLET | Freq: Four times a day (QID) | ORAL | 0 refills | Status: DC

## 2018-09-29 MED ORDER — PROPOFOL 10 MG/ML IV BOLUS
INTRAVENOUS | Status: DC | PRN
  Administered 2018-09-29: 30 mg via INTRAVENOUS
  Administered 2018-09-29 (×2): 20 mg via INTRAVENOUS

## 2018-09-29 MED ORDER — LIDOCAINE HCL (CARDIAC) PF 100 MG/5ML IV SOSY
PREFILLED_SYRINGE | INTRAVENOUS | Status: DC | PRN
  Administered 2018-09-29: 50 mg via INTRAVENOUS

## 2018-09-29 MED ORDER — SODIUM CHLORIDE 0.9 % IV SOLN
INTRAVENOUS | Status: DC
  Administered 2018-09-29: 15:00:00 via INTRAVENOUS

## 2018-09-29 NOTE — OR Nursing (Signed)
REPORT TO MOLLY ,RN ON FLOOR. EGD WNL. . PT TO BE TRANSPORTED BACK TO FLOOR

## 2018-09-29 NOTE — Anesthesia Preprocedure Evaluation (Addendum)
Anesthesia Evaluation  Patient identified by MRN, date of birth, ID band Patient awake    Reviewed: Allergy & Precautions, NPO status , Patient's Chart, lab work & pertinent test results  History of Anesthesia Complications Negative for: history of anesthetic complications  Airway Mallampati: II       Dental  (+) Upper Dentures   Pulmonary neg sleep apnea, neg COPD, Not current smoker, former smoker,           Cardiovascular hypertension, Pt. on medications (-) Past MI and (-) CHF (-) dysrhythmias (-) Valvular Problems/Murmurs     Neuro/Psych neg Seizures Depression    GI/Hepatic Neg liver ROS, GERD  ,  Endo/Other  diabetes, Type 2, Oral Hypoglycemic Agents  Renal/GU negative Renal ROS     Musculoskeletal   Abdominal   Peds  Hematology   Anesthesia Other Findings   Reproductive/Obstetrics                            Anesthesia Physical Anesthesia Plan  ASA: II  Anesthesia Plan: General   Post-op Pain Management:    Induction:   PONV Risk Score and Plan: 3 and Propofol infusion, TIVA and Ondansetron  Airway Management Planned: Nasal Cannula  Additional Equipment:   Intra-op Plan:   Post-operative Plan:   Informed Consent: I have reviewed the patients History and Physical, chart, labs and discussed the procedure including the risks, benefits and alternatives for the proposed anesthesia with the patient or authorized representative who has indicated his/her understanding and acceptance.       Plan Discussed with:   Anesthesia Plan Comments:         Anesthesia Quick Evaluation

## 2018-09-29 NOTE — Anesthesia Post-op Follow-up Note (Signed)
Anesthesia QCDR form completed.        

## 2018-09-29 NOTE — Op Note (Signed)
[facility] Gastroenterology Patient Name: Karen [name] Procedure Date: 09/29/2018 3:00 PM MRN: [id number] Account #: [id number] Date of Birth: 31-Oct-1955 Admit Type: Inpatient Age: 63 Room: [facility] ENDO ROOM 1 Gender: Female Note Status: Finalized Procedure:            Upper GI endoscopy Indications:          Epigastric abdominal pain, Nausea with vomiting Providers:            Benay Pike. Alice Reichert MD, MD Referring MD:         Olen Pel, FNP Medicines:            Propofol per Anesthesia Complications:        No immediate complications. Procedure:            Pre-Anesthesia Assessment:                       - The risks and benefits of the procedure and the                        sedation options and risks were discussed with the                        patient. All questions were answered and informed                        consent was obtained.                       - Patient identification and proposed procedure were                        verified prior to the procedure by the nurse. The                        procedure was verified in the procedure room.                       - ASA Grade Assessment: III - A patient with severe                        systemic disease.                       - After reviewing the risks and benefits, the patient                        was deemed in satisfactory condition to undergo the                        procedure.                       After obtaining informed consent, the endoscope was                        passed under direct vision. Throughout the procedure,                        the patient's blood pressure, pulse, and oxygen  saturations were monitored continuously. The Endoscope                        was introduced through the mouth, and advanced to the                        third part of duodenum. The upper GI endoscopy was                        accomplished without difficulty. The patient  tolerated                        the procedure well. Findings:      The esophagus was normal.      The stomach was normal.      The examined duodenum was normal. Impression:           - Normal esophagus.                       - Normal stomach.                       - Normal examined duodenum.                       - No specimens collected. Recommendation:       - Patient has a contact number available for                        emergencies. The signs and symptoms of potential                        delayed complications were discussed with the patient.                        Return to normal activities tomorrow. Written discharge                        instructions were provided to the patient.                       - Resume previous diet.                       - Continue present medications.                       - Return patient to hospital ward for possible                        discharge same day.                       - GI sign off. Call back if needed Procedure Code(s):    --- Professional ---                       [phone], Esophagogastroduodenoscopy, flexible, transoral;                        diagnostic, including collection of specimen(s) by  brushing or washing, when performed (separate procedure) Diagnosis Code(s):    --- Professional ---                       R11.2, Nausea with vomiting, unspecified                       R10.13, Epigastric pain CPT copyright 2019 American Medical Association. All rights reserved. The codes documented in this report are preliminary and upon coder review may  be revised to meet current compliance requirements. Efrain Sella MD, MD 09/29/2018 3:21:41 PM This report has been signed electronically. Number of Addenda: 0 Note Initiated On: 09/29/2018 3:00 PM Estimated Blood Loss: Estimated blood loss: none.      [facility]

## 2018-09-29 NOTE — Transfer of Care (Signed)
Immediate Anesthesia Transfer of Care Note  Patient: Karen [name]  Procedure(s) Performed: ESOPHAGOGASTRODUODENOSCOPY (EGD) (N/A )  Patient Location: PACU and Endoscopy Unit  Anesthesia Type:General  Level of Consciousness: awake  Airway & Oxygen Therapy: Patient Spontanous Breathing and Patient connected to nasal cannula oxygen  Post-op Assessment: Report given to RN and Post -op Vital signs reviewed and stable  Post vital signs: stable  Last Vitals:  Vitals Value Taken Time  BP 123/63 09/29/18 1519  Temp    Pulse 65 09/29/18 1520  Resp 21 09/29/18 1520  SpO2 100 % 09/29/18 1520  Vitals shown include unvalidated device data.  Last Pain:  Vitals:   09/29/18 1519  TempSrc: (P) Tympanic  PainSc:       Patients Stated Pain Goal: 0 ([phone] [id number])  Complications: No apparent anesthesia complications

## 2018-09-29 NOTE — Progress Notes (Signed)
Karen [name] to be D/C'd home per MD order.  Discussed prescriptions and follow up appointments with the patient. Prescriptions given to patient, medication list explained in detail. Pt verbalized understanding.  Allergies as of 09/29/2018      Reactions   Penicillins Anaphylaxis   Has patient had a PCN reaction causing immediate rash, facial/tongue/throat swelling, SOB or lightheadedness with hypotension: Yes Has patient had a PCN reaction causing severe rash involving mucus membranes or skin necrosis: No Has patient had a PCN reaction that required hospitalization No Has patient had a PCN reaction occurring within the last 10 years: No If all of the above answers are "NO", then may proceed with Cephalosporin use.   Reglan [metoclopramide] Other (See Comments)   Hypotension, shortness of breath   Percocet [oxycodone-acetaminophen] Rash      Medication List    STOP taking these medications   clindamycin 300 MG capsule Commonly known as: CLEOCIN   doxycycline 100 MG capsule Commonly known as: VIBRAMYCIN     TAKE these medications   amLODipine 5 MG tablet Commonly known as: NORVASC Take 1 tablet (5 mg total) by mouth daily.   atorvastatin 40 MG tablet Commonly known as: LIPITOR Take 1 tablet (40 mg total) by mouth at bedtime.   ciprofloxacin 250 MG tablet Commonly known as: Cipro Take 1 tablet (250 mg total) by mouth 2 (two) times daily for 3 days.   cyclobenzaprine 5 MG tablet Commonly known as: FLEXERIL Take 1 tablet (5 mg total) by mouth 2 (two) times daily. What changed:   when to take this  reasons to take this   flurazepam 15 MG capsule Commonly known as: DALMANE Take 1 capsule (15 mg total) by mouth at bedtime as needed for sleep.   gabapentin 100 MG capsule Commonly known as: NEURONTIN Take 100 mg by mouth 2 (two) times daily.   HYDROcodone-acetaminophen 5-325 MG tablet Commonly known as: NORCO/VICODIN Take 1 tablet by mouth 4 (four) times daily. What  changed: Another medication with the same name was removed. Continue taking this medication, and follow the directions you see here.   lipase/protease/amylase 36000 UNITS Cpep capsule Commonly known as: Creon Take 1 capsule (36,000 Units total) by mouth 2 (two) times daily with a meal. Take this with snacks up to twice a day. What changed: Another medication with the same name was removed. Continue taking this medication, and follow the directions you see here.   loperamide 2 MG capsule Commonly known as: IMODIUM Take 1 capsule (2 mg total) by mouth every 6 (six) hours as needed for diarrhea or loose stools.   NovoLOG Mix 70/30 FlexPen (70-30) 100 UNIT/ML FlexPen Generic drug: insulin aspart protamine - aspart Inject 0.15 mLs (15 Units total) into the skin 2 (two) times daily.   omeprazole 40 MG capsule Commonly known as: PRILOSEC Take 1 capsule (40 mg total) by mouth 2 (two) times daily for 14 days.   ondansetron 4 MG disintegrating tablet Commonly known as: Zofran ODT Take 1 tablet (4 mg total) by mouth every 8 (eight) hours as needed for nausea or vomiting.       Vitals:   09/29/18 1539 09/29/18 1549  BP: (!) 148/74 (!) 148/73  Pulse:    Resp:    Temp:    SpO2:      Skin clean, dry and intact without evidence of skin break down, no evidence of skin tears noted. IV catheter discontinued intact. Site without signs and symptoms of complications. Dressing and pressure  applied. Pt denies pain at this time. No complaints noted.  An After Visit Summary was printed and given to the patient. Patient escorted via [place], and D/C home via private auto.  Chuck Hint RN [facility] [employer]

## 2018-09-29 NOTE — Progress Notes (Signed)
[facility] [facility] [place] NAME: Karen [name]    MR#:  [id number]  DATE OF BIRTH:  09-Feb-1955  SUBJECTIVE:   Patient still continues to complain of some mild epigastric pain and intermittent nausea.  Seen by gastroenterology yesterday and plan for upper GI endoscopy today.  REVIEW OF SYSTEMS:    Review of Systems  Constitutional: Negative for chills and fever.  HENT: Negative for congestion and tinnitus.   Eyes: Negative for blurred vision and double vision.  Respiratory: Negative for cough, shortness of breath and wheezing.   Cardiovascular: Negative for chest pain, orthopnea and PND.  Gastrointestinal: Positive for abdominal pain and nausea. Negative for diarrhea and vomiting.  Genitourinary: Negative for dysuria and hematuria.  Neurological: Negative for dizziness, sensory change and focal weakness.  All other systems reviewed and are negative.   Nutrition: NPO for Endoscopy  Tolerating Diet: No Tolerating PT:  Ambulatory   DRUG ALLERGIES:   Allergies  Allergen Reactions  . Penicillins Anaphylaxis    Has patient had a PCN reaction causing immediate rash, facial/tongue/throat swelling, SOB or lightheadedness with hypotension: Yes Has patient had a PCN reaction causing severe rash involving mucus membranes or skin necrosis: No Has patient had a PCN reaction that required hospitalization No Has patient had a PCN reaction occurring within the last 10 years: No If all of the above answers are "NO", then may proceed with Cephalosporin use.  . Reglan [Metoclopramide] Other (See Comments)    Hypotension, shortness of breath  . Percocet [Oxycodone-Acetaminophen] Rash    VITALS:  Blood pressure (!) 144/73, pulse 75, temperature 98.9 F (37.2 C), temperature source Oral, resp. rate 18, height 5\' 1"  (1.549 m), weight 51.7 kg, SpO2 100 %.  PHYSICAL EXAMINATION:   Physical Exam  GENERAL:  63 y.o.-year-old patient lying in bed in no acute  distress.  EYES: Pupils equal, round, reactive to light and accommodation. No scleral icterus. Extraocular muscles intact.  HEENT: Head atraumatic, normocephalic. Oropharynx and nasopharynx clear.  NECK:  Supple, no jugular venous distention. No thyroid enlargement, no tenderness.  LUNGS: Normal breath sounds bilaterally, no wheezing, rales, rhonchi. No use of accessory muscles of respiration.  CARDIOVASCULAR: S1, S2 normal. No murmurs, rubs, or gallops.  ABDOMEN: Soft, Epigastric abdominal pain, slightly distended, No rebound, rigidity, nondistended. Bowel sounds present. No organomegaly or mass.  EXTREMITIES: No cyanosis, clubbing or edema b/l.    NEUROLOGIC: Cranial nerves II through XII are intact. No focal Motor or sensory deficits b/l.   PSYCHIATRIC: The patient is alert and oriented x 3.  SKIN: No obvious rash, lesion, or ulcer.    LABORATORY PANEL:   CBC Recent Labs  Lab 09/26/18 2036  WBC 15.1*  HGB 12.0  HCT 35.8*  PLT 215   ------------------------------------------------------------------------------------------------------------------  Chemistries  Recent Labs  Lab 09/29/18 0422  NA 141  K 3.7  CL 111  CO2 24  GLUCOSE 149*  BUN 10  CREATININE 1.19*  CALCIUM 8.4*  AST 32  ALT 54*  ALKPHOS 231*  BILITOT 0.7   ------------------------------------------------------------------------------------------------------------------  Cardiac Enzymes No results for input(s): TROPONINI in the last 168 hours. ------------------------------------------------------------------------------------------------------------------  RADIOLOGY:  No results found.   ASSESSMENT AND PLAN:   63 yo female w/ hx of HTN, Hyperlipidemia, DM, previous hx of DKA, Depression, hx of alcoholic pancreatitis who presented to the hospital due to abdominal pain nausea or vomiting.   1.  Acute pancreatitis-this is likely acute on chronic pancreatitis.   -  CT abdomen pelvis although shows no  acute abnormalities. Lipase has trended down.  -Continue supportive care with IV fluids, antiemetics, pain control.   - Continue clear liquids but not tolerating it well.  Continue Creon supplements.   2.  Intractable nausea/vomiting - x-ray obtained 2 days ago showing no evidence obstruction or constipation. -Questionable diabetic gastroparesis or possibly to pancreatitis.  Patient is apparently allergic to Reglan.   -Continue scheduled Zofran, GI consulting yesterday and planning for upper GI endoscopy today.  If endoscopy is negative consider doing a gastric emptying study as outpatient.  3. UTI - based off an urinalysis and patient complaining of some dysuria. - Continue ceftriaxone x3 days.  4.  Essential hypertension-continue Norvasc.  5.  Diabetes type 2 with neuropathy- continue sliding scale insulin. BS stable.   6.  Diabetic neuropathy-continue gabapentin.  7.  GERD-continue Protonix.  8.  Hyperlipidemia-continue atorvastatin.   All the records are reviewed and case discussed with Care Management/Social Worker. Management plans discussed with the patient, family and they are in agreement.  CODE STATUS: Full code  DVT Prophylaxis: Lovenox  TOTAL TIME TAKING CARE OF THIS PATIENT: 25 minutes.   POSSIBLE D/C IN 1-2 DAYS, DEPENDING ON CLINICAL CONDITION.   Henreitta Leber M.D on 09/29/2018 at 2:40 PM  Between 7am to 6pm - Pager - [phone]  After 6pm go to [email or web address] - Proofreader  [facility]  Office  [phone]  CC: Primary care physician; Novella Rob, FNP

## 2018-09-29 NOTE — Interval H&P Note (Signed)
History and Physical Interval Note:  09/29/2018 2:25 PM  Karen [name]  has presented today for surgery, with the diagnosis of Intractable nausea and vomiting, epigastric abd pain.  The various methods of treatment have been discussed with the patient and family. After consideration of risks, benefits and other options for treatment, the patient has consented to  Procedure(s): ESOPHAGOGASTRODUODENOSCOPY (EGD) (N/A) as a surgical intervention.  The patient's history has been reviewed, patient examined, no change in status, stable for surgery.  I have reviewed the patient's chart and labs.  Questions were answered to the patient's satisfaction.     [place], [place]

## 2018-09-29 NOTE — Care Management (Signed)
RNCM consult for medication needs  Patient denies any issues obtaining her medications  Patient states that she receives pain medication from her PCP.  On 09/16/18 while walking to the food bank she lost her pocket book.  Patient states that she reported her pocket book missing to the police.  PCP declined to write for additional pain medication  Due to patient's admission MD has written for 15 Norco at discharge

## 2018-10-01 LAB — CULTURE, BLOOD (ROUTINE X 2)
Culture: NO GROWTH
Culture: NO GROWTH
Special Requests: ADEQUATE
Special Requests: ADEQUATE

## 2018-10-03 ENCOUNTER — Other Ambulatory Visit: Payer: Self-pay

## 2018-10-03 DIAGNOSIS — Z20822 Contact with and (suspected) exposure to covid-19: Secondary | ICD-10-CM

## 2018-10-04 LAB — NOVEL CORONAVIRUS, NAA: SARS-CoV-2, NAA: NOT DETECTED

## 2018-10-10 NOTE — Discharge Summary (Signed)
[facility] at [place] NAME: Karen [name]    MR#:  [id number]  DATE OF BIRTH:  [birth year]  DATE OF ADMISSION:  09/25/2018 ADMITTING PHYSICIAN: Demetrios Loll, MD  DATE OF DISCHARGE: 09/29/2018  7:47 PM  PRIMARY CARE PHYSICIAN: Novella Rob, FNP    ADMISSION DIAGNOSIS:  Acute pancreatitis, unspecified complication status, unspecified pancreatitis type [K85.90]  DISCHARGE DIAGNOSIS:  Active Problems:   Chronic pancreatitis ([place])   SECONDARY DIAGNOSIS:   Past Medical History:  Diagnosis Date  . Alcoholic pancreatitis   . Asthma   . Depression   . Diabetes mellitus without complication ([place])   . DKA (diabetic ketoacidoses) ([place]) 02/17/2015  . Emphysematous cystitis   . Hypercholesteremia   . Hypertension   . Hypokalemia     HOSPITAL COURSE:   63 yo female w/ hx of HTN, Hyperlipidemia, DM, previous hx of DKA, Depression, hx of alcoholic pancreatitis who presented to the hospital due to abdominal pain nausea or vomiting.   1.  Acute pancreatitis-this was acute on chronic pancreatitis.   -CT abdomen pelvis although showed no acute abnormalities.  -Patient was treated supportively with IV fluids, antiemetics and pain control.  She was also given her Creon supplements. - With supportive care her clinical symptoms have improved.  Her diet has been slowly advanced more clear to a regular soft diet which she is tolerating without any exacerbation of her symptoms.  She is therefore being discharged home.  2.  Intractable nausea/vomiting - patient developed this while in the hospital. -She was treated supportively with IV fluids, antiemetics.  A gastroenterology consult was obtained as she was not improving and patient underwent an upper GI endoscopy which showed no acute pathology or any evidence of bleeding. -GI recommended outpatient follow-up and outpatient gastric emptying study.    3. UTI - based off an urinalysis as patient complaining of  some dysuria. -Patient was treated with IV ceftriaxone while in the hospital and discharged on oral Ceftin for a few days.  4.  Essential hypertension- she will continue Norvasc.  5.  Diabetes type 2 with neuropathy-  patient was in the hospital maintained on sliding scale insulin and now being discharged back on her Novolin 70/30.  6.  Diabetic neuropathy- pt. Will cont. continue gabapentin.  7.  GERD- pt. Will continue Protonix.  8.  Hyperlipidemia- pt. Will continue atorvastatin.  DISCHARGE CONDITIONS:   Stable.   CONSULTS OBTAINED:    DRUG ALLERGIES:   Allergies  Allergen Reactions  . Penicillins Anaphylaxis    Has patient had a PCN reaction causing immediate rash, facial/tongue/throat swelling, SOB or lightheadedness with hypotension: Yes Has patient had a PCN reaction causing severe rash involving mucus membranes or skin necrosis: No Has patient had a PCN reaction that required hospitalization No Has patient had a PCN reaction occurring within the last 10 years: No If all of the above answers are "NO", then may proceed with Cephalosporin use.  . Reglan [Metoclopramide] Other (See Comments)    Hypotension, shortness of breath  . Percocet [Oxycodone-Acetaminophen] Rash    DISCHARGE MEDICATIONS:   Allergies as of 09/29/2018      Reactions   Penicillins Anaphylaxis   Has patient had a PCN reaction causing immediate rash, facial/tongue/throat swelling, SOB or lightheadedness with hypotension: Yes Has patient had a PCN reaction causing severe rash involving mucus membranes or skin necrosis: No Has patient had a PCN reaction that required hospitalization No Has patient had a PCN reaction  occurring within the last 10 years: No If all of the above answers are "NO", then may proceed with Cephalosporin use.   Reglan [metoclopramide] Other (See Comments)   Hypotension, shortness of breath   Percocet [oxycodone-acetaminophen] Rash      Medication List    STOP taking  these medications   clindamycin 300 MG capsule Commonly known as: CLEOCIN   doxycycline 100 MG capsule Commonly known as: VIBRAMYCIN     TAKE these medications   amLODipine 5 MG tablet Commonly known as: NORVASC Take 1 tablet (5 mg total) by mouth daily.   atorvastatin 40 MG tablet Commonly known as: LIPITOR Take 1 tablet (40 mg total) by mouth at bedtime.   cyclobenzaprine 5 MG tablet Commonly known as: FLEXERIL Take 1 tablet (5 mg total) by mouth 2 (two) times daily. What changed:   when to take this  reasons to take this   flurazepam 15 MG capsule Commonly known as: DALMANE Take 1 capsule (15 mg total) by mouth at bedtime as needed for sleep.   gabapentin 100 MG capsule Commonly known as: NEURONTIN Take 100 mg by mouth 2 (two) times daily.   HYDROcodone-acetaminophen 5-325 MG tablet Commonly known as: NORCO/VICODIN Take 1 tablet by mouth 4 (four) times daily. What changed: Another medication with the same name was removed. Continue taking this medication, and follow the directions you see here.   lipase/protease/amylase 36000 UNITS Cpep capsule Commonly known as: Creon Take 1 capsule (36,000 Units total) by mouth 2 (two) times daily with a meal. Take this with snacks up to twice a day. What changed: Another medication with the same name was removed. Continue taking this medication, and follow the directions you see here.   loperamide 2 MG capsule Commonly known as: IMODIUM Take 1 capsule (2 mg total) by mouth every 6 (six) hours as needed for diarrhea or loose stools.   NovoLOG Mix 70/30 FlexPen (70-30) 100 UNIT/ML FlexPen Generic drug: insulin aspart protamine - aspart Inject 0.15 mLs (15 Units total) into the skin 2 (two) times daily.   omeprazole 40 MG capsule Commonly known as: PRILOSEC Take 1 capsule (40 mg total) by mouth 2 (two) times daily for 14 days.   ondansetron 4 MG disintegrating tablet Commonly known as: Zofran ODT Take 1 tablet (4 mg total)  by mouth every 8 (eight) hours as needed for nausea or vomiting.     ASK your doctor about these medications   ciprofloxacin 250 MG tablet Commonly known as: Cipro Take 1 tablet (250 mg total) by mouth 2 (two) times daily for 3 days. Ask about: Should I take this medication?         DISCHARGE INSTRUCTIONS:   DIET:  Cardiac diet and Diabetic diet  DISCHARGE CONDITION:  Stable  ACTIVITY:  Activity as tolerated  OXYGEN:  Home Oxygen: No.   Oxygen Delivery: room air  DISCHARGE LOCATION:  home   If you experience worsening of your admission symptoms, develop shortness of breath, life threatening emergency, suicidal or homicidal thoughts you must seek medical attention immediately by calling 911 or calling your MD immediately  if symptoms less severe.  You Must read complete instructions/literature along with all the possible adverse reactions/side effects for all the Medicines you take and that have been prescribed to you. Take any new Medicines after you have completely understood and accpet all the possible adverse reactions/side effects.   Please note  You were cared for by a hospitalist during your hospital stay. If you  have any questions about your discharge medications or the care you received while you were in the hospital after you are discharged, you can call the unit and asked to speak with the hospitalist on call if the hospitalist that took care of you is not available. Once you are discharged, your primary care physician will handle any further medical issues. Please note that NO REFILLS for any discharge medications will be authorized once you are discharged, as it is imperative that you return to your primary care physician (or establish a relationship with a primary care physician if you do not have one) for your aftercare needs so that they can reassess your need for medications and monitor your lab values.     DATA REVIEW:   CBC No results for input(s):  WBC, HGB, HCT, PLT in the last 168 hours.  Chemistries  No results for input(s): NA, K, CL, CO2, GLUCOSE, BUN, CREATININE, CALCIUM, MG, AST, ALT, ALKPHOS, BILITOT in the last 168 hours.  Invalid input(s): GFRCGP  Cardiac Enzymes No results for input(s): TROPONINI in the last 168 hours.  Microbiology Results  Results for orders placed or performed during the hospital encounter of 09/25/18  SARS CORONAVIRUS 2 (TAT 6-24 HRS) Nasopharyngeal Nasopharyngeal Swab     Status: None   Collection Time: 09/25/18  1:59 PM   Specimen: Nasopharyngeal Swab  Result Value Ref Range Status   SARS Coronavirus 2 NEGATIVE NEGATIVE Final    Comment: (NOTE) SARS-CoV-2 target nucleic acids are NOT DETECTED. The SARS-CoV-2 RNA is generally detectable in upper and lower respiratory specimens during the acute phase of infection. Negative results do not preclude SARS-CoV-2 infection, do not rule out co-infections with other pathogens, and should not be used as the sole basis for treatment or other patient management decisions. Negative results must be combined with clinical observations, patient history, and epidemiological information. The expected result is Negative. Fact Sheet for Patients: SugarRoll.be Fact Sheet for Healthcare Providers: https://www.woods-mathews.com/ This test is not yet approved or cleared by the [place] FDA and  has been authorized for detection and/or diagnosis of SARS-CoV-2 by FDA under an Emergency Use Authorization (EUA). This EUA will remain  in effect (meaning this test can be used) for the duration of the COVID-19 declaration under Section 56 4(b)(1) of the Act, 21 U.S.C. section 360bbb-3(b)(1), unless the authorization is terminated or revoked sooner. Performed at [facility], [place] [address] [place], [place] [address]   Culture, blood (Routine X 2) w Reflex to ID Panel     Status: None   Collection Time: 09/26/18   9:22 PM   Specimen: BLOOD  Result Value Ref Range Status   Specimen Description BLOOD LEFT ANTECUBITAL  Final   Special Requests   Final    BOTTLES DRAWN AEROBIC AND ANAEROBIC Blood Culture adequate volume   Culture   Final    NO GROWTH 5 DAYS Performed at [facility], [address] [place], [place] [address]    Report Status 10/01/2018 FINAL  Final  Culture, blood (Routine X 2) w Reflex to ID Panel     Status: None   Collection Time: 09/26/18  9:28 PM   Specimen: BLOOD  Result Value Ref Range Status   Specimen Description BLOOD BLOOD LEFT HAND  Final   Special Requests   Final    BOTTLES DRAWN AEROBIC AND ANAEROBIC Blood Culture adequate volume   Culture   Final    NO GROWTH 5 DAYS Performed at [facility], [address] [place], [place] [address]    Report Status 10/01/2018 FINAL  Final    RADIOLOGY:  No results found.    Management plans discussed with the patient, family and they are in agreement.  CODE STATUS:  Code Status History    Date Active Date Inactive Code Status Order ID Comments User Context   09/25/2018 [id number] 09/29/2018 2252 Full Code [id number]  Demetrios Loll, MD ED    TOTAL TIME TAKING CARE OF THIS PATIENT: 40 minutes.    Henreitta Leber M.D on 10/10/2018 at 8:00 PM  Between 7am to 6pm - Pager - [phone]  After 6pm go to [email or web address] - Proofreader  [facility]  Office  [phone]  CC: Primary care physician; Novella Rob, FNP

## 2018-10-19 NOTE — Anesthesia Postprocedure Evaluation (Signed)
Anesthesia Post Note  Patient: Karen [name]  Procedure(s) Performed: ESOPHAGOGASTRODUODENOSCOPY (EGD) (N/A )  Patient location during evaluation: PACU Anesthesia Type: General Level of consciousness: awake and alert Pain management: pain level controlled Vital Signs Assessment: post-procedure vital signs reviewed and stable Respiratory status: spontaneous breathing, nonlabored ventilation, respiratory function stable and patient connected to nasal cannula oxygen Cardiovascular status: blood pressure returned to baseline and stable Postop Assessment: no apparent nausea or vomiting Anesthetic complications: no     Last Vitals:  Vitals:   09/29/18 1539 09/29/18 1549  BP: (!) 148/74 (!) 148/73  Pulse:    Resp:    Temp:    SpO2:      Last Pain:  Vitals:   09/29/18 1519  TempSrc: Tympanic  PainSc: 4                  Molli Barrows

## 2018-10-22 ENCOUNTER — Other Ambulatory Visit: Payer: Self-pay | Admitting: Gastroenterology

## 2018-12-19 ENCOUNTER — Other Ambulatory Visit: Payer: Self-pay

## 2018-12-19 ENCOUNTER — Emergency Department: Payer: Medicaid Other

## 2018-12-19 ENCOUNTER — Emergency Department
Admission: EM | Admit: 2018-12-19 | Discharge: 2018-12-19 | Disposition: A | Discharge status: Home / Self Care | Payer: Medicaid Other | Attending: Emergency Medicine | Admitting: Emergency Medicine

## 2018-12-19 ENCOUNTER — Encounter: Payer: Self-pay | Admitting: Emergency Medicine

## 2018-12-19 DIAGNOSIS — Z794 Long term (current) use of insulin: Secondary | ICD-10-CM | POA: Diagnosis not present

## 2018-12-19 DIAGNOSIS — I10 Essential (primary) hypertension: Secondary | ICD-10-CM | POA: Insufficient documentation

## 2018-12-19 DIAGNOSIS — R509 Fever, unspecified: Secondary | ICD-10-CM | POA: Diagnosis not present

## 2018-12-19 DIAGNOSIS — E119 Type 2 diabetes mellitus without complications: Secondary | ICD-10-CM | POA: Insufficient documentation

## 2018-12-19 DIAGNOSIS — R220 Localized swelling, mass and lump, head: Secondary | ICD-10-CM | POA: Diagnosis not present

## 2018-12-19 DIAGNOSIS — K0889 Other specified disorders of teeth and supporting structures: Secondary | ICD-10-CM | POA: Diagnosis present

## 2018-12-19 DIAGNOSIS — Z79899 Other long term (current) drug therapy: Secondary | ICD-10-CM | POA: Diagnosis not present

## 2018-12-19 DIAGNOSIS — J45909 Unspecified asthma, uncomplicated: Secondary | ICD-10-CM | POA: Insufficient documentation

## 2018-12-19 DIAGNOSIS — Z87891 Personal history of nicotine dependence: Secondary | ICD-10-CM | POA: Insufficient documentation

## 2018-12-19 LAB — CBC WITH DIFFERENTIAL/PLATELET
Abs Immature Granulocytes: 0.47 10*3/uL — ABNORMAL HIGH (ref 0.00–0.07)
Basophils Absolute: 0.1 10*3/uL (ref 0.0–0.1)
Basophils Relative: 1 %
Eosinophils Absolute: 0.3 10*3/uL (ref 0.0–0.5)
Eosinophils Relative: 2 %
HCT: 35.5 % — ABNORMAL LOW (ref 36.0–46.0)
Hemoglobin: 12.3 g/dL (ref 12.0–15.0)
Immature Granulocytes: 4 %
Lymphocytes Relative: 24 %
Lymphs Abs: 3.1 10*3/uL (ref 0.7–4.0)
MCH: 27.8 pg (ref 26.0–34.0)
MCHC: 34.6 g/dL (ref 30.0–36.0)
MCV: 80.1 fL (ref 80.0–100.0)
Monocytes Absolute: 1.2 10*3/uL — ABNORMAL HIGH (ref 0.1–1.0)
Monocytes Relative: 9 %
Neutro Abs: 7.7 10*3/uL (ref 1.7–7.7)
Neutrophils Relative %: 60 %
Platelets: 328 10*3/uL (ref 150–400)
RBC: 4.43 MIL/uL (ref 3.87–5.11)
RDW: 12 % (ref 11.5–15.5)
WBC: 12.9 10*3/uL — ABNORMAL HIGH (ref 4.0–10.5)
nRBC: 0 % (ref 0.0–0.2)

## 2018-12-19 LAB — COMPREHENSIVE METABOLIC PANEL
ALT: 27 U/L (ref 0–44)
AST: 19 U/L (ref 15–41)
Albumin: 3.9 g/dL (ref 3.5–5.0)
Alkaline Phosphatase: 245 U/L — ABNORMAL HIGH (ref 38–126)
Anion gap: 12 (ref 5–15)
BUN: 31 mg/dL — ABNORMAL HIGH (ref 8–23)
CO2: 22 mmol/L (ref 22–32)
Calcium: 8.9 mg/dL (ref 8.9–10.3)
Chloride: 103 mmol/L (ref 98–111)
Creatinine, Ser: 1.39 mg/dL — ABNORMAL HIGH (ref 0.44–1.00)
GFR calc Af Amer: 47 mL/min — ABNORMAL LOW (ref >60)
GFR calc non Af Amer: 41 mL/min — ABNORMAL LOW (ref >60)
Glucose, Bld: 291 mg/dL — ABNORMAL HIGH (ref 70–99)
Potassium: 4.2 mmol/L (ref 3.5–5.1)
Sodium: 137 mmol/L (ref 135–145)
Total Bilirubin: 0.6 mg/dL (ref 0.3–1.2)
Total Protein: 8.1 g/dL (ref 6.5–8.1)

## 2018-12-19 MED ORDER — IOHEXOL 300 MG/ML  SOLN
60.0000 mL | Freq: Once | INTRAMUSCULAR | Status: AC | PRN
  Administered 2018-12-19: 18:00:00 60 mL via INTRAVENOUS
  Filled 2018-12-19: qty 60

## 2018-12-19 MED ORDER — KETOROLAC TROMETHAMINE 30 MG/ML IJ SOLN
30.0000 mg | Freq: Once | INTRAMUSCULAR | Status: AC
  Administered 2018-12-19: 17:00:00 30 mg via INTRAVENOUS
  Filled 2018-12-19: qty 1

## 2018-12-19 MED ORDER — CLINDAMYCIN HCL 300 MG PO CAPS: 300.0000 mg | ORAL_CAPSULE | Freq: Three times a day (TID) | ORAL | 0 refills | Status: AC

## 2018-12-19 MED ORDER — CLINDAMYCIN PHOSPHATE 600 MG/50ML IV SOLN
600.0000 mg | Freq: Once | INTRAVENOUS | Status: AC
  Administered 2018-12-19: 600 mg via INTRAVENOUS
  Filled 2018-12-19: qty 50

## 2018-12-19 NOTE — ED Provider Notes (Addendum)
----------------------------------------- 6:36 PM on 12/19/2018 -----------------------------------------  Medical screening examination/treatment/procedure(s) were conducted as a shared visit with non-physician practitioner(s) and myself.  I personally evaluated the [name] during the encounter.  Pt p/w right lower jaw pain after recent molar extration.  VS and CT reassuring, pt non toxic with unlabored breathing. Stable for DC and outpatient follow up. Doubt vascular occlusion or dissection.   Final diagnoses:  Pain, dental  Karen Mew, MD     Emergency Department Provider Note  ____________________________________________  Time seen: Approximately 4:46 PM  I have reviewed the triage vital signs and the nursing notes.   HISTORY  Chief Complaint Dental Pain   Karen [name]     HPI LARUTH [name] is a 63 y.o. female presents to the emergency department with right lower jaw swelling and pain underneath her tongue.  [name] has had fever at home as high as nose hours 101 F assess orally.  [name] reports that she has a right lower jaw wisdom tooth pulled by a local dentist approximately 1 week ago and has had worsening pain.  She has been able to maintain her own secretions at home.  [name] states that she has not followed up with her dentist.  No other alleviating measures have been attempted.   Past Medical History:  Diagnosis Date  . Alcoholic pancreatitis   . Asthma   . Depression   . Diabetes mellitus without complication ([place])   . DKA (diabetic ketoacidoses) ([place]) 02/17/2015  . Emphysematous cystitis   . Hypercholesteremia   . Hypertension   . Hypokalemia      Immunizations up to date:  Yes.     Past Medical History:  Diagnosis Date  . Alcoholic pancreatitis   . Asthma   . Depression   . Diabetes mellitus without complication ([place])   . DKA (diabetic ketoacidoses) ([place]) 02/17/2015  . Emphysematous cystitis   . Hypercholesteremia   .  Hypertension   . Hypokalemia     [name] Active Problem List   Diagnosis Date Noted  . Chronic pancreatitis ([place]) 09/25/2018  . 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 Coffee ground emesis   . Upper GI bleed 11/11/2017  . Pancreatitis, acute 01/03/2017  . ETOH abuse 01/03/2017  . UTI (urinary tract infection) 01/03/2017  . Nausea and vomiting   . Esophageal candidiasis ([place])   . Intractable vomiting with nausea   . Hypertensive urgency 04/21/2016  . Abdominal pain   . Acute renal failure ([place])   . Nausea vomiting and diarrhea   . Smoker   . Poorly controlled type 2 diabetes mellitus ([place])   . Acute renal insufficiency 10/29/2015  . Elevated troponin 10/29/2015  . Urinary tract infection without hematuria 10/29/2015  . Left flank pain 10/29/2015  . Malignant essential hypertension 10/29/2015  . DKA (diabetic ketoacidoses) ([place]) 02/17/2015  . Emphysematous cystitis     Past Surgical History:  Procedure Laterality Date  . ABDOMINAL HYSTERECTOMY  1996  . APPENDECTOMY  1997  . ESOPHAGOGASTRODUODENOSCOPY N/A 11/12/2017   Procedure: ESOPHAGOGASTRODUODENOSCOPY (EGD);  Surgeon: Lin Landsman, MD;  Location: [facility] ENDOSCOPY;  Service: Gastroenterology;  Laterality: N/A;  . ESOPHAGOGASTRODUODENOSCOPY N/A 09/29/2018   Procedure: ESOPHAGOGASTRODUODENOSCOPY (EGD);  Surgeon: Toledo, Benay Pike, MD;  Location: [facility] ENDOSCOPY;  Service: Gastroenterology;  Laterality: N/A;  . ESOPHAGOGASTRODUODENOSCOPY (EGD) WITH PROPOFOL N/A 04/24/2016   Procedure: ESOPHAGOGASTRODUODENOSCOPY (EGD) WITH PROPOFOL;  Surgeon: [name], MD;  Location: [facility] ENDOSCOPY;  Service: Endoscopy;  Laterality: N/A;  . HAND SURGERY  1988  .  THYROID SURGERY  2013    Prior to Admission medications   Medication Sig Start Date End Date Taking? Authorizing Provider  amLODipine (NORVASC) 5 MG tablet Take 1 tablet (5 mg total) by mouth daily. 09/22/18   Merlyn Lot, MD  atorvastatin (LIPITOR) 40 MG tablet Take 1  tablet (40 mg total) by mouth at bedtime. 09/22/18   Merlyn Lot, MD  clindamycin (CLEOCIN) 300 MG capsule Take 1 capsule (300 mg total) by mouth 3 (three) times daily for 10 days. 12/19/18 12/29/18  Lannie Fields, PA-C  cyclobenzaprine (FLEXERIL) 5 MG tablet Take 1 tablet (5 mg total) by mouth 2 (two) times daily. [name] taking differently: Take 5 mg by mouth 2 (two) times daily as needed for muscle spasms.  01/07/17   Dustin Flock, MD  flurazepam [facility]) 15 MG capsule Take 1 capsule (15 mg total) by mouth at bedtime as needed for sleep. 01/07/17   Dustin Flock, MD  gabapentin (NEURONTIN) 100 MG capsule Take 100 mg by mouth 2 (two) times daily. 01/27/18   [provider]  insulin aspart protamine - aspart (NOVOLOG MIX 70/30 FLEXPEN) (70-30) 100 UNIT/ML FlexPen Inject 0.15 mLs (15 Units total) into the skin 2 (two) times daily. 09/22/18   Merlyn Lot, MD  lipase/protease/amylase (CREON) 36000 UNITS CPEP capsule Take 1 capsule (36,000 Units total) by mouth 2 (two) times daily with a meal. Take this with snacks up to twice a day. 09/22/18   Merlyn Lot, MD  omeprazole (PRILOSEC) 40 MG capsule Take 1 capsule (40 mg total) by mouth 2 (two) times daily for 14 days. 09/22/18 10/06/18  Merlyn Lot, MD  GLUCERNA (GLUCERNA) LIQD Take 237 mLs by mouth 3 (three) times daily between meals. 02/19/15 02/27/15  Bettey Costa, MD    Allergies Penicillins, Reglan [metoclopramide], and Percocet [oxycodone-acetaminophen]  Family History  Family history unknown: Yes    Social History Social History   Tobacco Use  . Smoking status: Former Research scientist (life sciences)  . Smokeless tobacco: Never Used  . Tobacco comment: quit 7 years ago   Substance Use Topics  . Alcohol use: Not Currently    Comment: quit drinking 2 years ago   . Drug use: No     Review of Systems  Constitutional: No fever/chills Eyes:  No discharge ENT: [name] has dental pain.  Respiratory: no cough. No SOB/ use of accessory  muscles to breath Gastrointestinal:   No nausea, no vomiting.  No diarrhea.  No constipation. Musculoskeletal: Negative for musculoskeletal pain. Skin: Negative for rash, abrasions, lacerations, ecchymosis.   ____________________________________________   PHYSICAL EXAM:  VITAL SIGNS: ED Triage Vitals [12/19/18 1532]  Enc Vitals Group     BP 138/69     Pulse Rate 87     Resp 16     Temp 98.5 F (36.9 C)     Temp Source Oral     SpO2 99 %     Weight 110 lb (49.9 kg)     Height 5\' 1"  (1.549 m)     Head Circumference      Peak Flow      Pain Score 9     Pain Loc      Pain Edu?      Excl. in [place]?      Constitutional: Alert and oriented. Well appearing and in no acute distress. Eyes: Conjunctivae are normal. PERRL. EOMI. Head: Atraumatic. ENT:      Nose: No congestion/rhinnorhea.      Mouth/Throat: Mucous membranes are moist.  [name]  has pain to palpation underneath the tongue. Neck: No stridor.  No cervical spine tenderness to palpation. Hematological/Lymphatic/Immunilogical: Palpable cervical lymphadenopathy.  Cardiovascular: Normal rate, regular rhythm. Normal S1 and S2.  Good peripheral circulation. Respiratory: Normal respiratory effort without tachypnea or retractions. Lungs CTAB. Good air entry to the bases with no decreased or absent breath sounds Skin:  Skin is warm, dry and intact. No rash noted. Psychiatric: Mood and affect are normal for age. Speech and behavior are normal.   ____________________________________________   LABS (all labs ordered are listed, but only abnormal results are displayed)  Labs Reviewed  CBC WITH DIFFERENTIAL/PLATELET - Abnormal; Notable for the following components:      Result Value   WBC 12.9 (*)    HCT 35.5 (*)    Monocytes Absolute 1.2 (*)    Abs Immature Granulocytes 0.47 (*)    All other components within normal limits  COMPREHENSIVE METABOLIC PANEL - Abnormal; Notable for the following components:   Glucose, Bld 291 (*)     BUN 31 (*)    Creatinine, Ser 1.39 (*)    Alkaline Phosphatase 245 (*)    GFR calc non Af Amer 41 (*)    GFR calc Af Amer 47 (*)    All other components within normal limits   ____________________________________________  EKG   ____________________________________________  RADIOLOGY Unk Pinto, personally viewed and evaluated these images (plain radiographs) as part of my medical decision making, as well as reviewing the written report by the radiologist.    CT Soft Tissue Neck W Contrast  Result Date: 12/19/2018 CLINICAL DATA:  Tooth pulled on right side week ago. Difficulty swallowing. Rule out foreign body. EXAM: CT NECK WITH CONTRAST TECHNIQUE: Multidetector CT imaging of the neck was performed using the standard protocol following the bolus administration of intravenous contrast. CONTRAST:  10mL OMNIPAQUE IOHEXOL 300 MG/ML  SOLN COMPARISON:  CT neck 12/01/2010 FINDINGS: Pharynx and larynx: Normal. No mass or swelling. Salivary glands: No inflammation, mass, or stone. Previous large mass in the left submandibular gland has resolved presumably infection with abscess. Thyroid: Right thyroidectomy.  Left thyroid normal. Lymph nodes: 10 mm submandibular lymph nodes bilaterally. 12 mm level 2 lymph nodes bilaterally. No pathologic adenopathy based on size criteria. Vascular: Arterial and venous vessels enhance normally. Atherosclerotic calcification in the carotid bifurcation bilaterally. Limited intracranial: Negative Visualized orbits: Negative Mastoids and visualized paranasal sinuses: Negative Skeleton: Disc degeneration and spurring C5-6 and C6-7. No acute skeletal abnormality Recent removal of right lower molar. No residual tooth or abscess. Edentulous maxilla. No dental abscess. Upper chest: Lung apices clear bilaterally Other: None IMPRESSION: 1. Recent removal right lower third molar. No residual tooth, abscess or foreign body identified. 2. Normal pharynx without foreign  body 3. Right thyroidectomy. Electronically Signed   By: Franchot Gallo M.D.   On: 12/19/2018 18:14    ____________________________________________    PROCEDURES  Procedure(s) performed:     Procedures     Medications  clindamycin (CLEOCIN) IVPB 600 mg (0 mg Intravenous Stopped 12/19/18 1732)  ketorolac (TORADOL) 30 MG/ML injection 30 mg (30 mg Intravenous Given 12/19/18 1705)  iohexol (OMNIPAQUE) 300 MG/ML solution 60 mL (60 mLs Intravenous Contrast Given 12/19/18 1759)     ____________________________________________   INITIAL IMPRESSION / ASSESSMENT AND PLAN / ED COURSE  Pertinent labs & imaging results that were available during my care of the [name] were reviewed by me and considered in my medical decision making (see chart for details).  Assessment and Plan:  Neck Pain: Facial swelling: Fever:  63 year old female presents to the emergency department with persistent dental pain after having a right lower jaw wisdom tooth extracted.  [name] complained of pain underneath the tongue and fever at home.  Differential diagnosis included Ludwig's, Lemierre's, residual dental abscess, jaw cellulitis, dental pain..  CT soft tissue neck revealed no evidence of Ludwig's or other abscess formation.  [name] did have mild leukocytosis on CBC.  [name] was given IV clindamycin in the emergency department and Toradol for pain.  She was discharged with clindamycin and advised to follow-up with her dentist.  Return precautions were given to return for new or worsening symptoms.  All [name] questions were answered.  ____________________________________________  FINAL CLINICAL IMPRESSION(S) / ED DIAGNOSES  Final diagnoses:  Pain, dental      NEW MEDICATIONS STARTED DURING THIS VISIT:  ED Discharge Orders         Ordered    clindamycin (CLEOCIN) 300 MG capsule  3 times daily     12/19/18 1824              This chart was dictated using voice recognition  software/Dragon. Despite best efforts to proofread, errors can occur which can change the meaning. Any change was purely unintentional.     Lannie Fields, PA-C 12/19/18 Anabel Halon, MD 12/19/18 1831    Karen Mew, MD 12/19/18 Bosie Helper

## 2018-12-19 NOTE — ED Triage Notes (Signed)
Pt reports had a tooth pulled on the right side a week ago and is still having pain.

## 2018-12-19 NOTE — ED Notes (Signed)
See triage note  Presents with dental pain  States had tooth pulled about 1 week ago  Cont's to have pain

## 2019-01-04 ENCOUNTER — Encounter: Payer: Self-pay | Admitting: Physical Therapy

## 2019-01-04 ENCOUNTER — Other Ambulatory Visit: Payer: Self-pay

## 2019-01-04 ENCOUNTER — Ambulatory Visit: Payer: Medicaid Other | Attending: Physician Assistant | Admitting: Physical Therapy

## 2019-01-04 DIAGNOSIS — M25512 Pain in left shoulder: Secondary | ICD-10-CM | POA: Diagnosis present

## 2019-01-04 DIAGNOSIS — G8929 Other chronic pain: Secondary | ICD-10-CM | POA: Diagnosis present

## 2019-01-04 DIAGNOSIS — M25612 Stiffness of left shoulder, not elsewhere classified: Secondary | ICD-10-CM | POA: Insufficient documentation

## 2019-01-04 NOTE — Therapy (Signed)
[facility] [address], [place], [address] Phone: [phone]   Fax:  [phone]  Physical Therapy Evaluation  Patient Details  Name: Karen [name] MRN: [id number] Date of Birth: 08-Dec-1955 No data recorded  Encounter Date: 01/04/2019    Past Medical History:  Diagnosis Date  . Alcoholic pancreatitis   . Asthma   . Depression   . Diabetes mellitus without complication ([place])   . DKA (diabetic ketoacidoses) ([place]) 02/17/2015  . Emphysematous cystitis   . Hypercholesteremia   . Hypertension   . Hypokalemia     Past Surgical History:  Procedure Laterality Date  . ABDOMINAL HYSTERECTOMY  1996  . APPENDECTOMY  1997  . ESOPHAGOGASTRODUODENOSCOPY N/A 11/12/2017   Procedure: ESOPHAGOGASTRODUODENOSCOPY (EGD);  Surgeon: Lin Landsman, MD;  Location: [facility] ENDOSCOPY;  Service: Gastroenterology;  Laterality: N/A;  . ESOPHAGOGASTRODUODENOSCOPY N/A 09/29/2018   Procedure: ESOPHAGOGASTRODUODENOSCOPY (EGD);  Surgeon: Toledo, Benay Pike, MD;  Location: [facility] ENDOSCOPY;  Service: Gastroenterology;  Laterality: N/A;  . ESOPHAGOGASTRODUODENOSCOPY (EGD) WITH PROPOFOL N/A 04/24/2016   Procedure: ESOPHAGOGASTRODUODENOSCOPY (EGD) WITH PROPOFOL;  Surgeon: [name], MD;  Location: [facility] ENDOSCOPY;  Service: Endoscopy;  Laterality: N/A;  . HAND SURGERY  1988  . THYROID SURGERY  2013    There were no vitals filed for this visit.   Subjective Assessment - 01/04/19 0836    Pertinent History  Patient is a 63 year old female with LUE radiating pain. Reports aching pain in L lateral shoulder that she says feels "deep" and is an aching pain, with some deep sharp pain. Endorses pain from the L shoulder to L elbow, denies numbness/tingling sensations. Reports she has increased pain with lifting her LUE, grabbing objects with her LUE, sleeping on the L side, and pulling activities. Reports pain medication dulls her pain for a little  while, but her pain returns. Worst pain 9/10, best 5/10. Patient is on disability, and enjoys cooking, which she has diffculty with lifting pots and pans while cooking    Limitations  Lifting;House hold activities    How long can you sit comfortably?  unlimited    How long can you stand comfortably?  unlimited    How long can you walk comfortably?  unlimited    Diagnostic tests  Xray of L shoulder negative    Currently in Pain?  Yes    Pain Location  Shoulder    Pain Orientation  Left;Upper;Lateral    Pain Descriptors / Indicators  Aching;Sharp    Pain Type  Chronic pain    Pain Radiating Towards  to L elbow    Pain Onset  More than a month ago    Pain Frequency  Constant    Aggravating Factors   lifting her LUE, grabbing objects with her LUE, sleeping on the L side, and pulling activities.    Pain Relieving Factors  pain medication    Effect of Pain on Daily Activities  donning/doffing clothing, grabbing seatbelt        Dominant hand: Right  OBJECTIVE  MUSCULOSKELETAL: Tremor: Normal Bulk: Normal Tone: Normal  Cervical Screen AROM: WFL and painless with overpressure in all planes Spurlings A (ipsilateral lateral flexion/axial compression): Negative Spurlings B (ipsilateral lateral flexion/contralateral rotation/axial compression): Does report pain but to the posterior cervical spine Repeated movement: No centralization or peripheralization with protraction or retraction Hoffman Sign (cervical cord compression): Negative bilat ULTT Median: Negative bilat ULTT Ulnar: Negative bilat ULTT Radial: Negative bilat  Elbow Screen Elbow  AROM: WNL bilat  Palpation TTP of all shoulder with gaurding response. After some time, able to calm down pain response, to localize more to L bicep which has increased tension and proximal bicep tendon insertion   Strength R/L 4+/4- Shoulder flexion (anterior deltoid/pec major/coracobrachialis, axillary n. (C5-6) and musculocutaneous n.  (C5-7)) 4+/4- Shoulder abduction (deltoid/supraspinatus, axillary/suprascapular n, C5) 4/3+ Shoulder external rotation (infraspinatus/teres minor) 5/4- Shoulder internal rotation (subcapularis/lats/pec major) 5/4 Shoulder extension (posterior deltoid, lats, teres major, axillary/thoracodorsal n.) 5/5 Shoulder horizontal abduction 5/5 Elbow flexion (biceps brachii, brachialis, brachioradialis, musculoskeletal n, C5-6) 5/5* Elbow extension (triceps, radial n, C7) 5/5 Wrist Extension 5/5 Wrist Flexion 5/5 Finger adduction (interossei, ulnar n, T1) 3+/unable Y lower trap 3/3 T mid trap/periscapulars  AROM R/L 180/116* Shoulder flexion 180/85* Shoulder abduction C8//L PSIS Shoulder external rotation T8/ L occiput Shoulder internal rotation 60/60 Shoulder extension All cervical AROM WNL  *Indicates pain, overpressure performed unless otherwise indicated  PROM R/L 180/120 Shoulder flexion 180/90 Shoulder abduction 90/25 Shoulder external rotation 70/50 Shoulder internal rotation 60/60 Shoulder extension *Indicates pain, overpressure performed unless otherwise indicated  Accessory Motions/Glides Glenohumeral: Posterior: Difficult to assess d/t gaurding Inferior: WNL   Acromioclavicular:  WNL all directions  Sternoclavicular: WNL all directions   Muscle Length Testing Pectoralis Major: Normal bilat Pectoralis Minor: Tension bilat, able to achieve full lengthening with cuing Biceps: L abnormal  NEUROLOGICAL:  Mental Status Patient is oriented to person, place and time.  Recent memory is intact.  Remote memory is intact.  Attention span and concentration are intact.  Expressive speech is intact.  Patient's fund of knowledge is within normal limits for educational level.  Cranial Nerves Visual acuity and visual fields are intact  Extraocular muscles are intact  Facial sensation is intact bilaterally  Facial strength is intact bilaterally  Hearing is normal as  tested by gross conversation Palate elevates midline, normal phonation  Shoulder shrug strength is intact  Tongue protrudes midline   Sensation Grossly intact to light touch bilateral UE as determined by testing dermatomes C2-T2 Proprioception and hot/cold testing deferred on this date  SPECIAL TESTS  Rotator Cuff  Drop Arm Test: Negative bilat Painful Arc (Pain from 60 to 120 degrees scaption): Positive on L at 80d Infraspinatus Muscle Test: Positive on L, appears to mostly be d/t guarding   Subacromial Impingement Hawkins-Kennedy: Positive on L Neer (Block scapula, PROM flexion): Positive on L Painful Arc (Pain from 60 to 120 degrees scaption): Positive on L Empty Can: Positive on L External Rotation Resistance: Positive on L  Labral Tear Biceps Load II (120 elevation, full ER, 90 elbow flexion, full supination, resisted elbow flexion): Positive on L Crank (160 scaption, axial load with IR/ER): Negative  Active Compression Test: Negative  Bicep Tendon Pathology Speed (shoulder flexion to 90, external rotation, full elbow extension, and forearm supination with resistance: Positive bilat Yergason's (resisted shoulder ER and supination/biceps tendon pathology): Positive on L  Shoulder Instability Sulcus Sign:Negative  Anterior Apprehension:Negative   Ther-Ex Educated patient on anatomy involved in shoulder positioning/posture with good understanding. Following demo and cuing, patient able to complete a set of the following with accuracy, and verbalize understanding of frequency, rep/set range, hold time, etc.Exercises  Bicep Stretch at Table - 30sec hold - 3x daily - 7x weekly  Doorway Pec Stretch at 60 Degrees Abduction with Arm Straight - 30sec hold - 3x daily - 7x weekly  Seated Shoulder Flexion AAROM with Pulley Behind - 15 reps - 3sec hold - 1x daily - 7x  weekly  Seated Shoulder Abduction AAROM with Pulley Behind - 15 reps - 3sec hold - 1x daily - 7x weekly  Seated  Scapular Retraction - 3sec hold - 8x daily - 7x weekly    .   Marland Kitchen                       Objective measurements completed on examination: See above findings.                             Patient will benefit from skilled therapeutic intervention in order to improve the following deficits and impairments:     Visit Diagnosis: No diagnosis found.     Problem List Patient Active Problem List   Diagnosis Date Noted  . Chronic pancreatitis ([place]) 09/25/2018  . 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 Coffee ground emesis   . Upper GI bleed 11/11/2017  . Pancreatitis, acute 01/03/2017  . ETOH abuse 01/03/2017  . UTI (urinary tract infection) 01/03/2017  . Nausea and vomiting   . Esophageal candidiasis ([place])   . Intractable vomiting with nausea   . Hypertensive urgency 04/21/2016  . Abdominal pain   . Acute renal failure ([place])   . Nausea vomiting and diarrhea   . Smoker   . Poorly controlled type 2 diabetes mellitus ([place])   . Acute renal insufficiency 10/29/2015  . Elevated troponin 10/29/2015  . Urinary tract infection without hematuria 10/29/2015  . Left flank pain 10/29/2015  . Malignant essential hypertension 10/29/2015  . DKA (diabetic ketoacidoses) ([place]) 02/17/2015  . Emphysematous cystitis    Shelton Silvas PT, DPT Shelton Silvas 01/04/2019, 8:44 AM  [facility] [address], [place], [address] Phone: [phone]   Fax:  [phone]  Name: Karen [name] MRN: [id number] Date of Birth: 1955-02-11

## 2019-01-11 ENCOUNTER — Ambulatory Visit: Payer: Medicaid Other | Attending: Physician Assistant | Admitting: Physical Therapy

## 2019-01-17 ENCOUNTER — Ambulatory Visit: Payer: Medicaid Other | Admitting: Physical Therapy

## 2019-01-24 ENCOUNTER — Emergency Department: Payer: Medicaid Other

## 2019-01-24 ENCOUNTER — Inpatient Hospital Stay
Admission: EM | Admit: 2019-01-24 | Discharge: 2019-01-29 | DRG: 439 | Disposition: A | Discharge status: Home Health | Payer: Medicaid Other | Attending: Internal Medicine | Admitting: Internal Medicine

## 2019-01-24 ENCOUNTER — Other Ambulatory Visit: Payer: Self-pay

## 2019-01-24 ENCOUNTER — Ambulatory Visit: Payer: Medicaid Other | Admitting: Physical Therapy

## 2019-01-24 ENCOUNTER — Encounter: Payer: Self-pay | Admitting: Emergency Medicine

## 2019-01-24 DIAGNOSIS — R059 Cough, unspecified: Secondary | ICD-10-CM | POA: Diagnosis present

## 2019-01-24 DIAGNOSIS — K921 Melena: Secondary | ICD-10-CM | POA: Diagnosis present

## 2019-01-24 DIAGNOSIS — K8500 Idiopathic acute pancreatitis without necrosis or infection: Principal | ICD-10-CM | POA: Diagnosis present

## 2019-01-24 DIAGNOSIS — Z888 Allergy status to other drugs, medicaments and biological substances status: Secondary | ICD-10-CM

## 2019-01-24 DIAGNOSIS — N1831 Chronic kidney disease, stage 3a: Secondary | ICD-10-CM | POA: Diagnosis present

## 2019-01-24 DIAGNOSIS — Z880 Allergy status to penicillin: Secondary | ICD-10-CM

## 2019-01-24 DIAGNOSIS — Z794 Long term (current) use of insulin: Secondary | ICD-10-CM

## 2019-01-24 DIAGNOSIS — R945 Abnormal results of liver function studies: Secondary | ICD-10-CM

## 2019-01-24 DIAGNOSIS — N390 Urinary tract infection, site not specified: Secondary | ICD-10-CM | POA: Diagnosis present

## 2019-01-24 DIAGNOSIS — K219 Gastro-esophageal reflux disease without esophagitis: Secondary | ICD-10-CM | POA: Diagnosis present

## 2019-01-24 DIAGNOSIS — K859 Acute pancreatitis without necrosis or infection, unspecified: Secondary | ICD-10-CM | POA: Diagnosis present

## 2019-01-24 DIAGNOSIS — G8929 Other chronic pain: Secondary | ICD-10-CM | POA: Diagnosis present

## 2019-01-24 DIAGNOSIS — N179 Acute kidney failure, unspecified: Secondary | ICD-10-CM | POA: Diagnosis present

## 2019-01-24 DIAGNOSIS — E7800 Pure hypercholesterolemia, unspecified: Secondary | ICD-10-CM

## 2019-01-24 DIAGNOSIS — E785 Hyperlipidemia, unspecified: Secondary | ICD-10-CM | POA: Diagnosis present

## 2019-01-24 DIAGNOSIS — M7502 Adhesive capsulitis of left shoulder: Secondary | ICD-10-CM | POA: Diagnosis present

## 2019-01-24 DIAGNOSIS — N1832 Chronic kidney disease, stage 3b: Secondary | ICD-10-CM | POA: Diagnosis present

## 2019-01-24 DIAGNOSIS — N3000 Acute cystitis without hematuria: Secondary | ICD-10-CM | POA: Diagnosis present

## 2019-01-24 DIAGNOSIS — E11649 Type 2 diabetes mellitus with hypoglycemia without coma: Secondary | ICD-10-CM | POA: Diagnosis not present

## 2019-01-24 DIAGNOSIS — K861 Other chronic pancreatitis: Secondary | ICD-10-CM | POA: Diagnosis present

## 2019-01-24 DIAGNOSIS — R7989 Other specified abnormal findings of blood chemistry: Secondary | ICD-10-CM | POA: Diagnosis present

## 2019-01-24 DIAGNOSIS — R109 Unspecified abdominal pain: Secondary | ICD-10-CM

## 2019-01-24 DIAGNOSIS — I10 Essential (primary) hypertension: Secondary | ICD-10-CM | POA: Diagnosis present

## 2019-01-24 DIAGNOSIS — Z885 Allergy status to narcotic agent status: Secondary | ICD-10-CM

## 2019-01-24 DIAGNOSIS — E1122 Type 2 diabetes mellitus with diabetic chronic kidney disease: Secondary | ICD-10-CM | POA: Diagnosis present

## 2019-01-24 DIAGNOSIS — B951 Streptococcus, group B, as the cause of diseases classified elsewhere: Secondary | ICD-10-CM | POA: Diagnosis present

## 2019-01-24 DIAGNOSIS — I129 Hypertensive chronic kidney disease with stage 1 through stage 4 chronic kidney disease, or unspecified chronic kidney disease: Secondary | ICD-10-CM | POA: Diagnosis present

## 2019-01-24 DIAGNOSIS — N3090 Cystitis, unspecified without hematuria: Secondary | ICD-10-CM

## 2019-01-24 DIAGNOSIS — R112 Nausea with vomiting, unspecified: Secondary | ICD-10-CM

## 2019-01-24 DIAGNOSIS — M25512 Pain in left shoulder: Secondary | ICD-10-CM

## 2019-01-24 DIAGNOSIS — E860 Dehydration: Secondary | ICD-10-CM | POA: Diagnosis present

## 2019-01-24 DIAGNOSIS — K5900 Constipation, unspecified: Secondary | ICD-10-CM

## 2019-01-24 DIAGNOSIS — D649 Anemia, unspecified: Secondary | ICD-10-CM | POA: Diagnosis present

## 2019-01-24 DIAGNOSIS — R1310 Dysphagia, unspecified: Secondary | ICD-10-CM | POA: Diagnosis present

## 2019-01-24 DIAGNOSIS — Z79899 Other long term (current) drug therapy: Secondary | ICD-10-CM

## 2019-01-24 DIAGNOSIS — Z87891 Personal history of nicotine dependence: Secondary | ICD-10-CM

## 2019-01-24 DIAGNOSIS — E1169 Type 2 diabetes mellitus with other specified complication: Secondary | ICD-10-CM

## 2019-01-24 DIAGNOSIS — F1011 Alcohol abuse, in remission: Secondary | ICD-10-CM | POA: Diagnosis present

## 2019-01-24 DIAGNOSIS — T402X5A Adverse effect of other opioids, initial encounter: Secondary | ICD-10-CM

## 2019-01-24 DIAGNOSIS — R05 Cough: Secondary | ICD-10-CM | POA: Diagnosis present

## 2019-01-24 DIAGNOSIS — Z20822 Contact with and (suspected) exposure to covid-19: Secondary | ICD-10-CM | POA: Diagnosis present

## 2019-01-24 LAB — CBC
HCT: 37.2 % (ref 36.0–46.0)
Hemoglobin: 12.3 g/dL (ref 12.0–15.0)
MCH: 28 pg (ref 26.0–34.0)
MCHC: 33.1 g/dL (ref 30.0–36.0)
MCV: 84.5 fL (ref 80.0–100.0)
Platelets: 196 10*3/uL (ref 150–400)
RBC: 4.4 MIL/uL (ref 3.87–5.11)
RDW: 13 % (ref 11.5–15.5)
WBC: 14.1 10*3/uL — ABNORMAL HIGH (ref 4.0–10.5)
nRBC: 0 % (ref 0.0–0.2)

## 2019-01-24 LAB — GLUCOSE, CAPILLARY
Glucose-Capillary: 265 mg/dL — ABNORMAL HIGH (ref 70–99)
Glucose-Capillary: 224 mg/dL — ABNORMAL HIGH (ref 70–99)
Glucose-Capillary: 108 mg/dL — ABNORMAL HIGH (ref 70–99)

## 2019-01-24 LAB — HEMOGLOBIN A1C
Hgb A1c MFr Bld: 11.4 % — ABNORMAL HIGH (ref 4.8–5.6)
Mean Plasma Glucose: 280.48 mg/dL

## 2019-01-24 LAB — LIPASE, BLOOD: Lipase: 163 U/L — ABNORMAL HIGH (ref 11–51)

## 2019-01-24 LAB — URINALYSIS, COMPLETE (UACMP) WITH MICROSCOPIC
Bilirubin Urine: NEGATIVE
Glucose, UA: >=500 mg/dL — AB
Hgb urine dipstick: NEGATIVE
Ketones, ur: NEGATIVE mg/dL
Nitrite: NEGATIVE
Protein, ur: 100 mg/dL — AB
Specific Gravity, Urine: 1.006 (ref 1.005–1.030)
WBC, UA: >50 WBC/hpf — ABNORMAL HIGH (ref 0–5)
pH: 6 (ref 5.0–8.0)

## 2019-01-24 LAB — COMPREHENSIVE METABOLIC PANEL
ALT: 100 U/L — ABNORMAL HIGH (ref 0–44)
AST: 73 U/L — ABNORMAL HIGH (ref 15–41)
Albumin: 3.8 g/dL (ref 3.5–5.0)
Alkaline Phosphatase: 302 U/L — ABNORMAL HIGH (ref 38–126)
Anion gap: 13 (ref 5–15)
BUN: 31 mg/dL — ABNORMAL HIGH (ref 8–23)
CO2: 24 mmol/L (ref 22–32)
Calcium: 8.9 mg/dL (ref 8.9–10.3)
Chloride: 95 mmol/L — ABNORMAL LOW (ref 98–111)
Creatinine, Ser: 1.54 mg/dL — ABNORMAL HIGH (ref 0.44–1.00)
GFR calc Af Amer: 42 mL/min — ABNORMAL LOW (ref >60)
GFR calc non Af Amer: 36 mL/min — ABNORMAL LOW (ref >60)
Glucose, Bld: 420 mg/dL — ABNORMAL HIGH (ref 70–99)
Potassium: 4.4 mmol/L (ref 3.5–5.1)
Sodium: 132 mmol/L — ABNORMAL LOW (ref 135–145)
Total Bilirubin: 0.8 mg/dL (ref 0.3–1.2)
Total Protein: 8.2 g/dL — ABNORMAL HIGH (ref 6.5–8.1)

## 2019-01-24 LAB — TROPONIN I (HIGH SENSITIVITY): Troponin I (High Sensitivity): 6 ng/L (ref <18)

## 2019-01-24 LAB — RESPIRATORY PANEL BY RT PCR (FLU A&B, COVID)
Influenza A by PCR: NEGATIVE
Influenza B by PCR: NEGATIVE
SARS Coronavirus 2 by RT PCR: NEGATIVE

## 2019-01-24 LAB — HIV ANTIBODY (ROUTINE TESTING W REFLEX): HIV Screen 4th Generation wRfx: NONREACTIVE

## 2019-01-24 LAB — TRIGLYCERIDES: Triglycerides: 195 mg/dL — ABNORMAL HIGH (ref <150)

## 2019-01-24 LAB — ETHANOL: Alcohol, Ethyl (B): <10 mg/dL (ref <10)

## 2019-01-24 MED ORDER — INSULIN ASPART 100 UNIT/ML ~~LOC~~ SOLN
0.0000 [IU] | SUBCUTANEOUS | Status: DC
  Administered 2019-01-24: 8 [IU] via SUBCUTANEOUS
  Administered 2019-01-24: 5 [IU] via SUBCUTANEOUS
  Administered 2019-01-25 (×2): 2 [IU] via SUBCUTANEOUS
  Administered 2019-01-25: 5 [IU] via SUBCUTANEOUS
  Administered 2019-01-25: 8 [IU] via SUBCUTANEOUS
  Administered 2019-01-26: 2 [IU] via SUBCUTANEOUS
  Administered 2019-01-26: 5 [IU] via SUBCUTANEOUS
  Administered 2019-01-26: 8 [IU] via SUBCUTANEOUS
  Filled 2019-01-24 (×10): qty 1

## 2019-01-24 MED ORDER — ROSUVASTATIN CALCIUM 10 MG PO TABS
20.0000 mg | ORAL_TABLET | Freq: Every day | ORAL | Status: DC
  Administered 2019-01-24 – 2019-01-28 (×5): 20 mg via ORAL
  Filled 2019-01-24 (×2): qty 1
  Filled 2019-01-24 (×4): qty 2

## 2019-01-24 MED ORDER — PANTOPRAZOLE SODIUM 40 MG PO TBEC
40.0000 mg | DELAYED_RELEASE_TABLET | Freq: Every day | ORAL | Status: DC
  Administered 2019-01-24 – 2019-01-26 (×3): 40 mg via ORAL
  Filled 2019-01-24 (×4): qty 1

## 2019-01-24 MED ORDER — FENTANYL CITRATE (PF) 100 MCG/2ML IJ SOLN
50.0000 ug | Freq: Once | INTRAMUSCULAR | Status: AC
  Administered 2019-01-24: 50 ug via INTRAVENOUS
  Filled 2019-01-24: qty 2

## 2019-01-24 MED ORDER — PANCRELIPASE (LIP-PROT-AMYL) 12000-38000 UNITS PO CPEP
36000.0000 [IU] | ORAL_CAPSULE | Freq: Two times a day (BID) | ORAL | Status: DC
  Administered 2019-01-24 – 2019-01-28 (×7): 36000 [IU] via ORAL
  Filled 2019-01-24 (×9): qty 3

## 2019-01-24 MED ORDER — CYCLOBENZAPRINE HCL 10 MG PO TABS
5.0000 mg | ORAL_TABLET | Freq: Two times a day (BID) | ORAL | Status: DC | PRN
  Administered 2019-01-25 – 2019-01-29 (×3): 5 mg via ORAL
  Filled 2019-01-24 (×3): qty 1

## 2019-01-24 MED ORDER — ONDANSETRON HCL 4 MG/2ML IJ SOLN
4.0000 mg | Freq: Three times a day (TID) | INTRAMUSCULAR | Status: DC | PRN
  Administered 2019-01-25 – 2019-01-26 (×2): 4 mg via INTRAVENOUS
  Filled 2019-01-24 (×2): qty 2

## 2019-01-24 MED ORDER — ALBUTEROL SULFATE HFA 108 (90 BASE) MCG/ACT IN AERS
2.0000 | INHALATION_SPRAY | RESPIRATORY_TRACT | Status: DC | PRN
  Filled 2019-01-24: qty 6.7

## 2019-01-24 MED ORDER — ENOXAPARIN SODIUM 40 MG/0.4ML ~~LOC~~ SOLN
40.0000 mg | SUBCUTANEOUS | Status: DC
  Administered 2019-01-24 – 2019-01-26 (×3): 40 mg via SUBCUTANEOUS
  Filled 2019-01-24 (×3): qty 0.4

## 2019-01-24 MED ORDER — SODIUM CHLORIDE 0.9 % IV BOLUS
1000.0000 mL | Freq: Once | INTRAVENOUS | Status: AC
  Administered 2019-01-24: 1000 mL via INTRAVENOUS

## 2019-01-24 MED ORDER — IOHEXOL 350 MG/ML SOLN
75.0000 mL | Freq: Once | INTRAVENOUS | Status: AC | PRN
  Administered 2019-01-24: 75 mL via INTRAVENOUS

## 2019-01-24 MED ORDER — HYDRALAZINE HCL 25 MG PO TABS: 25.0000 mg | ORAL_TABLET | Freq: Three times a day (TID) | ORAL | Status: DC | PRN

## 2019-01-24 MED ORDER — SODIUM CHLORIDE 0.9 % IV SOLN
1.0000 g | INTRAVENOUS | Status: DC
  Filled 2019-01-24: qty 10

## 2019-01-24 MED ORDER — PREGABALIN 75 MG PO CAPS
150.0000 mg | ORAL_CAPSULE | Freq: Two times a day (BID) | ORAL | Status: DC
  Administered 2019-01-24 – 2019-01-29 (×10): 150 mg via ORAL
  Filled 2019-01-24 (×11): qty 2

## 2019-01-24 MED ORDER — AMLODIPINE BESYLATE 10 MG PO TABS
10.0000 mg | ORAL_TABLET | Freq: Every day | ORAL | Status: DC
  Administered 2019-01-24 – 2019-01-29 (×6): 10 mg via ORAL
  Filled 2019-01-24: qty 1
  Filled 2019-01-24: qty 2
  Filled 2019-01-24 (×4): qty 1

## 2019-01-24 MED ORDER — SODIUM CHLORIDE 0.9 % IV SOLN
1.0000 g | Freq: Once | INTRAVENOUS | Status: AC
  Administered 2019-01-24: 1 g via INTRAVENOUS
  Filled 2019-01-24: qty 10

## 2019-01-24 MED ORDER — PROCHLORPERAZINE EDISYLATE 10 MG/2ML IJ SOLN
10.0000 mg | Freq: Once | INTRAMUSCULAR | Status: AC
  Administered 2019-01-24: 10 mg via INTRAVENOUS
  Filled 2019-01-24: qty 2

## 2019-01-24 MED ORDER — DM-GUAIFENESIN ER 30-600 MG PO TB12
1.0000 | ORAL_TABLET | Freq: Two times a day (BID) | ORAL | Status: DC
  Administered 2019-01-24 – 2019-01-29 (×10): 1 via ORAL
  Filled 2019-01-24 (×12): qty 1

## 2019-01-24 MED ORDER — INSULIN ASPART 100 UNIT/ML ~~LOC~~ SOLN
12.0000 [IU] | Freq: Two times a day (BID) | SUBCUTANEOUS | Status: DC
  Administered 2019-01-25: 12 [IU] via SUBCUTANEOUS
  Filled 2019-01-24 (×2): qty 1

## 2019-01-24 MED ORDER — MORPHINE SULFATE (PF) 2 MG/ML IV SOLN
2.0000 mg | INTRAVENOUS | Status: DC | PRN
  Administered 2019-01-25 – 2019-01-29 (×15): 2 mg via INTRAVENOUS
  Filled 2019-01-24 (×15): qty 1

## 2019-01-24 MED ORDER — SODIUM CHLORIDE 0.9 % IV SOLN: INTRAVENOUS | Status: DC

## 2019-01-24 MED ORDER — DIPHENHYDRAMINE HCL 50 MG/ML IJ SOLN
12.5000 mg | Freq: Once | INTRAMUSCULAR | Status: AC
  Administered 2019-01-24: 12.5 mg via INTRAVENOUS
  Filled 2019-01-24: qty 1

## 2019-01-24 NOTE — ED Notes (Signed)
Ok to d/c 2nd trop per Dr. Jari Pigg

## 2019-01-24 NOTE — ED Notes (Signed)
Pt ambulated to toilet with steady gait

## 2019-01-24 NOTE — ED Provider Notes (Signed)
[facility] Emergency Department Provider Note  ____________________________________________   First MD Initiated Contact with Patient 01/24/19 1018     (approximate)  I have reviewed the triage vital signs and the nursing notes.   HISTORY  Chief Complaint Fever    HPI Karen [name] is a 64 y.o. female with depression, diabetes, alcoholic pancreatitis who comes in with generalized weakness and fevers since yesterday.  Patient states that she measured a temperature this morning.  She states that she measured it multiple times and sometimes is a 98 and sometimes is at 102.  Should not take any over-the-counter Tylenol ibuprofen or anything to reduce the fever.  She states that she has had generalized abdominal pain.  Has a history of pancreatitis but states that this feels worse.  Her pain is currently severe, 8 out of 10, her whole abdomen, nothing makes better, nothing makes it worse.  She states that she feels more distended.  Had a prior appendectomy.  She has associated nausea and vomiting.  Also reports severe headache with this.  Also reports feeling more short of breath than normal with associated cough.           Past Medical History:  Diagnosis Date  . Alcoholic pancreatitis   . Asthma   . Depression   . Diabetes mellitus without complication ([place])   . DKA (diabetic ketoacidoses) ([place]) 02/17/2015  . Emphysematous cystitis   . Hypercholesteremia   . Hypertension   . Hypokalemia     Patient Active Problem List   Diagnosis Date Noted  . Chronic pancreatitis ([place]) 09/25/2018  . 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 Coffee ground emesis   . Upper GI bleed 11/11/2017  . Pancreatitis, acute 01/03/2017  . ETOH abuse 01/03/2017  . UTI (urinary tract infection) 01/03/2017  . Nausea and vomiting   . Esophageal candidiasis ([place])   . Intractable vomiting with nausea   . Hypertensive urgency 04/21/2016  . Abdominal pain   . Acute renal failure  ([place])   . Nausea vomiting and diarrhea   . Smoker   . Poorly controlled type 2 diabetes mellitus ([place])   . Acute renal insufficiency 10/29/2015  . Elevated troponin 10/29/2015  . Urinary tract infection without hematuria 10/29/2015  . Left flank pain 10/29/2015  . Malignant essential hypertension 10/29/2015  . DKA (diabetic ketoacidoses) ([place]) 02/17/2015  . Emphysematous cystitis     Past Surgical History:  Procedure Laterality Date  . ABDOMINAL HYSTERECTOMY  1996  . APPENDECTOMY  1997  . ESOPHAGOGASTRODUODENOSCOPY N/A 11/12/2017   Procedure: ESOPHAGOGASTRODUODENOSCOPY (EGD);  Surgeon: Lin Landsman, MD;  Location: [facility] ENDOSCOPY;  Service: Gastroenterology;  Laterality: N/A;  . ESOPHAGOGASTRODUODENOSCOPY N/A 09/29/2018   Procedure: ESOPHAGOGASTRODUODENOSCOPY (EGD);  Surgeon: Tol[place], Benay Pike, MD;  Location: [facility] ENDOSCOPY;  Service: Gastroenterology;  Laterality: N/A;  . ESOPHAGOGASTRODUODENOSCOPY (EGD) WITH PROPOFOL N/A 04/24/2016   Procedure: ESOPHAGOGASTRODUODENOSCOPY (EGD) WITH PROPOFOL;  Surgeon: [name], MD;  Location: [facility] ENDOSCOPY;  Service: Endoscopy;  Laterality: N/A;  . HAND SURGERY  1988  . THYROID SURGERY  2013    Prior to Admission medications   Medication Sig Start Date End Date Taking? Authorizing Provider  amLODipine (NORVASC) 5 MG tablet Take 1 tablet (5 mg total) by mouth daily. 09/22/18   Merlyn Lot, MD  atorvastatin (LIPITOR) 40 MG tablet Take 1 tablet (40 mg total) by mouth at bedtime. 09/22/18   Merlyn Lot, MD  cyclobenzaprine (FLEXERIL) 5 MG tablet Take 1 tablet (5 mg  total) by mouth 2 (two) times daily. Patient taking differently: Take 5 mg by mouth 2 (two) times daily as needed for muscle spasms.  01/07/17   Dustin Flock, MD  flurazepam [facility]) 15 MG capsule Take 1 capsule (15 mg total) by mouth at bedtime as needed for sleep. 01/07/17   Dustin Flock, MD  gabapentin (NEURONTIN) 100 MG capsule Take 100 mg by mouth 2 (two) times  daily. 01/27/18   [provider]  HYDROcodone-acetaminophen (NORCO) 10-325 MG tablet Take 1 tablet by mouth every 6 (six) hours as needed.    [provider]  insulin aspart protamine - aspart (NOVOLOG MIX 70/30 FLEXPEN) (70-30) 100 UNIT/ML FlexPen Inject 0.15 mLs (15 Units total) into the skin 2 (two) times daily. 09/22/18   Merlyn Lot, MD  lipase/protease/amylase (CREON) 36000 UNITS CPEP capsule Take 1 capsule (36,000 Units total) by mouth 2 (two) times daily with a meal. Take this with snacks up to twice a day. 09/22/18   Merlyn Lot, MD  omeprazole (PRILOSEC) 40 MG capsule Take 1 capsule (40 mg total) by mouth 2 (two) times daily for 14 days. 09/22/18 10/06/18  Merlyn Lot, MD  GLUCERNA (GLUCERNA) LIQD Take 237 mLs by mouth 3 (three) times daily between meals. 02/19/15 02/27/15  Bettey Costa, MD    Allergies Penicillins, Reglan [metoclopramide], and Percocet [oxycodone-acetaminophen]  Family History  Family history unknown: Yes    Social History Social History   Tobacco Use  . Smoking status: Former Research scientist (life sciences)  . Smokeless tobacco: Never Used  . Tobacco comment: quit 7 years ago   Substance Use Topics  . Alcohol use: Not Currently    Comment: quit drinking 2 years ago   . Drug use: No      Review of Systems Constitutional: Positive fever Eyes: No visual changes. ENT: No sore throat. Cardiovascular: Positive chest pain Respiratory: Positive shortness of breath Gastrointestinal: Positive abdominal pain nausea and vomiting no diarrhea.  No constipation. Genitourinary: Positive for dysuria Musculoskeletal: Negative for back pain. Skin: Negative for rash. Neurological: Positive for headache, no focal weakness or numbness. All other ROS negative ____________________________________________   PHYSICAL EXAM:  VITAL SIGNS: ED Triage Vitals  Enc Vitals Group     BP 01/24/19 0938 (!) 171/77     Pulse Rate 01/24/19 0938 87     Resp 01/24/19 0938  16     Temp 01/24/19 0938 98.4 F (36.9 C)     Temp Source 01/24/19 0938 Oral     SpO2 01/24/19 0938 100 %     Weight 01/24/19 0935 115 lb (52.2 kg)     Height 01/24/19 0935 5\' 1"  (1.549 m)     Head Circumference --      Peak Flow --      Pain Score 01/24/19 0935 0     Pain Loc --      Pain Edu? --      Excl. in [place]? --     Constitutional: Alert and oriented. Well appearing and in no acute distress. Eyes: Conjunctivae are normal. EOMI. Head: Atraumatic. Nose: No congestion/rhinnorhea. Mouth/Throat: Mucous membranes are moist.   Neck: No stridor. Trachea Midline. FROM Cardiovascular: Normal rate, regular rhythm. Grossly normal heart sounds.  Good peripheral circulation. Respiratory: Normal respiratory effort.  No retractions. Lungs CTAB. Gastrointestinal: Slightly distended abdomen with diffuse tenderness.. No distention. No abdominal bruits.  Musculoskeletal: No lower extremity tenderness nor edema.  No joint effusions. Neurologic:  Normal speech and language. No gross focal neurologic deficits are appreciated.  Skin:  Skin is warm, dry and intact. No rash noted. Psychiatric: Mood and affect are normal. Speech and behavior are normal. GU: Deferred   ____________________________________________   LABS (all labs ordered are listed, but only abnormal results are displayed)  Labs Reviewed  CBC - Abnormal; Notable for the following components:      Result Value   WBC 14.1 (*)    All other components within normal limits  COMPREHENSIVE METABOLIC PANEL - Abnormal; Notable for the following components:   Sodium 132 (*)    Chloride 95 (*)    Glucose, Bld 420 (*)    BUN 31 (*)    Creatinine, Ser 1.54 (*)    Total Protein 8.2 (*)    AST 73 (*)    ALT 100 (*)    Alkaline Phosphatase 302 (*)    GFR calc non Af Amer 36 (*)    GFR calc Af Amer 42 (*)    All other components within normal limits  RESPIRATORY PANEL BY RT PCR (FLU A&B, COVID)  URINALYSIS, COMPLETE (UACMP) WITH  MICROSCOPIC   ____________________________________________   ED ECG REPORT I, Karen [name], the attending physician, personally viewed and interpreted this ECG.  EKG is normal sinus rate of 86, no ST elevation, T wave inversion in lead III, normal intervals.  On review of prior EKGs it looks like she is had that T wave inversion in lead III before   ____________________________________________  RADIOLOGY I, Karen [place], personally viewed and evaluated these images (plain radiographs) as part of my medical decision making, as well as reviewing the written report by the radiologist.  ED MD interpretation: Chest x-ray no evidence of pneumonia  Official radiology report(s): DG Chest 2 View  Result Date: 01/24/2019 CLINICAL DATA:  Shortness of breath.  Fever. EXAM: CHEST - 2 VIEW COMPARISON:  September 22, 2018. FINDINGS: The heart size and mediastinal contours are within normal limits. Both lungs are clear. No pneumothorax or pleural effusion is noted. The visualized skeletal structures are unremarkable. IMPRESSION: No active cardiopulmonary disease. Electronically Signed   By: Marijo Conception M.D.   On: 01/24/2019 10:17   CT Head Wo Contrast  Result Date: 01/24/2019 CLINICAL DATA:  Headache, fevers EXAM: CT HEAD WITHOUT CONTRAST TECHNIQUE: Contiguous axial images were obtained from the base of the skull through the vertex without intravenous contrast. COMPARISON:  01/03/2017 FINDINGS: Brain: There is no acute intracranial hemorrhage, mass-effect, or edema. Gray-white differentiation is preserved. There is no extra-axial fluid collection. Ventricles and sulci are within normal limits in size and configuration. Vascular: There is atherosclerotic calcification at the skull base. Skull: Calvarium is unremarkable. Sinuses/Orbits: Patchy paranasal sinus mucosal thickening. Visualized orbits are unremarkable. Other: None. IMPRESSION: No acute intracranial abnormality. Electronically Signed   By:  Macy Mis M.D.   On: 01/24/2019 12:50   CT Angio Chest PE W and/or Wo Contrast  Result Date: 01/24/2019 CLINICAL DATA:  Shortness of breath, abdominal pain, nausea, vomiting, generalized weakness, fevers of 102-104 since yesterday, body aches, dry cough EXAM: CT ANGIOGRAPHY CHEST CT ABDOMEN AND PELVIS WITH CONTRAST TECHNIQUE: Multidetector CT imaging of the chest was performed using the standard protocol during bolus administration of intravenous contrast. Multiplanar CT image reconstructions and MIPs were obtained to evaluate the vascular anatomy. Multidetector CT imaging of the abdomen and pelvis was performed using the standard protocol during bolus administration of intravenous contrast. CONTRAST:  70mL OMNIPAQUE IOHEXOL 350 MG/ML SOLN IV. No oral contrast. COMPARISON:  CT abdomen and  pelvis 09/22/2018, CT chest 05/29/2011 FINDINGS: CTA CHEST FINDINGS Cardiovascular: Scattered atherosclerotic calcifications aorta, proximal great vessels, and coronary arteries. Aorta normal caliber. No aneurysm or dissection. Pulmonary arteries adequately opacified and patent. No evidence of pulmonary embolism. Heart normal size. No pericardial effusion. Mediastinum/Nodes: Absence of RIGHT thyroid lobe. Beam hardening artifacts LEFT thyroid lobe. No thoracic adenopathy. Esophagus unremarkable. Lungs/Pleura: Peribronchial thickening. Minimal scattered atelectasis. No definite acute infiltrate, pleural effusion or pneumothorax. Musculoskeletal: No acute osseous findings. Review of the MIP images confirms the above findings. CT ABDOMEN and PELVIS FINDINGS Hepatobiliary: Nonspecific low-attenuation lesion posterior RIGHT lobe liver superiorly 9 x 8 mm image 11 unchanged. Gallbladder and liver otherwise normal appearance Pancreas: Normal appearance Spleen: Normal appearance Adrenals/Urinary Tract: Adrenal glands, kidneys, and ureters normal appearance. Bladder well distended with minimally thickened wall, nonspecific. No  definite bladder mass. Stomach/Bowel: Appendix reportedly surgically absent though a short normal appearing appendiceal base is identified 1.7 cm length. Stool throughout colon. Scattered colonic diverticula without evidence of diverticulitis. Stomach and bowel loops otherwise normal appearance Vascular/Lymphatic: Atherosclerotic calcifications without aneurysm. No adenopathy. Reproductive: Uterus surgically absent with normal sized ovaries Other: No free air or free fluid.  No acute inflammatory process. Musculoskeletal: Osseous structures unremarkable. Small lipoma of the distal RIGHT iliopsoas. Review of the MIP images confirms the above findings. IMPRESSION: Scattered atherosclerotic calcifications including coronary arteries without aortic aneurysm or dissection. No evidence of pulmonary embolism. Scattered colonic diverticulosis without evidence of diverticulitis. Scattered atelectasis in both lungs. Stable 9 mm nonspecific liver lesion. Aortic Atherosclerosis (ICD10-I70.0). Electronically Signed   By: Lavonia Dana M.D.   On: 01/24/2019 12:54   CT ABDOMEN PELVIS W CONTRAST  Result Date: 01/24/2019 CLINICAL DATA:  Shortness of breath, abdominal pain, nausea, vomiting, generalized weakness, fevers of 102-104 since yesterday, body aches, dry cough EXAM: CT ANGIOGRAPHY CHEST CT ABDOMEN AND PELVIS WITH CONTRAST TECHNIQUE: Multidetector CT imaging of the chest was performed using the standard protocol during bolus administration of intravenous contrast. Multiplanar CT image reconstructions and MIPs were obtained to evaluate the vascular anatomy. Multidetector CT imaging of the abdomen and pelvis was performed using the standard protocol during bolus administration of intravenous contrast. CONTRAST:  54mL OMNIPAQUE IOHEXOL 350 MG/ML SOLN IV. No oral contrast. COMPARISON:  CT abdomen and pelvis 09/22/2018, CT chest 05/29/2011 FINDINGS: CTA CHEST FINDINGS Cardiovascular: Scattered atherosclerotic calcifications  aorta, proximal great vessels, and coronary arteries. Aorta normal caliber. No aneurysm or dissection. Pulmonary arteries adequately opacified and patent. No evidence of pulmonary embolism. Heart normal size. No pericardial effusion. Mediastinum/Nodes: Absence of RIGHT thyroid lobe. Beam hardening artifacts LEFT thyroid lobe. No thoracic adenopathy. Esophagus unremarkable. Lungs/Pleura: Peribronchial thickening. Minimal scattered atelectasis. No definite acute infiltrate, pleural effusion or pneumothorax. Musculoskeletal: No acute osseous findings. Review of the MIP images confirms the above findings. CT ABDOMEN and PELVIS FINDINGS Hepatobiliary: Nonspecific low-attenuation lesion posterior RIGHT lobe liver superiorly 9 x 8 mm image 11 unchanged. Gallbladder and liver otherwise normal appearance Pancreas: Normal appearance Spleen: Normal appearance Adrenals/Urinary Tract: Adrenal glands, kidneys, and ureters normal appearance. Bladder well distended with minimally thickened wall, nonspecific. No definite bladder mass. Stomach/Bowel: Appendix reportedly surgically absent though a short normal appearing appendiceal base is identified 1.7 cm length. Stool throughout colon. Scattered colonic diverticula without evidence of diverticulitis. Stomach and bowel loops otherwise normal appearance Vascular/Lymphatic: Atherosclerotic calcifications without aneurysm. No adenopathy. Reproductive: Uterus surgically absent with normal sized ovaries Other: No free air or free fluid.  No acute inflammatory process. Musculoskeletal: Osseous structures unremarkable. Small lipoma of  the distal RIGHT iliopsoas. Review of the MIP images confirms the above findings. IMPRESSION: Scattered atherosclerotic calcifications including coronary arteries without aortic aneurysm or dissection. No evidence of pulmonary embolism. Scattered colonic diverticulosis without evidence of diverticulitis. Scattered atelectasis in both lungs. Stable 9 mm  nonspecific liver lesion. Aortic Atherosclerosis (ICD10-I70.0). Electronically Signed   By: Lavonia Dana M.D.   On: 01/24/2019 12:54    ____________________________________________   PROCEDURES  Procedure(s) performed (including Critical Care):  Ultrasound ED Peripheral IV (Provider)  Date/Time: 01/24/2019 11:54 AM Performed by: Karen Doniphan, MD Authorized by: Karen Kentland, MD   Procedure details:    Indications: multiple failed IV attempts     Skin Prep: chlorhexidine gluconate     Location:  Left AC   Angiocath:  20 G   Bedside Ultrasound Guided: Yes     Patient tolerated procedure without complications: Yes     Dressing applied: Yes       ____________________________________________   INITIAL IMPRESSION / ASSESSMENT AND PLAN / ED COURSE  INIKI BOUGH was evaluated in Emergency Department on 01/24/2019 for the symptoms described in the history of present illness. She was evaluated in the context of the global COVID-19 pandemic, which necessitated consideration that the patient might be at risk for infection with the SARS-CoV-2 virus that causes COVID-19. Institutional protocols and algorithms that pertain to the evaluation of patients at risk for COVID-19 are in a state of rapid change based on information released by regulatory bodies including the CDC and federal and state organizations. These policies and algorithms were followed during the patient's care in the ED.    Patient is a 64 year old with history of diabetes who comes in with multiple symptoms.  Patient reports to have a fever but on check here x2 patient is afebrile without any fever reducers so have low suspicion that there was a true fever at home but still possible.  Patient does have a white count elevation which could suggest an infection.  Will get Covid swab.  Will get CT abdomen to rule out perforation, SBO, diverticulitis, complications from pancreatitis.  Given were going to do CT scan of her abdomen we  will get a CT PE to make sure is no evidence of pulmonary embolism given her new shortness of breath.  Also get a CT head to shows no evidence of mass in case patient needs to be anticoagulated.  Labs show an elevated white count of 14.1 but no evidence of anemia.  Labs show some signs of slight dehydration with a low sodium and chloride.  Glucose is elevated but there is no evidence of DKA with a normal anion gap.  Her LFTs are also slightly elevated Creatinine is 1.5 which is little bit above her baseline of 1.39.  Reviewed her prior admission back in September and it was very similar where her LFTs and lipase were elevated.  She has never had gallstone pancreatitis.  The CT scan does not show signs of gallbladder issues.  CT scans negative for acute pathology.  Reevaluated patient and her headache is better although she still having some pain and nausea.  Discussed with patient discharge with pain medicine and medicine for UTI versus admission.  Patient felt like she need to be admitted to the hospital given the extent of her symptoms.   ____________________________________________   FINAL CLINICAL IMPRESSION(S) / ED DIAGNOSES   Final diagnoses:  Acute pancreatitis, unspecified complication status, unspecified pancreatitis type  Nausea and vomiting, intractability of vomiting  not specified, unspecified vomiting type      MEDICATIONS GIVEN DURING THIS VISIT:  Medications  fentaNYL (SUBLIMAZE) injection 50 mcg (has no administration in time range)  prochlorperazine (COMPAZINE) injection 10 mg (10 mg Intravenous Given 01/24/19 1203)  diphenhydrAMINE (BENADRYL) injection 12.5 mg (12.5 mg Intravenous Given 01/24/19 1203)  sodium chloride 0.9 % bolus 1,000 mL (1,000 mLs Intravenous New Bag/Given 01/24/19 1206)  cefTRIAXone (ROCEPHIN) 1 g in sodium chloride 0.9 % 100 mL IVPB (1 g Intravenous New Bag/Given 01/24/19 1206)  iohexol (OMNIPAQUE) 350 MG/ML injection 75 mL (75 mLs Intravenous Contrast  Given 01/24/19 1223)     ED Discharge Orders    None       Note:  This document was prepared using Dragon voice recognition software and may include unintentional dictation errors.   Karen Barnett, MD 01/24/19 1339

## 2019-01-24 NOTE — ED Notes (Signed)
Pt transferred to room 37. This nurse observed the patient in NAD, pt A&Ox4, breathing fine, VSS upon arrival to room. Will continue to monitor patient.  No needs stated at this time.

## 2019-01-24 NOTE — ED Notes (Signed)
Iv access attempted by this RN x 2 without success.

## 2019-01-24 NOTE — ED Notes (Signed)
Pt in restroom in lobby.

## 2019-01-24 NOTE — ED Notes (Signed)
Pt otf for imaging 

## 2019-01-24 NOTE — H&P (Signed)
History and Physical    Karen [name] [id number] DOB: 07-16-1955 DOA: 01/24/2019  Referring MD/NP/PA:   PCP: Novella Rob, FNP   Patient coming from:  The patient is coming from home.  At baseline, pt is independent for most of ADL.        Chief Complaint: Nausea, vomiting, abdominal pain, dysuria  HPI: Karen [name] is a 64 y.o. female with medical history significant of hypertension, hyperlipidemia, diabetes mellitus, asthma, GERD, depression, anxiety, DKA, alcoholic pancreatitis, CKD-3, alcohol abuse in remission, who presents with nausea vomiting, abdominal pain and dysuria.  Patient states that she has been having nausea, vomiting, abdominal pain since yesterday.  No diarrhea.  Her abdominal pain is located in the upper abdomen, constant, 8 out of 10 severity, sharp, nonradiating.  Patient also reports fever for 102 yesterday, chills, cough and shortness of breath.  She has a body aches, generalized weakness, denies chest pain.  She also has dysuria, burning on urination and increased urinary frequency.  ED Course: pt was found to have WBC 14.1, lipase 163, positive urinalysis (cloudy appearance, large amount of leukocyte, few bacteria, WBC> 50), negative RVP for Covid 19, alcohol level less than 10, abnormal liver function (ALP 302, AST 73, ALT 100, total bilirubin 0.8), slightly worsening renal function, temperature normal, blood pressure 149/83, heart rate 79, RR 16, oxygen saturation 95% on room air.  Chest x-ray negative.  CT angiogram of chest is negative for PE. CT-abd/pelvis showed bladder well distended with minimally thickened wall. Pt is placed on MedSurg bed for observation.   Review of Systems:   General: has fevers, chills, no body weight gain, has poor appetite, has fatigue HEENT: no blurry vision, hearing changes or sore throat Respiratory: has dyspnea, coughing, no wheezing CV: no chest pain, no palpitations GI: has nausea, vomiting, abdominal pain, no diarrhea,  constipation GU: has dysuria, burning on urination, increased urinary frequency, no hematuria  Ext: no leg edema Neuro: no unilateral weakness, numbness, or tingling, no vision change or hearing loss Skin: no rash, no skin tear. MSK: No muscle spasm, no deformity, no limitation of range of movement in spin Heme: No easy bruising.  Travel history: No recent long distant travel.  Allergy:  Allergies  Allergen Reactions  . Penicillins Anaphylaxis    Has patient had a PCN reaction causing immediate rash, facial/tongue/throat swelling, SOB or lightheadedness with hypotension: Yes Has patient had a PCN reaction causing severe rash involving mucus membranes or skin necrosis: No Has patient had a PCN reaction that required hospitalization No Has patient had a PCN reaction occurring within the last 10 years: No If all of the above answers are "NO", then may proceed with Cephalosporin use.  . Reglan [Metoclopramide] Other (See Comments)    Hypotension, shortness of breath  . Percocet [Oxycodone-Acetaminophen] Rash    Past Medical History:  Diagnosis Date  . Alcoholic pancreatitis   . Asthma   . Depression   . Diabetes mellitus without complication ([place])   . DKA (diabetic ketoacidoses) ([place]) 02/17/2015  . Emphysematous cystitis   . Hypercholesteremia   . Hypertension   . Hypokalemia     Past Surgical History:  Procedure Laterality Date  . ABDOMINAL HYSTERECTOMY  1996  . APPENDECTOMY  1997  . ESOPHAGOGASTRODUODENOSCOPY N/A 11/12/2017   Procedure: ESOPHAGOGASTRODUODENOSCOPY (EGD);  Surgeon: Lin Landsman, MD;  Location: [facility] ENDOSCOPY;  Service: Gastroenterology;  Laterality: N/A;  . ESOPHAGOGASTRODUODENOSCOPY N/A 09/29/2018   Procedure: ESOPHAGOGASTRODUODENOSCOPY (EGD);  Surgeon: Alice Reichert, Benay Pike, MD;  Location: [facility] ENDOSCOPY;  Service: Gastroenterology;  Laterality: N/A;  . ESOPHAGOGASTRODUODENOSCOPY (EGD) WITH PROPOFOL N/A 04/24/2016   Procedure: ESOPHAGOGASTRODUODENOSCOPY  (EGD) WITH PROPOFOL;  Surgeon: [name], MD;  Location: [facility] ENDOSCOPY;  Service: Endoscopy;  Laterality: N/A;  . HAND SURGERY  1988  . THYROID SURGERY  2013    Social History:  reports that she has quit smoking. She has never used smokeless tobacco. She reports previous alcohol use. She reports that she does not use drugs.  Family History:  Family History  Family history unknown: Yes     Prior to Admission medications   Medication Sig Start Date End Date Taking? Authorizing Provider  amLODipine (NORVASC) 5 MG tablet Take 1 tablet (5 mg total) by mouth daily. 09/22/18   Merlyn Lot, MD  atorvastatin (LIPITOR) 40 MG tablet Take 1 tablet (40 mg total) by mouth at bedtime. 09/22/18   Merlyn Lot, MD  cyclobenzaprine (FLEXERIL) 5 MG tablet Take 1 tablet (5 mg total) by mouth 2 (two) times daily. Patient taking differently: Take 5 mg by mouth 2 (two) times daily as needed for muscle spasms.  01/07/17   Dustin Flock, MD  flurazepam [facility]) 15 MG capsule Take 1 capsule (15 mg total) by mouth at bedtime as needed for sleep. 01/07/17   Dustin Flock, MD  gabapentin (NEURONTIN) 100 MG capsule Take 100 mg by mouth 2 (two) times daily. 01/27/18   [provider]  HYDROcodone-acetaminophen (NORCO) 10-325 MG tablet Take 1 tablet by mouth every 6 (six) hours as needed.    [provider]  insulin aspart protamine - aspart (NOVOLOG MIX 70/30 FLEXPEN) (70-30) 100 UNIT/ML FlexPen Inject 0.15 mLs (15 Units total) into the skin 2 (two) times daily. 09/22/18   Merlyn Lot, MD  lipase/protease/amylase (CREON) 36000 UNITS CPEP capsule Take 1 capsule (36,000 Units total) by mouth 2 (two) times daily with a meal. Take this with snacks up to twice a day. 09/22/18   Merlyn Lot, MD  omeprazole (PRILOSEC) 40 MG capsule Take 1 capsule (40 mg total) by mouth 2 (two) times daily for 14 days. 09/22/18 10/06/18  Merlyn Lot, MD  GLUCERNA (GLUCERNA) LIQD Take 237 mLs by mouth 3  (three) times daily between meals. 02/19/15 02/27/15  Bettey Costa, MD    Physical Exam: Vitals:   01/24/19 0938 01/24/19 1100 01/24/19 1200 01/24/19 1701  BP: (!) 171/77 (!) 149/83 (!) 167/81 136/72  Pulse: 87 79 80 88  Resp: 16  20 20   Temp: 98.4 F (36.9 C)     TempSrc: Oral     SpO2: 100% 95% 96% 100%  Weight:      Height:       General: Not in acute distress HEENT:       Eyes: PERRL, EOMI, no scleral icterus.       ENT: No discharge from the ears and nose, no pharynx injection, no tonsillar enlargement.        Neck: No JVD, no bruit, no mass felt. Heme: No neck lymph node enlargement. Cardiac: S1/S2, RRR, No murmurs, No gallops or rubs. Respiratory:  No rales, wheezing, rhonchi or rubs. GI: Soft, nondistended, has tenderness in upper abdomen, no rebound pain, no organomegaly, BS present. GU: No hematuria Ext: No pitting leg edema bilaterally. 2+DP/PT pulse bilaterally. Musculoskeletal: No joint deformities, No joint redness or warmth, no limitation of ROM in spin. Skin: No rashes.  Neuro: Alert, oriented X3, cranial nerves II-XII grossly intact, moves all extremities normally.  Psych: Patient is not psychotic,  no suicidal or hemocidal ideation.  Labs on Admission: I have personally reviewed following labs and imaging studies  CBC: Recent Labs  Lab 01/24/19 0950  WBC 14.1*  HGB 12.3  HCT 37.2  MCV 84.5  PLT [id number]   Basic Metabolic Panel: Recent Labs  Lab 01/24/19 0950  NA 132*  K 4.4  CL 95*  CO2 24  GLUCOSE 420*  BUN 31*  CREATININE 1.54*  CALCIUM 8.9   GFR: Estimated Creatinine Clearance: 28.9 mL/min (A) (by C-G formula based on SCr of 1.54 mg/dL (H)). Liver Function Tests: Recent Labs  Lab 01/24/19 0950  AST 73*  ALT 100*  ALKPHOS 302*  BILITOT 0.8  PROT 8.2*  ALBUMIN 3.8   Recent Labs  Lab 01/24/19 0950  LIPASE 163*   No results for input(s): AMMONIA in the last 168 hours. Coagulation Profile: No results for input(s): INR, PROTIME in the  last 168 hours. Cardiac Enzymes: No results for input(s): CKTOTAL, CKMB, CKMBINDEX, TROPONINI in the last 168 hours. BNP (last 3 results) No results for input(s): PROBNP in the last 8760 hours. HbA1C: No results for input(s): HGBA1C in the last 72 hours. CBG: Recent Labs  Lab 01/24/19 1724  GLUCAP 265*   Lipid Profile: No results for input(s): CHOL, HDL, LDLCALC, TRIG, CHOLHDL, LDLDIRECT in the last 72 hours. Thyroid Function Tests: No results for input(s): TSH, T4TOTAL, FREET4, T3FREE, THYROIDAB in the last 72 hours. Anemia Panel: No results for input(s): VITAMINB12, FOLATE, FERRITIN, TIBC, IRON, RETICCTPCT in the last 72 hours. Urine analysis:    Component Value Date/Time   COLORURINE YELLOW (A) 01/24/2019 1033   APPEARANCEUR CLOUDY (A) 01/24/2019 1033   APPEARANCEUR Hazy 08/11/2013 0729   LABSPEC 1.006 01/24/2019 1033   LABSPEC 1.030 08/11/2013 0729   PHURINE 6.0 01/24/2019 1033   GLUCOSEU >=500 (A) 01/24/2019 1033   GLUCOSEU >=500 08/11/2013 0729   HGBUR NEGATIVE 01/24/2019 1033   BILIRUBINUR NEGATIVE 01/24/2019 1033   BILIRUBINUR Negative 08/11/2013 0729   KETONESUR NEGATIVE 01/24/2019 1033   PROTEINUR 100 (A) 01/24/2019 1033   NITRITE NEGATIVE 01/24/2019 1033   LEUKOCYTESUR LARGE (A) 01/24/2019 1033   LEUKOCYTESUR Negative 08/11/2013 0729   Sepsis Labs: @LABRCNTIP (procalcitonin:4,lacticidven:4) ) Recent Results (from the past 240 hour(s))  Respiratory Panel by RT PCR (Flu A&B, Covid) - Nasopharyngeal Swab     Status: None   Collection Time: 01/24/19 10:33 AM   Specimen: Nasopharyngeal Swab  Result Value Ref Range Status   SARS Coronavirus 2 by RT PCR NEGATIVE NEGATIVE Final    Comment: (NOTE) SARS-CoV-2 target nucleic acids are NOT DETECTED. The SARS-CoV-2 RNA is generally detectable in upper respiratoy specimens during the acute phase of infection. The lowest concentration of SARS-CoV-2 viral copies this assay can detect is 131 copies/mL. A negative result  does not preclude SARS-Cov-2 infection and should not be used as the sole basis for treatment or other patient management decisions. A negative result may occur with  improper specimen collection/handling, submission of specimen other than nasopharyngeal swab, presence of viral mutation(s) within the areas targeted by this assay, and inadequate number of viral copies (<131 copies/mL). A negative result must be combined with clinical observations, patient history, and epidemiological information. The expected result is Negative. Fact Sheet for Patients:  PinkCheek.be Fact Sheet for Healthcare Providers:  GravelBags.it This test is not yet ap proved or cleared by the [place] FDA and  has been authorized for detection and/or diagnosis of SARS-CoV-2 by FDA under an Emergency Use Authorization (EUA). This  EUA will remain  in effect (meaning this test can be used) for the duration of the COVID-19 declaration under Section 564(b)(1) of the Act, 21 U.S.C. section 360bbb-3(b)(1), unless the authorization is terminated or revoked sooner.    Influenza A by PCR NEGATIVE NEGATIVE Final   Influenza B by PCR NEGATIVE NEGATIVE Final    Comment: (NOTE) The Xpert Xpress SARS-CoV-2/FLU/RSV assay is intended as an aid in  the diagnosis of influenza from Nasopharyngeal swab specimens and  should not be used as a sole basis for treatment. Nasal washings and  aspirates are unacceptable for Xpert Xpress SARS-CoV-2/FLU/RSV  testing. Fact Sheet for Patients: PinkCheek.be Fact Sheet for Healthcare Providers: GravelBags.it This test is not yet approved or cleared by the [place] FDA and  has been authorized for detection and/or diagnosis of SARS-CoV-2 by  FDA under an Emergency Use Authorization (EUA). This EUA will remain  in effect (meaning this test can be used) for the duration of  the  Covid-19 declaration under Section 564(b)(1) of the Act, 21  U.S.C. section 360bbb-3(b)(1), unless the authorization is  terminated or revoked. Performed at [facility], [address] [place], [place] [address]      Radiological Exams on Admission: DG Chest 2 View  Result Date: 01/24/2019 CLINICAL DATA:  Shortness of breath.  Fever. EXAM: CHEST - 2 VIEW COMPARISON:  September 22, 2018. FINDINGS: The heart size and mediastinal contours are within normal limits. Both lungs are clear. No pneumothorax or pleural effusion is noted. The visualized skeletal structures are unremarkable. IMPRESSION: No active cardiopulmonary disease. Electronically Signed   By: Marijo Conception M.D.   On: 01/24/2019 10:17   CT Head Wo Contrast  Result Date: 01/24/2019 CLINICAL DATA:  Headache, fevers EXAM: CT HEAD WITHOUT CONTRAST TECHNIQUE: Contiguous axial images were obtained from the base of the skull through the vertex without intravenous contrast. COMPARISON:  01/03/2017 FINDINGS: Brain: There is no acute intracranial hemorrhage, mass-effect, or edema. Gray-white differentiation is preserved. There is no extra-axial fluid collection. Ventricles and sulci are within normal limits in size and configuration. Vascular: There is atherosclerotic calcification at the skull base. Skull: Calvarium is unremarkable. Sinuses/Orbits: Patchy paranasal sinus mucosal thickening. Visualized orbits are unremarkable. Other: None. IMPRESSION: No acute intracranial abnormality. Electronically Signed   By: Macy Mis M.D.   On: 01/24/2019 12:50   CT Angio Chest PE W and/or Wo Contrast  Result Date: 01/24/2019 CLINICAL DATA:  Shortness of breath, abdominal pain, nausea, vomiting, generalized weakness, fevers of 102-104 since yesterday, body aches, dry cough EXAM: CT ANGIOGRAPHY CHEST CT ABDOMEN AND PELVIS WITH CONTRAST TECHNIQUE: Multidetector CT imaging of the chest was performed using the standard protocol during  bolus administration of intravenous contrast. Multiplanar CT image reconstructions and MIPs were obtained to evaluate the vascular anatomy. Multidetector CT imaging of the abdomen and pelvis was performed using the standard protocol during bolus administration of intravenous contrast. CONTRAST:  87mL OMNIPAQUE IOHEXOL 350 MG/ML SOLN IV. No oral contrast. COMPARISON:  CT abdomen and pelvis 09/22/2018, CT chest 05/29/2011 FINDINGS: CTA CHEST FINDINGS Cardiovascular: Scattered atherosclerotic calcifications aorta, proximal great vessels, and coronary arteries. Aorta normal caliber. No aneurysm or dissection. Pulmonary arteries adequately opacified and patent. No evidence of pulmonary embolism. Heart normal size. No pericardial effusion. Mediastinum/Nodes: Absence of RIGHT thyroid lobe. Beam hardening artifacts LEFT thyroid lobe. No thoracic adenopathy. Esophagus unremarkable. Lungs/Pleura: Peribronchial thickening. Minimal scattered atelectasis. No definite acute infiltrate, pleural effusion or pneumothorax. Musculoskeletal: No acute osseous findings. Review of  the MIP images confirms the above findings. CT ABDOMEN and PELVIS FINDINGS Hepatobiliary: Nonspecific low-attenuation lesion posterior RIGHT lobe liver superiorly 9 x 8 mm image 11 unchanged. Gallbladder and liver otherwise normal appearance Pancreas: Normal appearance Spleen: Normal appearance Adrenals/Urinary Tract: Adrenal glands, kidneys, and ureters normal appearance. Bladder well distended with minimally thickened wall, nonspecific. No definite bladder mass. Stomach/Bowel: Appendix reportedly surgically absent though a short normal appearing appendiceal base is identified 1.7 cm length. Stool throughout colon. Scattered colonic diverticula without evidence of diverticulitis. Stomach and bowel loops otherwise normal appearance Vascular/Lymphatic: Atherosclerotic calcifications without aneurysm. No adenopathy. Reproductive: Uterus surgically absent with  normal sized ovaries Other: No free air or free fluid.  No acute inflammatory process. Musculoskeletal: Osseous structures unremarkable. Small lipoma of the distal RIGHT iliopsoas. Review of the MIP images confirms the above findings. IMPRESSION: Scattered atherosclerotic calcifications including coronary arteries without aortic aneurysm or dissection. No evidence of pulmonary embolism. Scattered colonic diverticulosis without evidence of diverticulitis. Scattered atelectasis in both lungs. Stable 9 mm nonspecific liver lesion. Aortic Atherosclerosis (ICD10-I70.0). Electronically Signed   By: Lavonia Dana M.D.   On: 01/24/2019 12:54   CT ABDOMEN PELVIS W CONTRAST  Result Date: 01/24/2019 CLINICAL DATA:  Shortness of breath, abdominal pain, nausea, vomiting, generalized weakness, fevers of 102-104 since yesterday, body aches, dry cough EXAM: CT ANGIOGRAPHY CHEST CT ABDOMEN AND PELVIS WITH CONTRAST TECHNIQUE: Multidetector CT imaging of the chest was performed using the standard protocol during bolus administration of intravenous contrast. Multiplanar CT image reconstructions and MIPs were obtained to evaluate the vascular anatomy. Multidetector CT imaging of the abdomen and pelvis was performed using the standard protocol during bolus administration of intravenous contrast. CONTRAST:  70mL OMNIPAQUE IOHEXOL 350 MG/ML SOLN IV. No oral contrast. COMPARISON:  CT abdomen and pelvis 09/22/2018, CT chest 05/29/2011 FINDINGS: CTA CHEST FINDINGS Cardiovascular: Scattered atherosclerotic calcifications aorta, proximal great vessels, and coronary arteries. Aorta normal caliber. No aneurysm or dissection. Pulmonary arteries adequately opacified and patent. No evidence of pulmonary embolism. Heart normal size. No pericardial effusion. Mediastinum/Nodes: Absence of RIGHT thyroid lobe. Beam hardening artifacts LEFT thyroid lobe. No thoracic adenopathy. Esophagus unremarkable. Lungs/Pleura: Peribronchial thickening. Minimal  scattered atelectasis. No definite acute infiltrate, pleural effusion or pneumothorax. Musculoskeletal: No acute osseous findings. Review of the MIP images confirms the above findings. CT ABDOMEN and PELVIS FINDINGS Hepatobiliary: Nonspecific low-attenuation lesion posterior RIGHT lobe liver superiorly 9 x 8 mm image 11 unchanged. Gallbladder and liver otherwise normal appearance Pancreas: Normal appearance Spleen: Normal appearance Adrenals/Urinary Tract: Adrenal glands, kidneys, and ureters normal appearance. Bladder well distended with minimally thickened wall, nonspecific. No definite bladder mass. Stomach/Bowel: Appendix reportedly surgically absent though a short normal appearing appendiceal base is identified 1.7 cm length. Stool throughout colon. Scattered colonic diverticula without evidence of diverticulitis. Stomach and bowel loops otherwise normal appearance Vascular/Lymphatic: Atherosclerotic calcifications without aneurysm. No adenopathy. Reproductive: Uterus surgically absent with normal sized ovaries Other: No free air or free fluid.  No acute inflammatory process. Musculoskeletal: Osseous structures unremarkable. Small lipoma of the distal RIGHT iliopsoas. Review of the MIP images confirms the above findings. IMPRESSION: Scattered atherosclerotic calcifications including coronary arteries without aortic aneurysm or dissection. No evidence of pulmonary embolism. Scattered colonic diverticulosis without evidence of diverticulitis. Scattered atelectasis in both lungs. Stable 9 mm nonspecific liver lesion. Aortic Atherosclerosis (ICD10-I70.0). Electronically Signed   By: Lavonia Dana M.D.   On: 01/24/2019 12:54     EKG: Independently reviewed.  Sinus rhythm, QTC 435, LAE, early R wave progression,  nonspecific T wave change.  Assessment/Plan Principal Problem:   Pancreatitis, recurrent Active Problems:   UTI (urinary tract infection)   Abnormal LFTs   Hypertension   Hypercholesteremia   Type  II diabetes mellitus with renal manifestations (HCC)   GERD (gastroesophageal reflux disease)   Acute renal failure superimposed on stage 3a chronic kidney disease (HCC)   Cough   Pancreatitis, recurrent: Lipase 163. CT scan did not showed nonspecific low-attenuation lesion posterior RIGHT lobe liver superiorly 9 x 8 mm image 11 unchanged. Gallbladder and liver otherwise normal appearance. Pancreas: Normal appearance.  -will place in med-surg bed for observation -NPO for pancreatitis -IVF: 1LNS and then at 100 cc/hr -prn IV morphine for pain control -prn IV zofran for nausea -check  Triglyceride level -continue Creon  UTI (urinary tract infection): -rocephine -f/u Bx and Ux  Abnormal LFTs: -check [place] panel  HTN:  -Continue home medications: Amlodipine -hydralazine prn  Hypercholesteremia: -Crestor  Type II diabetes mellitus with renal manifestations ([place]): Most recent A1c 10.1, poorly controled. Patient is taking Farxiga, 70/30 insulin at home -will decrease 70/30 insulin dose from 15 to 12 units twice daily -SSI  GERD (gastroesophageal reflux disease): -PPI  Acute renal failure superimposed on stage 3a chronic kidney disease ([place]): Baseline creatinine 1.0-1.3. Her creatinine is 1.54, BUN 31, likely due to prerenal secondary to dehydration. -IV fluid as above -Follow-up with NMP  Cough: Patient reports cough, shortness of breath, fever, chills. Chest x-ray negative. COVID-19 RV RVP test negative. Patient has leukocytosis with WBC 14.1. Potential differential diagnosis is bronchitis. -Bronchodilators, as needed albuterol inhaler -As needed Mucinex -Patient is on Rocephin -Blood culture urine       DVT ppx: SQ Lovenox Code Status: Full code Family Communication: None at bed side.     Disposition Plan:  Anticipate discharge back to previous home environment Consults called:  None Admission status: Med-surg bed for obs   Date of Service 01/24/2019    [facility]   If 7PM-7AM, please contact night-coverage [email or web address] Password [facility] 01/24/2019, 7:09 PM

## 2019-01-24 NOTE — ED Triage Notes (Signed)
Here for generalized weakness and fevers 102-104 since yesterday. C/o body aches and SHOB. Has had dry cough.  Unlabored in triage with VSS. Pt reports this AM her temp was 104. C/o nausea but denies vomiting or diarrhea. No pain.

## 2019-01-24 NOTE — ED Notes (Signed)
Nira Conn, RN attempted IV access x 2 without success, IV team consult placed, Dr. Jari Pigg informed

## 2019-01-25 ENCOUNTER — Other Ambulatory Visit: Payer: Self-pay

## 2019-01-25 ENCOUNTER — Observation Stay: Payer: Medicaid Other

## 2019-01-25 DIAGNOSIS — I129 Hypertensive chronic kidney disease with stage 1 through stage 4 chronic kidney disease, or unspecified chronic kidney disease: Secondary | ICD-10-CM | POA: Diagnosis present

## 2019-01-25 DIAGNOSIS — N179 Acute kidney failure, unspecified: Secondary | ICD-10-CM | POA: Diagnosis present

## 2019-01-25 DIAGNOSIS — K921 Melena: Secondary | ICD-10-CM | POA: Diagnosis present

## 2019-01-25 DIAGNOSIS — K8500 Idiopathic acute pancreatitis without necrosis or infection: Secondary | ICD-10-CM | POA: Diagnosis not present

## 2019-01-25 DIAGNOSIS — R945 Abnormal results of liver function studies: Secondary | ICD-10-CM | POA: Diagnosis present

## 2019-01-25 DIAGNOSIS — I10 Essential (primary) hypertension: Secondary | ICD-10-CM

## 2019-01-25 DIAGNOSIS — K861 Other chronic pancreatitis: Secondary | ICD-10-CM

## 2019-01-25 DIAGNOSIS — M25512 Pain in left shoulder: Secondary | ICD-10-CM

## 2019-01-25 DIAGNOSIS — K5900 Constipation, unspecified: Secondary | ICD-10-CM | POA: Diagnosis present

## 2019-01-25 DIAGNOSIS — K859 Acute pancreatitis without necrosis or infection, unspecified: Secondary | ICD-10-CM

## 2019-01-25 DIAGNOSIS — Z20822 Contact with and (suspected) exposure to covid-19: Secondary | ICD-10-CM | POA: Diagnosis present

## 2019-01-25 DIAGNOSIS — E7800 Pure hypercholesterolemia, unspecified: Secondary | ICD-10-CM | POA: Diagnosis present

## 2019-01-25 DIAGNOSIS — E785 Hyperlipidemia, unspecified: Secondary | ICD-10-CM | POA: Diagnosis present

## 2019-01-25 DIAGNOSIS — G8929 Other chronic pain: Secondary | ICD-10-CM | POA: Diagnosis present

## 2019-01-25 DIAGNOSIS — R1310 Dysphagia, unspecified: Secondary | ICD-10-CM | POA: Diagnosis present

## 2019-01-25 DIAGNOSIS — M7502 Adhesive capsulitis of left shoulder: Secondary | ICD-10-CM

## 2019-01-25 DIAGNOSIS — E1169 Type 2 diabetes mellitus with other specified complication: Secondary | ICD-10-CM

## 2019-01-25 DIAGNOSIS — E11649 Type 2 diabetes mellitus with hypoglycemia without coma: Secondary | ICD-10-CM | POA: Diagnosis not present

## 2019-01-25 DIAGNOSIS — N1831 Chronic kidney disease, stage 3a: Secondary | ICD-10-CM | POA: Diagnosis present

## 2019-01-25 DIAGNOSIS — K219 Gastro-esophageal reflux disease without esophagitis: Secondary | ICD-10-CM | POA: Diagnosis present

## 2019-01-25 DIAGNOSIS — D649 Anemia, unspecified: Secondary | ICD-10-CM | POA: Diagnosis present

## 2019-01-25 DIAGNOSIS — B951 Streptococcus, group B, as the cause of diseases classified elsewhere: Secondary | ICD-10-CM | POA: Diagnosis present

## 2019-01-25 DIAGNOSIS — N1832 Chronic kidney disease, stage 3b: Secondary | ICD-10-CM | POA: Diagnosis present

## 2019-01-25 DIAGNOSIS — E1122 Type 2 diabetes mellitus with diabetic chronic kidney disease: Secondary | ICD-10-CM | POA: Diagnosis present

## 2019-01-25 DIAGNOSIS — N3000 Acute cystitis without hematuria: Secondary | ICD-10-CM | POA: Diagnosis present

## 2019-01-25 DIAGNOSIS — F1011 Alcohol abuse, in remission: Secondary | ICD-10-CM | POA: Diagnosis present

## 2019-01-25 DIAGNOSIS — E860 Dehydration: Secondary | ICD-10-CM | POA: Diagnosis present

## 2019-01-25 LAB — COMPREHENSIVE METABOLIC PANEL
ALT: 70 U/L — ABNORMAL HIGH (ref 0–44)
AST: 31 U/L (ref 15–41)
Albumin: 3.5 g/dL (ref 3.5–5.0)
Alkaline Phosphatase: 232 U/L — ABNORMAL HIGH (ref 38–126)
Anion gap: 7 (ref 5–15)
BUN: 20 mg/dL (ref 8–23)
CO2: 26 mmol/L (ref 22–32)
Calcium: 8.7 mg/dL — ABNORMAL LOW (ref 8.9–10.3)
Chloride: 109 mmol/L (ref 98–111)
Creatinine, Ser: 1.06 mg/dL — ABNORMAL HIGH (ref 0.44–1.00)
GFR calc Af Amer: >60 mL/min (ref >60)
GFR calc non Af Amer: 57 mL/min — ABNORMAL LOW (ref >60)
Glucose, Bld: 135 mg/dL — ABNORMAL HIGH (ref 70–99)
Potassium: 4.1 mmol/L (ref 3.5–5.1)
Sodium: 142 mmol/L (ref 135–145)
Total Bilirubin: 0.9 mg/dL (ref 0.3–1.2)
Total Protein: 7.1 g/dL (ref 6.5–8.1)

## 2019-01-25 LAB — HEPATITIS PANEL, ACUTE
HCV Ab: NONREACTIVE
Hep A IgM: NONREACTIVE
Hep B C IgM: NONREACTIVE
Hepatitis B Surface Ag: NONREACTIVE

## 2019-01-25 LAB — LIPASE, BLOOD: Lipase: 60 U/L — ABNORMAL HIGH (ref 11–51)

## 2019-01-25 LAB — GLUCOSE, CAPILLARY
Glucose-Capillary: 136 mg/dL — ABNORMAL HIGH (ref 70–99)
Glucose-Capillary: 117 mg/dL — ABNORMAL HIGH (ref 70–99)
Glucose-Capillary: 149 mg/dL — ABNORMAL HIGH (ref 70–99)
Glucose-Capillary: 94 mg/dL (ref 70–99)
Glucose-Capillary: 252 mg/dL — ABNORMAL HIGH (ref 70–99)
Glucose-Capillary: 231 mg/dL — ABNORMAL HIGH (ref 70–99)
Glucose-Capillary: 202 mg/dL — ABNORMAL HIGH (ref 70–99)
Glucose-Capillary: 167 mg/dL — ABNORMAL HIGH (ref 70–99)
Glucose-Capillary: 21 mg/dL — CL (ref 70–99)

## 2019-01-25 LAB — URINE CULTURE: Culture: 80000 — AB

## 2019-01-25 MED ORDER — HYDROCODONE-ACETAMINOPHEN 7.5-325 MG PO TABS
1.0000 | ORAL_TABLET | Freq: Four times a day (QID) | ORAL | Status: DC | PRN
  Administered 2019-01-25 – 2019-01-29 (×11): 1 via ORAL
  Filled 2019-01-25 (×11): qty 1

## 2019-01-25 MED ORDER — TRAZODONE HCL 50 MG PO TABS
50.0000 mg | ORAL_TABLET | Freq: Every evening | ORAL | Status: DC | PRN
  Administered 2019-01-25 – 2019-01-28 (×4): 50 mg via ORAL
  Filled 2019-01-25 (×4): qty 1

## 2019-01-25 MED ORDER — HYDROCODONE-ACETAMINOPHEN 7.5-325 MG PO TABS: 1.0000 | ORAL_TABLET | Freq: Four times a day (QID) | ORAL | Status: DC | PRN

## 2019-01-25 MED ORDER — DEXTROSE 50 % IV SOLN
INTRAVENOUS | Status: AC
  Filled 2019-01-25: qty 50

## 2019-01-25 MED ORDER — DEXTROSE 50 % IV SOLN
1.0000 | Freq: Once | INTRAVENOUS | Status: AC
  Administered 2019-01-25: 50 mL via INTRAVENOUS

## 2019-01-25 MED ORDER — SODIUM CHLORIDE 0.9 % IV SOLN
2.0000 g | INTRAVENOUS | Status: DC
  Administered 2019-01-25 – 2019-01-28 (×4): 2 g via INTRAVENOUS
  Filled 2019-01-25: qty 20
  Filled 2019-01-25: qty 2
  Filled 2019-01-25: qty 20
  Filled 2019-01-25 (×2): qty 2

## 2019-01-25 NOTE — Progress Notes (Signed)
Rechecked blood sugar and it was 167, so I held the 0000 dose of insulin. Will recheck again at 0400.  Christene Slates

## 2019-01-25 NOTE — Progress Notes (Signed)
Patient complained of sugar being low. Checked blood sugar and it was 21. Gave patient orange juice, graham crackers, and chocolate ice cream. I also gave her 83mL of dextrose iv. Will recheck in 15 minutes.  Christene Slates

## 2019-01-25 NOTE — ED Notes (Addendum)
ED TO INPATIENT HANDOFF REPORT  ED Nurse Name and Phone #:  Tom RN   S Name/Age/Gender Karen [name] 64 y.o. female Room/Bed: ED37A/ED37A  Code Status   Code Status: Full Code  Home/SNF/Other Home Patient oriented to: self, place, time and situation Is this baseline? Yes   Triage Complete: Triage complete  Chief Complaint Pancreatitis, recurrent [K85.90]  Triage Note Here for generalized weakness and fevers 102-104 since yesterday. C/o body aches and SHOB. Has had dry cough.  Unlabored in triage with VSS. Pt reports this AM her temp was 104. C/o nausea but denies vomiting or diarrhea. No pain.    Allergies Allergies  Allergen Reactions  . Penicillins Anaphylaxis    Has patient had a PCN reaction causing immediate rash, facial/tongue/throat swelling, SOB or lightheadedness with hypotension: Yes Has patient had a PCN reaction causing severe rash involving mucus membranes or skin necrosis: No Has patient had a PCN reaction that required hospitalization No Has patient had a PCN reaction occurring within the last 10 years: No If all of the above answers are "NO", then may proceed with Cephalosporin use.  . Reglan [Metoclopramide] Other (See Comments)    Hypotension, shortness of breath  . Percocet [Oxycodone-Acetaminophen] Rash    Level of Care/Admitting Diagnosis ED Disposition    ED Disposition Condition [facility]: [place] [[id number]]  Level of Care: Med-Surg [16]  Covid Evaluation: Confirmed COVID Negative  Date Laboratory Confirmed COVID Negative: 01/24/2019  Diagnosis: Pancreatitis, recurrent [id number]  Admitting Physician: Ivor [place]  Attending Physician: Ivor Costa [[id number]]       B Medical/Surgery History Past Medical History:  Diagnosis Date  . Alcoholic pancreatitis   . Asthma   . Depression   . Diabetes mellitus without complication ([place])   . DKA (diabetic ketoacidoses) ([place]) 02/17/2015  . Emphysematous  cystitis   . Hypercholesteremia   . Hypertension   . Hypokalemia    Past Surgical History:  Procedure Laterality Date  . ABDOMINAL HYSTERECTOMY  1996  . APPENDECTOMY  1997  . ESOPHAGOGASTRODUODENOSCOPY N/A 11/12/2017   Procedure: ESOPHAGOGASTRODUODENOSCOPY (EGD);  Surgeon: Lin Landsman, MD;  Location: [facility] ENDOSCOPY;  Service: Gastroenterology;  Laterality: N/A;  . ESOPHAGOGASTRODUODENOSCOPY N/A 09/29/2018   Procedure: ESOPHAGOGASTRODUODENOSCOPY (EGD);  Surgeon: Toledo, Benay Pike, MD;  Location: [facility] ENDOSCOPY;  Service: Gastroenterology;  Laterality: N/A;  . ESOPHAGOGASTRODUODENOSCOPY (EGD) WITH PROPOFOL N/A 04/24/2016   Procedure: ESOPHAGOGASTRODUODENOSCOPY (EGD) WITH PROPOFOL;  Surgeon: [name], MD;  Location: [facility] ENDOSCOPY;  Service: Endoscopy;  Laterality: N/A;  . HAND SURGERY  1988  . THYROID SURGERY  2013     A IV Location/Drains/Wounds Patient Lines/Drains/Airways Status   Active Line/Drains/Airways    Name:   Placement date:   Placement time:   Site:   Days:   Peripheral IV 01/24/19 Left Antecubital   01/24/19    1202    Antecubital   1          Intake/Output Last 24 hours No intake or output data in the 24 hours ending 01/25/19 0030  Labs/Imaging Results for orders placed or performed during the hospital encounter of 01/24/19 (from the past 48 hour(s))  CBC     Status: Abnormal   Collection Time: 01/24/19  9:50 AM  Result Value Ref Range   WBC 14.1 (H) 4.0 - 10.5 K/uL   RBC 4.40 3.87 - 5.11 MIL/uL   Hemoglobin 12.3 12.0 - 15.0 g/dL   HCT 37.2 36.0 - 46.0 %  MCV 84.5 80.0 - 100.0 fL   MCH 28.0 26.0 - 34.0 pg   MCHC 33.1 30.0 - 36.0 g/dL   RDW 13.0 11.5 - 15.5 %   Platelets 196 150 - 400 K/uL   nRBC 0.0 0.0 - 0.2 %    Comment: Performed at [facility], [address] [place], [place] [address]  Comprehensive metabolic panel     Status: Abnormal   Collection Time: 01/24/19  9:50 AM  Result Value Ref Range   Sodium 132 (L) 135 - 145  mmol/L   Potassium 4.4 3.5 - 5.1 mmol/L   Chloride 95 (L) 98 - 111 mmol/L   CO2 24 22 - 32 mmol/L   Glucose, Bld 420 (H) 70 - 99 mg/dL   BUN 31 (H) 8 - 23 mg/dL   Creatinine, Ser 1.54 (H) 0.44 - 1.00 mg/dL   Calcium 8.9 8.9 - 10.3 mg/dL   Total Protein 8.2 (H) 6.5 - 8.1 g/dL   Albumin 3.8 3.5 - 5.0 g/dL   AST 73 (H) 15 - 41 U/L   ALT 100 (H) 0 - 44 U/L   Alkaline Phosphatase 302 (H) 38 - 126 U/L   Total Bilirubin 0.8 0.3 - 1.2 mg/dL   GFR calc non Af Amer 36 (L) >60 mL/min   GFR calc Af Amer 42 (L) >60 mL/min   Anion gap 13 5 - 15    Comment: Performed at [facility], [place], [place] [address]  Troponin I (High Sensitivity)     Status: None   Collection Time: 01/24/19  9:50 AM  Result Value Ref Range   Troponin I (High Sensitivity) 6 <18 ng/L    Comment: (NOTE) Elevated high sensitivity troponin I (hsTnI) values and significant  changes across serial measurements may suggest ACS but many other  chronic and acute conditions are known to elevate hsTnI results.  Refer to the "Links" section for chest pain algorithms and additional  guidance. Performed at [facility], [address] [place], [place] [address]   Lipase, blood     Status: Abnormal   Collection Time: 01/24/19  9:50 AM  Result Value Ref Range   Lipase 163 (H) 11 - 51 U/L    Comment: Performed at [facility], [address] [place], [place] [address]  Triglycerides     Status: Abnormal   Collection Time: 01/24/19  9:50 AM  Result Value Ref Range   Triglycerides 195 (H) <150 mg/dL    Comment: Performed at [facility], [address] [place], [place] [address]  Urinalysis, Complete w Microscopic     Status: Abnormal   Collection Time: 01/24/19 10:33 AM  Result Value Ref Range   Color, Urine YELLOW (A) YELLOW   APPearance CLOUDY (A) CLEAR   Specific Gravity, Urine 1.006 1.005 - 1.030   pH 6.0 5.0 - 8.0   Glucose, UA >=500 (A) NEGATIVE mg/dL   Hgb  urine dipstick NEGATIVE NEGATIVE   Bilirubin Urine NEGATIVE NEGATIVE   Ketones, ur NEGATIVE NEGATIVE mg/dL   Protein, ur 100 (A) NEGATIVE mg/dL   Nitrite NEGATIVE NEGATIVE   Leukocytes,Ua LARGE (A) NEGATIVE   RBC / HPF 6-10 0 - 5 RBC/hpf   WBC, UA >50 (H) 0 - 5 WBC/hpf   Bacteria, UA FEW (A) NONE SEEN   Squamous Epithelial / LPF 0-5 0 - 5   WBC Clumps PRESENT    Non Squamous Epithelial PRESENT (A) NONE SEEN  Comment: Performed at [facility], [address] [place], [place] [address]  Respiratory Panel by RT PCR (Flu A&B, Covid) - Nasopharyngeal Swab     Status: None   Collection Time: 01/24/19 10:33 AM   Specimen: Nasopharyngeal Swab  Result Value Ref Range   SARS Coronavirus 2 by RT PCR NEGATIVE NEGATIVE    Comment: (NOTE) SARS-CoV-2 target nucleic acids are NOT DETECTED. The SARS-CoV-2 RNA is generally detectable in upper respiratoy specimens during the acute phase of infection. The lowest concentration of SARS-CoV-2 viral copies this assay can detect is 131 copies/mL. A negative result does not preclude SARS-Cov-2 infection and should not be used as the sole basis for treatment or other patient management decisions. A negative result may occur with  improper specimen collection/handling, submission of specimen other than nasopharyngeal swab, presence of viral mutation(s) within the areas targeted by this assay, and inadequate number of viral copies (<131 copies/mL). A negative result must be combined with clinical observations, patient history, and epidemiological information. The expected result is Negative. Fact Sheet for Patients:  PinkCheek.be Fact Sheet for Healthcare Providers:  GravelBags.it This test is not yet ap proved or cleared by the [place] FDA and  has been authorized for detection and/or diagnosis of SARS-CoV-2 by FDA under an Emergency Use Authorization (EUA). This EUA will remain   in effect (meaning this test can be used) for the duration of the COVID-19 declaration under Section 564(b)(1) of the Act, 21 U.S.C. section 360bbb-3(b)(1), unless the authorization is terminated or revoked sooner.    Influenza A by PCR NEGATIVE NEGATIVE   Influenza B by PCR NEGATIVE NEGATIVE    Comment: (NOTE) The Xpert Xpress SARS-CoV-2/FLU/RSV assay is intended as an aid in  the diagnosis of influenza from Nasopharyngeal swab specimens and  should not be used as a sole basis for treatment. Nasal washings and  aspirates are unacceptable for Xpert Xpress SARS-CoV-2/FLU/RSV  testing. Fact Sheet for Patients: PinkCheek.be Fact Sheet for Healthcare Providers: GravelBags.it This test is not yet approved or cleared by the [place] FDA and  has been authorized for detection and/or diagnosis of SARS-CoV-2 by  FDA under an Emergency Use Authorization (EUA). This EUA will remain  in effect (meaning this test can be used) for the duration of the  Covid-19 declaration under Section 564(b)(1) of the Act, 21  U.S.C. section 360bbb-3(b)(1), unless the authorization is  terminated or revoked. Performed at [facility], [address] [place], [place] [address]   Ethanol     Status: None   Collection Time: 01/24/19  2:02 PM  Result Value Ref Range   Alcohol, Ethyl (B) <10 <10 mg/dL    Comment: (NOTE) Lowest detectable limit for serum alcohol is 10 mg/dL. For medical purposes only. Performed at [facility], [address] [place], [place] [address]   Hemoglobin A1c     Status: Abnormal   Collection Time: 01/24/19  5:00 PM  Result Value Ref Range   Hgb A1c MFr Bld 11.4 (H) 4.8 - 5.6 %    Comment: (NOTE) Pre diabetes:          5.7%-6.4% Diabetes:              >6.4% Glycemic control for   <7.0% adults with diabetes    Mean Plasma Glucose 280.48 mg/dL    Comment: Performed at [facility] [address], [place], [place] [address]  HIV Antibody (routine testing w rflx)  Status: None   Collection Time: 01/24/19  5:00 PM  Result Value Ref Range   HIV Screen 4th Generation wRfx NON REACTIVE NON REACTIVE    Comment: Performed at [facility], [address], [place], [place] [address]  Glucose, capillary     Status: Abnormal   Collection Time: 01/24/19  5:24 PM  Result Value Ref Range   Glucose-Capillary 265 (H) 70 - 99 mg/dL   Comment 1 Notify RN   Glucose, capillary     Status: Abnormal   Collection Time: 01/24/19  7:50 PM  Result Value Ref Range   Glucose-Capillary 224 (H) 70 - 99 mg/dL  Glucose, capillary     Status: Abnormal   Collection Time: 01/24/19 10:18 PM  Result Value Ref Range   Glucose-Capillary 108 (H) 70 - 99 mg/dL   DG Chest 2 View  Result Date: 01/24/2019 CLINICAL DATA:  Shortness of breath.  Fever. EXAM: CHEST - 2 VIEW COMPARISON:  September 22, 2018. FINDINGS: The heart size and mediastinal contours are within normal limits. Both lungs are clear. No pneumothorax or pleural effusion is noted. The visualized skeletal structures are unremarkable. IMPRESSION: No active cardiopulmonary disease. Electronically Signed   By: Marijo Conception M.D.   On: 01/24/2019 10:17   CT Head Wo Contrast  Result Date: 01/24/2019 CLINICAL DATA:  Headache, fevers EXAM: CT HEAD WITHOUT CONTRAST TECHNIQUE: Contiguous axial images were obtained from the base of the skull through the vertex without intravenous contrast. COMPARISON:  01/03/2017 FINDINGS: Brain: There is no acute intracranial hemorrhage, mass-effect, or edema. Gray-white differentiation is preserved. There is no extra-axial fluid collection. Ventricles and sulci are within normal limits in size and configuration. Vascular: There is atherosclerotic calcification at the skull base. Skull: Calvarium is unremarkable. Sinuses/Orbits: Patchy paranasal sinus mucosal thickening. Visualized orbits are unremarkable. Other: None.  IMPRESSION: No acute intracranial abnormality. Electronically Signed   By: Macy Mis M.D.   On: 01/24/2019 12:50   CT Angio Chest PE W and/or Wo Contrast  Result Date: 01/24/2019 CLINICAL DATA:  Shortness of breath, abdominal pain, nausea, vomiting, generalized weakness, fevers of 102-104 since yesterday, body aches, dry cough EXAM: CT ANGIOGRAPHY CHEST CT ABDOMEN AND PELVIS WITH CONTRAST TECHNIQUE: Multidetector CT imaging of the chest was performed using the standard protocol during bolus administration of intravenous contrast. Multiplanar CT image reconstructions and MIPs were obtained to evaluate the vascular anatomy. Multidetector CT imaging of the abdomen and pelvis was performed using the standard protocol during bolus administration of intravenous contrast. CONTRAST:  38mL OMNIPAQUE IOHEXOL 350 MG/ML SOLN IV. No oral contrast. COMPARISON:  CT abdomen and pelvis 09/22/2018, CT chest 05/29/2011 FINDINGS: CTA CHEST FINDINGS Cardiovascular: Scattered atherosclerotic calcifications aorta, proximal great vessels, and coronary arteries. Aorta normal caliber. No aneurysm or dissection. Pulmonary arteries adequately opacified and patent. No evidence of pulmonary embolism. Heart normal size. No pericardial effusion. Mediastinum/Nodes: Absence of RIGHT thyroid lobe. Beam hardening artifacts LEFT thyroid lobe. No thoracic adenopathy. Esophagus unremarkable. Lungs/Pleura: Peribronchial thickening. Minimal scattered atelectasis. No definite acute infiltrate, pleural effusion or pneumothorax. Musculoskeletal: No acute osseous findings. Review of the MIP images confirms the above findings. CT ABDOMEN and PELVIS FINDINGS Hepatobiliary: Nonspecific low-attenuation lesion posterior RIGHT lobe liver superiorly 9 x 8 mm image 11 unchanged. Gallbladder and liver otherwise normal appearance Pancreas: Normal appearance Spleen: Normal appearance Adrenals/Urinary Tract: Adrenal glands, kidneys, and ureters normal appearance.  Bladder well distended with minimally thickened wall, nonspecific. No definite bladder mass. Stomach/Bowel: Appendix reportedly surgically absent though  a short normal appearing appendiceal base is identified 1.7 cm length. Stool throughout colon. Scattered colonic diverticula without evidence of diverticulitis. Stomach and bowel loops otherwise normal appearance Vascular/Lymphatic: Atherosclerotic calcifications without aneurysm. No adenopathy. Reproductive: Uterus surgically absent with normal sized ovaries Other: No free air or free fluid.  No acute inflammatory process. Musculoskeletal: Osseous structures unremarkable. Small lipoma of the distal RIGHT iliopsoas. Review of the MIP images confirms the above findings. IMPRESSION: Scattered atherosclerotic calcifications including coronary arteries without aortic aneurysm or dissection. No evidence of pulmonary embolism. Scattered colonic diverticulosis without evidence of diverticulitis. Scattered atelectasis in both lungs. Stable 9 mm nonspecific liver lesion. Aortic Atherosclerosis (ICD10-I70.0). Electronically Signed   By: Lavonia Dana M.D.   On: 01/24/2019 12:54   CT ABDOMEN PELVIS W CONTRAST  Result Date: 01/24/2019 CLINICAL DATA:  Shortness of breath, abdominal pain, nausea, vomiting, generalized weakness, fevers of 102-104 since yesterday, body aches, dry cough EXAM: CT ANGIOGRAPHY CHEST CT ABDOMEN AND PELVIS WITH CONTRAST TECHNIQUE: Multidetector CT imaging of the chest was performed using the standard protocol during bolus administration of intravenous contrast. Multiplanar CT image reconstructions and MIPs were obtained to evaluate the vascular anatomy. Multidetector CT imaging of the abdomen and pelvis was performed using the standard protocol during bolus administration of intravenous contrast. CONTRAST:  1mL OMNIPAQUE IOHEXOL 350 MG/ML SOLN IV. No oral contrast. COMPARISON:  CT abdomen and pelvis 09/22/2018, CT chest 05/29/2011 FINDINGS: CTA CHEST  FINDINGS Cardiovascular: Scattered atherosclerotic calcifications aorta, proximal great vessels, and coronary arteries. Aorta normal caliber. No aneurysm or dissection. Pulmonary arteries adequately opacified and patent. No evidence of pulmonary embolism. Heart normal size. No pericardial effusion. Mediastinum/Nodes: Absence of RIGHT thyroid lobe. Beam hardening artifacts LEFT thyroid lobe. No thoracic adenopathy. Esophagus unremarkable. Lungs/Pleura: Peribronchial thickening. Minimal scattered atelectasis. No definite acute infiltrate, pleural effusion or pneumothorax. Musculoskeletal: No acute osseous findings. Review of the MIP images confirms the above findings. CT ABDOMEN and PELVIS FINDINGS Hepatobiliary: Nonspecific low-attenuation lesion posterior RIGHT lobe liver superiorly 9 x 8 mm image 11 unchanged. Gallbladder and liver otherwise normal appearance Pancreas: Normal appearance Spleen: Normal appearance Adrenals/Urinary Tract: Adrenal glands, kidneys, and ureters normal appearance. Bladder well distended with minimally thickened wall, nonspecific. No definite bladder mass. Stomach/Bowel: Appendix reportedly surgically absent though a short normal appearing appendiceal base is identified 1.7 cm length. Stool throughout colon. Scattered colonic diverticula without evidence of diverticulitis. Stomach and bowel loops otherwise normal appearance Vascular/Lymphatic: Atherosclerotic calcifications without aneurysm. No adenopathy. Reproductive: Uterus surgically absent with normal sized ovaries Other: No free air or free fluid.  No acute inflammatory process. Musculoskeletal: Osseous structures unremarkable. Small lipoma of the distal RIGHT iliopsoas. Review of the MIP images confirms the above findings. IMPRESSION: Scattered atherosclerotic calcifications including coronary arteries without aortic aneurysm or dissection. No evidence of pulmonary embolism. Scattered colonic diverticulosis without evidence of  diverticulitis. Scattered atelectasis in both lungs. Stable 9 mm nonspecific liver lesion. Aortic Atherosclerosis (ICD10-I70.0). Electronically Signed   By: Lavonia Dana M.D.   On: 01/24/2019 12:54    Pending Labs Unresulted Labs (From admission, onward)    Start     Ordered   01/25/19 0500  Comprehensive metabolic panel  Tomorrow morning,   STAT     01/24/19 1612   01/24/19 2005  Hepatitis panel, acute  Add-on,   AD     01/24/19 2004   01/24/19 1642  CULTURE, BLOOD (ROUTINE X 2) w Reflex to ID Panel  BLOOD CULTURE X 2,   STAT  01/24/19 1641   01/24/19 1104  Urine culture  Add-on,   AD     01/24/19 1105          Vitals/Pain Today's Vitals   01/24/19 1732 01/24/19 1959 01/24/19 2236 01/25/19 0000  BP:  126/78  (!) 118/58  Pulse:  85  77  Resp:  18  18  Temp:      TempSrc:      SpO2:  96%  96%  Weight:      Height:      PainSc: 6   8      Isolation Precautions No active isolations  Medications Medications  0.9 %  sodium chloride infusion ( Intravenous New Bag/Given 01/24/19 1731)  ondansetron (ZOFRAN) injection 4 mg (has no administration in time range)  morphine 2 MG/ML injection 2 mg (has no administration in time range)  cefTRIAXone (ROCEPHIN) 1 g in sodium chloride 0.9 % 100 mL IVPB (has no administration in time range)  lipase/protease/amylase (CREON) capsule 36,000 Units (36,000 Units Oral Given 01/24/19 1726)  amLODipine (NORVASC) tablet 10 mg (10 mg Oral Given 01/24/19 1725)  rosuvastatin (CRESTOR) tablet 20 mg (20 mg Oral Given 01/24/19 2239)  insulin aspart (novoLOG) injection 12 Units (12 Units Subcutaneous Not Given 01/24/19 2221)  pantoprazole (PROTONIX) EC tablet 40 mg (40 mg Oral Given 01/24/19 1725)  cyclobenzaprine (FLEXERIL) tablet 5 mg (has no administration in time range)  insulin aspart (novoLOG) injection 0-15 Units (5 Units Subcutaneous Given 01/24/19 1958)  enoxaparin (LOVENOX) injection 40 mg (40 mg Subcutaneous Given 01/24/19 1725)  hydrALAZINE  (APRESOLINE) tablet 25 mg (has no administration in time range)  pregabalin (LYRICA) capsule 150 mg (150 mg Oral Given 01/24/19 2238)  albuterol (VENTOLIN HFA) 108 (90 Base) MCG/ACT inhaler 2 puff (has no administration in time range)  dextromethorphan-guaiFENesin (MUCINEX DM) 30-600 MG per 12 hr tablet 1 tablet (1 tablet Oral Given 01/24/19 2239)  prochlorperazine (COMPAZINE) injection 10 mg (10 mg Intravenous Given 01/24/19 1203)  diphenhydrAMINE (BENADRYL) injection 12.5 mg (12.5 mg Intravenous Given 01/24/19 1203)  sodium chloride 0.9 % bolus 1,000 mL (0 mLs Intravenous Stopped 01/24/19 1358)  cefTRIAXone (ROCEPHIN) 1 g in sodium chloride 0.9 % 100 mL IVPB (0 g Intravenous Stopped 01/24/19 1236)  iohexol (OMNIPAQUE) 350 MG/ML injection 75 mL (75 mLs Intravenous Contrast Given 01/24/19 1223)  fentaNYL (SUBLIMAZE) injection 50 mcg (50 mcg Intravenous Given 01/24/19 1403)    Mobility walks Low fall risk   Focused Assessments Cardiac Assessment Handoff:    Lab Results  Component Value Date   CKTOTAL 47 11/11/2017   CKMB 1.3 02/10/2012   TROPONINI 0.07 ([place]) 11/12/2017   No results found for: DDIMER Does the Patient currently have chest pain? No     R Recommendations: See Admitting Provider Note  Report given to:  Vladimir Creeks RN on 2C  Additional Notes:

## 2019-01-25 NOTE — Evaluation (Signed)
Occupational Therapy Evaluation Patient Details Name: Karen [name] MRN: [id number] DOB: 1955/09/28 Today's Date: 01/25/2019    History of Present Illness Pt is 64 y/o F with PMH: HTN, HLD, DM, asthma, GERD, depression, anxiety, DKA, alcoholic pancreatitis, CKD3, and alcohol abuse in remission who presented with nausea, vomiting, abdominal pain and dysuria. Pt with recurrent pancreatitis and UTI.   Clinical Impression   Pt was seen for OT evaluation this date. Prior to hospital admission, pt was Indep with ADL mobility, occasionally using SPC for community ambulation and was mostly Indep with self care, but states she has required progressively more assistance with bathing/dressing d/t L UE pain. Pt lives in [facility] with [address] STE with her significant other who works during the day and is not able to assist. Currently pt demonstrates impairments as described below (See OT problem list) which functionally limit her ability to perform ADL/self-care tasks. Pt currently requires MIN A for UB dressing and opening ADL grooming items, completes LB dressing with setup/supv only.  Pt would benefit from skilled OT to address noted impairments and functional limitations (see below for any additional details) in order to maximize independence. Upon hospital discharge, recommend outpt hand therapy to maximize pt's tolerance/independence with self care ADLs and minimize caregiver burden.     Follow Up Recommendations  Outpatient OT(Hand therapy)    Equipment Recommendations  None recommended by OT    Recommendations for Other Services       Precautions / Restrictions Precautions Precautions: Fall Restrictions Weight Bearing Restrictions: No      Mobility Bed Mobility Overal bed mobility: Independent                Transfers Overall transfer level: Modified independent               General transfer comment: used bed rail for support on L side to come to stand    Balance Overall balance  assessment: Mild deficits observed, not formally tested                                         ADL either performed or assessed with clinical judgement   ADL                                         General ADL Comments: Pt able to don socks while seated EOB, completes commode t/f with Supv only and use of grab bar, completes peri care/clothing mgt with Supv. CGA-Supv for fxl mobility throughout room. Appears to be at baseline for Fxl mobility. Requires MIN A for UB dressing. States she has had difficulty opening lids/containers and with UB ADLs for ~1 year since start of L shoulder pain.     Vision Patient Visual Report: No change from baseline       Perception     Praxis      Pertinent Vitals/Pain Pain Assessment: 0-10 Pain Score: 7  Pain Location: L shoulder Pain Descriptors / Indicators: Aching;Tingling Pain Intervention(s): Limited activity within patient's tolerance;Monitored during session     Hand Dominance     Extremity/Trunk Assessment Upper Extremity Assessment Upper Extremity Assessment: RUE deficits/detail;LUE deficits/detail RUE Deficits / Details: hx R hand sx 20+ years ago. Grip 4/5, elbow 4-/5, shoulder 4-/5 LUE Deficits / Details: unkown etiology of L UE  pain. pt completes 1/2 arc of motion for shoulder, completes full arc for elbow, wrist. Elbow MMT 3+/5, Grip 3+/5.   Lower Extremity Assessment Lower Extremity Assessment: Overall WFL for tasks assessed   Cervical / Trunk Assessment Cervical / Trunk Assessment: Normal   Communication Communication Communication: No difficulties   Cognition Arousal/Alertness: Awake/alert Behavior During Therapy: WFL for tasks assessed/performed Overall Cognitive Status: Within Functional Limits for tasks assessed                                     General Comments       Exercises Other Exercises Other Exercises: OT facilitates education re: role of OT in acute  setting and recommendations. Pt verbalized understanding. Other Exercises: Pt asks questions re: community resources for [employer] d/t being diabetic and also curious about her significant other becoming her official caregiver, OT notifies pt of CSW/ Case manager as good resources for these questions and pt verbalized understanding.   Shoulder Instructions      Home Living Family/patient expects to be discharged to:: Private residence Living Arrangements: Spouse/significant other Available Help at Discharge: Family;Available PRN/intermittently(her significant other works during the day and is only available to assist pt at night.) Type of Home: House Home Access: Stairs to enter [employer] of Steps: 2   Home Layout: One level     Bathroom Shower/Tub: Tub/shower unit         Home Equipment: Cane - single point;Grab bars - tub/shower          Prior Functioning/Environment Level of Independence: Needs assistance  Gait / Transfers Assistance Needed: pt states she has SPC that she will sometimes use for fxl mobility outside, uses no AD w/in the home. ADL's / Homemaking Assistance Needed: Pt states she has needed assistance for bathing, dressing, and opening things (water bottles, jars, etc) for at least one year secondary to what she describes as nerve pain of L shoulder.            OT Problem List: Decreased strength;Decreased range of motion;Decreased activity tolerance;Decreased coordination;Impaired UE functional use;Pain      OT Treatment/Interventions: Self-care/ADL training;Therapeutic exercise;Patient/family education;Therapeutic activities    OT Goals(Current goals can be found in the care plan section) Acute Rehab OT Goals Patient Stated Goal: to be able to use my left arm/hand more OT Goal Formulation: With patient Time For Goal Achievement: 02/08/19 Potential to Achieve Goals: Good  OT Frequency: Min 1X/week   Barriers to D/C: Decreased caregiver  support          Co-evaluation              AM-PAC OT "6 Clicks" Daily Activity     Outcome Measure Help from another person eating meals?: None Help from another person taking care of personal grooming?: A Little Help from another person toileting, which includes using toliet, bedpan, or urinal?: None Help from another person bathing (including washing, rinsing, drying)?: A Little Help from another person to put on and taking off regular upper body clothing?: A Little Help from another person to put on and taking off regular lower body clothing?: None 6 Click Score: 21   End of Session Equipment Utilized During Treatment: Gait belt Nurse Communication: Mobility status;Other (comment)(communicated to case manager pt's concerns/questions)  Activity Tolerance: Patient tolerated treatment well Patient left: in bed;with call bell/phone within reach  OT Visit Diagnosis: Unsteadiness on feet (R26.81);Muscle weakness (generalized) (  M62.81)                [id number] OT Time Calculation (min): 29 min Charges:  OT General Charges $OT Visit: 1 Visit OT Evaluation $OT Eval Moderate Complexity: 1 Mod OT Treatments $Self Care/Home Management : 8-22 mins  Gerrianne Scale, MS, OTR/L ascom [phone] 01/25/19, 3:56 PM

## 2019-01-25 NOTE — Consult Note (Signed)
ORTHOPAEDIC CONSULTATION  PATIENT NAME: Karen [name] DOB: 1955/01/30  MRN: [id number]  REQUESTING PHYSICIAN: Loletha Grayer, MD  Chief Complaint: Left shoulder pain and stiffness  HPI: Karen [name] is a 64 y.o. left-hand-dominant female who complains of a several month history of progressive left shoulder pain and limited range of motion.  She does not recall any specific trauma or aggravating event.  She first noticed some "aching" discomfort to the shoulder and upper arm.  She describes progressive limitation with range of motion with marked difficulty in reaching behind her back or over her head.  She denies any significant neck pain.  She denies any radiation of pain down the arm.  She denies any numbness or weakness.  She does have some difficulty sleeping at night due to the pain.  She was apparently evaluated by another physician last year but did not have any additional imaging studies done of the shoulder.  Of note, the patient was admitted for pancreatitis.  Past Medical History:  Diagnosis Date  . Alcoholic pancreatitis   . Asthma   . Depression   . Diabetes mellitus without complication ([place])   . DKA (diabetic ketoacidoses) ([place]) 02/17/2015  . Emphysematous cystitis   . Hypercholesteremia   . Hypertension   . Hypokalemia    Past Surgical History:  Procedure Laterality Date  . ABDOMINAL HYSTERECTOMY  1996  . APPENDECTOMY  1997  . ESOPHAGOGASTRODUODENOSCOPY N/A 11/12/2017   Procedure: ESOPHAGOGASTRODUODENOSCOPY (EGD);  Surgeon: Lin Landsman, MD;  Location: [facility] ENDOSCOPY;  Service: Gastroenterology;  Laterality: N/A;  . ESOPHAGOGASTRODUODENOSCOPY N/A 09/29/2018   Procedure: ESOPHAGOGASTRODUODENOSCOPY (EGD);  Surgeon: Toledo, Benay Pike, MD;  Location: [facility] ENDOSCOPY;  Service: Gastroenterology;  Laterality: N/A;  . ESOPHAGOGASTRODUODENOSCOPY (EGD) WITH PROPOFOL N/A 04/24/2016   Procedure: ESOPHAGOGASTRODUODENOSCOPY (EGD) WITH PROPOFOL;  Surgeon: [name], MD;   Location: [facility] ENDOSCOPY;  Service: Endoscopy;  Laterality: N/A;  . HAND SURGERY  1988  . THYROID SURGERY  2013   Social History   Socioeconomic History  . Marital status: Single    Spouse name: Not on file  . Number of children: Not on file  . Years of education: Not on file  . Highest education level: Not on file  Occupational History  . Occupation: disabled  Tobacco Use  . Smoking status: Former Research scientist (life sciences)  . Smokeless tobacco: Never Used  . Tobacco comment: quit 7 years ago   Substance and Sexual Activity  . Alcohol use: Not Currently    Comment: quit drinking 2 years ago   . Drug use: No  . Sexual activity: Not on file  Other Topics Concern  . Not on file  Social History Narrative  . Not on file   Social Determinants of Health   Financial Resource Strain:   . Difficulty of Paying Living Expenses: Not on file  Food Insecurity:   . Worried About Charity fundraiser in the Last Year: Not on file  . Ran Out of Food in the Last Year: Not on file  Transportation Needs:   . Lack of Transportation (Medical): Not on file  . Lack of Transportation (Non-Medical): Not on file  Physical Activity:   . Days of Exercise per Week: Not on file  . Minutes of Exercise per Session: Not on file  Stress:   . Feeling of Stress : Not on file  Social Connections:   . Frequency of Communication with Friends and Family: Not on file  . Frequency of Social Gatherings with Friends and Family:  Not on file  . Attends Religious Services: Not on file  . Active Member of Clubs or Organizations: Not on file  . Attends Archivist Meetings: Not on file  . Marital Status: Not on file   Family History  Family history unknown: Yes   Allergies  Allergen Reactions  . Penicillins Anaphylaxis    Has patient had a PCN reaction causing immediate rash, facial/tongue/throat swelling, SOB or lightheadedness with hypotension: Yes Has patient had a PCN reaction causing severe rash involving mucus  membranes or skin necrosis: No Has patient had a PCN reaction that required hospitalization No Has patient had a PCN reaction occurring within the last 10 years: No If all of the above answers are "NO", then may proceed with Cephalosporin use.  . Reglan [Metoclopramide] Other (See Comments)    Hypotension, shortness of breath  . Percocet [Oxycodone-Acetaminophen] Rash   Prior to Admission medications   Medication Sig Start Date End Date Taking? Authorizing Provider  amLODipine (NORVASC) 10 MG tablet Take 10 mg by mouth daily.   Yes [provider]  cyclobenzaprine (FLEXERIL) 5 MG tablet Take 1 tablet (5 mg total) by mouth 2 (two) times daily. Patient taking differently: Take 5 mg by mouth 2 (two) times daily as needed for muscle spasms.  01/07/17  Yes Dustin Flock, MD  dapagliflozin propanediol (FARXIGA) 10 MG TABS tablet Take 10 mg by mouth daily.   Yes [provider]  HYDROcodone-acetaminophen (NORCO) 10-325 MG tablet Take 1 tablet by mouth 4 (four) times daily.    Yes [provider]  insulin aspart (NOVOLOG) 100 UNIT/ML injection Inject 15 Units into the skin 2 (two) times daily.   Yes [provider]  omeprazole (PRILOSEC) 40 MG capsule Take 1 capsule (40 mg total) by mouth 2 (two) times daily for 14 days. 09/22/18 01/24/19 Yes Merlyn Lot, MD  pregabalin (LYRICA) 150 MG capsule Take 150 mg by mouth 2 (two) times daily.   Yes [provider]  rosuvastatin (CRESTOR) 20 MG tablet Take 20 mg by mouth daily.   Yes [provider]  lipase/protease/amylase (CREON) 36000 UNITS CPEP capsule Take 1 capsule (36,000 Units total) by mouth 2 (two) times daily with a meal. Take this with snacks up to twice a day. Patient not taking: Reported on 01/24/2019 09/22/18   Merlyn Lot, MD  GLUCERNA [facility]) LIQD Take 237 mLs by mouth 3 (three) times daily between meals. 02/19/15 02/27/15  Bettey Costa, MD   DG Chest 2 View  Result Date:  01/24/2019 CLINICAL DATA:  Shortness of breath.  Fever. EXAM: CHEST - 2 VIEW COMPARISON:  September 22, 2018. FINDINGS: The heart size and mediastinal contours are within normal limits. Both lungs are clear. No pneumothorax or pleural effusion is noted. The visualized skeletal structures are unremarkable. IMPRESSION: No active cardiopulmonary disease. Electronically Signed   By: Marijo Conception M.D.   On: 01/24/2019 10:17   CT Head Wo Contrast  Result Date: 01/24/2019 CLINICAL DATA:  Headache, fevers EXAM: CT HEAD WITHOUT CONTRAST TECHNIQUE: Contiguous axial images were obtained from the base of the skull through the vertex without intravenous contrast. COMPARISON:  01/03/2017 FINDINGS: Brain: There is no acute intracranial hemorrhage, mass-effect, or edema. Gray-white differentiation is preserved. There is no extra-axial fluid collection. Ventricles and sulci are within normal limits in size and configuration. Vascular: There is atherosclerotic calcification at the skull base. Skull: Calvarium is unremarkable. Sinuses/Orbits: Patchy paranasal sinus mucosal thickening. Visualized orbits are unremarkable. Other: None. IMPRESSION: No  acute intracranial abnormality. Electronically Signed   By: Macy Mis M.D.   On: 01/24/2019 12:50   CT Angio Chest PE W and/or Wo Contrast  Result Date: 01/24/2019 CLINICAL DATA:  Shortness of breath, abdominal pain, nausea, vomiting, generalized weakness, fevers of 102-104 since yesterday, body aches, dry cough EXAM: CT ANGIOGRAPHY CHEST CT ABDOMEN AND PELVIS WITH CONTRAST TECHNIQUE: Multidetector CT imaging of the chest was performed using the standard protocol during bolus administration of intravenous contrast. Multiplanar CT image reconstructions and MIPs were obtained to evaluate the vascular anatomy. Multidetector CT imaging of the abdomen and pelvis was performed using the standard protocol during bolus administration of intravenous contrast. CONTRAST:  73mL OMNIPAQUE  IOHEXOL 350 MG/ML SOLN IV. No oral contrast. COMPARISON:  CT abdomen and pelvis 09/22/2018, CT chest 05/29/2011 FINDINGS: CTA CHEST FINDINGS Cardiovascular: Scattered atherosclerotic calcifications aorta, proximal great vessels, and coronary arteries. Aorta normal caliber. No aneurysm or dissection. Pulmonary arteries adequately opacified and patent. No evidence of pulmonary embolism. Heart normal size. No pericardial effusion. Mediastinum/Nodes: Absence of RIGHT thyroid lobe. Beam hardening artifacts LEFT thyroid lobe. No thoracic adenopathy. Esophagus unremarkable. Lungs/Pleura: Peribronchial thickening. Minimal scattered atelectasis. No definite acute infiltrate, pleural effusion or pneumothorax. Musculoskeletal: No acute osseous findings. Review of the MIP images confirms the above findings. CT ABDOMEN and PELVIS FINDINGS Hepatobiliary: Nonspecific low-attenuation lesion posterior RIGHT lobe liver superiorly 9 x 8 mm image 11 unchanged. Gallbladder and liver otherwise normal appearance Pancreas: Normal appearance Spleen: Normal appearance Adrenals/Urinary Tract: Adrenal glands, kidneys, and ureters normal appearance. Bladder well distended with minimally thickened wall, nonspecific. No definite bladder mass. Stomach/Bowel: Appendix reportedly surgically absent though a short normal appearing appendiceal base is identified 1.7 cm length. Stool throughout colon. Scattered colonic diverticula without evidence of diverticulitis. Stomach and bowel loops otherwise normal appearance Vascular/Lymphatic: Atherosclerotic calcifications without aneurysm. No adenopathy. Reproductive: Uterus surgically absent with normal sized ovaries Other: No free air or free fluid.  No acute inflammatory process. Musculoskeletal: Osseous structures unremarkable. Small lipoma of the distal RIGHT iliopsoas. Review of the MIP images confirms the above findings. IMPRESSION: Scattered atherosclerotic calcifications including coronary arteries  without aortic aneurysm or dissection. No evidence of pulmonary embolism. Scattered colonic diverticulosis without evidence of diverticulitis. Scattered atelectasis in both lungs. Stable 9 mm nonspecific liver lesion. Aortic Atherosclerosis (ICD10-I70.0). Electronically Signed   By: Lavonia Dana M.D.   On: 01/24/2019 12:54   CT ABDOMEN PELVIS W CONTRAST  Result Date: 01/24/2019 CLINICAL DATA:  Shortness of breath, abdominal pain, nausea, vomiting, generalized weakness, fevers of 102-104 since yesterday, body aches, dry cough EXAM: CT ANGIOGRAPHY CHEST CT ABDOMEN AND PELVIS WITH CONTRAST TECHNIQUE: Multidetector CT imaging of the chest was performed using the standard protocol during bolus administration of intravenous contrast. Multiplanar CT image reconstructions and MIPs were obtained to evaluate the vascular anatomy. Multidetector CT imaging of the abdomen and pelvis was performed using the standard protocol during bolus administration of intravenous contrast. CONTRAST:  54mL OMNIPAQUE IOHEXOL 350 MG/ML SOLN IV. No oral contrast. COMPARISON:  CT abdomen and pelvis 09/22/2018, CT chest 05/29/2011 FINDINGS: CTA CHEST FINDINGS Cardiovascular: Scattered atherosclerotic calcifications aorta, proximal great vessels, and coronary arteries. Aorta normal caliber. No aneurysm or dissection. Pulmonary arteries adequately opacified and patent. No evidence of pulmonary embolism. Heart normal size. No pericardial effusion. Mediastinum/Nodes: Absence of RIGHT thyroid lobe. Beam hardening artifacts LEFT thyroid lobe. No thoracic adenopathy. Esophagus unremarkable. Lungs/Pleura: Peribronchial thickening. Minimal scattered atelectasis. No definite acute infiltrate, pleural effusion or pneumothorax. Musculoskeletal: No acute osseous findings.  Review of the MIP images confirms the above findings. CT ABDOMEN and PELVIS FINDINGS Hepatobiliary: Nonspecific low-attenuation lesion posterior RIGHT lobe liver superiorly 9 x 8 mm image  11 unchanged. Gallbladder and liver otherwise normal appearance Pancreas: Normal appearance Spleen: Normal appearance Adrenals/Urinary Tract: Adrenal glands, kidneys, and ureters normal appearance. Bladder well distended with minimally thickened wall, nonspecific. No definite bladder mass. Stomach/Bowel: Appendix reportedly surgically absent though a short normal appearing appendiceal base is identified 1.7 cm length. Stool throughout colon. Scattered colonic diverticula without evidence of diverticulitis. Stomach and bowel loops otherwise normal appearance Vascular/Lymphatic: Atherosclerotic calcifications without aneurysm. No adenopathy. Reproductive: Uterus surgically absent with normal sized ovaries Other: No free air or free fluid.  No acute inflammatory process. Musculoskeletal: Osseous structures unremarkable. Small lipoma of the distal RIGHT iliopsoas. Review of the MIP images confirms the above findings. IMPRESSION: Scattered atherosclerotic calcifications including coronary arteries without aortic aneurysm or dissection. No evidence of pulmonary embolism. Scattered colonic diverticulosis without evidence of diverticulitis. Scattered atelectasis in both lungs. Stable 9 mm nonspecific liver lesion. Aortic Atherosclerosis (ICD10-I70.0). Electronically Signed   By: Lavonia Dana M.D.   On: 01/24/2019 12:54   MR SHOULDER LEFT WO CONTRAST  Result Date: 01/25/2019 CLINICAL DATA:  Left shoulder pain and limited range of motion for several months. No known injury. EXAM: MRI OF THE LEFT SHOULDER WITHOUT CONTRAST TECHNIQUE: Multiplanar, multisequence MR imaging of the shoulder was performed. No intravenous contrast was administered. COMPARISON:  None. FINDINGS: Rotator cuff: Intact. Heterogeneously increased T2 signal in the rotator cuff tendons is most notable in the supraspinatus. Muscles:  Normal. No atrophy or focal lesion Biceps long head:  Intact. Acromioclavicular Joint: Minimal osteoarthritis. Type 2  acromion. Small volume of subacromial/subdeltoid fluid noted Glenohumeral Joint: There is thickening and intermediate increased T2 signal in the inferior glenohumeral ligament compatible with adhesive capsulitis. Otherwise negative Labrum:  Intact. Bones:  Negative. Other: None. IMPRESSION: 1. Findings compatible with adhesive capsulitis. 2. Rotator cuff tendinopathy without tear. 3. Minimal acromioclavicular osteoarthritis. 4. Small volume of subacromial/subdeltoid fluid compatible with bursitis. Electronically Signed   By: Inge Rise M.D.   On: 01/25/2019 14:21   US Abdomen Limited RUQ  Result Date: 01/25/2019 CLINICAL DATA:  Abdominal pain EXAM: ULTRASOUND ABDOMEN LIMITED RIGHT UPPER QUADRANT COMPARISON:  CT from the previous day. FINDINGS: Gallbladder: No gallstones or wall thickening visualized. No sonographic Murphy sign noted by sonographer. Common bile duct: Diameter: 5.8 mm. Liver: Small 1 cm hyperechoic lesion is noted along the dome of the liver on the right consistent with a small hemangioma. This corresponds to a hypodense lesion seen on recent CT. Portal vein is patent on color Doppler imaging with normal direction of blood flow towards the liver. Other: None. IMPRESSION: Stable hemangioma within the right lobe of the liver. No other focal abnormality is noted. Electronically Signed   By: Inez Catalina M.D.   On: 01/25/2019 08:09    Positive ROS: All other systems have been reviewed and were otherwise negative with the exception of those mentioned in the HPI and as above.  Physical Exam: General: Well developed, well nourished female seen in no acute distress. HEENT: Atraumatic and normocephalic. Sclera are clear. Extraocular motion is intact. Oropharynx is clear with moist mucosa. Neck: Supple, nontender, good range of motion.  Spurling's test and Lhermitte's sign are negative.   Lungs: Clear to auscultation bilaterally. Cardiovascular: Regular rate and rhythm with normal S1 and S2.  No murmurs. No gallops or rubs. Pedal pulses are palpable bilaterally. No  significant pretibial or ankle edema. Abdomen: Soft, slightly nondistended. Bowel sounds are present. Skin: No lesions in the area of chief complaint Neurologic: Awake, alert, and oriented. Sensory function is grossly intact. Motor strength is felt to be 5 over 5 bilaterally. No clonus or tremor. Good motor coordination. Lymphatic: No axillary or cervical lymphadenopathy  MUSCULOSKELETAL: Examination of the left upper extremity was performed.  Normal shoulder contour was appreciated.  No erythema, ecchymosis, or skin changes to the shoulder or upper arm.  Slight substitution pattern is noted with attempted elevation of the arm above shoulder height.  The patient demonstrates good strength with both internal and external rotation of the shoulder against resistance.  There is tenderness to palpation along the subacromial region as well as along the proximal biceps tendon and deltoid insertion.  There is no appreciable tenderness about the scapula.  No scapular winging is appreciated.  Both active and passive range of motion of the shoulder are limited.  The patient demonstrates approximately 20 degrees of external rotation and 40 degrees of internal rotation with the shoulder positioned at 90 degrees of abduction.  She is unable to reach behind her back.  Sulcus sign is negative.  Examination of the left elbow demonstrates no tenderness to palpation or soft tissue swelling.  Full flexion and extension of the elbow is appreciated.  No tenderness to palpation about the forearm or wrist.  Full pronation and supination is appreciated.  Good grip strength is noted.  Assessment: Adhesive capsulitis of the left shoulder  Plan: The findings were discussed in detail with the patient. Outside records were not obtainable for review.  Findings were consistent with adhesive capsulitis.  I have recommended a course of physical therapy to work on  range of motion and shoulder mobilization.  She was inquiring about use of a sling, but I believe the sling is contraindicated given the working diagnosis.  I will continue to follow with you.  Coralyn Roselli P. Holley Bouche M.D.

## 2019-01-25 NOTE — Progress Notes (Signed)
Patient ID: Karen [name], female   DOB: May 06, 1955, 64 y.o.   MRN: [id number] [facility] PROGRESS NOTE  LAIA SAVIANO [id number] DOB: May 24, 1955 DOA: 01/24/2019 PCP: Novella Rob, FNP  HPI/Subjective: Patient complains of abdominal pain.  7 out of 10 intensity.  Feels a little bit better than when she came in.  Admitted previously for pancreatitis a few times in the past.  Patient also complaining of left shoulder pain and difficulty with moving her left arm.  She states this has been going on for a while.  Objective: Vitals:   01/25/19 0444 01/25/19 0800  BP: 130/78 (!) 147/82  Pulse: 73 86  Resp: 20   Temp: 98.7 F (37.1 C)   SpO2: 99%     Intake/Output Summary (Last 24 hours) at 01/25/2019 1508 Last data filed at 01/25/2019 0615 Gross per 24 hour  Intake 0 ml  Output 200 ml  Net -200 ml   Filed Weights   01/24/19 0935  Weight: 52.2 kg    ROS: Review of Systems  Constitutional: Negative for chills and fever.  Eyes: Negative for blurred vision.  Respiratory: Negative for cough and shortness of breath.   Cardiovascular: Negative for chest pain.  Gastrointestinal: Positive for abdominal pain. Negative for constipation, diarrhea, nausea and vomiting.  Genitourinary: Negative for dysuria.  Musculoskeletal: Positive for joint pain.  Neurological: Negative for dizziness and headaches.   Exam: Physical Exam  Constitutional: She is oriented to person, place, and time.  HENT:  Nose: No mucosal edema.  Mouth/Throat: No oropharyngeal exudate or posterior oropharyngeal edema.  Eyes: Pupils are equal, round, and reactive to light. Conjunctivae, EOM and lids are normal.  Neck: Carotid bruit is not present.  Cardiovascular: S1 normal and S2 normal. Exam reveals no gallop.  No murmur heard. Pulses:      Dorsalis pedis pulses are 2+ on the right side and 2+ on the left side.  Respiratory: No respiratory distress. She has no wheezes. She has no rhonchi. She has no rales.   GI: Soft. Bowel sounds are normal. There is abdominal tenderness.  Musculoskeletal:     Left shoulder: Decreased range of motion.     Right ankle: No swelling.     Left ankle: No swelling.     Comments: Patient with weakness with flexion and internal rotation of left arm.  Lymphadenopathy:    She has no cervical adenopathy.  Neurological: She is alert and oriented to person, place, and time. No cranial nerve deficit.  Skin: Skin is warm. No rash noted. Nails show no clubbing.  Psychiatric: She has a normal mood and affect.      Data Reviewed: Basic Metabolic Panel: Recent Labs  Lab 01/24/19 0950 01/25/19 0547  NA 132* 142  K 4.4 4.1  CL 95* 109  CO2 24 26  GLUCOSE 420* 135*  BUN 31* 20  CREATININE 1.54* 1.06*  CALCIUM 8.9 8.7*   Liver Function Tests: Recent Labs  Lab 01/24/19 0950 01/25/19 0547  AST 73* 31  ALT 100* 70*  ALKPHOS 302* 232*  BILITOT 0.8 0.9  PROT 8.2* 7.1  ALBUMIN 3.8 3.5   Recent Labs  Lab 01/24/19 0950 01/25/19 0547  LIPASE 163* 60*   CBC: Recent Labs  Lab 01/24/19 0950  WBC 14.1*  HGB 12.3  HCT 37.2  MCV 84.5  PLT 196   BNP (last 3 results) Recent Labs    03/12/18 0844  BNP 28.0     CBG: Recent Labs  Lab  01/24/19 2218 01/25/19 0109 01/25/19 0440 01/25/19 0755 01/25/19 1152  GLUCAP 108* 136* 117* 149* 94    Recent Results (from the past 240 hour(s))  Respiratory Panel by RT PCR (Flu A&B, Covid) - Nasopharyngeal Swab     Status: None   Collection Time: 01/24/19 10:33 AM   Specimen: Nasopharyngeal Swab  Result Value Ref Range Status   SARS Coronavirus 2 by RT PCR NEGATIVE NEGATIVE Final    Comment: (NOTE) SARS-CoV-2 target nucleic acids are NOT DETECTED. The SARS-CoV-2 RNA is generally detectable in upper respiratoy specimens during the acute phase of infection. The lowest concentration of SARS-CoV-2 viral copies this assay can detect is 131 copies/mL. A negative result does not preclude SARS-Cov-2 infection and  should not be used as the sole basis for treatment or other patient management decisions. A negative result may occur with  improper specimen collection/handling, submission of specimen other than nasopharyngeal swab, presence of viral mutation(s) within the areas targeted by this assay, and inadequate number of viral copies (<131 copies/mL). A negative result must be combined with clinical observations, patient history, and epidemiological information. The expected result is Negative. Fact Sheet for Patients:  PinkCheek.be Fact Sheet for Healthcare Providers:  GravelBags.it This test is not yet ap proved or cleared by the [place] FDA and  has been authorized for detection and/or diagnosis of SARS-CoV-2 by FDA under an Emergency Use Authorization (EUA). This EUA will remain  in effect (meaning this test can be used) for the duration of the COVID-19 declaration under Section 564(b)(1) of the Act, 21 U.S.C. section 360bbb-3(b)(1), unless the authorization is terminated or revoked sooner.    Influenza A by PCR NEGATIVE NEGATIVE Final   Influenza B by PCR NEGATIVE NEGATIVE Final    Comment: (NOTE) The Xpert Xpress SARS-CoV-2/FLU/RSV assay is intended as an aid in  the diagnosis of influenza from Nasopharyngeal swab specimens and  should not be used as a sole basis for treatment. Nasal washings and  aspirates are unacceptable for Xpert Xpress SARS-CoV-2/FLU/RSV  testing. Fact Sheet for Patients: PinkCheek.be Fact Sheet for Healthcare Providers: GravelBags.it This test is not yet approved or cleared by the [place] FDA and  has been authorized for detection and/or diagnosis of SARS-CoV-2 by  FDA under an Emergency Use Authorization (EUA). This EUA will remain  in effect (meaning this test can be used) for the duration of the  Covid-19 declaration under Section  564(b)(1) of the Act, 21  U.S.C. section 360bbb-3(b)(1), unless the authorization is  terminated or revoked. Performed at [facility], [address], [place], [place] [address]   Urine culture     Status: Abnormal   Collection Time: 01/24/19 10:33 AM   Specimen: Urine, Random  Result Value Ref Range Status   Specimen Description   Final    URINE, RANDOM Performed at [facility], [address], [place], [place] [address]    Special Requests   Final    NONE Performed at [facility], [address] [place], [place] [address]    Culture (A)  Final    80,000 COLONIES/mL GROUP B STREP(S.AGALACTIAE)ISOLATED TESTING AGAINST S. AGALACTIAE NOT ROUTINELY PERFORMED DUE TO PREDICTABILITY OF AMP/PEN/VAN SUSCEPTIBILITY. Performed at [facility], [place] [address], [place], [place] [address]    Report Status 01/25/2019 FINAL  Final  CULTURE, BLOOD (ROUTINE X 2) w Reflex to ID Panel     Status: None (Preliminary result)   Collection Time: 01/24/19  5:01 PM  Specimen: BLOOD  Result Value Ref Range Status   Specimen Description BLOOD LEFT ANTECUBITAL  Final   Special Requests   Final    BOTTLES DRAWN AEROBIC AND ANAEROBIC Blood Culture adequate volume   Culture   Final    NO GROWTH < 12 HOURS Performed at [facility], [address] [place], [place] [address]    Report Status PENDING  Incomplete  CULTURE, BLOOD (ROUTINE X 2) w Reflex to ID Panel     Status: None (Preliminary result)   Collection Time: 01/24/19  5:01 PM   Specimen: BLOOD  Result Value Ref Range Status   Specimen Description BLOOD BLOOD RIGHT WRIST  Final   Special Requests   Final    BOTTLES DRAWN AEROBIC AND ANAEROBIC Blood Culture adequate volume   Culture   Final    NO GROWTH < 12 HOURS Performed at [facility], [address] [place], [place] [address]    Report Status PENDING  Incomplete     Studies: DG Chest 2 View  Result Date:  01/24/2019 CLINICAL DATA:  Shortness of breath.  Fever. EXAM: CHEST - 2 VIEW COMPARISON:  September 22, 2018. FINDINGS: The heart size and mediastinal contours are within normal limits. Both lungs are clear. No pneumothorax or pleural effusion is noted. The visualized skeletal structures are unremarkable. IMPRESSION: No active cardiopulmonary disease. Electronically Signed   By: Marijo Conception M.D.   On: 01/24/2019 10:17   CT Head Wo Contrast  Result Date: 01/24/2019 CLINICAL DATA:  Headache, fevers EXAM: CT HEAD WITHOUT CONTRAST TECHNIQUE: Contiguous axial images were obtained from the base of the skull through the vertex without intravenous contrast. COMPARISON:  01/03/2017 FINDINGS: Brain: There is no acute intracranial hemorrhage, mass-effect, or edema. Gray-white differentiation is preserved. There is no extra-axial fluid collection. Ventricles and sulci are within normal limits in size and configuration. Vascular: There is atherosclerotic calcification at the skull base. Skull: Calvarium is unremarkable. Sinuses/Orbits: Patchy paranasal sinus mucosal thickening. Visualized orbits are unremarkable. Other: None. IMPRESSION: No acute intracranial abnormality. Electronically Signed   By: Macy Mis M.D.   On: 01/24/2019 12:50   CT Angio Chest PE W and/or Wo Contrast  Result Date: 01/24/2019 CLINICAL DATA:  Shortness of breath, abdominal pain, nausea, vomiting, generalized weakness, fevers of 102-104 since yesterday, body aches, dry cough EXAM: CT ANGIOGRAPHY CHEST CT ABDOMEN AND PELVIS WITH CONTRAST TECHNIQUE: Multidetector CT imaging of the chest was performed using the standard protocol during bolus administration of intravenous contrast. Multiplanar CT image reconstructions and MIPs were obtained to evaluate the vascular anatomy. Multidetector CT imaging of the abdomen and pelvis was performed using the standard protocol during bolus administration of intravenous contrast. CONTRAST:  27mL OMNIPAQUE  IOHEXOL 350 MG/ML SOLN IV. No oral contrast. COMPARISON:  CT abdomen and pelvis 09/22/2018, CT chest 05/29/2011 FINDINGS: CTA CHEST FINDINGS Cardiovascular: Scattered atherosclerotic calcifications aorta, proximal great vessels, and coronary arteries. Aorta normal caliber. No aneurysm or dissection. Pulmonary arteries adequately opacified and patent. No evidence of pulmonary embolism. Heart normal size. No pericardial effusion. Mediastinum/Nodes: Absence of RIGHT thyroid lobe. Beam hardening artifacts LEFT thyroid lobe. No thoracic adenopathy. Esophagus unremarkable. Lungs/Pleura: Peribronchial thickening. Minimal scattered atelectasis. No definite acute infiltrate, pleural effusion or pneumothorax. Musculoskeletal: No acute osseous findings. Review of the MIP images confirms the above findings. CT ABDOMEN and PELVIS FINDINGS Hepatobiliary: Nonspecific low-attenuation lesion posterior RIGHT lobe liver superiorly 9 x 8 mm image 11 unchanged. Gallbladder and liver otherwise normal appearance Pancreas: Normal  appearance Spleen: Normal appearance Adrenals/Urinary Tract: Adrenal glands, kidneys, and ureters normal appearance. Bladder well distended with minimally thickened wall, nonspecific. No definite bladder mass. Stomach/Bowel: Appendix reportedly surgically absent though a short normal appearing appendiceal base is identified 1.7 cm length. Stool throughout colon. Scattered colonic diverticula without evidence of diverticulitis. Stomach and bowel loops otherwise normal appearance Vascular/Lymphatic: Atherosclerotic calcifications without aneurysm. No adenopathy. Reproductive: Uterus surgically absent with normal sized ovaries Other: No free air or free fluid.  No acute inflammatory process. Musculoskeletal: Osseous structures unremarkable. Small lipoma of the distal RIGHT iliopsoas. Review of the MIP images confirms the above findings. IMPRESSION: Scattered atherosclerotic calcifications including coronary arteries  without aortic aneurysm or dissection. No evidence of pulmonary embolism. Scattered colonic diverticulosis without evidence of diverticulitis. Scattered atelectasis in both lungs. Stable 9 mm nonspecific liver lesion. Aortic Atherosclerosis (ICD10-I70.0). Electronically Signed   By: Lavonia Dana M.D.   On: 01/24/2019 12:54   CT ABDOMEN PELVIS W CONTRAST  Result Date: 01/24/2019 CLINICAL DATA:  Shortness of breath, abdominal pain, nausea, vomiting, generalized weakness, fevers of 102-104 since yesterday, body aches, dry cough EXAM: CT ANGIOGRAPHY CHEST CT ABDOMEN AND PELVIS WITH CONTRAST TECHNIQUE: Multidetector CT imaging of the chest was performed using the standard protocol during bolus administration of intravenous contrast. Multiplanar CT image reconstructions and MIPs were obtained to evaluate the vascular anatomy. Multidetector CT imaging of the abdomen and pelvis was performed using the standard protocol during bolus administration of intravenous contrast. CONTRAST:  84mL OMNIPAQUE IOHEXOL 350 MG/ML SOLN IV. No oral contrast. COMPARISON:  CT abdomen and pelvis 09/22/2018, CT chest 05/29/2011 FINDINGS: CTA CHEST FINDINGS Cardiovascular: Scattered atherosclerotic calcifications aorta, proximal great vessels, and coronary arteries. Aorta normal caliber. No aneurysm or dissection. Pulmonary arteries adequately opacified and patent. No evidence of pulmonary embolism. Heart normal size. No pericardial effusion. Mediastinum/Nodes: Absence of RIGHT thyroid lobe. Beam hardening artifacts LEFT thyroid lobe. No thoracic adenopathy. Esophagus unremarkable. Lungs/Pleura: Peribronchial thickening. Minimal scattered atelectasis. No definite acute infiltrate, pleural effusion or pneumothorax. Musculoskeletal: No acute osseous findings. Review of the MIP images confirms the above findings. CT ABDOMEN and PELVIS FINDINGS Hepatobiliary: Nonspecific low-attenuation lesion posterior RIGHT lobe liver superiorly 9 x 8 mm image  11 unchanged. Gallbladder and liver otherwise normal appearance Pancreas: Normal appearance Spleen: Normal appearance Adrenals/Urinary Tract: Adrenal glands, kidneys, and ureters normal appearance. Bladder well distended with minimally thickened wall, nonspecific. No definite bladder mass. Stomach/Bowel: Appendix reportedly surgically absent though a short normal appearing appendiceal base is identified 1.7 cm length. Stool throughout colon. Scattered colonic diverticula without evidence of diverticulitis. Stomach and bowel loops otherwise normal appearance Vascular/Lymphatic: Atherosclerotic calcifications without aneurysm. No adenopathy. Reproductive: Uterus surgically absent with normal sized ovaries Other: No free air or free fluid.  No acute inflammatory process. Musculoskeletal: Osseous structures unremarkable. Small lipoma of the distal RIGHT iliopsoas. Review of the MIP images confirms the above findings. IMPRESSION: Scattered atherosclerotic calcifications including coronary arteries without aortic aneurysm or dissection. No evidence of pulmonary embolism. Scattered colonic diverticulosis without evidence of diverticulitis. Scattered atelectasis in both lungs. Stable 9 mm nonspecific liver lesion. Aortic Atherosclerosis (ICD10-I70.0). Electronically Signed   By: Lavonia Dana M.D.   On: 01/24/2019 12:54   MR SHOULDER LEFT WO CONTRAST  Result Date: 01/25/2019 CLINICAL DATA:  Left shoulder pain and limited range of motion for several months. No known injury. EXAM: MRI OF THE LEFT SHOULDER WITHOUT CONTRAST TECHNIQUE: Multiplanar, multisequence MR imaging of the shoulder was performed. No intravenous contrast was administered. COMPARISON:  None. FINDINGS: Rotator cuff: Intact. Heterogeneously increased T2 signal in the rotator cuff tendons is most notable in the supraspinatus. Muscles:  Normal. No atrophy or focal lesion Biceps long head:  Intact. Acromioclavicular Joint: Minimal osteoarthritis. Type 2  acromion. Small volume of subacromial/subdeltoid fluid noted Glenohumeral Joint: There is thickening and intermediate increased T2 signal in the inferior glenohumeral ligament compatible with adhesive capsulitis. Otherwise negative Labrum:  Intact. Bones:  Negative. Other: None. IMPRESSION: 1. Findings compatible with adhesive capsulitis. 2. Rotator cuff tendinopathy without tear. 3. Minimal acromioclavicular osteoarthritis. 4. Small volume of subacromial/subdeltoid fluid compatible with bursitis. Electronically Signed   By: Inge Rise M.D.   On: 01/25/2019 14:21   US Abdomen Limited RUQ  Result Date: 01/25/2019 CLINICAL DATA:  Abdominal pain EXAM: ULTRASOUND ABDOMEN LIMITED RIGHT UPPER QUADRANT COMPARISON:  CT from the previous day. FINDINGS: Gallbladder: No gallstones or wall thickening visualized. No sonographic Murphy sign noted by sonographer. Common bile duct: Diameter: 5.8 mm. Liver: Small 1 cm hyperechoic lesion is noted along the dome of the liver on the right consistent with a small hemangioma. This corresponds to a hypodense lesion seen on recent CT. Portal vein is patent on color Doppler imaging with normal direction of blood flow towards the liver. Other: None. IMPRESSION: Stable hemangioma within the right lobe of the liver. No other focal abnormality is noted. Electronically Signed   By: Inez Catalina M.D.   On: 01/25/2019 08:09    Scheduled Meds: . amLODipine  10 mg Oral Daily  . dextromethorphan-guaiFENesin  1 tablet Oral BID  . enoxaparin (LOVENOX) injection  40 mg Subcutaneous Q24H  . insulin aspart  0-15 Units Subcutaneous Q4H  . insulin aspart  12 Units Subcutaneous BID  . lipase/protease/amylase  36,000 Units Oral BID WC  . pantoprazole  40 mg Oral Daily  . pregabalin  150 mg Oral BID  . rosuvastatin  20 mg Oral Daily   Continuous Infusions: . sodium chloride 100 mL/hr at 01/25/19 0611  . cefTRIAXone (ROCEPHIN)  IV 2 g (01/25/19 1202)    Assessment/Plan:  1. Acute  on chronic pancreatitis.  IV fluid hydration pain control.  Patient's lipase is trending a little bit better.  Advance diet to clear liquid diet today and monitor. 2. Acute cystitis without hematuria.  Patient on Rocephin.  Group B strep growing out a culture. 3. Left shoulder pain.  MRI showing adhesive capsulitis.  We will get orthopedic consultation and occupational therapy consultation. 4. Type 2 diabetes mellitus with hyperlipidemia on sliding scale.  On Crestor.  Holding [place]. 5. Essential hypertension on amlodipine  Code Status:     Code Status Orders  (From admission, onward)         Start     Ordered   01/24/19 1613  Full code  Continuous     01/24/19 1612        Code Status History    Date Active Date Inactive Code Status Order ID Comments User Context   09/25/2018 1442 09/29/2018 2252 Full Code [id number]  Demetrios Loll, MD ED   11/11/2017 1417 11/13/2017 1948 Full Code [id number]  Hillary Bow, MD ED   04/25/2017 1039 04/26/2017 1415 Full Code [id number]  Schuyler Amor, MD ED   01/03/2017 1730 01/07/2017 1531 Full Code [id number]  Idelle Crouch, MD Inpatient   12/10/2016 1201 12/12/2016 1712 Full Code [id number]  Demetrios Loll, MD Inpatient   10/15/2016 2350 10/17/2016 0056 Full Code [id number]  Dustin Flock, MD Inpatient   04/21/2016 0505  04/24/2016 2031 Full Code [id number]  Saundra Shelling, MD Inpatient   12/31/2015 0739 01/01/2016 2008 Full Code [id number]  Saundra Shelling, MD Inpatient   12/10/2015 1109 12/12/2015 1848 Full Code [id number]  Demetrios Loll, MD Inpatient   10/29/2015 1545 11/01/2015 1623 Full Code [id number]  Theodoro Grist, MD Inpatient   02/17/2015 1704 02/19/2015 1804 Full Code [id number]  Nicholes Mango, MD Inpatient   Advance Care Planning Activity     Disposition Plan: Home once tolerating solid food and abdominal pain better  Consultants:  Orthopedic surgery  Time spent: 28 minutes  [place]

## 2019-01-26 ENCOUNTER — Encounter: Payer: Medicaid Other | Admitting: Physical Therapy

## 2019-01-26 DIAGNOSIS — K5900 Constipation, unspecified: Secondary | ICD-10-CM

## 2019-01-26 LAB — GLUCOSE, CAPILLARY
Glucose-Capillary: 147 mg/dL — ABNORMAL HIGH (ref 70–99)
Glucose-Capillary: 225 mg/dL — ABNORMAL HIGH (ref 70–99)
Glucose-Capillary: 146 mg/dL — ABNORMAL HIGH (ref 70–99)
Glucose-Capillary: 258 mg/dL — ABNORMAL HIGH (ref 70–99)
Glucose-Capillary: 223 mg/dL — ABNORMAL HIGH (ref 70–99)
Glucose-Capillary: 324 mg/dL — ABNORMAL HIGH (ref 70–99)

## 2019-01-26 MED ORDER — NYSTATIN 100000 UNIT/GM EX POWD
Freq: Two times a day (BID) | CUTANEOUS | Status: DC
  Filled 2019-01-26: qty 15

## 2019-01-26 MED ORDER — INSULIN DETEMIR 100 UNIT/ML ~~LOC~~ SOLN
6.0000 [IU] | Freq: Every day | SUBCUTANEOUS | Status: DC
  Administered 2019-01-26 – 2019-01-28 (×3): 6 [IU] via SUBCUTANEOUS
  Filled 2019-01-26 (×5): qty 0.06

## 2019-01-26 MED ORDER — INSULIN ASPART 100 UNIT/ML ~~LOC~~ SOLN
0.0000 [IU] | Freq: Three times a day (TID) | SUBCUTANEOUS | Status: DC
  Administered 2019-01-26: 3 [IU] via SUBCUTANEOUS
  Administered 2019-01-27: 1 [IU] via SUBCUTANEOUS
  Administered 2019-01-27: 3 [IU] via SUBCUTANEOUS
  Administered 2019-01-27 – 2019-01-29 (×2): 2 [IU] via SUBCUTANEOUS
  Filled 2019-01-26 (×5): qty 1

## 2019-01-26 MED ORDER — INSULIN ASPART 100 UNIT/ML ~~LOC~~ SOLN
0.0000 [IU] | Freq: Every day | SUBCUTANEOUS | Status: DC
  Administered 2019-01-26: 4 [IU] via SUBCUTANEOUS
  Administered 2019-01-28: 2 [IU] via SUBCUTANEOUS
  Filled 2019-01-26 (×2): qty 1

## 2019-01-26 MED ORDER — LACTULOSE 10 GM/15ML PO SOLN
30.0000 g | Freq: Two times a day (BID) | ORAL | Status: DC
  Administered 2019-01-26: 30 g via ORAL
  Filled 2019-01-26 (×2): qty 60

## 2019-01-26 MED ORDER — LACTULOSE 10 GM/15ML PO SOLN
30.0000 g | Freq: Once | ORAL | Status: AC
  Administered 2019-01-26: 30 g via ORAL
  Filled 2019-01-26: qty 60

## 2019-01-26 MED ORDER — INSULIN ASPART 100 UNIT/ML ~~LOC~~ SOLN: 0.0000 [IU] | Freq: Every day | SUBCUTANEOUS | Status: DC

## 2019-01-26 NOTE — Progress Notes (Signed)
Occupational Therapy Treatment Patient Details Name: Karen [name] MRN: [id number] DOB: 1955/12/31 Today's Date: 01/26/2019    History of present illness Per MD notes: Pt is a 64 y.o. female with medical history significant of hypertension, hyperlipidemia, diabetes mellitus, asthma, GERD, depression, anxiety, DKA, alcoholic pancreatitis, CKD-3, alcohol abuse in remission, who presents with nausea vomiting, abdominal pain, L shoulder pain, and dysuria.  MD assessment includes: Recurrent pancreatitis, UTI, abnormal LTFs, HTN, Type II diabetes mellitus with renal manifestations, Acute renal failure superimposed on stage 3a chronic kidney disease, and L shoulder adhesive capsulitis.   OT comments  Pt seen for OT tx this date. Pt agreeable to participate, reporting 4/10 L shoulder pain at start of session seated EOB. Pt instructed in UB adaptive dressing strategies to minimize L shoulder pain. Pt trialed with overhead sweater and zip up sweater. Pt reporting improvement when using learned strategies, but still increased her L shoulder pain to 6/10 with effort. Pt instructed in AE/DME for improving tub transfers and showering safety with visual aides. Pt verbalized understanding. Based on pt's progress this date and inability to verify if she has reliable transportation, changing OT discharge recommendation to [facility] services. Also recommend a [place] aide. Case manager notified.   Follow Up Recommendations  Home health OT    Equipment Recommendations  Tub/shower bench;Other (comment)(LH sponge, HH showerhead, reacher)    Recommendations for Other Services      Precautions / Restrictions Precautions Precautions: Fall Restrictions Weight Bearing Restrictions: No       Mobility Bed Mobility Overal bed mobility: Independent                Transfers Overall transfer level: Needs assistance Equipment used: None Transfers: Sit to/from Stand Sit to Stand: Supervision         General transfer  comment: Good eccentric and concentric control with transfers    Balance Overall balance assessment: Needs assistance   Sitting balance-Leahy Scale: Normal     Standing balance support: No upper extremity supported;During functional activity;Single extremity supported Standing balance-Leahy Scale: Poor Standing balance comment: Frequent min A to prevent LOB in standing without UE support and with single UE support; min to mod A to prevent LOB during BLE SLS attempts; Posterior LOB in standing with eyes closed                           ADL either performed or assessed with clinical judgement   ADL Overall ADL's : Needs assistance/impaired                 Upper Body Dressing : Minimal assistance;Sitting Upper Body Dressing Details (indicate cue type and reason): Pt instructed in UB adaptive dressing strategies to minimize L shoulder pain. Pt trialed with overhead sweater and zip up sweater. Pt reporting improvement when using learned strategies, but still increased her L shoulder pain.                         Vision Patient Visual Report: No change from baseline     Perception     Praxis      Cognition Arousal/Alertness: Awake/alert Behavior During Therapy: WFL for tasks assessed/performed Overall Cognitive Status: Within Functional Limits for tasks assessed  Exercises Other Exercises Other Exercises: Gait training with [facility] for proper sequencing Other Exercises: Static and dynamic standing balance training without UE support with various foot positions, eyes open/closed, and reaching outside BOS Other Exercises: Pt instructed in tub/shower transfers and AE/DME to improve safety and independence including TTB vs shower chair, HH shower head, long handled sponge   Shoulder Instructions       General Comments      Pertinent Vitals/ Pain       Pain Assessment: 0-10 Pain Score: 6  Pain  Location: L shoulder Pain Descriptors / Indicators: Aching Pain Intervention(s): Limited activity within patient's tolerance;Monitored during session;Repositioned  Home Living Family/patient expects to be discharged to:: Private residence Living Arrangements: Other (Comment)(Boy friend) Available Help at Discharge: Friend(s);Available PRN/intermittently Type of Home: House Home Access: Stairs to enter [employer] of Steps: 2 Entrance Stairs-Rails: None Home Layout: One level     Bathroom Shower/Tub: Tub/shower unit         Home Equipment: Cane - single point;Grab bars - tub/shower          Prior Functioning/Environment Level of Independence: Needs assistance  Gait / Transfers Assistance Needed: Ind amb in home without AD with furniture cruising, [place] in community; pt reports no falls but frequent instability and fear of falling limiting community mobility ADL's / Homemaking Assistance Needed: Pt states she has needed assistance for bathing, dressing, and opening things (water bottles, jars, etc) for at least one year secondary to what she describes as nerve pain of L shoulder.       Frequency  Min 1X/week        Progress Toward Goals  OT Goals(current goals can now be found in the care plan section)  Progress towards OT goals: Progressing toward goals  Acute Rehab OT Goals Patient Stated Goal: Improved balance and use of L arm OT Goal Formulation: With patient Time For Goal Achievement: 02/08/19 Potential to Achieve Goals: Good  Plan Discharge plan needs to be updated;Frequency remains appropriate    Co-evaluation                 AM-PAC OT "6 Clicks" Daily Activity     Outcome Measure   Help from another person eating meals?: None Help from another person taking care of personal grooming?: A Little Help from another person toileting, which includes using toliet, bedpan, or urinal?: None Help from another person bathing (including washing,  rinsing, drying)?: A Little Help from another person to put on and taking off regular upper body clothing?: A Little Help from another person to put on and taking off regular lower body clothing?: None 6 Click Score: 21    End of Session    OT Visit Diagnosis: Unsteadiness on feet (R26.81);Muscle weakness (generalized) (M62.81)   Activity Tolerance Patient tolerated treatment well   Patient Left in bed;with call bell/phone within reach;with bed alarm set   Nurse Communication          Time: [id number] OT Time Calculation (min): 42 min  Charges: OT General Charges $OT Visit: 1 Visit OT Treatments $Self Care/Home Management : 38-52 mins  Jeni Salles, MPH, MS, OTR/L ascom [phone] 01/26/19, 3:19 PM

## 2019-01-26 NOTE — Progress Notes (Signed)
Spoke with NP, [place] regarding reassessment of 12 unit insulin SQ 2 times daily order because patient's blood sugar was 21 after it was given. Blood sugar was 202 before insulin was given in addition to moderate sliding scale every 4 hours. Chart was reviewed and orders changed. Will continue to monitor.  Karen [name]  01/26/2019  2:27 AM

## 2019-01-26 NOTE — Progress Notes (Addendum)
Inpatient Diabetes Program Recommendations  AACE/ADA: New Consensus Statement on Inpatient Glycemic Control (2015)  Target Ranges:  Prepandial:   less than 140 mg/dL      Peak postprandial:   less than 180 mg/dL (1-2 hours)      Critically ill patients:  140 - 180 mg/dL   Lab Results  Component Value Date   GLUCAP 258 (H) 01/26/2019   HGBA1C 11.4 (H) 01/24/2019    Review of Glycemic Control Results for Karen [name], Karen [name] (MRN [id number]) as of 01/26/2019 12:46  Ref. Range 01/25/2019 19:56 01/25/2019 22:55 01/25/2019 23:22 01/26/2019 02:04 01/26/2019 04:24 01/26/2019 07:50 01/26/2019 11:25  Glucose-Capillary Latest Ref Range: 70 - 99 mg/dL 202 (H) Novolog 17 units 21 (LL) 167 (H) 147 (H) 225 (H) Novolog 5 units 146 (H) Novolog 2 units 258 (H) Novolog 8 units   Diabetes history: DM2 Outpatient Diabetes medications: Novolog 70/30 insulin mix 15 units bid ac meals Current orders for Inpatient glycemic control: Novolog moderate correction tid  Inpatient Diabetes Program Recommendations:   Noted patient was originally placed on Novolog 12 units bid. Patient was discharged 09/29/18 on Novolog 70/30 15 units bid ac meals While in the hospital: -Decrease Novolog correction to sensitive 0-9 units tid + hs 0-5 -Add Levemir 6 units bid ( 50% home basal dose= 11 units)  Thank you, Bethena Roys E. Shaia Porath, RN, MSN, CDE  Diabetes Coordinator Inpatient Glycemic Control Team Team Pager [phone] (8am-5pm) 01/26/2019 12:51 PM

## 2019-01-26 NOTE — Progress Notes (Signed)
Patient ID: Karen [name], female   DOB: Jul 30, 1955, 64 y.o.   MRN: [id number] [facility] PROGRESS NOTE  Karen [name] [id number] DOB: 1955-07-13 DOA: 01/24/2019 PCP: Novella Rob, FNP  HPI/Subjective: Patient still with abdominal pain.  She states her head hurts.  Last night had a low sugar and was having sweats and chills. Still having shoulder pain.  She states that she has been constipated.  Objective: Vitals:   01/26/19 0428 01/26/19 0825  BP: 128/75 130/75  Pulse: 80 83  Resp: 16   Temp: 97.6 F (36.4 C)   SpO2: 96%     Intake/Output Summary (Last 24 hours) at 01/26/2019 1355 Last data filed at 01/26/2019 1300 Gross per 24 hour  Intake 3866.29 ml  Output 1200 ml  Net 2666.29 ml   Filed Weights   01/24/19 0935  Weight: 52.2 kg    ROS: Review of Systems  Constitutional: Negative for chills and fever.  Eyes: Negative for blurred vision.  Respiratory: Negative for cough and shortness of breath.   Cardiovascular: Negative for chest pain.  Gastrointestinal: Positive for abdominal pain and constipation. Negative for diarrhea, nausea and vomiting.  Genitourinary: Negative for dysuria.  Musculoskeletal: Positive for joint pain.  Neurological: Negative for dizziness and headaches.   Exam: Physical Exam  Constitutional: She is oriented to person, place, and time.  HENT:  Nose: No mucosal edema.  Mouth/Throat: No oropharyngeal exudate or posterior oropharyngeal edema.  Eyes: Pupils are equal, round, and reactive to light. Conjunctivae, EOM and lids are normal.  Neck: Carotid bruit is not present.  Cardiovascular: S1 normal and S2 normal. Exam reveals no gallop.  No murmur heard. Pulses:      Dorsalis pedis pulses are 2+ on the right side and 2+ on the left side.  Respiratory: No respiratory distress. She has no wheezes. She has no rhonchi. She has no rales.  GI: Soft. Bowel sounds are normal. There is abdominal tenderness.  Musculoskeletal:     Left shoulder:  Decreased range of motion.     Right ankle: No swelling.     Left ankle: No swelling.     Comments: Patient with weakness with flexion and internal rotation of left arm.  Lymphadenopathy:    She has no cervical adenopathy.  Neurological: She is alert and oriented to person, place, and time. No cranial nerve deficit.  Skin: Skin is warm. No rash noted. Nails show no clubbing.  Psychiatric: She has a normal mood and affect.      Data Reviewed: Basic Metabolic Panel: Recent Labs  Lab 01/24/19 0950 01/25/19 0547  NA 132* 142  K 4.4 4.1  CL 95* 109  CO2 24 26  GLUCOSE 420* 135*  BUN 31* 20  CREATININE 1.54* 1.06*  CALCIUM 8.9 8.7*   Liver Function Tests: Recent Labs  Lab 01/24/19 0950 01/25/19 0547  AST 73* 31  ALT 100* 70*  ALKPHOS 302* 232*  BILITOT 0.8 0.9  PROT 8.2* 7.1  ALBUMIN 3.8 3.5   Recent Labs  Lab 01/24/19 0950 01/25/19 0547  LIPASE 163* 60*   CBC: Recent Labs  Lab 01/24/19 0950  WBC 14.1*  HGB 12.3  HCT 37.2  MCV 84.5  PLT 196   BNP (last 3 results) Recent Labs    03/12/18 0844  BNP 28.0     CBG: Recent Labs  Lab 01/25/19 2322 01/26/19 0204 01/26/19 0424 01/26/19 0750 01/26/19 1125  GLUCAP 167* 147* 225* 146* 258*    Recent Results (from  the past 240 hour(s))  Respiratory Panel by RT PCR (Flu A&B, Covid) - Nasopharyngeal Swab     Status: None   Collection Time: 01/24/19 10:33 AM   Specimen: Nasopharyngeal Swab  Result Value Ref Range Status   SARS Coronavirus 2 by RT PCR NEGATIVE NEGATIVE Final    Comment: (NOTE) SARS-CoV-2 target nucleic acids are NOT DETECTED. The SARS-CoV-2 RNA is generally detectable in upper respiratoy specimens during the acute phase of infection. The lowest concentration of SARS-CoV-2 viral copies this assay can detect is 131 copies/mL. A negative result does not preclude SARS-Cov-2 infection and should not be used as the sole basis for treatment or other patient management decisions. A negative  result may occur with  improper specimen collection/handling, submission of specimen other than nasopharyngeal swab, presence of viral mutation(s) within the areas targeted by this assay, and inadequate number of viral copies (<131 copies/mL). A negative result must be combined with clinical observations, patient history, and epidemiological information. The expected result is Negative. Fact Sheet for Patients:  PinkCheek.be Fact Sheet for Healthcare Providers:  GravelBags.it This test is not yet ap proved or cleared by the [place] FDA and  has been authorized for detection and/or diagnosis of SARS-CoV-2 by FDA under an Emergency Use Authorization (EUA). This EUA will remain  in effect (meaning this test can be used) for the duration of the COVID-19 declaration under Section 564(b)(1) of the Act, 21 U.S.C. section 360bbb-3(b)(1), unless the authorization is terminated or revoked sooner.    Influenza A by PCR NEGATIVE NEGATIVE Final   Influenza B by PCR NEGATIVE NEGATIVE Final    Comment: (NOTE) The Xpert Xpress SARS-CoV-2/FLU/RSV assay is intended as an aid in  the diagnosis of influenza from Nasopharyngeal swab specimens and  should not be used as a sole basis for treatment. Nasal washings and  aspirates are unacceptable for Xpert Xpress SARS-CoV-2/FLU/RSV  testing. Fact Sheet for Patients: PinkCheek.be Fact Sheet for Healthcare Providers: GravelBags.it This test is not yet approved or cleared by the [place] FDA and  has been authorized for detection and/or diagnosis of SARS-CoV-2 by  FDA under an Emergency Use Authorization (EUA). This EUA will remain  in effect (meaning this test can be used) for the duration of the  Covid-19 declaration under Section 564(b)(1) of the Act, 21  U.S.C. section 360bbb-3(b)(1), unless the authorization is  terminated or  revoked. Performed at [facility], [address], [place], [place] [address]   Urine culture     Status: Abnormal   Collection Time: 01/24/19 10:33 AM   Specimen: Urine, Random  Result Value Ref Range Status   Specimen Description   Final    URINE, RANDOM Performed at [facility], [address] [place], [place] [address]    Special Requests   Final    NONE Performed at [facility], [address] [place], [place] [address]    Culture (A)  Final    80,000 COLONIES/mL GROUP B STREP(S.AGALACTIAE)ISOLATED TESTING AGAINST S. AGALACTIAE NOT ROUTINELY PERFORMED DUE TO PREDICTABILITY OF AMP/PEN/VAN SUSCEPTIBILITY. Performed at [facility], [place] [address] [place], [place] [address]    Report Status 01/25/2019 FINAL  Final  CULTURE, BLOOD (ROUTINE X 2) w Reflex to ID Panel     Status: None (Preliminary result)   Collection Time: 01/24/19  5:01 PM   Specimen: BLOOD  Result Value Ref Range Status   Specimen Description BLOOD LEFT ANTECUBITAL  Final   Special Requests  Final    BOTTLES DRAWN AEROBIC AND ANAEROBIC Blood Culture adequate volume   Culture   Final    NO GROWTH 2 DAYS Performed at [facility], [address] [place], [place] [address]    Report Status PENDING  Incomplete  CULTURE, BLOOD (ROUTINE X 2) w Reflex to ID Panel     Status: None (Preliminary result)   Collection Time: 01/24/19  5:01 PM   Specimen: BLOOD  Result Value Ref Range Status   Specimen Description BLOOD BLOOD RIGHT WRIST  Final   Special Requests   Final    BOTTLES DRAWN AEROBIC AND ANAEROBIC Blood Culture adequate volume   Culture   Final    NO GROWTH 2 DAYS Performed at [facility], [address] [place], [place] [address]    Report Status PENDING  Incomplete     Studies: MR SHOULDER LEFT WO CONTRAST  Result Date: 01/25/2019 CLINICAL DATA:  Left shoulder pain and limited range of motion for several months. No known injury.  EXAM: MRI OF THE LEFT SHOULDER WITHOUT CONTRAST TECHNIQUE: Multiplanar, multisequence MR imaging of the shoulder was performed. No intravenous contrast was administered. COMPARISON:  None. FINDINGS: Rotator cuff: Intact. Heterogeneously increased T2 signal in the rotator cuff tendons is most notable in the supraspinatus. Muscles:  Normal. No atrophy or focal lesion Biceps long head:  Intact. Acromioclavicular Joint: Minimal osteoarthritis. Type 2 acromion. Small volume of subacromial/subdeltoid fluid noted Glenohumeral Joint: There is thickening and intermediate increased T2 signal in the inferior glenohumeral ligament compatible with adhesive capsulitis. Otherwise negative Labrum:  Intact. Bones:  Negative. Other: None. IMPRESSION: 1. Findings compatible with adhesive capsulitis. 2. Rotator cuff tendinopathy without tear. 3. Minimal acromioclavicular osteoarthritis. 4. Small volume of subacromial/subdeltoid fluid compatible with bursitis. Electronically Signed   By: Inge Rise M.D.   On: 01/25/2019 14:21   US Abdomen Limited RUQ  Result Date: 01/25/2019 CLINICAL DATA:  Abdominal pain EXAM: ULTRASOUND ABDOMEN LIMITED RIGHT UPPER QUADRANT COMPARISON:  CT from the previous day. FINDINGS: Gallbladder: No gallstones or wall thickening visualized. No sonographic Murphy sign noted by sonographer. Common bile duct: Diameter: 5.8 mm. Liver: Small 1 cm hyperechoic lesion is noted along the dome of the liver on the right consistent with a small hemangioma. This corresponds to a hypodense lesion seen on recent CT. Portal vein is patent on color Doppler imaging with normal direction of blood flow towards the liver. Other: None. IMPRESSION: Stable hemangioma within the right lobe of the liver. No other focal abnormality is noted. Electronically Signed   By: Inez Catalina M.D.   On: 01/25/2019 08:09    Scheduled Meds: . amLODipine  10 mg Oral Daily  . dextromethorphan-guaiFENesin  1 tablet Oral BID  . enoxaparin  (LOVENOX) injection  40 mg Subcutaneous Q24H  . insulin aspart  0-5 Units Subcutaneous QHS  . insulin aspart  0-9 Units Subcutaneous TID WC  . insulin detemir  6 Units Subcutaneous QHS  . lipase/protease/amylase  36,000 Units Oral BID WC  . nystatin   Topical BID  . pantoprazole  40 mg Oral Daily  . pregabalin  150 mg Oral BID  . rosuvastatin  20 mg Oral Daily   Continuous Infusions: . sodium chloride 100 mL/hr at 01/26/19 1121  . cefTRIAXone (ROCEPHIN)  IV 2 g (01/26/19 1123)    Assessment/Plan:  1. Acute on chronic pancreatitis.  IV fluid hydration pain control.  Advance diet to full liquid diet today.  Monitor closely.  Pain control with  as needed oral and IV meds. 2. Acute cystitis without hematuria.  Patient on Rocephin.  Group B strep growing out a culture. 3. Left shoulder pain.  MRI showing adhesive capsulitis.  Appreciate orthopedic consultation.  Physical therapy for shoulder range of motion. 4. Type 2 diabetes mellitus with hyperlipidemia.  Patient had a hypoglycemic episode last night.  Patient received sliding scale insulin and on liquid diet.  Case discussed with diabetes coordinator and since sugars are up at this point will start on low-dose Levemir.  Lower the sliding scale coverage. 5. Essential hypertension on amlodipine 6. Constipation.  Lactulose until bowel movement.  Code Status:     Code Status Orders  (From admission, onward)         Start     Ordered   01/24/19 1613  Full code  Continuous     01/24/19 1612        Code Status History    Date Active Date Inactive Code Status Order ID Comments User Context   09/25/2018 1442 09/29/2018 2252 Full Code [id number]  Demetrios Loll, MD ED   11/11/2017 1417 11/13/2017 1948 Full Code [id number]  Hillary Bow, MD ED   04/25/2017 1039 04/26/2017 1415 Full Code [id number]  Schuyler Amor, MD ED   01/03/2017 1730 01/07/2017 1531 Full Code [id number]  Idelle Crouch, MD Inpatient   12/10/2016 1201 12/12/2016 1712 Full Code  [id number]  Demetrios Loll, MD Inpatient   10/15/2016 2350 10/17/2016 0056 Full Code [id number]  Dustin Flock, MD Inpatient   04/21/2016 0505 04/24/2016 2031 Full Code [id number]  Saundra Shelling, MD Inpatient   12/31/2015 0739 01/01/2016 2008 Full Code [id number]  Saundra Shelling, MD Inpatient   12/10/2015 1109 12/12/2015 1848 Full Code [id number]  Demetrios Loll, MD Inpatient   10/29/2015 1545 11/01/2015 1623 Full Code [id number]  Theodoro Grist, MD Inpatient   02/17/2015 1704 02/19/2015 1804 Full Code [id number]  Nicholes Mango, MD Inpatient   Advance Care Planning Activity     Disposition Plan: Patient still having abdominal pain need to monitor on a day-to-day basis.  Consultants:  Orthopedic surgery  Time spent: 27 minutes  [place]

## 2019-01-26 NOTE — TOC Transition Note (Signed)
Transition of Care [facility]) - CM/SW Discharge Note   Patient Details  Name: Karen [name] MRN: [id number] Date of Birth: 1955/01/07  Transition of Care [facility]) CM/SW Contact:  Elease Hashimoto, LCSW Phone Number: 01/26/2019, 1:58 PM   Clinical Narrative:   Met with pt who is wanting to speak with the CSW regarding services at home. She is wanting what her neighbor gets she gets ensure delievered to her home weekly. When asked if she asked her how her neighbor gets this she voiced her neighbor has passed away. She will follow up on this with her mobile meals person. She lives with her boyfriend who works during the day but is there in the evenings and nights. She can be independent with a rolling walker and does want this worker to get her one. She is agreeable to [place] also. She has no preference of agency. Pt has a niece who has been helping her with her bathing and dressing due to her shoulder issue. Niece is telling pt to get home health care so can get an aide but will take 30 days. Niece is talking about [facility] which provides 20 hours per week if pt qualifies with her medicaid coverage, but needs to meet criteria for this program. Niece to help her with this. She feels ready to go home when the MD feels she is ready.  Rolling walker given to Brad-MD to put order in. HHPT and HHOT ordered via [facility]. MD to put orders in    Final next level of care: Home w Home Health Services Barriers to Discharge: Continued Medical Work up   Patient Goals and CMS Choice Patient states their goals for this hospitalization and ongoing recovery are:: Want to get  better and go home and out of the hospital   Choice offered to / list presented to : Patient  Discharge Placement                       Discharge Plan and Services In-house Referral: Clinical Social Work Discharge Planning Services: NA Post Acute Care Choice: Home Health, Durable Medical Equipment          DME  Arranged: Walker rolling DME Agency: [employer] Date DME Agency Contacted: 01/26/19 Time DME Agency Contacted: 0510 Representative spoke with at DME Agency: [place]: PT, OT [place] Agency: [facility] [place] Date [place]: 01/26/19 Time [place]: 1357 Representative spoke with at [place]: [place] ([place]) Interventions     Readmission Risk Interventions No flowsheet data found.

## 2019-01-26 NOTE — Evaluation (Signed)
Physical Therapy Evaluation Patient Details Name: Karen [name] MRN: [id number] DOB: [birth year] Today's Date: 01/26/2019   History of Present Illness  Per MD notes: Pt is a 64 y.o. female with medical history significant of hypertension, hyperlipidemia, diabetes mellitus, asthma, GERD, depression, anxiety, DKA, alcoholic pancreatitis, CKD-3, alcohol abuse in remission, who presents with nausea vomiting, abdominal pain, L shoulder pain, and dysuria.  MD assessment includes: Recurrent pancreatitis, UTI, abnormal LTFs, HTN, Type II diabetes mellitus with renal manifestations, Acute renal failure superimposed on stage 3a chronic kidney disease, and L shoulder adhesive capsulitis.    Clinical Impression  Pt presented with 5/10 L shoulder pain with deficits in Left shoulder A/PROM per below.  Pt reported no falls recently but that she frequently reaches for furniture at home to prevent LOB during amb.  Pt required frequent min A to prevent LOB during amb both with no AD and with SPC but was steady with the RW.  Pt encouraged to use a RW at home for safety until instructed otherwise by HHPT.  Pt will benefit from HHPT services upon discharge to safely address deficits listed in patient problem list for decreased caregiver assistance and eventual return to PLOF.      Follow Up Recommendations Home health PT;Supervision - Intermittent    Equipment Recommendations  Rolling walker with 5" wheels    Recommendations for Other Services       Precautions / Restrictions Precautions Precautions: Fall Restrictions Weight Bearing Restrictions: No      Mobility  Bed Mobility Overal bed mobility: Independent                Transfers Overall transfer level: Needs assistance Equipment used: None Transfers: Sit to/from Stand Sit to Stand: Supervision         General transfer comment: Good eccentric and concentric control with transfers  Ambulation/Gait Ambulation/Gait assistance: Min  assist Gait Distance (Feet): 200 Feet x 2, 150 x 1 Assistive device: Rolling walker (2 wheeled);None;Straight cane Gait Pattern/deviations: Step-through pattern;Decreased step length - right;Decreased step length - left;Drifts right/left;Staggering left;Staggering right Gait velocity: decreased   General Gait Details: Pt occasional staggering left/right during amb without an AD and with a SPC with frequent min A to prevent LOB, fair to good stability with a RW;   Science writer    Modified Rankin (Stroke Patients Only)       Balance Overall balance assessment: Needs assistance   Sitting balance-Leahy Scale: Normal     Standing balance support: No upper extremity supported;During functional activity;Single extremity supported Standing balance-Leahy Scale: Poor Standing balance comment: Frequent min A to prevent LOB in standing without UE support and with single UE support; min to mod A to prevent LOB during BLE SLS attempts; Posterior LOB in standing with eyes closed                             Pertinent Vitals/Pain Pain Assessment: 0-10 Pain Score: 5  Pain Location: L shoulder Pain Descriptors / Indicators: Aching Pain Intervention(s): Premedicated before session;Monitored during session    [place] expects to be discharged to:: Private residence Living Arrangements: Other (Comment)(Boy friend) Available Help at Discharge: Friend(s);Available PRN/intermittently Type of Home: House Home Access: Stairs to enter Entrance Stairs-Rails: None Entrance Stairs-Number of Steps: 2 Home Layout: One level Home Equipment: Cane - single point;Grab bars - tub/shower  Prior Function Level of Independence: Needs assistance   Gait / Transfers Assistance Needed: Ind amb in home without AD with furniture cruising, [place] in community; pt reports no falls but frequent instability and fear of falling limiting community  mobility  ADL's / Homemaking Assistance Needed: Pt states she has needed assistance for bathing, dressing, and opening things (water bottles, jars, etc) for at least one year secondary to what she describes as nerve pain of L shoulder.        Hand Dominance        Extremity/Trunk Assessment   Upper Extremity Assessment Upper Extremity Assessment: Generalized weakness;LUE deficits/detail LUE Deficits / Details: L shoulder PROM: flex 90 deg, abd 85 deg, ER 20 deg LUE: Unable to fully assess due to pain    Lower Extremity Assessment Lower Extremity Assessment: Generalized weakness       Communication   Communication: No difficulties  Cognition Arousal/Alertness: Awake/alert Behavior During Therapy: WFL for tasks assessed/performed Overall Cognitive Status: Within Functional Limits for tasks assessed                                        General Comments      Exercises Other Exercises Other Exercises: Gait training with [facility] for proper sequencing Other Exercises: Static and dynamic standing balance training without UE support with various foot positions, eyes open/closed, and reaching outside BOS   Assessment/Plan    PT Assessment Patient needs continued PT services  PT Problem List Decreased strength;Decreased activity tolerance;Decreased balance;Decreased knowledge of use of DME       PT Treatment Interventions Gait training;DME instruction;Stair training;Functional mobility training;Therapeutic activities;Therapeutic exercise;Balance training;Patient/family education    PT Goals (Current goals can be found in the Care Plan section)  Acute Rehab PT Goals Patient Stated Goal: Improved balance and use of L arm PT Goal Formulation: With patient Time For Goal Achievement: 02/08/19 Potential to Achieve Goals: Good    Frequency Min 2X/week   Barriers to discharge        Co-evaluation               AM-PAC PT "6 Clicks" Mobility  Outcome  Measure Help needed turning from your back to your side while in a flat bed without using bedrails?: None Help needed moving from lying on your back to sitting on the side of a flat bed without using bedrails?: None Help needed moving to and from a bed to a chair (including a wheelchair)?: A Little Help needed standing up from a chair using your arms (e.g., wheelchair or bedside chair)?: A Little Help needed to walk in hospital room?: A Little Help needed climbing 3-5 steps with a railing? : A Little 6 Click Score: 20    End of Session Equipment Utilized During Treatment: Gait belt Activity Tolerance: Patient tolerated treatment well Patient left: in chair;with call bell/phone within reach Nurse Communication: Mobility status PT Visit Diagnosis: Unsteadiness on feet (R26.81);Difficulty in walking, not elsewhere classified (R26.2);Muscle weakness (generalized) (M62.81)    Time: [id number] PT Time Calculation (min) (ACUTE ONLY): 37 min   Charges:   PT Evaluation $PT Eval Moderate Complexity: 1 Mod PT Treatments $Gait Training: 8-22 mins $Therapeutic Exercise: 8-22 mins        D. Royetta Asal PT, DPT 01/26/19, 12:44 PM

## 2019-01-27 ENCOUNTER — Inpatient Hospital Stay: Payer: Medicaid Other

## 2019-01-27 LAB — COMPREHENSIVE METABOLIC PANEL
ALT: 51 U/L — ABNORMAL HIGH (ref 0–44)
AST: 34 U/L (ref 15–41)
Albumin: 3.3 g/dL — ABNORMAL LOW (ref 3.5–5.0)
Alkaline Phosphatase: 225 U/L — ABNORMAL HIGH (ref 38–126)
Anion gap: 8 (ref 5–15)
BUN: 10 mg/dL (ref 8–23)
CO2: 24 mmol/L (ref 22–32)
Calcium: 8.5 mg/dL — ABNORMAL LOW (ref 8.9–10.3)
Chloride: 111 mmol/L (ref 98–111)
Creatinine, Ser: 1.06 mg/dL — ABNORMAL HIGH (ref 0.44–1.00)
GFR calc Af Amer: >60 mL/min (ref >60)
GFR calc non Af Amer: 57 mL/min — ABNORMAL LOW (ref >60)
Glucose, Bld: 124 mg/dL — ABNORMAL HIGH (ref 70–99)
Potassium: 4.2 mmol/L (ref 3.5–5.1)
Sodium: 143 mmol/L (ref 135–145)
Total Bilirubin: 0.5 mg/dL (ref 0.3–1.2)
Total Protein: 7.2 g/dL (ref 6.5–8.1)

## 2019-01-27 LAB — CBC WITH DIFFERENTIAL/PLATELET
Abs Immature Granulocytes: 0.05 10*3/uL (ref 0.00–0.07)
Basophils Absolute: 0 10*3/uL (ref 0.0–0.1)
Basophils Relative: 1 %
Eosinophils Absolute: 0.3 10*3/uL (ref 0.0–0.5)
Eosinophils Relative: 4 %
HCT: 34.5 % — ABNORMAL LOW (ref 36.0–46.0)
Hemoglobin: 10.9 g/dL — ABNORMAL LOW (ref 12.0–15.0)
Immature Granulocytes: 1 %
Lymphocytes Relative: 32 %
Lymphs Abs: 2 10*3/uL (ref 0.7–4.0)
MCH: 27.7 pg (ref 26.0–34.0)
MCHC: 31.6 g/dL (ref 30.0–36.0)
MCV: 87.8 fL (ref 80.0–100.0)
Monocytes Absolute: 0.5 10*3/uL (ref 0.1–1.0)
Monocytes Relative: 8 %
Neutro Abs: 3.4 10*3/uL (ref 1.7–7.7)
Neutrophils Relative %: 54 %
Platelets: 182 10*3/uL (ref 150–400)
RBC: 3.93 MIL/uL (ref 3.87–5.11)
RDW: 13.2 % (ref 11.5–15.5)
WBC: 6.3 10*3/uL (ref 4.0–10.5)
nRBC: 0 % (ref 0.0–0.2)

## 2019-01-27 LAB — GLUCOSE, CAPILLARY
Glucose-Capillary: 120 mg/dL — ABNORMAL HIGH (ref 70–99)
Glucose-Capillary: 129 mg/dL — ABNORMAL HIGH (ref 70–99)
Glucose-Capillary: 171 mg/dL — ABNORMAL HIGH (ref 70–99)
Glucose-Capillary: 171 mg/dL — ABNORMAL HIGH (ref 70–99)
Glucose-Capillary: 178 mg/dL — ABNORMAL HIGH (ref 70–99)
Glucose-Capillary: 182 mg/dL — ABNORMAL HIGH (ref 70–99)
Glucose-Capillary: 211 mg/dL — ABNORMAL HIGH (ref 70–99)

## 2019-01-27 LAB — AMYLASE: Amylase: 103 U/L — ABNORMAL HIGH (ref 28–100)

## 2019-01-27 LAB — LIPASE, BLOOD: Lipase: 146 U/L — ABNORMAL HIGH (ref 11–51)

## 2019-01-27 LAB — HEMOGLOBIN: Hemoglobin: 11 g/dL — ABNORMAL LOW (ref 12.0–15.0)

## 2019-01-27 MED ORDER — PANTOPRAZOLE SODIUM 40 MG PO TBEC
40.0000 mg | DELAYED_RELEASE_TABLET | Freq: Two times a day (BID) | ORAL | Status: DC
  Administered 2019-01-27 – 2019-01-29 (×5): 40 mg via ORAL
  Filled 2019-01-27 (×4): qty 1

## 2019-01-27 MED ORDER — ONDANSETRON HCL 4 MG/2ML IJ SOLN
4.0000 mg | Freq: Four times a day (QID) | INTRAMUSCULAR | Status: DC | PRN
  Administered 2019-01-27 – 2019-01-28 (×3): 4 mg via INTRAVENOUS
  Filled 2019-01-27 (×3): qty 2

## 2019-01-27 NOTE — Consult Note (Signed)
ORTHOPAEDIC CONSULT The patient states that her left shoulder symptoms are essentially unchanged. She participated with PT and OT yesterday and this morning.  Left shoulder is still tender to palpation along the proximal biceps and the deltoid insertion. Rotation is slightly improved, although the patient demonstrates guarding. Good strength with internal and external rotation against resistance.  Impression:  Adhesive capsulitis of the left shoulder  Plan: PT and OT notes reviewed. I believe the patient would benefit from continuation of physical therapy and occupational therapy after discharge. I have ordered home PT until such time as she could safely transition to outpatient PT.  Follow-up in the office in 2-3 weeks.  Othniel Maret P. Holley Bouche M.D.

## 2019-01-27 NOTE — Consult Note (Signed)
[facility] GI Inpatient Consult Note   Kathline Magic, M.D.  Reason for Consult: Nausea, vomiting, reflux, dysphagia. Persistent abdominal pain.   Attending Requesting Consult: Cordelia Poche, MD   History of Present Illness: Karen [name] is a 64 y.o. female with a history of alcohol abuse and pancreatitis, chronic, presenting with nausea and vomiting and abdominal pain along with left shoulder pain and dysuria. Patient treated empirically for acute on chronic pancreatitis with minimal benefit. Patient claims she quit drinking alcohol about 3 years ago to decrease the number of attacks of pancreatitis. Radiographically, the pancreas looked normal and although there was a mild elevation of white count at 14,000 at hospital admission, this has not been rechecked. Patient takes Creon at home administering 2 pills with meals. This seems to help her chronic pain. For about the last month, however, the patient has had progressive regurgitation, heartburn and nausea. She is taking no significant acid suppression for the symptoms, however. She has had dysphagia to solids intermittently which have made the eating difficult. She has had no weight loss. She denies use of NSAIDs on a regular basis and takes mainly Tylenol. The patient denies a family history of colon or stomach cancer and is nave to both upper and lower luminal evaluations in the form of EGD, colonoscopy, flexible sigmoidoscopy, barium enema or upper GI series.  Patient has a long history of insulin requiring diabetes mellitus type 2. She reports her last outpatient hemoglobin A1c was "around 7".   In regards to lower GI symptoms, the patient has some moderate constipation and small amount of bright red blood per rectum while straining in an effort to have a bowel movement yesterday. There is no recurrent bleeding today. Patient denies any personal known history of liver disease or ascites. Past Medical History:  Past Medical  History:  Diagnosis Date  . Alcoholic pancreatitis   . Asthma   . Depression   . Diabetes mellitus without complication ([place])   . DKA (diabetic ketoacidoses) ([place]) 02/17/2015  . Emphysematous cystitis   . Hypercholesteremia   . Hypertension   . Hypokalemia     Problem List: Patient Active Problem List   Diagnosis Date Noted  . Constipation   . Acute on chronic pancreatitis ([place]) 01/25/2019  . Acute pain of left shoulder   . Adhesive capsulitis of left shoulder   . Abnormal LFTs 01/24/2019  . Cough 01/24/2019  . Hypertension   . Hypercholesteremia   . Type 2 diabetes mellitus with hyperlipidemia ([place])   . GERD (gastroesophageal reflux disease)   . Acute renal failure superimposed on stage 3a chronic kidney disease ([place])   . Pancreatitis, recurrent   . Chronic pancreatitis ([place]) 09/25/2018  . 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 Coffee ground emesis   . Upper GI bleed 11/11/2017  . Pancreatitis, acute 01/03/2017  . ETOH abuse 01/03/2017  . UTI (urinary tract infection) 01/03/2017  . Nausea and vomiting   . Esophageal candidiasis ([place])   . Intractable vomiting with nausea   . Hypertensive urgency 04/21/2016  . Abdominal pain   . Acute renal failure ([place])   . Nausea vomiting and diarrhea   . Smoker   . Poorly controlled type 2 diabetes mellitus ([place])   . Acute renal insufficiency 10/29/2015  . Elevated troponin 10/29/2015  . Urinary tract infection without hematuria 10/29/2015  . Left flank pain 10/29/2015  . Malignant essential hypertension 10/29/2015  . DKA (diabetic ketoacidoses) ([place]) 02/17/2015  . Emphysematous cystitis  Past Surgical History: Past Surgical History:  Procedure Laterality Date  . ABDOMINAL HYSTERECTOMY  1996  . APPENDECTOMY  1997  . ESOPHAGOGASTRODUODENOSCOPY N/A 11/12/2017   Procedure: ESOPHAGOGASTRODUODENOSCOPY (EGD);  Surgeon: Lin Landsman, MD;  Location: [facility] ENDOSCOPY;  Service: Gastroenterology;  Laterality: N/A;  .  ESOPHAGOGASTRODUODENOSCOPY N/A 09/29/2018   Procedure: ESOPHAGOGASTRODUODENOSCOPY (EGD);  Surgeon: Daison Braxton, Benay Pike, MD;  Location: [facility] ENDOSCOPY;  Service: Gastroenterology;  Laterality: N/A;  . ESOPHAGOGASTRODUODENOSCOPY (EGD) WITH PROPOFOL N/A 04/24/2016   Procedure: ESOPHAGOGASTRODUODENOSCOPY (EGD) WITH PROPOFOL;  Surgeon: [name], MD;  Location: [facility] ENDOSCOPY;  Service: Endoscopy;  Laterality: N/A;  . HAND SURGERY  1988  . THYROID SURGERY  2013    Allergies: Allergies  Allergen Reactions  . Penicillins Anaphylaxis    Has patient had a PCN reaction causing immediate rash, facial/tongue/throat swelling, SOB or lightheadedness with hypotension: Yes Has patient had a PCN reaction causing severe rash involving mucus membranes or skin necrosis: No Has patient had a PCN reaction that required hospitalization No Has patient had a PCN reaction occurring within the last 10 years: No If all of the above answers are "NO", then may proceed with Cephalosporin use.  . Reglan [Metoclopramide] Other (See Comments)    Hypotension, shortness of breath  . Percocet [Oxycodone-Acetaminophen] Rash    Home Medications: Medications Prior to Admission  Medication Sig Dispense Refill Last Dose  . amLODipine (NORVASC) 10 MG tablet Take 10 mg by mouth daily.   01/23/2019 at Unknown time  . cyclobenzaprine (FLEXERIL) 5 MG tablet Take 1 tablet (5 mg total) by mouth 2 (two) times daily. (Patient taking differently: Take 5 mg by mouth 2 (two) times daily as needed for muscle spasms. ) 30 tablet 0 Past Week at PRN  . dapagliflozin propanediol (FARXIGA) 10 MG TABS tablet Take 10 mg by mouth daily.   01/23/2019 at Unknown time  . HYDROcodone-acetaminophen (NORCO) 10-325 MG tablet Take 1 tablet by mouth 4 (four) times daily.    01/23/2019 at Unknown time  . insulin aspart (NOVOLOG) 100 UNIT/ML injection Inject 15 Units into the skin 2 (two) times daily.   01/23/2019 at Unknown time  . omeprazole (PRILOSEC) 40 MG  capsule Take 1 capsule (40 mg total) by mouth 2 (two) times daily for 14 days. 28 capsule 0 01/23/2019 at Unknown time  . pregabalin (LYRICA) 150 MG capsule Take 150 mg by mouth 2 (two) times daily.   01/23/2019 at Unknown time  . rosuvastatin (CRESTOR) 20 MG tablet Take 20 mg by mouth daily.   01/23/2019 at Unknown time  . lipase/protease/amylase (CREON) 36000 UNITS CPEP capsule Take 1 capsule (36,000 Units total) by mouth 2 (two) times daily with a meal. Take this with snacks up to twice a day. (Patient not taking: Reported on 01/24/2019) 180 capsule 0 Not Taking at Unknown time   Home medication reconciliation was completed with the patient.   Scheduled Inpatient Medications:   . amLODipine  10 mg Oral Daily  . dextromethorphan-guaiFENesin  1 tablet Oral BID  . insulin aspart  0-5 Units Subcutaneous QHS  . insulin aspart  0-9 Units Subcutaneous TID WC  . insulin detemir  6 Units Subcutaneous QHS  . lipase/protease/amylase  36,000 Units Oral BID WC  . nystatin   Topical BID  . pantoprazole  40 mg Oral BID  . pregabalin  150 mg Oral BID  . rosuvastatin  20 mg Oral Daily    Continuous Inpatient Infusions:   . sodium chloride 75 mL/hr at 01/27/19 0840  .  cefTRIAXone (ROCEPHIN)  IV 2 g (01/27/19 1220)    PRN Inpatient Medications:  albuterol, cyclobenzaprine, hydrALAZINE, HYDROcodone-acetaminophen, morphine injection, ondansetron (ZOFRAN) IV, traZODone  Family History: Family history is unknown by patient.   GI Family History: Negative.  Social History:   reports that she has quit smoking. She has never used smokeless tobacco. She reports previous alcohol use. She reports that she does not use drugs. The patient denies ETOH, tobacco, or drug use.    Review of Systems: Review of Systems - History obtained from the patient General ROS: positive for  - fatigue negative for - weight loss Psychological ROS: positive for - anxiety negative for - hallucinations, memory difficulties,  obsessive thoughts or suicidal ideation ENT ROS: negative Allergy and Immunology ROS: negative Hematological and Lymphatic ROS: negative Endocrine ROS: positive for - hair pattern changes, palpitations and polydipsia/polyuria negative for - temperature intolerance Respiratory ROS: no cough, shortness of breath, or wheezing Cardiovascular ROS: no chest pain or dyspnea on exertion Gastrointestinal ROS: As in the history of present illness. Genito-Urinary ROS: positive for - dysuria Musculoskeletal ROS: positive for - pain in shoulder - left Neurological ROS: no TIA or stroke symptoms Dermatological ROS: negative  Physical Examination: BP (!) 143/73 (BP Location: Right Arm)   Pulse 89   Temp 98.2 F (36.8 C) (Oral)   Resp 18   Ht 5\' 1"  (1.549 m)   Wt 52.2 kg   SpO2 99%   BMI 21.73 kg/m  Physical Exam Vitals and nursing note reviewed.  Constitutional:      Appearance: Normal appearance.  HENT:     Head: Normocephalic and atraumatic.     Nose: Nose normal.  Eyes:     Conjunctiva/sclera: Conjunctivae normal.     Pupils: Pupils are equal, round, and reactive to light.  Cardiovascular:     Rate and Rhythm: Normal rate.     Pulses: Normal pulses.     Heart sounds: Normal heart sounds.  Pulmonary:     Effort: Pulmonary effort is normal.     Breath sounds: Normal breath sounds.  Abdominal:     General: There is distension.     Tenderness: There is abdominal tenderness.  Musculoskeletal:        General: Normal range of motion.  Skin:    General: Skin is warm and dry.  Neurological:     General: No focal deficit present.     Mental Status: She is alert.  Psychiatric:        Mood and Affect: Mood normal.        Behavior: Behavior normal.     Data: Lab Results  Component Value Date   WBC 6.3 01/27/2019   HGB 11.0 (L) 01/27/2019   HGB 10.9 (L) 01/27/2019   HCT 34.5 (L) 01/27/2019   MCV 87.8 01/27/2019   PLT 182 01/27/2019   Recent Labs  Lab 01/24/19 0950  01/27/19 0500  HGB 12.3 10.9*  11.0*   Lab Results  Component Value Date   NA 143 01/27/2019   K 4.2 01/27/2019   CL 111 01/27/2019   CO2 24 01/27/2019   BUN 10 01/27/2019   CREATININE 1.06 (H) 01/27/2019   Lab Results  Component Value Date   ALT 51 (H) 01/27/2019   AST 34 01/27/2019   ALKPHOS 225 (H) 01/27/2019   BILITOT 0.5 01/27/2019   No results for input(s): APTT, INR, PTT in the last 168 hours. CBC Latest Ref Rng & Units 01/27/2019 01/27/2019 01/24/2019  WBC 4.0 -  10.5 K/uL 6.3 - 14.1(H)  Hemoglobin 12.0 - 15.0 g/dL 10.9(L) 11.0(L) 12.3  Hematocrit 36.0 - 46.0 % 34.5(L) - 37.2  Platelets 150 - 400 K/uL 182 - 196    STUDIES: MR SHOULDER LEFT WO CONTRAST  Result Date: 01/25/2019 CLINICAL DATA:  Left shoulder pain and limited range of motion for several months. No known injury. EXAM: MRI OF THE LEFT SHOULDER WITHOUT CONTRAST TECHNIQUE: Multiplanar, multisequence MR imaging of the shoulder was performed. No intravenous contrast was administered. COMPARISON:  None. FINDINGS: Rotator cuff: Intact. Heterogeneously increased T2 signal in the rotator cuff tendons is most notable in the supraspinatus. Muscles:  Normal. No atrophy or focal lesion Biceps long head:  Intact. Acromioclavicular Joint: Minimal osteoarthritis. Type 2 acromion. Small volume of subacromial/subdeltoid fluid noted Glenohumeral Joint: There is thickening and intermediate increased T2 signal in the inferior glenohumeral ligament compatible with adhesive capsulitis. Otherwise negative Labrum:  Intact. Bones:  Negative. Other: None. IMPRESSION: 1. Findings compatible with adhesive capsulitis. 2. Rotator cuff tendinopathy without tear. 3. Minimal acromioclavicular osteoarthritis. 4. Small volume of subacromial/subdeltoid fluid compatible with bursitis. Electronically Signed   By: Inge Rise M.D.   On: 01/25/2019 14:21   US Abdomen Limited RUQ  Result Date: 01/27/2019 CLINICAL DATA:  Increasing abdominal pain EXAM:  ULTRASOUND ABDOMEN LIMITED RIGHT UPPER QUADRANT COMPARISON:  01/24/2010 FINDINGS: Gallbladder: Gallbladder is well distended. Mild gallbladder sludge is seen. The wall is mildly thickened at 3.9 mm with a negative sonographic Murphy's sign. This may be related to incomplete distension. Common bile duct: Diameter: 4.2 mm. Liver: Previously seen hyperechoic lesion is not well appreciated on today's exam due to patient positioning. No acute abnormality in the liver is seen. Portal vein is patent on color Doppler imaging with normal direction of blood flow towards the liver. Other: None. IMPRESSION: Mild gallbladder sludge. Mild gallbladder wall thickening is seen although this is felt to be related to incomplete distension of the gallbladder. No pericholecystic fluid is seen and a negative sonographic Murphy's sign is noted. No acute abnormality is seen. Electronically Signed   By: Inez Catalina M.D.   On: 01/27/2019 09:48   @IMAGES @  Assessment:  1. Acute on chronic abdominal pain-symptoms appear to be suggestive of gastritis/severe GERD with possible dysphagia relating to esophagitis versus esophageal stricture.  2. Dysphagia-likely related to uncontrolled GERD with or without mechanical blockage in the form of a stricture or web or ring. Cannot rule out malignancy at this time without proper luminal evaluation.  3. Right upper quadrant epigastric abdominal pain.  4. Constipation with hematochezia-this will require some endoluminal evaluation at a later time, it is doubtful that we will get done during this hospitalization due to persistent constipation, nausea and vomiting and likely inability to tolerate an oral got lavage preparation. Patient is colonoscopy nave and overdue for colon cancer screening.  5. Mild anemia-likely chronic secondary to chronic medical issues, however, cannot rule out influence from bleeding or GI blood loss.  COVID-19 status: Tested negative  Recommendations:  1. Acid  suppression. 2. Continue IV fluid support and antiemetics as needed. 3. N.p.o. after midnight. 4. EGD will be planned for tomorrow.The patient understands the nature of the planned procedure, indications, risks, alternatives and potential complications including but not limited to bleeding, infection, perforation, damage to internal organs and possible oversedation/side effects from anesthesia. The patient agrees and gives consent to proceed.  Please refer to procedure notes for findings, recommendations and patient disposition/instructions.   Thank you for the consult. Please call  with questions or concerns.  Olean Ree, "Lanny Hurst MD [facility] [place], [place] [address] [phone]  01/27/2019 12:55 PM

## 2019-01-27 NOTE — Progress Notes (Signed)
Patient ID: Karen [name], female   DOB: May 16, 1955, 64 y.o.   MRN: [id number] [facility] PROGRESS NOTE  Karen [name] [id number] DOB: 30-Sep-1955 DOA: 01/24/2019 PCP: Novella Rob, FNP  HPI/Subjective: Patient still with abdominal pain.  Finally had multiple bowel movements yesterday after lactulose.  Feels her abdomen is bloated.  Had vomiting last night after liquid diet.  Just not feeling well.  Shoulder still hurts.  Her right hand hurts also.  Objective: Vitals:   01/27/19 0839 01/27/19 1206  BP: (!) 157/80 (!) 143/73  Pulse: 87 89  Resp:  18  Temp:  98.2 F (36.8 C)  SpO2: 100% 99%    Intake/Output Summary (Last 24 hours) at 01/27/2019 1330 Last data filed at 01/26/2019 1900 Gross per 24 hour  Intake 1557.74 ml  Output --  Net 1557.74 ml   Filed Weights   01/24/19 0935  Weight: 52.2 kg    ROS: Review of Systems  Constitutional: Negative for chills and fever.  Eyes: Negative for blurred vision.  Respiratory: Negative for cough and shortness of breath.   Cardiovascular: Negative for chest pain.  Gastrointestinal: Positive for abdominal pain and constipation. Negative for diarrhea, nausea and vomiting.  Genitourinary: Negative for dysuria.  Musculoskeletal: Positive for joint pain.  Neurological: Negative for dizziness and headaches.   Exam: Physical Exam  Constitutional: She is oriented to person, place, and time.  HENT:  Nose: No mucosal edema.  Mouth/Throat: No oropharyngeal exudate or posterior oropharyngeal edema.  Eyes: Pupils are equal, round, and reactive to light. Conjunctivae, EOM and lids are normal.  Neck: Carotid bruit is not present.  Cardiovascular: S1 normal and S2 normal. Exam reveals no gallop.  No murmur heard. Pulses:      Dorsalis pedis pulses are 2+ on the right side and 2+ on the left side.  Respiratory: No respiratory distress. She has no wheezes. She has no rhonchi. She has no rales.  GI: Soft. Bowel sounds are normal. There is  abdominal tenderness.  Musculoskeletal:     Left shoulder: Decreased range of motion.     Right ankle: No swelling.     Left ankle: No swelling.  Lymphadenopathy:    She has no cervical adenopathy.  Neurological: She is alert and oriented to person, place, and time. No cranial nerve deficit.  Skin: Skin is warm. No rash noted. Nails show no clubbing.  Psychiatric: She has a normal mood and affect.      Data Reviewed: Basic Metabolic Panel: Recent Labs  Lab 01/24/19 0950 01/25/19 0547 01/27/19 0500  NA 132* 142 143  K 4.4 4.1 4.2  CL 95* 109 111  CO2 24 26 24   GLUCOSE 420* 135* 124*  BUN 31* 20 10  CREATININE 1.54* 1.06* 1.06*  CALCIUM 8.9 8.7* 8.5*   Liver Function Tests: Recent Labs  Lab 01/24/19 0950 01/25/19 0547 01/27/19 0500  AST 73* 31 34  ALT 100* 70* 51*  ALKPHOS 302* 232* 225*  BILITOT 0.8 0.9 0.5  PROT 8.2* 7.1 7.2  ALBUMIN 3.8 3.5 3.3*   Recent Labs  Lab 01/24/19 0950 01/25/19 0547 01/27/19 0500  LIPASE 163* 60* 146*  AMYLASE  --   --  103*   CBC: Recent Labs  Lab 01/24/19 0950 01/27/19 0500  WBC 14.1* 6.3  NEUTROABS  --  3.4  HGB 12.3 10.9*  11.0*  HCT 37.2 34.5*  MCV 84.5 87.8  PLT 196 182   BNP (last 3 results) Recent Labs  03/12/18 0844  BNP 28.0     CBG: Recent Labs  Lab 01/26/19 2027 01/27/19 0015 01/27/19 0436 01/27/19 0741 01/27/19 1205  GLUCAP 324* 171* 120* 129* 211*    Recent Results (from the past 240 hour(s))  Respiratory Panel by RT PCR (Flu A&B, Covid) - Nasopharyngeal Swab     Status: None   Collection Time: 01/24/19 10:33 AM   Specimen: Nasopharyngeal Swab  Result Value Ref Range Status   SARS Coronavirus 2 by RT PCR NEGATIVE NEGATIVE Final    Comment: (NOTE) SARS-CoV-2 target nucleic acids are NOT DETECTED. The SARS-CoV-2 RNA is generally detectable in upper respiratoy specimens during the acute phase of infection. The lowest concentration of SARS-CoV-2 viral copies this assay can detect  is 131 copies/mL. A negative result does not preclude SARS-Cov-2 infection and should not be used as the sole basis for treatment or other patient management decisions. A negative result may occur with  improper specimen collection/handling, submission of specimen other than nasopharyngeal swab, presence of viral mutation(s) within the areas targeted by this assay, and inadequate number of viral copies (<131 copies/mL). A negative result must be combined with clinical observations, patient history, and epidemiological information. The expected result is Negative. Fact Sheet for Patients:  PinkCheek.be Fact Sheet for Healthcare Providers:  GravelBags.it This test is not yet ap proved or cleared by the [place] FDA and  has been authorized for detection and/or diagnosis of SARS-CoV-2 by FDA under an Emergency Use Authorization (EUA). This EUA will remain  in effect (meaning this test can be used) for the duration of the COVID-19 declaration under Section 564(b)(1) of the Act, 21 U.S.C. section 360bbb-3(b)(1), unless the authorization is terminated or revoked sooner.    Influenza A by PCR NEGATIVE NEGATIVE Final   Influenza B by PCR NEGATIVE NEGATIVE Final    Comment: (NOTE) The Xpert Xpress SARS-CoV-2/FLU/RSV assay is intended as an aid in  the diagnosis of influenza from Nasopharyngeal swab specimens and  should not be used as a sole basis for treatment. Nasal washings and  aspirates are unacceptable for Xpert Xpress SARS-CoV-2/FLU/RSV  testing. Fact Sheet for Patients: PinkCheek.be Fact Sheet for Healthcare Providers: GravelBags.it This test is not yet approved or cleared by the [place] FDA and  has been authorized for detection and/or diagnosis of SARS-CoV-2 by  FDA under an Emergency Use Authorization (EUA). This EUA will remain  in effect (meaning this  test can be used) for the duration of the  Covid-19 declaration under Section 564(b)(1) of the Act, 21  U.S.C. section 360bbb-3(b)(1), unless the authorization is  terminated or revoked. Performed at [facility], [address], [place], [place] [address]   Urine culture     Status: Abnormal   Collection Time: 01/24/19 10:33 AM   Specimen: Urine, Random  Result Value Ref Range Status   Specimen Description   Final    URINE, RANDOM Performed at [facility], [address] [place], [place] [address]    Special Requests   Final    NONE Performed at [facility], [address] [place], [place] [address]    Culture (A)  Final    80,000 COLONIES/mL GROUP B STREP(S.AGALACTIAE)ISOLATED TESTING AGAINST S. AGALACTIAE NOT ROUTINELY PERFORMED DUE TO PREDICTABILITY OF AMP/PEN/VAN SUSCEPTIBILITY. Performed at [facility], [place] [address] [place], [place] [address]    Report Status 01/25/2019 FINAL  Final  CULTURE, BLOOD (ROUTINE X 2) w Reflex to ID Panel  Status: None (Preliminary result)   Collection Time: 01/24/19  5:01 PM   Specimen: BLOOD  Result Value Ref Range Status   Specimen Description BLOOD LEFT ANTECUBITAL  Final   Special Requests   Final    BOTTLES DRAWN AEROBIC AND ANAEROBIC Blood Culture adequate volume   Culture   Final    NO GROWTH 3 DAYS Performed at [facility], [address] [place], [place] [address]    Report Status PENDING  Incomplete  CULTURE, BLOOD (ROUTINE X 2) w Reflex to ID Panel     Status: None (Preliminary result)   Collection Time: 01/24/19  5:01 PM   Specimen: BLOOD  Result Value Ref Range Status   Specimen Description BLOOD BLOOD RIGHT WRIST  Final   Special Requests   Final    BOTTLES DRAWN AEROBIC AND ANAEROBIC Blood Culture adequate volume   Culture   Final    NO GROWTH 3 DAYS Performed at [facility], [address] [place], [place] [address]    Report Status PENDING   Incomplete     Studies: MR SHOULDER LEFT WO CONTRAST  Result Date: 01/25/2019 CLINICAL DATA:  Left shoulder pain and limited range of motion for several months. No known injury. EXAM: MRI OF THE LEFT SHOULDER WITHOUT CONTRAST TECHNIQUE: Multiplanar, multisequence MR imaging of the shoulder was performed. No intravenous contrast was administered. COMPARISON:  None. FINDINGS: Rotator cuff: Intact. Heterogeneously increased T2 signal in the rotator cuff tendons is most notable in the supraspinatus. Muscles:  Normal. No atrophy or focal lesion Biceps long head:  Intact. Acromioclavicular Joint: Minimal osteoarthritis. Type 2 acromion. Small volume of subacromial/subdeltoid fluid noted Glenohumeral Joint: There is thickening and intermediate increased T2 signal in the inferior glenohumeral ligament compatible with adhesive capsulitis. Otherwise negative Labrum:  Intact. Bones:  Negative. Other: None. IMPRESSION: 1. Findings compatible with adhesive capsulitis. 2. Rotator cuff tendinopathy without tear. 3. Minimal acromioclavicular osteoarthritis. 4. Small volume of subacromial/subdeltoid fluid compatible with bursitis. Electronically Signed   By: Inge Rise M.D.   On: 01/25/2019 14:21   US Abdomen Limited RUQ  Result Date: 01/27/2019 CLINICAL DATA:  Increasing abdominal pain EXAM: ULTRASOUND ABDOMEN LIMITED RIGHT UPPER QUADRANT COMPARISON:  01/24/2010 FINDINGS: Gallbladder: Gallbladder is well distended. Mild gallbladder sludge is seen. The wall is mildly thickened at 3.9 mm with a negative sonographic Murphy's sign. This may be related to incomplete distension. Common bile duct: Diameter: 4.2 mm. Liver: Previously seen hyperechoic lesion is not well appreciated on today's exam due to patient positioning. No acute abnormality in the liver is seen. Portal vein is patent on color Doppler imaging with normal direction of blood flow towards the liver. Other: None. IMPRESSION: Mild gallbladder sludge. Mild  gallbladder wall thickening is seen although this is felt to be related to incomplete distension of the gallbladder. No pericholecystic fluid is seen and a negative sonographic Murphy's sign is noted. No acute abnormality is seen. Electronically Signed   By: Inez Catalina M.D.   On: 01/27/2019 09:48    Scheduled Meds: . amLODipine  10 mg Oral Daily  . dextromethorphan-guaiFENesin  1 tablet Oral BID  . insulin aspart  0-5 Units Subcutaneous QHS  . insulin aspart  0-9 Units Subcutaneous TID WC  . insulin detemir  6 Units Subcutaneous QHS  . lipase/protease/amylase  36,000 Units Oral BID WC  . nystatin   Topical BID  . pantoprazole  40 mg Oral BID  . pregabalin  150 mg Oral BID  . rosuvastatin  20 mg Oral Daily   Continuous Infusions: . sodium chloride 75 mL/hr at 01/27/19 0840  . cefTRIAXone (ROCEPHIN)  IV 2 g (01/27/19 1220)    Assessment/Plan:  1. Acute on chronic pancreatitis.  IV fluid hydration and pain control.  With patient still having discomfort and lipase trending up today I did get a right upper quadrant ultrasound that did show sludge in the gallbladder.  Gastroenterological consultation was done and they will do an EGD tomorrow.  This could also be a diabetic gastroparesis that patient has allergy to Reglan so give supportive care at this point. 2. Acute cystitis without hematuria.  Patient on Rocephin.  Group B strep growing out a culture. 3. Left shoulder pain.  MRI showing adhesive capsulitis.  Appreciate orthopedic consultation.  Physical therapy as outpatient set up by Dr. Marry Guan 4. Type 2 diabetes mellitus with hyperlipidemia.  Low-dose Levemir. 5. Essential hypertension on amlodipine 6. Constipation.  Resolved.  Patient also states that she had blood in the bowel movement.  At some point patient will need a colonoscopy screening test.  Code Status:     Code Status Orders  (From admission, onward)         Start     Ordered   01/24/19 1613  Full code  Continuous      01/24/19 1612        Code Status History    Date Active Date Inactive Code Status Order ID Comments User Context   09/25/2018 1442 09/29/2018 2252 Full Code [id number]  Demetrios Loll, MD ED   11/11/2017 1417 11/13/2017 1948 Full Code [id number]  Hillary Bow, MD ED   04/25/2017 1039 04/26/2017 1415 Full Code [id number]  Schuyler Amor, MD ED   01/03/2017 1730 01/07/2017 1531 Full Code [id number]  Idelle Crouch, MD Inpatient   12/10/2016 1201 12/12/2016 1712 Full Code [id number]  Demetrios Loll, MD Inpatient   10/15/2016 2350 10/17/2016 0056 Full Code [id number]  Dustin Flock, MD Inpatient   04/21/2016 0505 04/24/2016 2031 Full Code [id number]  Saundra Shelling, MD Inpatient   12/31/2015 0739 01/01/2016 2008 Full Code [id number]  Saundra Shelling, MD Inpatient   12/10/2015 1109 12/12/2015 1848 Full Code [id number]  Demetrios Loll, MD Inpatient   10/29/2015 1545 11/01/2015 1623 Full Code [id number]  Theodoro Grist, MD Inpatient   02/17/2015 1704 02/19/2015 1804 Full Code [id number]  Nicholes Mango, MD Inpatient   Advance Care Planning Activity     Disposition Plan: Patient still having abdominal pain need to monitor on a day-to-day basis.  Consultants:  Orthopedic surgery  Time spent: 28 minutes.  Case discussed with gastroenterology  Karen [name]  [facility]

## 2019-01-27 NOTE — H&P (View-Only) (Signed)
[facility] GI Inpatient Consult Note   Kathline Magic, M.D.  Reason for Consult: Nausea, vomiting, reflux, dysphagia. Persistent abdominal pain.   Attending Requesting Consult: Cordelia Poche, MD   History of Present Illness: Karen [name] is a 64 y.o. female with a history of alcohol abuse and pancreatitis, chronic, presenting with nausea and vomiting and abdominal pain along with left shoulder pain and dysuria. Patient treated empirically for acute on chronic pancreatitis with minimal benefit. Patient claims she quit drinking alcohol about 3 years ago to decrease the number of attacks of pancreatitis. Radiographically, the pancreas looked normal and although there was a mild elevation of white count at 14,000 at hospital admission, this has not been rechecked. Patient takes Creon at home administering 2 pills with meals. This seems to help her chronic pain. For about the last month, however, the patient has had progressive regurgitation, heartburn and nausea. She is taking no significant acid suppression for the symptoms, however. She has had dysphagia to solids intermittently which have made the eating difficult. She has had no weight loss. She denies use of NSAIDs on a regular basis and takes mainly Tylenol. The patient denies a family history of colon or stomach cancer and is nave to both upper and lower luminal evaluations in the form of EGD, colonoscopy, flexible sigmoidoscopy, barium enema or upper GI series.  Patient has a long history of insulin requiring diabetes mellitus type 2. She reports her last outpatient hemoglobin A1c was "around 7".   In regards to lower GI symptoms, the patient has some moderate constipation and small amount of bright red blood per rectum while straining in an effort to have a bowel movement yesterday. There is no recurrent bleeding today. Patient denies any personal known history of liver disease or ascites. Past Medical History:  Past Medical  History:  Diagnosis Date  . Alcoholic pancreatitis   . Asthma   . Depression   . Diabetes mellitus without complication ([place])   . DKA (diabetic ketoacidoses) ([place]) 02/17/2015  . Emphysematous cystitis   . Hypercholesteremia   . Hypertension   . Hypokalemia     Problem List: Patient Active Problem List   Diagnosis Date Noted  . Constipation   . Acute on chronic pancreatitis ([place]) 01/25/2019  . Acute pain of left shoulder   . Adhesive capsulitis of left shoulder   . Abnormal LFTs 01/24/2019  . Cough 01/24/2019  . Hypertension   . Hypercholesteremia   . Type 2 diabetes mellitus with hyperlipidemia ([place])   . GERD (gastroesophageal reflux disease)   . Acute renal failure superimposed on stage 3a chronic kidney disease ([place])   . Pancreatitis, recurrent   . Chronic pancreatitis ([place]) 09/25/2018  . 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 Coffee ground emesis   . Upper GI bleed 11/11/2017  . Pancreatitis, acute 01/03/2017  . ETOH abuse 01/03/2017  . UTI (urinary tract infection) 01/03/2017  . Nausea and vomiting   . Esophageal candidiasis ([place])   . Intractable vomiting with nausea   . Hypertensive urgency 04/21/2016  . Abdominal pain   . Acute renal failure ([place])   . Nausea vomiting and diarrhea   . Smoker   . Poorly controlled type 2 diabetes mellitus ([place])   . Acute renal insufficiency 10/29/2015  . Elevated troponin 10/29/2015  . Urinary tract infection without hematuria 10/29/2015  . Left flank pain 10/29/2015  . Malignant essential hypertension 10/29/2015  . DKA (diabetic ketoacidoses) ([place]) 02/17/2015  . Emphysematous cystitis  Past Surgical History: Past Surgical History:  Procedure Laterality Date  . ABDOMINAL HYSTERECTOMY  1996  . APPENDECTOMY  1997  . ESOPHAGOGASTRODUODENOSCOPY N/A 11/12/2017   Procedure: ESOPHAGOGASTRODUODENOSCOPY (EGD);  Surgeon: Lin Landsman, MD;  Location: [facility] ENDOSCOPY;  Service: Gastroenterology;  Laterality: N/A;  .  ESOPHAGOGASTRODUODENOSCOPY N/A 09/29/2018   Procedure: ESOPHAGOGASTRODUODENOSCOPY (EGD);  Surgeon: Encarnacion Bole, Benay Pike, MD;  Location: [facility] ENDOSCOPY;  Service: Gastroenterology;  Laterality: N/A;  . ESOPHAGOGASTRODUODENOSCOPY (EGD) WITH PROPOFOL N/A 04/24/2016   Procedure: ESOPHAGOGASTRODUODENOSCOPY (EGD) WITH PROPOFOL;  Surgeon: [name], MD;  Location: [facility] ENDOSCOPY;  Service: Endoscopy;  Laterality: N/A;  . HAND SURGERY  1988  . THYROID SURGERY  2013    Allergies: Allergies  Allergen Reactions  . Penicillins Anaphylaxis    Has patient had a PCN reaction causing immediate rash, facial/tongue/throat swelling, SOB or lightheadedness with hypotension: Yes Has patient had a PCN reaction causing severe rash involving mucus membranes or skin necrosis: No Has patient had a PCN reaction that required hospitalization No Has patient had a PCN reaction occurring within the last 10 years: No If all of the above answers are "NO", then may proceed with Cephalosporin use.  . Reglan [Metoclopramide] Other (See Comments)    Hypotension, shortness of breath  . Percocet [Oxycodone-Acetaminophen] Rash    Home Medications: Medications Prior to Admission  Medication Sig Dispense Refill Last Dose  . amLODipine (NORVASC) 10 MG tablet Take 10 mg by mouth daily.   01/23/2019 at Unknown time  . cyclobenzaprine (FLEXERIL) 5 MG tablet Take 1 tablet (5 mg total) by mouth 2 (two) times daily. (Patient taking differently: Take 5 mg by mouth 2 (two) times daily as needed for muscle spasms. ) 30 tablet 0 Past Week at PRN  . dapagliflozin propanediol (FARXIGA) 10 MG TABS tablet Take 10 mg by mouth daily.   01/23/2019 at Unknown time  . HYDROcodone-acetaminophen (NORCO) 10-325 MG tablet Take 1 tablet by mouth 4 (four) times daily.    01/23/2019 at Unknown time  . insulin aspart (NOVOLOG) 100 UNIT/ML injection Inject 15 Units into the skin 2 (two) times daily.   01/23/2019 at Unknown time  . omeprazole (PRILOSEC) 40 MG  capsule Take 1 capsule (40 mg total) by mouth 2 (two) times daily for 14 days. 28 capsule 0 01/23/2019 at Unknown time  . pregabalin (LYRICA) 150 MG capsule Take 150 mg by mouth 2 (two) times daily.   01/23/2019 at Unknown time  . rosuvastatin (CRESTOR) 20 MG tablet Take 20 mg by mouth daily.   01/23/2019 at Unknown time  . lipase/protease/amylase (CREON) 36000 UNITS CPEP capsule Take 1 capsule (36,000 Units total) by mouth 2 (two) times daily with a meal. Take this with snacks up to twice a day. (Patient not taking: Reported on 01/24/2019) 180 capsule 0 Not Taking at Unknown time   Home medication reconciliation was completed with the patient.   Scheduled Inpatient Medications:   . amLODipine  10 mg Oral Daily  . dextromethorphan-guaiFENesin  1 tablet Oral BID  . insulin aspart  0-5 Units Subcutaneous QHS  . insulin aspart  0-9 Units Subcutaneous TID WC  . insulin detemir  6 Units Subcutaneous QHS  . lipase/protease/amylase  36,000 Units Oral BID WC  . nystatin   Topical BID  . pantoprazole  40 mg Oral BID  . pregabalin  150 mg Oral BID  . rosuvastatin  20 mg Oral Daily    Continuous Inpatient Infusions:   . sodium chloride 75 mL/hr at 01/27/19 0840  .  cefTRIAXone (ROCEPHIN)  IV 2 g (01/27/19 1220)    PRN Inpatient Medications:  albuterol, cyclobenzaprine, hydrALAZINE, HYDROcodone-acetaminophen, morphine injection, ondansetron (ZOFRAN) IV, traZODone  Family History: Family history is unknown by patient.   GI Family History: Negative.  Social History:   reports that she has quit smoking. She has never used smokeless tobacco. She reports previous alcohol use. She reports that she does not use drugs. The patient denies ETOH, tobacco, or drug use.    Review of Systems: Review of Systems - History obtained from the patient General ROS: positive for  - fatigue negative for - weight loss Psychological ROS: positive for - anxiety negative for - hallucinations, memory difficulties,  obsessive thoughts or suicidal ideation ENT ROS: negative Allergy and Immunology ROS: negative Hematological and Lymphatic ROS: negative Endocrine ROS: positive for - hair pattern changes, palpitations and polydipsia/polyuria negative for - temperature intolerance Respiratory ROS: no cough, shortness of breath, or wheezing Cardiovascular ROS: no chest pain or dyspnea on exertion Gastrointestinal ROS: As in the history of present illness. Genito-Urinary ROS: positive for - dysuria Musculoskeletal ROS: positive for - pain in shoulder - left Neurological ROS: no TIA or stroke symptoms Dermatological ROS: negative  Physical Examination: BP (!) 143/73 (BP Location: Right Arm)   Pulse 89   Temp 98.2 F (36.8 C) (Oral)   Resp 18   Ht 5\' 1"  (1.549 m)   Wt 52.2 kg   SpO2 99%   BMI 21.73 kg/m  Physical Exam Vitals and nursing note reviewed.  Constitutional:      Appearance: Normal appearance.  HENT:     Head: Normocephalic and atraumatic.     Nose: Nose normal.  Eyes:     Conjunctiva/sclera: Conjunctivae normal.     Pupils: Pupils are equal, round, and reactive to light.  Cardiovascular:     Rate and Rhythm: Normal rate.     Pulses: Normal pulses.     Heart sounds: Normal heart sounds.  Pulmonary:     Effort: Pulmonary effort is normal.     Breath sounds: Normal breath sounds.  Abdominal:     General: There is distension.     Tenderness: There is abdominal tenderness.  Musculoskeletal:        General: Normal range of motion.  Skin:    General: Skin is warm and dry.  Neurological:     General: No focal deficit present.     Mental Status: She is alert.  Psychiatric:        Mood and Affect: Mood normal.        Behavior: Behavior normal.     Data: Lab Results  Component Value Date   WBC 6.3 01/27/2019   HGB 11.0 (L) 01/27/2019   HGB 10.9 (L) 01/27/2019   HCT 34.5 (L) 01/27/2019   MCV 87.8 01/27/2019   PLT 182 01/27/2019   Recent Labs  Lab 01/24/19 0950  01/27/19 0500  HGB 12.3 10.9*  11.0*   Lab Results  Component Value Date   NA 143 01/27/2019   K 4.2 01/27/2019   CL 111 01/27/2019   CO2 24 01/27/2019   BUN 10 01/27/2019   CREATININE 1.06 (H) 01/27/2019   Lab Results  Component Value Date   ALT 51 (H) 01/27/2019   AST 34 01/27/2019   ALKPHOS 225 (H) 01/27/2019   BILITOT 0.5 01/27/2019   No results for input(s): APTT, INR, PTT in the last 168 hours. CBC Latest Ref Rng & Units 01/27/2019 01/27/2019 01/24/2019  WBC 4.0 -  10.5 K/uL 6.3 - 14.1(H)  Hemoglobin 12.0 - 15.0 g/dL 10.9(L) 11.0(L) 12.3  Hematocrit 36.0 - 46.0 % 34.5(L) - 37.2  Platelets 150 - 400 K/uL 182 - 196    STUDIES: MR SHOULDER LEFT WO CONTRAST  Result Date: 01/25/2019 CLINICAL DATA:  Left shoulder pain and limited range of motion for several months. No known injury. EXAM: MRI OF THE LEFT SHOULDER WITHOUT CONTRAST TECHNIQUE: Multiplanar, multisequence MR imaging of the shoulder was performed. No intravenous contrast was administered. COMPARISON:  None. FINDINGS: Rotator cuff: Intact. Heterogeneously increased T2 signal in the rotator cuff tendons is most notable in the supraspinatus. Muscles:  Normal. No atrophy or focal lesion Biceps long head:  Intact. Acromioclavicular Joint: Minimal osteoarthritis. Type 2 acromion. Small volume of subacromial/subdeltoid fluid noted Glenohumeral Joint: There is thickening and intermediate increased T2 signal in the inferior glenohumeral ligament compatible with adhesive capsulitis. Otherwise negative Labrum:  Intact. Bones:  Negative. Other: None. IMPRESSION: 1. Findings compatible with adhesive capsulitis. 2. Rotator cuff tendinopathy without tear. 3. Minimal acromioclavicular osteoarthritis. 4. Small volume of subacromial/subdeltoid fluid compatible with bursitis. Electronically Signed   By: Inge Rise M.D.   On: 01/25/2019 14:21   US Abdomen Limited RUQ  Result Date: 01/27/2019 CLINICAL DATA:  Increasing abdominal pain EXAM:  ULTRASOUND ABDOMEN LIMITED RIGHT UPPER QUADRANT COMPARISON:  01/24/2010 FINDINGS: Gallbladder: Gallbladder is well distended. Mild gallbladder sludge is seen. The wall is mildly thickened at 3.9 mm with a negative sonographic Murphy's sign. This may be related to incomplete distension. Common bile duct: Diameter: 4.2 mm. Liver: Previously seen hyperechoic lesion is not well appreciated on today's exam due to patient positioning. No acute abnormality in the liver is seen. Portal vein is patent on color Doppler imaging with normal direction of blood flow towards the liver. Other: None. IMPRESSION: Mild gallbladder sludge. Mild gallbladder wall thickening is seen although this is felt to be related to incomplete distension of the gallbladder. No pericholecystic fluid is seen and a negative sonographic Murphy's sign is noted. No acute abnormality is seen. Electronically Signed   By: Inez Catalina M.D.   On: 01/27/2019 09:48   @IMAGES @  Assessment:  1. Acute on chronic abdominal pain-symptoms appear to be suggestive of gastritis/severe GERD with possible dysphagia relating to esophagitis versus esophageal stricture.  2. Dysphagia-likely related to uncontrolled GERD with or without mechanical blockage in the form of a stricture or web or ring. Cannot rule out malignancy at this time without proper luminal evaluation.  3. Right upper quadrant epigastric abdominal pain.  4. Constipation with hematochezia-this will require some endoluminal evaluation at a later time, it is doubtful that we will get done during this hospitalization due to persistent constipation, nausea and vomiting and likely inability to tolerate an oral got lavage preparation. Patient is colonoscopy nave and overdue for colon cancer screening.  5. Mild anemia-likely chronic secondary to chronic medical issues, however, cannot rule out influence from bleeding or GI blood loss.  COVID-19 status: Tested negative  Recommendations:  1. Acid  suppression. 2. Continue IV fluid support and antiemetics as needed. 3. N.p.o. after midnight. 4. EGD will be planned for tomorrow.The patient understands the nature of the planned procedure, indications, risks, alternatives and potential complications including but not limited to bleeding, infection, perforation, damage to internal organs and possible oversedation/side effects from anesthesia. The patient agrees and gives consent to proceed.  Please refer to procedure notes for findings, recommendations and patient disposition/instructions.   Thank you for the consult. Please call  with questions or concerns.  Olean Ree, "Lanny Hurst MD [facility] [place], [place] [address] [phone]  01/27/2019 12:55 PM

## 2019-01-27 NOTE — Progress Notes (Signed)
Occupational Therapy Treatment Patient Details Name: Karen [name] MRN: [id number] DOB: [birth year] Today's Date: 01/27/2019    History of present illness Per MD notes: Pt is a 64 y.o. female with medical history significant of hypertension, hyperlipidemia, diabetes mellitus, asthma, GERD, depression, anxiety, DKA, alcoholic pancreatitis, CKD-3, alcohol abuse in remission, who presents with nausea vomiting, abdominal pain, L shoulder pain, and dysuria.  MD assessment includes: Recurrent pancreatitis, UTI, abnormal LTFs, HTN, Type II diabetes mellitus with renal manifestations, Acute renal failure superimposed on stage 3a chronic kidney disease, and L shoulder adhesive capsulitis.   OT comments  Pt seen for OT tx this date. Pt reporting 5/10 abdominal pain and L shoulder pain. Pt instructed in RW mgt and strategies for performing ADL and IADL tasks in standing at sink vs counter or negotiating kitchen with RW to maximize safety and independence. Pt instructed in cognitive behavioral pain coping strategies to support improved self mgt of pain. Pt verbalized understanding. Pt continues to benefit from skilled OT services to maximize return to PLOF and minimize risk of falls and caregiver burden. Continue to recommend [place].   Follow Up Recommendations  Home health OT    Equipment Recommendations  Tub/shower bench;Other (comment)(LH sponge, HH showerhead, reacher)    Recommendations for Other Services      Precautions / Restrictions Precautions Precautions: Fall Restrictions Weight Bearing Restrictions: No       Mobility Bed Mobility                  Transfers                      Balance                                           ADL either performed or assessed with clinical judgement   ADL Overall ADL's : Needs assistance/impaired                                       General ADL Comments: Supervision to PRN Min A for ADL tasks  2/2 limited L shoulder functional use and pain     Vision Patient Visual Report: No change from baseline     Perception     Praxis      Cognition Arousal/Alertness: Awake/alert Behavior During Therapy: WFL for tasks assessed/performed Overall Cognitive Status: Within Functional Limits for tasks assessed                                          Exercises Other Exercises Other Exercises: Pt instructed in RW mgt and strategies for performing ADL and IADL tasks in standing at sink or negotiating kitchen with RW to maximize safety and independence Other Exercises: Pt instructed in cognitive behavioral pain coping strategies to support improved self mgt of pain   Shoulder Instructions       General Comments      Pertinent Vitals/ Pain       Pain Assessment: 0-10 Pain Score: 5  Pain Location: L shoulder and abdomen Pain Descriptors / Indicators: Aching Pain Intervention(s): Limited activity within patient's tolerance;Monitored during session;Repositioned  Home Living  Prior Functioning/Environment              Frequency  Min 1X/week        Progress Toward Goals  OT Goals(current goals can now be found in the care plan section)  Progress towards OT goals: Progressing toward goals  Acute Rehab OT Goals Patient Stated Goal: Improved balance and use of L arm OT Goal Formulation: With patient Time For Goal Achievement: 02/08/19 Potential to Achieve Goals: Good  Plan Discharge plan remains appropriate;Frequency remains appropriate    Co-evaluation                 AM-PAC OT "6 Clicks" Daily Activity     Outcome Measure   Help from another person eating meals?: None Help from another person taking care of personal grooming?: A Little Help from another person toileting, which includes using toliet, bedpan, or urinal?: None Help from another person bathing (including washing,  rinsing, drying)?: A Little Help from another person to put on and taking off regular upper body clothing?: A Little Help from another person to put on and taking off regular lower body clothing?: None 6 Click Score: 21    End of Session    OT Visit Diagnosis: Unsteadiness on feet (R26.81);Muscle weakness (generalized) (M62.81)   Activity Tolerance Patient tolerated treatment well   Patient Left in bed;with call bell/phone within reach;with bed alarm set   Nurse Communication          Time: [id number] OT Time Calculation (min): 24 min  Charges: OT General Charges $OT Visit: 1 Visit OT Treatments $Self Care/Home Management : 8-22 mins $Therapeutic Activity: 8-22 mins  Jeni Salles, MPH, MS, OTR/L ascom [phone] 01/27/19, 12:22 PM

## 2019-01-27 NOTE — Plan of Care (Signed)
Continuing with plan of care. 

## 2019-01-27 NOTE — Progress Notes (Signed)
Physical Therapy Treatment Patient Details Name: Karen [name] MRN: [id number] DOB: 03-22-1955 Today's Date: 01/27/2019    History of Present Illness Per MD notes: Pt is a 64 y.o. female with medical history significant of hypertension, hyperlipidemia, diabetes mellitus, asthma, GERD, depression, anxiety, DKA, alcoholic pancreatitis, CKD-3, alcohol abuse in remission, who presents with nausea vomiting, abdominal pain, L shoulder pain, and dysuria.  MD assessment includes: Recurrent pancreatitis, UTI, abnormal LTFs, HTN, Type II diabetes mellitus with renal manifestations, Acute renal failure superimposed on stage 3a chronic kidney disease, and L shoulder adhesive capsulitis.    PT Comments    Pt motivated to participate throughout the session.  Pt tolerated L shoulder ROM exercises without increased pain but remains limited to grossly 90 deg of shoulder flex and abd and 15-20 deg of ER.  Pt presented with grossly improved static and dynamic standing balance training with less frequent physical assist required to prevent LOB during below balance training.  Pt steady during amb with the RW without LOB.  Pt will benefit from HHPT services upon discharge to safely address deficits listed in patient problem list for decreased caregiver assistance and eventual return to PLOF.    Follow Up Recommendations  Home health PT;Supervision - Intermittent     Equipment Recommendations  Rolling walker with 5" wheels    Recommendations for Other Services       Precautions / Restrictions Precautions Precautions: Fall Restrictions Weight Bearing Restrictions: No    Mobility  Bed Mobility Overal bed mobility: Independent                Transfers Overall transfer level: Needs assistance Equipment used: None Transfers: Sit to/from Stand Sit to Stand: Supervision         General transfer comment: Good eccentric and concentric control with transfers  Ambulation/Gait Ambulation/Gait  assistance: Supervision Gait Distance (Feet): 150 Feet Assistive device: Rolling walker (2 wheeled) Gait Pattern/deviations: Step-through pattern;Decreased step length - right;Decreased step length - left Gait velocity: decreased   General Gait Details: Fair to good stability with a RW   Chief Strategy Officer    Modified Rankin (Stroke Patients Only)       Balance Overall balance assessment: Needs assistance   Sitting balance-Leahy Scale: Normal     Standing balance support: Bilateral upper extremity supported;During functional activity Standing balance-Leahy Scale: Fair                              Cognition Arousal/Alertness: Awake/alert Behavior During Therapy: WFL for tasks assessed/performed Overall Cognitive Status: Within Functional Limits for tasks assessed                                        Exercises Other Exercises Other Exercises: Gentle L shoulder AA/PROM with contract/relax and gentle stretch at end range in flex, scaption, ER and abd Other Exercises: Static and dynamic standing balance training without UE support with various foot positions, eyes open/closed, and reaching outside BOS    General Comments        Pertinent Vitals/Pain Pain Assessment: 0-10 Pain Score: 7  Pain Location: L shoulder and abdomen Pain Descriptors / Indicators: Aching;Sore Pain Intervention(s): Premedicated before session;Monitored during session    Home Living  Prior Function            PT Goals (current goals can now be found in the care plan section) Progress towards PT goals: Progressing toward goals    Frequency    Min 2X/week      PT Plan Current plan remains appropriate    Co-evaluation              AM-PAC PT "6 Clicks" Mobility   Outcome Measure  Help needed turning from your back to your side while in a flat bed without using bedrails?: None Help needed  moving from lying on your back to sitting on the side of a flat bed without using bedrails?: None Help needed moving to and from a bed to a chair (including a wheelchair)?: A Little Help needed standing up from a chair using your arms (e.g., wheelchair or bedside chair)?: A Little Help needed to walk in hospital room?: A Little Help needed climbing 3-5 steps with a railing? : A Little 6 Click Score: 20    End of Session Equipment Utilized During Treatment: Gait belt Activity Tolerance: Patient tolerated treatment well Patient left: in chair;with call bell/phone within reach Nurse Communication: Mobility status PT Visit Diagnosis: Unsteadiness on feet (R26.81);Difficulty in walking, not elsewhere classified (R26.2);Muscle weakness (generalized) (M62.81)     Time: [id number] PT Time Calculation (min) (ACUTE ONLY): 25 min  Charges:  $Therapeutic Exercise: 8-22 mins $Therapeutic Activity: 8-22 mins                     D. Royetta Asal PT, DPT 01/27/19, 5:36 PM

## 2019-01-28 ENCOUNTER — Inpatient Hospital Stay: Payer: Medicaid Other | Admitting: Anesthesiology

## 2019-01-28 ENCOUNTER — Encounter
Admission: EM | Disposition: A | Discharge status: Home Health | Payer: Self-pay | Source: Home / Self Care | Attending: Internal Medicine

## 2019-01-28 HISTORY — PX: ESOPHAGOGASTRODUODENOSCOPY (EGD) WITH PROPOFOL: SHX5813

## 2019-01-28 LAB — GLUCOSE, CAPILLARY
Glucose-Capillary: 118 mg/dL — ABNORMAL HIGH (ref 70–99)
Glucose-Capillary: 112 mg/dL — ABNORMAL HIGH (ref 70–99)
Glucose-Capillary: 113 mg/dL — ABNORMAL HIGH (ref 70–99)
Glucose-Capillary: 221 mg/dL — ABNORMAL HIGH (ref 70–99)

## 2019-01-28 LAB — HEMOGLOBIN: Hemoglobin: 10.5 g/dL — ABNORMAL LOW (ref 12.0–15.0)

## 2019-01-28 SURGERY — ESOPHAGOGASTRODUODENOSCOPY (EGD) WITH PROPOFOL
Anesthesia: General

## 2019-01-28 MED ORDER — PANCRELIPASE (LIP-PROT-AMYL) 12000-38000 UNITS PO CPEP
72000.0000 [IU] | ORAL_CAPSULE | Freq: Three times a day (TID) | ORAL | Status: DC
  Administered 2019-01-28 – 2019-01-29 (×3): 72000 [IU] via ORAL
  Filled 2019-01-28 (×4): qty 6

## 2019-01-28 MED ORDER — PROPOFOL 500 MG/50ML IV EMUL
INTRAVENOUS | Status: AC
  Filled 2019-01-28: qty 50

## 2019-01-28 MED ORDER — SODIUM CHLORIDE 0.9 % IV SOLN: INTRAVENOUS | Status: DC

## 2019-01-28 MED ORDER — PROPOFOL 500 MG/50ML IV EMUL
INTRAVENOUS | Status: DC | PRN
  Administered 2019-01-28: 50 ug/kg/min via INTRAVENOUS

## 2019-01-28 NOTE — Op Note (Signed)
[facility] Gastroenterology Patient Name: Karen [name] Procedure Date: 01/28/2019 2:08 PM MRN: [id number] Account #: [id number] Date of Birth: 10-Sep-1955 Admit Type: Inpatient Age: 64 Room: [facility] ENDO ROOM 4 Gender: Female Note Status: Finalized Procedure:             Upper GI endoscopy Indications:           Epigastric abdominal pain, Nausea with vomiting Providers:             Benay Pike. Ashantae Pangallo MD, MD Medicines:             Propofol per Anesthesia Complications:         No immediate complications. Procedure:             Pre-Anesthesia Assessment:                        - The risks and benefits of the procedure and the                         sedation options and risks were discussed with the                         patient. All questions were answered and informed                         consent was obtained.                        - Patient identification and proposed procedure were                         verified prior to the procedure by the nurse. The                         procedure was verified in the procedure room.                        - ASA Grade Assessment: III - A patient with severe                         systemic disease.                        - After reviewing the risks and benefits, the patient                         was deemed in satisfactory condition to undergo the                         procedure.                        After obtaining informed consent, the endoscope was                         passed under direct vision. Throughout the procedure,                         the patient's blood pressure, pulse, and oxygen  saturations were monitored continuously. The Endoscope                         was introduced through the mouth, and advanced to the                         third part of duodenum. The upper GI endoscopy was                         accomplished without difficulty. The patient tolerated            the procedure well. Findings:      The esophagus was normal.      The stomach was normal.      The examined duodenum was normal. Impression:            - Normal esophagus.                        - Normal stomach.                        - Normal examined duodenum.                        - No specimens collected. Recommendation:        - Return patient to hospital ward for possible                         discharge same day.                        - Full liquid diet.                        - Advance diet as tolerated.                        - Follow up as needed. GI sign off. Procedure Code(s):     --- Professional ---                        [phone], Esophagogastroduodenoscopy, flexible,                         transoral; diagnostic, including collection of                         specimen(s) by brushing or washing, when performed                         (separate procedure) Diagnosis Code(s):     --- Professional ---                        R11.2, Nausea with vomiting, unspecified                        R10.13, Epigastric pain CPT copyright 2019 American Medical Association. All rights reserved. The codes documented in this report are preliminary and upon coder review may  be revised to meet current compliance requirements. Efrain Sella MD, MD 01/28/2019 2:35:52 PM This report has been signed electronically. Number of Addenda:  0 Note Initiated On: 01/28/2019 2:08 PM Estimated Blood Loss:  Estimated blood loss: none.      [facility]

## 2019-01-28 NOTE — Progress Notes (Signed)
Patient ID: Karen [name], female   DOB: April 05, 1955, 64 y.o.   MRN: [id number] [facility] PROGRESS NOTE  Karen [name] [id number] DOB: September 12, 1955 DOA: 01/24/2019 PCP: Novella Rob, FNP  HPI/Subjective: Patient seen this morning and is n.p.o. for endoscopy.  Patient still having abdominal pain.  Even when she is home she states she has abdominal pain that comes and goes.  She takes pain medications 4 times a day at home.  Objective: Vitals:   01/28/19 0900 01/28/19 1208  BP: 136/78 131/66  Pulse:  88  Resp:  17  Temp:  99.2 F (37.3 C)  SpO2:  97%    Intake/Output Summary (Last 24 hours) at 01/28/2019 1258 Last data filed at 01/28/2019 0500 Gross per 24 hour  Intake 2219.75 ml  Output --  Net 2219.75 ml   Filed Weights   01/24/19 0935  Weight: 52.2 kg    ROS: Review of Systems  Constitutional: Negative for chills and fever.  Eyes: Negative for blurred vision.  Respiratory: Negative for cough and shortness of breath.   Cardiovascular: Negative for chest pain.  Gastrointestinal: Positive for abdominal pain. Negative for diarrhea, nausea and vomiting.  Genitourinary: Negative for dysuria.  Musculoskeletal: Positive for joint pain.  Neurological: Negative for dizziness and headaches.   Exam: Physical Exam  Constitutional: She is oriented to person, place, and time.  HENT:  Nose: No mucosal edema.  Mouth/Throat: No oropharyngeal exudate or posterior oropharyngeal edema.  Eyes: Pupils are equal, round, and reactive to light. Conjunctivae, EOM and lids are normal.  Neck: Carotid bruit is not present.  Cardiovascular: S1 normal and S2 normal. Exam reveals no gallop.  No murmur heard. Pulses:      Dorsalis pedis pulses are 2+ on the right side and 2+ on the left side.  Respiratory: No respiratory distress. She has no wheezes. She has no rhonchi. She has no rales.  GI: Soft. Bowel sounds are normal. There is abdominal tenderness.  Musculoskeletal:     Left  shoulder: Decreased range of motion.     Right ankle: No swelling.     Left ankle: No swelling.  Lymphadenopathy:    She has no cervical adenopathy.  Neurological: She is alert and oriented to person, place, and time. No cranial nerve deficit.  Skin: Skin is warm. No rash noted. Nails show no clubbing.  Psychiatric: She has a normal mood and affect.      Data Reviewed: Basic Metabolic Panel: Recent Labs  Lab 01/24/19 0950 01/25/19 0547 01/27/19 0500  NA 132* 142 143  K 4.4 4.1 4.2  CL 95* 109 111  CO2 24 26 24   GLUCOSE 420* 135* 124*  BUN 31* 20 10  CREATININE 1.54* 1.06* 1.06*  CALCIUM 8.9 8.7* 8.5*   Liver Function Tests: Recent Labs  Lab 01/24/19 0950 01/25/19 0547 01/27/19 0500  AST 73* 31 34  ALT 100* 70* 51*  ALKPHOS 302* 232* 225*  BILITOT 0.8 0.9 0.5  PROT 8.2* 7.1 7.2  ALBUMIN 3.8 3.5 3.3*   Recent Labs  Lab 01/24/19 0950 01/25/19 0547 01/27/19 0500  LIPASE 163* 60* 146*  AMYLASE  --   --  103*   CBC: Recent Labs  Lab 01/24/19 0950 01/27/19 0500 01/28/19 0647  WBC 14.1* 6.3  --   NEUTROABS  --  3.4  --   HGB 12.3 10.9*  11.0* 10.5*  HCT 37.2 34.5*  --   MCV 84.5 87.8  --   PLT 196 182  --  BNP (last 3 results) Recent Labs    03/12/18 0844  BNP 28.0     CBG: Recent Labs  Lab 01/27/19 1636 01/27/19 2022 01/27/19 2300 01/28/19 0803 01/28/19 1206  GLUCAP 178* 171* 182* 118* 112*    Recent Results (from the past 240 hour(s))  Respiratory Panel by RT PCR (Flu A&B, Covid) - Nasopharyngeal Swab     Status: None   Collection Time: 01/24/19 10:33 AM   Specimen: Nasopharyngeal Swab  Result Value Ref Range Status   SARS Coronavirus 2 by RT PCR NEGATIVE NEGATIVE Final    Comment: (NOTE) SARS-CoV-2 target nucleic acids are NOT DETECTED. The SARS-CoV-2 RNA is generally detectable in upper respiratoy specimens during the acute phase of infection. The lowest concentration of SARS-CoV-2 viral copies this assay can detect is 131  copies/mL. A negative result does not preclude SARS-Cov-2 infection and should not be used as the sole basis for treatment or other patient management decisions. A negative result may occur with  improper specimen collection/handling, submission of specimen other than nasopharyngeal swab, presence of viral mutation(s) within the areas targeted by this assay, and inadequate number of viral copies (<131 copies/mL). A negative result must be combined with clinical observations, patient history, and epidemiological information. The expected result is Negative. Fact Sheet for Patients:  PinkCheek.be Fact Sheet for Healthcare Providers:  GravelBags.it This test is not yet ap proved or cleared by the [place] FDA and  has been authorized for detection and/or diagnosis of SARS-CoV-2 by FDA under an Emergency Use Authorization (EUA). This EUA will remain  in effect (meaning this test can be used) for the duration of the COVID-19 declaration under Section 564(b)(1) of the Act, 21 U.S.C. section 360bbb-3(b)(1), unless the authorization is terminated or revoked sooner.    Influenza A by PCR NEGATIVE NEGATIVE Final   Influenza B by PCR NEGATIVE NEGATIVE Final    Comment: (NOTE) The Xpert Xpress SARS-CoV-2/FLU/RSV assay is intended as an aid in  the diagnosis of influenza from Nasopharyngeal swab specimens and  should not be used as a sole basis for treatment. Nasal washings and  aspirates are unacceptable for Xpert Xpress SARS-CoV-2/FLU/RSV  testing. Fact Sheet for Patients: PinkCheek.be Fact Sheet for Healthcare Providers: GravelBags.it This test is not yet approved or cleared by the [place] FDA and  has been authorized for detection and/or diagnosis of SARS-CoV-2 by  FDA under an Emergency Use Authorization (EUA). This EUA will remain  in effect (meaning this test can  be used) for the duration of the  Covid-19 declaration under Section 564(b)(1) of the Act, 21  U.S.C. section 360bbb-3(b)(1), unless the authorization is  terminated or revoked. Performed at [facility], [address] [place], [place] [address]   Urine culture     Status: Abnormal   Collection Time: 01/24/19 10:33 AM   Specimen: Urine, Random  Result Value Ref Range Status   Specimen Description   Final    URINE, RANDOM Performed at [facility], [address], [place], [place] [address]    Special Requests   Final    NONE Performed at [facility], [address] [place], [place] [address]    Culture (A)  Final    80,000 COLONIES/mL GROUP B STREP(S.AGALACTIAE)ISOLATED TESTING AGAINST S. AGALACTIAE NOT ROUTINELY PERFORMED DUE TO PREDICTABILITY OF AMP/PEN/VAN SUSCEPTIBILITY. Performed at [facility], [place] [address], [place], [place] [address]    Report Status 01/25/2019 FINAL  Final  CULTURE, BLOOD (ROUTINE X  2) w Reflex to ID Panel     Status: None (Preliminary result)   Collection Time: 01/24/19  5:01 PM   Specimen: BLOOD  Result Value Ref Range Status   Specimen Description BLOOD LEFT ANTECUBITAL  Final   Special Requests   Final    BOTTLES DRAWN AEROBIC AND ANAEROBIC Blood Culture adequate volume   Culture   Final    NO GROWTH 4 DAYS Performed at [facility], [address], [place], [place] [address]    Report Status PENDING  Incomplete  CULTURE, BLOOD (ROUTINE X 2) w Reflex to ID Panel     Status: None (Preliminary result)   Collection Time: 01/24/19  5:01 PM   Specimen: BLOOD  Result Value Ref Range Status   Specimen Description BLOOD BLOOD RIGHT WRIST  Final   Special Requests   Final    BOTTLES DRAWN AEROBIC AND ANAEROBIC Blood Culture adequate volume   Culture   Final    NO GROWTH 4 DAYS Performed at [facility], [address] [place], [place] [address]    Report Status PENDING  Incomplete      Studies: US Abdomen Limited RUQ  Result Date: 01/27/2019 CLINICAL DATA:  Increasing abdominal pain EXAM: ULTRASOUND ABDOMEN LIMITED RIGHT UPPER QUADRANT COMPARISON:  01/24/2010 FINDINGS: Gallbladder: Gallbladder is well distended. Mild gallbladder sludge is seen. The wall is mildly thickened at 3.9 mm with a negative sonographic Murphy's sign. This may be related to incomplete distension. Common bile duct: Diameter: 4.2 mm. Liver: Previously seen hyperechoic lesion is not well appreciated on today's exam due to patient positioning. No acute abnormality in the liver is seen. Portal vein is patent on color Doppler imaging with normal direction of blood flow towards the liver. Other: None. IMPRESSION: Mild gallbladder sludge. Mild gallbladder wall thickening is seen although this is felt to be related to incomplete distension of the gallbladder. No pericholecystic fluid is seen and a negative sonographic Murphy's sign is noted. No acute abnormality is seen. Electronically Signed   By: Inez Catalina M.D.   On: 01/27/2019 09:48    Scheduled Meds: . amLODipine  10 mg Oral Daily  . dextromethorphan-guaiFENesin  1 tablet Oral BID  . insulin aspart  0-5 Units Subcutaneous QHS  . insulin aspart  0-9 Units Subcutaneous TID WC  . insulin detemir  6 Units Subcutaneous QHS  . lipase/protease/amylase  72,000 Units Oral TID WC  . nystatin   Topical BID  . pantoprazole  40 mg Oral BID  . pregabalin  150 mg Oral BID  . rosuvastatin  20 mg Oral Daily   Continuous Infusions: . sodium chloride 50 mL/hr at 01/28/19 0718  . cefTRIAXone (ROCEPHIN)  IV 2 g (01/28/19 1149)    Assessment/Plan:  1. Acute on chronic pancreatitis.  IV fluid hydration and pain control.  Patient does have chronic pain.  I will increase her Creon to 72,000 3 times daily.  Endoscopy for today.  Check labs again tomorrow.  Prior to disposition will need to tolerate solid food for few meals. 2. Acute cystitis without hematuria.  Patient on  Rocephin.  Group B strep growing out a culture. 3. Left shoulder pain.  MRI showing adhesive capsulitis.  Appreciate orthopedic consultation. 4. Type 2 diabetes mellitus with hyperlipidemia.  Low-dose Levemir. 5. Essential hypertension on amlodipine  Code Status:     Code Status Orders  (From admission, onward)         Start     Ordered  01/24/19 1613  Full code  Continuous     01/24/19 1612        Code Status History    Date Active Date Inactive Code Status Order ID Comments User Context   09/25/2018 1442 09/29/2018 2252 Full Code [id number]  Demetrios Loll, MD ED   11/11/2017 1417 11/13/2017 1948 Full Code [id number]  Hillary Bow, MD ED   04/25/2017 1039 04/26/2017 1415 Full Code [id number]  Schuyler Amor, MD ED   01/03/2017 1730 01/07/2017 1531 Full Code [id number]  Idelle Crouch, MD Inpatient   12/10/2016 1201 12/12/2016 1712 Full Code [id number]  Demetrios Loll, MD Inpatient   10/15/2016 2350 10/17/2016 0056 Full Code [id number]  Dustin Flock, MD Inpatient   04/21/2016 0505 04/24/2016 2031 Full Code [id number]  Saundra Shelling, MD Inpatient   12/31/2015 0739 01/01/2016 2008 Full Code [id number]  Saundra Shelling, MD Inpatient   12/10/2015 1109 12/12/2015 1848 Full Code [id number]  Demetrios Loll, MD Inpatient   10/29/2015 1545 11/01/2015 1623 Full Code [id number]  Theodoro Grist, MD Inpatient   02/17/2015 1704 02/19/2015 1804 Full Code [id number]  Nicholes Mango, MD Inpatient   Advance Care Planning Activity     Disposition Plan: Endoscopy today.  Patient will need to tolerate solid food for at least a few meals prior to disposition.  Evaluate tomorrow.  Consultants:  Orthopedic surgery  Gastroenterology  Time spent: 27 minutes.    [place]  [facility] [employer]

## 2019-01-28 NOTE — Anesthesia Preprocedure Evaluation (Addendum)
Anesthesia Evaluation  Patient identified by MRN, date of birth, ID band Patient awake    Reviewed: Allergy & Precautions, H&P , NPO status , Patient's Chart, lab work & pertinent test results  Airway Mallampati: III   Neck ROM: full  Mouth opening: Limited Mouth Opening Comment: Small mouth Dental  (+) Edentulous Upper, Missing   Pulmonary asthma , former smoker,           Cardiovascular hypertension, (-) angina     Neuro/Psych PSYCHIATRIC DISORDERS Depression negative neurological ROS     GI/Hepatic GERD  ,(+)     substance abuse  alcohol use, Chronic pancreatitis Lipase vacillating, pancreas normal on CT   Endo/Other  diabetes  Renal/GU Renal disease  negative genitourinary   Musculoskeletal   Abdominal   Peds  Hematology negative hematology ROS (+)   Anesthesia Other Findings Past Medical History: 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 No date: Asthma No date: Depression No date: Diabetes mellitus without complication ([place]) [id number]: DKA (diabetic ketoacidoses) (HCC) 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 No date: Hypercholesteremia No date: Hypertension No date: Hypokalemia  Past Surgical History: 1996: ABDOMINAL HYSTERECTOMY 1997: APPENDECTOMY 11/12/2017: ESOPHAGOGASTRODUODENOSCOPY; N/A     Comment:  Procedure: ESOPHAGOGASTRODUODENOSCOPY (EGD);  Surgeon:               Lin Landsman, MD;  Location: [facility] ENDOSCOPY;                Service: Gastroenterology;  Laterality: N/A; 09/29/2018: ESOPHAGOGASTRODUODENOSCOPY; N/A     Comment:  Procedure: ESOPHAGOGASTRODUODENOSCOPY (EGD);  Surgeon:               [place], Benay Pike, MD;  Location: [facility] ENDOSCOPY;                Service: Gastroenterology;  Laterality: N/A; 04/24/2016: ESOPHAGOGASTRODUODENOSCOPY (EGD) WITH PROPOFOL; N/A     Comment:  Procedure: ESOPHAGOGASTRODUODENOSCOPY (EGD) WITH               PROPOFOL;  Surgeon: [name], MD;  Location: [facility]       ENDOSCOPY;  Service: Endoscopy;  Laterality: N/A; 1988: HAND SURGERY 2013: THYROID SURGERY  BMI    Body Mass Index: 21.73 kg/m      Reproductive/Obstetrics negative OB ROS                           Anesthesia Physical Anesthesia Plan  ASA: III  Anesthesia Plan: General   Post-op Pain Management:    Induction:   PONV Risk Score and Plan:   Airway Management Planned:   Additional Equipment:   Intra-op Plan:   Post-operative Plan:   Informed Consent: I have reviewed the patients History and Physical, chart, labs and discussed the procedure including the risks, benefits and alternatives for the proposed anesthesia with the patient or authorized representative who has indicated his/her understanding and acceptance.     Dental Advisory Given  Plan Discussed with: Anesthesiologist  Anesthesia Plan Comments:        Anesthesia Quick Evaluation

## 2019-01-28 NOTE — Anesthesia Postprocedure Evaluation (Signed)
Anesthesia Post Note  Patient: Karen [name]  Procedure(s) Performed: ESOPHAGOGASTRODUODENOSCOPY (EGD) WITH PROPOFOL (N/A )  Patient location during evaluation: PACU Anesthesia Type: General Level of consciousness: awake and alert Pain management: pain level controlled Vital Signs Assessment: post-procedure vital signs reviewed and stable Respiratory status: spontaneous breathing, nonlabored ventilation and respiratory function stable Cardiovascular status: blood pressure returned to baseline and stable Postop Assessment: no apparent nausea or vomiting Anesthetic complications: no     Last Vitals:  Vitals:   01/28/19 1454 01/28/19 1500  BP: 122/74 (!) 149/80  Pulse: 80 86  Resp:  16  Temp:  36.7 C  SpO2: 97% 98%    Last Pain:  Vitals:   01/28/19 1600  TempSrc:   PainSc: [place]

## 2019-01-28 NOTE — Plan of Care (Signed)
Continuing with plan of care. 

## 2019-01-28 NOTE — Transfer of Care (Signed)
Immediate Anesthesia Transfer of Care Note  Patient: Karen [name]  Procedure(s) Performed: ESOPHAGOGASTRODUODENOSCOPY (EGD) WITH PROPOFOL (N/A )  Patient Location: PACU  Anesthesia Type:MAC  Level of Consciousness: awake and alert   Airway & Oxygen Therapy: Patient Spontanous Breathing  Post-op Assessment: Report given to RN  Post vital signs: Reviewed and stable  Last Vitals:  Vitals Value Taken Time  BP    Temp 36.5 C 01/28/19 1444  Pulse 78 01/28/19 1444  Resp 16 01/28/19 1444  SpO2 99 % 01/28/19 1444    Last Pain:  Vitals:   01/28/19 1249  TempSrc:   PainSc: Asleep      Patients Stated Pain Goal: 0 (00/63/49 4944)  Complications: No apparent anesthesia complications

## 2019-01-28 NOTE — Progress Notes (Signed)
Report to [facility], 15 minute call to floor.

## 2019-01-28 NOTE — Interval H&P Note (Signed)
History and Physical Interval Note:  01/28/2019 2:14 PM  Karen [name]  has presented today for surgery, with the diagnosis of abdominal pain, nausea and vomiting.  The various methods of treatment have been discussed with the patient and family. After consideration of risks, benefits and other options for treatment, the patient has consented to  Procedure(s): ESOPHAGOGASTRODUODENOSCOPY (EGD) WITH PROPOFOL (N/A) as a surgical intervention.  The patient's history has been reviewed, patient examined, no change in status, stable for surgery.  I have reviewed the patient's chart and labs.  Questions were answered to the patient's satisfaction.     [place], [place]

## 2019-01-29 ENCOUNTER — Encounter: Payer: Self-pay | Admitting: *Deleted

## 2019-01-29 LAB — GLUCOSE, CAPILLARY
Glucose-Capillary: 188 mg/dL — ABNORMAL HIGH (ref 70–99)
Glucose-Capillary: 190 mg/dL — ABNORMAL HIGH (ref 70–99)

## 2019-01-29 LAB — CULTURE, BLOOD (ROUTINE X 2)
Culture: NO GROWTH
Culture: NO GROWTH
Special Requests: ADEQUATE
Special Requests: ADEQUATE

## 2019-01-29 LAB — COMPREHENSIVE METABOLIC PANEL
ALT: 32 U/L (ref 0–44)
AST: 19 U/L (ref 15–41)
Albumin: 2.9 g/dL — ABNORMAL LOW (ref 3.5–5.0)
Alkaline Phosphatase: 171 U/L — ABNORMAL HIGH (ref 38–126)
Anion gap: 6 (ref 5–15)
BUN: 18 mg/dL (ref 8–23)
CO2: 25 mmol/L (ref 22–32)
Calcium: 8.5 mg/dL — ABNORMAL LOW (ref 8.9–10.3)
Chloride: 107 mmol/L (ref 98–111)
Creatinine, Ser: 1.17 mg/dL — ABNORMAL HIGH (ref 0.44–1.00)
GFR calc Af Amer: 58 mL/min — ABNORMAL LOW (ref >60)
GFR calc non Af Amer: 50 mL/min — ABNORMAL LOW (ref >60)
Glucose, Bld: 227 mg/dL — ABNORMAL HIGH (ref 70–99)
Potassium: 3.8 mmol/L (ref 3.5–5.1)
Sodium: 138 mmol/L (ref 135–145)
Total Bilirubin: 0.4 mg/dL (ref 0.3–1.2)
Total Protein: 6.2 g/dL — ABNORMAL LOW (ref 6.5–8.1)

## 2019-01-29 LAB — LIPASE, BLOOD: Lipase: 46 U/L (ref 11–51)

## 2019-01-29 MED ORDER — NYSTATIN 100000 UNIT/GM EX POWD: Freq: Two times a day (BID) | CUTANEOUS | 0 refills | Status: DC

## 2019-01-29 MED ORDER — PANCRELIPASE (LIP-PROT-AMYL) 36000-114000 UNITS PO CPEP: 72000.0000 [IU] | ORAL_CAPSULE | Freq: Three times a day (TID) | ORAL | 0 refills | Status: DC

## 2019-01-29 MED ORDER — INSULIN ASPART 100 UNIT/ML ~~LOC~~ SOLN: 4.0000 [IU] | Freq: Three times a day (TID) | SUBCUTANEOUS | 11 refills | Status: DC

## 2019-01-29 MED ORDER — INSULIN PEN NEEDLE 29G X 12.7MM MISC: 1.0000 | Freq: Every day | 0 refills | Status: DC

## 2019-01-29 MED ORDER — INSULIN DETEMIR 100 UNIT/ML FLEXPEN: 6.0000 [IU] | Freq: Every day | SUBCUTANEOUS | 0 refills | Status: DC

## 2019-01-29 NOTE — Progress Notes (Signed)
Patient requested IV fluids to be stopped.

## 2019-01-29 NOTE — TOC Progression Note (Signed)
Transition of Care [facility]) - Progression Note    Patient Details  Name: NETHA DAFOE MRN: [id number] Date of Birth: Jan 24, 1955  [facility] of [facility]) CM/SW Contact  Truitt Merle, LCSW Phone Number: 01/29/2019, 10:52 AM  Clinical Narrative:    Patient ready to discharge home today with home health PT. Updated Tillie Rung at [facility] of patient's discharge today. Met with patient at bedside to update on discharge, review and answer questions about d/c plan. DME-RW in room. Patient will be transported home by boyfriend, per patient. Updated RN Claiborne Billings. No further TOC needs at this time. Consult closed.  Expected Discharge Plan: [place] Barriers to Discharge: Continued Medical Work up  Expected Discharge Plan and Services Expected Discharge Plan: [place] In-house Referral: Clinical Social Work Discharge Planning Services: NA Post Acute Care Choice: Home Health, Durable Medical Equipment Living arrangements for the past 2 months: Single Family Home Expected Discharge Date: 01/29/19               DME Arranged: Gilford Rile rolling DME Agency: [employer] Date DME Agency Contacted: 01/26/19 Time DME Agency Contacted: [id number] Representative spoke with at DME Agency: Leroy [place]: PT, OT [place] Agency: [facility] Date [place]: 01/26/19 Time [place]: [phone] Representative spoke with at [place]: [place]) Interventions    Readmission Risk Interventions No flowsheet data found.

## 2019-01-29 NOTE — Plan of Care (Signed)
Continuing with plan of care. 

## 2019-01-29 NOTE — Discharge Instructions (Signed)
Long acting insulin 6 units prescribed at night. You can take less of the short acting insulin with meals.  Chronic Pancreatitis  Chronic pancreatitis is long-lasting inflammation and scarring of the pancreas. The pancreas is a gland that is located behind the stomach. It makes enzymes that help to digest food. The pancreas also releases hormones called glucagon and insulin, which help regulate blood sugar (glucose). Damage to the pancreas may affect digestion, cause pain in the upper abdomen and back, and cause diabetes. Inflammation can also irritate other organs in the abdomen near the pancreas. At first, pancreatitis may be sudden (acute). If you have several or prolonged episodes of acute pancreatitis, the condition can turn into chronic pancreatitis. What are the causes? The most common cause of this condition is alcohol abuse. Other causes include:  High (elevated) levels of triglycerides in the blood (hypertriglyceridemia).  Gallstones or other conditions that can block the tube that drains the pancreas (pancreatic duct).  Pancreatic cancer.  Cystic fibrosis.  Too much calcium in the blood (hypercalcemia), which may be caused by an overactive parathyroid gland (hyperparathyroidism).  Certain medicines.  Injury to the pancreas.  Infection.  Autoimmune pancreatitis. This is when the body's disease-fighting (immune) system attacks the pancreas.  Genes that are passed from parent to child (inherited). In some cases, the cause may not be known. What increases the risk? This condition is more likely to develop in:  Men.  People who are 82-22 years old.  People who have a family history of pancreatitis.  People who smoke tobacco.  People who drink large amounts of alcohol over a long period of time. What are the signs or symptoms? Symptoms of this condition may include:  Pain in the abdomen or upper back. Pain may get worse after eating.  Nausea and  vomiting.  Fever.  Weight loss.  A change in the color and consistency of bowel movements, such as stools that are oily, fatty, or clay-colored. How is this diagnosed? This condition is diagnosed based on your symptoms, your medical history, and a physical exam. You may have tests, such as:  Blood tests.  Stool samples.  Biopsy of the pancreas. This is the removal of a small amount of pancreas tissue to be tested in a lab.  Imaging tests, such as: ? X-rays. ? CT scan. ? MRI. ? Ultrasound. How is this treated? You may need to be treated at a hospital. Treatment may involve:  Resting the pancreas. You may need to stop eating and drinking for a few days to give your pancreas time to recover. During this time, you will be given IV fluids to keep you hydrated.  Controlling pain. You may be given pain medicines by mouth (orally) or as injections.  Improving digestion. You may be given: ? Medicines to replace your pancreatic enzymes. ? Vitamin supplements. ? A specific diet to follow. You may work with a diet and nutrition specialist (dietitian) to make an eating plan.  Surgery to: ? Clear the pancreatic ducts of any blockages, such as gallstones. ? Remove any fluid or damaged tissue from the pancreas. Other treatments may include:  Preventing diabetes. Your health care provider may recommend that you: ? Get regular screening tests for diabetes. ? Monitor your blood glucose regularly.  Lifestyle changes, such as stopping alcohol use.  Steroid medicines, if your condition is caused by your immune system attacking your body's own tissues (autoimmune disease). Follow these instructions at home: Eating and drinking  Do not drink alcohol. If you need help quitting, ask your health care provider.  Follow a diet as told by your health care provider or dietitian, if this applies. This may include: ? Limiting how much fat you eat. ? Eating smaller meals more  often. ? Avoiding caffeine.  Drink enough fluid to keep your urine pale yellow. General instructions  Take over-the-counter and prescription medicines only as told by your health care provider. These include vitamin supplements.  Do not drive or use heavy machinery while taking prescription pain medicine.  If you are taking prescription pain medicine, take actions to prevent or treat constipation. Your health care provider may recommend that you: ? Take an over-the-counter or prescription medicine for constipation. ? Eat foods that are high in fiber such as whole grains and beans. ? Limit foods that are high in fat and processed sugars, such as fried or sweet foods.  Do not use any products that contain nicotine or tobacco, such as cigarettes and e-cigarettes. If you need help quitting, ask your health care provider.  If recommended by your health care provider, monitor your blood glucose at home.  Keep all follow-up visits as told by your health care provider. This is important. Contact a health care provider if:  You have pain that does not get better with medicine.  You have a fever.  You have sudden weight loss. Get help right away if:  Your pain suddenly gets worse.  You have sudden swelling in your abdomen.  You start to vomit often.  You vomit blood.  You have diarrhea that does not go away.  You have blood in your stool.  You become confused or you have trouble thinking clearly. Summary  Chronic pancreatitis is long-lasting inflammation and scarring of the pancreas. Damage to the pancreas may affect digestion, cause pain in the upper abdomen and back, and cause diabetes. Inflammation can also irritate other organs in the abdomen near the pancreas.  Common causes of this condition are alcohol abuse, gallstones, high (elevated) levels of triglycerides, and certain medicines.  This condition is sometimes treated at a hospital and may involve resting the pancreas,  controlling pain, replacing enzymes, and avoiding alcohol. This information is not intended to replace advice given to you by your health care provider. Make sure you discuss any questions you have with your health care provider. Document Revised: 07/28/2018 Document Reviewed: 08/21/2016 Elsevier Patient Education  [place].

## 2019-01-29 NOTE — Discharge Summary (Signed)
[place] [facility] [place] NAME: Karen [name]    MR#:  [id number]  DATE OF BIRTH:  12/31/1955  DATE OF ADMISSION:  01/24/2019 ADMITTING PHYSICIAN: Loletha Grayer, MD  DATE OF DISCHARGE: 01/29/2019  1:31 PM  PRIMARY CARE PHYSICIAN: Novella Rob, FNP    ADMISSION DIAGNOSIS:  Pancreatitis, recurrent [K85.90] Cystitis [N30.90] Nausea and vomiting, intractability of vomiting not specified, unspecified vomiting type [R11.2] Acute pancreatitis, unspecified complication status, unspecified pancreatitis type [K85.90] Pancreatitis [K85.90]  DISCHARGE DIAGNOSIS:  Acute on chronic pancreatitis Acute cystitis with group B streptococcus Left shoulder adhesive capsulitis Type 2 diabetes mellitus with hyperlipidemia Essential hypertension  SECONDARY DIAGNOSIS:   Past Medical History:  Diagnosis Date  . Alcoholic pancreatitis   . Asthma   . Depression   . Diabetes mellitus without complication ([place])   . DKA (diabetic ketoacidoses) ([place]) 02/17/2015  . Emphysematous cystitis   . Hypercholesteremia   . Hypertension   . Hypokalemia     HOSPITAL COURSE:   1.  Acute on chronic pancreatitis.  The patient was given IV fluid hydration during the entire hospital course and pain control.  The patient does have chronic pain and takes pain medications 4 times a day at home.  I increased her Creon to 72,000 units 3 times a day.  Since the patient took a long time to improve we actually got an endoscopy which was negative. 2.  Acute cystitis without hematuria.  Group B strep growing out of the urine culture.  The patient was on Rocephin during the hospital course and finished up a course of Rocephin here in the hospital. 3.  Left shoulder pain.  The patient has poor range of motion of her left shoulder and pain with movement.  MRI of the shoulder showed adhesive capsulitis.  Appreciate orthopedic consultation by Dr. Marry Guan and recommended home health physical and  occupational therapy. 4.  Type 2 diabetes mellitus with hyperlipidemia.  I started low-dose Levemir and patient also has short acting insulin at home.  Her last hemoglobin A1c was elevated at 11.4.  Sugars were variable during the hospital course depending on how much she ate.  Likely will have room to go up on her insulin as outpatient if she is eating better. 5.  Essential hypertension on amlodipine  DISCHARGE CONDITIONS:  Satisfactory  CONSULTS OBTAINED:  Treatment Team:  Dereck Leep, MD  DRUG ALLERGIES:   Allergies  Allergen Reactions  . Penicillins Anaphylaxis    Has patient had a PCN reaction causing immediate rash, facial/tongue/throat swelling, SOB or lightheadedness with hypotension: Yes Has patient had a PCN reaction causing severe rash involving mucus membranes or skin necrosis: No Has patient had a PCN reaction that required hospitalization No Has patient had a PCN reaction occurring within the last 10 years: No If all of the above answers are "NO", then may proceed with Cephalosporin use.  . Reglan [Metoclopramide] Other (See Comments)    Hypotension, shortness of breath  . Percocet [Oxycodone-Acetaminophen] Rash    DISCHARGE MEDICATIONS:   Allergies as of 01/29/2019      Reactions   Penicillins Anaphylaxis   Has patient had a PCN reaction causing immediate rash, facial/tongue/throat swelling, SOB or lightheadedness with hypotension: Yes Has patient had a PCN reaction causing severe rash involving mucus membranes or skin necrosis: No Has patient had a PCN reaction that required hospitalization No Has patient had a PCN reaction occurring within the last 10 years: No If all of the  above answers are "NO", then may proceed with Cephalosporin use.   Reglan [metoclopramide] Other (See Comments)   Hypotension, shortness of breath   Percocet [oxycodone-acetaminophen] Rash      Medication List    STOP taking these medications   Farxiga 10 MG Tabs tablet Generic  drug: dapagliflozin propanediol     TAKE these medications   amLODipine 10 MG tablet Commonly known as: NORVASC Take 10 mg by mouth daily.   cyclobenzaprine 5 MG tablet Commonly known as: FLEXERIL Take 1 tablet (5 mg total) by mouth 2 (two) times daily. What changed:   when to take this  reasons to take this   HYDROcodone-acetaminophen 10-325 MG tablet Commonly known as: NORCO Take 1 tablet by mouth 4 (four) times daily.   insulin aspart 100 UNIT/ML injection Commonly known as: novoLOG Inject 4 Units into the skin 3 (three) times daily with meals. What changed:   how much to take  when to take this   insulin detemir 100 unit/ml Soln Commonly known as: LEVEMIR Inject 0.06 mLs (6 Units total) into the skin at bedtime.   Insulin Pen Needle 29G X 12.7MM Misc 1 Device by Does not apply route at bedtime.   lipase/protease/amylase 36000 UNITS Cpep capsule Commonly known as: Creon Take 2 capsules (72,000 Units total) by mouth 3 (three) times daily with meals. What changed:   how much to take  when to take this  additional instructions   nystatin powder Commonly known as: MYCOSTATIN/NYSTOP Apply topically 2 (two) times daily.   omeprazole 40 MG capsule Commonly known as: PRILOSEC Take 1 capsule (40 mg total) by mouth 2 (two) times daily for 14 days.   pregabalin 150 MG capsule Commonly known as: LYRICA Take 150 mg by mouth 2 (two) times daily.   rosuvastatin 20 MG tablet Commonly known as: CRESTOR Take 20 mg by mouth daily.        DISCHARGE INSTRUCTIONS:   Follow-up with PMD tomorrow as per patient Home health set up  If you experience worsening of your admission symptoms, develop shortness of breath, life threatening emergency, suicidal or homicidal thoughts you must seek medical attention immediately by calling 911 or calling your MD immediately  if symptoms less severe.  You Must read complete instructions/literature along with all the possible  adverse reactions/side effects for all the Medicines you take and that have been prescribed to you. Take any new Medicines after you have completely understood and accept all the possible adverse reactions/side effects.   Please note  You were cared for by a hospitalist during your hospital stay. If you have any questions about your discharge medications or the care you received while you were in the hospital after you are discharged, you can call the unit and asked to speak with the hospitalist on call if the hospitalist that took care of you is not available. Once you are discharged, your primary care physician will handle any further medical issues. Please note that NO REFILLS for any discharge medications will be authorized once you are discharged, as it is imperative that you return to your primary care physician (or establish a relationship with a primary care physician if you do not have one) for your aftercare needs so that they can reassess your need for medications and monitor your lab values.    Today   CHIEF COMPLAINT:   Chief Complaint  Patient presents with  . Fever    HISTORY OF PRESENT ILLNESS:  Karen [name]  is a  64 y.o. female came in not feeling well with abdominal pain fever and shoulder pain   VITAL SIGNS:  Blood pressure (!) 150/78, pulse 82, temperature 97.6 F (36.4 C), temperature source Oral, resp. rate 20, height 5\' 1"  (1.549 m), weight 52.2 kg, SpO2 98 %.  I/O:    Intake/Output Summary (Last 24 hours) at 01/29/2019 1408 Last data filed at 01/29/2019 1324 Gross per 24 hour  Intake 663.74 ml  Output 550 ml  Net 113.74 ml    PHYSICAL EXAMINATION:  GENERAL:  64 y.o.-year-old patient lying in the bed with no acute distress.  EYES: Pupils equal, round, reactive to light and accommodation. No scleral icterus. Extraocular muscles intact.  HEENT: Head atraumatic, normocephalic. Oropharynx and nasopharynx clear.  NECK:  Supple, no jugular venous distention. No  thyroid enlargement, no tenderness.  LUNGS: Normal breath sounds bilaterally, no wheezing, rales,rhonchi or crepitation. No use of accessory muscles of respiration.  CARDIOVASCULAR: S1, S2 normal. No murmurs, rubs, or gallops.  ABDOMEN: Soft, slight epigastric tenderness, non-distended. Bowel sounds present. No organomegaly or mass.  EXTREMITIES: No pedal edema.  Limited and painful range of motion left shoulder NEUROLOGIC: Cranial nerves II through XII are intact. PSYCHIATRIC: The patient is alert and oriented x 3.  SKIN: Erythematous rash on the upper  DATA REVIEW:   CBC Recent Labs  Lab 01/27/19 0500 01/27/19 0500 01/28/19 0647  WBC 6.3  --   --   HGB 10.9*  11.0*   < > 10.5*  HCT 34.5*  --   --   PLT 182  --   --    < > = values in this interval not displayed.    Chemistries  Recent Labs  Lab 01/29/19 0359  NA 138  K 3.8  CL 107  CO2 25  GLUCOSE 227*  BUN 18  CREATININE 1.17*  CALCIUM 8.5*  AST 19  ALT 32  ALKPHOS 171*  BILITOT 0.4     Microbiology Results  Results for orders placed or performed during the hospital encounter of 01/24/19  Respiratory Panel by RT PCR (Flu A&B, Covid) - Nasopharyngeal Swab     Status: None   Collection Time: 01/24/19 10:33 AM   Specimen: Nasopharyngeal Swab  Result Value Ref Range Status   SARS Coronavirus 2 by RT PCR NEGATIVE NEGATIVE Final    Comment: (NOTE) SARS-CoV-2 target nucleic acids are NOT DETECTED. The SARS-CoV-2 RNA is generally detectable in upper respiratoy specimens during the acute phase of infection. The lowest concentration of SARS-CoV-2 viral copies this assay can detect is 131 copies/mL. A negative result does not preclude SARS-Cov-2 infection and should not be used as the sole basis for treatment or other patient management decisions. A negative result may occur with  improper specimen collection/handling, submission of specimen other than nasopharyngeal swab, presence of viral mutation(s) within  the areas targeted by this assay, and inadequate number of viral copies (<131 copies/mL). A negative result must be combined with clinical observations, patient history, and epidemiological information. The expected result is Negative. Fact Sheet for Patients:  PinkCheek.be Fact Sheet for Healthcare Providers:  GravelBags.it This test is not yet ap proved or cleared by the [place] FDA and  has been authorized for detection and/or diagnosis of SARS-CoV-2 by FDA under an Emergency Use Authorization (EUA). This EUA will remain  in effect (meaning this test can be used) for the duration of the COVID-19 declaration under Section 564(b)(1) of the Act, 21 U.S.C. section 360bbb-3(b)(1), unless the authorization is  terminated or revoked sooner.    Influenza A by PCR NEGATIVE NEGATIVE Final   Influenza B by PCR NEGATIVE NEGATIVE Final    Comment: (NOTE) The Xpert Xpress SARS-CoV-2/FLU/RSV assay is intended as an aid in  the diagnosis of influenza from Nasopharyngeal swab specimens and  should not be used as a sole basis for treatment. Nasal washings and  aspirates are unacceptable for Xpert Xpress SARS-CoV-2/FLU/RSV  testing. Fact Sheet for Patients: PinkCheek.be Fact Sheet for Healthcare Providers: GravelBags.it This test is not yet approved or cleared by the [place] FDA and  has been authorized for detection and/or diagnosis of SARS-CoV-2 by  FDA under an Emergency Use Authorization (EUA). This EUA will remain  in effect (meaning this test can be used) for the duration of the  Covid-19 declaration under Section 564(b)(1) of the Act, 21  U.S.C. section 360bbb-3(b)(1), unless the authorization is  terminated or revoked. Performed at [facility], [address] [place], [place] [address]   Urine culture     Status: Abnormal   Collection Time: 01/24/19  10:33 AM   Specimen: Urine, Random  Result Value Ref Range Status   Specimen Description   Final    URINE, RANDOM Performed at [facility], [address], [place], [place] [address]    Special Requests   Final    NONE Performed at [facility], [address] [place], [place] [address]    Culture (A)  Final    80,000 COLONIES/mL GROUP B STREP(S.AGALACTIAE)ISOLATED TESTING AGAINST S. AGALACTIAE NOT ROUTINELY PERFORMED DUE TO PREDICTABILITY OF AMP/PEN/VAN SUSCEPTIBILITY. Performed at [facility], [place] [address], [place], [place] [address]    Report Status 01/25/2019 FINAL  Final  CULTURE, BLOOD (ROUTINE X 2) w Reflex to ID Panel     Status: None   Collection Time: 01/24/19  5:01 PM   Specimen: BLOOD  Result Value Ref Range Status   Specimen Description BLOOD LEFT ANTECUBITAL  Final   Special Requests   Final    BOTTLES DRAWN AEROBIC AND ANAEROBIC Blood Culture adequate volume   Culture   Final    NO GROWTH 5 DAYS Performed at [facility], [address] [place], [place] [address]    Report Status 01/29/2019 FINAL  Final  CULTURE, BLOOD (ROUTINE X 2) w Reflex to ID Panel     Status: None   Collection Time: 01/24/19  5:01 PM   Specimen: BLOOD  Result Value Ref Range Status   Specimen Description BLOOD BLOOD RIGHT WRIST  Final   Special Requests   Final    BOTTLES DRAWN AEROBIC AND ANAEROBIC Blood Culture adequate volume   Culture   Final    NO GROWTH 5 DAYS Performed at [facility], [address], [place], [place] [address]    Report Status 01/29/2019 FINAL  Final      Management plans discussed with the patient, and she is in agreement.  CODE STATUS:     Code Status Orders  (From admission, onward)         Start     Ordered   01/24/19 1613  Full code  Continuous     01/24/19 1612        Code Status History    Date Active Date Inactive Code Status Order ID Comments User Context   09/25/2018 [id number] 09/29/2018  2252 Full Code [id number]  Demetrios Loll, MD ED   11/11/2017 1417 11/13/2017 1948 Full Code [id number]  [place],  Srikar, MD ED   04/25/2017 1039 04/26/2017 1415 Full Code [id number]  Schuyler Amor, MD ED   01/03/2017 1730 01/07/2017 1531 Full Code [id number]  Idelle Crouch, MD Inpatient   12/10/2016 1201 12/12/2016 1712 Full Code [id number]  Demetrios Loll, MD Inpatient   10/15/2016 2350 10/17/2016 0056 Full Code [id number]  Dustin Flock, MD Inpatient   04/21/2016 0505 04/24/2016 2031 Full Code [id number]  Saundra Shelling, MD Inpatient   12/31/2015 0739 01/01/2016 2008 Full Code [id number]  Saundra Shelling, MD Inpatient   12/10/2015 1109 12/12/2015 1848 Full Code [id number]  Demetrios Loll, MD Inpatient   10/29/2015 1545 11/01/2015 1623 Full Code [id number]  Theodoro Grist, MD Inpatient   02/17/2015 1704 02/19/2015 1804 Full Code [id number]  Nicholes Mango, MD Inpatient   Advance Care Planning Activity      TOTAL TIME TAKING CARE OF THIS PATIENT: 35 minutes.    Loletha Grayer M.D on 01/29/2019 at 2:08 PM  Between 7am to 6pm - Pager - [phone]  After 6pm go to [email or web address] - password [email or web address]  [facility]  CC: Primary care physician; Novella Rob, FNP

## 2019-01-29 NOTE — Plan of Care (Signed)
Discharge teaching completed with patient who verbalized understanding of teaching, medications, and follow up.  Patient belongings are gathered and patient is in stable condition.  Patient ride to arrive at 12:30pm to take home.

## 2019-01-30 ENCOUNTER — Other Ambulatory Visit: Payer: Self-pay

## 2019-01-30 ENCOUNTER — Observation Stay
Admission: EM | Admit: 2019-01-30 | Discharge: 2019-01-31 | Disposition: A | Discharge status: Home / Self Care | Payer: Medicaid Other | Attending: Internal Medicine | Admitting: Internal Medicine

## 2019-01-30 ENCOUNTER — Emergency Department: Payer: Medicaid Other

## 2019-01-30 DIAGNOSIS — E1165 Type 2 diabetes mellitus with hyperglycemia: Secondary | ICD-10-CM | POA: Insufficient documentation

## 2019-01-30 DIAGNOSIS — Z794 Long term (current) use of insulin: Secondary | ICD-10-CM | POA: Diagnosis not present

## 2019-01-30 DIAGNOSIS — Z79899 Other long term (current) drug therapy: Secondary | ICD-10-CM | POA: Diagnosis not present

## 2019-01-30 DIAGNOSIS — Z87891 Personal history of nicotine dependence: Secondary | ICD-10-CM | POA: Diagnosis not present

## 2019-01-30 DIAGNOSIS — Z880 Allergy status to penicillin: Secondary | ICD-10-CM | POA: Diagnosis not present

## 2019-01-30 DIAGNOSIS — E1122 Type 2 diabetes mellitus with diabetic chronic kidney disease: Secondary | ICD-10-CM | POA: Diagnosis not present

## 2019-01-30 DIAGNOSIS — N1831 Chronic kidney disease, stage 3a: Secondary | ICD-10-CM | POA: Diagnosis not present

## 2019-01-30 DIAGNOSIS — K861 Other chronic pancreatitis: Secondary | ICD-10-CM

## 2019-01-30 DIAGNOSIS — Z888 Allergy status to other drugs, medicaments and biological substances status: Secondary | ICD-10-CM | POA: Diagnosis not present

## 2019-01-30 DIAGNOSIS — Z8719 Personal history of other diseases of the digestive system: Secondary | ICD-10-CM | POA: Diagnosis not present

## 2019-01-30 DIAGNOSIS — K859 Acute pancreatitis without necrosis or infection, unspecified: Secondary | ICD-10-CM | POA: Diagnosis present

## 2019-01-30 DIAGNOSIS — E7800 Pure hypercholesterolemia, unspecified: Secondary | ICD-10-CM | POA: Insufficient documentation

## 2019-01-30 DIAGNOSIS — Z885 Allergy status to narcotic agent status: Secondary | ICD-10-CM | POA: Insufficient documentation

## 2019-01-30 DIAGNOSIS — K219 Gastro-esophageal reflux disease without esophagitis: Secondary | ICD-10-CM | POA: Diagnosis not present

## 2019-01-30 DIAGNOSIS — K5900 Constipation, unspecified: Secondary | ICD-10-CM | POA: Insufficient documentation

## 2019-01-30 DIAGNOSIS — R1013 Epigastric pain: Secondary | ICD-10-CM | POA: Diagnosis present

## 2019-01-30 DIAGNOSIS — J45909 Unspecified asthma, uncomplicated: Secondary | ICD-10-CM | POA: Insufficient documentation

## 2019-01-30 DIAGNOSIS — N179 Acute kidney failure, unspecified: Principal | ICD-10-CM | POA: Diagnosis present

## 2019-01-30 DIAGNOSIS — Z20822 Contact with and (suspected) exposure to covid-19: Secondary | ICD-10-CM | POA: Insufficient documentation

## 2019-01-30 DIAGNOSIS — I129 Hypertensive chronic kidney disease with stage 1 through stage 4 chronic kidney disease, or unspecified chronic kidney disease: Secondary | ICD-10-CM | POA: Diagnosis not present

## 2019-01-30 LAB — ETHANOL: Alcohol, Ethyl (B): <10 mg/dL (ref <10)

## 2019-01-30 LAB — CBC
HCT: 32.5 % — ABNORMAL LOW (ref 36.0–46.0)
Hemoglobin: 10.7 g/dL — ABNORMAL LOW (ref 12.0–15.0)
MCH: 27.9 pg (ref 26.0–34.0)
MCHC: 32.9 g/dL (ref 30.0–36.0)
MCV: 84.6 fL (ref 80.0–100.0)
Platelets: 189 10*3/uL (ref 150–400)
RBC: 3.84 MIL/uL — ABNORMAL LOW (ref 3.87–5.11)
RDW: 13.2 % (ref 11.5–15.5)
WBC: 10.7 10*3/uL — ABNORMAL HIGH (ref 4.0–10.5)
nRBC: 0 % (ref 0.0–0.2)

## 2019-01-30 LAB — HEMOGLOBIN A1C
Hgb A1c MFr Bld: 11 % — ABNORMAL HIGH (ref 4.8–5.6)
Mean Plasma Glucose: 269 mg/dL

## 2019-01-30 LAB — CBC WITH DIFFERENTIAL/PLATELET
Abs Immature Granulocytes: 0.05 10*3/uL (ref 0.00–0.07)
Basophils Absolute: 0 10*3/uL (ref 0.0–0.1)
Basophils Relative: 1 %
Eosinophils Absolute: 0.3 10*3/uL (ref 0.0–0.5)
Eosinophils Relative: 4 %
HCT: 37.1 % (ref 36.0–46.0)
Hemoglobin: 12.2 g/dL (ref 12.0–15.0)
Immature Granulocytes: 1 %
Lymphocytes Relative: 25 %
Lymphs Abs: 1.9 10*3/uL (ref 0.7–4.0)
MCH: 27.7 pg (ref 26.0–34.0)
MCHC: 32.9 g/dL (ref 30.0–36.0)
MCV: 84.3 fL (ref 80.0–100.0)
Monocytes Absolute: 0.7 10*3/uL (ref 0.1–1.0)
Monocytes Relative: 9 %
Neutro Abs: 4.7 10*3/uL (ref 1.7–7.7)
Neutrophils Relative %: 60 %
Platelets: 204 10*3/uL (ref 150–400)
RBC: 4.4 MIL/uL (ref 3.87–5.11)
RDW: 13 % (ref 11.5–15.5)
WBC: 7.7 10*3/uL (ref 4.0–10.5)
nRBC: 0 % (ref 0.0–0.2)

## 2019-01-30 LAB — COMPREHENSIVE METABOLIC PANEL
ALT: 35 U/L (ref 0–44)
AST: 25 U/L (ref 15–41)
Albumin: 3.7 g/dL (ref 3.5–5.0)
Alkaline Phosphatase: 208 U/L — ABNORMAL HIGH (ref 38–126)
Anion gap: 17 — ABNORMAL HIGH (ref 5–15)
BUN: 26 mg/dL — ABNORMAL HIGH (ref 8–23)
CO2: 21 mmol/L — ABNORMAL LOW (ref 22–32)
Calcium: 9.2 mg/dL (ref 8.9–10.3)
Chloride: 104 mmol/L (ref 98–111)
Creatinine, Ser: 1.63 mg/dL — ABNORMAL HIGH (ref 0.44–1.00)
GFR calc Af Amer: 39 mL/min — ABNORMAL LOW (ref >60)
GFR calc non Af Amer: 34 mL/min — ABNORMAL LOW (ref >60)
Glucose, Bld: 328 mg/dL — ABNORMAL HIGH (ref 70–99)
Potassium: 4.1 mmol/L (ref 3.5–5.1)
Sodium: 142 mmol/L (ref 135–145)
Total Bilirubin: 0.7 mg/dL (ref 0.3–1.2)
Total Protein: 8 g/dL (ref 6.5–8.1)

## 2019-01-30 LAB — CREATININE, SERUM
Creatinine, Ser: 1.39 mg/dL — ABNORMAL HIGH (ref 0.44–1.00)
GFR calc Af Amer: 47 mL/min — ABNORMAL LOW (ref >60)
GFR calc non Af Amer: 41 mL/min — ABNORMAL LOW (ref >60)

## 2019-01-30 LAB — GLUCOSE, CAPILLARY
Glucose-Capillary: 286 mg/dL — ABNORMAL HIGH (ref 70–99)
Glucose-Capillary: 267 mg/dL — ABNORMAL HIGH (ref 70–99)
Glucose-Capillary: 158 mg/dL — ABNORMAL HIGH (ref 70–99)
Glucose-Capillary: 90 mg/dL (ref 70–99)
Glucose-Capillary: 356 mg/dL — ABNORMAL HIGH (ref 70–99)
Glucose-Capillary: 60 mg/dL — ABNORMAL LOW (ref 70–99)
Glucose-Capillary: 103 mg/dL — ABNORMAL HIGH (ref 70–99)

## 2019-01-30 LAB — SARS CORONAVIRUS 2 (TAT 6-24 HRS): SARS Coronavirus 2: NEGATIVE

## 2019-01-30 LAB — TROPONIN I (HIGH SENSITIVITY)
Troponin I (High Sensitivity): 4 ng/L (ref <18)
Troponin I (High Sensitivity): 4 ng/L (ref <18)

## 2019-01-30 LAB — LIPASE, BLOOD: Lipase: 63 U/L — ABNORMAL HIGH (ref 11–51)

## 2019-01-30 MED ORDER — HYDROMORPHONE HCL 1 MG/ML IJ SOLN
0.5000 mg | Freq: Once | INTRAMUSCULAR | Status: AC
  Administered 2019-01-30: 0.5 mg via INTRAVENOUS
  Filled 2019-01-30: qty 1

## 2019-01-30 MED ORDER — HYDROCODONE-ACETAMINOPHEN 10-325 MG PO TABS
1.0000 | ORAL_TABLET | ORAL | Status: DC | PRN
  Administered 2019-01-30 – 2019-01-31 (×4): 1 via ORAL
  Filled 2019-01-30 (×4): qty 1

## 2019-01-30 MED ORDER — ONDANSETRON HCL 4 MG PO TABS: 4.0000 mg | ORAL_TABLET | Freq: Four times a day (QID) | ORAL | Status: DC | PRN

## 2019-01-30 MED ORDER — SODIUM CHLORIDE 0.9 % IV SOLN: INTRAVENOUS | Status: DC

## 2019-01-30 MED ORDER — ONDANSETRON HCL 4 MG/2ML IJ SOLN: 4.0000 mg | Freq: Four times a day (QID) | INTRAMUSCULAR | Status: DC | PRN

## 2019-01-30 MED ORDER — ENOXAPARIN SODIUM 40 MG/0.4ML ~~LOC~~ SOLN
40.0000 mg | SUBCUTANEOUS | Status: DC
  Administered 2019-01-30 – 2019-01-31 (×2): 40 mg via SUBCUTANEOUS
  Filled 2019-01-30 (×2): qty 0.4

## 2019-01-30 MED ORDER — SODIUM CHLORIDE 0.9 % IV BOLUS
1000.0000 mL | Freq: Once | INTRAVENOUS | Status: AC
  Administered 2019-01-30: 1000 mL via INTRAVENOUS

## 2019-01-30 MED ORDER — MORPHINE SULFATE (PF) 2 MG/ML IV SOLN
1.0000 mg | INTRAVENOUS | Status: DC | PRN
  Administered 2019-01-30 (×3): 1 mg via INTRAVENOUS
  Filled 2019-01-30 (×4): qty 1

## 2019-01-30 MED ORDER — INSULIN ASPART 100 UNIT/ML ~~LOC~~ SOLN
2.0000 [IU] | Freq: Three times a day (TID) | SUBCUTANEOUS | Status: DC
  Administered 2019-01-31: 2 [IU] via SUBCUTANEOUS
  Filled 2019-01-30: qty 1

## 2019-01-30 MED ORDER — INSULIN ASPART 100 UNIT/ML ~~LOC~~ SOLN
0.0000 [IU] | SUBCUTANEOUS | Status: DC
  Administered 2019-01-30: 15 [IU] via SUBCUTANEOUS
  Administered 2019-01-30: 3 [IU] via SUBCUTANEOUS
  Administered 2019-01-31: 8 [IU] via SUBCUTANEOUS
  Administered 2019-01-31: 3 [IU] via SUBCUTANEOUS
  Filled 2019-01-30 (×4): qty 1

## 2019-01-30 MED ORDER — ONDANSETRON HCL 4 MG/2ML IJ SOLN
4.0000 mg | Freq: Once | INTRAMUSCULAR | Status: AC
  Administered 2019-01-30: 4 mg via INTRAVENOUS
  Filled 2019-01-30: qty 2

## 2019-01-30 MED ORDER — INSULIN ASPART 100 UNIT/ML ~~LOC~~ SOLN
8.0000 [IU] | Freq: Once | SUBCUTANEOUS | Status: AC
  Administered 2019-01-30: 8 [IU] via SUBCUTANEOUS
  Filled 2019-01-30: qty 1

## 2019-01-30 MED ORDER — INSULIN ASPART 100 UNIT/ML ~~LOC~~ SOLN: 10.0000 [IU] | Freq: Once | SUBCUTANEOUS | Status: DC

## 2019-01-30 NOTE — ED Triage Notes (Addendum)
Pt arrives [facility] from home w cc of abd pain. Pt reports she had a flareup of pancreatitis earlier this week 10/10 pain. Pt states she was here last Tuesday and wasn't able to pick up meds.  BS 359

## 2019-01-30 NOTE — Progress Notes (Signed)
Inpatient Diabetes Program Recommendations  AACE/ADA: New Consensus Statement on Inpatient Glycemic Control (2015)  Target Ranges:  Prepandial:   less than 140 mg/dL      Peak postprandial:   less than 180 mg/dL (1-2 hours)      Critically ill patients:  140 - 180 mg/dL   Lab Results  Component Value Date   GLUCAP 90 01/30/2019   HGBA1C 11.0 (H) 01/30/2019    Review of Glycemic Control  Diabetes history: DM2 Outpatient Diabetes medications: Novolog  70/30 15 units BID Current orders for Inpatient glycemic control: Novolog 0-15 Q4H  Inpatient Diabetes Program Recommendations:     -Novolog 0-9 TID with meals + 0-5 QHS   Note: Spoke with patient at bedside.  Reviewed patient's current A1c of 11%. Explained what a A1c is and what it measures. Also reviewed goal A1c with patient, importance of good glucose control @ home, and blood sugar goals.  Patient was discharged yesterday around noon.  She had a sandwich and a small amount of grapefruit juice then went to bed.  Woke up around 11pm with abdominal pain.    She sees Dr. Humphrey Rolls for her diabetes.  She had an appointment today.   Denies taking Levemir as listed in home meds.  She takes Novolog  70/30 15 units BID.  She checks her blood sugar twice a day.  States she only drinks diet drinks and has not had ETOH in 3 years.    Has occasional low blood sugars and she becomes symptomatic and eats something with sugar.  Review the Plate Method and encouraged her to limit her CHO intake.  Follow up with PCP ASAP.  Thank you, Reche Dixon, RN, BSN Diabetes Coordinator Inpatient Diabetes Program [phone] (team pager from 8a-5p)

## 2019-01-30 NOTE — Progress Notes (Signed)
Patient has been resting throughout the day with chief complaint of on-going upper ABD non-radiating ABD pain. Has been receiving PRN pain medication to keep pain at a tolerable level. Performed self-care with short ambulation today and has been on a clear liquid diet that is well tolerated. Hemodynamically stable at this time and will continue to monitor for changes.

## 2019-01-30 NOTE — Progress Notes (Signed)
Patient ID: Karen [name], female   DOB: 11/09/1955, 64 y.o.   MRN: [id number] Patient was admitted this morning does does not need a progress note from me. H&P reviewed and in summary patient is a 64 year old female with history of hypertension, dyslipidemia, diabetes,asthma, GERD, depression, anxiety, DKA, alcoholic pancreatitis, CKD-3, alcohol abuse in remission, hospitalized from 01/24/2019 to 01/29/2019 with acute on chronic pancreatitis who returns to the emergency room with nausea vomiting, and abdominal pain.  On arrival she was tachycardic and her creatinine had elevated from baseline to 1.63.  Blood sugars were also elevated with increased anion gap.  She was started on IV fluids.  N.p.o.  A.m. I saw patient and she is complaining of mild epigastric pain but asking for food.  We will start her on clear diet if she tolerates.  DC IV morphine and start her home hydrocodone dose.  Check a.m. labs.  If stable can discharge in a.m.

## 2019-01-30 NOTE — H&P (Signed)
History and Physical    LAURABETH YIP [id number] DOB: 1955-11-17 DOA: 01/30/2019  PCP: Novella Rob, FNP   Patient coming from: home  I have personally briefly reviewed patient's old medical records in [place]  Chief Complaint: Vomiting and abdominal pain  HPI: Karen [name] is a 64 y.o. female with medical history significant for hypertension, hyperlipidemia, diabetes mellitus, asthma, GERD, depression, anxiety, DKA, alcoholic pancreatitis, CKD-3, alcohol abuse in remission, hospitalized from 01/24/2019 to 01/29/2019 with acute on chronic pancreatitis who returns to the emergency room with nausea vomiting, and abdominal pain.  She states she was feeling fine when she left the hospital and when she got home had a [place] sandwich and some grapefruit juice and was doing well until tonight when she awoke with abdominal pain and vomited twice.  Her pain is in the upper abdomen, constant, nonradiating and of moderate intensity.  She denies fever, chills, chest pain or shortness of breath ED Course: On arrival in the emergency room she was afebrile at 98.7 with blood pressure 154/90.  She was tachycardic at 106, O2 sat 94% on room air.  White cell count was normal.  Lipase added at 63, up from 43 when discharged earlier.  Liver enzymes were within normal limits though alk phos slightly elevated at 208.  Creatinine was 1.63 up from 1.17 at discharge.  Blood sugars elevated at 328 with a slightly increased gap of 17.  Patient had an abdominal x-ray that showed moderate stool burden and no evidence of bowel obstruction.  She was given IV Dilaudid and Zofran as well as subcu insulin.  Hospitalist consulted for admission Review of Systems: As per HPI otherwise 10 point review of systems negative.    Past Medical History:  Diagnosis Date  . Alcoholic pancreatitis   . Asthma   . Depression   . Diabetes mellitus without complication ([place])   . DKA (diabetic ketoacidoses) ([place]) 02/17/2015  .  Emphysematous cystitis   . Hypercholesteremia   . Hypertension   . Hypokalemia     Past Surgical History:  Procedure Laterality Date  . ABDOMINAL HYSTERECTOMY  1996  . APPENDECTOMY  1997  . ESOPHAGOGASTRODUODENOSCOPY N/A 11/12/2017   Procedure: ESOPHAGOGASTRODUODENOSCOPY (EGD);  Surgeon: Lin Landsman, MD;  Location: [facility] ENDOSCOPY;  Service: Gastroenterology;  Laterality: N/A;  . ESOPHAGOGASTRODUODENOSCOPY N/A 09/29/2018   Procedure: ESOPHAGOGASTRODUODENOSCOPY (EGD);  Surgeon: Toledo, Benay Pike, MD;  Location: [facility] ENDOSCOPY;  Service: Gastroenterology;  Laterality: N/A;  . ESOPHAGOGASTRODUODENOSCOPY (EGD) WITH PROPOFOL N/A 04/24/2016   Procedure: ESOPHAGOGASTRODUODENOSCOPY (EGD) WITH PROPOFOL;  Surgeon: [name], MD;  Location: [facility] ENDOSCOPY;  Service: Endoscopy;  Laterality: N/A;  . ESOPHAGOGASTRODUODENOSCOPY (EGD) WITH PROPOFOL N/A 01/28/2019   Procedure: ESOPHAGOGASTRODUODENOSCOPY (EGD) WITH PROPOFOL;  Surgeon: Toledo, Benay Pike, MD;  Location: [facility] ENDOSCOPY;  Service: Gastroenterology;  Laterality: N/A;  . HAND SURGERY  1988  . THYROID SURGERY  2013     reports that she has quit smoking. She has never used smokeless tobacco. She reports previous alcohol use. She reports that she does not use drugs.  Allergies  Allergen Reactions  . Penicillins Anaphylaxis    Has patient had a PCN reaction causing immediate rash, facial/tongue/throat swelling, SOB or lightheadedness with hypotension: Yes Has patient had a PCN reaction causing severe rash involving mucus membranes or skin necrosis: No Has patient had a PCN reaction that required hospitalization No Has patient had a PCN reaction occurring within the last 10 years: No If all of the above answers are "  NO", then may proceed with Cephalosporin use.  . Reglan [Metoclopramide] Other (See Comments)    Hypotension, shortness of breath  . Percocet [Oxycodone-Acetaminophen] Rash    Family History  Family history unknown: Yes      Prior to Admission medications   Medication Sig Start Date End Date Taking? Authorizing Provider  amLODipine (NORVASC) 10 MG tablet Take 10 mg by mouth daily.    [provider]  cyclobenzaprine (FLEXERIL) 5 MG tablet Take 1 tablet (5 mg total) by mouth 2 (two) times daily. Patient taking differently: Take 5 mg by mouth 2 (two) times daily as needed for muscle spasms.  01/07/17   Dustin Flock, MD  HYDROcodone-acetaminophen (NORCO) 10-325 MG tablet Take 1 tablet by mouth 4 (four) times daily.     [provider]  insulin aspart (NOVOLOG) 100 UNIT/ML injection Inject 4 Units into the skin 3 (three) times daily with meals. 01/29/19   Wieting, Richard, MD  insulin detemir (LEVEMIR) 100 unit/ml SOLN Inject 0.06 mLs (6 Units total) into the skin at bedtime. 01/29/19   Loletha Grayer, MD  Insulin Pen Needle 29G X 12.7MM MISC 1 Device by Does not apply route at bedtime. 01/29/19   Loletha Grayer, MD  lipase/protease/amylase (CREON) 36000 UNITS CPEP capsule Take 2 capsules (72,000 Units total) by mouth 3 (three) times daily with meals. 01/29/19   Loletha Grayer, MD  nystatin (MYCOSTATIN/NYSTOP) powder Apply topically 2 (two) times daily. 01/29/19   Loletha Grayer, MD  omeprazole (PRILOSEC) 40 MG capsule Take 1 capsule (40 mg total) by mouth 2 (two) times daily for 14 days. 09/22/18 01/24/19  Merlyn Lot, MD  pregabalin (LYRICA) 150 MG capsule Take 150 mg by mouth 2 (two) times daily.    [provider]  rosuvastatin (CRESTOR) 20 MG tablet Take 20 mg by mouth daily.    [provider]  GLUCERNA (GLUCERNA) LIQD Take 237 mLs by mouth 3 (three) times daily between meals. 02/19/15 02/27/15  Bettey Costa, MD    Physical Exam: Vitals:   01/30/19 0017 01/30/19 0019  BP:  (!) 159/80  Pulse:  91  Resp:  17  Temp:  98.3 F (36.8 C)  TempSrc:  Oral  SpO2:  100%  Weight: 52 kg   Height: 5' 1"  (1.549 m)      Vitals:   01/30/19 0017 01/30/19 0019  BP:  (!)  159/80  Pulse:  91  Resp:  17  Temp:  98.3 F (36.8 C)  TempSrc:  Oral  SpO2:  100%  Weight: 52 kg   Height: 5' 1"  (1.549 m)     Constitutional: NAD, alert and oriented x 3 Eyes: PERRL, lids and conjunctivae normal ENMT: Mucous membranes are moist.  Neck: normal, supple, no masses, no thyromegaly Respiratory: clear to auscultation bilaterally, no wheezing, no crackles. Normal respiratory effort. No accessory muscle use.  Cardiovascular: Regular rate and rhythm, no murmurs / rubs / gallops. No extremity edema. 2+ pedal pulses. No carotid bruits.  Abdomen: Tender epigastrium, no masses palpated. No hepatosplenomegaly. Bowel sounds positive.  Musculoskeletal: no clubbing / cyanosis. No joint deformity upper and lower extremities.  Skin: no rashes, lesions, ulcers.  Neurologic: No gross focal neurologic deficit. Psychiatric: Normal mood and affect.   Labs on Admission: I have personally reviewed following labs and imaging studies  CBC: Recent Labs  Lab 01/24/19 0950 01/27/19 0500 01/28/19 0647 01/30/19 0021  WBC 14.1* 6.3  --  7.7  NEUTROABS  --  3.4  --  4.7  HGB 12.3 10.9*  11.0* 10.5* 12.2  HCT 37.2 34.5*  --  37.1  MCV 84.5 87.8  --  84.3  PLT 196 182  --  759   Basic Metabolic Panel: Recent Labs  Lab 01/24/19 0950 01/25/19 0547 01/27/19 0500 01/29/19 0359 01/30/19 0021  NA 132* 142 143 138 142  K 4.4 4.1 4.2 3.8 4.1  CL 95* 109 111 107 104  CO2 24 26 24 25  21*  GLUCOSE 420* 135* 124* 227* 328*  BUN 31* 20 10 18  26*  CREATININE 1.54* 1.06* 1.06* 1.17* 1.63*  CALCIUM 8.9 8.7* 8.5* 8.5* 9.2   GFR: Estimated Creatinine Clearance: 27.3 mL/min (A) (by C-G formula based on SCr of 1.63 mg/dL (H)). Liver Function Tests: Recent Labs  Lab 01/24/19 0950 01/25/19 0547 01/27/19 0500 01/29/19 0359 01/30/19 0021  AST 73* 31 34 19 25  ALT 100* 70* 51* 32 35  ALKPHOS 302* 232* 225* 171* 208*  BILITOT 0.8 0.9 0.5 0.4 0.7  PROT 8.2* 7.1 7.2 6.2* 8.0  ALBUMIN 3.8  3.5 3.3* 2.9* 3.7   Recent Labs  Lab 01/24/19 0950 01/25/19 0547 01/27/19 0500 01/29/19 0359 01/30/19 0021  LIPASE 163* 60* 146* 46 63*  AMYLASE  --   --  103*  --   --    No results for input(s): AMMONIA in the last 168 hours. Coagulation Profile: No results for input(s): INR, PROTIME in the last 168 hours. Cardiac Enzymes: No results for input(s): CKTOTAL, CKMB, CKMBINDEX, TROPONINI in the last 168 hours. BNP (last 3 results) No results for input(s): PROBNP in the last 8760 hours. HbA1C: No results for input(s): HGBA1C in the last 72 hours. CBG: Recent Labs  Lab 01/28/19 1659 01/28/19 2118 01/29/19 0744 01/29/19 1045 01/30/19 0156  GLUCAP 113* 221* 188* 190* 286*   Lipid Profile: No results for input(s): CHOL, HDL, LDLCALC, TRIG, CHOLHDL, LDLDIRECT in the last 72 hours. Thyroid Function Tests: No results for input(s): TSH, T4TOTAL, FREET4, T3FREE, THYROIDAB in the last 72 hours. Anemia Panel: No results for input(s): VITAMINB12, FOLATE, FERRITIN, TIBC, IRON, RETICCTPCT in the last 72 hours. Urine analysis:    Component Value Date/Time   COLORURINE YELLOW (A) 01/24/2019 1033   APPEARANCEUR CLOUDY (A) 01/24/2019 1033   APPEARANCEUR Hazy 08/11/2013 0729   LABSPEC 1.006 01/24/2019 1033   LABSPEC 1.030 08/11/2013 0729   PHURINE 6.0 01/24/2019 1033   GLUCOSEU >=500 (A) 01/24/2019 1033   GLUCOSEU >=500 08/11/2013 0729   HGBUR NEGATIVE 01/24/2019 1033   BILIRUBINUR NEGATIVE 01/24/2019 1033   BILIRUBINUR Negative 08/11/2013 0729   KETONESUR NEGATIVE 01/24/2019 1033   PROTEINUR 100 (A) 01/24/2019 1033   NITRITE NEGATIVE 01/24/2019 1033   LEUKOCYTESUR LARGE (A) 01/24/2019 1033   LEUKOCYTESUR Negative 08/11/2013 0729    Radiological Exams on Admission: DG Abdomen 1 View  Result Date: 01/30/2019 CLINICAL DATA:  Constipation, flare up of pancreatitis this week EXAM: ABDOMEN - 1 VIEW COMPARISON:  CT abdomen pelvis 01/24/2019 FINDINGS: No high-grade obstructive bowel  gas pattern is seen. Moderate stool burden. No suspicious calcifications overlying the gallbladder fossa or urinary tract. Osseous structures are unremarkable aside from mild degenerative changes in the hips spine. IMPRESSION: Moderate stool burden. No evidence of bowel obstruction. Electronically Signed   By: Lovena Le M.D.   On: 01/30/2019 01:02     Assessment/Plan Principal Problem:   Acute on chronic pancreatitis ([place]) e We will keep n.p.o. for now IV hydration, IV morphine for pain control, IV Zofran for nausea Continue  home [place]    Acute renal failure superimposed on stage 3a chronic kidney disease ([place]) IV hydration monitor renal function    Hyperglycemia due to type 2 diabetes mellitus ([place]) Blood sugar elevated at 328 Supplemental insulin coverage. Last A1c on file from 01/23/2018 was 11.8          DVT prophylaxis: lovenox  Code Status: full code  Family Communication: none  Disposition Plan: Back to previous home environment Consults called: none     Athena Masse MD [facility]     01/30/2019, 2:21 AM

## 2019-01-30 NOTE — Progress Notes (Signed)
Ch visited Pt to complete Order Req for prayer. Ch introduced self and asked if Pt had requested for prayer. Pt said that yes, and that she would like prayer for family during this pandemic, and also for her system to start accepting solid foods so she can go home to get back to all the tasks she has put on hold. [place] prayed with Pt for healing and recovery as per Pt's request. Pt expressed desire for Ch to come back again tomorrow to pray again with her. Ch agreed to coming back tomorrow, but also let Pt know that another chaplain will be available in the evening if she needed prayer then.    01/30/19 1046  Clinical Encounter Type  Visited With Patient  Visit Type Initial;Spiritual support;Social support  Referral From Patient;Nurse  Consult/Referral To Chaplain  Spiritual Encounters  Spiritual Needs Prayer;Emotional  Stress Factors  Patient Stress Factors Health changes

## 2019-01-30 NOTE — ED Provider Notes (Signed)
[facility] Emergency Department Provider Note   ____________________________________________   First MD Initiated Contact with Patient 01/30/19 0020     (approximate)  I have reviewed the triage vital signs and the nursing notes.   HISTORY  Chief Complaint Abdominal Pain    HPI Karen [name] is a 64 y.o. female brought to the ED from home via EMS with a chief complaint of abdominal pain.  Patient has a history of acute on chronic pancreatitis and was just hospitalized from 1/19-1/24 for same.  Had a negative EGD during that hospital stay.  Her pancrelipase dosage was increased and she was discharged home on hydrocodone and an antibiotic for UTI.  She was discharged around noon and states since that time she has eaten a [place] sandwich, milk and grapefruit juice.  Went to bed around 7 PM feeling fine and awoke around 11 PM unable to urinate with increased epigastric pain.  States she last moved her bowels last Thursday after medicines received in the hospital.  Denies fever, cough, chest pain, shortness of breath.  Had one episode of nausea/vomiting.       Past Medical History:  Diagnosis Date  . Alcoholic pancreatitis   . Asthma   . Depression   . Diabetes mellitus without complication ([place])   . DKA (diabetic ketoacidoses) ([place]) 02/17/2015  . Emphysematous cystitis   . Hypercholesteremia   . Hypertension   . Hypokalemia     Patient Active Problem List   Diagnosis Date Noted  . Constipation   . Acute on chronic pancreatitis ([place]) 01/25/2019  . Acute pain of left shoulder   . Adhesive capsulitis of left shoulder   . Abnormal LFTs 01/24/2019  . Cough 01/24/2019  . Hypertension   . Hypercholesteremia   . Type 2 diabetes mellitus with hyperlipidemia ([place])   . GERD (gastroesophageal reflux disease)   . Acute renal failure superimposed on stage 3a chronic kidney disease ([place])   . Pancreatitis, recurrent   . Chronic pancreatitis ([place]) 09/25/2018  .  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 Coffee ground emesis   . Upper GI bleed 11/11/2017  . Pancreatitis, acute 01/03/2017  . ETOH abuse 01/03/2017  . UTI (urinary tract infection) 01/03/2017  . Nausea and vomiting   . Esophageal candidiasis ([place])   . Intractable vomiting with nausea   . Hypertensive urgency 04/21/2016  . Abdominal pain   . Acute renal failure ([place])   . Nausea vomiting and diarrhea   . Smoker   . Poorly controlled type 2 diabetes mellitus ([place])   . Acute renal insufficiency 10/29/2015  . Elevated troponin 10/29/2015  . Urinary tract infection without hematuria 10/29/2015  . Left flank pain 10/29/2015  . Malignant essential hypertension 10/29/2015  . DKA (diabetic ketoacidoses) ([place]) 02/17/2015  . Emphysematous cystitis     Past Surgical History:  Procedure Laterality Date  . ABDOMINAL HYSTERECTOMY  1996  . APPENDECTOMY  1997  . ESOPHAGOGASTRODUODENOSCOPY N/A 11/12/2017   Procedure: ESOPHAGOGASTRODUODENOSCOPY (EGD);  Surgeon: Lin Landsman, MD;  [facility] END[facility];  Service: Gastroenterology;  Laterality: N/A;  . ESOPHAGOGASTRODUODENOSCOPY N/A 09/29/2018   Procedure: ESOPHAGOGASTRODUODENOSCOPY (EGD);  Surgeon: Toledo, Benay Pike, MD;  Location: [facility] ENDOSCOPY;  Service: Gastroenterology;  Laterality: N/A;  . ESOPHAGOGASTRODUODENOSCOPY (EGD) WITH PROPOFOL N/A 04/24/2016   Procedure: ESOPHAGOGASTRODUODENOSCOPY (EGD) WITH PROPOFOL;  Surgeon: [name], MD;  Location: [facility] ENDOSCOPY;  Service: Endoscopy;  Laterality: N/A;  . ESOPHAGOGASTRODUODENOSCOPY (EGD) WITH PROPOFOL N/A 01/28/2019   Procedure: ESOPHAGOGASTRODUODENOSCOPY (EGD)  WITH PROPOFOL;  Surgeon: Toledo, Benay Pike, MD;  Location: [facility] ENDOSCOPY;  Service: Gastroenterology;  Laterality: N/A;  . HAND SURGERY  1988  . THYROID SURGERY  2013    Prior to Admission medications   Medication Sig Start Date End Date Taking? Authorizing Provider  amLODipine (NORVASC) 10 MG tablet Take 10 mg by mouth  daily.    [provider]  cyclobenzaprine (FLEXERIL) 5 MG tablet Take 1 tablet (5 mg total) by mouth 2 (two) times daily. Patient taking differently: Take 5 mg by mouth 2 (two) times daily as needed for muscle spasms.  01/07/17   Dustin Flock, MD  HYDROcodone-acetaminophen (NORCO) 10-325 MG tablet Take 1 tablet by mouth 4 (four) times daily.     [provider]  insulin aspart (NOVOLOG) 100 UNIT/ML injection Inject 4 Units into the skin 3 (three) times daily with meals. 01/29/19   Wieting, Richard, MD  insulin detemir (LEVEMIR) 100 unit/ml SOLN Inject 0.06 mLs (6 Units total) into the skin at bedtime. 01/29/19   Loletha Grayer, MD  Insulin Pen Needle 29G X 12.7MM MISC 1 Device by Does not apply route at bedtime. 01/29/19   Loletha Grayer, MD  lipase/protease/amylase (CREON) 36000 UNITS CPEP capsule Take 2 capsules (72,000 Units total) by mouth 3 (three) times daily with meals. 01/29/19   Loletha Grayer, MD  nystatin (MYCOSTATIN/NYSTOP) powder Apply topically 2 (two) times daily. 01/29/19   Loletha Grayer, MD  omeprazole (PRILOSEC) 40 MG capsule Take 1 capsule (40 mg total) by mouth 2 (two) times daily for 14 days. 09/22/18 01/24/19  Merlyn Lot, MD  pregabalin (LYRICA) 150 MG capsule Take 150 mg by mouth 2 (two) times daily.    [provider]  rosuvastatin (CRESTOR) 20 MG tablet Take 20 mg by mouth daily.    [provider]  GLUCERNA (GLUCERNA) LIQD Take 237 mLs by mouth 3 (three) times daily between meals. 02/19/15 02/27/15  Bettey Costa, MD    Allergies Penicillins, Reglan [metoclopramide], and Percocet [oxycodone-acetaminophen]  Family History  Family history unknown: Yes    Social History Social History   Tobacco Use  . Smoking status: Former Research scientist (life sciences)  . Smokeless tobacco: Never Used  . Tobacco comment: quit 7 years ago   Substance Use Topics  . Alcohol use: Not Currently    Comment: quit drinking 2 years ago   . Drug use: No    Review  of Systems  Constitutional: No fever/chills Eyes: No visual changes. ENT: No sore throat. Cardiovascular: Denies chest pain. Respiratory: Denies shortness of breath. Gastrointestinal: Positive for abdominal pain and one episode of nausea/vomiting.  No diarrhea.  No constipation. Genitourinary: Negative for dysuria. Musculoskeletal: Negative for back pain. Skin: Negative for rash. Neurological: Negative for headaches, focal weakness or numbness.   ____________________________________________   PHYSICAL EXAM:  VITAL SIGNS: ED Triage Vitals [01/30/19 0017]  Enc Vitals Group     BP      Pulse      Resp      Temp      Temp src      SpO2      Weight 114 lb 10.2 oz (52 kg)     Height 5\' 1"  (1.549 m)     Head Circumference      Peak Flow      Pain Score 10     Pain Loc      Pain Edu?      Excl. in [place]?     Constitutional: Alert and oriented. Well  appearing and in mild acute distress. Eyes: Conjunctivae are normal. PERRL. EOMI. Head: Atraumatic. Nose: No congestion/rhinnorhea. Mouth/Throat: Mucous membranes are moist.  Oropharynx non-erythematous. Neck: No stridor.   Cardiovascular: Normal rate, regular rhythm. Grossly normal heart sounds.  Good peripheral circulation. Respiratory: Normal respiratory effort.  No retractions. Lungs CTAB. Gastrointestinal: Soft and mildly tender to palpation epigastrium without rebound or guarding. No distention. No abdominal bruits. No CVA tenderness. Musculoskeletal: No lower extremity tenderness nor edema.  No joint effusions. Neurologic:  Normal speech and language. No gross focal neurologic deficits are appreciated. No gait instability. Skin:  Skin is warm, dry and intact. No rash noted. Psychiatric: Mood and affect are normal. Speech and behavior are normal.  ____________________________________________   LABS (all labs ordered are listed, but only abnormal results are displayed)  Labs Reviewed  COMPREHENSIVE METABOLIC PANEL -  Abnormal; Notable for the following components:      Result Value   CO2 21 (*)    Glucose, Bld 328 (*)    BUN 26 (*)    Creatinine, Ser 1.63 (*)    Alkaline Phosphatase 208 (*)    GFR calc non Af Amer 34 (*)    GFR calc Af Amer 39 (*)    Anion gap 17 (*)    All other components within normal limits  LIPASE, BLOOD - Abnormal; Notable for the following components:   Lipase 63 (*)    All other components within normal limits  CBC WITH DIFFERENTIAL/PLATELET  ETHANOL  TROPONIN I (HIGH SENSITIVITY)   ____________________________________________  EKG  ED ECG REPORT I, Jeramy Dimmick J, the attending physician, personally viewed and interpreted this ECG.   Date: 01/30/2019  EKG Time: 0020  Rate: 91  Rhythm: normal EKG, normal sinus rhythm  Axis: Normal  Intervals:none  ST&T Change: Nonspecific  ____________________________________________  RADIOLOGY  ED MD interpretation: Moderate stool burden; no SBO  Official radiology report(s): DG Abdomen 1 View  Result Date: 01/30/2019 CLINICAL DATA:  Constipation, flare up of pancreatitis this week EXAM: ABDOMEN - 1 VIEW COMPARISON:  CT abdomen pelvis 01/24/2019 FINDINGS: No high-grade obstructive bowel gas pattern is seen. Moderate stool burden. No suspicious calcifications overlying the gallbladder fossa or urinary tract. Osseous structures are unremarkable aside from mild degenerative changes in the hips spine. IMPRESSION: Moderate stool burden. No evidence of bowel obstruction. Electronically Signed   By: Lovena Le M.D.   On: 01/30/2019 01:02    ____________________________________________   PROCEDURES  Procedure(s) performed (including Critical Care):  Procedures  CRITICAL CARE Performed by: Paulette Blanch   Total critical care time: 30 minutes  Critical care time was exclusive of separately billable procedures and treating other patients.  Critical care was necessary to treat or prevent imminent or life-threatening  deterioration.  Critical care was time spent personally by me on the following activities: development of treatment plan with patient and/or surrogate as well as nursing, discussions with consultants, evaluation of patient's response to treatment, examination of patient, obtaining history from patient or surrogate, ordering and performing treatments and interventions, ordering and review of laboratory studies, ordering and review of radiographic studies, pulse oximetry and re-evaluation of patient's condition.  ____________________________________________   INITIAL IMPRESSION / ASSESSMENT AND PLAN / ED COURSE  As part of my medical decision making, I reviewed the following data within the [facility] notes reviewed and incorporated, Labs reviewed, EKG interpreted, Old chart reviewed, Radiograph reviewed, Notes from prior ED visits and [place] Controlled Substance Database     Karen  CARTNEY [name] was evaluated in Emergency Department on 01/30/2019 for the symptoms described in the history of present illness. She was evaluated in the context of the global COVID-19 pandemic, which necessitated consideration that the patient might be at risk for infection with the SARS-CoV-2 virus that causes COVID-19. Institutional protocols and algorithms that pertain to the evaluation of patients at risk for COVID-19 are in a state of rapid change based on information released by regulatory bodies including the CDC and federal and state organizations. These policies and algorithms were followed during the patient's care in the ED.    64 year old female with chronic pancreatitis who was just discharged at noon yesterday for pancreatitis flareup returns for abdominal pain. Differential diagnosis includes, but is not limited to, biliary disease (biliary colic, acute cholecystitis, cholangitis, choledocholithiasis, etc), intrathoracic causes for epigastric abdominal pain including ACS, gastritis, duodenitis,  pancreatitis, small bowel or large bowel obstruction, abdominal aortic aneurysm, hernia, and ulcer(s).  Will obtain basic lab work, LFTs and lipase.  Initiate IV fluid resuscitation, IV Dilaudid for pain.  With IV Zofran for nausea.  Clinical Course as of Jan 29 126  Mon Jan 30, 2019  0118 Laboratory results noted.  Lipase and creatinine are both increased from less than 24 hours ago.  Also noted mild elevation of anion gap.  Will continue IV fluid resuscitation, subcu insulin. Discuss with hospitalist services for admission.   [JS]    Clinical Course User Index [JS] Paulette Blanch, MD     ____________________________________________   FINAL CLINICAL IMPRESSION(S) / ED DIAGNOSES  Final diagnoses:  Epigastric pain  Acute pancreatitis, unspecified complication status, unspecified pancreatitis type  AKI (acute kidney injury) [facility])     ED Discharge Orders    None       Note:  This document was prepared using Dragon voice recognition software and may include unintentional dictation errors.   Paulette Blanch, MD 01/30/19 [id number]

## 2019-01-31 ENCOUNTER — Encounter: Payer: Medicaid Other | Admitting: Physical Therapy

## 2019-01-31 DIAGNOSIS — K861 Other chronic pancreatitis: Secondary | ICD-10-CM | POA: Diagnosis not present

## 2019-01-31 DIAGNOSIS — K859 Acute pancreatitis without necrosis or infection, unspecified: Secondary | ICD-10-CM | POA: Diagnosis not present

## 2019-01-31 LAB — CBC WITH DIFFERENTIAL/PLATELET
Abs Immature Granulocytes: 0.08 10*3/uL — ABNORMAL HIGH (ref 0.00–0.07)
Basophils Absolute: 0.1 10*3/uL (ref 0.0–0.1)
Basophils Relative: 1 %
Eosinophils Absolute: 0.2 10*3/uL (ref 0.0–0.5)
Eosinophils Relative: 2 %
HCT: 30.4 % — ABNORMAL LOW (ref 36.0–46.0)
Hemoglobin: 10.1 g/dL — ABNORMAL LOW (ref 12.0–15.0)
Immature Granulocytes: 1 %
Lymphocytes Relative: 17 %
Lymphs Abs: 1.8 10*3/uL (ref 0.7–4.0)
MCH: 28.1 pg (ref 26.0–34.0)
MCHC: 33.2 g/dL (ref 30.0–36.0)
MCV: 84.7 fL (ref 80.0–100.0)
Monocytes Absolute: 0.9 10*3/uL (ref 0.1–1.0)
Monocytes Relative: 8 %
Neutro Abs: 7.6 10*3/uL (ref 1.7–7.7)
Neutrophils Relative %: 71 %
Platelets: 202 10*3/uL (ref 150–400)
RBC: 3.59 MIL/uL — ABNORMAL LOW (ref 3.87–5.11)
RDW: 13.2 % (ref 11.5–15.5)
WBC: 10.6 10*3/uL — ABNORMAL HIGH (ref 4.0–10.5)
nRBC: 0 % (ref 0.0–0.2)

## 2019-01-31 LAB — BASIC METABOLIC PANEL
Anion gap: 8 (ref 5–15)
BUN: 12 mg/dL (ref 8–23)
CO2: 22 mmol/L (ref 22–32)
Calcium: 8 mg/dL — ABNORMAL LOW (ref 8.9–10.3)
Chloride: 106 mmol/L (ref 98–111)
Creatinine, Ser: 1.03 mg/dL — ABNORMAL HIGH (ref 0.44–1.00)
GFR calc Af Amer: >60 mL/min (ref >60)
GFR calc non Af Amer: 59 mL/min — ABNORMAL LOW (ref >60)
Glucose, Bld: 342 mg/dL — ABNORMAL HIGH (ref 70–99)
Potassium: 3.9 mmol/L (ref 3.5–5.1)
Sodium: 136 mmol/L (ref 135–145)

## 2019-01-31 LAB — GLUCOSE, CAPILLARY
Glucose-Capillary: 158 mg/dL — ABNORMAL HIGH (ref 70–99)
Glucose-Capillary: 296 mg/dL — ABNORMAL HIGH (ref 70–99)
Glucose-Capillary: 354 mg/dL — ABNORMAL HIGH (ref 70–99)

## 2019-01-31 NOTE — Discharge Summary (Signed)
Karen [name] [id number] DOB: 29-Jan-1955 DOA: 01/30/2019  PCP: Novella Rob, FNP  Admit date: 01/30/2019 Discharge date: 01/31/2019  Admitted From: home Disposition:  home  Recommendations for Outpatient Follow-up:  1. Follow up with PCP in 1 week 2. Please obtain BMP/CBC in one week 3. Please follow up on the following pending results:none    Discharge Condition:Stable CODE STATUS: Full Diet recommendation:Diabetic Brief/Interim Summary: Karen [name] is a 64 y.o. female with medical history significant for hypertension, hyperlipidemia, diabetes mellitus, asthma, GERD, depression, anxiety, DKA, alcoholic pancreatitis, CKD-3, alcohol abuse in remission, hospitalized from 01/24/2019 to 01/29/2019 with acute on chronic pancreatitis who returns to the emergency room with nausea vomiting, and abdominal pain.  She states she was feeling fine when she left the hospital and when she got home had a [place] sandwich and some grapefruit juice and was doing well until tonight when she awoke with abdominal pain and vomited twice.  Her pain is in the upper abdomen, constant, nonradiating and of moderate intensity.  She denies fever, chills, chest pain or shortness of breath.  In the ED she was tachycardiac with heart rate of 106, lipase at 63 and her creatinine was 1.63 with blood sugars elevated at 328.  She also has slightly increased anion gap of 17.  He was started IV fluids and pain management.  Abdominal x-ray showed moderate stool burden but no bowel obstruction.  She was initially kept n.p.o. than when she requested for food she was started on clears and she has been tolerating her diet.  Today she is doing better.  Her creatinine has normalized.  We discussed her blood glucose levels and tight glucose control at home.  She will need to follow-up with her primary care for further evaluation.  He was admitted for acute on chronic pancreatitis which improved.  Her acute renal failure superimposed on stage  IIIa chronic kidney disease also improved with IVF fluids .  Discharge Diagnoses:  Principal Problem:   Acute on chronic pancreatitis ([place]) Active Problems:   Acute renal failure superimposed on stage 3a chronic kidney disease ([facility])   Hyperglycemia due to type 2 diabetes mellitus ([place])   Acute pancreatitis    Discharge Instructions  Discharge Instructions     Call MD for:  persistant nausea and vomiting   Complete by: As directed    Diet - low sodium heart healthy   Complete by: As directed    Carb controlled   Discharge instructions   Complete by: As directed    Need to be on a carb controlled diet Follow-up with your primary care in 1 week   Increase activity slowly   Complete by: As directed       Allergies as of 01/31/2019       Reactions   Penicillins Anaphylaxis   Has patient had a PCN reaction causing immediate rash, facial/tongue/throat swelling, SOB or lightheadedness with hypotension: Yes Has patient had a PCN reaction causing severe rash involving mucus membranes or skin necrosis: No Has patient had a PCN reaction that required hospitalization No Has patient had a PCN reaction occurring within the last 10 years: No If all of the above answers are "NO", then may proceed with Cephalosporin use.   Reglan [metoclopramide] Other (See Comments)   Hypotension, shortness of breath   Percocet [oxycodone-acetaminophen] Rash        Medication List     TAKE these medications    amLODipine 10 MG tablet Commonly known as: NORVASC Take  10 mg by mouth daily.   cyclobenzaprine 5 MG tablet Commonly known as: FLEXERIL Take 1 tablet (5 mg total) by mouth 2 (two) times daily. What changed:   when to take this  reasons to take this   HYDROcodone-acetaminophen 10-325 MG tablet Commonly known as: NORCO Take 1 tablet by mouth 4 (four) times daily.   insulin aspart 100 UNIT/ML injection Commonly known as: novoLOG Inject 4 Units into the skin 3 (three) times daily  with meals. What changed:   how much to take  when to take this   insulin detemir 100 unit/ml Soln Commonly known as: LEVEMIR Inject 0.06 mLs (6 Units total) into the skin at bedtime.   Insulin Pen Needle 29G X 12.7MM Misc 1 Device by Does not apply route at bedtime.   lipase/protease/amylase 36000 UNITS Cpep capsule Commonly known as: Creon Take 2 capsules (72,000 Units total) by mouth 3 (three) times daily with meals.   nystatin powder Commonly known as: MYCOSTATIN/NYSTOP Apply topically 2 (two) times daily.   omeprazole 40 MG capsule Commonly known as: PRILOSEC Take 1 capsule (40 mg total) by mouth 2 (two) times daily for 14 days.   pregabalin 150 MG capsule Commonly known as: LYRICA Take 150 mg by mouth 2 (two) times daily.   rosuvastatin 20 MG tablet Commonly known as: CRESTOR Take 20 mg by mouth daily.       Follow-up Information     Novella Rob, FNP In 1 week.   Specialty: Family Medicine Why: PLEASE CALL TO SCHEDULE Contact information: [place] [address] [phone]           Allergies  Allergen Reactions  . Penicillins Anaphylaxis    Has patient had a PCN reaction causing immediate rash, facial/tongue/throat swelling, SOB or lightheadedness with hypotension: Yes Has patient had a PCN reaction causing severe rash involving mucus membranes or skin necrosis: No Has patient had a PCN reaction that required hospitalization No Has patient had a PCN reaction occurring within the last 10 years: No If all of the above answers are "NO", then may proceed with Cephalosporin use.  . Reglan [Metoclopramide] Other (See Comments)    Hypotension, shortness of breath  . Percocet [Oxycodone-Acetaminophen] Rash    Consultations:  None   Procedures/Studies: . DG Chest 2 View  Result Date: 01/24/2019 CLINICAL DATA:  Shortness of breath.  Fever. EXAM: CHEST - 2 VIEW COMPARISON:  September 22, 2018. FINDINGS: The heart size and  mediastinal contours are within normal limits. Both lungs are clear. No pneumothorax or pleural effusion is noted. The visualized skeletal structures are unremarkable. IMPRESSION: No active cardiopulmonary disease. Electronically Signed   By: Marijo Conception M.D.   On: 01/24/2019 10:17   DG Abdomen 1 View  Result Date: 01/30/2019 CLINICAL DATA:  Constipation, flare up of pancreatitis this week EXAM: ABDOMEN - 1 VIEW COMPARISON:  CT abdomen pelvis 01/24/2019 FINDINGS: No high-grade obstructive bowel gas pattern is seen. Moderate stool burden. No suspicious calcifications overlying the gallbladder fossa or urinary tract. Osseous structures are unremarkable aside from mild degenerative changes in the hips spine. IMPRESSION: Moderate stool burden. No evidence of bowel obstruction. Electronically Signed   By: Lovena Le M.D.   On: 01/30/2019 01:02   CT Head Wo Contrast  Result Date: 01/24/2019 CLINICAL DATA:  Headache, fevers EXAM: CT HEAD WITHOUT CONTRAST TECHNIQUE: Contiguous axial images were obtained from the base of the skull through the vertex without intravenous contrast. COMPARISON:  01/03/2017  FINDINGS: Brain: There is no acute intracranial hemorrhage, mass-effect, or edema. Gray-white differentiation is preserved. There is no extra-axial fluid collection. Ventricles and sulci are within normal limits in size and configuration. Vascular: There is atherosclerotic calcification at the skull base. Skull: Calvarium is unremarkable. Sinuses/Orbits: Patchy paranasal sinus mucosal thickening. Visualized orbits are unremarkable. Other: None. IMPRESSION: No acute intracranial abnormality. Electronically Signed   By: Macy Mis M.D.   On: 01/24/2019 12:50   CT Angio Chest PE W and/or Wo Contrast  Result Date: 01/24/2019 CLINICAL DATA:  Shortness of breath, abdominal pain, nausea, vomiting, generalized weakness, fevers of 102-104 since yesterday, body aches, dry cough EXAM: CT ANGIOGRAPHY CHEST CT ABDOMEN  AND PELVIS WITH CONTRAST TECHNIQUE: Multidetector CT imaging of the chest was performed using the standard protocol during bolus administration of intravenous contrast. Multiplanar CT image reconstructions and MIPs were obtained to evaluate the vascular anatomy. Multidetector CT imaging of the abdomen and pelvis was performed using the standard protocol during bolus administration of intravenous contrast. CONTRAST:  12mL OMNIPAQUE IOHEXOL 350 MG/ML SOLN IV. No oral contrast. COMPARISON:  CT abdomen and pelvis 09/22/2018, CT chest 05/29/2011 FINDINGS: CTA CHEST FINDINGS Cardiovascular: Scattered atherosclerotic calcifications aorta, proximal great vessels, and coronary arteries. Aorta normal caliber. No aneurysm or dissection. Pulmonary arteries adequately opacified and patent. No evidence of pulmonary embolism. Heart normal size. No pericardial effusion. Mediastinum/Nodes: Absence of RIGHT thyroid lobe. Beam hardening artifacts LEFT thyroid lobe. No thoracic adenopathy. Esophagus unremarkable. Lungs/Pleura: Peribronchial thickening. Minimal scattered atelectasis. No definite acute infiltrate, pleural effusion or pneumothorax. Musculoskeletal: No acute osseous findings. Review of the MIP images confirms the above findings. CT ABDOMEN and PELVIS FINDINGS Hepatobiliary: Nonspecific low-attenuation lesion posterior RIGHT lobe liver superiorly 9 x 8 mm image 11 unchanged. Gallbladder and liver otherwise normal appearance Pancreas: Normal appearance Spleen: Normal appearance Adrenals/Urinary Tract: Adrenal glands, kidneys, and ureters normal appearance. Bladder well distended with minimally thickened wall, nonspecific. No definite bladder mass. Stomach/Bowel: Appendix reportedly surgically absent though a short normal appearing appendiceal base is identified 1.7 cm length. Stool throughout colon. Scattered colonic diverticula without evidence of diverticulitis. Stomach and bowel loops otherwise normal appearance  Vascular/Lymphatic: Atherosclerotic calcifications without aneurysm. No adenopathy. Reproductive: Uterus surgically absent with normal sized ovaries Other: No free air or free fluid.  No acute inflammatory process. Musculoskeletal: Osseous structures unremarkable. Small lipoma of the distal RIGHT iliopsoas. Review of the MIP images confirms the above findings. IMPRESSION: Scattered atherosclerotic calcifications including coronary arteries without aortic aneurysm or dissection. No evidence of pulmonary embolism. Scattered colonic diverticulosis without evidence of diverticulitis. Scattered atelectasis in both lungs. Stable 9 mm nonspecific liver lesion. Aortic Atherosclerosis (ICD10-I70.0). Electronically Signed   By: Lavonia Dana M.D.   On: 01/24/2019 12:54   CT ABDOMEN PELVIS W CONTRAST  Result Date: 01/24/2019 CLINICAL DATA:  Shortness of breath, abdominal pain, nausea, vomiting, generalized weakness, fevers of 102-104 since yesterday, body aches, dry cough EXAM: CT ANGIOGRAPHY CHEST CT ABDOMEN AND PELVIS WITH CONTRAST TECHNIQUE: Multidetector CT imaging of the chest was performed using the standard protocol during bolus administration of intravenous contrast. Multiplanar CT image reconstructions and MIPs were obtained to evaluate the vascular anatomy. Multidetector CT imaging of the abdomen and pelvis was performed using the standard protocol during bolus administration of intravenous contrast. CONTRAST:  28mL OMNIPAQUE IOHEXOL 350 MG/ML SOLN IV. No oral contrast. COMPARISON:  CT abdomen and pelvis 09/22/2018, CT chest 05/29/2011 FINDINGS: CTA CHEST FINDINGS Cardiovascular: Scattered atherosclerotic calcifications aorta, proximal great vessels, and coronary arteries. Aorta  normal caliber. No aneurysm or dissection. Pulmonary arteries adequately opacified and patent. No evidence of pulmonary embolism. Heart normal size. No pericardial effusion. Mediastinum/Nodes: Absence of RIGHT thyroid lobe. Beam hardening  artifacts LEFT thyroid lobe. No thoracic adenopathy. Esophagus unremarkable. Lungs/Pleura: Peribronchial thickening. Minimal scattered atelectasis. No definite acute infiltrate, pleural effusion or pneumothorax. Musculoskeletal: No acute osseous findings. Review of the MIP images confirms the above findings. CT ABDOMEN and PELVIS FINDINGS Hepatobiliary: Nonspecific low-attenuation lesion posterior RIGHT lobe liver superiorly 9 x 8 mm image 11 unchanged. Gallbladder and liver otherwise normal appearance Pancreas: Normal appearance Spleen: Normal appearance Adrenals/Urinary Tract: Adrenal glands, kidneys, and ureters normal appearance. Bladder well distended with minimally thickened wall, nonspecific. No definite bladder mass. Stomach/Bowel: Appendix reportedly surgically absent though a short normal appearing appendiceal base is identified 1.7 cm length. Stool throughout colon. Scattered colonic diverticula without evidence of diverticulitis. Stomach and bowel loops otherwise normal appearance Vascular/Lymphatic: Atherosclerotic calcifications without aneurysm. No adenopathy. Reproductive: Uterus surgically absent with normal sized ovaries Other: No free air or free fluid.  No acute inflammatory process. Musculoskeletal: Osseous structures unremarkable. Small lipoma of the distal RIGHT iliopsoas. Review of the MIP images confirms the above findings. IMPRESSION: Scattered atherosclerotic calcifications including coronary arteries without aortic aneurysm or dissection. No evidence of pulmonary embolism. Scattered colonic diverticulosis without evidence of diverticulitis. Scattered atelectasis in both lungs. Stable 9 mm nonspecific liver lesion. Aortic Atherosclerosis (ICD10-I70.0). Electronically Signed   By: Lavonia Dana M.D.   On: 01/24/2019 12:54   MR SHOULDER LEFT WO CONTRAST  Result Date: 01/25/2019 CLINICAL DATA:  Left shoulder pain and limited range of motion for several months. No known injury. EXAM: MRI OF  THE LEFT SHOULDER WITHOUT CONTRAST TECHNIQUE: Multiplanar, multisequence MR imaging of the shoulder was performed. No intravenous contrast was administered. COMPARISON:  None. FINDINGS: Rotator cuff: Intact. Heterogeneously increased T2 signal in the rotator cuff tendons is most notable in the supraspinatus. Muscles:  Normal. No atrophy or focal lesion Biceps long head:  Intact. Acromioclavicular Joint: Minimal osteoarthritis. Type 2 acromion. Small volume of subacromial/subdeltoid fluid noted Glenohumeral Joint: There is thickening and intermediate increased T2 signal in the inferior glenohumeral ligament compatible with adhesive capsulitis. Otherwise negative Labrum:  Intact. Bones:  Negative. Other: None. IMPRESSION: 1. Findings compatible with adhesive capsulitis. 2. Rotator cuff tendinopathy without tear. 3. Minimal acromioclavicular osteoarthritis. 4. Small volume of subacromial/subdeltoid fluid compatible with bursitis. Electronically Signed   By: Inge Rise M.D.   On: 01/25/2019 14:21   US Abdomen Limited RUQ  Result Date: 01/27/2019 CLINICAL DATA:  Increasing abdominal pain EXAM: ULTRASOUND ABDOMEN LIMITED RIGHT UPPER QUADRANT COMPARISON:  01/24/2010 FINDINGS: Gallbladder: Gallbladder is well distended. Mild gallbladder sludge is seen. The wall is mildly thickened at 3.9 mm with a negative sonographic Murphy's sign. This may be related to incomplete distension. Common bile duct: Diameter: 4.2 mm. Liver: Previously seen hyperechoic lesion is not well appreciated on today's exam due to patient positioning. No acute abnormality in the liver is seen. Portal vein is patent on color Doppler imaging with normal direction of blood flow towards the liver. Other: None. IMPRESSION: Mild gallbladder sludge. Mild gallbladder wall thickening is seen although this is felt to be related to incomplete distension of the gallbladder. No pericholecystic fluid is seen and a negative sonographic Murphy's sign is noted.  No acute abnormality is seen. Electronically Signed   By: Inez Catalina M.D.   On: 01/27/2019 09:48   US Abdomen Limited RUQ  Result Date: 01/25/2019 CLINICAL DATA:  Abdominal pain EXAM: ULTRASOUND ABDOMEN LIMITED RIGHT UPPER QUADRANT COMPARISON:  CT from the previous day. FINDINGS: Gallbladder: No gallstones or wall thickening visualized. No sonographic Murphy sign noted by sonographer. Common bile duct: Diameter: 5.8 mm. Liver: Small 1 cm hyperechoic lesion is noted along the dome of the liver on the right consistent with a small hemangioma. This corresponds to a hypodense lesion seen on recent CT. Portal vein is patent on color Doppler imaging with normal direction of blood flow towards the liver. Other: None. IMPRESSION: Stable hemangioma within the right lobe of the liver. No other focal abnormality is noted. Electronically Signed   By: Inez Catalina M.D.   On: 01/25/2019 08:09       Subjective: Patient without any complaints today.  Feeling better.  No abdominal pain nausea or vomiting.  Ambulating without any issues.  Discharge Exam: Vitals:   01/30/19 2021 01/31/19 0502  BP: (!) 130/59 140/75  Pulse: 79 79  Resp: 16 16  Temp: 97.7 F (36.5 C) 99 F (37.2 C)  SpO2: 98% 99%   Vitals:   01/30/19 0857 01/30/19 1335 01/30/19 2021 01/31/19 0502  BP: (!) 141/73 (!) 145/72 (!) 130/59 140/75  Pulse: 76 89 79 79  Resp: 16 20 16 16   Temp: 98 F (36.7 C) 98.6 F (37 C) 97.7 F (36.5 C) 99 F (37.2 C)  TempSrc: Oral Oral Oral Oral  SpO2: 100% 96% 98% 99%  Weight:      Height:        General: Pt is alert, awake, not in acute distress Cardiovascular: RRR, S1/S2 +, no rubs, no gallops Respiratory: CTA bilaterally, no wheezing, no rhonchi Abdominal: Soft, NT, ND, bowel sounds + Extremities: no edema, no cyanosis    The results of significant diagnostics from this hospitalization (including imaging, microbiology, ancillary and laboratory) are listed below for reference.      Microbiology: Recent Results (from the past 240 hour(s))  Respiratory Panel by RT PCR (Flu A&B, Covid) - Nasopharyngeal Swab     Status: None   Collection Time: 01/24/19 10:33 AM   Specimen: Nasopharyngeal Swab  Result Value Ref Range Status   SARS Coronavirus 2 by RT PCR NEGATIVE NEGATIVE Final    Comment: (NOTE) SARS-CoV-2 target nucleic acids are NOT DETECTED. The SARS-CoV-2 RNA is generally detectable in upper respiratoy specimens during the acute phase of infection. The lowest concentration of SARS-CoV-2 viral copies this assay can detect is 131 copies/mL. A negative result does not preclude SARS-Cov-2 infection and should not be used as the sole basis for treatment or other patient management decisions. A negative result may occur with  improper specimen collection/handling, submission of specimen other than nasopharyngeal swab, presence of viral mutation(s) within the areas targeted by this assay, and inadequate number of viral copies (<131 copies/mL). A negative result must be combined with clinical observations, patient history, and epidemiological information. The expected result is Negative. Fact Sheet for Patients:  PinkCheek.be Fact Sheet for Healthcare Providers:  GravelBags.it This test is not yet ap proved or cleared by the [place] FDA and  has been authorized for detection and/or diagnosis of SARS-CoV-2 by FDA under an Emergency Use Authorization (EUA). This EUA will remain  in effect (meaning this test can be used) for the duration of the COVID-19 declaration under Section 564(b)(1) of the Act, 21 U.S.C. section 360bbb-3(b)(1), unless the authorization is terminated or revoked sooner.    Influenza A by PCR NEGATIVE NEGATIVE Final   Influenza B by PCR  NEGATIVE NEGATIVE Final    Comment: (NOTE) The Xpert Xpress SARS-CoV-2/FLU/RSV assay is intended as an aid in  the diagnosis of influenza from  Nasopharyngeal swab specimens and  should not be used as a sole basis for treatment. Nasal washings and  aspirates are unacceptable for Xpert Xpress SARS-CoV-2/FLU/RSV  testing. Fact Sheet for Patients: PinkCheek.be Fact Sheet for Healthcare Providers: GravelBags.it This test is not yet approved or cleared by the [place] FDA and  has been authorized for detection and/or diagnosis of SARS-CoV-2 by  FDA under an Emergency Use Authorization (EUA). This EUA will remain  in effect (meaning this test can be used) for the duration of the  Covid-19 declaration under Section 564(b)(1) of the Act, 21  U.S.C. section 360bbb-3(b)(1), unless the authorization is  terminated or revoked. Performed at [facility], [address] [place], [place] [address]   Urine culture     Status: Abnormal   Collection Time: 01/24/19 10:33 AM   Specimen: Urine, Random  Result Value Ref Range Status   Specimen Description   Final    URINE, RANDOM Performed at [facility], [address] [place], [place] [address]    Special Requests   Final    NONE Performed at [facility], [address] [place], [place] [address]    Culture (A)  Final    80,000 COLONIES/mL GROUP B STREP(S.AGALACTIAE)ISOLATED TESTING AGAINST S. AGALACTIAE NOT ROUTINELY PERFORMED DUE TO PREDICTABILITY OF AMP/PEN/VAN SUSCEPTIBILITY. Performed at [facility], [place] [address], [place], [place] [address]    Report Status 01/25/2019 FINAL  Final  CULTURE, BLOOD (ROUTINE X 2) w Reflex to ID Panel     Status: None   Collection Time: 01/24/19  5:01 PM   Specimen: BLOOD  Result Value Ref Range Status   Specimen Description BLOOD LEFT ANTECUBITAL  Final   Special Requests   Final    BOTTLES DRAWN AEROBIC AND ANAEROBIC Blood Culture adequate volume   Culture   Final    NO GROWTH 5 DAYS Performed at [facility], [address]  [place], [place] [address]    Report Status 01/29/2019 FINAL  Final  CULTURE, BLOOD (ROUTINE X 2) w Reflex to ID Panel     Status: None   Collection Time: 01/24/19  5:01 PM   Specimen: BLOOD  Result Value Ref Range Status   Specimen Description BLOOD BLOOD RIGHT WRIST  Final   Special Requests   Final    BOTTLES DRAWN AEROBIC AND ANAEROBIC Blood Culture adequate volume   Culture   Final    NO GROWTH 5 DAYS Performed at [facility], [address] [place], [place] [address]    Report Status 01/29/2019 FINAL  Final  SARS CORONAVIRUS 2 (TAT 6-24 HRS) Nasopharyngeal Nasopharyngeal Swab     Status: None   Collection Time: 01/30/19  2:29 AM   Specimen: Nasopharyngeal Swab  Result Value Ref Range Status   SARS Coronavirus 2 NEGATIVE NEGATIVE Final    Comment: (NOTE) SARS-CoV-2 target nucleic acids are NOT DETECTED. The SARS-CoV-2 RNA is generally detectable in upper and lower respiratory specimens during the acute phase of infection. Negative results do not preclude SARS-CoV-2 infection, do not rule out co-infections with other pathogens, and should not be used as the sole basis for treatment or other patient management decisions. Negative results must be combined with clinical observations, patient history, and epidemiological information. The expected result is Negative. Fact Sheet for Patients: SugarRoll.be Fact  Sheet for Healthcare Providers: https://www.woods-mathews.com/ This test is not yet approved or cleared by the [place] FDA and  has been authorized for detection and/or diagnosis of SARS-CoV-2 by FDA under an Emergency Use Authorization (EUA). This EUA will remain  in effect (meaning this test can be used) for the duration of the COVID-19 declaration under Section 56 4(b)(1) of the Act, 21 U.S.C. section 360bbb-3(b)(1), unless the authorization is terminated or revoked sooner. Performed at [facility], [place] [address], [place], [place] [address]      Labs: BNP (last 3 results) Recent Labs    03/12/18 0844  BNP [id number]   Basic Metabolic Panel: Recent Labs  Lab 01/25/19 0547 01/25/19 0547 01/27/19 0500 01/29/19 0359 01/30/19 0021 01/30/19 0230 01/31/19 0412  NA 142  --  143 138 142  --  136  K 4.1  --  4.2 3.8 4.1  --  3.9  CL 109  --  111 107 104  --  106  CO2 26  --  24 25 21*  --  22  GLUCOSE 135*  --  124* 227* 328*  --  342*  BUN 20  --  10 18 26*  --  12  CREATININE 1.06*   < > 1.06* 1.17* 1.63* 1.39* 1.03*  CALCIUM 8.7*  --  8.5* 8.5* 9.2  --  8.0*   < > = values in this interval not displayed.   Liver Function Tests: Recent Labs  Lab 01/25/19 0547 01/27/19 0500 01/29/19 0359 01/30/19 0021  AST 31 34 19 25  ALT 70* 51* 32 35  ALKPHOS 232* 225* 171* 208*  BILITOT 0.9 0.5 0.4 0.7  PROT 7.1 7.2 6.2* 8.0  ALBUMIN 3.5 3.3* 2.9* 3.7   Recent Labs  Lab 01/25/19 0547 01/27/19 0500 01/29/19 0359 01/30/19 0021  LIPASE 60* 146* 46 63*  AMYLASE  --  103*  --   --    No results for input(s): AMMONIA in the last 168 hours. CBC: Recent Labs  Lab 01/27/19 0500 01/28/19 0647 01/30/19 0021 01/30/19 0230 01/31/19 0412  WBC 6.3  --  7.7 10.7* 10.6*  NEUTROABS 3.4  --  4.7  --  7.6  HGB 10.9*  11.0* 10.5* 12.2 10.7* 10.1*  HCT 34.5*  --  37.1 32.5* 30.4*  MCV 87.8  --  84.3 84.6 84.7  PLT 182  --  204 189 202   Cardiac Enzymes: No results for input(s): CKTOTAL, CKMB, CKMBINDEX, TROPONINI in the last 168 hours. BNP: Invalid input(s): POCBNP CBG: Recent Labs  Lab 01/30/19 2018 01/30/19 2051 01/31/19 0016 01/31/19 0501 01/31/19 0813  GLUCAP 60* 103* 354* 296* 158*   D-Dimer No results for input(s): DDIMER in the last 72 hours. Hgb A1c Recent Labs    01/30/19 0230  HGBA1C 11.0*   Lipid Profile No results for input(s): CHOL, HDL, LDLCALC, TRIG, CHOLHDL, LDLDIRECT in the last 72 hours. Thyroid function studies No results for input(s): TSH, T4TOTAL,  T3FREE, THYROIDAB in the last 72 hours.  Invalid input(s): FREET3 Anemia work up No results for input(s): VITAMINB12, FOLATE, FERRITIN, TIBC, IRON, RETICCTPCT in the last 72 hours. Urinalysis    Component Value Date/Time   COLORURINE YELLOW (A) 01/24/2019 1033   APPEARANCEUR CLOUDY (A) 01/24/2019 1033   APPEARANCEUR Hazy 08/11/2013 0729   LABSPEC 1.006 01/24/2019 1033   LABSPEC 1.030 08/11/2013 0729   PHURINE 6.0 01/24/2019 1033   GLUCOSEU >=500 (A) 01/24/2019 1033   GLUCOSEU >=500 08/11/2013 0729  HGBUR NEGATIVE 01/24/2019 [place] 01/24/2019 1033   BILIRUBINUR Negative 08/11/2013 0729   [place] 01/24/2019 1033   PROTEINUR 100 (A) 01/24/2019 1033   NITRITE NEGATIVE 01/24/2019 1033   LEUKOCYTESUR LARGE (A) 01/24/2019 1033   LEUKOCYTESUR Negative 08/11/2013 0729   Sepsis Labs Invalid input(s): PROCALCITONIN,  WBC,  LACTICIDVEN Microbiology Recent Results (from the past 240 hour(s))  Respiratory Panel by RT PCR (Flu A&B, Covid) - Nasopharyngeal Swab     Status: None   Collection Time: 01/24/19 10:33 AM   Specimen: Nasopharyngeal Swab  Result Value Ref Range Status   SARS Coronavirus 2 by RT PCR NEGATIVE NEGATIVE Final    Comment: (NOTE) SARS-CoV-2 target nucleic acids are NOT DETECTED. The SARS-CoV-2 RNA is generally detectable in upper respiratoy specimens during the acute phase of infection. The lowest concentration of SARS-CoV-2 viral copies this assay can detect is 131 copies/mL. A negative result does not preclude SARS-Cov-2 infection and should not be used as the sole basis for treatment or other patient management decisions. A negative result may occur with  improper specimen collection/handling, submission of specimen other than nasopharyngeal swab, presence of viral mutation(s) within the areas targeted by this assay, and inadequate number of viral copies (<131 copies/mL). A negative result must be combined with  clinical observations, patient history, and epidemiological information. The expected result is Negative. Fact Sheet for Patients:  PinkCheek.be Fact Sheet for Healthcare Providers:  GravelBags.it This test is not yet ap proved or cleared by the [place] FDA and  has been authorized for detection and/or diagnosis of SARS-CoV-2 by FDA under an Emergency Use Authorization (EUA). This EUA will remain  in effect (meaning this test can be used) for the duration of the COVID-19 declaration under Section 564(b)(1) of the Act, 21 U.S.C. section 360bbb-3(b)(1), unless the authorization is terminated or revoked sooner.    Influenza A by PCR NEGATIVE NEGATIVE Final   Influenza B by PCR NEGATIVE NEGATIVE Final    Comment: (NOTE) The Xpert Xpress SARS-CoV-2/FLU/RSV assay is intended as an aid in  the diagnosis of influenza from Nasopharyngeal swab specimens and  should not be used as a sole basis for treatment. Nasal washings and  aspirates are unacceptable for Xpert Xpress SARS-CoV-2/FLU/RSV  testing. Fact Sheet for Patients: PinkCheek.be Fact Sheet for Healthcare Providers: GravelBags.it This test is not yet approved or cleared by the [place] FDA and  has been authorized for detection and/or diagnosis of SARS-CoV-2 by  FDA under an Emergency Use Authorization (EUA). This EUA will remain  in effect (meaning this test can be used) for the duration of the  Covid-19 declaration under Section 564(b)(1) of the Act, 21  U.S.C. section 360bbb-3(b)(1), unless the authorization is  terminated or revoked. Performed at [facility], [address], [place], [place] [address]   Urine culture     Status: Abnormal   Collection Time: 01/24/19 10:33 AM   Specimen: Urine, Random  Result Value Ref Range Status   Specimen Description   Final    URINE, RANDOM Performed at  [facility], [address] [place], [place] [address]    Special Requests   Final    NONE Performed at [facility], [address] [place], [place] [address]    Culture (A)  Final    80,000 COLONIES/mL GROUP B STREP(S.AGALACTIAE)ISOLATED TESTING AGAINST S. AGALACTIAE NOT ROUTINELY PERFORMED DUE TO PREDICTABILITY OF AMP/PEN/VAN SUSCEPTIBILITY. Performed at [facility], [place] [address]  [place], [place] [address]    Report Status 01/25/2019 FINAL  Final  CULTURE, BLOOD (ROUTINE X 2) w Reflex to ID Panel     Status: None   Collection Time: 01/24/19  5:01 PM   Specimen: BLOOD  Result Value Ref Range Status   Specimen Description BLOOD LEFT ANTECUBITAL  Final   Special Requests   Final    BOTTLES DRAWN AEROBIC AND ANAEROBIC Blood Culture adequate volume   Culture   Final    NO GROWTH 5 DAYS Performed at [facility], [address] [place], [place] [address]    Report Status 01/29/2019 FINAL  Final  CULTURE, BLOOD (ROUTINE X 2) w Reflex to ID Panel     Status: None   Collection Time: 01/24/19  5:01 PM   Specimen: BLOOD  Result Value Ref Range Status   Specimen Description BLOOD BLOOD RIGHT WRIST  Final   Special Requests   Final    BOTTLES DRAWN AEROBIC AND ANAEROBIC Blood Culture adequate volume   Culture   Final    NO GROWTH 5 DAYS Performed at [facility], [address], [place], [place] [address]    Report Status 01/29/2019 FINAL  Final  SARS CORONAVIRUS 2 (TAT 6-24 HRS) Nasopharyngeal Nasopharyngeal Swab     Status: None   Collection Time: 01/30/19  2:29 AM   Specimen: Nasopharyngeal Swab  Result Value Ref Range Status   SARS Coronavirus 2 NEGATIVE NEGATIVE Final    Comment: (NOTE) SARS-CoV-2 target nucleic acids are NOT DETECTED. The SARS-CoV-2 RNA is generally detectable in upper and lower respiratory specimens during the acute phase of infection. Negative results do not preclude SARS-CoV-2 infection, do not rule  out co-infections with other pathogens, and should not be used as the sole basis for treatment or other patient management decisions. Negative results must be combined with clinical observations, patient history, and epidemiological information. The expected result is Negative. Fact Sheet for Patients: SugarRoll.be Fact Sheet for Healthcare Providers: https://www.woods-mathews.com/ This test is not yet approved or cleared by the [place] FDA and  has been authorized for detection and/or diagnosis of SARS-CoV-2 by FDA under an Emergency Use Authorization (EUA). This EUA will remain  in effect (meaning this test can be used) for the duration of the COVID-19 declaration under Section 56 4(b)(1) of the Act, 21 U.S.C. section 360bbb-3(b)(1), unless the authorization is terminated or revoked sooner. Performed at [facility], [place] [address] [place], [place] [address]      Time coordinating discharge: Over 30 minutes  SIGNED:   Nolberto Hanlon, MD  [facility] 01/31/2019, 11:03 AM Pager   If 7PM-7AM, please contact night-coverage [email or web address] Password [email or web address]

## 2019-01-31 NOTE — Progress Notes (Signed)
Inpatient Diabetes Program Recommendations  AACE/ADA: New Consensus Statement on Inpatient Glycemic Control (2015)  Target Ranges:  Prepandial:   less than 140 mg/dL      Peak postprandial:   less than 180 mg/dL (1-2 hours)      Critically ill patients:  140 - 180 mg/dL   Lab Results  Component Value Date   GLUCAP 158 (H) 01/31/2019   HGBA1C 11.0 (H) 01/30/2019    Review of Glycemic Control  Results for Karen [name], Karen [name] (MRN [id number]) as of 01/31/2019 09:59  Ref. Range 01/30/2019 20:18 01/30/2019 20:51 01/31/2019 00:16 01/31/2019 05:01 01/31/2019 08:13  Glucose-Capillary Latest Ref Range: 70 - 99 mg/dL 60 (L) 103 (H) 354 (H) 296 (H) 158 (H)    Diabetes history: DM2 Outpatient Diabetes medications: Novolog  70/30 15 units BID Current orders for Inpatient glycemic control: Novolog 0-15 Q4H + Novolog 2 units TID with meals  Inpatient Diabetes Program Recommendations:     -Novolog 0-15 TID with meals and 0-5 QHS (this may help with minimizing risk of hypoglycemia)  Thank you, Reche Dixon, RN, BSN Diabetes Coordinator Inpatient Diabetes Program [phone] (team pager from 8a-5p)

## 2019-01-31 NOTE — Progress Notes (Signed)
Karen [name] A and O x4. VSS. Pt tolerating diet well. No complaints of nausea or vomiting. IV removed intact, prescriptions given. Pt voices understanding of discharge instructions with no further questions. Patient discharged via wheelchair with NT  Allergies as of 01/31/2019      Reactions   Penicillins Anaphylaxis   Has patient had a PCN reaction causing immediate rash, facial/tongue/throat swelling, SOB or lightheadedness with hypotension: Yes Has patient had a PCN reaction causing severe rash involving mucus membranes or skin necrosis: No Has patient had a PCN reaction that required hospitalization No Has patient had a PCN reaction occurring within the last 10 years: No If all of the above answers are "NO", then may proceed with Cephalosporin use.   Reglan [metoclopramide] Other (See Comments)   Hypotension, shortness of breath   Percocet [oxycodone-acetaminophen] Rash      Medication List    TAKE these medications   amLODipine 10 MG tablet Commonly known as: NORVASC Take 10 mg by mouth daily.   cyclobenzaprine 5 MG tablet Commonly known as: FLEXERIL Take 1 tablet (5 mg total) by mouth 2 (two) times daily. What changed:   when to take this  reasons to take this   HYDROcodone-acetaminophen 10-325 MG tablet Commonly known as: NORCO Take 1 tablet by mouth 4 (four) times daily.   insulin aspart 100 UNIT/ML injection Commonly known as: novoLOG Inject 4 Units into the skin 3 (three) times daily with meals. What changed:   how much to take  when to take this   insulin detemir 100 unit/ml Soln Commonly known as: LEVEMIR Inject 0.06 mLs (6 Units total) into the skin at bedtime.   Insulin Pen Needle 29G X 12.7MM Misc 1 Device by Does not apply route at bedtime.   lipase/protease/amylase 36000 UNITS Cpep capsule Commonly known as: Creon Take 2 capsules (72,000 Units total) by mouth 3 (three) times daily with meals.   nystatin powder Commonly known as:  MYCOSTATIN/NYSTOP Apply topically 2 (two) times daily.   omeprazole 40 MG capsule Commonly known as: PRILOSEC Take 1 capsule (40 mg total) by mouth 2 (two) times daily for 14 days.   pregabalin 150 MG capsule Commonly known as: LYRICA Take 150 mg by mouth 2 (two) times daily.   rosuvastatin 20 MG tablet Commonly known as: CRESTOR Take 20 mg by mouth daily.       Vitals:   01/30/19 2021 01/31/19 0502  BP: (!) 130/59 140/75  Pulse: 79 79  Resp: 16 16  Temp: 97.7 F (36.5 C) 99 F (37.2 C)  SpO2: 98% 99%    Darnelle Catalan

## 2019-01-31 NOTE — Progress Notes (Signed)
Patient's glucose was 60, patient had crackers and orange juice, recheck was 103.  12:00 glucose check was 354, patient refused ordered insulin; 15 units, as she said she would drop too low.  RN informed Rufina Falco, NP of refusal.  Patient scheduled to be rechecked at 04:00am.

## 2019-02-02 ENCOUNTER — Encounter: Payer: Medicaid Other | Admitting: Physical Therapy

## 2019-02-03 ENCOUNTER — Encounter: Payer: Self-pay | Admitting: *Deleted

## 2019-02-26 ENCOUNTER — Emergency Department: Payer: Medicaid Other

## 2019-02-26 ENCOUNTER — Inpatient Hospital Stay
Admission: EM | Admit: 2019-02-26 | Discharge: 2019-03-03 | DRG: 149 | Disposition: A | Discharge status: Home Health | Payer: Medicaid Other | Attending: Internal Medicine | Admitting: Internal Medicine

## 2019-02-26 ENCOUNTER — Other Ambulatory Visit: Payer: Self-pay

## 2019-02-26 DIAGNOSIS — I639 Cerebral infarction, unspecified: Secondary | ICD-10-CM

## 2019-02-26 DIAGNOSIS — R471 Dysarthria and anarthria: Secondary | ICD-10-CM | POA: Diagnosis present

## 2019-02-26 DIAGNOSIS — H8110 Benign paroxysmal vertigo, unspecified ear: Secondary | ICD-10-CM | POA: Diagnosis present

## 2019-02-26 DIAGNOSIS — Z880 Allergy status to penicillin: Secondary | ICD-10-CM

## 2019-02-26 DIAGNOSIS — Z9049 Acquired absence of other specified parts of digestive tract: Secondary | ICD-10-CM | POA: Diagnosis not present

## 2019-02-26 DIAGNOSIS — Z794 Long term (current) use of insulin: Secondary | ICD-10-CM

## 2019-02-26 DIAGNOSIS — Z79899 Other long term (current) drug therapy: Secondary | ICD-10-CM | POA: Diagnosis not present

## 2019-02-26 DIAGNOSIS — Z87891 Personal history of nicotine dependence: Secondary | ICD-10-CM | POA: Diagnosis not present

## 2019-02-26 DIAGNOSIS — Z885 Allergy status to narcotic agent status: Secondary | ICD-10-CM

## 2019-02-26 DIAGNOSIS — I2510 Atherosclerotic heart disease of native coronary artery without angina pectoris: Secondary | ICD-10-CM | POA: Diagnosis present

## 2019-02-26 DIAGNOSIS — N179 Acute kidney failure, unspecified: Secondary | ICD-10-CM | POA: Diagnosis not present

## 2019-02-26 DIAGNOSIS — F1010 Alcohol abuse, uncomplicated: Secondary | ICD-10-CM | POA: Diagnosis present

## 2019-02-26 DIAGNOSIS — R29703 NIHSS score 3: Secondary | ICD-10-CM | POA: Diagnosis present

## 2019-02-26 DIAGNOSIS — E785 Hyperlipidemia, unspecified: Secondary | ICD-10-CM

## 2019-02-26 DIAGNOSIS — R519 Headache, unspecified: Secondary | ICD-10-CM | POA: Diagnosis present

## 2019-02-26 DIAGNOSIS — K219 Gastro-esophageal reflux disease without esophagitis: Secondary | ICD-10-CM | POA: Diagnosis present

## 2019-02-26 DIAGNOSIS — Z79891 Long term (current) use of opiate analgesic: Secondary | ICD-10-CM | POA: Diagnosis not present

## 2019-02-26 DIAGNOSIS — I672 Cerebral atherosclerosis: Secondary | ICD-10-CM | POA: Diagnosis present

## 2019-02-26 DIAGNOSIS — I6523 Occlusion and stenosis of bilateral carotid arteries: Secondary | ICD-10-CM | POA: Diagnosis present

## 2019-02-26 DIAGNOSIS — Y900 Blood alcohol level of less than 20 mg/100 ml: Secondary | ICD-10-CM | POA: Diagnosis present

## 2019-02-26 DIAGNOSIS — K860 Alcohol-induced chronic pancreatitis: Secondary | ICD-10-CM | POA: Diagnosis present

## 2019-02-26 DIAGNOSIS — E1169 Type 2 diabetes mellitus with other specified complication: Secondary | ICD-10-CM | POA: Diagnosis present

## 2019-02-26 DIAGNOSIS — I10 Essential (primary) hypertension: Secondary | ICD-10-CM | POA: Diagnosis present

## 2019-02-26 DIAGNOSIS — K5900 Constipation, unspecified: Secondary | ICD-10-CM | POA: Diagnosis not present

## 2019-02-26 DIAGNOSIS — G459 Transient cerebral ischemic attack, unspecified: Secondary | ICD-10-CM | POA: Diagnosis not present

## 2019-02-26 DIAGNOSIS — Z90710 Acquired absence of both cervix and uterus: Secondary | ICD-10-CM | POA: Diagnosis not present

## 2019-02-26 DIAGNOSIS — E119 Type 2 diabetes mellitus without complications: Secondary | ICD-10-CM

## 2019-02-26 DIAGNOSIS — E1165 Type 2 diabetes mellitus with hyperglycemia: Secondary | ICD-10-CM | POA: Diagnosis present

## 2019-02-26 DIAGNOSIS — R42 Dizziness and giddiness: Secondary | ICD-10-CM | POA: Diagnosis not present

## 2019-02-26 DIAGNOSIS — Z888 Allergy status to other drugs, medicaments and biological substances status: Secondary | ICD-10-CM

## 2019-02-26 DIAGNOSIS — Z20822 Contact with and (suspected) exposure to covid-19: Secondary | ICD-10-CM | POA: Diagnosis present

## 2019-02-26 DIAGNOSIS — I959 Hypotension, unspecified: Secondary | ICD-10-CM | POA: Diagnosis not present

## 2019-02-26 HISTORY — DX: Headache, unspecified: R51.9

## 2019-02-26 LAB — URINE DRUG SCREEN, QUALITATIVE (ARMC ONLY)
Amphetamines, Ur Screen: NOT DETECTED
Barbiturates, Ur Screen: NOT DETECTED
Benzodiazepine, Ur Scrn: NOT DETECTED
Cannabinoid 50 Ng, Ur ~~LOC~~: NOT DETECTED
Cocaine Metabolite,Ur ~~LOC~~: NOT DETECTED
MDMA (Ecstasy)Ur Screen: NOT DETECTED
Methadone Scn, Ur: NOT DETECTED
Opiate, Ur Screen: POSITIVE — AB
Phencyclidine (PCP) Ur S: NOT DETECTED
Tricyclic, Ur Screen: NOT DETECTED

## 2019-02-26 LAB — BASIC METABOLIC PANEL
Anion gap: 8 (ref 5–15)
BUN: 34 mg/dL — ABNORMAL HIGH (ref 8–23)
CO2: 27 mmol/L (ref 22–32)
Calcium: 8.9 mg/dL (ref 8.9–10.3)
Chloride: 104 mmol/L (ref 98–111)
Creatinine, Ser: 1.23 mg/dL — ABNORMAL HIGH (ref 0.44–1.00)
GFR calc Af Amer: 54 mL/min — ABNORMAL LOW (ref >60)
GFR calc non Af Amer: 47 mL/min — ABNORMAL LOW (ref >60)
Glucose, Bld: 258 mg/dL — ABNORMAL HIGH (ref 70–99)
Potassium: 4.4 mmol/L (ref 3.5–5.1)
Sodium: 139 mmol/L (ref 135–145)

## 2019-02-26 LAB — TYPE AND SCREEN
ABO/RH(D): B POS
Antibody Screen: NEGATIVE

## 2019-02-26 LAB — CBC
HCT: 36.1 % (ref 36.0–46.0)
Hemoglobin: 12.2 g/dL (ref 12.0–15.0)
MCH: 28.2 pg (ref 26.0–34.0)
MCHC: 33.8 g/dL (ref 30.0–36.0)
MCV: 83.6 fL (ref 80.0–100.0)
Platelets: 194 10*3/uL (ref 150–400)
RBC: 4.32 MIL/uL (ref 3.87–5.11)
RDW: 12.6 % (ref 11.5–15.5)
WBC: 8.6 10*3/uL (ref 4.0–10.5)
nRBC: 0 % (ref 0.0–0.2)

## 2019-02-26 LAB — URINALYSIS, COMPLETE (UACMP) WITH MICROSCOPIC
Bacteria, UA: NONE SEEN
Bilirubin Urine: NEGATIVE
Glucose, UA: >=500 mg/dL — AB
Hgb urine dipstick: NEGATIVE
Ketones, ur: NEGATIVE mg/dL
Leukocytes,Ua: NEGATIVE
Nitrite: NEGATIVE
Protein, ur: 100 mg/dL — AB
Specific Gravity, Urine: 1.01 (ref 1.005–1.030)
pH: 6 (ref 5.0–8.0)

## 2019-02-26 LAB — SARS CORONAVIRUS 2 (TAT 6-24 HRS): SARS Coronavirus 2: NEGATIVE

## 2019-02-26 LAB — PROTIME-INR
INR: 0.9 (ref 0.8–1.2)
Prothrombin Time: 11.7 seconds (ref 11.4–15.2)

## 2019-02-26 LAB — HEPATIC FUNCTION PANEL
ALT: 39 U/L (ref 0–44)
AST: 22 U/L (ref 15–41)
Albumin: 3.5 g/dL (ref 3.5–5.0)
Alkaline Phosphatase: 293 U/L — ABNORMAL HIGH (ref 38–126)
Bilirubin, Direct: <0.1 mg/dL (ref 0.0–0.2)
Total Bilirubin: 0.8 mg/dL (ref 0.3–1.2)
Total Protein: 7 g/dL (ref 6.5–8.1)

## 2019-02-26 LAB — GLUCOSE, CAPILLARY: Glucose-Capillary: 261 mg/dL — ABNORMAL HIGH (ref 70–99)

## 2019-02-26 LAB — APTT: aPTT: 38 seconds — ABNORMAL HIGH (ref 24–36)

## 2019-02-26 LAB — TSH: TSH: 1.96 u[IU]/mL (ref 0.350–4.500)

## 2019-02-26 LAB — LIPASE, BLOOD: Lipase: 55 U/L — ABNORMAL HIGH (ref 11–51)

## 2019-02-26 LAB — FIBRIN DERIVATIVES D-DIMER (ARMC ONLY): Fibrin derivatives D-dimer (ARMC): 413.84 ng/mL (FEU) (ref 0.00–499.00)

## 2019-02-26 LAB — T4, FREE: Free T4: 0.88 ng/dL (ref 0.61–1.12)

## 2019-02-26 MED ORDER — PANTOPRAZOLE SODIUM 40 MG IV SOLR
40.0000 mg | INTRAVENOUS | Status: DC
  Administered 2019-02-26: 18:00:00 40 mg via INTRAVENOUS
  Filled 2019-02-26: qty 40

## 2019-02-26 MED ORDER — PREGABALIN 75 MG PO CAPS
150.0000 mg | ORAL_CAPSULE | Freq: Two times a day (BID) | ORAL | Status: DC
  Administered 2019-02-26 – 2019-03-03 (×10): 150 mg via ORAL
  Filled 2019-02-26 (×10): qty 2

## 2019-02-26 MED ORDER — ASPIRIN 81 MG PO CHEW
324.0000 mg | CHEWABLE_TABLET | Freq: Once | ORAL | Status: AC
  Administered 2019-02-26: 324 mg via ORAL
  Filled 2019-02-26: qty 4

## 2019-02-26 MED ORDER — STROKE: EARLY STAGES OF RECOVERY BOOK: Freq: Once | Status: AC

## 2019-02-26 MED ORDER — HYDROCODONE-ACETAMINOPHEN 5-325 MG PO TABS
1.0000 | ORAL_TABLET | Freq: Once | ORAL | Status: AC
  Administered 2019-02-26: 1 via ORAL
  Filled 2019-02-26: qty 1

## 2019-02-26 MED ORDER — HYDROCODONE-ACETAMINOPHEN 10-325 MG PO TABS
1.0000 | ORAL_TABLET | Freq: Four times a day (QID) | ORAL | Status: DC
  Administered 2019-02-26 – 2019-03-03 (×19): 1 via ORAL
  Filled 2019-02-26 (×19): qty 1

## 2019-02-26 MED ORDER — PANCRELIPASE (LIP-PROT-AMYL) 12000-38000 UNITS PO CPEP
72000.0000 [IU] | ORAL_CAPSULE | Freq: Three times a day (TID) | ORAL | Status: DC
  Administered 2019-02-26 – 2019-03-03 (×15): 72000 [IU] via ORAL
  Filled 2019-02-26 (×16): qty 6

## 2019-02-26 MED ORDER — ACETAMINOPHEN 500 MG PO TABS
1000.0000 mg | ORAL_TABLET | Freq: Once | ORAL | Status: DC
  Filled 2019-02-26: qty 2

## 2019-02-26 MED ORDER — SODIUM CHLORIDE 0.9 % IV SOLN: INTRAVENOUS | Status: DC

## 2019-02-26 MED ORDER — CLOPIDOGREL BISULFATE 75 MG PO TABS
75.0000 mg | ORAL_TABLET | Freq: Every day | ORAL | Status: DC
  Administered 2019-02-26 – 2019-02-27 (×2): 75 mg via ORAL
  Filled 2019-02-26 (×3): qty 1

## 2019-02-26 MED ORDER — ROSUVASTATIN CALCIUM 20 MG PO TABS
20.0000 mg | ORAL_TABLET | Freq: Every day | ORAL | Status: DC
  Administered 2019-02-27 – 2019-03-03 (×5): 20 mg via ORAL
  Filled 2019-02-26 (×2): qty 1
  Filled 2019-02-26: qty 2
  Filled 2019-02-26: qty 1
  Filled 2019-02-26 (×2): qty 2
  Filled 2019-02-26: qty 1
  Filled 2019-02-26 (×2): qty 2
  Filled 2019-02-26: qty 1

## 2019-02-26 MED ORDER — HEPARIN SODIUM (PORCINE) 5000 UNIT/ML IJ SOLN
5000.0000 [IU] | Freq: Three times a day (TID) | INTRAMUSCULAR | Status: DC
  Administered 2019-02-26 – 2019-03-03 (×14): 5000 [IU] via SUBCUTANEOUS
  Filled 2019-02-26 (×14): qty 1

## 2019-02-26 MED ORDER — GADOBUTROL 1 MMOL/ML IV SOLN
5.0000 mL | Freq: Once | INTRAVENOUS | Status: AC | PRN
  Administered 2019-02-26: 16:00:00 5 mL via INTRAVENOUS

## 2019-02-26 NOTE — ED Notes (Signed)
Pt requested this RN call friend Barth Kirks to let him know she would be admitted.  This RN spoke with Belenda Cruise and informed him that patient would be admitted to hospital later this afternoon.

## 2019-02-26 NOTE — ED Notes (Signed)
Pt to MRI

## 2019-02-26 NOTE — ED Notes (Signed)
Pt given warm blanket.

## 2019-02-26 NOTE — ED Provider Notes (Signed)
[facility] Emergency Department Provider Note  ____________________________________________  Time seen: Approximately 2:49 PM  I have reviewed the triage vital signs and the nursing notes.   HISTORY  Chief Complaint Headache    HPI Karen [name] is a 64 y.o. female with a history of diabetes, hypertension, alcoholic pancreatitis who comes to the ED due to bilateral headache and dizziness that she first noticed this morning when waking up.  Also feels off balance and having difficulty speaking.  Also feels weak on the right side.  No vision change.  Symptoms are constant, no aggravating or alleviating factors.  No recent head trauma fever or neck pain.     Past Medical History:  Diagnosis Date  . Alcoholic pancreatitis   . Asthma   . Depression   . Diabetes mellitus without complication ([place])   . DKA (diabetic ketoacidoses) ([place]) 02/17/2015  . Emphysematous cystitis   . Hypercholesteremia   . Hypertension   . Hypokalemia      Patient Active Problem List   Diagnosis Date Noted  . Hyperglycemia due to type 2 diabetes mellitus ([place]) 01/30/2019  . Acute pancreatitis 01/30/2019  . Constipation   . Acute on chronic pancreatitis ([place]) 01/25/2019  . Acute pain of left shoulder   . Adhesive capsulitis of left shoulder   . Abnormal LFTs 01/24/2019  . Cough 01/24/2019  . Hypertension   . Hypercholesteremia   . Type 2 diabetes mellitus with hyperlipidemia ([place])   . GERD (gastroesophageal reflux disease)   . Acute renal failure superimposed on stage 3a chronic kidney disease ([place])   . Pancreatitis, recurrent   . Chronic pancreatitis ([place]) 09/25/2018  . 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 Coffee ground emesis   . Upper GI bleed 11/11/2017  . Pancreatitis, acute 01/03/2017  . ETOH abuse 01/03/2017  . UTI (urinary tract infection) 01/03/2017  . Nausea and vomiting   . Esophageal candidiasis ([place])   . Intractable vomiting with nausea   .  Hypertensive urgency 04/21/2016  . Abdominal pain   . Nausea vomiting and diarrhea   . Smoker   . Poorly controlled type 2 diabetes mellitus ([place])   . Acute renal insufficiency 10/29/2015  . Elevated troponin 10/29/2015  . Urinary tract infection without hematuria 10/29/2015  . Left flank pain 10/29/2015  . Malignant essential hypertension 10/29/2015  . DKA (diabetic ketoacidoses) ([place]) 02/17/2015  . Emphysematous cystitis      Past Surgical History:  Procedure Laterality Date  . ABDOMINAL HYSTERECTOMY  1996  . APPENDECTOMY  1997  . ESOPHAGOGASTRODUODENOSCOPY N/A 11/12/2017   Procedure: ESOPHAGOGASTRODUODENOSCOPY (EGD);  Surgeon: Lin Landsman, MD;  Location[facility] ENDOSCOPY;  Service: Gastroenterology;  Laterality: N/A;  . ESOPHAGOGASTRODUODENOSCOPY N/A 09/29/2018   Procedure: ESOPHAGOGASTRODUODENOSCOPY (EGD);  Surgeon: Toledo, Benay Pike, MD;  Location: [facility] ENDOSCOPY;  Service: Gastroenterology;  Laterality: N/A;  . ESOPHAGOGASTRODUODENOSCOPY (EGD) WITH PROPOFOL N/A 04/24/2016   Procedure: ESOPHAGOGASTRODUODENOSCOPY (EGD) WITH PROPOFOL;  Surgeon: [name], MD;  Location: [facility] ENDOSCOPY;  Service: Endoscopy;  Laterality: N/A;  . ESOPHAGOGASTRODUODENOSCOPY (EGD) WITH PROPOFOL N/A 01/28/2019   Procedure: ESOPHAGOGASTRODUODENOSCOPY (EGD) WITH PROPOFOL;  Surgeon: Toledo, Benay Pike, MD;  Location: [facility] ENDOSCOPY;  Service: Gastroenterology;  Laterality: N/A;  . HAND SURGERY  1988  . THYROID SURGERY  2013     Prior to Admission medications   Medication Sig Start Date End Date Taking? Authorizing Provider  amLODipine (NORVASC) 10 MG tablet Take 10 mg by mouth daily.   Yes [provider]  HYDROcodone-acetaminophen (NORCO) 10-325 MG tablet Take 1 tablet by mouth 4 (four) times daily.    Yes [provider]  insulin aspart (NOVOLOG) 100 UNIT/ML injection Inject 4 Units into the skin 3 (three) times daily with meals. Patient taking differently: Inject 15 Units into  the skin 2 (two) times daily.  01/29/19  Yes Wieting, Richard, MD  lipase/protease/amylase (CREON) 36000 UNITS CPEP capsule Take 2 capsules (72,000 Units total) by mouth 3 (three) times daily with meals. 01/29/19  Yes Wieting, Richard, MD  nystatin (MYCOSTATIN/NYSTOP) powder Apply topically 2 (two) times daily. 01/29/19  Yes Wieting, Richard, MD  omeprazole (PRILOSEC) 40 MG capsule Take 1 capsule (40 mg total) by mouth 2 (two) times daily for 14 days. 09/22/18 02/26/19 Yes Merlyn Lot, MD  pregabalin (LYRICA) 150 MG capsule Take 150 mg by mouth 2 (two) times daily.   Yes [provider]  rosuvastatin (CRESTOR) 20 MG tablet Take 20 mg by mouth daily.   Yes [provider]  GLUCERNA (GLUCERNA) LIQD Take 237 mLs by mouth 3 (three) times daily between meals. 02/19/15 02/27/15  Bettey Costa, MD     Allergies Penicillins, Reglan [metoclopramide], and Percocet [oxycodone-acetaminophen]   Family History  Family history unknown: Yes    Social History Social History   Tobacco Use  . Smoking status: Former Research scientist (life sciences)  . Smokeless tobacco: Never Used  . Tobacco comment: quit 7 years ago   Substance Use Topics  . Alcohol use: Not Currently    Comment: quit drinking 2 years ago   . Drug use: No    Review of Systems  Constitutional:   No fever or chills.  ENT:   No sore throat. No rhinorrhea. Cardiovascular:   No chest pain or syncope. Respiratory:   No dyspnea or cough. Gastrointestinal:   Negative for abdominal pain, vomiting and diarrhea.  Musculoskeletal:   Negative for focal pain or swelling All other systems reviewed and are negative except as documented above in ROS and HPI.  ____________________________________________   PHYSICAL EXAM:  VITAL SIGNS: ED Triage Vitals  Enc Vitals Group     BP 02/26/19 1138 (!) 147/73     Pulse Rate 02/26/19 1138 77     Resp 02/26/19 1138 20     Temp 02/26/19 1138 98.1 F (36.7 C)     Temp Source 02/26/19 1138 Oral     SpO2  02/26/19 1139 98 %     Weight 02/26/19 1140 115 lb (52.2 kg)     Height 02/26/19 1140 5\' 1"  (1.549 m)     Head Circumference --      Peak Flow --      Pain Score 02/26/19 1140 10     Pain Loc --      Pain Edu? --      Excl. in [place]? --     Vital signs reviewed, nursing assessments reviewed.   Constitutional:   Alert and oriented. Non-toxic appearance. Eyes:   Conjunctivae are normal. EOMI. PERRL. ENT      Head:   Normocephalic and atraumatic.      Nose:   Normal      Mouth/Throat:   Unremarkable      Neck:   No meningismus. Full ROM. Hematological/Lymphatic/Immunilogical:   No cervical lymphadenopathy. Cardiovascular:   RRR. Symmetric bilateral radial and DP pulses.  No murmurs. Cap refill less than 2 seconds. Respiratory:   Normal respiratory effort without tachypnea/retractions. Breath sounds are clear and equal bilaterally. No wheezes/rales/rhonchi. Gastrointestinal:   Soft  and nontender. Non distended. There is no CVA tenderness.  No rebound, rigidity, or guarding. Musculoskeletal:   Normal range of motion in all extremities. No joint effusions.  No lower extremity tenderness.  No edema. Neurologic:   Dysarthric speech Mild right leg drift Mild facial asymmetry NIH stroke scale 3  Skin:    Skin is warm, dry and intact. No rash noted.  No petechiae, purpura, or bullae.  ____________________________________________    LABS (pertinent positives/negatives) (all labs ordered are listed, but only abnormal results are displayed) Labs Reviewed  GLUCOSE, CAPILLARY - Abnormal; Notable for the following components:      Result Value   Glucose-Capillary 261 (*)    All other components within normal limits  BASIC METABOLIC PANEL - Abnormal; Notable for the following components:   Glucose, Bld 258 (*)    BUN 34 (*)    Creatinine, Ser 1.23 (*)    GFR calc non Af Amer 47 (*)    GFR calc Af Amer 54 (*)    All other components within normal limits  URINALYSIS, COMPLETE (UACMP) WITH  MICROSCOPIC - Abnormal; Notable for the following components:   Color, Urine STRAW (*)    APPearance CLEAR (*)    Glucose, UA >=500 (*)    Protein, ur 100 (*)    All other components within normal limits  ETHANOL - Abnormal; Notable for the following components:   Alcohol, Ethyl (B) 15 (*)    All other components within normal limits  APTT - Abnormal; Notable for the following components:   aPTT 38 (*)    All other components within normal limits  URINE DRUG SCREEN, QUALITATIVE ([facility] ONLY) - Abnormal; Notable for the following components:   Opiate, Ur Screen POSITIVE (*)    All other components within normal limits  SARS CORONAVIRUS 2 (TAT 6-24 HRS)  CBC  PROTIME-INR  CBG MONITORING, ED   ____________________________________________   EKG  Interpreted by me  Date: 02/26/2019  Rate: 83  Rhythm: normal sinus rhythm  QRS Axis: normal  Intervals: normal  ST/T Wave abnormalities: normal  Conduction Disutrbances: none  Narrative Interpretation: unremarkable      ____________________________________________    RADIOLOGY  CT HEAD WO CONTRAST  Result Date: 02/26/2019 CLINICAL DATA:  Headache, dizziness EXAM: CT HEAD WITHOUT CONTRAST TECHNIQUE: Contiguous axial images were obtained from the base of the skull through the vertex without intravenous contrast. COMPARISON:  01/24/2019 FINDINGS: Brain: No evidence of acute infarction, hemorrhage, hydrocephalus, extra-axial collection or mass lesion/mass effect. Vascular: Intracranial atherosclerosis. Skull: Normal. Negative for fracture or focal lesion. Sinuses/Orbits: The visualized paranasal sinuses are essentially clear. The mastoid air cells are unopacified. Other: None. IMPRESSION: Normal head CT. Electronically Signed   By: Julian Hy M.D.   On: 02/26/2019 13:04    ____________________________________________   PROCEDURES Procedures  ____________________________________________  DIFFERENTIAL DIAGNOSIS    Intracranial hemorrhage, ischemic stroke, migraine, vertigo  CLINICAL IMPRESSION / ASSESSMENT AND PLAN / ED COURSE  Medications ordered in the ED: Medications  HYDROcodone-acetaminophen (NORCO/VICODIN) 5-325 MG per tablet 1 tablet (1 tablet Oral Given 02/26/19 1452)  aspirin chewable tablet 324 mg (324 mg Oral Given 02/26/19 1452)    Pertinent labs & imaging results that were available during my care of the patient were reviewed by me and considered in my medical decision making (see chart for details).  ENGRACIA PLEASANT was evaluated in Emergency Department on 02/26/2019 for the symptoms described in the history of present illness. She was evaluated in the context  of the global COVID-19 pandemic, which necessitated consideration that the patient might be at risk for infection with the SARS-CoV-2 virus that causes COVID-19. Institutional protocols and algorithms that pertain to the evaluation of patients at risk for COVID-19 are in a state of rapid change based on information released by regulatory bodies including the CDC and federal and state organizations. These policies and algorithms were followed during the patient's care in the ED.   Patient presents with onset of headache, dysarthria, facial symmetry, mild right-sided weakness, concerning for ischemic stroke.  She is outside of TPA window, LVO screen negative.  CT head unremarkable.  Will give Norco for the headache at patient's request.  Aspirin.  Plan to hospitalize for further stroke work-up given her comorbidities.      ____________________________________________   FINAL CLINICAL IMPRESSION(S) / ED DIAGNOSES    Final diagnoses:  Acute nonintractable headache, unspecified headache type  Ischemic stroke ([place])  Type 2 diabetes mellitus without complication, with long-term current use of insulin [facility])     ED Discharge Orders    None      Portions of this note were generated with dragon dictation software. Dictation errors  may occur despite best attempts at proofreading.   Carrie Mew, MD 02/26/19 [phone]

## 2019-02-26 NOTE — Assessment & Plan Note (Signed)
home regimen with insulin held. A1c/ssi/accuchecks Carb consistent diet once swallow eval done.

## 2019-02-26 NOTE — ED Notes (Signed)
Pt dressed in gown for MRI.  Pt has no jewelry or metal on per pt.

## 2019-02-26 NOTE — ED Notes (Signed)
Labs called again to add on ethanol, PT-INR, and APTT, spoke with Gywnn.

## 2019-02-26 NOTE — Assessment & Plan Note (Addendum)
D/D BPV/ Labyrynthitis/ TIA> Prn dilaudid.

## 2019-02-26 NOTE — Assessment & Plan Note (Addendum)
Suspect TIA, will start pt on Plavix as she is off aspirin due to GI side effects.  Will give O/N meclizine two dose to see if dizziness improves. Consider inner ear MRI dizziness improves with meclizine. Fall /Aspiration/seizure precaution.

## 2019-02-26 NOTE — Assessment & Plan Note (Signed)
We will  Hold amlodipine to allow for permissive htn . Dilaudid for headache.

## 2019-02-26 NOTE — ED Notes (Signed)
Pt stated she could not take Tylenol. Per the pt her doctor told her it upsets her stomach. EDP made aware.

## 2019-02-26 NOTE — ED Notes (Signed)
EDP reviewed home meds and allergies.  Tylenol offered to pt, but she refused. EDP made aware.

## 2019-02-26 NOTE — Assessment & Plan Note (Signed)
Pt has been off alcohol for 4 years.

## 2019-02-26 NOTE — ED Triage Notes (Signed)
Pt arrives with EMS for bad headache and dizziness that started this morning upon waking. Pt has diabetes. Pain 10/10.

## 2019-02-26 NOTE — ED Notes (Signed)
Pt requesting pain medication for her headache, informed Dr. Joni Fears. Lights dimmed in room for comfort.

## 2019-02-26 NOTE — H&P (Signed)
History and Physical    Karen [name] TZG:[id number] DOB: [birth year] DOA: 02/26/2019   PCP: Lavera Guise, MD   Patient coming from: Home  Chief Complaint: Headache  HPI: Karen [name] is a 64 y.o. female with medical history significant of diabetes, hypertension, alcoholic pancreatitis who comes to the ED due to bilateral headache and dizziness that she first noticed this morning when waking up.  Also feels off balance and having difficulty speaking.  Also feels weak on the right side.  No vision change.  Pt reports dizziness on getting up.otherwise no complaints of chest pain or sob.  ED Course:  Blood pressure (!) 154/79, pulse 76, temperature 98.5 F (36.9 C), temperature source Oral, resp. rate 20, height 5' 1"  (1.549 m), weight 52.2 kg, SpO2 97 %.  Pt is alert,awake and oriented. Initial labs show elevated glucose , creatinine of 1.23 with egfr of 47,normal cbc.  Initial covid-19 pending. Initial head CT is negative.  Serum alcohol level is more than 15.  Review of Systems: As per HPI otherwise 10 point review of systems negative.   Past Medical History:  Diagnosis Date  . Alcoholic pancreatitis   . Asthma   . Depression   . Diabetes mellitus without complication ([place])   . DKA (diabetic ketoacidoses) ([place]) 02/17/2015  . Emphysematous cystitis   . Hypercholesteremia   . Hypertension   . Hypokalemia     Past Surgical History:  Procedure Laterality Date  . ABDOMINAL HYSTERECTOMY  1996  . APPENDECTOMY  1997  . ESOPHAGOGASTRODUODENOSCOPY N/A 11/12/2017   Procedure: ESOPHAGOGASTRODUODENOSCOPY (EGD);  Surgeon: Lin Landsman, MD;  Location: [facility] ENDOSCOPY;  Service: Gastroenterology;  Laterality: N/A;  . ESOPHAGOGASTRODUODENOSCOPY N/A 09/29/2018   Procedure: ESOPHAGOGASTRODUODENOSCOPY (EGD);  Surgeon: Toledo, Benay Pike, MD;  Location: [facility] ENDOSCOPY;  Service: Gastroenterology;  Laterality: N/A;  . ESOPHAGOGASTRODUODENOSCOPY (EGD) WITH PROPOFOL N/A 04/24/2016   Procedure:  ESOPHAGOGASTRODUODENOSCOPY (EGD) WITH PROPOFOL;  Surgeon: [name], MD;  Location: [facility] ENDOSCOPY;  Service: Endoscopy;  Laterality: N/A;  . ESOPHAGOGASTRODUODENOSCOPY (EGD) WITH PROPOFOL N/A 01/28/2019   Procedure: ESOPHAGOGASTRODUODENOSCOPY (EGD) WITH PROPOFOL;  Surgeon: Toledo, Benay Pike, MD;  Location: [facility] ENDOSCOPY;  Service: Gastroenterology;  Laterality: N/A;  . HAND SURGERY  1988  . THYROID SURGERY  2013     reports that she has quit smoking. She has never used smokeless tobacco. She reports previous alcohol use. She reports that she does not use drugs.  Allergies  Allergen Reactions  . Penicillins Anaphylaxis    Has patient had a PCN reaction causing immediate rash, facial/tongue/throat swelling, SOB or lightheadedness with hypotension: Yes Has patient had a PCN reaction causing severe rash involving mucus membranes or skin necrosis: No Has patient had a PCN reaction that required hospitalization No Has patient had a PCN reaction occurring within the last 10 years: No If all of the above answers are "NO", then may proceed with Cephalosporin use.  . Reglan [Metoclopramide] Other (See Comments)    Hypotension, shortness of breath  . Percocet [Oxycodone-Acetaminophen] Rash    Family History  Family history unknown: Yes    Prior to Admission medications   Medication Sig Start Date End Date Taking? Authorizing Provider  amLODipine (NORVASC) 10 MG tablet Take 10 mg by mouth daily.   Yes [provider]  HYDROcodone-acetaminophen (NORCO) 10-325 MG tablet Take 1 tablet by mouth 4 (four) times daily.    Yes [provider]  insulin aspart (NOVOLOG) 100 UNIT/ML injection Inject 4 Units into the skin 3 (  three) times daily with meals. Patient taking differently: Inject 15 Units into the skin 2 (two) times daily.  01/29/19  Yes Wieting, Richard, MD  lipase/protease/amylase (CREON) 36000 UNITS CPEP capsule Take 2 capsules (72,000 Units total) by mouth 3 (three) times  daily with meals. 01/29/19  Yes Wieting, Richard, MD  nystatin (MYCOSTATIN/NYSTOP) powder Apply topically 2 (two) times daily. 01/29/19  Yes Wieting, Richard, MD  omeprazole (PRILOSEC) 40 MG capsule Take 1 capsule (40 mg total) by mouth 2 (two) times daily for 14 days. 09/22/18 02/26/19 Yes Merlyn Lot, MD  pregabalin (LYRICA) 150 MG capsule Take 150 mg by mouth 2 (two) times daily.   Yes [provider]  rosuvastatin (CRESTOR) 20 MG tablet Take 20 mg by mouth daily.   Yes [provider]  GLUCERNA (GLUCERNA) LIQD Take 237 mLs by mouth 3 (three) times daily between meals. 02/19/15 02/27/15  Bettey Costa, MD    Physical Exam: Vitals:   02/26/19 1139 02/26/19 1140 02/26/19 1454 02/26/19 1743  BP:   133/64 (!) 154/79  Pulse:   85 76  Resp:   20   Temp:   98.3 F (36.8 C) 98.5 F (36.9 C)  TempSrc:   Oral Oral  SpO2: 98%  97% 97%  Weight:  52.2 kg    Height:  5' 1"  (1.549 m)        Constitutional: NAD, calm, comfortable Vitals:   02/26/19 1139 02/26/19 1140 02/26/19 1454 02/26/19 1743  BP:   133/64 (!) 154/79  Pulse:   85 76  Resp:   20   Temp:   98.3 F (36.8 C) 98.5 F (36.9 C)  TempSrc:   Oral Oral  SpO2: 98%  97% 97%  Weight:  52.2 kg    Height:  5' 1"  (1.549 m)     Eyes: PERRL, lids and conjunctivae normal no nystagmus. ENMT: Mucous membranes are moist. Posterior pharynx clear of any exudate or lesions.Normal dentition.  Neck: normal, supple, no masses, no thyromegaly Respiratory: clear to auscultation bilaterally, no wheezing, no crackles. Normal respiratory effort. No accessory muscle use.  Cardiovascular: Regular rate and rhythm, no murmurs / rubs / gallops. No extremity edema. 2+ pedal pulses. No carotid bruits.  Abdomen: no tenderness, no masses palpated. No hepatosplenomegaly. Bowel sounds positive.  Musculoskeletal: no clubbing / cyanosis. No joint deformity upper and lower extremities. Good ROM, no contractures. Normal muscle tone.  Skin: no  rashes, lesions, ulcers. No induration Neurologic: CN 2-12 grossly intact. Sensation intact, DTR normal. Strength 5/5 in all 4.  Psychiatric: Normal judgment and insight. Alert and oriented x 3. Normal mood.   Labs on Admission: I have personally reviewed following labs and imaging studies  CBC: Recent Labs  Lab 02/26/19 1151  WBC 8.6  HGB 12.2  HCT 36.1  MCV 83.6  PLT 264   Basic Metabolic Panel: Recent Labs  Lab 02/26/19 1151  NA 139  K 4.4  CL 104  CO2 27  GLUCOSE 258*  BUN 34*  CREATININE 1.23*  CALCIUM 8.9   GFR: Estimated Creatinine Clearance: 35.8 mL/min (A) (by C-G formula based on SCr of 1.23 mg/dL (H)). Liver Function Tests: No results for input(s): AST, ALT, ALKPHOS, BILITOT, PROT, ALBUMIN in the last 168 hours. No results for input(s): LIPASE, AMYLASE in the last 168 hours. No results for input(s): AMMONIA in the last 168 hours. Coagulation Profile: Recent Labs  Lab 02/26/19 1151  INR 0.9   Cardiac Enzymes: No results for input(s): CKTOTAL, CKMB, CKMBINDEX, TROPONINI  in the last 168 hours. BNP (last 3 results) No results for input(s): PROBNP in the last 8760 hours. HbA1C: No results for input(s): HGBA1C in the last 72 hours. CBG: Recent Labs  Lab 02/26/19 1144  GLUCAP 261*   Lipid Profile: No results for input(s): CHOL, HDL, LDLCALC, TRIG, CHOLHDL, LDLDIRECT in the last 72 hours. Thyroid Function Tests: No results for input(s): TSH, T4TOTAL, FREET4, T3FREE, THYROIDAB in the last 72 hours. Anemia Panel: No results for input(s): VITAMINB12, FOLATE, FERRITIN, TIBC, IRON, RETICCTPCT in the last 72 hours. Urine analysis:    Component Value Date/Time   COLORURINE STRAW (A) 02/26/2019 1423   APPEARANCEUR CLEAR (A) 02/26/2019 1423   APPEARANCEUR Hazy 08/11/2013 0729   LABSPEC 1.010 02/26/2019 1423   LABSPEC 1.030 08/11/2013 0729   PHURINE 6.0 02/26/2019 1423   GLUCOSEU >=500 (A) 02/26/2019 1423   GLUCOSEU >=500 08/11/2013 0729   HGBUR NEGATIVE  02/26/2019 1423   BILIRUBINUR NEGATIVE 02/26/2019 1423   BILIRUBINUR Negative 08/11/2013 0729   KETONESUR NEGATIVE 02/26/2019 1423   PROTEINUR 100 (A) 02/26/2019 1423   NITRITE NEGATIVE 02/26/2019 1423   LEUKOCYTESUR NEGATIVE 02/26/2019 1423   LEUKOCYTESUR Negative 08/11/2013 0729    Radiological Exams on Admission: CT HEAD WO CONTRAST  Result Date: 02/26/2019 CLINICAL DATA:  Headache, dizziness EXAM: CT HEAD WITHOUT CONTRAST TECHNIQUE: Contiguous axial images were obtained from the base of the skull through the vertex without intravenous contrast. COMPARISON:  01/24/2019 FINDINGS: Brain: No evidence of acute infarction, hemorrhage, hydrocephalus, extra-axial collection or mass lesion/mass effect. Vascular: Intracranial atherosclerosis. Skull: Normal. Negative for fracture or focal lesion. Sinuses/Orbits: The visualized paranasal sinuses are essentially clear. The mastoid air cells are unopacified. Other: None. IMPRESSION: Normal head CT. Electronically Signed   By: Julian Hy M.D.   On: 02/26/2019 13:04   MR ANGIO HEAD WO CONTRAST  Result Date: 02/26/2019 CLINICAL DATA:  64 year old female with headache and dizziness. EXAM: MRA HEAD WITHOUT CONTRAST TECHNIQUE: Angiographic images of the Circle of Willis were obtained using MRA technique without intravenous contrast. COMPARISON:  Brain MRI and neck MRA today.  Neck CT 12/19/2018. FINDINGS: Antegrade flow in the posterior circulation. Irregularity of the right vertebral artery V4 segment corresponds to calcified plaque seen on the CT in December. However, only mild stenosis is suggested by MRA. The right PICA origin remains patent. The more dominant appearing left V4 segment appears normal to the vertebrobasilar junction. Left PICA origin is patent. Patent basilar artery. Patent SCA and PCA origins. Posterior communicating arteries are diminutive or absent. Right PCA branches are within normal limits. There is mild to moderate left PCA P2  segment irregularity and stenosis on series 1045 image 5. Symmetric distal PCA flow signal. Antegrade flow in both ICA siphons. The siphons were mostly not included in December. There is moderate to severe signal loss at the left anterior genu (series 1029, image 13). And similar contralateral right supraclinoid segment irregularity and stenosis (same image). Both carotid termini remain normal MCA patent. And ACA origins. Anterior communicating artery and visible ACA branches are within normal limits. Right MCA M1 segment and bifurcation appears patent without definite stenosis. The left MCA bifurcates early, and source images do not suggest stenosis which is questionable on the MIP images. Visible left MCA branches are within normal limits. IMPRESSION: 1. Positive for intracranial atherosclerosis with at least moderate stenosis of both ICA siphons and the left PCA P2. 2. No circle-of-Willis aneurysm or branch occlusion identified. Electronically Signed   By: Herminio Heads.D.  On: 02/26/2019 16:46   MR Angiogram Neck W or Wo Contrast  Result Date: 02/26/2019 CLINICAL DATA:  64 year old female with headache and dizziness. EXAM: MRA NECK WITHOUT AND WITH CONTRAST TECHNIQUE: Multiplanar and multiecho pulse sequences of the neck were obtained without and with intravenous contrast. Angiographic images of the neck were obtained using MRA technique without and with intravenous contrast. CONTRAST:  65m GADAVIST GADOBUTROL 1 MMOL/ML IV SOLN COMPARISON:  Neck CT 12/19/2018. Brain MRI today. Brain MRI 04/21/2016. FINDINGS: Precontrast time-of-flight images demonstrate antegrade flow in both cervical carotid and vertebral arteries to the skull base. The left vertebral appears slightly dominant. Carotid bifurcations appear within normal limits. Post-contrast neck MRA images reveal a 3 vessel arch configuration. Great vessel origins appear normal. Mildly tortuous but otherwise negative right CCA. At the posterior right ICA  origin there is mild irregularity compatible with plaque but no significant cervical right ICA stenosis. There was corresponding right ICA origin atherosclerosis on the December CT comparison. Mildly tortuous left CCA. Negative left carotid bifurcation and cervical left ICA. There was left carotid bifurcation atherosclerosis on the December CT comparison. Tortuous proximal right subclavian artery with a stenotic appearance at the thoracic inlet. And this corresponds to a combination of calcified plaque and tortuosity of the vessel with a kinked appearance on the December CT. Both vertebral artery origins appear normal. Mildly dominant left vertebral artery, but no vertebral stenosis identified to the skull base. IMPRESSION: Right subclavian and bilateral carotid bifurcation atherosclerosis which is also demonstrated on a 12/19/2018 neck CT, but negative for hemodynamically significant stenosis. Electronically Signed   By: HGenevie AnnM.D.   On: 02/26/2019 16:38   MR BRAIN WO CONTRAST  Result Date: 02/26/2019 CLINICAL DATA:  64year old female with headache and dizziness. EXAM: MRI HEAD WITHOUT CONTRAST TECHNIQUE: Multiplanar, multiecho pulse sequences of the brain and surrounding structures were obtained without intravenous contrast. COMPARISON:  Head CT earlier today. Neck CT 12/19/2018. Brain MRI 04/21/2016. FINDINGS: Brain: No restricted diffusion to suggest acute infarction. No midline shift, mass effect, evidence of mass lesion, ventriculomegaly, extra-axial collection or acute intracranial hemorrhage. Cervicomedullary junction and pituitary are within normal limits. Thick gray and white matter signal appears stable throughout the brain since 2018. There are chronic microhemorrhages in the right periatrial white matter. But otherwise mild for age nonspecific white matter T2 and FLAIR hyperintensity. No cortical encephalomalacia. Deep gray nuclei, brainstem and cerebellum remain normal for age. Vascular: Major  intracranial vascular flow voids are stable since 2018. Skull and upper cervical spine: Normal for age visible cervical spine. Visualized bone marrow signal is within normal limits. Sinuses/Orbits: Stable and negative. Other: Stable mild left mastoid effusion. Negative nasopharynx. Grossly normal visible internal auditory structures. Scalp and face soft tissues appear negative. IMPRESSION: 1. No acute intracranial abnormality. 2. Stable noncontrast MRI appearance of the brain since 2018: chronic microhemorrhages in the right hemisphere indicative of small-vessel disease although all only mild for age white matter signal changes otherwise. Electronically Signed   By: HGenevie AnnM.D.   On: 02/26/2019 16:33    EKG: Independently reviewed. SR 83.    Assessment/Plan Active Problems:   ETOH abuse   Hypertension   Type 2 diabetes mellitus with hyperlipidemia (HCC)   GERD (gastroesophageal reflux disease)   Headache   Problem Assessment/Plan Hypertension Assessment & Plan We will  Hold amlodipine to allow for permissive htn . Dilaudid for headache.  GERD (gastroesophageal reflux disease) Assessment & Plan Iv ppi therapy.    Type 2 diabetes  mellitus with hyperlipidemia ([place]) Assessment & Plan home regimen with insulin held. A1c/ssi/accuchecks Carb consistent diet once swallow eval done.    ETOH abuse Assessment & Plan Pt has been off alcohol for 4 years.  Dizziness Assessment & Plan Suspect TIA, will start pt on Plavix as she is off aspirin due to GI side effects.  Will give O/N meclizine two dose to see if dizziness improves. Consider inner ear MRI dizziness improves with meclizine. Fall /Aspiration/seizure precaution.   Headache Assessment & Plan D/D BPV/ Labyrynthitis/ TIA> Prn dilaudid.  DVT prophylaxis: Heparin  Code Status: Full Family Communication: None Disposition Plan: Home Consults called: None Admission status: Inpatient  Para Skeans MD [facility] If  7PM-7AM, please contact night-coverage [email or web address] Password [facility]  02/26/2019, 6:07 PM

## 2019-02-26 NOTE — ED Notes (Signed)
Pt assisted to bathroom with 1 assist.  Urine specimen collected.

## 2019-02-26 NOTE — Assessment & Plan Note (Signed)
Iv ppi therapy.  

## 2019-02-27 ENCOUNTER — Encounter: Payer: Self-pay | Admitting: Internal Medicine

## 2019-02-27 ENCOUNTER — Inpatient Hospital Stay (HOSPITAL_COMMUNITY)
Admit: 2019-02-27 | Discharge: 2019-02-27 | Disposition: A | Discharge status: Home / Self Care | Payer: Medicaid Other | Attending: Internal Medicine | Admitting: Internal Medicine

## 2019-02-27 ENCOUNTER — Other Ambulatory Visit: Payer: Medicaid Other

## 2019-02-27 DIAGNOSIS — F1010 Alcohol abuse, uncomplicated: Secondary | ICD-10-CM

## 2019-02-27 DIAGNOSIS — I10 Essential (primary) hypertension: Secondary | ICD-10-CM

## 2019-02-27 DIAGNOSIS — G459 Transient cerebral ischemic attack, unspecified: Secondary | ICD-10-CM

## 2019-02-27 LAB — GLUCOSE, CAPILLARY
Glucose-Capillary: 314 mg/dL — ABNORMAL HIGH (ref 70–99)
Glucose-Capillary: 387 mg/dL — ABNORMAL HIGH (ref 70–99)
Glucose-Capillary: 194 mg/dL — ABNORMAL HIGH (ref 70–99)

## 2019-02-27 LAB — ECHOCARDIOGRAM COMPLETE
Height: 61 in
Weight: 1840 oz

## 2019-02-27 LAB — CBC WITH DIFFERENTIAL/PLATELET
Abs Immature Granulocytes: 0.05 10*3/uL (ref 0.00–0.07)
Basophils Absolute: 0.1 10*3/uL (ref 0.0–0.1)
Basophils Relative: 1 %
Eosinophils Absolute: 0.3 10*3/uL (ref 0.0–0.5)
Eosinophils Relative: 4 %
HCT: 32.5 % — ABNORMAL LOW (ref 36.0–46.0)
Hemoglobin: 10.9 g/dL — ABNORMAL LOW (ref 12.0–15.0)
Immature Granulocytes: 1 %
Lymphocytes Relative: 42 %
Lymphs Abs: 3.1 10*3/uL (ref 0.7–4.0)
MCH: 28.2 pg (ref 26.0–34.0)
MCHC: 33.5 g/dL (ref 30.0–36.0)
MCV: 84.2 fL (ref 80.0–100.0)
Monocytes Absolute: 0.6 10*3/uL (ref 0.1–1.0)
Monocytes Relative: 8 %
Neutro Abs: 3.2 10*3/uL (ref 1.7–7.7)
Neutrophils Relative %: 44 %
Platelets: 192 10*3/uL (ref 150–400)
RBC: 3.86 MIL/uL — ABNORMAL LOW (ref 3.87–5.11)
RDW: 12.8 % (ref 11.5–15.5)
WBC: 7.2 10*3/uL (ref 4.0–10.5)
nRBC: 0 % (ref 0.0–0.2)

## 2019-02-27 LAB — LIPID PANEL
Cholesterol: 222 mg/dL — ABNORMAL HIGH (ref 0–200)
HDL: 44 mg/dL (ref >40)
LDL Cholesterol: 107 mg/dL — ABNORMAL HIGH (ref 0–99)
Total CHOL/HDL Ratio: 5 RATIO
Triglycerides: 355 mg/dL — ABNORMAL HIGH (ref <150)
VLDL: 71 mg/dL — ABNORMAL HIGH (ref 0–40)

## 2019-02-27 LAB — COMPREHENSIVE METABOLIC PANEL
ALT: 34 U/L (ref 0–44)
AST: 18 U/L (ref 15–41)
Albumin: 3.4 g/dL — ABNORMAL LOW (ref 3.5–5.0)
Alkaline Phosphatase: 271 U/L — ABNORMAL HIGH (ref 38–126)
Anion gap: 7 (ref 5–15)
BUN: 28 mg/dL — ABNORMAL HIGH (ref 8–23)
CO2: 26 mmol/L (ref 22–32)
Calcium: 8.7 mg/dL — ABNORMAL LOW (ref 8.9–10.3)
Chloride: 105 mmol/L (ref 98–111)
Creatinine, Ser: 1.11 mg/dL — ABNORMAL HIGH (ref 0.44–1.00)
GFR calc Af Amer: >60 mL/min (ref >60)
GFR calc non Af Amer: 53 mL/min — ABNORMAL LOW (ref >60)
Glucose, Bld: 342 mg/dL — ABNORMAL HIGH (ref 70–99)
Potassium: 4.3 mmol/L (ref 3.5–5.1)
Sodium: 138 mmol/L (ref 135–145)
Total Bilirubin: 0.5 mg/dL (ref 0.3–1.2)
Total Protein: 7.2 g/dL (ref 6.5–8.1)

## 2019-02-27 LAB — RPR: RPR Ser Ql: NONREACTIVE

## 2019-02-27 LAB — HEMOGLOBIN A1C
Hgb A1c MFr Bld: 11.1 % — ABNORMAL HIGH (ref 4.8–5.6)
Mean Plasma Glucose: 271.87 mg/dL

## 2019-02-27 MED ORDER — INSULIN ASPART 100 UNIT/ML ~~LOC~~ SOLN
0.0000 [IU] | Freq: Every day | SUBCUTANEOUS | Status: DC
  Administered 2019-02-28: 5 [IU] via SUBCUTANEOUS
  Administered 2019-03-02: 2 [IU] via SUBCUTANEOUS
  Filled 2019-02-27 (×3): qty 1

## 2019-02-27 MED ORDER — INSULIN ASPART PROT & ASPART (70-30 MIX) 100 UNIT/ML ~~LOC~~ SUSP
10.0000 [IU] | Freq: Two times a day (BID) | SUBCUTANEOUS | Status: DC
  Administered 2019-02-27 (×2): 10 [IU] via SUBCUTANEOUS
  Filled 2019-02-27 (×3): qty 10

## 2019-02-27 MED ORDER — ONDANSETRON HCL 4 MG/2ML IJ SOLN
4.0000 mg | Freq: Four times a day (QID) | INTRAMUSCULAR | Status: DC | PRN
  Administered 2019-02-28 – 2019-03-03 (×3): 4 mg via INTRAVENOUS
  Filled 2019-02-27 (×3): qty 2

## 2019-02-27 MED ORDER — HYDROCODONE-ACETAMINOPHEN 7.5-325 MG PO TABS
1.0000 | ORAL_TABLET | Freq: Once | ORAL | Status: AC
  Administered 2019-02-27: 1 via ORAL
  Filled 2019-02-27: qty 1

## 2019-02-27 MED ORDER — MECLIZINE HCL 25 MG PO TABS
25.0000 mg | ORAL_TABLET | Freq: Three times a day (TID) | ORAL | Status: AC
  Administered 2019-02-27 – 2019-02-28 (×6): 25 mg via ORAL
  Filled 2019-02-27 (×7): qty 1

## 2019-02-27 MED ORDER — DIAZEPAM 5 MG PO TABS
5.0000 mg | ORAL_TABLET | Freq: Two times a day (BID) | ORAL | Status: AC
  Administered 2019-02-27 – 2019-02-28 (×4): 5 mg via ORAL
  Filled 2019-02-27 (×4): qty 1

## 2019-02-27 MED ORDER — INSULIN ASPART 100 UNIT/ML ~~LOC~~ SOLN
0.0000 [IU] | Freq: Three times a day (TID) | SUBCUTANEOUS | Status: DC
  Administered 2019-02-27 – 2019-02-28 (×3): 9 [IU] via SUBCUTANEOUS
  Administered 2019-02-28 (×2): 3 [IU] via SUBCUTANEOUS
  Administered 2019-03-01: 2 [IU] via SUBCUTANEOUS
  Administered 2019-03-01: 9 [IU] via SUBCUTANEOUS
  Administered 2019-03-01: 5 [IU] via SUBCUTANEOUS
  Administered 2019-03-02: 3 [IU] via SUBCUTANEOUS
  Administered 2019-03-02: 2 [IU] via SUBCUTANEOUS
  Administered 2019-03-02: 5 [IU] via SUBCUTANEOUS
  Administered 2019-03-03: 3 [IU] via SUBCUTANEOUS
  Administered 2019-03-03: 2 [IU] via SUBCUTANEOUS
  Filled 2019-02-27 (×13): qty 1

## 2019-02-27 MED ORDER — PANTOPRAZOLE SODIUM 40 MG PO TBEC
40.0000 mg | DELAYED_RELEASE_TABLET | Freq: Every day | ORAL | Status: DC
  Administered 2019-02-27 – 2019-03-02 (×4): 40 mg via ORAL
  Filled 2019-02-27 (×4): qty 1

## 2019-02-27 MED ORDER — AMLODIPINE BESYLATE 10 MG PO TABS
10.0000 mg | ORAL_TABLET | Freq: Every day | ORAL | Status: DC
  Administered 2019-02-27 – 2019-03-02 (×4): 10 mg via ORAL
  Filled 2019-02-27 (×3): qty 1

## 2019-02-27 NOTE — Progress Notes (Addendum)
Progress Note    MAYNARD HOESE  [id number] DOB: 1955/10/18  DOA: 02/26/2019 PCP: Lavera Guise, MD      Brief Narrative:    Medical records reviewed and are as summarized below:  KEANNE FETTIG is an 64 y.o. female with medical history significant for hypertension, type 2 diabetes mellitus, hyperlipidemia, CAD, chronic pancreatitis, history of alcohol use disorder, who presented to the hospital with nausea, headache, dizziness and unsteady gait.      Assessment/Plan:   Active Problems:   ETOH abuse   Hypertension   Type 2 diabetes mellitus with hyperlipidemia (HCC)   GERD (gastroesophageal reflux disease)   Headache   Dizziness   Body mass index is 21.73 kg/m.   Headache and dizziness likely due to BPPV: MRI brain did not show any acute abnormality or stroke.  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 2D echo showed EF estimated at 65 to [id number], grade 1 diastolic dysfunction Start meclizine, diazepam and Zofran.  Discontinue Plavix.  Patient has no history of stroke.  PT and OT eval for vestibular training and unsteady gait  Hypertension: Resume home amlodipine.  Type II DM with hyperglycemia: Hemoglobin A1c is 11.1.  Start NovoLog Mix for glucose control and monitor glucose levels closely.  Hyperlipidemia: Uncontrolled.  Total cholesterol 222, triglycerides 355, LDL 107.  Continue Crestor  Chronic pancreatitis: Continue Creon  History of alcohol use disorder: Patient said she has not drank alcohol in 4 years.  Alcohol level was 15 which was above normal limits.     Family Communication/Anticipated D/C date and plan/Code Status   DVT prophylaxis: Lovenox Code Status: Full code Family Communication: Plan discussed with patient Disposition Plan: Possible discharge to home in 1 to 2 days      Subjective:   C/o nausea, headache and dizziness(described as "spinning").  She said she is  unable to sit up on the tanning because of severe dizziness.  No hearing impairment or tinnitus.  No fever or chills.  Objective:    Vitals:   02/27/19 0647 02/27/19 0815 02/27/19 1208 02/27/19 1516  BP: (!) 142/68 (!) 153/80 (!) 158/73 139/89  Pulse:  73 79 73  Resp: 16 17 17 17   Temp: 98 F (36.7 C) 99.1 F (37.3 C) 98.1 F (36.7 C) 98.3 F (36.8 C)  TempSrc: Oral  Oral   SpO2: 95% 96% 97% 96%  Weight:      Height:        Intake/Output Summary (Last 24 hours) at 02/27/2019 1651 Last data filed at 02/27/2019 1600 Gross per 24 hour  Intake 1809.41 ml  Output --  Net 1809.41 ml   Filed Weights   02/26/19 1140  Weight: 52.2 kg    Exam:  GEN: NAD SKIN: No rash EYES: EOMI, PERRLA, horizontal nystagmus ENT: MMM CV: RRR PULM: CTA B ABD: soft, ND, NT, +BS CNS: AAO x 3, non focal EXT: No edema or tenderness   Data Reviewed:   I have personally reviewed following labs and imaging studies:  Labs: Labs show the following:   Basic Metabolic Panel: Recent Labs  Lab 02/26/19 1151 02/27/19 0311  NA 139 138  K 4.4 4.3  CL 104 105  CO2 27 26  GLUCOSE 258* 342*  BUN 34* 28*  CREATININE 1.23* 1.11*  CALCIUM 8.9 8.7*   GFR Estimated Creatinine Clearance: 39.7 mL/min (A) (by C-G formula based on SCr of  1.11 mg/dL (H)). Liver Function Tests: Recent Labs  Lab 02/26/19 1814 02/27/19 0311  AST 22 18  ALT 39 34  ALKPHOS 293* 271*  BILITOT 0.8 0.5  PROT 7.0 7.2  ALBUMIN 3.5 3.4*   Recent Labs  Lab 02/26/19 1814  LIPASE 55*   No results for input(s): AMMONIA in the last 168 hours. Coagulation profile Recent Labs  Lab 02/26/19 1151  INR 0.9    CBC: Recent Labs  Lab 02/26/19 1151 02/27/19 0311  WBC 8.6 7.2  NEUTROABS  --  3.2  HGB 12.2 10.9*  HCT 36.1 32.5*  MCV 83.6 84.2  PLT 194 192   Cardiac Enzymes: No results for input(s): CKTOTAL, CKMB, CKMBINDEX, TROPONINI in the last 168 hours. BNP (last 3 results) No results for input(s): PROBNP in  the last 8760 hours. CBG: Recent Labs  Lab 02/26/19 1144 02/27/19 0918 02/27/19 1200  GLUCAP 261* 314* 387*   D-Dimer: No results for input(s): DDIMER in the last 72 hours. Hgb A1c: Recent Labs    02/27/19 0311  HGBA1C 11.1*   Lipid Profile: Recent Labs    02/27/19 0311  CHOL 222*  HDL 44  LDLCALC 107*  TRIG 355*  CHOLHDL 5.0   Thyroid function studies: Recent Labs    02/26/19 1814  TSH 1.960   Anemia work up: No results for input(s): VITAMINB12, FOLATE, FERRITIN, TIBC, IRON, RETICCTPCT in the last 72 hours. Sepsis Labs: Recent Labs  Lab 02/26/19 1151 02/27/19 0311  WBC 8.6 7.2    Microbiology Recent Results (from the past 240 hour(s))  SARS CORONAVIRUS 2 (TAT 6-24 HRS) Nasopharyngeal Nasopharyngeal Swab     Status: None   Collection Time: 02/26/19 12:11 PM   Specimen: Nasopharyngeal Swab  Result Value Ref Range Status   SARS Coronavirus 2 NEGATIVE NEGATIVE Final    Comment: (NOTE) SARS-CoV-2 target nucleic acids are NOT DETECTED. The SARS-CoV-2 RNA is generally detectable in upper and lower respiratory specimens during the acute phase of infection. Negative results do not preclude SARS-CoV-2 infection, do not rule out co-infections with other pathogens, and should not be used as the sole basis for treatment or other patient management decisions. Negative results must be combined with clinical observations, patient history, and epidemiological information. The expected result is Negative. Fact Sheet for Patients: SugarRoll.be Fact Sheet for Healthcare Providers: https://www.woods-mathews.com/ This test is not yet approved or cleared by the [place] FDA and  has been authorized for detection and/or diagnosis of SARS-CoV-2 by FDA under an Emergency Use Authorization (EUA). This EUA will remain  in effect (meaning this test can be used) for the duration of the COVID-19 declaration under Section 56 4(b)(1) of  the Act, 21 U.S.C. section 360bbb-3(b)(1), unless the authorization is terminated or revoked sooner. Performed at [facility], [place] [address] [place], [place] [address]     Procedures and diagnostic studies:  CT HEAD WO CONTRAST  Result Date: 02/26/2019 CLINICAL DATA:  Headache, dizziness EXAM: CT HEAD WITHOUT CONTRAST TECHNIQUE: Contiguous axial images were obtained from the base of the skull through the vertex without intravenous contrast. COMPARISON:  01/24/2019 FINDINGS: Brain: No evidence of acute infarction, hemorrhage, hydrocephalus, extra-axial collection or mass lesion/mass effect. Vascular: Intracranial atherosclerosis. Skull: Normal. Negative for fracture or focal lesion. Sinuses/Orbits: The visualized paranasal sinuses are essentially clear. The mastoid air cells are unopacified. Other: None. IMPRESSION: Normal head CT. Electronically Signed   By: Julian Hy M.D.   On: 02/26/2019 13:04   MR ANGIO HEAD WO  CONTRAST  Result Date: 02/26/2019 CLINICAL DATA:  64 year old female with headache and dizziness. EXAM: MRA HEAD WITHOUT CONTRAST TECHNIQUE: Angiographic images of the Circle of Willis were obtained using MRA technique without intravenous contrast. COMPARISON:  Brain MRI and neck MRA today.  Neck CT 12/19/2018. FINDINGS: Antegrade flow in the posterior circulation. Irregularity of the right vertebral artery V4 segment corresponds to calcified plaque seen on the CT in December. However, only mild stenosis is suggested by MRA. The right PICA origin remains patent. The more dominant appearing left V4 segment appears normal to the vertebrobasilar junction. Left PICA origin is patent. Patent basilar artery. Patent SCA and PCA origins. Posterior communicating arteries are diminutive or absent. Right PCA branches are within normal limits. There is mild to moderate left PCA P2 segment irregularity and stenosis on series 1045 image 5. Symmetric distal PCA flow signal. Antegrade flow  in both ICA siphons. The siphons were mostly not included in December. There is moderate to severe signal loss at the left anterior genu (series 1029, image 13). And similar contralateral right supraclinoid segment irregularity and stenosis (same image). Both carotid termini remain normal MCA patent. And ACA origins. Anterior communicating artery and visible ACA branches are within normal limits. Right MCA M1 segment and bifurcation appears patent without definite stenosis. The left MCA bifurcates early, and source images do not suggest stenosis which is questionable on the MIP images. Visible left MCA branches are within normal limits. IMPRESSION: 1. Positive for intracranial atherosclerosis with at least moderate stenosis of both ICA siphons and the left PCA P2. 2. No circle-of-Willis aneurysm or branch occlusion identified. Electronically Signed   By: Genevie Ann M.D.   On: 02/26/2019 16:46   MR Angiogram Neck W or Wo Contrast  Result Date: 02/26/2019 CLINICAL DATA:  64 year old female with headache and dizziness. EXAM: MRA NECK WITHOUT AND WITH CONTRAST TECHNIQUE: Multiplanar and multiecho pulse sequences of the neck were obtained without and with intravenous contrast. Angiographic images of the neck were obtained using MRA technique without and with intravenous contrast. CONTRAST:  85mL GADAVIST GADOBUTROL 1 MMOL/ML IV SOLN COMPARISON:  Neck CT 12/19/2018. Brain MRI today. Brain MRI 04/21/2016. FINDINGS: Precontrast time-of-flight images demonstrate antegrade flow in both cervical carotid and vertebral arteries to the skull base. The left vertebral appears slightly dominant. Carotid bifurcations appear within normal limits. Post-contrast neck MRA images reveal a 3 vessel arch configuration. Great vessel origins appear normal. Mildly tortuous but otherwise negative right CCA. At the posterior right ICA origin there is mild irregularity compatible with plaque but no significant cervical right ICA stenosis. There  was corresponding right ICA origin atherosclerosis on the December CT comparison. Mildly tortuous left CCA. Negative left carotid bifurcation and cervical left ICA. There was left carotid bifurcation atherosclerosis on the December CT comparison. Tortuous proximal right subclavian artery with a stenotic appearance at the thoracic inlet. And this corresponds to a combination of calcified plaque and tortuosity of the vessel with a kinked appearance on the December CT. Both vertebral artery origins appear normal. Mildly dominant left vertebral artery, but no vertebral stenosis identified to the skull base. IMPRESSION: Right subclavian and bilateral carotid bifurcation atherosclerosis which is also demonstrated on a 12/19/2018 neck CT, but negative for hemodynamically significant stenosis. Electronically Signed   By: Genevie Ann M.D.   On: 02/26/2019 16:38   MR BRAIN WO CONTRAST  Result Date: 02/26/2019 CLINICAL DATA:  64 year old female with headache and dizziness. EXAM: MRI HEAD WITHOUT CONTRAST TECHNIQUE: Multiplanar, multiecho pulse sequences  of the brain and surrounding structures were obtained without intravenous contrast. COMPARISON:  Head CT earlier today. Neck CT 12/19/2018. Brain MRI 04/21/2016. FINDINGS: Brain: No restricted diffusion to suggest acute infarction. No midline shift, mass effect, evidence of mass lesion, ventriculomegaly, extra-axial collection or acute intracranial hemorrhage. Cervicomedullary junction and pituitary are within normal limits. Thick gray and white matter signal appears stable throughout the brain since 2018. There are chronic microhemorrhages in the right periatrial white matter. But otherwise mild for age nonspecific white matter T2 and FLAIR hyperintensity. No cortical encephalomalacia. Deep gray nuclei, brainstem and cerebellum remain normal for age. Vascular: Major intracranial vascular flow voids are stable since 2018. Skull and upper cervical spine: Normal for age visible  cervical spine. Visualized bone marrow signal is within normal limits. Sinuses/Orbits: Stable and negative. Other: Stable mild left mastoid effusion. Negative nasopharynx. Grossly normal visible internal auditory structures. Scalp and face soft tissues appear negative. IMPRESSION: 1. No acute intracranial abnormality. 2. Stable noncontrast MRI appearance of the brain since 2018: chronic microhemorrhages in the right hemisphere indicative of small-vessel disease although all only mild for age white matter signal changes otherwise. Electronically Signed   By: Genevie Ann M.D.   On: 02/26/2019 16:33   ECHOCARDIOGRAM COMPLETE  Result Date: 02/27/2019    ECHOCARDIOGRAM REPORT   Patient Name:   ZARIAN FACEY Date of Exam: 02/27/2019 Medical Rec #:  [id number]    Height:       61.0 in Accession #:    [id number]   Weight:       115.0 lb Date of Birth:  June 29, 1955     BSA:          1.493 m Patient Age:    16 years     BP:           153/80 mmHg Patient Gender: F            HR:           69 bpm. Exam Location:  [facility] Procedure: 2D Echo, Color Doppler and Cardiac Doppler Indications:     G45.9 TIA  History:         Patient has prior history of Echocardiogram examinations. Risk                  Factors:Hypertension, Diabetes and HCL.  Sonographer:     Charmayne Sheer RDCS (AE) Referring Phys:  [place] Diagnosing Phys: Kathlyn Sacramento MD IMPRESSIONS  1. Left ventricular ejection fraction, by estimation, is 65 to 70%. The left ventricle has normal function. The left ventricle has no regional wall motion abnormalities. There is mild left ventricular hypertrophy. Left ventricular diastolic parameters are consistent with Grade I diastolic dysfunction (impaired relaxation).  2. Right ventricular systolic function is normal. The right ventricular size is normal. Tricuspid regurgitation signal is inadequate for assessing PA pressure.  3. The mitral valve is normal in structure and function. No evidence of mitral valve regurgitation. No  evidence of mitral stenosis.  4. The aortic valve is normal in structure and function. Aortic valve regurgitation is not visualized. No aortic stenosis is present.  5. The inferior vena cava is normal in size with greater than 50% respiratory variability, suggesting right atrial pressure of 3 mmHg. FINDINGS  Left Ventricle: Left ventricular ejection fraction, by estimation, is 65 to 70%. The left ventricle has normal function. The left ventricle has no regional wall motion abnormalities. The left ventricular internal cavity size was normal in size. There  is  mild left ventricular hypertrophy. Left ventricular diastolic parameters are consistent with Grade I diastolic dysfunction (impaired relaxation). Right Ventricle: The right ventricular size is normal. No increase in right ventricular wall thickness. Right ventricular systolic function is normal. Tricuspid regurgitation signal is inadequate for assessing PA pressure. Left Atrium: Left atrial size was normal in size. Right Atrium: Right atrial size was normal in size. Pericardium: There is no evidence of pericardial effusion. Mitral Valve: The mitral valve is normal in structure and function. Normal mobility of the mitral valve leaflets. No evidence of mitral valve regurgitation. No evidence of mitral valve stenosis. MV peak gradient, 4.8 mmHg. The mean mitral valve gradient is 2.0 mmHg. Tricuspid Valve: The tricuspid valve is normal in structure. Tricuspid valve regurgitation is not demonstrated. No evidence of tricuspid stenosis. Aortic Valve: The aortic valve is normal in structure and function. Aortic valve regurgitation is not visualized. No aortic stenosis is present. Aortic valve mean gradient measures 7.0 mmHg. Aortic valve peak gradient measures 13.7 mmHg. Aortic valve area, by VTI measures 1.26 cm. Pulmonic Valve: The pulmonic valve was normal in structure. Pulmonic valve regurgitation is not visualized. No evidence of pulmonic stenosis. Aorta: The  aortic root is normal in size and structure. Venous: The inferior vena cava is normal in size with greater than 50% respiratory variability, suggesting right atrial pressure of 3 mmHg. IAS/Shunts: No atrial level shunt detected by color flow Doppler.  LEFT VENTRICLE PLAX 2D LVIDd:         3.74 cm  Diastology LVIDs:         2.16 cm  LV e' lateral:   4.68 cm/s LV PW:         0.78 cm  LV E/e' lateral: 16.7 LV IVS:        0.62 cm  LV e' medial:    4.79 cm/s LVOT diam:     1.70 cm  LV E/e' medial:  16.3 LV SV:         44.03 ml LV SV Index:   29.50 LVOT Area:     2.27 cm  RIGHT VENTRICLE RV Basal diam:  2.86 cm LEFT ATRIUM             Index       RIGHT ATRIUM           Index LA diam:        3.00 cm 2.01 cm/m  RA Area:     10.20 cm LA Vol (A2C):   28.6 ml 19.16 ml/m RA Volume:   18.60 ml  12.46 ml/m LA Vol (A4C):   33.0 ml 22.10 ml/m LA Biplane Vol: 32.4 ml 21.70 ml/m  AORTIC VALVE                    PULMONIC VALVE AV Area (Vmax):    1.51 cm     PV Vmax:       0.90 m/s AV Area (Vmean):   1.49 cm     PV Vmean:      61.500 cm/s AV Area (VTI):     1.26 cm     PV VTI:        0.210 m AV Vmax:           185.00 cm/s  PV Peak grad:  3.2 mmHg AV Vmean:          126.000 cm/s PV Mean grad:  2.0 mmHg AV VTI:  0.351 m AV Peak Grad:      13.7 mmHg AV Mean Grad:      7.0 mmHg LVOT Vmax:         123.00 cm/s LVOT Vmean:        82.600 cm/s LVOT VTI:          0.194 m LVOT/AV VTI ratio: 0.55  AORTA Ao Root diam: 2.90 cm MITRAL VALVE MV Area (PHT): 2.99 cm     SHUNTS MV Peak grad:  4.8 mmHg     Systemic VTI:  0.19 m MV Mean grad:  2.0 mmHg     Systemic Diam: 1.70 cm MV Vmax:       1.09 m/s MV Vmean:      61.9 cm/s MV Decel Time: 254 msec MV E velocity: 78.10 cm/s MV A velocity: 101.00 cm/s MV E/A ratio:  0.77 Kathlyn Sacramento MD Electronically signed by Kathlyn Sacramento MD Signature Date/Time: 02/27/2019/1:29:44 PM    Final     Medications:   . amLODipine  10 mg Oral Daily  . diazepam  5 mg Oral BID  . heparin  5,000  Units Subcutaneous Q8H  . HYDROcodone-acetaminophen  1 tablet Oral QID  . insulin aspart  0-5 Units Subcutaneous QHS  . insulin aspart  0-9 Units Subcutaneous TID WC  . insulin aspart protamine- aspart  10 Units Subcutaneous BID WC  . lipase/protease/amylase  72,000 Units Oral TID WC  . meclizine  25 mg Oral TID  . pantoprazole  40 mg Oral Daily  . pregabalin  150 mg Oral BID  . rosuvastatin  20 mg Oral Daily   Continuous Infusions: . sodium chloride 50 mL/hr at 02/27/19 0700     LOS: 1 day   Emanii Bugbee  [facility]     02/27/2019, 4:51 PM

## 2019-02-27 NOTE — Progress Notes (Signed)
OT Cancellation Note  Patient Details Name: Karen [name] MRN: [id number] DOB: Sep 07, 1955   Cancelled Treatment:    Reason Eval/Treat Not Completed: Other (comment)  Patient approached for OT evaluation.  Patient in bed stating she wasn't feeling well and would be able to participate later.  Will follow up as able.  Oren Binet 02/27/2019, 2:07 PM

## 2019-02-27 NOTE — Evaluation (Addendum)
Physical Therapy Evaluation Patient Details Name: Karen [name] MRN: [id number] DOB: 1955-02-22 Today's Date: 02/27/2019   History of Present Illness  Karen [name] is a 32yoF who comes to [facility] on 2/21 c HA, dizziness, imbalance, difficulty speaking. Pt also reports subjective Rt sided weakness. PMH: DM, HTN, ETOH pancreatitis, ETOH abuse in remission, Left frozen shoulder.  Clinical Impression  Pt admitted with above diagnosis. Pt currently with functional limitations due to the deficits listed below (see "PT Problem List"). Upon entry, pt in bed, awake and agreeable to participate. Author obtained 2nd BG prior to session to assure without safe range. The pt is alert and oriented x4, pleasant, conversational, and generally a good historian although often tangential in responses. Pt reports no dizziness upon arrival, but reports dizziness each time she attempts to sit up. She cannot clearly describe the phenomenon as vertiginous or presyncopal. Pt also reports frontal HA which is worse when author assesses A/ROM of cervical rotation. BP assessed in supine, then sitting tall in bed, both hypertensive and without orthostatic drop. Pt has significant increased c/o dizziness with elevation of HOB, no correlating changes in her normal oculomotor exam. Pt uses the room spinning a few times in session, although I do not think there is any true vertiginous phenomenon. After BP assessment, pt endorses resolution of dizziness, then contradicts self in c/o of dizziness, then cannot relate her current level of dizziness to supine or before session despite being asked multiple times and in multiple ways. Functional mobility assessment demonstrates increased effort/time requirements, poor tolerance, and need for physical assistance, whereas the patient performed these at a higher level of independence PTA. Dizziness is CC without BP changes or oculomotor changes. Strength is grossly symmetrical. Pt also limited by cervical  rotation, flexion. Pt left sitting up at EOS, encouraged to stay upright for 1-2 hours. Pt will benefit from skilled PT intervention to increase independence and safety with basic mobility in preparation for discharge to the venue listed below.       Follow Up Recommendations SNF    Equipment Recommendations  None recommended by PT    Recommendations for Other Services       Precautions / Restrictions Precautions Precautions: Fall Precaution Comments: *seizure precautions at eval, unclear why Restrictions Weight Bearing Restrictions: No      Mobility  Bed Mobility               General bed mobility comments: Pt reluctant to attempt moving to EOB for dizziness issues and progression of HA; pt cued to scoot up in bed, unable despite 3 attempts, c/o low back soreness.  Transfers                    Ambulation/Gait                Stairs            Wheelchair Mobility    Modified Rankin (Stroke Patients Only)       Balance Overall balance assessment: (unable to assess this visit)                                           Pertinent Vitals/Pain Pain Assessment: 0-10 Pain Score: 6  Pain Location: frontal headache Pain Intervention(s): Limited activity within patient's tolerance;Monitored during session;Premedicated before session    Home Living Family/patient expects to be discharged to:: Private  residence Living Arrangements: Spouse/significant other(Boyfriend  Belenda Cruise) Available Help at Discharge: Friend(s);Available PRN/intermittently Type of Home: House Home Access: Stairs to enter Entrance Stairs-Rails: None Entrance Stairs-Number of Steps: 2 Home Layout: One level Home Equipment: Cane - single point;Grab bars - tub/shower;Walker - 2 wheels      Prior Function Level of Independence: Needs assistance   Gait / Transfers Assistance Needed: has been using RW foer AMB since prior admission 3WA  ADL's / Homemaking  Assistance Needed: Pt states she has needed assistance for bathing, dressing, and opening things (water bottles, jars, etc) for at least one year secondary to what she describes as nerve pain of L shoulder.        Hand Dominance        Extremity/Trunk Assessment   Upper Extremity Assessment Upper Extremity Assessment: Overall WFL for tasks assessed;Generalized weakness(grossly 5/5 symmetrical; LUE limited by adhesive capsulitis pain in shoulder)    Lower Extremity Assessment Lower Extremity Assessment: Overall WFL for tasks assessed;Generalized weakness(Grossly 5/5, symetrical)       Communication   Communication: No difficulties  Cognition Arousal/Alertness: Awake/alert Behavior During Therapy: WFL for tasks assessed/performed Overall Cognitive Status: Difficult to assess                                 General Comments: interactive, oriented x4, tangential in response, responses sometimes unhelpful at times.      General Comments      Exercises     Assessment/Plan    PT Assessment Patient needs continued PT services  PT Problem List Decreased strength;Decreased activity tolerance;Decreased mobility       PT Treatment Interventions Gait training;Stair training;Balance training;DME instruction;Functional mobility training;Therapeutic activities;Therapeutic exercise    PT Goals (Current goals can be found in the Care Plan section)  Acute Rehab PT Goals Patient Stated Goal: dizziness to stop PT Goal Formulation: With patient Time For Goal Achievement: 03/13/19 Potential to Achieve Goals: Good    Frequency Min 2X/week   Barriers to discharge        Co-evaluation               AM-PAC PT "6 Clicks" Mobility  Outcome Measure Help needed turning from your back to your side while in a flat bed without using bedrails?: A Lot Help needed moving from lying on your back to sitting on the side of a flat bed without using bedrails?: A Lot Help  needed moving to and from a bed to a chair (including a wheelchair)?: A Lot Help needed standing up from a chair using your arms (e.g., wheelchair or bedside chair)?: A Lot Help needed to walk in hospital room?: A Lot Help needed climbing 3-5 steps with a railing? : A Lot 6 Click Score: 12    End of Session   Activity Tolerance: Patient tolerated treatment well Patient left: in bed;with call bell/phone within reach;with bed alarm set;Other (comment)(sitting tall in bed) Nurse Communication: (BG) PT Visit Diagnosis: Difficulty in walking, not elsewhere classified (R26.2);Other abnormalities of gait and mobility (R26.89);Muscle weakness (generalized) (M62.81);Dizziness and giddiness (R42)    Time: [id number] PT Time Calculation (min) (ACUTE ONLY): 23 min   Charges:   PT Evaluation $PT Eval Moderate Complexity: 1 Mod          10:12 AM, 02/27/19 Etta Grandchild, PT, DPT Physical Therapist - [facility]  [phone] ([place])    Jyla Hopf C 02/27/2019, 10:01 AM

## 2019-02-27 NOTE — TOC Initial Note (Signed)
Transition of Care [facility]) - Initial/Assessment Note    Patient Details  Name: Karen [name] MRN: [id number] Date of Birth: 1955-05-01  Transition of Care [facility]) CM/SW Contact:    Elease Hashimoto, LCSW Phone Number: 02/27/2019, 10:15 AM  Clinical Narrative: Met with pt remember her from last month-Jan when here. Lives with boyfriend who works during the day. He helps her at night. Pt is connected with PCS services now and will start having aide for 2 hrs per day to assist her with home management tasks. Pt was independent with rolling walker and did well until this admit. She reports she never had home health and they did not call when she was discharged before. Will see if can find a home health agency to take Medicaid. Will follow along to see if pt has any discharge needs.                  Expected Discharge Plan: [place] Barriers to Discharge: Continued Medical Work up   Patient Goals and CMS Choice Patient states their goals for this hospitalization and ongoing recovery are:: I want to feel better and go home      Expected Discharge Plan and Services Expected Discharge Plan: [place] In-house Referral: Clinical Social Work   Post Acute Care Choice: [place] arrangements for the past 2 months: Apartment                                      Prior Living Arrangements/Services Living arrangements for the past 2 months: Apartment Lives with:: Significant Other Patient language and need for interpreter reviewed:: No        Need for Family Participation in Patient Care: Yes (Comment) Care giver support system in place?: Yes (comment) Current home services: DME, Other (comment)(Rw and cane, PCS services to start when DC)    Activities of Daily Living Home Assistive Devices/Equipment: None ADL Screening (condition at time of admission) Patient's cognitive ability adequate to safely complete daily activities?: Yes Is the  patient deaf or have difficulty hearing?: No Does the patient have difficulty seeing, even when wearing glasses/contacts?: No Does the patient have difficulty concentrating, remembering, or making decisions?: No Patient able to express need for assistance with ADLs?: Yes Does the patient have difficulty dressing or bathing?: Yes Independently performs ADLs?: Yes (appropriate for developmental age) Does the patient have difficulty walking or climbing stairs?: Yes Weakness of Legs: Both Weakness of Arms/Hands: None  Permission Sought/Granted                  Emotional Assessment Appearance:: Appears stated age Attitude/Demeanor/Rapport: Guarded, Gracious Affect (typically observed): Adaptable, Accepting Orientation: : Oriented to Self, Oriented to Place, Oriented to  Time, Oriented to Situation Alcohol / Substance Use: Other (comment)(4 years since used ETOH according to pt) Psych Involvement: No (comment)  Admission diagnosis:  TIA (transient ischemic attack) [G45.9] Headache [R51.9] Ischemic stroke ([place]) [I63.9] Acute nonintractable headache, unspecified headache type [R51.9] Type 2 diabetes mellitus without complication, with long-term current use of insulin ([place]) [E11.9, Z79.4] Patient Active Problem List   Diagnosis Date Noted  . Headache 02/26/2019  . Dizziness 02/26/2019  . Hyperglycemia due to type 2 diabetes mellitus ([place]) 01/30/2019  . Acute pancreatitis 01/30/2019  . Constipation   . Acute on chronic pancreatitis ([place]) 01/25/2019  . Acute pain of left shoulder   .  Adhesive capsulitis of left shoulder   . Abnormal LFTs 01/24/2019  . Cough 01/24/2019  . Hypertension   . Hypercholesteremia   . Type 2 diabetes mellitus with hyperlipidemia ([place])   . GERD (gastroesophageal reflux disease)   . Acute renal failure superimposed on stage 3a chronic kidney disease ([place])   . Pancreatitis, recurrent   . Chronic pancreatitis ([place]) 09/25/2018  . 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 Coffee ground emesis   . Upper GI bleed 11/11/2017  . Pancreatitis, acute 01/03/2017  . ETOH abuse 01/03/2017  . UTI (urinary tract infection) 01/03/2017  . Nausea and vomiting   . Esophageal candidiasis ([place])   . Intractable vomiting with nausea   . Hypertensive urgency 04/21/2016  . Abdominal pain   . Nausea vomiting and diarrhea   . Smoker   . Poorly controlled type 2 diabetes mellitus ([place])   . Acute renal insufficiency 10/29/2015  . Elevated troponin 10/29/2015  . Urinary tract infection without hematuria 10/29/2015  . Left flank pain 10/29/2015  . Malignant essential hypertension 10/29/2015  . DKA (diabetic ketoacidoses) ([place]) 02/17/2015  . Emphysematous cystitis    PCP:  Lavera Guise, MD Pharmacy:   [place], [place] [address] Phone: [phone] Fax: [phone]     Social Determinants of Health (SDOH) Interventions    Readmission Risk Interventions No flowsheet data found.

## 2019-02-27 NOTE — Progress Notes (Signed)
Pt visited, chart reviewed. Pt is tolerating a carb modified diet without swallowing difficulty.  She is able to communicate needs and wants and reports her speech is at [place]. Pt encouraged to let her primary MD know if she has any speaking difficulty after discharged home for possible OP ST cognitive language eval. No ST needs at this time.

## 2019-02-27 NOTE — Progress Notes (Signed)
*  PRELIMINARY RESULTS* Echocardiogram 2D Echocardiogram has been performed.  Karen [name] 02/27/2019, 11:51 AM

## 2019-02-27 NOTE — Evaluation (Signed)
Occupational Therapy Evaluation Patient Details Name: Karen [name] MRN: [id number] DOB: 1955-11-28 Today's Date: 02/27/2019    History of Present Illness Karen [name] is a 8yoF who comes [facility] on [facility] c HA, dizziness, imbalance, difficulty speaking. Pt also reports subjective Rt sided weakness. PMH: DM, HTN, ETOH pancreatitis, ETOH abuse in remission, Left frozen shoulder.   Clinical Impression   Patient seen this date for OT evaluation.  Patient supine in bed and agreeable to therapy session, but fearful of moving.  Patient states she does not have pain currently but feels dizzy any way that she moves.  Vitals remained [facility] for duration of session.  Patient attempted to move to EOB using railing but disengaged stating that she was too dizzy.  Provided education to patient on importance of sitting up in bed or recliner throughout day to improve health and vestibular system.  Educated patient on goals of occupational therapy and simple ther-ex (ankle pumps) to complete while supine in bed.  Unable to assess functional transfers or balance at this time.  Patient able to perform simple grooming tasks at bed level with set up.  Continues to require skilled occupational therapy to address strengthening, activity tolerance, safety awareness, functional transfers and ADL retraining.  Based on today's performance, recommending SNF at discharge.      Follow Up Recommendations  SNF    Equipment Recommendations  Other (comment)(defer to next level of care)    Recommendations for Other Services       Precautions / Restrictions Precautions Precautions: Fall Precaution Comments: *seizure precautions at eval, unclear why Restrictions Weight Bearing Restrictions: No      Mobility Bed Mobility               General bed mobility comments: Patient attempted to move to EOB but quickly put head back on pillow stating she was "Just too dizzy" to move anymore.  Transfers                  General transfer comment: Unable to assess transfers at this time    Balance                                           ADL either performed or assessed with clinical judgement   ADL Overall ADL's : Needs assistance/impaired     Grooming: Wash/dry hands;Wash/dry face;Set up;Bed level           Upper Body Dressing : Minimal assistance;Bed level Upper Body Dressing Details (indicate cue type and reason): Poor ROM in L UE and dizzness Lower Body Dressing: Moderate assistance;Bed level Lower Body Dressing Details (indicate cue type and reason): Dizziness affects performance   Toilet Transfer Details (indicate cue type and reason): Unable to assess secondary to dizziness.           General ADL Comments: Unable to give full effort due to dizziness     Vision Baseline Vision/History: No visual deficits Patient Visual Report: No change from baseline Vision Assessment?: No apparent visual deficits Additional Comments: No complaints of floating dots, blacking out, blurred vision or double vision.     Perception     Praxis      Pertinent Vitals/Pain Pain Assessment: No/denies pain(No complaints of headache.  Only dizziness.)     Hand Dominance     Extremity/Trunk Assessment Upper Extremity Assessment Upper Extremity Assessment: Generalized weakness;Overall [facility] for  tasks assessed   Lower Extremity Assessment Lower Extremity Assessment: Defer to PT evaluation       Communication Communication Communication: No difficulties   Cognition Arousal/Alertness: Awake/alert Behavior During Therapy: WFL for tasks assessed/performed Overall Cognitive Status: Difficult to assess                                 General Comments: Patient changes responses to questions within the conversation, making it difficult to obtain clear PLOF.   General Comments  Patient able to perform better when distracted from dizziness. BP at 129/66, 77 pulse.     Exercises Other Exercises Other Exercises: Educated patient on ankle pumps while in bed. Other Exercises: Educated patient on importance of sitting up throughout day to improve vestibular system and overall health.   Shoulder Instructions      Home Living Family/patient expects to be discharged to:: Private residence Living Arrangements: Spouse/significant other Available Help at Discharge: Friend(s);Available PRN/intermittently Type of Home: House Home Access: Stairs to enter [employer] of Steps: 2 Entrance Stairs-Rails: None Home Layout: One level     Bathroom Shower/Tub: Tub/shower unit         Home Equipment: Cane - single point;Grab bars - tub/shower;Walker - 2 wheels          Prior Functioning/Environment Level of Independence: Needs assistance  Gait / Transfers Assistance Needed: Patient uses RW for functional mobility. ADL's / Homemaking Assistance Needed: Pt states her significant other assists her with BADLs (bathing, dressing, [place]) at times due to pain in L shoulder.            OT Problem List: Decreased strength;Decreased activity tolerance;Decreased safety awareness      OT Treatment/Interventions: Self-care/ADL training;Therapeutic exercise;Energy conservation;Therapeutic activities;Patient/family education    OT Goals(Current goals can be found in the care plan section) Acute Rehab OT Goals Patient Stated Goal: dizziness to stop OT Goal Formulation: With patient Time For Goal Achievement: 03/13/19 Potential to Achieve Goals: Good  OT Frequency: Min 2X/week   Barriers to D/C:            Co-evaluation              AM-PAC OT "6 Clicks" Daily Activity     Outcome Measure Help from another person eating meals?: None Help from another person taking care of personal grooming?: None Help from another person toileting, which includes using toliet, bedpan, or urinal?: A Lot Help from another person bathing (including washing,  rinsing, drying)?: A Lot Help from another person to put on and taking off regular upper body clothing?: A Lot Help from another person to put on and taking off regular lower body clothing?: A Lot 6 Click Score: 16   End of Session    Activity Tolerance: Other (comment)(Limited by dizziness) Patient left: in bed;with call bell/phone within reach;with bed alarm set  OT Visit Diagnosis: Muscle weakness (generalized) (M62.81)                Time: [id number] OT Time Calculation (min): 19 min Charges:  OT General Charges $OT Visit: 1 Visit OT Evaluation $OT Eval Low Complexity: 1 Low OT Treatments $Therapeutic Activity: 8-22 mins  Baldomero Lamy, MS, OTR/L 02/27/19, 4:24 PM

## 2019-02-27 NOTE — Progress Notes (Signed)
PHARMACIST - PHYSICIAN COMMUNICATION  CONCERNING: IV to Oral Route Change Policy  RECOMMENDATION: This patient is receiving Protonix  by the intravenous route.  Based on criteria approved by the Pharmacy and Therapeutics Committee, the intravenous medication(s) is/are being converted to the equivalent oral dose form(s).   DESCRIPTION: These criteria include:  The patient is eating (either orally or via tube) and/or has been taking other orally administered medications.   The patient has no evidence of active gastrointestinal bleeding or impaired GI absorption (gastrectomy, short bowel, patient on TNA or NPO).Pernell Dupre, PharmD, BCPS Clinical Pharmacist 02/27/2019 9:17 AM

## 2019-02-27 NOTE — Progress Notes (Signed)
Inpatient Diabetes Program Recommendations  AACE/ADA: New Consensus Statement on Inpatient Glycemic Control (2015)  Target Ranges:  Prepandial:   less than 140 mg/dL      Peak postprandial:   less than 180 mg/dL (1-2 hours)      Critically ill patients:  140 - 180 mg/dL   Results for REET, ALLAN (MRN [id number]) as of 02/27/2019 08:54  Ref. Range 02/26/2019 11:44  Glucose-Capillary Latest Ref Range: 70 - 99 mg/dL 261 (H)   Results for ALLIA, HIEGEL (MRN [id number]) as of 02/27/2019 08:54  Ref. Range 01/30/2019 02:30  Hemoglobin A1C Latest Ref Range: 4.8 - 5.6 % 11.0 (H)     Admit with: Headache/ Elevated BP  History: DM, Alcoholic Pancreatitis, CKD  Home DM Meds: 70/30 Insulin 15 units BID  Current Orders: None yet    Counseled by the Diabetes Coordinator on 01/30/2019--Pt told Diabetes Coordinator she takes 70/30 Insulin 15 units BID at home--Checks CBGs BID--Sees Dr. Humphrey Rolls for her diabetes.    MD- Per Chart Review, pt is taking 70/30 Insulin 15 units BID at home.  No orders for Insulin yet.  Please consider the following:  1. Start Novolog Sensitive Correction Scale/ SSI (0-9 units) TID AC + HS  2. Start 70/30 Insulin 10 units BID with meals (70% home dose to start)    --Will follow patient during hospitalization--  Wyn Quaker RN, MSN, CDE Diabetes Coordinator Inpatient Glycemic Control Team Team Pager: [phone] (8a-5p)

## 2019-02-27 NOTE — TOC Progression Note (Signed)
Transition of Care [facility]) - Progression Note    Patient Details  Name: Karen [name] MRN: [id number] Date of Birth: 1955/11/18  [facility]) CM/SW Contact  Wayne Brunker, Gardiner Rhyme, LCSW Phone Number: 02/27/2019, 3:36 PM  Clinical Narrative:   Met with pt to discuss PT recommends SNF/Rehab when she leaves here due to her care needs. Pt voiced: " I am not going to one of those. " She has her boyfriend and is getting set up with PCS services for an aide for 2-3 hours per day. She feels she would get worse in a rehab. Pt seems to do what she wants and at times it is not the best for her. Will see her tomorrow and work on discharge needs.    Expected Discharge Plan: [place] Barriers to Discharge: Continued Medical Work up  Expected Discharge Plan and Services Expected Discharge Plan: [place] In-house Referral: Clinical Social Work   Post Acute Care Choice: [employer] arrangements for the past 2 months: Apartment                                       Social Determinants of Health (SDOH) Interventions    Readmission Risk Interventions No flowsheet data found.

## 2019-02-28 DIAGNOSIS — K219 Gastro-esophageal reflux disease without esophagitis: Secondary | ICD-10-CM

## 2019-02-28 LAB — ANA W/REFLEX IF POSITIVE: Anti Nuclear Antibody (ANA): NEGATIVE

## 2019-02-28 LAB — GLUCOSE, CAPILLARY
Glucose-Capillary: 277 mg/dL — ABNORMAL HIGH (ref 70–99)
Glucose-Capillary: 402 mg/dL — ABNORMAL HIGH (ref 70–99)
Glucose-Capillary: 326 mg/dL — ABNORMAL HIGH (ref 70–99)
Glucose-Capillary: 215 mg/dL — ABNORMAL HIGH (ref 70–99)
Glucose-Capillary: 244 mg/dL — ABNORMAL HIGH (ref 70–99)
Glucose-Capillary: 353 mg/dL — ABNORMAL HIGH (ref 70–99)

## 2019-02-28 LAB — ETHANOL: Alcohol, Ethyl (B): <10 mg/dL (ref <10)

## 2019-02-28 LAB — GLUCOSE, RANDOM: Glucose, Bld: 408 mg/dL — ABNORMAL HIGH (ref 70–99)

## 2019-02-28 MED ORDER — INSULIN ASPART 100 UNIT/ML ~~LOC~~ SOLN: 4.0000 [IU] | Freq: Three times a day (TID) | SUBCUTANEOUS | Status: DC

## 2019-02-28 MED ORDER — DOCUSATE SODIUM 100 MG PO CAPS
200.0000 mg | ORAL_CAPSULE | Freq: Two times a day (BID) | ORAL | Status: DC
  Administered 2019-02-28 – 2019-03-01 (×4): 200 mg via ORAL
  Filled 2019-02-28 (×4): qty 2

## 2019-02-28 MED ORDER — INSULIN ASPART PROT & ASPART (70-30 MIX) 100 UNIT/ML ~~LOC~~ SUSP
15.0000 [IU] | Freq: Two times a day (BID) | SUBCUTANEOUS | Status: DC
  Administered 2019-02-28 (×2): 15 [IU] via SUBCUTANEOUS
  Filled 2019-02-28: qty 10

## 2019-02-28 MED ORDER — DIPHENHYDRAMINE HCL 25 MG PO CAPS
25.0000 mg | ORAL_CAPSULE | Freq: Four times a day (QID) | ORAL | Status: DC | PRN
  Administered 2019-02-28 – 2019-03-01 (×3): 25 mg via ORAL
  Filled 2019-02-28 (×3): qty 1

## 2019-02-28 MED ORDER — INSULIN ASPART 100 UNIT/ML ~~LOC~~ SOLN
10.0000 [IU] | Freq: Once | SUBCUTANEOUS | Status: AC
  Administered 2019-02-28: 10 [IU] via SUBCUTANEOUS
  Filled 2019-02-28: qty 1

## 2019-02-28 MED ORDER — INSULIN GLARGINE 100 UNIT/ML ~~LOC~~ SOLN
10.0000 [IU] | Freq: Every day | SUBCUTANEOUS | Status: DC
  Filled 2019-02-28: qty 0.1

## 2019-02-28 MED ORDER — INSULIN REGULAR HUMAN 100 UNIT/ML IJ SOLN: 4.0000 [IU] | Freq: Three times a day (TID) | INTRAMUSCULAR | Status: DC

## 2019-02-28 NOTE — Progress Notes (Signed)
Pt care being transitioned to new RN. Report given to [place], RN for patient.  

## 2019-02-28 NOTE — Progress Notes (Signed)
Progress Note    Karen [name]  [id number] DOB: [birth year]  DOA: 02/26/2019 PCP: Lavera Guise, MD      Brief Narrative:  Medical records reviewed and are as summarized below:  Karen [name] is an 64 y.o. female with medical history significant for hypertension, type 2 diabetes mellitus, hyperlipidemia, CAD, chronic pancreatitis, history of alcohol use disorder, who presented to the hospital with nausea, headache, dizziness and unsteady gait.  Assessment/Plan:   Active Problems:   ETOH abuse   Hypertension   Type 2 diabetes mellitus with hyperlipidemia (HCC)   GERD (gastroesophageal reflux disease)   Headache   Dizziness   Body mass index is 21.73 kg/m.   Headache and dizziness likely due to BPPV: MRI brain did not show any acute abnormality or stroke.  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 2D echo showed EF estimated at 65 to [id number], grade 1 diastolic dysfunction - continue meclizine, diazepam and Zofran.  Discontinue Plavix.  Patient has no history of stroke.  PT and OT eval for vestibular training and unsteady gait  Hypertension: Resume home amlodipine.  Type II DM with hyperglycemia: Hemoglobin A1c is 11.1 - increase novolog 70/30 to 15 units BID   Hyperlipidemia: Uncontrolled.  Total cholesterol 222, triglycerides 355, LDL 107.  Continue Crestor  Chronic pancreatitis: Continue Creon  History of alcohol use disorder: Patient said she has not drank alcohol in 4 years.  Alcohol level was 15 which was above normal limits.     Family Communication/Anticipated D/C date and plan/Code Status   DVT prophylaxis: Lovenox Code Status: Full code Family Communication: Plan discussed with patient Disposition Plan: Possible discharge to home in 1 to 2 days once blood sugar better controlled and symptoms better. Patient refuses STR/SNF   Subjective:  Blood  Sugars running high, feels dizzi/vertigo  when she gets up Objective:    Vitals:   02/28/19 0437 02/28/19 0730 02/28/19 1130 02/28/19 1155  BP: (!) 158/78 130/76  132/71  Pulse: 72 72 85 80  Resp: 17 16  17   Temp: 97.7 F (36.5 C) 97.7 F (36.5 C)  98.2 F (36.8 C)  TempSrc: Oral Oral  Oral  SpO2: 96% 95% 98% 99%  Weight:      Height:        Intake/Output Summary (Last 24 hours) at 02/28/2019 1612 Last data filed at 02/28/2019 [id number] Gross per 24 hour  Intake 994.17 ml  Output 500 ml  Net 494.17 ml   Filed Weights   02/26/19 1140  Weight: 52.2 kg   Constitutional: Negative for diaphoresis, fever, malaise/fatigue and weight loss.  HENT: Negative for ear discharge, ear pain, hearing loss, nosebleeds, sore throat and tinnitus.   Eyes: Negative for blurred vision and pain.  Respiratory: no cough/sob. Negative for hemoptysis and wheezing.   Cardiovascular: Negative for chest pain, palpitations, orthopnea and leg swelling.  Gastrointestinal: Negative for abdominal pain, blood in stool, constipation, diarrhea, heartburn, nausea and vomiting.  Genitourinary: Negative for dysuria, frequency and urgency.  Musculoskeletal: Negative for back pain and myalgias.  Skin: Negative for itching and rash.  Neurological: positive for dizziness and vertigo, no tingling, tremors, focal weakness, seizures, weakness and headaches.  Psychiatric/Behavioral: Negative for depression. The patient is not nervous/anxious.     Exam:  GEN: NAD SKIN: No rash EYES: EOMI, PERRLA, horizontal nystagmus ENT: MMM CV: RRR PULM: CTA B ABD: soft, ND, NT, +BS CNS:  AAO x 3, non focal EXT: No edema or tenderness   Data Reviewed:   I have personally reviewed following labs and imaging studies:  Labs: Labs show the following:   Basic Metabolic Panel: Recent Labs  Lab 02/26/19 1151 02/27/19 0311 02/28/19 0758  NA 139 138  --   K 4.4 4.3  --   CL 104 105  --   CO2 27 26  --   GLUCOSE 258* 342* 408*  BUN 34* 28*  --   CREATININE 1.23*  1.11*  --   CALCIUM 8.9 8.7*  --    GFR Estimated Creatinine Clearance: 39.7 mL/min (A) (by C-G formula based on SCr of 1.11 mg/dL (H)). Liver Function Tests: Recent Labs  Lab 02/26/19 1814 02/27/19 0311  AST 22 18  ALT 39 34  ALKPHOS 293* 271*  BILITOT 0.8 0.5  PROT 7.0 7.2  ALBUMIN 3.5 3.4*   Recent Labs  Lab 02/26/19 1814  LIPASE 55*   No results for input(s): AMMONIA in the last 168 hours. Coagulation profile Recent Labs  Lab 02/26/19 1151  INR 0.9    CBC: Recent Labs  Lab 02/26/19 1151 02/27/19 0311  WBC 8.6 7.2  NEUTROABS  --  3.2  HGB 12.2 10.9*  HCT 36.1 32.5*  MCV 83.6 84.2  PLT 194 192   Cardiac Enzymes: No results for input(s): CKTOTAL, CKMB, CKMBINDEX, TROPONINI in the last 168 hours. BNP (last 3 results) No results for input(s): PROBNP in the last 8760 hours. CBG: Recent Labs  Lab 02/27/19 1652 02/27/19 2105 02/28/19 0732 02/28/19 1022 02/28/19 1127  GLUCAP 277* 194* 402* 326* 215*   D-Dimer: No results for input(s): DDIMER in the last 72 hours. Hgb A1c: Recent Labs    02/27/19 0311  HGBA1C 11.1*   Lipid Profile: Recent Labs    02/27/19 0311  CHOL 222*  HDL 44  LDLCALC 107*  TRIG 355*  CHOLHDL 5.0   Thyroid function studies: Recent Labs    02/26/19 1814  TSH 1.960   Anemia work up: No results for input(s): VITAMINB12, FOLATE, FERRITIN, TIBC, IRON, RETICCTPCT in the last 72 hours. Sepsis Labs: Recent Labs  Lab 02/26/19 1151 02/27/19 0311  WBC 8.6 7.2    Microbiology Recent Results (from the past 240 hour(s))  SARS CORONAVIRUS 2 (TAT 6-24 HRS) Nasopharyngeal Nasopharyngeal Swab     Status: None   Collection Time: 02/26/19 12:11 PM   Specimen: Nasopharyngeal Swab  Result Value Ref Range Status   SARS Coronavirus 2 NEGATIVE NEGATIVE Final    Comment: (NOTE) SARS-CoV-2 target nucleic acids are NOT DETECTED. The SARS-CoV-2 RNA is generally detectable in upper and lower respiratory specimens during the acute  phase of infection. Negative results do not preclude SARS-CoV-2 infection, do not rule out co-infections with other pathogens, and should not be used as the sole basis for treatment or other patient management decisions. Negative results must be combined with clinical observations, patient history, and epidemiological information. The expected result is Negative. Fact Sheet for Patients: SugarRoll.be Fact Sheet for Healthcare Providers: https://www.woods-mathews.com/ This test is not yet approved or cleared by the [place] FDA and  has been authorized for detection and/or diagnosis of SARS-CoV-2 by FDA under an Emergency Use Authorization (EUA). This EUA will remain  in effect (meaning this test can be used) for the duration of the COVID-19 declaration under Section 56 4(b)(1) of the Act, 21 U.S.C. section 360bbb-3(b)(1), unless the authorization is terminated or revoked sooner. Performed at [facility], [address], [place], [place] [address]     Procedures and diagnostic studies:  MR ANGIO HEAD WO CONTRAST  Result Date: 02/26/2019 CLINICAL DATA:  64 year old female with headache and dizziness. EXAM: MRA HEAD WITHOUT CONTRAST TECHNIQUE: Angiographic images of the Circle of Willis were obtained using MRA technique without intravenous contrast. COMPARISON:  Brain MRI and neck MRA today.  Neck CT 12/19/2018. FINDINGS: Antegrade flow in the posterior circulation. Irregularity of the right vertebral artery V4 segment corresponds to calcified plaque seen on the CT in December. However, only mild stenosis is suggested by MRA. The right PICA origin remains patent. The more dominant appearing left V4 segment appears normal to the vertebrobasilar junction. Left PICA origin is patent. Patent basilar artery. Patent SCA and PCA origins. Posterior communicating arteries are diminutive or absent. Right PCA branches are within normal limits. There is  mild to moderate left PCA P2 segment irregularity and stenosis on series 1045 image 5. Symmetric distal PCA flow signal. Antegrade flow in both ICA siphons. The siphons were mostly not included in December. There is moderate to severe signal loss at the left anterior genu (series 1029, image 13). And similar contralateral right supraclinoid segment irregularity and stenosis (same image). Both carotid termini remain normal MCA patent. And ACA origins. Anterior communicating artery and visible ACA branches are within normal limits. Right MCA M1 segment and bifurcation appears patent without definite stenosis. The left MCA bifurcates early, and source images do not suggest stenosis which is questionable on the MIP images. Visible left MCA branches are within normal limits. IMPRESSION: 1. Positive for intracranial atherosclerosis with at least moderate stenosis of both ICA siphons and the left PCA P2. 2. No circle-of-Willis aneurysm or branch occlusion identified. Electronically Signed   By: Genevie Ann M.D.   On: 02/26/2019 16:46   MR Angiogram Neck W or Wo Contrast  Result Date: 02/26/2019 CLINICAL DATA:  64 year old female with headache and dizziness. EXAM: MRA NECK WITHOUT AND WITH CONTRAST TECHNIQUE: Multiplanar and multiecho pulse sequences of the neck were obtained without and with intravenous contrast. Angiographic images of the neck were obtained using MRA technique without and with intravenous contrast. CONTRAST:  32mL GADAVIST GADOBUTROL 1 MMOL/ML IV SOLN COMPARISON:  Neck CT 12/19/2018. Brain MRI today. Brain MRI 04/21/2016. FINDINGS: Precontrast time-of-flight images demonstrate antegrade flow in both cervical carotid and vertebral arteries to the skull base. The left vertebral appears slightly dominant. Carotid bifurcations appear within normal limits. Post-contrast neck MRA images reveal a 3 vessel arch configuration. Great vessel origins appear normal. Mildly tortuous but otherwise negative right CCA. At  the posterior right ICA origin there is mild irregularity compatible with plaque but no significant cervical right ICA stenosis. There was corresponding right ICA origin atherosclerosis on the December CT comparison. Mildly tortuous left CCA. Negative left carotid bifurcation and cervical left ICA. There was left carotid bifurcation atherosclerosis on the December CT comparison. Tortuous proximal right subclavian artery with a stenotic appearance at the thoracic inlet. And this corresponds to a combination of calcified plaque and tortuosity of the vessel with a kinked appearance on the December CT. Both vertebral artery origins appear normal. Mildly dominant left vertebral artery, but no vertebral stenosis identified to the skull base. IMPRESSION: Right subclavian and bilateral carotid bifurcation atherosclerosis which is also demonstrated on a 12/19/2018 neck CT, but negative for hemodynamically significant stenosis. Electronically Signed   By: Genevie Ann M.D.   On: 02/26/2019 16:38   MR BRAIN WO CONTRAST  Result  Date: 02/26/2019 CLINICAL DATA:  64 year old female with headache and dizziness. EXAM: MRI HEAD WITHOUT CONTRAST TECHNIQUE: Multiplanar, multiecho pulse sequences of the brain and surrounding structures were obtained without intravenous contrast. COMPARISON:  Head CT earlier today. Neck CT 12/19/2018. Brain MRI 04/21/2016. FINDINGS: Brain: No restricted diffusion to suggest acute infarction. No midline shift, mass effect, evidence of mass lesion, ventriculomegaly, extra-axial collection or acute intracranial hemorrhage. Cervicomedullary junction and pituitary are within normal limits. Thick gray and white matter signal appears stable throughout the brain since 2018. There are chronic microhemorrhages in the right periatrial white matter. But otherwise mild for age nonspecific white matter T2 and FLAIR hyperintensity. No cortical encephalomalacia. Deep gray nuclei, brainstem and cerebellum remain normal  for age. Vascular: Major intracranial vascular flow voids are stable since 2018. Skull and upper cervical spine: Normal for age visible cervical spine. Visualized bone marrow signal is within normal limits. Sinuses/Orbits: Stable and negative. Other: Stable mild left mastoid effusion. Negative nasopharynx. Grossly normal visible internal auditory structures. Scalp and face soft tissues appear negative. IMPRESSION: 1. No acute intracranial abnormality. 2. Stable noncontrast MRI appearance of the brain since 2018: chronic microhemorrhages in the right hemisphere indicative of small-vessel disease although all only mild for age white matter signal changes otherwise. Electronically Signed   By: Genevie Ann M.D.   On: 02/26/2019 16:33   ECHOCARDIOGRAM COMPLETE  Result Date: 02/27/2019    ECHOCARDIOGRAM REPORT   Patient Name:   KYRSTIN BLANKS Date of Exam: 02/27/2019 Medical Rec #:  [id number]    Height:       61.0 in Accession #:    [id number]   Weight:       115.0 lb Date of Birth:  01-20-1955     BSA:          1.493 m Patient Age:    74 years     BP:           153/80 mmHg Patient Gender: F            HR:           69 bpm. Exam Location:  [facility] Procedure: 2D Echo, Color Doppler and Cardiac Doppler Indications:     G45.9 TIA  History:         Patient has prior history of Echocardiogram examinations. Risk                  Factors:Hypertension, Diabetes and HCL.  Sonographer:     Charmayne Sheer RDCS (AE) Referring Phys:  [place] Diagnosing Phys: Kathlyn Sacramento MD IMPRESSIONS  1. Left ventricular ejection fraction, by estimation, is 65 to 70%. The left ventricle has normal function. The left ventricle has no regional wall motion abnormalities. There is mild left ventricular hypertrophy. Left ventricular diastolic parameters are consistent with Grade I diastolic dysfunction (impaired relaxation).  2. Right ventricular systolic function is normal. The right ventricular size is normal. Tricuspid regurgitation signal is  inadequate for assessing PA pressure.  3. The mitral valve is normal in structure and function. No evidence of mitral valve regurgitation. No evidence of mitral stenosis.  4. The aortic valve is normal in structure and function. Aortic valve regurgitation is not visualized. No aortic stenosis is present.  5. The inferior vena cava is normal in size with greater than 50% respiratory variability, suggesting right atrial pressure of 3 mmHg. FINDINGS  Left Ventricle: Left ventricular ejection fraction, by estimation, is 65 to 70%. The left ventricle has normal  function. The left ventricle has no regional wall motion abnormalities. The left ventricular internal cavity size was normal in size. There is  mild left ventricular hypertrophy. Left ventricular diastolic parameters are consistent with Grade I diastolic dysfunction (impaired relaxation). Right Ventricle: The right ventricular size is normal. No increase in right ventricular wall thickness. Right ventricular systolic function is normal. Tricuspid regurgitation signal is inadequate for assessing PA pressure. Left Atrium: Left atrial size was normal in size. Right Atrium: Right atrial size was normal in size. Pericardium: There is no evidence of pericardial effusion. Mitral Valve: The mitral valve is normal in structure and function. Normal mobility of the mitral valve leaflets. No evidence of mitral valve regurgitation. No evidence of mitral valve stenosis. MV peak gradient, 4.8 mmHg. The mean mitral valve gradient is 2.0 mmHg. Tricuspid Valve: The tricuspid valve is normal in structure. Tricuspid valve regurgitation is not demonstrated. No evidence of tricuspid stenosis. Aortic Valve: The aortic valve is normal in structure and function. Aortic valve regurgitation is not visualized. No aortic stenosis is present. Aortic valve mean gradient measures 7.0 mmHg. Aortic valve peak gradient measures 13.7 mmHg. Aortic valve area, by VTI measures 1.26 cm. Pulmonic Valve:  The pulmonic valve was normal in structure. Pulmonic valve regurgitation is not visualized. No evidence of pulmonic stenosis. Aorta: The aortic root is normal in size and structure. Venous: The inferior vena cava is normal in size with greater than 50% respiratory variability, suggesting right atrial pressure of 3 mmHg. IAS/Shunts: No atrial level shunt detected by color flow Doppler.  LEFT VENTRICLE PLAX 2D LVIDd:         3.74 cm  Diastology LVIDs:         2.16 cm  LV e' lateral:   4.68 cm/s LV PW:         0.78 cm  LV E/e' lateral: 16.7 LV IVS:        0.62 cm  LV e' medial:    4.79 cm/s LVOT diam:     1.70 cm  LV E/e' medial:  16.3 LV SV:         44.03 ml LV SV Index:   29.50 LVOT Area:     2.27 cm  RIGHT VENTRICLE RV Basal diam:  2.86 cm LEFT ATRIUM             Index       RIGHT ATRIUM           Index LA diam:        3.00 cm 2.01 cm/m  RA Area:     10.20 cm LA Vol (A2C):   28.6 ml 19.16 ml/m RA Volume:   18.60 ml  12.46 ml/m LA Vol (A4C):   33.0 ml 22.10 ml/m LA Biplane Vol: 32.4 ml 21.70 ml/m  AORTIC VALVE                    PULMONIC VALVE AV Area (Vmax):    1.51 cm     PV Vmax:       0.90 m/s AV Area (Vmean):   1.49 cm     PV Vmean:      61.500 cm/s AV Area (VTI):     1.26 cm     PV VTI:        0.210 m AV Vmax:           185.00 cm/s  PV Peak grad:  3.2 mmHg AV Vmean:  126.000 cm/s PV Mean grad:  2.0 mmHg AV VTI:            0.351 m AV Peak Grad:      13.7 mmHg AV Mean Grad:      7.0 mmHg LVOT Vmax:         123.00 cm/s LVOT Vmean:        82.600 cm/s LVOT VTI:          0.194 m LVOT/AV VTI ratio: 0.55  AORTA Ao Root diam: 2.90 cm MITRAL VALVE MV Area (PHT): 2.99 cm     SHUNTS MV Peak grad:  4.8 mmHg     Systemic VTI:  0.19 m MV Mean grad:  2.0 mmHg     Systemic Diam: 1.70 cm MV Vmax:       1.09 m/s MV Vmean:      61.9 cm/s MV Decel Time: 254 msec MV E velocity: 78.10 cm/s MV A velocity: 101.00 cm/s MV E/A ratio:  0.77 Kathlyn Sacramento MD Electronically signed by Kathlyn Sacramento MD Signature  Date/Time: 02/27/2019/1:29:44 PM    Final     Medications:   . amLODipine  10 mg Oral Daily  . diazepam  5 mg Oral BID  . docusate sodium  200 mg Oral BID  . heparin  5,000 Units Subcutaneous Q8H  . HYDROcodone-acetaminophen  1 tablet Oral QID  . insulin aspart  0-5 Units Subcutaneous QHS  . insulin aspart  0-9 Units Subcutaneous TID WC  . insulin aspart protamine- aspart  15 Units Subcutaneous BID WC  . lipase/protease/amylase  72,000 Units Oral TID WC  . meclizine  25 mg Oral TID  . pantoprazole  40 mg Oral Daily  . pregabalin  150 mg Oral BID  . rosuvastatin  20 mg Oral Daily   Continuous Infusions: . sodium chloride 50 mL/hr at 02/28/19 0830     LOS: 2 days   Adriena Manfre Manuella Ghazi  [facility]     02/28/2019, 4:12 PM

## 2019-02-28 NOTE — Progress Notes (Signed)
Physical Therapy Treatment Patient Details Name: Karen [name] MRN: [id number] DOB: 1955-02-10 Today's Date: 02/28/2019    History of Present Illness Karen [name] is a 44yoF who [facility] on [facility] c HA, dizziness, imbalance, difficulty speaking. Pt also reports subjective Rt sided weakness. PMH: DM, HTN, ETOH pancreatitis, ETOH abuse in remission, Left frozen shoulder.    PT Comments    Pt in bed upon arrival, reports to feel better after having pain meds. Able to perform bed mobility and independent sitting at supervision level, minGuard assist for transfers with a walker and AMB 193ft c RW. Pt happy to sit up in recliner at end of session, meal enroute. RN in room at [facility]. Updating DC recs to home c HHPT and RW.    Follow Up Recommendations  Home health PT     Equipment Recommendations  Rolling walker with 5" wheels    Recommendations for Other Services       Precautions / Restrictions Precautions Precautions: Fall Precaution Comments: *seizure precautions at eval, unclear why Restrictions Weight Bearing Restrictions: No    Mobility  Bed Mobility Overal bed mobility: Needs Assistance Bed Mobility: Supine to Sit     Supine to sit: Supervision     General bed mobility comments: able to sit tall unsupported at EOB, feet dangling  Transfers Overall transfer level: Needs assistance Equipment used: Rolling walker (2 wheeled);1 person hand held assist Transfers: Sit to/from [facility] Sit to Stand: Min guard Stand pivot transfers: Min assist       General transfer comment: safer with RW, more confident with RW  Ambulation/Gait Ambulation/Gait assistance: Min guard Gait Distance (Feet): 120 Feet Assistive device: Rolling walker (2 wheeled) Gait Pattern/deviations: Step-to pattern;WFL(Within Functional Limits)     General Gait Details: very slow approaching apprehensive, intermittent quiet moaning and sighs, but pt reports improved back pain, slight  HA, and minimal dizziness.   Stairs             Wheelchair Mobility    Modified Rankin (Stroke Patients Only)       Balance                                            Cognition Arousal/Alertness: Awake/alert Behavior During Therapy: WFL for tasks assessed/performed Overall Cognitive Status: Within Functional Limits for tasks assessed                                        Exercises      General Comments        Pertinent Vitals/Pain Pain Assessment: Faces Faces Pain Scale: No hurt Pain Location: frontal headache after AMB Pain Descriptors / Indicators: Aching Pain Intervention(s): Limited activity within patient's tolerance;Monitored during session;Premedicated before session;Repositioned    Home Living                      Prior Function            PT Goals (current goals can now be found in the care plan section) Acute Rehab PT Goals Patient Stated Goal: dizziness to stop PT Goal Formulation: With patient Time For Goal Achievement: 03/13/19 Potential to Achieve Goals: Good Progress towards PT goals: Progressing toward goals    Frequency    Min 2X/week  PT Plan Discharge plan needs to be updated    Co-evaluation              AM-PAC PT "6 Clicks" Mobility   Outcome Measure  Help needed turning from your back to your side while in a flat bed without using bedrails?: A Little Help needed moving from lying on your back to sitting on the side of a flat bed without using bedrails?: A Little Help needed moving to and from a bed to a chair (including a wheelchair)?: A Little Help needed standing up from a chair using your arms (e.g., wheelchair or bedside chair)?: A Little Help needed to walk in hospital room?: A Little Help needed climbing 3-5 steps with a railing? : A Lot 6 Click Score: 17    End of Session Equipment Utilized During Treatment: Gait belt Activity Tolerance: Patient  tolerated treatment well;No increased pain Patient left: with call bell/phone within reach;Other (comment);in chair;with nursing/sitter in room;with chair alarm set Nurse Communication: Mobility status PT Visit Diagnosis: Difficulty in walking, not elsewhere classified (R26.2);Other abnormalities of gait and mobility (R26.89);Muscle weakness (generalized) (M62.81);Dizziness and giddiness (R42)     Time: 1100-1130 PT Time Calculation (min) (ACUTE ONLY): 30 min  Charges:  $Therapeutic Exercise: 23-37 mins                     12:00 PM, 02/28/19 Etta Grandchild, PT, DPT Physical Therapist - [facility]  [phone] ([place])    Karen [name] C 02/28/2019, 11:54 AM

## 2019-02-28 NOTE — Progress Notes (Signed)
Stat serum glucose ordered per hyperglycemic insulin management protocol.

## 2019-02-28 NOTE — Progress Notes (Signed)
MD Dr Manuella Ghazi added additional dose of insulin. Given to patient. Will CTM.

## 2019-02-28 NOTE — TOC Progression Note (Signed)
Transition of Care [facility]) - Progression Note    Patient Details  Name: Karen [name] MRN: [id number] Date of Birth: 1955-02-17  Transition of Care [facility]) CM/SW Contact  Devora Tortorella, Gardiner Rhyme, LCSW Phone Number: 02/28/2019, 1:21 PM  Clinical Narrative:  [facility] has accepted pt to provide follow up therapy when discharged. Pt was to have [facility] from last admission but pt reports she was never called and no one came out from their agency. Discussed with pt she is agreeable to have [facility] see her at DC. Pt continues to plan to go home and not to a SNF at discharge with care from boyfriend and aide who is to begin in one week. Continue to work on discharge needs.     Expected Discharge Plan: [place] Barriers to Discharge: Continued Medical Work up  Expected Discharge Plan and Services Expected Discharge Plan: [place] In-house Referral: Clinical Social Work   Post Acute Care Choice: [place] arrangements for the past 2 months: Apartment                                       Social Determinants of Health (SDOH) Interventions    Readmission Risk Interventions No flowsheet data found.

## 2019-02-28 NOTE — Progress Notes (Signed)
Pt BG 402. Call to Dr Brigitte Pulse, no answer. Attempted to page physician.

## 2019-02-28 NOTE — Progress Notes (Signed)
I have not received any page from nurse, neither any call.  Received secure text and has been appropriately answered.

## 2019-02-28 NOTE — Progress Notes (Signed)
Occupational Therapy Treatment Patient Details Name: Karen [name] MRN: [id number] DOB: 1955-09-01 Today's Date: 02/28/2019    History of present illness Karen [name] is a 37yoF who comes to [facility] on 2/21 c HA, dizziness, imbalance, difficulty speaking. Pt also reports subjective Rt sided weakness. PMH: DM, HTN, ETOH pancreatitis, ETOH abuse in remission, Left frozen shoulder.   OT comments  Karen [name] was seen for OT treatment on this date. Upon arrival to room pt lying in bed reporting 0/10 pain but mild nausea from prior PT session. Pt A&O x 4 and agreeable to tx. Pt instructed in environmental modicifations to minimize dizziness (decrease bright overhead lighting, avoid prolonged TV while at rest, utilizing smaller movements) and energy conservation strategies. Requiring SETUP and close Supervision for grooming ADL tasks seated EOB c feet dangling. After ~8 mins seated EOB ADL tasks pt reported nausea and dizziness was causing an 8/10 frontal headache - RN notified. Pt returned to bed and verbalized improvement c overhead lights off and blinds drawn. Pt verbalized understanding of instruction provided. Pt making good progress toward goals. Pt continues to benefit from skilled OT services to maximize return to PLOF and minimize risk of future falls, injury, caregiver burden, and readmission. Will continue to follow POC. Discharge recommendation remains appropriate.    Follow Up Recommendations  SNF    Equipment Recommendations  Other (comment)(defer to next level of care)    Recommendations for Other Services      Precautions / Restrictions Precautions Precautions: Fall Precaution Comments: *seizure precautions at eval, unclear why Restrictions Weight Bearing Restrictions: No       Mobility Bed Mobility Overal bed mobility: Needs Assistance Bed Mobility: Supine to Sit;Sit to Supine     Supine to sit: Supervision Sit to supine: Supervision   General bed mobility comments: able to sit  tall unsupported at EOB, feet dangling  Transfers Overall transfer level: Needs assistance Equipment used: Rolling walker (2 wheeled);1 person hand held assist Transfers: Sit to/from [facility] Sit to Stand: Min guard Stand pivot transfers: Min assist       General transfer comment: safer with RW, more confident with RW    Balance                                           ADL either performed or assessed with clinical judgement   ADL Overall ADL's : Needs assistance/impaired                                       General ADL Comments: Pt reports PMH of L frozen shoulder stating unable to engage LUE for har brushing - however, pt Independently use BUE to put hair in bun on completion of hair grooming (L shoulder flexion ~90* AROM). Pt limited 2/2 dizziness and nauesa t/o session. Setup + VCs for thoroughness to wash hair with shower cap seated EOB. MIN A comb hair seated EOB - assist for tangles on posterior region of hair. Independently self-drinking seated in bed and at EOB.      Vision       Perception     Praxis      Cognition Arousal/Alertness: Awake/alert Behavior During Therapy: WFL for tasks assessed/performed Overall Cognitive Status: Within Functional Limits for tasks assessed  Exercises Other Exercises Other Exercises: Pt educated re: environmental modicifations to minimize dizziness (decrease bright overhead lighting, avoid prolonged TV while at rest, utilizing smaller movements), energy conservation strategies Other Exercises: Bed mobility, sup<>sit, sitting balance/tolerance, self-drinking, hair washing/combing   Shoulder Instructions       General Comments      Pertinent Vitals/ Pain       Pain Assessment: 0-10 Pain Score: 8  Faces Pain Scale: No hurt Pain Location: frontal headache after ADLs Pain Descriptors / Indicators: Aching Pain  Intervention(s): Limited activity within patient's tolerance;Other (comment)(RN notified)  Home Living                                          Prior Functioning/Environment              Frequency  Min 2X/week        Progress Toward Goals  OT Goals(current goals can now be found in the care plan section)  Progress towards OT goals: Progressing toward goals  Acute Rehab OT Goals Patient Stated Goal: dizziness to stop OT Goal Formulation: With patient Time For Goal Achievement: 03/13/19 Potential to Achieve Goals: Good ADL Goals Pt Will Perform Lower Body Dressing: with min assist;sitting/lateral leans Pt Will Transfer to Toilet: with min assist;stand pivot transfer Pt Will Perform Toileting - Clothing Manipulation and hygiene: with min assist;sitting/lateral leans Additional ADL Goal #1: Patient will tolerate sitting at EOB to complete ADLs for ~3 minutes with CGA.  Plan Discharge plan remains appropriate;Frequency remains appropriate    Co-evaluation                 AM-PAC OT "6 Clicks" Daily Activity     Outcome Measure   Help from another person eating meals?: None Help from another person taking care of personal grooming?: None Help from another person toileting, which includes using toliet, bedpan, or urinal?: A Lot Help from another person bathing (including washing, rinsing, drying)?: A Lot Help from another person to put on and taking off regular upper body clothing?: A Little Help from another person to put on and taking off regular lower body clothing?: A Lot 6 Click Score: 17    End of Session    OT Visit Diagnosis: Muscle weakness (generalized) (M62.81)   Activity Tolerance Patient tolerated treatment well;Other (comment)(limited by nausea and dizziness)   Patient Left in bed;with call bell/phone within reach(bed alarm off at start of session)   Nurse Communication Patient requests pain meds        Time: 1325-1351 OT  Time Calculation (min): 26 min  Charges: OT General Charges $OT Visit: 1 Visit OT Treatments $Self Care/Home Management : 23-37 mins  Dessie Coma, M.S. OTR/L  02/28/19, 2:22 PM

## 2019-03-01 LAB — GLUCOSE, CAPILLARY
Glucose-Capillary: 390 mg/dL — ABNORMAL HIGH (ref 70–99)
Glucose-Capillary: 159 mg/dL — ABNORMAL HIGH (ref 70–99)
Glucose-Capillary: 270 mg/dL — ABNORMAL HIGH (ref 70–99)
Glucose-Capillary: 111 mg/dL — ABNORMAL HIGH (ref 70–99)

## 2019-03-01 LAB — BASIC METABOLIC PANEL
Anion gap: 6 (ref 5–15)
BUN: 30 mg/dL — ABNORMAL HIGH (ref 8–23)
CO2: 25 mmol/L (ref 22–32)
Calcium: 8.3 mg/dL — ABNORMAL LOW (ref 8.9–10.3)
Chloride: 108 mmol/L (ref 98–111)
Creatinine, Ser: 1.55 mg/dL — ABNORMAL HIGH (ref 0.44–1.00)
GFR calc Af Amer: 41 mL/min — ABNORMAL LOW (ref >60)
GFR calc non Af Amer: 36 mL/min — ABNORMAL LOW (ref >60)
Glucose, Bld: 333 mg/dL — ABNORMAL HIGH (ref 70–99)
Potassium: 4.4 mmol/L (ref 3.5–5.1)
Sodium: 139 mmol/L (ref 135–145)

## 2019-03-01 LAB — CBC
HCT: 30.6 % — ABNORMAL LOW (ref 36.0–46.0)
Hemoglobin: 10.2 g/dL — ABNORMAL LOW (ref 12.0–15.0)
MCH: 28.3 pg (ref 26.0–34.0)
MCHC: 33.3 g/dL (ref 30.0–36.0)
MCV: 85 fL (ref 80.0–100.0)
Platelets: 179 10*3/uL (ref 150–400)
RBC: 3.6 MIL/uL — ABNORMAL LOW (ref 3.87–5.11)
RDW: 12.7 % (ref 11.5–15.5)
WBC: 9.3 10*3/uL (ref 4.0–10.5)
nRBC: 0 % (ref 0.0–0.2)

## 2019-03-01 MED ORDER — ZOLPIDEM TARTRATE 5 MG PO TABS
5.0000 mg | ORAL_TABLET | Freq: Every evening | ORAL | Status: DC | PRN
  Administered 2019-03-01 – 2019-03-02 (×2): 5 mg via ORAL
  Filled 2019-03-01 (×2): qty 1

## 2019-03-01 MED ORDER — HYDROCORTISONE 0.5 % EX CREA
1.0000 "application " | TOPICAL_CREAM | Freq: Two times a day (BID) | CUTANEOUS | Status: DC
  Administered 2019-03-01 – 2019-03-03 (×4): 1 via TOPICAL
  Filled 2019-03-01: qty 28.35

## 2019-03-01 MED ORDER — INSULIN ASPART PROT & ASPART (70-30 MIX) 100 UNIT/ML ~~LOC~~ SUSP
20.0000 [IU] | Freq: Two times a day (BID) | SUBCUTANEOUS | Status: DC
  Administered 2019-03-01 – 2019-03-02 (×3): 20 [IU] via SUBCUTANEOUS
  Filled 2019-03-01 (×3): qty 10

## 2019-03-01 NOTE — Progress Notes (Signed)
Bedside shift report received from night shift RN. Morning assessment completed. Pt. Resting in bed, no needs noted at this time. Will continue to monitor.

## 2019-03-01 NOTE — Progress Notes (Signed)
Progress Note    Karen [name]  [id number] DOB: 1955-12-16  DOA: 02/26/2019 PCP: Lavera Guise, MD      Brief Narrative:  Medical records reviewed and are as summarized below:  Karen [name] is an 65 y.o. female with medical history significant for hypertension, type 2 diabetes mellitus, hyperlipidemia, CAD, chronic pancreatitis, history of alcohol use disorder, who presented to the hospital with nausea, headache, dizziness and unsteady gait.  Assessment/Plan:   Active Problems:   ETOH abuse   Hypertension   Type 2 diabetes mellitus with hyperlipidemia (HCC)   GERD (gastroesophageal reflux disease)   Headache   Dizziness   Body mass index is 21.73 kg/m.   Headache and dizziness likely due to BPPV: MRI brain did not show any acute abnormality or stroke.  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 2D echo showed EF estimated at 65 to [id number], grade 1 diastolic dysfunction - continue meclizine, diazepam and Zofran.  Discontinue Plavix.  Patient has no history of stroke.  PT and OT eval for vestibular training and unsteady gait  Hypertension: Resume home amlodipine.  Type II DM with hyperglycemia: Hemoglobin A1c is 11.1 - increase novolog 70/30 to 15 units BID   Hyperlipidemia: Uncontrolled.  Total cholesterol 222, triglycerides 355, LDL 107.  Continue Crestor  Chronic pancreatitis: Continue Creon  History of alcohol use disorder: Patient said she has not drank alcohol in 4 years.  Alcohol level was 15 which was above normal limits.  Acute kidney injury. Likely from hyperglycemia. Will provide aggressive IV hydration and monitor renal function.   Family Communication/Anticipated D/C date and plan/Code Status   DVT prophylaxis: Lovenox Code Status: Full code Family Communication: Plan discussed with patient Disposition Plan: Possible discharge to home in 1 to 2 days once blood sugar better  controlled and symptoms better. Patient refuses STR/SNF   Subjective:  Denies any acute complaint.  Concerned that she is not receiving any medication to help her calm down.  No nausea no vomiting no fever no chills.  No chest pain abdominal pain.  Objective:    Vitals:   03/01/19 0736 03/01/19 0900 03/01/19 1137 03/01/19 1648  BP: (!) 150/70 (!) 129/57 (!) 121/95 127/84  Pulse: 70  81 85  Resp: 17  12 13   Temp: 97.6 F (36.4 C)  98.2 F (36.8 C) 98.3 F (36.8 C)  TempSrc: Oral  Oral Oral  SpO2: 97%  96% 95%  Weight:      Height:        Intake/Output Summary (Last 24 hours) at 03/01/2019 1917 Last data filed at 03/01/2019 1332 Gross per 24 hour  Intake 820 ml  Output -  Net 820 ml   Filed Weights   02/26/19 1140  Weight: 52.2 kg    Exam:  General: alert and oriented to time, place, and person. Appear in mild distress, affect appropriate Eyes: PERRL, Conjunctiva normal ENT: Oral Mucosa Clear, dry  Neck: difficult to assess  JVD, no Abnormal Mass Or lumps Cardiovascular: S1 and S2 Present, no Murmur, peripheral pulses symmetrical Respiratory: good respiratory effort, Bilateral Air entry equal and Decreased, no signs of accessory muscle use, Clear to Auscultation, no Crackles, no wheezes Abdomen: Bowel Sound present, Soft and no tenderness, no hernia Skin: no rashes  Extremities: no Pedal edema, no calf tenderness Neurologic: without any new focal findings no asterixis Gait not checked due to patient safety concerns  Data Reviewed:   I have personally reviewed following labs and imaging studies:  Labs: Labs show the following:   Basic Metabolic Panel: Recent Labs  Lab 02/26/19 1151 02/26/19 1151 02/27/19 0311 02/28/19 0758 03/01/19 0313  NA 139  --  138  --  139  K 4.4   < > 4.3  --  4.4  CL 104  --  105  --  108  CO2 27  --  26  --  25  GLUCOSE 258*  --  342* 408* 333*  BUN 34*  --  28*  --  30*  CREATININE 1.23*  --  1.11*  --  1.55*  CALCIUM  8.9  --  8.7*  --  8.3*   < > = values in this interval not displayed.   GFR Estimated Creatinine Clearance: 28.4 mL/min (A) (by C-G formula based on SCr of 1.55 mg/dL (H)). Liver Function Tests: Recent Labs  Lab 02/26/19 1814 02/27/19 0311  AST 22 18  ALT 39 34  ALKPHOS 293* 271*  BILITOT 0.8 0.5  PROT 7.0 7.2  ALBUMIN 3.5 3.4*   Recent Labs  Lab 02/26/19 1814  LIPASE 55*   No results for input(s): AMMONIA in the last 168 hours. Coagulation profile Recent Labs  Lab 02/26/19 1151  INR 0.9    CBC: Recent Labs  Lab 02/26/19 1151 02/27/19 0311 03/01/19 0313  WBC 8.6 7.2 9.3  NEUTROABS  --  3.2  --   HGB 12.2 10.9* 10.2*  HCT 36.1 32.5* 30.6*  MCV 83.6 84.2 85.0  PLT 194 192 179   Cardiac Enzymes: No results for input(s): CKTOTAL, CKMB, CKMBINDEX, TROPONINI in the last 168 hours. BNP (last 3 results) No results for input(s): PROBNP in the last 8760 hours. CBG: Recent Labs  Lab 02/28/19 1627 02/28/19 2143 03/01/19 0734 03/01/19 1135 03/01/19 1651  GLUCAP 244* 353* 390* 159* 270*   D-Dimer: No results for input(s): DDIMER in the last 72 hours. Hgb A1c: Recent Labs    02/27/19 0311  HGBA1C 11.1*   Lipid Profile: Recent Labs    02/27/19 0311  CHOL 222*  HDL 44  LDLCALC 107*  TRIG 355*  CHOLHDL 5.0   Thyroid function studies: No results for input(s): TSH, T4TOTAL, T3FREE, THYROIDAB in the last 72 hours.  Invalid input(s): FREET3 Anemia work up: No results for input(s): VITAMINB12, FOLATE, FERRITIN, TIBC, IRON, RETICCTPCT in the last 72 hours. Sepsis Labs: Recent Labs  Lab 02/26/19 1151 02/27/19 0311 03/01/19 0313  WBC 8.6 7.2 9.3    Microbiology Recent Results (from the past 240 hour(s))  SARS CORONAVIRUS 2 (TAT 6-24 HRS) Nasopharyngeal Nasopharyngeal Swab     Status: None   Collection Time: 02/26/19 12:11 PM   Specimen: Nasopharyngeal Swab  Result Value Ref Range Status   SARS Coronavirus 2 NEGATIVE NEGATIVE Final    Comment:  (NOTE) SARS-CoV-2 target nucleic acids are NOT DETECTED. The SARS-CoV-2 RNA is generally detectable in upper and lower respiratory specimens during the acute phase of infection. Negative results do not preclude SARS-CoV-2 infection, do not rule out co-infections with other pathogens, and should not be used as the sole basis for treatment or other patient management decisions. Negative results must be combined with clinical observations, patient history, and epidemiological information. The expected result is Negative. Fact Sheet for Patients: SugarRoll.be Fact Sheet for Healthcare Providers: https://www.woods-mathews.com/ This test is not yet approved or cleared by the [place] FDA and  has been authorized for detection and/or diagnosis  of SARS-CoV-2 by FDA under an Emergency Use Authorization (EUA). This EUA will remain  in effect (meaning this test can be used) for the duration of the COVID-19 declaration under Section 56 4(b)(1) of the Act, 21 U.S.C. section 360bbb-3(b)(1), unless the authorization is terminated or revoked sooner. Performed at [facility], [place] [address] [place], [place] [address]     Procedures and diagnostic studies:  No results found.  Medications:   . amLODipine  10 mg Oral Daily  . docusate sodium  200 mg Oral BID  . heparin  5,000 Units Subcutaneous Q8H  . HYDROcodone-acetaminophen  1 tablet Oral QID  . hydrocortisone cream  1 application Topical BID  . insulin aspart  0-5 Units Subcutaneous QHS  . insulin aspart  0-9 Units Subcutaneous TID WC  . insulin aspart protamine- aspart  20 Units Subcutaneous BID WC  . lipase/protease/amylase  72,000 Units Oral TID WC  . pantoprazole  40 mg Oral Daily  . pregabalin  150 mg Oral BID  . rosuvastatin  20 mg Oral Daily   Continuous Infusions: . sodium chloride 125 mL/hr at 03/01/19 1748     LOS: 3 days   Berle Mull  [facility]      03/01/2019, 7:17 PM

## 2019-03-01 NOTE — Progress Notes (Addendum)
Physical Therapy Treatment Patient Details Name: Karen [name] MRN: [id number] DOB: May 20, 1955 Today's Date: 03/01/2019    History of Present Illness Karen [name] is a 89yoF who comes to [facility] on 2/21 c HA, dizziness, imbalance, difficulty speaking. Pt also reports subjective Rt sided weakness. PMH: DM, HTN, ETOH pancreatitis, ETOH abuse in remission, Left frozen shoulder.    PT Comments    Pt resting in bed upon entry, agreeable to participate. Reports AMB to BR with nursing earlier without physical assistance using IV pole for stability. Pt reports no pain, but endorses 2 periods of dizziness with standing earlier today but doesn't provide high level detail when probed. Pt able to progress slow AMB >23ft c RW this session, slow and steady, no wide based ataxia. Pt has some intermittent dizziness during final 161ft, no visual changes, occulomotor screening WNL. Pt denies any recent falls or head trauma. Pt continues to progress in all areas of basic mobility. Ability to provide detail remains a major limitation in establishing a detailed HPI regarding dizziness, although I suspect some language barrier/education level may also be interfering. Pt's dizziness report is incongruent with typical presentation of BBPV and orthostatic dizziness, however less common central/peripheral vertigos cannot be ruled out at this time. Symptoms are not inconsistent with with post concussive dizziness, cervicogenic dizziness, and VBI related dizziness, however screening questioning is unfruitful. Fortunately dizziness has not been shown to impact safety with mobility hitherto, however it does result in some movement apprehension. Of note, CHL shows several head CT, brain MRI over the past 5 years for the complaint of dizziness and/or headache, sometimes associated with N/V, none of which have revealed any clear etiology. Will continue to follow.   *addendum: further review of notes from [facility] visit in Jan 2021 reveal nearly  identical HPI at that encounter HA c associated visual changes, paresthesias on right, instability of gait, dysarthria, neurology attributing to migraine.       Follow Up Recommendations  Home health PT     Equipment Recommendations  Rolling walker with 5" wheels    Recommendations for Other Services       Precautions / Restrictions Precautions Precautions: Fall Restrictions Weight Bearing Restrictions: No    Mobility  Bed Mobility Overal bed mobility: Modified Independent       Supine to sit: Modified independent (Device/Increase time) Sit to supine: Modified independent (Device/Increase time)      Transfers Overall transfer level: Modified independent Equipment used: Rolling walker (2 wheeled)   Sit to Stand: Modified independent (Device/Increase time)            Ambulation/Gait   Gait Distance (Feet): 270 Feet Assistive device: Rolling walker (2 wheeled) Gait Pattern/deviations: Step-to pattern Gait velocity: 0.33m/s   General Gait Details: slow, able to AMB last 26ft without assistive device or UE support; stops intermitently after 198ft with dizziness complaint. No changes to visual screening.   Stairs             Wheelchair Mobility    Modified Rankin (Stroke Patients Only)       Balance Overall balance assessment: Needs assistance Sitting-balance support: No upper extremity supported;Feet unsupported Sitting balance-Leahy Scale: Normal     Standing balance support: During functional activity;No upper extremity supported Standing balance-Leahy Scale: Good Standing balance comment: should use RW for longer distance AMB                            Cognition Arousal/Alertness:  Awake/alert Behavior During Therapy: WFL for tasks assessed/performed Overall Cognitive Status: Within Functional Limits for tasks assessed                                        Exercises      General Comments         Pertinent Vitals/Pain Pain Assessment: No/denies pain Pain Location: HA at end of session, top of head;  does not rate Pain Descriptors / Indicators: Aching Pain Intervention(s): Limited activity within patient's tolerance;Monitored during session;Premedicated before session    Home Living                      Prior Function            PT Goals (current goals can now be found in the care plan section) Acute Rehab PT Goals Patient Stated Goal: dizziness to stop PT Goal Formulation: With patient Time For Goal Achievement: 03/13/19 Potential to Achieve Goals: Fair Progress towards PT goals: Progressing toward goals    Frequency    Min 2X/week      PT Plan Discharge plan needs to be updated    Co-evaluation              AM-PAC PT "6 Clicks" Mobility   Outcome Measure  Help needed turning from your back to your side while in a flat bed without using bedrails?: A Little Help needed moving from lying on your back to sitting on the side of a flat bed without using bedrails?: A Little Help needed moving to and from a bed to a chair (including a wheelchair)?: A Little Help needed standing up from a chair using your arms (e.g., wheelchair or bedside chair)?: A Little Help needed to walk in hospital room?: A Little Help needed climbing 3-5 steps with a railing? : A Lot 6 Click Score: 17    End of Session Equipment Utilized During Treatment: Gait belt Activity Tolerance: Patient tolerated treatment well;No increased pain Patient left: in bed;with bed alarm set;with call bell/phone within reach Nurse Communication: Mobility status PT Visit Diagnosis: Difficulty in walking, not elsewhere classified (R26.2);Other abnormalities of gait and mobility (R26.89);Muscle weakness (generalized) (M62.81);Dizziness and giddiness (R42)     Time: [id number] PT Time Calculation (min) (ACUTE ONLY): 14 min  Charges:  $Therapeutic Exercise: 8-22 mins                4:11 PM,  03/01/19 Etta Grandchild, PT, DPT Physical Therapist - [facility]  [phone] ([place])    Mykeria Garman C 03/01/2019, 4:01 PM

## 2019-03-02 ENCOUNTER — Inpatient Hospital Stay: Payer: Medicaid Other

## 2019-03-02 LAB — CBC
HCT: 29.6 % — ABNORMAL LOW (ref 36.0–46.0)
Hemoglobin: 9.8 g/dL — ABNORMAL LOW (ref 12.0–15.0)
MCH: 28.2 pg (ref 26.0–34.0)
MCHC: 33.1 g/dL (ref 30.0–36.0)
MCV: 85.3 fL (ref 80.0–100.0)
Platelets: 157 10*3/uL (ref 150–400)
RBC: 3.47 MIL/uL — ABNORMAL LOW (ref 3.87–5.11)
RDW: 12.9 % (ref 11.5–15.5)
WBC: 9.1 10*3/uL (ref 4.0–10.5)
nRBC: 0 % (ref 0.0–0.2)

## 2019-03-02 LAB — RENAL FUNCTION PANEL
Albumin: 2.8 g/dL — ABNORMAL LOW (ref 3.5–5.0)
Anion gap: 9 (ref 5–15)
BUN: 29 mg/dL — ABNORMAL HIGH (ref 8–23)
CO2: 21 mmol/L — ABNORMAL LOW (ref 22–32)
Calcium: 8.1 mg/dL — ABNORMAL LOW (ref 8.9–10.3)
Chloride: 108 mmol/L (ref 98–111)
Creatinine, Ser: 1.4 mg/dL — ABNORMAL HIGH (ref 0.44–1.00)
GFR calc Af Amer: 47 mL/min — ABNORMAL LOW (ref >60)
GFR calc non Af Amer: 40 mL/min — ABNORMAL LOW (ref >60)
Glucose, Bld: 284 mg/dL — ABNORMAL HIGH (ref 70–99)
Phosphorus: 4.2 mg/dL (ref 2.5–4.6)
Potassium: 4.5 mmol/L (ref 3.5–5.1)
Sodium: 138 mmol/L (ref 135–145)

## 2019-03-02 LAB — GLUCOSE, CAPILLARY
Glucose-Capillary: 251 mg/dL — ABNORMAL HIGH (ref 70–99)
Glucose-Capillary: 240 mg/dL — ABNORMAL HIGH (ref 70–99)
Glucose-Capillary: 196 mg/dL — ABNORMAL HIGH (ref 70–99)
Glucose-Capillary: 236 mg/dL — ABNORMAL HIGH (ref 70–99)

## 2019-03-02 LAB — MAGNESIUM: Magnesium: 2 mg/dL (ref 1.7–2.4)

## 2019-03-02 MED ORDER — BUTALBITAL-APAP-CAFFEINE 50-325-40 MG PO TABS
1.0000 | ORAL_TABLET | Freq: Four times a day (QID) | ORAL | Status: DC | PRN
  Administered 2019-03-02 – 2019-03-03 (×2): 1 via ORAL
  Filled 2019-03-02 (×3): qty 1

## 2019-03-02 MED ORDER — SENNOSIDES-DOCUSATE SODIUM 8.6-50 MG PO TABS
1.0000 | ORAL_TABLET | Freq: Two times a day (BID) | ORAL | Status: DC
  Administered 2019-03-02: 1 via ORAL
  Filled 2019-03-02: qty 1

## 2019-03-02 MED ORDER — INSULIN ASPART 100 UNIT/ML ~~LOC~~ SOLN
4.0000 [IU] | Freq: Three times a day (TID) | SUBCUTANEOUS | Status: DC
  Administered 2019-03-02 – 2019-03-03 (×4): 4 [IU] via SUBCUTANEOUS
  Filled 2019-03-02 (×4): qty 1

## 2019-03-02 MED ORDER — POLYETHYLENE GLYCOL 3350 17 G PO PACK
17.0000 g | PACK | Freq: Every day | ORAL | Status: DC
  Administered 2019-03-02 – 2019-03-03 (×2): 17 g via ORAL
  Filled 2019-03-02 (×2): qty 1

## 2019-03-02 MED ORDER — SALINE SPRAY 0.65 % NA SOLN
2.0000 | Freq: Four times a day (QID) | NASAL | Status: DC
  Administered 2019-03-02 – 2019-03-03 (×3): 2 via NASAL
  Filled 2019-03-02: qty 44

## 2019-03-02 MED ORDER — LACTULOSE 10 GM/15ML PO SOLN
20.0000 g | Freq: Once | ORAL | Status: AC
  Administered 2019-03-02: 12:00:00 20 g via ORAL
  Filled 2019-03-02: qty 30

## 2019-03-02 MED ORDER — INSULIN GLARGINE 100 UNIT/ML ~~LOC~~ SOLN
20.0000 [IU] | Freq: Every day | SUBCUTANEOUS | Status: DC
  Administered 2019-03-02: 20 [IU] via SUBCUTANEOUS
  Filled 2019-03-02 (×2): qty 0.2

## 2019-03-02 MED ORDER — FLUTICASONE PROPIONATE 50 MCG/ACT NA SUSP
2.0000 | Freq: Every day | NASAL | Status: DC
  Administered 2019-03-02 – 2019-03-03 (×2): 2 via NASAL
  Filled 2019-03-02: qty 16

## 2019-03-02 MED ORDER — SENNOSIDES-DOCUSATE SODIUM 8.6-50 MG PO TABS
2.0000 | ORAL_TABLET | Freq: Two times a day (BID) | ORAL | Status: DC
  Administered 2019-03-02 – 2019-03-03 (×2): 2 via ORAL
  Filled 2019-03-02 (×2): qty 2

## 2019-03-02 NOTE — Progress Notes (Addendum)
Inpatient Diabetes Program Recommendations  AACE/ADA: New Consensus Statement on Inpatient Glycemic Control (2015)  Target Ranges:  Prepandial:   less than 140 mg/dL      Peak postprandial:   less than 180 mg/dL (1-2 hours)      Critically ill patients:  140 - 180 mg/dL   Lab Results  Component Value Date   GLUCAP 251 (H) 03/02/2019   HGBA1C 11.1 (H) 02/27/2019    Review of Glycemic Control Results for Karen [name], Karen [name] (MRN [id number]) as of 03/02/2019 08:50  Ref. Range 03/01/2019 07:34 03/01/2019 11:35 03/01/2019 16:51 03/01/2019 21:53 03/02/2019 07:50  Glucose-Capillary Latest Ref Range: 70 - 99 mg/dL 390 (H) 159 (H) 270 (H) 111 (H) 251 (H)   Diabetes history: DM 2 Outpatient Diabetes medications: Novolog 70/30 15 units bid Current orders for Inpatient glycemic control:  70/30 20 units bid Novolog 0-9 units tid + hs  Glucose trends better with increase in 70/30 dose. Glucose trends improve after insulin dose, however it rebounds back up when next dose is due.  Note pt has medicaid listed in chart.   Inpatient Diabetes Program Recommendations:    Glucose levels may respond better and stabilize out on basal bolus regimen at home. If pt has medicaid should not have a problem getting her insulins. Will discuss with pt to see if she is agreeable.  Spoke with pt regarding basal bolus regimen at home. Pt is agreeable and would like to start while here in the hospital prior to d/c. Spoke with Dr. Posey Pronto about plan of care.   Lantus Solostar insulin pen order # [id number] Insulin pen needles order # [id number] Novolog Flex pen order # [id number]  Thanks,  Tama Headings RN, MSN, BC-ADM Inpatient Diabetes Coordinator Team Pager [phone] (8a-5p)

## 2019-03-02 NOTE — Progress Notes (Signed)
PT Cancellation Note  Patient Details Name: Karen [name] MRN: [id number] DOB: 07-Mar-1955   Cancelled Treatment:    Reason Eval/Treat Not Completed: Patient declined, no reason specified  Pt eating lunch when PT came into room. She expressed that she had been up to the chair and has been moving around her room. Will re-attempt PT treatment when patient is available.   Yoshua Geisinger PT, DPT 03/02/2019, 11:59 AM

## 2019-03-02 NOTE — TOC Progression Note (Signed)
Transition of Care [facility]) - Progression Note    Patient Details  Name: Karen [name] MRN: [id number] Date of Birth: 1955/08/04  Transition of Care [facility]) CM/SW Contact  Acel Natzke, Gardiner Rhyme, LCSW Phone Number: 03/02/2019, 1:45 PM  Clinical Narrative:  Pt feels better today and is moving better according to pt, although she has refused PT session earlier today. She has a rw at home and feels she has no other equipment needs. Continue to work on discharge needs. Jaconita to provide follow up when medically ready to DC home.     Expected Discharge Plan: [place] Barriers to Discharge: Continued Medical Work up  Expected Discharge Plan and Services Expected Discharge Plan: [place] In-house Referral: Clinical Social Work   Post Acute Care Choice: [place] arrangements for the past 2 months: Apartment                                       Social Determinants of Health (SDOH) Interventions    Readmission Risk Interventions No flowsheet data found.

## 2019-03-02 NOTE — Progress Notes (Signed)
Progress Note    Karen [name]  [id number] DOB: [birth year]  DOA: 02/26/2019 PCP: Lavera Guise, MD      Brief Narrative:  Medical records reviewed and are as summarized below:  Karen [name] is an 64 y.o. female with medical history significant for hypertension, type 2 diabetes mellitus, hyperlipidemia, CAD, chronic pancreatitis, history of alcohol use disorder, who presented to the hospital with nausea, headache, dizziness and unsteady gait.  Assessment/Plan:   Active Problems:   ETOH abuse   Hypertension   Type 2 diabetes mellitus with hyperlipidemia (HCC)   GERD (gastroesophageal reflux disease)   Headache   Dizziness   Body mass index is 21.73 kg/m.   Headache and dizziness likely due to BPPV:  MRI brain did not show any acute abnormality or stroke.  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 2D echo showed EF estimated at 65 to [id number], grade 1 diastolic dysfunction Treated with meclizine, diazepam and Zofran.  Currently on scheduled Norco which is patient's home medication as well as as needed Fioricet. Added Flonase as the patient has primarily frontal headache complaint. Do not think the further increase in medicines is required to treat patient's chronic cardiac. Discontinue Plavix.  Patient has no history of stroke.  PT and OT eval for vestibular training and unsteady gait  Hypertension: Patient was on amlodipine.  Currently on hold due to hypotension and AKI  Type II DM with hyperglycemia:  Hemoglobin A1c is 11.1 increase novolog 70/30 to 20 units BID.  After that patient had a discussion with diabetes coordinator and patient would like to start taking Lantus and bolus regimen. Medication ordered. Patient is comfortable using Pen on discharge.  Hyperlipidemia: Uncontrolled.   Total cholesterol 222, triglycerides 355, LDL 107.  Continue Crestor  Chronic pancreatitis:  Continue  Creon  History of alcohol use disorder:  Patient said she has not drank alcohol in 4 years.  Alcohol level was 15 which was above normal limits.  Acute kidney injury. Likely from hyperglycemia. Will provide aggressive IV hydration and monitor renal function.  Constipation. X-ray abdomen shows significant bowel burden. Continue bowel regimen.   Family Communication/Anticipated D/C date and plan/Code Status   DVT prophylaxis: Lovenox Code Status: Full code Family Communication: Plan discussed with patient Disposition Plan: Possible discharge to home in 1 to 2 days once blood sugar better controlled and symptoms better. Patient refuses STR/SNF   Subjective:  Continues to have headache.  Which is reported in the frontal region.  No focal deficit.  Feels tired and fatigued.  Also reports constipation.  Objective:    Vitals:   03/01/19 2356 03/02/19 0325 03/02/19 0749 03/02/19 1555  BP: 125/71 (!) 148/74 (!) 156/84 (!) 146/78  Pulse: 69 72 73 73  Resp: 15 15 18 18   Temp: 98.3 F (36.8 C) 97.9 F (36.6 C) 98.5 F (36.9 C) 97.8 F (36.6 C)  TempSrc: Oral Oral Oral Oral  SpO2: 97% 100% 98% 98%  Weight:      Height:       No intake or output data in the 24 hours ending 03/02/19 1826 Filed Weights   02/26/19 1140  Weight: 52.2 kg    Exam: General: Appear in mild distress, no Rash; Oral Mucosa Clear, moist. no Abnormal Neck Mass Or lumps, Conjunctiva noraml  Cardiovascular: S1 and S2 Present, no Murmur, Respiratory: normal respiratory effort, Bilateral Air entry present and Clear to  Auscultation, no Crackles, no wheezes Abdomen: Bowel Sound present, Soft and no tenderness, no hernia Extremities: no Pedal edema, no calf tenderness Neurology: alert and oriented to time, place, and person affect flat.    Data Reviewed:   I have personally reviewed following labs and imaging studies:  Labs: Labs show the following:   Basic Metabolic Panel: Recent Labs  Lab  02/26/19 1151 02/26/19 1151 02/27/19 0311 02/27/19 0311 02/28/19 0758 03/01/19 0313 03/02/19 0316  NA 139  --  138  --   --  139 138  K 4.4   < > 4.3   < >  --  4.4 4.5  CL 104  --  105  --   --  108 108  CO2 27  --  26  --   --  25 21*  GLUCOSE 258*  --  342*  --  408* 333* 284*  BUN 34*  --  28*  --   --  30* 29*  CREATININE 1.23*  --  1.11*  --   --  1.55* 1.40*  CALCIUM 8.9  --  8.7*  --   --  8.3* 8.1*  MG  --   --   --   --   --   --  2.0  PHOS  --   --   --   --   --   --  4.2   < > = values in this interval not displayed.   GFR Estimated Creatinine Clearance: 31.4 mL/min (A) (by C-G formula based on SCr of 1.4 mg/dL (H)). Liver Function Tests: Recent Labs  Lab 02/26/19 1814 02/27/19 0311 03/02/19 0316  AST 22 18  --   ALT 39 34  --   ALKPHOS 293* 271*  --   BILITOT 0.8 0.5  --   PROT 7.0 7.2  --   ALBUMIN 3.5 3.4* 2.8*   Recent Labs  Lab 02/26/19 1814  LIPASE 55*   No results for input(s): AMMONIA in the last 168 hours. Coagulation profile Recent Labs  Lab 02/26/19 1151  INR 0.9    CBC: Recent Labs  Lab 02/26/19 1151 02/27/19 0311 03/01/19 0313 03/02/19 0316  WBC 8.6 7.2 9.3 9.1  NEUTROABS  --  3.2  --   --   HGB 12.2 10.9* 10.2* 9.8*  HCT 36.1 32.5* 30.6* 29.6*  MCV 83.6 84.2 85.0 85.3  PLT 194 192 179 157   Cardiac Enzymes: No results for input(s): CKTOTAL, CKMB, CKMBINDEX, TROPONINI in the last 168 hours. BNP (last 3 results) No results for input(s): PROBNP in the last 8760 hours. CBG: Recent Labs  Lab 03/01/19 1651 03/01/19 2153 03/02/19 0750 03/02/19 1142 03/02/19 1630  GLUCAP 270* 111* 251* 240* 196*   D-Dimer: No results for input(s): DDIMER in the last 72 hours. Hgb A1c: No results for input(s): HGBA1C in the last 72 hours. Lipid Profile: No results for input(s): CHOL, HDL, LDLCALC, TRIG, CHOLHDL, LDLDIRECT in the last 72 hours. Thyroid function studies: No results for input(s): TSH, T4TOTAL, T3FREE, THYROIDAB in the  last 72 hours.  Invalid input(s): FREET3 Anemia work up: No results for input(s): VITAMINB12, FOLATE, FERRITIN, TIBC, IRON, RETICCTPCT in the last 72 hours. Sepsis Labs: Recent Labs  Lab 02/26/19 1151 02/27/19 0311 03/01/19 0313 03/02/19 0316  WBC 8.6 7.2 9.3 9.1    Microbiology Recent Results (from the past 240 hour(s))  SARS CORONAVIRUS 2 (TAT 6-24 HRS) Nasopharyngeal Nasopharyngeal Swab     Status: None   Collection  Time: 02/26/19 12:11 PM   Specimen: Nasopharyngeal Swab  Result Value Ref Range Status   SARS Coronavirus 2 NEGATIVE NEGATIVE Final    Comment: (NOTE) SARS-CoV-2 target nucleic acids are NOT DETECTED. The SARS-CoV-2 RNA is generally detectable in upper and lower respiratory specimens during the acute phase of infection. Negative results do not preclude SARS-CoV-2 infection, do not rule out co-infections with other pathogens, and should not be used as the sole basis for treatment or other patient management decisions. Negative results must be combined with clinical observations, patient history, and epidemiological information. The expected result is Negative. Fact Sheet for Patients: SugarRoll.be Fact Sheet for Healthcare Providers: https://www.woods-mathews.com/ This test is not yet approved or cleared by the [place] FDA and  has been authorized for detection and/or diagnosis of SARS-CoV-2 by FDA under an Emergency Use Authorization (EUA). This EUA will remain  in effect (meaning this test can be used) for the duration of the COVID-19 declaration under Section 56 4(b)(1) of the Act, 21 U.S.C. section 360bbb-3(b)(1), unless the authorization is terminated or revoked sooner. Performed at [facility], [place] [address] [place], [place] [address]     Procedures and diagnostic studies:  DG Abd Portable 1V  Result Date: 03/02/2019 CLINICAL DATA:  Constipation, abdominal pain and distention. EXAM: PORTABLE  ABDOMEN - 1 VIEW COMPARISON:  January 30, 2019. FINDINGS: No abnormal bowel dilatation is noted. Large amount of stool seen throughout the colon. No radio-opaque calculi or other significant radiographic abnormality are seen. IMPRESSION: Large stool burden is noted. No abnormal bowel dilatation is noted. Electronically Signed   By: Marijo Conception M.D.   On: 03/02/2019 10:22    Medications:   . fluticasone  2 spray Each Nare Daily  . heparin  5,000 Units Subcutaneous Q8H  . HYDROcodone-acetaminophen  1 tablet Oral QID  . hydrocortisone cream  1 application Topical BID  . insulin aspart  0-5 Units Subcutaneous QHS  . insulin aspart  0-9 Units Subcutaneous TID WC  . insulin aspart  4 Units Subcutaneous TID WC  . insulin glargine  20 Units Subcutaneous QHS  . lipase/protease/amylase  72,000 Units Oral TID WC  . pantoprazole  40 mg Oral Daily  . polyethylene glycol  17 g Oral Daily  . pregabalin  150 mg Oral BID  . rosuvastatin  20 mg Oral Daily  . senna-docusate  2 tablet Oral BID  . sodium chloride  2 spray Each Nare 4x daily   Continuous Infusions: . sodium chloride 50 mL/hr at 03/02/19 1210     LOS: 4 days   Berle Mull  [facility]     03/02/2019, 6:26 PM

## 2019-03-03 DIAGNOSIS — H8110 Benign paroxysmal vertigo, unspecified ear: Principal | ICD-10-CM

## 2019-03-03 LAB — RENAL FUNCTION PANEL
Albumin: 3.2 g/dL — ABNORMAL LOW (ref 3.5–5.0)
Anion gap: 8 (ref 5–15)
BUN: 32 mg/dL — ABNORMAL HIGH (ref 8–23)
CO2: 25 mmol/L (ref 22–32)
Calcium: 8.8 mg/dL — ABNORMAL LOW (ref 8.9–10.3)
Chloride: 105 mmol/L (ref 98–111)
Creatinine, Ser: 1.3 mg/dL — ABNORMAL HIGH (ref 0.44–1.00)
GFR calc Af Amer: 51 mL/min — ABNORMAL LOW (ref >60)
GFR calc non Af Amer: 44 mL/min — ABNORMAL LOW (ref >60)
Glucose, Bld: 291 mg/dL — ABNORMAL HIGH (ref 70–99)
Phosphorus: 4.5 mg/dL (ref 2.5–4.6)
Potassium: 4 mmol/L (ref 3.5–5.1)
Sodium: 138 mmol/L (ref 135–145)

## 2019-03-03 LAB — CBC
HCT: 31.8 % — ABNORMAL LOW (ref 36.0–46.0)
Hemoglobin: 10.4 g/dL — ABNORMAL LOW (ref 12.0–15.0)
MCH: 28.2 pg (ref 26.0–34.0)
MCHC: 32.7 g/dL (ref 30.0–36.0)
MCV: 86.2 fL (ref 80.0–100.0)
Platelets: 167 10*3/uL (ref 150–400)
RBC: 3.69 MIL/uL — ABNORMAL LOW (ref 3.87–5.11)
RDW: 13.1 % (ref 11.5–15.5)
WBC: 8.1 10*3/uL (ref 4.0–10.5)
nRBC: 0 % (ref 0.0–0.2)

## 2019-03-03 LAB — GLUCOSE, CAPILLARY
Glucose-Capillary: 219 mg/dL — ABNORMAL HIGH (ref 70–99)
Glucose-Capillary: 179 mg/dL — ABNORMAL HIGH (ref 70–99)

## 2019-03-03 MED ORDER — ZOLPIDEM TARTRATE 5 MG PO TABS: 5.0000 mg | ORAL_TABLET | Freq: Every evening | ORAL | 0 refills | Status: DC | PRN

## 2019-03-03 MED ORDER — HYDROCODONE-ACETAMINOPHEN 10-325 MG PO TABS: 1.0000 | ORAL_TABLET | Freq: Four times a day (QID) | ORAL | 0 refills | Status: AC | PRN

## 2019-03-03 NOTE — Discharge Summary (Signed)
Physician Discharge Summary  Karen [name]:[id number] DOB: 01-04-1956 DOA: 02/26/2019  PCP: Lavera Guise, MD  Admit date: 02/26/2019 Discharge date: 03/03/2019  Admitted From: home Disposition: home w/ home health  Recommendations for Outpatient Follow-up:  1. Follow up with PCP in 1-2 weeks 2.   Home Health: yes Equipment/Devices:  Discharge Condition: stable CODE STATUS: full  Diet recommendation: Heart Healthy / Carb Modified  Brief/Interim Summary: HPI was taken from Dr. Girard Cooter: Karen [name] is a 64 y.o. female with medical history significant of diabetes, hypertension, alcoholic pancreatitis who comes to the ED due to bilateral headache and dizziness that she first noticed this morning when waking up. Also feels off balance and having difficulty speaking. Also feels weak on the right side. No vision change.  Pt reports dizziness on getting up.otherwise no complaints of chest pain or sob.  ED Course:  Blood pressure (!) 154/79, pulse 76, temperature 98.5 F (36.9 C), temperature source Oral, resp. rate 20, height 5' 1"  (1.549 m), weight 52.2 kg, SpO2 97 %.  Pt is alert,awake and oriented. Initial labs show elevated glucose , creatinine of 1.23 with egfr of 47,normal cbc.  Initial covid-19 pending. Initial head CT is negative.  Serum alcohol level is more than 15.  Hospital Course from Dr. Lenise Herald 03/03/19: Pt was found to have likely  BPPV which was treated w/ meclizine, diazepam, zofran, & flonase. These improved the pt's symptoms but did not completely resolve the pt's symptoms. PT/OT saw the pt and recommended home health which was set prior to d/c by CM. For more information, please refer to previous progress notes.   Discharge Diagnoses:  Active Problems:   ETOH abuse   Hypertension   Type 2 diabetes mellitus with hyperlipidemia (HCC)   GERD (gastroesophageal reflux disease)   Headache   Dizziness  Possible BPPV: MRI brain did not show any acute  abnormality or stroke.  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 Echo showed EF estimated at 65 to 93%, grade 1 diastolic dysfunction. Continue w/ meclizine, diazepam, zofran.Currently on scheduled Norco as well as as needed Fioricet. Continue on flonase as the patient has primarily frontal headache complaint. PT and OT eval for vestibular training and unsteady gait  Hypertension: continue to hold amlodipine.   DM2 with hyperglycemia: HbA1c is 11.1. Continue on lantus, aspart w/ meals & SSI w/ accuchecks. Carb modified diet   Hyperlipidemia: continue Crestor  Chronic pancreatitis: continue creon  History of alcohol use disorder: not drank alcohol in 4 years.  Alcohol level was 15 which was above normal limits.  AKI: Cr is improving w/ IVFs. Will continue to monitor   Constipation: continue on miralax  Discharge Instructions  Discharge Instructions    Diet - low sodium heart healthy   Complete by: As directed    Discharge instructions   Complete by: As directed    F/u PCP in 1-2 weeks   Increase activity slowly   Complete by: As directed      Allergies as of 03/03/2019      Reactions   Penicillins Anaphylaxis   Has patient had a PCN reaction causing immediate rash, facial/tongue/throat swelling, SOB or lightheadedness with hypotension: Yes Has patient had a PCN reaction causing severe rash involving mucus membranes or skin necrosis: No Has patient had a PCN reaction that required hospitalization No Has patient had a PCN reaction occurring within the last 10 years:  No If all of the above answers are "NO", then may proceed with Cephalosporin use.   Reglan [metoclopramide] Other (See Comments)   Hypotension, shortness of breath   Percocet [oxycodone-acetaminophen] Rash      Medication List    TAKE these medications   amLODipine 10 MG tablet Commonly known as: NORVASC Take 10 mg by mouth  daily. Notes to patient: Last dose given yesterday at 8:14 AM   HYDROcodone-acetaminophen 10-325 MG tablet Commonly known as: NORCO Take 1 tablet by mouth every 6 (six) hours as needed for up to 5 days for moderate pain or severe pain. What changed:   when to take this  reasons to take this   insulin aspart 100 UNIT/ML injection Commonly known as: novoLOG Inject 4 Units into the skin 3 (three) times daily with meals. What changed:   how much to take  when to take this Notes to patient: Last dose given today at 9:00 AM   lipase/protease/amylase 36000 UNITS Cpep capsule Commonly known as: Creon Take 2 capsules (72,000 Units total) by mouth 3 (three) times daily with meals. Notes to patient: Last dose given today at 9:03 AM   nystatin powder Commonly known as: MYCOSTATIN/NYSTOP Apply topically 2 (two) times daily. Notes to patient: Not given in hospital.   omeprazole 40 MG capsule Commonly known as: PRILOSEC Take 1 capsule (40 mg total) by mouth 2 (two) times daily for 14 days. Notes to patient: Not given in hospital   pregabalin 150 MG capsule Commonly known as: LYRICA Take 150 mg by mouth 2 (two) times daily. Notes to patient: Last dose given today at 9:00 AM   rosuvastatin 20 MG tablet Commonly known as: CRESTOR Take 20 mg by mouth daily. Notes to patient: Last dose given today at 9:00 AM   zolpidem 5 MG tablet Commonly known as: AMBIEN Take 1 tablet (5 mg total) by mouth at bedtime as needed for up to 5 days for sleep.      Follow-up Information    Lavera Guise, MD Follow up.   Specialty: Internal Medicine Contact information: [address] [place] [address] [phone]          Allergies  Allergen Reactions  . Penicillins Anaphylaxis    Has patient had a PCN reaction causing immediate rash, facial/tongue/throat swelling, SOB or lightheadedness with hypotension: Yes Has patient had a PCN reaction causing severe rash involving mucus  membranes or skin necrosis: No Has patient had a PCN reaction that required hospitalization No Has patient had a PCN reaction occurring within the last 10 years: No If all of the above answers are "NO", then may proceed with Cephalosporin use.  . Reglan [Metoclopramide] Other (See Comments)    Hypotension, shortness of breath  . Percocet [Oxycodone-Acetaminophen] Rash    Consultations:  n/a   Procedures/Studies: CT HEAD WO CONTRAST  Result Date: 02/26/2019 CLINICAL DATA:  Headache, dizziness EXAM: CT HEAD WITHOUT CONTRAST TECHNIQUE: Contiguous axial images were obtained from the base of the skull through the vertex without intravenous contrast. COMPARISON:  01/24/2019 FINDINGS: Brain: No evidence of acute infarction, hemorrhage, hydrocephalus, extra-axial collection or mass lesion/mass effect. Vascular: Intracranial atherosclerosis. Skull: Normal. Negative for fracture or focal lesion. Sinuses/Orbits: The visualized paranasal sinuses are essentially clear. The mastoid air cells are unopacified. Other: None. IMPRESSION: Normal head CT. Electronically Signed   By: Julian Hy M.D.   On: 02/26/2019 13:04   MR ANGIO HEAD WO CONTRAST  Result Date: 02/26/2019 CLINICAL DATA:  64 year old female  with headache and dizziness. EXAM: MRA HEAD WITHOUT CONTRAST TECHNIQUE: Angiographic images of the Circle of Willis were obtained using MRA technique without intravenous contrast. COMPARISON:  Brain MRI and neck MRA today.  Neck CT 12/19/2018. FINDINGS: Antegrade flow in the posterior circulation. Irregularity of the right vertebral artery V4 segment corresponds to calcified plaque seen on the CT in December. However, only mild stenosis is suggested by MRA. The right PICA origin remains patent. The more dominant appearing left V4 segment appears normal to the vertebrobasilar junction. Left PICA origin is patent. Patent basilar artery. Patent SCA and PCA origins. Posterior communicating arteries are  diminutive or absent. Right PCA branches are within normal limits. There is mild to moderate left PCA P2 segment irregularity and stenosis on series 1045 image 5. Symmetric distal PCA flow signal. Antegrade flow in both ICA siphons. The siphons were mostly not included in December. There is moderate to severe signal loss at the left anterior genu (series 1029, image 13). And similar contralateral right supraclinoid segment irregularity and stenosis (same image). Both carotid termini remain normal MCA patent. And ACA origins. Anterior communicating artery and visible ACA branches are within normal limits. Right MCA M1 segment and bifurcation appears patent without definite stenosis. The left MCA bifurcates early, and source images do not suggest stenosis which is questionable on the MIP images. Visible left MCA branches are within normal limits. IMPRESSION: 1. Positive for intracranial atherosclerosis with at least moderate stenosis of both ICA siphons and the left PCA P2. 2. No circle-of-Willis aneurysm or branch occlusion identified. Electronically Signed   By: Genevie Ann M.D.   On: 02/26/2019 16:46   MR Angiogram Neck W or Wo Contrast  Result Date: 02/26/2019 CLINICAL DATA:  64 year old female with headache and dizziness. EXAM: MRA NECK WITHOUT AND WITH CONTRAST TECHNIQUE: Multiplanar and multiecho pulse sequences of the neck were obtained without and with intravenous contrast. Angiographic images of the neck were obtained using MRA technique without and with intravenous contrast. CONTRAST:  55m GADAVIST GADOBUTROL 1 MMOL/ML IV SOLN COMPARISON:  Neck CT 12/19/2018. Brain MRI today. Brain MRI 04/21/2016. FINDINGS: Precontrast time-of-flight images demonstrate antegrade flow in both cervical carotid and vertebral arteries to the skull base. The left vertebral appears slightly dominant. Carotid bifurcations appear within normal limits. Post-contrast neck MRA images reveal a 3 vessel arch configuration. Great vessel  origins appear normal. Mildly tortuous but otherwise negative right CCA. At the posterior right ICA origin there is mild irregularity compatible with plaque but no significant cervical right ICA stenosis. There was corresponding right ICA origin atherosclerosis on the December CT comparison. Mildly tortuous left CCA. Negative left carotid bifurcation and cervical left ICA. There was left carotid bifurcation atherosclerosis on the December CT comparison. Tortuous proximal right subclavian artery with a stenotic appearance at the thoracic inlet. And this corresponds to a combination of calcified plaque and tortuosity of the vessel with a kinked appearance on the December CT. Both vertebral artery origins appear normal. Mildly dominant left vertebral artery, but no vertebral stenosis identified to the skull base. IMPRESSION: Right subclavian and bilateral carotid bifurcation atherosclerosis which is also demonstrated on a 12/19/2018 neck CT, but negative for hemodynamically significant stenosis. Electronically Signed   By: HGenevie AnnM.D.   On: 02/26/2019 16:38   MR BRAIN WO CONTRAST  Result Date: 02/26/2019 CLINICAL DATA:  64year old female with headache and dizziness. EXAM: MRI HEAD WITHOUT CONTRAST TECHNIQUE: Multiplanar, multiecho pulse sequences of the brain and surrounding structures were obtained without intravenous  contrast. COMPARISON:  Head CT earlier today. Neck CT 12/19/2018. Brain MRI 04/21/2016. FINDINGS: Brain: No restricted diffusion to suggest acute infarction. No midline shift, mass effect, evidence of mass lesion, ventriculomegaly, extra-axial collection or acute intracranial hemorrhage. Cervicomedullary junction and pituitary are within normal limits. Thick gray and white matter signal appears stable throughout the brain since 2018. There are chronic microhemorrhages in the right periatrial white matter. But otherwise mild for age nonspecific white matter T2 and FLAIR hyperintensity. No cortical  encephalomalacia. Deep gray nuclei, brainstem and cerebellum remain normal for age. Vascular: Major intracranial vascular flow voids are stable since 2018. Skull and upper cervical spine: Normal for age visible cervical spine. Visualized bone marrow signal is within normal limits. Sinuses/Orbits: Stable and negative. Other: Stable mild left mastoid effusion. Negative nasopharynx. Grossly normal visible internal auditory structures. Scalp and face soft tissues appear negative. IMPRESSION: 1. No acute intracranial abnormality. 2. Stable noncontrast MRI appearance of the brain since 2018: chronic microhemorrhages in the right hemisphere indicative of small-vessel disease although all only mild for age white matter signal changes otherwise. Electronically Signed   By: Genevie Ann M.D.   On: 02/26/2019 16:33   DG Abd Portable 1V  Result Date: 03/02/2019 CLINICAL DATA:  Constipation, abdominal pain and distention. EXAM: PORTABLE ABDOMEN - 1 VIEW COMPARISON:  January 30, 2019. FINDINGS: No abnormal bowel dilatation is noted. Large amount of stool seen throughout the colon. No radio-opaque calculi or other significant radiographic abnormality are seen. IMPRESSION: Large stool burden is noted. No abnormal bowel dilatation is noted. Electronically Signed   By: Marijo Conception M.D.   On: 03/02/2019 10:22   ECHOCARDIOGRAM COMPLETE  Result Date: 02/27/2019    ECHOCARDIOGRAM REPORT   Patient Name:   BRITINEY BLAHNIK Date of Exam: 02/27/2019 Medical Rec #:  [id number]    Height:       61.0 in Accession #:    [id number]   Weight:       115.0 lb Date of Birth:  1955-10-09     BSA:          1.493 m Patient Age:    80 years     BP:           153/80 mmHg Patient Gender: F            HR:           69 bpm. Exam Location:  [facility] Procedure: 2D Echo, Color Doppler and Cardiac Doppler Indications:     G45.9 TIA  History:         Patient has prior history of Echocardiogram examinations. Risk                  Factors:Hypertension, Diabetes and  HCL.  Sonographer:     Charmayne Sheer RDCS (AE) Referring Phys:  [place] Diagnosing Phys: Kathlyn Sacramento MD IMPRESSIONS  1. Left ventricular ejection fraction, by estimation, is 65 to 70%. The left ventricle has normal function. The left ventricle has no regional wall motion abnormalities. There is mild left ventricular hypertrophy. Left ventricular diastolic parameters are consistent with Grade I diastolic dysfunction (impaired relaxation).  2. Right ventricular systolic function is normal. The right ventricular size is normal. Tricuspid regurgitation signal is inadequate for assessing PA pressure.  3. The mitral valve is normal in structure and function. No evidence of mitral valve regurgitation. No evidence of mitral stenosis.  4. The aortic valve is normal in structure and function. Aortic valve regurgitation  is not visualized. No aortic stenosis is present.  5. The inferior vena cava is normal in size with greater than 50% respiratory variability, suggesting right atrial pressure of 3 mmHg. FINDINGS  Left Ventricle: Left ventricular ejection fraction, by estimation, is 65 to 70%. The left ventricle has normal function. The left ventricle has no regional wall motion abnormalities. The left ventricular internal cavity size was normal in size. There is  mild left ventricular hypertrophy. Left ventricular diastolic parameters are consistent with Grade I diastolic dysfunction (impaired relaxation). Right Ventricle: The right ventricular size is normal. No increase in right ventricular wall thickness. Right ventricular systolic function is normal. Tricuspid regurgitation signal is inadequate for assessing PA pressure. Left Atrium: Left atrial size was normal in size. Right Atrium: Right atrial size was normal in size. Pericardium: There is no evidence of pericardial effusion. Mitral Valve: The mitral valve is normal in structure and function. Normal mobility of the mitral valve leaflets. No evidence of mitral  valve regurgitation. No evidence of mitral valve stenosis. MV peak gradient, 4.8 mmHg. The mean mitral valve gradient is 2.0 mmHg. Tricuspid Valve: The tricuspid valve is normal in structure. Tricuspid valve regurgitation is not demonstrated. No evidence of tricuspid stenosis. Aortic Valve: The aortic valve is normal in structure and function. Aortic valve regurgitation is not visualized. No aortic stenosis is present. Aortic valve mean gradient measures 7.0 mmHg. Aortic valve peak gradient measures 13.7 mmHg. Aortic valve area, by VTI measures 1.26 cm. Pulmonic Valve: The pulmonic valve was normal in structure. Pulmonic valve regurgitation is not visualized. No evidence of pulmonic stenosis. Aorta: The aortic root is normal in size and structure. Venous: The inferior vena cava is normal in size with greater than 50% respiratory variability, suggesting right atrial pressure of 3 mmHg. IAS/Shunts: No atrial level shunt detected by color flow Doppler.  LEFT VENTRICLE PLAX 2D LVIDd:         3.74 cm  Diastology LVIDs:         2.16 cm  LV e' lateral:   4.68 cm/s LV PW:         0.78 cm  LV E/e' lateral: 16.7 LV IVS:        0.62 cm  LV e' medial:    4.79 cm/s LVOT diam:     1.70 cm  LV E/e' medial:  16.3 LV SV:         44.03 ml LV SV Index:   29.50 LVOT Area:     2.27 cm  RIGHT VENTRICLE RV Basal diam:  2.86 cm LEFT ATRIUM             Index       RIGHT ATRIUM           Index LA diam:        3.00 cm 2.01 cm/m  RA Area:     10.20 cm LA Vol (A2C):   28.6 ml 19.16 ml/m RA Volume:   18.60 ml  12.46 ml/m LA Vol (A4C):   33.0 ml 22.10 ml/m LA Biplane Vol: 32.4 ml 21.70 ml/m  AORTIC VALVE                    PULMONIC VALVE AV Area (Vmax):    1.51 cm     PV Vmax:       0.90 m/s AV Area (Vmean):   1.49 cm     PV Vmean:      61.500 cm/s AV Area (VTI):  1.26 cm     PV VTI:        0.210 m AV Vmax:           185.00 cm/s  PV Peak grad:  3.2 mmHg AV Vmean:          126.000 cm/s PV Mean grad:  2.0 mmHg AV VTI:            0.351  m AV Peak Grad:      13.7 mmHg AV Mean Grad:      7.0 mmHg LVOT Vmax:         123.00 cm/s LVOT Vmean:        82.600 cm/s LVOT VTI:          0.194 m LVOT/AV VTI ratio: 0.55  AORTA Ao Root diam: 2.90 cm MITRAL VALVE MV Area (PHT): 2.99 cm     SHUNTS MV Peak grad:  4.8 mmHg     Systemic VTI:  0.19 m MV Mean grad:  2.0 mmHg     Systemic Diam: 1.70 cm MV Vmax:       1.09 m/s MV Vmean:      61.9 cm/s MV Decel Time: 254 msec MV E velocity: 78.10 cm/s MV A velocity: 101.00 cm/s MV E/A ratio:  0.77 Kathlyn Sacramento MD Electronically signed by Kathlyn Sacramento MD Signature Date/Time: 02/27/2019/1:29:44 PM    Final       Subjective: Pt c/o abd pain   Discharge Exam: Vitals:   03/03/19 0018 03/03/19 0816  BP: (!) 144/70 (!) 148/92  Pulse: 70 74  Resp: 15 16  Temp: 97.8 F (36.6 C) 98.3 F (36.8 C)  SpO2: 100% 98%   Vitals:   03/02/19 0749 03/02/19 1555 03/03/19 0018 03/03/19 0816  BP: (!) 156/84 (!) 146/78 (!) 144/70 (!) 148/92  Pulse: 73 73 70 74  Resp: 18 18 15 16   Temp: 98.5 F (36.9 C) 97.8 F (36.6 C) 97.8 F (36.6 C) 98.3 F (36.8 C)  TempSrc: Oral Oral Oral   SpO2: 98% 98% 100% 98%  Weight:      Height:        General: Pt is alert, awake, not in acute distress Cardiovascular:  S1/S2 +, no rubs, no gallops Respiratory: CTA bilaterally, no wheezing, no rales Abdominal: Soft, NT, ND, bowel sounds + Extremities: no edema, no cyanosis    The results of significant diagnostics from this hospitalization (including imaging, microbiology, ancillary and laboratory) are listed below for reference.     Microbiology: Recent Results (from the past 240 hour(s))  SARS CORONAVIRUS 2 (TAT 6-24 HRS) Nasopharyngeal Nasopharyngeal Swab     Status: None   Collection Time: 02/26/19 12:11 PM   Specimen: Nasopharyngeal Swab  Result Value Ref Range Status   SARS Coronavirus 2 NEGATIVE NEGATIVE Final    Comment: (NOTE) SARS-CoV-2 target nucleic acids are NOT DETECTED. The SARS-CoV-2 RNA is  generally detectable in upper and lower respiratory specimens during the acute phase of infection. Negative results do not preclude SARS-CoV-2 infection, do not rule out co-infections with other pathogens, and should not be used as the sole basis for treatment or other patient management decisions. Negative results must be combined with clinical observations, patient history, and epidemiological information. The expected result is Negative. Fact Sheet for Patients: SugarRoll.be Fact Sheet for Healthcare Providers: https://www.woods-mathews.com/ This test is not yet approved or cleared by the [place] FDA and  has been authorized for detection and/or diagnosis of SARS-CoV-2 by FDA under an Emergency Use  Authorization (EUA). This EUA will remain  in effect (meaning this test can be used) for the duration of the COVID-19 declaration under Section 56 4(b)(1) of the Act, 21 U.S.C. section 360bbb-3(b)(1), unless the authorization is terminated or revoked sooner. Performed at [facility], [place] [address] [place], [place] [address]      Labs: BNP (last 3 results) Recent Labs    03/12/18 0844  BNP 40.8   Basic Metabolic Panel: Recent Labs  Lab 02/26/19 1151 02/26/19 1151 02/27/19 0311 02/28/19 0758 03/01/19 0313 03/02/19 0316 03/03/19 0417  NA 139  --  138  --  139 138 138  K 4.4  --  4.3  --  4.4 4.5 4.0  CL 104  --  105  --  108 108 105  CO2 27  --  26  --  25 21* 25  GLUCOSE 258*   < > 342* 408* 333* 284* 291*  BUN 34*  --  28*  --  30* 29* 32*  CREATININE 1.23*  --  1.11*  --  1.55* 1.40* 1.30*  CALCIUM 8.9  --  8.7*  --  8.3* 8.1* 8.8*  MG  --   --   --   --   --  2.0  --   PHOS  --   --   --   --   --  4.2 4.5   < > = values in this interval not displayed.   Liver Function Tests: Recent Labs  Lab 02/26/19 1814 02/27/19 0311 03/02/19 0316 03/03/19 0417  AST 22 18  --   --   ALT 39 34  --   --   ALKPHOS 293*  271*  --   --   BILITOT 0.8 0.5  --   --   PROT 7.0 7.2  --   --   ALBUMIN 3.5 3.4* 2.8* 3.2*   Recent Labs  Lab 02/26/19 1814  LIPASE 55*   No results for input(s): AMMONIA in the last 168 hours. CBC: Recent Labs  Lab 02/26/19 1151 02/27/19 0311 03/01/19 0313 03/02/19 0316 03/03/19 0417  WBC 8.6 7.2 9.3 9.1 8.1  NEUTROABS  --  3.2  --   --   --   HGB 12.2 10.9* 10.2* 9.8* 10.4*  HCT 36.1 32.5* 30.6* 29.6* 31.8*  MCV 83.6 84.2 85.0 85.3 86.2  PLT 194 192 179 157 167   Cardiac Enzymes: No results for input(s): CKTOTAL, CKMB, CKMBINDEX, TROPONINI in the last 168 hours. BNP: Invalid input(s): POCBNP CBG: Recent Labs  Lab 03/02/19 1142 03/02/19 1630 03/02/19 2136 03/03/19 0826 03/03/19 1213  GLUCAP 240* 196* 236* 219* 179*   D-Dimer No results for input(s): DDIMER in the last 72 hours. Hgb A1c No results for input(s): HGBA1C in the last 72 hours. Lipid Profile No results for input(s): CHOL, HDL, LDLCALC, TRIG, CHOLHDL, LDLDIRECT in the last 72 hours. Thyroid function studies No results for input(s): TSH, T4TOTAL, T3FREE, THYROIDAB in the last 72 hours.  Invalid input(s): FREET3 Anemia work up No results for input(s): VITAMINB12, FOLATE, FERRITIN, TIBC, IRON, RETICCTPCT in the last 72 hours. Urinalysis    Component Value Date/Time   COLORURINE STRAW (A) 02/26/2019 1423   APPEARANCEUR CLEAR (A) 02/26/2019 1423   APPEARANCEUR Hazy 08/11/2013 0729   LABSPEC 1.010 02/26/2019 1423   LABSPEC 1.030 08/11/2013 0729   PHURINE 6.0 02/26/2019 1423   GLUCOSEU >=500 (A) 02/26/2019 1423   GLUCOSEU >=500 08/11/2013 0729   HGBUR NEGATIVE 02/26/2019 1423  BILIRUBINUR NEGATIVE 02/26/2019 1423   BILIRUBINUR Negative 08/11/2013 0729   KETONESUR NEGATIVE 02/26/2019 1423   PROTEINUR 100 (A) 02/26/2019 1423   NITRITE NEGATIVE 02/26/2019 1423   LEUKOCYTESUR NEGATIVE 02/26/2019 1423   LEUKOCYTESUR Negative 08/11/2013 0729   Sepsis Labs Invalid input(s): PROCALCITONIN,   WBC,  LACTICIDVEN Microbiology Recent Results (from the past 240 hour(s))  SARS CORONAVIRUS 2 (TAT 6-24 HRS) Nasopharyngeal Nasopharyngeal Swab     Status: None   Collection Time: 02/26/19 12:11 PM   Specimen: Nasopharyngeal Swab  Result Value Ref Range Status   SARS Coronavirus 2 NEGATIVE NEGATIVE Final    Comment: (NOTE) SARS-CoV-2 target nucleic acids are NOT DETECTED. The SARS-CoV-2 RNA is generally detectable in upper and lower respiratory specimens during the acute phase of infection. Negative results do not preclude SARS-CoV-2 infection, do not rule out co-infections with other pathogens, and should not be used as the sole basis for treatment or other patient management decisions. Negative results must be combined with clinical observations, patient history, and epidemiological information. The expected result is Negative. Fact Sheet for Patients: SugarRoll.be Fact Sheet for Healthcare Providers: https://www.woods-mathews.com/ This test is not yet approved or cleared by the [place] FDA and  has been authorized for detection and/or diagnosis of SARS-CoV-2 by FDA under an Emergency Use Authorization (EUA). This EUA will remain  in effect (meaning this test can be used) for the duration of the COVID-19 declaration under Section 56 4(b)(1) of the Act, 21 U.S.C. section 360bbb-3(b)(1), unless the authorization is terminated or revoked sooner. Performed at [facility], [place] [address] [place], [place] [address]      Time coordinating discharge: Over 30 minutes  SIGNED:   Wyvonnia Dusky, MD  [facility] 03/03/2019, 12:30 PM Pager   If 7PM-7AM, please contact night-coverage [email or web address]

## 2019-03-03 NOTE — TOC Transition Note (Signed)
Transition of Care [facility]) - CM/SW Discharge Note   Patient Details  Name: Karen [name] MRN: [id number] Date of Birth: [birth year]  [facility] of [facility] CM/SW Contact:  Elease Hashimoto, LCSW Phone Number: 03/03/2019, 9:33 AM   Clinical Narrative:   Met with pt regarding discharge today. She feels better and is ready to go home. She reports she has the care in place between her boyfriend and aide that will soon start. Before this begins boyfriend will take off from his job to be there with her. She has her needed equipment and is aware of [place] follow at home. Her PCS aide should begin in the next two weeks and be able to provide 2-3 hours of assistance. Pt has a PCP and transport to her appointments. Ready for discharge today, once bedside provides the dc paperwork. Discussed with pt substance abuse resources she has declined them and states: " I quit drinking four years ago." Informed positive when admitted this time.     Final next level of care: Home w Home Health Services Barriers to Discharge: Barriers Resolved   Patient Goals and CMS Choice Patient states their goals for this hospitalization and ongoing recovery are:: I want to feel better and go home      Discharge Placement                Patient to be transferred to facility by: Gregory-boyfriend Name of family member notified: yes Patient and family notified of of transfer: 03/03/19  Discharge Plan and Services In-house Referral: Clinical Social Work   Post Acute Care Choice: Home Health                    HH Arranged: PT, RN [facility] Agency: [place] ([place]) Date HH Agency Contacted: 03/01/19 Time HH Agency Contacted: 1200 Representative spoke with at [place]: [place] ([place]) Interventions     Readmission Risk Interventions No flowsheet data found.

## 2019-03-03 NOTE — Progress Notes (Signed)
[facility] to see 2/28-3/1.

## 2019-03-03 NOTE — Progress Notes (Signed)
Inpatient Diabetes Program Recommendations  AACE/ADA: New Consensus Statement on Inpatient Glycemic Control (2015)  Target Ranges:  Prepandial:   less than 140 mg/dL      Peak postprandial:   less than 180 mg/dL (1-2 hours)      Critically ill patients:  140 - 180 mg/dL   Lab Results  Component Value Date   GLUCAP 219 (H) 03/03/2019   HGBA1C 11.1 (H) 02/27/2019    Review of Glycemic Control  Diabetes history: DM 2 Outpatient Diabetes medications: Novolog 70/30 15 units bid Current orders for Inpatient glycemic control:  Lantus 20 units Novolog 4 units tid meal coverage Novolog 0-9 units tid + hs  BUN/Creat: 32/1.30  Inpatient Diabetes Program Recommendations:    Fasting glucose 219 this am.  Consider increasing Lantus to 24 units.  Will watch no Novolog 4 units tid since Lantus maybe increased.  Lantus Solostar insulin pen order # [id number] Insulin pen needles order # [id number] Novolog Flex pen order # [id number]  Thanks,  Tama Headings RN, MSN, BC-ADM Inpatient Diabetes Coordinator Team Pager [phone] (8a-5p)

## 2019-03-09 ENCOUNTER — Other Ambulatory Visit: Payer: Self-pay | Admitting: Family

## 2019-03-09 DIAGNOSIS — Z1231 Encounter for screening mammogram for malignant neoplasm of breast: Secondary | ICD-10-CM

## 2019-03-15 ENCOUNTER — Observation Stay: Payer: Medicaid Other

## 2019-03-15 ENCOUNTER — Other Ambulatory Visit: Payer: Self-pay

## 2019-03-15 ENCOUNTER — Encounter: Payer: Self-pay | Admitting: Emergency Medicine

## 2019-03-15 ENCOUNTER — Observation Stay
Admission: EM | Admit: 2019-03-15 | Discharge: 2019-03-16 | Disposition: A | Discharge status: Home / Self Care | Payer: Medicaid Other | Attending: Internal Medicine | Admitting: Internal Medicine

## 2019-03-15 DIAGNOSIS — R112 Nausea with vomiting, unspecified: Secondary | ICD-10-CM | POA: Diagnosis not present

## 2019-03-15 DIAGNOSIS — Z87891 Personal history of nicotine dependence: Secondary | ICD-10-CM | POA: Insufficient documentation

## 2019-03-15 DIAGNOSIS — K861 Other chronic pancreatitis: Secondary | ICD-10-CM | POA: Diagnosis not present

## 2019-03-15 DIAGNOSIS — Z20822 Contact with and (suspected) exposure to covid-19: Secondary | ICD-10-CM | POA: Diagnosis not present

## 2019-03-15 DIAGNOSIS — E7800 Pure hypercholesterolemia, unspecified: Secondary | ICD-10-CM | POA: Insufficient documentation

## 2019-03-15 DIAGNOSIS — Z794 Long term (current) use of insulin: Secondary | ICD-10-CM | POA: Insufficient documentation

## 2019-03-15 DIAGNOSIS — N1832 Chronic kidney disease, stage 3b: Secondary | ICD-10-CM

## 2019-03-15 DIAGNOSIS — R109 Unspecified abdominal pain: Secondary | ICD-10-CM | POA: Diagnosis present

## 2019-03-15 DIAGNOSIS — I129 Hypertensive chronic kidney disease with stage 1 through stage 4 chronic kidney disease, or unspecified chronic kidney disease: Secondary | ICD-10-CM | POA: Insufficient documentation

## 2019-03-15 DIAGNOSIS — F1010 Alcohol abuse, uncomplicated: Secondary | ICD-10-CM | POA: Diagnosis not present

## 2019-03-15 DIAGNOSIS — I10 Essential (primary) hypertension: Secondary | ICD-10-CM | POA: Diagnosis present

## 2019-03-15 DIAGNOSIS — N1831 Chronic kidney disease, stage 3a: Secondary | ICD-10-CM | POA: Diagnosis not present

## 2019-03-15 DIAGNOSIS — N182 Chronic kidney disease, stage 2 (mild): Secondary | ICD-10-CM | POA: Diagnosis not present

## 2019-03-15 DIAGNOSIS — F1011 Alcohol abuse, in remission: Secondary | ICD-10-CM | POA: Diagnosis not present

## 2019-03-15 DIAGNOSIS — E785 Hyperlipidemia, unspecified: Secondary | ICD-10-CM | POA: Diagnosis not present

## 2019-03-15 DIAGNOSIS — N183 Chronic kidney disease, stage 3 unspecified: Secondary | ICD-10-CM

## 2019-03-15 DIAGNOSIS — R197 Diarrhea, unspecified: Secondary | ICD-10-CM | POA: Insufficient documentation

## 2019-03-15 DIAGNOSIS — Z23 Encounter for immunization: Secondary | ICD-10-CM | POA: Diagnosis not present

## 2019-03-15 DIAGNOSIS — Z79899 Other long term (current) drug therapy: Secondary | ICD-10-CM | POA: Diagnosis not present

## 2019-03-15 DIAGNOSIS — E1122 Type 2 diabetes mellitus with diabetic chronic kidney disease: Secondary | ICD-10-CM | POA: Diagnosis not present

## 2019-03-15 DIAGNOSIS — R519 Headache, unspecified: Secondary | ICD-10-CM | POA: Diagnosis present

## 2019-03-15 DIAGNOSIS — K219 Gastro-esophageal reflux disease without esophagitis: Secondary | ICD-10-CM | POA: Diagnosis present

## 2019-03-15 DIAGNOSIS — R21 Rash and other nonspecific skin eruption: Secondary | ICD-10-CM | POA: Insufficient documentation

## 2019-03-15 DIAGNOSIS — E1129 Type 2 diabetes mellitus with other diabetic kidney complication: Secondary | ICD-10-CM | POA: Diagnosis present

## 2019-03-15 DIAGNOSIS — R1013 Epigastric pain: Secondary | ICD-10-CM

## 2019-03-15 DIAGNOSIS — K859 Acute pancreatitis without necrosis or infection, unspecified: Principal | ICD-10-CM | POA: Diagnosis present

## 2019-03-15 LAB — CBC
HCT: 38 % (ref 36.0–46.0)
Hemoglobin: 12.4 g/dL (ref 12.0–15.0)
MCH: 28 pg (ref 26.0–34.0)
MCHC: 32.6 g/dL (ref 30.0–36.0)
MCV: 85.8 fL (ref 80.0–100.0)
Platelets: 326 10*3/uL (ref 150–400)
RBC: 4.43 MIL/uL (ref 3.87–5.11)
RDW: 12.1 % (ref 11.5–15.5)
WBC: 6.4 10*3/uL (ref 4.0–10.5)
nRBC: 0 % (ref 0.0–0.2)

## 2019-03-15 LAB — URINALYSIS, COMPLETE (UACMP) WITH MICROSCOPIC
Bacteria, UA: NONE SEEN
Bilirubin Urine: NEGATIVE
Glucose, UA: >=500 mg/dL — AB
Hgb urine dipstick: NEGATIVE
Ketones, ur: NEGATIVE mg/dL
Leukocytes,Ua: NEGATIVE
Nitrite: NEGATIVE
Protein, ur: 100 mg/dL — AB
Specific Gravity, Urine: 1.01 (ref 1.005–1.030)
pH: 6 (ref 5.0–8.0)

## 2019-03-15 LAB — COMPREHENSIVE METABOLIC PANEL
ALT: 42 U/L (ref 0–44)
AST: 36 U/L (ref 15–41)
Albumin: 4.2 g/dL (ref 3.5–5.0)
Alkaline Phosphatase: 284 U/L — ABNORMAL HIGH (ref 38–126)
Anion gap: 11 (ref 5–15)
BUN: 20 mg/dL (ref 8–23)
CO2: 25 mmol/L (ref 22–32)
Calcium: 9.1 mg/dL (ref 8.9–10.3)
Chloride: 98 mmol/L (ref 98–111)
Creatinine, Ser: 1.43 mg/dL — ABNORMAL HIGH (ref 0.44–1.00)
GFR calc Af Amer: 45 mL/min — ABNORMAL LOW (ref >60)
GFR calc non Af Amer: 39 mL/min — ABNORMAL LOW (ref >60)
Glucose, Bld: 354 mg/dL — ABNORMAL HIGH (ref 70–99)
Potassium: 4.3 mmol/L (ref 3.5–5.1)
Sodium: 134 mmol/L — ABNORMAL LOW (ref 135–145)
Total Bilirubin: 0.7 mg/dL (ref 0.3–1.2)
Total Protein: 8.3 g/dL — ABNORMAL HIGH (ref 6.5–8.1)

## 2019-03-15 LAB — APTT: aPTT: 34 seconds (ref 24–36)

## 2019-03-15 LAB — SARS CORONAVIRUS 2 (TAT 6-24 HRS): SARS Coronavirus 2: NEGATIVE

## 2019-03-15 LAB — GLUCOSE, CAPILLARY
Glucose-Capillary: 348 mg/dL — ABNORMAL HIGH (ref 70–99)
Glucose-Capillary: 105 mg/dL — ABNORMAL HIGH (ref 70–99)
Glucose-Capillary: 100 mg/dL — ABNORMAL HIGH (ref 70–99)
Glucose-Capillary: 173 mg/dL — ABNORMAL HIGH (ref 70–99)
Glucose-Capillary: 125 mg/dL — ABNORMAL HIGH (ref 70–99)

## 2019-03-15 LAB — LIPASE, BLOOD: Lipase: 58 U/L — ABNORMAL HIGH (ref 11–51)

## 2019-03-15 LAB — PROTIME-INR
INR: 0.9 (ref 0.8–1.2)
Prothrombin Time: 11.9 seconds (ref 11.4–15.2)

## 2019-03-15 LAB — ETHANOL: Alcohol, Ethyl (B): <10 mg/dL (ref <10)

## 2019-03-15 MED ORDER — PREGABALIN 75 MG PO CAPS: 150.0000 mg | ORAL_CAPSULE | Freq: Two times a day (BID) | ORAL | Status: DC

## 2019-03-15 MED ORDER — SODIUM CHLORIDE 0.9% FLUSH: 3.0000 mL | Freq: Once | INTRAVENOUS | Status: DC

## 2019-03-15 MED ORDER — AMLODIPINE BESYLATE 10 MG PO TABS
10.0000 mg | ORAL_TABLET | Freq: Every day | ORAL | Status: DC
  Administered 2019-03-15 – 2019-03-16 (×2): 10 mg via ORAL
  Filled 2019-03-15 (×2): qty 1

## 2019-03-15 MED ORDER — PREGABALIN 75 MG PO CAPS
75.0000 mg | ORAL_CAPSULE | Freq: Two times a day (BID) | ORAL | Status: DC
  Administered 2019-03-15 – 2019-03-16 (×2): 75 mg via ORAL
  Filled 2019-03-15 (×2): qty 1

## 2019-03-15 MED ORDER — FAMOTIDINE IN NACL 20-0.9 MG/50ML-% IV SOLN
20.0000 mg | Freq: Two times a day (BID) | INTRAVENOUS | Status: DC
  Administered 2019-03-15 (×2): 20 mg via INTRAVENOUS
  Filled 2019-03-15 (×2): qty 50

## 2019-03-15 MED ORDER — FENTANYL CITRATE (PF) 100 MCG/2ML IJ SOLN: 25.0000 ug | INTRAMUSCULAR | Status: DC | PRN

## 2019-03-15 MED ORDER — HYDROMORPHONE HCL 1 MG/ML IJ SOLN
0.5000 mg | INTRAMUSCULAR | Status: DC | PRN
  Administered 2019-03-15 (×2): 0.5 mg via INTRAVENOUS
  Filled 2019-03-15 (×2): qty 1

## 2019-03-15 MED ORDER — FENTANYL CITRATE (PF) 100 MCG/2ML IJ SOLN
50.0000 ug | Freq: Once | INTRAMUSCULAR | Status: AC
  Administered 2019-03-15: 50 ug via INTRAVENOUS
  Filled 2019-03-15: qty 2

## 2019-03-15 MED ORDER — DIPHENHYDRAMINE HCL 25 MG PO CAPS
25.0000 mg | ORAL_CAPSULE | Freq: Three times a day (TID) | ORAL | Status: DC | PRN
  Administered 2019-03-15: 25 mg via ORAL
  Filled 2019-03-15: qty 1

## 2019-03-15 MED ORDER — INSULIN GLARGINE 100 UNIT/ML ~~LOC~~ SOLN
3.0000 [IU] | Freq: Every day | SUBCUTANEOUS | Status: DC
  Filled 2019-03-15: qty 0.03

## 2019-03-15 MED ORDER — HYDRALAZINE HCL 50 MG PO TABS: 25.0000 mg | ORAL_TABLET | Freq: Three times a day (TID) | ORAL | Status: DC | PRN

## 2019-03-15 MED ORDER — INSULIN GLARGINE 100 UNIT/ML ~~LOC~~ SOLN
5.0000 [IU] | Freq: Every day | SUBCUTANEOUS | Status: DC
  Administered 2019-03-16: 10:00:00 5 [IU] via SUBCUTANEOUS
  Filled 2019-03-15 (×2): qty 0.05

## 2019-03-15 MED ORDER — INSULIN GLARGINE 100 UNIT/ML ~~LOC~~ SOLN: 5.0000 [IU] | Freq: Every day | SUBCUTANEOUS | Status: DC

## 2019-03-15 MED ORDER — ONDANSETRON HCL 4 MG/2ML IJ SOLN
4.0000 mg | Freq: Three times a day (TID) | INTRAMUSCULAR | Status: DC | PRN
  Administered 2019-03-16: 4 mg via INTRAVENOUS
  Filled 2019-03-15: qty 2

## 2019-03-15 MED ORDER — SODIUM CHLORIDE 0.9 % IV BOLUS
1000.0000 mL | Freq: Once | INTRAVENOUS | Status: AC
  Administered 2019-03-15: 1000 mL via INTRAVENOUS

## 2019-03-15 MED ORDER — PANCRELIPASE (LIP-PROT-AMYL) 12000-38000 UNITS PO CPEP
72000.0000 [IU] | ORAL_CAPSULE | Freq: Three times a day (TID) | ORAL | Status: DC
  Administered 2019-03-15 – 2019-03-16 (×2): 72000 [IU] via ORAL
  Filled 2019-03-15 (×4): qty 6

## 2019-03-15 MED ORDER — DIPHENHYDRAMINE HCL 50 MG/ML IJ SOLN
12.5000 mg | Freq: Three times a day (TID) | INTRAMUSCULAR | Status: DC | PRN
  Administered 2019-03-15 – 2019-03-16 (×2): 12.5 mg via INTRAVENOUS
  Filled 2019-03-15 (×2): qty 1

## 2019-03-15 MED ORDER — FENTANYL CITRATE (PF) 100 MCG/2ML IJ SOLN
100.0000 ug | Freq: Once | INTRAMUSCULAR | Status: AC
  Administered 2019-03-15: 100 ug via INTRAVENOUS
  Filled 2019-03-15: qty 2

## 2019-03-15 MED ORDER — INSULIN ASPART 100 UNIT/ML ~~LOC~~ SOLN
8.0000 [IU] | Freq: Once | SUBCUTANEOUS | Status: AC
  Administered 2019-03-15: 8 [IU] via SUBCUTANEOUS
  Filled 2019-03-15: qty 1

## 2019-03-15 MED ORDER — HYDROCODONE-ACETAMINOPHEN 5-325 MG PO TABS
1.0000 | ORAL_TABLET | Freq: Four times a day (QID) | ORAL | Status: DC | PRN
  Administered 2019-03-15 – 2019-03-16 (×3): 1 via ORAL
  Filled 2019-03-15 (×3): qty 1

## 2019-03-15 MED ORDER — ONDANSETRON HCL 4 MG/2ML IJ SOLN
4.0000 mg | Freq: Once | INTRAMUSCULAR | Status: AC
  Administered 2019-03-15: 4 mg via INTRAVENOUS
  Filled 2019-03-15: qty 2

## 2019-03-15 MED ORDER — ROSUVASTATIN CALCIUM 20 MG PO TABS
20.0000 mg | ORAL_TABLET | Freq: Every day | ORAL | Status: DC
  Administered 2019-03-15: 17:00:00 20 mg via ORAL
  Filled 2019-03-15: qty 1

## 2019-03-15 MED ORDER — ZOLPIDEM TARTRATE 5 MG PO TABS
5.0000 mg | ORAL_TABLET | Freq: Every evening | ORAL | Status: DC | PRN
  Administered 2019-03-15: 5 mg via ORAL
  Filled 2019-03-15: qty 1

## 2019-03-15 MED ORDER — HEPARIN SODIUM (PORCINE) 5000 UNIT/ML IJ SOLN
5000.0000 [IU] | Freq: Three times a day (TID) | INTRAMUSCULAR | Status: DC
  Administered 2019-03-15 – 2019-03-16 (×2): 5000 [IU] via SUBCUTANEOUS
  Filled 2019-03-15 (×2): qty 1

## 2019-03-15 MED ORDER — SODIUM CHLORIDE 0.9 % IV SOLN: INTRAVENOUS | Status: DC

## 2019-03-15 MED ORDER — INSULIN ASPART 100 UNIT/ML ~~LOC~~ SOLN
0.0000 [IU] | SUBCUTANEOUS | Status: DC
  Administered 2019-03-15: 0 [IU] via SUBCUTANEOUS
  Administered 2019-03-15: 1 [IU] via SUBCUTANEOUS
  Administered 2019-03-15: 0 [IU] via SUBCUTANEOUS
  Administered 2019-03-15 – 2019-03-16 (×2): 2 [IU] via SUBCUTANEOUS
  Administered 2019-03-16: 3 [IU] via SUBCUTANEOUS
  Filled 2019-03-15 (×5): qty 1

## 2019-03-15 NOTE — ED Provider Notes (Addendum)
[facility] Emergency Department Provider Note  ____________________________________________   First MD Initiated Contact with Patient 03/15/19 0600     (approximate)  I have reviewed the triage vital signs and the nursing notes.  History  Chief Complaint Headache, Abdominal Pain, and Emesis    HPI Karen [name] is a 64 y.o. female with history of diabetes, recurrent pancreatitis who presents to the ED for abdominal pain. Patient reports two days of headache, abdominal pain, nausea, vomiting, loose stool. All are symptoms consistent with prior episodes of pancreatitis.  Pain is in the upper mid abdomen, epigastric area.  Describes it as sharp.  8/10 in severity.  No radiation.  No alleviating or aggravating components. Headache is dull, worsened with movement. Reports several episodes of vomiting since yesterday. No dysuria or malodorous urine. No fevers or chills.    Past Medical Hx Past Medical History:  Diagnosis Date  . Alcoholic pancreatitis   . Asthma   . Depression   . Diabetes mellitus without complication ([place])   . DKA (diabetic ketoacidoses) ([place]) 02/17/2015  . Emphysematous cystitis   . Hypercholesteremia   . Hypertension   . Hypokalemia     Problem List Patient Active Problem List   Diagnosis Date Noted  . Headache 02/26/2019  . Dizziness 02/26/2019  . Hyperglycemia due to type 2 diabetes mellitus ([place]) 01/30/2019  . Acute pancreatitis 01/30/2019  . Constipation   . Acute on chronic pancreatitis ([place]) 01/25/2019  . Acute pain of left shoulder   . Adhesive capsulitis of left shoulder   . Abnormal LFTs 01/24/2019  . Cough 01/24/2019  . Hypertension   . Hypercholesteremia   . Type 2 diabetes mellitus with hyperlipidemia ([place])   . GERD (gastroesophageal reflux disease)   . Acute renal failure superimposed on stage 3a chronic kidney disease ([place])   . Pancreatitis, recurrent   . Chronic pancreatitis ([place]) 09/25/2018  . Prominent  ampulla of Vater 11/29/2017  . Coffee ground emesis   . Upper GI bleed 11/11/2017  . Pancreatitis, acute 01/03/2017  . ETOH abuse 01/03/2017  . UTI (urinary tract infection) 01/03/2017  . Nausea and vomiting   . Esophageal candidiasis ([place])   . Intractable vomiting with nausea   . Hypertensive urgency 04/21/2016  . Abdominal pain   . Nausea vomiting and diarrhea   . Smoker   . Poorly controlled type 2 diabetes mellitus ([place])   . Acute renal insufficiency 10/29/2015  . Elevated troponin 10/29/2015  . Urinary tract infection without hematuria 10/29/2015  . Left flank pain 10/29/2015  . Malignant essential hypertension 10/29/2015  . DKA (diabetic ketoacidoses) ([place]) 02/17/2015  . Emphysematous cystitis     Past Surgical Hx Past Surgical History:  Procedure Laterality Date  . ABDOMINAL HYSTERECTOMY  1996  . APPENDECTOMY  1997  . ESOPHAGOGASTRODUODENOSCOPY N/A 11/12/2017   Procedure: ESOPHAGOGASTRODUODENOSCOPY (EGD);  Surgeon: Lin Landsman, MD;  Location: [facility] ENDOSCOPY;  Service: Gastroenterology;  Laterality: N/A;  . ESOPHAGOGASTRODUODENOSCOPY N/A 09/29/2018   Procedure: ESOPHAGOGASTRODUODENOSCOPY (EGD);  Surgeon: Toledo, Benay Pike, MD;  Location: [facility] ENDOSCOPY;  Service: Gastroenterology;  Laterality: N/A;  . ESOPHAGOGASTRODUODENOSCOPY (EGD) WITH PROPOFOL N/A 04/24/2016   Procedure: ESOPHAGOGASTRODUODENOSCOPY (EGD) WITH PROPOFOL;  Surgeon: [name], MD;  Location: [facility] ENDOSCOPY;  Service: Endoscopy;  Laterality: N/A;  . ESOPHAGOGASTRODUODENOSCOPY (EGD) WITH PROPOFOL N/A 01/28/2019   Procedure: ESOPHAGOGASTRODUODENOSCOPY (EGD) WITH PROPOFOL;  Surgeon: Toledo, Benay Pike, MD;  Location: [facility] ENDOSCOPY;  Service: Gastroenterology;  Laterality: N/A;  . HAND SURGERY  1988  .  THYROID SURGERY  2013    Medications Prior to Admission medications   Medication Sig Start Date End Date Taking? Authorizing Provider  amLODipine (NORVASC) 10 MG tablet Take 10 mg by mouth daily.     [provider]  insulin aspart (NOVOLOG) 100 UNIT/ML injection Inject 4 Units into the skin 3 (three) times daily with meals. Patient taking differently: Inject 15 Units into the skin 2 (two) times daily.  01/29/19   Loletha Grayer, MD  lipase/protease/amylase (CREON) 36000 UNITS CPEP capsule Take 2 capsules (72,000 Units total) by mouth 3 (three) times daily with meals. 01/29/19   Loletha Grayer, MD  nystatin (MYCOSTATIN/NYSTOP) powder Apply topically 2 (two) times daily. 01/29/19   Loletha Grayer, MD  omeprazole (PRILOSEC) 40 MG capsule Take 1 capsule (40 mg total) by mouth 2 (two) times daily for 14 days. 09/22/18 02/26/19  Merlyn Lot, MD  pregabalin (LYRICA) 150 MG capsule Take 150 mg by mouth 2 (two) times daily.    [provider]  rosuvastatin (CRESTOR) 20 MG tablet Take 20 mg by mouth daily.    [provider]  zolpidem (AMBIEN) 5 MG tablet Take 1 tablet (5 mg total) by mouth at bedtime as needed for up to 5 days for sleep. 03/03/19 03/08/19  Wyvonnia Dusky, MD  GLUCERNA [facility]) LIQD Take 237 mLs by mouth 3 (three) times daily between meals. 02/19/15 02/27/15  Bettey Costa, MD    Allergies Penicillins, Reglan [metoclopramide], and Percocet [oxycodone-acetaminophen]  Family Hx Family History  Family history unknown: Yes    Social Hx Social History   Tobacco Use  . Smoking status: Former Research scientist (life sciences)  . Smokeless tobacco: Never Used  . Tobacco comment: quit 7 years ago   Substance Use Topics  . Alcohol use: Not Currently    Comment: quit drinking 2 years ago   . Drug use: No     Review of Systems  Constitutional: Negative for fever, chills. Eyes: Negative for visual changes. ENT: Negative for sore throat. Cardiovascular: Negative for chest pain. Respiratory: Negative for shortness of breath. Gastrointestinal: + for nausea, vomiting, abdominal pain, loose stool.  Genitourinary: Negative for dysuria. Musculoskeletal: Negative for leg  swelling. Skin: Negative for rash. Neurological: + for headaches.   Physical Exam  Vital Signs: ED Triage Vitals  Enc Vitals Group     BP 03/15/19 0534 (!) 160/90     Pulse Rate 03/15/19 0534 80     Resp 03/15/19 0534 18     Temp 03/15/19 0534 98.4 F (36.9 C)     Temp Source 03/15/19 0534 Oral     SpO2 03/15/19 0534 98 %     Weight 03/15/19 0535 110 lb (49.9 kg)     Height 03/15/19 0535 5\' 1"  (1.549 m)     Head Circumference --      Peak Flow --      Pain Score 03/15/19 0534 8     Pain Loc --      Pain Edu? --      Excl. in [place]? --     Constitutional: Alert and oriented.  Head: Normocephalic. Atraumatic. Eyes: Conjunctivae clear, sclera anicteric. Pupils equal and symmetric. Nose: No masses or lesions. No congestion or rhinorrhea. Mouth/Throat: Wearing mask.  Neck: No stridor. Trachea midline.  Cardiovascular: Normal rate, regular rhythm. Extremities well perfused. Respiratory: Normal respiratory effort.  Lungs CTAB. Gastrointestinal: Soft. Non-distended. Mild epigastric tenderness to palpation. No rebound or guarding.  Genitourinary: Deferred. Musculoskeletal: No lower extremity edema. No deformities. Neurologic:  Normal speech and language. No gross focal or lateralizing neurologic deficits are appreciated.  Skin: Skin is warm, dry and intact. No rash noted. Psychiatric: Mood and affect are appropriate for situation.  EKG  Personally reviewed and interpreted by myself.   Rate: 76 Rhythm: sinus Axis: normal Intervals: WNL No acute ischemic changes No STEMI    Radiology  N/A   Procedures  Procedure(s) performed (including critical care):  Procedures   Initial Impression / Assessment and Plan / MDM / ED Course  64 y.o. female who presents to the ED for abdominal pain, nausea, vomiting, diarrhea, headache, similar to prior episodes of pancreatitis  Ddx: pancreatitis, GI viral process, COVID, hyperglycemia, DKA, UTI. Abdomen with mild tenderness in  epigastrium at this time, no rebound or guarding or peritoneal signs, therefore do not feel emergent CT imaging indicated at present.  Will obtain labs, place IV for medications, fluids, reassess.  Labs reveal creatinine 1.43, within prior range, just slightly increased from two weeks ago at 1.30.  Hyperglycemic 354, no gap.  Lipase 58.  UA without evidence of infection.  Will order some subQ insulin and reassess after medications and fluids.  If improved and able to tolerate PO would anticipate discharge, though does appear she has required admission in the past due to persistent symptomatic pancreatitis.   _______________________________  As part of my medical decision making I have reviewed available labs, radiology tests, reviewed old records.  Final Clinical Impression(s) / ED Diagnosis  Final diagnoses:  Epigastric abdominal pain       Note:  This document was prepared using Dragon voice recognition software and may include unintentional dictation errors.     Lilia Pro., MD 03/15/19 [phone]

## 2019-03-15 NOTE — ED Notes (Signed)
Assumed care of patient aox4, reports hx of pancreatitis, here for flair up. IVF infusing, reports medication helped the abd pain, now 7/10. Vss. Safety maintained will monitor.

## 2019-03-15 NOTE — ED Notes (Signed)
Patient reports continues to have pain, Md aware. meds ordered and administered. Will monitor. IVF infusing, VSS.

## 2019-03-15 NOTE — H&P (Addendum)
History and Physical    Karen [name] [id number] DOB: October 08, 1955 DOA: 03/15/2019  Referring MD/NP/PA:   PCP: Lavera Guise, MD   Patient coming from:  The patient is coming from home.  At baseline, pt is independent for most of ADL.        Chief Complaint: Nausea, vomiting, diarrhea, abdominal pain  HPI: Karen [name] is a 64 y.o. female with medical history significant of alcohol abuse in remission, alcoholic pancreatitis, hyperlipidemia, hypertension, diabetes mellitus, depression, asthma, DKA, emphysematous cystitis, GI bleeding, CKD stage III, who presents with nausea, vomiting, abdominal pain.  Patient states that her abdominal pain started since yesterday, which is located in the epigastric area, constant, moderate, sharp, nonradiating.  It is associated with nausea vomiting, diarrhea.  She vomited once with nonbilious nonbloody vomitus.  She had at least 3 episodes of diarrhea yesterday, and one more episode of loose stool today.  No fever or chills.  Denies chest pain, shortness breath, cough, symptoms of UTI or unilateral weakness.  She has history of alcohol abuse in remission, last drink was about 4 years ago per patient. Pt developed itches and scattered rash on back after giving fentanyl in ED.  ED Course: pt was found to have lipase 58, WBC 6.4, negative urinalysis, alcohol level less than 10, pending Covid PCR, stable renal function, temperature normal, blood pressure 144/81, heart rate 76, RR 16, oxygen saturation 99% on room air.  Patient is placed on MedSurg [facility] for observation.  General surgeon, Dr. Hampton Abbot was consulted  Review of Systems:   General: no fevers, chills, no body weight gain, has poor appetite, has fatigue HEENT: no blurry vision, hearing changes or sore throat Respiratory: no dyspnea, coughing, wheezing CV: no chest pain, no palpitations GI: has nausea, vomiting, abdominal pain, diarrhea, no constipation GU: no dysuria, burning on urination, increased  urinary frequency, hematuria  Ext: no leg edema Neuro: no unilateral weakness, numbness, or tingling, no vision change or hearing loss Skin: has rash, no skin tear. MSK: No muscle spasm, no deformity, no limitation of range of movement in spin Heme: No easy bruising.  Travel history: No recent long distant travel.  Allergy:  Allergies  Allergen Reactions  . Penicillins Anaphylaxis    Has patient had a PCN reaction causing immediate rash, facial/tongue/throat swelling, SOB or lightheadedness with hypotension: Yes Has patient had a PCN reaction causing severe rash involving mucus membranes or skin necrosis: No Has patient had a PCN reaction that required hospitalization No Has patient had a PCN reaction occurring within the last 10 years: No If all of the above answers are "NO", then may proceed with Cephalosporin use.  . Reglan [Metoclopramide] Other (See Comments)    Hypotension, shortness of breath  . Fentanyl     Rash   . Percocet [Oxycodone-Acetaminophen] Rash    plain tylenol can also make itch?    Past Medical History:  Diagnosis Date  . Alcoholic pancreatitis   . Asthma   . Depression   . Diabetes mellitus without complication ([place])   . DKA (diabetic ketoacidoses) ([place]) 02/17/2015  . Emphysematous cystitis   . Hypercholesteremia   . Hypertension   . Hypokalemia     Past Surgical History:  Procedure Laterality Date  . ABDOMINAL HYSTERECTOMY  1996  . APPENDECTOMY  1997  . ESOPHAGOGASTRODUODENOSCOPY N/A 11/12/2017   Procedure: ESOPHAGOGASTRODUODENOSCOPY (EGD);  Surgeon: Lin Landsman, MD;  Location: [facility] ENDOSCOPY;  Service: Gastroenterology;  Laterality: N/A;  . ESOPHAGOGASTRODUODENOSCOPY N/A 09/29/2018  Procedure: ESOPHAGOGASTRODUODENOSCOPY (EGD);  Surgeon: Toledo, Benay Pike, MD;  Location: [facility] ENDOSCOPY;  Service: Gastroenterology;  Laterality: N/A;  . ESOPHAGOGASTRODUODENOSCOPY (EGD) WITH PROPOFOL N/A 04/24/2016   Procedure: ESOPHAGOGASTRODUODENOSCOPY (EGD)  WITH PROPOFOL;  Surgeon: [name], MD;  Location: [facility] ENDOSCOPY;  Service: Endoscopy;  Laterality: N/A;  . ESOPHAGOGASTRODUODENOSCOPY (EGD) WITH PROPOFOL N/A 01/28/2019   Procedure: ESOPHAGOGASTRODUODENOSCOPY (EGD) WITH PROPOFOL;  Surgeon: Toledo, Benay Pike, MD;  Location: [facility] ENDOSCOPY;  Service: Gastroenterology;  Laterality: N/A;  . HAND SURGERY  1988  . THYROID SURGERY  2013    Social History:  reports that she has quit smoking. She has never used smokeless tobacco. She reports previous alcohol use. She reports that she does not use drugs.  Family History: tired to have reviewed with patient, but patient does not know any family medical history. Family History  Family history unknown: Yes     Prior to Admission medications   Medication Sig Start Date End Date Taking? Authorizing Provider  amLODipine (NORVASC) 10 MG tablet Take 10 mg by mouth daily.   Yes [provider]  HYDROcodone-acetaminophen (NORCO/VICODIN) 5-325 MG tablet Take 1 tablet by mouth 4 (four) times daily as needed for moderate pain.   Yes [provider]  insulin aspart (NOVOLOG) 100 UNIT/ML injection Inject 4 Units into the skin 3 (three) times daily with meals. Patient taking differently: Inject 15 Units into the skin 2 (two) times daily.  01/29/19  Yes Wieting, Richard, MD  lipase/protease/amylase (CREON) 36000 UNITS CPEP capsule Take 2 capsules (72,000 Units total) by mouth 3 (three) times daily with meals. 01/29/19  Yes Wieting, Richard, MD  pregabalin (LYRICA) 150 MG capsule Take 150 mg by mouth 2 (two) times daily.   Yes [provider]  rosuvastatin (CRESTOR) 20 MG tablet Take 20 mg by mouth daily.   Yes [provider]  nystatin (MYCOSTATIN/NYSTOP) powder Apply topically 2 (two) times daily. Patient taking differently: Apply topically 2 (two) times daily as needed.  01/29/19   Loletha Grayer, MD  zolpidem (AMBIEN) 5 MG tablet Take 1 tablet (5 mg total) by mouth at bedtime  as needed for up to 5 days for sleep. 03/03/19 03/08/19  Wyvonnia Dusky, MD  GLUCERNA [facility]) LIQD Take 237 mLs by mouth 3 (three) times daily between meals. 02/19/15 02/27/15  Bettey Costa, MD    Physical Exam: Vitals:   03/15/19 0535 03/15/19 0732 03/15/19 0734 03/15/19 0916  BP:   (!) 144/81 137/77  Pulse:   76 69  Resp:   16 17  Temp:   98.3 F (36.8 C)   TempSrc:   Oral   SpO2:  99% 99% 99%  Weight: 49.9 kg     Height: 5\' 1"  (1.549 m)      General: Not in acute distress HEENT:       Eyes: PERRL, EOMI, no scleral icterus.       ENT: No discharge from the ears and nose, no pharynx injection, no tonsillar enlargement.        Neck: No JVD, no bruit, no mass felt. Heme: No neck lymph node enlargement. Cardiac: S1/S2, RRR, No murmurs, No gallops or rubs. Respiratory: No rales, wheezing, rhonchi or rubs. GI: Soft, nondistended, has tenderness in epigastric area, no rebound pain, no organomegaly, BS present. GU: No hematuria Ext: No pitting leg edema bilaterally. 2+DP/PT pulse bilaterally. Musculoskeletal: No joint deformities, No joint redness or warmth, no limitation of ROM in spin. Skin: has itchy scattered rashes in back Neuro: Alert, oriented  X3, cranial nerves II-XII grossly intact, moves all extremities normally. Psych: Patient is not psychotic, no suicidal or hemocidal ideation.  Labs on Admission: I have personally reviewed following labs and imaging studies  CBC: Recent Labs  Lab 03/15/19 0545  WBC 6.4  HGB 12.4  HCT 38.0  MCV 85.8  PLT [id number]   Basic Metabolic Panel: Recent Labs  Lab 03/15/19 0545  NA 134*  K 4.3  CL 98  CO2 25  GLUCOSE 354*  BUN 20  CREATININE 1.43*  CALCIUM 9.1   GFR: Estimated Creatinine Clearance: 30.8 mL/min (A) (by C-G formula based on SCr of 1.43 mg/dL (H)). Liver Function Tests: Recent Labs  Lab 03/15/19 0545  AST 36  ALT 42  ALKPHOS 284*  BILITOT 0.7  PROT 8.3*  ALBUMIN 4.2   Recent Labs  Lab 03/15/19 0545    LIPASE 58*   No results for input(s): AMMONIA in the last 168 hours. Coagulation Profile: No results for input(s): INR, PROTIME in the last 168 hours. Cardiac Enzymes: No results for input(s): CKTOTAL, CKMB, CKMBINDEX, TROPONINI in the last 168 hours. BNP (last 3 results) No results for input(s): PROBNP in the last 8760 hours. HbA1C: No results for input(s): HGBA1C in the last 72 hours. CBG: Recent Labs  Lab 03/15/19 0614  GLUCAP 348*   Lipid Profile: No results for input(s): CHOL, HDL, LDLCALC, TRIG, CHOLHDL, LDLDIRECT in the last 72 hours. Thyroid Function Tests: No results for input(s): TSH, T4TOTAL, FREET4, T3FREE, THYROIDAB in the last 72 hours. Anemia Panel: No results for input(s): VITAMINB12, FOLATE, FERRITIN, TIBC, IRON, RETICCTPCT in the last 72 hours. Urine analysis:    Component Value Date/Time   COLORURINE YELLOW (A) 03/15/2019 0545   APPEARANCEUR CLEAR (A) 03/15/2019 0545   APPEARANCEUR Hazy 08/11/2013 0729   LABSPEC 1.010 03/15/2019 0545   LABSPEC 1.030 08/11/2013 0729   PHURINE 6.0 03/15/2019 0545   GLUCOSEU >=500 (A) 03/15/2019 0545   GLUCOSEU >=500 08/11/2013 0729   HGBUR NEGATIVE 03/15/2019 0545   BILIRUBINUR NEGATIVE 03/15/2019 0545   BILIRUBINUR Negative 08/11/2013 0729   KETONESUR NEGATIVE 03/15/2019 0545   PROTEINUR 100 (A) 03/15/2019 0545   NITRITE NEGATIVE 03/15/2019 0545   LEUKOCYTESUR NEGATIVE 03/15/2019 0545   LEUKOCYTESUR Negative 08/11/2013 0729   Sepsis Labs: @LABRCNTIP (procalcitonin:4,lacticidven:4) )No results found for this or any previous visit (from the past 240 hour(s)).   Radiological Exams on Admission: No results found.   EKG: Independently reviewed.  Sinus rhythm, QTC 434, LAE, early R wave progression  Assessment/Plan Principal Problem:   Abdominal pain Active Problems:   ETOH abuse   Chronic pancreatitis (HCC)   HLD (hyperlipidemia)   GERD (gastroesophageal reflux disease)   CKD (chronic kidney disease), stage  IIIa   HTN (hypertension)   Type II diabetes mellitus with renal manifestations (HCC)   Abdominal pain: Patient also has nausea, vomiting, mild diarrhea.  Etiology is not clear.  Possibly due to pancreatitis flareup, but lipase is only 58. US-RUQ on 01/27/19 showed mild gallbladder sludge, mild gallbladder wall thickening. Will consult general surgeon, Dr. Hampton Abbot.  -will place on med-surg bed for obs -message sent to Dr. Hampton Abbot for consult. He recommended to get ultrasound for further evaluation.  Call back as needed.  -prn dilaudid for pain, Zofran for nausea -IV fluid: 1 L normal saline in ED, followed by 125 cc/h -N.p.o. -will check C diff prr -US-RUQ  Hx of ETOH abuse: In remission in past 4 years.  No signs of alcohol withdrawal -Observe closely  HLD (hyperlipidemia): -Crestor  GERD (gastroesophageal reflux disease) -IV Pepcid  CKD (chronic kidney disease), stage IIIa: Stable.  Baseline creatinine 1.3-1.5.  Her creatinine is 1.43, BUN 20. -Follow-up of a BMP  HTN (hypertension) -Continue home amlodipine -prn hydralazine  Type II diabetes mellitus with renal manifestations ([place]):  Most recent A1c 11.1, poorly controled. Patient is taking NovoLog at home.  Blood sugar 354  By BMP, anion gap normal. -will add lantus 5 units daily  -SSI   Hx of  chronic pancreatitis: -continue creon  Rashes: Possibly due to allergy to fentanyl -As needed Benadryl   DVT ppx: SQ Heparin    Code Status: Full code Family Communication: not done, no family member is at bed side.   Disposition Plan:  Anticipate discharge back to previous home environment Consults called: Dr. Hampton Abbot of general surgeon  admission status: Med-surg bed for obs   Date of Service 03/15/2019    [facility]   If 7PM-7AM, please contact night-coverage [email or web address] 03/15/2019, 10:04 AM

## 2019-03-15 NOTE — ED Notes (Signed)
Patient is having pruritis with small rash  on back, upper and lower, after administration of Fentanyl. No difficulty breathing, no oral swelling noted. Patient only complain is "itching".

## 2019-03-15 NOTE — ED Triage Notes (Signed)
Pt presents to ED with headache, abd pain, and n/v. Has vomited 1 time. Onset Tuesday. Hx of the same.

## 2019-03-15 NOTE — Progress Notes (Signed)
Inpatient Diabetes Program Recommendations  AACE/ADA: New Consensus Statement on Inpatient Glycemic Control (2015)  Target Ranges:  Prepandial:   less than 140 mg/dL      Peak postprandial:   less than 180 mg/dL (1-2 hours)      Critically ill patients:  140 - 180 mg/dL   Lab Results  Component Value Date   GLUCAP 105 (H) 03/15/2019   HGBA1C 11.1 (H) 02/27/2019    Review of Glycemic Control  Inpatient Diabetes Program Recommendations:   During last hospitalization patient was switched from 70/30 insulin mix to basal bolus insulin regimen, but was discharged on Novolog 4 units tid meal coverage only.  Lantus 20 units daily Novolog 4 units tid meal coverage if eats 50%  Lantus Solostar insulin pen order # [id number] Insulin pen needles order # [id number] Novolog Flex pen order # [id number]  Thank you, Nani Gasser. Jadda Hunsucker, RN, MSN, CDE  Diabetes Coordinator Inpatient Glycemic Control Team Team Pager [phone] (8am-5pm) 03/15/2019 10:46 AM

## 2019-03-16 DIAGNOSIS — E785 Hyperlipidemia, unspecified: Secondary | ICD-10-CM | POA: Diagnosis not present

## 2019-03-16 DIAGNOSIS — K859 Acute pancreatitis without necrosis or infection, unspecified: Secondary | ICD-10-CM | POA: Diagnosis not present

## 2019-03-16 DIAGNOSIS — N182 Chronic kidney disease, stage 2 (mild): Secondary | ICD-10-CM | POA: Diagnosis not present

## 2019-03-16 DIAGNOSIS — I10 Essential (primary) hypertension: Secondary | ICD-10-CM | POA: Diagnosis not present

## 2019-03-16 DIAGNOSIS — K861 Other chronic pancreatitis: Secondary | ICD-10-CM

## 2019-03-16 LAB — COMPREHENSIVE METABOLIC PANEL
ALT: 32 U/L (ref 0–44)
AST: 32 U/L (ref 15–41)
Albumin: 3.3 g/dL — ABNORMAL LOW (ref 3.5–5.0)
Alkaline Phosphatase: 187 U/L — ABNORMAL HIGH (ref 38–126)
Anion gap: 8 (ref 5–15)
BUN: 13 mg/dL (ref 8–23)
CO2: 23 mmol/L (ref 22–32)
Calcium: 8.6 mg/dL — ABNORMAL LOW (ref 8.9–10.3)
Chloride: 112 mmol/L — ABNORMAL HIGH (ref 98–111)
Creatinine, Ser: 0.98 mg/dL (ref 0.44–1.00)
GFR calc Af Amer: >60 mL/min (ref >60)
GFR calc non Af Amer: >60 mL/min (ref >60)
Glucose, Bld: 132 mg/dL — ABNORMAL HIGH (ref 70–99)
Potassium: 3.8 mmol/L (ref 3.5–5.1)
Sodium: 143 mmol/L (ref 135–145)
Total Bilirubin: 0.5 mg/dL (ref 0.3–1.2)
Total Protein: 6.4 g/dL — ABNORMAL LOW (ref 6.5–8.1)

## 2019-03-16 LAB — CBC
HCT: 34.2 % — ABNORMAL LOW (ref 36.0–46.0)
Hemoglobin: 11.4 g/dL — ABNORMAL LOW (ref 12.0–15.0)
MCH: 28.4 pg (ref 26.0–34.0)
MCHC: 33.3 g/dL (ref 30.0–36.0)
MCV: 85.1 fL (ref 80.0–100.0)
Platelets: 280 10*3/uL (ref 150–400)
RBC: 4.02 MIL/uL (ref 3.87–5.11)
RDW: 12.2 % (ref 11.5–15.5)
WBC: 6.8 10*3/uL (ref 4.0–10.5)
nRBC: 0 % (ref 0.0–0.2)

## 2019-03-16 LAB — GLUCOSE, CAPILLARY
Glucose-Capillary: 118 mg/dL — ABNORMAL HIGH (ref 70–99)
Glucose-Capillary: 152 mg/dL — ABNORMAL HIGH (ref 70–99)
Glucose-Capillary: 153 mg/dL — ABNORMAL HIGH (ref 70–99)
Glucose-Capillary: 229 mg/dL — ABNORMAL HIGH (ref 70–99)

## 2019-03-16 MED ORDER — INFLUENZA VAC SPLIT QUAD 0.5 ML IM SUSY: 0.5000 mL | PREFILLED_SYRINGE | INTRAMUSCULAR | Status: DC

## 2019-03-16 MED ORDER — INSULIN GLARGINE 100 UNIT/ML ~~LOC~~ SOLN: 20.0000 [IU] | Freq: Every day | SUBCUTANEOUS | Status: DC

## 2019-03-16 MED ORDER — INSULIN GLARGINE 100 UNIT/ML ~~LOC~~ SOLN: 20.0000 [IU] | Freq: Every day | SUBCUTANEOUS | 11 refills | Status: DC

## 2019-03-16 MED ORDER — INFLUENZA VAC SPLIT QUAD 0.5 ML IM SUSY
0.5000 mL | PREFILLED_SYRINGE | INTRAMUSCULAR | Status: AC
  Administered 2019-03-16: 14:00:00 0.5 mL via INTRAMUSCULAR
  Filled 2019-03-16: qty 0.5

## 2019-03-16 NOTE — Discharge Instructions (Signed)
Acute Pancreatitis  The pancreas is a gland that is located behind the stomach on the left side of the abdomen. It produces enzymes that help to digest food. The pancreas also releases the hormones glucagon and insulin, which help to regulate blood sugar. Acute pancreatitis happens when inflammation of the pancreas suddenly occurs and the pancreas becomes irritated and swollen. Most acute attacks last a few days and cause serious problems. Some people become dehydrated and develop low blood pressure. In severe cases, bleeding in the abdomen can lead to shock and can be life-threatening. The lungs, heart, and kidneys may fail. What are the causes? This condition may be caused by:  Alcohol abuse.  Drug abuse.  Gallstones or other conditions that can block the tube that drains the pancreas (pancreatic duct).  A tumor in the pancreas. Other causes include:  Certain medicines.  Exposure to certain chemicals.  Diabetes.  An infection in the pancreas.  Damage caused by an accident (trauma).  The poison (venom) from a scorpion bite.  Abdominal surgery.  Autoimmune pancreatitis. This is when the body's disease-fighting (immune) system attacks the pancreas.  Genes that are passed from parent to child (inherited). In some cases, the cause of this condition is not known. What are the signs or symptoms? Symptoms of this condition include:  Pain in the upper abdomen that may radiate to the back. Pain may be severe.  Tenderness and swelling of the abdomen.  Nausea and vomiting.  Fever. How is this diagnosed? This condition may be diagnosed based on:  A physical exam.  Blood tests.  Imaging tests, such as X-rays, CT or MRI scans, or an ultrasound of the abdomen. How is this treated? Treatment for this condition usually requires a stay in the hospital. Treatment for this condition may include:  Pain medicine.  Fluid replacement through an IV.  Placing a tube in the  stomach to remove stomach contents and to control vomiting (NG tube, or nasogastric tube).  Not eating for 3-4 days. This gives the pancreas a rest, because enzymes are not being produced that can cause further damage.  Antibiotic medicines, if your condition is caused by an infection.  Treating any underlying conditions that may be the cause.  Steroid medicines, if your condition is caused by your immune system attacking your body's own tissues (autoimmune disease).  Surgery on the pancreas or gallbladder. Follow these instructions at home: Eating and drinking   Follow instructions from your health care provider about diet. This may involve avoiding alcohol and decreasing the amount of fat in your diet.  Eat smaller, more frequent meals. This reduces the amount of digestive fluids that the pancreas produces.  Drink enough fluid to keep your urine pale yellow.  Do not drink alcohol if it caused your condition. General instructions  Take over-the-counter and prescription medicines only as told by your health care provider.  Do not drive or use heavy machinery while taking prescription pain medicine.  Ask your health care provider if the medicine prescribed to you can cause constipation. You may need to take steps to prevent or treat constipation, such as: ? Take an over-the-counter or prescription medicine for constipation. ? Eat foods that are high in fiber such as whole grains and beans. ? Limit foods that are high in fat and processed sugars, such as fried or sweet foods.  Do not use any products that contain nicotine or tobacco, such as cigarettes, e-cigarettes, and chewing tobacco. If you need help quitting, ask  your health care provider.  Get plenty of rest.  If directed, check your blood sugar at home as told by your health care provider.  Keep all follow-up visits as told by your health care provider. This is important. Contact a health care provider if you:  Do not  recover as quickly as expected.  Develop new or worsening symptoms.  Have persistent pain, weakness, or nausea.  Recover and then have another episode of pain.  Have a fever. Get help right away if:  You cannot eat or keep fluids down.  Your pain becomes severe.  Your skin or the white part of your eyes turns yellow (jaundice).  You have sudden swelling in your abdomen.  You vomit.  You feel dizzy or you faint.  Your blood sugar is high (over 300 mg/dL). Summary  Acute pancreatitis happens when inflammation of the pancreas suddenly occurs and the pancreas becomes irritated and swollen.  This condition is typically caused by alcohol abuse, drug abuse, or gallstones.  Treatment for this condition usually requires a stay in the hospital. This information is not intended to replace advice given to you by your health care provider. Make sure you discuss any questions you have with your health care provider. Document Revised: 10/11/2017 Document Reviewed: 06/28/2017 Elsevier Patient Education  [place] log of your sugars at home and take your insulin as directed

## 2019-03-16 NOTE — Discharge Summary (Signed)
[facility] NAME: Karen [name]    MR#:  [id number]  DATE OF BIRTH:  03-Jul-1955  DATE OF ADMISSION:  03/15/2019 ADMITTING PHYSICIAN: Ivor Costa, MD  DATE OF DISCHARGE: 03/16/2019  PRIMARY CARE PHYSICIAN: Lavera Guise, MD    ADMISSION DIAGNOSIS:  Pancreatitis [K85.90] Epigastric abdominal pain [R10.13] Abdominal pain [R10.9]  DISCHARGE DIAGNOSIS:  Acute on Chronic Mild Pancreatitis  SECONDARY DIAGNOSIS:   Past Medical History:  Diagnosis Date  . Alcoholic pancreatitis   . Asthma   . Depression   . Diabetes mellitus without complication ([place])   . DKA (diabetic ketoacidoses) ([place]) 02/17/2015  . Emphysematous cystitis   . Hypercholesteremia   . Hypertension   . Hypokalemia     HOSPITAL COURSE:  Karen [name] is a 64 y.o. female with medical history significant of alcohol abuse in remission, alcoholic pancreatitis, hyperlipidemia, hypertension, diabetes mellitus, depression,CKD-II, asthma, DKA, emphysematous cystitis, GI bleeding,who presents with nausea, vomiting, abdominal pain.  Abdominal pain: Patient also has nausea, vomiting, mild diarrhea.  Etiology likely acute on chronic Pancreatitis  - Possibly due to pancreatitis flareup, but lipase is only 58--32 - pt able to tolerated soft diet. No vomiting -US-RUQ on 03/16/19 showed No gallstones or gallbladder wall thickening. No pericholecystic fluid. The sonographer reports no sonographic Murphy's sign.Common bile duct: Diameter: Dilated at 10 mm diameter Heterogeneous liver parenchyma with 13 mm hyperechoic subcapsular lesion in the posterior right liver. This is similar to an ultrasound from 01/25/2019  Pt had CBD 9 mm dilated--noted on [facility] in 2018. Liver labs stable. Bilirubin wnl -prn dilaudid for pain, Zofran for nausea -received IV fluid -overall back to baseline  Hx of ETOH abuse: In remission in past 4 years.  No signs of alcohol withdrawal -Observe  closely  HLD (hyperlipidemia): -Crestor  GERD (gastroesophageal reflux disease) -po ppi  CKD (chronic kidney disease), stage II Stable.  Baseline creatinine 1.3-1.5.  Her creatinine is 1.43, BUN 20. -Follow-up of a BMP--creat 0.98 -well hydrated  HTN (hypertension) -Continue home amlodipine -prn hydralazine  Type II diabetes mellitus with renal manifestations ([place]):  Most recent A1c 11.1, poorly controled.  -Lantus 20 units QD and Aspart 4 units tid with meals -spoke with Diabetes coordinator who plans to see pt before discahrge  Hx of  chronic pancreatitis: -continue creon  Rashes: Possibly due to allergy to fentanyl -As needed Benadryl   DVT ppx: SQ Heparin    Code Status: Full code Family Communication: pt's boyfriend aware Disposition Plan:  d/c home today Consults called: none   Pt advised to f/u with PCP CONSULTS OBTAINED:    DRUG ALLERGIES:   Allergies  Allergen Reactions  . Penicillins Anaphylaxis    Has patient had a PCN reaction causing immediate rash, facial/tongue/throat swelling, SOB or lightheadedness with hypotension: Yes Has patient had a PCN reaction causing severe rash involving mucus membranes or skin necrosis: No Has patient had a PCN reaction that required hospitalization No Has patient had a PCN reaction occurring within the last 10 years: No If all of the above answers are "NO", then may proceed with Cephalosporin use.  . Reglan [Metoclopramide] Other (See Comments)    Hypotension, shortness of breath  . Fentanyl     Rash   . Percocet [Oxycodone-Acetaminophen] Rash    plain tylenol can also make itch?    DISCHARGE MEDICATIONS:   Allergies as of 03/16/2019      Reactions   Penicillins Anaphylaxis   Has patient  had a PCN reaction causing immediate rash, facial/tongue/throat swelling, SOB or lightheadedness with hypotension: Yes Has patient had a PCN reaction causing severe rash involving mucus membranes or skin necrosis:  No Has patient had a PCN reaction that required hospitalization No Has patient had a PCN reaction occurring within the last 10 years: No If all of the above answers are "NO", then may proceed with Cephalosporin use.   Reglan [metoclopramide] Other (See Comments)   Hypotension, shortness of breath   Fentanyl    Rash   Percocet [oxycodone-acetaminophen] Rash   plain tylenol can also make itch?      Medication List    TAKE these medications   amLODipine 10 MG tablet Commonly known as: NORVASC Take 10 mg by mouth daily.   HYDROcodone-acetaminophen 5-325 MG tablet Commonly known as: NORCO/VICODIN Take 1 tablet by mouth 4 (four) times daily as needed for moderate pain.   insulin aspart 100 UNIT/ML injection Commonly known as: novoLOG Inject 4 Units into the skin 3 (three) times daily with meals. What changed:   how much to take  when to take this   insulin glargine 100 UNIT/ML injection Commonly known as: LANTUS Inject 0.2 mLs (20 Units total) into the skin daily. Start taking on: March 17, 2019   lipase/protease/amylase 36000 UNITS Cpep capsule Commonly known as: Creon Take 2 capsules (72,000 Units total) by mouth 3 (three) times daily with meals.   nystatin powder Commonly known as: MYCOSTATIN/NYSTOP Apply topically 2 (two) times daily. What changed:   when to take this  reasons to take this   pregabalin 150 MG capsule Commonly known as: LYRICA Take 150 mg by mouth 2 (two) times daily.   rosuvastatin 20 MG tablet Commonly known as: CRESTOR Take 20 mg by mouth daily.   zolpidem 5 MG tablet Commonly known as: AMBIEN Take 1 tablet (5 mg total) by mouth at bedtime as needed for up to 5 days for sleep.       If you experience worsening of your admission symptoms, develop shortness of breath, life threatening emergency, suicidal or homicidal thoughts you must seek medical attention immediately by calling 911 or calling your MD immediately  if symptoms less  severe.  You Must read complete instructions/literature along with all the possible adverse reactions/side effects for all the Medicines you take and that have been prescribed to you. Take any new Medicines after you have completely understood and accept all the possible adverse reactions/side effects.   Please note  You were cared for by a hospitalist during your hospital stay. If you have any questions about your discharge medications or the care you received while you were in the hospital after you are discharged, you can call the unit and asked to speak with the hospitalist on call if the hospitalist that took care of you is not available. Once you are discharged, your primary care physician will handle any further medical issues. Please note that NO REFILLS for any discharge medications will be authorized once you are discharged, as it is imperative that you return to your primary care physician (or establish a relationship with a primary care physician if you do not have one) for your aftercare needs so that they can reassess your need for medications and monitor your lab values. Today   SUBJECTIVE   Feels hungry--wants to try soft food   VITAL SIGNS:  Blood pressure (!) 143/68, pulse 78, temperature 98.1 F (36.7 C), temperature source Oral, resp. rate 18, height 5\' 1"  (  1.549 m), weight 51.3 kg, SpO2 94 %.  I/O:    Intake/Output Summary (Last 24 hours) at 03/16/2019 1132 Last data filed at 03/16/2019 0901 Gross per 24 hour  Intake 1783.5 ml  Output --  Net 1783.5 ml    PHYSICAL EXAMINATION:  GENERAL:  64 y.o.-year-old patient lying in the bed with no acute distress.  EYES: Pupils equal, round, reactive to light and accommodation. No scleral icterus.  HEENT: Head atraumatic, normocephalic. Oropharynx and nasopharynx clear.  NECK:  Supple, no jugular venous distention. No thyroid enlargement, no tenderness.  LUNGS: Normal breath sounds bilaterally, no wheezing, rales,rhonchi or  crepitation. No use of accessory muscles of respiration.  CARDIOVASCULAR: S1, S2 normal. No murmurs, rubs, or gallops.  ABDOMEN: Soft, non-tender, non-distended. Bowel sounds present. No organomegaly or mass.  EXTREMITIES: No pedal edema, cyanosis, or clubbing.  NEUROLOGIC: Cranial nerves II through XII are intact. Muscle strength 5/5 in all extremities. Sensation intact. Gait not checked.  PSYCHIATRIC: The patient is alert and oriented x 3.  SKIN: No obvious rash, lesion, or ulcer.   DATA REVIEW:   CBC  Recent Labs  Lab 03/16/19 0516  WBC 6.8  HGB 11.4*  HCT 34.2*  PLT 280    Chemistries  Recent Labs  Lab 03/16/19 0516  NA 143  K 3.8  CL 112*  CO2 23  GLUCOSE 132*  BUN 13  CREATININE 0.98  CALCIUM 8.6*  AST 32  ALT 32  ALKPHOS 187*  BILITOT 0.5    Microbiology Results   Recent Results (from the past 240 hour(s))  SARS CORONAVIRUS 2 (TAT 6-24 HRS) Nasopharyngeal Nasopharyngeal Swab     Status: None   Collection Time: 03/15/19  9:51 AM   Specimen: Nasopharyngeal Swab  Result Value Ref Range Status   SARS Coronavirus 2 NEGATIVE NEGATIVE Final    Comment: (NOTE) SARS-CoV-2 target nucleic acids are NOT DETECTED. The SARS-CoV-2 RNA is generally detectable in upper and lower respiratory specimens during the acute phase of infection. Negative results do not preclude SARS-CoV-2 infection, do not rule out co-infections with other pathogens, and should not be used as the sole basis for treatment or other patient management decisions. Negative results must be combined with clinical observations, patient history, and epidemiological information. The expected result is Negative. Fact Sheet for Patients: SugarRoll.be Fact Sheet for Healthcare Providers: https://www.woods-mathews.com/ This test is not yet approved or cleared by the [place] FDA and  has been authorized for detection and/or diagnosis of SARS-CoV-2 by FDA under  an Emergency Use Authorization (EUA). This EUA will remain  in effect (meaning this test can be used) for the duration of the COVID-19 declaration under Section 56 4(b)(1) of the Act, 21 U.S.C. section 360bbb-3(b)(1), unless the authorization is terminated or revoked sooner. Performed at [facility], [place] [address], [place], [place] [address]     RADIOLOGY:  US ABDOMEN LIMITED RUQ  Result Date: 03/15/2019 CLINICAL DATA:  Pancreatitis. EXAM: ULTRASOUND ABDOMEN LIMITED RIGHT UPPER QUADRANT COMPARISON:  01/27/2019 FINDINGS: Gallbladder: No gallstones or gallbladder wall thickening. No pericholecystic fluid. The sonographer reports no sonographic Murphy's sign. Common bile duct: Diameter: Dilated at 10 mm diameter. Liver: Heterogeneous echotexture with 13 mm subcapsular echogenic lesion posterior right hepatic dome. Portal vein is patent on color Doppler imaging with normal direction of blood flow towards the liver. Other: None. IMPRESSION: 1. Unremarkable gallbladder with interval development of common bile duct dilatation. MRI/MRCP may be warranted to further evaluate. 2. Heterogeneous liver parenchyma with 13  mm hyperechoic subcapsular lesion in the posterior right liver. This is similar to an ultrasound from 01/25/2019 Electronically Signed   By: Misty Stanley M.D.   On: 03/15/2019 12:03     CODE STATUS:     Code Status Orders  (From admission, onward)         Start     Ordered   03/15/19 1500  Full code  Continuous     03/15/19 1459        Code Status History    Date Active Date Inactive Code Status Order ID Comments User Context   02/26/2019 1529 03/03/2019 1926 Full Code [id number]  Para Skeans, MD ED   01/30/2019 0218 01/31/2019 1721 Full Code [id number]  Athena Masse, MD ED   01/24/2019 1612 01/29/2019 1831 Full Code [id number]  Ivor Costa, MD ED   09/25/2018 1442 09/29/2018 2252 Full Code [id number]  Demetrios Loll, MD ED   11/11/2017 1417 11/13/2017 1948 Full Code [id number]   Hillary Bow, MD ED   04/25/2017 1039 04/26/2017 1415 Full Code [id number]  Schuyler Amor, MD ED   01/03/2017 1730 01/07/2017 1531 Full Code [id number]  Idelle Crouch, MD Inpatient   12/10/2016 1201 12/12/2016 1712 Full Code [id number]  Demetrios Loll, MD Inpatient   10/15/2016 2350 10/17/2016 0056 Full Code [id number]  Dustin Flock, MD Inpatient   04/21/2016 0505 04/24/2016 2031 Full Code [id number]  Saundra Shelling, MD Inpatient   12/31/2015 0739 01/01/2016 2008 Full Code [id number]  Saundra Shelling, MD Inpatient   12/10/2015 1109 12/12/2015 1848 Full Code [id number]  Demetrios Loll, MD Inpatient   10/29/2015 1545 11/01/2015 1623 Full Code [id number]  Theodoro Grist, MD Inpatient   02/17/2015 1704 02/19/2015 1804 Full Code [id number]  Nicholes Mango, MD Inpatient   Advance Care Planning Activity    Advance Directive Documentation     Most Recent Value  Type of Advance Directive  Living will  Pre-existing out of facility DNR order (yellow form or pink MOST form)  --  "MOST" Form in Place?  --       TOTAL TIME TAKING CARE OF THIS PATIENT: *35* minutes.    Fritzi Mandes M.D  [facility]    CC: Primary care physician; Lavera Guise, MD

## 2019-03-16 NOTE — Progress Notes (Signed)
Inpatient Diabetes Program Recommendations  AACE/ADA: New Consensus Statement on Inpatient Glycemic Control (2015)  Target Ranges:  Prepandial:   less than 140 mg/dL      Peak postprandial:   less than 180 mg/dL (1-2 hours)      Critically ill patients:  140 - 180 mg/dL   Lab Results  Component Value Date   GLUCAP 229 (H) 03/16/2019   HGBA1C 11.1 (H) 02/27/2019    Review of Glycemic Control  Inpatient Diabetes Program Recommendations:   Spoke with patient @ bedside and explained basal bolus insulin regimen of giving basal one time daily and meal coverage of Novolog 4 units tid ac meals. Reviewed with patient not to take meal coverage if she is not eating. Patient states she has been taking Novolog 15 units bid to control her CBGs but explained how the basal bolus will manage CBGs throughout the day. Patient able to verbalize hypoglycemia protocol but states she has been overcompensating so her CBGs are very high. Reviewed 15:15 rule with patient discussing to recheck her CBG 15 minutes after 15 gm carbohydrate. Patient states willingness to do basal bolus insulin regimen and discussed with Dr. Posey Pronto. Patient states she has Lantus (received prescription from PCP), Novolog, pen needles, meter, strips @ home. Reviewed how to give injections using the pen.Patient has not been flushing each new needle with 2 units, but willing to start @ home.  Thank you, Karen [name]. Karen Pieratt, RN, MSN, CDE  Diabetes Coordinator Inpatient Glycemic Control Team Team Pager [phone] (8am-5pm) 03/16/2019 12:02 PM

## 2019-04-08 ENCOUNTER — Emergency Department: Payer: Medicaid Other

## 2019-04-08 ENCOUNTER — Encounter: Payer: Self-pay | Admitting: Emergency Medicine

## 2019-04-08 ENCOUNTER — Other Ambulatory Visit: Payer: Self-pay

## 2019-04-08 ENCOUNTER — Emergency Department
Admission: EM | Admit: 2019-04-08 | Discharge: 2019-04-08 | Disposition: A | Discharge status: Home / Self Care | Payer: Medicaid Other | Attending: Student | Admitting: Student

## 2019-04-08 DIAGNOSIS — Z794 Long term (current) use of insulin: Secondary | ICD-10-CM | POA: Diagnosis not present

## 2019-04-08 DIAGNOSIS — R202 Paresthesia of skin: Secondary | ICD-10-CM | POA: Insufficient documentation

## 2019-04-08 DIAGNOSIS — R519 Headache, unspecified: Secondary | ICD-10-CM

## 2019-04-08 DIAGNOSIS — R0602 Shortness of breath: Secondary | ICD-10-CM | POA: Insufficient documentation

## 2019-04-08 DIAGNOSIS — R42 Dizziness and giddiness: Secondary | ICD-10-CM

## 2019-04-08 DIAGNOSIS — Z87891 Personal history of nicotine dependence: Secondary | ICD-10-CM | POA: Insufficient documentation

## 2019-04-08 DIAGNOSIS — Z79899 Other long term (current) drug therapy: Secondary | ICD-10-CM | POA: Diagnosis not present

## 2019-04-08 DIAGNOSIS — N1831 Chronic kidney disease, stage 3a: Secondary | ICD-10-CM | POA: Insufficient documentation

## 2019-04-08 DIAGNOSIS — I129 Hypertensive chronic kidney disease with stage 1 through stage 4 chronic kidney disease, or unspecified chronic kidney disease: Secondary | ICD-10-CM | POA: Diagnosis not present

## 2019-04-08 DIAGNOSIS — E1122 Type 2 diabetes mellitus with diabetic chronic kidney disease: Secondary | ICD-10-CM | POA: Insufficient documentation

## 2019-04-08 LAB — URINALYSIS, COMPLETE (UACMP) WITH MICROSCOPIC
Bacteria, UA: NONE SEEN
Bilirubin Urine: NEGATIVE
Glucose, UA: >=500 mg/dL — AB
Hgb urine dipstick: NEGATIVE
Ketones, ur: NEGATIVE mg/dL
Nitrite: NEGATIVE
Protein, ur: 100 mg/dL — AB
Specific Gravity, Urine: 1.006 (ref 1.005–1.030)
pH: 6 (ref 5.0–8.0)

## 2019-04-08 LAB — BASIC METABOLIC PANEL
Anion gap: 10 (ref 5–15)
BUN: 33 mg/dL — ABNORMAL HIGH (ref 8–23)
CO2: 25 mmol/L (ref 22–32)
Calcium: 9 mg/dL (ref 8.9–10.3)
Chloride: 99 mmol/L (ref 98–111)
Creatinine, Ser: 1.43 mg/dL — ABNORMAL HIGH (ref 0.44–1.00)
GFR calc Af Amer: 45 mL/min — ABNORMAL LOW (ref >60)
GFR calc non Af Amer: 39 mL/min — ABNORMAL LOW (ref >60)
Glucose, Bld: 319 mg/dL — ABNORMAL HIGH (ref 70–99)
Potassium: 5 mmol/L (ref 3.5–5.1)
Sodium: 134 mmol/L — ABNORMAL LOW (ref 135–145)

## 2019-04-08 LAB — GLUCOSE, CAPILLARY: Glucose-Capillary: 134 mg/dL — ABNORMAL HIGH (ref 70–99)

## 2019-04-08 LAB — HEPATIC FUNCTION PANEL
ALT: 99 U/L — ABNORMAL HIGH (ref 0–44)
AST: 71 U/L — ABNORMAL HIGH (ref 15–41)
Albumin: 3.9 g/dL (ref 3.5–5.0)
Alkaline Phosphatase: 389 U/L — ABNORMAL HIGH (ref 38–126)
Bilirubin, Direct: <0.1 mg/dL (ref 0.0–0.2)
Total Bilirubin: 0.5 mg/dL (ref 0.3–1.2)
Total Protein: 7.5 g/dL (ref 6.5–8.1)

## 2019-04-08 LAB — CBC
HCT: 36.9 % (ref 36.0–46.0)
Hemoglobin: 12.3 g/dL (ref 12.0–15.0)
MCH: 29 pg (ref 26.0–34.0)
MCHC: 33.3 g/dL (ref 30.0–36.0)
MCV: 87 fL (ref 80.0–100.0)
Platelets: 247 10*3/uL (ref 150–400)
RBC: 4.24 MIL/uL (ref 3.87–5.11)
RDW: 12.6 % (ref 11.5–15.5)
WBC: 9.2 10*3/uL (ref 4.0–10.5)
nRBC: 0 % (ref 0.0–0.2)

## 2019-04-08 LAB — LIPASE, BLOOD: Lipase: 72 U/L — ABNORMAL HIGH (ref 11–51)

## 2019-04-08 MED ORDER — IBUPROFEN 600 MG PO TABS: 600.0000 mg | ORAL_TABLET | Freq: Four times a day (QID) | ORAL | 0 refills | Status: AC | PRN

## 2019-04-08 MED ORDER — SODIUM CHLORIDE 0.9% FLUSH
3.0000 mL | Freq: Once | INTRAVENOUS | Status: AC
  Administered 2019-04-08: 10:00:00 3 mL via INTRAVENOUS

## 2019-04-08 MED ORDER — INSULIN ASPART 100 UNIT/ML ~~LOC~~ SOLN
5.0000 [IU] | Freq: Once | SUBCUTANEOUS | Status: AC
  Administered 2019-04-08: 5 [IU] via SUBCUTANEOUS
  Filled 2019-04-08: qty 1

## 2019-04-08 MED ORDER — KETOROLAC TROMETHAMINE 15 MG/ML IJ SOLN
15.0000 mg | Freq: Once | INTRAMUSCULAR | Status: AC
  Administered 2019-04-08: 12:00:00 15 mg via INTRAVENOUS
  Filled 2019-04-08: qty 1

## 2019-04-08 MED ORDER — SODIUM CHLORIDE 0.9 % IV BOLUS
1000.0000 mL | Freq: Once | INTRAVENOUS | Status: AC
  Administered 2019-04-08: 10:00:00 1000 mL via INTRAVENOUS

## 2019-04-08 MED ORDER — PROCHLORPERAZINE MALEATE 5 MG PO TABS
5.0000 mg | ORAL_TABLET | Freq: Once | ORAL | Status: AC
  Administered 2019-04-08: 5 mg via ORAL
  Filled 2019-04-08: qty 1

## 2019-04-08 NOTE — Discharge Instructions (Addendum)
Thank you for letting us take care of you in the emergency department today.   Please continue to take any regular, prescribed medications.   New medications we have prescribed:  Ibuprofen 600 mg tablets, take as needed for headache or migraine. Be sure to stay well hydrated when taking this medication.  Please follow up with: Your primary care doctor to review your ER visit and follow up on your symptoms.   Please return to the ER for any new or worsening symptoms.

## 2019-04-08 NOTE — ED Notes (Signed)
Pt ambulatory to the bathroom without assistance.  

## 2019-04-08 NOTE — ED Notes (Signed)
Pt c/o headache that is getting worse. Pt denies nausea at this time. Dr. Joan Mayans, MD notified.

## 2019-04-08 NOTE — ED Triage Notes (Signed)
Pt complains of dizziness, shob, headache since yesterday. Pt assisted to wheelchair. Pt appears in no acute distress.

## 2019-04-08 NOTE — ED Notes (Signed)
Pt transported to MRI 

## 2019-04-08 NOTE — ED Provider Notes (Signed)
[facility] Emergency Department Provider Note  ____________________________________________   First MD Initiated Contact with Patient 04/08/19 [phone]     (approximate)  I have reviewed the triage vital signs and the nursing notes.  History  Chief Complaint Headache and Dizziness    HPI Karen [name] is a 64 y.o. female with a history of DM, alcoholic pancreatitis (sober x several years), HTN, HLD who presents to the emergency department for headache, dizziness, left-sided tingling.  Patient states around 5 AM yesterday morning she experienced a sensation as if she was getting hit in the back of the head and developed a headache after this.  Denies any actual trauma.  Headache has gradually worsened in severity since then.  Currently 9/10 in severity.  Located at the back of her head, pressure and throbbing.  No radiation.  No alleviating or aggravating components.  Associated with the numbness/tingling sensation to the left side of her occiput as well as her LUE and LLE.  She denies any facial droop, speech difficulties, or extremity weakness.  She states her head pain is making her feel short of breath.  She reports a history of dizziness with dehydration, and does admit to decreased intake over the last 24 hours in the setting of her discomfort, however this headache is different from her normal.  She denies any vomiting or diarrhea.   Past Medical Hx Past Medical History:  Diagnosis Date  . Alcoholic pancreatitis   . Asthma   . Depression   . Diabetes mellitus without complication ([place])   . DKA (diabetic ketoacidoses) ([place]) 02/17/2015  . Emphysematous cystitis   . Hypercholesteremia   . Hypertension   . Hypokalemia     Problem List Patient Active Problem List   Diagnosis Date Noted  . Chronic kidney disease (CKD), stage II (mild)   . CKD (chronic kidney disease), stage IIIa 03/15/2019  . HTN (hypertension) 03/15/2019  . Type II diabetes mellitus with  renal manifestations ([place]) 03/15/2019  . Headache 02/26/2019  . Dizziness 02/26/2019  . Hyperglycemia due to type 2 diabetes mellitus ([place]) 01/30/2019  . Acute pancreatitis 01/30/2019  . Constipation   . Acute on chronic pancreatitis ([place]) 01/25/2019  . Acute pain of left shoulder   . Adhesive capsulitis of left shoulder   . Abnormal LFTs 01/24/2019  . Cough 01/24/2019  . Hypertension   . HLD (hyperlipidemia)   . Type 2 diabetes mellitus with hyperlipidemia ([place])   . GERD (gastroesophageal reflux disease)   . Acute renal failure superimposed on stage 3a chronic kidney disease ([place])   . Pancreatitis, recurrent   . Chronic pancreatitis ([place]) 09/25/2018  . 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 Coffee ground emesis   . Upper GI bleed 11/11/2017  . Pancreatitis, acute 01/03/2017  . ETOH abuse 01/03/2017  . UTI (urinary tract infection) 01/03/2017  . Nausea and vomiting   . Esophageal candidiasis ([place])   . Intractable vomiting with nausea   . Hypertensive urgency 04/21/2016  . Abdominal pain   . Nausea vomiting and diarrhea   . Smoker   . Poorly controlled type 2 diabetes mellitus ([place])   . Acute renal insufficiency 10/29/2015  . Elevated troponin 10/29/2015  . Urinary tract infection without hematuria 10/29/2015  . Left flank pain 10/29/2015  . Malignant essential hypertension 10/29/2015  . DKA (diabetic ketoacidoses) ([place]) 02/17/2015  . Emphysematous cystitis     Past Surgical Hx Past Surgical History:  Procedure Laterality Date  . ABDOMINAL HYSTERECTOMY  1996  . APPENDECTOMY  1997  . ESOPHAGOGASTRODUODENOSCOPY N/A 11/12/2017   Procedure: ESOPHAGOGASTRODUODENOSCOPY (EGD);  Surgeon: Lin Landsman, MD;  Location: [facility] ENDOSCOPY;  Service: Gastroenterology;  Laterality: N/A;  . ESOPHAGOGASTRODUODENOSCOPY N/A 09/29/2018   Procedure: ESOPHAGOGASTRODUODENOSCOPY (EGD);  Surgeon: Toledo, Benay Pike, MD;  Location: [facility] ENDOSCOPY;  Service: Gastroenterology;  Laterality:  N/A;  . ESOPHAGOGASTRODUODENOSCOPY (EGD) WITH PROPOFOL N/A 04/24/2016   Procedure: ESOPHAGOGASTRODUODENOSCOPY (EGD) WITH PROPOFOL;  Surgeon: [name], MD;  Location: [facility] ENDOSCOPY;  Service: Endoscopy;  Laterality: N/A;  . ESOPHAGOGASTRODUODENOSCOPY (EGD) WITH PROPOFOL N/A 01/28/2019   Procedure: ESOPHAGOGASTRODUODENOSCOPY (EGD) WITH PROPOFOL;  Surgeon: Toledo, Benay Pike, MD;  Location: [facility] ENDOSCOPY;  Service: Gastroenterology;  Laterality: N/A;  . HAND SURGERY  1988  . THYROID SURGERY  2013    Medications Prior to Admission medications   Medication Sig Start Date End Date Taking? Authorizing Provider  amLODipine (NORVASC) 10 MG tablet Take 10 mg by mouth daily.    [provider]  HYDROcodone-acetaminophen (NORCO/VICODIN) 5-325 MG tablet Take 1 tablet by mouth 4 (four) times daily as needed for moderate pain.    [provider]  insulin aspart (NOVOLOG) 100 UNIT/ML injection Inject 4 Units into the skin 3 (three) times daily with meals. Patient taking differently: Inject 15 Units into the skin 2 (two) times daily.  01/29/19   Loletha Grayer, MD  insulin glargine (LANTUS) 100 UNIT/ML injection Inject 0.2 mLs (20 Units total) into the skin daily. 03/17/19   Fritzi Mandes, MD  lipase/protease/amylase (CREON) 36000 UNITS CPEP capsule Take 2 capsules (72,000 Units total) by mouth 3 (three) times daily with meals. 01/29/19   Loletha Grayer, MD  nystatin (MYCOSTATIN/NYSTOP) powder Apply topically 2 (two) times daily. Patient taking differently: Apply topically 2 (two) times daily as needed.  01/29/19   Loletha Grayer, MD  pregabalin (LYRICA) 150 MG capsule Take 150 mg by mouth 2 (two) times daily.    [provider]  rosuvastatin (CRESTOR) 20 MG tablet Take 20 mg by mouth daily.    [provider]  zolpidem (AMBIEN) 5 MG tablet Take 1 tablet (5 mg total) by mouth at bedtime as needed for up to 5 days for sleep. 03/03/19 03/08/19  Wyvonnia Dusky, MD  GLUCERNA  [facility]) LIQD Take 237 mLs by mouth 3 (three) times daily between meals. 02/19/15 02/27/15  Bettey Costa, MD    Allergies Penicillins, Reglan [metoclopramide], Fentanyl, and Percocet [oxycodone-acetaminophen]  Family Hx Family History  Family history unknown: Yes    Social Hx Social History   Tobacco Use  . Smoking status: Former Research scientist (life sciences)  . Smokeless tobacco: Never Used  . Tobacco comment: quit 7 years ago   Substance Use Topics  . Alcohol use: Not Currently    Comment: quit drinking 2 years ago   . Drug use: No     Review of Systems  Constitutional: Negative for fever. Negative for chills. Eyes: Negative for visual changes. ENT: Negative for sore throat. Cardiovascular: Negative for chest pain. Respiratory: Negative for shortness of breath. Gastrointestinal: Negative for nausea. Negative for vomiting.  Genitourinary: Negative for dysuria. Musculoskeletal: Negative for leg swelling. Skin: Negative for rash. Neurological: Positive for headaches, dizziness.   Physical Exam  Vital Signs: ED Triage Vitals  Enc Vitals Group     BP 04/08/19 0706 (!) 174/69     Pulse Rate 04/08/19 0706 77     Resp 04/08/19 0706 18     Temp 04/08/19 0706 97.6 F (36.4 C)  Temp Source 04/08/19 0706 Tympanic     SpO2 04/08/19 0706 100 %     Weight 04/08/19 0710 115 lb (52.2 kg)     Height 04/08/19 0710 5\' 1"  (1.549 m)     Head Circumference --      Peak Flow --      Pain Score 04/08/19 0709 9     Pain Loc --      Pain Edu? --      Excl. in [place]? --     Constitutional: Alert and oriented. NAD.  Head: Normocephalic. Atraumatic. Eyes: Conjunctivae clear. Sclera anicteric. Pupils equal and symmetric. Nose: No masses or lesions. No congestion or rhinorrhea. Mouth/Throat: Wearing mask.  Neck: No stridor. Trachea midline.  Cardiovascular: Normal rate, regular rhythm. Extremities well perfused. Respiratory: Normal respiratory effort.  Lungs CTAB. Gastrointestinal: Soft.  Non-distended. Non-tender.  Genitourinary: Deferred. Musculoskeletal: No lower extremity edema. No deformities. Neurologic:  Normal speech and language. Alert and oriented.  Face symmetric.  Tongue midline.  Cranial nerves II through XII intact. UE and LE strength 5/5 and symmetric.  Reports decreased sensation to the LUE and LLE compared to the RUE and RLE.  Some subtle difficulty with finger-to-nose and heel-to-shin on the left. Skin: Skin is warm, dry and intact. No rash noted. Psychiatric: Mood and affect are appropriate for situation.  EKG  Personally reviewed and interpreted by myself.   Date: 04/08/2019 Time: 7:09 AM Rate: 76 Rhythm: Sinus Axis: Normal Intervals: Within normal limits No STEMI    Radiology  Personally reviewed available imaging myself.   CT head  IMPRESSION:  No acute finding or explanation for symptoms.    MRI brain  IMPRESSION:  No acute finding or change from MRI 2 months ago.    Procedures  Procedure(s) performed (including critical care):  Procedures   Initial Impression / Assessment and Plan / MDM / ED Course  64 y.o. female who presents to the ED for headache, dizziness, left-sided tingling.  Exam as above.  Ddx: CVA/TIA, atypical or complex migraine, headache, dehydration, UTI, symptomatic hyperglycemia  Will plan for labs, EKG, imaging, symptom control and reassess.  Clinical Course as of Apr 08 1223  Sat Apr 08, 2019  0847 Labs notable for mild hyperglycemia 319, no gap.  Mild AKI with creatinine 1.43.  Will give a dose of insulin as well as fluids.  CXR clear.  UA with glucose and trace LE, otherwise no evidence of infection.   [SM]  [id number] CT negative for acute findings.  We will proceed with MRI.   [SM]  [id number] MRI negative. Blood sugar improved. Headache and other symptoms improved per patient. As such, patient stable for discharge w/ outpatient follow up. She is agreeable w/ plan. Given return precautions.    [SM]    Clinical  Course User Index [SM] Lilia Pro., MD     _______________________________   As part of my medical decision making I have reviewed available labs, radiology tests, reviewed old records/chart review.   Final Clinical Impression(s) / ED Diagnosis  Final diagnoses:  Complaint of headache  Dizziness       Note:  This document was prepared using Dragon voice recognition software and may include unintentional dictation errors.   Lilia Pro., MD 04/08/19 1226

## 2019-06-13 ENCOUNTER — Emergency Department: Payer: Medicaid Other

## 2019-06-13 ENCOUNTER — Other Ambulatory Visit: Payer: Self-pay

## 2019-06-13 ENCOUNTER — Inpatient Hospital Stay
Admission: EM | Admit: 2019-06-13 | Discharge: 2019-06-18 | DRG: 439 | Disposition: A | Discharge status: Home / Self Care | Payer: Medicaid Other | Attending: Internal Medicine | Admitting: Internal Medicine

## 2019-06-13 ENCOUNTER — Encounter: Payer: Self-pay | Admitting: Emergency Medicine

## 2019-06-13 DIAGNOSIS — R109 Unspecified abdominal pain: Secondary | ICD-10-CM | POA: Diagnosis not present

## 2019-06-13 DIAGNOSIS — J45909 Unspecified asthma, uncomplicated: Secondary | ICD-10-CM | POA: Diagnosis present

## 2019-06-13 DIAGNOSIS — N1832 Chronic kidney disease, stage 3b: Secondary | ICD-10-CM | POA: Diagnosis present

## 2019-06-13 DIAGNOSIS — N1831 Chronic kidney disease, stage 3a: Secondary | ICD-10-CM | POA: Diagnosis not present

## 2019-06-13 DIAGNOSIS — R1084 Generalized abdominal pain: Secondary | ICD-10-CM

## 2019-06-13 DIAGNOSIS — E1165 Type 2 diabetes mellitus with hyperglycemia: Secondary | ICD-10-CM | POA: Diagnosis present

## 2019-06-13 DIAGNOSIS — D72829 Elevated white blood cell count, unspecified: Secondary | ICD-10-CM | POA: Diagnosis present

## 2019-06-13 DIAGNOSIS — N179 Acute kidney failure, unspecified: Secondary | ICD-10-CM | POA: Diagnosis present

## 2019-06-13 DIAGNOSIS — R0981 Nasal congestion: Secondary | ICD-10-CM | POA: Diagnosis present

## 2019-06-13 DIAGNOSIS — Z20822 Contact with and (suspected) exposure to covid-19: Secondary | ICD-10-CM | POA: Diagnosis present

## 2019-06-13 DIAGNOSIS — Z880 Allergy status to penicillin: Secondary | ICD-10-CM

## 2019-06-13 DIAGNOSIS — E785 Hyperlipidemia, unspecified: Secondary | ICD-10-CM | POA: Diagnosis not present

## 2019-06-13 DIAGNOSIS — E1122 Type 2 diabetes mellitus with diabetic chronic kidney disease: Secondary | ICD-10-CM | POA: Diagnosis present

## 2019-06-13 DIAGNOSIS — F1010 Alcohol abuse, uncomplicated: Secondary | ICD-10-CM | POA: Diagnosis present

## 2019-06-13 DIAGNOSIS — I10 Essential (primary) hypertension: Secondary | ICD-10-CM | POA: Diagnosis present

## 2019-06-13 DIAGNOSIS — Q453 Other congenital malformations of pancreas and pancreatic duct: Secondary | ICD-10-CM

## 2019-06-13 DIAGNOSIS — R739 Hyperglycemia, unspecified: Secondary | ICD-10-CM

## 2019-06-13 DIAGNOSIS — Z888 Allergy status to other drugs, medicaments and biological substances status: Secondary | ICD-10-CM

## 2019-06-13 DIAGNOSIS — E1169 Type 2 diabetes mellitus with other specified complication: Secondary | ICD-10-CM | POA: Diagnosis present

## 2019-06-13 DIAGNOSIS — E1129 Type 2 diabetes mellitus with other diabetic kidney complication: Secondary | ICD-10-CM | POA: Diagnosis present

## 2019-06-13 DIAGNOSIS — K8681 Exocrine pancreatic insufficiency: Secondary | ICD-10-CM | POA: Diagnosis present

## 2019-06-13 DIAGNOSIS — Z87440 Personal history of urinary (tract) infections: Secondary | ICD-10-CM

## 2019-06-13 DIAGNOSIS — N183 Chronic kidney disease, stage 3 unspecified: Secondary | ICD-10-CM | POA: Diagnosis present

## 2019-06-13 DIAGNOSIS — K859 Acute pancreatitis without necrosis or infection, unspecified: Principal | ICD-10-CM | POA: Diagnosis present

## 2019-06-13 DIAGNOSIS — Z9049 Acquired absence of other specified parts of digestive tract: Secondary | ICD-10-CM

## 2019-06-13 DIAGNOSIS — Z87891 Personal history of nicotine dependence: Secondary | ICD-10-CM

## 2019-06-13 DIAGNOSIS — F329 Major depressive disorder, single episode, unspecified: Secondary | ICD-10-CM | POA: Diagnosis present

## 2019-06-13 DIAGNOSIS — Z79899 Other long term (current) drug therapy: Secondary | ICD-10-CM

## 2019-06-13 DIAGNOSIS — Z794 Long term (current) use of insulin: Secondary | ICD-10-CM

## 2019-06-13 DIAGNOSIS — Z885 Allergy status to narcotic agent status: Secondary | ICD-10-CM

## 2019-06-13 DIAGNOSIS — K861 Other chronic pancreatitis: Secondary | ICD-10-CM

## 2019-06-13 DIAGNOSIS — E7800 Pure hypercholesterolemia, unspecified: Secondary | ICD-10-CM | POA: Diagnosis present

## 2019-06-13 DIAGNOSIS — E1142 Type 2 diabetes mellitus with diabetic polyneuropathy: Secondary | ICD-10-CM | POA: Diagnosis present

## 2019-06-13 DIAGNOSIS — L299 Pruritus, unspecified: Secondary | ICD-10-CM

## 2019-06-13 DIAGNOSIS — R21 Rash and other nonspecific skin eruption: Secondary | ICD-10-CM | POA: Diagnosis present

## 2019-06-13 DIAGNOSIS — I129 Hypertensive chronic kidney disease with stage 1 through stage 4 chronic kidney disease, or unspecified chronic kidney disease: Secondary | ICD-10-CM | POA: Diagnosis present

## 2019-06-13 DIAGNOSIS — R140 Abdominal distension (gaseous): Secondary | ICD-10-CM | POA: Diagnosis present

## 2019-06-13 DIAGNOSIS — F1011 Alcohol abuse, in remission: Secondary | ICD-10-CM | POA: Diagnosis present

## 2019-06-13 HISTORY — DX: Elevated white blood cell count, unspecified: D72.829

## 2019-06-13 LAB — COMPREHENSIVE METABOLIC PANEL
ALT: 40 U/L (ref 0–44)
AST: 28 U/L (ref 15–41)
Albumin: 3.8 g/dL (ref 3.5–5.0)
Alkaline Phosphatase: 307 U/L — ABNORMAL HIGH (ref 38–126)
Anion gap: 10 (ref 5–15)
BUN: 39 mg/dL — ABNORMAL HIGH (ref 8–23)
CO2: 24 mmol/L (ref 22–32)
Calcium: 8.9 mg/dL (ref 8.9–10.3)
Chloride: 101 mmol/L (ref 98–111)
Creatinine, Ser: 1.42 mg/dL — ABNORMAL HIGH (ref 0.44–1.00)
GFR calc Af Amer: 46 mL/min — ABNORMAL LOW (ref >60)
GFR calc non Af Amer: 39 mL/min — ABNORMAL LOW (ref >60)
Glucose, Bld: 428 mg/dL — ABNORMAL HIGH (ref 70–99)
Potassium: 4.9 mmol/L (ref 3.5–5.1)
Sodium: 135 mmol/L (ref 135–145)
Total Bilirubin: 0.6 mg/dL (ref 0.3–1.2)
Total Protein: 7.5 g/dL (ref 6.5–8.1)

## 2019-06-13 LAB — CBC WITH DIFFERENTIAL/PLATELET
Abs Immature Granulocytes: 0.11 10*3/uL — ABNORMAL HIGH (ref 0.00–0.07)
Basophils Absolute: 0.1 10*3/uL (ref 0.0–0.1)
Basophils Relative: 1 %
Eosinophils Absolute: 0.2 10*3/uL (ref 0.0–0.5)
Eosinophils Relative: 2 %
HCT: 38.6 % (ref 36.0–46.0)
Hemoglobin: 13.1 g/dL (ref 12.0–15.0)
Immature Granulocytes: 1 %
Lymphocytes Relative: 18 %
Lymphs Abs: 2.5 10*3/uL (ref 0.7–4.0)
MCH: 28.7 pg (ref 26.0–34.0)
MCHC: 33.9 g/dL (ref 30.0–36.0)
MCV: 84.6 fL (ref 80.0–100.0)
Monocytes Absolute: 1.1 10*3/uL — ABNORMAL HIGH (ref 0.1–1.0)
Monocytes Relative: 7 %
Neutro Abs: 10.2 10*3/uL — ABNORMAL HIGH (ref 1.7–7.7)
Neutrophils Relative %: 71 %
Platelets: 230 10*3/uL (ref 150–400)
RBC: 4.56 MIL/uL (ref 3.87–5.11)
RDW: 11.9 % (ref 11.5–15.5)
WBC: 14.1 10*3/uL — ABNORMAL HIGH (ref 4.0–10.5)
nRBC: 0 % (ref 0.0–0.2)

## 2019-06-13 LAB — URINALYSIS, COMPLETE (UACMP) WITH MICROSCOPIC
Bacteria, UA: NONE SEEN
Bilirubin Urine: NEGATIVE
Glucose, UA: >=500 mg/dL — AB
Hgb urine dipstick: NEGATIVE
Ketones, ur: NEGATIVE mg/dL
Leukocytes,Ua: NEGATIVE
Nitrite: NEGATIVE
Protein, ur: 100 mg/dL — AB
Specific Gravity, Urine: 1.013 (ref 1.005–1.030)
pH: 6 (ref 5.0–8.0)

## 2019-06-13 LAB — GLUCOSE, CAPILLARY
Glucose-Capillary: 108 mg/dL — ABNORMAL HIGH (ref 70–99)
Glucose-Capillary: 108 mg/dL — ABNORMAL HIGH (ref 70–99)
Glucose-Capillary: 122 mg/dL — ABNORMAL HIGH (ref 70–99)
Glucose-Capillary: 159 mg/dL — ABNORMAL HIGH (ref 70–99)

## 2019-06-13 LAB — SARS CORONAVIRUS 2 BY RT PCR (HOSPITAL ORDER, PERFORMED IN ~~LOC~~ HOSPITAL LAB): SARS Coronavirus 2: NEGATIVE

## 2019-06-13 LAB — LIPASE, BLOOD: Lipase: 115 U/L — ABNORMAL HIGH (ref 11–51)

## 2019-06-13 LAB — ETHANOL: Alcohol, Ethyl (B): <10 mg/dL (ref <10)

## 2019-06-13 LAB — BRAIN NATRIURETIC PEPTIDE: B Natriuretic Peptide: 33.4 pg/mL (ref 0.0–100.0)

## 2019-06-13 MED ORDER — QUETIAPINE FUMARATE 25 MG PO TABS
25.0000 mg | ORAL_TABLET | Freq: Every day | ORAL | Status: DC
  Administered 2019-06-13 – 2019-06-17 (×5): 25 mg via ORAL
  Filled 2019-06-13 (×5): qty 1

## 2019-06-13 MED ORDER — HYDROMORPHONE HCL 1 MG/ML IJ SOLN
1.0000 mg | Freq: Once | INTRAMUSCULAR | Status: AC
  Administered 2019-06-13: 1 mg via INTRAVENOUS
  Filled 2019-06-13: qty 1

## 2019-06-13 MED ORDER — HYDROMORPHONE HCL 1 MG/ML IJ SOLN
1.0000 mg | INTRAMUSCULAR | Status: DC | PRN
  Administered 2019-06-13: 1 mg via INTRAVENOUS
  Filled 2019-06-13 (×2): qty 1

## 2019-06-13 MED ORDER — ONDANSETRON HCL 4 MG/2ML IJ SOLN
4.0000 mg | Freq: Three times a day (TID) | INTRAMUSCULAR | Status: DC | PRN
  Administered 2019-06-13 – 2019-06-16 (×3): 4 mg via INTRAVENOUS
  Filled 2019-06-13 (×3): qty 2

## 2019-06-13 MED ORDER — INSULIN ASPART 100 UNIT/ML ~~LOC~~ SOLN
5.0000 [IU] | Freq: Once | SUBCUTANEOUS | Status: AC
  Administered 2019-06-13: 5 [IU] via SUBCUTANEOUS
  Filled 2019-06-13: qty 1

## 2019-06-13 MED ORDER — SODIUM CHLORIDE 0.9 % IV SOLN: Freq: Once | INTRAVENOUS | Status: AC

## 2019-06-13 MED ORDER — HYDROCODONE-ACETAMINOPHEN 5-325 MG PO TABS
1.0000 | ORAL_TABLET | Freq: Four times a day (QID) | ORAL | Status: DC | PRN
  Administered 2019-06-13 – 2019-06-15 (×5): 1 via ORAL
  Filled 2019-06-13 (×6): qty 1

## 2019-06-13 MED ORDER — ROSUVASTATIN CALCIUM 20 MG PO TABS
20.0000 mg | ORAL_TABLET | Freq: Every day | ORAL | Status: DC
  Administered 2019-06-14 – 2019-06-17 (×4): 20 mg via ORAL
  Filled 2019-06-13: qty 1
  Filled 2019-06-13: qty 2
  Filled 2019-06-13: qty 1
  Filled 2019-06-13: qty 2
  Filled 2019-06-13 (×2): qty 1
  Filled 2019-06-13 (×2): qty 2
  Filled 2019-06-13: qty 1

## 2019-06-13 MED ORDER — PANCRELIPASE (LIP-PROT-AMYL) 12000-38000 UNITS PO CPEP
72000.0000 [IU] | ORAL_CAPSULE | Freq: Three times a day (TID) | ORAL | Status: DC
  Administered 2019-06-14 – 2019-06-18 (×13): 72000 [IU] via ORAL
  Filled 2019-06-13 (×15): qty 6

## 2019-06-13 MED ORDER — INSULIN ASPART 100 UNIT/ML ~~LOC~~ SOLN
0.0000 [IU] | SUBCUTANEOUS | Status: DC
  Administered 2019-06-13: 1 [IU] via SUBCUTANEOUS
  Administered 2019-06-13 – 2019-06-14 (×3): 2 [IU] via SUBCUTANEOUS
  Administered 2019-06-15: 1 [IU] via SUBCUTANEOUS
  Administered 2019-06-15: 5 [IU] via SUBCUTANEOUS
  Filled 2019-06-13 (×6): qty 1

## 2019-06-13 MED ORDER — SODIUM CHLORIDE 0.9 % IV SOLN: INTRAVENOUS | Status: DC

## 2019-06-13 MED ORDER — ALBUTEROL SULFATE (2.5 MG/3ML) 0.083% IN NEBU: 3.0000 mL | INHALATION_SOLUTION | RESPIRATORY_TRACT | Status: DC | PRN

## 2019-06-13 MED ORDER — IOHEXOL 300 MG/ML  SOLN
75.0000 mL | Freq: Once | INTRAMUSCULAR | Status: AC | PRN
  Administered 2019-06-13: 75 mL via INTRAVENOUS

## 2019-06-13 MED ORDER — MORPHINE SULFATE (PF) 2 MG/ML IV SOLN
2.0000 mg | INTRAVENOUS | Status: DC | PRN
  Administered 2019-06-13 – 2019-06-18 (×13): 2 mg via INTRAVENOUS
  Filled 2019-06-13 (×13): qty 1

## 2019-06-13 MED ORDER — ACETAMINOPHEN 325 MG PO TABS: 650.0000 mg | ORAL_TABLET | Freq: Four times a day (QID) | ORAL | Status: DC | PRN

## 2019-06-13 MED ORDER — CYCLOBENZAPRINE HCL 10 MG PO TABS
5.0000 mg | ORAL_TABLET | Freq: Two times a day (BID) | ORAL | Status: DC | PRN
  Administered 2019-06-16: 5 mg via ORAL
  Filled 2019-06-13: qty 1

## 2019-06-13 MED ORDER — DOCUSATE SODIUM 100 MG PO CAPS
100.0000 mg | ORAL_CAPSULE | Freq: Two times a day (BID) | ORAL | Status: DC
  Administered 2019-06-13 – 2019-06-18 (×10): 100 mg via ORAL
  Filled 2019-06-13 (×10): qty 1

## 2019-06-13 MED ORDER — HYDRALAZINE HCL 20 MG/ML IJ SOLN: 5.0000 mg | INTRAMUSCULAR | Status: DC | PRN

## 2019-06-13 MED ORDER — ENOXAPARIN SODIUM 40 MG/0.4ML ~~LOC~~ SOLN: 40.0000 mg | SUBCUTANEOUS | Status: DC

## 2019-06-13 MED ORDER — HYDROMORPHONE HCL 1 MG/ML IJ SOLN
0.5000 mg | Freq: Once | INTRAMUSCULAR | Status: AC
  Administered 2019-06-13: 0.5 mg via INTRAVENOUS
  Filled 2019-06-13: qty 1

## 2019-06-13 MED ORDER — AMLODIPINE BESYLATE 10 MG PO TABS
10.0000 mg | ORAL_TABLET | Freq: Every day | ORAL | Status: DC
  Administered 2019-06-13 – 2019-06-18 (×6): 10 mg via ORAL
  Filled 2019-06-13: qty 2
  Filled 2019-06-13 (×5): qty 1

## 2019-06-13 MED ORDER — PREGABALIN 75 MG PO CAPS
200.0000 mg | ORAL_CAPSULE | Freq: Two times a day (BID) | ORAL | Status: DC
  Administered 2019-06-13 – 2019-06-18 (×10): 200 mg via ORAL
  Filled 2019-06-13 (×10): qty 1

## 2019-06-13 MED ORDER — INSULIN GLARGINE 100 UNIT/ML ~~LOC~~ SOLN
20.0000 [IU] | Freq: Every day | SUBCUTANEOUS | Status: DC
  Administered 2019-06-14 – 2019-06-18 (×5): 20 [IU] via SUBCUTANEOUS
  Filled 2019-06-13 (×5): qty 0.2

## 2019-06-13 MED ORDER — ENOXAPARIN SODIUM 40 MG/0.4ML ~~LOC~~ SOLN
30.0000 mg | SUBCUTANEOUS | Status: DC
  Filled 2019-06-13 (×2): qty 0.4

## 2019-06-13 MED ORDER — POLYETHYLENE GLYCOL 3350 17 G PO PACK
17.0000 g | PACK | Freq: Every day | ORAL | Status: DC
  Administered 2019-06-17 – 2019-06-18 (×2): 17 g via ORAL
  Filled 2019-06-13 (×4): qty 1

## 2019-06-13 MED ORDER — ONDANSETRON HCL 4 MG/2ML IJ SOLN
4.0000 mg | Freq: Once | INTRAMUSCULAR | Status: AC
  Administered 2019-06-13: 4 mg via INTRAVENOUS
  Filled 2019-06-13: qty 2

## 2019-06-13 MED ORDER — ROSUVASTATIN CALCIUM 20 MG PO TABS: 20.0000 mg | ORAL_TABLET | Freq: Every day | ORAL | Status: DC

## 2019-06-13 MED ORDER — PANTOPRAZOLE SODIUM 40 MG PO TBEC
40.0000 mg | DELAYED_RELEASE_TABLET | Freq: Every day | ORAL | Status: DC
  Administered 2019-06-13 – 2019-06-18 (×6): 40 mg via ORAL
  Filled 2019-06-13 (×6): qty 1

## 2019-06-13 MED ORDER — POLYETHYLENE GLYCOL 3350 POWD: 1.0000 | Freq: Every day | Status: DC

## 2019-06-13 NOTE — H&P (Signed)
History and Physical    Karen [name] LSL:[id number] DOB: May 01, 1955 DOA: 06/13/2019  Referring MD/NP/PA:   PCP: Lavera Guise, MD   Patient coming from:  The patient is coming from home.  At baseline, pt is independent for most of ADL.        Chief Complaint: Abdominal pain, nausea, vomiting  HPI: Karen [name] is a 64 y.o. female with medical history significant of alcohol abuse in remission, alcoholic pancreatitis, hyperlipidemia, hypertension, diabetes mellitus, depression, asthma, DKA, emphysematous cystitis, GI bleeding, CKD stage III, who presents with nausea, vomiting, abdominal pain.  Patient states that her symptoms started in the early morning, including nausea, vomiting, abdominal pain.  She has had 3-4 times of nonbilious nonbloody vomiting.  No diarrhea.  Abdominal pain is diffuse, constant, moderate, sharp, nonradiating.  Denies fever or chills.  Patient denies chest pain, shortness breath, cough.  She states she has mild dysuria sometimes, no burning on urination or increased urinary frequency.  ED Course: pt was found to have lipase 115, pending COVID-19 PCR, stable renal function, liver function (ALP 307, AST 28, ALT 40, total bilirubin 0.6), temperature normal, blood pressure 142/76, heart rate 69, RR 20, oxygen saturation 96% on room air.  Patient is placed on MedSurg bed for observation  CT abdomen/pelvis showed: 1. Possible constipation. No other explanation for patient's symptoms. 2. Subtle irregular hepatic capsule and caudate lobe prominence, for which mild cirrhosis cannot be excluded. Correlate with risk factors. Right hepatic lobe 8 mm hypoattenuating lesion is similar to on the prior exam and present back to 01/29/2017, favoring a benign etiology. 3. Right nephrolithiasis. 4. Aortic Atherosclerosis (ICD10-I70.0).  5. Suspicion of pancreas divisum. No CT evidence of acute pancreatitis. 6. Pancreas: Suspicion of pancreas divisum, with a prominent dorsal duct entering  the duodenum on 31/2. No duct dilatation or acute inflammation.   Review of Systems:   General: no fevers, chills, no body weight gain, has poor appetite, has fatigue HEENT: no blurry vision, hearing changes or sore throat Respiratory: no dyspnea, coughing, wheezing CV: no chest pain, no palpitations GI: has nausea, vomiting, abdominal pain, no diarrhea, constipation GU: has dysuria, no burning on urination, increased urinary frequency, hematuria  Ext: no leg edema Neuro: no unilateral weakness, numbness, or tingling, no vision change or hearing loss Skin: no rash, no skin tear. MSK: No muscle spasm, no deformity, no limitation of range of movement in spin Heme: No easy bruising.  Travel history: No recent long distant travel.  Allergy:  Allergies  Allergen Reactions  . Penicillins Anaphylaxis    Has patient had a PCN reaction causing immediate rash, facial/tongue/throat swelling, SOB or lightheadedness with hypotension: Yes Has patient had a PCN reaction causing severe rash involving mucus membranes or skin necrosis: No Has patient had a PCN reaction that required hospitalization No Has patient had a PCN reaction occurring within the last 10 years: No If all of the above answers are "NO", then may proceed with Cephalosporin use.  . Reglan [Metoclopramide] Other (See Comments)    Hypotension, shortness of breath  . Fentanyl     Rash   . Percocet [Oxycodone-Acetaminophen] Rash    plain tylenol can also make itch?    Past Medical History:  Diagnosis Date  . Alcoholic pancreatitis   . Asthma   . Depression   . Diabetes mellitus without complication ([place])   . DKA (diabetic ketoacidoses) ([place]) 02/17/2015  . Emphysematous cystitis   . Hypercholesteremia   . Hypertension   .  Hypokalemia     Past Surgical History:  Procedure Laterality Date  . ABDOMINAL HYSTERECTOMY  1996  . APPENDECTOMY  1997  . ESOPHAGOGASTRODUODENOSCOPY N/A 11/12/2017   Procedure:  ESOPHAGOGASTRODUODENOSCOPY (EGD);  Surgeon: Lin Landsman, MD;  Location: [facility] ENDOSCOPY;  Service: Gastroenterology;  Laterality: N/A;  . ESOPHAGOGASTRODUODENOSCOPY N/A 09/29/2018   Procedure: ESOPHAGOGASTRODUODENOSCOPY (EGD);  Surgeon: Toledo, Benay Pike, MD;  Location: [facility] ENDOSCOPY;  Service: Gastroenterology;  Laterality: N/A;  . ESOPHAGOGASTRODUODENOSCOPY (EGD) WITH PROPOFOL N/A 04/24/2016   Procedure: ESOPHAGOGASTRODUODENOSCOPY (EGD) WITH PROPOFOL;  Surgeon: [name], MD;  Location: [facility] ENDOSCOPY;  Service: Endoscopy;  Laterality: N/A;  . ESOPHAGOGASTRODUODENOSCOPY (EGD) WITH PROPOFOL N/A 01/28/2019   Procedure: ESOPHAGOGASTRODUODENOSCOPY (EGD) WITH PROPOFOL;  Surgeon: Toledo, Benay Pike, MD;  Location: [facility] ENDOSCOPY;  Service: Gastroenterology;  Laterality: N/A;  . HAND SURGERY  1988  . THYROID SURGERY  2013    Social History:  reports that she has quit smoking. She has never used smokeless tobacco. She reports previous alcohol use. She reports that she does not use drugs.  Family History:  Family History  Family history unknown: Yes     Prior to Admission medications   Medication Sig Start Date End Date Taking? Authorizing Provider  amLODipine (NORVASC) 10 MG tablet Take 10 mg by mouth daily.    [provider]  HYDROcodone-acetaminophen (NORCO/VICODIN) 5-325 MG tablet Take 1 tablet by mouth 4 (four) times daily as needed for moderate pain.    [provider]  insulin aspart (NOVOLOG) 100 UNIT/ML injection Inject 4 Units into the skin 3 (three) times daily with meals. 01/29/19   Loletha Grayer, MD  insulin glargine (LANTUS) 100 UNIT/ML injection Inject 0.2 mLs (20 Units total) into the skin daily. Patient taking differently: Inject 25 Units into the skin daily.  03/17/19   Fritzi Mandes, MD  lipase/protease/amylase (CREON) 36000 UNITS CPEP capsule Take 2 capsules (72,000 Units total) by mouth 3 (three) times daily with meals. 01/29/19   Loletha Grayer, MD    nystatin (MYCOSTATIN/NYSTOP) powder Apply topically 2 (two) times daily. 01/29/19   Loletha Grayer, MD  pregabalin (LYRICA) 150 MG capsule Take 150 mg by mouth 2 (two) times daily.    [provider]  rosuvastatin (CRESTOR) 20 MG tablet Take 20 mg by mouth daily.    [provider]  zolpidem (AMBIEN) 5 MG tablet Take 1 tablet (5 mg total) by mouth at bedtime as needed for up to 5 days for sleep. 03/03/19 04/08/19  Wyvonnia Dusky, MD  GLUCERNA [facility]) LIQD Take 237 mLs by mouth 3 (three) times daily between meals. 02/19/15 02/27/15  Bettey Costa, MD    Physical Exam: Vitals:   06/13/19 0724 06/13/19 0903  BP: (!) 167/89 (!) 142/76  Pulse: 76 69  Resp: 20   Temp: 98.2 F (36.8 C)   TempSrc: Oral   SpO2: 98% 96%  Weight: 56.6 kg   Height: 5\' 1"  (1.549 m)    General: Not in acute distress HEENT:       Eyes: PERRL, EOMI, no scleral icterus.       ENT: No discharge from the ears and nose, no pharynx injection, no tonsillar enlargement.        Neck: No JVD, no bruit, no mass felt. Heme: No neck lymph node enlargement. Cardiac: S1/S2, RRR, No murmurs, No gallops or rubs. Respiratory: No rales, wheezing, rhonchi or rubs. GI: distended, diffusely tender, no rebound pain, no organomegaly, BS present. GU: No hematuria Ext: No pitting leg edema bilaterally.  2+DP/PT pulse bilaterally. Musculoskeletal: No joint deformities, No joint redness or warmth, no limitation of ROM in spin. Skin: No rashes.  Neuro: Alert, oriented X3, cranial nerves II-XII grossly intact, moves all extremities normally.   Psych: Patient is not psychotic, no suicidal or hemocidal ideation.  Labs on Admission: I have personally reviewed following labs and imaging studies  CBC: Recent Labs  Lab 06/13/19 0729  WBC 14.1*  NEUTROABS 10.2*  HGB 13.1  HCT 38.6  MCV 84.6  PLT 235   Basic Metabolic Panel: Recent Labs  Lab 06/13/19 0729  NA 135  K 4.9  CL 101  CO2 24  GLUCOSE 428*  BUN  39*  CREATININE 1.42*  CALCIUM 8.9   GFR: Estimated Creatinine Clearance: 31 mL/min (A) (by C-G formula based on SCr of 1.42 mg/dL (H)). Liver Function Tests: Recent Labs  Lab 06/13/19 0729  AST 28  ALT 40  ALKPHOS 307*  BILITOT 0.6  PROT 7.5  ALBUMIN 3.8   Recent Labs  Lab 06/13/19 0729  LIPASE 115*   No results for input(s): AMMONIA in the last 168 hours. Coagulation Profile: No results for input(s): INR, PROTIME in the last 168 hours. Cardiac Enzymes: No results for input(s): CKTOTAL, CKMB, CKMBINDEX, TROPONINI in the last 168 hours. BNP (last 3 results) No results for input(s): PROBNP in the last 8760 hours. HbA1C: No results for input(s): HGBA1C in the last 72 hours. CBG: No results for input(s): GLUCAP in the last 168 hours. Lipid Profile: No results for input(s): CHOL, HDL, LDLCALC, TRIG, CHOLHDL, LDLDIRECT in the last 72 hours. Thyroid Function Tests: No results for input(s): TSH, T4TOTAL, FREET4, T3FREE, THYROIDAB in the last 72 hours. Anemia Panel: No results for input(s): VITAMINB12, FOLATE, FERRITIN, TIBC, IRON, RETICCTPCT in the last 72 hours. Urine analysis:    Component Value Date/Time   COLORURINE STRAW (A) 06/13/2019 0729   APPEARANCEUR CLEAR (A) 06/13/2019 0729   APPEARANCEUR Hazy 08/11/2013 0729   LABSPEC 1.013 06/13/2019 0729   LABSPEC 1.030 08/11/2013 0729   PHURINE 6.0 06/13/2019 0729   GLUCOSEU >=500 (A) 06/13/2019 0729   GLUCOSEU >=500 08/11/2013 0729   HGBUR NEGATIVE 06/13/2019 0729   BILIRUBINUR NEGATIVE 06/13/2019 0729   BILIRUBINUR Negative 08/11/2013 0729   KETONESUR NEGATIVE 06/13/2019 0729   PROTEINUR 100 (A) 06/13/2019 0729   NITRITE NEGATIVE 06/13/2019 0729   LEUKOCYTESUR NEGATIVE 06/13/2019 0729   LEUKOCYTESUR Negative 08/11/2013 0729   Sepsis Labs: @LABRCNTIP (procalcitonin:4,lacticidven:4) )No results found for this or any previous visit (from the past 240 hour(s)).   Radiological Exams on Admission: CT ABDOMEN PELVIS  W CONTRAST  Result Date: 06/13/2019 CLINICAL DATA:  Abdominal pain starting this morning. Distension. Alcohol use, quitting 5 years ago. Elevated white blood cell count and lipase. EXAM: CT ABDOMEN AND PELVIS WITH CONTRAST TECHNIQUE: Multidetector CT imaging of the abdomen and pelvis was performed using the standard protocol following bolus administration of intravenous contrast. CONTRAST:  24mL OMNIPAQUE IOHEXOL 300 MG/ML  SOLN COMPARISON:  Right upper quadrant ultrasound 03/15/2019. Most recent CT of 01/24/2019. FINDINGS: Lower chest: Bibasilar scarring. Normal heart size without pericardial or pleural effusion. Hepatobiliary: Caudate lobe prominence. Subtle irregular hepatic capsule. A right hepatic lobe 8 mm hypoattenuating lesion is similar to 01/24/2019, nonspecific. Normal gallbladder. The common duct measures 9 mm on 28/2 versus 6 mm on 01/24/2019 (when remeasured). No calcified common duct stone. Pancreas: Suspicion of pancreas divisum, with a prominent dorsal duct entering the duodenum on 31/2. No duct dilatation or acute inflammation. Spleen: Normal in  size, without focal abnormality. Adrenals/Urinary Tract: Normal adrenal glands. Punctate upper pole right renal collecting system calculus. Normal left kidney. No hydronephrosis. Normal urinary bladder. Stomach/Bowel: Normal stomach, without wall thickening. Colonic stool burden suggests constipation. Scattered colonic diverticula. Normal terminal ileum. Appendectomy. Normal small bowel. Vascular/Lymphatic: Aortic and branch vessel atherosclerosis. Patent portal, splenic, hepatic veins. No abdominopelvic adenopathy. Reproductive: Hysterectomy.  No adnexal mass. Other: No significant free fluid.  Mild pelvic floor laxity. Musculoskeletal: Lipoma within the right illiopsoas tendon of 1.7 cm. IMPRESSION: 1. Possible constipation. No other explanation for patient's symptoms. 2. Subtle irregular hepatic capsule and caudate lobe prominence, for which mild  cirrhosis cannot be excluded. Correlate with risk factors. Right hepatic lobe 8 mm hypoattenuating lesion is similar to on the prior exam and present back to 01/29/2017, favoring a benign etiology. 3. Right nephrolithiasis. 4. Aortic Atherosclerosis (ICD10-I70.0). 5. Suspicion of pancreas divisum. No CT evidence of acute pancreatitis. Electronically Signed   By: Abigail Miyamoto M.D.   On: 06/13/2019 10:39     EKG: Not done in ED  Assessment/Plan Principal Problem:   Abdominal pain Active Problems:   ETOH abuse   Chronic pancreatitis (HCC)   HLD (hyperlipidemia)   Acute on chronic pancreatitis (HCC)   CKD (chronic kidney disease), stage IIIa   HTN (hypertension)   Type II diabetes mellitus with renal manifestations (HCC)   Leukocytosis   Abdominal pain: Patient also has nausea, vomiting. Possibly due to pancreatitis flareup, lipase is 115.   -will place on med-surg bed for obs -prn dilaudid and nocor for pain, Zofran for nausea -IV fluid: 1 L normal saline in ED, followed by 75 cc/h  Hx of ETOH abuse: In remission in past 4 years.  No signs of alcohol withdrawal -Observe closely  HLD (hyperlipidemia): -Crestor  CKD (chronic kidney disease), stage IIIa: Stable.  Baseline creatinine 1.3-1.5.  Her creatinine is 1.42, BUN 39. -Follow-up BMP  HTN (hypertension) -Continue home amlodipine -prn hydralazine  Type II diabetes mellitus with renal manifestations ([place]):  Most recent A1c 11.1, poorly controled. Patient is taking 70/30 insulin and Lantus at home.  -will decrease Lantus dose from 25 to 20 units -SSI   Hx of  chronic pancreatitis: -continue creon  Leukocytosis: WBC 14.1.  No fever.  No source of infection identified, possibly reactive -f/u by CBC        DVT ppx:  SQ Lovenox Code Status: Full code Family Communication: not done, no family member is at bed side.      Disposition Plan:  Anticipate discharge back to previous environment Consults called:   none Admission status: Med-surg bed for obs   Status is: Observation  The patient remains OBS appropriate and will d/c before 2 midnights.  Dispo: The patient is from: Home              Anticipated d/c is to: Home              Anticipated d/c date is: 1 day              Patient currently is not medically stable to d/c.          Date of Service 06/13/2019    [facility]   If 7PM-7AM, please contact night-coverage [email or web address] 06/13/2019, 11:08 AM

## 2019-06-13 NOTE — Plan of Care (Signed)
  Problem: Activity: Goal: Interest or engagement in activities will improve Outcome: Progressing Goal: Sleeping patterns will improve Outcome: Progressing   Problem: Education: Goal: Knowledge of [facility] Education information/materials will improve Outcome: Progressing Goal: Emotional status will improve Outcome: Progressing Goal: Mental status will improve Outcome: Progressing Goal: Verbalization of understanding the information provided will improve Outcome: Progressing   

## 2019-06-13 NOTE — ED Triage Notes (Signed)
C/o abdominal pain starting at 430 AM today. No NVD. Abdomen distended. Hx of heavy alcohol use but quit 5 years ago per pt

## 2019-06-13 NOTE — Progress Notes (Signed)
PHARMACIST - PHYSICIAN COMMUNICATION  CONCERNING:  Enoxaparin (Lovenox) for DVT Prophylaxis    RECOMMENDATION: Patient was prescribed enoxaprin 40mg  q24 hours for VTE prophylaxis.   Filed Weights   06/13/19 0724  Weight: 56.6 kg (124 lb 12.5 oz)    Body mass index is 23.58 kg/m.  Estimated Creatinine Clearance: 31 mL/min (A) (by C-G formula based on SCr of 1.42 mg/dL (H)).   Patient is candidate for enoxaparin 30mg  every 24 hours based on or Weight less then <57kg for men   DESCRIPTION: Pharmacy has adjusted enoxaparin dose per [facility] policy.  Patient is now receiving enoxaparin 30mg  every 24 hours.  Mayley Lish, PharmD Clinical Pharmacist  06/13/2019 1:08 PM

## 2019-06-13 NOTE — ED Provider Notes (Signed)
ER Provider Note       Time seen: 7:25 AM    I have reviewed the vital signs and the nursing notes.  HISTORY   Chief Complaint Abdominal Pain    HPI Karen [name] is a 64 y.o. female with a history of alcoholic pancreatitis, asthma, depression, diabetes, DKA, hyperlipidemia, hypertension, hypokalemia who presents today for abdominal pain that started at 430 this morning.  She has not had nausea, vomiting or diarrhea.  She presents with distended abdomen, has a history of heavy alcohol use but quit 5 years ago.  Patient notes she has chronic pancreatitis, also has frequent UTIs.  Past Medical History:  Diagnosis Date  . Alcoholic pancreatitis   . Asthma   . Depression   . Diabetes mellitus without complication ([place])   . DKA (diabetic ketoacidoses) ([place]) 02/17/2015  . Emphysematous cystitis   . Hypercholesteremia   . Hypertension   . Hypokalemia     Past Surgical History:  Procedure Laterality Date  . ABDOMINAL HYSTERECTOMY  1996  . APPENDECTOMY  1997  . ESOPHAGOGASTRODUODENOSCOPY N/A 11/12/2017   Procedure: ESOPHAGOGASTRODUODENOSCOPY (EGD);  Surgeon: Lin Landsman, MD;  Location: [facility] ENDOSCOPY;  Service: Gastroenterology;  Laterality: N/A;  . ESOPHAGOGASTRODUODENOSCOPY N/A 09/29/2018   Procedure: ESOPHAGOGASTRODUODENOSCOPY (EGD);  Surgeon: Toledo, Benay Pike, MD;  Location: [facility] ENDOSCOPY;  Service: Gastroenterology;  Laterality: N/A;  . ESOPHAGOGASTRODUODENOSCOPY (EGD) WITH PROPOFOL N/A 04/24/2016   Procedure: ESOPHAGOGASTRODUODENOSCOPY (EGD) WITH PROPOFOL;  Surgeon: [name], MD;  Location: [facility] ENDOSCOPY;  Service: Endoscopy;  Laterality: N/A;  . ESOPHAGOGASTRODUODENOSCOPY (EGD) WITH PROPOFOL N/A 01/28/2019   Procedure: ESOPHAGOGASTRODUODENOSCOPY (EGD) WITH PROPOFOL;  Surgeon: Toledo, Benay Pike, MD;  Location: [facility] ENDOSCOPY;  Service: Gastroenterology;  Laterality: N/A;  . HAND SURGERY  1988  . THYROID SURGERY  2013    Allergies Penicillins, Reglan  [metoclopramide], Fentanyl, and Percocet [oxycodone-acetaminophen]   Review of Systems Constitutional: Negative for fever. Cardiovascular: Negative for chest pain. Respiratory: Negative for shortness of breath. Gastrointestinal: Positive for abdominal pain Musculoskeletal: Negative for back pain. Skin: Negative for rash. Neurological: Negative for headaches, focal weakness or numbness.  All systems negative/normal/unremarkable except as stated in the HPI  ____________________________________________   PHYSICAL EXAM:  VITAL SIGNS: Vitals:   06/13/19 0724  BP: (!) 167/89  Pulse: 76  Resp: 20  Temp: 98.2 F (36.8 C)  SpO2: 98%    Constitutional: Alert and oriented. Well appearing and in no distress. Eyes: Conjunctivae are normal. Normal extraocular movements. ENT      Head: Normocephalic and atraumatic.      Nose: No congestion/rhinnorhea.      Mouth/Throat: Mucous membranes are moist.      Neck: No stridor. Cardiovascular: Normal rate, regular rhythm. No murmurs, rubs, or gallops. Respiratory: Normal respiratory effort without tachypnea nor retractions. Breath sounds are clear and equal bilaterally. No wheezes/rales/rhonchi. Gastrointestinal: Soft and nontender. Normal bowel sounds Musculoskeletal: Nontender with normal range of motion in extremities. No lower extremity tenderness nor edema. Neurologic:  Normal speech and language. No gross focal neurologic deficits are appreciated.  Skin:  Skin is warm, dry and intact. No rash noted. Psychiatric: Speech and behavior are normal.  ____________________________________________  EKG: Interpreted by me.  Sinus rhythm with rate of 76 bpm, normal PR interval, normal QRS, normal QT  ____________________________________________   LABS (pertinent positives/negatives)  Labs Reviewed  CBC WITH DIFFERENTIAL/PLATELET - Abnormal; Notable for the following components:      Result Value   WBC 14.1 (*)    Neutro Abs  10.2 (*)     Monocytes Absolute 1.1 (*)    Abs Immature Granulocytes 0.11 (*)    All other components within normal limits  COMPREHENSIVE METABOLIC PANEL - Abnormal; Notable for the following components:   Glucose, Bld 428 (*)    BUN 39 (*)    Creatinine, Ser 1.42 (*)    Alkaline Phosphatase 307 (*)    GFR calc non Af Amer 39 (*)    GFR calc Af Amer 46 (*)    All other components within normal limits  LIPASE, BLOOD - Abnormal; Notable for the following components:   Lipase 115 (*)    All other components within normal limits  URINALYSIS, COMPLETE (UACMP) WITH MICROSCOPIC - Abnormal; Notable for the following components:   Color, Urine STRAW (*)    APPearance CLEAR (*)    Glucose, UA >=500 (*)    Protein, ur 100 (*)    All other components within normal limits  ETHANOL    RADIOLOGY  Images were viewed by me CT abd/pelvis IMPRESSION: 1. Possible constipation. No other explanation for patient's symptoms. 2. Subtle irregular hepatic capsule and caudate lobe prominence, for which mild cirrhosis cannot be excluded. Correlate with risk factors. Right hepatic lobe 8 mm hypoattenuating lesion is similar to on the prior exam and present back to 01/29/2017, favoring a benign etiology. 3. Right nephrolithiasis. 4. Aortic Atherosclerosis (ICD10-I70.0). 5. Suspicion of pancreas divisum. No CT evidence of acute pancreatitis.  DIFFERENTIAL DIAGNOSIS  Chronic pancreatitis, gastritis, GERD, obstruction, cirrhosis  ASSESSMENT AND PLAN  Abdominal pain, acute on chronic pancreatitis, constipation, hyperglycemia   Plan: The patient had presented for acute abdominal pain. Patient's labs indicated leukocytosis, acute on chronic pancreatitis and hyperglycemia.  No indications of DKA.  She was given fluids, antiemetics and narcotics for pain.  CT did not reveal any specific etiology.  Patient states she does not feel well enough to go home.  I will discuss with the hospitalist for admission.  Lenise Arena MD    Note: This note was generated in part or whole with voice recognition software. Voice recognition is usually quite accurate but there are transcription errors that can and very often do occur. I apologize for any typographical errors that were not detected and corrected.     Earleen Newport, MD 06/13/19 1051

## 2019-06-14 ENCOUNTER — Observation Stay: Payer: Medicaid Other

## 2019-06-14 DIAGNOSIS — F329 Major depressive disorder, single episode, unspecified: Secondary | ICD-10-CM | POA: Diagnosis present

## 2019-06-14 DIAGNOSIS — E1169 Type 2 diabetes mellitus with other specified complication: Secondary | ICD-10-CM

## 2019-06-14 DIAGNOSIS — E785 Hyperlipidemia, unspecified: Secondary | ICD-10-CM | POA: Diagnosis present

## 2019-06-14 DIAGNOSIS — Z87440 Personal history of urinary (tract) infections: Secondary | ICD-10-CM | POA: Diagnosis not present

## 2019-06-14 DIAGNOSIS — N179 Acute kidney failure, unspecified: Secondary | ICD-10-CM

## 2019-06-14 DIAGNOSIS — Z880 Allergy status to penicillin: Secondary | ICD-10-CM | POA: Diagnosis not present

## 2019-06-14 DIAGNOSIS — R140 Abdominal distension (gaseous): Secondary | ICD-10-CM | POA: Diagnosis present

## 2019-06-14 DIAGNOSIS — K861 Other chronic pancreatitis: Secondary | ICD-10-CM | POA: Diagnosis present

## 2019-06-14 DIAGNOSIS — E1165 Type 2 diabetes mellitus with hyperglycemia: Secondary | ICD-10-CM | POA: Diagnosis present

## 2019-06-14 DIAGNOSIS — Q453 Other congenital malformations of pancreas and pancreatic duct: Secondary | ICD-10-CM | POA: Diagnosis not present

## 2019-06-14 DIAGNOSIS — K8681 Exocrine pancreatic insufficiency: Secondary | ICD-10-CM | POA: Diagnosis present

## 2019-06-14 DIAGNOSIS — I10 Essential (primary) hypertension: Secondary | ICD-10-CM | POA: Diagnosis not present

## 2019-06-14 DIAGNOSIS — E1122 Type 2 diabetes mellitus with diabetic chronic kidney disease: Secondary | ICD-10-CM | POA: Diagnosis present

## 2019-06-14 DIAGNOSIS — E7800 Pure hypercholesterolemia, unspecified: Secondary | ICD-10-CM | POA: Diagnosis present

## 2019-06-14 DIAGNOSIS — K859 Acute pancreatitis without necrosis or infection, unspecified: Secondary | ICD-10-CM | POA: Diagnosis present

## 2019-06-14 DIAGNOSIS — Z9049 Acquired absence of other specified parts of digestive tract: Secondary | ICD-10-CM | POA: Diagnosis not present

## 2019-06-14 DIAGNOSIS — Z888 Allergy status to other drugs, medicaments and biological substances status: Secondary | ICD-10-CM | POA: Diagnosis not present

## 2019-06-14 DIAGNOSIS — R21 Rash and other nonspecific skin eruption: Secondary | ICD-10-CM | POA: Diagnosis present

## 2019-06-14 DIAGNOSIS — J45909 Unspecified asthma, uncomplicated: Secondary | ICD-10-CM | POA: Diagnosis present

## 2019-06-14 DIAGNOSIS — F1011 Alcohol abuse, in remission: Secondary | ICD-10-CM | POA: Diagnosis present

## 2019-06-14 DIAGNOSIS — N1831 Chronic kidney disease, stage 3a: Secondary | ICD-10-CM | POA: Diagnosis present

## 2019-06-14 DIAGNOSIS — R1084 Generalized abdominal pain: Secondary | ICD-10-CM | POA: Diagnosis present

## 2019-06-14 DIAGNOSIS — E1142 Type 2 diabetes mellitus with diabetic polyneuropathy: Secondary | ICD-10-CM

## 2019-06-14 DIAGNOSIS — I129 Hypertensive chronic kidney disease with stage 1 through stage 4 chronic kidney disease, or unspecified chronic kidney disease: Secondary | ICD-10-CM | POA: Diagnosis present

## 2019-06-14 DIAGNOSIS — R0981 Nasal congestion: Secondary | ICD-10-CM | POA: Diagnosis present

## 2019-06-14 DIAGNOSIS — Z20822 Contact with and (suspected) exposure to covid-19: Secondary | ICD-10-CM | POA: Diagnosis present

## 2019-06-14 LAB — GLUCOSE, CAPILLARY
Glucose-Capillary: 101 mg/dL — ABNORMAL HIGH (ref 70–99)
Glucose-Capillary: 187 mg/dL — ABNORMAL HIGH (ref 70–99)
Glucose-Capillary: 243 mg/dL — ABNORMAL HIGH (ref 70–99)
Glucose-Capillary: 287 mg/dL — ABNORMAL HIGH (ref 70–99)
Glucose-Capillary: 70 mg/dL (ref 70–99)
Glucose-Capillary: 84 mg/dL (ref 70–99)

## 2019-06-14 LAB — CBC
HCT: 33.7 % — ABNORMAL LOW (ref 36.0–46.0)
Hemoglobin: 11.3 g/dL — ABNORMAL LOW (ref 12.0–15.0)
MCH: 28.2 pg (ref 26.0–34.0)
MCHC: 33.5 g/dL (ref 30.0–36.0)
MCV: 84 fL (ref 80.0–100.0)
Platelets: 218 10*3/uL (ref 150–400)
RBC: 4.01 MIL/uL (ref 3.87–5.11)
RDW: 12.1 % (ref 11.5–15.5)
WBC: 8.8 10*3/uL (ref 4.0–10.5)
nRBC: 0 % (ref 0.0–0.2)

## 2019-06-14 LAB — BASIC METABOLIC PANEL
Anion gap: 4 — ABNORMAL LOW (ref 5–15)
BUN: 24 mg/dL — ABNORMAL HIGH (ref 8–23)
CO2: 28 mmol/L (ref 22–32)
Calcium: 8.1 mg/dL — ABNORMAL LOW (ref 8.9–10.3)
Chloride: 111 mmol/L (ref 98–111)
Creatinine, Ser: 1.17 mg/dL — ABNORMAL HIGH (ref 0.44–1.00)
GFR calc Af Amer: 58 mL/min — ABNORMAL LOW (ref >60)
GFR calc non Af Amer: 50 mL/min — ABNORMAL LOW (ref >60)
Glucose, Bld: 97 mg/dL (ref 70–99)
Potassium: 4 mmol/L (ref 3.5–5.1)
Sodium: 143 mmol/L (ref 135–145)

## 2019-06-14 LAB — HEMOGLOBIN A1C
Hgb A1c MFr Bld: 10.7 % — ABNORMAL HIGH (ref 4.8–5.6)
Mean Plasma Glucose: 260.39 mg/dL

## 2019-06-14 MED ORDER — FLUTICASONE PROPIONATE 50 MCG/ACT NA SUSP
2.0000 | Freq: Every day | NASAL | Status: DC
  Administered 2019-06-14 – 2019-06-17 (×4): 2 via NASAL
  Filled 2019-06-14: qty 16

## 2019-06-14 MED ORDER — ENOXAPARIN SODIUM 40 MG/0.4ML ~~LOC~~ SOLN
40.0000 mg | SUBCUTANEOUS | Status: DC
  Administered 2019-06-14 – 2019-06-17 (×4): 40 mg via SUBCUTANEOUS
  Filled 2019-06-14 (×4): qty 0.4

## 2019-06-14 NOTE — Progress Notes (Signed)
Patient ID: Karen [name], female   DOB: [birth year], 64 y.o.   MRN: [id number] [facility] PROGRESS NOTE  WAVER DIBIASIO YTK:[id number] DOB: [birth year] DOA: 06/13/2019 PCP: Lavera Guise, MD  HPI/Subjective: The patient complains of abdominal pain up to an 8 out of 10 in intensity.  She has nausea and back pain.  She states her hands have been tingling.  She states her nose has been congested.  Objective: Vitals:   06/14/19 1227 06/14/19 1531  BP: 134/68 129/68  Pulse: 69 70  Resp: 18 16  Temp: 98.4 F (36.9 C) 98 F (36.7 C)  SpO2: 93% 94%    Intake/Output Summary (Last 24 hours) at 06/14/2019 1740 Last data filed at 06/14/2019 1000 Gross per 24 hour  Intake 1599.68 ml  Output --  Net 1599.68 ml   Filed Weights   06/13/19 0724  Weight: 56.6 kg    ROS: Review of Systems  Constitutional: Negative for fever.  HENT: Positive for congestion.   Eyes: Negative for blurred vision.  Respiratory: Negative for cough and shortness of breath.   Cardiovascular: Negative for chest pain.  Gastrointestinal: Positive for abdominal pain and nausea. Negative for constipation, diarrhea and vomiting.  Genitourinary: Negative for dysuria.  Musculoskeletal: Positive for back pain and joint pain.  Neurological: Positive for sensory change.   Exam: Physical Exam  Constitutional: She is oriented to person, place, and time.  HENT:  Nose: No mucosal edema.  Mouth/Throat: No oropharyngeal exudate or posterior oropharyngeal edema.  Eyes: Conjunctivae and lids are normal.  Neck: Carotid bruit is not present.  Cardiovascular: S1 normal and S2 normal. Exam reveals no gallop.  No murmur heard. Respiratory: No respiratory distress. She has decreased breath sounds in the right lower field and the left lower field. She has no wheezes. She has no rhonchi. She has no rales.  GI: Soft. Bowel sounds are normal. She exhibits distension. There is abdominal tenderness.  Musculoskeletal:     Right ankle: No  swelling.     Left ankle: No swelling.  Lymphadenopathy:    She has no cervical adenopathy.  Neurological: She is alert and oriented to person, place, and time. No cranial nerve deficit.  Skin: Skin is warm. No rash noted. Nails show no clubbing.  Psychiatric: She has a normal mood and affect.      Data Reviewed: Basic Metabolic Panel: Recent Labs  Lab 06/13/19 0729 06/14/19 0535  NA 135 143  K 4.9 4.0  CL 101 111  CO2 24 28  GLUCOSE 428* 97  BUN 39* 24*  CREATININE 1.42* 1.17*  CALCIUM 8.9 8.1*   Liver Function Tests: Recent Labs  Lab 06/13/19 0729  AST 28  ALT 40  ALKPHOS 307*  BILITOT 0.6  PROT 7.5  ALBUMIN 3.8   Recent Labs  Lab 06/13/19 0729  LIPASE 115*   No results for input(s): AMMONIA in the last 168 hours. CBC: Recent Labs  Lab 06/13/19 0729 06/14/19 0535  WBC 14.1* 8.8  NEUTROABS 10.2*  --   HGB 13.1 11.3*  HCT 38.6 33.7*  MCV 84.6 84.0  PLT 230 218   Cardiac Enzymes: No results for input(s): CKTOTAL, CKMB, CKMBINDEX, TROPONINI in the last 168 hours. BNP (last 3 results) Recent Labs    06/13/19 0729  BNP 33.4    ProBNP (last 3 results) No results for input(s): PROBNP in the last 8760 hours.  CBG: Recent Labs  Lab 06/13/19 2346 06/14/19 0442 06/14/19 0755 06/14/19 1226 06/14/19 1529  GLUCAP 108* 84 101* 70 187*    Recent Results (from the past 240 hour(s))  SARS Coronavirus 2 by RT PCR (hospital order, performed in [facility] hospital lab) Nasopharyngeal Nasopharyngeal Swab     Status: None   Collection Time: 06/13/19 11:41 AM   Specimen: Nasopharyngeal Swab  Result Value Ref Range Status   SARS Coronavirus 2 NEGATIVE NEGATIVE Final    Comment: (NOTE) SARS-CoV-2 target nucleic acids are NOT DETECTED. The SARS-CoV-2 RNA is generally detectable in upper and lower respiratory specimens during the acute phase of infection. The lowest concentration of SARS-CoV-2 viral copies this assay can detect is 250 copies / mL. A  negative result does not preclude SARS-CoV-2 infection and should not be used as the sole basis for treatment or other patient management decisions.  A negative result may occur with improper specimen collection / handling, submission of specimen other than nasopharyngeal swab, presence of viral mutation(s) within the areas targeted by this assay, and inadequate number of viral copies (<250 copies / mL). A negative result must be combined with clinical observations, patient history, and epidemiological information. Fact Sheet for Patients:   StrictlyIdeas.no Fact Sheet for Healthcare Providers: BankingDealers.co.za This test is not yet approved or cleared  by the [place] FDA and has been authorized for detection and/or diagnosis of SARS-CoV-2 by FDA under an Emergency Use Authorization (EUA).  This EUA will remain in effect (meaning this test can be used) for the duration of the COVID-19 declaration under Section 564(b)(1) of the Act, 21 U.S.C. section 360bbb-3(b)(1), unless the authorization is terminated or revoked sooner. Performed at [facility], [address], [place], [place] [address]      Studies: CT ABDOMEN PELVIS W CONTRAST  Result Date: 06/13/2019 CLINICAL DATA:  Abdominal pain starting this morning. Distension. Alcohol use, quitting 5 years ago. Elevated white blood cell count and lipase. EXAM: CT ABDOMEN AND PELVIS WITH CONTRAST TECHNIQUE: Multidetector CT imaging of the abdomen and pelvis was performed using the standard protocol following bolus administration of intravenous contrast. CONTRAST:  27mL OMNIPAQUE IOHEXOL 300 MG/ML  SOLN COMPARISON:  Right upper quadrant ultrasound 03/15/2019. Most recent CT of 01/24/2019. FINDINGS: Lower chest: Bibasilar scarring. Normal heart size without pericardial or pleural effusion. Hepatobiliary: Caudate lobe prominence. Subtle irregular hepatic capsule. A right hepatic lobe 8  mm hypoattenuating lesion is similar to 01/24/2019, nonspecific. Normal gallbladder. The common duct measures 9 mm on 28/2 versus 6 mm on 01/24/2019 (when remeasured). No calcified common duct stone. Pancreas: Suspicion of pancreas divisum, with a prominent dorsal duct entering the duodenum on 31/2. No duct dilatation or acute inflammation. Spleen: Normal in size, without focal abnormality. Adrenals/Urinary Tract: Normal adrenal glands. Punctate upper pole right renal collecting system calculus. Normal left kidney. No hydronephrosis. Normal urinary bladder. Stomach/Bowel: Normal stomach, without wall thickening. Colonic stool burden suggests constipation. Scattered colonic diverticula. Normal terminal ileum. Appendectomy. Normal small bowel. Vascular/Lymphatic: Aortic and branch vessel atherosclerosis. Patent portal, splenic, hepatic veins. No abdominopelvic adenopathy. Reproductive: Hysterectomy.  No adnexal mass. Other: No significant free fluid.  Mild pelvic floor laxity. Musculoskeletal: Lipoma within the right illiopsoas tendon of 1.7 cm. IMPRESSION: 1. Possible constipation. No other explanation for patient's symptoms. 2. Subtle irregular hepatic capsule and caudate lobe prominence, for which mild cirrhosis cannot be excluded. Correlate with risk factors. Right hepatic lobe 8 mm hypoattenuating lesion is similar to on the prior exam and present back to 01/29/2017, favoring a benign etiology. 3. Right nephrolithiasis. 4. Aortic Atherosclerosis (ICD10-I70.0). 5.  Suspicion of pancreas divisum. No CT evidence of acute pancreatitis. Electronically Signed   By: Abigail Miyamoto M.D.   On: 06/13/2019 10:39   US Abdomen Limited  Result Date: 06/14/2019 CLINICAL DATA:  64 year old with abdominal distension. Evaluate for ascites. EXAM: LIMITED ABDOMEN ULTRASOUND FOR ASCITES TECHNIQUE: Limited ultrasound survey for ascites was performed in all four abdominal quadrants. COMPARISON:  CT abdomen and pelvis 06/13/2019  FINDINGS: No ascites in the right upper quadrant, right lower quadrant, left upper quadrant or left lower quadrant. IMPRESSION: Negative for ascites. Electronically Signed   By: Markus Daft M.D.   On: 06/14/2019 12:13    Scheduled Meds: . amLODipine  10 mg Oral Daily  . docusate sodium  100 mg Oral BID  . enoxaparin (LOVENOX) injection  40 mg Subcutaneous Q24H  . fluticasone  2 spray Each Nare Daily  . insulin aspart  0-9 Units Subcutaneous Q4H  . insulin glargine  20 Units Subcutaneous Daily  . lipase/protease/amylase  72,000 Units Oral TID WC  . pantoprazole  40 mg Oral Daily  . polyethylene glycol  17 g Oral Daily  . pregabalin  200 mg Oral BID  . QUEtiapine  25 mg Oral QHS  . rosuvastatin  20 mg Oral q1800   Continuous Infusions: . sodium chloride 75 mL/hr at 06/14/19 1233    Assessment/Plan:  1. Acute on chronic pancreatitis.  The last time I had her in the hospital it took a while to improve and it was back in January.  She has had other admissions since then.  On recent imaging it shows pancreatic divisum which could be an independent risk factor for pancreatitis.  Her lipase is not elevated but still having a lot of pain.  Will start clear liquid diet today.  Continue IV fluids. 2. Type 2 diabetes mellitus with hyperlipidemia.  Continue glargine insulin.  Continue Crestor.  Will add on a hemoglobin A1c. 3. Diabetic neuropathy on Lyrica 4. Essential hypertension on amlodipine 5. Nasal congestion start Flonase 6. Abdominal distention.  Ultrasound did not show any ascites. 7. Acute kidney injury.  Continue IV fluid hydration.     Code Status:     Code Status Orders  (From admission, onward)         Start     Ordered   06/13/19 1306  Full code  Continuous     06/13/19 1306        Code Status History    Date Active Date Inactive Code Status Order ID Comments User Context   03/15/2019 1459 03/16/2019 1851 Full Code [id number]  Ivor Costa, MD ED   02/26/2019 1529  03/03/2019 1926 Full Code [id number]  Para Skeans, MD ED   01/30/2019 0218 01/31/2019 1721 Full Code [id number]  Athena Masse, MD ED   01/24/2019 1612 01/29/2019 1831 Full Code [id number]  Ivor Costa, MD ED   09/25/2018 1442 09/29/2018 2252 Full Code [id number]  Demetrios Loll, MD ED   11/11/2017 1417 11/13/2017 1948 Full Code [id number]  Hillary Bow, MD ED   04/25/2017 1039 04/26/2017 1415 Full Code [id number]  Schuyler Amor, MD ED   01/03/2017 1730 01/07/2017 1531 Full Code [id number]  Idelle Crouch, MD Inpatient   12/10/2016 1201 12/12/2016 1712 Full Code [id number]  Demetrios Loll, MD Inpatient   10/15/2016 2350 10/17/2016 0056 Full Code [id number]  Dustin Flock, MD Inpatient   04/21/2016 0505 04/24/2016 2031 Full Code [id number]  Saundra Shelling, MD Inpatient   12/31/2015 0739 01/01/2016 2008 Full Code [id number]  Saundra Shelling, MD Inpatient   12/10/2015 1109 12/12/2015 1848 Full Code [id number]  Demetrios Loll, MD Inpatient   10/29/2015 1545 11/01/2015 1623 Full Code [id number]  Theodoro Grist, MD Inpatient   02/17/2015 1704 02/19/2015 1804 Full Code [id number]  Nicholes Mango, MD Inpatient   Advance Care Planning Activity      Disposition Plan: Status is: Inpatient  Dispo: The patient is from: Home              Anticipated d/c is to: Home              Anticipated d/c date is: Dependent on how she is feeling with regards to her pain and regards to whether I can advance diet.  The last time I had the patient in the hospital it took quite a bit of time for her to improve for disposition              Patient currently being treated for acute on chronic pancreatitis with IV fluid hydration and pain medication.  Time spent: 28 minutes  [place]

## 2019-06-15 DIAGNOSIS — L299 Pruritus, unspecified: Secondary | ICD-10-CM

## 2019-06-15 LAB — GLUCOSE, CAPILLARY
Glucose-Capillary: 67 mg/dL — ABNORMAL LOW (ref 70–99)
Glucose-Capillary: 95 mg/dL (ref 70–99)
Glucose-Capillary: 117 mg/dL — ABNORMAL HIGH (ref 70–99)
Glucose-Capillary: 130 mg/dL — ABNORMAL HIGH (ref 70–99)
Glucose-Capillary: 180 mg/dL — ABNORMAL HIGH (ref 70–99)
Glucose-Capillary: 145 mg/dL — ABNORMAL HIGH (ref 70–99)

## 2019-06-15 MED ORDER — INSULIN ASPART 100 UNIT/ML ~~LOC~~ SOLN
3.0000 [IU] | Freq: Three times a day (TID) | SUBCUTANEOUS | Status: DC
  Administered 2019-06-15 – 2019-06-16 (×2): 3 [IU] via SUBCUTANEOUS
  Filled 2019-06-15 (×2): qty 1

## 2019-06-15 MED ORDER — HYDROCODONE-ACETAMINOPHEN 5-325 MG PO TABS
2.0000 | ORAL_TABLET | Freq: Four times a day (QID) | ORAL | Status: DC | PRN
  Administered 2019-06-15 – 2019-06-18 (×7): 2 via ORAL
  Filled 2019-06-15 (×8): qty 2

## 2019-06-15 MED ORDER — INSULIN ASPART 100 UNIT/ML ~~LOC~~ SOLN
0.0000 [IU] | Freq: Three times a day (TID) | SUBCUTANEOUS | Status: DC
  Administered 2019-06-15: 2 [IU] via SUBCUTANEOUS
  Administered 2019-06-16: 1 [IU] via SUBCUTANEOUS
  Administered 2019-06-16: 5 [IU] via SUBCUTANEOUS
  Administered 2019-06-17 (×2): 3 [IU] via SUBCUTANEOUS
  Administered 2019-06-17: 1 [IU] via SUBCUTANEOUS
  Filled 2019-06-15 (×6): qty 1

## 2019-06-15 MED ORDER — TRIAMCINOLONE ACETONIDE 0.5 % EX CREA
TOPICAL_CREAM | Freq: Two times a day (BID) | CUTANEOUS | Status: DC
  Administered 2019-06-16: 1 via TOPICAL
  Filled 2019-06-15: qty 15

## 2019-06-15 MED ORDER — SIMETHICONE 80 MG PO CHEW
80.0000 mg | CHEWABLE_TABLET | Freq: Four times a day (QID) | ORAL | Status: DC
  Administered 2019-06-15 – 2019-06-18 (×12): 80 mg via ORAL
  Filled 2019-06-15 (×12): qty 1

## 2019-06-15 MED ORDER — INSULIN ASPART 100 UNIT/ML ~~LOC~~ SOLN: 0.0000 [IU] | Freq: Every day | SUBCUTANEOUS | Status: DC

## 2019-06-15 NOTE — Progress Notes (Signed)
Patient ID: Karen [name], female   DOB: July 19, 1955, 64 y.o.   MRN: [id number] [facility] PROGRESS NOTE  Karen [name] KYH:[id number] DOB: 1955/03/16 DOA: 06/13/2019 PCP: Lavera Guise, MD  HPI/Subjective: Patient states that she has abdominal pain 7-8 out of 10 intensity.  Some nausea.  Interested in going up to full liquid diet.  She states she has a rash on her posterior legs.  Complains of abdominal bloating.  Objective: Vitals:   06/15/19 1213 06/15/19 1217  BP: 130/67 131/72  Pulse: 76 79  Resp: 18 18  Temp: 98.8 F (37.1 C) 97.9 F (36.6 C)  SpO2: 93% 99%    Intake/Output Summary (Last 24 hours) at 06/15/2019 1504 Last data filed at 06/15/2019 1416 Gross per 24 hour  Intake 1981.8 ml  Output 950 ml  Net 1031.8 ml   Filed Weights   06/13/19 0724  Weight: 56.6 kg    ROS: Review of Systems  Constitutional: Negative for fever.  Eyes: Negative for blurred vision.  Respiratory: Negative for cough and shortness of breath.   Cardiovascular: Negative for chest pain.  Gastrointestinal: Positive for abdominal pain and nausea. Negative for constipation, diarrhea and vomiting.  Genitourinary: Negative for dysuria.  Musculoskeletal: Positive for back pain and joint pain.   Exam: Physical Exam  Constitutional: She is oriented to person, place, and time.  HENT:  Nose: No mucosal edema.  Mouth/Throat: No oropharyngeal exudate.  Eyes: Conjunctivae and lids are normal.  Neck: Carotid bruit is not present.  Cardiovascular: S1 normal and S2 normal. Exam reveals no gallop.  No murmur heard. Respiratory: No respiratory distress. She has decreased breath sounds in the right lower field and the left lower field. She has no wheezes. She has no rhonchi. She has no rales.  GI: Soft. Bowel sounds are normal. She exhibits distension. There is abdominal tenderness.  Musculoskeletal:     Right ankle: No swelling.     Left ankle: No swelling.  Lymphadenopathy:    She has no cervical  adenopathy.  Neurological: She is alert and oriented to person, place, and time. No cranial nerve deficit.  Skin: Skin is warm. No rash noted. Nails show no clubbing.      Data Reviewed: Basic Metabolic Panel: Recent Labs  Lab 06/13/19 0729 06/14/19 0535  NA 135 143  K 4.9 4.0  CL 101 111  CO2 24 28  GLUCOSE 428* 97  BUN 39* 24*  CREATININE 1.42* 1.17*  CALCIUM 8.9 8.1*   Liver Function Tests: Recent Labs  Lab 06/13/19 0729  AST 28  ALT 40  ALKPHOS 307*  BILITOT 0.6  PROT 7.5  ALBUMIN 3.8   Recent Labs  Lab 06/13/19 0729  LIPASE 115*   No results for input(s): AMMONIA in the last 168 hours. CBC: Recent Labs  Lab 06/13/19 0729 06/14/19 0535  WBC 14.1* 8.8  NEUTROABS 10.2*  --   HGB 13.1 11.3*  HCT 38.6 33.7*  MCV 84.6 84.0  PLT 230 218   BNP (last 3 results) Recent Labs    06/13/19 0729  BNP 33.4    CBG: Recent Labs  Lab 06/14/19 2351 06/15/19 0452 06/15/19 0524 06/15/19 0742 06/15/19 1213  GLUCAP 287* 67* 95 117* 130*    Recent Results (from the past 240 hour(s))  SARS Coronavirus 2 by RT PCR (hospital order, performed in [facility] hospital lab) Nasopharyngeal Nasopharyngeal Swab     Status: None   Collection Time: 06/13/19 11:41 AM   Specimen: Nasopharyngeal Swab  Result Value Ref Range Status   SARS Coronavirus 2 NEGATIVE NEGATIVE Final    Comment: (NOTE) SARS-CoV-2 target nucleic acids are NOT DETECTED. The SARS-CoV-2 RNA is generally detectable in upper and lower respiratory specimens during the acute phase of infection. The lowest concentration of SARS-CoV-2 viral copies this assay can detect is 250 copies / mL. A negative result does not preclude SARS-CoV-2 infection and should not be used as the sole basis for treatment or other patient management decisions.  A negative result may occur with improper specimen collection / handling, submission of specimen other than nasopharyngeal swab, presence of viral mutation(s) within  the areas targeted by this assay, and inadequate number of viral copies (<250 copies / mL). A negative result must be combined with clinical observations, patient history, and epidemiological information. Fact Sheet for Patients:   StrictlyIdeas.no Fact Sheet for Healthcare Providers: BankingDealers.co.za This test is not yet approved or cleared  by the [place] FDA and has been authorized for detection and/or diagnosis of SARS-CoV-2 by FDA under an Emergency Use Authorization (EUA).  This EUA will remain in effect (meaning this test can be used) for the duration of the COVID-19 declaration under Section 564(b)(1) of the Act, 21 U.S.C. section 360bbb-3(b)(1), unless the authorization is terminated or revoked sooner. Performed at [facility], [address] [place], [place] [address]      Studies: US Abdomen Limited  Result Date: 06/14/2019 CLINICAL DATA:  64 year old with abdominal distension. Evaluate for ascites. EXAM: LIMITED ABDOMEN ULTRASOUND FOR ASCITES TECHNIQUE: Limited ultrasound survey for ascites was performed in all four abdominal quadrants. COMPARISON:  CT abdomen and pelvis 06/13/2019 FINDINGS: No ascites in the right upper quadrant, right lower quadrant, left upper quadrant or left lower quadrant. IMPRESSION: Negative for ascites. Electronically Signed   By: Markus Daft M.D.   On: 06/14/2019 12:13    Scheduled Meds: . amLODipine  10 mg Oral Daily  . docusate sodium  100 mg Oral BID  . enoxaparin (LOVENOX) injection  40 mg Subcutaneous Q24H  . fluticasone  2 spray Each Nare Daily  . insulin aspart  0-5 Units Subcutaneous QHS  . insulin aspart  0-9 Units Subcutaneous TID WC  . insulin aspart  3 Units Subcutaneous TID WC  . insulin glargine  20 Units Subcutaneous Daily  . lipase/protease/amylase  72,000 Units Oral TID WC  . pantoprazole  40 mg Oral Daily  . polyethylene glycol  17 g Oral Daily  . pregabalin   200 mg Oral BID  . QUEtiapine  25 mg Oral QHS  . rosuvastatin  20 mg Oral q1800  . simethicone  80 mg Oral QID  . triamcinolone cream   Topical BID   Continuous Infusions: . sodium chloride 75 mL/hr at 06/15/19 0000    Assessment/Plan:  1. Acute on chronic pancreatitis.  The last time I had her in the hospital it took a while to improve and it was back in January.  She has had other admissions since then.  On recent imaging it shows pancreatic divisum which could be an independent risk factor for pancreatitis.  Her lipase is not elevated but still having a lot of pain.  Continue IV fluids.  Advance to full liquid diet today. 2. Type 2 diabetes mellitus with hyperlipidemia.  Continue glargine insulin.  Continue Crestor.  Hemoglobin A1c still labile today today 10.7 but better than it has been. 3. Diabetic neuropathy on Lyrica 4. Essential hypertension on amlodipine 5. Nasal congestion-continue Flonase 6. Abdominal  distention.  Ultrasound did not show any ascites. 7. Acute kidney injury.  Continue IV fluid hydration.  Creatinine improved yesterday down to 1.17.  Recheck again tomorrow. 8. Leg itching will give triamcinolone cream.     Code Status:     Code Status Orders  (From admission, onward)         Start     Ordered   06/13/19 1306  Full code  Continuous     06/13/19 1306        Code Status History    Date Active Date Inactive Code Status Order ID Comments User Context   03/15/2019 1459 03/16/2019 1851 Full Code [id number]  Ivor Costa, MD ED   02/26/2019 1529 03/03/2019 1926 Full Code [id number]  Para Skeans, MD ED   01/30/2019 0218 01/31/2019 1721 Full Code [id number]  Athena Masse, MD ED   01/24/2019 1612 01/29/2019 1831 Full Code [id number]  Ivor Costa, MD ED   09/25/2018 1442 09/29/2018 2252 Full Code [id number]  Demetrios Loll, MD ED   11/11/2017 1417 11/13/2017 1948 Full Code [id number]  Hillary Bow, MD ED   04/25/2017 1039 04/26/2017 1415 Full Code [id number]  Schuyler Amor,  MD ED   01/03/2017 1730 01/07/2017 1531 Full Code [id number]  Idelle Crouch, MD Inpatient   12/10/2016 1201 12/12/2016 1712 Full Code [id number]  Demetrios Loll, MD Inpatient   10/15/2016 2350 10/17/2016 0056 Full Code [id number]  Dustin Flock, MD Inpatient   04/21/2016 0505 04/24/2016 2031 Full Code [id number]  Saundra Shelling, MD Inpatient   12/31/2015 0739 01/01/2016 2008 Full Code [id number]  Saundra Shelling, MD Inpatient   12/10/2015 1109 12/12/2015 1848 Full Code [id number]  Demetrios Loll, MD Inpatient   10/29/2015 1545 11/01/2015 1623 Full Code [id number]  Theodoro Grist, MD Inpatient   02/17/2015 1704 02/19/2015 1804 Full Code [id number]  Nicholes Mango, MD Inpatient   Advance Care Planning Activity      Disposition Plan: Status is: Inpatient  Dispo: The patient is from: Home              Anticipated d/c is to: Home              Anticipated d/c date is: Dependent on how she is feeling with regards to her pain and regards to how quickly I can advance diet.  The last time I had the patient in the hospital it took quite a bit of time for her to improve for disposition              Patient currently being treated for acute on chronic pancreatitis with IV fluid hydration and pain medication.  Advance to full liquid diet today.  Time spent: 28 minutes  [place]

## 2019-06-15 NOTE — Progress Notes (Signed)
Inpatient Diabetes Program Recommendations  AACE/ADA: New Consensus Statement on Inpatient Glycemic Control   Target Ranges:  Prepandial:   less than 140 mg/dL      Peak postprandial:   less than 180 mg/dL (1-2 hours)      Critically ill patients:  140 - 180 mg/dL   Results for Karen [name], Karen [name] (MRN [id number]) as of 06/15/2019 08:06  Ref. Range 06/14/2019 07:55 06/14/2019 9:15 06/14/2019 12:26 06/14/2019 15:29 06/14/2019 19:30 06/14/2019 23:51 06/15/2019 04:52 06/15/2019 05:24 06/15/2019 07:42  Glucose-Capillary Latest Ref Range: 70 - 99 mg/dL 101 (H)     Lantus 20 units 70 187 (H)  Novolog 2 units 243 (H)  Novolog 2 units 287 (H)  Novolog 5 units 67 (L) 95 117 (H)   Review of Glycemic Control  Diabetes history: DM2 Outpatient Diabetes medications: Lantus 25 units daily, Novolog 4 units TID with meals Current orders for Inpatient glycemic control: Lantus 20 units daily, Novolog 0-9 units Q4H  Inpatient Diabetes Program Recommendations:    Insulin-Noted glucose down to 67 mg/dl at 4:52 am today. Anticipate hypoglycemia due to Novolog correction given at 00:06 today. Once patient is eating well, please consider changing CBGs and SSI to Novolog 0-9 units TID with meals, Novolog 0-5 units QHS, and adding Novolog 3 units TID with meals for meal coverage if patient eats at least 50% of meals.  Thanks, Barnie Alderman, RN, MSN, CDE Diabetes Coordinator Inpatient Diabetes Program [phone] (Team Pager from 8am to 5pm)

## 2019-06-15 NOTE — TOC Initial Note (Signed)
Transition of Care [facility]) - Initial/Assessment Note    Patient Details  Name: Karen [name] MRN: [id number] Date of Birth: 1955/11/25  Transition of Care [facility]) CM/SW Contact:    Beverly Sessions, RN Phone Number: 06/15/2019, 2:27 PM  Clinical Narrative:                 Patient admitted from home with abdominal pain.  Patient lives at home with boyfriend  PCP Humphrey Rolls-  States that she uses medicaid transportation, and her boyfriend is also available to provide transportation if needed  Pharmacy - [facility] - states she is able to obtain all of her medications  Has RW and cane in the home  Patient states that she has Medicaid PCS services 7 days a week, 2.5 hours a day.  States that she is working with DSS to get her number of hours increased   No TOC needs identified for discharge.  Please consult if indicated   Expected Discharge Plan: Home/Self Care Barriers to Discharge: Continued Medical Work up   Patient Goals and CMS Choice        Expected Discharge Plan and Services Expected Discharge Plan: Home/Self Care       Living arrangements for the past 2 months: Single Family Home                                      Prior Living Arrangements/Services Living arrangements for the past 2 months: Single Family Home Lives with:: Significant Other Patient language and need for interpreter reviewed:: Yes        Need for Family Participation in Patient Care: Yes (Comment) Care giver support system in place?: Yes (comment) Current home services: DME Criminal Activity/Legal Involvement Pertinent to Current Situation/Hospitalization: No - Comment as needed  Activities of Daily Living Home Assistive Devices/Equipment: Cane (specify quad or straight), Walker (specify type) ADL Screening (condition at time of admission) Patient's cognitive ability adequate to safely complete daily activities?: Yes Is the patient deaf or have difficulty hearing?: No Does the patient  have difficulty seeing, even when wearing glasses/contacts?: No Does the patient have difficulty concentrating, remembering, or making decisions?: No Patient able to express need for assistance with ADLs?: Yes Does the patient have difficulty dressing or bathing?: No Independently performs ADLs?: Yes (appropriate for developmental age) Does the patient have difficulty walking or climbing stairs?: No Weakness of Legs: None Weakness of Arms/Hands: None  Permission Sought/Granted                  Emotional Assessment Appearance:: Appears stated age     Orientation: : Oriented to Self, Oriented to Place, Oriented to  Time   Psych Involvement: No (comment)  Admission diagnosis:  Generalized abdominal pain [R10.84] Hyperglycemia [R73.9] Abdominal pain [R10.9] Acute on chronic pancreatitis ([place]) [K85.90, K86.1] Chronic pancreatitis ([place]) [K86.1] Patient Active Problem List   Diagnosis Date Noted  . Diabetic polyneuropathy associated with type 2 diabetes mellitus ([place])   . Leukocytosis 06/13/2019  . Chronic kidney disease (CKD), stage II (mild)   . CKD (chronic kidney disease), stage IIIa 03/15/2019  . HTN (hypertension) 03/15/2019  . Type II diabetes mellitus with renal manifestations ([place]) 03/15/2019  . Headache 02/26/2019  . Dizziness 02/26/2019  . Hyperglycemia due to type 2 diabetes mellitus ([place]) 01/30/2019  . Acute pancreatitis 01/30/2019  . Constipation   . Acute on chronic pancreatitis ([place]) 01/25/2019  .  Acute pain of left shoulder   . Adhesive capsulitis of left shoulder   . Abnormal LFTs 01/24/2019  . Cough 01/24/2019  . Hypertension   . HLD (hyperlipidemia)   . Type 2 diabetes mellitus with hyperlipidemia ([place])   . GERD (gastroesophageal reflux disease)   . AKI (acute kidney injury) ([place])   . Pancreatitis, recurrent   . Chronic pancreatitis ([place]) 09/25/2018  . 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 Coffee ground emesis   . Upper GI bleed 11/11/2017  .  Pancreatitis, acute 01/03/2017  . ETOH abuse 01/03/2017  . UTI (urinary tract infection) 01/03/2017  . Nausea and vomiting   . Esophageal candidiasis ([place])   . Intractable vomiting with nausea   . Hypertensive urgency 04/21/2016  . Abdominal pain   . Nausea vomiting and diarrhea   . Smoker   . Poorly controlled type 2 diabetes mellitus ([place])   . Acute renal insufficiency 10/29/2015  . Elevated troponin 10/29/2015  . Urinary tract infection without hematuria 10/29/2015  . Left flank pain 10/29/2015  . Malignant essential hypertension 10/29/2015  . DKA (diabetic ketoacidoses) ([place]) 02/17/2015  . Emphysematous cystitis    PCP:  Lavera Guise, MD Pharmacy:   [place], [place] [address] Phone: [phone] Fax: [phone]     Social Determinants of Health (SDOH) Interventions    Readmission Risk Interventions Readmission Risk Prevention Plan 06/15/2019  Palliative Care Screening Not Applicable  Medication Review (RN Care Manager) Complete  Some recent data might be hidden

## 2019-06-16 LAB — CBC WITH DIFFERENTIAL/PLATELET
Abs Immature Granulocytes: 0.05 10*3/uL (ref 0.00–0.07)
Basophils Absolute: 0 10*3/uL (ref 0.0–0.1)
Basophils Relative: 1 %
Eosinophils Absolute: 0.3 10*3/uL (ref 0.0–0.5)
Eosinophils Relative: 4 %
HCT: 33.8 % — ABNORMAL LOW (ref 36.0–46.0)
Hemoglobin: 11.4 g/dL — ABNORMAL LOW (ref 12.0–15.0)
Immature Granulocytes: 1 %
Lymphocytes Relative: 44 %
Lymphs Abs: 3.4 10*3/uL (ref 0.7–4.0)
MCH: 28.4 pg (ref 26.0–34.0)
MCHC: 33.7 g/dL (ref 30.0–36.0)
MCV: 84.3 fL (ref 80.0–100.0)
Monocytes Absolute: 0.7 10*3/uL (ref 0.1–1.0)
Monocytes Relative: 8 %
Neutro Abs: 3.3 10*3/uL (ref 1.7–7.7)
Neutrophils Relative %: 42 %
Platelets: 196 10*3/uL (ref 150–400)
RBC: 4.01 MIL/uL (ref 3.87–5.11)
RDW: 11.9 % (ref 11.5–15.5)
WBC: 7.8 10*3/uL (ref 4.0–10.5)
nRBC: 0 % (ref 0.0–0.2)

## 2019-06-16 LAB — COMPREHENSIVE METABOLIC PANEL
ALT: 29 U/L (ref 0–44)
AST: 28 U/L (ref 15–41)
Albumin: 3.1 g/dL — ABNORMAL LOW (ref 3.5–5.0)
Alkaline Phosphatase: 210 U/L — ABNORMAL HIGH (ref 38–126)
Anion gap: 4 — ABNORMAL LOW (ref 5–15)
BUN: 17 mg/dL (ref 8–23)
CO2: 28 mmol/L (ref 22–32)
Calcium: 8.5 mg/dL — ABNORMAL LOW (ref 8.9–10.3)
Chloride: 108 mmol/L (ref 98–111)
Creatinine, Ser: 1.31 mg/dL — ABNORMAL HIGH (ref 0.44–1.00)
GFR calc Af Amer: 50 mL/min — ABNORMAL LOW (ref >60)
GFR calc non Af Amer: 44 mL/min — ABNORMAL LOW (ref >60)
Glucose, Bld: 118 mg/dL — ABNORMAL HIGH (ref 70–99)
Potassium: 4.5 mmol/L (ref 3.5–5.1)
Sodium: 140 mmol/L (ref 135–145)
Total Bilirubin: 0.4 mg/dL (ref 0.3–1.2)
Total Protein: 6.4 g/dL — ABNORMAL LOW (ref 6.5–8.1)

## 2019-06-16 LAB — GLUCOSE, CAPILLARY
Glucose-Capillary: 142 mg/dL — ABNORMAL HIGH (ref 70–99)
Glucose-Capillary: 57 mg/dL — ABNORMAL LOW (ref 70–99)
Glucose-Capillary: 90 mg/dL (ref 70–99)
Glucose-Capillary: 271 mg/dL — ABNORMAL HIGH (ref 70–99)
Glucose-Capillary: 183 mg/dL — ABNORMAL HIGH (ref 70–99)

## 2019-06-16 LAB — LIPASE, BLOOD: Lipase: 68 U/L — ABNORMAL HIGH (ref 11–51)

## 2019-06-16 MED ORDER — INSULIN ASPART 100 UNIT/ML ~~LOC~~ SOLN
2.0000 [IU] | Freq: Three times a day (TID) | SUBCUTANEOUS | Status: DC
  Administered 2019-06-16 – 2019-06-18 (×5): 2 [IU] via SUBCUTANEOUS
  Filled 2019-06-16 (×5): qty 1

## 2019-06-16 NOTE — Progress Notes (Signed)
Patient ID: Karen [name], female   DOB: 1955-02-16, 64 y.o.   MRN: [id number] [facility] PROGRESS NOTE  Karen [name]:[id number] DOB: 10-18-1955 DOA: 06/13/2019 PCP: Lavera Guise, MD  HPI/Subjective: Patient feeling a little bit better today.  Still has some abdominal pain but a little bit less in intensity than yesterday.  Wanting to advance diet to solid food today.  Objective: Vitals:   06/16/19 0434 06/16/19 1139  BP: (!) 154/73 (!) 147/73  Pulse: 81 86  Resp: 20 16  Temp: 97.8 F (36.6 C) 99 F (37.2 C)  SpO2: 92% 94%    Intake/Output Summary (Last 24 hours) at 06/16/2019 1536 Last data filed at 06/16/2019 0900 Gross per 24 hour  Intake 2266.38 ml  Output --  Net 2266.38 ml   Filed Weights   06/13/19 0724  Weight: 56.6 kg    ROS: Review of Systems  Constitutional: Negative for fever.  Eyes: Negative for blurred vision.  Respiratory: Negative for shortness of breath.   Cardiovascular: Negative for chest pain.  Gastrointestinal: Positive for abdominal pain and nausea. Negative for constipation, diarrhea and vomiting.  Genitourinary: Negative for dysuria.  Musculoskeletal: Negative for joint pain.  Neurological: Negative for headaches.   Exam: Physical Exam  HENT:  Nose: No mucosal edema.  Mouth/Throat: No oropharyngeal exudate.  Eyes: Pupils are equal, round, and reactive to light. Conjunctivae and lids are normal.  Cardiovascular: S1 normal and S2 normal. Exam reveals no gallop.  No murmur heard. Pulses:      Dorsalis pedis pulses are 2+ on the right side and 2+ on the left side.  Respiratory: No respiratory distress. She has no wheezes. She has no rhonchi. She has no rales.  GI: Soft. She exhibits distension. There is generalized abdominal tenderness.  Musculoskeletal:     Right lower leg: No swelling.     Left lower leg: No swelling.  Neurological: She is alert. No cranial nerve deficit.  Skin: Skin is warm. Nails show no clubbing.  The patient  complains of some rash on the back of her legs.  Only slight redness.      Data Reviewed: Basic Metabolic Panel: Recent Labs  Lab 06/13/19 0729 06/14/19 0535 06/16/19 0419  NA 135 143 140  K 4.9 4.0 4.5  CL 101 111 108  CO2 24 28 28   GLUCOSE 428* 97 118*  BUN 39* 24* 17  CREATININE 1.42* 1.17* 1.31*  CALCIUM 8.9 8.1* 8.5*   Liver Function Tests: Recent Labs  Lab 06/13/19 0729 06/16/19 0419  AST 28 28  ALT 40 29  ALKPHOS 307* 210*  BILITOT 0.6 0.4  PROT 7.5 6.4*  ALBUMIN 3.8 3.1*   Recent Labs  Lab 06/13/19 0729 06/16/19 0419  LIPASE 115* 68*   CBC: Recent Labs  Lab 06/13/19 0729 06/14/19 0535 06/16/19 0419  WBC 14.1* 8.8 7.8  NEUTROABS 10.2*  --  3.3  HGB 13.1 11.3* 11.4*  HCT 38.6 33.7* 33.8*  MCV 84.6 84.0 84.3  PLT 230 218 196    CBG: Recent Labs  Lab 06/15/19 1656 06/15/19 2120 06/16/19 0731 06/16/19 1136 06/16/19 1223  GLUCAP 180* 145* 142* 57* 90    Recent Results (from the past 240 hour(s))  SARS Coronavirus 2 by RT PCR (hospital order, performed in [facility] hospital lab) Nasopharyngeal Nasopharyngeal Swab     Status: None   Collection Time: 06/13/19 11:41 AM   Specimen: Nasopharyngeal Swab  Result Value Ref Range Status   SARS Coronavirus 2  NEGATIVE NEGATIVE Final    Comment: (NOTE) SARS-CoV-2 target nucleic acids are NOT DETECTED. The SARS-CoV-2 RNA is generally detectable in upper and lower respiratory specimens during the acute phase of infection. The lowest concentration of SARS-CoV-2 viral copies this assay can detect is 250 copies / mL. A negative result does not preclude SARS-CoV-2 infection and should not be used as the sole basis for treatment or other patient management decisions.  A negative result may occur with improper specimen collection / handling, submission of specimen other than nasopharyngeal swab, presence of viral mutation(s) within the areas targeted by this assay, and inadequate number of viral  copies (<250 copies / mL). A negative result must be combined with clinical observations, patient history, and epidemiological information. Fact Sheet for Patients:   StrictlyIdeas.no Fact Sheet for Healthcare Providers: BankingDealers.co.za This test is not yet approved or cleared  by the [place] FDA and has been authorized for detection and/or diagnosis of SARS-CoV-2 by FDA under an Emergency Use Authorization (EUA).  This EUA will remain in effect (meaning this test can be used) for the duration of the COVID-19 declaration under Section 564(b)(1) of the Act, 21 U.S.C. section 360bbb-3(b)(1), unless the authorization is terminated or revoked sooner. Performed at [facility], [address] [place], [place] [address]       Scheduled Meds: . amLODipine  10 mg Oral Daily  . docusate sodium  100 mg Oral BID  . enoxaparin (LOVENOX) injection  40 mg Subcutaneous Q24H  . fluticasone  2 spray Each Nare Daily  . insulin aspart  0-5 Units Subcutaneous QHS  . insulin aspart  0-9 Units Subcutaneous TID WC  . insulin aspart  2 Units Subcutaneous TID WC  . insulin glargine  20 Units Subcutaneous Daily  . lipase/protease/amylase  72,000 Units Oral TID WC  . pantoprazole  40 mg Oral Daily  . polyethylene glycol  17 g Oral Daily  . pregabalin  200 mg Oral BID  . QUEtiapine  25 mg Oral QHS  . rosuvastatin  20 mg Oral q1800  . simethicone  80 mg Oral QID  . triamcinolone cream   Topical BID   Continuous Infusions: . sodium chloride 75 mL/hr at 06/16/19 0300    Assessment/Plan:  1. Acute on chronic pancreatitis.  Lipase still little borderline but trending better.  Patient willing to advance to solid food.  If does well with diet today and tomorrow morning I will discharge home.  Patient does have pancreatic divisum which could be an independent risk factor for pancreatitis. 2. Type 2 diabetes mellitus with hyperlipidemia.   Continue glargine insulin and Crestor.  Did have a low sugar prior to lunch.  Cut back on short acting insulin. 3. Diabetic neuropathy on Lyrica 4. Essential hypertension on amlodipine 5. Nasal congestion on Flonase 6. Abdominal distention.  Ultrasound did not show any ascites.  Simethicone. 7. Acute kidney injury.  Creatinine slightly elevated today.  Continue eating and drinking and IV fluids. 8. Leg itching on triamcinolone cream    Code Status:     Code Status Orders  (From admission, onward)         Start     Ordered   06/13/19 1306  Full code  Continuous        06/13/19 1306        Code Status History    Date Active Date Inactive Code Status Order ID Comments User Context   03/15/2019 1459 03/16/2019 1851 Full Code [id number]  Blaine Hamper,  Soledad Gerlach, MD ED   02/26/2019 1529 03/03/2019 1926 Full Code [id number]  Para Skeans, MD ED   01/30/2019 0218 01/31/2019 1721 Full Code [id number]  Athena Masse, MD ED   01/24/2019 1612 01/29/2019 1831 Full Code [id number]  Ivor Costa, MD ED   09/25/2018 1442 09/29/2018 2252 Full Code [id number]  Demetrios Loll, MD ED   11/11/2017 1417 11/13/2017 1948 Full Code [id number]  Hillary Bow, MD ED   04/25/2017 1039 04/26/2017 1415 Full Code [id number]  Schuyler Amor, MD ED   01/03/2017 1730 01/07/2017 1531 Full Code [id number]  Idelle Crouch, MD Inpatient   12/10/2016 1201 12/12/2016 1712 Full Code [id number]  Demetrios Loll, MD Inpatient   10/15/2016 2350 10/17/2016 0056 Full Code [id number]  Dustin Flock, MD Inpatient   04/21/2016 0505 04/24/2016 2031 Full Code [id number]  Saundra Shelling, MD Inpatient   12/31/2015 0739 01/01/2016 2008 Full Code [id number]  Saundra Shelling, MD Inpatient   12/10/2015 1109 12/12/2015 1848 Full Code [id number]  Demetrios Loll, MD Inpatient   10/29/2015 1545 11/01/2015 1623 Full Code [id number]  Theodoro Grist, MD Inpatient   02/17/2015 1704 02/19/2015 1804 Full Code [id number]  Nicholes Mango, MD Inpatient   Advance Care Planning Activity     Disposition  Plan: Status is: Inpatient  Dispo: The patient is from: Home              Anticipated d/c is to: Home              Anticipated d/c date is: 06/17/2019 if tolerates diet today and tomorrow morning              Patient currently being treated for acute on chronic pancreatitis.  Slowly advancing diet.  Time spent: 27 minutes  [place]

## 2019-06-16 NOTE — Progress Notes (Signed)
Inpatient Diabetes Program Recommendations  AACE/ADA: New Consensus Statement on Inpatient Glycemic Control (2015)  Target Ranges:  Prepandial:   less than 140 mg/dL      Peak postprandial:   less than 180 mg/dL (1-2 hours)      Critically ill patients:  140 - 180 mg/dL   Results for LYNZI, MEULEMANS (MRN [id number]) as of 06/16/2019 13:44  Ref. Range 06/15/2019 04:52 06/15/2019 05:24 06/15/2019 07:42 06/15/2019 12:13 06/15/2019 16:56 06/15/2019 21:20  Glucose-Capillary Latest Ref Range: 70 - 99 mg/dL 67 (L) 95 117 (H) 130 (H) 180 (H) 145 (H)   Results for ILMA, ACHEE (MRN [id number]) as of 06/16/2019 13:44  Ref. Range 06/16/2019 07:31 06/16/2019 11:36  Glucose-Capillary Latest Ref Range: 70 - 99 mg/dL 142 (H)  4 units NOVOLOG  57 (L)     Home DM Meds: Lantus 25 units daily       Novolog 4 units TID with meals  Current Orders: Lantus 20 units Daily      Novolog Sensitive Correction Scale/ SSI (0-9 units) TID AC + HS      Novolog 3 units TID with meals     MD- Note patient with Hypoglycemia at 11:30am today after getting 4 units Novolog at 8am (1 unit Novolog SSI + 3 units Meal Coverage)  May consider reducing Novolog Meal Coverage to 2 units TID with meals     --Will follow patient during hospitalization--  Wyn Quaker RN, MSN, CDE Diabetes Coordinator Inpatient Glycemic Control Team Team Pager: [phone] (8a-5p)

## 2019-06-16 NOTE — Progress Notes (Signed)
Hypoglycemic Event  CBG: 57  Treatment: 4oz juice  Symptoms: none  Follow-up CBG: Time:1223 CBG Result:90  Possible Reasons for Event: not eating much  Comments/MD notified: NA    Karen [name] C Ralonda Tartt

## 2019-06-17 DIAGNOSIS — N189 Chronic kidney disease, unspecified: Secondary | ICD-10-CM

## 2019-06-17 LAB — BASIC METABOLIC PANEL
Anion gap: 9 (ref 5–15)
BUN: 27 mg/dL — ABNORMAL HIGH (ref 8–23)
CO2: 26 mmol/L (ref 22–32)
Calcium: 8.4 mg/dL — ABNORMAL LOW (ref 8.9–10.3)
Chloride: 104 mmol/L (ref 98–111)
Creatinine, Ser: 1.36 mg/dL — ABNORMAL HIGH (ref 0.44–1.00)
GFR calc Af Amer: 48 mL/min — ABNORMAL LOW (ref >60)
GFR calc non Af Amer: 42 mL/min — ABNORMAL LOW (ref >60)
Glucose, Bld: 257 mg/dL — ABNORMAL HIGH (ref 70–99)
Potassium: 4.3 mmol/L (ref 3.5–5.1)
Sodium: 139 mmol/L (ref 135–145)

## 2019-06-17 LAB — GLUCOSE, CAPILLARY
Glucose-Capillary: 208 mg/dL — ABNORMAL HIGH (ref 70–99)
Glucose-Capillary: 226 mg/dL — ABNORMAL HIGH (ref 70–99)
Glucose-Capillary: 150 mg/dL — ABNORMAL HIGH (ref 70–99)
Glucose-Capillary: 140 mg/dL — ABNORMAL HIGH (ref 70–99)

## 2019-06-17 LAB — LIPASE, BLOOD: Lipase: 61 U/L — ABNORMAL HIGH (ref 11–51)

## 2019-06-17 NOTE — Progress Notes (Signed)
Patient ID: Karen [name], female   DOB: 10-10-1955, 64 y.o.   MRN: [id number] [facility] PROGRESS NOTE  Karen [name] OEV:[id number] DOB: Aug 08, 1955 DOA: 06/13/2019 PCP: Lavera Guise, MD  HPI/Subjective: Patient having worsening pain after eating.  States she could not eat that much today.  Worse pain after eating a hamburger yesterday.  Not feeling well at all today.  Objective: Vitals:   06/16/19 1946 06/17/19 0436  BP: 108/60 130/60  Pulse: 72 72  Resp: 16 20  Temp: 98.2 F (36.8 C) 98.3 F (36.8 C)  SpO2: 94% 92%    Intake/Output Summary (Last 24 hours) at 06/17/2019 1128 Last data filed at 06/17/2019 1014 Gross per 24 hour  Intake 2518.29 ml  Output --  Net 2518.29 ml   Filed Weights   06/13/19 0724  Weight: 56.6 kg    ROS: Review of Systems  Constitutional: Negative for fever.  Eyes: Negative for blurred vision.  Respiratory: Negative for shortness of breath.   Cardiovascular: Negative for chest pain.  Gastrointestinal: Positive for abdominal pain and nausea. Negative for constipation, diarrhea and vomiting.  Genitourinary: Negative for dysuria.  Musculoskeletal: Negative for joint pain.  Neurological: Negative for headaches.   Exam: Physical Exam  HENT:  Nose: No mucosal edema.  Mouth/Throat: No oropharyngeal exudate.  Eyes: Pupils are equal, round, and reactive to light. Conjunctivae and lids are normal.  Cardiovascular: S1 normal and S2 normal. Exam reveals no gallop.  No murmur heard. Pulses:      Dorsalis pedis pulses are 2+ on the right side and 2+ on the left side.  Respiratory: No respiratory distress. She has no wheezes. She has no rhonchi. She has no rales.  GI: Soft. She exhibits distension. There is generalized abdominal tenderness.  Musculoskeletal:     Right lower leg: No swelling.     Left lower leg: No swelling.  Neurological: She is alert. No cranial nerve deficit.  Skin: Skin is warm. Nails show no clubbing.  The patient complains of  some rash on the back of her legs.  Only slight redness.      Data Reviewed: Basic Metabolic Panel: Recent Labs  Lab 06/13/19 0729 06/14/19 0535 06/16/19 0419 06/17/19 0434  NA 135 143 140 139  K 4.9 4.0 4.5 4.3  CL 101 111 108 104  CO2 24 28 28 26   GLUCOSE 428* 97 118* 257*  BUN 39* 24* 17 27*  CREATININE 1.42* 1.17* 1.31* 1.36*  CALCIUM 8.9 8.1* 8.5* 8.4*   Liver Function Tests: Recent Labs  Lab 06/13/19 0729 06/16/19 0419  AST 28 28  ALT 40 29  ALKPHOS 307* 210*  BILITOT 0.6 0.4  PROT 7.5 6.4*  ALBUMIN 3.8 3.1*   Recent Labs  Lab 06/13/19 0729 06/16/19 0419 06/17/19 0434  LIPASE 115* 68* 61*   CBC: Recent Labs  Lab 06/13/19 0729 06/14/19 0535 06/16/19 0419  WBC 14.1* 8.8 7.8  NEUTROABS 10.2*  --  3.3  HGB 13.1 11.3* 11.4*  HCT 38.6 33.7* 33.8*  MCV 84.6 84.0 84.3  PLT 230 218 196    CBG: Recent Labs  Lab 06/16/19 1136 06/16/19 1223 06/16/19 1634 06/16/19 2158 06/17/19 0738  GLUCAP 57* 90 271* 183* 208*    Recent Results (from the past 240 hour(s))  SARS Coronavirus 2 by RT PCR (hospital order, performed in [facility] hospital lab) Nasopharyngeal Nasopharyngeal Swab     Status: None   Collection Time: 06/13/19 11:41 AM   Specimen: Nasopharyngeal Swab  Result Value Ref Range Status   SARS Coronavirus 2 NEGATIVE NEGATIVE Final    Comment: (NOTE) SARS-CoV-2 target nucleic acids are NOT DETECTED. The SARS-CoV-2 RNA is generally detectable in upper and lower respiratory specimens during the acute phase of infection. The lowest concentration of SARS-CoV-2 viral copies this assay can detect is 250 copies / mL. A negative result does not preclude SARS-CoV-2 infection and should not be used as the sole basis for treatment or other patient management decisions.  A negative result may occur with improper specimen collection / handling, submission of specimen other than nasopharyngeal swab, presence of viral mutation(s) within the areas  targeted by this assay, and inadequate number of viral copies (<250 copies / mL). A negative result must be combined with clinical observations, patient history, and epidemiological information. Fact Sheet for Patients:   StrictlyIdeas.no Fact Sheet for Healthcare Providers: BankingDealers.co.za This test is not yet approved or cleared  by the [place] FDA and has been authorized for detection and/or diagnosis of SARS-CoV-2 by FDA under an Emergency Use Authorization (EUA).  This EUA will remain in effect (meaning this test can be used) for the duration of the COVID-19 declaration under Section 564(b)(1) of the Act, 21 U.S.C. section 360bbb-3(b)(1), unless the authorization is terminated or revoked sooner. Performed at [facility], [address] [place], [place] [address]       Scheduled Meds: . amLODipine  10 mg Oral Daily  . docusate sodium  100 mg Oral BID  . enoxaparin (LOVENOX) injection  40 mg Subcutaneous Q24H  . fluticasone  2 spray Each Nare Daily  . insulin aspart  0-5 Units Subcutaneous QHS  . insulin aspart  0-9 Units Subcutaneous TID WC  . insulin aspart  2 Units Subcutaneous TID WC  . insulin glargine  20 Units Subcutaneous Daily  . lipase/protease/amylase  72,000 Units Oral TID WC  . pantoprazole  40 mg Oral Daily  . polyethylene glycol  17 g Oral Daily  . pregabalin  200 mg Oral BID  . QUEtiapine  25 mg Oral QHS  . rosuvastatin  20 mg Oral q1800  . simethicone  80 mg Oral QID  . triamcinolone cream   Topical BID   Continuous Infusions: . sodium chloride 75 mL/hr at 06/17/19 0651    Assessment/Plan:  1. Acute on chronic pancreatitis.  Lipase still little borderline but trending better.  Patient with worsening pain after eating.  Go back to full liquid diet today.  Reassess tomorrow.  Patient does have pancreatic divisum which could be an independent risk factor for pancreatitis. 2. Type 2  diabetes mellitus with hyperlipidemia.  Continue glargine insulin and Crestor.  3. Diabetic neuropathy on Lyrica 4. Essential hypertension on amlodipine 5. Nasal congestion on Flonase 6. Abdominal distention.  Ultrasound did not show any ascites.  Simethicone. 7. Acute kidney injury on chronic kidney disease stage IIIa.  Continue IV fluids. 8. Leg itching on triamcinolone cream    Code Status:     Code Status Orders  (From admission, onward)         Start     Ordered   06/13/19 1306  Full code  Continuous        06/13/19 1306        Code Status History    Date Active Date Inactive Code Status Order ID Comments User Context   03/15/2019 1459 03/16/2019 1851 Full Code [id number]  Ivor Costa, MD ED   02/26/2019 1529 03/03/2019 1926 Full Code [id number]  Para Skeans, MD ED   01/30/2019 0218 01/31/2019 1721 Full Code [id number]  Athena Masse, MD ED   01/24/2019 1612 01/29/2019 1831 Full Code [id number]  Ivor Costa, MD ED   09/25/2018 1442 09/29/2018 2252 Full Code [id number]  Demetrios Loll, MD ED   11/11/2017 1417 11/13/2017 1948 Full Code [id number]  Hillary Bow, MD ED   04/25/2017 1039 04/26/2017 1415 Full Code [id number]  Schuyler Amor, MD ED   01/03/2017 1730 01/07/2017 1531 Full Code [id number]  Idelle Crouch, MD Inpatient   12/10/2016 1201 12/12/2016 1712 Full Code [id number]  Demetrios Loll, MD Inpatient   10/15/2016 2350 10/17/2016 0056 Full Code [id number]  Dustin Flock, MD Inpatient   04/21/2016 0505 04/24/2016 2031 Full Code [id number]  Saundra Shelling, MD Inpatient   12/31/2015 0739 01/01/2016 2008 Full Code [id number]  Saundra Shelling, MD Inpatient   12/10/2015 1109 12/12/2015 1848 Full Code [id number]  Demetrios Loll, MD Inpatient   10/29/2015 1545 11/01/2015 1623 Full Code [id number]  Theodoro Grist, MD Inpatient   02/17/2015 1704 02/19/2015 1804 Full Code [id number]  Nicholes Mango, MD Inpatient   Advance Care Planning Activity     Disposition Plan: Status is: Inpatient  Dispo: The patient is  from: Home              Anticipated d/c is to: Home              Anticipated d/c date is: 06/18/2019              Patient currently being treated for acute on chronic pancreatitis.  Patient had worsening pain after starting solid food yesterday.  Back to full liquid diet today and reassess tomorrow.  Time spent: 26 minutes  Henry

## 2019-06-18 LAB — COMPREHENSIVE METABOLIC PANEL
ALT: 28 U/L (ref 0–44)
AST: 28 U/L (ref 15–41)
Albumin: 3 g/dL — ABNORMAL LOW (ref 3.5–5.0)
Alkaline Phosphatase: 190 U/L — ABNORMAL HIGH (ref 38–126)
Anion gap: 6 (ref 5–15)
BUN: 21 mg/dL (ref 8–23)
CO2: 29 mmol/L (ref 22–32)
Calcium: 8.6 mg/dL — ABNORMAL LOW (ref 8.9–10.3)
Chloride: 107 mmol/L (ref 98–111)
Creatinine, Ser: 1.19 mg/dL — ABNORMAL HIGH (ref 0.44–1.00)
GFR calc Af Amer: 57 mL/min — ABNORMAL LOW (ref >60)
GFR calc non Af Amer: 49 mL/min — ABNORMAL LOW (ref >60)
Glucose, Bld: 89 mg/dL (ref 70–99)
Potassium: 3.9 mmol/L (ref 3.5–5.1)
Sodium: 142 mmol/L (ref 135–145)
Total Bilirubin: 0.5 mg/dL (ref 0.3–1.2)
Total Protein: 6.1 g/dL — ABNORMAL LOW (ref 6.5–8.1)

## 2019-06-18 LAB — URINALYSIS, COMPLETE (UACMP) WITH MICROSCOPIC
Bacteria, UA: NONE SEEN
Bilirubin Urine: NEGATIVE
Glucose, UA: NEGATIVE mg/dL
Hgb urine dipstick: NEGATIVE
Ketones, ur: NEGATIVE mg/dL
Leukocytes,Ua: NEGATIVE
Nitrite: NEGATIVE
Protein, ur: 30 mg/dL — AB
Specific Gravity, Urine: 1.004 — ABNORMAL LOW (ref 1.005–1.030)
Squamous Epithelial / HPF: NONE SEEN (ref 0–5)
pH: 7 (ref 5.0–8.0)

## 2019-06-18 LAB — GLUCOSE, CAPILLARY
Glucose-Capillary: 103 mg/dL — ABNORMAL HIGH (ref 70–99)
Glucose-Capillary: 154 mg/dL — ABNORMAL HIGH (ref 70–99)

## 2019-06-18 LAB — LIPASE, BLOOD: Lipase: 67 U/L — ABNORMAL HIGH (ref 11–51)

## 2019-06-18 MED ORDER — HYDROCODONE-ACETAMINOPHEN 5-325 MG PO TABS: ORAL_TABLET | ORAL | 0 refills | Status: DC

## 2019-06-18 MED ORDER — INSULIN GLARGINE 100 UNITS/ML SOLOSTAR PEN: 20.0000 [IU] | PEN_INJECTOR | Freq: Every day | SUBCUTANEOUS | 11 refills | Status: DC

## 2019-06-18 MED ORDER — SIMETHICONE 80 MG PO CHEW: 80.0000 mg | CHEWABLE_TABLET | Freq: Four times a day (QID) | ORAL | 0 refills | Status: DC

## 2019-06-18 MED ORDER — TRIAMCINOLONE ACETONIDE 0.5 % EX CREA: TOPICAL_CREAM | CUTANEOUS | 0 refills | Status: DC

## 2019-06-18 MED ORDER — INSULIN PEN NEEDLE 29G X 12MM MISC: 1.0000 | Freq: Three times a day (TID) | 0 refills | Status: DC

## 2019-06-18 MED ORDER — INSULIN ASPART 100 UNIT/ML FLEXPEN
2.0000 [IU] | PEN_INJECTOR | Freq: Three times a day (TID) | SUBCUTANEOUS | 0 refills | Status: DC
Start: 2019-06-18 — End: 2019-11-17

## 2019-06-18 NOTE — Discharge Summary (Signed)
[place] [facility] [place] NAME: Karen [name]    MR#:  [id number]  DATE OF BIRTH:  08-01-1955  DATE OF ADMISSION:  06/13/2019 ADMITTING PHYSICIAN: Loletha Grayer, MD  DATE OF DISCHARGE: 06/18/2019  1:05 PM  PRIMARY CARE PHYSICIAN: Lavera Guise, MD    ADMISSION DIAGNOSIS:  Generalized abdominal pain [R10.84] Hyperglycemia [R73.9] Abdominal pain [R10.9] Acute on chronic pancreatitis ([place]) [K85.90, K86.1] Chronic pancreatitis ([place]) [K86.1]  DISCHARGE DIAGNOSIS:  Principal Problem:   Abdominal pain Active Problems:   ETOH abuse   Chronic pancreatitis (HCC)   HLD (hyperlipidemia)   Acute kidney injury superimposed on CKD (HCC)   Acute on chronic pancreatitis (HCC)   CKD (chronic kidney disease), stage IIIa   HTN (hypertension)   Type II diabetes mellitus with renal manifestations (HCC)   Leukocytosis   Diabetic polyneuropathy associated with type 2 diabetes mellitus (HCC)   Itching   SECONDARY DIAGNOSIS:   Past Medical History:  Diagnosis Date  . Alcoholic pancreatitis   . Asthma   . Depression   . Diabetes mellitus without complication ([place])   . DKA (diabetic ketoacidoses) ([place]) 02/17/2015  . Emphysematous cystitis   . Hypercholesteremia   . Hypertension   . Hypokalemia     HOSPITAL COURSE:   1.  Acute on chronic pancreatitis.  Lipase still slightly elevated at 67 but patient feeling better.  Able to tolerate breakfast this morning.  If she tolerates lunch wants to go home today.  Patient does have pancreatic divisum which could be independent risk factor for pancreatitis.  Follow-up with her pain management doctor as outpatient.  I did prescribe her usual medication 6 times a day for 3 days and then can go back to her 4 times a day regimen after that.  Can consider long-acting pain medication as outpatient.  Can consider looking into celiac nerve block procedure as outpatient.  Not sure if a tertiary care center would do anything for  the pancreatic divisum at this point. 2.  Type 2 diabetes mellitus with hyperlipidemia.  Continue glargine insulin.  Added short acting insulin.  Continue Crestor. 3.  Diabetic neuropathy on Lyrica 4.  Essential hypertension on amlodipine 5.  Nasal congestion on Flonase 6.  Abdominal distention.  I did order some simethicone.  Ultrasound did not show any ascites. 7.  Acute kidney injury on chronic kidney disease stage IIIa.  Creatinine did come down to 1.19 upon discharge. 8.  Patient complains of feeling like she had a urinary tract infection.  Urine analysis negative.  Urine culture sent off but unlikely to be positive.  Hold off on antibiotic at this point. 9.  Leg itching on triamcinolone cream.  DISCHARGE CONDITIONS:   Satisfactory  CONSULTS OBTAINED:   None DRUG ALLERGIES:   Allergies  Allergen Reactions  . Penicillins Anaphylaxis    Has patient had a PCN reaction causing immediate rash, facial/tongue/throat swelling, SOB or lightheadedness with hypotension: Yes Has patient had a PCN reaction causing severe rash involving mucus membranes or skin necrosis: No Has patient had a PCN reaction that required hospitalization No Has patient had a PCN reaction occurring within the last 10 years: No If all of the above answers are "NO", then may proceed with Cephalosporin use.  . Reglan [Metoclopramide] Other (See Comments)    Hypotension, shortness of breath  . Fentanyl     Rash   . Percocet [Oxycodone-Acetaminophen] Rash    plain tylenol can also make itch?  DISCHARGE MEDICATIONS:   Allergies as of 06/18/2019      Reactions   Penicillins Anaphylaxis   Has patient had a PCN reaction causing immediate rash, facial/tongue/throat swelling, SOB or lightheadedness with hypotension: Yes Has patient had a PCN reaction causing severe rash involving mucus membranes or skin necrosis: No Has patient had a PCN reaction that required hospitalization No Has patient had a PCN reaction  occurring within the last 10 years: No If all of the above answers are "NO", then may proceed with Cephalosporin use.   Reglan [metoclopramide] Other (See Comments)   Hypotension, shortness of breath   Fentanyl    Rash   Percocet [oxycodone-acetaminophen] Rash   plain tylenol can also make itch?      Medication List    STOP taking these medications   NovoLOG Mix 70/30 FlexPen (70-30) 100 UNIT/ML FlexPen Generic drug: insulin aspart protamine - aspart     TAKE these medications   amLODipine 10 MG tablet Commonly known as: NORVASC Take 10 mg by mouth daily. Notes to patient: Morning 06/19/19   cyclobenzaprine 5 MG tablet Commonly known as: FLEXERIL Take 5 mg by mouth in the morning and at bedtime. Notes to patient: Before bed 06/18/19   docusate sodium 100 MG capsule Commonly known as: COLACE Take 100 mg by mouth 2 (two) times daily. Notes to patient: Before bed 06/18/19   HYDROcodone-acetaminophen 5-325 MG tablet Commonly known as: NORCO/VICODIN One tablet every four hours as needed for three days then go back to you usual one tablet every six hours What changed:   how much to take  how to take this  when to take this  reasons to take this  additional instructions Notes to patient: As needed   insulin aspart 100 UNIT/ML FlexPen Commonly known as: NOVOLOG Inject 2 Units into the skin 3 (three) times daily with meals. Notes to patient: With evening meal 06/18/19   insulin glargine 100 unit/mL Sopn Commonly known as: LANTUS Inject 0.2 mLs (20 Units total) into the skin at bedtime. What changed: how much to take Notes to patient: Before bed 06/18/19   Insulin Pen Needle 29G X 12MM Misc 1 Dose by Does not apply route 3 (three) times daily before meals. Notes to patient: Before evening meal   lipase/protease/amylase 36000 UNITS Cpep capsule Commonly known as: Creon Take 2 capsules (72,000 Units total) by mouth 3 (three) times daily with meals. Notes to patient:  With evening meal   omeprazole 40 MG capsule Commonly known as: PRILOSEC Take 40 mg by mouth in the morning and at bedtime. Notes to patient: Before bed 06/18/19   ondansetron 4 MG disintegrating tablet Commonly known as: ZOFRAN-ODT Take 4 mg by mouth every 8 (eight) hours as needed for nausea or vomiting. Notes to patient: As needed   Polyethylene Glycol 3350 Powd in the morning and at bedtime. Mix 17 grams in 4-8 oz of liquid and drink Notes to patient: Before bed 06/18/19   pregabalin 200 MG capsule Commonly known as: LYRICA Take 200 mg by mouth 2 (two) times daily. Notes to patient: Before bed 06/18/19   QUEtiapine 25 MG tablet Commonly known as: SEROQUEL Take 25 mg by mouth at bedtime. Notes to patient: Before bed 06/18/19   rosuvastatin 20 MG tablet Commonly known as: CRESTOR Take 20 mg by mouth daily. Notes to patient: Morning 06/19/19   simethicone 80 MG chewable tablet Commonly known as: MYLICON Chew 1 tablet (80 mg total) by mouth 4 (four) times daily.  Notes to patient: Afternoon 06/18/19   triamcinolone cream 0.5 % Commonly known as: KENALOG Apply twice a day to back of thighs Notes to patient: Before bed 06/18/19        DISCHARGE INSTRUCTIONS:   Follow-up PMD 5 days Follow-up with your pain management doctor  If you experience worsening of your admission symptoms, develop shortness of breath, life threatening emergency, suicidal or homicidal thoughts you must seek medical attention immediately by calling 911 or calling your MD immediately  if symptoms less severe.  You Must read complete instructions/literature along with all the possible adverse reactions/side effects for all the Medicines you take and that have been prescribed to you. Take any new Medicines after you have completely understood and accept all the possible adverse reactions/side effects.   Please note  You were cared for by a hospitalist during your hospital stay. If you have any questions  about your discharge medications or the care you received while you were in the hospital after you are discharged, you can call the unit and asked to speak with the hospitalist on call if the hospitalist that took care of you is not available. Once you are discharged, your primary care physician will handle any further medical issues. Please note that NO REFILLS for any discharge medications will be authorized once you are discharged, as it is imperative that you return to your primary care physician (or establish a relationship with a primary care physician if you do not have one) for your aftercare needs so that they can reassess your need for medications and monitor your lab values.    Today   CHIEF COMPLAINT:   Chief Complaint  Patient presents with  . Abdominal Pain    HISTORY OF PRESENT ILLNESS:  Karen [name]  is a 64 y.o. female came in with abdominal pain   VITAL SIGNS:  Blood pressure 135/75, pulse 68, temperature 97.8 F (36.6 C), temperature source Oral, resp. rate 18, height 5\' 1"  (1.549 m), weight 56.6 kg, SpO2 99 %.    PHYSICAL EXAMINATION:  GENERAL:  64 y.o.-year-old patient lying in the bed with no acute distress.  EYES: Pupils equal, round, reactive to light and accommodation. No scleral icterus. Extraocular muscles intact.  HEENT: Head atraumatic, normocephalic. Oropharynx and nasopharynx clear.  LUNGS: Normal breath sounds bilaterally, no wheezing, rales,rhonchi or crepitation. No use of accessory muscles of respiration.  CARDIOVASCULAR: S1, S2 normal. No murmurs, rubs, or gallops.  ABDOMEN: Soft, slight abdominal tenderness, distended. Bowel sounds present. No organomegaly or mass.  EXTREMITIES: No pedal edema, cyanosis, or clubbing.  NEUROLOGIC: Cranial nerves II through XII are intact. Muscle strength 5/5 in all extremities. Sensation intact. Gait not checked.  PSYCHIATRIC: The patient is alert and oriented x 3.  SKIN: No obvious rash, lesion, or ulcer.   DATA  REVIEW:   CBC Recent Labs  Lab 06/16/19 0419  WBC 7.8  HGB 11.4*  HCT 33.8*  PLT 196    Chemistries  Recent Labs  Lab 06/18/19 0600  NA 142  K 3.9  CL 107  CO2 29  GLUCOSE 89  BUN 21  CREATININE 1.19*  CALCIUM 8.6*  AST 28  ALT 28  ALKPHOS 190*  BILITOT 0.5    Microbiology Results  Results for orders placed or performed during the hospital encounter of 06/13/19  SARS Coronavirus 2 by RT PCR (hospital order, performed in [facility] hospital lab) Nasopharyngeal Nasopharyngeal Swab     Status: None   Collection Time: 06/13/19 11:41  AM   Specimen: Nasopharyngeal Swab  Result Value Ref Range Status   SARS Coronavirus 2 NEGATIVE NEGATIVE Final    Comment: (NOTE) SARS-CoV-2 target nucleic acids are NOT DETECTED. The SARS-CoV-2 RNA is generally detectable in upper and lower respiratory specimens during the acute phase of infection. The lowest concentration of SARS-CoV-2 viral copies this assay can detect is 250 copies / mL. A negative result does not preclude SARS-CoV-2 infection and should not be used as the sole basis for treatment or other patient management decisions.  A negative result may occur with improper specimen collection / handling, submission of specimen other than nasopharyngeal swab, presence of viral mutation(s) within the areas targeted by this assay, and inadequate number of viral copies (<250 copies / mL). A negative result must be combined with clinical observations, patient history, and epidemiological information. Fact Sheet for Patients:   StrictlyIdeas.no Fact Sheet for Healthcare Providers: BankingDealers.co.za This test is not yet approved or cleared  by the [place] FDA and has been authorized for detection and/or diagnosis of SARS-CoV-2 by FDA under an Emergency Use Authorization (EUA).  This EUA will remain in effect (meaning this test can be used) for the duration of the COVID-19  declaration under Section 564(b)(1) of the Act, 21 U.S.C. section 360bbb-3(b)(1), unless the authorization is terminated or revoked sooner. Performed at [facility], [address], [place], [place] [address]       Management plans discussed with the patient, and she is in agreement.  CODE STATUS:     Code Status Orders  (From admission, onward)         Start     Ordered   06/13/19 1306  Full code  Continuous        06/13/19 1306        Code Status History    Date Active Date Inactive Code Status Order ID Comments User Context   03/15/2019 1459 03/16/2019 1851 Full Code [id number]  Ivor Costa, MD ED   02/26/2019 1529 03/03/2019 1926 Full Code [id number]  Para Skeans, MD ED   01/30/2019 0218 01/31/2019 1721 Full Code [id number]  Athena Masse, MD ED   01/24/2019 1612 01/29/2019 1831 Full Code [id number]  Ivor Costa, MD ED   09/25/2018 1442 09/29/2018 2252 Full Code [id number]  Demetrios Loll, MD ED   11/11/2017 1417 11/13/2017 1948 Full Code [id number]  Hillary Bow, MD ED   04/25/2017 1039 04/26/2017 1415 Full Code [id number]  Schuyler Amor, MD ED   01/03/2017 1730 01/07/2017 1531 Full Code [id number]  Idelle Crouch, MD Inpatient   12/10/2016 1201 12/12/2016 1712 Full Code [id number]  Demetrios Loll, MD Inpatient   10/15/2016 2350 10/17/2016 0056 Full Code [id number]  Dustin Flock, MD Inpatient   04/21/2016 0505 04/24/2016 2031 Full Code [id number]  Saundra Shelling, MD Inpatient   12/31/2015 0739 01/01/2016 2008 Full Code [id number]  Saundra Shelling, MD Inpatient   12/10/2015 1109 12/12/2015 1848 Full Code [id number]  Demetrios Loll, MD Inpatient   10/29/2015 1545 11/01/2015 1623 Full Code [id number]  Theodoro Grist, MD Inpatient   02/17/2015 1704 02/19/2015 1804 Full Code [id number]  Nicholes Mango, MD Inpatient   Advance Care Planning Activity      TOTAL TIME TAKING CARE OF THIS PATIENT: 35 minutes.    Loletha Grayer M.D on 06/18/2019 at 2:33 PM  Between 7am to 6pm - Pager -  [phone]  After 6pm go to [email or web address] - password [email or web address]  [facility]  CC: Primary care physician;  Lavera Guise, MD

## 2019-06-19 LAB — URINE CULTURE: Culture: NO GROWTH

## 2019-07-01 ENCOUNTER — Emergency Department
Admission: EM | Admit: 2019-07-01 | Discharge: 2019-07-01 | Disposition: A | Discharge status: Home / Self Care | Payer: Medicaid Other | Attending: Emergency Medicine | Admitting: Emergency Medicine

## 2019-07-01 ENCOUNTER — Other Ambulatory Visit: Payer: Self-pay

## 2019-07-01 ENCOUNTER — Emergency Department: Payer: Medicaid Other

## 2019-07-01 DIAGNOSIS — I10 Essential (primary) hypertension: Secondary | ICD-10-CM | POA: Insufficient documentation

## 2019-07-01 DIAGNOSIS — E860 Dehydration: Secondary | ICD-10-CM | POA: Diagnosis not present

## 2019-07-01 DIAGNOSIS — E1142 Type 2 diabetes mellitus with diabetic polyneuropathy: Secondary | ICD-10-CM | POA: Diagnosis not present

## 2019-07-01 DIAGNOSIS — R109 Unspecified abdominal pain: Secondary | ICD-10-CM | POA: Diagnosis present

## 2019-07-01 DIAGNOSIS — J45909 Unspecified asthma, uncomplicated: Secondary | ICD-10-CM | POA: Diagnosis not present

## 2019-07-01 DIAGNOSIS — R61 Generalized hyperhidrosis: Secondary | ICD-10-CM | POA: Insufficient documentation

## 2019-07-01 DIAGNOSIS — Z794 Long term (current) use of insulin: Secondary | ICD-10-CM | POA: Diagnosis not present

## 2019-07-01 DIAGNOSIS — Z20822 Contact with and (suspected) exposure to covid-19: Secondary | ICD-10-CM | POA: Insufficient documentation

## 2019-07-01 DIAGNOSIS — Z888 Allergy status to other drugs, medicaments and biological substances status: Secondary | ICD-10-CM | POA: Diagnosis not present

## 2019-07-01 DIAGNOSIS — R531 Weakness: Secondary | ICD-10-CM | POA: Insufficient documentation

## 2019-07-01 DIAGNOSIS — Z87891 Personal history of nicotine dependence: Secondary | ICD-10-CM | POA: Diagnosis not present

## 2019-07-01 DIAGNOSIS — Z79899 Other long term (current) drug therapy: Secondary | ICD-10-CM | POA: Diagnosis not present

## 2019-07-01 DIAGNOSIS — Z791 Long term (current) use of non-steroidal anti-inflammatories (NSAID): Secondary | ICD-10-CM | POA: Insufficient documentation

## 2019-07-01 DIAGNOSIS — R112 Nausea with vomiting, unspecified: Secondary | ICD-10-CM | POA: Insufficient documentation

## 2019-07-01 DIAGNOSIS — R519 Headache, unspecified: Secondary | ICD-10-CM | POA: Insufficient documentation

## 2019-07-01 DIAGNOSIS — Z885 Allergy status to narcotic agent status: Secondary | ICD-10-CM | POA: Insufficient documentation

## 2019-07-01 DIAGNOSIS — E1110 Type 2 diabetes mellitus with ketoacidosis without coma: Secondary | ICD-10-CM | POA: Diagnosis not present

## 2019-07-01 DIAGNOSIS — Z880 Allergy status to penicillin: Secondary | ICD-10-CM | POA: Diagnosis not present

## 2019-07-01 DIAGNOSIS — R6883 Chills (without fever): Secondary | ICD-10-CM | POA: Diagnosis not present

## 2019-07-01 LAB — URINALYSIS, COMPLETE (UACMP) WITH MICROSCOPIC
Bacteria, UA: NONE SEEN
Bilirubin Urine: NEGATIVE
Glucose, UA: NEGATIVE mg/dL
Hgb urine dipstick: NEGATIVE
Ketones, ur: NEGATIVE mg/dL
Leukocytes,Ua: NEGATIVE
Nitrite: NEGATIVE
Protein, ur: 100 mg/dL — AB
Specific Gravity, Urine: 1.003 — ABNORMAL LOW (ref 1.005–1.030)
pH: 6 (ref 5.0–8.0)

## 2019-07-01 LAB — GLUCOSE, CAPILLARY: Glucose-Capillary: 88 mg/dL (ref 70–99)

## 2019-07-01 LAB — COMPREHENSIVE METABOLIC PANEL
ALT: 36 U/L (ref 0–44)
AST: 42 U/L — ABNORMAL HIGH (ref 15–41)
Albumin: 4.5 g/dL (ref 3.5–5.0)
Alkaline Phosphatase: 205 U/L — ABNORMAL HIGH (ref 38–126)
Anion gap: 15 (ref 5–15)
BUN: 33 mg/dL — ABNORMAL HIGH (ref 8–23)
CO2: 22 mmol/L (ref 22–32)
Calcium: 9.4 mg/dL (ref 8.9–10.3)
Chloride: 100 mmol/L (ref 98–111)
Creatinine, Ser: 1.45 mg/dL — ABNORMAL HIGH (ref 0.44–1.00)
GFR calc Af Amer: 45 mL/min — ABNORMAL LOW (ref >60)
GFR calc non Af Amer: 38 mL/min — ABNORMAL LOW (ref >60)
Glucose, Bld: 99 mg/dL (ref 70–99)
Potassium: 4.3 mmol/L (ref 3.5–5.1)
Sodium: 137 mmol/L (ref 135–145)
Total Bilirubin: 1.1 mg/dL (ref 0.3–1.2)
Total Protein: 8.7 g/dL — ABNORMAL HIGH (ref 6.5–8.1)

## 2019-07-01 LAB — CBC
HCT: 41.1 % (ref 36.0–46.0)
Hemoglobin: 14 g/dL (ref 12.0–15.0)
MCH: 28.5 pg (ref 26.0–34.0)
MCHC: 34.1 g/dL (ref 30.0–36.0)
MCV: 83.5 fL (ref 80.0–100.0)
Platelets: 301 10*3/uL (ref 150–400)
RBC: 4.92 MIL/uL (ref 3.87–5.11)
RDW: 11.9 % (ref 11.5–15.5)
WBC: 10.1 10*3/uL (ref 4.0–10.5)
nRBC: 0 % (ref 0.0–0.2)

## 2019-07-01 LAB — SARS CORONAVIRUS 2 BY RT PCR (HOSPITAL ORDER, PERFORMED IN ~~LOC~~ HOSPITAL LAB): SARS Coronavirus 2: NEGATIVE

## 2019-07-01 LAB — LIPASE, BLOOD: Lipase: 84 U/L — ABNORMAL HIGH (ref 11–51)

## 2019-07-01 LAB — AMMONIA: Ammonia: 14 umol/L (ref 9–35)

## 2019-07-01 MED ORDER — FAMOTIDINE 20 MG PO TABS
20.0000 mg | ORAL_TABLET | Freq: Two times a day (BID) | ORAL | 0 refills | Status: DC
Start: 2019-07-01 — End: 2019-11-17

## 2019-07-01 MED ORDER — MORPHINE SULFATE (PF) 4 MG/ML IV SOLN
4.0000 mg | Freq: Once | INTRAVENOUS | Status: AC
  Administered 2019-07-01: 4 mg via INTRAVENOUS
  Filled 2019-07-01: qty 1

## 2019-07-01 MED ORDER — IOHEXOL 300 MG/ML  SOLN
60.0000 mL | Freq: Once | INTRAMUSCULAR | Status: AC | PRN
  Administered 2019-07-01: 60 mL via INTRAVENOUS

## 2019-07-01 MED ORDER — ONDANSETRON 4 MG PO TBDP
4.0000 mg | ORAL_TABLET | Freq: Three times a day (TID) | ORAL | 0 refills | Status: DC | PRN
Start: 2019-07-01 — End: 2019-11-17

## 2019-07-01 MED ORDER — DIPHENHYDRAMINE HCL 50 MG/ML IJ SOLN
25.0000 mg | Freq: Once | INTRAMUSCULAR | Status: AC
  Administered 2019-07-01: 25 mg via INTRAVENOUS
  Filled 2019-07-01: qty 1

## 2019-07-01 MED ORDER — SODIUM CHLORIDE 0.9 % IV BOLUS
500.0000 mL | Freq: Once | INTRAVENOUS | Status: AC
  Administered 2019-07-01: 500 mL via INTRAVENOUS

## 2019-07-01 MED ORDER — ONDANSETRON HCL 4 MG/2ML IJ SOLN
4.0000 mg | Freq: Once | INTRAMUSCULAR | Status: AC
  Administered 2019-07-01: 4 mg via INTRAVENOUS
  Filled 2019-07-01: qty 2

## 2019-07-01 NOTE — ED Notes (Signed)
Pt trx to CT.  

## 2019-07-01 NOTE — ED Notes (Signed)
Answered pt's call bell. Introduced myself to pt.  Assisted pt to use the bathroom.

## 2019-07-01 NOTE — ED Notes (Signed)
Provided pt w/ phone to call ride for DC

## 2019-07-01 NOTE — ED Triage Notes (Addendum)
Pt states her head hurts on top and her tongue feels tight. Tongue goes to the R. Pt states feels weak. State R sided sensation less. Pt states she woke up at 2am with HA. States was dizzy and sweaty before she went to bed last night. States has been feeling bad x 2 days. Pt also c/o nosebleed this AM.

## 2019-07-01 NOTE — ED Triage Notes (Signed)
Pt arrives [facility] from home for HA, pancreatitis, body pain, N&V, sweating. EMS reports seen for same recently. EMS VS: HR 80, 100% RA, 159/84. A&O, ambulatory.

## 2019-07-01 NOTE — ED Provider Notes (Signed)
Karen [name]  ____________________________________________  Time seen: Approximately 10:56 AM  I have reviewed the triage vital signs and the nursing notes.   HISTORY  Chief Complaint Abdominal Pain and Headache    HPI Karen [name] is a 64 y.o. female with a history of diabetes, hypertension, pancreatitis CKD who comes the ED complaining of generalized weakness for the past 3 days, gradual onset and worsening, associated with body aches, chills, diffuse headache, nausea vomiting, night sweats.  Denies focal vision change, tingling or weakness.  No falls or trauma.  No hematemesis or bloody stool.  She does feel like she is having some difficulty speaking during this time as well..      Past Medical History:  Diagnosis Date  . Alcoholic pancreatitis   . Asthma   . Depression   . Diabetes mellitus without complication ([place])   . DKA (diabetic ketoacidoses) ([place]) 02/17/2015  . Emphysematous cystitis   . Hypercholesteremia   . Hypertension   . Hypokalemia      Patient Active Problem List   Diagnosis Date Noted  . Itching   . Diabetic polyneuropathy associated with type 2 diabetes mellitus ([place])   . Leukocytosis 06/13/2019  . Chronic kidney disease (CKD), stage II (mild)   . CKD (chronic kidney disease), stage IIIa 03/15/2019  . HTN (hypertension) 03/15/2019  . Type II diabetes mellitus with renal manifestations ([place]) 03/15/2019  . Headache 02/26/2019  . Dizziness 02/26/2019  . Hyperglycemia due to type 2 diabetes mellitus ([place]) 01/30/2019  . Acute pancreatitis 01/30/2019  . Constipation   . Acute on chronic pancreatitis ([place]) 01/25/2019  . Acute pain of left shoulder   . Adhesive capsulitis of left shoulder   . Abnormal LFTs 01/24/2019  . Cough 01/24/2019  . Hypertension   . HLD (hyperlipidemia)   . Type 2 diabetes mellitus with hyperlipidemia ([place])   . GERD (gastroesophageal reflux disease)   . Acute  kidney injury superimposed on CKD ([place])   . Pancreatitis, recurrent   . Chronic pancreatitis ([place]) 09/25/2018  . 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 Coffee ground emesis   . Upper GI bleed 11/11/2017  . Pancreatitis, acute 01/03/2017  . ETOH abuse 01/03/2017  . UTI (urinary tract infection) 01/03/2017  . Nausea and vomiting   . Esophageal candidiasis ([place])   . Intractable vomiting with nausea   . Hypertensive urgency 04/21/2016  . Abdominal pain   . Nausea vomiting and diarrhea   . Smoker   . Poorly controlled type 2 diabetes mellitus ([place])   . Acute renal insufficiency 10/29/2015  . Elevated troponin 10/29/2015  . Urinary tract infection without hematuria 10/29/2015  . Left flank pain 10/29/2015  . Malignant essential hypertension 10/29/2015  . DKA (diabetic ketoacidoses) ([place]) 02/17/2015  . Emphysematous cystitis      Past Surgical History:  Procedure Laterality Date  . ABDOMINAL HYSTERECTOMY  1996  . APPENDECTOMY  1997  . ESOPHAGOGASTRODUODENOSCOPY N/A 11/12/2017   Procedure: ESOPHAGOGASTRODUODENOSCOPY (EGD);  Surgeon: Lin Landsman, MD;  Location: [facility] ENDOSCOPY;  Service: Gastroenterology;  Laterality: N/A;  . ESOPHAGOGASTRODUODENOSCOPY N/A 09/29/2018   Procedure: ESOPHAGOGASTRODUODENOSCOPY (EGD);  Surgeon: Toledo, Benay Pike, MD;  Location: [facility] ENDOSCOPY;  Service: Gastroenterology;  Laterality: N/A;  . ESOPHAGOGASTRODUODENOSCOPY (EGD) WITH PROPOFOL N/A 04/24/2016   Procedure: ESOPHAGOGASTRODUODENOSCOPY (EGD) WITH PROPOFOL;  Surgeon: [name], MD;  Location: [facility] ENDOSCOPY;  Service: Endoscopy;  Laterality: N/A;  . ESOPHAGOGASTRODUODENOSCOPY (EGD) WITH PROPOFOL N/A 01/28/2019   Procedure: ESOPHAGOGASTRODUODENOSCOPY (EGD)  WITH PROPOFOL;  Surgeon: Toledo, Benay Pike, MD;  Location: [facility] ENDOSCOPY;  Service: Gastroenterology;  Laterality: N/A;  . HAND SURGERY  1988  . THYROID SURGERY  2013     Prior to Admission medications   Medication Sig Start Date End Date  Taking? Authorizing Provider  amLODipine (NORVASC) 10 MG tablet Take 10 mg by mouth daily.    [provider]  cyclobenzaprine (FLEXERIL) 5 MG tablet Take 5 mg by mouth in the morning and at bedtime.    [provider]  docusate sodium (COLACE) 100 MG capsule Take 100 mg by mouth 2 (two) times daily.    [provider]  famotidine (PEPCID) 20 MG tablet Take 1 tablet (20 mg total) by mouth 2 (two) times daily. 07/01/19   Carrie Mew, MD  HYDROcodone-acetaminophen (NORCO/VICODIN) 5-325 MG tablet One tablet every four hours as needed for three days then go back to you usual one tablet every six hours 06/18/19   Loletha Grayer, MD  insulin aspart (NOVOLOG) 100 UNIT/ML FlexPen Inject 2 Units into the skin 3 (three) times daily with meals. 06/18/19   Loletha Grayer, MD  insulin glargine (LANTUS) 100 unit/mL SOPN Inject 0.2 mLs (20 Units total) into the skin at bedtime. 06/18/19   Wieting, Richard, MD  Insulin Pen Needle 29G X 12MM MISC 1 Dose by Does not apply route 3 (three) times daily before meals. 06/18/19   Loletha Grayer, MD  lipase/protease/amylase (CREON) 36000 UNITS CPEP capsule Take 2 capsules (72,000 Units total) by mouth 3 (three) times daily with meals. 01/29/19   Loletha Grayer, MD  omeprazole (PRILOSEC) 40 MG capsule Take 40 mg by mouth in the morning and at bedtime.    [provider]  ondansetron (ZOFRAN ODT) 4 MG disintegrating tablet Take 1 tablet (4 mg total) by mouth every 8 (eight) hours as needed for nausea or vomiting. 07/01/19   Carrie Mew, MD  ondansetron (ZOFRAN-ODT) 4 MG disintegrating tablet Take 4 mg by mouth every 8 (eight) hours as needed for nausea or vomiting.    [provider]  Polyethylene Glycol 3350 POWD in the morning and at bedtime. Mix 17 grams in 4-8 oz of liquid and drink    [provider]  pregabalin (LYRICA) 200 MG capsule Take 200 mg by mouth 2 (two) times daily.     [provider]   QUEtiapine (SEROQUEL) 25 MG tablet Take 25 mg by mouth at bedtime.    [provider]  rosuvastatin (CRESTOR) 20 MG tablet Take 20 mg by mouth daily.    [provider]  simethicone (MYLICON) 80 MG chewable tablet Chew 1 tablet (80 mg total) by mouth 4 (four) times daily. 06/18/19   Loletha Grayer, MD  triamcinolone cream (KENALOG) 0.5 % Apply twice a day to back of thighs 06/18/19   Loletha Grayer, MD  GLUCERNA [facility]) LIQD Take 237 mLs by mouth 3 (three) times daily between meals. 02/19/15 02/27/15  Bettey Costa, MD     Allergies Penicillins, Reglan [metoclopramide], Fentanyl, and Percocet [oxycodone-acetaminophen]   Family History  Family history unknown: Yes    Social History Social History   Tobacco Use  . Smoking status: Former Research scientist (life sciences)  . Smokeless tobacco: Never Used  . Tobacco comment: quit 7 years ago   Vaping Use  . Vaping Use: Some days  Substance Use Topics  . Alcohol use: Not Currently    Comment: quit drinking 2 years ago   . Drug use: No    Review  of Systems  Constitutional:   No fever positive chills.  ENT:   No sore throat. No rhinorrhea. Cardiovascular:   No chest pain or syncope. Respiratory:   No dyspnea or cough. Gastrointestinal:   Positive for generalized abdominal pain with vomiting and loose bowel movements. Musculoskeletal:   Negative for focal pain or swelling All other systems reviewed and are negative except as documented above in ROS and HPI.  ____________________________________________   PHYSICAL EXAM:  VITAL SIGNS: ED Triage Vitals  Enc Vitals Group     BP 07/01/19 1026 (!) 164/83     Pulse Rate 07/01/19 1026 84     Resp 07/01/19 1026 18     Temp 07/01/19 1044 98.8 F (37.1 C)     Temp Source 07/01/19 1044 Oral     SpO2 07/01/19 1026 99 %     Weight 07/01/19 1023 120 lb (54.4 kg)     Height 07/01/19 1023 5\' 1"  (1.549 m)     Head Circumference --      Peak Flow --      Pain Score 07/01/19 1018 10      Pain Loc --      Pain Edu? --      Excl. in [place]? --     Vital signs reviewed, nursing assessments reviewed.   Constitutional:   Alert and oriented. Non-toxic appearance. Eyes:   Conjunctivae are normal. EOMI. PERRL. ENT      Head:   Normocephalic and atraumatic.      Nose:   Normal      Mouth/Throat: Dry mucous membranes.  No pharyngeal erythema.  No tongue deviation      Neck:   No meningismus. Full ROM.  No midline spinal tenderness or tenderness along the course of the carotid and vertebral arteries Hematological/Lymphatic/Immunilogical:   No cervical lymphadenopathy. Cardiovascular:   RRR. Symmetric bilateral radial and DP pulses.  No murmurs. Cap refill less than 2 seconds. Respiratory:   Normal respiratory effort without tachypnea/retractions. Breath sounds are clear and equal bilaterally. No wheezes/rales/rhonchi. Gastrointestinal:   Soft with mild generalized tenderness, nonfocal. Non distended. There is no CVA tenderness.  No rebound, rigidity, or guarding.  Musculoskeletal:   Normal range of motion in all extremities. No joint effusions.  No lower extremity tenderness.  No edema. Neurologic:   Mildly dysarthric speech, normal language.  Motor grossly intact.  No drift Normal cerebellar function, symmetric sensation NIH stroke scale 1 Skin:    Skin is warm, dry and intact. No rash noted.  No petechiae, purpura, or bullae.  ____________________________________________    LABS (pertinent positives/negatives) (all labs ordered are listed, but only abnormal results are displayed) Labs Reviewed  LIPASE, BLOOD - Abnormal; Notable for the following components:      Result Value   Lipase 84 (*)    All other components within normal limits  COMPREHENSIVE METABOLIC PANEL - Abnormal; Notable for the following components:   BUN 33 (*)    Creatinine, Ser 1.45 (*)    Total Protein 8.7 (*)    AST 42 (*)    Alkaline Phosphatase 205 (*)    GFR calc non Af Amer 38 (*)    GFR calc  Af Amer 45 (*)    All other components within normal limits  URINALYSIS, COMPLETE (UACMP) WITH MICROSCOPIC - Abnormal; Notable for the following components:   Color, Urine STRAW (*)    APPearance CLEAR (*)    Specific Gravity, Urine 1.003 (*)    Protein, ur  100 (*)    All other components within normal limits  SARS CORONAVIRUS 2 BY RT PCR (HOSPITAL ORDER, [facility])  GLUCOSE, CAPILLARY  CBC  AMMONIA   ____________________________________________   EKG  Interpreted by me  Date: 07/01/2019  Rate: 80  Rhythm: normal sinus rhythm  QRS Axis: normal  Intervals: normal  ST/T Wave abnormalities: normal  Conduction Disutrbances: none  Narrative Interpretation: unremarkable      ____________________________________________    RADIOLOGY  CT HEAD WO CONTRAST  Result Date: 07/01/2019 CLINICAL DATA:  Pt states her head hurts on top and her tongue feels tight. Tongue goes to the R. Pt states feels weak. State R sided sensation less. Pt states she woke up at 2am with HA. States was dizzy and sweaty before she went to bed last night. States has been feeling bad x 2 days. Pt also c/o nosebleed this AM EXAM: CT HEAD WITHOUT CONTRAST TECHNIQUE: Contiguous axial images were obtained from the base of the skull through the vertex without intravenous contrast. COMPARISON:  04/08/2019 FINDINGS: Brain: No evidence of acute infarction, hemorrhage, hydrocephalus, extra-axial collection or mass lesion/mass effect. Vascular: No hyperdense vessel or unexpected calcification. Skull: Normal. Negative for fracture or focal lesion. Sinuses/Orbits: Visualized globes and orbits are unremarkable. The visualized sinuses are clear. Other: None. IMPRESSION: Negative unenhanced CT scan of the brain, stable from the prior study. Electronically Signed   By: Lajean Manes M.D.   On: 07/01/2019 12:07   CT ABDOMEN PELVIS W CONTRAST  Result Date: 07/01/2019 CLINICAL DATA:  Pt arrives [facility]  from home for HA, pancreatitis, body pain, NANDV, sweating EXAM: CT ABDOMEN AND PELVIS WITH CONTRAST TECHNIQUE: Multidetector CT imaging of the abdomen and pelvis was performed using the standard protocol following bolus administration of intravenous contrast. CONTRAST:  71mL OMNIPAQUE IOHEXOL 300 MG/ML  SOLN COMPARISON:  06/13/2019 FINDINGS: Lower chest: No acute abnormality. Hepatobiliary: Liver normal in size and overall attenuation. Subtle 8 mm low-attenuation lesion at the dome of segment 7, unchanged, but nonspecific. No other liver mass or lesion. Normal gallbladder. No bile duct dilation. Pancreas: Probable pancreatic division as noted previously. No mass or inflammation. Spleen: Normal in size without focal abnormality. Adrenals/Urinary Tract: No adrenal masses. Kidneys normal in size, orientation and position with symmetric enhancement and excretion. Tiny stone in the upper pole the right kidney. No renal masses. No hydronephrosis. Normal ureters. Bladder wall is mildly thickened.  No bladder mass or stone. Stomach/Bowel: Normal stomach. Small bowel and colon are normal in caliber. No wall thickening. No inflammation. Scattered colonic diverticula. Status post appendectomy. Vascular/Lymphatic: Aortic atherosclerosis. No aneurysm. No enlarged lymph nodes. Reproductive: Status post hysterectomy. No adnexal masses. Other: No abdominal wall hernia or abnormality. No abdominopelvic ascites. Musculoskeletal: No fracture or acute finding. No osteoblastic or osteolytic lesions. 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 IMPRESSION: 1. Mild bladder wall thickening. Consider cystitis if there are consistent clinical findings. 2. No other evidence of an acute abnormality within the abdomen or pelvis. No evidence of pancreatitis. 3. Chronic changes as detailed, including aortic atherosclerosis, stable compared to the previous CT. Electronically Signed   By: Lajean Manes M.D.   On: 07/01/2019 13:44    DG Chest Portable 1 View  Result Date: 07/01/2019 CLINICAL DATA:  Generalized weakness, chills, headache, body pain, nausea and vomiting, diaphoresis, pancreatitis; history asthma, diabetes mellitus, hypertension EXAM: PORTABLE CHEST 1 VIEW COMPARISON:  Portable exam 1053 hours compared to 04/08/2019 FINDINGS: Normal heart  size, mediastinal contours, and pulmonary vascularity. Bronchitic changes without infiltrate, no pleural effusion or pneumothorax. Surgical clips RIGHT cervical region. No acute osseous findings. IMPRESSION: Bronchitic changes without infiltrate. Electronically Signed   By: Lavonia Dana M.D.   On: 07/01/2019 11:04    ____________________________________________   PROCEDURES Procedures  ____________________________________________  DIFFERENTIAL DIAGNOSIS   Viral syndrome, dehydration, electrolyte abnormality, hyperglycemia, hepatic encephalopathy/liver failure, ischemic stroke  CLINICAL IMPRESSION / ASSESSMENT AND PLAN / ED COURSE  Medications ordered in the ED: Medications  sodium chloride 0.9 % bolus 500 mL (0 mLs Intravenous Stopped 07/01/19 1356)  ondansetron (ZOFRAN) injection 4 mg (4 mg Intravenous Given 07/01/19 1040)  iohexol (OMNIPAQUE) 300 MG/ML solution 60 mL (60 mLs Intravenous Contrast Given 07/01/19 1327)  morphine 4 MG/ML injection 4 mg (4 mg Intravenous Given 07/01/19 1423)  diphenhydrAMINE (BENADRYL) injection 25 mg (25 mg Intravenous Given 07/01/19 1423)    Pertinent labs & imaging results that were available during my care of the patient were reviewed by me and considered in my medical decision making (see chart for details).  CADEN FATICA was evaluated in Emergency Department on 07/01/2019 for the symptoms described in the history of present illness. She was evaluated in the context of the global COVID-19 pandemic, which necessitated consideration that the patient might be at risk for infection with the SARS-CoV-2 virus that causes COVID-19.  Institutional protocols and algorithms that pertain to the evaluation of patients at risk for COVID-19 are in a state of rapid change based on information released by regulatory bodies including the CDC and federal and state organizations. These policies and algorithms were followed during the patient's care in the ED.   Patient presents with multitude of symptoms suggestive of a viral illness.  Does not meet code stroke criteria, would not be a candidate for TPA.  Stroke scale 1 for possible dysarthria.  Will obtain CT scan of the head, labs, chest x-ray, Covid test.  Clinical Course as of Jul 01 1451  Sat Jul 01, 2019  1036 Bed rails up for safety / due to weakness. Callbell placed within reach.    [PS]    Clinical Course User Index [PS] Carrie Mew, MD     ----------------------------------------- 2:53 PM on 07/01/2019 -----------------------------------------  Vital signs remain normal in the ED.  Labs are reassuring.  CT scan of the head unremarkable, chest x-ray unremarkable, CT scan of the abdomen and pelvis also unremarkable without signs of pancreatitis or acute issues.  I suspect her constellation of symptoms is due to a viral syndrome.  She is Covid negative.  Have her take Zofran and Pepcid, focus on hydration, follow-up with primary care in the next 2 days.  No evidence of acute liver failure or other coagulopathy, she is not septic, no acute bacterial illness.  ____________________________________________   FINAL CLINICAL IMPRESSION(S) / ED DIAGNOSES    Final diagnoses:  Generalized weakness  Dehydration     ED Discharge Orders         Ordered    famotidine (PEPCID) 20 MG tablet  2 times daily     Discontinue  Reprint     07/01/19 1452    ondansetron (ZOFRAN ODT) 4 MG disintegrating tablet  Every 8 hours PRN     Discontinue  Reprint     07/01/19 1452          Portions of this [name] were generated with dragon dictation software. Dictation errors may occur  despite best attempts at proofreading.   Joni Fears,  Doren Custard, MD 07/01/19 1453

## 2019-08-01 ENCOUNTER — Emergency Department
Admission: EM | Admit: 2019-08-01 | Discharge: 2019-08-01 | Disposition: A | Discharge status: Home / Self Care | Payer: Medicaid Other | Attending: Emergency Medicine | Admitting: Emergency Medicine

## 2019-08-01 ENCOUNTER — Encounter: Payer: Self-pay | Admitting: Emergency Medicine

## 2019-08-01 ENCOUNTER — Emergency Department: Payer: Medicaid Other

## 2019-08-01 ENCOUNTER — Other Ambulatory Visit: Payer: Self-pay

## 2019-08-01 DIAGNOSIS — E1165 Type 2 diabetes mellitus with hyperglycemia: Secondary | ICD-10-CM | POA: Insufficient documentation

## 2019-08-01 DIAGNOSIS — I129 Hypertensive chronic kidney disease with stage 1 through stage 4 chronic kidney disease, or unspecified chronic kidney disease: Secondary | ICD-10-CM | POA: Insufficient documentation

## 2019-08-01 DIAGNOSIS — J45909 Unspecified asthma, uncomplicated: Secondary | ICD-10-CM | POA: Insufficient documentation

## 2019-08-01 DIAGNOSIS — Z794 Long term (current) use of insulin: Secondary | ICD-10-CM | POA: Diagnosis not present

## 2019-08-01 DIAGNOSIS — R748 Abnormal levels of other serum enzymes: Secondary | ICD-10-CM

## 2019-08-01 DIAGNOSIS — E1142 Type 2 diabetes mellitus with diabetic polyneuropathy: Secondary | ICD-10-CM | POA: Insufficient documentation

## 2019-08-01 DIAGNOSIS — E1110 Type 2 diabetes mellitus with ketoacidosis without coma: Secondary | ICD-10-CM | POA: Insufficient documentation

## 2019-08-01 DIAGNOSIS — Z8719 Personal history of other diseases of the digestive system: Secondary | ICD-10-CM

## 2019-08-01 DIAGNOSIS — K860 Alcohol-induced chronic pancreatitis: Secondary | ICD-10-CM | POA: Diagnosis not present

## 2019-08-01 DIAGNOSIS — R1084 Generalized abdominal pain: Secondary | ICD-10-CM

## 2019-08-01 DIAGNOSIS — R109 Unspecified abdominal pain: Secondary | ICD-10-CM | POA: Diagnosis present

## 2019-08-01 DIAGNOSIS — Z79899 Other long term (current) drug therapy: Secondary | ICD-10-CM | POA: Insufficient documentation

## 2019-08-01 DIAGNOSIS — N1831 Chronic kidney disease, stage 3a: Secondary | ICD-10-CM | POA: Insufficient documentation

## 2019-08-01 DIAGNOSIS — Z87891 Personal history of nicotine dependence: Secondary | ICD-10-CM | POA: Insufficient documentation

## 2019-08-01 LAB — CBC WITH DIFFERENTIAL/PLATELET
Abs Immature Granulocytes: 0.09 10*3/uL — ABNORMAL HIGH (ref 0.00–0.07)
Basophils Absolute: 0.1 10*3/uL (ref 0.0–0.1)
Basophils Relative: 1 %
Eosinophils Absolute: 0.2 10*3/uL (ref 0.0–0.5)
Eosinophils Relative: 2 %
HCT: 35.2 % — ABNORMAL LOW (ref 36.0–46.0)
Hemoglobin: 12 g/dL (ref 12.0–15.0)
Immature Granulocytes: 1 %
Lymphocytes Relative: 27 %
Lymphs Abs: 2.6 10*3/uL (ref 0.7–4.0)
MCH: 28.7 pg (ref 26.0–34.0)
MCHC: 34.1 g/dL (ref 30.0–36.0)
MCV: 84.2 fL (ref 80.0–100.0)
Monocytes Absolute: 0.7 10*3/uL (ref 0.1–1.0)
Monocytes Relative: 7 %
Neutro Abs: 6.1 10*3/uL (ref 1.7–7.7)
Neutrophils Relative %: 62 %
Platelets: 241 10*3/uL (ref 150–400)
RBC: 4.18 MIL/uL (ref 3.87–5.11)
RDW: 11.6 % (ref 11.5–15.5)
WBC: 9.7 10*3/uL (ref 4.0–10.5)
nRBC: 0 % (ref 0.0–0.2)

## 2019-08-01 LAB — COMPREHENSIVE METABOLIC PANEL
ALT: 50 U/L — ABNORMAL HIGH (ref 0–44)
AST: 36 U/L (ref 15–41)
Albumin: 3.8 g/dL (ref 3.5–5.0)
Alkaline Phosphatase: 280 U/L — ABNORMAL HIGH (ref 38–126)
Anion gap: 11 (ref 5–15)
BUN: 28 mg/dL — ABNORMAL HIGH (ref 8–23)
CO2: 25 mmol/L (ref 22–32)
Calcium: 9.2 mg/dL (ref 8.9–10.3)
Chloride: 103 mmol/L (ref 98–111)
Creatinine, Ser: 1.35 mg/dL — ABNORMAL HIGH (ref 0.44–1.00)
GFR calc Af Amer: 49 mL/min — ABNORMAL LOW (ref >60)
GFR calc non Af Amer: 42 mL/min — ABNORMAL LOW (ref >60)
Glucose, Bld: 244 mg/dL — ABNORMAL HIGH (ref 70–99)
Potassium: 4.6 mmol/L (ref 3.5–5.1)
Sodium: 139 mmol/L (ref 135–145)
Total Bilirubin: 0.6 mg/dL (ref 0.3–1.2)
Total Protein: 7.6 g/dL (ref 6.5–8.1)

## 2019-08-01 LAB — URINALYSIS, COMPLETE (UACMP) WITH MICROSCOPIC
Bilirubin Urine: NEGATIVE
Glucose, UA: >=500 mg/dL — AB
Hgb urine dipstick: NEGATIVE
Ketones, ur: NEGATIVE mg/dL
Nitrite: NEGATIVE
Protein, ur: 100 mg/dL — AB
Specific Gravity, Urine: 1.005 (ref 1.005–1.030)
pH: 7 (ref 5.0–8.0)

## 2019-08-01 LAB — TROPONIN I (HIGH SENSITIVITY): Troponin I (High Sensitivity): 5 ng/L (ref <18)

## 2019-08-01 LAB — LIPASE, BLOOD: Lipase: 66 U/L — ABNORMAL HIGH (ref 11–51)

## 2019-08-01 MED ORDER — ONDANSETRON 4 MG PO TBDP
4.0000 mg | ORAL_TABLET | Freq: Three times a day (TID) | ORAL | 0 refills | Status: DC | PRN
Start: 2019-08-01 — End: 2019-08-02

## 2019-08-01 MED ORDER — SODIUM CHLORIDE 0.9 % IV BOLUS
1000.0000 mL | Freq: Once | INTRAVENOUS | Status: AC
  Administered 2019-08-01: 1000 mL via INTRAVENOUS

## 2019-08-01 MED ORDER — HYDROCODONE-ACETAMINOPHEN 5-325 MG PO TABS: 1.0000 | ORAL_TABLET | Freq: Four times a day (QID) | ORAL | 0 refills | Status: DC | PRN

## 2019-08-01 MED ORDER — ONDANSETRON HCL 4 MG/2ML IJ SOLN
4.0000 mg | Freq: Once | INTRAMUSCULAR | Status: AC
  Administered 2019-08-01: 4 mg via INTRAVENOUS
  Filled 2019-08-01: qty 2

## 2019-08-01 MED ORDER — MORPHINE SULFATE (PF) 4 MG/ML IV SOLN
6.0000 mg | Freq: Once | INTRAVENOUS | Status: AC
  Administered 2019-08-01: 6 mg via INTRAVENOUS
  Filled 2019-08-01: qty 2

## 2019-08-01 NOTE — ED Notes (Signed)
Pt given crackers and PB and water in order to have PO challenge per Dr. Cinda Quest.

## 2019-08-01 NOTE — Discharge Instructions (Addendum)
Use the Zofran melt on your tongue wafers 1 wafer 3 times a day for nausea.  Today sip clear fluids in small amounts frequently.  Tomorrow try a low-fat diet again in small amounts frequently as explained in the pancreatitis eating plan below.  Return for worsening pain fever or vomiting.  Use the Vicodin 1 pill 4 times a day as needed for pain.  Follow-up with your primary care doctor in the next few days.

## 2019-08-01 NOTE — ED Provider Notes (Signed)
Pharmacy calls.  She had got a prescription for Vicodin on on the seventh.  Same number of pills as this 1.  She really would not be due until 4 August or thereabouts.  It is possible that she used more than 1 pill 4 times a day.  This would not be uncommon in somebody with bad panic attack pain.  However she could also be drug-seeking she does have a history of alcoholism.  I will let her have the prescription today.  But I am putting this note in the computer to call attention to the fact that she has gotten this prescription early and that the pharmacy says that she has done this in the past as well.   Nena Polio, MD 08/01/19 [phone]

## 2019-08-01 NOTE — ED Triage Notes (Signed)
Patient ambulatory to triage with steady gait, without difficulty or distress noted; pt reports generalized abd pain since yesterday accomp by N/V and constipation; st hx pancreatitis

## 2019-08-01 NOTE — ED Provider Notes (Signed)
[facility] Emergency Department Provider Note   ____________________________________________   First MD Initiated Contact with Patient 08/01/19 1217     (approximate)  I have reviewed the triage vital signs and the nursing notes.   HISTORY  Chief Complaint Abdominal Pain    HPI Karen [name] is a 64 y.o. female patient with a history of pancreatitis and alcohol abuse.  Patient's pancreatitis pain started up again yesterday morning with nausea and vomiting.  No history of diarrhea.  She complains of some constipation to the nurse but not to me.  She says eating makes her feel like she is nauseated.         Past Medical History:  Diagnosis Date  . Alcoholic pancreatitis   . Asthma   . Depression   . Diabetes mellitus without complication ([place])   . DKA (diabetic ketoacidoses) ([place]) 02/17/2015  . Emphysematous cystitis   . Hypercholesteremia   . Hypertension   . Hypokalemia     Patient Active Problem List   Diagnosis Date Noted  . Itching   . Diabetic polyneuropathy associated with type 2 diabetes mellitus ([place])   . Leukocytosis 06/13/2019  . Chronic kidney disease (CKD), stage II (mild)   . CKD (chronic kidney disease), stage IIIa 03/15/2019  . HTN (hypertension) 03/15/2019  . Type II diabetes mellitus with renal manifestations ([place]) 03/15/2019  . Headache 02/26/2019  . Dizziness 02/26/2019  . Hyperglycemia due to type 2 diabetes mellitus ([place]) 01/30/2019  . Acute pancreatitis 01/30/2019  . Constipation   . Acute on chronic pancreatitis ([place]) 01/25/2019  . Acute pain of left shoulder   . Adhesive capsulitis of left shoulder   . Abnormal LFTs 01/24/2019  . Cough 01/24/2019  . Hypertension   . HLD (hyperlipidemia)   . Type 2 diabetes mellitus with hyperlipidemia ([place])   . GERD (gastroesophageal reflux disease)   . Acute kidney injury superimposed on CKD ([place])   . Pancreatitis, recurrent   . Chronic pancreatitis ([place]) 09/25/2018  .  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 Coffee ground emesis   . Upper GI bleed 11/11/2017  . Pancreatitis, acute 01/03/2017  . ETOH abuse 01/03/2017  . UTI (urinary tract infection) 01/03/2017  . Nausea and vomiting   . Esophageal candidiasis ([place])   . Intractable vomiting with nausea   . Hypertensive urgency 04/21/2016  . Abdominal pain   . Nausea vomiting and diarrhea   . Smoker   . Poorly controlled type 2 diabetes mellitus ([place])   . Acute renal insufficiency 10/29/2015  . Elevated troponin 10/29/2015  . Urinary tract infection without hematuria 10/29/2015  . Left flank pain 10/29/2015  . Malignant essential hypertension 10/29/2015  . DKA (diabetic ketoacidoses) ([place]) 02/17/2015  . Emphysematous cystitis     Past Surgical History:  Procedure Laterality Date  . ABDOMINAL HYSTERECTOMY  1996  . APPENDECTOMY  1997  . ESOPHAGOGASTRODUODENOSCOPY N/A 11/12/2017   Procedure: ESOPHAGOGASTRODUODENOSCOPY (EGD);  Surgeon: Lin Landsman, MD;  Location: [facility] ENDOSCOPY;  Service: Gastroenterology;  Laterality: N/A;  . ESOPHAGOGASTRODUODENOSCOPY N/A 09/29/2018   Procedure: ESOPHAGOGASTRODUODENOSCOPY (EGD);  Surgeon: Toledo, Benay Pike, MD;  Location: [facility] ENDOSCOPY;  Service: Gastroenterology;  Laterality: N/A;  . ESOPHAGOGASTRODUODENOSCOPY (EGD) WITH PROPOFOL N/A 04/24/2016   Procedure: ESOPHAGOGASTRODUODENOSCOPY (EGD) WITH PROPOFOL;  Surgeon: [name], MD;  Location: [facility] ENDOSCOPY;  Service: Endoscopy;  Laterality: N/A;  . ESOPHAGOGASTRODUODENOSCOPY (EGD) WITH PROPOFOL N/A 01/28/2019   Procedure: ESOPHAGOGASTRODUODENOSCOPY (EGD) WITH PROPOFOL;  Surgeon: Alice Reichert, Benay Pike, MD;  Location:  [facility] ENDOSCOPY;  Service: Gastroenterology;  Laterality: N/A;  . HAND SURGERY  1988  . THYROID SURGERY  2013    Prior to Admission medications   Medication Sig Start Date End Date Taking? Authorizing Provider  amLODipine (NORVASC) 10 MG tablet Take 10 mg by mouth daily.    [provider]   cyclobenzaprine (FLEXERIL) 5 MG tablet Take 5 mg by mouth in the morning and at bedtime.    [provider]  docusate sodium (COLACE) 100 MG capsule Take 100 mg by mouth 2 (two) times daily.    [provider]  famotidine (PEPCID) 20 MG tablet Take 1 tablet (20 mg total) by mouth 2 (two) times daily. 07/01/19   Carrie Mew, MD  HYDROcodone-acetaminophen (NORCO/VICODIN) 5-325 MG tablet One tablet every four hours as needed for three days then go back to you usual one tablet every six hours 06/18/19   Loletha Grayer, MD  HYDROcodone-acetaminophen (NORCO/VICODIN) 5-325 MG tablet Take 1 tablet by mouth every 6 (six) hours as needed for moderate pain. 08/01/19   Nena Polio, MD  insulin aspart (NOVOLOG) 100 UNIT/ML FlexPen Inject 2 Units into the skin 3 (three) times daily with meals. 06/18/19   Loletha Grayer, MD  insulin glargine (LANTUS) 100 unit/mL SOPN Inject 0.2 mLs (20 Units total) into the skin at bedtime. 06/18/19   Wieting, Richard, MD  Insulin Pen Needle 29G X 12MM MISC 1 Dose by Does not apply route 3 (three) times daily before meals. 06/18/19   Loletha Grayer, MD  lipase/protease/amylase (CREON) 36000 UNITS CPEP capsule Take 2 capsules (72,000 Units total) by mouth 3 (three) times daily with meals. 01/29/19   Loletha Grayer, MD  omeprazole (PRILOSEC) 40 MG capsule Take 40 mg by mouth in the morning and at bedtime.    [provider]  ondansetron (ZOFRAN ODT) 4 MG disintegrating tablet Take 1 tablet (4 mg total) by mouth every 8 (eight) hours as needed for nausea or vomiting. 07/01/19   Carrie Mew, MD  ondansetron (ZOFRAN ODT) 4 MG disintegrating tablet Take 1 tablet (4 mg total) by mouth every 8 (eight) hours as needed for nausea or vomiting. 08/01/19   Nena Polio, MD  ondansetron (ZOFRAN-ODT) 4 MG disintegrating tablet Take 4 mg by mouth every 8 (eight) hours as needed for nausea or vomiting.    [provider]  Polyethylene Glycol 3350  POWD in the morning and at bedtime. Mix 17 grams in 4-8 oz of liquid and drink    [provider]  pregabalin (LYRICA) 200 MG capsule Take 200 mg by mouth 2 (two) times daily.     [provider]  QUEtiapine (SEROQUEL) 25 MG tablet Take 25 mg by mouth at bedtime.    [provider]  rosuvastatin (CRESTOR) 20 MG tablet Take 20 mg by mouth daily.    [provider]  simethicone (MYLICON) 80 MG chewable tablet Chew 1 tablet (80 mg total) by mouth 4 (four) times daily. 06/18/19   Loletha Grayer, MD  triamcinolone cream (KENALOG) 0.5 % Apply twice a day to back of thighs 06/18/19   Loletha Grayer, MD  GLUCERNA [facility]) LIQD Take 237 mLs by mouth 3 (three) times daily between meals. 02/19/15 02/27/15  Bettey Costa, MD    Allergies Penicillins, Reglan [metoclopramide], Fentanyl, and Percocet [oxycodone-acetaminophen]  Family History  Family history unknown: Yes    Social History Social History   Tobacco Use  . Smoking status: Former Research scientist (life sciences)  . Smokeless tobacco: Never Used  .  Tobacco comment: quit 7 years ago   Vaping Use  . Vaping Use: Some days  Substance Use Topics  . Alcohol use: Not Currently    Comment: quit drinking 2 years ago   . Drug use: No    Review of Systems  Constitutional: No fever/chills Eyes: No visual changes. ENT: No sore throat. Cardiovascular: Denies chest pain. Respiratory: Denies shortness of breath. Gastrointestinal:  abdominal pain.   nausea, currently no vomiting.  No diarrhea. constipation. Genitourinary: Negative for dysuria. Musculoskeletal: Negative for back pain. Skin: Negative for rash. Neurological: Negative for headaches, focal weakness   ____________________________________________   PHYSICAL EXAM:  VITAL SIGNS: ED Triage Vitals  Enc Vitals Group     BP 08/01/19 0608 (!) 164/77     Pulse Rate 08/01/19 0608 78     Resp 08/01/19 0608 20     Temp 08/01/19 0608 98.6 F (37 C)     Temp Source 08/01/19  0608 Oral     SpO2 08/01/19 0608 97 %     Weight 08/01/19 0609 120 lb (54.4 kg)     Height 08/01/19 0609 5\' 1"  (1.549 m)     Head Circumference --      Peak Flow --      Pain Score 08/01/19 0624 8     Pain Loc --      Pain Edu? --      Excl. in [place]? --     Constitutional: Alert and oriented.  Looks somewhat uncomfortable Eyes: Conjunctivae are normal. Head: Atraumatic. Nose: No congestion/rhinnorhea. Mouth/Throat: Mucous membranes are moist.  Oropharynx non-erythematous. Neck: No stridor. Cardiovascular: Normal rate, regular rhythm. Grossly normal heart sounds.  Good peripheral circulation. Respiratory: Normal respiratory effort.  No retractions. Lungs CTAB. Gastrointestinal: Soft some tenderness to palpation but not percussion in the upper abdomen especially epigastric area no distention. No abdominal bruits.  Musculoskeletal: No lower extremity tenderness nor edema.  ons. Neurologic:  Normal speech and language. No gross focal neurologic deficits are appreciated. No gait instability. Skin:  Skin is warm, dry and intact. No rash noted.   ____________________________________________   LABS (all labs ordered are listed, but only abnormal results are displayed)  Labs Reviewed  CBC WITH DIFFERENTIAL/PLATELET - Abnormal; Notable for the following components:      Result Value   HCT 35.2 (*)    Abs Immature Granulocytes 0.09 (*)    All other components within normal limits  COMPREHENSIVE METABOLIC PANEL - Abnormal; Notable for the following components:   Glucose, Bld 244 (*)    BUN 28 (*)    Creatinine, Ser 1.35 (*)    ALT 50 (*)    Alkaline Phosphatase 280 (*)    GFR calc non Af Amer 42 (*)    GFR calc Af Amer 49 (*)    All other components within normal limits  LIPASE, BLOOD - Abnormal; Notable for the following components:   Lipase 66 (*)    All other components within normal limits  URINALYSIS, COMPLETE (UACMP) WITH MICROSCOPIC - Abnormal; Notable for the following  components:   Color, Urine STRAW (*)    APPearance CLEAR (*)    Glucose, UA >=500 (*)    Protein, ur 100 (*)    Leukocytes,Ua TRACE (*)    Bacteria, UA RARE (*)    All other components within normal limits  TROPONIN I (HIGH SENSITIVITY)   ____________________________________________  EKG  EKG read interpreted by me shows normal sinus rhythm rate is 73 normal axis essentially  normal EKG.  V1 and V2 may be reversed. ____________________________________________  RADIOLOGY  ED MD interpretation:    Official radiology report(s): US Abdomen Complete  Result Date: 08/01/2019 CLINICAL DATA:  Upper abdominal pain. Elevated alkaline phosphatase. History of pancreatitis. EXAM: ABDOMEN ULTRASOUND COMPLETE COMPARISON:  CT abdomen and pelvis 07/01/2019. Right upper quadrant abdominal ultrasound 03/15/2019. FINDINGS: Gallbladder: No gallstones or wall thickening visualized. No sonographic Murphy sign noted by sonographer. Common bile duct: Diameter: Mildly dilated at 9 mm (10 mm on the 03/15/2019 ultrasound) Liver: Mildly heterogeneous parenchymal echogenicity diffusely, nonspecific. 12 mm hyperechoic lesion in the posterior right hepatic lobe, unchanged from the prior ultrasound. Portal vein is patent on color Doppler imaging with normal direction of blood flow towards the liver. IVC: No abnormality visualized. Pancreas: Visualized portion unremarkable. Spleen: Size and appearance within normal limits. Right Kidney: Length: 9.8 cm. Echogenicity within normal limits. No mass or hydronephrosis visualized. Left Kidney: Length: 10.6 cm. Echogenicity within normal limits. No mass or hydronephrosis visualized. Abdominal aorta: Atherosclerosis. Distal abdominal aorta obscured by bowel gas. No aneurysm visualized in the more proximal abdominal aorta. Other findings: None. IMPRESSION: 1. No acute finding. 2. Mild common bile duct dilatation, stable to slightly less than on 03/15/2019. 3. Unchanged 12 mm hyperechoic  liver lesion, possibly a hemangioma. Electronically Signed   By: Logan Bores M.D.   On: 08/01/2019 13:59    ____________________________________________   PROCEDURES  Procedure(s) performed (including Critical Care):  Procedures   ____________________________________________   INITIAL IMPRESSION / ASSESSMENT AND PLAN / ED COURSE  Patient's lipase is mildly elevated.  This is likely either mild acute pancreatitis or mild chronic pancreatitis.  Patient is doing well after Zofran and pain meds.  She says she takes Vicodin at home and review the old records she bears this out.  She is able to tolerate p.o. here in the emergency room.  We will let her go with some Vicodin and Zofran.  Ultrasound does not show any other intra-abdominal problems patient's lab work is stable and her white count is good.  I do not believe she has on any any other intra-abdominal problems like gallbladder disease or hepatitis.  The pain is just like her usual attacks of pancreatitis.  Stomach problems like gastritis or ulcer very unlikely in this patient at this time as well.              ____________________________________________   FINAL CLINICAL IMPRESSION(S) / ED DIAGNOSES  Final diagnoses:  Abdominal pain, unspecified abdominal location  Alcohol-induced chronic pancreatitis [facility])     ED Discharge Orders         Ordered    ondansetron (ZOFRAN ODT) 4 MG disintegrating tablet  Every 8 hours PRN     Discontinue  Reprint     08/01/19 1516    HYDROcodone-acetaminophen (NORCO/VICODIN) 5-325 MG tablet  Every 6 hours PRN     Discontinue  Reprint     08/01/19 1516           Note:  This document was prepared using Dragon voice recognition software and may include unintentional dictation errors.    Nena Polio, MD 08/01/19 [phone]

## 2019-08-02 ENCOUNTER — Other Ambulatory Visit: Payer: Self-pay

## 2019-08-02 ENCOUNTER — Encounter: Payer: Self-pay | Admitting: Emergency Medicine

## 2019-08-02 ENCOUNTER — Inpatient Hospital Stay
Admission: EM | Admit: 2019-08-02 | Discharge: 2019-08-05 | DRG: 440 | Disposition: A | Discharge status: Home / Self Care | Payer: Medicaid Other | Attending: Internal Medicine | Admitting: Internal Medicine

## 2019-08-02 DIAGNOSIS — J45909 Unspecified asthma, uncomplicated: Secondary | ICD-10-CM | POA: Diagnosis present

## 2019-08-02 DIAGNOSIS — N3080 Other cystitis without hematuria: Secondary | ICD-10-CM | POA: Diagnosis present

## 2019-08-02 DIAGNOSIS — E1122 Type 2 diabetes mellitus with diabetic chronic kidney disease: Secondary | ICD-10-CM | POA: Diagnosis present

## 2019-08-02 DIAGNOSIS — K860 Alcohol-induced chronic pancreatitis: Secondary | ICD-10-CM | POA: Diagnosis present

## 2019-08-02 DIAGNOSIS — R109 Unspecified abdominal pain: Secondary | ICD-10-CM | POA: Diagnosis not present

## 2019-08-02 DIAGNOSIS — K861 Other chronic pancreatitis: Secondary | ICD-10-CM

## 2019-08-02 DIAGNOSIS — F329 Major depressive disorder, single episode, unspecified: Secondary | ICD-10-CM | POA: Diagnosis present

## 2019-08-02 DIAGNOSIS — G8929 Other chronic pain: Secondary | ICD-10-CM | POA: Diagnosis present

## 2019-08-02 DIAGNOSIS — N183 Chronic kidney disease, stage 3 unspecified: Secondary | ICD-10-CM | POA: Diagnosis present

## 2019-08-02 DIAGNOSIS — K852 Alcohol induced acute pancreatitis without necrosis or infection: Principal | ICD-10-CM | POA: Diagnosis present

## 2019-08-02 DIAGNOSIS — E1142 Type 2 diabetes mellitus with diabetic polyneuropathy: Secondary | ICD-10-CM | POA: Diagnosis present

## 2019-08-02 DIAGNOSIS — Z880 Allergy status to penicillin: Secondary | ICD-10-CM

## 2019-08-02 DIAGNOSIS — Z886 Allergy status to analgesic agent status: Secondary | ICD-10-CM

## 2019-08-02 DIAGNOSIS — Z87891 Personal history of nicotine dependence: Secondary | ICD-10-CM

## 2019-08-02 DIAGNOSIS — N1832 Chronic kidney disease, stage 3b: Secondary | ICD-10-CM | POA: Diagnosis present

## 2019-08-02 DIAGNOSIS — I129 Hypertensive chronic kidney disease with stage 1 through stage 4 chronic kidney disease, or unspecified chronic kidney disease: Secondary | ICD-10-CM | POA: Diagnosis present

## 2019-08-02 DIAGNOSIS — N1831 Chronic kidney disease, stage 3a: Secondary | ICD-10-CM

## 2019-08-02 DIAGNOSIS — E1165 Type 2 diabetes mellitus with hyperglycemia: Secondary | ICD-10-CM | POA: Diagnosis present

## 2019-08-02 DIAGNOSIS — F1011 Alcohol abuse, in remission: Secondary | ICD-10-CM | POA: Diagnosis present

## 2019-08-02 DIAGNOSIS — I10 Essential (primary) hypertension: Secondary | ICD-10-CM | POA: Diagnosis present

## 2019-08-02 DIAGNOSIS — Z888 Allergy status to other drugs, medicaments and biological substances status: Secondary | ICD-10-CM

## 2019-08-02 DIAGNOSIS — K219 Gastro-esophageal reflux disease without esophagitis: Secondary | ICD-10-CM | POA: Diagnosis not present

## 2019-08-02 DIAGNOSIS — E785 Hyperlipidemia, unspecified: Secondary | ICD-10-CM | POA: Diagnosis present

## 2019-08-02 DIAGNOSIS — Z20822 Contact with and (suspected) exposure to covid-19: Secondary | ICD-10-CM | POA: Diagnosis present

## 2019-08-02 DIAGNOSIS — Z794 Long term (current) use of insulin: Secondary | ICD-10-CM

## 2019-08-02 DIAGNOSIS — K859 Acute pancreatitis without necrosis or infection, unspecified: Secondary | ICD-10-CM | POA: Diagnosis present

## 2019-08-02 DIAGNOSIS — K838 Other specified diseases of biliary tract: Secondary | ICD-10-CM | POA: Diagnosis present

## 2019-08-02 DIAGNOSIS — Z79899 Other long term (current) drug therapy: Secondary | ICD-10-CM

## 2019-08-02 DIAGNOSIS — Z79891 Long term (current) use of opiate analgesic: Secondary | ICD-10-CM

## 2019-08-02 DIAGNOSIS — E1129 Type 2 diabetes mellitus with other diabetic kidney complication: Secondary | ICD-10-CM | POA: Diagnosis present

## 2019-08-02 LAB — COMPREHENSIVE METABOLIC PANEL
ALT: 40 U/L (ref 0–44)
AST: 28 U/L (ref 15–41)
Albumin: 3.8 g/dL (ref 3.5–5.0)
Alkaline Phosphatase: 233 U/L — ABNORMAL HIGH (ref 38–126)
Anion gap: 9 (ref 5–15)
BUN: 25 mg/dL — ABNORMAL HIGH (ref 8–23)
CO2: 27 mmol/L (ref 22–32)
Calcium: 8.9 mg/dL (ref 8.9–10.3)
Chloride: 102 mmol/L (ref 98–111)
Creatinine, Ser: 1.47 mg/dL — ABNORMAL HIGH (ref 0.44–1.00)
GFR calc Af Amer: 44 mL/min — ABNORMAL LOW (ref >60)
GFR calc non Af Amer: 38 mL/min — ABNORMAL LOW (ref >60)
Glucose, Bld: 345 mg/dL — ABNORMAL HIGH (ref 70–99)
Potassium: 4.8 mmol/L (ref 3.5–5.1)
Sodium: 138 mmol/L (ref 135–145)
Total Bilirubin: 0.6 mg/dL (ref 0.3–1.2)
Total Protein: 7.7 g/dL (ref 6.5–8.1)

## 2019-08-02 LAB — CBC
HCT: 35.3 % — ABNORMAL LOW (ref 36.0–46.0)
Hemoglobin: 11.7 g/dL — ABNORMAL LOW (ref 12.0–15.0)
MCH: 28.4 pg (ref 26.0–34.0)
MCHC: 33.1 g/dL (ref 30.0–36.0)
MCV: 85.7 fL (ref 80.0–100.0)
Platelets: 244 10*3/uL (ref 150–400)
RBC: 4.12 MIL/uL (ref 3.87–5.11)
RDW: 11.9 % (ref 11.5–15.5)
WBC: 7.1 10*3/uL (ref 4.0–10.5)
nRBC: 0 % (ref 0.0–0.2)

## 2019-08-02 LAB — URINALYSIS, COMPLETE (UACMP) WITH MICROSCOPIC
Bilirubin Urine: NEGATIVE
Glucose, UA: 50 mg/dL — AB
Hgb urine dipstick: NEGATIVE
Ketones, ur: NEGATIVE mg/dL
Nitrite: NEGATIVE
Protein, ur: 100 mg/dL — AB
Specific Gravity, Urine: 1.006 (ref 1.005–1.030)
WBC, UA: >50 WBC/hpf — ABNORMAL HIGH (ref 0–5)
pH: 7 (ref 5.0–8.0)

## 2019-08-02 LAB — GLUCOSE, CAPILLARY
Glucose-Capillary: 103 mg/dL — ABNORMAL HIGH (ref 70–99)
Glucose-Capillary: 96 mg/dL (ref 70–99)

## 2019-08-02 LAB — SARS CORONAVIRUS 2 BY RT PCR (HOSPITAL ORDER, PERFORMED IN ~~LOC~~ HOSPITAL LAB): SARS Coronavirus 2: NEGATIVE

## 2019-08-02 LAB — LIPASE, BLOOD: Lipase: 64 U/L — ABNORMAL HIGH (ref 11–51)

## 2019-08-02 MED ORDER — QUETIAPINE FUMARATE 25 MG PO TABS: 25.0000 mg | ORAL_TABLET | ORAL | Status: DC | PRN

## 2019-08-02 MED ORDER — INSULIN GLARGINE 100 UNIT/ML ~~LOC~~ SOLN
15.0000 [IU] | Freq: Every day | SUBCUTANEOUS | Status: DC
  Administered 2019-08-03 – 2019-08-04 (×2): 15 [IU] via SUBCUTANEOUS
  Filled 2019-08-02 (×4): qty 0.15

## 2019-08-02 MED ORDER — FAMOTIDINE 20 MG PO TABS
10.0000 mg | ORAL_TABLET | Freq: Two times a day (BID) | ORAL | Status: DC
  Administered 2019-08-03 – 2019-08-05 (×5): 10 mg via ORAL
  Filled 2019-08-02 (×5): qty 1

## 2019-08-02 MED ORDER — AMLODIPINE BESYLATE 10 MG PO TABS
10.0000 mg | ORAL_TABLET | Freq: Every day | ORAL | Status: DC
  Administered 2019-08-02 – 2019-08-05 (×4): 10 mg via ORAL
  Filled 2019-08-02: qty 2
  Filled 2019-08-02 (×3): qty 1

## 2019-08-02 MED ORDER — PREGABALIN 75 MG PO CAPS
75.0000 mg | ORAL_CAPSULE | Freq: Two times a day (BID) | ORAL | Status: DC
  Administered 2019-08-03 – 2019-08-05 (×5): 75 mg via ORAL
  Filled 2019-08-02 (×5): qty 1

## 2019-08-02 MED ORDER — SODIUM CHLORIDE 0.9% FLUSH: 3.0000 mL | Freq: Once | INTRAVENOUS | Status: DC

## 2019-08-02 MED ORDER — FAMOTIDINE 20 MG PO TABS
20.0000 mg | ORAL_TABLET | Freq: Two times a day (BID) | ORAL | Status: DC
  Administered 2019-08-02: 20 mg via ORAL
  Filled 2019-08-02: qty 1

## 2019-08-02 MED ORDER — SODIUM CHLORIDE 0.9 % IV SOLN: INTRAVENOUS | Status: DC

## 2019-08-02 MED ORDER — ROSUVASTATIN CALCIUM 20 MG PO TABS
40.0000 mg | ORAL_TABLET | Freq: Two times a day (BID) | ORAL | Status: DC
  Administered 2019-08-02 – 2019-08-05 (×6): 40 mg via ORAL
  Filled 2019-08-02 (×8): qty 2

## 2019-08-02 MED ORDER — HYDROCODONE-ACETAMINOPHEN 5-325 MG PO TABS
1.0000 | ORAL_TABLET | ORAL | Status: DC | PRN
  Administered 2019-08-02 – 2019-08-05 (×9): 1 via ORAL
  Filled 2019-08-02 (×9): qty 1

## 2019-08-02 MED ORDER — ONDANSETRON HCL 4 MG/2ML IJ SOLN
4.0000 mg | Freq: Once | INTRAMUSCULAR | Status: AC
  Administered 2019-08-02: 4 mg via INTRAVENOUS
  Filled 2019-08-02: qty 2

## 2019-08-02 MED ORDER — PANCRELIPASE (LIP-PROT-AMYL) 12000-38000 UNITS PO CPEP
72000.0000 [IU] | ORAL_CAPSULE | Freq: Three times a day (TID) | ORAL | Status: DC
  Administered 2019-08-03 – 2019-08-05 (×8): 72000 [IU] via ORAL
  Filled 2019-08-02 (×9): qty 6

## 2019-08-02 MED ORDER — ONDANSETRON HCL 4 MG/2ML IJ SOLN
4.0000 mg | Freq: Three times a day (TID) | INTRAMUSCULAR | Status: DC | PRN
  Administered 2019-08-02 – 2019-08-05 (×4): 4 mg via INTRAVENOUS
  Filled 2019-08-02 (×4): qty 2

## 2019-08-02 MED ORDER — ENOXAPARIN SODIUM 30 MG/0.3ML ~~LOC~~ SOLN
30.0000 mg | SUBCUTANEOUS | Status: DC
  Administered 2019-08-02 – 2019-08-04 (×3): 30 mg via SUBCUTANEOUS
  Filled 2019-08-02 (×4): qty 0.3

## 2019-08-02 MED ORDER — PREGABALIN 75 MG PO CAPS
200.0000 mg | ORAL_CAPSULE | Freq: Two times a day (BID) | ORAL | Status: DC
  Administered 2019-08-02: 75 mg via ORAL
  Filled 2019-08-02: qty 1

## 2019-08-02 MED ORDER — ZOLPIDEM TARTRATE 5 MG PO TABS
5.0000 mg | ORAL_TABLET | Freq: Every day | ORAL | Status: DC
  Administered 2019-08-03 – 2019-08-04 (×2): 5 mg via ORAL
  Filled 2019-08-02 (×2): qty 1

## 2019-08-02 MED ORDER — POLYETHYLENE GLYCOL 3350 17 G PO PACK
17.0000 g | PACK | Freq: Every day | ORAL | Status: DC
  Administered 2019-08-02: 17 g via ORAL
  Filled 2019-08-02 (×3): qty 1

## 2019-08-02 MED ORDER — HYDROMORPHONE HCL 1 MG/ML IJ SOLN
1.0000 mg | INTRAMUSCULAR | Status: DC | PRN
  Administered 2019-08-02 – 2019-08-03 (×2): 1 mg via INTRAVENOUS
  Filled 2019-08-02 (×2): qty 1

## 2019-08-02 MED ORDER — DOCUSATE SODIUM 100 MG PO CAPS
100.0000 mg | ORAL_CAPSULE | Freq: Two times a day (BID) | ORAL | Status: DC
  Administered 2019-08-02 – 2019-08-05 (×5): 100 mg via ORAL
  Filled 2019-08-02 (×6): qty 1

## 2019-08-02 MED ORDER — PANTOPRAZOLE SODIUM 40 MG PO TBEC
40.0000 mg | DELAYED_RELEASE_TABLET | Freq: Every day | ORAL | Status: DC
  Administered 2019-08-02 – 2019-08-05 (×4): 40 mg via ORAL
  Filled 2019-08-02 (×4): qty 1

## 2019-08-02 MED ORDER — CYCLOBENZAPRINE HCL 10 MG PO TABS
5.0000 mg | ORAL_TABLET | Freq: Two times a day (BID) | ORAL | Status: DC | PRN
  Administered 2019-08-04: 5 mg via ORAL
  Filled 2019-08-02: qty 1

## 2019-08-02 MED ORDER — HYDROMORPHONE HCL 1 MG/ML IJ SOLN
0.5000 mg | Freq: Once | INTRAMUSCULAR | Status: AC
  Administered 2019-08-02: 0.5 mg via INTRAVENOUS
  Filled 2019-08-02: qty 1

## 2019-08-02 MED ORDER — SODIUM CHLORIDE 0.9 % IV BOLUS
1000.0000 mL | Freq: Once | INTRAVENOUS | Status: AC
  Administered 2019-08-02: 1000 mL via INTRAVENOUS

## 2019-08-02 MED ORDER — ZOLPIDEM TARTRATE 5 MG PO TABS
10.0000 mg | ORAL_TABLET | Freq: Every day | ORAL | Status: DC
  Administered 2019-08-02: 5 mg via ORAL
  Filled 2019-08-02: qty 2

## 2019-08-02 NOTE — ED Notes (Signed)
Pt resting comfortably at this time, call bell within reach, stretcher locked in lowest position.  

## 2019-08-02 NOTE — ED Notes (Signed)
Attempted to call report to floor, RN to call back with questions 

## 2019-08-02 NOTE — ED Provider Notes (Signed)
[facility] Emergency Department Provider Note  ____________________________________________   First MD Initiated Contact with Patient 08/02/19 1140     (approximate)  I have reviewed the triage vital signs and the nursing notes.   HISTORY  Chief Complaint Abdominal Pain    HPI Karen [name] is a 64 y.o. female with past medical history of alcoholic pancreatitis, hypertension, hyperlipidemia, here with abdominal pain.  The patient states that since her recent ED evaluation, she has had recurrent and ongoing, increasingly severe aching, gnawing, epigastric abdominal pain.  She has had associated nausea with vomiting.  She has had difficulty tolerating p.o.  Denies any specific alleviating factors.  Symptoms feel similar to her prior episodes of pancreatitis.  No known fevers or chills.  No recent sick contacts.        Past Medical History:  Diagnosis Date  . Alcoholic pancreatitis   . Asthma   . Depression   . Diabetes mellitus without complication ([place])   . DKA (diabetic ketoacidoses) ([place]) 02/17/2015  . Emphysematous cystitis   . Hypercholesteremia   . Hypertension   . Hypokalemia     Patient Active Problem List   Diagnosis Date Noted  . Itching   . Diabetic polyneuropathy associated with type 2 diabetes mellitus ([place])   . Leukocytosis 06/13/2019  . Chronic kidney disease (CKD), stage II (mild)   . CKD (chronic kidney disease), stage IIIa 03/15/2019  . HTN (hypertension) 03/15/2019  . Type II diabetes mellitus with renal manifestations ([place]) 03/15/2019  . Headache 02/26/2019  . Dizziness 02/26/2019  . Hyperglycemia due to type 2 diabetes mellitus ([place]) 01/30/2019  . Acute pancreatitis 01/30/2019  . Constipation   . Acute on chronic pancreatitis ([place]) 01/25/2019  . Acute pain of left shoulder   . Adhesive capsulitis of left shoulder   . Abnormal LFTs 01/24/2019  . Cough 01/24/2019  . Hypertension   . HLD (hyperlipidemia)   . Type 2  diabetes mellitus with hyperlipidemia ([place])   . GERD (gastroesophageal reflux disease)   . Acute kidney injury superimposed on CKD ([place])   . Pancreatitis, recurrent   . Chronic pancreatitis ([place]) 09/25/2018  . 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 Coffee ground emesis   . Upper GI bleed 11/11/2017  . Pancreatitis, acute 01/03/2017  . ETOH abuse 01/03/2017  . UTI (urinary tract infection) 01/03/2017  . Nausea and vomiting   . Esophageal candidiasis ([place])   . Intractable vomiting with nausea   . Hypertensive urgency 04/21/2016  . Abdominal pain   . Nausea vomiting and diarrhea   . Smoker   . Poorly controlled type 2 diabetes mellitus ([place])   . Acute renal insufficiency 10/29/2015  . Elevated troponin 10/29/2015  . Urinary tract infection without hematuria 10/29/2015  . Left flank pain 10/29/2015  . Malignant essential hypertension 10/29/2015  . DKA (diabetic ketoacidoses) ([place]) 02/17/2015  . Emphysematous cystitis     Past Surgical History:  Procedure Laterality Date  . ABDOMINAL HYSTERECTOMY  1996  . APPENDECTOMY  1997  . ESOPHAGOGASTRODUODENOSCOPY N/A 11/12/2017   Procedure: ESOPHAGOGASTRODUODENOSCOPY (EGD);  Surgeon: Lin Landsman, MD;  Location: [facility] ENDOSCOPY;  Service: Gastroenterology;  Laterality: N/A;  . ESOPHAGOGASTRODUODENOSCOPY N/A 09/29/2018   Procedure: ESOPHAGOGASTRODUODENOSCOPY (EGD);  Surgeon: Toledo, Benay Pike, MD;  Location: [facility] ENDOSCOPY;  Service: Gastroenterology;  Laterality: N/A;  . ESOPHAGOGASTRODUODENOSCOPY (EGD) WITH PROPOFOL N/A 04/24/2016   Procedure: ESOPHAGOGASTRODUODENOSCOPY (EGD) WITH PROPOFOL;  Surgeon: [name], MD;  Location: [facility] ENDOSCOPY;  Service: Endoscopy;  Laterality: N/A;  . ESOPHAGOGASTRODUODENOSCOPY (EGD) WITH PROPOFOL N/A 01/28/2019   Procedure: ESOPHAGOGASTRODUODENOSCOPY (EGD) WITH PROPOFOL;  Surgeon: Toledo, Benay Pike, MD;  Location: [facility] ENDOSCOPY;  Service: Gastroenterology;  Laterality: N/A;  . HAND SURGERY  1988  .  THYROID SURGERY  2013    Prior to Admission medications   Medication Sig Start Date End Date Taking? Authorizing Provider  amLODipine (NORVASC) 10 MG tablet Take 10 mg by mouth daily.   Yes [provider]  docusate sodium (COLACE) 100 MG capsule Take 100 mg by mouth 2 (two) times daily.   Yes [provider]  famotidine (PEPCID) 20 MG tablet Take 1 tablet (20 mg total) by mouth 2 (two) times daily. 07/01/19  Yes Carrie Mew, MD  insulin aspart (NOVOLOG) 100 UNIT/ML FlexPen Inject 2 Units into the skin 3 (three) times daily with meals. 06/18/19  Yes Wieting, Richard, MD  insulin glargine (LANTUS) 100 unit/mL SOPN Inject 0.2 mLs (20 Units total) into the skin at bedtime. 06/18/19  Yes Wieting, Richard, MD  lipase/protease/amylase (CREON) 36000 UNITS CPEP capsule Take 2 capsules (72,000 Units total) by mouth 3 (three) times daily with meals. 01/29/19  Yes Wieting, Richard, MD  omeprazole (PRILOSEC) 40 MG capsule Take 40 mg by mouth in the morning and at bedtime.   Yes [provider]  Polyethylene Glycol 3350 POWD Take 17 g by mouth at bedtime. Mix 17 grams in 4-8 oz of liquid and drink    Yes [provider]  pregabalin (LYRICA) 200 MG capsule Take 200 mg by mouth 2 (two) times daily.    Yes [provider]  rosuvastatin (CRESTOR) 20 MG tablet Take 40 mg by mouth 2 (two) times daily.    Yes [provider]  zolpidem (AMBIEN) 10 MG tablet Take 1 tablet by mouth at bedtime. 05/10/19  Yes [provider]  cyclobenzaprine (FLEXERIL) 5 MG tablet Take 5 mg by mouth 2 (two) times daily as needed for muscle spasms.     [provider]  HYDROcodone-acetaminophen (NORCO/VICODIN) 5-325 MG tablet One tablet every four hours as needed for three days then go back to you usual one tablet every six hours Patient taking differently: Take 1 tablet by mouth every 4 (four) hours as needed for moderate pain.  06/18/19   Wieting, Richard, MD  Insulin  Pen Needle 29G X 12MM MISC 1 Dose by Does not apply route 3 (three) times daily before meals. 06/18/19   Loletha Grayer, MD  ondansetron (ZOFRAN ODT) 4 MG disintegrating tablet Take 1 tablet (4 mg total) by mouth every 8 (eight) hours as needed for nausea or vomiting. 07/01/19   Carrie Mew, MD  QUEtiapine (SEROQUEL) 25 MG tablet Take 25 mg by mouth as needed (DEPRESSION).     [provider]  simethicone (MYLICON) 80 MG chewable tablet Chew 1 tablet (80 mg total) by mouth 4 (four) times daily. Patient not taking: Reported on 08/02/2019 06/18/19   Loletha Grayer, MD  triamcinolone cream (KENALOG) 0.5 % Apply twice a day to back of thighs 06/18/19   Loletha Grayer, MD  GLUCERNA [facility]) LIQD Take 237 mLs by mouth 3 (three) times daily between meals. 02/19/15 02/27/15  Bettey Costa, MD    Allergies Penicillins, Reglan [metoclopramide], Fentanyl, and Percocet [oxycodone-acetaminophen]  Family History  Family history unknown: Yes    Social History Social History   Tobacco Use  . Smoking status: Former Research scientist (life sciences)  . Smokeless tobacco: Never Used  . Tobacco comment: quit 7 years ago  Vaping Use  . Vaping Use: Some days  Substance Use Topics  . Alcohol use: Not Currently    Comment: quit drinking 2 years ago   . Drug use: No    Review of Systems  Review of Systems  Constitutional: Positive for fatigue. Negative for fever.  HENT: Negative for congestion and sore throat.   Eyes: Negative for visual disturbance.  Respiratory: Negative for cough and shortness of breath.   Cardiovascular: Negative for chest pain.  Gastrointestinal: Positive for abdominal pain, nausea and vomiting. Negative for diarrhea.  Genitourinary: Negative for flank pain.  Musculoskeletal: Negative for back pain and neck pain.  Skin: Negative for rash and wound.  Neurological: Positive for weakness.  All other systems reviewed and are negative.     ____________________________________________  PHYSICAL EXAM:      VITAL SIGNS: ED Triage Vitals [08/02/19 0712]  Enc Vitals Group     BP (!) 164/95     Pulse Rate 76     Resp 16     Temp 98.3 F (36.8 C)     Temp Source Oral     SpO2 97 %     Weight 119 lb 14.9 oz (54.4 kg)     Height 5\' 1"  (1.549 m)     Head Circumference      Peak Flow      Pain Score 8     Pain Loc      Pain Edu?      Excl. in [place]?      Physical Exam Vitals and nursing note reviewed.  Constitutional:      General: She is not in acute distress.    Appearance: She is well-developed.  HENT:     Head: Normocephalic and atraumatic.     Mouth/Throat:     Comments: Dry MM Eyes:     Conjunctiva/sclera: Conjunctivae normal.  Cardiovascular:     Rate and Rhythm: Normal rate and regular rhythm.     Heart sounds: Normal heart sounds. No murmur heard.  No friction rub.  Pulmonary:     Effort: Pulmonary effort is normal. No respiratory distress.     Breath sounds: Normal breath sounds. No wheezing or rales.  Abdominal:     General: There is no distension.     Palpations: Abdomen is soft.     Tenderness: There is abdominal tenderness in the right upper quadrant, epigastric area and periumbilical area.  Musculoskeletal:     Cervical back: Neck supple.  Skin:    General: Skin is warm.     Capillary Refill: Capillary refill takes less than 2 seconds.  Neurological:     Mental Status: She is alert and oriented to person, place, and time.     Motor: No abnormal muscle tone.       ____________________________________________   LABS (all labs ordered are listed, but only abnormal results are displayed)  Labs Reviewed  LIPASE, BLOOD - Abnormal; Notable for the following components:      Result Value   Lipase 64 (*)    All other components within normal limits  COMPREHENSIVE METABOLIC PANEL - Abnormal; Notable for the following components:   Glucose, Bld 345 (*)    BUN 25 (*)    Creatinine, Ser  1.47 (*)    Alkaline Phosphatase 233 (*)    GFR calc non Af Amer 38 (*)    GFR calc Af Amer 44 (*)    All other components within normal limits  CBC - Abnormal;  Notable for the following components:   Hemoglobin 11.7 (*)    HCT 35.3 (*)    All other components within normal limits  SARS CORONAVIRUS 2 BY RT PCR (HOSPITAL ORDER, [facility])  URINALYSIS, COMPLETE (UACMP) WITH MICROSCOPIC    ____________________________________________  EKG: None ________________________________________  RADIOLOGY All imaging, including plain films, CT scans, and ultrasounds, independently reviewed by me, and interpretations confirmed via formal radiology reads.  ED MD interpretation:   [place]: No acute abnormality, mild CBD dilatation  Official radiology report(s): No results found.  ____________________________________________  PROCEDURES   Procedure(s) performed (including Critical Care):  Procedures  ____________________________________________  INITIAL IMPRESSION / MDM / [place] / ED COURSE  As part of my medical decision making, I reviewed the following data within the [place] notes reviewed and incorporated, Old chart reviewed, Notes from prior ED visits, and [place] Controlled Substance Database       *HARU ANSPAUGH was evaluated in Emergency Department on 08/02/2019 for the symptoms described in the history of present illness. She was evaluated in the context of the global COVID-19 pandemic, which necessitated consideration that the patient might be at risk for infection with the SARS-CoV-2 virus that causes COVID-19. Institutional protocols and algorithms that pertain to the evaluation of patients at risk for COVID-19 are in a state of rapid change based on information released by regulatory bodies including the CDC and federal and state organizations. These policies and algorithms were followed during the patient's care in the  ED.  Some ED evaluations and interventions may be delayed as a result of limited staffing during the pandemic.*     Medical Decision Making:  64 yo F here with suspected recurrent, acute on chronic pancreatitis with nausea/vomiting. Pt just seen yesterday for same, has not been able to tolerate PO since. Labs overall near baseline. Lipase chronically elevated. Recent CT shows no pseudocyst or other complications and U/S yesterday unremarkable, will hold on repeat imaging. Admit for IVF, pain control, antiemetics.  ____________________________________________  FINAL CLINICAL IMPRESSION(S) / ED DIAGNOSES  Final diagnoses:  Recurrent pancreatitis     MEDICATIONS GIVEN DURING THIS VISIT:  Medications  sodium chloride flush (NS) 0.9 % injection 3 mL (has no administration in time range)  HYDROmorphone (DILAUDID) injection 1 mg (has no administration in time range)  sodium chloride 0.9 % bolus 1,000 mL (0 mLs Intravenous Stopped 08/02/19 1458)  ondansetron (ZOFRAN) injection 4 mg (4 mg Intravenous Given 08/02/19 1231)  HYDROmorphone (DILAUDID) injection 0.5 mg (0.5 mg Intravenous Given 08/02/19 1232)     ED Discharge Orders    None       Note:  This document was prepared using Dragon voice recognition software and may include unintentional dictation errors.   Duffy Bruce, MD 08/02/19 [phone]

## 2019-08-02 NOTE — ED Triage Notes (Addendum)
LUQ and vomiting.  States seen yesterday for same, diagnosed with pancreatitis.  AAOx3.  Skin warm and dry. NAD

## 2019-08-02 NOTE — ED Notes (Signed)
See triage note, pt reports seen yesterday in ER for pancreatitis, here today for the same. Reports pain in upper quadrants with vomiting.  Ambulatory. Alert and oriented.

## 2019-08-02 NOTE — H&P (Signed)
History and Physical    Karen [name] PNT:[id number] DOB: 1955/03/22 DOA: 08/02/2019  Referring MD/NP/PA:   PCP: Lavera Guise, MD   Patient coming from:  The patient is coming from home.  At baseline, pt is independent for most of ADL.        Chief Complaint: Abdominal pain  HPI: Karen [name] is a 64 y.o. female with medical history significant of alcohol abuse in remission, alcoholic pancreatitis, hyperlipidemia, hypertension, diabetes mellitus, depression, asthma, DKA, emphysematous cystitis, GI bleeding, CKD stage III, who presents with nausea, vomiting, abdominal pain.  Patient states that she has been having abdominal pain the past several days, which is located in the upper abdomen, 10 out of 10 severity, sharp, nonradiating.  Associated with nausea and vomiting.  She states that she vomits every time she tried to eat food.  No diarrhea.  No fever or chills.  Patient does not have chest pain, shortness breath, cough.  No symptoms of UTI or unilateral weakness.  Patient was seen in ED for the same.  She was diagnosed with mild acute pancreatitis.  She was discharged on Vicodin and Zofran.  She states that she continues to have abdominal pain, nausea and vomiting.  She cannot eat food or drink liquids normally.  ED Course: pt was found to have lipase 64, WBC 7.1, pending COVID-19 PCR, renal function close to baseline, temperature normal, blood pressure 164/95, heart rate 76, RR 16, oxygen saturation 94% on room air.  Abdominal ultrasound on 7/27: 1. No acute finding. 2. Mild common bile duct dilatation, stable to slightly less than on 03/15/2019. 3. Unchanged 12 mm hyperechoic liver lesion, possibly a hemangioma    Review of Systems:   General: no fevers, chills, no body weight gain, has poor appetite, has fatigue HEENT: no blurry vision, hearing changes or sore throat Respiratory: no dyspnea, coughing, wheezing CV: no chest pain, no palpitations GI: has nausea, vomiting, abdominal  pain, no diarrhea, constipation. Has abdominal distention GU: no dysuria, burning on urination, increased urinary frequency, hematuria  Ext: no leg edema Neuro: no unilateral weakness, numbness, or tingling, no vision change or hearing loss Skin: no rash, no skin tear. MSK: No muscle spasm, no deformity, no limitation of range of movement in spin Heme: No easy bruising.  Travel history: No recent long distant travel.  Allergy:  Allergies  Allergen Reactions  . Penicillins Anaphylaxis    Has patient had a PCN reaction causing immediate rash, facial/tongue/throat swelling, SOB or lightheadedness with hypotension: Yes Has patient had a PCN reaction causing severe rash involving mucus membranes or skin necrosis: No Has patient had a PCN reaction that required hospitalization No Has patient had a PCN reaction occurring within the last 10 years: No If all of the above answers are "NO", then may proceed with Cephalosporin use.  . Reglan [Metoclopramide] Other (See Comments)    Hypotension, shortness of breath  . Fentanyl     Rash   . Percocet [Oxycodone-Acetaminophen] Rash    plain tylenol can also make itch?    Past Medical History:  Diagnosis Date  . Alcoholic pancreatitis   . Asthma   . Depression   . Diabetes mellitus without complication ([place])   . DKA (diabetic ketoacidoses) ([place]) 02/17/2015  . Emphysematous cystitis   . Hypercholesteremia   . Hypertension   . Hypokalemia     Past Surgical History:  Procedure Laterality Date  . ABDOMINAL HYSTERECTOMY  1996  . APPENDECTOMY  1997  . ESOPHAGOGASTRODUODENOSCOPY  N/A 11/12/2017   Procedure: ESOPHAGOGASTRODUODENOSCOPY (EGD);  Surgeon: Lin Landsman, MD;  Location: [facility] ENDOSCOPY;  Service: Gastroenterology;  Laterality: N/A;  . ESOPHAGOGASTRODUODENOSCOPY N/A 09/29/2018   Procedure: ESOPHAGOGASTRODUODENOSCOPY (EGD);  Surgeon: Toledo, Benay Pike, MD;  Location: [facility] ENDOSCOPY;  Service: Gastroenterology;  Laterality: N/A;  .  ESOPHAGOGASTRODUODENOSCOPY (EGD) WITH PROPOFOL N/A 04/24/2016   Procedure: ESOPHAGOGASTRODUODENOSCOPY (EGD) WITH PROPOFOL;  Surgeon: [name], MD;  Location: [facility] ENDOSCOPY;  Service: Endoscopy;  Laterality: N/A;  . ESOPHAGOGASTRODUODENOSCOPY (EGD) WITH PROPOFOL N/A 01/28/2019   Procedure: ESOPHAGOGASTRODUODENOSCOPY (EGD) WITH PROPOFOL;  Surgeon: Toledo, Benay Pike, MD;  Location: [facility] ENDOSCOPY;  Service: Gastroenterology;  Laterality: N/A;  . HAND SURGERY  1988  . THYROID SURGERY  2013    Social History:  reports that she has quit smoking. She has never used smokeless tobacco. She reports previous alcohol use. She reports that she does not use drugs.  Family History: Reviewed with patient, but patient does not know her family medical history. Family History  Family history unknown: Yes     Prior to Admission medications   Medication Sig Start Date End Date Taking? Authorizing Provider  amLODipine (NORVASC) 10 MG tablet Take 10 mg by mouth daily.   Yes [provider]  docusate sodium (COLACE) 100 MG capsule Take 100 mg by mouth 2 (two) times daily.   Yes [provider]  famotidine (PEPCID) 20 MG tablet Take 1 tablet (20 mg total) by mouth 2 (two) times daily. 07/01/19  Yes Carrie Mew, MD  insulin aspart (NOVOLOG) 100 UNIT/ML FlexPen Inject 2 Units into the skin 3 (three) times daily with meals. 06/18/19  Yes Wieting, Richard, MD  insulin glargine (LANTUS) 100 unit/mL SOPN Inject 0.2 mLs (20 Units total) into the skin at bedtime. 06/18/19  Yes Wieting, Richard, MD  lipase/protease/amylase (CREON) 36000 UNITS CPEP capsule Take 2 capsules (72,000 Units total) by mouth 3 (three) times daily with meals. 01/29/19  Yes Wieting, Richard, MD  omeprazole (PRILOSEC) 40 MG capsule Take 40 mg by mouth in the morning and at bedtime.   Yes [provider]  Polyethylene Glycol 3350 POWD Take 17 g by mouth at bedtime. Mix 17 grams in 4-8 oz of liquid and drink    Yes [provider]  pregabalin (LYRICA) 200 MG capsule Take 200 mg by mouth 2 (two) times daily.    Yes [provider]  rosuvastatin (CRESTOR) 20 MG tablet Take 40 mg by mouth 2 (two) times daily.    Yes [provider]  zolpidem (AMBIEN) 10 MG tablet Take 1 tablet by mouth at bedtime. 05/10/19  Yes [provider]  cyclobenzaprine (FLEXERIL) 5 MG tablet Take 5 mg by mouth 2 (two) times daily as needed for muscle spasms.     [provider]  HYDROcodone-acetaminophen (NORCO/VICODIN) 5-325 MG tablet One tablet every four hours as needed for three days then go back to you usual one tablet every six hours Patient taking differently: Take 1 tablet by mouth every 4 (four) hours as needed for moderate pain.  06/18/19   Wieting, Richard, MD  Insulin Pen Needle 29G X 12MM MISC 1 Dose by Does not apply route 3 (three) times daily before meals. 06/18/19   Loletha Grayer, MD  ondansetron (ZOFRAN ODT) 4 MG disintegrating tablet Take 1 tablet (4 mg total) by mouth every 8 (eight) hours as needed for nausea or vomiting. 07/01/19   Carrie Mew, MD  QUEtiapine (SEROQUEL) 25 MG tablet Take 25 mg by mouth as  needed (DEPRESSION).     [provider]  simethicone (MYLICON) 80 MG chewable tablet Chew 1 tablet (80 mg total) by mouth 4 (four) times daily. Patient not taking: Reported on 08/02/2019 06/18/19   Loletha Grayer, MD  triamcinolone cream (KENALOG) 0.5 % Apply twice a day to back of thighs 06/18/19   Loletha Grayer, MD  GLUCERNA [facility]) LIQD Take 237 mLs by mouth 3 (three) times daily between meals. 02/19/15 02/27/15  Bettey Costa, MD    Physical Exam: Vitals:   08/02/19 0712  BP: (!) 164/95  Pulse: 76  Resp: 16  Temp: 98.3 F (36.8 C)  TempSrc: Oral  SpO2: 97%  Weight: 54.4 kg  Height: 5\' 1"  (1.549 m)   General: Not in acute distress HEENT:       Eyes: PERRL, EOMI, no scleral icterus.       ENT: No discharge from the ears and nose, no pharynx  injection, no tonsillar enlargement.        Neck: No JVD, no bruit, no mass felt. Heme: No neck lymph node enlargement. Cardiac: S1/S2, RRR, No murmurs, No gallops or rubs. Respiratory: No rales, wheezing, rhonchi or rubs. GI: distended, has tenderness in upper abdomen, no rebound pain, no organomegaly, BS present. GU: No hematuria Ext: No pitting leg edema bilaterally. 2+DP/PT pulse bilaterally. Musculoskeletal: No joint deformities, No joint redness or warmth, no limitation of ROM in spin. Skin: No rashes.  Neuro: Alert, oriented X3, cranial nerves II-XII grossly intact, moves all extremities normally.  Psych: Patient is not psychotic, no suicidal or hemocidal ideation.  Labs on Admission: I have personally reviewed following labs and imaging studies  CBC: Recent Labs  Lab 08/01/19 0621 08/02/19 0715  WBC 9.7 7.1  NEUTROABS 6.1  --   HGB 12.0 11.7*  HCT 35.2* 35.3*  MCV 84.2 85.7  PLT 241 481   Basic Metabolic Panel: Recent Labs  Lab 08/01/19 0621 08/02/19 0715  NA 139 138  K 4.6 4.8  CL 103 102  CO2 25 27  GLUCOSE 244* 345*  BUN 28* 25*  CREATININE 1.35* 1.47*  CALCIUM 9.2 8.9   GFR: Estimated Creatinine Clearance: 29.9 mL/min (A) (by C-G formula based on SCr of 1.47 mg/dL (H)). Liver Function Tests: Recent Labs  Lab 08/01/19 0621 08/02/19 0715  AST 36 28  ALT 50* 40  ALKPHOS 280* 233*  BILITOT 0.6 0.6  PROT 7.6 7.7  ALBUMIN 3.8 3.8   Recent Labs  Lab 08/01/19 0621 08/02/19 0715  LIPASE 66* 64*   No results for input(s): AMMONIA in the last 168 hours. Coagulation Profile: No results for input(s): INR, PROTIME in the last 168 hours. Cardiac Enzymes: No results for input(s): CKTOTAL, CKMB, CKMBINDEX, TROPONINI in the last 168 hours. BNP (last 3 results) No results for input(s): PROBNP in the last 8760 hours. HbA1C: No results for input(s): HGBA1C in the last 72 hours. CBG: No results for input(s): GLUCAP in the last 168 hours. Lipid  Profile: No results for input(s): CHOL, HDL, LDLCALC, TRIG, CHOLHDL, LDLDIRECT in the last 72 hours. Thyroid Function Tests: No results for input(s): TSH, T4TOTAL, FREET4, T3FREE, THYROIDAB in the last 72 hours. Anemia Panel: No results for input(s): VITAMINB12, FOLATE, FERRITIN, TIBC, IRON, RETICCTPCT in the last 72 hours. Urine analysis:    Component Value Date/Time   COLORURINE STRAW (A) 08/01/2019 0621   APPEARANCEUR CLEAR (A) 08/01/2019 0621   APPEARANCEUR Hazy 08/11/2013 0729   LABSPEC 1.005 08/01/2019 0621   LABSPEC 1.030 08/11/2013 0729  PHURINE 7.0 08/01/2019 0621   GLUCOSEU >=500 (A) 08/01/2019 0621   GLUCOSEU >=500 08/11/2013 0729   HGBUR NEGATIVE 08/01/2019 0621   BILIRUBINUR NEGATIVE 08/01/2019 0621   BILIRUBINUR Negative 08/11/2013 0729   KETONESUR NEGATIVE 08/01/2019 0621   PROTEINUR 100 (A) 08/01/2019 0621   NITRITE NEGATIVE 08/01/2019 0621   LEUKOCYTESUR TRACE (A) 08/01/2019 0621   LEUKOCYTESUR Negative 08/11/2013 0729   Sepsis Labs: @LABRCNTIP (procalcitonin:4,lacticidven:4) )No results found for this or any previous visit (from the past 240 hour(s)).   Radiological Exams on Admission: US Abdomen Complete  Result Date: 08/01/2019 CLINICAL DATA:  Upper abdominal pain. Elevated alkaline phosphatase. History of pancreatitis. EXAM: ABDOMEN ULTRASOUND COMPLETE COMPARISON:  CT abdomen and pelvis 07/01/2019. Right upper quadrant abdominal ultrasound 03/15/2019. FINDINGS: Gallbladder: No gallstones or wall thickening visualized. No sonographic Murphy sign noted by sonographer. Common bile duct: Diameter: Mildly dilated at 9 mm (10 mm on the 03/15/2019 ultrasound) Liver: Mildly heterogeneous parenchymal echogenicity diffusely, nonspecific. 12 mm hyperechoic lesion in the posterior right hepatic lobe, unchanged from the prior ultrasound. Portal vein is patent on color Doppler imaging with normal direction of blood flow towards the liver. IVC: No abnormality visualized.  Pancreas: Visualized portion unremarkable. Spleen: Size and appearance within normal limits. Right Kidney: Length: 9.8 cm. Echogenicity within normal limits. No mass or hydronephrosis visualized. Left Kidney: Length: 10.6 cm. Echogenicity within normal limits. No mass or hydronephrosis visualized. Abdominal aorta: Atherosclerosis. Distal abdominal aorta obscured by bowel gas. No aneurysm visualized in the more proximal abdominal aorta. Other findings: None. IMPRESSION: 1. No acute finding. 2. Mild common bile duct dilatation, stable to slightly less than on 03/15/2019. 3. Unchanged 12 mm hyperechoic liver lesion, possibly a hemangioma. Electronically Signed   By: Logan Bores M.D.   On: 08/01/2019 13:59     EKG:   Not done in ED  Assessment/Plan Principal Problem:   Abdominal pain Active Problems:   Hypertension   HLD (hyperlipidemia)   GERD (gastroesophageal reflux disease)   Acute on chronic pancreatitis (HCC)   CKD (chronic kidney disease), stage IIIa   Type II diabetes mellitus with renal manifestations (HCC)    Abdominal pain:Patient also has nausea, vomiting. Possibly due to pancreatitis flareup, lipase is 64.   -will place on med-surg bed for obs -prn dilaudidand nocor for pain, Zofran for nausea -IV fluid:1 L normal saline in ED, followed by 75 cc/h  Hx ofETOH abuse:In remission in past 4 years.No signs of alcohol withdrawal -Observe closely  HLD (hyperlipidemia): -Crestor  CKD (chronic kidney disease), stage IIIa: Stable. recently baseline creatinine 1.1-1.4. Her creatinine is 1.47, BUN 25. Close to baseline -Follow-up BMP  HTN (hypertension) -Continue home amlodipine -prnhydralazine  Type II diabetes mellitus with renal manifestations ([place]):Most recent A1c10.7,poorly controled. Patient is taking NovoLog and Lantus at home. -will decrease Lantus dose from 20 to 15 units -SSI  Hx of chronic pancreatitis: -continue creon  GERD  (gastroesophageal reflux disease) -Protonix and Pepcid    Status is: Observation  The patient remains OBS appropriate and will d/c before 2 midnights.  Dispo: The patient is from: Home              Anticipated d/c is to: Home              Anticipated d/c date is: 1 day              Patient currently is not medically stable to d/c.           DVT ppx:  SQ Lovenox Code Status: Full code Family Communication: not done, no family member is at bed side.    Disposition Plan:  Anticipate discharge back to previous environment Consults called:  none Admission status: Med-surg bed for obs       Date of Service 08/02/2019    [facility]   If 7PM-7AM, please contact night-coverage [email or web address] 08/02/2019, 3:59 PM

## 2019-08-02 NOTE — ED Notes (Signed)
Pt assisted to restroom, standby assist 

## 2019-08-02 NOTE — ED Notes (Signed)
Pt resting at this time, call bell within reach, stretcher locked in lowest position 

## 2019-08-03 DIAGNOSIS — G8929 Other chronic pain: Secondary | ICD-10-CM | POA: Diagnosis present

## 2019-08-03 DIAGNOSIS — I10 Essential (primary) hypertension: Secondary | ICD-10-CM | POA: Diagnosis not present

## 2019-08-03 DIAGNOSIS — K859 Acute pancreatitis without necrosis or infection, unspecified: Secondary | ICD-10-CM | POA: Diagnosis present

## 2019-08-03 DIAGNOSIS — Z888 Allergy status to other drugs, medicaments and biological substances status: Secondary | ICD-10-CM | POA: Diagnosis not present

## 2019-08-03 DIAGNOSIS — Z794 Long term (current) use of insulin: Secondary | ICD-10-CM | POA: Diagnosis not present

## 2019-08-03 DIAGNOSIS — K219 Gastro-esophageal reflux disease without esophagitis: Secondary | ICD-10-CM | POA: Diagnosis present

## 2019-08-03 DIAGNOSIS — F1011 Alcohol abuse, in remission: Secondary | ICD-10-CM | POA: Diagnosis present

## 2019-08-03 DIAGNOSIS — R109 Unspecified abdominal pain: Secondary | ICD-10-CM | POA: Diagnosis not present

## 2019-08-03 DIAGNOSIS — F329 Major depressive disorder, single episode, unspecified: Secondary | ICD-10-CM | POA: Diagnosis present

## 2019-08-03 DIAGNOSIS — Z87891 Personal history of nicotine dependence: Secondary | ICD-10-CM | POA: Diagnosis not present

## 2019-08-03 DIAGNOSIS — Z886 Allergy status to analgesic agent status: Secondary | ICD-10-CM | POA: Diagnosis not present

## 2019-08-03 DIAGNOSIS — N1831 Chronic kidney disease, stage 3a: Secondary | ICD-10-CM | POA: Diagnosis present

## 2019-08-03 DIAGNOSIS — I129 Hypertensive chronic kidney disease with stage 1 through stage 4 chronic kidney disease, or unspecified chronic kidney disease: Secondary | ICD-10-CM | POA: Diagnosis present

## 2019-08-03 DIAGNOSIS — Z20822 Contact with and (suspected) exposure to covid-19: Secondary | ICD-10-CM | POA: Diagnosis present

## 2019-08-03 DIAGNOSIS — K838 Other specified diseases of biliary tract: Secondary | ICD-10-CM | POA: Diagnosis present

## 2019-08-03 DIAGNOSIS — Z79899 Other long term (current) drug therapy: Secondary | ICD-10-CM | POA: Diagnosis not present

## 2019-08-03 DIAGNOSIS — K852 Alcohol induced acute pancreatitis without necrosis or infection: Secondary | ICD-10-CM | POA: Diagnosis present

## 2019-08-03 DIAGNOSIS — E1122 Type 2 diabetes mellitus with diabetic chronic kidney disease: Secondary | ICD-10-CM | POA: Diagnosis present

## 2019-08-03 DIAGNOSIS — Z79891 Long term (current) use of opiate analgesic: Secondary | ICD-10-CM | POA: Diagnosis not present

## 2019-08-03 DIAGNOSIS — E1142 Type 2 diabetes mellitus with diabetic polyneuropathy: Secondary | ICD-10-CM | POA: Diagnosis present

## 2019-08-03 DIAGNOSIS — K860 Alcohol-induced chronic pancreatitis: Secondary | ICD-10-CM | POA: Diagnosis present

## 2019-08-03 DIAGNOSIS — J45909 Unspecified asthma, uncomplicated: Secondary | ICD-10-CM | POA: Diagnosis present

## 2019-08-03 DIAGNOSIS — N3080 Other cystitis without hematuria: Secondary | ICD-10-CM | POA: Diagnosis present

## 2019-08-03 DIAGNOSIS — E1165 Type 2 diabetes mellitus with hyperglycemia: Secondary | ICD-10-CM | POA: Diagnosis present

## 2019-08-03 DIAGNOSIS — Z880 Allergy status to penicillin: Secondary | ICD-10-CM | POA: Diagnosis not present

## 2019-08-03 DIAGNOSIS — E785 Hyperlipidemia, unspecified: Secondary | ICD-10-CM | POA: Diagnosis present

## 2019-08-03 LAB — CBC
HCT: 33.9 % — ABNORMAL LOW (ref 36.0–46.0)
Hemoglobin: 11.5 g/dL — ABNORMAL LOW (ref 12.0–15.0)
MCH: 28.4 pg (ref 26.0–34.0)
MCHC: 33.9 g/dL (ref 30.0–36.0)
MCV: 83.7 fL (ref 80.0–100.0)
Platelets: 241 10*3/uL (ref 150–400)
RBC: 4.05 MIL/uL (ref 3.87–5.11)
RDW: 11.9 % (ref 11.5–15.5)
WBC: 8.5 10*3/uL (ref 4.0–10.5)
nRBC: 0 % (ref 0.0–0.2)

## 2019-08-03 LAB — GLUCOSE, CAPILLARY
Glucose-Capillary: 105 mg/dL — ABNORMAL HIGH (ref 70–99)
Glucose-Capillary: 62 mg/dL — ABNORMAL LOW (ref 70–99)
Glucose-Capillary: 191 mg/dL — ABNORMAL HIGH (ref 70–99)
Glucose-Capillary: 167 mg/dL — ABNORMAL HIGH (ref 70–99)

## 2019-08-03 LAB — BASIC METABOLIC PANEL
Anion gap: 11 (ref 5–15)
BUN: 19 mg/dL (ref 8–23)
CO2: 24 mmol/L (ref 22–32)
Calcium: 8.3 mg/dL — ABNORMAL LOW (ref 8.9–10.3)
Chloride: 108 mmol/L (ref 98–111)
Creatinine, Ser: 1.17 mg/dL — ABNORMAL HIGH (ref 0.44–1.00)
GFR calc Af Amer: 58 mL/min — ABNORMAL LOW (ref >60)
GFR calc non Af Amer: 50 mL/min — ABNORMAL LOW (ref >60)
Glucose, Bld: 83 mg/dL (ref 70–99)
Potassium: 3.8 mmol/L (ref 3.5–5.1)
Sodium: 143 mmol/L (ref 135–145)

## 2019-08-03 MED ORDER — BUTALBITAL-APAP-CAFFEINE 50-325-40 MG PO TABS
1.0000 | ORAL_TABLET | Freq: Once | ORAL | Status: AC
  Administered 2019-08-03: 1 via ORAL
  Filled 2019-08-03: qty 1

## 2019-08-03 NOTE — Progress Notes (Signed)
Inpatient Diabetes Program Recommendations  AACE/ADA: New Consensus Statement on Inpatient Glycemic Control (2015)  Target Ranges:  Prepandial:   less than 140 mg/dL      Peak postprandial:   less than 180 mg/dL (1-2 hours)      Critically ill patients:  140 - 180 mg/dL   Lab Results  Component Value Date   GLUCAP 105 (H) 08/03/2019   HGBA1C 10.7 (H) 06/14/2019    Review of Glycemic Control  Diabetes history: DM 2 Outpatient Diabetes medications: Lantus 20 units qhs, Novolog 2 units tid Current orders for Inpatient glycemic control:  Lantus 15 units qhs  A1c 10.7% on 6/9 DM Coordinator spoke with pt in February and again in March this year regarding insulin regimen  Inpatient Diabetes Program Recommendations:    Lantus not given last night due to medication not being available.  -  Consider holding Lantus until glucose trends are consistently >180 mg/dl  -  start Novolog 0-9 units tid + hs  Thanks,  Tama Headings RN, MSN, BC-ADM Inpatient Diabetes Coordinator Team Pager [phone] (8a-5p)

## 2019-08-03 NOTE — Progress Notes (Signed)
PROGRESS NOTE    Karen [name]  LGX:[id number] DOB: March 28, 1955 DOA: 08/02/2019 PCP: Lavera Guise, MD    Assessment & Plan:   Principal Problem:   Abdominal pain Active Problems:   Chronic pancreatitis ([place])   Hypertension   HLD (hyperlipidemia)   GERD (gastroesophageal reflux disease)   Acute on chronic pancreatitis ([place])   CKD (chronic kidney disease), stage IIIa   Type II diabetes mellitus with renal manifestations (HCC)    Karen [name] is a 64 y.o. female with medical history significant of alcohol abuse in remission, alcoholic pancreatitis, hyperlipidemia, hypertension, diabetes mellitus, depression, asthma, DKA, emphysematous cystitis, GI bleeding, CKD stage III, who presented with nausea, vomiting, abdominal pain.    N/V and Abdominal pain 2/2 chronic pancreatitis  --lipase is64, similar to prior, and appears to be her baseline, so not likely represent acute pancreatitis.   PLAN: --continue MIVF --clear liquid for now, advance as tolerated --Home Norco for pain --d/c IV dilaudid --continue home Creon  Hx ofETOH abuse In remission in past 4 years.No signs of alcohol withdrawal -Monitor for withdrawal symptoms  HLD (hyperlipidemia): -Crestor  CKD (chronic kidney disease), stage IIIa: Stable. recently baseline creatinine 1.1-1.4. Her creatinine is 1.47, BUN25. Close to baseline -Follow-up BMP  HTN (hypertension) -Continue home amlodipine -prnhydralazine  Type II diabetes mellitus with renal manifestations ([place]):Most recent A1c10.7,poorly controled. Patient is taking NovoLog and Lantusat home. -continue Lantus 15 units nightly -SSI TID  GERD (gastroesophageal reflux disease) -Protonix and Pepcid  Chronic pain on chronic opioids --continue home dose of Norco --continue home Lyrica --continue bowel regimen to avoid constipation   DVT prophylaxis: Lovenox SQ Code Status: Full code  Family Communication:  Status is:  inpatient Dispo:   The patient is from: home Anticipated d/c is to: home Anticipated d/c date is: 2-3 days Patient currently is not medically stable to d/c due to: abdominal pain with oral intake, on IVF, need to tolerate diet before discharge.   Subjective and Interval History:  Pt reported headache from IV pain meds.  Said that ice cream brought on some abdominal pain.  No BM.   Objective: Vitals:   08/04/19 1214 08/04/19 1652 08/04/19 1937 08/04/19 2333  BP: (!) 147/73 124/75 (!) 127/63 (!) 133/68  Pulse: 64 67 67 72  Resp: 16 14  15   Temp: 98.4 F (36.9 C) 98.5 F (36.9 C) 98.9 F (37.2 C) 98.4 F (36.9 C)  TempSrc: Oral Oral Oral Oral  SpO2: 98% 92% 99% 97%  Weight:      Height:        Intake/Output Summary (Last 24 hours) at 08/05/2019 0343 Last data filed at 08/04/2019 1830 Gross per 24 hour  Intake 396 ml  Output --  Net 396 ml   Filed Weights   08/02/19 0712  Weight: 54.4 kg    Examination:   Constitutional: NAD, AAOx3 HEENT: conjunctivae and lids normal, EOMI CV: RRR no M,R,G. Distal pulses +2.  No cyanosis.   RESP: CTA B/L, normal respiratory effort  GI: +BS, NTND Extremities: No effusions, edema, or tenderness in BLE SKIN: warm, dry and intact Neuro: II - XII grossly intact.  Sensation intact    Data Reviewed: I have personally reviewed following labs and imaging studies  CBC: Recent Labs  Lab 08/01/19 0621 08/02/19 0715 08/03/19 0507 08/04/19 1007  WBC 9.7 7.1 8.5 8.0  NEUTROABS 6.1  --   --   --   HGB 12.0 11.7* 11.5* 11.2*  HCT 35.2* 35.3* 33.9*  32.9*  MCV 84.2 85.7 83.7 84.6  PLT 241 244 241 161   Basic Metabolic Panel: Recent Labs  Lab 08/01/19 0621 08/02/19 0715 08/03/19 0507 08/04/19 1007  NA 139 138 143 137  K 4.6 4.8 3.8 4.0  CL 103 102 108 108  CO2 25 27 24 22   GLUCOSE 244* 345* 83 245*  BUN 28* 25* 19 12  CREATININE 1.35* 1.47* 1.17* 1.28*  CALCIUM 9.2 8.9 8.3* 8.3*  MG  --   --   --  1.9   GFR: Estimated  Creatinine Clearance: 34.4 mL/min (A) (by C-G formula based on SCr of 1.28 mg/dL (H)). Liver Function Tests: Recent Labs  Lab 08/01/19 0621 08/02/19 0715  AST 36 28  ALT 50* 40  ALKPHOS 280* 233*  BILITOT 0.6 0.6  PROT 7.6 7.7  ALBUMIN 3.8 3.8   Recent Labs  Lab 08/01/19 0621 08/02/19 0715  LIPASE 66* 64*   No results for input(s): AMMONIA in the last 168 hours. Coagulation Profile: No results for input(s): INR, PROTIME in the last 168 hours. Cardiac Enzymes: No results for input(s): CKTOTAL, CKMB, CKMBINDEX, TROPONINI in the last 168 hours. BNP (last 3 results) No results for input(s): PROBNP in the last 8760 hours. HbA1C: No results for input(s): HGBA1C in the last 72 hours. CBG: Recent Labs  Lab 08/03/19 1652 08/04/19 0958 08/04/19 1213 08/04/19 1649 08/04/19 2147  GLUCAP 167* 213* 175* 170* 249*   Lipid Profile: No results for input(s): CHOL, HDL, LDLCALC, TRIG, CHOLHDL, LDLDIRECT in the last 72 hours. Thyroid Function Tests: No results for input(s): TSH, T4TOTAL, FREET4, T3FREE, THYROIDAB in the last 72 hours. Anemia Panel: No results for input(s): VITAMINB12, FOLATE, FERRITIN, TIBC, IRON, RETICCTPCT in the last 72 hours. Sepsis Labs: No results for input(s): PROCALCITON, LATICACIDVEN in the last 168 hours.  Recent Results (from the past 240 hour(s))  SARS Coronavirus 2 by RT PCR (hospital order, performed in [facility] hospital lab) Nasopharyngeal Nasopharyngeal Swab     Status: None   Collection Time: 08/02/19  2:58 PM   Specimen: Nasopharyngeal Swab  Result Value Ref Range Status   SARS Coronavirus 2 NEGATIVE NEGATIVE Final    Comment: (NOTE) SARS-CoV-2 target nucleic acids are NOT DETECTED.  The SARS-CoV-2 RNA is generally detectable in upper and lower respiratory specimens during the acute phase of infection. The lowest concentration of SARS-CoV-2 viral copies this assay can detect is 250 copies / mL. A negative result does not preclude SARS-CoV-2  infection and should not be used as the sole basis for treatment or other patient management decisions.  A negative result may occur with improper specimen collection / handling, submission of specimen other than nasopharyngeal swab, presence of viral mutation(s) within the areas targeted by this assay, and inadequate number of viral copies (<250 copies / mL). A negative result must be combined with clinical observations, patient history, and epidemiological information.  Fact Sheet for Patients:   StrictlyIdeas.no  Fact Sheet for Healthcare Providers: BankingDealers.co.za  This test is not yet approved or  cleared by the [place] FDA and has been authorized for detection and/or diagnosis of SARS-CoV-2 by FDA under an Emergency Use Authorization (EUA).  This EUA will remain in effect (meaning this test can be used) for the duration of the COVID-19 declaration under Section 564(b)(1) of the Act, 21 U.S.C. section 360bbb-3(b)(1), unless the authorization is terminated or revoked sooner.  Performed at [facility], [address], [place], [place] [address]  Radiology Studies: No results found.   Scheduled Meds:  amLODipine  10 mg Oral Daily   docusate sodium  100 mg Oral BID   enoxaparin (LOVENOX) injection  30 mg Subcutaneous Q24H   famotidine  10 mg Oral BID   insulin aspart  0-15 Units Subcutaneous TID WC   insulin glargine  15 Units Subcutaneous QHS   lipase/protease/amylase  72,000 Units Oral TID WC   pantoprazole  40 mg Oral Daily   polyethylene glycol  17 g Oral QHS   pregabalin  75 mg Oral BID   rosuvastatin  40 mg Oral BID   sodium chloride flush  3 mL Intravenous Once   zolpidem  5 mg Oral QHS   Continuous Infusions:   LOS: 2 days     Enzo Bi, MD [facility] If 7PM-7AM, please contact night-coverage 08/05/2019, 3:43 AM

## 2019-08-04 LAB — BASIC METABOLIC PANEL
Anion gap: 7 (ref 5–15)
BUN: 12 mg/dL (ref 8–23)
CO2: 22 mmol/L (ref 22–32)
Calcium: 8.3 mg/dL — ABNORMAL LOW (ref 8.9–10.3)
Chloride: 108 mmol/L (ref 98–111)
Creatinine, Ser: 1.28 mg/dL — ABNORMAL HIGH (ref 0.44–1.00)
GFR calc Af Amer: 52 mL/min — ABNORMAL LOW (ref >60)
GFR calc non Af Amer: 45 mL/min — ABNORMAL LOW (ref >60)
Glucose, Bld: 245 mg/dL — ABNORMAL HIGH (ref 70–99)
Potassium: 4 mmol/L (ref 3.5–5.1)
Sodium: 137 mmol/L (ref 135–145)

## 2019-08-04 LAB — CBC
HCT: 32.9 % — ABNORMAL LOW (ref 36.0–46.0)
Hemoglobin: 11.2 g/dL — ABNORMAL LOW (ref 12.0–15.0)
MCH: 28.8 pg (ref 26.0–34.0)
MCHC: 34 g/dL (ref 30.0–36.0)
MCV: 84.6 fL (ref 80.0–100.0)
Platelets: 227 10*3/uL (ref 150–400)
RBC: 3.89 MIL/uL (ref 3.87–5.11)
RDW: 11.8 % (ref 11.5–15.5)
WBC: 8 10*3/uL (ref 4.0–10.5)
nRBC: 0 % (ref 0.0–0.2)

## 2019-08-04 LAB — GLUCOSE, CAPILLARY
Glucose-Capillary: 213 mg/dL — ABNORMAL HIGH (ref 70–99)
Glucose-Capillary: 175 mg/dL — ABNORMAL HIGH (ref 70–99)
Glucose-Capillary: 170 mg/dL — ABNORMAL HIGH (ref 70–99)
Glucose-Capillary: 249 mg/dL — ABNORMAL HIGH (ref 70–99)

## 2019-08-04 LAB — MAGNESIUM: Magnesium: 1.9 mg/dL (ref 1.7–2.4)

## 2019-08-04 MED ORDER — INSULIN ASPART 100 UNIT/ML ~~LOC~~ SOLN
0.0000 [IU] | Freq: Three times a day (TID) | SUBCUTANEOUS | Status: DC
  Administered 2019-08-04 (×2): 3 [IU] via SUBCUTANEOUS
  Administered 2019-08-05: 14:00:00 5 [IU] via SUBCUTANEOUS
  Administered 2019-08-05: 3 [IU] via SUBCUTANEOUS
  Filled 2019-08-04 (×4): qty 1

## 2019-08-04 MED ORDER — ACETAMINOPHEN 500 MG PO TABS: 1000.0000 mg | ORAL_TABLET | Freq: Three times a day (TID) | ORAL | Status: DC | PRN

## 2019-08-04 MED ORDER — GUAIFENESIN-DM 100-10 MG/5ML PO SYRP: 10.0000 mL | ORAL_SOLUTION | Freq: Four times a day (QID) | ORAL | Status: DC | PRN

## 2019-08-04 MED ORDER — ALUM & MAG HYDROXIDE-SIMETH 200-200-20 MG/5ML PO SUSP
15.0000 mL | Freq: Four times a day (QID) | ORAL | Status: DC | PRN
  Administered 2019-08-04: 22:00:00 15 mL via ORAL
  Filled 2019-08-04: qty 30

## 2019-08-04 MED ORDER — CALCIUM CARBONATE ANTACID 500 MG PO CHEW
1.0000 | CHEWABLE_TABLET | Freq: Three times a day (TID) | ORAL | Status: DC | PRN
  Administered 2019-08-04: 200 mg via ORAL
  Filled 2019-08-04: qty 1

## 2019-08-04 NOTE — TOC Initial Note (Signed)
Transition of Care [facility]) - Initial/Assessment Note    Patient Details  Name: Karen [name] MRN: [id number] Date of Birth: Dec 11, 1955  [facility]) CM/SW Contact:    Shelbie Ammons, RN Phone Number: 08/04/2019, 9:41 AM  Clinical Narrative:    Patient admitted to hospital due to abdominal pain. She lives at home with her boyfriend who provides her transportation, she uses Medicaid transportation as well at times. She has services 7 days a week through [facility]. She uses a cane and a walker at home at times she reports only if she needs them. Patient reports her PCP is Dr. Humphrey Rolls and she uses [facility] for her medications, she denies any problems with affording her medications.           Expected Discharge Plan: Home/Self Care Barriers to Discharge: No Barriers Identified   Patient Goals and CMS Choice        Expected Discharge Plan and Services Expected Discharge Plan: Home/Self Care       Living arrangements for the past 2 months: Single Family Home                                      Prior Living Arrangements/Services Living arrangements for the past 2 months: Single Family Home Lives with:: Significant Other Patient language and need for interpreter reviewed:: Yes Do you feel safe going back to the place where you live?: Yes      Need for Family Participation in Patient Care: Yes (Comment) Care giver support system in place?: Yes (comment)   Criminal Activity/Legal Involvement Pertinent to Current Situation/Hospitalization: No - Comment as needed  Activities of Daily Living Home Assistive Devices/Equipment: None ADL Screening (condition at time of admission) Patient's cognitive ability adequate to safely complete daily activities?: Yes Is the patient deaf or have difficulty hearing?: No Does the patient have difficulty seeing, even when wearing glasses/contacts?: No Does the patient have difficulty concentrating, remembering, or making decisions?:  No Patient able to express need for assistance with ADLs?: Yes Does the patient have difficulty dressing or bathing?: No Independently performs ADLs?: Yes (appropriate for developmental age) Does the patient have difficulty walking or climbing stairs?: No Weakness of Legs: None Weakness of Arms/Hands: None  Permission Sought/Granted                  Emotional Assessment       Orientation: : Oriented to Self, Oriented to Place, Oriented to  Time, Oriented to Situation Alcohol / Substance Use: Not Applicable Psych Involvement: No (comment)  Admission diagnosis:  Recurrent pancreatitis [K85.90] Abdominal pain [R10.9] Chronic pancreatitis ([place]) [K86.1] Patient Active Problem List   Diagnosis Date Noted  . Itching   . Diabetic polyneuropathy associated with type 2 diabetes mellitus ([place])   . Leukocytosis 06/13/2019  . Chronic kidney disease (CKD), stage II (mild)   . CKD (chronic kidney disease), stage IIIa 03/15/2019  . HTN (hypertension) 03/15/2019  . Type II diabetes mellitus with renal manifestations ([place]) 03/15/2019  . Headache 02/26/2019  . Dizziness 02/26/2019  . Hyperglycemia due to type 2 diabetes mellitus ([place]) 01/30/2019  . Acute pancreatitis 01/30/2019  . Constipation   . Acute on chronic pancreatitis ([place]) 01/25/2019  . Acute pain of left shoulder   . Adhesive capsulitis of left shoulder   . Abnormal LFTs 01/24/2019  . Cough 01/24/2019  . Hypertension   . HLD (hyperlipidemia)   .  Type 2 diabetes mellitus with hyperlipidemia ([place])   . GERD (gastroesophageal reflux disease)   . Acute kidney injury superimposed on CKD ([place])   . Pancreatitis, recurrent   . Chronic pancreatitis ([place]) 09/25/2018  . 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 Coffee ground emesis   . Upper GI bleed 11/11/2017  . Pancreatitis, acute 01/03/2017  . ETOH abuse 01/03/2017  . UTI (urinary tract infection) 01/03/2017  . Nausea and vomiting   . Esophageal candidiasis ([place])   .  Intractable vomiting with nausea   . Hypertensive urgency 04/21/2016  . Abdominal pain   . Nausea vomiting and diarrhea   . Smoker   . Poorly controlled type 2 diabetes mellitus ([place])   . Acute renal insufficiency 10/29/2015  . Elevated troponin 10/29/2015  . Urinary tract infection without hematuria 10/29/2015  . Left flank pain 10/29/2015  . Malignant essential hypertension 10/29/2015  . DKA (diabetic ketoacidoses) ([place]) 02/17/2015  . Emphysematous cystitis    PCP:  Lavera Guise, MD Pharmacy:   [place], [place] [address] Phone: [phone] Fax: [phone]     Social Determinants of Health (SDOH) Interventions    Readmission Risk Interventions Readmission Risk Prevention Plan 08/04/2019 06/15/2019  Transportation Screening Complete -  PCP or Specialist Appt within 3-5 Days Complete -  Social Work Consult for [place] Planning/Counseling Complete -  [place] Screening Not Applicable Not Applicable  Medication Review Press photographer) Complete Complete  Some recent data might be hidden

## 2019-08-04 NOTE — Progress Notes (Signed)
PROGRESS NOTE    Karen [name]  OEV:[id number] DOB: 1955-04-17 DOA: 08/02/2019 PCP: Lavera Guise, MD    Assessment & Plan:   Principal Problem:   Abdominal pain Active Problems:   Chronic pancreatitis ([place])   Hypertension   HLD (hyperlipidemia)   GERD (gastroesophageal reflux disease)   Acute on chronic pancreatitis ([place])   CKD (chronic kidney disease), stage IIIa   Type II diabetes mellitus with renal manifestations (H[facility])    Karen [name] is a 64 y.o. female with medical history significant of alcohol abuse in remission, alcoholic pancreatitis, hyperlipidemia, hypertension, diabetes mellitus, depression, asthma, DKA, emphysematous cystitis, GI bleeding, CKD stage III, who presented with nausea, vomiting, abdominal pain.    N/V and Abdominal pain 2/2 chronic pancreatitis  --lipase is64, similar to prior, and appears to be her baseline, so not likely represent acute pancreatitis.   PLAN: --d/c MIVF now that pt is having liquid diet --advance to Full liquid for dinner --Home Norco for pain, do not increase --continue home Creon --Miralax 34g BID, as pt may also have constipation.  Hx ofETOH abuse In remission in past 4 years.No signs of alcohol withdrawal -Monitor for withdrawal symptoms  HLD (hyperlipidemia): -Crestor  CKD (chronic kidney disease), stage IIIa: Stable. recently baseline creatinine 1.1-1.4. Her creatinine is 1.47, BUN25. Close to baseline -Follow-up BMP  HTN (hypertension) -Continue home amlodipine -prnhydralazine  Type II diabetes mellitus with renal manifestations ([place]):Most recent A1c10.7,poorly controled. Patient is taking NovoLog and Lantusat home. -continue Lantus 15 units nightly -SSI TID  GERD (gastroesophageal reflux disease) -Protonix and Pepcid  Chronic pain on chronic opioids --continue home dose of Norco --continue home Lyrica --increase bowel regimen to avoid constipation    DVT prophylaxis: Lovenox  SQ Code Status: Full code  Family Communication:  Status is: inpatient Dispo:   The patient is from: home Anticipated d/c is to: home Anticipated d/c date is: 1-2 days Patient currently is not medically stable to d/c due to: abdominal pain with oral intake, need to tolerate diet before discharge.   Subjective and Interval History:  Pt complained of more abdominal pain and bloating today, but asked to be advanced to Full liquid diet.   Objective: Vitals:   08/04/19 1214 08/04/19 1652 08/04/19 1937 08/04/19 2333  BP: (!) 147/73 124/75 (!) 127/63 (!) 133/68  Pulse: 64 67 67 72  Resp: 16 14  15   Temp: 98.4 F (36.9 C) 98.5 F (36.9 C) 98.9 F (37.2 C) 98.4 F (36.9 C)  TempSrc: Oral Oral Oral Oral  SpO2: 98% 92% 99% 97%  Weight:      Height:        Intake/Output Summary (Last 24 hours) at 08/05/2019 0356 Last data filed at 08/04/2019 1830 Gross per 24 hour  Intake 396 ml  Output --  Net 396 ml   Filed Weights   08/02/19 0712  Weight: 54.4 kg    Examination:   Constitutional: NAD, AAOx3 HEENT: conjunctivae and lids normal, EOMI CV: RRR no M,R,G. Distal pulses +2.  No cyanosis.   RESP: CTA B/L, normal respiratory effort  GI: +BS, NTND Extremities: No effusions, edema, or tenderness in BLE SKIN: warm, dry and intact Neuro: II - XII grossly intact.  Sensation intact    Data Reviewed: I have personally reviewed following labs and imaging studies  CBC: Recent Labs  Lab 08/01/19 0621 08/02/19 0715 08/03/19 0507 08/04/19 1007  WBC 9.7 7.1 8.5 8.0  NEUTROABS 6.1  --   --   --  HGB 12.0 11.7* 11.5* 11.2*  HCT 35.2* 35.3* 33.9* 32.9*  MCV 84.2 85.7 83.7 84.6  PLT 241 244 241 841   Basic Metabolic Panel: Recent Labs  Lab 08/01/19 0621 08/02/19 0715 08/03/19 0507 08/04/19 1007  NA 139 138 143 137  K 4.6 4.8 3.8 4.0  CL 103 102 108 108  CO2 25 27 24 22   GLUCOSE 244* 345* 83 245*  BUN 28* 25* 19 12  CREATININE 1.35* 1.47* 1.17* 1.28*  CALCIUM 9.2 8.9  8.3* 8.3*  MG  --   --   --  1.9   GFR: Estimated Creatinine Clearance: 34.4 mL/min (A) (by C-G formula based on SCr of 1.28 mg/dL (H)). Liver Function Tests: Recent Labs  Lab 08/01/19 0621 08/02/19 0715  AST 36 28  ALT 50* 40  ALKPHOS 280* 233*  BILITOT 0.6 0.6  PROT 7.6 7.7  ALBUMIN 3.8 3.8   Recent Labs  Lab 08/01/19 0621 08/02/19 0715  LIPASE 66* 64*   No results for input(s): AMMONIA in the last 168 hours. Coagulation Profile: No results for input(s): INR, PROTIME in the last 168 hours. Cardiac Enzymes: No results for input(s): CKTOTAL, CKMB, CKMBINDEX, TROPONINI in the last 168 hours. BNP (last 3 results) No results for input(s): PROBNP in the last 8760 hours. HbA1C: No results for input(s): HGBA1C in the last 72 hours. CBG: Recent Labs  Lab 08/03/19 1652 08/04/19 0958 08/04/19 1213 08/04/19 1649 08/04/19 2147  GLUCAP 167* 213* 175* 170* 249*   Lipid Profile: No results for input(s): CHOL, HDL, LDLCALC, TRIG, CHOLHDL, LDLDIRECT in the last 72 hours. Thyroid Function Tests: No results for input(s): TSH, T4TOTAL, FREET4, T3FREE, THYROIDAB in the last 72 hours. Anemia Panel: No results for input(s): VITAMINB12, FOLATE, FERRITIN, TIBC, IRON, RETICCTPCT in the last 72 hours. Sepsis Labs: No results for input(s): PROCALCITON, LATICACIDVEN in the last 168 hours.  Recent Results (from the past 240 hour(s))  SARS Coronavirus 2 by RT PCR (hospital order, performed in [facility] hospital lab) Nasopharyngeal Nasopharyngeal Swab     Status: None   Collection Time: 08/02/19  2:58 PM   Specimen: Nasopharyngeal Swab  Result Value Ref Range Status   SARS Coronavirus 2 NEGATIVE NEGATIVE Final    Comment: (NOTE) SARS-CoV-2 target nucleic acids are NOT DETECTED.  The SARS-CoV-2 RNA is generally detectable in upper and lower respiratory specimens during the acute phase of infection. The lowest concentration of SARS-CoV-2 viral copies this assay can detect is  250 copies / mL. A negative result does not preclude SARS-CoV-2 infection and should not be used as the sole basis for treatment or other patient management decisions.  A negative result may occur with improper specimen collection / handling, submission of specimen other than nasopharyngeal swab, presence of viral mutation(s) within the areas targeted by this assay, and inadequate number of viral copies (<250 copies / mL). A negative result must be combined with clinical observations, patient history, and epidemiological information.  Fact Sheet for Patients:   StrictlyIdeas.no  Fact Sheet for Healthcare Providers: BankingDealers.co.za  This test is not yet approved or  cleared by the [place] FDA and has been authorized for detection and/or diagnosis of SARS-CoV-2 by FDA under an Emergency Use Authorization (EUA).  This EUA will remain in effect (meaning this test can be used) for the duration of the COVID-19 declaration under Section 564(b)(1) of the Act, 21 U.S.C. section 360bbb-3(b)(1), unless the authorization is terminated or revoked sooner.  Performed at [facility], [phone]  [address] [place], [place] [address]       Radiology Studies: No results found.   Scheduled Meds: . amLODipine  10 mg Oral Daily  . docusate sodium  100 mg Oral BID  . enoxaparin (LOVENOX) injection  30 mg Subcutaneous Q24H  . famotidine  10 mg Oral BID  . insulin aspart  0-15 Units Subcutaneous TID WC  . insulin glargine  15 Units Subcutaneous QHS  . lipase/protease/amylase  72,000 Units Oral TID WC  . pantoprazole  40 mg Oral Daily  . polyethylene glycol  34 g Oral BID  . pregabalin  75 mg Oral BID  . rosuvastatin  40 mg Oral BID  . sodium chloride flush  3 mL Intravenous Once  . zolpidem  5 mg Oral QHS   Continuous Infusions:   LOS: 2 days     Enzo Bi, MD [facility] If 7PM-7AM, please contact  night-coverage 08/05/2019, 3:56 AM

## 2019-08-05 LAB — MAGNESIUM: Magnesium: 2 mg/dL (ref 1.7–2.4)

## 2019-08-05 LAB — BASIC METABOLIC PANEL
Anion gap: 10 (ref 5–15)
BUN: 12 mg/dL (ref 8–23)
CO2: 23 mmol/L (ref 22–32)
Calcium: 8.8 mg/dL — ABNORMAL LOW (ref 8.9–10.3)
Chloride: 108 mmol/L (ref 98–111)
Creatinine, Ser: 1.31 mg/dL — ABNORMAL HIGH (ref 0.44–1.00)
GFR calc Af Amer: 50 mL/min — ABNORMAL LOW (ref >60)
GFR calc non Af Amer: 44 mL/min — ABNORMAL LOW (ref >60)
Glucose, Bld: 193 mg/dL — ABNORMAL HIGH (ref 70–99)
Potassium: 4.3 mmol/L (ref 3.5–5.1)
Sodium: 141 mmol/L (ref 135–145)

## 2019-08-05 LAB — GLUCOSE, CAPILLARY
Glucose-Capillary: 167 mg/dL — ABNORMAL HIGH (ref 70–99)
Glucose-Capillary: 213 mg/dL — ABNORMAL HIGH (ref 70–99)

## 2019-08-05 LAB — CBC
HCT: 31.5 % — ABNORMAL LOW (ref 36.0–46.0)
Hemoglobin: 11.1 g/dL — ABNORMAL LOW (ref 12.0–15.0)
MCH: 28.8 pg (ref 26.0–34.0)
MCHC: 35.2 g/dL (ref 30.0–36.0)
MCV: 81.8 fL (ref 80.0–100.0)
Platelets: 221 10*3/uL (ref 150–400)
RBC: 3.85 MIL/uL — ABNORMAL LOW (ref 3.87–5.11)
RDW: 11.9 % (ref 11.5–15.5)
WBC: 13.6 10*3/uL — ABNORMAL HIGH (ref 4.0–10.5)
nRBC: 0 % (ref 0.0–0.2)

## 2019-08-05 MED ORDER — POLYETHYLENE GLYCOL 3350 17 G PO PACK
34.0000 g | PACK | Freq: Two times a day (BID) | ORAL | Status: DC
  Administered 2019-08-05: 10:00:00 34 g via ORAL
  Filled 2019-08-05: qty 2

## 2019-08-05 MED ORDER — PREGABALIN 75 MG PO CAPS: 75.0000 mg | ORAL_CAPSULE | Freq: Two times a day (BID) | ORAL | 0 refills | Status: DC

## 2019-08-05 MED ORDER — ROSUVASTATIN CALCIUM 20 MG PO TABS: 20.0000 mg | ORAL_TABLET | Freq: Every day | ORAL | 0 refills | Status: DC

## 2019-08-05 MED ORDER — ENOXAPARIN SODIUM 40 MG/0.4ML ~~LOC~~ SOLN: 40.0000 mg | SUBCUTANEOUS | Status: DC

## 2019-08-05 NOTE — Discharge Summary (Signed)
Physician Discharge Summary  Karen [name]:[id number] DOB: December 12, 1955 DOA: 08/02/2019  PCP: Lavera Guise, MD  Admit date: 08/02/2019 Discharge date: 08/05/2019  Admitted From: Home Disposition:  Home  Recommendations for Outpatient Follow-up:  1. Follow up with PCP in 1-2 weeks 2. Please obtain BMP/CBC in one week 3. Please follow up on the following pending results: None  Home Health:No Equipment/Devices:None Discharge Condition: Stable CODE STATUS: Full Diet recommendation: Heart Healthy / Carb Modified   Brief/Interim Summary: Karen [name] a 64 y.o.femalewith medical history significant ofalcohol abuse in remission, alcoholic pancreatitis, hyperlipidemia, hypertension, diabetes mellitus, depression, asthma, DKA, emphysematous cystitis, GI bleeding, CKD stage III, who presented with nausea, vomiting, abdominal pain.  Lipase was elevated at 64, appears to be at her baseline due to her history of chronic pancreatitis.  Patient was initially treated with IV fluid, we advanced her diet slowly and she was able to tolerate soft diet before discharge. Patient has an history of alcohol abuse, in remission for the past 4 years. Her CKD stage IIIa was stable.  Patient was on high-dose Lyrica and taking Crestor 40 mg twice daily.  Her Lyrica dose was decreased to 75 mg twice daily and Crestor to 20 mg daily on discharge and she was advised to follow-up with her primary care provider for further management.  Patient takes opioids and Lyrica for chronic pain.  There was no exacerbation of pain with decreased dose of Lyrica.  Patient has poorly controlled diabetes with A1c of 10.7.  She will continue with her home medications and follow-up with her primary care physician for further management.  She will continue with rest of her home meds.  Discharge Diagnoses:  Principal Problem:   Abdominal pain Active Problems:   Chronic pancreatitis (HCC)   Hypertension   HLD (hyperlipidemia)    GERD (gastroesophageal reflux disease)   Acute on chronic pancreatitis (HCC)   CKD (chronic kidney disease), stage IIIa   Type II diabetes mellitus with renal manifestations [facility])   Discharge Instructions  Discharge Instructions    Diet - low sodium heart healthy   Complete by: As directed    Discharge instructions   Complete by: As directed    It was pleasure taking care of you. We decreased the dose of Lyrica to 75 mg twice daily, please follow-up with your primary care provider for further management. Keep yourself well-hydrated and slowly advance your diet.   Increase activity slowly   Complete by: As directed      Allergies as of 08/05/2019      Reactions   Penicillins Anaphylaxis   Has patient had a PCN reaction causing immediate rash, facial/tongue/throat swelling, SOB or lightheadedness with hypotension: Yes Has patient had a PCN reaction causing severe rash involving mucus membranes or skin necrosis: No Has patient had a PCN reaction that required hospitalization No Has patient had a PCN reaction occurring within the last 10 years: No If all of the above answers are "NO", then may proceed with Cephalosporin use.   Reglan [metoclopramide] Other (See Comments)   Hypotension, shortness of breath   Fentanyl    Rash   Percocet [oxycodone-acetaminophen] Rash   plain tylenol can also make itch?      Medication List    TAKE these medications   amLODipine 10 MG tablet Commonly known as: NORVASC Take 10 mg by mouth daily.   cyclobenzaprine 5 MG tablet Commonly known as: FLEXERIL Take 5 mg by mouth 2 (two) times daily as needed  for muscle spasms.   docusate sodium 100 MG capsule Commonly known as: COLACE Take 100 mg by mouth 2 (two) times daily.   famotidine 20 MG tablet Commonly known as: PEPCID Take 1 tablet (20 mg total) by mouth 2 (two) times daily.   HYDROcodone-acetaminophen 5-325 MG tablet Commonly known as: NORCO/VICODIN One tablet every four hours as  needed for three days then go back to you usual one tablet every six hours What changed:   how much to take  how to take this  when to take this  reasons to take this  additional instructions   insulin aspart 100 UNIT/ML FlexPen Commonly known as: NOVOLOG Inject 2 Units into the skin 3 (three) times daily with meals.   insulin glargine 100 unit/mL Sopn Commonly known as: LANTUS Inject 0.2 mLs (20 Units total) into the skin at bedtime.   Insulin Pen Needle 29G X 12MM Misc 1 Dose by Does not apply route 3 (three) times daily before meals.   lipase/protease/amylase 36000 UNITS Cpep capsule Commonly known as: Creon Take 2 capsules (72,000 Units total) by mouth 3 (three) times daily with meals.   omeprazole 40 MG capsule Commonly known as: PRILOSEC Take 40 mg by mouth in the morning and at bedtime.   ondansetron 4 MG disintegrating tablet Commonly known as: Zofran ODT Take 1 tablet (4 mg total) by mouth every 8 (eight) hours as needed for nausea or vomiting.   Polyethylene Glycol 3350 Powd Take 17 g by mouth at bedtime. Mix 17 grams in 4-8 oz of liquid and drink   pregabalin 75 MG capsule Commonly known as: LYRICA Take 1 capsule (75 mg total) by mouth 2 (two) times daily. What changed:   medication strength  how much to take   QUEtiapine 25 MG tablet Commonly known as: SEROQUEL Take 25 mg by mouth as needed (DEPRESSION).   rosuvastatin 20 MG tablet Commonly known as: CRESTOR Take 1 tablet (20 mg total) by mouth daily. What changed:   how much to take  when to take this   simethicone 80 MG chewable tablet Commonly known as: MYLICON Chew 1 tablet (80 mg total) by mouth 4 (four) times daily.   triamcinolone cream 0.5 % Commonly known as: KENALOG Apply twice a day to back of thighs   zolpidem 10 MG tablet Commonly known as: AMBIEN Take 1 tablet by mouth at bedtime.       Follow-up Information    Lavera Guise, MD Follow up in 7 day(s).   Specialty:  Internal Medicine Why: Please follow up within 5-7 days of discharge.  Contact information: [place] [address] [phone]              Allergies  Allergen Reactions  . Penicillins Anaphylaxis    Has patient had a PCN reaction causing immediate rash, facial/tongue/throat swelling, SOB or lightheadedness with hypotension: Yes Has patient had a PCN reaction causing severe rash involving mucus membranes or skin necrosis: No Has patient had a PCN reaction that required hospitalization No Has patient had a PCN reaction occurring within the last 10 years: No If all of the above answers are "NO", then may proceed with Cephalosporin use.  . Reglan [Metoclopramide] Other (See Comments)    Hypotension, shortness of breath  . Fentanyl     Rash   . Percocet [Oxycodone-Acetaminophen] Rash    plain tylenol can also make itch?    Consultations:  None  Procedures/Studies: US Abdomen Complete  Result Date:  08/01/2019 CLINICAL DATA:  Upper abdominal pain. Elevated alkaline phosphatase. History of pancreatitis. EXAM: ABDOMEN ULTRASOUND COMPLETE COMPARISON:  CT abdomen and pelvis 07/01/2019. Right upper quadrant abdominal ultrasound 03/15/2019. FINDINGS: Gallbladder: No gallstones or wall thickening visualized. No sonographic Murphy sign noted by sonographer. Common bile duct: Diameter: Mildly dilated at 9 mm (10 mm on the 03/15/2019 ultrasound) Liver: Mildly heterogeneous parenchymal echogenicity diffusely, nonspecific. 12 mm hyperechoic lesion in the posterior right hepatic lobe, unchanged from the prior ultrasound. Portal vein is patent on color Doppler imaging with normal direction of blood flow towards the liver. IVC: No abnormality visualized. Pancreas: Visualized portion unremarkable. Spleen: Size and appearance within normal limits. Right Kidney: Length: 9.8 cm. Echogenicity within normal limits. No mass or hydronephrosis visualized. Left Kidney: Length: 10.6 cm.  Echogenicity within normal limits. No mass or hydronephrosis visualized. Abdominal aorta: Atherosclerosis. Distal abdominal aorta obscured by bowel gas. No aneurysm visualized in the more proximal abdominal aorta. Other findings: None. IMPRESSION: 1. No acute finding. 2. Mild common bile duct dilatation, stable to slightly less than on 03/15/2019. 3. Unchanged 12 mm hyperechoic liver lesion, possibly a hemangioma. Electronically Signed   By: Logan Bores M.D.   On: 08/01/2019 13:59     Subjective: Patient was feeling better when seen today.  No more vomiting.  Continues to have mild epigastric discomfort.  Had a bowel movement yesterday.  Discharge Exam: Vitals:   08/05/19 0815 08/05/19 1240  BP: (!) 129/66 (!) 131/64  Pulse: 68 79  Resp: 18 16  Temp: 98.2 F (36.8 C) 98.8 F (37.1 C)  SpO2: 93% 97%   Vitals:   08/04/19 2333 08/05/19 0405 08/05/19 0815 08/05/19 1240  BP: (!) 133/68 (!) 132/62 (!) 129/66 (!) 131/64  Pulse: 72 84 68 79  Resp: 15 15 18 16   Temp: 98.4 F (36.9 C) 98.6 F (37 C) 98.2 F (36.8 C) 98.8 F (37.1 C)  TempSrc: Oral Oral Oral Oral  SpO2: 97% 93% 93% 97%  Weight:      Height:        General: Pt is alert, awake, not in acute distress Cardiovascular: RRR, S1/S2 +, no rubs, no gallops Respiratory: CTA bilaterally, no wheezing, no rhonchi Abdominal: Soft, NT, ND, bowel sounds + Extremities: no edema, no cyanosis   The results of significant diagnostics from this hospitalization (including imaging, microbiology, ancillary and laboratory) are listed below for reference.    Microbiology: Recent Results (from the past 240 hour(s))  SARS Coronavirus 2 by RT PCR (hospital order, performed in [facility] hospital lab) Nasopharyngeal Nasopharyngeal Swab     Status: None   Collection Time: 08/02/19  2:58 PM   Specimen: Nasopharyngeal Swab  Result Value Ref Range Status   SARS Coronavirus 2 NEGATIVE NEGATIVE Final    Comment: (NOTE) SARS-CoV-2 target nucleic  acids are NOT DETECTED.  The SARS-CoV-2 RNA is generally detectable in upper and lower respiratory specimens during the acute phase of infection. The lowest concentration of SARS-CoV-2 viral copies this assay can detect is 250 copies / mL. A negative result does not preclude SARS-CoV-2 infection and should not be used as the sole basis for treatment or other patient management decisions.  A negative result may occur with improper specimen collection / handling, submission of specimen other than nasopharyngeal swab, presence of viral mutation(s) within the areas targeted by this assay, and inadequate number of viral copies (<250 copies / mL). A negative result must be combined with clinical observations, patient history, and epidemiological information.  Fact Sheet for Patients:   StrictlyIdeas.no  Fact Sheet for Healthcare Providers: BankingDealers.co.za  This test is not yet approved or  cleared by the [place] FDA and has been authorized for detection and/or diagnosis of SARS-CoV-2 by FDA under an Emergency Use Authorization (EUA).  This EUA will remain in effect (meaning this test can be used) for the duration of the COVID-19 declaration under Section 564(b)(1) of the Act, 21 U.S.C. section 360bbb-3(b)(1), unless the authorization is terminated or revoked sooner.  Performed at [facility], [address] [place], [place] [address]      Labs: BNP (last 3 results) Recent Labs    06/13/19 0729  BNP 93.2   Basic Metabolic Panel: Recent Labs  Lab 08/01/19 0621 08/02/19 0715 08/03/19 0507 08/04/19 1007 08/05/19 0654  NA 139 138 143 137 141  K 4.6 4.8 3.8 4.0 4.3  CL 103 102 108 108 108  CO2 25 27 24 22 23   GLUCOSE 244* 345* 83 245* 193*  BUN 28* 25* 19 12 12   CREATININE 1.35* 1.47* 1.17* 1.28* 1.31*  CALCIUM 9.2 8.9 8.3* 8.3* 8.8*  MG  --   --   --  1.9 2.0   Liver Function Tests: Recent Labs  Lab  08/01/19 0621 08/02/19 0715  AST 36 28  ALT 50* 40  ALKPHOS 280* 233*  BILITOT 0.6 0.6  PROT 7.6 7.7  ALBUMIN 3.8 3.8   Recent Labs  Lab 08/01/19 0621 08/02/19 0715  LIPASE 66* 64*   No results for input(s): AMMONIA in the last 168 hours. CBC: Recent Labs  Lab 08/01/19 0621 08/02/19 0715 08/03/19 0507 08/04/19 1007 08/05/19 0654  WBC 9.7 7.1 8.5 8.0 13.6*  NEUTROABS 6.1  --   --   --   --   HGB 12.0 11.7* 11.5* 11.2* 11.1*  HCT 35.2* 35.3* 33.9* 32.9* 31.5*  MCV 84.2 85.7 83.7 84.6 81.8  PLT 241 244 241 227 221   Cardiac Enzymes: No results for input(s): CKTOTAL, CKMB, CKMBINDEX, TROPONINI in the last 168 hours. BNP: Invalid input(s): POCBNP CBG: Recent Labs  Lab 08/04/19 1213 08/04/19 1649 08/04/19 2147 08/05/19 0814 08/05/19 1240  GLUCAP 175* 170* 249* 167* 213*   D-Dimer No results for input(s): DDIMER in the last 72 hours. Hgb A1c No results for input(s): HGBA1C in the last 72 hours. Lipid Profile No results for input(s): CHOL, HDL, LDLCALC, TRIG, CHOLHDL, LDLDIRECT in the last 72 hours. Thyroid function studies No results for input(s): TSH, T4TOTAL, T3FREE, THYROIDAB in the last 72 hours.  Invalid input(s): FREET3 Anemia work up No results for input(s): VITAMINB12, FOLATE, FERRITIN, TIBC, IRON, RETICCTPCT in the last 72 hours. Urinalysis    Component Value Date/Time   COLORURINE STRAW (A) 08/02/2019 0714   APPEARANCEUR HAZY (A) 08/02/2019 0714   APPEARANCEUR Hazy 08/11/2013 0729   LABSPEC 1.006 08/02/2019 0714   LABSPEC 1.030 08/11/2013 0729   PHURINE 7.0 08/02/2019 0714   GLUCOSEU 50 (A) 08/02/2019 0714   GLUCOSEU >=500 08/11/2013 0729   HGBUR NEGATIVE 08/02/2019 0714   BILIRUBINUR NEGATIVE 08/02/2019 0714   BILIRUBINUR Negative 08/11/2013 0729   KETONESUR NEGATIVE 08/02/2019 0714   PROTEINUR 100 (A) 08/02/2019 0714   NITRITE NEGATIVE 08/02/2019 0714   LEUKOCYTESUR MODERATE (A) 08/02/2019 0714   LEUKOCYTESUR Negative 08/11/2013 0729    Sepsis Labs Invalid input(s): PROCALCITONIN,  WBC,  LACTICIDVEN Microbiology Recent Results (from the past 240 hour(s))  SARS Coronavirus 2 by RT PCR (hospital order, performed in [place] hospital  lab) Nasopharyngeal Nasopharyngeal Swab     Status: None   Collection Time: 08/02/19  2:58 PM   Specimen: Nasopharyngeal Swab  Result Value Ref Range Status   SARS Coronavirus 2 NEGATIVE NEGATIVE Final    Comment: (NOTE) SARS-CoV-2 target nucleic acids are NOT DETECTED.  The SARS-CoV-2 RNA is generally detectable in upper and lower respiratory specimens during the acute phase of infection. The lowest concentration of SARS-CoV-2 viral copies this assay can detect is 250 copies / mL. A negative result does not preclude SARS-CoV-2 infection and should not be used as the sole basis for treatment or other patient management decisions.  A negative result may occur with improper specimen collection / handling, submission of specimen other than nasopharyngeal swab, presence of viral mutation(s) within the areas targeted by this assay, and inadequate number of viral copies (<250 copies / mL). A negative result must be combined with clinical observations, patient history, and epidemiological information.  Fact Sheet for Patients:   StrictlyIdeas.no  Fact Sheet for Healthcare Providers: BankingDealers.co.za  This test is not yet approved or  cleared by the [place] FDA and has been authorized for detection and/or diagnosis of SARS-CoV-2 by FDA under an Emergency Use Authorization (EUA).  This EUA will remain in effect (meaning this test can be used) for the duration of the COVID-19 declaration under Section 564(b)(1) of the Act, 21 U.S.C. section 360bbb-3(b)(1), unless the authorization is terminated or revoked sooner.  Performed at [facility], [address] [place], [place] [address]     Time coordinating discharge:  Over 30 minutes  SIGNED:  Lorella Nimrod, MD  [facility] 08/05/2019, 3:12 PM  If 7PM-7AM, please contact night-coverage [email or web address]  This record has been created using Systems analyst. Errors have been sought and corrected,but may not always be located. Such creation errors do not reflect on the standard of care.

## 2019-08-21 ENCOUNTER — Ambulatory Visit: Payer: Medicaid Other | Attending: Internal Medicine

## 2019-08-21 DIAGNOSIS — Z23 Encounter for immunization: Secondary | ICD-10-CM

## 2019-08-21 NOTE — Progress Notes (Signed)
   Covid-19 Vaccination Clinic  Name:  HSER BELANGER    MRN: [id number] DOB: 17-Sep-1955  08/21/2019  Ms. Crossman was observed post Covid-19 immunization for 30 minutes based on pre-vaccination screening without incident. She was provided with Vaccine Information Sheet and instruction to access the V-Safe system.   Ms. Scheib was instructed to call 911 with any severe reactions post vaccine: Marland Kitchen Difficulty breathing  . Swelling of face and throat  . A fast heartbeat  . A bad rash all over body  . Dizziness and weakness   Immunizations Administered    Name Date Dose VIS Date Route   Pfizer COVID-19 Vaccine 08/21/2019 12:13 PM 0.3 mL 03/01/2018 Intramuscular   Manufacturer: [employer], [employer]   Lot: [id number]   [place]: [id number]

## 2019-08-31 DIAGNOSIS — E1136 Type 2 diabetes mellitus with diabetic cataract: Secondary | ICD-10-CM

## 2019-08-31 HISTORY — DX: Type 2 diabetes mellitus with diabetic cataract: E11.36

## 2019-10-14 ENCOUNTER — Emergency Department
Admission: EM | Admit: 2019-10-14 | Discharge: 2019-10-14 | Disposition: A | Discharge status: Home / Self Care | Payer: Medicaid Other | Attending: Emergency Medicine | Admitting: Emergency Medicine

## 2019-10-14 ENCOUNTER — Emergency Department: Payer: Medicaid Other

## 2019-10-14 ENCOUNTER — Encounter: Payer: Self-pay | Admitting: Emergency Medicine

## 2019-10-14 ENCOUNTER — Other Ambulatory Visit: Payer: Self-pay

## 2019-10-14 DIAGNOSIS — J45909 Unspecified asthma, uncomplicated: Secondary | ICD-10-CM | POA: Diagnosis not present

## 2019-10-14 DIAGNOSIS — Z20822 Contact with and (suspected) exposure to covid-19: Secondary | ICD-10-CM | POA: Diagnosis not present

## 2019-10-14 DIAGNOSIS — Z79899 Other long term (current) drug therapy: Secondary | ICD-10-CM | POA: Insufficient documentation

## 2019-10-14 DIAGNOSIS — T50905A Adverse effect of unspecified drugs, medicaments and biological substances, initial encounter: Secondary | ICD-10-CM

## 2019-10-14 DIAGNOSIS — N1831 Chronic kidney disease, stage 3a: Secondary | ICD-10-CM | POA: Diagnosis not present

## 2019-10-14 DIAGNOSIS — Z794 Long term (current) use of insulin: Secondary | ICD-10-CM | POA: Diagnosis not present

## 2019-10-14 DIAGNOSIS — I129 Hypertensive chronic kidney disease with stage 1 through stage 4 chronic kidney disease, or unspecified chronic kidney disease: Secondary | ICD-10-CM | POA: Insufficient documentation

## 2019-10-14 DIAGNOSIS — Z87891 Personal history of nicotine dependence: Secondary | ICD-10-CM | POA: Diagnosis not present

## 2019-10-14 DIAGNOSIS — R519 Headache, unspecified: Secondary | ICD-10-CM | POA: Diagnosis present

## 2019-10-14 DIAGNOSIS — I6521 Occlusion and stenosis of right carotid artery: Secondary | ICD-10-CM

## 2019-10-14 DIAGNOSIS — E041 Nontoxic single thyroid nodule: Secondary | ICD-10-CM | POA: Diagnosis not present

## 2019-10-14 DIAGNOSIS — E1129 Type 2 diabetes mellitus with other diabetic kidney complication: Secondary | ICD-10-CM | POA: Insufficient documentation

## 2019-10-14 DIAGNOSIS — T887XXA Unspecified adverse effect of drug or medicament, initial encounter: Secondary | ICD-10-CM | POA: Diagnosis not present

## 2019-10-14 LAB — CBC WITH DIFFERENTIAL/PLATELET
Abs Immature Granulocytes: 0.06 10*3/uL (ref 0.00–0.07)
Basophils Absolute: 0.1 10*3/uL (ref 0.0–0.1)
Basophils Relative: 1 %
Eosinophils Absolute: 0.2 10*3/uL (ref 0.0–0.5)
Eosinophils Relative: 2 %
HCT: 36 % (ref 36.0–46.0)
Hemoglobin: 12.3 g/dL (ref 12.0–15.0)
Immature Granulocytes: 1 %
Lymphocytes Relative: 37 %
Lymphs Abs: 3.5 10*3/uL (ref 0.7–4.0)
MCH: 28.4 pg (ref 26.0–34.0)
MCHC: 34.2 g/dL (ref 30.0–36.0)
MCV: 83.1 fL (ref 80.0–100.0)
Monocytes Absolute: 0.7 10*3/uL (ref 0.1–1.0)
Monocytes Relative: 8 %
Neutro Abs: 4.9 10*3/uL (ref 1.7–7.7)
Neutrophils Relative %: 51 %
Platelets: 264 10*3/uL (ref 150–400)
RBC: 4.33 MIL/uL (ref 3.87–5.11)
RDW: 12.1 % (ref 11.5–15.5)
WBC: 9.4 10*3/uL (ref 4.0–10.5)
nRBC: 0 % (ref 0.0–0.2)

## 2019-10-14 LAB — COMPREHENSIVE METABOLIC PANEL
ALT: 34 U/L (ref 0–44)
AST: 33 U/L (ref 15–41)
Albumin: 4.3 g/dL (ref 3.5–5.0)
Alkaline Phosphatase: 214 U/L — ABNORMAL HIGH (ref 38–126)
Anion gap: 9 (ref 5–15)
BUN: 35 mg/dL — ABNORMAL HIGH (ref 8–23)
CO2: 25 mmol/L (ref 22–32)
Calcium: 9.3 mg/dL (ref 8.9–10.3)
Chloride: 100 mmol/L (ref 98–111)
Creatinine, Ser: 1.63 mg/dL — ABNORMAL HIGH (ref 0.44–1.00)
GFR, Estimated: 33 mL/min — ABNORMAL LOW (ref >60)
Glucose, Bld: 246 mg/dL — ABNORMAL HIGH (ref 70–99)
Potassium: 3.9 mmol/L (ref 3.5–5.1)
Sodium: 134 mmol/L — ABNORMAL LOW (ref 135–145)
Total Bilirubin: 0.7 mg/dL (ref 0.3–1.2)
Total Protein: 8.4 g/dL — ABNORMAL HIGH (ref 6.5–8.1)

## 2019-10-14 LAB — LACTIC ACID, PLASMA: Lactic Acid, Venous: 0.8 mmol/L (ref 0.5–1.9)

## 2019-10-14 LAB — RESPIRATORY PANEL BY RT PCR (FLU A&B, COVID)
Influenza A by PCR: NEGATIVE
Influenza B by PCR: NEGATIVE
SARS Coronavirus 2 by RT PCR: NEGATIVE

## 2019-10-14 MED ORDER — SODIUM CHLORIDE 0.9 % IV SOLN
40.0000 mg | Freq: Once | INTRAVENOUS | Status: AC
  Administered 2019-10-14: 40 mg via INTRAVENOUS
  Filled 2019-10-14: qty 4

## 2019-10-14 MED ORDER — CETIRIZINE HCL 5 MG PO TABS: 5.0000 mg | ORAL_TABLET | Freq: Every day | ORAL | 0 refills | Status: DC

## 2019-10-14 MED ORDER — IOHEXOL 350 MG/ML SOLN
75.0000 mL | Freq: Once | INTRAVENOUS | Status: AC | PRN
  Administered 2019-10-14: 60 mL via INTRAVENOUS
  Filled 2019-10-14: qty 75

## 2019-10-14 MED ORDER — DEXAMETHASONE SODIUM PHOSPHATE 10 MG/ML IJ SOLN
8.0000 mg | Freq: Once | INTRAMUSCULAR | Status: AC
  Administered 2019-10-14: 8 mg via INTRAVENOUS
  Filled 2019-10-14: qty 1

## 2019-10-14 MED ORDER — DIPHENHYDRAMINE HCL 50 MG/ML IJ SOLN
25.0000 mg | Freq: Once | INTRAMUSCULAR | Status: AC
  Administered 2019-10-14: 25 mg via INTRAVENOUS
  Filled 2019-10-14: qty 1

## 2019-10-14 NOTE — ED Provider Notes (Signed)
[facility] Emergency Department Provider Note  ____________________________________________   First MD Initiated Contact with Patient 10/14/19 1449     (approximate)  I have reviewed the triage vital signs and the nursing notes.   HISTORY  Chief Complaint Oral Swelling  HPI Karen [name] is a 64 y.o. female who presents to the emergency department with a chief complaint of tongue swelling.  The patient states that 2 weeks ago she had surgery with ophthalmology for cataracts and was placed on some eyedrops.  This is her only new medication.  Over the last 3 days, she complains of a worsening headache that is focused behind the right eye, increasing tongue swelling and a rash.  She states that the rash comes and goes.  She describes her headache as persistent.  The tongue swelling is bothering her the most and she states that it has made it difficult to get in dentures and makes her "talk funny".  She denies throat swelling but does endorse a intermittent shortness of breath.  She denies chest pain, abdominal pain.  Of note, she does have significant past medical history for smoking, TIA, chronic kidney disease, diabetes, alcoholic pancreatitis, asthma.       Past Medical History:  Diagnosis Date  . Alcoholic pancreatitis   . Asthma   . Depression   . Diabetes mellitus without complication ([place])   . DKA (diabetic ketoacidoses) 02/17/2015  . Emphysematous cystitis   . Hypercholesteremia   . Hypertension   . Hypokalemia     Patient Active Problem List   Diagnosis Date Noted  . Itching   . Diabetic polyneuropathy associated with type 2 diabetes mellitus ([place])   . Leukocytosis 06/13/2019  . Chronic kidney disease (CKD), stage II (mild)   . CKD (chronic kidney disease), stage IIIa 03/15/2019  . HTN (hypertension) 03/15/2019  . Type II diabetes mellitus with renal manifestations ([place]) 03/15/2019  . Headache 02/26/2019  . Dizziness 02/26/2019  .  Hyperglycemia due to type 2 diabetes mellitus ([place]) 01/30/2019  . Acute pancreatitis 01/30/2019  . Constipation   . Acute on chronic pancreatitis ([place]) 01/25/2019  . Acute pain of left shoulder   . Adhesive capsulitis of left shoulder   . Abnormal LFTs 01/24/2019  . Cough 01/24/2019  . Hypertension   . HLD (hyperlipidemia)   . Type 2 diabetes mellitus with hyperlipidemia ([place])   . GERD (gastroesophageal reflux disease)   . Acute kidney injury superimposed on CKD ([place])   . Pancreatitis, recurrent   . Chronic pancreatitis ([place]) 09/25/2018  . 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 Coffee ground emesis   . Upper GI bleed 11/11/2017  . Pancreatitis, acute 01/03/2017  . ETOH abuse 01/03/2017  . UTI (urinary tract infection) 01/03/2017  . Nausea and vomiting   . Esophageal candidiasis ([place])   . Intractable vomiting with nausea   . Hypertensive urgency 04/21/2016  . Abdominal pain   . Nausea vomiting and diarrhea   . Smoker   . Poorly controlled type 2 diabetes mellitus ([place])   . Acute renal insufficiency 10/29/2015  . Elevated troponin 10/29/2015  . Urinary tract infection without hematuria 10/29/2015  . Left flank pain 10/29/2015  . Malignant essential hypertension 10/29/2015  . DKA (diabetic ketoacidoses) 02/17/2015  . Emphysematous cystitis     Past Surgical History:  Procedure Laterality Date  . ABDOMINAL HYSTERECTOMY  1996  . APPENDECTOMY  1997  . ESOPHAGOGASTRODUODENOSCOPY N/A 11/12/2017   Procedure: ESOPHAGOGASTRODUODENOSCOPY (EGD);  Surgeon: Sherri Sear  Reece Levy, MD;  Location: [place];  Service: Gastroenterology;  Laterality: N/A;  . ESOPHAGOGASTRODUODENOSCOPY N/A 09/29/2018   Procedure: ESOPHAGOGASTRODUODENOSCOPY (EGD);  Surgeon: Toledo, Benay Pike, MD;  Location: [facility];  Service: Gastroenterology;  Laterality: N/A;  . ESOPHAGOGASTRODUODENOSCOPY (EGD) WITH PROPOFOL N/A 04/24/2016   Procedure: ESOPHAGOGASTRODUODENOSCOPY (EGD) WITH PROPOFOL;  Surgeon:  [name], MD;  Location: [facility];  Service: Endoscopy;  Laterality: N/A;  . ESOPHAGOGASTRODUODENOSCOPY (EGD) WITH PROPOFOL N/A 01/28/2019   Procedure: ESOPHAGOGASTRODUODENOSCOPY (EGD) WITH PROPOFOL;  Surgeon: Toledo, Benay Pike, MD;  Location: [facility];  Service: Gastroenterology;  Laterality: N/A;  . EYE SURGERY    . HAND SURGERY  1988  . THYROID SURGERY  2013    Prior to Admission medications   Medication Sig Start Date End Date Taking? Authorizing Provider  amLODipine (NORVASC) 10 MG tablet Take 10 mg by mouth daily.    [provider]  cetirizine (ZYRTEC) 5 MG tablet Take 1 tablet (5 mg total) by mouth daily. 10/14/19 11/13/19  Menshew, Dannielle Karvonen, PA-C  cyclobenzaprine (FLEXERIL) 5 MG tablet Take 5 mg by mouth 2 (two) times daily as needed for muscle spasms.     [provider]  docusate sodium (COLACE) 100 MG capsule Take 100 mg by mouth 2 (two) times daily.    [provider]  famotidine (PEPCID) 20 MG tablet Take 1 tablet (20 mg total) by mouth 2 (two) times daily. 07/01/19   Carrie Mew, MD  HYDROcodone-acetaminophen (NORCO/VICODIN) 5-325 MG tablet One tablet every four hours as needed for three days then go back to you usual one tablet every six hours Patient taking differently: Take 1 tablet by mouth every 4 (four) hours as needed for moderate pain.  06/18/19   Wieting, Richard, MD  insulin aspart (NOVOLOG) 100 UNIT/ML FlexPen Inject 2 Units into the skin 3 (three) times daily with meals. 06/18/19   Loletha Grayer, MD  insulin glargine (LANTUS) 100 unit/mL SOPN Inject 0.2 mLs (20 Units total) into the skin at bedtime. 06/18/19   Wieting, Richard, MD  Insulin Pen Needle 29G X 12MM MISC 1 Dose by Does not apply route 3 (three) times daily before meals. 06/18/19   Loletha Grayer, MD  lipase/protease/amylase (CREON) 36000 UNITS CPEP capsule Take 2 capsules (72,000 Units total) by mouth 3 (three) times daily with meals. 01/29/19   Loletha Grayer, MD  omeprazole (PRILOSEC) 40 MG capsule Take 40 mg by mouth in the morning and at bedtime.    [provider]  ondansetron (ZOFRAN ODT) 4 MG disintegrating tablet Take 1 tablet (4 mg total) by mouth every 8 (eight) hours as needed for nausea or vomiting. 07/01/19   Carrie Mew, MD  Polyethylene Glycol 3350 POWD Take 17 g by mouth at bedtime. Mix 17 grams in 4-8 oz of liquid and drink     [provider]  pregabalin (LYRICA) 75 MG capsule Take 1 capsule (75 mg total) by mouth 2 (two) times daily. 08/05/19   Lorella Nimrod, MD  QUEtiapine (SEROQUEL) 25 MG tablet Take 25 mg by mouth as needed (DEPRESSION).     [provider]  rosuvastatin (CRESTOR) 20 MG tablet Take 1 tablet (20 mg total) by mouth daily. 08/05/19   Lorella Nimrod, MD  simethicone (MYLICON) 80 MG chewable tablet Chew 1 tablet (80 mg total) by mouth 4 (four) times daily. Patient not taking: Reported on 08/02/2019 06/18/19   Loletha Grayer, MD  triamcinolone cream (KENALOG) 0.5 % Apply twice a day to back of thighs  06/18/19   Loletha Grayer, MD  zolpidem (AMBIEN) 10 MG tablet Take 1 tablet by mouth at bedtime. 05/10/19   [provider]  GLUCERNA (GLUCERNA) LIQD Take 237 mLs by mouth 3 (three) times daily between meals. 02/19/15 02/27/15  Bettey Costa, MD    Allergies Penicillins, Reglan [metoclopramide], Fentanyl, and Percocet [oxycodone-acetaminophen]  Family History  Family history unknown: Yes    Social History Social History   Tobacco Use  . Smoking status: Former Research scientist (life sciences)  . Smokeless tobacco: Never Used  . Tobacco comment: quit 7 years ago   Vaping Use  . Vaping Use: Some days  Substance Use Topics  . Alcohol use: Not Currently    Comment: quit drinking 2 years ago   . Drug use: No    Review of Systems Constitutional: No fever/chills Eyes: + Recent surgery,-visual changes ENT: + Tongue swelling, -throat swelling, no sore throat. Cardiovascular: Denies chest  pain. Respiratory: + Intermittent shortness of breath. Gastrointestinal: No abdominal pain.  No nausea, no vomiting.  No diarrhea.  No constipation. Genitourinary: Negative for dysuria. Musculoskeletal: Negative for back pain. Skin: Negative for rash. Neurological: + headaches, - focal weakness or numbness.  ____________________________________________   PHYSICAL EXAM:  VITAL SIGNS: ED Triage Vitals  Enc Vitals Group     BP 10/14/19 1305 (!) 169/70     Pulse Rate 10/14/19 1305 88     Resp 10/14/19 1305 16     Temp 10/14/19 1305 98.5 F (36.9 C)     Temp Source 10/14/19 1305 Oral     SpO2 10/14/19 1305 99 %     Weight 10/14/19 1301 120 lb (54.4 kg)     Height 10/14/19 1301 5\' 1"  (1.549 m)     Head Circumference --      Peak Flow --      Pain Score 10/14/19 1300 8     Pain Loc --      Pain Edu? --      Excl. in [place]? --     Constitutional: Alert and oriented. Well appearing and in no acute distress. Eyes: Conjunctivae are normal. PERRL. EOMI, but nystagmus present with right lateral gaze in both eyes. Fundoscopic exam appears WNL. Head: Atraumatic. Nose: No congestion/rhinnorhea. Mouth/Throat: No obvious tongue swelling. Tongue is noted to be sitting towards the right side of the mouth at baseline. mucous membranes are moist.  Oropharynx non-erythematous. Neck: No stridor.   Lymphatic: [place] cervical lymphadenopathy bilaterally. Cardiovascular: Normal rate, regular rhythm. Grossly normal heart sounds.  Good peripheral circulation. Respiratory: Normal respiratory effort.  No retractions. Lungs CTAB. Gastrointestinal: Soft with epigastric tenderness noted. No CVA tenderness.  Musculoskeletal: No lower extremity tenderness nor edema.  No joint effusions. Neurologic:  Normal speech and language. Examination of the cranial nerves II-XII grossly intact, however patient has right sided lag with EOM, tongue movements. No pronator drift. 5/5 strength of major muscle groups of the  upper and lower extremities. Skin:  Skin is warm, dry and intact. No rash noted. Psychiatric: Mood and affect are normal. Speech and behavior are normal.  ____________________________________________   LABS (all labs ordered are listed, but only abnormal results are displayed)  Labs Reviewed  COMPREHENSIVE METABOLIC PANEL - Abnormal; Notable for the following components:      Result Value   Sodium 134 (*)    Glucose, Bld 246 (*)    BUN 35 (*)    Creatinine, Ser 1.63 (*)    Total Protein 8.4 (*)    Alkaline Phosphatase 214 (*)  GFR, Estimated 33 (*)    All other components within normal limits  RESPIRATORY PANEL BY RT PCR (FLU A&B, COVID)  CBC WITH DIFFERENTIAL/PLATELET  LACTIC ACID, PLASMA   ____________________________________________  RADIOLOGY Official radiology report(s): CT ANGIO HEAD W OR WO CONTRAST  Result Date: 10/14/2019 CLINICAL DATA:  Dizziness, nonspecific. Abnormal speech. Oral swelling over last few days. EXAM: CT ANGIOGRAPHY HEAD AND NECK TECHNIQUE: Multidetector CT imaging of the head and neck was performed using the standard protocol during bolus administration of intravenous contrast. Multiplanar CT image reconstructions and MIPs were obtained to evaluate the vascular anatomy. Carotid stenosis measurements (when applicable) are obtained utilizing NASCET criteria, using the distal internal carotid diameter as the denominator. CONTRAST:  57mL OMNIPAQUE IOHEXOL 350 MG/ML SOLN COMPARISON:  CT head without contrast 07/01/2019. MR head without contrast 04/08/2019. FINDINGS: CT HEAD FINDINGS Brain: No acute infarct, hemorrhage, or mass lesion is present. No significant white matter lesions are present. Basal ganglia and insular ribbon are within normal limits. No acute or focal cortical abnormalities are present. The brainstem and cerebellum are within normal limits. Vascular: Atherosclerotic calcifications are again noted within the cavernous internal carotid arteries  bilaterally. No hyperdense vessel is present. Skull: Calvarium is intact. No focal lytic or blastic lesions are present. No significant extracranial soft tissue lesion is present. Sinuses: The paranasal sinuses and mastoid air cells are clear. Orbits: The globes and orbits are within normal limits. Review of the MIP images confirms the above findings CTA NECK FINDINGS Aortic arch: A 3 vessel arch configuration is present. Vascular calcifications are present at the origin of the left subclavian artery without significant stenosis. Additional calcifications are present in the distal arch. No aneurysm is present. Right carotid system: The right common carotid artery is within normal limits. Atherosclerotic changes are present at the bifurcation. The cervical right ICA is otherwise normal. No focal stenosis is present. Left carotid system: The left common carotid artery is within normal limits. Atherosclerotic changes are present at the left carotid bifurcation without significant stenosis. The cervical left ICA is normal. Vertebral arteries: The left vertebral artery is the dominant vessel. Both vertebral arteries originate from the subclavian arteries without significant stenosis. No significant stenosis is present in either vertebral artery in the neck. Skeleton: Vertebral body heights are normal. Slight degenerative anterolisthesis is present at C4-5. Endplate changes and uncovertebral spurring is greatest at C5-6 and C6-7. Straightening and some reversal of the normal lumbar lordosis is noted. No focal lytic or blastic lesions are present. Other neck: The soft tissues the neck are otherwise unremarkable. Right thyroidectomy is noted. 12 mm hyperdense nodule is present in the left lobe of the thyroid. The nodule now is hyperdense. This may reflect enhancement. No significant adenopathy is present. Salivary glands are within normal limits. Upper chest: Patchy ground-glass attenuation is present in the upper lungs  bilaterally. No significant consolidation is present. Thoracic inlet is within normal limits. Review of the MIP images confirms the above findings CTA HEAD FINDINGS Anterior circulation: Atherosclerotic changes are present within the cavernous internal carotid arteries bilaterally. Lumen is narrowed to 1 mm in the supraclinoid right ICA. ICA termini are within normal limits bilaterally. The A1 and M1 segments are normal. The anterior communicating artery is patent. MCA bifurcations are intact. ACA and MCA branch vessels are within normal limits. No significant proximal stenosis or occlusion is present. Posterior circulation: The left vertebral artery is slightly dominant. PICA origins are visualized and normal. Vertebrobasilar junction is normal. Both posterior scratched  at the basilar artery is normal. Both posterior cerebral arteries originate from the basilar tip. The PCA branch vessels are within normal limits. Venous sinuses: Dural sinuses are patent. The straight sinus and deep cerebral veins are intact. Cortical veins are unremarkable. Anatomic variants: None Review of the MIP images confirms the above findings IMPRESSION: 1. No emergent large vessel occlusion. 2. Atherosclerotic changes at the carotid bifurcations bilaterally without significant stenosis. 3. Atherosclerotic changes within the cavernous internal carotid arteries bilaterally. Lumen is narrowed to 1 mm on the right. No significant left-sided stenosis is present. 4. No significant proximal stenosis, aneurysm, or branch vessel occlusion within the Circle of Willis. 5. Degenerative changes of the cervical spine. 6. Patchy ground-glass attenuation in the upper lungs bilaterally. This may represent atelectasis or edema. Infection is not excluded. 7. 12 mm hyperdense nodule in the left lobe of the thyroid. The nodule is now hyperdense. This may reflect enhancement. Recommend thyroid US (ref: J Am Coll Radiol. 2015 Feb;12(2): 143-50). Electronically  Signed   By: San Morelle M.D.   On: 10/14/2019 16:52   CT Angio Neck W and/or Wo Contrast  Result Date: 10/14/2019 CLINICAL DATA:  Dizziness, nonspecific. Abnormal speech. Oral swelling over last few days. EXAM: CT ANGIOGRAPHY HEAD AND NECK TECHNIQUE: Multidetector CT imaging of the head and neck was performed using the standard protocol during bolus administration of intravenous contrast. Multiplanar CT image reconstructions and MIPs were obtained to evaluate the vascular anatomy. Carotid stenosis measurements (when applicable) are obtained utilizing NASCET criteria, using the distal internal carotid diameter as the denominator. CONTRAST:  7mL OMNIPAQUE IOHEXOL 350 MG/ML SOLN COMPARISON:  CT head without contrast 07/01/2019. MR head without contrast 04/08/2019. FINDINGS: CT HEAD FINDINGS Brain: No acute infarct, hemorrhage, or mass lesion is present. No significant white matter lesions are present. Basal ganglia and insular ribbon are within normal limits. No acute or focal cortical abnormalities are present. The brainstem and cerebellum are within normal limits. Vascular: Atherosclerotic calcifications are again noted within the cavernous internal carotid arteries bilaterally. No hyperdense vessel is present. Skull: Calvarium is intact. No focal lytic or blastic lesions are present. No significant extracranial soft tissue lesion is present. Sinuses: The paranasal sinuses and mastoid air cells are clear. Orbits: The globes and orbits are within normal limits. Review of the MIP images confirms the above findings CTA NECK FINDINGS Aortic arch: A 3 vessel arch configuration is present. Vascular calcifications are present at the origin of the left subclavian artery without significant stenosis. Additional calcifications are present in the distal arch. No aneurysm is present. Right carotid system: The right common carotid artery is within normal limits. Atherosclerotic changes are present at the  bifurcation. The cervical right ICA is otherwise normal. No focal stenosis is present. Left carotid system: The left common carotid artery is within normal limits. Atherosclerotic changes are present at the left carotid bifurcation without significant stenosis. The cervical left ICA is normal. Vertebral arteries: The left vertebral artery is the dominant vessel. Both vertebral arteries originate from the subclavian arteries without significant stenosis. No significant stenosis is present in either vertebral artery in the neck. Skeleton: Vertebral body heights are normal. Slight degenerative anterolisthesis is present at C4-5. Endplate changes and uncovertebral spurring is greatest at C5-6 and C6-7. Straightening and some reversal of the normal lumbar lordosis is noted. No focal lytic or blastic lesions are present. Other neck: The soft tissues the neck are otherwise unremarkable. Right thyroidectomy is noted. 12 mm hyperdense nodule is present in  the left lobe of the thyroid. The nodule now is hyperdense. This may reflect enhancement. No significant adenopathy is present. Salivary glands are within normal limits. Upper chest: Patchy ground-glass attenuation is present in the upper lungs bilaterally. No significant consolidation is present. Thoracic inlet is within normal limits. Review of the MIP images confirms the above findings CTA HEAD FINDINGS Anterior circulation: Atherosclerotic changes are present within the cavernous internal carotid arteries bilaterally. Lumen is narrowed to 1 mm in the supraclinoid right ICA. ICA termini are within normal limits bilaterally. The A1 and M1 segments are normal. The anterior communicating artery is patent. MCA bifurcations are intact. ACA and MCA branch vessels are within normal limits. No significant proximal stenosis or occlusion is present. Posterior circulation: The left vertebral artery is slightly dominant. PICA origins are visualized and normal. Vertebrobasilar  junction is normal. Both posterior scratched at the basilar artery is normal. Both posterior cerebral arteries originate from the basilar tip. The PCA branch vessels are within normal limits. Venous sinuses: Dural sinuses are patent. The straight sinus and deep cerebral veins are intact. Cortical veins are unremarkable. Anatomic variants: None Review of the MIP images confirms the above findings IMPRESSION: 1. No emergent large vessel occlusion. 2. Atherosclerotic changes at the carotid bifurcations bilaterally without significant stenosis. 3. Atherosclerotic changes within the cavernous internal carotid arteries bilaterally. Lumen is narrowed to 1 mm on the right. No significant left-sided stenosis is present. 4. No significant proximal stenosis, aneurysm, or branch vessel occlusion within the Circle of Willis. 5. Degenerative changes of the cervical spine. 6. Patchy ground-glass attenuation in the upper lungs bilaterally. This may represent atelectasis or edema. Infection is not excluded. 7. 12 mm hyperdense nodule in the left lobe of the thyroid. The nodule is now hyperdense. This may reflect enhancement. Recommend thyroid US (ref: J Am Coll Radiol. 2015 Feb;12(2): 143-50). Electronically Signed   By: San Morelle M.D.   On: 10/14/2019 16:52   DG Chest Portable 1 View  Result Date: 10/14/2019 CLINICAL DATA:  Shortness of breath: Oral swelling for several days EXAM: PORTABLE CHEST 1 VIEW COMPARISON:  Radiograph 07/01/2019, CT 01/24/2019 FINDINGS: Gradient of increased attenuation towards the lung bases likely related to breast tissue. No consolidation, features of edema, pneumothorax, or effusion. The cardiomediastinal contours are unremarkable. Geometric radiodensities are present in the right axillary region, possibly debris or foreign body external to the patient. No other acute osseous or soft tissue abnormality. IMPRESSION: 1.  Accounting for body habitus, the lungs are clear. 2. Geometric  radiodensities in the right axillary region, possibly debris or foreign body external to the patient. Correlate with visual inspection. Electronically Signed   By: Lovena Le M.D.   On: 10/14/2019 17:43    ____________________________________________   INITIAL IMPRESSION / ASSESSMENT AND PLAN / ED COURSE  As part of my medical decision making, I reviewed the following data within the [facility] notes reviewed and incorporated, Old chart reviewed and Notes from prior ED visits        Patient is a 64 yo female who reports to the emergency department for evaluation of subjective tongue swelling and headache. She was recently started on a new eyedrop medication following cataract surgery. Patient has an atypical neurologic exam with nystagmus and difficulty with right gaze of her eyes though she is able to finally fully get there. She also has her tongue sitting to the right side at baseline and it sits towards the right when speaking but then  when asked she is able to move the tongue in all directions. She has a normal smile and frown, puffing of cheeks. Given this atypical presentation as well as her history for TIA will order a CT angio with contrast of her head neck. This was not performed in code stroke timelines secondary to her being outside of the TPA window regardless of the imaging.  CT revealed no obvious emergent large vessel occlusion.  There was atherosclerotic change of the right carotid artery down to 1 mm.  There was also an incidental finding of a 12 mm thyroid nodule.  These will need outpatient follow-up.  Given that there was no vessel occlusion, begin treatment with the patient for suspected allergic drug reaction with Decadron, small dose of Benadryl as well as Pepcid.  Also given bilateral groundglass opacities on CT scan, a Covid test was run.  The Covid test was negative.  The patient states significant symptomatology improvement after this combination  of medications.  Her speech is easier to understand and when I asked her to move her tongue, she does this more freely.  She also no longer has nystagmus noted to the right.  While her final etiology of her symptoms is unclear, it is reassuring that she had symptom improvement with this combination of medications.  She is resistant to taking a short course of steroids on outpatient basis given previous GI upset with this.  She also has diabetes and we would not want her sugar getting out of control.  Will recommend that she take Zyrtec on outpatient basis for the next 30 days and discontinued the new eyedrops that she was placed on.  I did advise her that she needed to call ophthalmology on Monday to find out if they needed her to be on anything different.  She also was instructed that she needed to have close follow-up with her primary care regarding the atherosclerosis of her carotid artery as well as her thyroid nodule.  The patient expressed understanding.  She was advised that she should return to the emergency department should she experience any worsening of symptoms.      ____________________________________________   FINAL CLINICAL IMPRESSION(S) / ED DIAGNOSES  Final diagnoses:  Adverse effect of drug, initial encounter  Thyroid nodule     ED Discharge Orders         Ordered    cetirizine (ZYRTEC) 5 MG tablet  Daily        10/14/19 1824          *Please note:  Karen [name] was evaluated in Emergency Department on 10/14/2019 for the symptoms described in the history of present illness. She was evaluated in the context of the global COVID-19 pandemic, which necessitated consideration that the patient might be at risk for infection with the SARS-CoV-2 virus that causes COVID-19. Institutional protocols and algorithms that pertain to the evaluation of patients at risk for COVID-19 are in a state of rapid change based on information released by regulatory bodies including the CDC and  federal and state organizations. These policies and algorithms were followed during the patient's care in the ED.  Some ED evaluations and interventions may be delayed as a result of limited staffing during and the pandemic.*   Note:  This document was prepared using Dragon voice recognition software and may include unintentional dictation errors.    Marlana Salvage, PA 10/14/19 Sandrea Hughs    Delman Kitten, MD 10/15/19 [phone]

## 2019-10-14 NOTE — ED Triage Notes (Signed)
Pt to ED via POV c/o oral swelling for the past few days. Pt states that she had eye surgery and has been using and eye drop, her doctor thinks it may be a reaction to some of the medication she is on. Pt is able to speak without difficulty and is not having any trouble breathing. Pt is in NAD.

## 2019-11-15 ENCOUNTER — Emergency Department: Payer: Medicaid Other

## 2019-11-15 ENCOUNTER — Observation Stay
Admission: EM | Admit: 2019-11-15 | Discharge: 2019-11-17 | Disposition: A | Discharge status: Home / Self Care | Payer: Medicaid Other | Attending: Internal Medicine | Admitting: Internal Medicine

## 2019-11-15 ENCOUNTER — Observation Stay: Payer: Medicaid Other

## 2019-11-15 ENCOUNTER — Other Ambulatory Visit: Payer: Self-pay

## 2019-11-15 ENCOUNTER — Encounter: Payer: Self-pay | Admitting: Emergency Medicine

## 2019-11-15 DIAGNOSIS — R4701 Aphasia: Secondary | ICD-10-CM | POA: Insufficient documentation

## 2019-11-15 DIAGNOSIS — R945 Abnormal results of liver function studies: Secondary | ICD-10-CM | POA: Diagnosis not present

## 2019-11-15 DIAGNOSIS — Z87891 Personal history of nicotine dependence: Secondary | ICD-10-CM | POA: Insufficient documentation

## 2019-11-15 DIAGNOSIS — E1142 Type 2 diabetes mellitus with diabetic polyneuropathy: Secondary | ICD-10-CM

## 2019-11-15 DIAGNOSIS — E1165 Type 2 diabetes mellitus with hyperglycemia: Secondary | ICD-10-CM | POA: Insufficient documentation

## 2019-11-15 DIAGNOSIS — K5900 Constipation, unspecified: Secondary | ICD-10-CM | POA: Diagnosis present

## 2019-11-15 DIAGNOSIS — T402X5A Adverse effect of other opioids, initial encounter: Secondary | ICD-10-CM | POA: Diagnosis present

## 2019-11-15 DIAGNOSIS — E1122 Type 2 diabetes mellitus with diabetic chronic kidney disease: Secondary | ICD-10-CM | POA: Diagnosis not present

## 2019-11-15 DIAGNOSIS — Z79899 Other long term (current) drug therapy: Secondary | ICD-10-CM | POA: Insufficient documentation

## 2019-11-15 DIAGNOSIS — E1169 Type 2 diabetes mellitus with other specified complication: Secondary | ICD-10-CM

## 2019-11-15 DIAGNOSIS — E1129 Type 2 diabetes mellitus with other diabetic kidney complication: Secondary | ICD-10-CM | POA: Diagnosis present

## 2019-11-15 DIAGNOSIS — K859 Acute pancreatitis without necrosis or infection, unspecified: Secondary | ICD-10-CM | POA: Diagnosis present

## 2019-11-15 DIAGNOSIS — Z20822 Contact with and (suspected) exposure to covid-19: Secondary | ICD-10-CM | POA: Diagnosis not present

## 2019-11-15 DIAGNOSIS — K861 Other chronic pancreatitis: Secondary | ICD-10-CM | POA: Diagnosis present

## 2019-11-15 DIAGNOSIS — Z794 Long term (current) use of insulin: Secondary | ICD-10-CM | POA: Diagnosis not present

## 2019-11-15 DIAGNOSIS — N1831 Chronic kidney disease, stage 3a: Secondary | ICD-10-CM | POA: Diagnosis not present

## 2019-11-15 DIAGNOSIS — R2990 Unspecified symptoms and signs involving the nervous system: Secondary | ICD-10-CM | POA: Diagnosis present

## 2019-11-15 DIAGNOSIS — R748 Abnormal levels of other serum enzymes: Secondary | ICD-10-CM

## 2019-11-15 DIAGNOSIS — N1832 Chronic kidney disease, stage 3b: Secondary | ICD-10-CM | POA: Diagnosis present

## 2019-11-15 DIAGNOSIS — K860 Alcohol-induced chronic pancreatitis: Secondary | ICD-10-CM | POA: Diagnosis not present

## 2019-11-15 DIAGNOSIS — I129 Hypertensive chronic kidney disease with stage 1 through stage 4 chronic kidney disease, or unspecified chronic kidney disease: Secondary | ICD-10-CM | POA: Diagnosis not present

## 2019-11-15 DIAGNOSIS — R7989 Other specified abnormal findings of blood chemistry: Secondary | ICD-10-CM | POA: Diagnosis present

## 2019-11-15 DIAGNOSIS — R140 Abdominal distension (gaseous): Secondary | ICD-10-CM

## 2019-11-15 DIAGNOSIS — I639 Cerebral infarction, unspecified: Secondary | ICD-10-CM

## 2019-11-15 DIAGNOSIS — E785 Hyperlipidemia, unspecified: Secondary | ICD-10-CM | POA: Diagnosis present

## 2019-11-15 DIAGNOSIS — Z23 Encounter for immunization: Secondary | ICD-10-CM | POA: Insufficient documentation

## 2019-11-15 DIAGNOSIS — R29810 Facial weakness: Principal | ICD-10-CM | POA: Insufficient documentation

## 2019-11-15 DIAGNOSIS — N183 Chronic kidney disease, stage 3 unspecified: Secondary | ICD-10-CM | POA: Diagnosis present

## 2019-11-15 DIAGNOSIS — I10 Essential (primary) hypertension: Secondary | ICD-10-CM | POA: Diagnosis present

## 2019-11-15 DIAGNOSIS — J45909 Unspecified asthma, uncomplicated: Secondary | ICD-10-CM | POA: Diagnosis not present

## 2019-11-15 DIAGNOSIS — K219 Gastro-esophageal reflux disease without esophagitis: Secondary | ICD-10-CM | POA: Diagnosis present

## 2019-11-15 LAB — DIFFERENTIAL
Abs Immature Granulocytes: 0.04 10*3/uL (ref 0.00–0.07)
Basophils Absolute: 0 10*3/uL (ref 0.0–0.1)
Basophils Relative: 1 %
Eosinophils Absolute: 0.2 10*3/uL (ref 0.0–0.5)
Eosinophils Relative: 2 %
Immature Granulocytes: 1 %
Lymphocytes Relative: 29 %
Lymphs Abs: 2.5 10*3/uL (ref 0.7–4.0)
Monocytes Absolute: 0.9 10*3/uL (ref 0.1–1.0)
Monocytes Relative: 10 %
Neutro Abs: 5.1 10*3/uL (ref 1.7–7.7)
Neutrophils Relative %: 57 %

## 2019-11-15 LAB — COMPREHENSIVE METABOLIC PANEL
ALT: 72 U/L — ABNORMAL HIGH (ref 0–44)
AST: 48 U/L — ABNORMAL HIGH (ref 15–41)
Albumin: 3.9 g/dL (ref 3.5–5.0)
Alkaline Phosphatase: 284 U/L — ABNORMAL HIGH (ref 38–126)
Anion gap: 10 (ref 5–15)
BUN: 42 mg/dL — ABNORMAL HIGH (ref 8–23)
CO2: 26 mmol/L (ref 22–32)
Calcium: 9 mg/dL (ref 8.9–10.3)
Chloride: 98 mmol/L (ref 98–111)
Creatinine, Ser: 1.65 mg/dL — ABNORMAL HIGH (ref 0.44–1.00)
GFR, Estimated: 35 mL/min — ABNORMAL LOW (ref >60)
Glucose, Bld: 405 mg/dL — ABNORMAL HIGH (ref 70–99)
Potassium: 4.4 mmol/L (ref 3.5–5.1)
Sodium: 134 mmol/L — ABNORMAL LOW (ref 135–145)
Total Bilirubin: 0.8 mg/dL (ref 0.3–1.2)
Total Protein: 7.9 g/dL (ref 6.5–8.1)

## 2019-11-15 LAB — LIPASE, BLOOD: Lipase: 71 U/L — ABNORMAL HIGH (ref 11–51)

## 2019-11-15 LAB — CK: Total CK: 571 U/L — ABNORMAL HIGH (ref 38–234)

## 2019-11-15 LAB — ETHANOL: Alcohol, Ethyl (B): <10 mg/dL (ref <10)

## 2019-11-15 LAB — CBC
HCT: 34.6 % — ABNORMAL LOW (ref 36.0–46.0)
Hemoglobin: 11.7 g/dL — ABNORMAL LOW (ref 12.0–15.0)
MCH: 28.9 pg (ref 26.0–34.0)
MCHC: 33.8 g/dL (ref 30.0–36.0)
MCV: 85.4 fL (ref 80.0–100.0)
Platelets: 250 10*3/uL (ref 150–400)
RBC: 4.05 MIL/uL (ref 3.87–5.11)
RDW: 12.3 % (ref 11.5–15.5)
WBC: 8.8 10*3/uL (ref 4.0–10.5)
nRBC: 0 % (ref 0.0–0.2)

## 2019-11-15 LAB — PROTIME-INR
INR: 0.9 (ref 0.8–1.2)
Prothrombin Time: 11.4 seconds (ref 11.4–15.2)

## 2019-11-15 LAB — RESPIRATORY PANEL BY RT PCR (FLU A&B, COVID)
Influenza A by PCR: NEGATIVE
Influenza B by PCR: NEGATIVE
SARS Coronavirus 2 by RT PCR: NEGATIVE

## 2019-11-15 LAB — APTT: aPTT: 34 seconds (ref 24–36)

## 2019-11-15 LAB — CBG MONITORING, ED: Glucose-Capillary: 405 mg/dL — ABNORMAL HIGH (ref 70–99)

## 2019-11-15 MED ORDER — DIPHENHYDRAMINE HCL 50 MG/ML IJ SOLN
25.0000 mg | Freq: Once | INTRAMUSCULAR | Status: AC
  Administered 2019-11-15: 25 mg via INTRAVENOUS
  Filled 2019-11-15: qty 1

## 2019-11-15 MED ORDER — INSULIN GLARGINE 100 UNIT/ML ~~LOC~~ SOLN
20.0000 [IU] | Freq: Every day | SUBCUTANEOUS | Status: DC
  Administered 2019-11-15 – 2019-11-16 (×2): 20 [IU] via SUBCUTANEOUS
  Filled 2019-11-15 (×3): qty 0.2

## 2019-11-15 MED ORDER — ROSUVASTATIN CALCIUM 20 MG PO TABS
20.0000 mg | ORAL_TABLET | Freq: Every day | ORAL | Status: DC
  Administered 2019-11-16: 20 mg via ORAL
  Filled 2019-11-15: qty 1

## 2019-11-15 MED ORDER — PANTOPRAZOLE SODIUM 40 MG PO TBEC
40.0000 mg | DELAYED_RELEASE_TABLET | Freq: Every day | ORAL | Status: DC
  Administered 2019-11-15 – 2019-11-17 (×3): 40 mg via ORAL
  Filled 2019-11-15 (×4): qty 1

## 2019-11-15 MED ORDER — STROKE: EARLY STAGES OF RECOVERY BOOK: Freq: Once | Status: DC

## 2019-11-15 MED ORDER — ASPIRIN 81 MG PO CHEW
324.0000 mg | CHEWABLE_TABLET | Freq: Once | ORAL | Status: AC
  Administered 2019-11-15: 324 mg via ORAL
  Filled 2019-11-15: qty 4

## 2019-11-15 MED ORDER — ACETAMINOPHEN 500 MG PO TABS
1000.0000 mg | ORAL_TABLET | Freq: Once | ORAL | Status: AC
  Administered 2019-11-15: 1000 mg via ORAL
  Filled 2019-11-15: qty 2

## 2019-11-15 MED ORDER — KETOROLAC TROMETHAMINE 30 MG/ML IJ SOLN
15.0000 mg | INTRAMUSCULAR | Status: AC
  Administered 2019-11-15: 15 mg via INTRAVENOUS
  Filled 2019-11-15: qty 1

## 2019-11-15 MED ORDER — FAMOTIDINE 20 MG PO TABS
20.0000 mg | ORAL_TABLET | Freq: Two times a day (BID) | ORAL | Status: DC
  Administered 2019-11-15 – 2019-11-16 (×2): 20 mg via ORAL
  Filled 2019-11-15 (×2): qty 1

## 2019-11-15 MED ORDER — POLYETHYLENE GLYCOL 3350 17 G PO PACK
17.0000 g | PACK | Freq: Two times a day (BID) | ORAL | Status: DC
  Administered 2019-11-16 – 2019-11-17 (×3): 17 g via ORAL
  Filled 2019-11-15 (×3): qty 1

## 2019-11-15 MED ORDER — SODIUM CHLORIDE 0.9% FLUSH
3.0000 mL | Freq: Once | INTRAVENOUS | Status: AC
  Administered 2019-11-15: 3 mL via INTRAVENOUS

## 2019-11-15 MED ORDER — PANCRELIPASE (LIP-PROT-AMYL) 12000-38000 UNITS PO CPEP
72000.0000 [IU] | ORAL_CAPSULE | Freq: Three times a day (TID) | ORAL | Status: DC
  Administered 2019-11-16 – 2019-11-17 (×5): 72000 [IU] via ORAL
  Filled 2019-11-15 (×7): qty 6

## 2019-11-15 MED ORDER — PREGABALIN 75 MG PO CAPS
75.0000 mg | ORAL_CAPSULE | Freq: Two times a day (BID) | ORAL | Status: DC
  Administered 2019-11-15 – 2019-11-17 (×4): 75 mg via ORAL
  Filled 2019-11-15 (×4): qty 1

## 2019-11-15 MED ORDER — SODIUM CHLORIDE 0.9 % IV SOLN: INTRAVENOUS | Status: DC

## 2019-11-15 MED ORDER — QUETIAPINE FUMARATE 25 MG PO TABS: 25.0000 mg | ORAL_TABLET | ORAL | Status: DC | PRN

## 2019-11-15 MED ORDER — INSULIN ASPART 100 UNIT/ML ~~LOC~~ SOLN
0.0000 [IU] | SUBCUTANEOUS | Status: DC
  Administered 2019-11-15: 9 [IU] via SUBCUTANEOUS
  Administered 2019-11-16: 20:00:00 1 [IU] via SUBCUTANEOUS
  Administered 2019-11-16: 16:00:00 5 [IU] via SUBCUTANEOUS
  Administered 2019-11-16: 7 [IU] via SUBCUTANEOUS
  Administered 2019-11-16: 2 [IU] via SUBCUTANEOUS
  Administered 2019-11-16 – 2019-11-17 (×2): 1 [IU] via SUBCUTANEOUS
  Administered 2019-11-17 (×2): 2 [IU] via SUBCUTANEOUS
  Filled 2019-11-15 (×8): qty 1

## 2019-11-15 MED ORDER — ASPIRIN 325 MG PO TABS
325.0000 mg | ORAL_TABLET | Freq: Every day | ORAL | Status: DC
  Administered 2019-11-16 – 2019-11-17 (×2): 325 mg via ORAL
  Filled 2019-11-15 (×4): qty 1

## 2019-11-15 MED ORDER — SENNOSIDES-DOCUSATE SODIUM 8.6-50 MG PO TABS: 1.0000 | ORAL_TABLET | Freq: Every evening | ORAL | Status: DC | PRN

## 2019-11-15 MED ORDER — SODIUM CHLORIDE 0.9 % IV BOLUS
1000.0000 mL | Freq: Once | INTRAVENOUS | Status: AC
  Administered 2019-11-15: 1000 mL via INTRAVENOUS

## 2019-11-15 MED ORDER — MORPHINE SULFATE (PF) 2 MG/ML IV SOLN
2.0000 mg | INTRAVENOUS | Status: DC | PRN
  Administered 2019-11-16 – 2019-11-17 (×5): 2 mg via INTRAVENOUS
  Filled 2019-11-15 (×6): qty 1

## 2019-11-15 MED ORDER — ASPIRIN 300 MG RE SUPP: 300.0000 mg | Freq: Every day | RECTAL | Status: DC

## 2019-11-15 NOTE — ED Notes (Signed)
Pt to MRI

## 2019-11-15 NOTE — ED Provider Notes (Signed)
[facility] Emergency Department Provider Note  ____________________________________________  Time seen: Approximately 7:16 PM  I have reviewed the triage vital signs and the nursing notes.   HISTORY  Chief Complaint Facial Droop and Aphasia    HPI Karen [name] is a 64 y.o. female with a history of diabetes, hypertension who is sent to the ED from ophthalmology due to facial droop, slurred speech, right-sided weakness that started at 5:30 PM yesterday.  Symptoms are constant without aggravating or alleviating factors.  Patient describes being off balance when she walks.  No trauma.  Denies vision change.  Complains of left-sided headache.    Past Medical History:  Diagnosis Date  . Alcoholic pancreatitis   . Asthma   . Depression   . Diabetes mellitus without complication ([place])   . DKA (diabetic ketoacidoses) 02/17/2015  . Emphysematous cystitis   . Hypercholesteremia   . Hypertension   . Hypokalemia      Patient Active Problem List   Diagnosis Date Noted  . Itching   . Diabetic polyneuropathy associated with type 2 diabetes mellitus ([place])   . Leukocytosis 06/13/2019  . Chronic kidney disease (CKD), stage II (mild)   . CKD (chronic kidney disease), stage IIIa 03/15/2019  . HTN (hypertension) 03/15/2019  . Type II diabetes mellitus with renal manifestations ([place]) 03/15/2019  . Headache 02/26/2019  . Dizziness 02/26/2019  . Hyperglycemia due to type 2 diabetes mellitus ([place]) 01/30/2019  . Acute pancreatitis 01/30/2019  . Constipation   . Acute on chronic pancreatitis ([place]) 01/25/2019  . Acute pain of left shoulder   . Adhesive capsulitis of left shoulder   . Abnormal LFTs 01/24/2019  . Cough 01/24/2019  . Hypertension   . HLD (hyperlipidemia)   . Type 2 diabetes mellitus with hyperlipidemia ([place])   . GERD (gastroesophageal reflux disease)   . Acute kidney injury superimposed on CKD ([place])   . Pancreatitis, recurrent   . Chronic  pancreatitis ([place]) 09/25/2018  . 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 Coffee ground emesis   . Upper GI bleed 11/11/2017  . Pancreatitis, acute 01/03/2017  . ETOH abuse 01/03/2017  . UTI (urinary tract infection) 01/03/2017  . Nausea and vomiting   . Esophageal candidiasis ([place])   . Intractable vomiting with nausea   . Hypertensive urgency 04/21/2016  . Abdominal pain   . Nausea vomiting and diarrhea   . Smoker   . Poorly controlled type 2 diabetes mellitus ([place])   . Acute renal insufficiency 10/29/2015  . Elevated troponin 10/29/2015  . Urinary tract infection without hematuria 10/29/2015  . Left flank pain 10/29/2015  . Malignant essential hypertension 10/29/2015  . DKA (diabetic ketoacidoses) 02/17/2015  . Emphysematous cystitis      Past Surgical History:  Procedure Laterality Date  . ABDOMINAL HYSTERECTOMY  1996  . APPENDECTOMY  1997  . ESOPHAGOGASTRODUODENOSCOPY N/A 11/12/2017   Procedure: ESOPHAGOGASTRODUODENOSCOPY (EGD);  Surgeon: Lin Landsman, MD;  Location: [facility] ENDOSCOPY;  Service: Gastroenterology;  Laterality: N/A;  . ESOPHAGOGASTRODUODENOSCOPY N/A 09/29/2018   Procedure: ESOPHAGOGASTRODUODENOSCOPY (EGD);  Surgeon: Toledo, Benay Pike, MD;  Location: [facility] ENDOSCOPY;  Service: Gastroenterology;  Laterality: N/A;  . ESOPHAGOGASTRODUODENOSCOPY (EGD) WITH PROPOFOL N/A 04/24/2016   Procedure: ESOPHAGOGASTRODUODENOSCOPY (EGD) WITH PROPOFOL;  Surgeon: [name], MD;  Location: [facility] ENDOSCOPY;  Service: Endoscopy;  Laterality: N/A;  . ESOPHAGOGASTRODUODENOSCOPY (EGD) WITH PROPOFOL N/A 01/28/2019   Procedure: ESOPHAGOGASTRODUODENOSCOPY (EGD) WITH PROPOFOL;  Surgeon: Toledo, Benay Pike, MD;  Location: [facility] ENDOSCOPY;  Service: Gastroenterology;  Laterality:  N/A;  . EYE SURGERY    . HAND SURGERY  1988  . THYROID SURGERY  2013     Prior to Admission medications   Medication Sig Start Date End Date Taking? Authorizing Provider  amLODipine (NORVASC) 10 MG tablet Take  10 mg by mouth daily.    [provider]  cetirizine (ZYRTEC) 5 MG tablet Take 1 tablet (5 mg total) by mouth daily. 10/14/19 11/13/19  Menshew, Dannielle Karvonen, PA-C  cyclobenzaprine (FLEXERIL) 5 MG tablet Take 5 mg by mouth 2 (two) times daily as needed for muscle spasms.     [provider]  docusate sodium (COLACE) 100 MG capsule Take 100 mg by mouth 2 (two) times daily.    [provider]  famotidine (PEPCID) 20 MG tablet Take 1 tablet (20 mg total) by mouth 2 (two) times daily. 07/01/19   Carrie Mew, MD  HYDROcodone-acetaminophen (NORCO/VICODIN) 5-325 MG tablet One tablet every four hours as needed for three days then go back to you usual one tablet every six hours Patient taking differently: Take 1 tablet by mouth every 4 (four) hours as needed for moderate pain.  06/18/19   Wieting, Richard, MD  insulin aspart (NOVOLOG) 100 UNIT/ML FlexPen Inject 2 Units into the skin 3 (three) times daily with meals. 06/18/19   Loletha Grayer, MD  insulin glargine (LANTUS) 100 unit/mL SOPN Inject 0.2 mLs (20 Units total) into the skin at bedtime. 06/18/19   Wieting, Richard, MD  Insulin Pen Needle 29G X 12MM MISC 1 Dose by Does not apply route 3 (three) times daily before meals. 06/18/19   Loletha Grayer, MD  lipase/protease/amylase (CREON) 36000 UNITS CPEP capsule Take 2 capsules (72,000 Units total) by mouth 3 (three) times daily with meals. 01/29/19   Loletha Grayer, MD  omeprazole (PRILOSEC) 40 MG capsule Take 40 mg by mouth in the morning and at bedtime.    [provider]  ondansetron (ZOFRAN ODT) 4 MG disintegrating tablet Take 1 tablet (4 mg total) by mouth every 8 (eight) hours as needed for nausea or vomiting. 07/01/19   Carrie Mew, MD  Polyethylene Glycol 3350 POWD Take 17 g by mouth at bedtime. Mix 17 grams in 4-8 oz of liquid and drink     [provider]  pregabalin (LYRICA) 75 MG capsule Take 1 capsule (75 mg total) by mouth 2 (two) times  daily. 08/05/19   Lorella Nimrod, MD  QUEtiapine (SEROQUEL) 25 MG tablet Take 25 mg by mouth as needed (DEPRESSION).     [provider]  rosuvastatin (CRESTOR) 20 MG tablet Take 1 tablet (20 mg total) by mouth daily. 08/05/19   Lorella Nimrod, MD  simethicone (MYLICON) 80 MG chewable tablet Chew 1 tablet (80 mg total) by mouth 4 (four) times daily. Patient not taking: Reported on 08/02/2019 06/18/19   Loletha Grayer, MD  triamcinolone cream (KENALOG) 0.5 % Apply twice a day to back of thighs 06/18/19   Loletha Grayer, MD  zolpidem (AMBIEN) 10 MG tablet Take 1 tablet by mouth at bedtime. 05/10/19   [provider]  GLUCERNA (GLUCERNA) LIQD Take 237 mLs by mouth 3 (three) times daily between meals. 02/19/15 02/27/15  Bettey Costa, MD     Allergies Penicillins, Reglan [metoclopramide], Fentanyl, and Percocet [oxycodone-acetaminophen]   Family History  Family history unknown: Yes    Social History Social History   Tobacco Use  . Smoking status: Former Research scientist (life sciences)  . Smokeless tobacco: Never Used  . Tobacco comment: quit 7 years  ago   Vaping Use  . Vaping Use: Some days  Substance Use Topics  . Alcohol use: Not Currently    Comment: quit drinking 2 years ago   . Drug use: No    Review of Systems  Constitutional:   No fever or chills.  ENT:   No sore throat. No rhinorrhea. Cardiovascular:   No chest pain or syncope. Respiratory:   No dyspnea or cough. Gastrointestinal:   Negative for abdominal pain, vomiting and diarrhea.  Musculoskeletal:   Negative for focal pain or swelling All other systems reviewed and are negative except as documented above in ROS and HPI.  ____________________________________________   PHYSICAL EXAM:  VITAL SIGNS: ED Triage Vitals  Enc Vitals Group     BP 11/15/19 1557 (!) 142/53     Pulse Rate 11/15/19 1557 82     Resp 11/15/19 1557 (!) 22     Temp 11/15/19 1557 98.7 F (37.1 C)     Temp Source 11/15/19 1557 Oral     SpO2 11/15/19  1557 98 %     Weight 11/15/19 1558 119 lb 14.9 oz (54.4 kg)     Height 11/15/19 1558 5\' 1"  (1.549 m)     Head Circumference --      Peak Flow --      Pain Score 11/15/19 1558 8     Pain Loc --      Pain Edu? --      Excl. in [place]? --     Vital signs reviewed, nursing assessments reviewed.   Constitutional:   Alert and oriented. Non-toxic appearance. Eyes:   Conjunctivae are normal. EOMI. PERRL. ENT      Head:   Normocephalic and atraumatic.      Nose:   Wearing a mask.      Mouth/Throat:   Wearing a mask.      Neck:   No meningismus. Full ROM. Hematological/Lymphatic/Immunilogical:   No cervical lymphadenopathy. Cardiovascular:   RRR. Symmetric bilateral radial and DP pulses.  No murmurs. Cap refill less than 2 seconds. Respiratory:   Normal respiratory effort without tachypnea/retractions. Breath sounds are clear and equal bilaterally. No wheezes/rales/rhonchi. Gastrointestinal:   Soft and nontender. Non distended. There is no CVA tenderness.  No rebound, rigidity, or guarding.  Musculoskeletal:   Normal range of motion in all extremities. No joint effusions.  No lower extremity tenderness.  No edema. Neurologic:   Slurred speech Mild right facial droop Limb ataxia of the right upper extremity Motor strength grossly intact. NIH stroke scale 3. Skin:    Skin is warm, dry and intact. No rash noted.  No petechiae, purpura, or bullae.  ____________________________________________    LABS (pertinent positives/negatives) (all labs ordered are listed, but only abnormal results are displayed) Labs Reviewed  CBC - Abnormal; Notable for the following components:      Result Value   Hemoglobin 11.7 (*)    HCT 34.6 (*)    All other components within normal limits  COMPREHENSIVE METABOLIC PANEL - Abnormal; Notable for the following components:   Sodium 134 (*)    Glucose, Bld 405 (*)    BUN 42 (*)    Creatinine, Ser 1.65 (*)    AST 48 (*)    ALT 72 (*)    Alkaline Phosphatase  284 (*)    GFR, Estimated 35 (*)    All other components within normal limits  PROTIME-INR  APTT  DIFFERENTIAL  I-STAT CREATININE, ED  CBG MONITORING, ED  ____________________________________________   EKG  Interpreted by me  Date: 11/15/2019  Rate: 8  Rhythm: normal sinus rhythm  QRS Axis: normal  Intervals: normal  ST/T Wave abnormalities: normal  Conduction Disutrbances: none  Narrative Interpretation: unremarkable      ____________________________________________    RADIOLOGY  CT HEAD WO CONTRAST  Result Date: 11/15/2019 CLINICAL DATA:  Right-sided facial droop EXAM: CT HEAD WITHOUT CONTRAST TECHNIQUE: Contiguous axial images were obtained from the base of the skull through the vertex without intravenous contrast. COMPARISON:  None. FINDINGS: Brain: No evidence of acute territorial infarction, hemorrhage, hydrocephalus,extra-axial collection or mass lesion/mass effect. Normal gray-white differentiation. Ventricles are normal in size and contour. Vascular: No hyperdense vessel or unexpected calcification. Skull: The skull is intact. No fracture or focal lesion identified. Sinuses/Orbits: The visualized paranasal sinuses and mastoid air cells are clear. The orbits and globes intact. Other: None IMPRESSION: No acute intracranial abnormality. Electronically Signed   By: Prudencio Pair M.D.   On: 11/15/2019 16:49    ____________________________________________   PROCEDURES Procedures  ____________________________________________  DIFFERENTIAL DIAGNOSIS   Ischemic stroke, intracranial hemorrhage, complicated migraine, electrolyte abnormality, intracranial tumor.   CLINICAL IMPRESSION / ASSESSMENT AND PLAN / ED COURSE  Medications ordered in the ED: Medications  sodium chloride flush (NS) 0.9 % injection 3 mL (3 mLs Intravenous Given 11/15/19 1657)  aspirin chewable tablet 324 mg (324 mg Oral Given 11/15/19 1739)  acetaminophen (TYLENOL) tablet 1,000 mg (1,000 mg  Oral Given 11/15/19 1739)    Pertinent labs & imaging results that were available during my care of the patient were reviewed by me and considered in my medical decision making (see chart for details).  Karen [name] was evaluated in Emergency Department on 11/15/2019 for the symptoms described in the history of present illness. She was evaluated in the context of the global COVID-19 pandemic, which necessitated consideration that the patient might be at risk for infection with the SARS-CoV-2 virus that causes COVID-19. Institutional protocols and algorithms that pertain to the evaluation of patients at risk for COVID-19 are in a state of rapid change based on information released by regulatory bodies including the CDC and federal and state organizations. These policies and algorithms were followed during the patient's care in the ED.   Patient presents with right-sided weakness and change in coordination.  Some dysarthria as well, concerning for ischemic stroke.  Initial CT scan viewed by me, no obvious intracranial hemorrhage or infarct.  Radiology report confirms is unremarkable.  Will follow up with an MRI.  Symptoms are persistent throughout her time in the ED, so we will plan to hospitalize for further stroke work-up.  Aspirin and Tylenol given for headache and antiplatelet therapy.  Patient still complains of headache and I will give some additional medications for comfort.  Will give IV fluids for hyperglycemia.      ____________________________________________   FINAL CLINICAL IMPRESSION(S) / ED DIAGNOSES    Final diagnoses:  Acute CVA (cerebrovascular accident) ([place])  Type 2 diabetes mellitus with hyperglycemia, with long-term current use of insulin [facility])     ED Discharge Orders    None      Portions of this note were generated with dragon dictation software. Dictation errors may occur despite best attempts at proofreading.   Carrie Mew, MD 11/15/19 1921

## 2019-11-15 NOTE — H&P (Signed)
Karen [name]:[id number] DOB: 11-02-1955 DOA: 11/15/2019     PCP: Lavera Guise, MD   Outpatient Specialists:    GI Dr. Alice Reichert      Patient arrived to ER on 11/15/19 at 1547 Referred by Attending Carrie Mew, MD   Patient coming from: home Lives   With SO    Chief Complaint:   Chief Complaint  Patient presents with  . Facial Droop  . Aphasia    HPI: Karen [name] is a 64 y.o. female with medical history significant of chronic pancreatitis Pancreas divisum, HTN, DM 2, constipation, HLD, EtOh abuse in remission, asthma, DKA, CKD stage 3, chronic pain, chronic CHF grade 1 diastolic   Presented with   Right facial droop, and pain slurred speech and drooling. Went to the eye doctor regarding her eye pain that was around 2 pm. Reports Right eye hurts to look at bright lights. She reports symptoms started yesterday at 5 PM  Reports abdominal pain similar to her chronic pancreatitis Last BM was today. Patient reports that she accidentally spilled all her pain medications     Infectious risk factors:  Reports abdominal pain,       Has   been vaccinated against [place] in August 2021   Initial COVID TEST  NEGATIVE   Lab Results  Component Value Date   [place] 11/15/2019   [place] NEGATIVE 10/14/2019   [place] NEGATIVE 08/02/2019   [place] NEGATIVE 07/01/2019     Regarding pertinent Chronic problems:     Hyperlipidemia -  on statins Crestor Lipid Panel     Component Value Date/Time   CHOL 222 (H) 02/27/2019 0311   CHOL 395 (H) 09/02/2011 0608   TRIG 355 (H) 02/27/2019 0311   TRIG 1,436 (H) 09/02/2011 0608   HDL 44 02/27/2019 0311   HDL 52 09/02/2011 0608   CHOLHDL 5.0 02/27/2019 0311   VLDL 71 (H) 02/27/2019 0311   VLDL SEE COMMENT 09/02/2011 0608   LDLCALC 107 (H) 02/27/2019 0311   LDLCALC SEE COMMENT 09/02/2011 0608     HTN on NOrvasc    chronic CHF diastolic  - last echo feb 2021 Grade I diastolic  dysfunction (impaired relaxation).       DM 2 -  Lab Results  Component Value Date   HGBA1C 10.7 (H) 06/14/2019    on insulin    Asthma -well  controlled on home inhalers/ nebs f                             CKD stage III - baseline Cr 1.6 Estimated Creatinine Clearance: 26.7 mL/min (A) (by C-G formula based on SCr of 1.65 mg/dL (H)).  Lab Results  Component Value Date   CREATININE 1.65 (H) 11/15/2019   CREATININE 1.63 (H) 10/14/2019   CREATININE 1.31 (H) 08/05/2019     While in ER: Noted to have blood sugar 405 Slightly elevated LFTs CT head unremarkable Given aspirin 324   Abdomen benign   Hospitalist was called for admission for possible CVA  The following Work up has been ordered so far:  Orders Placed This Encounter  Procedures  . CT HEAD WO CONTRAST  . MR BRAIN WO CONTRAST  . Protime-INR  . APTT  . CBC  . Differential  . Comprehensive metabolic panel  . Diet NPO time specified  . Cardiac monitoring  . Stroke swallow screen  . NIH Stroke Scale  .  Modified Stroke Scale (mNIHSS) Document mNIHSS assessment every 2 hours for a total of 12 hours  . Saline Lock IV, Maintain IV access  . If O2 Sat <94% administer O2 at 2 liters/minute via nasal cannula  . Stroke swallow screen  . Consult to hospitalist  . Pulse oximetry, continuous  . I-stat Creatinine, ED  . CBG monitoring, ED  . ED EKG  . EKG 12-Lead   Following Medications were ordered in ER: Medications  sodium chloride flush (NS) 0.9 % injection 3 mL (3 mLs Intravenous Given 11/15/19 1657)  aspirin chewable tablet 324 mg (324 mg Oral Given 11/15/19 1739)  acetaminophen (TYLENOL) tablet 1,000 mg (1,000 mg Oral Given 11/15/19 1739)        Consult Orders  (From admission, onward)         Start     Ordered   11/15/19 1859  Consult to hospitalist  Once       Provider:  (Not yet assigned)  Question Answer Comment  Place call to: ED, callback [phone]   Reason for Consult Admit     Diagnosis/Clinical Info for Consult: CVA      11/15/19 1858           Significant initial  Findings: Abnormal Labs Reviewed  CBC - Abnormal; Notable for the following components:      Result Value   Hemoglobin 11.7 (*)    HCT 34.6 (*)    All other components within normal limits  COMPREHENSIVE METABOLIC PANEL - Abnormal; Notable for the following components:   Sodium 134 (*)    Glucose, Bld 405 (*)    BUN 42 (*)    Creatinine, Ser 1.65 (*)    AST 48 (*)    ALT 72 (*)    Alkaline Phosphatase 284 (*)    GFR, Estimated 35 (*)    All other components within normal limits     Otherwise labs showing:    Recent Labs  Lab 11/15/19 1606  NA 134*  K 4.4  CO2 26  GLUCOSE 405*  BUN 42*  CREATININE 1.65*  CALCIUM 9.0    Cr   stable,    Lab Results  Component Value Date   CREATININE 1.65 (H) 11/15/2019   CREATININE 1.63 (H) 10/14/2019   CREATININE 1.31 (H) 08/05/2019    Recent Labs  Lab 11/15/19 1606  AST 48*  ALT 72*  ALKPHOS 284*  BILITOT 0.8  PROT 7.9  ALBUMIN 3.9   Lab Results  Component Value Date   CALCIUM 9.0 11/15/2019   PHOS 4.5 03/03/2019   WBC      Component Value Date/Time   WBC 8.8 11/15/2019 1606   LYMPHSABS 2.5 11/15/2019 1606   LYMPHSABS 0.4 (L) 09/02/2011 0608   MONOABS 0.9 11/15/2019 1606   MONOABS 1.0 (H) 09/02/2011 0608   EOSABS 0.2 11/15/2019 1606   EOSABS 0.0 09/02/2011 0608   BASOSABS 0.0 11/15/2019 1606   BASOSABS 0.0 09/02/2011 0608   Plt: Lab Results  Component Value Date   PLT 250 11/15/2019    COVID-19 Labs  No results for input(s): DDIMER, FERRITIN, LDH, CRP in the last 72 hours.  Lab Results  Component Value Date   [place]AR[place]OV2NAA NEGATIVE 10/14/2019   [place] NEGATIVE 08/02/2019   [place] NEGATIVE 07/01/2019   [place] NEGATIVE 06/13/2019     HG/HCT  stable,       Component Value Date/Time   HGB 11.7 (L) 11/15/2019 1606   HGB 15.1 08/11/2013 0729   HCT  34.6 (L) 11/15/2019 1606   HCT 45.9  08/11/2013 0729   MCV 85.4 11/15/2019 1606   MCV 95 08/11/2013 0729    Recent Labs  Lab 11/15/19 1936  LIPASE 71*   No results for input(s): AMMONIA in the last 168 hours.      ECG: Ordered Personally reviewed by me showing: HR :78 Rhythm:  NSR    no evidence of ischemic changes QTC 414   BNP (last 3 results) Recent Labs    06/13/19 0729  BNP 33.4    DM  labs:  HbA1C: Recent Labs    01/30/19 0230 02/27/19 0311 06/14/19 0535  HGBA1C 11.0* 11.1* 10.7*     CBG (last 3)  Recent Labs    11/15/19 2014  GLUCAP 405*     UA  ordered   Urine analysis:    Component Value Date/Time   COLORURINE STRAW (A) 08/02/2019 0714   APPEARANCEUR HAZY (A) 08/02/2019 0714   APPEARANCEUR Hazy 08/11/2013 0729   LABSPEC 1.006 08/02/2019 0714   LABSPEC 1.030 08/11/2013 0729   PHURINE 7.0 08/02/2019 0714   GLUCOSEU 50 (A) 08/02/2019 0714   GLUCOSEU >=500 08/11/2013 0729   HGBUR NEGATIVE 08/02/2019 0714   BILIRUBINUR NEGATIVE 08/02/2019 0714   BILIRUBINUR Negative 08/11/2013 0729   KETONESUR NEGATIVE 08/02/2019 0714   PROTEINUR 100 (A) 08/02/2019 0714   NITRITE NEGATIVE 08/02/2019 0714   LEUKOCYTESUR MODERATE (A) 08/02/2019 0714   LEUKOCYTESUR Negative 08/11/2013 0729       Ordered  CT HEAD  NON acute  CXR -  NON acute  KUB showing constipation MRI pending    ED Triage Vitals  Enc Vitals Group     BP 11/15/19 1557 (!) 142/53     Pulse Rate 11/15/19 1557 82     Resp 11/15/19 1557 (!) 22     Temp 11/15/19 1557 98.7 F (37.1 C)     Temp Source 11/15/19 1557 Oral     SpO2 11/15/19 1557 98 %     Weight 11/15/19 1558 119 lb 14.9 oz (54.4 kg)     Height 11/15/19 1558 5\' 1"  (1.549 m)     Head Circumference --      Peak Flow --      Pain Score 11/15/19 1558 8     Pain Loc --      Pain Edu? --      Excl. in [place]? --   TMAX(24)@       Latest  Blood pressure 134/66, pulse 77, temperature 98.7 F (37.1 C), temperature source Oral, resp. rate 18, height 5\' 1"  (1.549  m), weight 54.4 kg, SpO2 95 %.    Review of Systems:    Pertinent positives include: localizing neurological complaints,  weakness gait abnormality slurred speech, Constitutional:  No weight loss, night sweats, Fevers, chills, fatigue, weight loss  HEENT:  No headaches, Difficulty swallowing,Tooth/dental problems,Sore throat,  No sneezing, itching, ear ache, nasal congestion, post nasal drip,  Cardio-vascular:  No chest pain, Orthopnea, PND, anasarca, dizziness, palpitations.no Bilateral lower extremity swelling  GI:  No heartburn, indigestion, abdominal pain, nausea, vomiting, diarrhea, change in bowel habits, loss of appetite, melena, blood in stool, hematemesis Resp:  no shortness of breath at rest. No dyspnea on exertion, No excess mucus, no productive cough, No non-productive cough, No coughing up of blood.No change in color of mucus.No wheezing. Skin:  no rash or lesions. No jaundice GU:  no dysuria, change in color of urine, no urgency or frequency. No straining to  urinate.  No flank pain.  Musculoskeletal:  No joint pain or no joint swelling. No decreased range of motion. No back pain.  Psych:  No change in mood or affect. No depression or anxiety. No memory loss.  Neuro: no no tingling, no double vision   no confusion  All systems reviewed and apart from [place] all are negative  Past Medical History:   Past Medical History:  Diagnosis Date  . Alcoholic pancreatitis   . Asthma   . Depression   . Diabetes mellitus without complication ([place])   . DKA (diabetic ketoacidoses) 02/17/2015  . Emphysematous cystitis   . Hypercholesteremia   . Hypertension   . Hypokalemia       Past Surgical History:  Procedure Laterality Date  . ABDOMINAL HYSTERECTOMY  1996  . APPENDECTOMY  1997  . ESOPHAGOGASTRODUODENOSCOPY N/A 11/12/2017   Procedure: ESOPHAGOGASTRODUODENOSCOPY (EGD);  Surgeon: Lin Landsman, MD;  Location: [facility] ENDOSCOPY;  Service: Gastroenterology;  Laterality:  N/A;  . ESOPHAGOGASTRODUODENOSCOPY N/A 09/29/2018   Procedure: ESOPHAGOGASTRODUODENOSCOPY (EGD);  Surgeon: Toledo, Benay Pike, MD;  Location: [facility] ENDOSCOPY;  Service: Gastroenterology;  Laterality: N/A;  . ESOPHAGOGASTRODUODENOSCOPY (EGD) WITH PROPOFOL N/A 04/24/2016   Procedure: ESOPHAGOGASTRODUODENOSCOPY (EGD) WITH PROPOFOL;  Surgeon: [name], MD;  Location: [facility] ENDOSCOPY;  Service: Endoscopy;  Laterality: N/A;  . ESOPHAGOGASTRODUODENOSCOPY (EGD) WITH PROPOFOL N/A 01/28/2019   Procedure: ESOPHAGOGASTRODUODENOSCOPY (EGD) WITH PROPOFOL;  Surgeon: Toledo, Benay Pike, MD;  Location: [facility] ENDOSCOPY;  Service: Gastroenterology;  Laterality: N/A;  . EYE SURGERY    . HAND SURGERY  1988  . THYROID SURGERY  2013    Social History:  Ambulatory has at home walker       reports that she has quit smoking. She has never used smokeless tobacco. She reports previous alcohol use. She reports that she does not use drugs.   Family History:   Family History  Family history unknown: Yes    Allergies: Allergies  Allergen Reactions  . Penicillins Anaphylaxis    Has patient had a PCN reaction causing immediate rash, facial/tongue/throat swelling, SOB or lightheadedness with hypotension: Yes Has patient had a PCN reaction causing severe rash involving mucus membranes or skin necrosis: No Has patient had a PCN reaction that required hospitalization No Has patient had a PCN reaction occurring within the last 10 years: No If all of the above answers are "NO", then may proceed with Cephalosporin use.  . Reglan [Metoclopramide] Other (See Comments)    Hypotension, shortness of breath  . Fentanyl     Rash   . Percocet [Oxycodone-Acetaminophen] Rash    plain tylenol can also make itch?     Prior to Admission medications   Medication Sig Start Date End Date Taking? Authorizing Provider  amLODipine (NORVASC) 10 MG tablet Take 10 mg by mouth daily.    [provider]  cetirizine (ZYRTEC) 5 MG  tablet Take 1 tablet (5 mg total) by mouth daily. 10/14/19 11/13/19  Menshew, Dannielle Karvonen, PA-C  cyclobenzaprine (FLEXERIL) 5 MG tablet Take 5 mg by mouth 2 (two) times daily as needed for muscle spasms.     [provider]  docusate sodium (COLACE) 100 MG capsule Take 100 mg by mouth 2 (two) times daily.    [provider]  famotidine (PEPCID) 20 MG tablet Take 1 tablet (20 mg total) by mouth 2 (two) times daily. 07/01/19   Carrie Mew, MD  HYDROcodone-acetaminophen (NORCO/VICODIN) 5-325 MG tablet One tablet every four hours as needed for three days  then go back to you usual one tablet every six hours Patient taking differently: Take 1 tablet by mouth every 4 (four) hours as needed for moderate pain.  06/18/19   Wieting, Richard, MD  insulin aspart (NOVOLOG) 100 UNIT/ML FlexPen Inject 2 Units into the skin 3 (three) times daily with meals. 06/18/19   Loletha Grayer, MD  insulin glargine (LANTUS) 100 unit/mL SOPN Inject 0.2 mLs (20 Units total) into the skin at bedtime. 06/18/19   Wieting, Richard, MD  Insulin Pen Needle 29G X 12MM MISC 1 Dose by Does not apply route 3 (three) times daily before meals. 06/18/19   Loletha Grayer, MD  lipase/protease/amylase (CREON) 36000 UNITS CPEP capsule Take 2 capsules (72,000 Units total) by mouth 3 (three) times daily with meals. 01/29/19   Loletha Grayer, MD  omeprazole (PRILOSEC) 40 MG capsule Take 40 mg by mouth in the morning and at bedtime.    [provider]  ondansetron (ZOFRAN ODT) 4 MG disintegrating tablet Take 1 tablet (4 mg total) by mouth every 8 (eight) hours as needed for nausea or vomiting. 07/01/19   Carrie Mew, MD  Polyethylene Glycol 3350 POWD Take 17 g by mouth at bedtime. Mix 17 grams in 4-8 oz of liquid and drink     [provider]  pregabalin (LYRICA) 75 MG capsule Take 1 capsule (75 mg total) by mouth 2 (two) times daily. 08/05/19   Lorella Nimrod, MD  QUEtiapine (SEROQUEL) 25 MG tablet Take 25  mg by mouth as needed (DEPRESSION).     [provider]  rosuvastatin (CRESTOR) 20 MG tablet Take 1 tablet (20 mg total) by mouth daily. 08/05/19   Lorella Nimrod, MD  simethicone (MYLICON) 80 MG chewable tablet Chew 1 tablet (80 mg total) by mouth 4 (four) times daily. Patient not taking: Reported on 08/02/2019 06/18/19   Loletha Grayer, MD  triamcinolone cream (KENALOG) 0.5 % Apply twice a day to back of thighs 06/18/19   Loletha Grayer, MD  zolpidem (AMBIEN) 10 MG tablet Take 1 tablet by mouth at bedtime. 05/10/19   [provider]  GLUCERNA (GLUCERNA) LIQD Take 237 mLs by mouth 3 (three) times daily between meals. 02/19/15 02/27/15  Bettey Costa, MD   Physical Exam: Vitals with BMI 11/15/2019 11/15/2019 11/15/2019  Height - - -  Weight - - -  BMI - - -  Systolic 416 606 301  Diastolic 66 84 65  Pulse 77 - -     1. General:  in No Acute distress    Chronically ill -appearing 2. Psychological: Alert and  Oriented 3. Head/ENT:   Dry Mucous Membranes                          Head Non traumatic, neck supple                           Poor Dentition 4. SKIN:  decreased Skin turgor,  Skin clean Dry and intact no rash 5. Heart: Regular rate and rhythm no Murmur, no Rub or gallop 6. Lungs:  Clear to auscultation bilaterally, no wheezes or crackles   7. Abdomen: Soft, mild epigastric tender,   distended   Obese diminished bowel sounds present 8. Lower extremities: no clubbing, cyanosis, no  edema 9. Neurologically  strength 3out of 5 on the right extremities cranial nerves II through XII intact Do not appreciate facial droop at this time Right hand drift  mild noted  10. MSK: Normal range of motion   All other LABS:     Recent Labs  Lab 11/15/19 1606  WBC 8.8  NEUTROABS 5.1  HGB 11.7*  HCT 34.6*  MCV 85.4  PLT 250     Recent Labs  Lab 11/15/19 1606  NA 134*  K 4.4  CL 98  CO2 26  GLUCOSE 405*  BUN 42*  CREATININE 1.65*  CALCIUM 9.0     Recent Labs    Lab 11/15/19 1606  AST 48*  ALT 72*  ALKPHOS 284*  BILITOT 0.8  PROT 7.9  ALBUMIN 3.9       Cultures:    Component Value Date/Time   SDES  06/18/2019 1048    URINE, RANDOM Performed at [facility], [address], [place], [place] [address]    [facility]  06/18/2019 1048    NONE Performed at [facility], [address] [place], [place] [address]    CULT  06/18/2019 1048    NO GROWTH Performed at [facility], [place] [address], [place], [place] [address]    REPTSTATUS 06/19/2019 FINAL 06/18/2019 1048     Radiological Exams on Admission: CT HEAD WO CONTRAST  Result Date: 11/15/2019 CLINICAL DATA:  Right-sided facial droop EXAM: CT HEAD WITHOUT CONTRAST TECHNIQUE: Contiguous axial images were obtained from the base of the skull through the vertex without intravenous contrast. COMPARISON:  None. FINDINGS: Brain: No evidence of acute territorial infarction, hemorrhage, hydrocephalus,extra-axial collection or mass lesion/mass effect. Normal gray-white differentiation. Ventricles are normal in size and contour. Vascular: No hyperdense vessel or unexpected calcification. Skull: The skull is intact. No fracture or focal lesion identified. Sinuses/Orbits: The visualized paranasal sinuses and mastoid air cells are clear. The orbits and globes intact. Other: None IMPRESSION: No acute intracranial abnormality. Electronically Signed   By: Prudencio Pair M.D.   On: 11/15/2019 16:49    Chart has been reviewed    Assessment/Plan   64 y.o. female with medical history significant of chronic pancreatitis Pancreas divisum, HTN, DM 2, constipation, HLD, EtOh abuse in remission, asthma, DKA, CKD stage 3, chronic pain, chronic CHF grade 1 diastolic   Admitted for stroke-like symptoms  Present on Admission: . Stroke-like symptom -  - will admit based on TIA/CVA protocol,         Monitor on Tele     MRI await  Results         Carotid Doppler ordered Given CKD will  hold off on CTA       Echo to evaluate for possible embolic source,        obtain cardiac enzymes,  ECG,   Lipid panel, TSH.        Order PT/OT evaluation.          passed swallow eval        Will make sure patient is on antiplatelet ASA  325  and statin        Allow permissive Hypertension keep BP <220/120        Neurology consult   in AM  . Type II diabetes mellitus with renal manifestations (HCC) -  - Order Sensitive SSI   - continue home insulin regimen   -  check TSH and HgA1C    . Pancreatitis, recurrent/   Chronic pancreatitis ([place]) -continue Creon if able to tolerate p.o.'s.  Check lipase slightly elvated Follow LFTs, clear diet and pain control  . Hypertension -resume home medications when able to  tolerate allow permissive hypertension for tonight  . Hyperglycemia due to type 2 diabetes mellitus ([place]) -no evidence of DKA at this time we will continue to monitor and treat as needed will need diabetes coordinator consult   . HLD (hyperlipidemia) -continue crestor  . GERD (gastroesophageal reflux disease) -continue PPI  . Constipation -obtain KUB continue bowel Regimen  . CKD (chronic kidney disease), stage IIIa -  -chronic avoid nephrotoxic medications such as NSAIDs, Vanco Zosyn combo,  avoid hypotension, continue to follow renal function  . Abnormal LFTs -possibly in the setting of dehydration versus known history of alcohol abuse in remission.  Continue to follow check CK CK somewhat elevated Will rehydrate and follow repeat in a.m.    Other plan as per orders.  DVT prophylaxis:  SCD       Code Status:    Code Status: Prior FULL CODE   as per patient    I had personally discussed CODE STATUS with patient    Family Communication:   Family not at  Bedside   Disposition Plan:        To home once workup is complete and patient is stable   Following barriers for discharge:                            Electrolytes corrected                                                                                          Will need to be able to tolerate PO                                                        Will need consultants to evaluate patient prior to discharge                      Would benefit from PT/OT eval prior to DC  Ordered                   Swallow eval - SLP ordered                   Diabetes care coordinator                   Transition of care consulted                   Nutrition    consulted                                      Consults called:   Neurology aware will see in AM  Admission status:  ED Disposition    ED Disposition Condition [place]: [place] [100120]  Level of Care: Med-Surg [16]  Covid Evaluation: Confirmed COVID Negative  Diagnosis: Stroke-like symptom [786767]  Admitting Physician: Toy Baker [[id number]]  Attending Physician: Toy Baker [[id number]]        Obs    Level of care     tele  indefinitely please discontinue once patient no longer qualifies COVID-19 Labs    Lab Results  Component Value Date   [place] NEGATIVE 11/15/2019     Precautions: admitted as  Covid Negative       PPE: Used by the provider:   P100  eye Goggles,  Gloves       Tamella Tuccillo 11/15/2019, 9:35 PM    [facility]     after 2 AM please page floor coverage PA If 7AM-7PM, please contact the day team taking care of the patient using [email or web address]   Patient was evaluated in the context of the global COVID-19 pandemic, which necessitated consideration that the patient might be at risk for infection with the SARS-CoV-2 virus that causes COVID-19. Institutional protocols and algorithms that pertain to the evaluation of patients at risk for COVID-19 are in a state of rapid change based on information released by regulatory bodies including the CDC and federal and state organizations. These policies and algorithms were followed during the patient's  care.

## 2019-11-15 NOTE — ED Triage Notes (Signed)
Pt presents to ED via POV with c/o R side facial droop, R facial pain, slurred speech, and R eye pain, and drooling. Pt states was sent by eye doctor after being seen at [facility]. Pt states she does not remember when she did not have symptoms. Pt states called her eye doctor at approx 1400 to be seen for her eye pain.   Pt with mild R facial droop, no drift noted to bilateral arms, pt with noted L sided numbness when touched. Pt with weak but equal grip strength.   Pt very difficult to establish a [place], pt states she noticed symptoms when she awoke at 1230am, pt states LKW 11/9 at 1730.   Pt also c/o visual changes to R eye, states "it feels like it's getting smaller and it's irritating to look at bright lights".  Pt also c/o abdominal pain.

## 2019-11-15 NOTE — ED Triage Notes (Signed)
C/O RIGHT FACIAL DROOP, SLURRED SPEECH, DROOLING AND RIGHT EYE PAIN TODAY.  onset OF SYMPTOMS TODAY AT 0200 WHEN PATIENT WOKE UP

## 2019-11-16 ENCOUNTER — Observation Stay: Payer: Medicaid Other

## 2019-11-16 ENCOUNTER — Observation Stay: Admit: 2019-11-16 | Payer: Medicaid Other

## 2019-11-16 DIAGNOSIS — K5900 Constipation, unspecified: Secondary | ICD-10-CM | POA: Diagnosis not present

## 2019-11-16 DIAGNOSIS — K860 Alcohol-induced chronic pancreatitis: Secondary | ICD-10-CM | POA: Diagnosis not present

## 2019-11-16 DIAGNOSIS — I639 Cerebral infarction, unspecified: Secondary | ICD-10-CM | POA: Diagnosis not present

## 2019-11-16 DIAGNOSIS — R945 Abnormal results of liver function studies: Secondary | ICD-10-CM | POA: Diagnosis not present

## 2019-11-16 DIAGNOSIS — E785 Hyperlipidemia, unspecified: Secondary | ICD-10-CM | POA: Diagnosis not present

## 2019-11-16 LAB — COMPREHENSIVE METABOLIC PANEL
ALT: 57 U/L — ABNORMAL HIGH (ref 0–44)
AST: 46 U/L — ABNORMAL HIGH (ref 15–41)
Albumin: 3.2 g/dL — ABNORMAL LOW (ref 3.5–5.0)
Alkaline Phosphatase: 222 U/L — ABNORMAL HIGH (ref 38–126)
Anion gap: 7 (ref 5–15)
BUN: 36 mg/dL — ABNORMAL HIGH (ref 8–23)
CO2: 24 mmol/L (ref 22–32)
Calcium: 8.3 mg/dL — ABNORMAL LOW (ref 8.9–10.3)
Chloride: 107 mmol/L (ref 98–111)
Creatinine, Ser: 1.6 mg/dL — ABNORMAL HIGH (ref 0.44–1.00)
GFR, Estimated: 36 mL/min — ABNORMAL LOW (ref >60)
Glucose, Bld: 179 mg/dL — ABNORMAL HIGH (ref 70–99)
Potassium: 4.4 mmol/L (ref 3.5–5.1)
Sodium: 138 mmol/L (ref 135–145)
Total Bilirubin: 0.5 mg/dL (ref 0.3–1.2)
Total Protein: 6.3 g/dL — ABNORMAL LOW (ref 6.5–8.1)

## 2019-11-16 LAB — HEPATITIS PANEL, ACUTE
HCV Ab: NONREACTIVE
Hep A IgM: NONREACTIVE
Hep B C IgM: NONREACTIVE
Hepatitis B Surface Ag: NONREACTIVE

## 2019-11-16 LAB — LIPID PANEL
Cholesterol: 157 mg/dL (ref 0–200)
HDL: 46 mg/dL (ref >40)
LDL Cholesterol: 80 mg/dL (ref 0–99)
Total CHOL/HDL Ratio: 3.4 RATIO
Triglycerides: 155 mg/dL — ABNORMAL HIGH (ref <150)
VLDL: 31 mg/dL (ref 0–40)

## 2019-11-16 LAB — BETA-HYDROXYBUTYRIC ACID: Beta-Hydroxybutyric Acid: 0.08 mmol/L (ref 0.05–0.27)

## 2019-11-16 LAB — HEMOGLOBIN A1C
Hgb A1c MFr Bld: 11.9 % — ABNORMAL HIGH (ref 4.8–5.6)
Mean Plasma Glucose: 294.83 mg/dL

## 2019-11-16 LAB — CBG MONITORING, ED: Glucose-Capillary: 119 mg/dL — ABNORMAL HIGH (ref 70–99)

## 2019-11-16 LAB — GLUCOSE, CAPILLARY
Glucose-Capillary: 135 mg/dL — ABNORMAL HIGH (ref 70–99)
Glucose-Capillary: 199 mg/dL — ABNORMAL HIGH (ref 70–99)
Glucose-Capillary: 321 mg/dL — ABNORMAL HIGH (ref 70–99)
Glucose-Capillary: 251 mg/dL — ABNORMAL HIGH (ref 70–99)
Glucose-Capillary: 147 mg/dL — ABNORMAL HIGH (ref 70–99)

## 2019-11-16 LAB — CK: Total CK: 297 U/L — ABNORMAL HIGH (ref 38–234)

## 2019-11-16 LAB — MAGNESIUM: Magnesium: 2.2 mg/dL (ref 1.7–2.4)

## 2019-11-16 LAB — LIPASE, BLOOD: Lipase: 65 U/L — ABNORMAL HIGH (ref 11–51)

## 2019-11-16 MED ORDER — DIPHENHYDRAMINE HCL 50 MG/ML IJ SOLN
12.5000 mg | Freq: Once | INTRAMUSCULAR | Status: AC
  Administered 2019-11-16: 12.5 mg via INTRAVENOUS
  Filled 2019-11-16: qty 1

## 2019-11-16 MED ORDER — PROCHLORPERAZINE EDISYLATE 10 MG/2ML IJ SOLN
10.0000 mg | Freq: Once | INTRAMUSCULAR | Status: AC
  Administered 2019-11-16: 17:00:00 10 mg via INTRAVENOUS
  Filled 2019-11-16 (×2): qty 2

## 2019-11-16 MED ORDER — ZOLPIDEM TARTRATE 5 MG PO TABS: 5.0000 mg | ORAL_TABLET | Freq: Every evening | ORAL | Status: DC | PRN

## 2019-11-16 MED ORDER — FAMOTIDINE 20 MG PO TABS: 20.0000 mg | ORAL_TABLET | ORAL | Status: DC

## 2019-11-16 MED ORDER — ARIPIPRAZOLE 5 MG PO TABS
5.0000 mg | ORAL_TABLET | Freq: Every day | ORAL | Status: DC
  Administered 2019-11-16 – 2019-11-17 (×2): 5 mg via ORAL
  Filled 2019-11-16 (×3): qty 1

## 2019-11-16 MED ORDER — HYDRALAZINE HCL 20 MG/ML IJ SOLN: 10.0000 mg | Freq: Four times a day (QID) | INTRAMUSCULAR | Status: DC | PRN

## 2019-11-16 MED ORDER — INFLUENZA VAC SPLIT QUAD 0.5 ML IM SUSY
0.5000 mL | PREFILLED_SYRINGE | INTRAMUSCULAR | Status: AC
  Administered 2019-11-17: 12:00:00 0.5 mL via INTRAMUSCULAR
  Filled 2019-11-16: qty 0.5

## 2019-11-16 MED ORDER — AMLODIPINE BESYLATE 10 MG PO TABS
10.0000 mg | ORAL_TABLET | Freq: Every day | ORAL | Status: DC
  Administered 2019-11-16 – 2019-11-17 (×2): 10 mg via ORAL
  Filled 2019-11-16 (×2): qty 1

## 2019-11-16 MED ORDER — KETOROLAC TROMETHAMINE 30 MG/ML IJ SOLN: 30.0000 mg | Freq: Once | INTRAMUSCULAR | Status: DC

## 2019-11-16 NOTE — Consult Note (Signed)
Neurology Consultation Reason for Consult: Right sided weakness Referring Physician: Doristine Bosworth, R  CC: Right sided weakness  History is obtained from:Patient   HPI: Karen [name] is a 64 y.o. female with a history of headaches, previously being seen for refractory headaches she complains of right-sided ocular pain and retro-ocular pain as well as paresthesia and weakness affecting her right side.  She presented to me planing primarily of right facial "swelling" and weakness, and subsequently began demonstrating right arm and leg weakness as well.  Her symptoms persist and include tingling which started on her right fingertips before spreading.  She states that she has had tingling with headaches in the past as well.    ROS: A 14 point ROS was performed and is negative except as noted in the HPI.  Past Medical History:  Diagnosis Date  . Alcoholic pancreatitis   . Asthma   . Depression   . Diabetes mellitus without complication ([place])   . DKA (diabetic ketoacidoses) 02/17/2015  . Emphysematous cystitis   . Hypercholesteremia   . Hypertension   . Hypokalemia      Family History  Problem Relation Age of Onset  . Diabetes Mother      Social History:  reports that she has quit smoking. She has never used smokeless tobacco. She reports previous alcohol use. She reports that she does not use drugs.   Exam: Current vital signs: BP (!) 160/79 (BP Location: Right Arm)   Pulse 72   Temp 97.7 F (36.5 C) (Oral)   Resp 16   Ht 5\' 1"  (1.549 m)   Wt 54.4 kg   SpO2 100%   BMI 22.66 kg/m  Vital signs in last 24 hours: Temp:  [97.3 F (36.3 C)-98.7 F (37.1 C)] 97.7 F (36.5 C) (11/11 0839) Pulse Rate:  [57-82] 72 (11/11 0839) Resp:  [13-25] 16 (11/11 0839) BP: (112-160)/(50-84) 160/79 (11/11 0839) SpO2:  [91 %-100 %] 100 % (11/11 0839) Weight:  [54.4 kg] 54.4 kg (11/10 1558)   Physical Exam  Constitutional: Appears well-developed and well-nourished.  Psych: Affect appropriate  to situation Eyes: No scleral injection HENT: No OP obstrucion MSK: no joint deformities.  Cardiovascular: Normal rate and regular rhythm.  Respiratory: Effort normal, non-labored breathing GI: Soft.  No distension. There is no tenderness.  Skin: WDI  Neuro: Mental Status: Patient is awake, alert, oriented to person, place, month, year, and situation. Patient is able to give a clear and coherent history. No signs of aphasia or neglect Cranial Nerves: II: Visual Fields are full. Pupils are equal, round, and reactive to light.   III,IV, VI: EOMI without ptosis or diploplia.  V: Facial sensation is diminished on the right side to light touch, does not split midline to vibration VII: She does not move the right side of her face as well as the left, though when asked to puff out her cheeks, she holds the right side of her cheek taut VIII: hearing is intact to voice X: Uvula elevates symmetrically XI: Shoulder shrug is symmetric. XII: tongue is midline without atrophy or fasciculations.  Motor: Tone is normal. Bulk is normal.  She has giveaway weakness of the right arm and leg Sensory: Sensation is diminished on the right side light touch Cerebellar: No clear ataxia  I have reviewed labs in epic and the results pertinent to this consultation are: Creatinine 1.6  I have reviewed the images obtained: MRI brain-negative  Impression: 64 year old female with right-sided headache, paresthesia.  With her endorsement of  previous tingling with headaches, I suspect that this most likely represents complicated migraine and I will pursue treatment with Compazine/Benadryl.  Recommendations: 1) Compazine 10 mg IV x1, Benadryl 12.5 mg x 1 2) encouraged improvement in the patient 3) no further testing needed at this time.   Roland Rack, MD [facility] [phone]  If 7pm- 7am, please page neurology on call as listed in [place].

## 2019-11-16 NOTE — Plan of Care (Signed)
  Problem: Education: Goal: Knowledge of General Education information will improve Description: Including pain rating scale, medication(s)/side effects and non-pharmacologic comfort measures Outcome: Progressing   Problem: Clinical Measurements: Goal: Respiratory complications will improve Outcome: Progressing   Problem: Nutrition: Goal: Adequate nutrition will be maintained Outcome: Progressing   Problem: Elimination: Goal: Will not experience complications related to bowel motility Outcome: Progressing Goal: Will not experience complications related to urinary retention Outcome: Progressing   Problem: Pain Managment: Goal: General experience of comfort will improve Outcome: Progressing   Problem: Safety: Goal: Ability to remain free from injury will improve Outcome: Progressing   Problem: Skin Integrity: Goal: Risk for impaired skin integrity will decrease Outcome: Progressing

## 2019-11-16 NOTE — Evaluation (Signed)
Physical Therapy Evaluation Patient Details Name: Karen [name] MRN: [id number] DOB: Jun 19, 1955 Today's Date: 11/16/2019   History of Present Illness  Karen [name] is a 64 y.o. female with a history of DM2 w/ neuropathy, HLD, HTN, depression, EtOh abuse in remission, chronic pancreatitis, DKA, CKD statge 3, and asthma who is sent to the ED from ophthalmology due to facial droop, slurred speech, right-sided weakness that started at 5:30 PM yesterday.  Symptoms are constant without aggravating or alleviating factors.  Patient describes being off balance when she walks.  No trauma.  Denies vision change.  Complains of left-sided headache. MRI negative for acute infarct.  Clinical Impression  Pt admitted with above diagnosis. Pt currently with functional limitations due to the deficits listed below (see "PT Problem List"). Upon entry, pt in bed, awake and agreeable to participate. Pt familiar to author from prior admission for similar episode. The pt is alert and oriented x4, pleasant, conversational, and generally a vague historian.  Pt able to come to EOB, standing with supervision. Author provides minGuard assist for patient during AMB due to baseline gait imbalalnce, however pt has not LOB despite obvious unsteadiness. Functional mobility assessment demonstrates increased effort/time requirements, poor tolerance, and need for physical assistance, whereas the patient performed these at a higher level of independence PTA. Pt will benefit from skilled PT intervention to increase independence and safety with basic mobility in preparation for discharge to the venue listed below.       Follow Up Recommendations Outpatient PT    Equipment Recommendations  None recommended by PT    Recommendations for Other Services       Precautions / Restrictions Precautions Precautions: Fall Restrictions Weight Bearing Restrictions: No      Mobility  Bed Mobility Overal bed mobility: Needs Assistance Bed  Mobility: Supine to Sit;Sit to Supine     Supine to sit: Modified independent (Device/Increase time) Sit to supine: Modified independent (Device/Increase time)   General bed mobility comments: increased effort/time, pain limited but no assist needed    Transfers Overall transfer level: Needs assistance Equipment used: None Transfers: Sit to/from Stand Sit to Stand: Supervision            Ambulation/Gait Ambulation/Gait assistance: Min guard Gait Distance (Feet): 360 Feet Assistive device: None Gait Pattern/deviations: Ataxic;Decreased stance time - right Gait velocity: 0.26m/s   General Gait Details: left leg dominant gait, mild unsteadiness without LOB, falls anxiety  Stairs            Wheelchair Mobility    Modified Rankin (Stroke Patients Only)       Balance Overall balance assessment: Mild deficits observed, not formally tested;History of Falls;Modified Independent                                           Pertinent Vitals/Pain Pain Assessment: No/denies pain Pain Score: 9  Pain Location: pancreas, 7/10 R eye (recent cataracts surgery per pt report) Pain Descriptors / Indicators: Aching Pain Intervention(s): Limited activity within patient's tolerance;Monitored during session;Repositioned;Patient requesting pain meds-RN notified    Home Living Family/patient expects to be discharged to:: Private residence Living Arrangements: Spouse/significant other Available Help at Discharge: Family Type of Home: House Home Access: Stairs to enter Entrance Stairs-Rails: None Entrance Stairs-Number of Steps: 2 Home Layout: One level Home Equipment: Cane - single point;Grab bars - tub/shower;Walker - 2 wheels  Prior Function Level of Independence: Needs assistance   Gait / Transfers Assistance Needed: ambulates with RW vs SPC "sometimes" otherwise furniture walks, reports 1 fall day prior to admission 2/2 LOB  ADL's / Homemaking  Assistance Needed: Pt reports she is able to perform most bathing and dressing tasks without assist, PRN assist for socks/shoes and assist for bra mgt from spouse; spouse provides set up of medications and transportation as well        Hand Dominance   Dominant Hand: Right    Extremity/Trunk Assessment   Upper Extremity Assessment Upper Extremity Assessment: Overall WFL for tasks assessed    Lower Extremity Assessment Lower Extremity Assessment: Overall WFL for tasks assessed       Communication   Communication: No difficulties  Cognition Arousal/Alertness: Awake/alert Behavior During Therapy: WFL for tasks assessed/performed Overall Cognitive Status: Within Functional Limits for tasks assessed                                        General Comments      Exercises Other Exercises Other Exercises: toileting from standard toilet, no difficulty, ambulating in room aroud bed with RW with supervision   Assessment/Plan    PT Assessment Patient needs continued PT services  PT Problem List Decreased strength;Decreased range of motion;Decreased activity tolerance;Decreased balance;Decreased mobility;Decreased safety awareness       PT Treatment Interventions Balance training;Gait training;Stair training;Functional mobility training;Therapeutic activities;Therapeutic exercise;Patient/family education    PT Goals (Current goals can be found in the Care Plan section)  Acute Rehab PT Goals Patient Stated Goal: have less pain and go home PT Goal Formulation: With patient Time For Goal Achievement: 11/30/19 Potential to Achieve Goals: Good    Frequency Min 2X/week   Barriers to discharge        Co-evaluation               AM-PAC PT "6 Clicks" Mobility  Outcome Measure Help needed turning from your back to your side while in a flat bed without using bedrails?: None Help needed moving from lying on your back to sitting on the side of a flat bed  without using bedrails?: None Help needed moving to and from a bed to a chair (including a wheelchair)?: None Help needed standing up from a chair using your arms (e.g., wheelchair or bedside chair)?: A Little Help needed to walk in hospital room?: A Little Help needed climbing 3-5 steps with a railing? : A Little 6 Click Score: 21    End of Session Equipment Utilized During Treatment: Gait belt Activity Tolerance: Patient tolerated treatment well;Patient limited by fatigue;Other (comment) (right knee soreness) Patient left: in bed;with call bell/phone within reach Nurse Communication: Mobility status PT Visit Diagnosis: Unsteadiness on feet (R26.81);Difficulty in walking, not elsewhere classified (R26.2);Ataxic gait (R26.0)    Time: 1005-1018 PT Time Calculation (min) (ACUTE ONLY): 13 min   Charges:   PT Evaluation $PT Eval Moderate Complexity: 1 Mod PT Treatments $Gait Training: 8-22 mins      12:50 PM, 11/16/19 Etta Grandchild, PT, DPT Physical Therapist - [facility]  [phone] ([place])     Darria Corvera C 11/16/2019, 12:45 PM

## 2019-11-16 NOTE — Evaluation (Signed)
Speech Language Pathology Evaluation Patient Details Name: MURNA BACKER MRN: [id number] DOB: 30-Apr-1955 Today's Date: 11/16/2019 Time: 0947-0962 SLP Time Calculation (min) (ACUTE ONLY): 20 min  Problem List:  Patient Active Problem List   Diagnosis Date Noted  . Stroke-like symptom 11/15/2019  . Itching   . Diabetic polyneuropathy associated with type 2 diabetes mellitus ([place])   . Leukocytosis 06/13/2019  . Chronic kidney disease (CKD), stage II (mild)   . CKD (chronic kidney disease), stage IIIa 03/15/2019  . HTN (hypertension) 03/15/2019  . Type II diabetes mellitus with renal manifestations ([place]) 03/15/2019  . Headache 02/26/2019  . Dizziness 02/26/2019  . Hyperglycemia due to type 2 diabetes mellitus ([place]) 01/30/2019  . Acute pancreatitis 01/30/2019  . Constipation   . Acute on chronic pancreatitis ([place]) 01/25/2019  . Acute pain of left shoulder   . Adhesive capsulitis of left shoulder   . Abnormal LFTs 01/24/2019  . Cough 01/24/2019  . Hypertension   . HLD (hyperlipidemia)   . Type 2 diabetes mellitus with hyperlipidemia ([place])   . GERD (gastroesophageal reflux disease)   . Acute kidney injury superimposed on CKD ([place])   . Pancreatitis, recurrent   . Chronic pancreatitis ([place]) 09/25/2018  . 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 Coffee ground emesis   . Upper GI bleed 11/11/2017  . Pancreatitis, acute 01/03/2017  . ETOH abuse 01/03/2017  . UTI (urinary tract infection) 01/03/2017  . Nausea and vomiting   . Esophageal candidiasis ([place])   . Intractable vomiting with nausea   . Hypertensive urgency 04/21/2016  . Abdominal pain   . Nausea vomiting and diarrhea   . Smoker   . Poorly controlled type 2 diabetes mellitus ([place])   . Acute renal insufficiency 10/29/2015  . Elevated troponin 10/29/2015  . Urinary tract infection without hematuria 10/29/2015  . Left flank pain 10/29/2015  . Malignant essential hypertension 10/29/2015  . DKA (diabetic ketoacidoses)  02/17/2015  . Emphysematous cystitis    Past Medical History:  Past Medical History:  Diagnosis Date  . Alcoholic pancreatitis   . Asthma   . Depression   . Diabetes mellitus without complication ([place])   . DKA (diabetic ketoacidoses) 02/17/2015  . Emphysematous cystitis   . Hypercholesteremia   . Hypertension   . Hypokalemia    Past Surgical History:  Past Surgical History:  Procedure Laterality Date  . ABDOMINAL HYSTERECTOMY  1996  . APPENDECTOMY  1997  . ESOPHAGOGASTRODUODENOSCOPY N/A 11/12/2017   Procedure: ESOPHAGOGASTRODUODENOSCOPY (EGD);  Surgeon: Lin Landsman, MD;  Location: [facility] ENDOSCOPY;  Service: Gastroenterology;  Laterality: N/A;  . ESOPHAGOGASTRODUODENOSCOPY N/A 09/29/2018   Procedure: ESOPHAGOGASTRODUODENOSCOPY (EGD);  Surgeon: Toledo, Benay Pike, MD;  Location: [facility] ENDOSCOPY;  Service: Gastroenterology;  Laterality: N/A;  . ESOPHAGOGASTRODUODENOSCOPY (EGD) WITH PROPOFOL N/A 04/24/2016   Procedure: ESOPHAGOGASTRODUODENOSCOPY (EGD) WITH PROPOFOL;  Surgeon: [name], MD;  Location: [facility] ENDOSCOPY;  Service: Endoscopy;  Laterality: N/A;  . ESOPHAGOGASTRODUODENOSCOPY (EGD) WITH PROPOFOL N/A 01/28/2019   Procedure: ESOPHAGOGASTRODUODENOSCOPY (EGD) WITH PROPOFOL;  Surgeon: Toledo, Benay Pike, MD;  Location: [facility] ENDOSCOPY;  Service: Gastroenterology;  Laterality: N/A;  . EYE SURGERY    . HAND SURGERY  1988  . THYROID SURGERY  2013   HPI:  Pt is a 64 y.o. female with a history of DM2 w/ neuropathy, HLD, HTN, depression, EtOh abuse in remission, chronic pancreatitis, DKA, CKD statge 3, and asthma who is sent to the ED from ophthalmology due to facial droop, slurred speech, right-sided weakness that started at 5:30  PM yesterday.  Symptoms are constant without aggravating or alleviating factors.  Patient describes being off balance when she walks.  No trauma.  Denies vision change.  Complains of left-sided headache. MRI negative for acute infarct.   Assessment / Plan /  Recommendation Clinical Impression  MRI completed with no acute intracranial abnormalities noted. Pt presents with right side facial weakness but this doesn't impact her ability to express her wants/needs intelligibly. She does have some typical dialectal differences with pronunciation but her speech is not slurred. Her cognitive abilities appear to be functional. She is alert and oriented x 4, able to recall events that led to hospitalization and appears accurate with her descriptions of home environment. Her significant other is present 24 hours a day. At this time, skilled ST intervention is not indicated. Education was provided on contacting PCP for a referral to Outpatient ST should any further symptoms occur.     SLP Assessment  SLP Recommendation/Assessment: Patient does not need any further Speech Lanaguage Pathology Services SLP Visit Diagnosis: Cognitive communication deficit (R41.841)    Follow Up Recommendations  None    Frequency and Duration   N/A        SLP Evaluation Cognition  Overall Cognitive Status: Within Functional Limits for tasks assessed Arousal/Alertness: Awake/alert Orientation Level: Oriented X4       Comprehension  Auditory Comprehension Overall Auditory Comprehension: Appears within functional limits for tasks assessed Visual Recognition/Discrimination Discrimination: Within Function Limits Reading Comprehension Reading Status: Not tested    Expression Expression Primary Mode of Expression: Verbal Verbal Expression Overall Verbal Expression: Appears within functional limits for tasks assessed Written Expression Dominant Hand: Right Written Expression: Not tested   Oral / Motor  Oral Motor/Sensory Function Overall Oral Motor/Sensory Function: Mild impairment Facial ROM: Reduced right Facial Symmetry: Within Functional Limits Facial Strength: Reduced right Facial Sensation: Reduced right Lingual ROM: Within Functional Limits Lingual Symmetry:  Within Functional Limits Lingual Strength: Within Functional Limits Lingual Sensation: Within Functional Limits Velum: Within Functional Limits Mandible: Within Functional Limits Motor Speech Overall Motor Speech: Appears within functional limits for tasks assessed   GO                   Merrin Mcvicker B. Rutherford Nail M.S., CCC-SLP, [place] Office [phone]  Stormy Fabian 11/16/2019, 12:58 PM

## 2019-11-16 NOTE — Evaluation (Signed)
Clinical/Bedside Swallow Evaluation Patient Details  Name: Karen [name] MRN: [id number] Date of Birth: [birth year]  Today's Date: 11/16/2019 Time: SLP Start Time (ACUTE ONLY): 0907 SLP Stop Time (ACUTE ONLY): 0925 SLP Time Calculation (min) (ACUTE ONLY): 18 min  Past Medical History:  Past Medical History:  Diagnosis Date  . Alcoholic pancreatitis   . Asthma   . Depression   . Diabetes mellitus without complication ([place])   . DKA (diabetic ketoacidoses) 02/17/2015  . Emphysematous cystitis   . Hypercholesteremia   . Hypertension   . Hypokalemia    Past Surgical History:  Past Surgical History:  Procedure Laterality Date  . ABDOMINAL HYSTERECTOMY  1996  . APPENDECTOMY  1997  . ESOPHAGOGASTRODUODENOSCOPY N/A 11/12/2017   Procedure: ESOPHAGOGASTRODUODENOSCOPY (EGD);  Surgeon: Lin Landsman, MD;  Location[facility] END[facility]COPY;  Service: Gastroenterology;  Laterality: N/A;  . ESOPHAGOGASTRODUODENOSCOPY N/A 09/29/2018   Procedure: ESOPHAGOGASTRODUODENOSCOPY (EGD);  Surgeon: Toledo, Benay Pike, MD;  Location: [facility] ENDOSCOPY;  Service: Gastroenterology;  Laterality: N/A;  . ESOPHAGOGASTRODUODENOSCOPY (EGD) WITH PROPOFOL N/A 04/24/2016   Procedure: ESOPHAGOGASTRODUODENOSCOPY (EGD) WITH PROPOFOL;  Surgeon: [name], MD;  Location: [facility] ENDOSCOPY;  Service: Endoscopy;  Laterality: N/A;  . ESOPHAGOGASTRODUODENOSCOPY (EGD) WITH PROPOFOL N/A 01/28/2019   Procedure: ESOPHAGOGASTRODUODENOSCOPY (EGD) WITH PROPOFOL;  Surgeon: Toledo, Benay Pike, MD;  Location: [facility] ENDOSCOPY;  Service: Gastroenterology;  Laterality: N/A;  . EYE SURGERY    . HAND SURGERY  1988  . THYROID SURGERY  2013   HPI:  Pt is a 64 y.o. female with a history of DM2 w/ neuropathy, HLD, HTN, depression, EtOh abuse in remission, chronic pancreatitis, DKA, CKD statge 3, and asthma who is sent to the ED from ophthalmology due to facial droop, slurred speech, right-sided weakness that started at 5:30 PM yesterday.  Symptoms are constant  without aggravating or alleviating factors.  Patient describes being off balance when she walks.  No trauma.  Denies vision change.  Complains of left-sided headache. MRI negative for acute infarct.   Assessment / Plan / Recommendation Clinical Impression  ST consulted by MD d/t reported difficult eating. Pt presents with very mild right facial droop. Despite this, it doesn't impact her oral phase when consuming regular textures or thin liquids via straw. Pt does state that she had some right anterior spillage earlier when drinking from a cup but she "already figured it out, a straw works just fine." At this time, recommend regular diet, thin liquids and medicine whole with thin liquids. ST intervention is not indicated at this time.   SLP Visit Diagnosis: Dysphagia, oral phase (R13.11)    Aspiration Risk  No limitations    Diet Recommendation Regular;Thin liquid   Liquid Administration via: Straw Medication Administration: Whole meds with liquid Supervision: Patient able to self feed Compensations: Minimize environmental distractions;Slow rate;Small sips/bites Postural Changes: Seated upright at 90 degrees    Other  Recommendations Oral Care Recommendations: Oral care BID   Follow up Recommendations None      Frequency and Duration   N/A         Prognosis   N/A     Swallow Study   General Date of Onset: 11/16/19 HPI: Pt is a 64 y.o. female with a history of DM2 w/ neuropathy, HLD, HTN, depression, EtOh abuse in remission, chronic pancreatitis, DKA, CKD statge 3, and asthma who is sent to the ED from ophthalmology due to facial droop, slurred speech, right-sided weakness that started at 5:30 PM yesterday.  Symptoms are constant without aggravating or  alleviating factors.  Patient describes being off balance when she walks.  No trauma.  Denies vision change.  Complains of left-sided headache. MRI negative for acute infarct. Type of Study: Bedside Swallow Evaluation Previous Swallow  Assessment: none in chart Diet Prior to this Study: Thin liquids Temperature Spikes Noted: No Respiratory Status: Room air History of Recent Intubation: No Behavior/Cognition: Alert;Cooperative;Pleasant mood Oral Cavity Assessment: Within Functional Limits Oral Care Completed by SLP: No Oral Cavity - Dentition: Adequate natural dentition Vision: Functional for self-feeding Self-Feeding Abilities: Able to feed self Patient Positioning: Upright in bed Baseline Vocal Quality: Normal Volitional Cough: Strong Volitional Swallow: Able to elicit    Oral/Motor/Sensory Function Overall Oral Motor/Sensory Function: Mild impairment Facial ROM: Reduced right Facial Symmetry: Within Functional Limits Facial Strength: Reduced right Facial Sensation: Reduced right Lingual ROM: Within Functional Limits Lingual Symmetry: Within Functional Limits Lingual Strength: Within Functional Limits Lingual Sensation: Within Functional Limits Velum: Within Functional Limits Mandible: Within Functional Limits   Ice Chips Ice chips: Within functional limits Presentation: Self Fed;Spoon   Thin Liquid Thin Liquid: Within functional limits Presentation: Self Fed;Straw    Nectar Thick Nectar Thick Liquid: Not tested   Honey Thick Honey Thick Liquid: Not tested   Puree Puree: Within functional limits Presentation: Self Fed;Spoon   Solid     Solid: Within functional limits Presentation: Self Fed     Alvon Nygaard B. Rutherford Nail, M.S., CCC-SLP, [place] Office [phone]   Karen [name] 11/16/2019,12:50 PM

## 2019-11-16 NOTE — Plan of Care (Signed)
  Problem: Education: Goal: Knowledge of General Education information will improve Description: Including pain rating scale, medication(s)/side effects and non-pharmacologic comfort measures Outcome: Progressing   Problem: Clinical Measurements: Goal: Respiratory complications will improve Outcome: Progressing   Problem: Nutrition: Goal: Adequate nutrition will be maintained Outcome: Progressing   Problem: Elimination: Goal: Will not experience complications related to bowel motility Outcome: Progressing   Problem: Elimination: Goal: Will not experience complications related to urinary retention Outcome: Progressing   Problem: Pain Managment: Goal: General experience of comfort will improve Outcome: Progressing

## 2019-11-16 NOTE — Progress Notes (Signed)
PROGRESS NOTE    Karen [name]  QPY:[id number] DOB: Jan 26, 1955 DOA: 11/15/2019 PCP: Lavera Guise, MD   Brief Narrative:  Patient is 64 year old female with past medical history of chronic pancreatitis, pancreas divisum, hypertension, hyperlipidemia, type 2 diabetes mellitus, chronic constipation, alcohol abuse in remission, asthma, CKD stage III, chronic pain syndrome, chronic diastolic CHF, former smoker, GERD presents to emergency department with strokelike symptoms such as right facial droop, pain, slurred speech and drooling.  She went to eye doctor regarding her eye pain and was sent to ER for further evaluation and management.  In the ER: Patient was noted to have blood sugar of 405, slightly elevated LFTs, CT head negative for acute stroke.  Patient was given aspirin 325.  Patient admitted for further work-up for stroke like symptoms.  Assessment & Plan:  Strokelike symptoms: -Likely-complex migraine -CT head and MRI brain negative for acute infarction.  Reviewed MRA head.  Carotid Doppler and echo is ordered and is pending. -Consult PT/OT.  She passed bedside swallow evaluation -Appreciate neurology help-started on migraine cocktail-Compazine 10 mg IV x1, Benadryl 12.5 mg x 1. -Continue aspirin and statin -Neurochecks.  Uncontrolled diabetes mellitus: A1c 11.9%. -Unsure about patient's compliance. -Continue sliding scale insulin and Lantus 20 units daily at bedtime.  And monitor blood sugar closely.  Hypertension: -Resume amlodipine.  Monitor blood pressure closely -Hydralazine as needed for blood pressure more than 160/100.  CKD stage IIIb:  -Stable.  Avoid nephrotoxic medication.  Continue to monitor  Elevated liver enzymes: AST: 46, AST: 57, alkaline phosphatase: 222. -Likely in the setting of alcohol abuse. -check hepatitis panel -Repeat CMP tomorrow a.m.  Chronic pancreatitis: Lipase mildly elevated.  Patient denies abdominal pain.  -Continue IV fluids.  Zofran as  needed for nausea and vomiting.. -Continue Creon  GERD: Continue PPI  Hyperlipidemia: Continue statin  Chronic constipation: X-ray KUB shows nonobstructive bowel gas pattern.  Moderate stool burden throughout the colon. -Continue MiraLAX and Senokot  Bipolar/depression/anxiety: Continue home meds Abilify and Ambien  DVT prophylaxis: SCD  Code Status: Full code Family Communication: None present at bedside.  Plan of care discussed with patient in length and she verbalized understanding and agreed with it. Disposition Plan: Likely home in 1 to 2 days  Consultants:  Neurology  Procedures:   CT head  MRI brain  MRA head  Echo  Carotid Doppler  Antimicrobials:   None  Status is: Observation  Dispo: The patient is from: Home              Anticipated d/c is to: Home              Anticipated d/c date is: 11/17/2019              Patient currently not medically stable for the discharge.   Subjective: Patient seen and examined.  Sitting comfortably on the bed.  Tells me that she continues to have difficulty with eating, speech, numbness tingling sensation on right side of face and drooling.  Denies headache, dizziness, numbness weakness tingling sensation in hands or feet, previous history of a stroke, nausea, vomiting, chest pain, shortness of breath.  Objective: Vitals:   11/16/19 0017 11/16/19 0157 11/16/19 0607 11/16/19 0839  BP: (!) 112/50 131/65 (!) 147/82 (!) 160/79  Pulse: 72 74 77 72  Resp: (!) 22 20 18 16   Temp:  98.1 F (36.7 C) (!) 97.3 F (36.3 C) 97.7 F (36.5 C)  TempSrc:  Oral Oral Oral  SpO2: 94% 98% 100%  100%  Weight:      Height:        Intake/Output Summary (Last 24 hours) at 11/16/2019 1106 Last data filed at 11/16/2019 1038 Gross per 24 hour  Intake 989.83 ml  Output --  Net 989.83 ml   Filed Weights   11/15/19 1558  Weight: 54.4 kg    Examination:  General exam: Appears calm and comfortable, on room air.  Alert and oriented  x3. Respiratory system: Clear to auscultation. Respiratory effort normal. Cardiovascular system: S1 & S2 heard, RRR. No JVD, murmurs, rubs, gallops or clicks. No pedal edema. Gastrointestinal system: Abdomen is nondistended, soft and nontender. No organomegaly or masses felt. Normal bowel sounds heard. Central nervous system: Alert and oriented.  Decreased sensation on right side of face.  No facial droop noted. Extremities: Symmetric 5 x 5 power. Skin: No rashes, lesions or ulcers Psychiatry: Judgement and insight appear normal. Mood & affect appropriate.    Data Reviewed: I have personally reviewed following labs and imaging studies  CBC: Recent Labs  Lab 11/15/19 1606  WBC 8.8  NEUTROABS 5.1  HGB 11.7*  HCT 34.6*  MCV 85.4  PLT 174   Basic Metabolic Panel: Recent Labs  Lab 11/15/19 1606 11/16/19 0458  NA 134* 138  K 4.4 4.4  CL 98 107  CO2 26 24  GLUCOSE 405* 179*  BUN 42* 36*  CREATININE 1.65* 1.60*  CALCIUM 9.0 8.3*  MG  --  2.2   GFR: Estimated Creatinine Clearance: 27.5 mL/min (A) (by C-G formula based on SCr of 1.6 mg/dL (H)). Liver Function Tests: Recent Labs  Lab 11/15/19 1606 11/16/19 0458  AST 48* 46*  ALT 72* 57*  ALKPHOS 284* 222*  BILITOT 0.8 0.5  PROT 7.9 6.3*  ALBUMIN 3.9 3.2*   Recent Labs  Lab 11/15/19 1936 11/16/19 0458  LIPASE 71* 65*   No results for input(s): AMMONIA in the last 168 hours. Coagulation Profile: Recent Labs  Lab 11/15/19 1606  INR 0.9   Cardiac Enzymes: Recent Labs  Lab 11/15/19 1936 11/16/19 0458  CKTOTAL 571* 297*   BNP (last 3 results) No results for input(s): PROBNP in the last 8760 hours. HbA1C: Recent Labs    11/16/19 0458  HGBA1C 11.9*   CBG: Recent Labs  Lab 11/15/19 2014 11/16/19 0013 11/16/19 0339 11/16/19 0834  GLUCAP 405* 119* 135* 199*   Lipid Profile: Recent Labs    11/16/19 0458  CHOL 157  HDL 46  LDLCALC 80  TRIG 155*  CHOLHDL 3.4   Thyroid Function Tests: No  results for input(s): TSH, T4TOTAL, FREET4, T3FREE, THYROIDAB in the last 72 hours. Anemia Panel: No results for input(s): VITAMINB12, FOLATE, FERRITIN, TIBC, IRON, RETICCTPCT in the last 72 hours. Sepsis Labs: No results for input(s): PROCALCITON, LATICACIDVEN in the last 168 hours.  Recent Results (from the past 240 hour(s))  Respiratory Panel by RT PCR (Flu A&B, Covid) - Nasopharyngeal Swab     Status: None   Collection Time: 11/15/19  7:42 PM   Specimen: Nasopharyngeal Swab  Result Value Ref Range Status   SARS Coronavirus 2 by RT PCR NEGATIVE NEGATIVE Final    Comment: (NOTE) SARS-CoV-2 target nucleic acids are NOT DETECTED.  The SARS-CoV-2 RNA is generally detectable in upper respiratoy specimens during the acute phase of infection. The lowest concentration of SARS-CoV-2 viral copies this assay can detect is 131 copies/mL. A negative result does not preclude SARS-Cov-2 infection and should not be used as the sole basis for treatment  or other patient management decisions. A negative result may occur with  improper specimen collection/handling, submission of specimen other than nasopharyngeal swab, presence of viral mutation(s) within the areas targeted by this assay, and inadequate number of viral copies (<131 copies/mL). A negative result must be combined with clinical observations, patient history, and epidemiological information. The expected result is Negative.  Fact Sheet for Patients:  PinkCheek.be  Fact Sheet for Healthcare Providers:  GravelBags.it  This test is no t yet approved or cleared by the [place] FDA and  has been authorized for detection and/or diagnosis of SARS-CoV-2 by FDA under an Emergency Use Authorization (EUA). This EUA will remain  in effect (meaning this test can be used) for the duration of the COVID-19 declaration under Section 564(b)(1) of the Act, 21 U.S.C. section 360bbb-3(b)(1),  unless the authorization is terminated or revoked sooner.     Influenza A by PCR NEGATIVE NEGATIVE Final   Influenza B by PCR NEGATIVE NEGATIVE Final    Comment: (NOTE) The Xpert Xpress SARS-CoV-2/FLU/RSV assay is intended as an aid in  the diagnosis of influenza from Nasopharyngeal swab specimens and  should not be used as a sole basis for treatment. Nasal washings and  aspirates are unacceptable for Xpert Xpress SARS-CoV-2/FLU/RSV  testing.  Fact Sheet for Patients: PinkCheek.be  Fact Sheet for Healthcare Providers: GravelBags.it  This test is not yet approved or cleared by the [place] FDA and  has been authorized for detection and/or diagnosis of SARS-CoV-2 by  FDA under an Emergency Use Authorization (EUA). This EUA will remain  in effect (meaning this test can be used) for the duration of the  Covid-19 declaration under Section 564(b)(1) of the Act, 21  U.S.C. section 360bbb-3(b)(1), unless the authorization is  terminated or revoked. Performed at [facility], [address] [place], [place] [address]       Radiology Studies: DG Abd 1 View  Result Date: 11/15/2019 CLINICAL DATA:  Abdominal distension. EXAM: ABDOMEN - 1 VIEW COMPARISON:  CT AP 07/01/2019 FINDINGS: No dilated loops of small or large bowel. Moderate stool burden noted throughout the colon. No radio-opaque calculi or other significant radiographic abnormality are seen. IMPRESSION: 1. Nonobstructive bowel gas pattern. 2. Moderate stool burden noted throughout the colon. Electronically Signed   By: Kerby Moors M.D.   On: 11/15/2019 20:22   CT HEAD WO CONTRAST  Result Date: 11/15/2019 CLINICAL DATA:  Right-sided facial droop EXAM: CT HEAD WITHOUT CONTRAST TECHNIQUE: Contiguous axial images were obtained from the base of the skull through the vertex without intravenous contrast. COMPARISON:  None. FINDINGS: Brain: No evidence of acute  territorial infarction, hemorrhage, hydrocephalus,extra-axial collection or mass lesion/mass effect. Normal gray-white differentiation. Ventricles are normal in size and contour. Vascular: No hyperdense vessel or unexpected calcification. Skull: The skull is intact. No fracture or focal lesion identified. Sinuses/Orbits: The visualized paranasal sinuses and mastoid air cells are clear. The orbits and globes intact. Other: None IMPRESSION: No acute intracranial abnormality. Electronically Signed   By: Prudencio Pair M.D.   On: 11/15/2019 16:49   MR ANGIO HEAD WO CONTRAST  Result Date: 11/15/2019 CLINICAL DATA:  64 year old female with right side facial droop, slurred speech, right side weakness since 1730 hours yesterday. EXAM: MRA HEAD WITHOUT CONTRAST TECHNIQUE: Angiographic images of the Circle of Willis were obtained using MRA technique without intravenous contrast. COMPARISON:  CTA head and neck 10/14/2019. Intracranial MRA 02/26/2019. FINDINGS: Stable antegrade flow in the posterior circulation with mildly dominant distal left vertebral  artery. PICA origins remain patent. No significant distal vertebral stenosis. Patent basilar artery without stenosis. Motion artifact at the basilar tip. SCA and PCA origins appear to remain patent, but bilateral PCA branch detail is degraded. Still, distal PCA branch flow signal remains symmetric. Stable antegrade flow in both ICA siphons. Stable ICAs visible until just before the carotid termini where motion occurs. Moderate supraclinoid left ICA stenosis again suspected and stable. Patent ophthalmic artery origins. Patent but degraded bilateral MCA and ACA origins. Motion artifacts subsides at the level of the distal ACA and MCA branches which appears stable since February and within normal limits. IMPRESSION: 1. Today's exam is motion degraded at the level of the basilar tip and carotid termini. 2. Otherwise stable MRA appearance of intracranial atherosclerosis since  February. No large vessel occlusion is evident. Electronically Signed   By: Genevie Ann M.D.   On: 11/15/2019 22:25   MR BRAIN WO CONTRAST  Result Date: 11/15/2019 CLINICAL DATA:  64 year old female with right side facial droop, slurred speech, right side weakness since 1730 hours yesterday. EXAM: MRI HEAD WITHOUT CONTRAST TECHNIQUE: Multiplanar, multiecho pulse sequences of the brain and surrounding structures were obtained without intravenous contrast. COMPARISON:  Head CT earlier today. Brain MRI 04/08/2019 and earlier. FINDINGS: Brain: No restricted diffusion to suggest acute infarction. No midline shift, mass effect, evidence of mass lesion, ventriculomegaly, extra-axial collection or acute intracranial hemorrhage. Cervicomedullary junction and pituitary are within normal limits. Redemonstrated mild for age nonspecific mostly subcortical white matter T2 and FLAIR hyperintensity. As before this is most pronounced in the anterior superior left frontal gyrus. Clustered chronic microhemorrhages redemonstrated in the right occipital lobe. No new signal abnormality or hemosiderin identified. Deep gray nuclei, brainstem and cerebellum are within normal limits. Vascular: Major intracranial vascular flow voids remain stable. Skull and upper cervical spine: Motion artifact on sagittal imaging of the cervical spine today. Visualized bone marrow signal is within normal limits. Sinuses/Orbits: Postoperative changes to the right globe since February. Paranasalsinuses and mastoids are stable and well pneumatized (mild chronic left mastoid effusion. Other: Grossly normal visible internal auditory structures. Normal right stylomastoid foramen. Scalp and face soft tissues appear negative. IMPRESSION: 1. No acute intracranial abnormality. 2. Continued stable MRI appearance of the brain. Clustered micro hemorrhages in the right occipital lobe with otherwise mild for age nonspecific white matter signal changes. Electronically  Signed   By: Genevie Ann M.D.   On: 11/15/2019 22:20   DG Chest Portable 1 View  Result Date: 11/15/2019 CLINICAL DATA:  64 year old female with slurred speech. EXAM: PORTABLE CHEST 1 VIEW COMPARISON:  Chest radiograph dated 10/14/2019 FINDINGS: No focal consolidation, pleural effusion or pneumothorax. The cardiac silhouette is within limits. No acute osseous pathology. Surgical clips over the lower neck. IMPRESSION: No active disease. Electronically Signed   By: Anner Crete M.D.   On: 11/15/2019 20:22    Scheduled Meds: .  stroke: mapping our early stages of recovery book   Does not apply Once  . aspirin  300 mg Rectal Daily   Or  . aspirin  325 mg Oral Daily  . famotidine  20 mg Oral BID  . [START ON 11/17/2019] influenza vac split quadrivalent PF  0.5 mL Intramuscular Tomorrow-1000  . insulin aspart  0-9 Units Subcutaneous Q4H  . insulin glargine  20 Units Subcutaneous QHS  . lipase/protease/amylase  72,000 Units Oral TID WC  . pantoprazole  40 mg Oral Daily  . polyethylene glycol  17 g Oral BID  . pregabalin  75 mg Oral BID  . rosuvastatin  20 mg Oral q1800   Continuous Infusions: . sodium chloride 75 mL/hr at 11/16/19 0457     LOS: 0 days   Time spent: 40 minutes   Jasenia Weilbacher Loann Quill, MD [facility]  If 7PM-7AM, please contact night-coverage [email or web address] 11/16/2019, 11:06 AM

## 2019-11-16 NOTE — Progress Notes (Signed)
Pt ambulated to the BR, c/o slight dizziness but was steady on her feet.

## 2019-11-16 NOTE — Evaluation (Addendum)
Occupational Therapy Evaluation Patient Details Name: Karen [name] MRN: [id number] DOB: 1955-10-12 Today's Date: 11/16/2019    History of Present Illness Pt is a 64 y.o. female with a history of DM2 w/ neuropathy, HLD, HTN, depression, EtOh abuse in remission, chronic pancreatitis, DKA, CKD statge 3, and asthma who is sent to the ED from ophthalmology due to facial droop, slurred speech, right-sided weakness that started at 5:30 PM yesterday.  Symptoms are constant without aggravating or alleviating factors.  Patient describes being off balance when she walks.  No trauma.  Denies vision change.  Complains of left-sided headache. MRI negative for acute infarct.   Clinical Impression   Pt was seen for OT evaluation this date. Prior to hospital admission, pt was ambulating with AD as needed, getting assist for bra mgt, med mgt, PRN socks/shoes, and help for transportation from her spouse but otherwise able to perform basic ADL herself. Pt lives in a 1 story home with 2 steps to enter. Based on evaluation, pt near baseline, ambulating with RW in room with supervision, performs toileting and clothing mgt/LB dressing without difficulty, and no LOB in room. Pt agrees with near baseline assessment. Pt denies dizziness, no unilateral deficits appreciated, no visual deficits aside from R eye pain with pt reporting recent cataracts surgery. Pt currently requires RW for maximal safety with ADL mobility and remote supervision for ADL. Do not anticipate need for additional skilled OT Services at this time. Will sign off.     Follow Up Recommendations  No OT follow up;Supervision - Intermittent    Equipment Recommendations  None recommended by OT    Recommendations for Other Services       Precautions / Restrictions Precautions Precautions: Fall Restrictions Weight Bearing Restrictions: No      Mobility Bed Mobility Overal bed mobility: Needs Assistance Bed Mobility: Supine to Sit;Sit to Supine      Supine to sit: Supervision Sit to supine: Supervision   General bed mobility comments: increased effort/time, pain limited but no assist needed    Transfers Overall transfer level: Needs assistance Equipment used: Rolling walker (2 wheeled) Transfers: Sit to/from Stand Sit to Stand: Supervision              Balance Overall balance assessment: Mild deficits observed, not formally tested                                         ADL either performed or assessed with clinical judgement   ADL Overall ADL's : At baseline;Modified independent                                       General ADL Comments: Pt able to adjust socks without assist while sitting EOB, toileting with remote supervision for safety but no difficulty noted, ADL transfers with supervision but again no LOB noted; very close to baseline and pt endorses this     Vision Baseline Vision/History: Cataracts (pt reports recent R eye surgery for cataracts) Patient Visual Report: Eye fatigue/eye pain/headache Vision Assessment?: No apparent visual deficits Additional Comments: R eye pain 2/2 recent cataracts sx per pt report, requests eye patch for comfort, RN notified     Perception     Praxis      Pertinent Vitals/Pain Pain Assessment: 0-10 Pain Score: 9  Pain  Location: pancreas, 7/10 R eye (recent cataracts surgery per pt report) Pain Descriptors / Indicators: Aching Pain Intervention(s): Limited activity within patient's tolerance;Monitored during session;Repositioned;Patient requesting pain meds-RN notified     Hand Dominance Right   Extremity/Trunk Assessment Upper Extremity Assessment Upper Extremity Assessment: Generalized weakness   Lower Extremity Assessment Lower Extremity Assessment: Generalized weakness       Communication Communication Communication: No difficulties   Cognition Arousal/Alertness: Awake/alert Behavior During Therapy: WFL for tasks  assessed/performed Overall Cognitive Status: Within Functional Limits for tasks assessed                                     General Comments       Exercises Other Exercises Other Exercises: toileting from standard toilet, no difficulty, ambulating in room aroud bed with RW with supervision   Shoulder Instructions      Home Living Family/patient expects to be discharged to:: Private residence Living Arrangements: Spouse/significant other Available Help at Discharge: Family Type of Home: House Home Access: Stairs to enter Technical brewer of Steps: 2 Entrance Stairs-Rails: None Home Layout: One level     Bathroom Shower/Tub: Teacher, early years/pre: Standard     Home Equipment: Cane - single point;Grab bars - tub/shower;Walker - 2 wheels          Prior Functioning/Environment Level of Independence: Needs assistance  Gait / Transfers Assistance Needed: ambulates with RW vs SPC "sometimes" otherwise furniture walks, reports 1 fall day prior to admission 2/2 LOB ADL's / Homemaking Assistance Needed: Pt reports she is able to perform most bathing and dressing tasks without assist, PRN assist for socks/shoes and assist for bra mgt from spouse; spouse provides set up of medications and transportation as well            OT Problem List: Decreased strength      OT Treatment/Interventions:      OT Goals(Current goals can be found in the care plan section) Acute Rehab OT Goals Patient Stated Goal: have less pain and go home OT Goal Formulation: All assessment and education complete, DC therapy  OT Frequency:     Barriers to D/C:            Co-evaluation              AM-PAC OT "6 Clicks" Daily Activity     Outcome Measure Help from another person eating meals?: None Help from another person taking care of personal grooming?: None Help from another person toileting, which includes using toliet, bedpan, or urinal?: None Help from  another person bathing (including washing, rinsing, drying)?: None Help from another person to put on and taking off regular upper body clothing?: None Help from another person to put on and taking off regular lower body clothing?: None 6 Click Score: 24   End of Session Equipment Utilized During Treatment: Gait belt;Rolling walker  Activity Tolerance: Patient tolerated treatment well Patient left: in bed;with call bell/phone within reach;with bed alarm set  OT Visit Diagnosis: Other abnormalities of gait and mobility (R26.89)                Time: 3154-0086 OT Time Calculation (min): 22 min Charges:  OT General Charges $OT Visit: 1 Visit OT Evaluation $OT Eval Low Complexity: 1 Low OT Treatments $Self Care/Home Management : 8-22 mins  Jeni Salles, MPH, MS, OTR/L ascom [phone] 11/16/19, 10:31 AM

## 2019-11-16 NOTE — Plan of Care (Signed)
  Problem: Education: Goal: Knowledge of General Education information will improve Description: Including pain rating scale, medication(s)/side effects and non-pharmacologic comfort measures Outcome: Progressing   Problem: Health Behavior/Discharge Planning: Goal: Ability to manage health-related needs will improve Outcome: Progressing   Problem: Clinical Measurements: Goal: Ability to maintain clinical measurements within normal limits will improve Outcome: Progressing Goal: Will remain free from infection Outcome: Progressing Goal: Diagnostic test results will improve Outcome: Progressing Goal: Respiratory complications will improve Outcome: Progressing Goal: Cardiovascular complication will be avoided Outcome: Progressing   Problem: Activity: Goal: Risk for activity intolerance will decrease Outcome: Progressing   Problem: Nutrition: Goal: Adequate nutrition will be maintained Outcome: Progressing   Problem: Coping: Goal: Level of anxiety will decrease Outcome: Progressing   Problem: Elimination: Goal: Will not experience complications related to bowel motility Outcome: Progressing Goal: Will not experience complications related to urinary retention Outcome: Progressing   Problem: Pain Managment: Goal: General experience of comfort will improve Outcome: Progressing   Problem: Safety: Goal: Ability to remain free from injury will improve Outcome: Progressing   Problem: Education: Goal: Knowledge of disease or condition will improve Outcome: Progressing Goal: Knowledge of secondary prevention will improve Outcome: Progressing Goal: Knowledge of patient specific risk factors addressed and post discharge goals established will improve Outcome: Progressing   Problem: Coping: Goal: Will identify appropriate support needs Outcome: Progressing   Problem: Health Behavior/Discharge Planning: Goal: Ability to manage health-related needs will improve Outcome:  Progressing   Problem: Self-Care: Goal: Ability to participate in self-care as condition permits will improve Outcome: Progressing Goal: Verbalization of feelings and concerns over difficulty with self-care will improve Outcome: Progressing Goal: Ability to communicate needs accurately will improve Outcome: Progressing   Problem: Nutrition: Goal: Risk of aspiration will decrease Outcome: Progressing   Problem: Ischemic Stroke/TIA Tissue Perfusion: Goal: Complications of ischemic stroke/TIA will be minimized Outcome: Progressing

## 2019-11-17 ENCOUNTER — Observation Stay: Payer: Medicaid Other

## 2019-11-17 ENCOUNTER — Observation Stay
Admit: 2019-11-17 | Discharge: 2019-11-17 | Disposition: A | Discharge status: Home / Self Care | Payer: Medicaid Other | Attending: Internal Medicine | Admitting: Internal Medicine

## 2019-11-17 DIAGNOSIS — K860 Alcohol-induced chronic pancreatitis: Secondary | ICD-10-CM | POA: Diagnosis not present

## 2019-11-17 DIAGNOSIS — N1831 Chronic kidney disease, stage 3a: Secondary | ICD-10-CM | POA: Diagnosis not present

## 2019-11-17 DIAGNOSIS — G43109 Migraine with aura, not intractable, without status migrainosus: Secondary | ICD-10-CM | POA: Diagnosis not present

## 2019-11-17 DIAGNOSIS — R945 Abnormal results of liver function studies: Secondary | ICD-10-CM | POA: Diagnosis not present

## 2019-11-17 DIAGNOSIS — K5900 Constipation, unspecified: Secondary | ICD-10-CM | POA: Diagnosis not present

## 2019-11-17 LAB — COMPREHENSIVE METABOLIC PANEL
ALT: 60 U/L — ABNORMAL HIGH (ref 0–44)
AST: 57 U/L — ABNORMAL HIGH (ref 15–41)
Albumin: 3.1 g/dL — ABNORMAL LOW (ref 3.5–5.0)
Alkaline Phosphatase: 212 U/L — ABNORMAL HIGH (ref 38–126)
Anion gap: 5 (ref 5–15)
BUN: 36 mg/dL — ABNORMAL HIGH (ref 8–23)
CO2: 28 mmol/L (ref 22–32)
Calcium: 8.4 mg/dL — ABNORMAL LOW (ref 8.9–10.3)
Chloride: 104 mmol/L (ref 98–111)
Creatinine, Ser: 1.46 mg/dL — ABNORMAL HIGH (ref 0.44–1.00)
GFR, Estimated: 40 mL/min — ABNORMAL LOW (ref >60)
Glucose, Bld: 177 mg/dL — ABNORMAL HIGH (ref 70–99)
Potassium: 4.2 mmol/L (ref 3.5–5.1)
Sodium: 137 mmol/L (ref 135–145)
Total Bilirubin: 0.6 mg/dL (ref 0.3–1.2)
Total Protein: 6.4 g/dL — ABNORMAL LOW (ref 6.5–8.1)

## 2019-11-17 LAB — ECHOCARDIOGRAM COMPLETE
AR max vel: 1.86 cm2
AV Area VTI: 2.14 cm2
AV Area mean vel: 2.02 cm2
AV Mean grad: 4 mmHg
AV Peak grad: 7.2 mmHg
Ao pk vel: 1.35 m/s
Area-P 1/2: 4.29 cm2
Height: 61 in
S' Lateral: 2.63 cm
Weight: 1918.88 oz

## 2019-11-17 LAB — CBC
HCT: 34.1 % — ABNORMAL LOW (ref 36.0–46.0)
Hemoglobin: 11.2 g/dL — ABNORMAL LOW (ref 12.0–15.0)
MCH: 28.4 pg (ref 26.0–34.0)
MCHC: 32.8 g/dL (ref 30.0–36.0)
MCV: 86.3 fL (ref 80.0–100.0)
Platelets: 230 10*3/uL (ref 150–400)
RBC: 3.95 MIL/uL (ref 3.87–5.11)
RDW: 12.6 % (ref 11.5–15.5)
WBC: 9.9 10*3/uL (ref 4.0–10.5)
nRBC: 0 % (ref 0.0–0.2)

## 2019-11-17 LAB — GLUCOSE, CAPILLARY
Glucose-Capillary: 141 mg/dL — ABNORMAL HIGH (ref 70–99)
Glucose-Capillary: 164 mg/dL — ABNORMAL HIGH (ref 70–99)
Glucose-Capillary: 194 mg/dL — ABNORMAL HIGH (ref 70–99)
Glucose-Capillary: 75 mg/dL (ref 70–99)

## 2019-11-17 LAB — CK: Total CK: 181 U/L (ref 38–234)

## 2019-11-17 MED ORDER — ARIPIPRAZOLE 5 MG PO TABS: 5.0000 mg | ORAL_TABLET | Freq: Every day | ORAL | 0 refills | Status: AC

## 2019-11-17 MED ORDER — CETIRIZINE HCL 5 MG PO TABS: 5.0000 mg | ORAL_TABLET | Freq: Every day | ORAL | 0 refills | Status: AC

## 2019-11-17 MED ORDER — VASCEPA 0.5 G PO CAPS: 4.0000 | ORAL_CAPSULE | Freq: Two times a day (BID) | ORAL | 0 refills | Status: AC

## 2019-11-17 MED ORDER — AMLODIPINE BESYLATE 10 MG PO TABS
10.0000 mg | ORAL_TABLET | Freq: Every day | ORAL | 0 refills | Status: DC
Start: 2019-11-17 — End: 2022-02-16

## 2019-11-17 MED ORDER — PANCRELIPASE (LIP-PROT-AMYL) 36000-114000 UNITS PO CPEP: 72000.0000 [IU] | ORAL_CAPSULE | Freq: Three times a day (TID) | ORAL | 0 refills | Status: DC

## 2019-11-17 MED ORDER — DOCUSATE SODIUM 100 MG PO CAPS
100.0000 mg | ORAL_CAPSULE | Freq: Two times a day (BID) | ORAL | 0 refills | Status: AC
Start: 2019-11-17 — End: ?

## 2019-11-17 MED ORDER — FAMOTIDINE 20 MG PO TABS: 20.0000 mg | ORAL_TABLET | Freq: Two times a day (BID) | ORAL | 0 refills | Status: DC

## 2019-11-17 MED ORDER — ROSUVASTATIN CALCIUM 20 MG PO TABS
20.0000 mg | ORAL_TABLET | Freq: Every day | ORAL | 0 refills | Status: DC
Start: 2019-11-17 — End: 2022-03-18

## 2019-11-17 MED ORDER — INSULIN ASPART 100 UNIT/ML FLEXPEN: 2.0000 [IU] | PEN_INJECTOR | Freq: Three times a day (TID) | SUBCUTANEOUS | 0 refills | Status: DC

## 2019-11-17 MED ORDER — OMEPRAZOLE 40 MG PO CPDR
40.0000 mg | DELAYED_RELEASE_CAPSULE | Freq: Two times a day (BID) | ORAL | 0 refills | Status: DC
Start: 2019-11-17 — End: 2022-05-30

## 2019-11-17 MED ORDER — INSULIN GLARGINE 100 UNITS/ML SOLOSTAR PEN
20.0000 [IU] | PEN_INJECTOR | Freq: Every day | SUBCUTANEOUS | 0 refills | Status: DC
Start: 2019-11-17 — End: 2020-05-24

## 2019-11-17 MED ORDER — ONDANSETRON 4 MG PO TBDP: 4.0000 mg | ORAL_TABLET | Freq: Three times a day (TID) | ORAL | 0 refills | Status: DC | PRN

## 2019-11-17 MED ORDER — PREGABALIN 75 MG PO CAPS
75.0000 mg | ORAL_CAPSULE | Freq: Two times a day (BID) | ORAL | 0 refills | Status: DC
Start: 2019-11-17 — End: 2022-04-02

## 2019-11-17 MED ORDER — HYDROCODONE-ACETAMINOPHEN 5-325 MG PO TABS: ORAL_TABLET | ORAL | 0 refills | Status: DC

## 2019-11-17 NOTE — TOC Initial Note (Signed)
Transition of Care [facility]) - Initial/Assessment Note    Patient Details  Name: Karen [name] MRN: [id number] Date of Birth: 14-Aug-1955  Transition [facility] CM/SW Contact:    Magnus Ivan, LCSW Phone Number: 11/17/2019, 2:58 PM  Clinical Narrative:                CSW spoke with patient at bedside. Patient to discharge today and reported her boyfriend whom she lives with will provide transportation home. Patient reported she sees Dr. Humphrey Rolls for PCP. Pharmacy is [facility]. Patient is agreeable to Outpatient PT recommendation. CSW provided list of Outpatient PT options. Patient chooses [facility]. CSW will fax referral form.   Expected Discharge Plan: OP Rehab Barriers to Discharge: Barriers Resolved   Patient Goals and CMS Choice Patient states their goals for this hospitalization and ongoing recovery are:: outpatient PT CMS Medicare.gov Compare Post Acute Care list provided to:: Patient Choice offered to / list presented to : Patient  Expected Discharge Plan and Services Expected Discharge Plan: OP Rehab       Living arrangements for the past 2 months: Single Family Home Expected Discharge Date: 11/17/19                                    Prior Living Arrangements/Services Living arrangements for the past 2 months: Single Family Home Lives with:: Significant Other Patient language and need for interpreter reviewed:: Yes Do you feel safe going back to the place where you live?: Yes      Need for Family Participation in Patient Care: Yes (Comment) Care giver support system in place?: Yes (comment) Current home services: DME Criminal Activity/Legal Involvement Pertinent to Current Situation/Hospitalization: No - Comment as needed  Activities of Daily Living Home Assistive Devices/Equipment: None ADL Screening (condition at time of admission) Patient's cognitive ability adequate to safely complete daily activities?: Yes Is the patient deaf or have  difficulty hearing?: No Does the patient have difficulty seeing, even when wearing glasses/contacts?: Yes (Right eye) Does the patient have difficulty concentrating, remembering, or making decisions?: No Patient able to express need for assistance with ADLs?: Yes Does the patient have difficulty dressing or bathing?: No Independently performs ADLs?: Yes (appropriate for developmental age) Does the patient have difficulty walking or climbing stairs?: No Weakness of Legs: None Weakness of Arms/Hands: None  Permission Sought/Granted Permission sought to share information with : Chartered certified accountant granted to share information with : Yes, Verbal Permission Granted     Permission granted to share info w AGENCY: [facility]        Emotional Assessment       Orientation: : Oriented to Self, Oriented to Place, Oriented to  Time, Oriented to Situation Alcohol / Substance Use: Not Applicable Psych Involvement: No (comment)  Admission diagnosis:  Abdominal distension [R14.0] Stroke-like symptom [R29.90] Acute CVA (cerebrovascular accident) ([place]) [I63.9] Type 2 diabetes mellitus with hyperglycemia, with long-term current use of insulin ([place]) [E11.65, Z79.4] Patient Active Problem List   Diagnosis Date Noted  . Stroke-like symptom 11/15/2019  . Itching   . Diabetic polyneuropathy associated with type 2 diabetes mellitus ([place])   . Leukocytosis 06/13/2019  . Chronic kidney disease (CKD), stage II (mild)   . CKD (chronic kidney disease), stage IIIa 03/15/2019  . HTN (hypertension) 03/15/2019  . Type II diabetes mellitus with renal manifestations ([place]) 03/15/2019  . Headache 02/26/2019  .  Dizziness 02/26/2019  . Hyperglycemia due to type 2 diabetes mellitus ([place]) 01/30/2019  . Acute pancreatitis 01/30/2019  . Constipation   . Acute on chronic pancreatitis ([place]) 01/25/2019  . Acute pain of left shoulder   . Adhesive capsulitis of left shoulder   . Abnormal  LFTs 01/24/2019  . Cough 01/24/2019  . Hypertension   . HLD (hyperlipidemia)   . Type 2 diabetes mellitus with hyperlipidemia ([place])   . GERD (gastroesophageal reflux disease)   . Acute kidney injury superimposed on CKD ([place])   . Pancreatitis, recurrent   . Chronic pancreatitis ([place]) 09/25/2018  . 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 Coffee ground emesis   . Upper GI bleed 11/11/2017  . Pancreatitis, acute 01/03/2017  . ETOH abuse 01/03/2017  . UTI (urinary tract infection) 01/03/2017  . Nausea and vomiting   . Esophageal candidiasis ([place])   . Intractable vomiting with nausea   . Hypertensive urgency 04/21/2016  . Abdominal pain   . Nausea vomiting and diarrhea   . Smoker   . Poorly controlled type 2 diabetes mellitus ([place])   . Acute renal insufficiency 10/29/2015  . Elevated troponin 10/29/2015  . Urinary tract infection without hematuria 10/29/2015  . Left flank pain 10/29/2015  . Malignant essential hypertension 10/29/2015  . DKA (diabetic ketoacidoses) 02/17/2015  . Emphysematous cystitis    PCP:  Lavera Guise, MD Pharmacy:   [facility], [place] [address] Phone: [phone] Fax: [phone]     Social Determinants of Health (SDOH) Interventions    Readmission Risk Interventions Readmission Risk Prevention Plan 08/04/2019 06/15/2019  Transportation Screening Complete -  PCP or Specialist Appt within 3-5 Days Complete -  Social Work Consult for [place] Planning/Counseling Complete -  [place] Screening Not Applicable Not Applicable  Medication Review Press photographer) Complete Complete  Some recent data might be hidden

## 2019-11-17 NOTE — Progress Notes (Signed)
Subjective: Feels back to normal  Exam: Vitals:   11/17/19 0405 11/17/19 0718  BP: 135/74 (!) 141/77  Pulse: 70 64  Resp:  18  Temp: 98 F (36.7 C) 97.9 F (36.6 C)  SpO2: 96% 100%   Gen: In bed, NAD Resp: non-labored breathing, no acute distress Abd: soft, nt  Neuro: MS: Awake, alert, interactive and appropriate CN: Face symmetric, visual fields full Motor: No drift Sensory: Intact light touch  Impression: 64 year old female who presented with right-sided headache, paresthesia.  With her endorsement of previous tingling with headaches, as well as resolution with migraine cocktail, I think there is likely a component of complicated migraine.  Based on her exam, there may be some embellishment as well.   Recommendations: 1) no further recommendations at this time, she could follow-up with outpatient neurology for headache management.  Roland Rack, MD [facility] [phone]  If 7pm- 7am, please page neurology on call as listed in [place].

## 2019-11-17 NOTE — Discharge Summary (Signed)
Physician Discharge Summary  Karen [name] XNA:[id number] DOB: July 31, 1955 DOA: 11/15/2019  PCP: Lavera Guise, MD  Admit date: 11/15/2019 Discharge date: 11/17/2019  Admitted From: Home Disposition: Home  Recommendations for Outpatient Follow-up:  Follow-up with PCP in 3 days Outpatient PT Follow-up with neurology in 1 week for headache management Repeat CBC and CMP on follow-up visit Avoid alcohol  Home Health: None Equipment/Devices: None Discharge Condition: Stable CODE STATUS: Full code Diet recommendation: Cardiac healthy/carb consistent diet  Brief/Interim Summary: Patient is 64 year old female with past medical history of chronic pancreatitis, pancreas divisum, hypertension, hyperlipidemia, type 2 diabetes mellitus, chronic constipation, alcohol abuse in remission, asthma, CKD stage III, chronic pain syndrome, chronic diastolic CHF, former smoker, GERD presents to emergency department with strokelike symptoms such as right-sided headache, paresthesia, right facial droop, pain, slurred speech and drooling from the right side.  She went to eye doctor regarding her eye pain and was sent to ER for further evaluation and management.  In the ER: Patient was noted to have blood sugar of 405, slightly elevated LFTs, CT head negative for acute stroke.  Patient was given aspirin 325.  Neurology was consulted. Patient admitted for further work-up for stroke like symptoms.  Strokelike symptoms: -Likely-complex migraine -Her work-up including CT head and MRI brain, MRA head negative for acute findings. -Carotid Doppler: Reviewed-shows mild 1 to 49% stenosis in proximal right internal carotid artery and proximal left internal carotid artery.   -Transthoracic echo shows ejection fraction of 65 to 54%, normal diastolic parameters, no LVH. -Consulted PT/OT.  She passed bedside swallow evaluation -PT recommended outpatient PT.  OT recommended no OT follow-up. -Appreciated neurology help-patient  was given migraine cocktail-Compazine 10 mg IV x1, Benadryl 12.5 mg x 1.  Her symptoms improved.  No further recommendations from neurology standpoint recommended outpatient neurology for headache management.  Uncontrolled diabetes mellitus: A1c 11.9%. -Secondary to medication noncompliance -Patient started on sliding scale insulin and Lantus 20 units daily at bedtime. -Advised to follow-up with PCP outpatient  Hypertension: Initially elevated.  Resumed home dose of amlodipine.  Hydralazine was added as needed for blood pressure more than 100.  Her blood pressure remained stable.  Elevated liver enzymes: AST: 57, ALT: 60, alkaline phosphatase: 212, total bilirubin: WNL.  Hepatitis panel: Negative.  Right upper quadrant ultrasound ordered which shows hepatic hemangioma which is stable from the prior exam. -CK level elevated 571 upon admission.  Repeat CK level on 11/12 came back within normal limits.  Chronic pancreatitis: Lipase mildly elevated.  Patient denied abdominal pain.  -Continued Creon, IV fluids and Zofran as needed.  CKD stage IIIb:  -Improved .  Creatinine: 1.46, GFR: 40 (improved from creatinine: 1.65, GFR: 35)  GERD: Continued PPI  Hyperlipidemia: Continued statin  Chronic constipation: X-ray KUB shows nonobstructive bowel gas pattern.  Moderate stool burden throughout the colon. -Continued MiraLAX and Senokot  Bipolar/depression/anxiety: Continued home meds Abilify and Ambien  Please note: Patient requested for medication refills at the time of discharge until she sees her PCP.  Refills given on all of her home medications including Norco every 6 hours as needed for severe epigastric pain (8 tablets) and Lyrica twice daily for neuropathy (60 tablets).  Discharge Diagnoses:   Strokelike symptoms-likely secondary to complex migraine Uncontrolled diabetes mellitus Hypertension CKD stage IIIb Elevated liver enzymes Chronic  pancreatitis GERD Hyperlipidemia Chronic constipation Bipolar/depression/anxiety Chronic pain syndrome Alcohol abuse  Discharge Instructions  Discharge Instructions    Diet - low sodium heart healthy   Complete by:  As directed    Diet Carb Modified   Complete by: As directed    Discharge instructions   Complete by: As directed    Follow-up with PCP in 3 days Follow-up with neurology in 1 week Repeat CBC and CMP on follow-up visit Avoid alcohol   Increase activity slowly   Complete by: As directed      Allergies as of 11/17/2019      Reactions   Penicillins Anaphylaxis   Has patient had a PCN reaction causing immediate rash, facial/tongue/throat swelling, SOB or lightheadedness with hypotension: Yes Has patient had a PCN reaction causing severe rash involving mucus membranes or skin necrosis: No Has patient had a PCN reaction that required hospitalization No Has patient had a PCN reaction occurring within the last 10 years: No If all of the above answers are "NO", then may proceed with Cephalosporin use.   Reglan [metoclopramide] Other (See Comments)   Hypotension, shortness of breath   Fentanyl    Rash   Percocet [oxycodone-acetaminophen] Rash   plain tylenol can also make itch?      Medication List    STOP taking these medications   Nexlizet 180-10 MG Tabs Generic drug: Bempedoic Acid-Ezetimibe     TAKE these medications   amLODipine 10 MG tablet Commonly known as: NORVASC Take 1 tablet (10 mg total) by mouth daily.   ARIPiprazole 5 MG tablet Commonly known as: ABILIFY Take 1 tablet (5 mg total) by mouth daily.   cetirizine 5 MG tablet Commonly known as: ZYRTEC Take 1 tablet (5 mg total) by mouth daily.   cyclobenzaprine 5 MG tablet Commonly known as: FLEXERIL Take 5 mg by mouth 2 (two) times daily as needed for muscle spasms.   docusate sodium 100 MG capsule Commonly known as: COLACE Take 1 capsule (100 mg total) by mouth 2 (two) times daily.    famotidine 20 MG tablet Commonly known as: PEPCID Take 1 tablet (20 mg total) by mouth 2 (two) times daily. What changed: when to take this   HYDROcodone-acetaminophen 5-325 MG tablet Commonly known as: NORCO/VICODIN One tablet every 6 hours as needed for severe pain. What changed: additional instructions   insulin aspart 100 UNIT/ML FlexPen Commonly known as: NOVOLOG Inject 2 Units into the skin 3 (three) times daily with meals.   insulin glargine 100 unit/mL Sopn Commonly known as: LANTUS Inject 20 Units into the skin at bedtime. What changed: how much to take   Insulin Pen Needle 29G X 12MM Misc 1 Dose by Does not apply route 3 (three) times daily before meals.   lipase/protease/amylase 36000 UNITS Cpep capsule Commonly known as: CREON Take 2 capsules (72,000 Units total) by mouth 3 (three) times daily with meals.   omeprazole 40 MG capsule Commonly known as: PRILOSEC Take 1 capsule (40 mg total) by mouth in the morning and at bedtime.   ondansetron 4 MG disintegrating tablet Commonly known as: Zofran ODT Take 1 tablet (4 mg total) by mouth every 8 (eight) hours as needed for nausea or vomiting.   Polyethylene Glycol 3350 Powd Take 17 g by mouth at bedtime. Mix 17 grams in 4-8 oz of liquid and drink   pregabalin 75 MG capsule Commonly known as: LYRICA Take 1 capsule (75 mg total) by mouth 2 (two) times daily.   rosuvastatin 20 MG tablet Commonly known as: CRESTOR Take 1 tablet (20 mg total) by mouth daily. What changed: when to take this   simethicone 80 MG chewable tablet Commonly known  as: MYLICON Chew 1 tablet (80 mg total) by mouth 4 (four) times daily. What changed:   when to take this  reasons to take this   triamcinolone cream 0.5 % Commonly known as: KENALOG Apply twice a day to back of thighs   Vascepa 0.5 g Caps Generic drug: Icosapent Ethyl Take 4 capsules by mouth in the morning and at bedtime.   zolpidem 10 MG tablet Commonly known as:  AMBIEN Take 1 tablet by mouth at bedtime.       Allergies  Allergen Reactions  . Penicillins Anaphylaxis    Has patient had a PCN reaction causing immediate rash, facial/tongue/throat swelling, SOB or lightheadedness with hypotension: Yes Has patient had a PCN reaction causing severe rash involving mucus membranes or skin necrosis: No Has patient had a PCN reaction that required hospitalization No Has patient had a PCN reaction occurring within the last 10 years: No If all of the above answers are "NO", then may proceed with Cephalosporin use.  . Reglan [Metoclopramide] Other (See Comments)    Hypotension, shortness of breath  . Fentanyl     Rash   . Percocet [Oxycodone-Acetaminophen] Rash    plain tylenol can also make itch?    Consultations: Neurology  Procedures/Studies: DG Abd 1 View  Result Date: 11/15/2019 CLINICAL DATA:  Abdominal distension. EXAM: ABDOMEN - 1 VIEW COMPARISON:  CT AP 07/01/2019 FINDINGS: No dilated loops of small or large bowel. Moderate stool burden noted throughout the colon. No radio-opaque calculi or other significant radiographic abnormality are seen. IMPRESSION: 1. Nonobstructive bowel gas pattern. 2. Moderate stool burden noted throughout the colon. Electronically Signed   By: Kerby Moors M.D.   On: 11/15/2019 20:22   CT HEAD WO CONTRAST  Result Date: 11/15/2019 CLINICAL DATA:  Right-sided facial droop EXAM: CT HEAD WITHOUT CONTRAST TECHNIQUE: Contiguous axial images were obtained from the base of the skull through the vertex without intravenous contrast. COMPARISON:  None. FINDINGS: Brain: No evidence of acute territorial infarction, hemorrhage, hydrocephalus,extra-axial collection or mass lesion/mass effect. Normal gray-white differentiation. Ventricles are normal in size and contour. Vascular: No hyperdense vessel or unexpected calcification. Skull: The skull is intact. No fracture or focal lesion identified. Sinuses/Orbits: The visualized  paranasal sinuses and mastoid air cells are clear. The orbits and globes intact. Other: None IMPRESSION: No acute intracranial abnormality. Electronically Signed   By: Prudencio Pair M.D.   On: 11/15/2019 16:49   MR ANGIO HEAD WO CONTRAST  Result Date: 11/15/2019 CLINICAL DATA:  64 year old female with right side facial droop, slurred speech, right side weakness since 1730 hours yesterday. EXAM: MRA HEAD WITHOUT CONTRAST TECHNIQUE: Angiographic images of the Circle of [place] were obtained using MRA technique without intravenous contrast. COMPARISON:  CTA head and neck 10/14/2019. Intracranial MRA 02/26/2019. FINDINGS: Stable antegrade flow in the posterior circulation with mildly dominant distal left vertebral artery. PICA origins remain patent. No significant distal vertebral stenosis. Patent basilar artery without stenosis. Motion artifact at the basilar tip. SCA and PCA origins appear to remain patent, but bilateral PCA branch detail is degraded. Still, distal PCA branch flow signal remains symmetric. Stable antegrade flow in both ICA siphons. Stable ICAs visible until just before the carotid termini where motion occurs. Moderate supraclinoid left ICA stenosis again suspected and stable. Patent ophthalmic artery origins. Patent but degraded bilateral MCA and ACA origins. Motion artifacts subsides at the level of the distal ACA and MCA branches which appears stable since February and within normal limits. IMPRESSION: 1. Today's exam  is motion degraded at the level of the basilar tip and carotid termini. 2. Otherwise stable MRA appearance of intracranial atherosclerosis since February. No large vessel occlusion is evident. Electronically Signed   By: Genevie Ann M.D.   On: 11/15/2019 22:25   MR BRAIN WO CONTRAST  Result Date: 11/15/2019 CLINICAL DATA:  64 year old female with right side facial droop, slurred speech, right side weakness since 1730 hours yesterday. EXAM: MRI HEAD WITHOUT CONTRAST TECHNIQUE:  Multiplanar, multiecho pulse sequences of the brain and surrounding structures were obtained without intravenous contrast. COMPARISON:  Head CT earlier today. Brain MRI 04/08/2019 and earlier. FINDINGS: Brain: No restricted diffusion to suggest acute infarction. No midline shift, mass effect, evidence of mass lesion, ventriculomegaly, extra-axial collection or acute intracranial hemorrhage. Cervicomedullary junction and pituitary are within normal limits. Redemonstrated mild for age nonspecific mostly subcortical white matter T2 and FLAIR hyperintensity. As before this is most pronounced in the anterior superior left frontal gyrus. Clustered chronic microhemorrhages redemonstrated in the right occipital lobe. No new signal abnormality or hemosiderin identified. Deep gray nuclei, brainstem and cerebellum are within normal limits. Vascular: Major intracranial vascular flow voids remain stable. Skull and upper cervical spine: Motion artifact on sagittal imaging of the cervical spine today. Visualized bone marrow signal is within normal limits. Sinuses/Orbits: Postoperative changes to the right globe since February. Paranasalsinuses and mastoids are stable and well pneumatized (mild chronic left mastoid effusion. Other: Grossly normal visible internal auditory structures. Normal right stylomastoid foramen. Scalp and face soft tissues appear negative. IMPRESSION: 1. No acute intracranial abnormality. 2. Continued stable MRI appearance of the brain. Clustered micro hemorrhages in the right occipital lobe with otherwise mild for age nonspecific white matter signal changes. Electronically Signed   By: Genevie Ann M.D.   On: 11/15/2019 22:20   US Carotid Bilateral (at [facility] and AP only)  Result Date: 11/16/2019 CLINICAL DATA:  Facial droop, aphasia EXAM: BILATERAL CAROTID DUPLEX ULTRASOUND TECHNIQUE: Pearline Cables scale imaging, color Doppler and duplex ultrasound were performed of bilateral carotid and vertebral arteries in the  neck. COMPARISON:  None. FINDINGS: Criteria: Quantification of carotid stenosis is based on velocity parameters that correlate the residual internal carotid diameter with NASCET-based stenosis levels, using the diameter of the distal internal carotid lumen as the denominator for stenosis measurement. The following velocity measurements were obtained: RIGHT ICA: 133/50 cm/sec CCA: 36/64 cm/sec SYSTOLIC ICA/CCA RATIO:  1.5 ECA:  91 cm/sec LEFT ICA: 118/46 cm/sec CCA: 403/47 cm/sec SYSTOLIC ICA/CCA RATIO:  1.1 ECA:  122 cm/sec RIGHT CAROTID ARTERY: Heterogeneous and ulcerated atherosclerotic plaque in the proximal internal carotid artery. By peak systolic velocity criteria in the region of plaque, the estimated stenosis is less than 50%. Elevation of the peak systolic velocity in the more distal internal carotid artery in a region without atherosclerotic plaque is considered spurious. RIGHT VERTEBRAL ARTERY:  Patent with normal antegrade flow. LEFT CAROTID ARTERY: Heterogeneous but relatively smooth atherosclerotic plaque in the proximal internal carotid artery. By peak systolic velocity criteria, the estimated stenosis is less than 50%. LEFT VERTEBRAL ARTERY:  Patent with normal antegrade flow. IMPRESSION: 1. Mild (1-49%) stenosis proximal right internal carotid artery secondary to ulcerated heterogeneous atherosclerotic plaque. 2. Mild (1-49%) stenosis proximal left internal carotid artery secondary to focal heterogeneous atherosclerotic plaque. 3. Vertebral arteries are patent with normal antegrade flow. Signed, Criselda Peaches, MD, [facility] Electronically Signed   By: Jacqulynn Cadet M.D.   On: 11/16/2019 12:46   DG Chest  Portable 1 View  Result Date: 11/15/2019 CLINICAL DATA:  64 year old female with slurred speech. EXAM: PORTABLE CHEST 1 VIEW COMPARISON:  Chest radiograph dated 10/14/2019 FINDINGS: No focal consolidation, pleural  effusion or pneumothorax. The cardiac silhouette is within limits. No acute osseous pathology. Surgical clips over the lower neck. IMPRESSION: No active disease. Electronically Signed   By: Anner Crete M.D.   On: 11/15/2019 20:22   ECHOCARDIOGRAM COMPLETE  Result Date: 11/17/2019    ECHOCARDIOGRAM REPORT   Patient Name:   TEMPLE EWART Date of Exam: 11/17/2019 Medical Rec #:  [id number]    Height:       61.0 in Accession #:    [id number]   Weight:       119.9 lb Date of Birth:  Sep 18, 1955     BSA:          1.520 m Patient Age:    70 years     BP:           135/74 mmHg Patient Gender: F            HR:           70 bpm. Exam Location:  [facility] Procedure: 2D Echo, Cardiac Doppler and Color Doppler Indications:     Stroke 434.91  History:         Patient has prior history of Echocardiogram examinations, most                  recent 02/27/2019. Risk Factors:Hypertension and Diabetes.  Sonographer:     Sherrie Sport RDCS (AE) Referring Phys:  [place] Diagnosing Phys: Yolonda Kida MD IMPRESSIONS  1. Left ventricular ejection fraction, by estimation, is 65 to 70%. The left ventricle has normal function. The left ventricle has no regional wall motion abnormalities. Left ventricular diastolic parameters were normal.  2. Right ventricular systolic function is normal. The right ventricular size is normal.  3. The mitral valve is grossly normal. Trivial mitral valve regurgitation.  4. The aortic valve is normal in structure. Aortic valve regurgitation is not visualized. FINDINGS  Left Ventricle: Left ventricular ejection fraction, by estimation, is 65 to 70%. The left ventricle has normal function. The left ventricle has no regional wall motion abnormalities. The left ventricular internal cavity size was normal in size. There is  no left ventricular hypertrophy. Left ventricular diastolic parameters were normal. Right Ventricle: The right ventricular size is normal. No increase in right ventricular wall  thickness. Right ventricular systolic function is normal. Left Atrium: Left atrial size was normal in size. Right Atrium: Right atrial size was normal in size. Pericardium: There is no evidence of pericardial effusion. Mitral Valve: The mitral valve is grossly normal. Trivial mitral valve regurgitation. Tricuspid Valve: The tricuspid valve is normal in structure. Tricuspid valve regurgitation is trivial. Aortic Valve: The aortic valve is normal in structure. Aortic valve regurgitation is not visualized. Aortic valve mean gradient measures 4.0 mmHg. Aortic valve peak gradient measures 7.2 mmHg. Aortic valve area, by VTI measures 2.14 cm. Pulmonic Valve: The pulmonic valve was grossly normal. Pulmonic valve regurgitation is not visualized. Aorta: The aortic root is normal in size and structure. IAS/Shunts: No atrial level shunt detected by color flow Doppler.  LEFT VENTRICLE PLAX 2D LVIDd:         4.19 cm  Diastology LVIDs:         2.63 cm  LV e' medial:    5.33 cm/s LV PW:  1.04 cm  LV E/e' medial:  19.1 LV IVS:        0.65 cm  LV e' lateral:   6.96 cm/s LVOT diam:     2.00 cm  LV E/e' lateral: 14.7 LV SV:         64 LV SV Index:   42 LVOT Area:     3.14 cm  RIGHT VENTRICLE RV Basal diam:  4.03 cm RV S prime:     16.40 cm/s TAPSE (M-mode): 4.5 cm LEFT ATRIUM             Index       RIGHT ATRIUM           Index LA diam:        2.80 cm 1.84 cm/m  RA Area:     17.60 cm LA Vol (A2C):   43.3 ml 28.49 ml/m RA Volume:   50.50 ml  33.23 ml/m LA Vol (A4C):   34.3 ml 22.57 ml/m LA Biplane Vol: 39.6 ml 26.06 ml/m  AORTIC VALVE                   PULMONIC VALVE AV Area (Vmax):    1.86 cm    PV Vmax:        0.72 m/s AV Area (Vmean):   2.02 cm    PV Peak grad:   2.1 mmHg AV Area (VTI):     2.14 cm    RVOT Peak grad: 2 mmHg AV Vmax:           134.50 cm/s AV Vmean:          96.800 cm/s AV VTI:            0.300 m AV Peak Grad:      7.2 mmHg AV Mean Grad:      4.0 mmHg LVOT Vmax:         79.60 cm/s LVOT Vmean:         62.200 cm/s LVOT VTI:          0.204 m LVOT/AV VTI ratio: 0.68  AORTA Ao Root diam: 2.60 cm MITRAL VALVE                TRICUSPID VALVE MV Area (PHT): 4.29 cm     TR Peak grad:   36.2 mmHg MV Decel Time: 177 msec     TR Vmax:        301.00 cm/s MV E velocity: 102.00 cm/s MV A velocity: 115.00 cm/s  SHUNTS MV E/A ratio:  0.89         Systemic VTI:  0.20 m                             Systemic Diam: 2.00 cm Yolonda Kida MD Electronically signed by Yolonda Kida MD Signature Date/Time: 11/17/2019/11:26:15 AM    Final    US Abdomen Limited RUQ (LIVER/GB)  Result Date: 11/17/2019 CLINICAL DATA:  Elevated LFTs EXAM: ULTRASOUND ABDOMEN LIMITED RIGHT UPPER QUADRANT COMPARISON:  08/01/2019 FINDINGS: Gallbladder: No gallstones or wall thickening visualized. No sonographic Murphy sign noted by sonographer. Common bile duct: Diameter: 5 mm Liver: Focal hyperechoic mass is noted within the right lobe of the liver measuring 12 mm. This is stable from the prior exam and most consistent with a hepatic hemangioma. Portal vein is patent on color Doppler imaging with normal direction of blood flow towards the liver. Other: None IMPRESSION:  Stable hepatic hemangioma. No other focal abnormality is noted. Electronically Signed   By: Inez Catalina M.D.   On: 11/17/2019 10:34      Subjective: Patient seen and examined.  Tells me that she is feeling much better this morning.  Reports improvement in her symptoms after migraine cocktail.  Wishes to go home today.  Discharge Exam: Vitals:   11/17/19 0405 11/17/19 0718  BP: 135/74 (!) 141/77  Pulse: 70 64  Resp:  18  Temp: 98 F (36.7 C) 97.9 F (36.6 C)  SpO2: 96% 100%   Vitals:   11/16/19 2004 11/17/19 0040 11/17/19 0405 11/17/19 0718  BP: 119/60 118/63 135/74 (!) 141/77  Pulse: 68 63 70 64  Resp: 16   18  Temp: 98 F (36.7 C) 98 F (36.7 C) 98 F (36.7 C) 97.9 F (36.6 C)  TempSrc: Oral Oral Oral Oral  SpO2: 93%  96% 100%  Weight:      Height:         General: Pt is alert, awake, not in acute distress Cardiovascular: RRR, S1/S2 +, no rubs, no gallops Respiratory: CTA bilaterally, no wheezing, no rhonchi Abdominal: Soft, NT, ND, bowel sounds + Extremities: no edema, no cyanosis    The results of significant diagnostics from this hospitalization (including imaging, microbiology, ancillary and laboratory) are listed below for reference.     Microbiology: Recent Results (from the past 240 hour(s))  Respiratory Panel by RT PCR (Flu A&B, Covid) - Nasopharyngeal Swab     Status: None   Collection Time: 11/15/19  7:42 PM   Specimen: Nasopharyngeal Swab  Result Value Ref Range Status   SARS Coronavirus 2 by RT PCR NEGATIVE NEGATIVE Final    Comment: (NOTE) SARS-CoV-2 target nucleic acids are NOT DETECTED.  The SARS-CoV-2 RNA is generally detectable in upper respiratoy specimens during the acute phase of infection. The lowest concentration of SARS-CoV-2 viral copies this assay can detect is 131 copies/mL. A negative result does not preclude SARS-Cov-2 infection and should not be used as the sole basis for treatment or other patient management decisions. A negative result may occur with  improper specimen collection/handling, submission of specimen other than nasopharyngeal swab, presence of viral mutation(s) within the areas targeted by this assay, and inadequate number of viral copies (<131 copies/mL). A negative result must be combined with clinical observations, patient history, and epidemiological information. The expected result is Negative.  Fact Sheet for Patients:  PinkCheek.be  Fact Sheet for Healthcare Providers:  GravelBags.it  This test is no t yet approved or cleared by the [place] FDA and  has been authorized for detection and/or diagnosis of SARS-CoV-2 by FDA under an Emergency Use Authorization (EUA). This EUA will remain  in effect (meaning this  test can be used) for the duration of the COVID-19 declaration under Section 564(b)(1) of the Act, 21 U.S.C. section 360bbb-3(b)(1), unless the authorization is terminated or revoked sooner.     Influenza A by PCR NEGATIVE NEGATIVE Final   Influenza B by PCR NEGATIVE NEGATIVE Final    Comment: (NOTE) The Xpert Xpress SARS-CoV-2/FLU/RSV assay is intended as an aid in  the diagnosis of influenza from Nasopharyngeal swab specimens and  should not be used as a sole basis for treatment. Nasal washings and  aspirates are unacceptable for Xpert Xpress SARS-CoV-2/FLU/RSV  testing.  Fact Sheet for Patients: PinkCheek.be  Fact Sheet for Healthcare Providers: GravelBags.it  This test is not yet approved or cleared by the [place] and  has been authorized for detection and/or diagnosis of SARS-CoV-2 by  FDA under an Emergency Use Authorization (EUA). This EUA will remain  in effect (meaning this test can be used) for the duration of the  Covid-19 declaration under Section 564(b)(1) of the Act, 21  U.S.C. section 360bbb-3(b)(1), unless the authorization is  terminated or revoked. Performed at [facility], [address] [place], [place] [address]      Labs: BNP (last 3 results) Recent Labs    06/13/19 0729  BNP 17.5   Basic Metabolic Panel: Recent Labs  Lab 11/15/19 1606 11/16/19 0458 11/17/19 0442  NA 134* 138 137  K 4.4 4.4 4.2  CL 98 107 104  CO2 26 24 28   GLUCOSE 405* 179* 177*  BUN 42* 36* 36*  CREATININE 1.65* 1.60* 1.46*  CALCIUM 9.0 8.3* 8.4*  MG  --  2.2  --    Liver Function Tests: Recent Labs  Lab 11/15/19 1606 11/16/19 0458 11/17/19 0442  AST 48* 46* 57*  ALT 72* 57* 60*  ALKPHOS 284* 222* 212*  BILITOT 0.8 0.5 0.6  PROT 7.9 6.3* 6.4*  ALBUMIN 3.9 3.2* 3.1*   Recent Labs  Lab 11/15/19 1936 11/16/19 0458  LIPASE 71* 65*   No results for input(s): AMMONIA in the last  168 hours. CBC: Recent Labs  Lab 11/15/19 1606 11/17/19 0442  WBC 8.8 9.9  NEUTROABS 5.1  --   HGB 11.7* 11.2*  HCT 34.6* 34.1*  MCV 85.4 86.3  PLT 250 230   Cardiac Enzymes: Recent Labs  Lab 11/15/19 1936 11/16/19 0458 11/17/19 0442  CKTOTAL 571* 297* 181   BNP: Invalid input(s): POCBNP CBG: Recent Labs  Lab 11/16/19 1629 11/16/19 2006 11/17/19 0044 11/17/19 0409 11/17/19 0802  GLUCAP 251* 147* 141* 164* 75   D-Dimer No results for input(s): DDIMER in the last 72 hours. Hgb A1c Recent Labs    11/16/19 0458  HGBA1C 11.9*   Lipid Profile Recent Labs    11/16/19 0458  CHOL 157  HDL 46  LDLCALC 80  TRIG 155*  CHOLHDL 3.4   Thyroid function studies No results for input(s): TSH, T4TOTAL, T3FREE, THYROIDAB in the last 72 hours.  Invalid input(s): FREET3 Anemia work up No results for input(s): VITAMINB12, FOLATE, FERRITIN, TIBC, IRON, RETICCTPCT in the last 72 hours. Urinalysis    Component Value Date/Time   COLORURINE STRAW (A) 08/02/2019 0714   APPEARANCEUR HAZY (A) 08/02/2019 0714   APPEARANCEUR Hazy 08/11/2013 0729   LABSPEC 1.006 08/02/2019 0714   LABSPEC 1.030 08/11/2013 0729   PHURINE 7.0 08/02/2019 0714   GLUCOSEU 50 (A) 08/02/2019 0714   GLUCOSEU >=500 08/11/2013 0729   HGBUR NEGATIVE 08/02/2019 0714   BILIRUBINUR NEGATIVE 08/02/2019 0714   BILIRUBINUR Negative 08/11/2013 0729   KETONESUR NEGATIVE 08/02/2019 0714   PROTEINUR 100 (A) 08/02/2019 0714   NITRITE NEGATIVE 08/02/2019 0714   LEUKOCYTESUR MODERATE (A) 08/02/2019 0714   LEUKOCYTESUR Negative 08/11/2013 0729   Sepsis Labs Invalid input(s): PROCALCITONIN,  WBC,  LACTICIDVEN Microbiology Recent Results (from the past 240 hour(s))  Respiratory Panel by RT PCR (Flu A&B, Covid) - Nasopharyngeal Swab     Status: None   Collection Time: 11/15/19  7:42 PM   Specimen: Nasopharyngeal Swab  Result Value Ref Range Status   SARS Coronavirus 2 by RT PCR NEGATIVE NEGATIVE Final     Comment: (NOTE) SARS-CoV-2 target nucleic acids are NOT DETECTED.  The SARS-CoV-2 RNA is generally detectable in upper respiratoy specimens during the  acute phase of infection. The lowest concentration of SARS-CoV-2 viral copies this assay can detect is 131 copies/mL. A negative result does not preclude SARS-Cov-2 infection and should not be used as the sole basis for treatment or other patient management decisions. A negative result may occur with  improper specimen collection/handling, submission of specimen other than nasopharyngeal swab, presence of viral mutation(s) within the areas targeted by this assay, and inadequate number of viral copies (<131 copies/mL). A negative result must be combined with clinical observations, patient history, and epidemiological information. The expected result is Negative.  Fact Sheet for Patients:  PinkCheek.be  Fact Sheet for Healthcare Providers:  GravelBags.it  This test is no t yet approved or cleared by the [place] FDA and  has been authorized for detection and/or diagnosis of SARS-CoV-2 by FDA under an Emergency Use Authorization (EUA). This EUA will remain  in effect (meaning this test can be used) for the duration of the COVID-19 declaration under Section 564(b)(1) of the Act, 21 U.S.C. section 360bbb-3(b)(1), unless the authorization is terminated or revoked sooner.     Influenza A by PCR NEGATIVE NEGATIVE Final   Influenza B by PCR NEGATIVE NEGATIVE Final    Comment: (NOTE) The Xpert Xpress SARS-CoV-2/FLU/RSV assay is intended as an aid in  the diagnosis of influenza from Nasopharyngeal swab specimens and  should not be used as a sole basis for treatment. Nasal washings and  aspirates are unacceptable for Xpert Xpress SARS-CoV-2/FLU/RSV  testing.  Fact Sheet for Patients: PinkCheek.be  Fact Sheet for Healthcare  Providers: GravelBags.it  This test is not yet approved or cleared by the [place] FDA and  has been authorized for detection and/or diagnosis of SARS-CoV-2 by  FDA under an Emergency Use Authorization (EUA). This EUA will remain  in effect (meaning this test can be used) for the duration of the  Covid-19 declaration under Section 564(b)(1) of the Act, 21  U.S.C. section 360bbb-3(b)(1), unless the authorization is  terminated or revoked. Performed at [facility], [address] [place], [place] [address]      Time coordinating discharge: Over 30 minutes  SIGNED:   Mckinley Jewel, MD  [facility] 11/17/2019, 11:48 AM Pager   If 7PM-7AM, please contact night-coverage [email or web address]

## 2019-11-17 NOTE — Progress Notes (Signed)
*  PRELIMINARY RESULTS* Echocardiogram 2D Echocardiogram has been performed.  Sherrie Sport 11/17/2019, 8:24 AM

## 2019-11-17 NOTE — Progress Notes (Signed)
Pt verbalized understanding of all d/c instructions including follow up with neurology.

## 2019-11-22 ENCOUNTER — Encounter: Payer: Self-pay | Admitting: Emergency Medicine

## 2019-11-22 ENCOUNTER — Emergency Department
Admission: EM | Admit: 2019-11-22 | Discharge: 2019-11-22 | Disposition: A | Discharge status: Home Health | Payer: Medicaid Other | Attending: Emergency Medicine | Admitting: Emergency Medicine

## 2019-11-22 ENCOUNTER — Other Ambulatory Visit: Payer: Self-pay

## 2019-11-22 DIAGNOSIS — K861 Other chronic pancreatitis: Secondary | ICD-10-CM | POA: Diagnosis not present

## 2019-11-22 DIAGNOSIS — E1122 Type 2 diabetes mellitus with diabetic chronic kidney disease: Secondary | ICD-10-CM | POA: Insufficient documentation

## 2019-11-22 DIAGNOSIS — E785 Hyperlipidemia, unspecified: Secondary | ICD-10-CM | POA: Insufficient documentation

## 2019-11-22 DIAGNOSIS — R1013 Epigastric pain: Secondary | ICD-10-CM | POA: Insufficient documentation

## 2019-11-22 DIAGNOSIS — N1831 Chronic kidney disease, stage 3a: Secondary | ICD-10-CM | POA: Insufficient documentation

## 2019-11-22 DIAGNOSIS — Z87891 Personal history of nicotine dependence: Secondary | ICD-10-CM | POA: Insufficient documentation

## 2019-11-22 DIAGNOSIS — Z794 Long term (current) use of insulin: Secondary | ICD-10-CM | POA: Insufficient documentation

## 2019-11-22 DIAGNOSIS — E1169 Type 2 diabetes mellitus with other specified complication: Secondary | ICD-10-CM | POA: Insufficient documentation

## 2019-11-22 DIAGNOSIS — E1165 Type 2 diabetes mellitus with hyperglycemia: Secondary | ICD-10-CM | POA: Diagnosis not present

## 2019-11-22 DIAGNOSIS — I129 Hypertensive chronic kidney disease with stage 1 through stage 4 chronic kidney disease, or unspecified chronic kidney disease: Secondary | ICD-10-CM | POA: Insufficient documentation

## 2019-11-22 DIAGNOSIS — Z79899 Other long term (current) drug therapy: Secondary | ICD-10-CM | POA: Diagnosis not present

## 2019-11-22 LAB — CBC WITH DIFFERENTIAL/PLATELET
Abs Immature Granulocytes: 0.07 10*3/uL (ref 0.00–0.07)
Basophils Absolute: 0.1 10*3/uL (ref 0.0–0.1)
Basophils Relative: 1 %
Eosinophils Absolute: 0.3 10*3/uL (ref 0.0–0.5)
Eosinophils Relative: 4 %
HCT: 36.8 % (ref 36.0–46.0)
Hemoglobin: 12.5 g/dL (ref 12.0–15.0)
Immature Granulocytes: 1 %
Lymphocytes Relative: 26 %
Lymphs Abs: 2.1 10*3/uL (ref 0.7–4.0)
MCH: 29.1 pg (ref 26.0–34.0)
MCHC: 34 g/dL (ref 30.0–36.0)
MCV: 85.6 fL (ref 80.0–100.0)
Monocytes Absolute: 0.7 10*3/uL (ref 0.1–1.0)
Monocytes Relative: 8 %
Neutro Abs: 4.9 10*3/uL (ref 1.7–7.7)
Neutrophils Relative %: 60 %
Platelets: 250 10*3/uL (ref 150–400)
RBC: 4.3 MIL/uL (ref 3.87–5.11)
RDW: 12.1 % (ref 11.5–15.5)
WBC: 8.2 10*3/uL (ref 4.0–10.5)
nRBC: 0 % (ref 0.0–0.2)

## 2019-11-22 LAB — TROPONIN I (HIGH SENSITIVITY): Troponin I (High Sensitivity): 7 ng/L (ref <18)

## 2019-11-22 LAB — COMPREHENSIVE METABOLIC PANEL
ALT: 42 U/L (ref 0–44)
AST: 29 U/L (ref 15–41)
Albumin: 3.8 g/dL (ref 3.5–5.0)
Alkaline Phosphatase: 268 U/L — ABNORMAL HIGH (ref 38–126)
Anion gap: 10 (ref 5–15)
BUN: 65 mg/dL — ABNORMAL HIGH (ref 8–23)
CO2: 25 mmol/L (ref 22–32)
Calcium: 9.1 mg/dL (ref 8.9–10.3)
Chloride: 99 mmol/L (ref 98–111)
Creatinine, Ser: 1.91 mg/dL — ABNORMAL HIGH (ref 0.44–1.00)
GFR, Estimated: 29 mL/min — ABNORMAL LOW (ref >60)
Glucose, Bld: 750 mg/dL (ref 70–99)
Potassium: 4.3 mmol/L (ref 3.5–5.1)
Sodium: 134 mmol/L — ABNORMAL LOW (ref 135–145)
Total Bilirubin: 0.8 mg/dL (ref 0.3–1.2)
Total Protein: 7.7 g/dL (ref 6.5–8.1)

## 2019-11-22 LAB — LIPASE, BLOOD: Lipase: 111 U/L — ABNORMAL HIGH (ref 11–51)

## 2019-11-22 MED ORDER — HYDROCODONE-ACETAMINOPHEN 5-325 MG PO TABS: 1.0000 | ORAL_TABLET | ORAL | 0 refills | Status: AC | PRN

## 2019-11-22 MED ORDER — HYDROCODONE-ACETAMINOPHEN 5-325 MG PO TABS
1.0000 | ORAL_TABLET | Freq: Once | ORAL | Status: AC
  Administered 2019-11-22: 1 via ORAL
  Filled 2019-11-22: qty 1

## 2019-11-22 NOTE — ED Provider Notes (Addendum)
[facility] Provider Note   ____________________________________________   First MD Initiated Contact with Patient 11/22/19 [phone]     (approximate)  I have reviewed the triage vital signs and the nursing notes.   HISTORY  Chief Complaint Abdominal Pain    HPI Karen [name] is a 64 y.o. female with a stated past medical history of chronic alcoholic pancreatitis, type 2 diabetes, and hypertension who presents for midepigastric abdominal pain that began approximately 2 days prior to arrival and is described as 10/10, nonradiating, and associated with nausea and occasional nonbloody emesis.  Patient states that she was seen by her primary care doctor yesterday diagnosed with recurrence of her chronic pancreatitis.  Patient states that she was written for a course of hydrocodone however the prescription does not start until Saturday.  Patient states that she when she called her primary care physician's office she was told to present to the emergency department for any continued pain.  Patient denies any diarrhea, constipation, fevers, or recent sick contacts         Past Medical History:  Diagnosis Date  . Alcoholic pancreatitis   . Asthma   . Depression   . Diabetes mellitus without complication ([place])   . DKA (diabetic ketoacidoses) 02/17/2015  . Emphysematous cystitis   . Hypercholesteremia   . Hypertension   . Hypokalemia     Patient Active Problem List   Diagnosis Date Noted  . Stroke-like symptom 11/15/2019  . Itching   . Diabetic polyneuropathy associated with type 2 diabetes mellitus ([place])   . Leukocytosis 06/13/2019  . Chronic kidney disease (CKD), stage II (mild)   . CKD (chronic kidney disease), stage IIIa 03/15/2019  . HTN (hypertension) 03/15/2019  . Type II diabetes mellitus with renal manifestations ([place]) 03/15/2019  . Headache 02/26/2019  . Dizziness 02/26/2019  . Hyperglycemia due to type 2 diabetes mellitus  ([place]) 01/30/2019  . Acute pancreatitis 01/30/2019  . Constipation   . Acute on chronic pancreatitis ([place]) 01/25/2019  . Acute pain of left shoulder   . Adhesive capsulitis of left shoulder   . Abnormal LFTs 01/24/2019  . Cough 01/24/2019  . Hypertension   . HLD (hyperlipidemia)   . Type 2 diabetes mellitus with hyperlipidemia ([place])   . GERD (gastroesophageal reflux disease)   . Acute kidney injury superimposed on CKD ([place])   . Pancreatitis, recurrent   . Chronic pancreatitis ([place]) 09/25/2018  . 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 Coffee ground emesis   . Upper GI bleed 11/11/2017  . Pancreatitis, acute 01/03/2017  . ETOH abuse 01/03/2017  . UTI (urinary tract infection) 01/03/2017  . Nausea and vomiting   . Esophageal candidiasis ([place])   . Intractable vomiting with nausea   . Hypertensive urgency 04/21/2016  . Abdominal pain   . Nausea vomiting and diarrhea   . Smoker   . Poorly controlled type 2 diabetes mellitus ([place])   . Acute renal insufficiency 10/29/2015  . Elevated troponin 10/29/2015  . Urinary tract infection without hematuria 10/29/2015  . Left flank pain 10/29/2015  . Malignant essential hypertension 10/29/2015  . DKA (diabetic ketoacidoses) 02/17/2015  . Emphysematous cystitis     Past Surgical History:  Procedure Laterality Date  . ABDOMINAL HYSTERECTOMY  1996  . APPENDECTOMY  1997  . ESOPHAGOGASTRODUODENOSCOPY N/A 11/12/2017   Procedure: ESOPHAGOGASTRODUODENOSCOPY (EGD);  Surgeon: Lin Landsman, MD;  Location: [facility] ENDOSCOPY;  Service: Gastroenterology;  Laterality: N/A;  . ESOPHAGOGASTRODUODENOSCOPY N/A 09/29/2018  Procedure: ESOPHAGOGASTRODUODENOSCOPY (EGD);  Surgeon: Toledo, Benay Pike, MD;  Location: [facility] ENDOSCOPY;  Service: Gastroenterology;  Laterality: N/A;  . ESOPHAGOGASTRODUODENOSCOPY (EGD) WITH PROPOFOL N/A 04/24/2016   Procedure: ESOPHAGOGASTRODUODENOSCOPY (EGD) WITH PROPOFOL;  Surgeon: [name], MD;  Location: [facility] ENDOSCOPY;   Service: Endoscopy;  Laterality: N/A;  . ESOPHAGOGASTRODUODENOSCOPY (EGD) WITH PROPOFOL N/A 01/28/2019   Procedure: ESOPHAGOGASTRODUODENOSCOPY (EGD) WITH PROPOFOL;  Surgeon: Toledo, Benay Pike, MD;  Location: [facility] ENDOSCOPY;  Service: Gastroenterology;  Laterality: N/A;  . EYE SURGERY    . HAND SURGERY  1988  . THYROID SURGERY  2013    Prior to Admission medications   Medication Sig Start Date End Date Taking? Authorizing Provider  amLODipine (NORVASC) 10 MG tablet Take 1 tablet (10 mg total) by mouth daily. 11/17/19   Pahwani, Michell Heinrich, MD  ARIPiprazole (ABILIFY) 5 MG tablet Take 1 tablet (5 mg total) by mouth daily. 11/17/19   Pahwani, Michell Heinrich, MD  cetirizine (ZYRTEC) 5 MG tablet Take 1 tablet (5 mg total) by mouth daily. 11/17/19 12/17/19  Pahwani, Michell Heinrich, MD  cyclobenzaprine (FLEXERIL) 5 MG tablet Take 5 mg by mouth 2 (two) times daily as needed for muscle spasms.     [provider]  docusate sodium (COLACE) 100 MG capsule Take 1 capsule (100 mg total) by mouth 2 (two) times daily. 11/17/19   Pahwani, Michell Heinrich, MD  famotidine (PEPCID) 20 MG tablet Take 1 tablet (20 mg total) by mouth 2 (two) times daily. 11/17/19   Pahwani, Michell Heinrich, MD  HYDROcodone-acetaminophen (NORCO) 5-325 MG tablet Take 1 tablet by mouth every 4 (four) hours as needed for up to 3 days for moderate pain. 11/22/19 11/25/19  Naaman Plummer, MD  HYDROcodone-acetaminophen (NORCO/VICODIN) 5-325 MG tablet One tablet every 6 hours as needed for severe pain. 11/17/19   Pahwani, Michell Heinrich, MD  insulin aspart (NOVOLOG) 100 UNIT/ML FlexPen Inject 2 Units into the skin 3 (three) times daily with meals. 11/17/19   Pahwani, Michell Heinrich, MD  insulin glargine (LANTUS) 100 unit/mL SOPN Inject 20 Units into the skin at bedtime. 11/17/19   Pahwani, Michell Heinrich, MD  Insulin Pen Needle 29G X 12MM MISC 1 Dose by Does not apply route 3 (three) times daily before meals. 06/18/19   Loletha Grayer, MD  lipase/protease/amylase (CREON) 36000 UNITS  CPEP capsule Take 2 capsules (72,000 Units total) by mouth 3 (three) times daily with meals. 11/17/19   Pahwani, Michell Heinrich, MD  omeprazole (PRILOSEC) 40 MG capsule Take 1 capsule (40 mg total) by mouth in the morning and at bedtime. 11/17/19   Pahwani, Michell Heinrich, MD  ondansetron (ZOFRAN ODT) 4 MG disintegrating tablet Take 1 tablet (4 mg total) by mouth every 8 (eight) hours as needed for nausea or vomiting. 11/17/19   Pahwani, Michell Heinrich, MD  Polyethylene Glycol 3350 POWD Take 17 g by mouth at bedtime. Mix 17 grams in 4-8 oz of liquid and drink     [provider]  pregabalin (LYRICA) 75 MG capsule Take 1 capsule (75 mg total) by mouth 2 (two) times daily. 11/17/19   Pahwani, Michell Heinrich, MD  rosuvastatin (CRESTOR) 20 MG tablet Take 1 tablet (20 mg total) by mouth daily. 11/17/19   Pahwani, Michell Heinrich, MD  simethicone (MYLICON) 80 MG chewable tablet Chew 1 tablet (80 mg total) by mouth 4 (four) times daily. Patient taking differently: Chew 80 mg by mouth as needed.  06/18/19   Loletha Grayer, MD  triamcinolone cream (KENALOG) 0.5 % Apply twice  a day to back of thighs 06/18/19   Loletha Grayer, MD  VASCEPA 0.5 g CAPS Take 4 capsules by mouth in the morning and at bedtime. 11/17/19   Pahwani, Michell Heinrich, MD  zolpidem (AMBIEN) 10 MG tablet Take 1 tablet by mouth at bedtime. 05/10/19   [provider]  GLUCERNA (GLUCERNA) LIQD Take 237 mLs by mouth 3 (three) times daily between meals. 02/19/15 02/27/15  Bettey Costa, MD    Allergies Penicillins, Reglan [metoclopramide], Fentanyl, and Percocet [oxycodone-acetaminophen]  Family History  Problem Relation Age of Onset  . Diabetes Mother     Social History Social History   Tobacco Use  . Smoking status: Former Research scientist (life sciences)  . Smokeless tobacco: Never Used  . Tobacco comment: quit 7 years ago   Vaping Use  . Vaping Use: Never used  Substance Use Topics  . Alcohol use: Not Currently    Comment: quit drinking 2 years ago   . Drug use: No    Review  of Systems Constitutional: No fever/chills Eyes: No visual changes. ENT: No sore throat. Cardiovascular: Denies chest pain. Respiratory: Denies shortness of breath. Gastrointestinal: Endorses abdominal pain.  Endorses nausea, no vomiting.  No diarrhea. Genitourinary: Negative for dysuria. Musculoskeletal: Negative for acute arthralgias Skin: Negative for rash. Neurological: Negative for headaches, weakness/numbness/paresthesias in any extremity Psychiatric: Negative for suicidal ideation/homicidal ideation   ____________________________________________   PHYSICAL EXAM:  VITAL SIGNS: ED Triage Vitals [11/22/19 0601]  Enc Vitals Group     BP (!) 186/78     Pulse Rate 93     Resp 18     Temp 98.1 F (36.7 C)     Temp Source Oral     SpO2 99 %     Weight 123 lb (55.8 kg)     Height 5\' 1"  (1.549 m)     Head Circumference      Peak Flow      Pain Score 9     Pain Loc      Pain Edu?      Excl. in [place]?    Constitutional: Alert and oriented.  Tearful secondary to pain. Eyes: Conjunctivae are normal. PERRL. Head: Atraumatic. Nose: No congestion/rhinnorhea. Mouth/Throat: Mucous membranes are moist. Neck: No stridor Cardiovascular: Grossly normal heart sounds.  Good peripheral circulation. Respiratory: Normal respiratory effort.  No retractions. Gastrointestinal: Soft.  Mild deep midepigastric tenderness to palpation. No distention. Musculoskeletal: No obvious deformities Neurologic:  Normal speech and language. No gross focal neurologic deficits are appreciated. Skin:  Skin is warm and dry. No rash noted. Psychiatric: Mood and affect are normal. Speech and behavior are normal.  ____________________________________________   LABS (all labs ordered are listed, but only abnormal results are displayed)  Labs Reviewed  COMPREHENSIVE METABOLIC PANEL - Abnormal; Notable for the following components:      Result Value   Sodium 134 (*)    Glucose, Bld 750 (*)    BUN 65 (*)      Creatinine, Ser 1.91 (*)    Alkaline Phosphatase 268 (*)    GFR, Estimated 29 (*)    All other components within normal limits  LIPASE, BLOOD - Abnormal; Notable for the following components:   Lipase 111 (*)    All other components within normal limits  CBC WITH DIFFERENTIAL/PLATELET  URINALYSIS, COMPLETE (UACMP) WITH MICROSCOPIC  TROPONIN I (HIGH SENSITIVITY)   ____________________________________________  EKG  ED ECG REPORT I, Naaman Plummer, the attending physician, personally viewed and interpreted this ECG.  Date: 11/22/2019  EKG Time: 0602 Rate: 90 Rhythm: normal sinus rhythm QRS Axis: normal Intervals: normal ST/T Wave abnormalities: normal Narrative Interpretation: no evidence of acute ischemia   PROCEDURES  Procedure(s) performed (including Critical Care):  .1-3 Lead EKG Interpretation Performed by: Naaman Plummer, MD Authorized by: Naaman Plummer, MD     Interpretation: normal     ECG rate:  87   ECG rate assessment: normal     Rhythm: sinus rhythm     Ectopy: none     Conduction: normal       ____________________________________________   INITIAL IMPRESSION / ASSESSMENT AND PLAN / ED COURSE  As part of my medical decision making, I reviewed the following data within the [place] notes reviewed and incorporated, Labs reviewed, EKG interpreted, Old chart reviewed, Radiograph reviewed and Notes from prior ED visits reviewed and incorporated     Patient is a 64 year old female who presents for recurrence of her chronic pancreatitis with continued midepigastric pain Given history and exam I have a low suspicion for AAA, SBO, appendicitis, mesenteric ischemia, nephrolithiasis, pyelonephritis, or diverticulitis. I have a moderate concern for pancreatitis, gastritis vs non-bleeding peptic ulcer, or hepatobiliary disease and thus will obtain labs and imaging.  ED Workup: CBC, BMP, LFTs, Lipase  No signs of severe  pancreatitis on exam such as ecchymosis of periumbilical region or flanks, hypoxia, or tachypneia.  Reassessment: 0726 the patient has been reexamined and is ready to be discharged.  Pain well controlled with oral Norco.  All diagnostic results have been reviewed and discussed with the patient/family.  Care plan has been outlined and the patient/family understands all current diagnoses, results, and treatment plans.  There are no new complaints, changes, or physical findings at this time.  All questions have been addressed and answered.  All medications, if any, that were given while in the emergency department or any that are being prescribed have been reviewed with the patient/family.  All side effects and adverse reactions have been explained.  Patient was instructed to, and agrees to follow-up with their primary care physician, her gastroenterologist, as well as return to the emergency department if any new or worsening symptoms develop.   Disposition: Discharge     ____________________________________________   FINAL CLINICAL IMPRESSION(S) / ED DIAGNOSES  Final diagnoses:  Epigastric abdominal pain  Chronic recurrent pancreatitis [facility])     ED Discharge Orders         Ordered    HYDROcodone-acetaminophen (NORCO) 5-325 MG tablet  Every 4 hours PRN        11/22/19 0729         Addendum: 7062 I was called by patient's pharmacy who stated that patient has another prescription for hydrocodone that is scheduled to be picked up on Saturday and wanted to double check that patient will require an additional prescription in order to bridge her to her coming medications.  Per PMP aware, patient has a prescription that should last her through till Saturday however patient has been doubling her prescription due to increased pain.  Pharmacy was informed that if they had any further questions or concerns to contact patient's primary care provider  Note:  This document was prepared using Dragon  voice recognition software and may include unintentional dictation errors.   Naaman Plummer, MD 11/22/19 3762    Naaman Plummer, MD 11/22/19 [phone]

## 2019-11-22 NOTE — ED Notes (Signed)
E-sig pad unavailable.  Pt verbalized understanding of all discharge instructions and f/u care.

## 2019-11-22 NOTE — ED Notes (Signed)
CRITICAL VALUE STICKER  CRITICAL VALUE: Glucose 750  MD NOTIFIED: Dr Archie Balboa  TIME OF NOTIFICATION: [phone]  RESPONSE: awaiting response

## 2019-11-22 NOTE — ED Triage Notes (Signed)
Patient ambulatory to triage with steady gait, without difficulty or distress noted; pt reports mid upper abd pain since 230am; denies any accomp symptoms; hx pancreatitis and st ran out of her hydrocodone; st called pharmacy but they would not fill it until Saturday

## 2019-12-14 ENCOUNTER — Other Ambulatory Visit: Payer: Self-pay

## 2019-12-14 ENCOUNTER — Emergency Department
Admission: EM | Admit: 2019-12-14 | Discharge: 2019-12-14 | Disposition: A | Discharge status: Home / Self Care | Payer: Medicaid Other | Attending: Emergency Medicine | Admitting: Emergency Medicine

## 2019-12-14 DIAGNOSIS — Z5321 Procedure and treatment not carried out due to patient leaving prior to being seen by health care provider: Secondary | ICD-10-CM | POA: Diagnosis not present

## 2019-12-14 DIAGNOSIS — R112 Nausea with vomiting, unspecified: Secondary | ICD-10-CM | POA: Diagnosis not present

## 2019-12-14 DIAGNOSIS — R1030 Lower abdominal pain, unspecified: Secondary | ICD-10-CM | POA: Insufficient documentation

## 2019-12-14 DIAGNOSIS — R300 Dysuria: Secondary | ICD-10-CM | POA: Insufficient documentation

## 2019-12-14 DIAGNOSIS — R197 Diarrhea, unspecified: Secondary | ICD-10-CM | POA: Insufficient documentation

## 2019-12-14 NOTE — ED Triage Notes (Signed)
Pt in with co lower abd pain since yesterday and has had n.v.d. Pt is co burning on urination, pt has hx of uti.

## 2020-04-23 ENCOUNTER — Emergency Department
Admission: EM | Admit: 2020-04-23 | Discharge: 2020-04-23 | Disposition: A | Discharge status: Home / Self Care | Payer: Medicaid Other | Attending: Emergency Medicine | Admitting: Emergency Medicine

## 2020-04-23 ENCOUNTER — Emergency Department: Payer: Medicaid Other

## 2020-04-23 ENCOUNTER — Other Ambulatory Visit: Payer: Self-pay

## 2020-04-23 DIAGNOSIS — E1142 Type 2 diabetes mellitus with diabetic polyneuropathy: Secondary | ICD-10-CM | POA: Diagnosis not present

## 2020-04-23 DIAGNOSIS — I130 Hypertensive heart and chronic kidney disease with heart failure and stage 1 through stage 4 chronic kidney disease, or unspecified chronic kidney disease: Secondary | ICD-10-CM | POA: Diagnosis not present

## 2020-04-23 DIAGNOSIS — E1122 Type 2 diabetes mellitus with diabetic chronic kidney disease: Secondary | ICD-10-CM | POA: Insufficient documentation

## 2020-04-23 DIAGNOSIS — Z79899 Other long term (current) drug therapy: Secondary | ICD-10-CM | POA: Diagnosis not present

## 2020-04-23 DIAGNOSIS — N1831 Chronic kidney disease, stage 3a: Secondary | ICD-10-CM | POA: Insufficient documentation

## 2020-04-23 DIAGNOSIS — K861 Other chronic pancreatitis: Secondary | ICD-10-CM | POA: Diagnosis not present

## 2020-04-23 DIAGNOSIS — Z794 Long term (current) use of insulin: Secondary | ICD-10-CM | POA: Insufficient documentation

## 2020-04-23 DIAGNOSIS — R1013 Epigastric pain: Secondary | ICD-10-CM | POA: Diagnosis present

## 2020-04-23 DIAGNOSIS — J45909 Unspecified asthma, uncomplicated: Secondary | ICD-10-CM | POA: Diagnosis not present

## 2020-04-23 DIAGNOSIS — N3000 Acute cystitis without hematuria: Secondary | ICD-10-CM | POA: Insufficient documentation

## 2020-04-23 DIAGNOSIS — I5030 Unspecified diastolic (congestive) heart failure: Secondary | ICD-10-CM | POA: Diagnosis not present

## 2020-04-23 DIAGNOSIS — Z87891 Personal history of nicotine dependence: Secondary | ICD-10-CM | POA: Diagnosis not present

## 2020-04-23 LAB — CBC
HCT: 33.8 % — ABNORMAL LOW (ref 36.0–46.0)
Hemoglobin: 11.3 g/dL — ABNORMAL LOW (ref 12.0–15.0)
MCH: 29 pg (ref 26.0–34.0)
MCHC: 33.4 g/dL (ref 30.0–36.0)
MCV: 86.7 fL (ref 80.0–100.0)
Platelets: 277 10*3/uL (ref 150–400)
RBC: 3.9 MIL/uL (ref 3.87–5.11)
RDW: 12.4 % (ref 11.5–15.5)
WBC: 9.1 10*3/uL (ref 4.0–10.5)
nRBC: 0 % (ref 0.0–0.2)

## 2020-04-23 LAB — COMPREHENSIVE METABOLIC PANEL
ALT: 49 U/L — ABNORMAL HIGH (ref 0–44)
AST: 50 U/L — ABNORMAL HIGH (ref 15–41)
Albumin: 3.6 g/dL (ref 3.5–5.0)
Alkaline Phosphatase: 294 U/L — ABNORMAL HIGH (ref 38–126)
Anion gap: 9 (ref 5–15)
BUN: 49 mg/dL — ABNORMAL HIGH (ref 8–23)
CO2: 25 mmol/L (ref 22–32)
Calcium: 8.8 mg/dL — ABNORMAL LOW (ref 8.9–10.3)
Chloride: 104 mmol/L (ref 98–111)
Creatinine, Ser: 1.91 mg/dL — ABNORMAL HIGH (ref 0.44–1.00)
GFR, Estimated: 29 mL/min — ABNORMAL LOW (ref >60)
Glucose, Bld: 320 mg/dL — ABNORMAL HIGH (ref 70–99)
Potassium: 4.8 mmol/L (ref 3.5–5.1)
Sodium: 138 mmol/L (ref 135–145)
Total Bilirubin: 0.8 mg/dL (ref 0.3–1.2)
Total Protein: 8 g/dL (ref 6.5–8.1)

## 2020-04-23 LAB — URINALYSIS, COMPLETE (UACMP) WITH MICROSCOPIC
Bilirubin Urine: NEGATIVE
Glucose, UA: >=500 mg/dL — AB
Ketones, ur: NEGATIVE mg/dL
Nitrite: NEGATIVE
Protein, ur: 100 mg/dL — AB
Specific Gravity, Urine: 1.009 (ref 1.005–1.030)
WBC, UA: >50 WBC/hpf — ABNORMAL HIGH (ref 0–5)
pH: 6 (ref 5.0–8.0)

## 2020-04-23 LAB — LIPASE, BLOOD: Lipase: 79 U/L — ABNORMAL HIGH (ref 11–51)

## 2020-04-23 MED ORDER — SODIUM CHLORIDE 0.9 % IV SOLN
1.0000 g | Freq: Once | INTRAVENOUS | Status: AC
  Administered 2020-04-23: 1 g via INTRAVENOUS
  Filled 2020-04-23: qty 10

## 2020-04-23 MED ORDER — ONDANSETRON HCL 4 MG/2ML IJ SOLN
4.0000 mg | Freq: Once | INTRAMUSCULAR | Status: AC
  Administered 2020-04-23: 4 mg via INTRAVENOUS
  Filled 2020-04-23: qty 2

## 2020-04-23 MED ORDER — MORPHINE SULFATE (PF) 4 MG/ML IV SOLN
4.0000 mg | Freq: Once | INTRAVENOUS | Status: AC
  Administered 2020-04-23: 4 mg via INTRAVENOUS
  Filled 2020-04-23: qty 1

## 2020-04-23 MED ORDER — ONDANSETRON 4 MG PO TBDP: 4.0000 mg | ORAL_TABLET | Freq: Three times a day (TID) | ORAL | 0 refills | Status: DC | PRN

## 2020-04-23 MED ORDER — CEPHALEXIN 500 MG PO CAPS: 500.0000 mg | ORAL_CAPSULE | Freq: Two times a day (BID) | ORAL | 0 refills | Status: AC

## 2020-04-23 MED ORDER — LACTATED RINGERS IV BOLUS
1000.0000 mL | Freq: Once | INTRAVENOUS | Status: AC
  Administered 2020-04-23: 1000 mL via INTRAVENOUS

## 2020-04-23 NOTE — ED Notes (Signed)
Pt c/o LUQ pain with N/V since around 230-3am today, states she has a hx of pancreatitis. Pt c/o feeling bloated with abd distention noted. Pt is in NAD. Skin is warm and dry, respirations WNL.Marland Kitchen

## 2020-04-23 NOTE — ED Triage Notes (Addendum)
Pt comes with c/o left lower quad pain. Pt states this started early this am. Pt states hx of pancreatitis. Pt states out of current meds for this.  Pt states nausea and bloating.

## 2020-04-23 NOTE — ED Provider Notes (Signed)
[facility] Emergency Department Provider Note   ____________________________________________   Event Date/Time   First MD Initiated Contact with Patient 04/23/20 [phone]     (approximate)  I have reviewed the triage vital signs and the nursing notes.   HISTORY  Chief Complaint Abdominal Pain    HPI Karen [name] is a 65 y.o. female with past medical history of hypertension, hyperlipidemia, diabetes, CKD, asthma, diastolic CHF, and chronic pancreatitis who presents to the ED complaining of abdominal pain.  Patient reports flareup of her abdominal pain starting around 230 to 3:00 this morning.  She reports it is sharp and located in her epigastrium, present constantly and not exacerbated or alleviated by anything.  She has felt nauseous with multiple episodes of vomiting and diarrhea, denies any fevers.  She has not had any dysuria or flank pain.  She describes current symptoms as similar to prior episodes of pancreatitis, but she recently ran out of pain medication.  She denies any recent alcohol consumption.        Past Medical History:  Diagnosis Date  . Alcoholic pancreatitis   . Asthma   . Depression   . Diabetes mellitus without complication ([place])   . DKA (diabetic ketoacidoses) 02/17/2015  . Emphysematous cystitis   . Hypercholesteremia   . Hypertension   . Hypokalemia     Patient Active Problem List   Diagnosis Date Noted  . Stroke-like symptom 11/15/2019  . Itching   . Diabetic polyneuropathy associated with type 2 diabetes mellitus ([place])   . Leukocytosis 06/13/2019  . Chronic kidney disease (CKD), stage II (mild)   . CKD (chronic kidney disease), stage IIIa 03/15/2019  . HTN (hypertension) 03/15/2019  . Type II diabetes mellitus with renal manifestations ([place]) 03/15/2019  . Headache 02/26/2019  . Dizziness 02/26/2019  . Hyperglycemia due to type 2 diabetes mellitus ([place]) 01/30/2019  . Acute pancreatitis 01/30/2019  . Constipation   .  Acute on chronic pancreatitis ([place]) 01/25/2019  . Acute pain of left shoulder   . Adhesive capsulitis of left shoulder   . Abnormal LFTs 01/24/2019  . Cough 01/24/2019  . Hypertension   . HLD (hyperlipidemia)   . Type 2 diabetes mellitus with hyperlipidemia ([place])   . GERD (gastroesophageal reflux disease)   . Acute kidney injury superimposed on CKD ([place])   . Pancreatitis, recurrent   . Chronic pancreatitis ([place]) 09/25/2018  . 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 Coffee ground emesis   . Upper GI bleed 11/11/2017  . Pancreatitis, acute 01/03/2017  . ETOH abuse 01/03/2017  . UTI (urinary tract infection) 01/03/2017  . Nausea and vomiting   . Esophageal candidiasis ([place])   . Intractable vomiting with nausea   . Hypertensive urgency 04/21/2016  . Abdominal pain   . Nausea vomiting and diarrhea   . Smoker   . Poorly controlled type 2 diabetes mellitus ([place])   . Acute renal insufficiency 10/29/2015  . Elevated troponin 10/29/2015  . Urinary tract infection without hematuria 10/29/2015  . Left flank pain 10/29/2015  . Malignant essential hypertension 10/29/2015  . DKA (diabetic ketoacidoses) 02/17/2015  . Emphysematous cystitis     Past Surgical History:  Procedure Laterality Date  . ABDOMINAL HYSTERECTOMY  1996  . APPENDECTOMY  1997  . ESOPHAGOGASTRODUODENOSCOPY N/A 11/12/2017   Procedure: ESOPHAGOGASTRODUODENOSCOPY (EGD);  Surgeon: Lin Landsman, MD;  Location: [facility] ENDOSCOPY;  Service: Gastroenterology;  Laterality: N/A;  . ESOPHAGOGASTRODUODENOSCOPY N/A 09/29/2018   Procedure: ESOPHAGOGASTRODUODENOSCOPY (EGD);  Surgeon: [place],  Benay Pike, MD;  Location: [facility] ENDOSCOPY;  Service: Gastroenterology;  Laterality: N/A;  . ESOPHAGOGASTRODUODENOSCOPY (EGD) WITH PROPOFOL N/A 04/24/2016   Procedure: ESOPHAGOGASTRODUODENOSCOPY (EGD) WITH PROPOFOL;  Surgeon: [name], MD;  Location: [facility] ENDOSCOPY;  Service: Endoscopy;  Laterality: N/A;  . ESOPHAGOGASTRODUODENOSCOPY (EGD)  WITH PROPOFOL N/A 01/28/2019   Procedure: ESOPHAGOGASTRODUODENOSCOPY (EGD) WITH PROPOFOL;  Surgeon: [place], Benay Pike, MD;  Location: [facility] ENDOSCOPY;  Service: Gastroenterology;  Laterality: N/A;  . EYE SURGERY    . HAND SURGERY  1988  . THYROID SURGERY  2013    Prior to Admission medications   Medication Sig Start Date End Date Taking? Authorizing Provider  cephALEXin (KEFLEX) 500 MG capsule Take 1 capsule (500 mg total) by mouth 2 (two) times daily for 7 days. 04/23/20 04/30/20 Yes Blake Divine, MD  ondansetron (ZOFRAN ODT) 4 MG disintegrating tablet Take 1 tablet (4 mg total) by mouth every 8 (eight) hours as needed for nausea or vomiting. 04/23/20  Yes Blake Divine, MD  amLODipine (NORVASC) 10 MG tablet Take 1 tablet (10 mg total) by mouth daily. 11/17/19   Pahwani, Michell Heinrich, MD  ARIPiprazole (ABILIFY) 5 MG tablet Take 1 tablet (5 mg total) by mouth daily. 11/17/19   Pahwani, Michell Heinrich, MD  cetirizine (ZYRTEC) 5 MG tablet Take 1 tablet (5 mg total) by mouth daily. 11/17/19 12/17/19  Pahwani, Michell Heinrich, MD  cyclobenzaprine (FLEXERIL) 5 MG tablet Take 5 mg by mouth 2 (two) times daily as needed for muscle spasms.     [provider]  docusate sodium (COLACE) 100 MG capsule Take 1 capsule (100 mg total) by mouth 2 (two) times daily. 11/17/19   Pahwani, Michell Heinrich, MD  famotidine (PEPCID) 20 MG tablet Take 1 tablet (20 mg total) by mouth 2 (two) times daily. 11/17/19   Pahwani, Michell Heinrich, MD  HYDROcodone-acetaminophen (NORCO/VICODIN) 5-325 MG tablet One tablet every 6 hours as needed for severe pain. 11/17/19   Pahwani, Michell Heinrich, MD  insulin aspart (NOVOLOG) 100 UNIT/ML FlexPen Inject 2 Units into the skin 3 (three) times daily with meals. 11/17/19   Pahwani, Michell Heinrich, MD  insulin glargine (LANTUS) 100 unit/mL SOPN Inject 20 Units into the skin at bedtime. 11/17/19   Pahwani, Michell Heinrich, MD  Insulin Pen Needle 29G X 12MM MISC 1 Dose by Does not apply route 3 (three) times daily before meals. 06/18/19    Loletha Grayer, MD  lipase/protease/amylase (CREON) 36000 UNITS CPEP capsule Take 2 capsules (72,000 Units total) by mouth 3 (three) times daily with meals. 11/17/19   Pahwani, Michell Heinrich, MD  omeprazole (PRILOSEC) 40 MG capsule Take 1 capsule (40 mg total) by mouth in the morning and at bedtime. 11/17/19   Pahwani, Michell Heinrich, MD  Polyethylene Glycol 3350 POWD Take 17 g by mouth at bedtime. Mix 17 grams in 4-8 oz of liquid and drink     [provider]  pregabalin (LYRICA) 75 MG capsule Take 1 capsule (75 mg total) by mouth 2 (two) times daily. 11/17/19   Pahwani, Michell Heinrich, MD  rosuvastatin (CRESTOR) 20 MG tablet Take 1 tablet (20 mg total) by mouth daily. 11/17/19   Pahwani, Michell Heinrich, MD  simethicone (MYLICON) 80 MG chewable tablet Chew 1 tablet (80 mg total) by mouth 4 (four) times daily. Patient taking differently: Chew 80 mg by mouth as needed.  06/18/19   Loletha Grayer, MD  triamcinolone cream (KENALOG) 0.5 % Apply twice a day to back of thighs 06/18/19   Loletha Grayer, MD  VASCEPA 0.5 g CAPS Take 4 capsules by mouth in the morning and at bedtime. 11/17/19   Pahwani, Michell Heinrich, MD  zolpidem (AMBIEN) 10 MG tablet Take 1 tablet by mouth at bedtime. 05/10/19   [provider]  GLUCERNA (GLUCERNA) LIQD Take 237 mLs by mouth 3 (three) times daily between meals. 02/19/15 02/27/15  Bettey Costa, MD    Allergies Penicillins, Reglan [metoclopramide], Fentanyl, and Percocet [oxycodone-acetaminophen]  Family History  Problem Relation Age of Onset  . Diabetes Mother     Social History Social History   Tobacco Use  . Smoking status: Former Research scientist (life sciences)  . Smokeless tobacco: Never Used  . Tobacco comment: quit 7 years ago   Vaping Use  . Vaping Use: Never used  Substance Use Topics  . Alcohol use: Not Currently    Comment: quit drinking 2 years ago   . Drug use: No    Review of Systems  Constitutional: No fever/chills Eyes: No visual changes. ENT: No sore throat. Cardiovascular:  Denies chest pain. Respiratory: Denies shortness of breath. Gastrointestinal: Positive for abdominal pain, nausea, vomiting, and diarrhea.  No constipation. Genitourinary: Negative for dysuria. Musculoskeletal: Negative for back pain. Skin: Negative for rash. Neurological: Negative for headaches, focal weakness or numbness.  ____________________________________________   PHYSICAL EXAM:  VITAL SIGNS: ED Triage Vitals  Enc Vitals Group     BP 04/23/20 0715 (!) 150/95     Pulse Rate 04/23/20 0715 87     Resp 04/23/20 0715 19     Temp 04/23/20 0715 97.8 F (36.6 C)     Temp src --      SpO2 04/23/20 0715 100 %     Weight 04/23/20 0713 128 lb (58.1 kg)     Height 04/23/20 0713 5\' 1"  (1.549 m)     Head Circumference --      Peak Flow --      Pain Score 04/23/20 0712 8     Pain Loc --      Pain Edu? --      Excl. in [place]? --     Constitutional: Alert and oriented. Eyes: Conjunctivae are normal. Head: Atraumatic. Nose: No congestion/rhinnorhea. Mouth/Throat: Mucous membranes are moist. Neck: Normal ROM Cardiovascular: Normal rate, regular rhythm. Grossly normal heart sounds. Respiratory: Normal respiratory effort.  No retractions. Lungs CTAB. Gastrointestinal: Soft and tender to palpation in the epigastrium with no rebound or guarding, mildly distended abdomen. Genitourinary: deferred Musculoskeletal: No lower extremity tenderness nor edema. Neurologic:  Normal speech and language. No gross focal neurologic deficits are appreciated. Skin:  Skin is warm, dry and intact. No rash noted. Psychiatric: Mood and affect are normal. Speech and behavior are normal.  ____________________________________________   LABS (all labs ordered are listed, but only abnormal results are displayed)  Labs Reviewed  LIPASE, BLOOD - Abnormal; Notable for the following components:      Result Value   Lipase 79 (*)    All other components within normal limits  COMPREHENSIVE METABOLIC PANEL -  Abnormal; Notable for the following components:   Glucose, Bld 320 (*)    BUN 49 (*)    Creatinine, Ser 1.91 (*)    Calcium 8.8 (*)    AST 50 (*)    ALT 49 (*)    Alkaline Phosphatase 294 (*)    GFR, Estimated 29 (*)    All other components within normal limits  CBC - Abnormal; Notable for the following components:   Hemoglobin 11.3 (*)    HCT 33.8 (*)  All other components within normal limits  URINALYSIS, COMPLETE (UACMP) WITH MICROSCOPIC - Abnormal; Notable for the following components:   Color, Urine YELLOW (*)    APPearance CLOUDY (*)    Glucose, UA >=500 (*)    Hgb urine dipstick SMALL (*)    Protein, ur 100 (*)    Leukocytes,Ua LARGE (*)    WBC, UA >50 (*)    Bacteria, UA RARE (*)    All other components within normal limits  URINE CULTURE    PROCEDURES  Procedure(s) performed (including Critical Care):  Procedures   ____________________________________________   INITIAL IMPRESSION / ASSESSMENT AND PLAN / ED COURSE       65 year old female with past medical history of hypertension, hyperlipidemia, diabetes, chronic pancreatitis, asthma, CKD, diastolic CHF presents to the ED with recurrent epigastric abdominal pain associated with vomiting and diarrhea.  Patient does have some tenderness in her epigastrium and symptoms seem consistent with her chronic recurrent pancreatitis.  We will screen labs including LFTs and lipase, hydrate with IV fluids and treat symptomatically with IV morphine and Zofran.  We will hold off on imaging for now given consistency with her recurrent symptoms in the past.  LFTs show chronic elevation of alkaline phosphatase but no elevation in bilirubin, doubt biliary obstructive process.  Renal function is similar to previous, she is hyperglycemic with no evidence of DKA.  Patient reports feeling better following IV fluids, pain and nausea medicine.  Lipase is mildly elevated, which is a chronic finding for patient, CT was performed and shows  no peripancreatic inflammation or other acute process.  UA does appear consistent with infection and patient was treated with Rocephin.  Patient is appropriate for discharge home with GI follow-up along with Keflex for UTI.  She was counseled to follow-up with her PCP for pain medication and to return to the ED for any new or worsening symptoms, patient agrees with plan.      ____________________________________________   FINAL CLINICAL IMPRESSION(S) / ED DIAGNOSES  Final diagnoses:  Chronic pancreatitis, unspecified pancreatitis type ([place])  Acute cystitis without hematuria     ED Discharge Orders         Ordered    cephALEXin (KEFLEX) 500 MG capsule  2 times daily        04/23/20 1201    ondansetron (ZOFRAN ODT) 4 MG disintegrating tablet  Every 8 hours PRN        04/23/20 1201           Note:  This document was prepared using Dragon voice recognition software and may include unintentional dictation errors.   Blake Divine, MD 04/23/20 [phone]

## 2020-04-26 LAB — URINE CULTURE: Culture: >=100000 — AB

## 2020-05-15 ENCOUNTER — Emergency Department: Payer: Medicaid Other

## 2020-05-15 ENCOUNTER — Emergency Department
Admission: EM | Admit: 2020-05-15 | Discharge: 2020-05-15 | Disposition: A | Discharge status: Home / Self Care | Payer: Medicaid Other | Attending: Emergency Medicine | Admitting: Emergency Medicine

## 2020-05-15 ENCOUNTER — Other Ambulatory Visit: Payer: Self-pay

## 2020-05-15 DIAGNOSIS — Z79899 Other long term (current) drug therapy: Secondary | ICD-10-CM | POA: Insufficient documentation

## 2020-05-15 DIAGNOSIS — R1084 Generalized abdominal pain: Secondary | ICD-10-CM

## 2020-05-15 DIAGNOSIS — Z794 Long term (current) use of insulin: Secondary | ICD-10-CM | POA: Diagnosis not present

## 2020-05-15 DIAGNOSIS — Z87891 Personal history of nicotine dependence: Secondary | ICD-10-CM | POA: Diagnosis not present

## 2020-05-15 DIAGNOSIS — N3090 Cystitis, unspecified without hematuria: Secondary | ICD-10-CM | POA: Insufficient documentation

## 2020-05-15 DIAGNOSIS — I129 Hypertensive chronic kidney disease with stage 1 through stage 4 chronic kidney disease, or unspecified chronic kidney disease: Secondary | ICD-10-CM | POA: Insufficient documentation

## 2020-05-15 DIAGNOSIS — K860 Alcohol-induced chronic pancreatitis: Secondary | ICD-10-CM | POA: Diagnosis not present

## 2020-05-15 DIAGNOSIS — E1122 Type 2 diabetes mellitus with diabetic chronic kidney disease: Secondary | ICD-10-CM | POA: Diagnosis not present

## 2020-05-15 DIAGNOSIS — N1831 Chronic kidney disease, stage 3a: Secondary | ICD-10-CM | POA: Insufficient documentation

## 2020-05-15 DIAGNOSIS — N3000 Acute cystitis without hematuria: Secondary | ICD-10-CM

## 2020-05-15 LAB — CBC WITH DIFFERENTIAL/PLATELET
Abs Immature Granulocytes: 0.1 10*3/uL — ABNORMAL HIGH (ref 0.00–0.07)
Basophils Absolute: 0.1 10*3/uL (ref 0.0–0.1)
Basophils Relative: 1 %
Eosinophils Absolute: 0 10*3/uL (ref 0.0–0.5)
Eosinophils Relative: 0 %
HCT: 35.8 % — ABNORMAL LOW (ref 36.0–46.0)
Hemoglobin: 12.3 g/dL (ref 12.0–15.0)
Immature Granulocytes: 1 %
Lymphocytes Relative: 27 %
Lymphs Abs: 2.4 10*3/uL (ref 0.7–4.0)
MCH: 28.9 pg (ref 26.0–34.0)
MCHC: 34.4 g/dL (ref 30.0–36.0)
MCV: 84.2 fL (ref 80.0–100.0)
Monocytes Absolute: 0.6 10*3/uL (ref 0.1–1.0)
Monocytes Relative: 7 %
Neutro Abs: 5.8 10*3/uL (ref 1.7–7.7)
Neutrophils Relative %: 64 %
Platelets: 255 10*3/uL (ref 150–400)
RBC: 4.25 MIL/uL (ref 3.87–5.11)
RDW: 11.5 % (ref 11.5–15.5)
WBC: 9 10*3/uL (ref 4.0–10.5)
nRBC: 0 % (ref 0.0–0.2)

## 2020-05-15 LAB — URINALYSIS, COMPLETE (UACMP) WITH MICROSCOPIC
Bacteria, UA: NONE SEEN
Bilirubin Urine: NEGATIVE
Glucose, UA: >=500 mg/dL — AB
Ketones, ur: NEGATIVE mg/dL
Nitrite: NEGATIVE
Protein, ur: 100 mg/dL — AB
Specific Gravity, Urine: 1.002 — ABNORMAL LOW (ref 1.005–1.030)
pH: 6 (ref 5.0–8.0)

## 2020-05-15 LAB — AMMONIA: Ammonia: 28 umol/L (ref 9–35)

## 2020-05-15 LAB — LIPASE, BLOOD: Lipase: 122 U/L — ABNORMAL HIGH (ref 11–51)

## 2020-05-15 LAB — COMPREHENSIVE METABOLIC PANEL
ALT: 31 U/L (ref 0–44)
AST: 28 U/L (ref 15–41)
Albumin: 4 g/dL (ref 3.5–5.0)
Alkaline Phosphatase: 224 U/L — ABNORMAL HIGH (ref 38–126)
Anion gap: 12 (ref 5–15)
BUN: 25 mg/dL — ABNORMAL HIGH (ref 8–23)
CO2: 20 mmol/L — ABNORMAL LOW (ref 22–32)
Calcium: 9.1 mg/dL (ref 8.9–10.3)
Chloride: 101 mmol/L (ref 98–111)
Creatinine, Ser: 1.5 mg/dL — ABNORMAL HIGH (ref 0.44–1.00)
GFR, Estimated: 39 mL/min — ABNORMAL LOW (ref >60)
Glucose, Bld: 291 mg/dL — ABNORMAL HIGH (ref 70–99)
Potassium: 4 mmol/L (ref 3.5–5.1)
Sodium: 133 mmol/L — ABNORMAL LOW (ref 135–145)
Total Bilirubin: 0.8 mg/dL (ref 0.3–1.2)
Total Protein: 8.1 g/dL (ref 6.5–8.1)

## 2020-05-15 LAB — MAGNESIUM: Magnesium: 2 mg/dL (ref 1.7–2.4)

## 2020-05-15 MED ORDER — NITROFURANTOIN MONOHYD MACRO 100 MG PO CAPS
100.0000 mg | ORAL_CAPSULE | Freq: Once | ORAL | Status: AC
  Administered 2020-05-15: 100 mg via ORAL
  Filled 2020-05-15: qty 1

## 2020-05-15 MED ORDER — LACTATED RINGERS IV BOLUS
1000.0000 mL | Freq: Once | INTRAVENOUS | Status: AC
  Administered 2020-05-15: 1000 mL via INTRAVENOUS

## 2020-05-15 MED ORDER — NITROFURANTOIN MONOHYD MACRO 100 MG PO CAPS: 100.0000 mg | ORAL_CAPSULE | Freq: Two times a day (BID) | ORAL | 0 refills | Status: DC

## 2020-05-15 MED ORDER — ONDANSETRON 4 MG PO TBDP: 4.0000 mg | ORAL_TABLET | Freq: Three times a day (TID) | ORAL | 0 refills | Status: DC | PRN

## 2020-05-15 MED ORDER — MORPHINE SULFATE (PF) 4 MG/ML IV SOLN
4.0000 mg | Freq: Once | INTRAVENOUS | Status: AC
  Administered 2020-05-15: 4 mg via INTRAVENOUS
  Filled 2020-05-15: qty 1

## 2020-05-15 MED ORDER — IOHEXOL 9 MG/ML PO SOLN
500.0000 mL | ORAL | Status: AC
  Administered 2020-05-15 (×2): 500 mL via ORAL

## 2020-05-15 MED ORDER — ONDANSETRON HCL 4 MG/2ML IJ SOLN
4.0000 mg | Freq: Once | INTRAMUSCULAR | Status: AC
  Administered 2020-05-15: 4 mg via INTRAVENOUS
  Filled 2020-05-15: qty 2

## 2020-05-15 NOTE — ED Notes (Signed)
CT made aware that patient has finished most contrast and states cannot tolerate more due to bloating.

## 2020-05-15 NOTE — ED Triage Notes (Signed)
Pt BIB EMS from home for RUQ abdominal pain, nausea, and decreased appetite. Hypertensive with EMS at 187/83, hx of HTN and has not taken medications. Pt given 4mg  zofran IV with EMS with improvement. Hx of pancreatitis, blood glucose 292 with EMS.

## 2020-05-15 NOTE — ED Provider Notes (Signed)
[facility] Emergency Department Provider Note ____________________________________________   Event Date/Time   First MD Initiated Contact with Patient 05/15/20 1040     (approximate)  I have reviewed the triage vital signs and the nursing notes.  HISTORY  Chief Complaint Abdominal Pain and Nausea   HPI Karen [name] is a 65 y.o. femalewho presents to the ED for evaluation of abdominal pain and nausea.   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  DM on insulin, HTN and hypothyroidism.  Patient presents to the ED for evaluation of acute on chronic abdominal pain, nausea and vomiting.  She reports difficulty keep anything down for the past 2-3 days.  She reports having dysuria alongside this without hematuria, flank pain, fever, chest pain, cough or syncopal episodes.  Currently reporting 9/10 intensity generalized abdominal pain, fairly to her epigastrium and RUQ.  Reports improved nausea after EMS Zofran.   Past Medical History:  Diagnosis Date  . Alcoholic pancreatitis   . Asthma   . Depression   . Diabetes mellitus without complication ([place])   . DKA (diabetic ketoacidoses) 02/17/2015  . Emphysematous cystitis   . Hypercholesteremia   . Hypertension   . Hypokalemia     Patient Active Problem List   Diagnosis Date Noted  . Stroke-like symptom 11/15/2019  . Itching   . Diabetic polyneuropathy associated with type 2 diabetes mellitus ([place])   . Leukocytosis 06/13/2019  . Chronic kidney disease (CKD), stage II (mild)   . CKD (chronic kidney disease), stage IIIa 03/15/2019  . HTN (hypertension) 03/15/2019  . Type II diabetes mellitus with renal manifestations ([place]) 03/15/2019  . Headache 02/26/2019  . Dizziness 02/26/2019  . Hyperglycemia due to type 2 diabetes mellitus ([place]) 01/30/2019  . Acute pancreatitis 01/30/2019  . Constipation   . Acute on chronic  pancreatitis ([place]) 01/25/2019  . Acute pain of left shoulder   . Adhesive capsulitis of left shoulder   . Abnormal LFTs 01/24/2019  . Cough 01/24/2019  . Hypertension   . HLD (hyperlipidemia)   . Type 2 diabetes mellitus with hyperlipidemia ([place])   . GERD (gastroesophageal reflux disease)   . Acute kidney injury superimposed on CKD ([place])   . Pancreatitis, recurrent   . Chronic pancreatitis ([place]) 09/25/2018  . 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 Coffee ground emesis   . Upper GI bleed 11/11/2017  . Pancreatitis, acute 01/03/2017  . ETOH abuse 01/03/2017  . UTI (urinary tract infection) 01/03/2017  . Nausea and vomiting   . Esophageal candidiasis ([place])   . Intractable vomiting with nausea   . Hypertensive urgency 04/21/2016  . Abdominal pain   . Nausea vomiting and diarrhea   . Smoker   . Poorly controlled type 2 diabetes mellitus ([place])   . Acute renal insufficiency 10/29/2015  . Elevated troponin 10/29/2015  . Urinary tract infection without hematuria 10/29/2015  . Left flank pain 10/29/2015  . Malignant essential hypertension 10/29/2015  . DKA (diabetic ketoacidoses) 02/17/2015  . Emphysematous cystitis     Past Surgical History:  Procedure Laterality Date  . ABDOMINAL HYSTERECTOMY  1996  . APPENDECTOMY  1997  . ESOPHAGOGASTRODUODENOSCOPY N/A 11/12/2017   Procedure: ESOPHAGOGASTRODUODENOSCOPY (EGD);  Surgeon: Lin Landsman, MD;  Location: [facility];  Service: Gastroenterology;  Laterality: N/A;  . ESOPHAGOGASTRODUODENOSCOPY N/A 09/29/2018   Procedure: ESOPHAGOGASTRODUODENOSCOPY (EGD);  Surgeon: Toledo, Benay Pike, MD;  Location: [facility];  Service: Gastroenterology;  Laterality: N/A;  . ESOPHAGOGASTRODUODENOSCOPY (EGD) WITH PROPOFOL N/A 04/24/2016   Procedure: ESOPHAGOGASTRODUODENOSCOPY (EGD) WITH PROPOFOL;  Surgeon: [name], MD;  Location: [facility];  Service: Endoscopy;  Laterality: N/A;  . ESOPHAGOGASTRODUODENOSCOPY (EGD) WITH PROPOFOL N/A  01/28/2019   Procedure: ESOPHAGOGASTRODUODENOSCOPY (EGD) WITH PROPOFOL;  Surgeon: Toledo, Benay Pike, MD;  Location: [facility];  Service: Gastroenterology;  Laterality: N/A;  . EYE SURGERY    . HAND SURGERY  1988  . THYROID SURGERY  2013    Prior to Admission medications   Medication Sig Start Date End Date Taking? Authorizing Provider  nitrofurantoin, macrocrystal-monohydrate, (MACROBID) 100 MG capsule Take 1 capsule (100 mg total) by mouth 2 (two) times daily for 5 days. 05/15/20 05/20/20 Yes Vladimir Crofts, MD  ondansetron (ZOFRAN ODT) 4 MG disintegrating tablet Take 1 tablet (4 mg total) by mouth every 8 (eight) hours as needed for nausea or vomiting. 05/15/20  Yes Vladimir Crofts, MD  amLODipine (NORVASC) 10 MG tablet Take 1 tablet (10 mg total) by mouth daily. 11/17/19   Pahwani, Michell Heinrich, MD  ARIPiprazole (ABILIFY) 5 MG tablet Take 1 tablet (5 mg total) by mouth daily. 11/17/19   Pahwani, Michell Heinrich, MD  cetirizine (ZYRTEC) 5 MG tablet Take 1 tablet (5 mg total) by mouth daily. 11/17/19 12/17/19  Pahwani, Michell Heinrich, MD  cyclobenzaprine (FLEXERIL) 5 MG tablet Take 5 mg by mouth 2 (two) times daily as needed for muscle spasms.     [provider]  docusate sodium (COLACE) 100 MG capsule Take 1 capsule (100 mg total) by mouth 2 (two) times daily. 11/17/19   Pahwani, Michell Heinrich, MD  famotidine (PEPCID) 20 MG tablet Take 1 tablet (20 mg total) by mouth 2 (two) times daily. 11/17/19   Pahwani, Michell Heinrich, MD  HYDROcodone-acetaminophen (NORCO/VICODIN) 5-325 MG tablet One tablet every 6 hours as needed for severe pain. 11/17/19   Pahwani, Michell Heinrich, MD  insulin aspart (NOVOLOG) 100 UNIT/ML FlexPen Inject 2 Units into the skin 3 (three) times daily with meals. 11/17/19   Pahwani, Michell Heinrich, MD  insulin glargine (LANTUS) 100 unit/mL SOPN Inject 20 Units into the skin at bedtime. 11/17/19   Pahwani, Michell Heinrich, MD  Insulin Pen Needle 29G X 12MM MISC 1 Dose by Does not apply route 3 (three) times daily before meals.  06/18/19   Loletha Grayer, MD  lipase/protease/amylase (CREON) 36000 UNITS CPEP capsule Take 2 capsules (72,000 Units total) by mouth 3 (three) times daily with meals. 11/17/19   Pahwani, Michell Heinrich, MD  omeprazole (PRILOSEC) 40 MG capsule Take 1 capsule (40 mg total) by mouth in the morning and at bedtime. 11/17/19   Pahwani, Michell Heinrich, MD  ondansetron (ZOFRAN ODT) 4 MG disintegrating tablet Take 1 tablet (4 mg total) by mouth every 8 (eight) hours as needed for nausea or vomiting. 04/23/20   Blake Divine, MD  Polyethylene Glycol 3350 POWD Take 17 g by mouth at bedtime. Mix 17 grams in 4-8 oz of liquid and drink     [provider]  pregabalin (LYRICA) 75 MG capsule Take 1 capsule (75 mg total) by mouth 2 (two) times daily. 11/17/19   Pahwani, Michell Heinrich, MD  rosuvastatin (CRESTOR) 20 MG tablet Take 1 tablet (20 mg total) by mouth daily. 11/17/19   Pahwani, Michell Heinrich, MD  simethicone (MYLICON) 80 MG chewable tablet Chew 1 tablet (80 mg total) by mouth 4 (four) times daily. Patient taking differently: Chew 80 mg by mouth as needed.  06/18/19   Elaine,  Richard, MD  triamcinolone cream (KENALOG) 0.5 % Apply twice a day to back of thighs 06/18/19   Loletha Grayer, MD  VASCEPA 0.5 g CAPS Take 4 capsules by mouth in the morning and at bedtime. 11/17/19   Pahwani, Michell Heinrich, MD  zolpidem (AMBIEN) 10 MG tablet Take 1 tablet by mouth at bedtime. 05/10/19   [provider]  GLUCERNA (GLUCERNA) LIQD Take 237 mLs by mouth 3 (three) times daily between meals. 02/19/15 02/27/15  Bettey Costa, MD    Allergies Penicillins, Reglan [metoclopramide], Fentanyl, and Percocet [oxycodone-acetaminophen]  Family History  Problem Relation Age of Onset  . Diabetes Mother     Social History Social History   Tobacco Use  . Smoking status: Former Research scientist (life sciences)  . Smokeless tobacco: Never Used  . Tobacco comment: quit 7 years ago   Vaping Use  . Vaping Use: Never used  Substance Use Topics  . Alcohol use: Not  Currently    Comment: quit drinking 2 years ago   . Drug use: No    Review of Systems  Constitutional: No fever/chills Eyes: No visual changes. ENT: No sore throat. Cardiovascular: Denies chest pain. Respiratory: Denies shortness of breath. Gastrointestinal: Positive for abdominal pain, nausea and vomiting.  No diarrhea.  No constipation. Genitourinary: Positive for dysuria. Musculoskeletal: Negative for back pain. Skin: Negative for rash. Neurological: Negative for headaches, focal weakness or numbness.  ____________________________________________   PHYSICAL EXAM:  VITAL SIGNS: Vitals:   05/15/20 1445 05/15/20 1500  BP:    Pulse: 81 84  Resp: 19 (!) 21  Temp:    SpO2: 100% 95%      Constitutional: Alert and oriented. Well appearing and in no acute distress. Eyes: Conjunctivae are normal. PERRL. EOMI. Head: Atraumatic. Nose: No congestion/rhinnorhea. Mouth/Throat: Mucous membranes are moist.  Oropharynx non-erythematous. Neck: No stridor. No cervical spine tenderness to palpation. Cardiovascular: Normal rate, regular rhythm. Grossly normal heart sounds.  Good peripheral circulation. Respiratory: Normal respiratory effort.  No retractions. Lungs CTAB. Gastrointestinal: Soft ,. No CVA tenderness. Mildly distended.  Diffusely tender to palpation initially without peritoneal features. Upon reassessment, is mildly tender to her periumbilical and suprapubic abdomen, otherwise benign abdomen. Musculoskeletal: No lower extremity tenderness nor edema.  No joint effusions. No signs of acute trauma. Neurologic:  Normal speech and language. No gross focal neurologic deficits are appreciated. No gait instability noted. Skin:  Skin is warm, dry and intact. No rash noted. Psychiatric: Mood and affect are normal. Speech and behavior are normal.  ____________________________________________   LABS (all labs ordered are listed, but only abnormal results are displayed)  Labs  Reviewed  COMPREHENSIVE METABOLIC PANEL - Abnormal; Notable for the following components:      Result Value   Sodium 133 (*)    CO2 20 (*)    Glucose, Bld 291 (*)    BUN 25 (*)    Creatinine, Ser 1.50 (*)    Alkaline Phosphatase 224 (*)    GFR, Estimated 39 (*)    All other components within normal limits  CBC WITH DIFFERENTIAL/PLATELET - Abnormal; Notable for the following components:   HCT 35.8 (*)    Abs Immature Granulocytes 0.10 (*)    All other components within normal limits  URINALYSIS, COMPLETE (UACMP) WITH MICROSCOPIC - Abnormal; Notable for the following components:   Color, Urine STRAW (*)    APPearance HAZY (*)    Specific Gravity, Urine 1.002 (*)    Glucose, UA >=500 (*)    Hgb urine dipstick SMALL (*)  Protein, ur 100 (*)    Leukocytes,Ua LARGE (*)    All other components within normal limits  LIPASE, BLOOD - Abnormal; Notable for the following components:   Lipase 122 (*)    All other components within normal limits  URINE CULTURE  MAGNESIUM  AMMONIA   ____________________________________________  12 Lead EKG  Sinus rhythm, rate of 85 bpm.  Normal axis and intervals.  No evidence of acute ischemia. ____________________________________________  RADIOLOGY  ED MD interpretation: CT abdomen/pelvis reviewed by me with some bladder wall thickening without evidence of ureteral obstruction or pancreatic pseudocyst  Official radiology report(s): CT ABDOMEN PELVIS WO CONTRAST  Result Date: 05/15/2020 CLINICAL DATA:  Acute nonlocalized abdominal pain. History of cirrhosis and chronic pancreatitis. Right upper quadrant pain. EXAM: CT ABDOMEN AND PELVIS WITHOUT CONTRAST TECHNIQUE: Multidetector CT imaging of the abdomen and pelvis was performed following the standard protocol without IV contrast. COMPARISON:  04/23/2020 FINDINGS: Lower chest: Scarring at the lung bases. Mild atelectasis in the left lower lobe not seen previously. Hepatobiliary: Cirrhosis not  detectable by CT. No calcified gallstones. No ductal dilatation. Pancreas: Normal.  No evidence of acute or chronic pancreatitis. Spleen: Normal Adrenals/Urinary Tract: Adrenal glands are normal. 1 mm nonobstructing stone in the right kidney as seen previously. The right ureter is chronically dilated all the way to the bladder. No obstructing lesion is seen. Left kidney is normal. Mild chronic wall thickening of the bladder. Stomach/Bowel: No acute or significant bowel finding. No evidence of obstruction or inflammatory disease. Previous appendectomy. Vascular/Lymphatic: Aortic atherosclerosis. No aneurysm. IVC is normal. No retroperitoneal adenopathy. Reproductive: Previous hysterectomy.  No pelvic mass. Other: No free fluid or air. Musculoskeletal: Ordinary mild lumbar degenerative changes. Insignificant lipoma within the right the psoas muscle at the level of the hip. IMPRESSION: Minimal atelectasis at the left base not seen previously. No cause of acute right upper quadrant pain is identified. No sign hepatobiliary pathology on this noncontrast CT. 1 mm nonobstructing stone medial right kidney. Fullness of the right ureter but no obstructing abnormality seen. Bladder is full and shows wall thickening, which could indicate chronic cystitis. Aortic Atherosclerosis (ICD10-I70.0). Electronically Signed   By: Nelson Chimes M.D.   On: 05/15/2020 14:52    ____________________________________________   PROCEDURES and INTERVENTIONS  Procedure(s) performed (including Critical Care):  .1-3 Lead EKG Interpretation Performed by: Vladimir Crofts, MD Authorized by: Vladimir Crofts, MD     Interpretation: normal     ECG rate:  80   ECG rate assessment: normal     Rhythm: sinus rhythm     Ectopy: none     Conduction: normal      Medications  nitrofurantoin (macrocrystal-monohydrate) (MACROBID) capsule 100 mg (has no administration in time range)  lactated ringers bolus 1,000 mL (1,000 mLs Intravenous New  Bag/Given 05/15/20 1104)  morphine 4 MG/ML injection 4 mg (4 mg Intravenous Given 05/15/20 1059)  ondansetron (ZOFRAN) injection 4 mg (4 mg Intravenous Given 05/15/20 1102)  lactated ringers bolus 1,000 mL (1,000 mLs Intravenous New Bag/Given 05/15/20 1306)  morphine 4 MG/ML injection 4 mg (4 mg Intravenous Given 05/15/20 1303)  iohexol (OMNIPAQUE) 9 MG/ML oral solution 500 mL (500 mLs Oral Contrast Given 05/15/20 1352)    ____________________________________________   MDM / ED COURSE   65 year old woman with a history of chronic pancreatitis presented to ED with acute on chronic symptoms, with some evidence of acute cystitis, amenable to outpatient management.  She has some periumbilical and suprapubic tenderness without peritoneal features.  Otherwise  benign exam.  Blood work with chronic elevation of her lipase without remarkable worsening of this.  Urine with large leukocytes and in conjunction with her symptoms of dysuria, concerned about acute cystitis.  No evidence of pyelonephritis.  Her urine was sent for culture and she was started on a course of Macrobid.  CT imaging of abdomen/pelvis reveals no evidence of ureteral obstruction, peripancreatic pseudocyst or other intra-abdominal pathology.  She is improving symptoms with fluid resuscitation and analgesia.  We will trial p.o. challenge, start Macrobid and plan for outpatient management.  Patient signed out to oncoming provider to follow-up on her p.o. challenge and anticipate she be suitable for outpatient management.  Return precautions were discussed for the ED.   Clinical Course as of 05/15/20 1545  Wed May 15, 2020  1540 Reassessed.  Patient reports that she is feeling better.  We discussed benign work-up and we discussed her urinalysis results.  She does report to me now that she has had some dysuria for the past 2-3 days.  We discussed antibiotics for this and possible outpatient management.  She is agreeable. [DS]    Clinical Course  User Index [DS] Vladimir Crofts, MD    ____________________________________________   FINAL CLINICAL IMPRESSION(S) / ED DIAGNOSES  Final diagnoses:  Generalized abdominal pain  Acute cystitis without hematuria  Alcohol-induced chronic pancreatitis [facility])     ED Discharge Orders         Ordered    nitrofurantoin, macrocrystal-monohydrate, (MACROBID) 100 MG capsule  2 times daily        05/15/20 1542    ondansetron (ZOFRAN ODT) 4 MG disintegrating tablet  Every 8 hours PRN        05/15/20 1543           Flora Ratz   Note:  This document was prepared using Dragon voice recognition software and may include unintentional dictation errors.   Vladimir Crofts, MD 05/15/20 [phone]

## 2020-05-15 NOTE — ED Notes (Signed)
Pt and EMS stating that pt wears 2L nasal cannula oxygen at home, EMS took patient off chronic O2, this RN replaced on patient request.

## 2020-05-15 NOTE — Discharge Instructions (Addendum)
As we discussed, your urine testing had signs of infection like a UTI.  You are being discharged a prescription for antibiotics to take twice daily for the next 5 days.  Also being discharged with Zofran nausea medicine to use as needed for any further nausea that you may feel.  Continue all of your other medications.  Return to the ED with any further worsening symptoms despite these medications.

## 2020-05-17 ENCOUNTER — Emergency Department: Payer: Medicaid Other

## 2020-05-17 ENCOUNTER — Inpatient Hospital Stay
Admission: EM | Admit: 2020-05-17 | Discharge: 2020-05-24 | DRG: 689 | Disposition: A | Discharge status: Home / Self Care | Payer: Medicaid Other | Attending: Internal Medicine | Admitting: Internal Medicine

## 2020-05-17 ENCOUNTER — Other Ambulatory Visit: Payer: Self-pay

## 2020-05-17 DIAGNOSIS — N1832 Chronic kidney disease, stage 3b: Secondary | ICD-10-CM | POA: Diagnosis not present

## 2020-05-17 DIAGNOSIS — E785 Hyperlipidemia, unspecified: Secondary | ICD-10-CM | POA: Diagnosis present

## 2020-05-17 DIAGNOSIS — N1831 Chronic kidney disease, stage 3a: Secondary | ICD-10-CM

## 2020-05-17 DIAGNOSIS — I10 Essential (primary) hypertension: Secondary | ICD-10-CM | POA: Diagnosis present

## 2020-05-17 DIAGNOSIS — R1013 Epigastric pain: Secondary | ICD-10-CM | POA: Diagnosis present

## 2020-05-17 DIAGNOSIS — R112 Nausea with vomiting, unspecified: Secondary | ICD-10-CM | POA: Diagnosis not present

## 2020-05-17 DIAGNOSIS — N3000 Acute cystitis without hematuria: Secondary | ICD-10-CM | POA: Diagnosis not present

## 2020-05-17 DIAGNOSIS — Z87891 Personal history of nicotine dependence: Secondary | ICD-10-CM

## 2020-05-17 DIAGNOSIS — Z833 Family history of diabetes mellitus: Secondary | ICD-10-CM

## 2020-05-17 DIAGNOSIS — E11649 Type 2 diabetes mellitus with hypoglycemia without coma: Secondary | ICD-10-CM | POA: Diagnosis not present

## 2020-05-17 DIAGNOSIS — Z79899 Other long term (current) drug therapy: Secondary | ICD-10-CM

## 2020-05-17 DIAGNOSIS — K219 Gastro-esophageal reflux disease without esophagitis: Secondary | ICD-10-CM | POA: Diagnosis present

## 2020-05-17 DIAGNOSIS — Z87440 Personal history of urinary (tract) infections: Secondary | ICD-10-CM

## 2020-05-17 DIAGNOSIS — E1122 Type 2 diabetes mellitus with diabetic chronic kidney disease: Secondary | ICD-10-CM | POA: Diagnosis present

## 2020-05-17 DIAGNOSIS — R7989 Other specified abnormal findings of blood chemistry: Secondary | ICD-10-CM | POA: Diagnosis not present

## 2020-05-17 DIAGNOSIS — Z90710 Acquired absence of both cervix and uterus: Secondary | ICD-10-CM

## 2020-05-17 DIAGNOSIS — R1012 Left upper quadrant pain: Secondary | ICD-10-CM | POA: Diagnosis present

## 2020-05-17 DIAGNOSIS — E7800 Pure hypercholesterolemia, unspecified: Secondary | ICD-10-CM | POA: Diagnosis present

## 2020-05-17 DIAGNOSIS — I129 Hypertensive chronic kidney disease with stage 1 through stage 4 chronic kidney disease, or unspecified chronic kidney disease: Secondary | ICD-10-CM | POA: Diagnosis present

## 2020-05-17 DIAGNOSIS — Z794 Long term (current) use of insulin: Secondary | ICD-10-CM

## 2020-05-17 DIAGNOSIS — B957 Other staphylococcus as the cause of diseases classified elsewhere: Secondary | ICD-10-CM | POA: Diagnosis present

## 2020-05-17 DIAGNOSIS — R109 Unspecified abdominal pain: Secondary | ICD-10-CM

## 2020-05-17 DIAGNOSIS — K5900 Constipation, unspecified: Secondary | ICD-10-CM | POA: Diagnosis not present

## 2020-05-17 DIAGNOSIS — E1165 Type 2 diabetes mellitus with hyperglycemia: Secondary | ICD-10-CM | POA: Diagnosis not present

## 2020-05-17 DIAGNOSIS — Z20822 Contact with and (suspected) exposure to covid-19: Secondary | ICD-10-CM | POA: Diagnosis present

## 2020-05-17 DIAGNOSIS — F32A Depression, unspecified: Secondary | ICD-10-CM | POA: Diagnosis present

## 2020-05-17 DIAGNOSIS — I160 Hypertensive urgency: Secondary | ICD-10-CM | POA: Diagnosis not present

## 2020-05-17 DIAGNOSIS — Z880 Allergy status to penicillin: Secondary | ICD-10-CM

## 2020-05-17 DIAGNOSIS — K859 Acute pancreatitis without necrosis or infection, unspecified: Secondary | ICD-10-CM | POA: Diagnosis present

## 2020-05-17 DIAGNOSIS — N183 Chronic kidney disease, stage 3 unspecified: Secondary | ICD-10-CM | POA: Diagnosis present

## 2020-05-17 LAB — URINALYSIS, COMPLETE (UACMP) WITH MICROSCOPIC
Bacteria, UA: NONE SEEN
Bilirubin Urine: NEGATIVE
Glucose, UA: >=500 mg/dL — AB
Hgb urine dipstick: NEGATIVE
Ketones, ur: NEGATIVE mg/dL
Nitrite: NEGATIVE
Protein, ur: 100 mg/dL — AB
Specific Gravity, Urine: 1.001 — ABNORMAL LOW (ref 1.005–1.030)
Squamous Epithelial / HPF: NONE SEEN (ref 0–5)
pH: 6 (ref 5.0–8.0)

## 2020-05-17 LAB — RESP PANEL BY RT-PCR (FLU A&B, COVID) ARPGX2
Influenza A by PCR: NEGATIVE
Influenza B by PCR: NEGATIVE
SARS Coronavirus 2 by RT PCR: NEGATIVE

## 2020-05-17 LAB — CBC WITH DIFFERENTIAL/PLATELET
Abs Immature Granulocytes: 0.13 10*3/uL — ABNORMAL HIGH (ref 0.00–0.07)
Basophils Absolute: 0.1 10*3/uL (ref 0.0–0.1)
Basophils Relative: 1 %
Eosinophils Absolute: 0.1 10*3/uL (ref 0.0–0.5)
Eosinophils Relative: 1 %
HCT: 36.1 % (ref 36.0–46.0)
Hemoglobin: 13 g/dL (ref 12.0–15.0)
Immature Granulocytes: 1 %
Lymphocytes Relative: 19 %
Lymphs Abs: 2.2 10*3/uL (ref 0.7–4.0)
MCH: 29.5 pg (ref 26.0–34.0)
MCHC: 36 g/dL (ref 30.0–36.0)
MCV: 82 fL (ref 80.0–100.0)
Monocytes Absolute: 0.8 10*3/uL (ref 0.1–1.0)
Monocytes Relative: 7 %
Neutro Abs: 8 10*3/uL — ABNORMAL HIGH (ref 1.7–7.7)
Neutrophils Relative %: 71 %
Platelets: 270 10*3/uL (ref 150–400)
RBC: 4.4 MIL/uL (ref 3.87–5.11)
RDW: 11.2 % — ABNORMAL LOW (ref 11.5–15.5)
WBC: 11.3 10*3/uL — ABNORMAL HIGH (ref 4.0–10.5)
nRBC: 0 % (ref 0.0–0.2)

## 2020-05-17 LAB — COMPREHENSIVE METABOLIC PANEL
ALT: 29 U/L (ref 0–44)
AST: 35 U/L (ref 15–41)
Albumin: 4.2 g/dL (ref 3.5–5.0)
Alkaline Phosphatase: 196 U/L — ABNORMAL HIGH (ref 38–126)
Anion gap: 12 (ref 5–15)
BUN: 16 mg/dL (ref 8–23)
CO2: 23 mmol/L (ref 22–32)
Calcium: 8.9 mg/dL (ref 8.9–10.3)
Chloride: 95 mmol/L — ABNORMAL LOW (ref 98–111)
Creatinine, Ser: 1.52 mg/dL — ABNORMAL HIGH (ref 0.44–1.00)
GFR, Estimated: 38 mL/min — ABNORMAL LOW (ref >60)
Glucose, Bld: 362 mg/dL — ABNORMAL HIGH (ref 70–99)
Potassium: 3.8 mmol/L (ref 3.5–5.1)
Sodium: 130 mmol/L — ABNORMAL LOW (ref 135–145)
Total Bilirubin: 0.7 mg/dL (ref 0.3–1.2)
Total Protein: 8.1 g/dL (ref 6.5–8.1)

## 2020-05-17 LAB — CBG MONITORING, ED
Glucose-Capillary: 352 mg/dL — ABNORMAL HIGH (ref 70–99)
Glucose-Capillary: 271 mg/dL — ABNORMAL HIGH (ref 70–99)
Glucose-Capillary: 323 mg/dL — ABNORMAL HIGH (ref 70–99)

## 2020-05-17 LAB — LIPASE, BLOOD: Lipase: 165 U/L — ABNORMAL HIGH (ref 11–51)

## 2020-05-17 LAB — MAGNESIUM: Magnesium: 2 mg/dL (ref 1.7–2.4)

## 2020-05-17 MED ORDER — PANTOPRAZOLE SODIUM 40 MG IV SOLR
40.0000 mg | Freq: Once | INTRAVENOUS | Status: AC
  Administered 2020-05-17: 40 mg via INTRAVENOUS
  Filled 2020-05-17: qty 40

## 2020-05-17 MED ORDER — ONDANSETRON HCL 4 MG/2ML IJ SOLN
4.0000 mg | Freq: Once | INTRAMUSCULAR | Status: AC
  Administered 2020-05-17: 4 mg via INTRAVENOUS
  Filled 2020-05-17: qty 2

## 2020-05-17 MED ORDER — ALUM & MAG HYDROXIDE-SIMETH 200-200-20 MG/5ML PO SUSP
30.0000 mL | Freq: Once | ORAL | Status: AC
  Administered 2020-05-17: 30 mL via ORAL
  Filled 2020-05-17: qty 30

## 2020-05-17 MED ORDER — LORAZEPAM 2 MG/ML IJ SOLN
0.5000 mg | Freq: Four times a day (QID) | INTRAMUSCULAR | Status: DC | PRN
  Administered 2020-05-18: 0.5 mg via INTRAVENOUS
  Filled 2020-05-17: qty 1

## 2020-05-17 MED ORDER — SODIUM CHLORIDE 0.9 % IV SOLN
1.0000 g | INTRAVENOUS | Status: DC
  Administered 2020-05-18: 1 g via INTRAVENOUS
  Filled 2020-05-17 (×2): qty 10

## 2020-05-17 MED ORDER — NALOXONE HCL 2 MG/2ML IJ SOSY
0.4000 mg | PREFILLED_SYRINGE | INTRAMUSCULAR | Status: DC | PRN
  Filled 2020-05-17: qty 2

## 2020-05-17 MED ORDER — INSULIN GLARGINE 100 UNIT/ML ~~LOC~~ SOLN
7.0000 [IU] | Freq: Every day | SUBCUTANEOUS | Status: DC
  Administered 2020-05-18 – 2020-05-19 (×3): 7 [IU] via SUBCUTANEOUS
  Filled 2020-05-17 (×4): qty 0.07

## 2020-05-17 MED ORDER — HYDROMORPHONE HCL 1 MG/ML IJ SOLN
0.5000 mg | INTRAMUSCULAR | Status: DC | PRN
Start: 2020-05-17 — End: 2020-05-19
  Administered 2020-05-18 – 2020-05-19 (×11): 0.5 mg via INTRAVENOUS
  Filled 2020-05-17 (×11): qty 0.5

## 2020-05-17 MED ORDER — HALOPERIDOL LACTATE 5 MG/ML IJ SOLN
2.5000 mg | Freq: Once | INTRAMUSCULAR | Status: AC
  Administered 2020-05-17: 2.5 mg via INTRAVENOUS
  Filled 2020-05-17: qty 1

## 2020-05-17 MED ORDER — LACTATED RINGERS IV BOLUS
1000.0000 mL | Freq: Once | INTRAVENOUS | Status: AC
  Administered 2020-05-17: 1000 mL via INTRAVENOUS

## 2020-05-17 MED ORDER — FAMOTIDINE 20 MG PO TABS
40.0000 mg | ORAL_TABLET | Freq: Once | ORAL | Status: AC
  Administered 2020-05-17: 40 mg via ORAL
  Filled 2020-05-17: qty 2

## 2020-05-17 MED ORDER — SODIUM CHLORIDE 0.9 % IV SOLN
1.0000 g | Freq: Once | INTRAVENOUS | Status: AC
  Administered 2020-05-17: 1 g via INTRAVENOUS
  Filled 2020-05-17: qty 10

## 2020-05-17 MED ORDER — LACTATED RINGERS IV SOLN: INTRAVENOUS | Status: DC

## 2020-05-17 MED ORDER — HYDROMORPHONE HCL 1 MG/ML IJ SOLN
0.5000 mg | Freq: Once | INTRAMUSCULAR | Status: AC
  Administered 2020-05-17: 0.5 mg via INTRAVENOUS
  Filled 2020-05-17: qty 1

## 2020-05-17 MED ORDER — SODIUM CHLORIDE 0.9 % IV BOLUS
1000.0000 mL | Freq: Once | INTRAVENOUS | Status: AC
  Administered 2020-05-17: 1000 mL via INTRAVENOUS

## 2020-05-17 MED ORDER — INSULIN ASPART 100 UNIT/ML IJ SOLN
0.0000 [IU] | Freq: Four times a day (QID) | INTRAMUSCULAR | Status: DC
  Administered 2020-05-17: 7 [IU] via SUBCUTANEOUS
  Administered 2020-05-18: 3 [IU] via SUBCUTANEOUS
  Administered 2020-05-18 (×2): 2 [IU] via SUBCUTANEOUS
  Administered 2020-05-19: 1 [IU] via SUBCUTANEOUS
  Administered 2020-05-19 (×2): 2 [IU] via SUBCUTANEOUS
  Filled 2020-05-17 (×6): qty 1

## 2020-05-17 NOTE — ED Notes (Signed)
Dr. Joni Fears notified patient complaining of nausea/headache. Awaiting orders.

## 2020-05-17 NOTE — H&P (Signed)
History and Physical    PLEASE NOTE THAT [employer]AGON DICT[employer] SOFTWARE WAS USED IN THE CONSTRUCTION OF THIS NOTE.   [name] RCV:[id number] DOB: 04/08/1955 DOA: 05/17/2020  PCP: Lavera Guise, MD Patient coming from: home   I have personally briefly reviewed patient's old medical records in [place]  Chief Complaint: Nausea vomiting  HPI: Karen [name] is a 65 y.o. female with medical history significant for poorly controlled type 2 diabetes mellitus with most recent hemoglobin A1c 11.3% in March 2022, chronic abdominal pain, recurrent UTI, hypertension, hyperlipidemia, GERD, stage IIIb chronic kidney disease with baseline creatinine 1.4-1.5, chronically elevated alkaline phosphatase, who is admitted to [facility] on 05/17/2020 with intractable nausea/vomiting after presenting from home to [facility] ED complaining of ongoing nausea/vomiting.   The patient originally presented to [facility] ED on 05/15/2020 complaining of, at that time, 2 to 3 days of nausea/vomiting as well as dysuria.  Relative to her chronic abdominal discomfort, she reported that this sharp, nonradiating intermittent epigastric discomfort was of similar quality relative to baseline, but of slightly increased severity.  At that time, she had been experiencing intermittent nausea resulting in 2-3 episodes of nonbloody, nonbilious emesis per day.  She was diagnosed with acute cystitis following urinalysis that showed 21-50 white blood cells large leukocyte esterase, no squamous epithelial cells.  As further evaluation of her acute on chronic abdominal discomfort, she underwent CT abdomen/pelvis which showed a 1 mm nonobstructing stone in the medial right kidney, but otherwise no evidence of acute intra-abdominal process, including no evidence of acute or chronic pancreatitis, no evidence of gallbladder wall thickening, pericholecystic fluid, cholelithiasis, or common bile duct dilation.  Additionally, no evidence of  bowel obstruction, abscess, or perforation.  EKG at that time showed sinus rhythm without evidence of acute ischemic changes. The patient's nausea is improved with Zofran, she was able to pass her PO water challenge, and she was subsequently discharged home from the emergency department on a 5-day course of nitrofurantoin for acute cystitis as well as as needed Zofran ODT.  However, in the interval, the patient reports continued intermittent nausea resulting in 3-4 episodes of nonbloody, nonbilious emesis per day, with most recent such episode occurring in the emergency department today.  In the setting, she reports that she has been unable to tolerate any p.o. since discharge from the emergency department on 05/15/2020, including unable to tolerate any food, water, or medications.  She specifically mentions that she has thrown up within 20 minutes every time she has taken a dose of nitrofurantoin, and that the nausea has not improved as result of multiple doses of as needed Zofran ODT at home.  She reports no interval change in her epigastric abdominal discomfort that she was experiencing on 05/15/2020.  Specifically, she reports no change in distribution, intensity, or quality.  She reports to me that the location, quality, and intensity of her current abdominal discomfort is consistent with acute on chronic abdominal discomfort that she typically experiences at the time of her recurrent urinary tract infections.  She denies any diarrhea, melena, hematochezia, rash.  No recent trauma.  She is able to convey her chief concern about her inability to tolerate p.o. in the setting of recurrent nausea/vomiting, complicating her ability to take oral antibiotics at home in the setting of recent diagnosis of acute cystitis.  She continues to note dysuria, without any significant improvement in the interval following diagnosis of acute cystitis on 05/15/2020.  She denies any associated  gross hematuria or change in urinary  urgency/frequency.  Continues to produce flatus.  She confirms a history of recurrent urinary tract infections, and conveys that she has frequently experienced nausea/vomiting associated with these prior diagnoses.  She denies any recent flank discomfort.   She reports associated subjective fever in the absence of chills, full body rigors, or generalized myalgias.  She reports mild symmetrical frontal lobe headache in the absence of any associated neck stiffness, sore throat, shortness of breath, wheezing, cough.  Denies any known recent COVID-19 exposures.  Not associated with any recent chest pain, diaphoresis, palpitations, dizziness, presyncope, or syncope.  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  Denies any recreational drug use, including the use of THC.  Per chart review, urine culture collected in the emergency department on 05/15/2020 has grown greater than 100,000 colonies of staph lugdunensis.  Per chart review, this is pansensitive, including to penicillin.     ED Course:  Vital signs in the ED were notable for the following: Temperature max 98.5, heart rate 73-89; blood pressure 133/80-166/81, respiratory rate 16-24, oxygen saturation 95 to 100% on room air.  Labs were notable for the following: CMP was notable for the following: Sodium 130, which corrects to 134 when taking into account concomitant hyperglycemia, potassium 3.8, bicarbonate 23, anion gap 12, creatinine 1.52 relative to 1.5 on 05/15/2020, glucose 362, albumin 4.2, alkaline phosphatase 196 compared to 224 on 05/15/2020, AST 35, ALT 29, total bilirubin 0.7.  Lipase 165 compared to 122 on 05/15/2020.  Nasopharyngeal COVID-19/influenza PCR were checked in the ED this evening and found to be negative.  Of note, point-of-care glucose following administration of IV fluids, as further detailed below, trended down to  265.   Chest x-ray showed no evidence of acute cardiopulmonary process.  Plain films of the abdomen showed no evidence of bowel obstruction or free air.  While in the ED, the following were administered: Protonix 40 mg IV x1, Maalox p.o. x1 dose, Pepcid 40 mg p.o. x1, Dilaudid 0.5 mg IV x1, Haldol 2.5 mg IV x1, Zofran 4 mg IV x1, Rocephin 1 g IV x1, normal saline x1 L bolus, lactated Ringer's x1 L bolus.  In the setting of persistent nausea/vomiting in spite of the above measures, the patient was subsequently admitted to the med telemetry floor for further evaluation management of intractable nausea/vomiting.      Review of Systems: As per HPI otherwise 10 point review of systems negative.   Past Medical History:  Diagnosis Date  . Alcoholic pancreatitis   . Asthma   . Depression   . Diabetes mellitus without complication ([place])   . DKA (diabetic ketoacidoses) 02/17/2015  . Emphysematous cystitis   . Hypercholesteremia   . Hypertension   . Hypokalemia     Past Surgical History:  Procedure Laterality Date  . ABDOMINAL HYSTERECTOMY  1996  . APPENDECTOMY  1997  . ESOPHAGOGASTRODUODENOSCOPY N/A 11/12/2017   Procedure: ESOPHAGOGASTRODUODENOSCOPY (EGD);  Surgeon: Lin Landsman, MD;  Location: [facility] ENDOSCOPY;  Service: Gastroenterology;  Laterality: N/A;  . ESOPHAGOGASTRODUODENOSCOPY N/A 09/29/2018   Procedure: ESOPHAGOGASTRODUODENOSCOPY (EGD);  Surgeon: Toledo, Benay Pike, MD;  Location: [facility] ENDOSCOPY;  Service: Gastroenterology;  Laterality: N/A;  . ESOPHAGOGASTRODUODENOSCOPY (EGD) WITH PROPOFOL N/A 04/24/2016   Procedure: ESOPHAGOGASTRODUODENOSCOPY (EGD) WITH PROPOFOL;  Surgeon: [name], MD;  Location: [facility] ENDOSCOPY;  Service: Endoscopy;  Laterality: N/A;  . ESOPHAGOGASTRODUODENOSCOPY (EGD)  WITH PROPOFOL N/A 01/28/2019   Procedure: ESOPHAGOGASTRODUODENOSCOPY (EGD) WITH PROPOFOL;  Surgeon: Toledo, Benay Pike, MD;  Location: [facility] ENDOSCOPY;  Service: Gastroenterology;  Laterality:  N/A;  . EYE SURGERY    . HAND SURGERY  1988  . THYROID SURGERY  2013    Social History:  reports that she has quit smoking. She has never used smokeless tobacco. She reports previous alcohol use. She reports that she does not use drugs.   Allergies  Allergen Reactions  . Penicillins Anaphylaxis    Has patient had a PCN reaction causing immediate rash, facial/tongue/throat swelling, SOB or lightheadedness with hypotension: Yes Has patient had a PCN reaction causing severe rash involving mucus membranes or skin necrosis: No Has patient had a PCN reaction that required hospitalization No Has patient had a PCN reaction occurring within the last 10 years: No If all of the above answers are "NO", then may proceed with Cephalosporin use.  . Reglan [Metoclopramide] Other (See Comments)    Hypotension, shortness of breath  . Fentanyl     Rash   . Percocet [Oxycodone-Acetaminophen] Rash    plain tylenol can also make itch?    Family History  Problem Relation Age of Onset  . Diabetes Mother      Prior to Admission medications   Medication Sig Start Date End Date Taking? Authorizing Provider  amLODipine (NORVASC) 10 MG tablet Take 1 tablet (10 mg total) by mouth daily. 11/17/19   Pahwani, Michell Heinrich, MD  ARIPiprazole (ABILIFY) 5 MG tablet Take 1 tablet (5 mg total) by mouth daily. 11/17/19   Pahwani, Michell Heinrich, MD  cetirizine (ZYRTEC) 5 MG tablet Take 1 tablet (5 mg total) by mouth daily. 11/17/19 12/17/19  Pahwani, Michell Heinrich, MD  cyclobenzaprine (FLEXERIL) 5 MG tablet Take 5 mg by mouth 2 (two) times daily as needed for muscle spasms.     [provider]  docusate sodium (COLACE) 100 MG capsule Take 1 capsule (100 mg total) by mouth 2 (two) times daily. 11/17/19   Pahwani, Michell Heinrich, MD  famotidine (PEPCID) 20 MG tablet Take 1 tablet (20 mg total) by mouth 2 (two) times daily. 11/17/19   Pahwani, Michell Heinrich, MD  HYDROcodone-acetaminophen (NORCO/VICODIN) 5-325 MG tablet One tablet every 6  hours as needed for severe pain. 11/17/19   Pahwani, Michell Heinrich, MD  insulin aspart (NOVOLOG) 100 UNIT/ML FlexPen Inject 2 Units into the skin 3 (three) times daily with meals. 11/17/19   Pahwani, Michell Heinrich, MD  insulin glargine (LANTUS) 100 unit/mL SOPN Inject 20 Units into the skin at bedtime. 11/17/19   Pahwani, Michell Heinrich, MD  Insulin Pen Needle 29G X 12MM MISC 1 Dose by Does not apply route 3 (three) times daily before meals. 06/18/19   Loletha Grayer, MD  lipase/protease/amylase (CREON) 36000 UNITS CPEP capsule Take 2 capsules (72,000 Units total) by mouth 3 (three) times daily with meals. 11/17/19   Pahwani, Michell Heinrich, MD  nitrofurantoin, macrocrystal-monohydrate, (MACROBID) 100 MG capsule Take 1 capsule (100 mg total) by mouth 2 (two) times daily for 5 days. 05/15/20 05/20/20  Vladimir Crofts, MD  omeprazole (PRILOSEC) 40 MG capsule Take 1 capsule (40 mg total) by mouth in the morning and at bedtime. 11/17/19   Pahwani, Michell Heinrich, MD  ondansetron (ZOFRAN ODT) 4 MG disintegrating tablet Take 1 tablet (4 mg total) by mouth every 8 (eight) hours as needed for nausea or vomiting. 04/23/20   Blake Divine, MD  ondansetron (ZOFRAN ODT) 4 MG disintegrating tablet Take 1  tablet (4 mg total) by mouth every 8 (eight) hours as needed for nausea or vomiting. 05/15/20   Vladimir Crofts, MD  Polyethylene Glycol 3350 POWD Take 17 g by mouth at bedtime. Mix 17 grams in 4-8 oz of liquid and drink     [provider]  pregabalin (LYRICA) 75 MG capsule Take 1 capsule (75 mg total) by mouth 2 (two) times daily. 11/17/19   Pahwani, Michell Heinrich, MD  rosuvastatin (CRESTOR) 20 MG tablet Take 1 tablet (20 mg total) by mouth daily. 11/17/19   Pahwani, Michell Heinrich, MD  simethicone (MYLICON) 80 MG chewable tablet Chew 1 tablet (80 mg total) by mouth 4 (four) times daily. Patient taking differently: Chew 80 mg by mouth as needed.  06/18/19   Loletha Grayer, MD  triamcinolone cream (KENALOG) 0.5 % Apply twice a day to back of thighs 06/18/19    Loletha Grayer, MD  VASCEPA 0.5 g CAPS Take 4 capsules by mouth in the morning and at bedtime. 11/17/19   Pahwani, Michell Heinrich, MD  zolpidem (AMBIEN) 10 MG tablet Take 1 tablet by mouth at bedtime. 05/10/19   [provider]  GLUCERNA (GLUCERNA) LIQD Take 237 mLs by mouth 3 (three) times daily between meals. 02/19/15 02/27/15  Bettey Costa, MD     Objective    Physical Exam: Vitals:   05/17/20 1855 05/17/20 2030 05/17/20 2100 05/17/20 2230  BP: (!) 156/78 (!) 157/89 (!) 170/77 (!) 161/112  Pulse: 74 75 77 74  Resp: 20 18 17 18   Temp:      TempSrc:      SpO2: 98% 97% 95% 100%  Weight:      Height:        General: appears to be stated age; alert, oriented Skin: warm, dry, no rash Head:  AT/[place] Mouth:  Oral mucosa membranes appear dry, normal dentition Neck: supple; trachea midline Heart:  RRR; did not appreciate any M/R/G Lungs: CTAB, did not appreciate any wheezes, rales, or rhonchi Abdomen: + BS; soft, ND; mild tenderness over the epigastrium in the absence of any associated guarding, rigidity, or rebound tenderness Vascular: 2+ pedal pulses b/l; 2+ radial pulses b/l Extremities: no peripheral edema, no muscle wasting Neuro: strength and sensation intact in upper and lower extremities b/l    Labs on Admission: I have personally reviewed following labs and imaging studies  CBC: Recent Labs  Lab 05/15/20 1044 05/17/20 1541  WBC 9.0 11.3*  NEUTROABS 5.8 8.0*  HGB 12.3 13.0  HCT 35.8* 36.1  MCV 84.2 82.0  PLT 255 [id number]   Basic Metabolic Panel: Recent Labs  Lab 05/15/20 1044 05/17/20 1541  NA 133* 130*  K 4.0 3.8  CL 101 95*  CO2 20* 23  GLUCOSE 291* 362*  BUN 25* 16  CREATININE 1.50* 1.52*  CALCIUM 9.1 8.9  MG 2.0  --    GFR: Estimated Creatinine Clearance: 31.3 mL/min (A) (by C-G formula based on SCr of 1.52 mg/dL (H)). Liver Function Tests: Recent Labs  Lab 05/15/20 1044 05/17/20 1541  AST 28 35  ALT 31 29  ALKPHOS 224* 196*  BILITOT 0.8 0.7   PROT 8.1 8.1  ALBUMIN 4.0 4.2   Recent Labs  Lab 05/15/20 1044 05/17/20 1541  LIPASE 122* 165*   Recent Labs  Lab 05/15/20 1059  AMMONIA 28   Coagulation Profile: No results for input(s): INR, PROTIME in the last 168 hours. Cardiac Enzymes: No results for input(s): CKTOTAL, CKMB, CKMBINDEX, TROPONINI in the last 168 hours. BNP (  last 3 results) No results for input(s): PROBNP in the last 8760 hours. HbA1C: No results for input(s): HGBA1C in the last 72 hours. CBG: Recent Labs  Lab 05/17/20 1454 05/17/20 2008  GLUCAP 352* 271*   Lipid Profile: No results for input(s): CHOL, HDL, LDLCALC, TRIG, CHOLHDL, LDLDIRECT in the last 72 hours. Thyroid Function Tests: No results for input(s): TSH, T4TOTAL, FREET4, T3FREE, THYROIDAB in the last 72 hours. Anemia Panel: No results for input(s): VITAMINB12, FOLATE, FERRITIN, TIBC, IRON, RETICCTPCT in the last 72 hours. Urine analysis:    Component Value Date/Time   COLORURINE STRAW (A) 05/17/2020 1950   APPEARANCEUR CLEAR (A) 05/17/2020 1950   APPEARANCEUR Hazy 08/11/2013 0729   LABSPEC 1.001 (L) 05/17/2020 1950   LABSPEC 1.030 08/11/2013 0729   PHURINE 6.0 05/17/2020 1950   GLUCOSEU >=500 (A) 05/17/2020 1950   GLUCOSEU >=500 08/11/2013 0729   HGBUR NEGATIVE 05/17/2020 1950   BILIRUBINUR NEGATIVE 05/17/2020 1950   BILIRUBINUR Negative 08/11/2013 0729   KETONESUR NEGATIVE 05/17/2020 1950   PROTEINUR 100 (A) 05/17/2020 1950   NITRITE NEGATIVE 05/17/2020 1950   LEUKOCYTESUR TRACE (A) 05/17/2020 1950   LEUKOCYTESUR Negative 08/11/2013 0729    Radiological Exams on Admission: DG Abdomen Acute W/Chest  Result Date: 05/17/2020 CLINICAL DATA:  Abdominal pain and vomiting EXAM: DG ABDOMEN ACUTE WITH 1 VIEW CHEST COMPARISON:  CT abdomen and pelvis May 15, 2020 FINDINGS: PA chest: Lungs are clear. Heart size and pulmonary vascularity are normal. No adenopathy. There are surgical clips to the right of the trachea in the upper thoracic  region. Supine and upright abdomen: There is contrast within the colon. There is no appreciable bowel dilatation or air-fluid levels. No free air. Vascular calcification noted in the pelvis. IMPRESSION: No evident bowel obstruction or free air.  Lungs clear. Electronically Signed   By: Lowella Grip III M.D.   On: 05/17/2020 16:10     Assessment/Plan   Karen [name] is a 65 y.o. female with medical history significant for poorly controlled type 2 diabetes mellitus with most recent hemoglobin A1c 11.3% in March 2022, chronic abdominal pain, recurrent UTI, hypertension, hyperlipidemia, GERD, stage IIIb chronic kidney disease with baseline creatinine 1.4-1.5, chronically elevated alkaline phosphatase, who is admitted to [facility] on 05/17/2020 with intractable nausea/vomiting after presenting from home to [facility] ED complaining of ongoing nausea/vomiting.    Principal Problem:   Intractable nausea and vomiting Active Problems:   Poorly controlled type 2 diabetes mellitus (HCC)   Hypertension   HLD (hyperlipidemia)   GERD (gastroesophageal reflux disease)   CKD (chronic kidney disease), stage IIIa   Epigastric pain   Acute cystitis    #) Intractable nausea/vomiting: 4 to 5 days of recurrent nausea resulting in 10-15 episodes of nonbloody, nonbilious emesis over that time, with persistence of such and spite of as needed Zofran ODT as an outpatient, and resulting in the patient's inability to tolerate p.o. as an outpatient, including inability to tolerate any of her outpatient oral medications, including her recently initiated nitrofurantoin for acute cystitis which appears to be contributing to her nausea/vomiting given the report of such symptoms with prior diagnoses of acute cystitis.  As of nausea the patient has a history of pancreatitis, although the possibility of acute pancreatitis appears less likely at this time, particular given no significant interval worsening of  her abdominal pain relative to 05/15/2020 at which time she underwent CT abdomen/pelvis showing no evidence of acute or chronic pancreatitis, as further detailed below.  Of note, the patient is hyperglycemic, presentation is not consistent with DKA given no evidence of elevated anion gap metabolic acidosis at this time.  will check urinary drug screen to further evaluate her nausea/vomiting.  Differential for her chronic epigastric discomfort with presenting nausea/vomiting also includes the possibility of diabetic gastroparesis given history of poorly controlled type 2 diabetes mellitus with most recent hemoglobin A1c of greater than 11%.  No clinical or radiographic evidence of bowel obstruction at this time, including plain films of the abdomen showing no evidence of dilated loops of bowel.  Additionally, CT abdomen/pelvis on 05/15/2020 showed no evidence of acute cholecystitis or cholecystitis, while showing very small, 1 mm nonobstructing kidney stone in the medial right kidney.  Additionally, patient has a documented history of acquired hypothyroidism, but does not appear to be on thyroid supplementation at home.  Consequently, we will also check TSH.  If subsequent worsening of symptoms, can consider obtaining additional imaging of the abdomen, however there does not appear to be an indication to do so at this time, given no significant interval worsening of the patient's symptoms relative to 05/15/2020, at which time she underwent CT abdomen/pelvis, with results as further detailed above.  Overall, the patient is being admitted for further evaluation management of intractable nausea/vomiting and associated inability to tolerate p.o., with further indication for movement and symptomatic management on the basis of underlying/likely contributory acute cystitis and corresponding inability to treat this process in the setting of nausea/vomiting.  Plan: We will keep n.p.o. for now given patient's report of  exacerbation of her nausea/vomiting and immediately postprandial timeframe.  Lactated Ringer's at 125 cc/h.  Monitor strict I's and O's and daily weights.  As needed IV Ativan.  Add on serum magnesium level.  Repeat CMP and lipase in the morning.  Check urinary drug screen.  Add on serum ethanol level.  Check TSH, as above.  Work-up and management of acute cystitis, as further detailed below, including IV antibiotics.      #) Acute on chronic abdominal discomfort: The patient conveys to me that she experiences chronic epigastric discomfort, and that she has been experiencing 4 to 5 days of epigastric pain a slightly greater intensity relative to baseline, but otherwise with similar distribution and quality relative to that which she experiences on a chronic basis.  Historically, it appears that she experiences mild exacerbations of her chronic abdominal discomfort with prior diagnoses of recurrent urinary tract infection, as further detailed above, with suspected contribution from this underlying pathology relative to her symptoms over the last 4 to 5 days.  No significant increase in intensity and no change in distribution or quality of her abdominal pain in the interval following CT abdomen/pelvis on 05/15/2020, as further detailed above, when imaging essentially showed no evidence of acute intra-abdominal process, including no evidence of acute or chronic pancreatitis, no evidence of acute cholecystitis, and no evidence of bowel perforation, abscess, or obstruction.  Additionally, she underwent plain films of the abdomen this evening, which showed no evidence to suggest bowel obstruction or free air under the diaphragm.  Consequently, we will refrain from additional abdominal imaging at this time, and closely monitor for subsequent clinical worsening of the patient's symptoms or clinical picture to determine if ensuing indication for additional imaging arises.  No evidence of cholestatic pattern on  presenting labs this evening.  Within this framework, the patient has a history of chronically elevated alkaline phosphatase, with this value trending down relative to corresponding level performed on  05/15/2020.  Differential also includes the possibility of diabetic gastroparesis given poorly controlled type 2 diabetes mellitus, as further detailed above.   Plan: As needed IV Dilaudid.  IV fluids, as above.  Repeat CMP and lipase in the morning.  Consider referral to diabetic educator on an outpatient basis.  Repeat CBC in the morning.  Work-up and management of recent diagnosis of acute cystitis, including IV antibiotics.       #) Acute cystitis: Diagnosis on the basis of 4 to 5 days of new onset dysuria, with urinalysis performed on 05/15/2020 associated with significant pyuria in the absence of any squamous epithelial cells to suggest a contaminated specimen, with ensuing urine culture positive for greater than 100,000 colonies of staph lugdunensis, with chart review revealing pan sensitivity, including to the penicillin class.  She was discharged home from the ED on 05/05/2020 on a 5-day course of nitrofurantoin, but has been unable to tolerate this antibiotic in the setting of ongoing nausea/vomiting, as further detailed above.  While the patient does have a documented history of penicillin allergy, she has tolerated a dose of Rocephin administered in the ED today, without any overt ensuing side effect or allergic response noted.  Consequently, we will continue Rocephin given the pan sensitivity of the above microorganism.  Of note, the patient presents with a mildly elevated white blood cell count, but without sedation elevation to meet the associated 12,000 threshold for SIRS criteria.  Consequently, she does not meet criteria to be considered septic at this time.  No evidence of hypotension.   Plan: IV fluids, as above.  Add on blood cultures.  Continue Rocephin.  Repeat CBC with differential in the  morning.  Work-up and management of acute on chronic abdominal discomfort and nausea/vomiting, as above.        #) Type 2 diabetes mellitus: Outpatient insulin regimen consists of the following: Lantus 20 units subcu nightly as well as NovoLog 2 units scheduled 3 times daily with meals.  Not on any oral hypoglycemic agents at home.  Her diabetes appears to be poorly controlled, most recent hemoglobin A1c noted to be 11.3% when checked on 03/14/2020, potentially representing a contributory factor elective to her history of chronic abdominal discomfort associated with nausea/vomiting.  Presenting blood sugar elevated into the mid 300s, but in the absence of evidence of DKA given no corresponding elevated anion gap metabolic acidosis.  Suspect hyperglycemic exacerbation superimposed on poorly controlled diabetes in the setting of hemoconcentration given recent increase in GI losses given presenting nausea/vomiting as well as corresponding decline in oral intake over that time, with potential hyperglycemic contribution from acute infection in the setting of acute cystitis, as above.  Element of hemoconcentration appears to be further substantiated by repeat blood sugar trending down to 265 following administration of only interval IV fluids.  We will resume a component of her home Lantus, while closely monitoring ensuing blood sugar trend via POC Accu-Cheks and providing additional IV fluids.   Plan: IV fluids, as above.  Lantus 7 units subcu nightly, first dose now.  Accu-Cheks every 6 hours with moderate dose sliding scale insulin.  May benefit from a referral to diabetic educator, as further noted above.  Add on serum magnesium level.  Repeat BMP in the morning.  Work-up and management of acute cystitis, as above.       #) Essential hypertension: On Norvasc as her sole outpatient hypertensive agent.  Presenting systolic blood pressures noted to be in the 130s to 160s.  Plan: In the setting of current  n.p.o. status, will hold home Norvasc for now.  Close monitoring of ensuing blood pressure via routine vital signs.       #) Hyperlipidemia: On high intensity rosuvastatin as well as Vascepa as an outpatient.  Plan: In setting of current n.p.o. status, will hold home statin and Vascepa for now.       #) Depression: On Abilify as an outpatient.  Plan: Given current n.p.o. status, will hold home Abilify for now.      #) GERD: On omeprazole as well as famotidine as an outpatient.  Plan: In setting crania status, will hold home PPI and famotidine.  I have ordered daily IV Protonix while NPO.      #) Stage IIIb chronic kidney disease: Associated with baseline creatinine of 1.4-1.6.  Presenting labs reflect serum creatinine within this baseline range.  We will closely monitor interval renal function, particularly given recent nausea/vomiting with associated limited oral intake over that time.  Plan: IV fluids, as above.  Recommend management of presenting intractable nausea/vomiting, as above.  Monitor strict I's and O's and daily weights.  Tempt avoid nephrotoxic agents.  Repeat BMP in the morning.       DVT prophylaxis: SCDs Code Status: Full code Family Communication: none Disposition Plan: Per Rounding Team Consults called: none  Admission status: Observation; med telemetry     Of note, this patient was added by me to the following Admit List/Treatment Team: armcadmits.      PLEASE NOTE THAT DRAGON DICTATION SOFTWARE WAS USED IN THE CONSTRUCTION OF THIS NOTE.   [place] [facility] Pager [phone] From [place]  Otherwise, please contact night-coverage  [email or web address] Password [facility]   05/17/2020, 11:11 PM

## 2020-05-17 NOTE — ED Provider Notes (Signed)
[facility] Emergency Department Provider Note  ____________________________________________  Time seen: Approximately 4:29 PM  I have reviewed the triage vital signs and the nursing notes.   HISTORY  Chief Complaint Abdominal Pain    HPI Karen [name] is a 65 y.o. female with a history of alcoholic pancreatitis, depression, diabetes and DKA, hypertension, recurrent cystitis who comes the ED complaining of diffuse lower abdominal pain.  Seen in the ED 2 days ago, diagnosed with UTI and started on Macrobid which she has been taking.  However, pain is worse today so she returns for repeat evaluation.  Also complains of vomiting and diarrhea, generalized headache and fatigue.  No fever.  No chest pain or shortness of breath.  Symptoms are constant, waxing waning, no aggravating or alleviating factors.      Past Medical History:  Diagnosis Date  . Alcoholic pancreatitis   . Asthma   . Depression   . Diabetes mellitus without complication ([place])   . DKA (diabetic ketoacidoses) 02/17/2015  . Emphysematous cystitis   . Hypercholesteremia   . Hypertension   . Hypokalemia      Patient Active Problem List   Diagnosis Date Noted  . Intractable nausea and vomiting 05/17/2020  . Stroke-like symptom 11/15/2019  . Itching   . Diabetic polyneuropathy associated with type 2 diabetes mellitus ([place])   . Leukocytosis 06/13/2019  . Chronic kidney disease (CKD), stage II (mild)   . CKD (chronic kidney disease), stage IIIa 03/15/2019  . HTN (hypertension) 03/15/2019  . Type II diabetes mellitus with renal manifestations ([place]) 03/15/2019  . Headache 02/26/2019  . Dizziness 02/26/2019  . Hyperglycemia due to type 2 diabetes mellitus ([place]) 01/30/2019  . Acute pancreatitis 01/30/2019  . Constipation   . Acute on chronic pancreatitis ([place]) 01/25/2019  . Acute pain of left shoulder   . Adhesive capsulitis of left shoulder   . Abnormal LFTs 01/24/2019  . Cough 01/24/2019   . Hypertension   . HLD (hyperlipidemia)   . Type 2 diabetes mellitus with hyperlipidemia ([place])   . GERD (gastroesophageal reflux disease)   . Acute kidney injury superimposed on CKD ([place])   . Pancreatitis, recurrent   . Chronic pancreatitis ([place]) 09/25/2018  . 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 Coffee ground emesis   . Upper GI bleed 11/11/2017  . Pancreatitis, acute 01/03/2017  . ETOH abuse 01/03/2017  . UTI (urinary tract infection) 01/03/2017  . Nausea and vomiting   . Esophageal candidiasis ([place])   . Intractable vomiting with nausea   . Hypertensive urgency 04/21/2016  . Abdominal pain   . Nausea vomiting and diarrhea   . Smoker   . Poorly controlled type 2 diabetes mellitus ([place])   . Acute renal insufficiency 10/29/2015  . Elevated troponin 10/29/2015  . Urinary tract infection without hematuria 10/29/2015  . Left flank pain 10/29/2015  . Malignant essential hypertension 10/29/2015  . DKA (diabetic ketoacidoses) 02/17/2015  . Emphysematous cystitis      Past Surgical History:  Procedure Laterality Date  . ABDOMINAL HYSTERECTOMY  1996  . APPENDECTOMY  1997  . ESOPHAGOGASTRODUODENOSCOPY N/A 11/12/2017   Procedure: ESOPHAGOGASTRODUODENOSCOPY (EGD);  Surgeon: Lin Landsman, MD;  Location: [facility] ENDOSCOPY;  Service: Gastroenterology;  Laterality: N/A;  . ESOPHAGOGASTRODUODENOSCOPY N/A 09/29/2018   Procedure: ESOPHAGOGASTRODUODENOSCOPY (EGD);  Surgeon: Toledo, Benay Pike, MD;  Location: [facility] ENDOSCOPY;  Service: Gastroenterology;  Laterality: N/A;  . ESOPHAGOGASTRODUODENOSCOPY (EGD) WITH PROPOFOL N/A 04/24/2016   Procedure: ESOPHAGOGASTRODUODENOSCOPY (EGD) WITH PROPOFOL;  Surgeon: Evangeline Gula  Streetman Norris, MD;  Location: [facility] ENDOSCOPY;  Service: Endoscopy;  Laterality: N/A;  . ESOPHAGOGASTRODUODENOSCOPY (EGD) WITH PROPOFOL N/A 01/28/2019   Procedure: ESOPHAGOGASTRODUODENOSCOPY (EGD) WITH PROPOFOL;  Surgeon: Toledo, Benay Pike, MD;  Location: [facility] ENDOSCOPY;  Service:  Gastroenterology;  Laterality: N/A;  . EYE SURGERY    . HAND SURGERY  1988  . THYROID SURGERY  2013     Prior to Admission medications   Medication Sig Start Date End Date Taking? Authorizing Provider  amLODipine (NORVASC) 10 MG tablet Take 1 tablet (10 mg total) by mouth daily. 11/17/19   Pahwani, Michell Heinrich, MD  ARIPiprazole (ABILIFY) 5 MG tablet Take 1 tablet (5 mg total) by mouth daily. 11/17/19   Pahwani, Michell Heinrich, MD  cetirizine (ZYRTEC) 5 MG tablet Take 1 tablet (5 mg total) by mouth daily. 11/17/19 12/17/19  Pahwani, Michell Heinrich, MD  cyclobenzaprine (FLEXERIL) 5 MG tablet Take 5 mg by mouth 2 (two) times daily as needed for muscle spasms.     [provider]  docusate sodium (COLACE) 100 MG capsule Take 1 capsule (100 mg total) by mouth 2 (two) times daily. 11/17/19   Pahwani, Michell Heinrich, MD  famotidine (PEPCID) 20 MG tablet Take 1 tablet (20 mg total) by mouth 2 (two) times daily. 11/17/19   Pahwani, Michell Heinrich, MD  HYDROcodone-acetaminophen (NORCO/VICODIN) 5-325 MG tablet One tablet every 6 hours as needed for severe pain. 11/17/19   Pahwani, Michell Heinrich, MD  insulin aspart (NOVOLOG) 100 UNIT/ML FlexPen Inject 2 Units into the skin 3 (three) times daily with meals. 11/17/19   Pahwani, Michell Heinrich, MD  insulin glargine (LANTUS) 100 unit/mL SOPN Inject 20 Units into the skin at bedtime. 11/17/19   Pahwani, Michell Heinrich, MD  Insulin Pen Needle 29G X 12MM MISC 1 Dose by Does not apply route 3 (three) times daily before meals. 06/18/19   Loletha Grayer, MD  lipase/protease/amylase (CREON) 36000 UNITS CPEP capsule Take 2 capsules (72,000 Units total) by mouth 3 (three) times daily with meals. 11/17/19   Pahwani, Michell Heinrich, MD  nitrofurantoin, macrocrystal-monohydrate, (MACROBID) 100 MG capsule Take 1 capsule (100 mg total) by mouth 2 (two) times daily for 5 days. 05/15/20 05/20/20  Vladimir Crofts, MD  omeprazole (PRILOSEC) 40 MG capsule Take 1 capsule (40 mg total) by mouth in the morning and at bedtime. 11/17/19    Pahwani, Michell Heinrich, MD  ondansetron (ZOFRAN ODT) 4 MG disintegrating tablet Take 1 tablet (4 mg total) by mouth every 8 (eight) hours as needed for nausea or vomiting. 04/23/20   Blake Divine, MD  ondansetron (ZOFRAN ODT) 4 MG disintegrating tablet Take 1 tablet (4 mg total) by mouth every 8 (eight) hours as needed for nausea or vomiting. 05/15/20   Vladimir Crofts, MD  Polyethylene Glycol 3350 POWD Take 17 g by mouth at bedtime. Mix 17 grams in 4-8 oz of liquid and drink     [provider]  pregabalin (LYRICA) 75 MG capsule Take 1 capsule (75 mg total) by mouth 2 (two) times daily. 11/17/19   Pahwani, Michell Heinrich, MD  rosuvastatin (CRESTOR) 20 MG tablet Take 1 tablet (20 mg total) by mouth daily. 11/17/19   Pahwani, Michell Heinrich, MD  simethicone (MYLICON) 80 MG chewable tablet Chew 1 tablet (80 mg total) by mouth 4 (four) times daily. Patient taking differently: Chew 80 mg by mouth as needed.  06/18/19   Loletha Grayer, MD  triamcinolone cream (KENALOG) 0.5 % Apply twice a day to back of thighs 06/18/19  Loletha Grayer, MD  VASCEPA 0.5 g CAPS Take 4 capsules by mouth in the morning and at bedtime. 11/17/19   Pahwani, Michell Heinrich, MD  zolpidem (AMBIEN) 10 MG tablet Take 1 tablet by mouth at bedtime. 05/10/19   [provider]  GLUCERNA (GLUCERNA) LIQD Take 237 mLs by mouth 3 (three) times daily between meals. 02/19/15 02/27/15  Bettey Costa, MD     Allergies Penicillins, Reglan [metoclopramide], Fentanyl, and Percocet [oxycodone-acetaminophen]   Family History  Problem Relation Age of Onset  . Diabetes Mother     Social History Social History   Tobacco Use  . Smoking status: Former Research scientist (life sciences)  . Smokeless tobacco: Never Used  . Tobacco comment: quit 7 years ago   Vaping Use  . Vaping Use: Never used  Substance Use Topics  . Alcohol use: Not Currently    Comment: quit drinking 2 years ago   . Drug use: No    Review of Systems  Constitutional:   No fever or chills.  ENT:   No  sore throat. No rhinorrhea. Cardiovascular:   No chest pain or syncope. Respiratory:   No dyspnea or cough. Gastrointestinal:   Positive as above for abdominal pain, vomiting and diarrhea.  Musculoskeletal:   Negative for focal pain or swelling All other systems reviewed and are negative except as documented above in ROS and HPI.  ____________________________________________   PHYSICAL EXAM:  VITAL SIGNS: ED Triage Vitals  Enc Vitals Group     BP 05/17/20 1450 (!) 177/81     Pulse Rate 05/17/20 1450 80     Resp 05/17/20 1450 16     Temp 05/17/20 1450 98 F (36.7 C)     Temp Source 05/17/20 1450 Oral     SpO2 05/17/20 1450 98 %     Weight 05/17/20 1504 130 lb (59 kg)     Height 05/17/20 1504 5\' 1"  (1.549 m)     Head Circumference --      Peak Flow --      Pain Score 05/17/20 1504 6     Pain Loc --      Pain Edu? --      Excl. in [place]? --     Vital signs reviewed, nursing assessments reviewed.   Constitutional:   Alert and oriented. Non-toxic appearance. Eyes:   Conjunctivae are normal. EOMI. PERRL. ENT      Head:   Normocephalic and atraumatic.      Nose:   Wearing a mask.      Mouth/Throat:   Wearing a mask.      Neck:   No meningismus. Full ROM. Hematological/Lymphatic/Immunilogical:   No cervical lymphadenopathy. Cardiovascular:   RRR. Symmetric bilateral radial and DP pulses.  No murmurs. Cap refill less than 2 seconds. Respiratory:   Normal respiratory effort without tachypnea/retractions. Breath sounds are clear and equal bilaterally. No wheezes/rales/rhonchi. Gastrointestinal:   Soft with suprapubic tenderness. Mildly distended. There is no CVA tenderness.  No rebound, rigidity, or guarding. Genitourinary:   deferred Musculoskeletal:   Normal range of motion in all extremities. No joint effusions.  No lower extremity tenderness.  No edema. Neurologic:   Normal speech and language.  Motor grossly intact. No acute focal neurologic deficits are appreciated.  Skin:     Skin is warm, dry and intact. No rash noted.  No petechiae, purpura, or bullae.  ____________________________________________    LABS (pertinent positives/negatives) (all labs ordered are listed, but only abnormal results are displayed) Labs Reviewed  COMPREHENSIVE  METABOLIC PANEL - Abnormal; Notable for the following components:      Result Value   Sodium 130 (*)    Chloride 95 (*)    Glucose, Bld 362 (*)    Creatinine, Ser 1.52 (*)    Alkaline Phosphatase 196 (*)    GFR, Estimated 38 (*)    All other components within normal limits  LIPASE, BLOOD - Abnormal; Notable for the following components:   Lipase 165 (*)    All other components within normal limits  CBC WITH DIFFERENTIAL/PLATELET - Abnormal; Notable for the following components:   WBC 11.3 (*)    RDW 11.2 (*)    Neutro Abs 8.0 (*)    Abs Immature Granulocytes 0.13 (*)    All other components within normal limits  URINALYSIS, COMPLETE (UACMP) WITH MICROSCOPIC - Abnormal; Notable for the following components:   Color, Urine STRAW (*)    APPearance CLEAR (*)    Specific Gravity, Urine 1.001 (*)    Glucose, UA >=500 (*)    Protein, ur 100 (*)    Leukocytes,Ua TRACE (*)    All other components within normal limits  CBG MONITORING, ED - Abnormal; Notable for the following components:   Glucose-Capillary 352 (*)    All other components within normal limits  CBG MONITORING, ED - Abnormal; Notable for the following components:   Glucose-Capillary 271 (*)    All other components within normal limits  RESP PANEL BY RT-PCR (FLU A&B, COVID) ARPGX2  C DIFFICILE QUICK SCREEN W PCR REFLEX  MAGNESIUM   ____________________________________________   EKG    ____________________________________________    RADIOLOGY  DG Abdomen Acute W/Chest  Result Date: 05/17/2020 CLINICAL DATA:  Abdominal pain and vomiting EXAM: DG ABDOMEN ACUTE WITH 1 VIEW CHEST COMPARISON:  CT abdomen and pelvis May 15, 2020 FINDINGS: PA  chest: Lungs are clear. Heart size and pulmonary vascularity are normal. No adenopathy. There are surgical clips to the right of the trachea in the upper thoracic region. Supine and upright abdomen: There is contrast within the colon. There is no appreciable bowel dilatation or air-fluid levels. No free air. Vascular calcification noted in the pelvis. IMPRESSION: No evident bowel obstruction or free air.  Lungs clear. Electronically Signed   By: Lowella Grip III M.D.   On: 05/17/2020 16:10    ____________________________________________   PROCEDURES Procedures  ____________________________________________  DIFFERENTIAL DIAGNOSIS   Cystitis, viral illness, bowel obstruction, dehydration, electrolyte abnormality  CLINICAL IMPRESSION / ASSESSMENT AND PLAN / ED COURSE  Medications ordered in the ED: Medications  lactated ringers infusion (has no administration in time range)  LORazepam (ATIVAN) injection 0.5 mg (has no administration in time range)  HYDROmorphone (DILAUDID) injection 0.5 mg (has no administration in time range)  sodium chloride 0.9 % bolus 1,000 mL (0 mLs Intravenous Stopped 05/17/20 1718)  pantoprazole (PROTONIX) injection 40 mg (40 mg Intravenous Given 05/17/20 1616)  ondansetron (ZOFRAN) injection 4 mg (4 mg Intravenous Given 05/17/20 1616)  HYDROmorphone (DILAUDID) injection 0.5 mg (0.5 mg Intravenous Given 05/17/20 1616)  cefTRIAXone (ROCEPHIN) 1 g in sodium chloride 0.9 % 100 mL IVPB (0 g Intravenous Stopped 05/17/20 1645)  alum & mag hydroxide-simeth (MAALOX/MYLANTA) 200-200-20 MG/5ML suspension 30 mL (30 mLs Oral Given 05/17/20 2032)  famotidine (PEPCID) tablet 40 mg (40 mg Oral Given 05/17/20 2033)  haloperidol lactate (HALDOL) injection 2.5 mg (2.5 mg Intravenous Given 05/17/20 2230)  lactated ringers bolus 1,000 mL (1,000 mLs Intravenous New Bag/Given 05/17/20 2230)    Pertinent labs &  imaging results that were available during my care of the patient were reviewed  by me and considered in my medical decision making (see chart for details).  Karen [name] was evaluated in Emergency Department on 05/17/2020 for the symptoms described in the history of present illness. She was evaluated in the context of the global COVID-19 pandemic, which necessitated consideration that the patient might be at risk for infection with the SARS-CoV-2 virus that causes COVID-19. Institutional protocols and algorithms that pertain to the evaluation of patients at risk for COVID-19 are in a state of rapid change based on information released by regulatory bodies including the CDC and federal and state organizations. These policies and algorithms were followed during the patient's care in the ED.   Patient returns to ED after recent diagnosis of UTI and starting Macrobid.  Vital signs are unremarkable, she is nontoxic.  Reviewing electronic medical record, she had a CT scan 2 days ago which was unremarkable.  I do not think it will be fruitful to repeat imaging today without significant change in her exam or vitals.  We will repeat labs, recheck urinalysis to look for ketones, give IV fluids and symptomatic support.  We will obtain an x-ray to look for signs of obstruction.   ----------------------------------------- 10:52 PM on 05/17/2020 ----------------------------------------- Lipase elevated to 165, symptoms and exam consistent with pancreatitis.  Urinalysis and other labs unremarkable.  Symptoms are refractory to multiple rounds of antiemetics and IV fluids.  Case discussed with hospitalist for further management.      ____________________________________________   FINAL CLINICAL IMPRESSION(S) / ED DIAGNOSES    Final diagnoses:  Acute pancreatitis, unspecified complication status, unspecified pancreatitis type  Intractable vomiting with nausea, unspecified vomiting type     ED Discharge Orders    None      Portions of this note were generated with dragon dictation  software. Dictation errors may occur despite best attempts at proofreading.   Carrie Mew, MD 05/17/20 2253

## 2020-05-17 NOTE — Progress Notes (Signed)
Brief note regarding plan, with full H&P to follow:   65 year old female with history of chronic pancreatitis, diabetes mellitus, who is admitted with intractable nausea/vomiting.  Patient was diagnosed on 05/15/2020 with acute cystitis after presenting to [facility] ED complaining of 2 to 3 days of dysuria associated with nausea/vomiting, and abdominal discomfort.  CT abdomen/pelvis at that time showed nonobstructing 1 mm right nephrolithiasis, but otherwise showed no evidence of acute intra-abdominal process, including no evidence of pancreatitis. she was discharged from the ED on a 5-day course of nitrofurantoin for urinary tract infection.  However, she reports perpetuation of her nausea/vomiting and associated inability to tolerate p.o. over that time, including inability to tolerate any of her oral antibiotic.  He notes continuation of her abdominal discomfort, without any significant worsening in intensity/distribution in the interval since her presentation on Monday, and continues to report dysuria.  Started on Rocephin in the ED this evening. Will continue this antibiotic, provide continuous IV fluids.  As needed IV Ativan for nausea, as needed IV Dilaudid for abdominal pain.  She underwent plain films of the abdomen this evening which showed no evidence of acute process, including no evidence of bowel perforation.  Given the patient's abdominal pain is without any significant worsening in the interval since Monday, at which time CT abdomen/pelvis showed no acute intra-abdominal process, will refrain from additional imaging at this time, but may reconsider should the nature of patient's abdominal pain subsequently change or become worse.  Of note, the patient is also found to be hyperglycemic in the absence of any evidence of DKA. Will resume a portion of her home basal insulin, and have also ordered Accu-Cheks with sliding scale insulin.     Babs Bertin, DO Hospitalist

## 2020-05-17 NOTE — ED Notes (Signed)
Pt provided with water to attempt PO challenge. FSBS currently 271

## 2020-05-17 NOTE — ED Triage Notes (Signed)
Pt presents to the [facility] via EMS from home with c/o diffuse abd pain. Pt was seen here for same on 5/11 and diagnosed with UTI. Pt was prescribed Macrobid and is currently taking medication but states that pain has worsened.

## 2020-05-18 ENCOUNTER — Encounter: Payer: Self-pay | Admitting: Internal Medicine

## 2020-05-18 DIAGNOSIS — Z833 Family history of diabetes mellitus: Secondary | ICD-10-CM | POA: Diagnosis not present

## 2020-05-18 DIAGNOSIS — I129 Hypertensive chronic kidney disease with stage 1 through stage 4 chronic kidney disease, or unspecified chronic kidney disease: Secondary | ICD-10-CM | POA: Diagnosis present

## 2020-05-18 DIAGNOSIS — K859 Acute pancreatitis without necrosis or infection, unspecified: Secondary | ICD-10-CM | POA: Diagnosis present

## 2020-05-18 DIAGNOSIS — Z20822 Contact with and (suspected) exposure to covid-19: Secondary | ICD-10-CM | POA: Diagnosis present

## 2020-05-18 DIAGNOSIS — N3000 Acute cystitis without hematuria: Secondary | ICD-10-CM | POA: Diagnosis present

## 2020-05-18 DIAGNOSIS — E785 Hyperlipidemia, unspecified: Secondary | ICD-10-CM | POA: Diagnosis present

## 2020-05-18 DIAGNOSIS — Z794 Long term (current) use of insulin: Secondary | ICD-10-CM | POA: Diagnosis not present

## 2020-05-18 DIAGNOSIS — B957 Other staphylococcus as the cause of diseases classified elsewhere: Secondary | ICD-10-CM | POA: Diagnosis present

## 2020-05-18 DIAGNOSIS — N1832 Chronic kidney disease, stage 3b: Secondary | ICD-10-CM | POA: Diagnosis present

## 2020-05-18 DIAGNOSIS — Z87440 Personal history of urinary (tract) infections: Secondary | ICD-10-CM | POA: Diagnosis not present

## 2020-05-18 DIAGNOSIS — R112 Nausea with vomiting, unspecified: Secondary | ICD-10-CM | POA: Diagnosis present

## 2020-05-18 DIAGNOSIS — R1012 Left upper quadrant pain: Secondary | ICD-10-CM | POA: Diagnosis present

## 2020-05-18 DIAGNOSIS — Z87891 Personal history of nicotine dependence: Secondary | ICD-10-CM | POA: Diagnosis not present

## 2020-05-18 DIAGNOSIS — I160 Hypertensive urgency: Secondary | ICD-10-CM | POA: Diagnosis not present

## 2020-05-18 DIAGNOSIS — R1013 Epigastric pain: Secondary | ICD-10-CM | POA: Diagnosis not present

## 2020-05-18 DIAGNOSIS — E1122 Type 2 diabetes mellitus with diabetic chronic kidney disease: Secondary | ICD-10-CM | POA: Diagnosis present

## 2020-05-18 DIAGNOSIS — Z880 Allergy status to penicillin: Secondary | ICD-10-CM | POA: Diagnosis not present

## 2020-05-18 DIAGNOSIS — Z90710 Acquired absence of both cervix and uterus: Secondary | ICD-10-CM | POA: Diagnosis not present

## 2020-05-18 DIAGNOSIS — K5900 Constipation, unspecified: Secondary | ICD-10-CM | POA: Diagnosis not present

## 2020-05-18 DIAGNOSIS — K219 Gastro-esophageal reflux disease without esophagitis: Secondary | ICD-10-CM | POA: Diagnosis present

## 2020-05-18 DIAGNOSIS — F32A Depression, unspecified: Secondary | ICD-10-CM | POA: Diagnosis present

## 2020-05-18 DIAGNOSIS — E1165 Type 2 diabetes mellitus with hyperglycemia: Secondary | ICD-10-CM | POA: Diagnosis not present

## 2020-05-18 DIAGNOSIS — R7989 Other specified abnormal findings of blood chemistry: Secondary | ICD-10-CM | POA: Diagnosis not present

## 2020-05-18 DIAGNOSIS — E7800 Pure hypercholesterolemia, unspecified: Secondary | ICD-10-CM | POA: Diagnosis present

## 2020-05-18 DIAGNOSIS — Z79899 Other long term (current) drug therapy: Secondary | ICD-10-CM | POA: Diagnosis not present

## 2020-05-18 DIAGNOSIS — E11649 Type 2 diabetes mellitus with hypoglycemia without coma: Secondary | ICD-10-CM | POA: Diagnosis not present

## 2020-05-18 HISTORY — DX: Acute cystitis without hematuria: N30.00

## 2020-05-18 HISTORY — DX: Left upper quadrant pain: R10.12

## 2020-05-18 LAB — CBC
HCT: 30.1 % — ABNORMAL LOW (ref 36.0–46.0)
Hemoglobin: 10.9 g/dL — ABNORMAL LOW (ref 12.0–15.0)
MCH: 29.5 pg (ref 26.0–34.0)
MCHC: 36.2 g/dL — ABNORMAL HIGH (ref 30.0–36.0)
MCV: 81.6 fL (ref 80.0–100.0)
Platelets: 251 10*3/uL (ref 150–400)
RBC: 3.69 MIL/uL — ABNORMAL LOW (ref 3.87–5.11)
RDW: 11.3 % — ABNORMAL LOW (ref 11.5–15.5)
WBC: 10 10*3/uL (ref 4.0–10.5)
nRBC: 0 % (ref 0.0–0.2)

## 2020-05-18 LAB — URINE DRUG SCREEN, QUALITATIVE (ARMC ONLY)
Amphetamines, Ur Screen: NOT DETECTED
Barbiturates, Ur Screen: NOT DETECTED
Benzodiazepine, Ur Scrn: NOT DETECTED
Cannabinoid 50 Ng, Ur ~~LOC~~: NOT DETECTED
Cocaine Metabolite,Ur ~~LOC~~: NOT DETECTED
MDMA (Ecstasy)Ur Screen: NOT DETECTED
Methadone Scn, Ur: NOT DETECTED
Opiate, Ur Screen: POSITIVE — AB
Phencyclidine (PCP) Ur S: NOT DETECTED
Tricyclic, Ur Screen: POSITIVE — AB

## 2020-05-18 LAB — COMPREHENSIVE METABOLIC PANEL
ALT: 23 U/L (ref 0–44)
AST: 22 U/L (ref 15–41)
Albumin: 3.3 g/dL — ABNORMAL LOW (ref 3.5–5.0)
Alkaline Phosphatase: 157 U/L — ABNORMAL HIGH (ref 38–126)
Anion gap: 6 (ref 5–15)
BUN: 14 mg/dL (ref 8–23)
CO2: 26 mmol/L (ref 22–32)
Calcium: 8.5 mg/dL — ABNORMAL LOW (ref 8.9–10.3)
Chloride: 106 mmol/L (ref 98–111)
Creatinine, Ser: 1.18 mg/dL — ABNORMAL HIGH (ref 0.44–1.00)
GFR, Estimated: 52 mL/min — ABNORMAL LOW (ref >60)
Glucose, Bld: 232 mg/dL — ABNORMAL HIGH (ref 70–99)
Potassium: 3.7 mmol/L (ref 3.5–5.1)
Sodium: 138 mmol/L (ref 135–145)
Total Bilirubin: 0.4 mg/dL (ref 0.3–1.2)
Total Protein: 6.7 g/dL (ref 6.5–8.1)

## 2020-05-18 LAB — URINE CULTURE: Culture: >=100000 — AB

## 2020-05-18 LAB — PHOSPHORUS: Phosphorus: 2.7 mg/dL (ref 2.5–4.6)

## 2020-05-18 LAB — GLUCOSE, CAPILLARY
Glucose-Capillary: 265 mg/dL — ABNORMAL HIGH (ref 70–99)
Glucose-Capillary: 207 mg/dL — ABNORMAL HIGH (ref 70–99)
Glucose-Capillary: 195 mg/dL — ABNORMAL HIGH (ref 70–99)
Glucose-Capillary: 171 mg/dL — ABNORMAL HIGH (ref 70–99)

## 2020-05-18 LAB — ETHANOL: Alcohol, Ethyl (B): <10 mg/dL (ref <10)

## 2020-05-18 LAB — TSH: TSH: 3.657 u[IU]/mL (ref 0.350–4.500)

## 2020-05-18 LAB — HIV ANTIBODY (ROUTINE TESTING W REFLEX): HIV Screen 4th Generation wRfx: NONREACTIVE

## 2020-05-18 LAB — MAGNESIUM: Magnesium: 2 mg/dL (ref 1.7–2.4)

## 2020-05-18 LAB — LIPASE, BLOOD: Lipase: 64 U/L — ABNORMAL HIGH (ref 11–51)

## 2020-05-18 MED ORDER — PANTOPRAZOLE SODIUM 40 MG IV SOLR
40.0000 mg | INTRAVENOUS | Status: DC
  Administered 2020-05-18 – 2020-05-24 (×7): 40 mg via INTRAVENOUS
  Filled 2020-05-18 (×7): qty 40

## 2020-05-18 MED ORDER — ONDANSETRON HCL 4 MG/2ML IJ SOLN
4.0000 mg | Freq: Four times a day (QID) | INTRAMUSCULAR | Status: DC | PRN
  Administered 2020-05-18 – 2020-05-24 (×15): 4 mg via INTRAVENOUS
  Filled 2020-05-18 (×15): qty 2

## 2020-05-18 NOTE — Progress Notes (Signed)
Progress Note    Karen [name]  NIO:[id number] DOB: 09/30/1955  DOA: 05/17/2020 PCP: Lavera Guise, MD      Brief Narrative:    Medical records reviewed and are as summarized below:  Karen [name] is a 65 y.o. female with medical history significant for poorly controlled type II DM (recent hemoglobin A1c 11.3 in March 2022), chronic abdominal pain, recurrent UTI, hypertension, hyperlipidemia, GERD, stage IIIb CKD, chronically elevated alkaline phosphatase, recent diagnosis of UTI in the emergency room on 05/15/2020.  She presented to the hospital because of nausea, vomiting and abdominal pain.      Assessment/Plan:   Principal Problem:   Intractable nausea and vomiting Active Problems:   Poorly controlled type 2 diabetes mellitus (HCC)   Hypertension   HLD (hyperlipidemia)   GERD (gastroesophageal reflux disease)   CKD (chronic kidney disease), stage IIIa   Epigastric pain   Acute cystitis    Body mass index is 24.56 kg/m.   Acute pancreatitis: Continue NPO status.  Continue IV fluids for hydration.  Continue analgesics as needed for pain.  Acute UTI: continue IV Rocephin.  Urine culture from 05/15/2020 showed Staphylococcus lugdunensis.  It is not clear whether this is part of normal flora.  CKD stage IIIb: Creatinine is stable.  IDDM with hyperglycemia: She said she takes Lantus 34 units every evening and NovoLog 7 units with meals.  Continue Lantus at reduced dose because of n.p.o. status.  History of alcohol use disorder: She said she has not drank alcohol for 7 years.   Other comorbidities include hypertension, hyperlipidemia, depression, GERD   Diet Order            Diet NPO time specified  Diet effective now                    Consultants:  None  Procedures:  None    Medications:   . insulin aspart  0-9 Units Subcutaneous Q6H  . insulin glargine  7 Units Subcutaneous QHS  . pantoprazole (PROTONIX) IV  40 mg Intravenous Q24H    Continuous Infusions: . cefTRIAXone (ROCEPHIN)  IV    . lactated ringers 125 mL/hr at 05/18/20 1319     Anti-infectives (From admission, onward)   Start     Dose/Rate Route Frequency Ordered Stop   05/18/20 1600  cefTRIAXone (ROCEPHIN) 1 g in sodium chloride 0.9 % 100 mL IVPB        1 g 200 mL/hr over 30 Minutes Intravenous Every 24 hours 05/17/20 2310     05/17/20 1545  cefTRIAXone (ROCEPHIN) 1 g in sodium chloride 0.9 % 100 mL IVPB        1 g 200 mL/hr over 30 Minutes Intravenous  Once 05/17/20 1544 05/17/20 1645             Family Communication/Anticipated D/C date and plan/Code Status   DVT prophylaxis: SCDs Start: 05/17/20 2253     Code Status: Full Code  Family Communication: None Disposition Plan:    Status is: Observation  The patient will require care spanning > 2 midnights and should be moved to inpatient because: IV treatments appropriate due to intensity of illness or inability to take PO  Dispo: The patient is from: Home              Anticipated d/c is to: Home              Patient currently is not medically stable to d/c.  Difficult to place patient No           Subjective:   C/o nausea, abdominal pain and dysuria. No vomiting or diarrhea today  Objective:    Vitals:   05/18/20 0027 05/18/20 0408 05/18/20 0811 05/18/20 1153  BP: (!) 155/79 (!) 157/71 (!) 174/77 (!) 185/79  Pulse: 78 70 69 87  Resp:  18 16 18   Temp:  98.6 F (37 C) (!) 97.5 F (36.4 C) 98 F (36.7 C)  TempSrc:  Oral Oral   SpO2: 100% 100% 99% 100%  Weight:      Height:       No data found.   Intake/Output Summary (Last 24 hours) at 05/18/2020 1439 Last data filed at 05/18/2020 0607 Gross per 24 hour  Intake 653.09 ml  Output --  Net 653.09 ml   Filed Weights   05/17/20 1504  Weight: 59 kg    Exam:  GEN: NAD SKIN: Warm and dry EYES: EOMI ENT: MMM CV: RRR PULM: CTA B ABD: soft, distended, upper abdominal tenderness, +BS CNS: AAO x 3, non  focal EXT: No edema or tenderness        Data Reviewed:   I have personally reviewed following labs and imaging studies:  Labs: Labs show the following:   Basic Metabolic Panel: Recent Labs  Lab 05/15/20 1044 05/17/20 1541 05/18/20 0305  NA 133* 130* 138  K 4.0 3.8 3.7  CL 101 95* 106  CO2 20* 23 26  GLUCOSE 291* 362* 232*  BUN 25* 16 14  CREATININE 1.50* 1.52* 1.18*  CALCIUM 9.1 8.9 8.5*  MG 2.0 2.0 2.0  PHOS  --   --  2.7   GFR Estimated Creatinine Clearance: 40.3 mL/min (A) (by C-G formula based on SCr of 1.18 mg/dL (H)). Liver Function Tests: Recent Labs  Lab 05/15/20 1044 05/17/20 1541 05/18/20 0305  AST 28 35 22  ALT 31 29 23   ALKPHOS 224* 196* 157*  BILITOT 0.8 0.7 0.4  PROT 8.1 8.1 6.7  ALBUMIN 4.0 4.2 3.3*   Recent Labs  Lab 05/15/20 1044 05/17/20 1541 05/18/20 0305  LIPASE 122* 165* 64*   Recent Labs  Lab 05/15/20 1059  AMMONIA 28   Coagulation profile No results for input(s): INR, PROTIME in the last 168 hours.  CBC: Recent Labs  Lab 05/15/20 1044 05/17/20 1541 05/18/20 0305  WBC 9.0 11.3* 10.0  NEUTROABS 5.8 8.0*  --   HGB 12.3 13.0 10.9*  HCT 35.8* 36.1 30.1*  MCV 84.2 82.0 81.6  PLT 255 270 251   Cardiac Enzymes: No results for input(s): CKTOTAL, CKMB, CKMBINDEX, TROPONINI in the last 168 hours. BNP (last 3 results) No results for input(s): PROBNP in the last 8760 hours. CBG: Recent Labs  Lab 05/17/20 2008 05/17/20 2329 05/18/20 0107 05/18/20 0408 05/18/20 1149  GLUCAP 271* 323* 265* 207* 195*   D-Dimer: No results for input(s): DDIMER in the last 72 hours. Hgb A1c: No results for input(s): HGBA1C in the last 72 hours. Lipid Profile: No results for input(s): CHOL, HDL, LDLCALC, TRIG, CHOLHDL, LDLDIRECT in the last 72 hours. Thyroid function studies: Recent Labs    05/18/20 0305  TSH 3.657   Anemia work up: No results for input(s): VITAMINB12, FOLATE, FERRITIN, TIBC, IRON, RETICCTPCT in the last 72  hours. Sepsis Labs: Recent Labs  Lab 05/15/20 1044 05/17/20 1541 05/18/20 0305  WBC 9.0 11.3* 10.0    Microbiology Recent Results (from the past 240 hour(s))  Urine Culture  Status: Abnormal   Collection Time: 05/15/20  3:42 PM   Specimen: Urine, Random  Result Value Ref Range Status   Specimen Description   Final    URINE, RANDOM Performed at [facility], [address] [place], [place] [address]    Special Requests   Final    NONE Performed at [facility], [place], [place] [address]    Culture >=100,000 COLONIES/mL STAPHYLOCOCCUS LUGDUNENSIS (A)  Final   Report Status 05/18/2020 FINAL  Final   Organism ID, Bacteria STAPHYLOCOCCUS LUGDUNENSIS (A)  Final      Susceptibility   Staphylococcus lugdunensis - MIC*    CIPROFLOXACIN <=0.5 SENSITIVE Sensitive     GENTAMICIN <=0.5 SENSITIVE Sensitive     NITROFURANTOIN <=16 SENSITIVE Sensitive     OXACILLIN 2 SENSITIVE Sensitive     TETRACYCLINE <=1 SENSITIVE Sensitive     VANCOMYCIN <=0.5 SENSITIVE Sensitive     TRIMETH/SULFA <=10 SENSITIVE Sensitive     CLINDAMYCIN <=0.25 SENSITIVE Sensitive     RIFAMPIN <=0.5 SENSITIVE Sensitive     Inducible Clindamycin NEGATIVE Sensitive     * >=100,000 COLONIES/mL STAPHYLOCOCCUS LUGDUNENSIS  Resp Panel by RT-PCR (Flu A&B, Covid) Nasopharyngeal Swab     Status: None   Collection Time: 05/17/20  4:22 PM   Specimen: Nasopharyngeal Swab; Nasopharyngeal(NP) swabs in vial transport medium  Result Value Ref Range Status   SARS Coronavirus 2 by RT PCR NEGATIVE NEGATIVE Final    Comment: (NOTE) SARS-CoV-2 target nucleic acids are NOT DETECTED.  The SARS-CoV-2 RNA is generally detectable in upper respiratory specimens during the acute phase of infection. The lowest concentration of SARS-CoV-2 viral copies this assay can detect is 138 copies/mL. A negative result does not preclude SARS-Cov-2 infection and should not be used as the sole basis for  treatment or other patient management decisions. A negative result may occur with  improper specimen collection/handling, submission of specimen other than nasopharyngeal swab, presence of viral mutation(s) within the areas targeted by this assay, and inadequate number of viral copies(<138 copies/mL). A negative result must be combined with clinical observations, patient history, and epidemiological information. The expected result is Negative.  Fact Sheet for Patients:  EntrepreneurPulse.com.au  Fact Sheet for Healthcare Providers:  IncredibleEmployment.be  This test is no t yet approved or cleared by the [place] FDA and  has been authorized for detection and/or diagnosis of SARS-CoV-2 by FDA under an Emergency Use Authorization (EUA). This EUA will remain  in effect (meaning this test can be used) for the duration of the COVID-19 declaration under Section 564(b)(1) of the Act, 21 U.S.C.section 360bbb-3(b)(1), unless the authorization is terminated  or revoked sooner.       Influenza A by PCR NEGATIVE NEGATIVE Final   Influenza B by PCR NEGATIVE NEGATIVE Final    Comment: (NOTE) The Xpert Xpress SARS-CoV-2/FLU/RSV plus assay is intended as an aid in the diagnosis of influenza from Nasopharyngeal swab specimens and should not be used as a sole basis for treatment. Nasal washings and aspirates are unacceptable for Xpert Xpress SARS-CoV-2/FLU/RSV testing.  Fact Sheet for Patients: EntrepreneurPulse.com.au  Fact Sheet for Healthcare Providers: IncredibleEmployment.be  This test is not yet approved or cleared by the [place] FDA and has been authorized for detection and/or diagnosis of SARS-CoV-2 by FDA under an Emergency Use Authorization (EUA). This EUA will remain in effect (meaning this test can be used) for the duration of the COVID-19 declaration under Section 564(b)(1) of the Act, 21  U.S.C. section 360bbb-3(b)(1), unless the authorization is terminated or revoked.  Performed at [facility], [address] [place], [place] [address]   Culture, blood (Routine X 2) w Reflex to ID Panel     Status: None (Preliminary result)   Collection Time: 05/18/20  3:05 AM   Specimen: BLOOD  Result Value Ref Range Status   Specimen Description BLOOD RIGHT ANTECUBITAL  Final   Special Requests   Final    BOTTLES DRAWN AEROBIC AND ANAEROBIC Blood Culture adequate volume   Culture   Final    NO GROWTH < 12 HOURS Performed at [facility], [address], [place], [place] [address]    Report Status PENDING  Incomplete  Culture, blood (Routine X 2) w Reflex to ID Panel     Status: None (Preliminary result)   Collection Time: 05/18/20  3:06 AM   Specimen: BLOOD  Result Value Ref Range Status   Specimen Description BLOOD RIGHT HAND  Final   Special Requests   Final    BOTTLES DRAWN AEROBIC AND ANAEROBIC Blood Culture adequate volume   Culture   Final    NO GROWTH < 12 HOURS Performed at [facility], [address], [place], [place] [address]    Report Status PENDING  Incomplete    Procedures and diagnostic studies:  DG Abdomen Acute W/Chest  Result Date: 05/17/2020 CLINICAL DATA:  Abdominal pain and vomiting EXAM: DG ABDOMEN ACUTE WITH 1 VIEW CHEST COMPARISON:  CT abdomen and pelvis May 15, 2020 FINDINGS: PA chest: Lungs are clear. Heart size and pulmonary vascularity are normal. No adenopathy. There are surgical clips to the right of the trachea in the upper thoracic region. Supine and upright abdomen: There is contrast within the colon. There is no appreciable bowel dilatation or air-fluid levels. No free air. Vascular calcification noted in the pelvis. IMPRESSION: No evident bowel obstruction or free air.  Lungs clear. Electronically Signed   By: Lowella Grip III M.D.   On: 05/17/2020 16:10               LOS: 0 days   Talan Gildner  [facility]   Pager on [email or web address]. If 7PM-7AM, please contact night-coverage at [email or web address]     05/18/2020, 2:39 PM

## 2020-05-19 LAB — BASIC METABOLIC PANEL
Anion gap: 10 (ref 5–15)
BUN: 13 mg/dL (ref 8–23)
CO2: 26 mmol/L (ref 22–32)
Calcium: 8.5 mg/dL — ABNORMAL LOW (ref 8.9–10.3)
Chloride: 105 mmol/L (ref 98–111)
Creatinine, Ser: 1.2 mg/dL — ABNORMAL HIGH (ref 0.44–1.00)
GFR, Estimated: 51 mL/min — ABNORMAL LOW (ref >60)
Glucose, Bld: 174 mg/dL — ABNORMAL HIGH (ref 70–99)
Potassium: 3.5 mmol/L (ref 3.5–5.1)
Sodium: 141 mmol/L (ref 135–145)

## 2020-05-19 LAB — GLUCOSE, CAPILLARY
Glucose-Capillary: 143 mg/dL — ABNORMAL HIGH (ref 70–99)
Glucose-Capillary: 152 mg/dL — ABNORMAL HIGH (ref 70–99)
Glucose-Capillary: 174 mg/dL — ABNORMAL HIGH (ref 70–99)
Glucose-Capillary: 179 mg/dL — ABNORMAL HIGH (ref 70–99)
Glucose-Capillary: 268 mg/dL — ABNORMAL HIGH (ref 70–99)
Glucose-Capillary: 69 mg/dL — ABNORMAL LOW (ref 70–99)

## 2020-05-19 LAB — MAGNESIUM: Magnesium: 1.9 mg/dL (ref 1.7–2.4)

## 2020-05-19 LAB — PHOSPHORUS: Phosphorus: 2.8 mg/dL (ref 2.5–4.6)

## 2020-05-19 MED ORDER — ARIPIPRAZOLE 5 MG PO TABS
5.0000 mg | ORAL_TABLET | Freq: Every day | ORAL | Status: DC
  Filled 2020-05-19: qty 1

## 2020-05-19 MED ORDER — INSULIN ASPART 100 UNIT/ML IJ SOLN
0.0000 [IU] | Freq: Three times a day (TID) | INTRAMUSCULAR | Status: DC
  Administered 2020-05-20: 3 [IU] via SUBCUTANEOUS
  Administered 2020-05-20: 7 [IU] via SUBCUTANEOUS
  Administered 2020-05-21: 3 [IU] via SUBCUTANEOUS
  Administered 2020-05-21: 5 [IU] via SUBCUTANEOUS
  Administered 2020-05-22: 1 [IU] via SUBCUTANEOUS
  Administered 2020-05-22: 3 [IU] via SUBCUTANEOUS
  Administered 2020-05-22 – 2020-05-23 (×2): 1 [IU] via SUBCUTANEOUS
  Administered 2020-05-23 – 2020-05-24 (×3): 3 [IU] via SUBCUTANEOUS
  Filled 2020-05-19 (×10): qty 1

## 2020-05-19 MED ORDER — SODIUM CHLORIDE 0.9 % IV SOLN
12.5000 mg | Freq: Four times a day (QID) | INTRAVENOUS | Status: DC | PRN
  Administered 2020-05-19 – 2020-05-23 (×4): 12.5 mg via INTRAVENOUS
  Filled 2020-05-19 (×5): qty 0.5
  Filled 2020-05-19: qty 12.5

## 2020-05-19 MED ORDER — PANCRELIPASE (LIP-PROT-AMYL) 36000-114000 UNITS PO CPEP
72000.0000 [IU] | ORAL_CAPSULE | Freq: Three times a day (TID) | ORAL | Status: DC
  Administered 2020-05-19 – 2020-05-24 (×15): 72000 [IU] via ORAL
  Filled 2020-05-19 (×19): qty 2

## 2020-05-19 MED ORDER — HYDROMORPHONE HCL 1 MG/ML IJ SOLN
0.5000 mg | INTRAMUSCULAR | Status: DC | PRN
  Administered 2020-05-20 – 2020-05-24 (×13): 0.5 mg via INTRAVENOUS
  Filled 2020-05-19 (×13): qty 0.5

## 2020-05-19 MED ORDER — DOXYCYCLINE HYCLATE 100 MG PO TABS
100.0000 mg | ORAL_TABLET | Freq: Two times a day (BID) | ORAL | Status: AC
  Administered 2020-05-19 – 2020-05-20 (×3): 100 mg via ORAL
  Filled 2020-05-19 (×3): qty 1

## 2020-05-19 MED ORDER — ROSUVASTATIN CALCIUM 10 MG PO TABS
20.0000 mg | ORAL_TABLET | Freq: Every day | ORAL | Status: DC
  Administered 2020-05-19 – 2020-05-24 (×6): 20 mg via ORAL
  Filled 2020-05-19 (×6): qty 2

## 2020-05-19 MED ORDER — CYCLOBENZAPRINE HCL 10 MG PO TABS
5.0000 mg | ORAL_TABLET | Freq: Two times a day (BID) | ORAL | Status: DC | PRN
  Administered 2020-05-19 – 2020-05-20 (×2): 5 mg via ORAL
  Filled 2020-05-19 (×2): qty 1

## 2020-05-19 MED ORDER — ZOLPIDEM TARTRATE 5 MG PO TABS
5.0000 mg | ORAL_TABLET | Freq: Every day | ORAL | Status: DC
  Administered 2020-05-19 – 2020-05-23 (×5): 5 mg via ORAL
  Filled 2020-05-19 (×5): qty 1

## 2020-05-19 MED ORDER — HYDROCHLOROTHIAZIDE 25 MG PO TABS
25.0000 mg | ORAL_TABLET | Freq: Every day | ORAL | Status: DC
  Administered 2020-05-19 – 2020-05-21 (×3): 25 mg via ORAL
  Filled 2020-05-19 (×3): qty 1

## 2020-05-19 MED ORDER — PREGABALIN 75 MG PO CAPS
75.0000 mg | ORAL_CAPSULE | Freq: Two times a day (BID) | ORAL | Status: DC
  Administered 2020-05-19 – 2020-05-24 (×11): 75 mg via ORAL
  Filled 2020-05-19 (×11): qty 1

## 2020-05-19 MED ORDER — INSULIN ASPART 100 UNIT/ML IJ SOLN
0.0000 [IU] | Freq: Every day | INTRAMUSCULAR | Status: DC
  Administered 2020-05-19: 3 [IU] via SUBCUTANEOUS
  Filled 2020-05-19: qty 1

## 2020-05-19 MED ORDER — AMLODIPINE BESYLATE 10 MG PO TABS
10.0000 mg | ORAL_TABLET | Freq: Every day | ORAL | Status: DC
  Administered 2020-05-19 – 2020-05-24 (×6): 10 mg via ORAL
  Filled 2020-05-19 (×6): qty 1

## 2020-05-19 NOTE — Progress Notes (Signed)
Patient complaining of nausea and vomiting.  Charge nurse and other staff in to assist patient.  Patient stated she felt that she could take her po medication stayed in room - patient coughed after getting medication but no emesis.  Waited in room for some time after medications given.

## 2020-05-19 NOTE — Progress Notes (Addendum)
Progress Note    LUVA NONDORF  [id number] DOB: 02-04-1955  DOA: 05/17/2020 PCP: Lavera Guise, MD      Brief Narrative:    Medical records reviewed and are as summarized below:  Karen [name] is a 65 y.o. female with medical history significant for poorly controlled type II DM (recent hemoglobin A1c 11.3 in March 2022), chronic abdominal pain, recurrent UTI, hypertension, hyperlipidemia, GERD, stage IIIb CKD, chronically elevated alkaline phosphatase, recent diagnosis of UTI in the emergency room on 05/15/2020.  She presented to the hospital because of nausea, vomiting and abdominal pain.      Assessment/Plan:   Principal Problem:   Intractable nausea and vomiting Active Problems:   Poorly controlled type 2 diabetes mellitus (HCC)   Pancreatitis, acute   Hypertension   HLD (hyperlipidemia)   GERD (gastroesophageal reflux disease)   CKD (chronic kidney disease), stage IIIa   Epigastric pain   Acute cystitis    Body mass index is 23.74 kg/m.   Acute pancreatitis: Start clear liquid diet.  Discontinue IV fluids.  Continue analgesics as needed for pain.  Lipase is down from 165-64.  Acute UTI: Discontinue IV Rocephin and start doxycycline.  Urine culture from 05/15/2020 showed Staphylococcus lugdunensis.  It is not clear whether this is part of normal flora.  CKD stage IIIb: Creatinine is stable.  IDDM with hyperglycemia: She said she takes Lantus 34 units every evening and NovoLog 7 units with meals.  Continue Lantus at reduced dose because of n.p.o. status.  Hypertension, hypertensive urgency: Continue amlodipine.  Add HCTZ for adequate BP control.  History of alcohol use disorder: She said she has not drank alcohol for 7 years.   Other comorbidities include hyperlipidemia, depression, GERD   Diet Order            Diet clear liquid Room service appropriate? Karen [name]; Fluid consistency: Thin  Diet effective now                     Consultants:  None  Procedures:  None    Medications:   . amLODipine  10 mg Oral Daily  . doxycycline  100 mg Oral Q12H  . hydrochlorothiazide  25 mg Oral Daily  . insulin aspart  0-9 Units Subcutaneous Q6H  . insulin glargine  7 Units Subcutaneous QHS  . lipase/protease/amylase  72,000 Units Oral TID WC  . pantoprazole (PROTONIX) IV  40 mg Intravenous Q24H  . pregabalin  75 mg Oral BID  . rosuvastatin  20 mg Oral Daily  . zolpidem  5 mg Oral QHS   Continuous Infusions: . promethazine (PHENERGAN) injection (IM or IVPB) 12.5 mg (05/19/20 1030)     Anti-infectives (From admission, onward)   Start     Dose/Rate Route Frequency Ordered Stop   05/19/20 1000  doxycycline (VIBRA-TABS) tablet 100 mg        100 mg Oral Every 12 hours 05/19/20 0810 05/20/20 2159   05/18/20 1600  cefTRIAXone (ROCEPHIN) 1 g in sodium chloride 0.9 % 100 mL IVPB  Status:  Discontinued        1 g 200 mL/hr over 30 Minutes Intravenous Every 24 hours 05/17/20 2310 05/19/20 0810   05/17/20 1545  cefTRIAXone (ROCEPHIN) 1 g in sodium chloride 0.9 % 100 mL IVPB        1 g 200 mL/hr over 30 Minutes Intravenous  Once 05/17/20 1544 05/17/20 1645  Family Communication/Anticipated D/C date and plan/Code Status   DVT prophylaxis: SCDs Start: 05/17/20 2253     Code Status: Full Code  Family Communication: None Disposition Plan:    Status is: Observation  The patient will require care spanning > 2 midnights and should be moved to inpatient because: IV treatments appropriate due to intensity of illness or inability to take PO  Dispo: The patient is from: Home              Anticipated d/c is to: Home              Patient currently is not medically stable to d/c.   Difficult to place patient No           Subjective:   She said she vomited early this morning around 3:30 am.  No diarrhea.  She still has some abdominal pain but not as bad as before.  She wants to try  a liquid diet today.  Objective:    Vitals:   05/19/20 0500 05/19/20 0545 05/19/20 0757 05/19/20 1123  BP:  (!) 161/78 (!) 192/76 (!) 184/72  Pulse:  77 72 81  Resp:  18 16 18   Temp:  98.9 F (37.2 C) 98.5 F (36.9 C) 99 F (37.2 C)  TempSrc:   Oral Oral  SpO2:  94% 100% 96%  Weight: 57 kg     Height:       No data found.   Intake/Output Summary (Last 24 hours) at 05/19/2020 1502 Last data filed at 05/19/2020 0500 Gross per 24 hour  Intake 2015.24 ml  Output 25 ml  Net 1990.24 ml   Filed Weights   05/17/20 1504 05/19/20 0500  Weight: 59 kg 57 kg    Exam:  GEN: NAD SKIN: No rash EYES: EOMI ENT: MMM CV: RRR PULM: CTA B ABD: soft, ND, mild upper abdominal tenderness, no rebound tenderness or guarding, +BS CNS: AAO x 3, non focal EXT: No edema or tenderness        Data Reviewed:   I have personally reviewed following labs and imaging studies:  Labs: Labs show the following:   Basic Metabolic Panel: Recent Labs  Lab 05/15/20 1044 05/17/20 1541 05/18/20 0305 05/19/20 0420  NA 133* 130* 138 141  K 4.0 3.8 3.7 3.5  CL 101 95* 106 105  CO2 20* 23 26 26   GLUCOSE 291* 362* 232* 174*  BUN 25* 16 14 13   CREATININE 1.50* 1.52* 1.18* 1.20*  CALCIUM 9.1 8.9 8.5* 8.5*  MG 2.0 2.0 2.0 1.9  PHOS  --   --  2.7 2.8   GFR Estimated Creatinine Clearance: 36.2 mL/min (A) (by C-G formula based on SCr of 1.2 mg/dL (H)). Liver Function Tests: Recent Labs  Lab 05/15/20 1044 05/17/20 1541 05/18/20 0305  AST 28 35 22  ALT 31 29 23   ALKPHOS 224* 196* 157*  BILITOT 0.8 0.7 0.4  PROT 8.1 8.1 6.7  ALBUMIN 4.0 4.2 3.3*   Recent Labs  Lab 05/15/20 1044 05/17/20 1541 05/18/20 0305  LIPASE 122* 165* 64*   Recent Labs  Lab 05/15/20 1059  AMMONIA 28   Coagulation profile No results for input(s): INR, PROTIME in the last 168 hours.  CBC: Recent Labs  Lab 05/15/20 1044 05/17/20 1541 05/18/20 0305  WBC 9.0 11.3* 10.0  NEUTROABS 5.8 8.0*  --   HGB  12.3 13.0 10.9*  HCT 35.8* 36.1 30.1*  MCV 84.2 82.0 81.6  PLT 255 270 251   Cardiac  Enzymes: No results for input(s): CKTOTAL, CKMB, CKMBINDEX, TROPONINI in the last 168 hours. BNP (last 3 results) No results for input(s): PROBNP in the last 8760 hours. CBG: Recent Labs  Lab 05/18/20 1149 05/18/20 1843 05/19/20 0011 05/19/20 0618 05/19/20 1125  GLUCAP 195* 171* 143* 179* 152*   D-Dimer: No results for input(s): DDIMER in the last 72 hours. Hgb A1c: No results for input(s): HGBA1C in the last 72 hours. Lipid Profile: No results for input(s): CHOL, HDL, LDLCALC, TRIG, CHOLHDL, LDLDIRECT in the last 72 hours. Thyroid function studies: Recent Labs    05/18/20 0305  TSH 3.657   Anemia work up: No results for input(s): VITAMINB12, FOLATE, FERRITIN, TIBC, IRON, RETICCTPCT in the last 72 hours. Sepsis Labs: Recent Labs  Lab 05/15/20 1044 05/17/20 1541 05/18/20 0305  WBC 9.0 11.3* 10.0    Microbiology Recent Results (from the past 240 hour(s))  Urine Culture     Status: Abnormal   Collection Time: 05/15/20  3:42 PM   Specimen: Urine, Random  Result Value Ref Range Status   Specimen Description   Final    URINE, RANDOM Performed at [facility], [address] [place], [place] [address]    Special Requests   Final    NONE Performed at [facility], [place],  [address]    Culture >=100,000 COLONIES/mL STAPHYLOCOCCUS LUGDUNENSIS (A)  Final   Report Status 05/18/2020 FINAL  Final   Organism ID, Bacteria STAPHYLOCOCCUS LUGDUNENSIS (A)  Final      Susceptibility   Staphylococcus lugdunensis - MIC*    CIPROFLOXACIN <=0.5 SENSITIVE Sensitive     GENTAMICIN <=0.5 SENSITIVE Sensitive     NITROFURANTOIN <=16 SENSITIVE Sensitive     OXACILLIN 2 SENSITIVE Sensitive     TETRACYCLINE <=1 SENSITIVE Sensitive     VANCOMYCIN <=0.5 SENSITIVE Sensitive     TRIMETH/SULFA <=10 SENSITIVE Sensitive     CLINDAMYCIN <=0.25 SENSITIVE  Sensitive     RIFAMPIN <=0.5 SENSITIVE Sensitive     Inducible Clindamycin NEGATIVE Sensitive     * >=100,000 COLONIES/mL STAPHYLOCOCCUS LUGDUNENSIS  Resp Panel by RT-PCR (Flu A&B, Covid) Nasopharyngeal Swab     Status: None   Collection Time: 05/17/20  4:22 PM   Specimen: Nasopharyngeal Swab; Nasopharyngeal(NP) swabs in vial transport medium  Result Value Ref Range Status   SARS Coronavirus 2 by RT PCR NEGATIVE NEGATIVE Final    Comment: (NOTE) SARS-CoV-2 target nucleic acids are NOT DETECTED.  The SARS-CoV-2 RNA is generally detectable in upper respiratory specimens during the acute phase of infection. The lowest concentration of SARS-CoV-2 viral copies this assay can detect is 138 copies/mL. A negative result does not preclude SARS-Cov-2 infection and should not be used as the sole basis for treatment or other patient management decisions. A negative result may occur with  improper specimen collection/handling, submission of specimen other than nasopharyngeal swab, presence of viral mutation(s) within the areas targeted by this assay, and inadequate number of viral copies(<138 copies/mL). A negative result must be combined with clinical observations, patient history, and epidemiological information. The expected result is Negative.  Fact Sheet for Patients:  EntrepreneurPulse.com.au  Fact Sheet for Healthcare Providers:  IncredibleEmployment.be  This test is no t yet approved or cleared by the [place] FDA and  has been authorized for detection and/or diagnosis of SARS-CoV-2 by FDA under an Emergency Use Authorization (EUA). This EUA will remain  in effect (meaning this test can be used) for the duration of the COVID-19  declaration under Section 564(b)(1) of the Act, 21 U.S.C.section 360bbb-3(b)(1), unless the authorization is terminated  or revoked sooner.       Influenza A by PCR NEGATIVE NEGATIVE Final   Influenza B by PCR  NEGATIVE NEGATIVE Final    Comment: (NOTE) The Xpert Xpress SARS-CoV-2/FLU/RSV plus assay is intended as an aid in the diagnosis of influenza from Nasopharyngeal swab specimens and should not be used as a sole basis for treatment. Nasal washings and aspirates are unacceptable for Xpert Xpress SARS-CoV-2/FLU/RSV testing.  Fact Sheet for Patients: EntrepreneurPulse.com.au  Fact Sheet for Healthcare Providers: IncredibleEmployment.be  This test is not yet approved or cleared by the [place] FDA and has been authorized for detection and/or diagnosis of SARS-CoV-2 by FDA under an Emergency Use Authorization (EUA). This EUA will remain in effect (meaning this test can be used) for the duration of the COVID-19 declaration under Section 564(b)(1) of the Act, 21 U.S.C. section 360bbb-3(b)(1), unless the authorization is terminated or revoked.  Performed at [facility], [address] [place], [place] [address]   Culture, blood (Routine X 2) w Reflex to ID Panel     Status: None (Preliminary result)   Collection Time: 05/18/20  3:05 AM   Specimen: BLOOD  Result Value Ref Range Status   Specimen Description BLOOD RIGHT ANTECUBITAL  Final   Special Requests   Final    BOTTLES DRAWN AEROBIC AND ANAEROBIC Blood Culture adequate volume   Culture   Final    NO GROWTH 1 DAY Performed at [facility], [address], [place], [place] [address]    Report Status PENDING  Incomplete  Culture, blood (Routine X 2) w Reflex to ID Panel     Status: None (Preliminary result)   Collection Time: 05/18/20  3:06 AM   Specimen: BLOOD  Result Value Ref Range Status   Specimen Description BLOOD RIGHT HAND  Final   Special Requests   Final    BOTTLES DRAWN AEROBIC AND ANAEROBIC Blood Culture adequate volume   Culture   Final    NO GROWTH 1 DAY Performed at [facility], [address] [place], [place] [address]    Report Status  PENDING  Incomplete    Procedures and diagnostic studies:  DG Abdomen Acute W/Chest  Result Date: 05/17/2020 CLINICAL DATA:  Abdominal pain and vomiting EXAM: DG ABDOMEN ACUTE WITH 1 VIEW CHEST COMPARISON:  CT abdomen and pelvis May 15, 2020 FINDINGS: PA chest: Lungs are clear. Heart size and pulmonary vascularity are normal. No adenopathy. There are surgical clips to the right of the trachea in the upper thoracic region. Supine and upright abdomen: There is contrast within the colon. There is no appreciable bowel dilatation or air-fluid levels. No free air. Vascular calcification noted in the pelvis. IMPRESSION: No evident bowel obstruction or free air.  Lungs clear. Electronically Signed   By: Lowella Grip III M.D.   On: 05/17/2020 16:10               LOS: 1 day   Cathy Ropp  [facility]   Pager on [email or web address]. If 7PM-7AM, please contact night-coverage at [email or web address]     05/19/2020, 3:02 PM

## 2020-05-19 NOTE — Progress Notes (Signed)
Medicated with po medication.  Stayed in room to see if patient became nauseated.  Medicated for pain at patients request

## 2020-05-19 NOTE — Plan of Care (Signed)

## 2020-05-20 DIAGNOSIS — K859 Acute pancreatitis without necrosis or infection, unspecified: Secondary | ICD-10-CM

## 2020-05-20 LAB — BASIC METABOLIC PANEL
Anion gap: 9 (ref 5–15)
BUN: 22 mg/dL (ref 8–23)
CO2: 26 mmol/L (ref 22–32)
Calcium: 8.1 mg/dL — ABNORMAL LOW (ref 8.9–10.3)
Chloride: 97 mmol/L — ABNORMAL LOW (ref 98–111)
Creatinine, Ser: 1.8 mg/dL — ABNORMAL HIGH (ref 0.44–1.00)
GFR, Estimated: 31 mL/min — ABNORMAL LOW (ref >60)
Glucose, Bld: 510 mg/dL (ref 70–99)
Potassium: 4.4 mmol/L (ref 3.5–5.1)
Sodium: 132 mmol/L — ABNORMAL LOW (ref 135–145)

## 2020-05-20 LAB — GLUCOSE, CAPILLARY
Glucose-Capillary: 210 mg/dL — ABNORMAL HIGH (ref 70–99)
Glucose-Capillary: 231 mg/dL — ABNORMAL HIGH (ref 70–99)
Glucose-Capillary: 321 mg/dL — ABNORMAL HIGH (ref 70–99)
Glucose-Capillary: 393 mg/dL — ABNORMAL HIGH (ref 70–99)
Glucose-Capillary: 496 mg/dL — ABNORMAL HIGH (ref 70–99)
Glucose-Capillary: 52 mg/dL — ABNORMAL LOW (ref 70–99)
Glucose-Capillary: 79 mg/dL (ref 70–99)

## 2020-05-20 LAB — HEMOGLOBIN A1C
Hgb A1c MFr Bld: 10.1 % — ABNORMAL HIGH (ref 4.8–5.6)
Mean Plasma Glucose: 243.17 mg/dL

## 2020-05-20 MED ORDER — INSULIN GLARGINE 100 UNIT/ML ~~LOC~~ SOLN: 15.0000 [IU] | Freq: Once | SUBCUTANEOUS | Status: DC

## 2020-05-20 MED ORDER — INSULIN ASPART 100 UNIT/ML IJ SOLN
0.0000 [IU] | Freq: Every day | INTRAMUSCULAR | Status: DC
  Administered 2020-05-21: 2 [IU] via SUBCUTANEOUS
  Administered 2020-05-23: 3 [IU] via SUBCUTANEOUS
  Filled 2020-05-20 (×3): qty 1

## 2020-05-20 MED ORDER — INSULIN ASPART 100 UNIT/ML IJ SOLN
10.0000 [IU] | Freq: Once | INTRAMUSCULAR | Status: AC
  Administered 2020-05-20: 10 [IU] via SUBCUTANEOUS

## 2020-05-20 MED ORDER — INSULIN GLARGINE 100 UNIT/ML ~~LOC~~ SOLN
25.0000 [IU] | Freq: Every day | SUBCUTANEOUS | Status: DC
  Filled 2020-05-20: qty 0.25

## 2020-05-20 MED ORDER — INSULIN GLARGINE 100 UNIT/ML ~~LOC~~ SOLN
10.0000 [IU] | Freq: Once | SUBCUTANEOUS | Status: AC
  Administered 2020-05-20: 10 [IU] via SUBCUTANEOUS
  Filled 2020-05-20: qty 0.1

## 2020-05-20 MED ORDER — INSULIN ASPART 100 UNIT/ML IJ SOLN
7.0000 [IU] | Freq: Three times a day (TID) | INTRAMUSCULAR | Status: DC
  Administered 2020-05-20: 7 [IU] via SUBCUTANEOUS
  Filled 2020-05-20: qty 1

## 2020-05-20 MED ORDER — INSULIN GLARGINE 100 UNIT/ML ~~LOC~~ SOLN
8.0000 [IU] | Freq: Two times a day (BID) | SUBCUTANEOUS | Status: DC
  Administered 2020-05-20: 8 [IU] via SUBCUTANEOUS
  Filled 2020-05-20 (×3): qty 0.08

## 2020-05-20 NOTE — Progress Notes (Signed)
Progress Note    Karen [name]  [id number] DOB: 02-Jan-1956  DOA: 05/17/2020 PCP: Lavera Guise, MD      Brief Narrative:    Medical records reviewed and are as summarized below:  Karen [name] is a 65 y.o. female with medical history significant for poorly controlled type II DM (recent hemoglobin A1c 11.3 in March 2022), chronic abdominal pain, recurrent UTI, hypertension, hyperlipidemia, GERD, stage IIIb CKD, chronically elevated alkaline phosphatase, recent diagnosis of UTI in the emergency room on 05/15/2020.  She presented to the hospital because of nausea, vomiting and abdominal pain.      Assessment/Plan:   Principal Problem:   Intractable nausea and vomiting Active Problems:   Poorly controlled type 2 diabetes mellitus (HCC)   Pancreatitis, acute   Hypertension   HLD (hyperlipidemia)   GERD (gastroesophageal reflux disease)   CKD (chronic kidney disease), stage IIIa   Epigastric pain   Acute cystitis    Body mass index is 23.74 kg/m.   Acute pancreatitis: Advance diet to diabetic diet.  Continue analgesics as needed for pain.  Lipase is down from 165-64.  Acute UTI: Continue doxycycline.  Urine culture from 05/15/2020 showed Staphylococcus lugdunensis.  It is not clear whether this is part of normal flora.  CKD stage IIIb: Creatinine is stable.  IDDM with hyperglycemia and hypoglycemia: History of hypoglycemia with glucose of 69 yesterday and glucose of 52 today.  Continue Lantus.  NovoLog as needed for hyperglycemia.  Monitor glucose levels closely. She said her insulin regimen was increased about a month ago to Lantus 34 units nightly and NovoLog 7 units 3 times daily with meals.   Hypertension, hypertensive urgency: BP is better.  Continue antihypertensives.  History of alcohol use disorder: She said she has not drank alcohol for 7 years.  Other comorbidities include hyperlipidemia, depression, GERD   Diet Order            Diet Carb Modified  Fluid consistency: Thin; Room service appropriate? Yes  Diet effective now                    Consultants:  None  Procedures:  None    Medications:   . amLODipine  10 mg Oral Daily  . hydrochlorothiazide  25 mg Oral Daily  . insulin aspart  0-9 Units Subcutaneous TID WC  . insulin aspart  7 Units Subcutaneous TID WC  . insulin glargine  25 Units Subcutaneous QHS  . lipase/protease/amylase  72,000 Units Oral TID WC  . pantoprazole (PROTONIX) IV  40 mg Intravenous Q24H  . pregabalin  75 mg Oral BID  . rosuvastatin  20 mg Oral Daily  . zolpidem  5 mg Oral QHS   Continuous Infusions: . promethazine (PHENERGAN) injection (IM or IVPB) 12.5 mg (05/20/20 0420)     Anti-infectives (From admission, onward)   Start     Dose/Rate Route Frequency Ordered Stop   05/19/20 1000  doxycycline (VIBRA-TABS) tablet 100 mg        100 mg Oral Every 12 hours 05/19/20 0810 05/20/20 1016   05/18/20 1600  cefTRIAXone (ROCEPHIN) 1 g in sodium chloride 0.9 % 100 mL IVPB  Status:  Discontinued        1 g 200 mL/hr over 30 Minutes Intravenous Every 24 hours 05/17/20 2310 05/19/20 0810   05/17/20 1545  cefTRIAXone (ROCEPHIN) 1 g in sodium chloride 0.9 % 100 mL IVPB        1 g  200 mL/hr over 30 Minutes Intravenous  Once 05/17/20 1544 05/17/20 1645             Family Communication/Anticipated D/C date and plan/Code Status   DVT prophylaxis: SCDs Start: 05/17/20 2253     Code Status: Full Code  Family Communication: None Disposition Plan:    Status is: Inpatient  Remains inpatient appropriate because:Inpatient level of care appropriate due to severity of illness   Dispo: The patient is from: Home              Anticipated d/c is to: Home              Patient currently is not medically stable to d/c.   Difficult to place patient No               Subjective:   Interval events noted.  She vomited once yesterday afternoon.  She still has abdominal pain but it's a  little better.   Objective:    Vitals:   05/20/20 0500 05/20/20 0738 05/20/20 1114 05/20/20 1540  BP:  (!) 154/76 121/68 132/71  Pulse:  75 77 82  Resp:  18 16 16   Temp:  98.7 F (37.1 C) 98.8 F (37.1 C) 98.2 F (36.8 C)  TempSrc:  Oral    SpO2:  100% 100% 92%  Weight: 57 kg     Height:       No data found.   Intake/Output Summary (Last 24 hours) at 05/20/2020 1603 Last data filed at 05/20/2020 1402 Gross per 24 hour  Intake 660 ml  Output --  Net 660 ml   Filed Weights   05/17/20 1504 05/19/20 0500 05/20/20 0500  Weight: 59 kg 57 kg 57 kg    Exam:  GEN: NAD SKIN: Warm and dry EYES: EOMI ENT: MMM CV: RRR PULM: CTA B ABD: soft, ND, mild upper abdominal tenderness, +BS CNS: AAO x 3, non focal EXT: No edema or tenderness         Data Reviewed:   I have personally reviewed following labs and imaging studies:  Labs: Labs show the following:   Basic Metabolic Panel: Recent Labs  Lab 05/15/20 1044 05/17/20 1541 05/18/20 0305 05/19/20 0420  NA 133* 130* 138 141  K 4.0 3.8 3.7 3.5  CL 101 95* 106 105  CO2 20* 23 26 26   GLUCOSE 291* 362* 232* 174*  BUN 25* 16 14 13   CREATININE 1.50* 1.52* 1.18* 1.20*  CALCIUM 9.1 8.9 8.5* 8.5*  MG 2.0 2.0 2.0 1.9  PHOS  --   --  2.7 2.8   GFR Estimated Creatinine Clearance: 36.2 mL/min (A) (by C-G formula based on SCr of 1.2 mg/dL (H)). Liver Function Tests: Recent Labs  Lab 05/15/20 1044 05/17/20 1541 05/18/20 0305  AST 28 35 22  ALT 31 29 23   ALKPHOS 224* 196* 157*  BILITOT 0.8 0.7 0.4  PROT 8.1 8.1 6.7  ALBUMIN 4.0 4.2 3.3*   Recent Labs  Lab 05/15/20 1044 05/17/20 1541 05/18/20 0305  LIPASE 122* 165* 64*   Recent Labs  Lab 05/15/20 1059  AMMONIA 28   Coagulation profile No results for input(s): INR, PROTIME in the last 168 hours.  CBC: Recent Labs  Lab 05/15/20 1044 05/17/20 1541 05/18/20 0305  WBC 9.0 11.3* 10.0  NEUTROABS 5.8 8.0*  --   HGB 12.3 13.0 10.9*  HCT 35.8* 36.1  30.1*  MCV 84.2 82.0 81.6  PLT 255 270 251   Cardiac Enzymes: No results  for input(s): CKTOTAL, CKMB, CKMBINDEX, TROPONINI in the last 168 hours. BNP (last 3 results) No results for input(s): PROBNP in the last 8760 hours. CBG: Recent Labs  Lab 05/19/20 1623 05/19/20 2103 05/20/20 0731 05/20/20 1149 05/20/20 1236  GLUCAP 174* 268* 321* 210* 231*   D-Dimer: No results for input(s): DDIMER in the last 72 hours. Hgb A1c: No results for input(s): HGBA1C in the last 72 hours. Lipid Profile: No results for input(s): CHOL, HDL, LDLCALC, TRIG, CHOLHDL, LDLDIRECT in the last 72 hours. Thyroid function studies: Recent Labs    05/18/20 0305  TSH 3.657   Anemia work up: No results for input(s): VITAMINB12, FOLATE, FERRITIN, TIBC, IRON, RETICCTPCT in the last 72 hours. Sepsis Labs: Recent Labs  Lab 05/15/20 1044 05/17/20 1541 05/18/20 0305  WBC 9.0 11.3* 10.0    Microbiology Recent Results (from the past 240 hour(s))  Urine Culture     Status: Abnormal   Collection Time: 05/15/20  3:42 PM   Specimen: Urine, Random  Result Value Ref Range Status   Specimen Description   Final    URINE, RANDOM Performed at [facility], [address] [place], [place] [address]    Special Requests   Final    NONE Performed at [facility], [place], [place] [address]    Culture >=100,000 COLONIES/mL STAPHYLOCOCCUS LUGDUNENSIS (A)  Final   Report Status 05/18/2020 FINAL  Final   Organism ID, Bacteria STAPHYLOCOCCUS LUGDUNENSIS (A)  Final      Susceptibility   Staphylococcus lugdunensis - MIC*    CIPROFLOXACIN <=0.5 SENSITIVE Sensitive     GENTAMICIN <=0.5 SENSITIVE Sensitive     NITROFURANTOIN <=16 SENSITIVE Sensitive     OXACILLIN 2 SENSITIVE Sensitive     TETRACYCLINE <=1 SENSITIVE Sensitive     VANCOMYCIN <=0.5 SENSITIVE Sensitive     TRIMETH/SULFA <=10 SENSITIVE Sensitive     CLINDAMYCIN <=0.25 SENSITIVE Sensitive     RIFAMPIN <=0.5 SENSITIVE  Sensitive     Inducible Clindamycin NEGATIVE Sensitive     * >=100,000 COLONIES/mL STAPHYLOCOCCUS LUGDUNENSIS  Resp Panel by RT-PCR (Flu A&B, Covid) Nasopharyngeal Swab     Status: None   Collection Time: 05/17/20  4:22 PM   Specimen: Nasopharyngeal Swab; Nasopharyngeal(NP) swabs in vial transport medium  Result Value Ref Range Status   SARS Coronavirus 2 by RT PCR NEGATIVE NEGATIVE Final    Comment: (NOTE) SARS-CoV-2 target nucleic acids are NOT DETECTED.  The SARS-CoV-2 RNA is generally detectable in upper respiratory specimens during the acute phase of infection. The lowest concentration of SARS-CoV-2 viral copies this assay can detect is 138 copies/mL. A negative result does not preclude SARS-Cov-2 infection and should not be used as the sole basis for treatment or other patient management decisions. A negative result may occur with  improper specimen collection/handling, submission of specimen other than nasopharyngeal swab, presence of viral mutation(s) within the areas targeted by this assay, and inadequate number of viral copies(<138 copies/mL). A negative result must be combined with clinical observations, patient history, and epidemiological information. The expected result is Negative.  Fact Sheet for Patients:  EntrepreneurPulse.com.au  Fact Sheet for Healthcare Providers:  IncredibleEmployment.be  This test is no t yet approved or cleared by the [place] FDA and  has been authorized for detection and/or diagnosis of SARS-CoV-2 by FDA under an Emergency Use Authorization (EUA). This EUA will remain  in effect (meaning this test can be used) for the duration of the COVID-19 declaration under Section  564(b)(1) of the Act, 21 U.S.C.section 360bbb-3(b)(1), unless the authorization is terminated  or revoked sooner.       Influenza A by PCR NEGATIVE NEGATIVE Final   Influenza B by PCR NEGATIVE NEGATIVE Final    Comment:  (NOTE) The Xpert Xpress SARS-CoV-2/FLU/RSV plus assay is intended as an aid in the diagnosis of influenza from Nasopharyngeal swab specimens and should not be used as a sole basis for treatment. Nasal washings and aspirates are unacceptable for Xpert Xpress SARS-CoV-2/FLU/RSV testing.  Fact Sheet for Patients: EntrepreneurPulse.com.au  Fact Sheet for Healthcare Providers: IncredibleEmployment.be  This test is not yet approved or cleared by the [place] FDA and has been authorized for detection and/or diagnosis of SARS-CoV-2 by FDA under an Emergency Use Authorization (EUA). This EUA will remain in effect (meaning this test can be used) for the duration of the COVID-19 declaration under Section 564(b)(1) of the Act, 21 U.S.C. section 360bbb-3(b)(1), unless the authorization is terminated or revoked.  Performed at [facility], [address] [place], [place] [address]   Culture, blood (Routine X 2) w Reflex to ID Panel     Status: None (Preliminary result)   Collection Time: 05/18/20  3:05 AM   Specimen: BLOOD  Result Value Ref Range Status   Specimen Description BLOOD RIGHT ANTECUBITAL  Final   Special Requests   Final    BOTTLES DRAWN AEROBIC AND ANAEROBIC Blood Culture adequate volume   Culture   Final    NO GROWTH 2 DAYS Performed at [facility], [address], [place], [place] [address]    Report Status PENDING  Incomplete  Culture, blood (Routine X 2) w Reflex to ID Panel     Status: None (Preliminary result)   Collection Time: 05/18/20  3:06 AM   Specimen: BLOOD  Result Value Ref Range Status   Specimen Description BLOOD RIGHT HAND  Final   Special Requests   Final    BOTTLES DRAWN AEROBIC AND ANAEROBIC Blood Culture adequate volume   Culture   Final    NO GROWTH 2 DAYS Performed at [facility], [address] [place], [place] [address]    Report Status PENDING  Incomplete    Procedures  and diagnostic studies:  No results found.             LOS: 2 days   Yarelly Kuba  [facility]   Pager on [email or web address]. If 7PM-7AM, please contact night-coverage at [email or web address]     05/20/2020, 4:03 PM

## 2020-05-20 NOTE — Progress Notes (Addendum)
Inpatient Diabetes Program Recommendations  AACE/ADA: New Consensus Statement on Inpatient Glycemic Control (2015)  Target Ranges:  Prepandial:   less than 140 mg/dL      Peak postprandial:   less than 180 mg/dL (1-2 hours)      Critically ill patients:  140 - 180 mg/dL   Lab Results  Component Value Date   GLUCAP 321 (H) 05/20/2020   HGBA1C 11.9 (H) 11/16/2019    Review of Glycemic Control Results for Karen [name], Karen [name] (MRN [id number]) as of 05/20/2020 11:30  Ref. Range 05/19/2020 11:25 05/19/2020 15:38 05/19/2020 16:23 05/19/2020 21:03 05/20/2020 07:31  Glucose-Capillary Latest Ref Range: 70 - 99 mg/dL 152 (H) 69 (L) 174 (H) 268 (H) 321 (H)   Diabetes history: DM 2 Outpatient Diabetes medications:  Novolog 2 units tid with meals, Lantus 20 units q HS Current orders for Inpatient glycemic control:  Novolog sensitive tid with meals Lantus 10 units x1 Lantus 25 units q HS Novolog 7 units tid with meals  Inpatient Diabetes Program Recommendations:    Recommend reducing Lantus to 8 units q 12 hours.  Also consider reducing Novolog meal coverage to 2 units tid with meals (hold if patient eats less than 50% or NPO).   Thanks,  Adah Perl, RN, BC-ADM Inpatient Diabetes Coordinator Pager [phone] (8a-5p)

## 2020-05-20 NOTE — Progress Notes (Signed)
   05/20/20 1620  Therapy Vitals  Patient Position (if appropriate) Orthostatic Vitals  Orthostatic Lying   BP- Lying 124/69  Pulse- Lying 87  Orthostatic Sitting  BP- Sitting 116/75  Pulse- Sitting 90  Orthostatic Standing at 0 minutes  BP- Standing at 0 minutes 115/66  Pulse- Standing at 0 minutes 95  Orthostatic Standing at 3 minutes  BP- Standing at 3 minutes 110/71  Pulse- Standing at 3 minutes 95    Orthostatic vitals assessed during physical therapy, pt dizzy while attempting to AMB, dizzy in standing, reports dizziness resolves when supine in bed during testing.   4:23 PM, 05/20/20 Etta Grandchild, PT, DPT Physical Therapist - [facility]  [phone] [facility])

## 2020-05-20 NOTE — Evaluation (Signed)
Physical Therapy Evaluation Patient Details Name: Karen [name] MRN: [id number] DOB: 1955-12-19 Today's Date: 05/20/2020   History of Present Illness  Karen [name] is a 76yoF who comes to ED 5/11 c ABD pain sent on with ABX for UTI, then back 5/13 with ABD pain, N/V/D, HA, fatigue. PMH: ETOH abuse, ETOH pancreatitis, depression, DM, atypical migraine HA c hemiplegia, recurrent cystitis, and DKA.  Clinical Impression  Pt admitted with above diagnosis. Pt currently with functional limitations due to the deficits listed below (see "PT Problem List"). Upon entry, pt in bed, awake and agreeable to participate. Pt is familiar to author from othe rprio admissions. The pt is alert, pleasant, interactive, generally delayed in verbal response time and slow in words (typical of her baseline) and really able to provide only basic info regarding prior level of function- pt describes wide variation in independence, insidious in nature and lacking of any meaningful temporal component. Pt appears generally weak, but is able to perform all basic mobility without any physical assist, however pt reports high levels of concerning dizziness whilst up/AMB, and distance is limited to less than 175ft. Orthostatics are only slightly abnormal, but do not meet threshold for clinical positive status. Patient's performance this date reveals decreased ability, independence, and tolerance in performing all basic mobility required for performance of activities of daily living. Pt requires additional DME, close physical assistance, and cues for safe participate in mobility. Pt will benefit from skilled PT intervention to increase independence and safety with basic mobility in preparation for discharge to the venue listed below.       Follow Up Recommendations Outpatient PT;Supervision for mobility/OOB    Equipment Recommendations  None recommended by PT    Recommendations for Other Services       Precautions / Restrictions  Precautions Precautions: Fall Restrictions Weight Bearing Restrictions: No      Mobility  Bed Mobility Overal bed mobility: Modified Independent                  Transfers Overall transfer level: Modified independent Equipment used: Rolling walker (2 wheeled) Transfers: Sit to/from Stand Sit to Stand: Modified independent (Device/Increase time)         General transfer comment: labored effort, weak appearing, but no assist needed  Ambulation/Gait Ambulation/Gait assistance: Min guard Gait Distance (Feet): 70 Feet Assistive device: Rolling walker (2 wheeled)   Gait velocity: 0.66m/s   General Gait Details: segmented efforts, pauseing to grab rail in hallway due to dizziness, cues to use RW. able to return to room and sit EOB prior to orthostatic evaluation.  Stairs            Wheelchair Mobility    Modified Rankin (Stroke Patients Only)       Balance Overall balance assessment: Mild deficits observed, not formally tested;Modified Independent                                           Pertinent Vitals/Pain Pain Assessment: 0-10 Pain Score: 6  Pain Location: ABD pain; also a little dizzy. Pain Descriptors / Indicators: Aching Pain Intervention(s): Limited activity within patient's tolerance    Home Living Family/patient expects to be discharged to:: Private residence Living Arrangements: Spouse/significant other (on disability, home 24/7.) Available Help at Discharge: Family Type of Home: House Home Access: Stairs to enter Entrance Stairs-Rails: None Entrance Stairs-Number of Steps: 2 Home Layout: One  level Home Equipment: Cane - single point;Grab bars - tub/shower;Walker - 2 wheels Additional Comments: Uses O2 at home 2L/min day/night    Prior Function Level of Independence: Needs assistance   Gait / Transfers Assistance Needed: Mostly household AMB, will go to [employer] 1-2x weekly; takes O2 with her. sometimes uses a SPC  for household or community trips (sometimes uses RW)  ADL's / Homemaking Assistance Needed: independent with ADL but maybe 2x mnths will need hekp from husband.        Hand Dominance   Dominant Hand: Right    Extremity/Trunk Assessment   Upper Extremity Assessment Upper Extremity Assessment: Generalized weakness    Lower Extremity Assessment Lower Extremity Assessment: Generalized weakness       Communication   Communication: No difficulties  Cognition Arousal/Alertness: Awake/alert Behavior During Therapy: WFL for tasks assessed/performed Overall Cognitive Status: History of cognitive impairments - at baseline                                        General Comments      Exercises     Assessment/Plan    PT Assessment Patient needs continued PT services  PT Problem List Decreased strength;Decreased activity tolerance;Decreased balance;Decreased mobility;Cardiopulmonary status limiting activity       PT Treatment Interventions DME instruction;Balance training;Gait training;Stair training;Functional mobility training;Therapeutic activities;Therapeutic exercise;Patient/family education    PT Goals (Current goals can be found in the Care Plan section)  Acute Rehab PT Goals Patient Stated Goal: improve ABD pain, remain independent with AMB PT Goal Formulation: With patient Time For Goal Achievement: 06/03/20 Potential to Achieve Goals: Fair    Frequency Min 2X/week   Barriers to discharge        Co-evaluation               AM-PAC PT "6 Clicks" Mobility  Outcome Measure Help needed turning from your back to your side while in a flat bed without using bedrails?: None Help needed moving from lying on your back to sitting on the side of a flat bed without using bedrails?: None Help needed moving to and from a bed to a chair (including a wheelchair)?: A Little Help needed standing up from a chair using your arms (e.g., wheelchair or bedside  chair)?: A Little Help needed to walk in hospital room?: A Lot Help needed climbing 3-5 steps with a railing? : A Lot 6 Click Score: 18    End of Session Equipment Utilized During Treatment: Gait belt Activity Tolerance: Treatment limited secondary to medical complications (Comment) Patient left: in bed;with call bell/phone within reach;with bed alarm set Nurse Communication: Mobility status PT Visit Diagnosis: Difficulty in walking, not elsewhere classified (R26.2);Unsteadiness on feet (R26.81);Other symptoms and signs involving the nervous system (R29.898);Dizziness and giddiness (R42)    Time: 1610-9604 PT Time Calculation (min) (ACUTE ONLY): 27 min   Charges:   PT Evaluation $PT Eval Moderate Complexity: 1 Mod PT Treatments $Therapeutic Exercise: 8-22 mins        4:32 PM, 05/20/20 Etta Grandchild, PT, DPT Physical Therapist - [facility]  [phone] ([address])    [place] 05/20/2020, 4:29 PM

## 2020-05-20 NOTE — TOC Initial Note (Signed)
Transition of Care [facility]) - Initial/Assessment Note    Patient Details  Name: Karen [name] MRN: [id number] Date of Birth: 1955-10-23  Transition of Care [facility]) CM/SW Contact:    Candie Chroman, LCSW Phone Number: 05/20/2020, 10:47 AM  Clinical Narrative: Readmission prevention screen complete. CSW met with patient. Significant other at bedside. CSW introduced role and explained that discharge planning would be discussed. PCP is Clayborn Bigness, MD. Patient's boyfriend drives her to appointments. She uses [facility] and has no issues obtaining prescriptions. She stated they also mail her medications if needed. No home health prior to admission. Patient has a walker and cane that she uses as needed. She also has a stand to help her get up out of the shower. Patient 2 L of oxygen at home. Cannot remember name of oxygen company. No further concerns. CSW encouraged patient and her boyfriend to contact CSW as needed. CSW will continue to follow patient and her significant other for support and facilitate return home when stable.      Expected Discharge Plan: Home/Self Care Barriers to Discharge: Continued Medical Work up   Patient Goals and CMS Choice        Expected Discharge Plan and Services Expected Discharge Plan: Home/Self Care       Living arrangements for the past 2 months: Single Family Home                                      Prior Living Arrangements/Services Living arrangements for the past 2 months: Single Family Home   Patient language and need for interpreter reviewed:: Yes Do you feel safe going back to the place where you live?: Yes      Need for Family Participation in Patient Care: Yes (Comment) Care giver support system in place?: Yes (comment) Current home services: DME Criminal Activity/Legal Involvement Pertinent to Current Situation/Hospitalization: No - Comment as needed  Activities of Daily Living Home Assistive Devices/Equipment: None ADL Screening  (condition at time of admission) Patient's cognitive ability adequate to safely complete daily activities?: Yes Is the patient deaf or have difficulty hearing?: No Does the patient have difficulty seeing, even when wearing glasses/contacts?: No Does the patient have difficulty concentrating, remembering, or making decisions?: No Patient able to express need for assistance with ADLs?: Yes Does the patient have difficulty dressing or bathing?: No Independently performs ADLs?: Yes (appropriate for developmental age) Does the patient have difficulty walking or climbing stairs?: No Weakness of Legs: Both Weakness of Arms/Hands: None  Permission Sought/Granted Permission sought to share information with : Family Supports    Share Information with NAME: Barth Kirks     Permission granted to share info w Relationship: Significant Other  Permission granted to share info w Contact Information: [phone]  Emotional Assessment Appearance:: Appears stated age Attitude/Demeanor/Rapport: Engaged,Gracious Affect (typically observed): Accepting,Appropriate,Calm,Pleasant Orientation: : Oriented to Self,Oriented to Place,Oriented to  Time,Oriented to Situation Alcohol / Substance Use: Not Applicable Psych Involvement: No (comment)  Admission diagnosis:  Intractable nausea and vomiting [R11.2] Intractable vomiting with nausea, unspecified vomiting type [R11.2] Acute pancreatitis, unspecified complication status, unspecified pancreatitis type [K85.90] Patient Active Problem List   Diagnosis Date Noted  . Epigastric pain 05/18/2020  . Acute cystitis 05/18/2020  . Intractable nausea and vomiting 05/17/2020  . Stroke-like symptom 11/15/2019  . Itching   . Diabetic polyneuropathy associated with type 2 diabetes mellitus ([place])   . Leukocytosis  06/13/2019  . Chronic kidney disease (CKD), stage II (mild)   . CKD (chronic kidney disease), stage IIIa 03/15/2019  . HTN (hypertension) 03/15/2019  .  Type II diabetes mellitus with renal manifestations ([place]) 03/15/2019  . Headache 02/26/2019  . Dizziness 02/26/2019  . Hyperglycemia due to type 2 diabetes mellitus ([place]) 01/30/2019  . Acute pancreatitis 01/30/2019  . Constipation   . Acute on chronic pancreatitis ([place]) 01/25/2019  . Acute pain of left shoulder   . Adhesive capsulitis of left shoulder   . Abnormal LFTs 01/24/2019  . Cough 01/24/2019  . Hypertension   . HLD (hyperlipidemia)   . Type 2 diabetes mellitus with hyperlipidemia ([place])   . GERD (gastroesophageal reflux disease)   . Acute kidney injury superimposed on CKD ([place])   . Pancreatitis, recurrent   . Chronic pancreatitis ([place]) 09/25/2018  . 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 Coffee ground emesis   . Upper GI bleed 11/11/2017  . Pancreatitis, acute 01/03/2017  . ETOH abuse 01/03/2017  . UTI (urinary tract infection) 01/03/2017  . Nausea and vomiting   . Esophageal candidiasis ([place])   . Intractable vomiting with nausea   . Hypertensive urgency 04/21/2016  . Abdominal pain   . Nausea vomiting and diarrhea   . Smoker   . Poorly controlled type 2 diabetes mellitus ([place])   . Acute renal insufficiency 10/29/2015  . Elevated troponin 10/29/2015  . Urinary tract infection without hematuria 10/29/2015  . Left flank pain 10/29/2015  . Malignant essential hypertension 10/29/2015  . DKA (diabetic ketoacidoses) 02/17/2015  . Emphysematous cystitis    PCP:  Lavera Guise, MD Pharmacy:   [place], [place] [address] Phone: [phone] Fax: [phone]     Social Determinants of Health (SDOH) Interventions    Readmission Risk Interventions Readmission Risk Prevention Plan 05/20/2020 08/04/2019 06/15/2019  Transportation Screening Complete Complete -  PCP or Specialist Appt within 3-5 Days Complete Complete -  Social Work Consult for [place] Planning/Counseling Complete Complete -  Palliative Care  Screening Not Applicable Not Applicable Not Applicable  Medication Review Press photographer) Complete Complete Complete  Some recent data might be hidden

## 2020-05-21 LAB — GLUCOSE, CAPILLARY
Glucose-Capillary: 191 mg/dL — ABNORMAL HIGH (ref 70–99)
Glucose-Capillary: 254 mg/dL — ABNORMAL HIGH (ref 70–99)
Glucose-Capillary: 205 mg/dL — ABNORMAL HIGH (ref 70–99)
Glucose-Capillary: 109 mg/dL — ABNORMAL HIGH (ref 70–99)
Glucose-Capillary: 220 mg/dL — ABNORMAL HIGH (ref 70–99)

## 2020-05-21 MED ORDER — INSULIN GLARGINE 100 UNIT/ML ~~LOC~~ SOLN
15.0000 [IU] | Freq: Every day | SUBCUTANEOUS | Status: DC
  Administered 2020-05-21 – 2020-05-23 (×3): 15 [IU] via SUBCUTANEOUS
  Filled 2020-05-21 (×4): qty 0.15

## 2020-05-21 MED ORDER — INSULIN ASPART 100 UNIT/ML IJ SOLN
3.0000 [IU] | Freq: Three times a day (TID) | INTRAMUSCULAR | Status: DC
  Administered 2020-05-21 – 2020-05-24 (×8): 3 [IU] via SUBCUTANEOUS
  Filled 2020-05-21 (×9): qty 1

## 2020-05-21 NOTE — Plan of Care (Signed)

## 2020-05-21 NOTE — Progress Notes (Addendum)
Inpatient Diabetes Program Recommendations  AACE/ADA: New Consensus Statement on Inpatient Glycemic Control (2015)  Target Ranges:  Prepandial:   less than 140 mg/dL      Peak postprandial:   less than 180 mg/dL (1-2 hours)      Critically ill patients:  140 - 180 mg/dL   Lab Results  Component Value Date   GLUCAP 191 (H) 05/21/2020   HGBA1C 10.1 (H) 05/20/2020    Review of Glycemic Control Results for BEYLA, LONEY (MRN [id number]) as of 05/21/2020 07:36  Ref. Range 05/20/2020 20:28 05/20/2020 23:30 05/21/2020 04:58  Glucose-Capillary Latest Ref Range: 70 - 99 mg/dL 496 (H) 393 (H) 191 (H)   Diabetes history: DM  Outpatient Diabetes medications:  Novolog 2 units tid with meals, Lantus 20 units q HS Current orders for Inpatient glycemic control:  Novolog sensitive tid with meals Lantus 8 units bid Inpatient Diabetes Program Recommendations:   Please consider adding Novolog 3 units tid with meals (hold if patient eats less than 50% or NPO). Note that A1C indicates average CBG's of 243 mg/dL (10.1%).  This is an improvement from November 2021.  Will follow.  Please order glucose meter #[id number] for home.  Patient states that she is interested in "Continuous glucose monitor" but states that her insurance would not cover it. Encouraged her to call her endocrinologist to inquire about CGM and paperwork that needs to be completed by medicaid.  Patient verbalized understanding.  We reviewed glucose goals importance of taking insulin as ordered.  Encouraged close f/u with Dr. Honor Junes.   Thanks,  Adah Perl, RN, BC-ADM Inpatient Diabetes Coordinator Pager [phone] (8a-5p)

## 2020-05-21 NOTE — Progress Notes (Signed)
PT Cancellation Note  Patient Details Name: Karen [name] MRN: [id number] DOB: 1955/01/14   Cancelled Treatment:    Reason Eval/Treat Not Completed: Other (comment) Pt had just recently walked ~100 ft with mobility tech, some dizziness.  Nurse was working with her IV, deferred further activity at this time.  Will maintain on caseload and continue to follow/treat as appropriate.   Kreg Shropshire, DPT 05/21/2020, 5:51 PM

## 2020-05-21 NOTE — Progress Notes (Signed)
Mobility Specialist - Progress Note   05/21/20 1700  Mobility  Activity Ambulated in hall  Level of Assistance Standby assist, set-up cues, supervision of patient - no hands on  Assistive Device Front wheel walker  Distance Ambulated (ft) 100 ft  Mobility Ambulated with assistance in hallway  Mobility Response Tolerated well  Mobility performed by Mobility specialist  $Mobility charge 1 Mobility    Pre-mobility: 86 HR, 95% SpO2 During mobility: 87 HR, 98% SpO2 Post-mobility: 87 HR, 98% SpO2   Pt ambulated in hallway with RW. No dizziness reported upon sitting/standing, but does develop during ambulation "2/10". Mild migraine. Mild SOB on RA, O2 maintained high 90s. RPE 2/10 with 'turning around' reported as most difficult part of session as it increases dizziness with head-turns.    Kathee Delton Mobility Specialist 05/21/20, 5:46 PM

## 2020-05-21 NOTE — TOC Progression Note (Signed)
Transition of Care [facility]) - Progression Note    Patient Details  Name: Karen [name] MRN: [id number] Date of Birth: October 27, 1955  Transition of Care [facility]) CM/SW [place], LCSW Phone Number: 05/21/2020, 1:32 PM  Clinical Narrative:  PT recommending outpatient therapy. Patient stated she has been to outpatient therapy at the [facility] on [address] but wants to think about if she wants these services again. CSW provided list of local clinics and will follow up tomorrow with decision.   Expected Discharge Plan: Home/Self Care Barriers to Discharge: Continued Medical Work up  Expected Discharge Plan and Services Expected Discharge Plan: Home/Self Care       Living arrangements for the past 2 months: Single Family Home                                       Social Determinants of Health (SDOH) Interventions    Readmission Risk Interventions Readmission Risk Prevention Plan 05/20/2020 08/04/2019 06/15/2019  Transportation Screening Complete Complete -  PCP or Specialist Appt within 3-5 Days Complete Complete -  Social Work Consult for [place] Planning/Counseling Complete Complete -  Palliative Care Screening Not Applicable Not Applicable Not Applicable  Medication Review Press photographer) Complete Complete Complete  Some recent data might be hidden

## 2020-05-21 NOTE — Progress Notes (Addendum)
Progress Note    Karen [name]  WVP:[id number] DOB: [birth year]  DOA: 05/17/2020 PCP: Lavera Guise, MD      Brief Narrative:    Medical records reviewed and are as summarized below:  Karen [name] is a 65 y.o. female with medical history significant for poorly controlled type II DM (recent hemoglobin A1c 11.3 in March 2022), chronic abdominal pain, recurrent UTI, hypertension, hyperlipidemia, GERD, stage IIIb CKD, chronically elevated alkaline phosphatase, recent diagnosis of UTI in the emergency room on 05/15/2020.  She presented to the hospital because of nausea, vomiting and abdominal pain.  She was admitted to the hospital for acute pancreatitis and acute UTI.  She was treated with bowel rest, IV fluids, analgesics and antiemetics. She developed hypoglycemia and hyperglycemia in the hospital.  Glucose levels were erratic and difficult to control.    Assessment/Plan:   Principal Problem:   Intractable nausea and vomiting Active Problems:   Poorly controlled type 2 diabetes mellitus (HCC)   Pancreatitis, acute   Hypertension   HLD (hyperlipidemia)   GERD (gastroesophageal reflux disease)   CKD (chronic kidney disease), stage IIIa   Epigastric pain   Acute cystitis    Body mass index is 21.62 kg/m.   Acute pancreatitis: Analgesics as needed for pain.   Acute UTI: Completed antibiotics.  Urine culture from 05/15/2020 showed Staphylococcus lugdunensis.  It is not clear whether this is part of normal flora.  CKD stage IIIb: Creatinine is trending up.  Continue to monitor.  IDDM with hyperglycemia and hypoglycemia: Change insulin regimen.  Lantus 15 units nightly and NovoLog 3 units with meals.  Continue correctional scale insulin regimen.  Hypertension, hypertensive urgency: Continue amlodipine.  Hold HCTZ because of elevated creatinine.  She says her BP is usually high when she is sick in the hospital.  History of alcohol use disorder: She said she has not drank  alcohol for 7 years.  Other comorbidities include hyperlipidemia, depression, GERD  Plan to discharge home tomorrow when glucose levels have stabilized.   Diet Order            Diet Carb Modified Fluid consistency: Thin; Room service appropriate? Yes  Diet effective now                    Consultants:  None  Procedures:  None    Medications:   . amLODipine  10 mg Oral Daily  . insulin aspart  0-5 Units Subcutaneous QHS  . insulin aspart  0-9 Units Subcutaneous TID WC  . insulin aspart  3 Units Subcutaneous TID WC  . insulin glargine  15 Units Subcutaneous QHS  . lipase/protease/amylase  72,000 Units Oral TID WC  . pantoprazole (PROTONIX) IV  40 mg Intravenous Q24H  . pregabalin  75 mg Oral BID  . rosuvastatin  20 mg Oral Daily  . zolpidem  5 mg Oral QHS   Continuous Infusions: . promethazine (PHENERGAN) injection (IM or IVPB) 12.5 mg (05/20/20 0420)     Anti-infectives (From admission, onward)   Start     Dose/Rate Route Frequency Ordered Stop   05/19/20 1000  doxycycline (VIBRA-TABS) tablet 100 mg        100 mg Oral Every 12 hours 05/19/20 0810 05/20/20 1016   05/18/20 1600  cefTRIAXone (ROCEPHIN) 1 g in sodium chloride 0.9 % 100 mL IVPB  Status:  Discontinued        1 g 200 mL/hr over 30 Minutes Intravenous Every 24 hours  05/17/20 2310 05/19/20 0810   05/17/20 1545  cefTRIAXone (ROCEPHIN) 1 g in sodium chloride 0.9 % 100 mL IVPB        1 g 200 mL/hr over 30 Minutes Intravenous  Once 05/17/20 1544 05/17/20 1645             Family Communication/Anticipated D/C date and plan/Code Status   DVT prophylaxis: SCDs Start: 05/17/20 2253     Code Status: Full Code  Family Communication: None Disposition Plan:    Status is: Inpatient  Remains inpatient appropriate because:Inpatient level of care appropriate due to severity of illness   Dispo: The patient is from: Home              Anticipated d/c is to: Home              Patient currently is  not medically stable to d/c.   Difficult to place patient No               Subjective:   Interval events noted.  She developed hypoglycemia yesterday afternoon and hyperglycemia last night with glucose up to 510.  She complains of nausea and upper abdominal pain.  No vomiting or diarrhea.   Objective:    Vitals:   05/20/20 2023 05/21/20 0456 05/21/20 0500 05/21/20 0906  BP: 132/85 121/67  (!) 161/75  Pulse: 78 80  77  Resp: 18 14  18   Temp: 97.7 F (36.5 C) 98.2 F (36.8 C)    TempSrc: Oral Oral    SpO2: 97% 95%  100%  Weight:   51.9 kg   Height:       Orthostatic VS for the past 24 hrs:  BP- Lying Pulse- Lying BP- Sitting Pulse- Sitting BP- Standing at 0 minutes Pulse- Standing at 0 minutes  05/20/20 1620 124/69 87 116/75 90 115/66 95     Intake/Output Summary (Last 24 hours) at 05/21/2020 1437 Last data filed at 05/21/2020 1401 Gross per 24 hour  Intake 720 ml  Output --  Net 720 ml   Filed Weights   05/19/20 0500 05/20/20 0500 05/21/20 0500  Weight: 57 kg 57 kg 51.9 kg    Exam:  GEN: NAD SKIN: No rash EYES: EOMI ENT: MMM CV: RRR PULM: CTA B ABD: soft, ND, mild epigastric tenderness, +BS CNS: AAO x 3, non focal EXT: No edema or tenderness         Data Reviewed:   I have personally reviewed following labs and imaging studies:  Labs: Labs show the following:   Basic Metabolic Panel: Recent Labs  Lab 05/15/20 1044 05/17/20 1541 05/18/20 0305 05/19/20 0420 05/20/20 2121  NA 133* 130* 138 141 132*  K 4.0 3.8 3.7 3.5 4.4  CL 101 95* 106 105 97*  CO2 20* 23 26 26 26   GLUCOSE 291* 362* 232* 174* 510*  BUN 25* 16 14 13 22   CREATININE 1.50* 1.52* 1.18* 1.20* 1.80*  CALCIUM 9.1 8.9 8.5* 8.5* 8.1*  MG 2.0 2.0 2.0 1.9  --   PHOS  --   --  2.7 2.8  --    GFR Estimated Creatinine Clearance: 24.1 mL/min (A) (by C-G formula based on SCr of 1.8 mg/dL (H)). Liver Function Tests: Recent Labs  Lab 05/15/20 1044 05/17/20 1541  05/18/20 0305  AST 28 35 22  ALT 31 29 23   ALKPHOS 224* 196* 157*  BILITOT 0.8 0.7 0.4  PROT 8.1 8.1 6.7  ALBUMIN 4.0 4.2 3.3*   Recent Labs  Lab  05/15/20 1044 05/17/20 1541 05/18/20 0305  LIPASE 122* 165* 64*   Recent Labs  Lab 05/15/20 1059  AMMONIA 28   Coagulation profile No results for input(s): INR, PROTIME in the last 168 hours.  CBC: Recent Labs  Lab 05/15/20 1044 05/17/20 1541 05/18/20 0305  WBC 9.0 11.3* 10.0  NEUTROABS 5.8 8.0*  --   HGB 12.3 13.0 10.9*  HCT 35.8* 36.1 30.1*  MCV 84.2 82.0 81.6  PLT 255 270 251   Cardiac Enzymes: No results for input(s): CKTOTAL, CKMB, CKMBINDEX, TROPONINI in the last 168 hours. BNP (last 3 results) No results for input(s): PROBNP in the last 8760 hours. CBG: Recent Labs  Lab 05/20/20 2028 05/20/20 2330 05/21/20 0458 05/21/20 0759 05/21/20 1137  GLUCAP 496* 393* 191* 254* 205*   D-Dimer: No results for input(s): DDIMER in the last 72 hours. Hgb A1c: Recent Labs    05/20/20 2121  HGBA1C 10.1*   Lipid Profile: No results for input(s): CHOL, HDL, LDLCALC, TRIG, CHOLHDL, LDLDIRECT in the last 72 hours. Thyroid function studies: No results for input(s): TSH, T4TOTAL, T3FREE, THYROIDAB in the last 72 hours.  Invalid input(s): FREET3 Anemia work up: No results for input(s): VITAMINB12, FOLATE, FERRITIN, TIBC, IRON, RETICCTPCT in the last 72 hours. Sepsis Labs: Recent Labs  Lab 05/15/20 1044 05/17/20 1541 05/18/20 0305  WBC 9.0 11.3* 10.0    Microbiology Recent Results (from the past 240 hour(s))  Urine Culture     Status: Abnormal   Collection Time: 05/15/20  3:42 PM   Specimen: Urine, Random  Result Value Ref Range Status   Specimen Description   Final    URINE, RANDOM Performed at [facility], [address] [place], [place] [address]    Special Requests   Final    NONE Performed at [facility], [place], [place] [address]    Culture >=100,000  COLONIES/mL STAPHYLOCOCCUS LUGDUNENSIS (A)  Final   Report Status 05/18/2020 FINAL  Final   Organism ID, Bacteria STAPHYLOCOCCUS LUGDUNENSIS (A)  Final      Susceptibility   Staphylococcus lugdunensis - MIC*    CIPROFLOXACIN <=0.5 SENSITIVE Sensitive     GENTAMICIN <=0.5 SENSITIVE Sensitive     NITROFURANTOIN <=16 SENSITIVE Sensitive     OXACILLIN 2 SENSITIVE Sensitive     TETRACYCLINE <=1 SENSITIVE Sensitive     VANCOMYCIN <=0.5 SENSITIVE Sensitive     TRIMETH/SULFA <=10 SENSITIVE Sensitive     CLINDAMYCIN <=0.25 SENSITIVE Sensitive     RIFAMPIN <=0.5 SENSITIVE Sensitive     Inducible Clindamycin NEGATIVE Sensitive     * >=100,000 COLONIES/mL STAPHYLOCOCCUS LUGDUNENSIS  Resp Panel by RT-PCR (Flu A&B, Covid) Nasopharyngeal Swab     Status: None   Collection Time: 05/17/20  4:22 PM   Specimen: Nasopharyngeal Swab; Nasopharyngeal(NP) swabs in vial transport medium  Result Value Ref Range Status   SARS Coronavirus 2 by RT PCR NEGATIVE NEGATIVE Final    Comment: (NOTE) SARS-CoV-2 target nucleic acids are NOT DETECTED.  The SARS-CoV-2 RNA is generally detectable in upper respiratory specimens during the acute phase of infection. The lowest concentration of SARS-CoV-2 viral copies this assay can detect is 138 copies/mL. A negative result does not preclude SARS-Cov-2 infection and should not be used as the sole basis for treatment or other patient management decisions. A negative result may occur with  improper specimen collection/handling, submission of specimen other than nasopharyngeal swab, presence of viral mutation(s) within the areas targeted by this assay, and inadequate number  of viral copies(<138 copies/mL). A negative result must be combined with clinical observations, patient history, and epidemiological information. The expected result is Negative.  Fact Sheet for Patients:  EntrepreneurPulse.com.au  Fact Sheet for Healthcare Providers:   IncredibleEmployment.be  This test is no t yet approved or cleared by the [place] FDA and  has been authorized for detection and/or diagnosis of SARS-CoV-2 by FDA under an Emergency Use Authorization (EUA). This EUA will remain  in effect (meaning this test can be used) for the duration of the COVID-19 declaration under Section 564(b)(1) of the Act, 21 U.S.C.section 360bbb-3(b)(1), unless the authorization is terminated  or revoked sooner.       Influenza A by PCR NEGATIVE NEGATIVE Final   Influenza B by PCR NEGATIVE NEGATIVE Final    Comment: (NOTE) The Xpert Xpress SARS-CoV-2/FLU/RSV plus assay is intended as an aid in the diagnosis of influenza from Nasopharyngeal swab specimens and should not be used as a sole basis for treatment. Nasal washings and aspirates are unacceptable for Xpert Xpress SARS-CoV-2/FLU/RSV testing.  Fact Sheet for Patients: EntrepreneurPulse.com.au  Fact Sheet for Healthcare Providers: IncredibleEmployment.be  This test is not yet approved or cleared by the [place] FDA and has been authorized for detection and/or diagnosis of SARS-CoV-2 by FDA under an Emergency Use Authorization (EUA). This EUA will remain in effect (meaning this test can be used) for the duration of the COVID-19 declaration under Section 564(b)(1) of the Act, 21 U.S.C. section 360bbb-3(b)(1), unless the authorization is terminated or revoked.  Performed at [facility], [address] [place], [place] [address]   Culture, blood (Routine X 2) w Reflex to ID Panel     Status: None (Preliminary result)   Collection Time: 05/18/20  3:05 AM   Specimen: BLOOD  Result Value Ref Range Status   Specimen Description BLOOD RIGHT ANTECUBITAL  Final   Special Requests   Final    BOTTLES DRAWN AEROBIC AND ANAEROBIC Blood Culture adequate volume   Culture   Final    NO GROWTH 2 DAYS Performed at [facility], [address] [place], [place] [address]    Report Status PENDING  Incomplete  Culture, blood (Routine X 2) w Reflex to ID Panel     Status: None (Preliminary result)   Collection Time: 05/18/20  3:06 AM   Specimen: BLOOD  Result Value Ref Range Status   Specimen Description BLOOD RIGHT HAND  Final   Special Requests   Final    BOTTLES DRAWN AEROBIC AND ANAEROBIC Blood Culture adequate volume   Culture   Final    NO GROWTH 2 DAYS Performed at [facility], [address], [place], [place] [address]    Report Status PENDING  Incomplete    Procedures and diagnostic studies:  No results found.             LOS: 3 days   Gracilyn Gunia  [facility]   Pager on [email or web address]. If 7PM-7AM, please contact night-coverage at [email or web address]     05/21/2020, 2:37 PM

## 2020-05-21 NOTE — Progress Notes (Signed)
Per provider c-diff order is been d/c.

## 2020-05-22 DIAGNOSIS — K5900 Constipation, unspecified: Secondary | ICD-10-CM

## 2020-05-22 LAB — BASIC METABOLIC PANEL
Anion gap: 11 (ref 5–15)
BUN: 38 mg/dL — ABNORMAL HIGH (ref 8–23)
CO2: 25 mmol/L (ref 22–32)
Calcium: 8.8 mg/dL — ABNORMAL LOW (ref 8.9–10.3)
Chloride: 101 mmol/L (ref 98–111)
Creatinine, Ser: 1.67 mg/dL — ABNORMAL HIGH (ref 0.44–1.00)
GFR, Estimated: 34 mL/min — ABNORMAL LOW (ref >60)
Glucose, Bld: 214 mg/dL — ABNORMAL HIGH (ref 70–99)
Potassium: 3.9 mmol/L (ref 3.5–5.1)
Sodium: 137 mmol/L (ref 135–145)

## 2020-05-22 LAB — GLUCOSE, CAPILLARY
Glucose-Capillary: 213 mg/dL — ABNORMAL HIGH (ref 70–99)
Glucose-Capillary: 211 mg/dL — ABNORMAL HIGH (ref 70–99)
Glucose-Capillary: 150 mg/dL — ABNORMAL HIGH (ref 70–99)
Glucose-Capillary: 138 mg/dL — ABNORMAL HIGH (ref 70–99)
Glucose-Capillary: 173 mg/dL — ABNORMAL HIGH (ref 70–99)

## 2020-05-22 MED ORDER — POLYETHYLENE GLYCOL 3350 17 G PO PACK
17.0000 g | PACK | Freq: Every day | ORAL | Status: DC
  Administered 2020-05-22 – 2020-05-24 (×3): 17 g via ORAL
  Filled 2020-05-22 (×3): qty 1

## 2020-05-22 MED ORDER — HYDROCORTISONE 1 % EX CREA
TOPICAL_CREAM | Freq: Two times a day (BID) | CUTANEOUS | Status: DC
  Administered 2020-05-22: 1 via TOPICAL
  Filled 2020-05-22: qty 28

## 2020-05-22 MED ORDER — DOCUSATE SODIUM 100 MG PO CAPS
200.0000 mg | ORAL_CAPSULE | Freq: Two times a day (BID) | ORAL | Status: DC
  Administered 2020-05-22 – 2020-05-24 (×5): 200 mg via ORAL
  Filled 2020-05-22 (×5): qty 2

## 2020-05-22 NOTE — Progress Notes (Signed)
PROGRESS NOTE    Karen [name]  SAY:[id number] DOB: [birth year] DOA: 05/17/2020 PCP: Lavera Guise, MD   Assessment & Plan:   Principal Problem:   Intractable nausea and vomiting Active Problems:   Poorly controlled type 2 diabetes mellitus ([place])   Pancreatitis, acute   Hypertension   HLD (hyperlipidemia)   GERD (gastroesophageal reflux disease)   CKD (chronic kidney disease), stage IIIa   Epigastric pain   Acute cystitis   Acute pancreatitis: continue on creon.   Constipation: started on colace, miralax   Acute UTI: completed abx course.  Urine culture from 05/15/2020 showed Staphylococcus lugdunensis.    CKDIIIb: Cr is trending down from day prior. Avoid nephrotoxic meds   DM2: poorly controlled. Continue on lantus, SSI w/ accuchecks.   HTN: continue on amlodipine and continue to hold HCTZ secondary to rising Cr   HTN urgency: resolved   History of alcohol use disorder: has not drank alcohol for 7 years.    DVT prophylaxis: SCDs Code Status: full Family Communication: Disposition Plan: likely d/c back home   Level of care: Med-Surg   Status is: Inpatient  Remains inpatient appropriate because:IV treatments appropriate due to intensity of illness or inability to take PO and Inpatient level of care appropriate due to severity of illness   Dispo: The patient is from: Home              Anticipated d/c is to: Home              Patient currently is not medically stable to d/c.   Difficult to place patient Yes     Consultants:      Procedures:    Antimicrobials:    Subjective: Pt c/o constipation   Objective: Vitals:   05/22/20 0500 05/22/20 0509 05/22/20 0808 05/22/20 1130  BP:  (!) 116/57 127/76 140/68  Pulse:  72 72 69  Resp:  18 18 18   Temp:  98.1 F (36.7 C) 99 F (37.2 C) 98.7 F (37.1 C)  TempSrc:   Oral   SpO2:  95% 94% 100%  Weight: 54.8 kg     Height:        Intake/Output Summary (Last 24 hours) at 05/22/2020 1353 Last  data filed at 05/22/2020 1351 Gross per 24 hour  Intake 960 ml  Output --  Net 960 ml   Filed Weights   05/20/20 0500 05/21/20 0500 05/22/20 0500  Weight: 57 kg 51.9 kg 54.8 kg    Examination:  General exam: Appears calm but uncomfortable  Respiratory system: Clear to auscultation. Respiratory effort normal. Cardiovascular system: S1 & S2+. No  rubs, gallops or clicks.  Gastrointestinal system: Abdomen is distended, soft and tenderness to palpation. Hypoactive bowel sounds Central nervous system: Alert and oriented. Moves all extremities  Psychiatry: Judgement and insight appear normal. Flat mood and affect    Data Reviewed: I have personally reviewed following labs and imaging studies  CBC: Recent Labs  Lab 05/17/20 1541 05/18/20 0305  WBC 11.3* 10.0  NEUTROABS 8.0*  --   HGB 13.0 10.9*  HCT 36.1 30.1*  MCV 82.0 81.6  PLT 270 235   Basic Metabolic Panel: Recent Labs  Lab 05/17/20 1541 05/18/20 0305 05/19/20 0420 05/20/20 2121 05/22/20 0543  NA 130* 138 141 132* 137  K 3.8 3.7 3.5 4.4 3.9  CL 95* 106 105 97* 101  CO2 23 26 26 26 25   GLUCOSE 362* 232* 174* 510* 214*  BUN 16 14 13 22  38*  CREATININE 1.52* 1.18* 1.20* 1.80* 1.67*  CALCIUM 8.9 8.5* 8.5* 8.1* 8.8*  MG 2.0 2.0 1.9  --   --   PHOS  --  2.7 2.8  --   --    GFR: Estimated Creatinine Clearance: 26 mL/min (A) (by C-G formula based on SCr of 1.67 mg/dL (H)). Liver Function Tests: Recent Labs  Lab 05/17/20 1541 05/18/20 0305  AST 35 22  ALT 29 23  ALKPHOS 196* 157*  BILITOT 0.7 0.4  PROT 8.1 6.7  ALBUMIN 4.2 3.3*   Recent Labs  Lab 05/17/20 1541 05/18/20 0305  LIPASE 165* 64*   No results for input(s): AMMONIA in the last 168 hours. Coagulation Profile: No results for input(s): INR, PROTIME in the last 168 hours. Cardiac Enzymes: No results for input(s): CKTOTAL, CKMB, CKMBINDEX, TROPONINI in the last 168 hours. BNP (last 3 results) No results for input(s): PROBNP in the last 8760  hours. HbA1C: Recent Labs    05/20/20 2121  HGBA1C 10.1*   CBG: Recent Labs  Lab 05/21/20 1644 05/21/20 2046 05/22/20 0512 05/22/20 0736 05/22/20 1132  GLUCAP 109* 220* 213* 211* 150*   Lipid Profile: No results for input(s): CHOL, HDL, LDLCALC, TRIG, CHOLHDL, LDLDIRECT in the last 72 hours. Thyroid Function Tests: No results for input(s): TSH, T4TOTAL, FREET4, T3FREE, THYROIDAB in the last 72 hours. Anemia Panel: No results for input(s): VITAMINB12, FOLATE, FERRITIN, TIBC, IRON, RETICCTPCT in the last 72 hours. Sepsis Labs: No results for input(s): PROCALCITON, LATICACIDVEN in the last 168 hours.  Recent Results (from the past 240 hour(s))  Urine Culture     Status: Abnormal   Collection Time: 05/15/20  3:42 PM   Specimen: Urine, Random  Result Value Ref Range Status   Specimen Description   Final    URINE, RANDOM Performed at [facility], [address], [place], [place] [address]    Special Requests   Final    NONE Performed at [facility], [place], [place] [address]    Culture >=100,000 COLONIES/mL STAPHYLOCOCCUS LUGDUNENSIS (A)  Final   Report Status 05/18/2020 FINAL  Final   Organism ID, Bacteria STAPHYLOCOCCUS LUGDUNENSIS (A)  Final      Susceptibility   Staphylococcus lugdunensis - MIC*    CIPROFLOXACIN <=0.5 SENSITIVE Sensitive     GENTAMICIN <=0.5 SENSITIVE Sensitive     NITROFURANTOIN <=16 SENSITIVE Sensitive     OXACILLIN 2 SENSITIVE Sensitive     TETRACYCLINE <=1 SENSITIVE Sensitive     VANCOMYCIN <=0.5 SENSITIVE Sensitive     TRIMETH/SULFA <=10 SENSITIVE Sensitive     CLINDAMYCIN <=0.25 SENSITIVE Sensitive     RIFAMPIN <=0.5 SENSITIVE Sensitive     Inducible Clindamycin NEGATIVE Sensitive     * >=100,000 COLONIES/mL STAPHYLOCOCCUS LUGDUNENSIS  Resp Panel by RT-PCR (Flu A&B, Covid) Nasopharyngeal Swab     Status: None   Collection Time: 05/17/20  4:22 PM   Specimen: Nasopharyngeal Swab; Nasopharyngeal(NP)  swabs in vial transport medium  Result Value Ref Range Status   SARS Coronavirus 2 by RT PCR NEGATIVE NEGATIVE Final    Comment: (NOTE) SARS-CoV-2 target nucleic acids are NOT DETECTED.  The SARS-CoV-2 RNA is generally detectable in upper respiratory specimens during the acute phase of infection. The lowest concentration of SARS-CoV-2 viral copies this assay can detect is 138 copies/mL. A negative result does not preclude SARS-Cov-2 infection and should not be used as the sole basis for treatment or other patient management decisions. A negative result may occur with  improper specimen collection/handling, submission of specimen other than nasopharyngeal swab, presence of viral mutation(s) within the areas targeted by this assay, and inadequate number of viral copies(<138 copies/mL). A negative result must be combined with clinical observations, patient history, and epidemiological information. The expected result is Negative.  Fact Sheet for Patients:  EntrepreneurPulse.com.au  Fact Sheet for Healthcare Providers:  IncredibleEmployment.be  This test is no t yet approved or cleared by the [place] FDA and  has been authorized for detection and/or diagnosis of SARS-CoV-2 by FDA under an Emergency Use Authorization (EUA). This EUA will remain  in effect (meaning this test can be used) for the duration of the COVID-19 declaration under Section 564(b)(1) of the Act, 21 U.S.C.section 360bbb-3(b)(1), unless the authorization is terminated  or revoked sooner.       Influenza A by PCR NEGATIVE NEGATIVE Final   Influenza B by PCR NEGATIVE NEGATIVE Final    Comment: (NOTE) The Xpert Xpress SARS-CoV-2/FLU/RSV plus assay is intended as an aid in the diagnosis of influenza from Nasopharyngeal swab specimens and should not be used as a sole basis for treatment. Nasal washings and aspirates are unacceptable for Xpert Xpress  SARS-CoV-2/FLU/RSV testing.  Fact Sheet for Patients: EntrepreneurPulse.com.au  Fact Sheet for Healthcare Providers: IncredibleEmployment.be  This test is not yet approved or cleared by the [place] FDA and has been authorized for detection and/or diagnosis of SARS-CoV-2 by FDA under an Emergency Use Authorization (EUA). This EUA will remain in effect (meaning this test can be used) for the duration of the COVID-19 declaration under Section 564(b)(1) of the Act, 21 U.S.C. section 360bbb-3(b)(1), unless the authorization is terminated or revoked.  Performed at [facility], [address] [place], [place] [address]   Culture, blood (Routine X 2) w Reflex to ID Panel     Status: None (Preliminary result)   Collection Time: 05/18/20  3:05 AM   Specimen: BLOOD  Result Value Ref Range Status   Specimen Description BLOOD RIGHT ANTECUBITAL  Final   Special Requests   Final    BOTTLES DRAWN AEROBIC AND ANAEROBIC Blood Culture adequate volume   Culture   Final    NO GROWTH 3 DAYS Performed at [facility], [address] [place], [place] [address]    Report Status PENDING  Incomplete  Culture, blood (Routine X 2) w Reflex to ID Panel     Status: None (Preliminary result)   Collection Time: 05/18/20  3:06 AM   Specimen: BLOOD  Result Value Ref Range Status   Specimen Description BLOOD RIGHT HAND  Final   Special Requests   Final    BOTTLES DRAWN AEROBIC AND ANAEROBIC Blood Culture adequate volume   Culture   Final    NO GROWTH 3 DAYS Performed at [facility], [address] [place], [place] [address]    Report Status PENDING  Incomplete         Radiology Studies: No results found.      Scheduled Meds: . amLODipine  10 mg Oral Daily  . docusate sodium  200 mg Oral BID  . insulin aspart  0-5 Units Subcutaneous QHS  . insulin aspart  0-9 Units Subcutaneous TID WC  . insulin aspart  3 Units  Subcutaneous TID WC  . insulin glargine  15 Units Subcutaneous QHS  . lipase/protease/amylase  72,000 Units Oral TID WC  . pantoprazole (PROTONIX) IV  40 mg Intravenous Q24H  . polyethylene glycol  17 g Oral Daily  .  pregabalin  75 mg Oral BID  . rosuvastatin  20 mg Oral Daily  . zolpidem  5 mg Oral QHS   Continuous Infusions: . promethazine (PHENERGAN) injection (IM or IVPB) 12.5 mg (05/21/20 1506)     LOS: 4 days    Time spent: 30 mins     Wyvonnia Dusky, MD [facility] Pager [phone]  If 7PM-7AM, please contact night-coverage 05/22/2020, 1:53 PM

## 2020-05-22 NOTE — TOC Progression Note (Addendum)
Transition of Care [facility]) - Progression Note    Patient Details  Name: ROSINE SOLECKI MRN: [id number] Date of Birth: [birth year]  Transition [facility] Care [facility]) CM/SW [place], LCSW Phone Number: 05/22/2020, 9:24 AM  Clinical Narrative: Patient is agreeable to outpatient PT at [facility]. Will have MD sign order form today and fax to them. Patient stated she is supposed to go home today and has her boyfriend charging her oxygen at home for when he picks her up.    11:37 am: Faxed PT order form to [employer].  Expected Discharge Plan: Home/Self Care Barriers to Discharge: Continued Medical Work up  Expected Discharge Plan and Services Expected Discharge Plan: Home/Self Care       Living arrangements for the past 2 months: Single Family Home                                       Social Determinants of Health (SDOH) Interventions    Readmission Risk Interventions Readmission Risk Prevention Plan 05/20/2020 08/04/2019 06/15/2019  Transportation Screening Complete Complete -  PCP or Specialist Appt within 3-5 Days Complete Complete -  Social Work Consult for [place] Planning/Counseling Complete Complete -  Palliative Care Screening Not Applicable Not Applicable Not Applicable  Medication Review Press photographer) Complete Complete Complete  Some recent data might be hidden

## 2020-05-23 ENCOUNTER — Inpatient Hospital Stay: Payer: Medicaid Other

## 2020-05-23 LAB — CBC
HCT: 34.4 % — ABNORMAL LOW (ref 36.0–46.0)
Hemoglobin: 11.6 g/dL — ABNORMAL LOW (ref 12.0–15.0)
MCH: 28.9 pg (ref 26.0–34.0)
MCHC: 33.7 g/dL (ref 30.0–36.0)
MCV: 85.8 fL (ref 80.0–100.0)
Platelets: 311 10*3/uL (ref 150–400)
RBC: 4.01 MIL/uL (ref 3.87–5.11)
RDW: 11.6 % (ref 11.5–15.5)
WBC: 11.4 10*3/uL — ABNORMAL HIGH (ref 4.0–10.5)
nRBC: 0 % (ref 0.0–0.2)

## 2020-05-23 LAB — COMPREHENSIVE METABOLIC PANEL
ALT: 48 U/L — ABNORMAL HIGH (ref 0–44)
AST: 46 U/L — ABNORMAL HIGH (ref 15–41)
Albumin: 3.3 g/dL — ABNORMAL LOW (ref 3.5–5.0)
Alkaline Phosphatase: 203 U/L — ABNORMAL HIGH (ref 38–126)
Anion gap: 10 (ref 5–15)
BUN: 39 mg/dL — ABNORMAL HIGH (ref 8–23)
CO2: 27 mmol/L (ref 22–32)
Calcium: 8.8 mg/dL — ABNORMAL LOW (ref 8.9–10.3)
Chloride: 102 mmol/L (ref 98–111)
Creatinine, Ser: 1.55 mg/dL — ABNORMAL HIGH (ref 0.44–1.00)
GFR, Estimated: 37 mL/min — ABNORMAL LOW (ref >60)
Glucose, Bld: 273 mg/dL — ABNORMAL HIGH (ref 70–99)
Potassium: 4 mmol/L (ref 3.5–5.1)
Sodium: 139 mmol/L (ref 135–145)
Total Bilirubin: 0.5 mg/dL (ref 0.3–1.2)
Total Protein: 6.7 g/dL (ref 6.5–8.1)

## 2020-05-23 LAB — GLUCOSE, CAPILLARY
Glucose-Capillary: 259 mg/dL — ABNORMAL HIGH (ref 70–99)
Glucose-Capillary: 249 mg/dL — ABNORMAL HIGH (ref 70–99)
Glucose-Capillary: 140 mg/dL — ABNORMAL HIGH (ref 70–99)
Glucose-Capillary: 243 mg/dL — ABNORMAL HIGH (ref 70–99)
Glucose-Capillary: 267 mg/dL — ABNORMAL HIGH (ref 70–99)

## 2020-05-23 LAB — CULTURE, BLOOD (ROUTINE X 2)
Culture: NO GROWTH
Culture: NO GROWTH
Special Requests: ADEQUATE
Special Requests: ADEQUATE

## 2020-05-23 MED ORDER — DIPHENHYDRAMINE HCL 25 MG PO CAPS
25.0000 mg | ORAL_CAPSULE | Freq: Four times a day (QID) | ORAL | Status: DC | PRN
  Administered 2020-05-23: 25 mg via ORAL
  Filled 2020-05-23: qty 1

## 2020-05-23 MED ORDER — LACTULOSE 10 GM/15ML PO SOLN
30.0000 g | Freq: Two times a day (BID) | ORAL | Status: DC
  Administered 2020-05-23 – 2020-05-24 (×2): 30 g via ORAL
  Filled 2020-05-23 (×2): qty 60

## 2020-05-23 MED ORDER — BISACODYL 5 MG PO TBEC
10.0000 mg | DELAYED_RELEASE_TABLET | Freq: Once | ORAL | Status: AC
  Administered 2020-05-23: 10 mg via ORAL
  Filled 2020-05-23: qty 2

## 2020-05-23 MED ORDER — IBUPROFEN 400 MG PO TABS
600.0000 mg | ORAL_TABLET | Freq: Three times a day (TID) | ORAL | Status: DC | PRN
  Filled 2020-05-23: qty 2

## 2020-05-23 MED ORDER — ACETAMINOPHEN 325 MG PO TABS
650.0000 mg | ORAL_TABLET | Freq: Four times a day (QID) | ORAL | Status: DC | PRN
  Administered 2020-05-23: 650 mg via ORAL
  Filled 2020-05-23: qty 2

## 2020-05-23 MED ORDER — SODIUM CHLORIDE 0.9 % IV SOLN: INTRAVENOUS | Status: DC

## 2020-05-23 MED ORDER — SCOPOLAMINE 1 MG/3DAYS TD PT72
1.0000 | MEDICATED_PATCH | TRANSDERMAL | Status: DC
  Administered 2020-05-23: 1.5 mg via TRANSDERMAL
  Filled 2020-05-23 (×2): qty 1

## 2020-05-23 NOTE — Progress Notes (Signed)
PT Cancellation Note  Patient Details Name: Karen [name] MRN: [id number] DOB: [birth year]   Cancelled Treatment:    Reason Eval/Treat Not Completed: Patient declined, no reason specified Pt laying in bed, looking uncomfortable.  Offered PT services and she pleasantly declines citing nausea, vomiting, and abdominal distension.  Apparently she was supposed to have left today per having BM, but despite medical assist with this it did not happen.  She reports she has been up a little with the walker in the room but simply is not feeling well enough to do anything with PT today.    Kreg Shropshire, DPT 05/23/2020, 3:50 PM

## 2020-05-23 NOTE — Progress Notes (Signed)
Mobility Specialist - Progress Note   05/23/20 1500  Mobility  Activity Refused mobility  Mobility performed by Mobility specialist    Pt politely declined mobility this date, no reason specified. Will attempt session another date/time as pt is agreeable.    Kathee Delton Mobility Specialist 05/23/20, 3:05 PM

## 2020-05-23 NOTE — Progress Notes (Signed)
PROGRESS NOTE    Karen [name]  [id number] DOB: 1955/05/05 DOA: 05/17/2020 PCP: Lavera Guise, MD   Assessment & Plan:   Principal Problem:   Intractable nausea and vomiting Active Problems:   Poorly controlled type 2 diabetes mellitus ([place])   Pancreatitis, acute   Hypertension   HLD (hyperlipidemia)   GERD (gastroesophageal reflux disease)   CKD (chronic kidney disease), stage IIIa   Epigastric pain   Acute cystitis   Constipation: continue on colace, miralax. Dulcolax x1. Will add lactulose if no bowel movement this afternoon, XR abd shows diffuse stool throughout colon in a pattern indicative of constipation & No bowel obstruction or free air evident   Acute UTI: completed abx course.  Urine culture from 05/15/2020 showed Staphylococcus lugdunensis.    CKDIIIb: Cr continues to trend down daily. Will continue to monitor   DM2: poorly controlled. Continue on lantus, SSI w/ accuchecks   HTN: continue on amlodipine and continue to hold HCTZ secondary to rising Cr   HTN urgency: resolved   History of alcohol use disorder: has not drank alcohol for 7 years     DVT prophylaxis: SCDs Code Status: full Family Communication: Disposition Plan: likely d/c back home   Level of care: Med-Surg   Status is: Inpatient  Remains inpatient appropriate because:IV treatments appropriate due to intensity of illness or inability to take PO and Inpatient level of care appropriate due to severity of illness   Dispo: The patient is from: Home              Anticipated d/c is to: Home              Patient currently is not medically stable to d/c.   Difficult to place patient Yes     Consultants:      Procedures:    Antimicrobials:    Subjective: Pt c/o abd pain   Objective: Vitals:   05/22/20 1542 05/22/20 2011 05/23/20 0410 05/23/20 0440  BP: 125/77 132/71 125/85   Pulse: 68 69 72   Resp: 18 18 16    Temp: 98.5 F (36.9 C) 98.4 F (36.9 C) 97.9 F (36.6  C)   TempSrc: Oral Oral    SpO2: 99% 100% 97%   Weight:    54.8 kg  Height:        Intake/Output Summary (Last 24 hours) at 05/23/2020 0742 Last data filed at 05/22/2020 1843 Gross per 24 hour  Intake 720 ml  Output --  Net 720 ml   Filed Weights   05/21/20 0500 05/22/20 0500 05/23/20 0440  Weight: 51.9 kg 54.8 kg 54.8 kg    Examination:  General exam: Appears uncomfortable  Respiratory system: clear breath sounds b/l Cardiovascular system: S1/S2+. No clicks or rubs   Gastrointestinal system: Abd is distended, tender to palpation & hypoactive bowel sounds present  Central nervous system: Alert and oriented. Moves all extremities  Psychiatry: Judgement and insight appear normal. Flat mood and affect    Data Reviewed: I have personally reviewed following labs and imaging studies  CBC: Recent Labs  Lab 05/17/20 1541 05/18/20 0305 05/23/20 0423  WBC 11.3* 10.0 11.4*  NEUTROABS 8.0*  --   --   HGB 13.0 10.9* 11.6*  HCT 36.1 30.1* 34.4*  MCV 82.0 81.6 85.8  PLT 270 251 [id number]   Basic Metabolic Panel: Recent Labs  Lab 05/17/20 1541 05/18/20 0305 05/19/20 0420 05/20/20 2121 05/22/20 0543 05/23/20 0423  NA 130* 138 141 132* 137 139  K  3.8 3.7 3.5 4.4 3.9 4.0  CL 95* 106 105 97* 101 102  CO2 23 26 26 26 25 27   GLUCOSE 362* 232* 174* 510* 214* 273*  BUN 16 14 13 22  38* 39*  CREATININE 1.52* 1.18* 1.20* 1.80* 1.67* 1.55*  CALCIUM 8.9 8.5* 8.5* 8.1* 8.8* 8.8*  MG 2.0 2.0 1.9  --   --   --   PHOS  --  2.7 2.8  --   --   --    GFR: Estimated Creatinine Clearance: 28 mL/min (A) (by C-G formula based on SCr of 1.55 mg/dL (H)). Liver Function Tests: Recent Labs  Lab 05/17/20 1541 05/18/20 0305 05/23/20 0423  AST 35 22 46*  ALT 29 23 48*  ALKPHOS 196* 157* 203*  BILITOT 0.7 0.4 0.5  PROT 8.1 6.7 6.7  ALBUMIN 4.2 3.3* 3.3*   Recent Labs  Lab 05/17/20 1541 05/18/20 0305  LIPASE 165* 64*   No results for input(s): AMMONIA in the last 168 hours. Coagulation  Profile: No results for input(s): INR, PROTIME in the last 168 hours. Cardiac Enzymes: No results for input(s): CKTOTAL, CKMB, CKMBINDEX, TROPONINI in the last 168 hours. BNP (last 3 results) No results for input(s): PROBNP in the last 8760 hours. HbA1C: Recent Labs    05/20/20 2121  HGBA1C 10.1*   CBG: Recent Labs  Lab 05/22/20 1132 05/22/20 1639 05/22/20 2014 05/23/20 0406 05/23/20 0736  GLUCAP 150* 138* 173* 259* 249*   Lipid Profile: No results for input(s): CHOL, HDL, LDLCALC, TRIG, CHOLHDL, LDLDIRECT in the last 72 hours. Thyroid Function Tests: No results for input(s): TSH, T4TOTAL, FREET4, T3FREE, THYROIDAB in the last 72 hours. Anemia Panel: No results for input(s): VITAMINB12, FOLATE, FERRITIN, TIBC, IRON, RETICCTPCT in the last 72 hours. Sepsis Labs: No results for input(s): PROCALCITON, LATICACIDVEN in the last 168 hours.  Recent Results (from the past 240 hour(s))  Urine Culture     Status: Abnormal   Collection Time: 05/15/20  3:42 PM   Specimen: Urine, Random  Result Value Ref Range Status   Specimen Description   Final    URINE, RANDOM Performed at [facility], [address] [place], [place] [address]    Special Requests   Final    NONE Performed at [facility], [place], [place] [address]    Culture >=100,000 COLONIES/mL STAPHYLOCOCCUS LUGDUNENSIS (A)  Final   Report Status 05/18/2020 FINAL  Final   Organism ID, Bacteria STAPHYLOCOCCUS LUGDUNENSIS (A)  Final      Susceptibility   Staphylococcus lugdunensis - MIC*    CIPROFLOXACIN <=0.5 SENSITIVE Sensitive     GENTAMICIN <=0.5 SENSITIVE Sensitive     NITROFURANTOIN <=16 SENSITIVE Sensitive     OXACILLIN 2 SENSITIVE Sensitive     TETRACYCLINE <=1 SENSITIVE Sensitive     VANCOMYCIN <=0.5 SENSITIVE Sensitive     TRIMETH/SULFA <=10 SENSITIVE Sensitive     CLINDAMYCIN <=0.25 SENSITIVE Sensitive     RIFAMPIN <=0.5 SENSITIVE Sensitive     Inducible Clindamycin  NEGATIVE Sensitive     * >=100,000 COLONIES/mL STAPHYLOCOCCUS LUGDUNENSIS  Resp Panel by RT-PCR (Flu A&B, Covid) Nasopharyngeal Swab     Status: None   Collection Time: 05/17/20  4:22 PM   Specimen: Nasopharyngeal Swab; Nasopharyngeal(NP) swabs in vial transport medium  Result Value Ref Range Status   SARS Coronavirus 2 by RT PCR NEGATIVE NEGATIVE Final    Comment: (NOTE) SARS-CoV-2 target nucleic acids are NOT DETECTED.  The SARS-CoV-2 RNA is generally  detectable in upper respiratory specimens during the acute phase of infection. The lowest concentration of SARS-CoV-2 viral copies this assay can detect is 138 copies/mL. A negative result does not preclude SARS-Cov-2 infection and should not be used as the sole basis for treatment or other patient management decisions. A negative result may occur with  improper specimen collection/handling, submission of specimen other than nasopharyngeal swab, presence of viral mutation(s) within the areas targeted by this assay, and inadequate number of viral copies(<138 copies/mL). A negative result must be combined with clinical observations, patient history, and epidemiological information. The expected result is Negative.  Fact Sheet for Patients:  EntrepreneurPulse.com.au  Fact Sheet for Healthcare Providers:  IncredibleEmployment.be  This test is no t yet approved or cleared by the [place] FDA and  has been authorized for detection and/or diagnosis of SARS-CoV-2 by FDA under an Emergency Use Authorization (EUA). This EUA will remain  in effect (meaning this test can be used) for the duration of the COVID-19 declaration under Section 564(b)(1) of the Act, 21 U.S.C.section 360bbb-3(b)(1), unless the authorization is terminated  or revoked sooner.       Influenza A by PCR NEGATIVE NEGATIVE Final   Influenza B by PCR NEGATIVE NEGATIVE Final    Comment: (NOTE) The Xpert Xpress SARS-CoV-2/FLU/RSV  plus assay is intended as an aid in the diagnosis of influenza from Nasopharyngeal swab specimens and should not be used as a sole basis for treatment. Nasal washings and aspirates are unacceptable for Xpert Xpress SARS-CoV-2/FLU/RSV testing.  Fact Sheet for Patients: EntrepreneurPulse.com.au  Fact Sheet for Healthcare Providers: IncredibleEmployment.be  This test is not yet approved or cleared by the [place] FDA and has been authorized for detection and/or diagnosis of SARS-CoV-2 by FDA under an Emergency Use Authorization (EUA). This EUA will remain in effect (meaning this test can be used) for the duration of the COVID-19 declaration under Section 564(b)(1) of the Act, 21 U.S.C. section 360bbb-3(b)(1), unless the authorization is terminated or revoked.  Performed at [facility], [address] [place], [place] [address]   Culture, blood (Routine X 2) w Reflex to ID Panel     Status: None   Collection Time: 05/18/20  3:05 AM   Specimen: BLOOD  Result Value Ref Range Status   Specimen Description BLOOD RIGHT ANTECUBITAL  Final   Special Requests   Final    BOTTLES DRAWN AEROBIC AND ANAEROBIC Blood Culture adequate volume   Culture   Final    NO GROWTH 5 DAYS Performed at [facility], [address], [place], [place] [address]    Report Status 05/23/2020 FINAL  Final  Culture, blood (Routine X 2) w Reflex to ID Panel     Status: None   Collection Time: 05/18/20  3:06 AM   Specimen: BLOOD  Result Value Ref Range Status   Specimen Description BLOOD RIGHT HAND  Final   Special Requests   Final    BOTTLES DRAWN AEROBIC AND ANAEROBIC Blood Culture adequate volume   Culture   Final    NO GROWTH 5 DAYS Performed at [facility], [address] [place], [place] [address]    Report Status 05/23/2020 FINAL  Final         Radiology Studies: No results found.      Scheduled Meds: . amLODipine   10 mg Oral Daily  . docusate sodium  200 mg Oral BID  . hydrocortisone cream   Topical BID  . insulin aspart  0-5  Units Subcutaneous QHS  . insulin aspart  0-9 Units Subcutaneous TID WC  . insulin aspart  3 Units Subcutaneous TID WC  . insulin glargine  15 Units Subcutaneous QHS  . lipase/protease/amylase  72,000 Units Oral TID WC  . pantoprazole (PROTONIX) IV  40 mg Intravenous Q24H  . polyethylene glycol  17 g Oral Daily  . pregabalin  75 mg Oral BID  . rosuvastatin  20 mg Oral Daily  . zolpidem  5 mg Oral QHS   Continuous Infusions: . promethazine (PHENERGAN) injection (IM or IVPB) 12.5 mg (05/21/20 1506)     LOS: 5 days    Time spent: 32 mins     Wyvonnia Dusky, MD [facility] Pager [phone]  If 7PM-7AM, please contact night-coverage 05/23/2020, 7:42 AM

## 2020-05-24 LAB — COMPREHENSIVE METABOLIC PANEL
ALT: 49 U/L — ABNORMAL HIGH (ref 0–44)
AST: 53 U/L — ABNORMAL HIGH (ref 15–41)
Albumin: 3 g/dL — ABNORMAL LOW (ref 3.5–5.0)
Alkaline Phosphatase: 202 U/L — ABNORMAL HIGH (ref 38–126)
Anion gap: 5 (ref 5–15)
BUN: 30 mg/dL — ABNORMAL HIGH (ref 8–23)
CO2: 26 mmol/L (ref 22–32)
Calcium: 8.3 mg/dL — ABNORMAL LOW (ref 8.9–10.3)
Chloride: 109 mmol/L (ref 98–111)
Creatinine, Ser: 1.46 mg/dL — ABNORMAL HIGH (ref 0.44–1.00)
GFR, Estimated: 40 mL/min — ABNORMAL LOW (ref >60)
Glucose, Bld: 249 mg/dL — ABNORMAL HIGH (ref 70–99)
Potassium: 4.4 mmol/L (ref 3.5–5.1)
Sodium: 140 mmol/L (ref 135–145)
Total Bilirubin: 0.5 mg/dL (ref 0.3–1.2)
Total Protein: 6.1 g/dL — ABNORMAL LOW (ref 6.5–8.1)

## 2020-05-24 LAB — CBC
HCT: 32 % — ABNORMAL LOW (ref 36.0–46.0)
Hemoglobin: 10.9 g/dL — ABNORMAL LOW (ref 12.0–15.0)
MCH: 29.5 pg (ref 26.0–34.0)
MCHC: 34.1 g/dL (ref 30.0–36.0)
MCV: 86.5 fL (ref 80.0–100.0)
Platelets: 277 10*3/uL (ref 150–400)
RBC: 3.7 MIL/uL — ABNORMAL LOW (ref 3.87–5.11)
RDW: 11.8 % (ref 11.5–15.5)
WBC: 10.2 10*3/uL (ref 4.0–10.5)
nRBC: 0 % (ref 0.0–0.2)

## 2020-05-24 LAB — GLUCOSE, CAPILLARY
Glucose-Capillary: 226 mg/dL — ABNORMAL HIGH (ref 70–99)
Glucose-Capillary: 157 mg/dL — ABNORMAL HIGH (ref 70–99)

## 2020-05-24 MED ORDER — INSULIN GLARGINE 100 UNIT/ML ~~LOC~~ SOLN
15.0000 [IU] | Freq: Every day | SUBCUTANEOUS | 0 refills | Status: DC
Start: 2020-05-24 — End: 2023-05-21

## 2020-05-24 MED ORDER — NITROFURANTOIN MONOHYD MACRO 100 MG PO CAPS: 100.0000 mg | ORAL_CAPSULE | Freq: Two times a day (BID) | ORAL | 0 refills | Status: AC

## 2020-05-24 NOTE — TOC Transition Note (Signed)
Transition of Care [facility]) - CM/SW Discharge Note   Patient Details  Name: Karen [name] MRN: [id number] Date of Birth: 10/26/1955  [facility] CM/SW Contact:  Candie Chroman, LCSW Phone Number: 05/24/2020, 11:00 AM   Clinical Narrative:  Patient has orders to discharge home today. No further concerns. CSW signing off.   Final next level of care: OP Rehab Barriers to Discharge: Barriers Resolved   Patient Goals and CMS Choice        Discharge Placement                Patient to be transferred to facility by: Boyfriend will take her home   Patient and family notified of of transfer: 05/24/20  Discharge Plan and Services                                     Social Determinants of Health (SDOH) Interventions     Readmission Risk Interventions Readmission Risk Prevention Plan 05/20/2020 08/04/2019 06/15/2019  Transportation Screening Complete Complete -  PCP or Specialist Appt within 3-5 Days Complete Complete -  Social Work Consult for [place] Planning/Counseling Complete Complete -  Palliative Care Screening Not Applicable Not Applicable Not Applicable  Medication Review Press photographer) Complete Complete Complete  Some recent data might be hidden

## 2020-05-24 NOTE — Discharge Summary (Signed)
Physician Discharge Summary  Karen [name] [id number] DOB: [birth year] DOA: 05/17/2020  PCP: Lavera Guise, MD  Admit date: 05/17/2020 Discharge date: 05/24/2020  Admitted From: home  Disposition: home   Recommendations for Outpatient Follow-up:  1. Follow up with PCP in 1-2 weeks   Home Health: no  Equipment/Devices:  Discharge Condition: stable  CODE STATUS: full  Diet recommendation: Heart Healthy / Carb Modified / Regular / Dysphagia   Brief/Interim Summary: HPI was taken from Dr. Velia Meyer: Karen [name] is a 65 y.o. female with medical history significant for poorly controlled type 2 diabetes mellitus with most recent hemoglobin A1c 11.3% in March 2022, chronic abdominal pain, recurrent UTI, hypertension, hyperlipidemia, GERD, stage IIIb chronic kidney disease with baseline creatinine 1.4-1.5, chronically elevated alkaline phosphatase, who is admitted to [facility] on 05/17/2020 with intractable nausea/vomiting after presenting from home to [facility] ED complaining of ongoing nausea/vomiting.   The patient originally presented to [facility] ED on 05/15/2020 complaining of, at that time, 2 to 3 days of nausea/vomiting as well as dysuria.  Relative to her chronic abdominal discomfort, she reported that this sharp, nonradiating intermittent epigastric discomfort was of similar quality relative to baseline, but of slightly increased severity.  At that time, she had been experiencing intermittent nausea resulting in 2-3 episodes of nonbloody, nonbilious emesis per day.  She was diagnosed with acute cystitis following urinalysis that showed 21-50 white blood cells large leukocyte esterase, no squamous epithelial cells.  As further evaluation of her acute on chronic abdominal discomfort, she underwent CT abdomen/pelvis which showed a 1 mm nonobstructing stone in the medial right kidney, but otherwise no evidence of acute intra-abdominal process, including no evidence of acute or chronic  pancreatitis, no evidence of gallbladder wall thickening, pericholecystic fluid, cholelithiasis, or common bile duct dilation.  Additionally, no evidence of bowel obstruction, abscess, or perforation.  EKG at that time showed sinus rhythm without evidence of acute ischemic changes. The patient's nausea is improved with Zofran, she was able to pass her PO water challenge, and she was subsequently discharged home from the emergency department on a 5-day course of nitrofurantoin for acute cystitis as well as as needed Zofran ODT.  However, in the interval, the patient reports continued intermittent nausea resulting in 3-4 episodes of nonbloody, nonbilious emesis per day, with most recent such episode occurring in the emergency department today.  In the setting, she reports that she has been unable to tolerate any p.o. since discharge from the emergency department on 05/15/2020, including unable to tolerate any food, water, or medications.  She specifically mentions that she has thrown up within 20 minutes every time she has taken a dose of nitrofurantoin, and that the nausea has not improved as result of multiple doses of as needed Zofran ODT at home.  She reports no interval change in her epigastric abdominal discomfort that she was experiencing on 05/15/2020.  Specifically, she reports no change in distribution, intensity, or quality.  She reports to me that the location, quality, and intensity of her current abdominal discomfort is consistent with acute on chronic abdominal discomfort that she typically experiences at the time of her recurrent urinary tract infections.  She denies any diarrhea, melena, hematochezia, rash.  No recent trauma.  She is able to convey her chief concern about her inability to tolerate p.o. in the setting of recurrent nausea/vomiting, complicating her ability to take oral antibiotics at home in the setting of recent diagnosis of acute cystitis.  She continues  to note dysuria, without any  significant improvement in the interval following diagnosis of acute cystitis on 05/15/2020.  She denies any associated gross hematuria or change in urinary urgency/frequency.  Continues to produce flatus.  She confirms a history of recurrent urinary tract infections, and conveys that she has frequently experienced nausea/vomiting associated with these prior diagnoses.  She denies any recent flank discomfort.   She reports associated subjective fever in the absence of chills, full body rigors, or generalized myalgias.  She reports mild symmetrical frontal lobe headache in the absence of any associated neck stiffness, sore throat, shortness of breath, wheezing, cough.  Denies any known recent COVID-19 exposures.  Not associated with any recent chest pain, diaphoresis, palpitations, dizziness, presyncope, or syncope.  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  Denies any recreational drug use, including the use of THC.  Per chart review, urine culture collected in the emergency department on 05/15/2020 has grown greater than 100,000 colonies of staph lugdunensis.  Per chart review, this is pansensitive, including to penicillin.     ED Course:  Vital signs in the ED were notable for the following: Temperature max 98.5, heart rate 73-89; blood pressure 133/80-166/81, respiratory rate 16-24, oxygen saturation 95 to 100% on room air.  Labs were notable for the following: CMP was notable for the following: Sodium 130, which corrects to 134 when taking into account concomitant hyperglycemia, potassium 3.8, bicarbonate 23, anion gap 12, creatinine 1.52 relative to 1.5 on 05/15/2020, glucose 362, albumin 4.2, alkaline phosphatase 196 compared to 224 on 05/15/2020, AST 35, ALT 29, total bilirubin 0.7.  Lipase 165 compared to 122 on 05/15/2020.  Nasopharyngeal COVID-19/influenza PCR were checked in  the ED this evening and found to be negative.  Of note, point-of-care glucose following administration of IV fluids, as further detailed below, trended down to 265.   Chest x-ray showed no evidence of acute cardiopulmonary process.  Plain films of the abdomen showed no evidence of bowel obstruction or free air.  While in the ED, the following were administered: Protonix 40 mg IV x1, Maalox p.o. x1 dose, Pepcid 40 mg p.o. x1, Dilaudid 0.5 mg IV x1, Haldol 2.5 mg IV x1, Zofran 4 mg IV x1, Rocephin 1 g IV x1, normal saline x1 L bolus, lactated Ringer's x1 L bolus.  In the setting of persistent nausea/vomiting in spite of the above measures, the patient was subsequently admitted to the med telemetry floor for further evaluation management of intractable nausea/vomiting.   From Dr. Mal Misty: Karen [name] is a 65 y.o. female with medical history significant for poorly controlled type II DM (recent hemoglobin A1c 11.3 in March 2022), chronic abdominal pain, recurrent UTI, hypertension, hyperlipidemia, GERD, stage IIIb CKD, chronically elevated alkaline phosphatase, recent diagnosis of UTI in the emergency room on 05/15/2020.  She presented to the hospital because of nausea, vomiting and abdominal pain.  She was admitted to the hospital for acute pancreatitis and acute UTI.  She was treated with bowel rest, IV fluids, analgesics and antiemetics. She developed hypoglycemia and hyperglycemia in the hospital.  Glucose levels were erratic and difficult to control.   Hospital course from Dr. Jimmye Norman 5/18-5/20/22: Pt was found to have UTI and completed a course of antibiotics while inpatient. Also, pt was found to have pancreatitis and was treated w/ IVFs and bowel rest. Pancreatitis resolved prior to d/c. Furthermore, pt was  also found to have constipation which resolved after taking colace, miralax, dulcolax & lactulose.   Discharge Diagnoses:  Principal Problem:   Intractable nausea and vomiting Active  Problems:   Poorly controlled type 2 diabetes mellitus (HCC)   Pancreatitis, acute   Hypertension   HLD (hyperlipidemia)   GERD (gastroesophageal reflux disease)   CKD (chronic kidney disease), stage IIIa   Epigastric pain   Acute cystitis   Constipation: continue on colace, miralax. Dulcolax x1. XR abd shows diffuse stool throughout colon in a pattern indicative of constipation & No bowel obstruction or free air evident. Resolved  Pancreatitis: resolved   Acute YBO:FBPZWCHEN abx course.Urine culture from 05/15/2020 showed Staphylococcus lugdunensis.   CKDIIIb:Cr continues to trend down daily. Will continue to monitor   DM2:poorly controlled. Continue on lantus, SSI w/ accuchecks   IDP:OEUMPNTI on amlodipine and continue to hold HCTZ secondary to rising Cr   HTN urgency: resolved   History of alcohol use disorder: has not drank alcohol for 7 years  Discharge Instructions  Discharge Instructions    Diet - low sodium heart healthy   Complete by: As directed    Diet Carb Modified   Complete by: As directed    Discharge instructions   Complete by: As directed    F/u w/ PCP in 1 week   Increase activity slowly   Complete by: As directed      Allergies as of 05/24/2020      Reactions   Penicillins Anaphylaxis   Has patient had a PCN reaction causing immediate rash, facial/tongue/throat swelling, SOB or lightheadedness with hypotension: Yes Has patient had a PCN reaction causing severe rash involving mucus membranes or skin necrosis: No Has patient had a PCN reaction that required hospitalization No Has patient had a PCN reaction occurring within the last 10 years: No If all of the above answers are "NO", then may proceed with Cephalosporin use.   Reglan [metoclopramide] Other (See Comments)   Hypotension, shortness of breath   Fentanyl    Rash   Percocet [oxycodone-acetaminophen] Rash   plain tylenol can also make itch?      Medication List    STOP  taking these medications   insulin glargine 100 unit/mL Sopn Commonly known as: LANTUS Replaced by: insulin glargine 100 UNIT/ML injection     TAKE these medications   amLODipine 10 MG tablet Commonly known as: NORVASC Take 1 tablet (10 mg total) by mouth daily.   ARIPiprazole 5 MG tablet Commonly known as: ABILIFY Take 1 tablet (5 mg total) by mouth daily.   cetirizine 5 MG tablet Commonly known as: ZYRTEC Take 1 tablet (5 mg total) by mouth daily.   cyclobenzaprine 5 MG tablet Commonly known as: FLEXERIL Take 5 mg by mouth 2 (two) times daily as needed for muscle spasms.   docusate sodium 100 MG capsule Commonly known as: COLACE Take 1 capsule (100 mg total) by mouth 2 (two) times daily.   famotidine 20 MG tablet Commonly known as: PEPCID Take 1 tablet (20 mg total) by mouth 2 (two) times daily.   HYDROcodone-acetaminophen 5-325 MG tablet Commonly known as: NORCO/VICODIN One tablet every 6 hours as needed for severe pain.   insulin aspart 100 UNIT/ML FlexPen Commonly known as: NOVOLOG Inject 2 Units into the skin 3 (three) times daily with meals.   insulin glargine 100 UNIT/ML injection Commonly known as: LANTUS Inject 0.15 mLs (15 Units total) into the skin at bedtime. Replaces: insulin glargine 100 unit/mL Sopn  Insulin Pen Needle 29G X 12MM Misc 1 Dose by Does not apply route 3 (three) times daily before meals.   lipase/protease/amylase 36000 UNITS Cpep capsule Commonly known as: CREON Take 2 capsules (72,000 Units total) by mouth 3 (three) times daily with meals.   nitrofurantoin (macrocrystal-monohydrate) 100 MG capsule Commonly known as: Macrobid Take 1 capsule (100 mg total) by mouth 2 (two) times daily for 5 days.   omeprazole 40 MG capsule Commonly known as: PRILOSEC Take 1 capsule (40 mg total) by mouth in the morning and at bedtime.   ondansetron 4 MG disintegrating tablet Commonly known as: Zofran ODT Take 1 tablet (4 mg total) by mouth  every 8 (eight) hours as needed for nausea or vomiting.   ondansetron 4 MG disintegrating tablet Commonly known as: Zofran ODT Take 1 tablet (4 mg total) by mouth every 8 (eight) hours as needed for nausea or vomiting.   Polyethylene Glycol 3350 Powd Take 17 g by mouth at bedtime. Mix 17 grams in 4-8 oz of liquid and drink   pregabalin 75 MG capsule Commonly known as: LYRICA Take 1 capsule (75 mg total) by mouth 2 (two) times daily.   rosuvastatin 20 MG tablet Commonly known as: CRESTOR Take 1 tablet (20 mg total) by mouth daily.   simethicone 80 MG chewable tablet Commonly known as: MYLICON Chew 1 tablet (80 mg total) by mouth 4 (four) times daily. What changed:   when to take this  reasons to take this   triamcinolone cream 0.5 % Commonly known as: KENALOG Apply twice a day to back of thighs   Vascepa 0.5 g Caps Generic drug: Icosapent Ethyl Take 4 capsules by mouth in the morning and at bedtime.   zolpidem 10 MG tablet Commonly known as: AMBIEN Take 1 tablet by mouth at bedtime.       Allergies  Allergen Reactions  . Penicillins Anaphylaxis    Has patient had a PCN reaction causing immediate rash, facial/tongue/throat swelling, SOB or lightheadedness with hypotension: Yes Has patient had a PCN reaction causing severe rash involving mucus membranes or skin necrosis: No Has patient had a PCN reaction that required hospitalization No Has patient had a PCN reaction occurring within the last 10 years: No If all of the above answers are "NO", then may proceed with Cephalosporin use.  . Reglan [Metoclopramide] Other (See Comments)    Hypotension, shortness of breath  . Fentanyl     Rash   . Percocet [Oxycodone-Acetaminophen] Rash    plain tylenol can also make itch?    Consultations:     Procedures/Studies: CT ABDOMEN PELVIS WO CONTRAST  Result Date: 05/15/2020 CLINICAL DATA:  Acute nonlocalized abdominal pain. History of cirrhosis and chronic  pancreatitis. Right upper quadrant pain. EXAM: CT ABDOMEN AND PELVIS WITHOUT CONTRAST TECHNIQUE: Multidetector CT imaging of the abdomen and pelvis was performed following the standard protocol without IV contrast. COMPARISON:  04/23/2020 FINDINGS: Lower chest: Scarring at the lung bases. Mild atelectasis in the left lower lobe not seen previously. Hepatobiliary: Cirrhosis not detectable by CT. No calcified gallstones. No ductal dilatation. Pancreas: Normal.  No evidence of acute or chronic pancreatitis. Spleen: Normal Adrenals/Urinary Tract: Adrenal glands are normal. 1 mm nonobstructing stone in the right kidney as seen previously. The right ureter is chronically dilated all the way to the bladder. No obstructing lesion is seen. Left kidney is normal. Mild chronic wall thickening of the bladder. Stomach/Bowel: No acute or significant bowel finding. No evidence of obstruction or inflammatory  disease. Previous appendectomy. Vascular/Lymphatic: Aortic atherosclerosis. No aneurysm. IVC is normal. No retroperitoneal adenopathy. Reproductive: Previous hysterectomy.  No pelvic mass. Other: No free fluid or air. Musculoskeletal: Ordinary mild lumbar degenerative changes. Insignificant lipoma within the right the psoas muscle at the level of the hip. IMPRESSION: Minimal atelectasis at the left base not seen previously. No cause of acute right upper quadrant pain is identified. No sign hepatobiliary pathology on this noncontrast CT. 1 mm nonobstructing stone medial right kidney. Fullness of the right ureter but no obstructing abnormality seen. Bladder is full and shows wall thickening, which could indicate chronic cystitis. Aortic Atherosclerosis (ICD10-I70.0). Electronically Signed   By: Nelson Chimes M.D.   On: 05/15/2020 14:52   DG Abdomen Acute W/Chest  Result Date: 05/17/2020 CLINICAL DATA:  Abdominal pain and vomiting EXAM: DG ABDOMEN ACUTE WITH 1 VIEW CHEST COMPARISON:  CT abdomen and pelvis May 15, 2020 FINDINGS:  PA chest: Lungs are clear. Heart size and pulmonary vascularity are normal. No adenopathy. There are surgical clips to the right of the trachea in the upper thoracic region. Supine and upright abdomen: There is contrast within the colon. There is no appreciable bowel dilatation or air-fluid levels. No free air. Vascular calcification noted in the pelvis. IMPRESSION: No evident bowel obstruction or free air.  Lungs clear. Electronically Signed   By: Lowella Grip III M.D.   On: 05/17/2020 16:10   DG Abd Portable 1V  Result Date: 05/23/2020 CLINICAL DATA:  Abdominal pain and constipation EXAM: PORTABLE ABDOMEN - 1 VIEW COMPARISON:  May 17, 2020 FINDINGS: There is diffuse stool throughout the colon. There is no bowel dilatation or air-fluid level to suggest bowel obstruction. No free air. There are apparent phleboliths in the pelvis. Visualized lung bases are clear. IMPRESSION: Diffuse stool throughout colon in a pattern indicative of constipation. No bowel obstruction or free air evident on supine examination. Electronically Signed   By: Lowella Grip III M.D.   On: 05/23/2020 13:11      Subjective: Pt c/o fatigue    Discharge Exam: Vitals:   05/24/20 1053 05/24/20 1054  BP: (!) 142/77 (!) 142/77  Pulse: 77 79  Resp: 18 18  Temp: 98.2 F (36.8 C) 98.2 F (36.8 C)  SpO2: 99% 98%   Vitals:   05/24/20 0327 05/24/20 0903 05/24/20 1053 05/24/20 1054  BP: 124/67 (!) 148/73 (!) 142/77 (!) 142/77  Pulse: 72 74 77 79  Resp: 16  18 18   Temp: 97.9 F (36.6 C) 98.3 F (36.8 C) 98.2 F (36.8 C) 98.2 F (36.8 C)  TempSrc: Oral Oral Oral Oral  SpO2: 97% 99% 99% 98%  Weight:      Height:        General: Pt is alert, awake, not in acute distress Cardiovascular: S1/S2 +, no rubs, no gallops Respiratory: CTA bilaterally, no wheezing, no rhonchi Abdominal: Soft, NT, bowel sounds + Extremities: no edema, no cyanosis    The results of significant diagnostics from this hospitalization  (including imaging, microbiology, ancillary and laboratory) are listed below for reference.     Microbiology: Recent Results (from the past 240 hour(s))  Urine Culture     Status: Abnormal   Collection Time: 05/15/20  3:42 PM   Specimen: Urine, Random  Result Value Ref Range Status   Specimen Description   Final    URINE, RANDOM Performed at [facility], [address] [place], [place] [address]    Special Requests   Final    NONE  Performed at [facility], [address] [place], [place] [address]    Culture >=100,000 COLONIES/mL STAPHYLOCOCCUS LUGDUNENSIS (A)  Final   Report Status 05/18/2020 FINAL  Final   Organism ID, Bacteria STAPHYLOCOCCUS LUGDUNENSIS (A)  Final      Susceptibility   Staphylococcus lugdunensis - MIC*    CIPROFLOXACIN <=0.5 SENSITIVE Sensitive     GENTAMICIN <=0.5 SENSITIVE Sensitive     NITROFURANTOIN <=16 SENSITIVE Sensitive     OXACILLIN 2 SENSITIVE Sensitive     TETRACYCLINE <=1 SENSITIVE Sensitive     VANCOMYCIN <=0.5 SENSITIVE Sensitive     TRIMETH/SULFA <=10 SENSITIVE Sensitive     CLINDAMYCIN <=0.25 SENSITIVE Sensitive     RIFAMPIN <=0.5 SENSITIVE Sensitive     Inducible Clindamycin NEGATIVE Sensitive     * >=100,000 COLONIES/mL STAPHYLOCOCCUS LUGDUNENSIS  Resp Panel by RT-PCR (Flu A&B, Covid) Nasopharyngeal Swab     Status: None   Collection Time: 05/17/20  4:22 PM   Specimen: Nasopharyngeal Swab; Nasopharyngeal(NP) swabs in vial transport medium  Result Value Ref Range Status   SARS Coronavirus 2 by RT PCR NEGATIVE NEGATIVE Final    Comment: (NOTE) SARS-CoV-2 target nucleic acids are NOT DETECTED.  The SARS-CoV-2 RNA is generally detectable in upper respiratory specimens during the acute phase of infection. The lowest concentration of SARS-CoV-2 viral copies this assay can detect is 138 copies/mL. A negative result does not preclude SARS-Cov-2 infection and should not be used as the sole basis for treatment or other  patient management decisions. A negative result may occur with  improper specimen collection/handling, submission of specimen other than nasopharyngeal swab, presence of viral mutation(s) within the areas targeted by this assay, and inadequate number of viral copies(<138 copies/mL). A negative result must be combined with clinical observations, patient history, and epidemiological information. The expected result is Negative.  Fact Sheet for Patients:  EntrepreneurPulse.com.au  Fact Sheet for Healthcare Providers:  IncredibleEmployment.be  This test is no t yet approved or cleared by the [place] FDA and  has been authorized for detection and/or diagnosis of SARS-CoV-2 by FDA under an Emergency Use Authorization (EUA). This EUA will remain  in effect (meaning this test can be used) for the duration of the COVID-19 declaration under Section 564(b)(1) of the Act, 21 U.S.C.section 360bbb-3(b)(1), unless the authorization is terminated  or revoked sooner.       Influenza A by PCR NEGATIVE NEGATIVE Final   Influenza B by PCR NEGATIVE NEGATIVE Final    Comment: (NOTE) The Xpert Xpress SARS-CoV-2/FLU/RSV plus assay is intended as an aid in the diagnosis of influenza from Nasopharyngeal swab specimens and should not be used as a sole basis for treatment. Nasal washings and aspirates are unacceptable for Xpert Xpress SARS-CoV-2/FLU/RSV testing.  Fact Sheet for Patients: EntrepreneurPulse.com.au  Fact Sheet for Healthcare Providers: IncredibleEmployment.be  This test is not yet approved or cleared by the [place] FDA and has been authorized for detection and/or diagnosis of SARS-CoV-2 by FDA under an Emergency Use Authorization (EUA). This EUA will remain in effect (meaning this test can be used) for the duration of the COVID-19 declaration under Section 564(b)(1) of the Act, 21 U.S.C. section  360bbb-3(b)(1), unless the authorization is terminated or revoked.  Performed at [facility], [address] [place], [place] [address]   Culture, blood (Routine X 2) w Reflex to ID Panel     Status: None   Collection Time: 05/18/20  3:05 AM   Specimen: BLOOD  Result Value Ref  Range Status   Specimen Description BLOOD RIGHT ANTECUBITAL  Final   Special Requests   Final    BOTTLES DRAWN AEROBIC AND ANAEROBIC Blood Culture adequate volume   Culture   Final    NO GROWTH 5 DAYS Performed at [facility], [address] [place], [place] [address]    Report Status 05/23/2020 FINAL  Final  Culture, blood (Routine X 2) w Reflex to ID Panel     Status: None   Collection Time: 05/18/20  3:06 AM   Specimen: BLOOD  Result Value Ref Range Status   Specimen Description BLOOD RIGHT HAND  Final   Special Requests   Final    BOTTLES DRAWN AEROBIC AND ANAEROBIC Blood Culture adequate volume   Culture   Final    NO GROWTH 5 DAYS Performed at [facility], [address] [place], [place] [address]    Report Status 05/23/2020 FINAL  Final     Labs: BNP (last 3 results) Recent Labs    06/13/19 0729  BNP 39.0   Basic Metabolic Panel: Recent Labs  Lab 05/17/20 1541 05/18/20 0305 05/19/20 0420 05/20/20 2121 05/22/20 0543 05/23/20 0423 05/24/20 0545  NA 130* 138 141 132* 137 139 140  K 3.8 3.7 3.5 4.4 3.9 4.0 4.4  CL 95* 106 105 97* 101 102 109  CO2 23 26 26 26 25 27 26   GLUCOSE 362* 232* 174* 510* 214* 273* 249*  BUN 16 14 13 22  38* 39* 30*  CREATININE 1.52* 1.18* 1.20* 1.80* 1.67* 1.55* 1.46*  CALCIUM 8.9 8.5* 8.5* 8.1* 8.8* 8.8* 8.3*  MG 2.0 2.0 1.9  --   --   --   --   PHOS  --  2.7 2.8  --   --   --   --    Liver Function Tests: Recent Labs  Lab 05/17/20 1541 05/18/20 0305 05/23/20 0423 05/24/20 0545  AST 35 22 46* 53*  ALT 29 23 48* 49*  ALKPHOS 196* 157* 203* 202*  BILITOT 0.7 0.4 0.5 0.5  PROT 8.1 6.7 6.7 6.1*  ALBUMIN 4.2 3.3*  3.3* 3.0*   Recent Labs  Lab 05/17/20 1541 05/18/20 0305  LIPASE 165* 64*   No results for input(s): AMMONIA in the last 168 hours. CBC: Recent Labs  Lab 05/17/20 1541 05/18/20 0305 05/23/20 0423 05/24/20 0545  WBC 11.3* 10.0 11.4* 10.2  NEUTROABS 8.0*  --   --   --   HGB 13.0 10.9* 11.6* 10.9*  HCT 36.1 30.1* 34.4* 32.0*  MCV 82.0 81.6 85.8 86.5  PLT 270 251 311 277   Cardiac Enzymes: No results for input(s): CKTOTAL, CKMB, CKMBINDEX, TROPONINI in the last 168 hours. BNP: Invalid input(s): POCBNP CBG: Recent Labs  Lab 05/23/20 1139 05/23/20 1631 05/23/20 2014 05/24/20 0808 05/24/20 1055  GLUCAP 140* 243* 267* 226* 157*   D-Dimer No results for input(s): DDIMER in the last 72 hours. Hgb A1c No results for input(s): HGBA1C in the last 72 hours. Lipid Profile No results for input(s): CHOL, HDL, LDLCALC, TRIG, CHOLHDL, LDLDIRECT in the last 72 hours. Thyroid function studies No results for input(s): TSH, T4TOTAL, T3FREE, THYROIDAB in the last 72 hours.  Invalid input(s): FREET3 Anemia work up No results for input(s): VITAMINB12, FOLATE, FERRITIN, TIBC, IRON, RETICCTPCT in the last 72 hours. Urinalysis    Component Value Date/Time   COLORURINE STRAW (A) 05/17/2020 1950   APPEARANCEUR CLEAR (A) 05/17/2020 1950   APPEARANCEUR Hazy 08/11/2013 0729   LABSPEC 1.001 (  L) 05/17/2020 1950   LABSPEC 1.030 08/11/2013 0729   PHURINE 6.0 05/17/2020 1950   GLUCOSEU >=500 (A) 05/17/2020 1950   GLUCOSEU >=500 08/11/2013 0729   HGBUR NEGATIVE 05/17/2020 1950   BILIRUBINUR NEGATIVE 05/17/2020 1950   BILIRUBINUR Negative 08/11/2013 [place] 05/17/2020 1950   PROTEINUR 100 (A) 05/17/2020 1950   NITRITE NEGATIVE 05/17/2020 1950   LEUKOCYTESUR TRACE (A) 05/17/2020 1950   LEUKOCYTESUR Negative 08/11/2013 0729   Sepsis Labs Invalid input(s): PROCALCITONIN,  WBC,  LACTICIDVEN Microbiology Recent Results (from the past 240 hour(s))  Urine Culture      Status: Abnormal   Collection Time: 05/15/20  3:42 PM   Specimen: Urine, Random  Result Value Ref Range Status   Specimen Description   Final    URINE, RANDOM Performed at [facility], [address] [place], [place] [address]    Special Requests   Final    NONE Performed at [facility], [address] [place], [place] [address]    Culture >=100,000 COLONIES/mL STAPHYLOCOCCUS LUGDUNENSIS (A)  Final   Report Status 05/18/2020 FINAL  Final   Organism ID, Bacteria STAPHYLOCOCCUS LUGDUNENSIS (A)  Final      Susceptibility   Staphylococcus lugdunensis - MIC*    CIPROFLOXACIN <=0.5 SENSITIVE Sensitive     GENTAMICIN <=0.5 SENSITIVE Sensitive     NITROFURANTOIN <=16 SENSITIVE Sensitive     OXACILLIN 2 SENSITIVE Sensitive     TETRACYCLINE <=1 SENSITIVE Sensitive     VANCOMYCIN <=0.5 SENSITIVE Sensitive     TRIMETH/SULFA <=10 SENSITIVE Sensitive     CLINDAMYCIN <=0.25 SENSITIVE Sensitive     RIFAMPIN <=0.5 SENSITIVE Sensitive     Inducible Clindamycin NEGATIVE Sensitive     * >=100,000 COLONIES/mL STAPHYLOCOCCUS LUGDUNENSIS  Resp Panel by RT-PCR (Flu A&B, Covid) Nasopharyngeal Swab     Status: None   Collection Time: 05/17/20  4:22 PM   Specimen: Nasopharyngeal Swab; Nasopharyngeal(NP) swabs in vial transport medium  Result Value Ref Range Status   SARS Coronavirus 2 by RT PCR NEGATIVE NEGATIVE Final    Comment: (NOTE) SARS-CoV-2 target nucleic acids are NOT DETECTED.  The SARS-CoV-2 RNA is generally detectable in upper respiratory specimens during the acute phase of infection. The lowest concentration of SARS-CoV-2 viral copies this assay can detect is 138 copies/mL. A negative result does not preclude SARS-Cov-2 infection and should not be used as the sole basis for treatment or other patient management decisions. A negative result may occur with  improper specimen collection/handling, submission of specimen other than nasopharyngeal swab, presence of viral  mutation(s) within the areas targeted by this assay, and inadequate number of viral copies(<138 copies/mL). A negative result must be combined with clinical observations, patient history, and epidemiological information. The expected result is Negative.  Fact Sheet for Patients:  EntrepreneurPulse.com.au  Fact Sheet for Healthcare Providers:  IncredibleEmployment.be  This test is no t yet approved or cleared by the [place] FDA and  has been authorized for detection and/or diagnosis of SARS-CoV-2 by FDA under an Emergency Use Authorization (EUA). This EUA will remain  in effect (meaning this test can be used) for the duration of the COVID-19 declaration under Section 564(b)(1) of the Act, 21 U.S.C.section 360bbb-3(b)(1), unless the authorization is terminated  or revoked sooner.       Influenza A by PCR NEGATIVE NEGATIVE Final   Influenza B by PCR NEGATIVE NEGATIVE Final    Comment: (NOTE) The Xpert Xpress SARS-CoV-2/FLU/RSV plus assay is intended as  an aid in the diagnosis of influenza from Nasopharyngeal swab specimens and should not be used as a sole basis for treatment. Nasal washings and aspirates are unacceptable for Xpert Xpress SARS-CoV-2/FLU/RSV testing.  Fact Sheet for Patients: EntrepreneurPulse.com.au  Fact Sheet for Healthcare Providers: IncredibleEmployment.be  This test is not yet approved or cleared by the [place] FDA and has been authorized for detection and/or diagnosis of SARS-CoV-2 by FDA under an Emergency Use Authorization (EUA). This EUA will remain in effect (meaning this test can be used) for the duration of the COVID-19 declaration under Section 564(b)(1) of the Act, 21 U.S.C. section 360bbb-3(b)(1), unless the authorization is terminated or revoked.  Performed at [facility], [address] [place], [place] [address]   Culture, blood (Routine X 2) w  Reflex to ID Panel     Status: None   Collection Time: 05/18/20  3:05 AM   Specimen: BLOOD  Result Value Ref Range Status   Specimen Description BLOOD RIGHT ANTECUBITAL  Final   Special Requests   Final    BOTTLES DRAWN AEROBIC AND ANAEROBIC Blood Culture adequate volume   Culture   Final    NO GROWTH 5 DAYS Performed at [facility], [address], [place], [place] [address]    Report Status 05/23/2020 FINAL  Final  Culture, blood (Routine X 2) w Reflex to ID Panel     Status: None   Collection Time: 05/18/20  3:06 AM   Specimen: BLOOD  Result Value Ref Range Status   Specimen Description BLOOD RIGHT HAND  Final   Special Requests   Final    BOTTLES DRAWN AEROBIC AND ANAEROBIC Blood Culture adequate volume   Culture   Final    NO GROWTH 5 DAYS Performed at [facility], [address] [place],  [address]    Report Status 05/23/2020 FINAL  Final     Time coordinating discharge: Over 30 minutes  SIGNED:   Wyvonnia Dusky, MD  [facility] 05/24/2020, 12:10 PM Pager   If 7PM-7AM, please contact night-coverage

## 2020-05-24 NOTE — Progress Notes (Signed)
Patient ambulated about 160 ft around nurse' station without any difficulty.  Also had a large bowel movement with no complaints of nausea or vomiting.  Will continue to monitor.  Christene Slates

## 2020-05-24 NOTE — Progress Notes (Signed)
Pt will be discharging home. Per pt, her boyfriend will come to pick her up. DC documentation is completed.

## 2020-06-09 ENCOUNTER — Emergency Department
Admission: EM | Admit: 2020-06-09 | Discharge: 2020-06-09 | Disposition: A | Discharge status: Home / Self Care | Payer: Medicaid Other | Attending: Emergency Medicine | Admitting: Emergency Medicine

## 2020-06-09 ENCOUNTER — Encounter: Payer: Self-pay | Admitting: Emergency Medicine

## 2020-06-09 ENCOUNTER — Other Ambulatory Visit: Payer: Self-pay

## 2020-06-09 ENCOUNTER — Emergency Department: Payer: Medicaid Other

## 2020-06-09 DIAGNOSIS — M79609 Pain in unspecified limb: Secondary | ICD-10-CM | POA: Insufficient documentation

## 2020-06-09 DIAGNOSIS — R109 Unspecified abdominal pain: Secondary | ICD-10-CM | POA: Insufficient documentation

## 2020-06-09 DIAGNOSIS — E1122 Type 2 diabetes mellitus with diabetic chronic kidney disease: Secondary | ICD-10-CM | POA: Diagnosis not present

## 2020-06-09 DIAGNOSIS — N1831 Chronic kidney disease, stage 3a: Secondary | ICD-10-CM | POA: Diagnosis not present

## 2020-06-09 DIAGNOSIS — R112 Nausea with vomiting, unspecified: Secondary | ICD-10-CM

## 2020-06-09 DIAGNOSIS — I129 Hypertensive chronic kidney disease with stage 1 through stage 4 chronic kidney disease, or unspecified chronic kidney disease: Secondary | ICD-10-CM | POA: Insufficient documentation

## 2020-06-09 DIAGNOSIS — R140 Abdominal distension (gaseous): Secondary | ICD-10-CM | POA: Insufficient documentation

## 2020-06-09 DIAGNOSIS — Z794 Long term (current) use of insulin: Secondary | ICD-10-CM | POA: Insufficient documentation

## 2020-06-09 DIAGNOSIS — Z87891 Personal history of nicotine dependence: Secondary | ICD-10-CM | POA: Diagnosis not present

## 2020-06-09 DIAGNOSIS — Z79899 Other long term (current) drug therapy: Secondary | ICD-10-CM | POA: Diagnosis not present

## 2020-06-09 DIAGNOSIS — K146 Glossodynia: Secondary | ICD-10-CM | POA: Diagnosis not present

## 2020-06-09 DIAGNOSIS — R079 Chest pain, unspecified: Secondary | ICD-10-CM | POA: Insufficient documentation

## 2020-06-09 DIAGNOSIS — R197 Diarrhea, unspecified: Secondary | ICD-10-CM | POA: Diagnosis not present

## 2020-06-09 LAB — CBC
HCT: 33.8 % — ABNORMAL LOW (ref 36.0–46.0)
Hemoglobin: 12 g/dL (ref 12.0–15.0)
MCH: 29.6 pg (ref 26.0–34.0)
MCHC: 35.5 g/dL (ref 30.0–36.0)
MCV: 83.3 fL (ref 80.0–100.0)
Platelets: 236 10*3/uL (ref 150–400)
RBC: 4.06 MIL/uL (ref 3.87–5.11)
RDW: 12 % (ref 11.5–15.5)
WBC: 7.3 10*3/uL (ref 4.0–10.5)
nRBC: 0 % (ref 0.0–0.2)

## 2020-06-09 LAB — COMPREHENSIVE METABOLIC PANEL
ALT: 35 U/L (ref 0–44)
AST: 43 U/L — ABNORMAL HIGH (ref 15–41)
Albumin: 3.6 g/dL (ref 3.5–5.0)
Alkaline Phosphatase: 177 U/L — ABNORMAL HIGH (ref 38–126)
Anion gap: 9 (ref 5–15)
BUN: 15 mg/dL (ref 8–23)
CO2: 23 mmol/L (ref 22–32)
Calcium: 8.5 mg/dL — ABNORMAL LOW (ref 8.9–10.3)
Chloride: 105 mmol/L (ref 98–111)
Creatinine, Ser: 1.44 mg/dL — ABNORMAL HIGH (ref 0.44–1.00)
GFR, Estimated: 41 mL/min — ABNORMAL LOW (ref >60)
Glucose, Bld: 240 mg/dL — ABNORMAL HIGH (ref 70–99)
Potassium: 4 mmol/L (ref 3.5–5.1)
Sodium: 137 mmol/L (ref 135–145)
Total Bilirubin: 0.7 mg/dL (ref 0.3–1.2)
Total Protein: 7.1 g/dL (ref 6.5–8.1)

## 2020-06-09 LAB — URINALYSIS, COMPLETE (UACMP) WITH MICROSCOPIC
Bilirubin Urine: NEGATIVE
Glucose, UA: >=500 mg/dL — AB
Ketones, ur: NEGATIVE mg/dL
Leukocytes,Ua: NEGATIVE
Nitrite: NEGATIVE
Protein, ur: >=300 mg/dL — AB
Specific Gravity, Urine: 1.007 (ref 1.005–1.030)
pH: 6 (ref 5.0–8.0)

## 2020-06-09 LAB — TROPONIN I (HIGH SENSITIVITY)
Troponin I (High Sensitivity): 5 ng/L (ref <18)
Troponin I (High Sensitivity): 6 ng/L (ref <18)

## 2020-06-09 LAB — LIPASE, BLOOD: Lipase: 69 U/L — ABNORMAL HIGH (ref 11–51)

## 2020-06-09 MED ORDER — ONDANSETRON HCL 4 MG/2ML IJ SOLN
4.0000 mg | Freq: Once | INTRAMUSCULAR | Status: AC
  Administered 2020-06-09: 4 mg via INTRAVENOUS
  Filled 2020-06-09: qty 2

## 2020-06-09 MED ORDER — DIPHENHYDRAMINE HCL 50 MG/ML IJ SOLN
25.0000 mg | Freq: Once | INTRAMUSCULAR | Status: AC
  Administered 2020-06-09: 25 mg via INTRAVENOUS
  Filled 2020-06-09: qty 1

## 2020-06-09 MED ORDER — FAMOTIDINE IN NACL 20-0.9 MG/50ML-% IV SOLN
20.0000 mg | Freq: Once | INTRAVENOUS | Status: AC
  Administered 2020-06-09: 20 mg via INTRAVENOUS
  Filled 2020-06-09: qty 50

## 2020-06-09 MED ORDER — SODIUM CHLORIDE 0.9 % IV BOLUS
500.0000 mL | Freq: Once | INTRAVENOUS | Status: AC
  Administered 2020-06-09: 500 mL via INTRAVENOUS

## 2020-06-09 NOTE — Discharge Instructions (Addendum)
Continue take your normal medications at home as prescribed, including the Zofran for nausea and omeprazole for stomach acid.  Return to the ER for new, worsening, or persistent severe nausea or vomiting, abdominal pain, fever, chest pain, difficulty breathing, or any other new or worsening symptoms that concern you.

## 2020-06-09 NOTE — ED Provider Notes (Signed)
[facility] Emergency Department Provider Note ____________________________________________   Event Date/Time   First MD Initiated Contact with Patient 06/09/20 [id number]     (approximate)  I have reviewed the triage vital signs and the nursing notes.   HISTORY  Chief Complaint Abdominal Pain (/), Diarrhea, and Chest Pain    HPI Karen [name] is a 65 y.o. female with PMH as noted below including diabetes, pancreatitis, hypertension, and CKD who presents with a couple complaints, primarily nausea and vomiting since yesterday.  She reports multiple episodes of nonbloody, nonbilious vomiting since yesterday, most recently this morning.  She also has had some diarrhea.  The patient has multiple concurrent complaints including headache, chest pain, tongue discomfort which feels like swelling, abdominal bloating, and leg cramping.  She states that the abdominal discomfort and headache feels similar to when she has had pancreatitis in the past, and she was recently admitted although she states she was doing well until yesterday.  Past Medical History:  Diagnosis Date  . Alcoholic pancreatitis   . Asthma   . Depression   . Diabetes mellitus without complication ([place])   . DKA (diabetic ketoacidoses) 02/17/2015  . Emphysematous cystitis   . Hypercholesteremia   . Hypertension   . Hypokalemia     Patient Active Problem List   Diagnosis Date Noted  . Epigastric pain 05/18/2020  . Acute cystitis 05/18/2020  . Intractable nausea and vomiting 05/17/2020  . Stroke-like symptom 11/15/2019  . Itching   . Diabetic polyneuropathy associated with type 2 diabetes mellitus ([place])   . Leukocytosis 06/13/2019  . Chronic kidney disease (CKD), stage II (mild)   . CKD (chronic kidney disease), stage IIIa 03/15/2019  . HTN (hypertension) 03/15/2019  . Type II diabetes mellitus with renal manifestations ([place]) 03/15/2019  . Headache 02/26/2019  . Dizziness 02/26/2019  .  Hyperglycemia due to type 2 diabetes mellitus ([place]) 01/30/2019  . Acute pancreatitis 01/30/2019  . Constipation   . Acute on chronic pancreatitis ([place]) 01/25/2019  . Acute pain of left shoulder   . Adhesive capsulitis of left shoulder   . Abnormal LFTs 01/24/2019  . Cough 01/24/2019  . Hypertension   . HLD (hyperlipidemia)   . Type 2 diabetes mellitus with hyperlipidemia ([place])   . GERD (gastroesophageal reflux disease)   . Acute kidney injury superimposed on CKD ([place])   . Pancreatitis, recurrent   . Chronic pancreatitis ([place]) 09/25/2018  . 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 Coffee ground emesis   . Upper GI bleed 11/11/2017  . Pancreatitis, acute 01/03/2017  . ETOH abuse 01/03/2017  . UTI (urinary tract infection) 01/03/2017  . Nausea and vomiting   . Esophageal candidiasis ([place])   . Intractable vomiting with nausea   . Hypertensive urgency 04/21/2016  . Abdominal pain   . Nausea vomiting and diarrhea   . Smoker   . Poorly controlled type 2 diabetes mellitus ([place])   . Acute renal insufficiency 10/29/2015  . Elevated troponin 10/29/2015  . Urinary tract infection without hematuria 10/29/2015  . Left flank pain 10/29/2015  . Malignant essential hypertension 10/29/2015  . DKA (diabetic ketoacidoses) 02/17/2015  . Emphysematous cystitis     Past Surgical History:  Procedure Laterality Date  . ABDOMINAL HYSTERECTOMY  1996  . APPENDECTOMY  1997  . ESOPHAGOGASTRODUODENOSCOPY N/A 11/12/2017   Procedure: ESOPHAGOGASTRODUODENOSCOPY (EGD);  Surgeon: Lin Landsman, MD;  Location[facility] ENDOSCOPY;  Service: Gastroenterology;  Laterality: N/A;  . ESOPHAGOGASTRODUODENOSCOPY N/A 09/29/2018   Procedure: ESOPHAGOGASTRODUODENOSCOPY (  EGD);  Surgeon: Toledo, Benay Pike, MD;  Location: [facility] ENDOSCOPY;  Service: Gastroenterology;  Laterality: N/A;  . ESOPHAGOGASTRODUODENOSCOPY (EGD) WITH PROPOFOL N/A 04/24/2016   Procedure: ESOPHAGOGASTRODUODENOSCOPY (EGD) WITH PROPOFOL;  Surgeon:  [name], MD;  Location: [facility] ENDOSCOPY;  Service: Endoscopy;  Laterality: N/A;  . ESOPHAGOGASTRODUODENOSCOPY (EGD) WITH PROPOFOL N/A 01/28/2019   Procedure: ESOPHAGOGASTRODUODENOSCOPY (EGD) WITH PROPOFOL;  Surgeon: Toledo, Benay Pike, MD;  Location: [facility] ENDOSCOPY;  Service: Gastroenterology;  Laterality: N/A;  . EYE SURGERY    . HAND SURGERY  1988  . THYROID SURGERY  2013    Prior to Admission medications   Medication Sig Start Date End Date Taking? Authorizing Provider  amLODipine (NORVASC) 10 MG tablet Take 1 tablet (10 mg total) by mouth daily. 11/17/19   Pahwani, Michell Heinrich, MD  ARIPiprazole (ABILIFY) 5 MG tablet Take 1 tablet (5 mg total) by mouth daily. 11/17/19   Pahwani, Michell Heinrich, MD  cetirizine (ZYRTEC) 5 MG tablet Take 1 tablet (5 mg total) by mouth daily. 11/17/19 12/17/19  Pahwani, Michell Heinrich, MD  cyclobenzaprine (FLEXERIL) 5 MG tablet Take 5 mg by mouth 2 (two) times daily as needed for muscle spasms.     [provider]  docusate sodium (COLACE) 100 MG capsule Take 1 capsule (100 mg total) by mouth 2 (two) times daily. 11/17/19   Pahwani, Michell Heinrich, MD  famotidine (PEPCID) 20 MG tablet Take 1 tablet (20 mg total) by mouth 2 (two) times daily. 11/17/19   Pahwani, Michell Heinrich, MD  HYDROcodone-acetaminophen (NORCO/VICODIN) 5-325 MG tablet One tablet every 6 hours as needed for severe pain. 11/17/19   Pahwani, Michell Heinrich, MD  insulin aspart (NOVOLOG) 100 UNIT/ML FlexPen Inject 2 Units into the skin 3 (three) times daily with meals. 11/17/19   Pahwani, Michell Heinrich, MD  insulin glargine (LANTUS) 100 UNIT/ML injection Inject 0.15 mLs (15 Units total) into the skin at bedtime. 05/24/20   Wyvonnia Dusky, MD  Insulin Pen Needle 29G X 12MM MISC 1 Dose by Does not apply route 3 (three) times daily before meals. 06/18/19   Loletha Grayer, MD  lipase/protease/amylase (CREON) 36000 UNITS CPEP capsule Take 2 capsules (72,000 Units total) by mouth 3 (three) times daily with meals. 11/17/19   Pahwani,  Michell Heinrich, MD  omeprazole (PRILOSEC) 40 MG capsule Take 1 capsule (40 mg total) by mouth in the morning and at bedtime. 11/17/19   Pahwani, Michell Heinrich, MD  ondansetron (ZOFRAN ODT) 4 MG disintegrating tablet Take 1 tablet (4 mg total) by mouth every 8 (eight) hours as needed for nausea or vomiting. 04/23/20   Blake Divine, MD  ondansetron (ZOFRAN ODT) 4 MG disintegrating tablet Take 1 tablet (4 mg total) by mouth every 8 (eight) hours as needed for nausea or vomiting. 05/15/20   Vladimir Crofts, MD  Polyethylene Glycol 3350 POWD Take 17 g by mouth at bedtime. Mix 17 grams in 4-8 oz of liquid and drink     [provider]  pregabalin (LYRICA) 75 MG capsule Take 1 capsule (75 mg total) by mouth 2 (two) times daily. 11/17/19   Pahwani, Michell Heinrich, MD  rosuvastatin (CRESTOR) 20 MG tablet Take 1 tablet (20 mg total) by mouth daily. 11/17/19   Pahwani, Michell Heinrich, MD  simethicone (MYLICON) 80 MG chewable tablet Chew 1 tablet (80 mg total) by mouth 4 (four) times daily. Patient taking differently: Chew 80 mg by mouth as needed.  06/18/19   Loletha Grayer, MD  triamcinolone cream (KENALOG) 0.5 % Apply twice  a day to back of thighs 06/18/19   Loletha Grayer, MD  VASCEPA 0.5 g CAPS Take 4 capsules by mouth in the morning and at bedtime. 11/17/19   Pahwani, Michell Heinrich, MD  zolpidem (AMBIEN) 10 MG tablet Take 1 tablet by mouth at bedtime. 05/10/19   [provider]  GLUCERNA (GLUCERNA) LIQD Take 237 mLs by mouth 3 (three) times daily between meals. 02/19/15 02/27/15  Bettey Costa, MD    Allergies Penicillins, Reglan [metoclopramide], Fentanyl, and Percocet [oxycodone-acetaminophen]  Family History  Problem Relation Age of Onset  . Diabetes Mother     Social History Social History   Tobacco Use  . Smoking status: Former Research scientist (life sciences)  . Smokeless tobacco: Never Used  . Tobacco comment: quit 7 years ago   Vaping Use  . Vaping Use: Never used  Substance Use Topics  . Alcohol use: Not Currently    Comment:  quit drinking 2 years ago   . Drug use: No    Review of Systems  Constitutional: No fever. Eyes: No redness. ENT: No sore throat.  Positive for tongue discomfort. Cardiovascular: Positive for chest pain. Respiratory: Positive for shortness of breath. Gastrointestinal: Positive for nausea, vomiting, and diarrhea.  Genitourinary: Negative for dysuria.  Musculoskeletal: Negative for back pain.  Positive for leg cramps. Skin: Negative for rash. Neurological: Positive for headaches, negative for focal weakness or numbness.   ____________________________________________   PHYSICAL EXAM:  VITAL SIGNS: ED Triage Vitals  Enc Vitals Group     BP 06/09/20 0729 (!) 177/109     Pulse Rate 06/09/20 0729 82     Resp 06/09/20 0729 18     Temp 06/09/20 0729 98.2 F (36.8 C)     Temp Source 06/09/20 0729 Oral     SpO2 06/09/20 0729 100 %     Weight 06/09/20 0733 126 lb (57.2 kg)     Height --      Head Circumference --      Peak Flow --      Pain Score --      Pain Loc --      Pain Edu? --      Excl. in [place]? --     Constitutional: Alert and oriented. Well appearing and in no acute distress. Eyes: Conjunctivae are normal.  No scleral icterus. Head: Atraumatic. Nose: No congestion/rhinnorhea. Mouth/Throat: Mucous membranes are moist.  Tongue appears normal.  Oropharynx clear with no swelling.  Normal voice. Neck: Normal range of motion.  Cardiovascular: Normal rate, regular rhythm. Grossly normal heart sounds.  Good peripheral circulation. Respiratory: Normal respiratory effort.  No retractions. Lungs CTAB. Gastrointestinal: Soft with mild epigastric discomfort but no focal tenderness or peritoneal signs.  No distention.  Genitourinary: No flank tenderness. Musculoskeletal: No lower extremity edema.  Extremities warm and well perfused.  Neurologic:  Normal speech and language. No gross focal neurologic deficits are appreciated.  Skin:  Skin is warm and dry. No rash  noted. Psychiatric: Mood and affect are normal. Speech and behavior are normal.  ____________________________________________   LABS (all labs ordered are listed, but only abnormal results are displayed)  Labs Reviewed  LIPASE, BLOOD - Abnormal; Notable for the following components:      Result Value   Lipase 69 (*)    All other components within normal limits  COMPREHENSIVE METABOLIC PANEL - Abnormal; Notable for the following components:   Glucose, Bld 240 (*)    Creatinine, Ser 1.44 (*)    Calcium 8.5 (*)  AST 43 (*)    Alkaline Phosphatase 177 (*)    GFR, Estimated 41 (*)    All other components within normal limits  CBC - Abnormal; Notable for the following components:   HCT 33.8 (*)    All other components within normal limits  URINALYSIS, COMPLETE (UACMP) WITH MICROSCOPIC - Abnormal; Notable for the following components:   Color, Urine STRAW (*)    APPearance CLEAR (*)    Glucose, UA >=500 (*)    Hgb urine dipstick SMALL (*)    Protein, ur >=300 (*)    Bacteria, UA RARE (*)    All other components within normal limits  TROPONIN I (HIGH SENSITIVITY)  TROPONIN I (HIGH SENSITIVITY)   ____________________________________________  EKG  ED ECG REPORT I, Arta Silence, the attending physician, personally viewed and interpreted this ECG.  Date: 06/09/2020 EKG Time: 0735 Rate: 82 Rhythm: normal sinus rhythm QRS Axis: normal Intervals: normal ST/T Wave abnormalities: Nonspecific ST abnormalities Narrative Interpretation: Nonspecific abnormalities with no evidence of acute ischemia; no significant change when compared to EKG of 05/15/2020  ____________________________________________  RADIOLOGY  Chest x-ray interpreted by me shows no focal consolidation or edema  ____________________________________________   PROCEDURES  Procedure(s) performed: No  Procedures  Critical Care performed: No ____________________________________________   INITIAL  IMPRESSION / ASSESSMENT AND PLAN / ED COURSE  Pertinent labs & imaging results that were available during my care of the patient were reviewed by me and considered in my medical decision making (see chart for details).  65 year old female with PMH as noted above including diabetes, CKD, and pancreatitis presents with nausea and vomiting since yesterday along with multiple other complaints including headache, subjective tongue swelling, leg cramping, and generalized weakness.  I reviewed the past medical records in [place].  The patient was most recent admitted last month and discharged on 5/20, with intractable nausea and vomiting.  She had a slightly elevated lipase and an abnormal urinalysis and was diagnosed with pancreatitis and UTI.  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  On exam the patient is overall well-appearing.  Her vital signs are normal except for hypertension.  The abdomen is soft with some mild epigastric discomfort.  The tongue does not appear significantly swollen on my exam, there is no facial swelling, urticaria, and the oropharynx is clear with a normal voice and no stridor.  Exam is otherwise unremarkable.  Differential includes recurrent pancreatitis, gastritis, PUD, gastroenteritis, or less likely colitis, SBO, recurrent UTI.  I have a low suspicion for cardiac etiology.  We will obtain basic, hepatobiliary, cardiac labs, chest x-ray, give fluids, symptomatic treatment with Zofran and Pepcid, and reassess.  ----------------------------------------- 11:16 AM on 06/09/2020 -----------------------------------------  The patient states that she is feeling significantly better.  She is able to tolerate p.o. with no recurrent vomiting in the ED.  The lab work-up overall is reassuring.  Lipase is unchanged from when she left the hospital, alk phos is trending downwards, and the urinalysis does not show significant findings  to suggest active UTI.  Troponins are negative x2.  Based on the results of the work-up and improved symptoms, there is no indication for imaging.  At this time, the patient is stable for discharge home.  I counseled her on the results of the work-up.  She has Zofran and omeprazole at home.  Return precautions given, and she expresses understanding.  ____________________________________________   FINAL CLINICAL IMPRESSION(S) / ED DIAGNOSES  Final diagnoses:  Non-intractable vomiting with nausea, unspecified vomiting type      NEW MEDICATIONS STARTED DURING THIS VISIT:  Discharge Medication List as of 06/09/2020 11:15 AM       Note:  This document was prepared using Dragon voice recognition software and may include unintentional dictation errors.    Arta Silence, MD 06/09/20 1121

## 2020-06-09 NOTE — ED Notes (Signed)
Pt does report hx of DM - states sugars have been <200

## 2020-06-09 NOTE — ED Triage Notes (Signed)
Pt in w/upper abdominal pain, hx of pancreatitis. Also having n/v/d since yesterday, along w/some central cp. Reports some leg cramping and HA as well. No fevers or sob reported

## 2020-06-09 NOTE — ED Notes (Signed)
Pt taken for xray

## 2020-06-10 ENCOUNTER — Emergency Department
Admission: EM | Admit: 2020-06-10 | Discharge: 2020-06-10 | Disposition: A | Discharge status: Home / Self Care | Payer: Medicaid Other | Attending: Emergency Medicine | Admitting: Emergency Medicine

## 2020-06-10 ENCOUNTER — Ambulatory Visit: Payer: Self-pay | Admitting: *Deleted

## 2020-06-10 ENCOUNTER — Emergency Department: Payer: Medicaid Other

## 2020-06-10 ENCOUNTER — Other Ambulatory Visit: Payer: Self-pay

## 2020-06-10 ENCOUNTER — Encounter: Payer: Self-pay | Admitting: Emergency Medicine

## 2020-06-10 DIAGNOSIS — R197 Diarrhea, unspecified: Secondary | ICD-10-CM | POA: Diagnosis not present

## 2020-06-10 DIAGNOSIS — R0602 Shortness of breath: Secondary | ICD-10-CM | POA: Diagnosis not present

## 2020-06-10 DIAGNOSIS — R109 Unspecified abdominal pain: Secondary | ICD-10-CM | POA: Diagnosis present

## 2020-06-10 DIAGNOSIS — E1143 Type 2 diabetes mellitus with diabetic autonomic (poly)neuropathy: Secondary | ICD-10-CM | POA: Diagnosis not present

## 2020-06-10 DIAGNOSIS — E1122 Type 2 diabetes mellitus with diabetic chronic kidney disease: Secondary | ICD-10-CM | POA: Diagnosis not present

## 2020-06-10 DIAGNOSIS — R112 Nausea with vomiting, unspecified: Secondary | ICD-10-CM | POA: Insufficient documentation

## 2020-06-10 DIAGNOSIS — Z87891 Personal history of nicotine dependence: Secondary | ICD-10-CM | POA: Diagnosis not present

## 2020-06-10 DIAGNOSIS — R519 Headache, unspecified: Secondary | ICD-10-CM | POA: Diagnosis not present

## 2020-06-10 DIAGNOSIS — I129 Hypertensive chronic kidney disease with stage 1 through stage 4 chronic kidney disease, or unspecified chronic kidney disease: Secondary | ICD-10-CM | POA: Insufficient documentation

## 2020-06-10 DIAGNOSIS — Z79899 Other long term (current) drug therapy: Secondary | ICD-10-CM | POA: Diagnosis not present

## 2020-06-10 DIAGNOSIS — Z794 Long term (current) use of insulin: Secondary | ICD-10-CM | POA: Diagnosis not present

## 2020-06-10 DIAGNOSIS — E1110 Type 2 diabetes mellitus with ketoacidosis without coma: Secondary | ICD-10-CM | POA: Diagnosis not present

## 2020-06-10 DIAGNOSIS — N1831 Chronic kidney disease, stage 3a: Secondary | ICD-10-CM | POA: Diagnosis not present

## 2020-06-10 DIAGNOSIS — N3000 Acute cystitis without hematuria: Secondary | ICD-10-CM | POA: Insufficient documentation

## 2020-06-10 DIAGNOSIS — K219 Gastro-esophageal reflux disease without esophagitis: Secondary | ICD-10-CM | POA: Insufficient documentation

## 2020-06-10 DIAGNOSIS — I10 Essential (primary) hypertension: Secondary | ICD-10-CM

## 2020-06-10 LAB — CBC WITH DIFFERENTIAL/PLATELET
Abs Immature Granulocytes: 0.03 10*3/uL (ref 0.00–0.07)
Basophils Absolute: 0.1 10*3/uL (ref 0.0–0.1)
Basophils Relative: 1 %
Eosinophils Absolute: 0.1 10*3/uL (ref 0.0–0.5)
Eosinophils Relative: 1 %
HCT: 33.3 % — ABNORMAL LOW (ref 36.0–46.0)
Hemoglobin: 11.9 g/dL — ABNORMAL LOW (ref 12.0–15.0)
Immature Granulocytes: 0 %
Lymphocytes Relative: 31 %
Lymphs Abs: 2.3 10*3/uL (ref 0.7–4.0)
MCH: 29.6 pg (ref 26.0–34.0)
MCHC: 35.7 g/dL (ref 30.0–36.0)
MCV: 82.8 fL (ref 80.0–100.0)
Monocytes Absolute: 0.5 10*3/uL (ref 0.1–1.0)
Monocytes Relative: 7 %
Neutro Abs: 4.4 10*3/uL (ref 1.7–7.7)
Neutrophils Relative %: 60 %
Platelets: 228 10*3/uL (ref 150–400)
RBC: 4.02 MIL/uL (ref 3.87–5.11)
RDW: 11.9 % (ref 11.5–15.5)
WBC: 7.3 10*3/uL (ref 4.0–10.5)
nRBC: 0 % (ref 0.0–0.2)

## 2020-06-10 LAB — COMPREHENSIVE METABOLIC PANEL
ALT: 33 U/L (ref 0–44)
AST: 33 U/L (ref 15–41)
Albumin: 3.7 g/dL (ref 3.5–5.0)
Alkaline Phosphatase: 175 U/L — ABNORMAL HIGH (ref 38–126)
Anion gap: 9 (ref 5–15)
BUN: 14 mg/dL (ref 8–23)
CO2: 25 mmol/L (ref 22–32)
Calcium: 8.9 mg/dL (ref 8.9–10.3)
Chloride: 102 mmol/L (ref 98–111)
Creatinine, Ser: 1.24 mg/dL — ABNORMAL HIGH (ref 0.44–1.00)
GFR, Estimated: 49 mL/min — ABNORMAL LOW (ref >60)
Glucose, Bld: 247 mg/dL — ABNORMAL HIGH (ref 70–99)
Potassium: 3.8 mmol/L (ref 3.5–5.1)
Sodium: 136 mmol/L (ref 135–145)
Total Bilirubin: 0.6 mg/dL (ref 0.3–1.2)
Total Protein: 7.3 g/dL (ref 6.5–8.1)

## 2020-06-10 LAB — URINALYSIS, COMPLETE (UACMP) WITH MICROSCOPIC
Bilirubin Urine: NEGATIVE
Glucose, UA: >=500 mg/dL — AB
Hgb urine dipstick: NEGATIVE
Ketones, ur: NEGATIVE mg/dL
Nitrite: NEGATIVE
Protein, ur: 100 mg/dL — AB
Specific Gravity, Urine: 1.001 — ABNORMAL LOW (ref 1.005–1.030)
Squamous Epithelial / HPF: NONE SEEN (ref 0–5)
pH: 7 (ref 5.0–8.0)

## 2020-06-10 LAB — TROPONIN I (HIGH SENSITIVITY): Troponin I (High Sensitivity): 7 ng/L (ref <18)

## 2020-06-10 LAB — TYPE AND SCREEN
ABO/RH(D): B POS
Antibody Screen: NEGATIVE

## 2020-06-10 LAB — LIPASE, BLOOD: Lipase: 59 U/L — ABNORMAL HIGH (ref 11–51)

## 2020-06-10 MED ORDER — LACTATED RINGERS IV BOLUS
1000.0000 mL | Freq: Once | INTRAVENOUS | Status: AC
  Administered 2020-06-10: 1000 mL via INTRAVENOUS

## 2020-06-10 MED ORDER — CIPROFLOXACIN IN D5W 400 MG/200ML IV SOLN
400.0000 mg | Freq: Once | INTRAVENOUS | Status: AC
  Administered 2020-06-10: 400 mg via INTRAVENOUS
  Filled 2020-06-10: qty 200

## 2020-06-10 MED ORDER — DICYCLOMINE HCL 10 MG PO CAPS
10.0000 mg | ORAL_CAPSULE | ORAL | Status: AC
  Administered 2020-06-10: 10 mg via ORAL
  Filled 2020-06-10: qty 1

## 2020-06-10 MED ORDER — PANTOPRAZOLE SODIUM 40 MG IV SOLR
40.0000 mg | Freq: Once | INTRAVENOUS | Status: AC
  Administered 2020-06-10: 40 mg via INTRAVENOUS
  Filled 2020-06-10: qty 40

## 2020-06-10 MED ORDER — ONDANSETRON HCL 4 MG/2ML IJ SOLN
4.0000 mg | Freq: Once | INTRAMUSCULAR | Status: AC
  Administered 2020-06-10: 4 mg via INTRAVENOUS
  Filled 2020-06-10: qty 2

## 2020-06-10 MED ORDER — INSULIN ASPART 100 UNIT/ML IJ SOLN: 0.0000 [IU] | INTRAMUSCULAR | Status: DC

## 2020-06-10 MED ORDER — HYDROMORPHONE HCL 1 MG/ML IJ SOLN
0.5000 mg | Freq: Once | INTRAMUSCULAR | Status: AC
  Administered 2020-06-10: 0.5 mg via INTRAVENOUS
  Filled 2020-06-10: qty 1

## 2020-06-10 MED ORDER — SULFAMETHOXAZOLE-TRIMETHOPRIM 800-160 MG PO TABS: 1.0000 | ORAL_TABLET | Freq: Two times a day (BID) | ORAL | 0 refills | Status: AC

## 2020-06-10 NOTE — Telephone Encounter (Signed)
Pt called with complaints of dizziness and weakness; the pt says she was seen in the ED on 06/09/20; the pt says she is also having black diarrhea, confusion, nausea, and difficulty breathing at times; reviewed discharge instructions with pt; recommendations made per nurse triage protocol; she verbalized understanding and will go to the ED  Reason for Disposition . Difficulty breathing  Answer Assessment - Initial Assessment Questions 1. DESCRIPTION: "Describe your dizziness."      2. LIGHTHEADED: "Do you feel lightheaded?" (e.g., somewhat faint, woozy, weak upon standing)     3. VERTIGO: "Do you feel like either you or the room is spinning or tilting?" (i.e. vertigo)  4. SEVERITY: "How bad is it?"  "Do you feel like you are going to faint?" "Can you stand and walk?"   - MILD: Feels slightly dizzy, but walking normally.   - MODERATE: Feels unsteady when walking, but not falling; interferes with normal activities (e.g., school, work).   - SEVERE: Unable to walk without falling, or requires assistance to walk without falling; feels like passing out now.     5. ONSET:  "When did the dizziness begin?"     Pt seen in ED 06/09/20 6. AGGRAVATING FACTORS: "Does anything make it worse?" (e.g., standing, change in head position)    7. HEART RATE: "Can you tell me your heart rate?" "How many beats in 15 seconds?"  (Note: not all patients can do this)       increased 8. CAUSE: "What do you think is causing the dizziness?"    9. RECURRENT SYMPTOM: "Have you had dizziness before?" If Yes, ask: "When was the last time?" "What happened that time?"     Seen in ED 06/09/20 10. OTHER SYMPTOMS: "Do you have any other symptoms?" (e.g., fever, chest pain, vomiting, diarrhea, bleeding)       Black diarrhea, confusion, weakness, intermittent difficulty breathing, nausea  11. PREGNANCY: "Is there any chance you are pregnant?" "When was your last menstrual period?"  Protocols used: DIZZINESS [facility]

## 2020-06-10 NOTE — ED Notes (Signed)
This RN unable to obtain IV access x2. Pt being transported to CT. Will obtain access upon return

## 2020-06-10 NOTE — ED Provider Notes (Signed)
[facility] Emergency Department Provider Note  ____________________________________________   Event Date/Time   First MD Initiated Contact with Patient 06/10/20 [phone]     (approximate)  I have reviewed the triage vital signs and the nursing notes.   HISTORY  Chief Complaint Rectal Bleeding, Weakness, and Shortness of Breath   HPI Karen FERRAZ is a 65 y.o. female a past medical history of asthma, depression, DM, HTN, HDL, and alcoholic pancreatitis coming reported not had any alcohol in at least 2 years who presents for assessment of some continued vomiting, crampy abdominal pain and dark stools that she states began the day before yesterday.  She states she was seen emergency room yesterday and got some Pepcid and Zofran and was feeling a little better but started feeling worse again today with continued diarrhea and some vomiting.  She states she is feeling very weak and her abdominal crampiness is gotten worse as well.  She endorses little bit of a headache today.  She states she think she may feel short of breath she has vomiting but not in between.  No chest pain, cough, fevers, earache, sore throat, urinary symptoms rash or extremity pain.  No other acute concerns at this time.  No clearly getting aggravating factors.         Past Medical History:  Diagnosis Date  . Alcoholic pancreatitis   . Asthma   . Depression   . Diabetes mellitus without complication ([place])   . DKA (diabetic ketoacidoses) 02/17/2015  . Emphysematous cystitis   . Hypercholesteremia   . Hypertension   . Hypokalemia     Patient Active Problem List   Diagnosis Date Noted  . Epigastric pain 05/18/2020  . Acute cystitis 05/18/2020  . Intractable nausea and vomiting 05/17/2020  . Stroke-like symptom 11/15/2019  . Itching   . Diabetic polyneuropathy associated with type 2 diabetes mellitus ([place])   . Leukocytosis 06/13/2019  . Chronic kidney disease (CKD), stage II (mild)   .  CKD (chronic kidney disease), stage IIIa 03/15/2019  . HTN (hypertension) 03/15/2019  . Type II diabetes mellitus with renal manifestations ([place]) 03/15/2019  . Headache 02/26/2019  . Dizziness 02/26/2019  . Hyperglycemia due to type 2 diabetes mellitus ([place]) 01/30/2019  . Acute pancreatitis 01/30/2019  . Constipation   . Acute on chronic pancreatitis ([place]) 01/25/2019  . Acute pain of left shoulder   . Adhesive capsulitis of left shoulder   . Abnormal LFTs 01/24/2019  . Cough 01/24/2019  . Hypertension   . HLD (hyperlipidemia)   . Type 2 diabetes mellitus with hyperlipidemia ([place])   . GERD (gastroesophageal reflux disease)   . Acute kidney injury superimposed on CKD ([place])   . Pancreatitis, recurrent   . Chronic pancreatitis ([place]) 09/25/2018  . 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 Coffee ground emesis   . Upper GI bleed 11/11/2017  . Pancreatitis, acute 01/03/2017  . ETOH abuse 01/03/2017  . UTI (urinary tract infection) 01/03/2017  . Nausea and vomiting   . Esophageal candidiasis ([place])   . Intractable vomiting with nausea   . Hypertensive urgency 04/21/2016  . Abdominal pain   . Nausea vomiting and diarrhea   . Smoker   . Poorly controlled type 2 diabetes mellitus ([place])   . Acute renal insufficiency 10/29/2015  . Elevated troponin 10/29/2015  . Urinary tract infection without hematuria 10/29/2015  . Left flank pain 10/29/2015  . Malignant essential hypertension 10/29/2015  . DKA (diabetic ketoacidoses) 02/17/2015  . Emphysematous  cystitis     Past Surgical History:  Procedure Laterality Date  . ABDOMINAL HYSTERECTOMY  1996  . APPENDECTOMY  1997  . ESOPHAGOGASTRODUODENOSCOPY N/A 11/12/2017   Procedure: ESOPHAGOGASTRODUODENOSCOPY (EGD);  Surgeon: Lin Landsman, MD;  Location[facility] ENDOSCOPY;  Service: Gastroenterology;  Laterality: N/A;  . ESOPHAGOGASTRODUODENOSCOPY N/A 09/29/2018   Procedure: ESOPHAGOGASTRODUODENOSCOPY (EGD);  Surgeon: Toledo, Benay Pike, MD;   Location: [facility] ENDOSCOPY;  Service: Gastroenterology;  Laterality: N/A;  . ESOPHAGOGASTRODUODENOSCOPY (EGD) WITH PROPOFOL N/A 04/24/2016   Procedure: ESOPHAGOGASTRODUODENOSCOPY (EGD) WITH PROPOFOL;  Surgeon: [name], MD;  Location: [facility] ENDOSCOPY;  Service: Endoscopy;  Laterality: N/A;  . ESOPHAGOGASTRODUODENOSCOPY (EGD) WITH PROPOFOL N/A 01/28/2019   Procedure: ESOPHAGOGASTRODUODENOSCOPY (EGD) WITH PROPOFOL;  Surgeon: Toledo, Benay Pike, MD;  Location: [facility] ENDOSCOPY;  Service: Gastroenterology;  Laterality: N/A;  . EYE SURGERY    . HAND SURGERY  1988  . THYROID SURGERY  2013    Prior to Admission medications   Medication Sig Start Date End Date Taking? Authorizing Provider  sulfamethoxazole-trimethoprim (BACTRIM DS) 800-160 MG tablet Take 1 tablet by mouth 2 (two) times daily for 7 days. 06/10/20 06/17/20 Yes Lucrezia Starch, MD  amLODipine (NORVASC) 10 MG tablet Take 1 tablet (10 mg total) by mouth daily. 11/17/19   Pahwani, Michell Heinrich, MD  ARIPiprazole (ABILIFY) 5 MG tablet Take 1 tablet (5 mg total) by mouth daily. 11/17/19   Pahwani, Michell Heinrich, MD  cetirizine (ZYRTEC) 5 MG tablet Take 1 tablet (5 mg total) by mouth daily. 11/17/19 12/17/19  Pahwani, Michell Heinrich, MD  cyclobenzaprine (FLEXERIL) 5 MG tablet Take 5 mg by mouth 2 (two) times daily as needed for muscle spasms.     [provider]  docusate sodium (COLACE) 100 MG capsule Take 1 capsule (100 mg total) by mouth 2 (two) times daily. 11/17/19   Pahwani, Michell Heinrich, MD  famotidine (PEPCID) 20 MG tablet Take 1 tablet (20 mg total) by mouth 2 (two) times daily. 11/17/19   Pahwani, Michell Heinrich, MD  HYDROcodone-acetaminophen (NORCO/VICODIN) 5-325 MG tablet One tablet every 6 hours as needed for severe pain. 11/17/19   Pahwani, Michell Heinrich, MD  insulin aspart (NOVOLOG) 100 UNIT/ML FlexPen Inject 2 Units into the skin 3 (three) times daily with meals. 11/17/19   Pahwani, Michell Heinrich, MD  insulin glargine (LANTUS) 100 UNIT/ML injection Inject 0.15 mLs (15  Units total) into the skin at bedtime. 05/24/20   Wyvonnia Dusky, MD  Insulin Pen Needle 29G X 12MM MISC 1 Dose by Does not apply route 3 (three) times daily before meals. 06/18/19   Loletha Grayer, MD  lipase/protease/amylase (CREON) 36000 UNITS CPEP capsule Take 2 capsules (72,000 Units total) by mouth 3 (three) times daily with meals. 11/17/19   Pahwani, Michell Heinrich, MD  omeprazole (PRILOSEC) 40 MG capsule Take 1 capsule (40 mg total) by mouth in the morning and at bedtime. 11/17/19   Pahwani, Michell Heinrich, MD  ondansetron (ZOFRAN ODT) 4 MG disintegrating tablet Take 1 tablet (4 mg total) by mouth every 8 (eight) hours as needed for nausea or vomiting. 04/23/20   Blake Divine, MD  ondansetron (ZOFRAN ODT) 4 MG disintegrating tablet Take 1 tablet (4 mg total) by mouth every 8 (eight) hours as needed for nausea or vomiting. 05/15/20   Vladimir Crofts, MD  Polyethylene Glycol 3350 POWD Take 17 g by mouth at bedtime. Mix 17 grams in 4-8 oz of liquid and drink     [provider]  pregabalin (LYRICA) 75 MG capsule Take  1 capsule (75 mg total) by mouth 2 (two) times daily. 11/17/19   Pahwani, Michell Heinrich, MD  rosuvastatin (CRESTOR) 20 MG tablet Take 1 tablet (20 mg total) by mouth daily. 11/17/19   Pahwani, Michell Heinrich, MD  simethicone (MYLICON) 80 MG chewable tablet Chew 1 tablet (80 mg total) by mouth 4 (four) times daily. Patient taking differently: Chew 80 mg by mouth as needed.  06/18/19   Loletha Grayer, MD  triamcinolone cream (KENALOG) 0.5 % Apply twice a day to back of thighs 06/18/19   Loletha Grayer, MD  VASCEPA 0.5 g CAPS Take 4 capsules by mouth in the morning and at bedtime. 11/17/19   Pahwani, Michell Heinrich, MD  zolpidem (AMBIEN) 10 MG tablet Take 1 tablet by mouth at bedtime. 05/10/19   [provider]  GLUCERNA (GLUCERNA) LIQD Take 237 mLs by mouth 3 (three) times daily between meals. 02/19/15 02/27/15  Bettey Costa, MD    Allergies Penicillins, Reglan [metoclopramide], Fentanyl, and  Percocet [oxycodone-acetaminophen]  Family History  Problem Relation Age of Onset  . Diabetes Mother     Social History Social History   Tobacco Use  . Smoking status: Former Research scientist (life sciences)  . Smokeless tobacco: Never Used  . Tobacco comment: quit 7 years ago   Vaping Use  . Vaping Use: Never used  Substance Use Topics  . Alcohol use: Not Currently    Comment: quit drinking 2 years ago   . Drug use: No    Review of Systems  Review of Systems  Constitutional: Negative for chills and fever.  HENT: Negative for sore throat.   Eyes: Negative for pain.  Respiratory: Positive for shortness of breath ( when vomiting). Negative for cough and stridor.   Cardiovascular: Negative for chest pain.  Gastrointestinal: Positive for abdominal pain, diarrhea, nausea and vomiting.  Genitourinary: Negative for dysuria.  Musculoskeletal: Negative for myalgias.  Skin: Negative for rash.  Neurological: Positive for headaches. Negative for seizures and loss of consciousness.  Psychiatric/Behavioral: Negative for suicidal ideas.  All other systems reviewed and are negative.     ____________________________________________   PHYSICAL EXAM:  VITAL SIGNS: ED Triage Vitals  Enc Vitals Group     BP 06/10/20 0812 (!) 179/83     Pulse Rate 06/10/20 0812 85     Resp 06/10/20 0812 (!) 22     Temp 06/10/20 0812 98.2 F (36.8 C)     Temp Source 06/10/20 0812 Oral     SpO2 06/10/20 0812 100 %     Weight 06/10/20 0812 126 lb (57.2 kg)     Height 06/10/20 0812 5' 1"  (1.549 m)     Head Circumference --      Peak Flow --      Pain Score 06/10/20 0812 8     Pain Loc --      Pain Edu? --      Excl. in [place]? --    Vitals:   06/10/20 0915 06/10/20 1000  BP: (!) 168/81 (!) 171/77  Pulse: 84 81  Resp: 18 18  Temp:    SpO2: 99% 98%   Physical Exam Vitals and nursing note reviewed.  Constitutional:      General: She is not in acute distress.    Appearance: She is well-developed.  HENT:     Head:  Normocephalic and atraumatic.     Right Ear: External ear normal.     Left Ear: External ear normal.     Nose: Nose normal.  Mouth/Throat:     Mouth: Mucous membranes are dry.  Eyes:     Conjunctiva/sclera: Conjunctivae normal.  Cardiovascular:     Rate and Rhythm: Normal rate and regular rhythm.     Heart sounds: No murmur heard.   Pulmonary:     Effort: Pulmonary effort is normal. No respiratory distress.     Breath sounds: Normal breath sounds.  Abdominal:     Palpations: Abdomen is soft.     Tenderness: There is abdominal tenderness. There is no right CVA tenderness or left CVA tenderness.  Musculoskeletal:     Cervical back: Neck supple.  Skin:    General: Skin is warm and dry.     Capillary Refill: Capillary refill takes 2 to 3 seconds.  Neurological:     Mental Status: She is alert and oriented to person, place, and time.  Psychiatric:        Mood and Affect: Mood normal.      ____________________________________________   LABS (all labs ordered are listed, but only abnormal results are displayed)  Labs Reviewed  CBC WITH DIFFERENTIAL/PLATELET - Abnormal; Notable for the following components:      Result Value   Hemoglobin 11.9 (*)    HCT 33.3 (*)    All other components within normal limits  COMPREHENSIVE METABOLIC PANEL - Abnormal; Notable for the following components:   Glucose, Bld 247 (*)    Creatinine, Ser 1.24 (*)    Alkaline Phosphatase 175 (*)    GFR, Estimated 49 (*)    All other components within normal limits  LIPASE, BLOOD - Abnormal; Notable for the following components:   Lipase 59 (*)    All other components within normal limits  URINALYSIS, COMPLETE (UACMP) WITH MICROSCOPIC - Abnormal; Notable for the following components:   Color, Urine STRAW (*)    APPearance CLEAR (*)    Specific Gravity, Urine 1.001 (*)    Glucose, UA >=500 (*)    Protein, ur 100 (*)    Leukocytes,Ua SMALL (*)    Bacteria, UA RARE (*)    All other components  within normal limits  URINE CULTURE  TYPE AND SCREEN  TROPONIN I (HIGH SENSITIVITY)  TROPONIN I (HIGH SENSITIVITY)   ____________________________________________  EKG  Sinus rhythm with a ventricular rate of 84, normal axis, unremarkable intervals without evidence of acute ischemia or significant underlying arrhythmia. ____________________________________________  RADIOLOGY  ED MD interpretation: Chest x-ray has no focal consolidation, large effusion, significant hemopneumothorax any other clear acute thoracic process.  CT abdomen pelvis remarkable for some bladder wall thickening and bilateral stranding with diverticulosis and no evidence of diverticulitis.  No evidence of pancreatitis, cholecystitis, myositis or diverticulitis.  No other clear acute abdominal pelvic pathology.  Official radiology report(s): CT ABDOMEN PELVIS WO CONTRAST  Result Date: 06/10/2020 CLINICAL DATA:  65 year old female with abdominal pain, black diarrhea and weakness. EXAM: CT ABDOMEN AND PELVIS WITHOUT CONTRAST TECHNIQUE: Multidetector CT imaging of the abdomen and pelvis was performed following the standard protocol without IV contrast. COMPARISON:  CT Abdomen and Pelvis 05/15/2020 and earlier. FINDINGS: Lower chest: Stable mildly increased lung base interstitial markings. No pericardial or pleural effusion. Hepatobiliary: Negative noncontrast liver and gallbladder. Pancreas: Negative. Spleen: Negative. Adrenals/Urinary Tract: Normal adrenal glands. Punctate right nephrolithiasis. No hydronephrosis. Persistent but stable nonspecific moderate bilateral perinephric stranding. Ureters are decompressed and within normal limits the bladder. Moderate generalized bladder wall thickening is redemonstrated (sagittal image 59). No perivesical stranding. Incidental pelvic phleboliths. Stomach/Bowel: Redundant large bowel with intermittent  retained stool. Diverticulosis at the hepatic flexure and in the ascending colon, but no  active inflammation identified. Sequelae of appendectomy. Decompressed and negative terminal ileum. No dilated small bowel. Decompressed stomach and duodenum. No free air or free fluid. No mesenteric stranding. Vascular/Lymphatic: Aortoiliac calcified atherosclerosis. Vascular patency is not evaluated in the absence of IV contrast. No lymphadenopathy. Reproductive: Absent uterus. Ovaries are within normal limits on series 2, image 66. Other: No pelvic free fluid. Musculoskeletal: No acute osseous abnormality identified. IMPRESSION: 1. No acute or inflammatory process identified in the non-contrast abdomen or pelvis. 2. Persistent bladder wall thickening raising the possibility of Chronic Cystitis. Ongoing nonspecific bilateral perinephric stranding, query renal insufficiency. Punctate right nephrolithiasis without obstructive uropathy. Diverticulosis of the large bowel without active inflammation. 3. Aortic Atherosclerosis (ICD10-I70.0). Electronically Signed   By: Genevie Ann M.D.   On: 06/10/2020 09:48   DG Chest 2 View  Result Date: 06/10/2020 CLINICAL DATA:  65 year old female with shortness of breath. EXAM: CHEST - 2 VIEW COMPARISON:  06/09/2020 FINDINGS: The mediastinal contours are within normal limits. No cardiomegaly. Surgical clips in the base of the right neck are again visualized. The lungs are clear bilaterally without evidence of focal consolidation, pleural effusion, or pneumothorax. No acute osseous abnormality. IMPRESSION: No acute cardiopulmonary process. Electronically Signed   By: Ruthann Cancer MD   On: 06/10/2020 08:43    ____________________________________________   PROCEDURES  Procedure(s) performed (including Critical Care):  .1-3 Lead EKG Interpretation Performed by: Lucrezia Starch, MD Authorized by: Lucrezia Starch, MD     Interpretation: normal     ECG rate assessment: normal     Rhythm: sinus rhythm     Ectopy: none     Conduction: normal        ____________________________________________   INITIAL IMPRESSION / ASSESSMENT AND PLAN / ED COURSE      Patient presents with above-stated history and exam for assessment of some persistent nausea, vomiting, abdominal pain and diarrhea associate with some shortness of breath when she is vomiting.  On arrival she is hypertensive with a BP of 179/83 with otherwise stable vital signs on room air.  Some mild abdominal tenderness on exam her abdomen but she is not peritoneal neck and has no guarding or CVA tenderness.  In addition her lungs are clear bilaterally.  Suspect shortness of breath may be related to her persistent vomiting.  Low suspicion for PE given she denies any chest pain and has not tachycardic hypoxic.  While she was initially started having respiratory of 22 on my assessment she is not tachypneic and is denying any shortness of breath at rest.  Chest x-ray is unremarkable for evidence of pneumonia pneumothorax effusion edema or other clear acute intrathoracic process.  In addition EKG and troponin are not suggestive of significant arrhythmia or ACS.  With [place] vomiting diarrhea abdominal pain differential includes possible acute infectious gastroenteritis, diverticulitis, cystitis, kidney stone, pancreatitis, cholecystitis and appendicitis.  CBC shows no leukocytosis and hemoglobin unchanged from yesterday.  In addition she is Hemoccult negative and Evalose patient for any GI bleed at this time.  CMP shows glucose of 247 with a kidney function of 1.24 compared to 1.44 yesterday without any other significant metabolic derangements.  There is no transaminitis and alk phos of 175 appears baseline.  No other evidence of cholestasis i.e. bilirubin 0.6.  Troponin is downtrending from 1 was last checked yesterday at 34 compared to 69.  Low suspicion for acute pancreatitis.  CT obtained  shows no clear acute abdominopelvic process but there is some gallbladder thickening consistent  with chronic cystitis.  There is also possibly some nonspecific perinephric stranding although she has no CVA tenderness fever or leukocytosis to suggest pyelonephritis at this time.  Punctate right kidney stone without obstructive uropathy.  There is also diverticulosis without evidence of diverticulitis.  UA is consistent with possible cystitis.  Unclear if patient has nausea vomiting diarrhea related to her UTI or concha mitten gastroenteritis.  However do not believe she is septic she has no CVA tenderness fever or leukocytosis to suggest pyelonephritis.  She is tolerating p.o. and states she feels much better on my reassessment.  She was given a dose of IV antibiotics in the emergency room and urine culture was obtained.  However given reassessment she is now tolerating p.o. I think she is stable for discharge with close outpatient follow-up.  Discharged stable condition.  Strict return precautions advised and discussed.  She states already has Zofran.  Rx written for Bactrim.        ____________________________________________   FINAL CLINICAL IMPRESSION(S) / ED DIAGNOSES  Final diagnoses:  Acute cystitis without hematuria  Diarrhea, unspecified type  Hypertension, unspecified type    Medications  insulin aspart (novoLOG) injection 0-15 Units (0 Units Subcutaneous Not Given 06/10/20 1003)  ciprofloxacin (CIPRO) IVPB 400 mg (has no administration in time range)  HYDROmorphone (DILAUDID) injection 0.5 mg (0.5 mg Intravenous Given 06/10/20 1001)  lactated ringers bolus 1,000 mL (1,000 mLs Intravenous New Bag/Given 06/10/20 0959)  dicyclomine (BENTYL) capsule 10 mg (10 mg Oral Given 06/10/20 0932)  pantoprazole (PROTONIX) injection 40 mg (40 mg Intravenous Given 06/10/20 1003)  ondansetron (ZOFRAN) injection 4 mg (4 mg Intravenous Given 06/10/20 1001)     ED Discharge Orders         Ordered    sulfamethoxazole-trimethoprim (BACTRIM DS) 800-160 MG tablet  2 times daily        06/10/20 1048            Note:  This document was prepared using Dragon voice recognition software and may include unintentional dictation errors.   Lucrezia Starch, MD 06/10/20 1051

## 2020-06-10 NOTE — ED Triage Notes (Signed)
Pt presents to ED via POV, states black diarrhea, weakness, SOB, dizziness, pt states seen yesterday for same and symptoms not improving.

## 2020-06-10 NOTE — ED Triage Notes (Signed)
First Nurse Note:  C/O black diarrhea and weakness.  States was seen yesterday for same, not improving.

## 2020-06-11 LAB — URINE CULTURE: Culture: <10000 — AB

## 2020-08-26 ENCOUNTER — Emergency Department: Payer: Medicaid Other

## 2020-08-26 ENCOUNTER — Other Ambulatory Visit: Payer: Self-pay

## 2020-08-26 ENCOUNTER — Emergency Department
Admission: EM | Admit: 2020-08-26 | Discharge: 2020-08-26 | Disposition: A | Discharge status: Home / Self Care | Payer: Medicaid Other | Attending: Emergency Medicine | Admitting: Emergency Medicine

## 2020-08-26 ENCOUNTER — Encounter: Payer: Self-pay | Admitting: Emergency Medicine

## 2020-08-26 DIAGNOSIS — N1831 Chronic kidney disease, stage 3a: Secondary | ICD-10-CM | POA: Insufficient documentation

## 2020-08-26 DIAGNOSIS — J45909 Unspecified asthma, uncomplicated: Secondary | ICD-10-CM | POA: Diagnosis not present

## 2020-08-26 DIAGNOSIS — R1013 Epigastric pain: Secondary | ICD-10-CM | POA: Diagnosis present

## 2020-08-26 DIAGNOSIS — E1142 Type 2 diabetes mellitus with diabetic polyneuropathy: Secondary | ICD-10-CM | POA: Diagnosis not present

## 2020-08-26 DIAGNOSIS — I129 Hypertensive chronic kidney disease with stage 1 through stage 4 chronic kidney disease, or unspecified chronic kidney disease: Secondary | ICD-10-CM | POA: Insufficient documentation

## 2020-08-26 DIAGNOSIS — Z79899 Other long term (current) drug therapy: Secondary | ICD-10-CM | POA: Insufficient documentation

## 2020-08-26 DIAGNOSIS — Z794 Long term (current) use of insulin: Secondary | ICD-10-CM | POA: Insufficient documentation

## 2020-08-26 DIAGNOSIS — Z87891 Personal history of nicotine dependence: Secondary | ICD-10-CM | POA: Diagnosis not present

## 2020-08-26 DIAGNOSIS — R110 Nausea: Secondary | ICD-10-CM | POA: Diagnosis not present

## 2020-08-26 DIAGNOSIS — R1010 Upper abdominal pain, unspecified: Secondary | ICD-10-CM | POA: Diagnosis not present

## 2020-08-26 DIAGNOSIS — E1122 Type 2 diabetes mellitus with diabetic chronic kidney disease: Secondary | ICD-10-CM | POA: Diagnosis not present

## 2020-08-26 DIAGNOSIS — R140 Abdominal distension (gaseous): Secondary | ICD-10-CM | POA: Diagnosis not present

## 2020-08-26 LAB — URINALYSIS, COMPLETE (UACMP) WITH MICROSCOPIC
Bacteria, UA: NONE SEEN
Bilirubin Urine: NEGATIVE
Glucose, UA: 250 mg/dL — AB
Ketones, ur: NEGATIVE mg/dL
Nitrite: NEGATIVE
Protein, ur: 100 mg/dL — AB
Specific Gravity, Urine: 1.015 (ref 1.005–1.030)
pH: 7 (ref 5.0–8.0)

## 2020-08-26 LAB — COMPREHENSIVE METABOLIC PANEL
ALT: 49 U/L — ABNORMAL HIGH (ref 0–44)
AST: 35 U/L (ref 15–41)
Albumin: 4.1 g/dL (ref 3.5–5.0)
Alkaline Phosphatase: 281 U/L — ABNORMAL HIGH (ref 38–126)
Anion gap: 11 (ref 5–15)
BUN: 33 mg/dL — ABNORMAL HIGH (ref 8–23)
CO2: 26 mmol/L (ref 22–32)
Calcium: 9.4 mg/dL (ref 8.9–10.3)
Chloride: 101 mmol/L (ref 98–111)
Creatinine, Ser: 1.49 mg/dL — ABNORMAL HIGH (ref 0.44–1.00)
GFR, Estimated: 39 mL/min — ABNORMAL LOW (ref >60)
Glucose, Bld: 281 mg/dL — ABNORMAL HIGH (ref 70–99)
Potassium: 4.3 mmol/L (ref 3.5–5.1)
Sodium: 138 mmol/L (ref 135–145)
Total Bilirubin: 0.6 mg/dL (ref 0.3–1.2)
Total Protein: 8 g/dL (ref 6.5–8.1)

## 2020-08-26 LAB — CBC
HCT: 37 % (ref 36.0–46.0)
Hemoglobin: 12.3 g/dL (ref 12.0–15.0)
MCH: 28.7 pg (ref 26.0–34.0)
MCHC: 33.2 g/dL (ref 30.0–36.0)
MCV: 86.4 fL (ref 80.0–100.0)
Platelets: 275 10*3/uL (ref 150–400)
RBC: 4.28 MIL/uL (ref 3.87–5.11)
RDW: 12.1 % (ref 11.5–15.5)
WBC: 8.2 10*3/uL (ref 4.0–10.5)
nRBC: 0 % (ref 0.0–0.2)

## 2020-08-26 LAB — LIPASE, BLOOD: Lipase: 84 U/L — ABNORMAL HIGH (ref 11–51)

## 2020-08-26 MED ORDER — MORPHINE SULFATE (PF) 4 MG/ML IV SOLN
4.0000 mg | Freq: Once | INTRAVENOUS | Status: AC
  Administered 2020-08-26: 4 mg via INTRAVENOUS
  Filled 2020-08-26: qty 1

## 2020-08-26 MED ORDER — ONDANSETRON HCL 4 MG/2ML IJ SOLN
4.0000 mg | Freq: Once | INTRAMUSCULAR | Status: AC
  Administered 2020-08-26: 4 mg via INTRAVENOUS
  Filled 2020-08-26: qty 2

## 2020-08-26 MED ORDER — SODIUM CHLORIDE 0.9 % IV BOLUS
1000.0000 mL | Freq: Once | INTRAVENOUS | Status: AC
  Administered 2020-08-26: 1000 mL via INTRAVENOUS

## 2020-08-26 MED ORDER — FOSFOMYCIN TROMETHAMINE 3 G PO PACK
3.0000 g | PACK | Freq: Once | ORAL | Status: AC
  Administered 2020-08-26: 3 g via ORAL
  Filled 2020-08-26: qty 3

## 2020-08-26 NOTE — ED Provider Notes (Signed)
[facility] Emergency Department Provider Note   ____________________________________________   Event Date/Time   First MD Initiated Contact with Patient 08/26/20 [phone]     (approximate)  I have reviewed the triage vital signs and the nursing notes.   HISTORY  Chief Complaint Abdominal Pain   HPI Karen [name] is a 65 y.o. female with a history of chronic pancreatitis resulting from alcoholic pancreatitis.  Patient has not been drinking.  Patient did run out of her hydrocodone.  Her pain is flared up and she has not been regularly refilled over the weekend.  She comes in complaining of epigastric pain and bloating.  Pain is moderate and chronic deep and achy.  She is nauseated but has no fever.  No other symptoms except for some lightheadedness..       Past Medical History:  Diagnosis Date   Alcoholic pancreatitis    Asthma    Depression    Diabetes mellitus without complication ([place])    DKA (diabetic ketoacidoses) 02/17/2015   Emphysematous cystitis    Hypercholesteremia    Hypertension    Hypokalemia     Patient Active Problem List   Diagnosis Date Noted   Epigastric pain 05/18/2020   Acute cystitis 05/18/2020   Intractable nausea and vomiting 05/17/2020   Stroke-like symptom 11/15/2019   Itching    Diabetic polyneuropathy associated with type 2 diabetes mellitus ([place])    Leukocytosis 06/13/2019   Chronic kidney disease (CKD), stage II (mild)    CKD (chronic kidney disease), stage IIIa 03/15/2019   HTN (hypertension) 03/15/2019   Type II diabetes mellitus with renal manifestations ([place]) 03/15/2019   Headache 02/26/2019   Dizziness 02/26/2019   Hyperglycemia due to type 2 diabetes mellitus ([place]) 01/30/2019   Acute pancreatitis 01/30/2019   Constipation    Acute on chronic pancreatitis ([place]) 01/25/2019   Acute pain of left shoulder    Adhesive capsulitis of left shoulder    Abnormal LFTs 01/24/2019   Cough 01/24/2019   Hypertension     HLD (hyperlipidemia)    Type 2 diabetes mellitus with hyperlipidemia (HCC)    GERD (gastroesophageal reflux disease)    Acute kidney injury superimposed on CKD ([place])    Pancreatitis, recurrent    Chronic pancreatitis (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   Coffee ground emesis    Upper GI bleed 11/11/2017   Pancreatitis, acute 01/03/2017   ETOH abuse 01/03/2017   UTI (urinary tract infection) 01/03/2017   Nausea and vomiting    Esophageal candidiasis (HCC)    Intractable vomiting with nausea    Hypertensive urgency 04/21/2016   Abdominal pain    Nausea vomiting and diarrhea    Smoker    Poorly controlled type 2 diabetes mellitus ([place])    Acute renal insufficiency 10/29/2015   Elevated troponin 10/29/2015   Urinary tract infection without hematuria 10/29/2015   Left flank pain 10/29/2015   Malignant essential hypertension 10/29/2015   DKA (diabetic ketoacidoses) 02/17/2015   Emphysematous cystitis     Past Surgical History:  Procedure Laterality Date   [place]   ESOPHAGOGASTRODUODENOSCOPY N/A 11/12/2017   Procedure: ESOPHAGOGASTRODUODENOSCOPY (EGD);  Surgeon: Lin Landsman, MD;  Location: [facility] ENDOSCOPY;  Service: Gastroenterology;  Laterality: N/A;   ESOPHAGOGASTRODUODENOSCOPY N/A 09/29/2018   Procedure: ESOPHAGOGASTRODUODENOSCOPY (EGD);  Surgeon: Toledo, Benay Pike, MD;  Location: [facility] ENDOSCOPY;  Service: Gastroenterology;  Laterality: N/A;   ESOPHAGOGASTRODUODENOSCOPY (EGD) WITH PROPOFOL N/A 04/24/2016  Procedure: ESOPHAGOGASTRODUODENOSCOPY (EGD) WITH PROPOFOL;  Surgeon: [name], MD;  Location: [facility] ENDOSCOPY;  Service: Endoscopy;  Laterality: N/A;   ESOPHAGOGASTRODUODENOSCOPY (EGD) WITH PROPOFOL N/A 01/28/2019   Procedure: ESOPHAGOGASTRODUODENOSCOPY (EGD) WITH PROPOFOL;  Surgeon: Toledo, Benay Pike, MD;  Location: [facility] ENDOSCOPY;  Service: Gastroenterology;  Laterality: N/A;   EYE SURGERY     HAND [place]   THYROID SURGERY  2013    Prior to Admission medications   Medication Sig Start Date End Date Taking? Authorizing Provider  amLODipine (NORVASC) 10 MG tablet Take 1 tablet (10 mg total) by mouth daily. 11/17/19   Pahwani, Michell Heinrich, MD  ARIPiprazole (ABILIFY) 5 MG tablet Take 1 tablet (5 mg total) by mouth daily. 11/17/19   Pahwani, Michell Heinrich, MD  cetirizine (ZYRTEC) 5 MG tablet Take 1 tablet (5 mg total) by mouth daily. 11/17/19 12/17/19  Pahwani, Michell Heinrich, MD  cyclobenzaprine (FLEXERIL) 5 MG tablet Take 5 mg by mouth 2 (two) times daily as needed for muscle spasms.     [provider]  docusate sodium (COLACE) 100 MG capsule Take 1 capsule (100 mg total) by mouth 2 (two) times daily. 11/17/19   Pahwani, Michell Heinrich, MD  famotidine (PEPCID) 20 MG tablet Take 1 tablet (20 mg total) by mouth 2 (two) times daily. 11/17/19   Pahwani, Michell Heinrich, MD  HYDROcodone-acetaminophen (NORCO/VICODIN) 5-325 MG tablet One tablet every 6 hours as needed for severe pain. 11/17/19   Pahwani, Michell Heinrich, MD  insulin aspart (NOVOLOG) 100 UNIT/ML FlexPen Inject 2 Units into the skin 3 (three) times daily with meals. 11/17/19   Pahwani, Michell Heinrich, MD  insulin glargine (LANTUS) 100 UNIT/ML injection Inject 0.15 mLs (15 Units total) into the skin at bedtime. 05/24/20   Wyvonnia Dusky, MD  Insulin Pen Needle 29G X 12MM MISC 1 Dose by Does not apply route 3 (three) times daily before meals. 06/18/19   Loletha Grayer, MD  lipase/protease/amylase (CREON) 36000 UNITS CPEP capsule Take 2 capsules (72,000 Units total) by mouth 3 (three) times daily with meals. 11/17/19   Pahwani, Michell Heinrich, MD  omeprazole (PRILOSEC) 40 MG capsule Take 1 capsule (40 mg total) by mouth in the morning and at bedtime. 11/17/19   Pahwani, Michell Heinrich, MD  ondansetron (ZOFRAN ODT) 4 MG disintegrating tablet Take 1 tablet (4 mg total) by mouth every 8 (eight) hours as needed for nausea or vomiting. 04/23/20   Blake Divine, MD  ondansetron (ZOFRAN ODT)  4 MG disintegrating tablet Take 1 tablet (4 mg total) by mouth every 8 (eight) hours as needed for nausea or vomiting. 05/15/20   Vladimir Crofts, MD  Polyethylene Glycol 3350 POWD Take 17 g by mouth at bedtime. Mix 17 grams in 4-8 oz of liquid and drink     [provider]  pregabalin (LYRICA) 75 MG capsule Take 1 capsule (75 mg total) by mouth 2 (two) times daily. 11/17/19   Pahwani, Michell Heinrich, MD  rosuvastatin (CRESTOR) 20 MG tablet Take 1 tablet (20 mg total) by mouth daily. 11/17/19   Pahwani, Michell Heinrich, MD  simethicone (MYLICON) 80 MG chewable tablet Chew 1 tablet (80 mg total) by mouth 4 (four) times daily. Patient taking differently: Chew 80 mg by mouth as needed.  06/18/19   Loletha Grayer, MD  triamcinolone cream (KENALOG) 0.5 % Apply twice a day to back of thighs 06/18/19   Loletha Grayer, MD  VASCEPA 0.5 g CAPS Take 4 capsules by mouth in the morning and at  bedtime. 11/17/19   Pahwani, Michell Heinrich, MD  zolpidem (AMBIEN) 10 MG tablet Take 1 tablet by mouth at bedtime. 05/10/19   [provider]  GLUCERNA (GLUCERNA) LIQD Take 237 mLs by mouth 3 (three) times daily between meals. 02/19/15 02/27/15  Bettey Costa, MD    Allergies Penicillins, Reglan [metoclopramide], Fentanyl, and Percocet [oxycodone-acetaminophen]  Family History  Problem Relation Age of Onset   Diabetes Mother     Social History Social History   Tobacco Use   Smoking status: Former   Smokeless tobacco: Never   Tobacco comments:    quit 7 years ago   Vaping Use   Vaping Use: Never used  Substance Use Topics   Alcohol use: Not Currently    Comment: quit drinking 2 years ago    Drug use: No    Review of Systems  Constitutional: No fever/chills Eyes: No visual changes. ENT: No sore throat. Cardiovascular: Denies chest pain. Respiratory: Denies shortness of breath. Gastrointestinal: abdominal pain. nausea, no vomiting.  No diarrhea.  No constipation. Genitourinary: Occasional mild  dysuria. Musculoskeletal: Negative for back pain. Skin: Negative for rash. Neurological: Negative for headaches, focal weakness   ____________________________________________   PHYSICAL EXAM:  VITAL SIGNS: ED Triage Vitals  Enc Vitals Group     BP 08/26/20 0627 (!) 179/79     Pulse Rate 08/26/20 0627 90     Resp 08/26/20 0627 18     Temp 08/26/20 0627 98.7 F (37.1 C)     Temp Source 08/26/20 0627 Oral     SpO2 08/26/20 0627 100 %     Weight 08/26/20 0631 126 lb 1.7 oz (57.2 kg)     Height --      Head Circumference --      Peak Flow --      Pain Score 08/26/20 0655 8     Pain Loc --      Pain Edu? --      Excl. in [place]? --     Constitutional: Alert and oriented. Well appearing and in no acute distress. Eyes: Conjunctivae are normal. PER. EOMI. Head: Atraumatic. Nose: No congestion/rhinnorhea. Mouth/Throat: Mucous membranes are moist.  Oropharynx non-erythematous. Neck: No stridor.  Cardiovascular: Normal rate, regular rhythm. Grossly normal heart sounds.  Good peripheral circulation. Respiratory: Normal respiratory effort.  No retractions. Lungs CTAB. Gastrointestinal: Soft mildly tender in the epigastric area slight distention. No abdominal bruits.  Musculoskeletal: No lower extremity tenderness nor edema.   Neurologic:  Normal speech and language. No gross focal neurologic deficits are appreciated.  Skin:  Skin is warm, dry and intact. No rash noted.   ____________________________________________   LABS (all labs ordered are listed, but only abnormal results are displayed)  Labs Reviewed  LIPASE, BLOOD - Abnormal; Notable for the following components:      Result Value   Lipase 84 (*)    All other components within normal limits  COMPREHENSIVE METABOLIC PANEL - Abnormal; Notable for the following components:   Glucose, Bld 281 (*)    BUN 33 (*)    Creatinine, Ser 1.49 (*)    ALT 49 (*)    Alkaline Phosphatase 281 (*)    GFR, Estimated 39 (*)    All other  components within normal limits  URINALYSIS, COMPLETE (UACMP) WITH MICROSCOPIC - Abnormal; Notable for the following components:   Color, Urine YELLOW (*)    APPearance CLEAR (*)    Glucose, UA 250 (*)    Hgb urine dipstick TRACE (*)  Protein, ur 100 (*)    Leukocytes,Ua TRACE (*)    All other components within normal limits  URINE CULTURE  CBC   ____________________________________________  EKG   ____________________________________________  RADIOLOGY Gertha Calkin, personally viewed and evaluated these images (plain radiographs) as part of my medical decision making, as well as reviewing the written report by the radiologist.  ED MD interpretation:    Official radiology report(s): US Abdomen Limited  Result Date: 08/26/2020 CLINICAL DATA:  History of pancreatitis with similar pain, evaluate pancreas. EXAM: ULTRASOUND ABDOMEN LIMITED COMPARISON:  Abdominal CT 06/10/2020 FINDINGS: Typical coverage of the pancreas shows no visible edema or mass. Readily visualized main duct but not dilated at 2 mm. IMPRESSION: Negative ultrasound of the pancreas. Electronically Signed   By: Monte Fantasia M.D.   On: 08/26/2020 08:48    ____________________________________________   PROCEDURES  Procedure(s) performed (including Critical Care):  Procedures   ____________________________________________   INITIAL IMPRESSION / ASSESSMENT AND PLAN / ED COURSE  Patient has had well over 20 CT scans of various types of we will try to get an ultrasound today to check on her pancreas.  Her lipase is mildly elevated.    ----------------------------------------- 9:17 AM on 08/26/2020 ----------------------------------------- Patient has had some fluid and some pain medicine and nausea medicine and is now feeling well and wants to go home.  I will give her a small amount of Vicodin to last for few days and patient also says she is having difficulty falling asleep so I will give her a few  pills of Ambien.  She has been told not to take them together.  She also has some dysuria intermittently.  She has a lot of white cells in her urine.  I will give her some fosfomycin here.         ____________________________________________   FINAL CLINICAL IMPRESSION(S) / ED DIAGNOSES  Final diagnoses:  Pain of upper abdomen     ED Discharge Orders     None        Note:  This document was prepared using Dragon voice recognition software and may include unintentional dictation errors.    Nena Polio, MD 08/26/20 [phone]

## 2020-08-26 NOTE — ED Triage Notes (Signed)
Pt in with upper central abdomen pain, ongoing with hx of pancreatitis. States she ran out of her Hydrocodone 2 days ago, denies any cp or sob. Does have hx of DM also. Abdomen distended, states more than normal. Recently trx for UTI. Says she had emesis and chills last night

## 2020-08-26 NOTE — ED Notes (Signed)
Ultrasound at bedside now.

## 2020-08-26 NOTE — ED Notes (Addendum)
Pt presents to the ED for generalized abd pain and NV. Pt states that it started this AM around 0300.

## 2020-08-26 NOTE — Discharge Instructions (Addendum)
Please follow-up with your regular doctor.  You can use the Ambien 10 mg 1 pill at bedtime to help you sleep.  You can use the Vicodin 1 pill up to 4 times a day as needed for pain.  Do not take it with the Ambien or within 4 hours of the Ambien.  It might make you too sleepy.  I have given you some fosfomycin here for the mild UTI.  Please return for increasing pain fever vomiting or feeling sicker.

## 2020-08-27 LAB — URINE CULTURE: Culture: <10000 — AB

## 2020-09-17 ENCOUNTER — Emergency Department
Admission: EM | Admit: 2020-09-17 | Discharge: 2020-09-17 | Disposition: A | Discharge status: Home / Self Care | Payer: Medicaid Other | Attending: Emergency Medicine | Admitting: Emergency Medicine

## 2020-09-17 ENCOUNTER — Other Ambulatory Visit: Payer: Self-pay

## 2020-09-17 ENCOUNTER — Encounter: Payer: Self-pay | Admitting: Emergency Medicine

## 2020-09-17 ENCOUNTER — Emergency Department: Payer: Medicaid Other

## 2020-09-17 DIAGNOSIS — Z87891 Personal history of nicotine dependence: Secondary | ICD-10-CM | POA: Insufficient documentation

## 2020-09-17 DIAGNOSIS — J45909 Unspecified asthma, uncomplicated: Secondary | ICD-10-CM | POA: Diagnosis not present

## 2020-09-17 DIAGNOSIS — E1110 Type 2 diabetes mellitus with ketoacidosis without coma: Secondary | ICD-10-CM | POA: Insufficient documentation

## 2020-09-17 DIAGNOSIS — Z7984 Long term (current) use of oral hypoglycemic drugs: Secondary | ICD-10-CM | POA: Insufficient documentation

## 2020-09-17 DIAGNOSIS — E1122 Type 2 diabetes mellitus with diabetic chronic kidney disease: Secondary | ICD-10-CM | POA: Insufficient documentation

## 2020-09-17 DIAGNOSIS — M542 Cervicalgia: Secondary | ICD-10-CM | POA: Diagnosis present

## 2020-09-17 DIAGNOSIS — E041 Nontoxic single thyroid nodule: Secondary | ICD-10-CM | POA: Insufficient documentation

## 2020-09-17 DIAGNOSIS — E1142 Type 2 diabetes mellitus with diabetic polyneuropathy: Secondary | ICD-10-CM | POA: Diagnosis not present

## 2020-09-17 DIAGNOSIS — N1831 Chronic kidney disease, stage 3a: Secondary | ICD-10-CM | POA: Insufficient documentation

## 2020-09-17 DIAGNOSIS — Z794 Long term (current) use of insulin: Secondary | ICD-10-CM | POA: Diagnosis not present

## 2020-09-17 DIAGNOSIS — Z79899 Other long term (current) drug therapy: Secondary | ICD-10-CM | POA: Diagnosis not present

## 2020-09-17 DIAGNOSIS — I129 Hypertensive chronic kidney disease with stage 1 through stage 4 chronic kidney disease, or unspecified chronic kidney disease: Secondary | ICD-10-CM | POA: Diagnosis not present

## 2020-09-17 LAB — CBC WITH DIFFERENTIAL/PLATELET
Abs Immature Granulocytes: 0.06 10*3/uL (ref 0.00–0.07)
Basophils Absolute: 0.1 10*3/uL (ref 0.0–0.1)
Basophils Relative: 1 %
Eosinophils Absolute: 0.2 10*3/uL (ref 0.0–0.5)
Eosinophils Relative: 3 %
HCT: 33.3 % — ABNORMAL LOW (ref 36.0–46.0)
Hemoglobin: 11.5 g/dL — ABNORMAL LOW (ref 12.0–15.0)
Immature Granulocytes: 1 %
Lymphocytes Relative: 26 %
Lymphs Abs: 2 10*3/uL (ref 0.7–4.0)
MCH: 30 pg (ref 26.0–34.0)
MCHC: 34.5 g/dL (ref 30.0–36.0)
MCV: 86.9 fL (ref 80.0–100.0)
Monocytes Absolute: 0.6 10*3/uL (ref 0.1–1.0)
Monocytes Relative: 8 %
Neutro Abs: 4.8 10*3/uL (ref 1.7–7.7)
Neutrophils Relative %: 61 %
Platelets: 259 10*3/uL (ref 150–400)
RBC: 3.83 MIL/uL — ABNORMAL LOW (ref 3.87–5.11)
RDW: 12.2 % (ref 11.5–15.5)
WBC: 7.7 10*3/uL (ref 4.0–10.5)
nRBC: 0 % (ref 0.0–0.2)

## 2020-09-17 LAB — BASIC METABOLIC PANEL
Anion gap: 10 (ref 5–15)
BUN: 44 mg/dL — ABNORMAL HIGH (ref 8–23)
CO2: 25 mmol/L (ref 22–32)
Calcium: 8.9 mg/dL (ref 8.9–10.3)
Chloride: 103 mmol/L (ref 98–111)
Creatinine, Ser: 2.07 mg/dL — ABNORMAL HIGH (ref 0.44–1.00)
GFR, Estimated: 26 mL/min — ABNORMAL LOW (ref >60)
Glucose, Bld: 280 mg/dL — ABNORMAL HIGH (ref 70–99)
Potassium: 4.7 mmol/L (ref 3.5–5.1)
Sodium: 138 mmol/L (ref 135–145)

## 2020-09-17 LAB — TSH: TSH: 2.614 u[IU]/mL (ref 0.350–4.500)

## 2020-09-17 MED ORDER — SODIUM CHLORIDE 0.9 % IV BOLUS
500.0000 mL | Freq: Once | INTRAVENOUS | Status: AC
  Administered 2020-09-17: 500 mL via INTRAVENOUS

## 2020-09-17 MED ORDER — ORPHENADRINE CITRATE 30 MG/ML IJ SOLN
30.0000 mg | Freq: Once | INTRAMUSCULAR | Status: AC
  Administered 2020-09-17: 30 mg via INTRAVENOUS
  Filled 2020-09-17: qty 2

## 2020-09-17 NOTE — ED Notes (Signed)
See triage note  presents with pain and swelling to left side of neck  states she noticed this last weds  was seen by PCP on Thursday and states she had x-rays done    having some pain with swallowing

## 2020-09-17 NOTE — ED Triage Notes (Signed)
Patient ambulatory to triage with steady gait, without difficulty or distress noted; pt reports that she had an xray of her neck on Thursday and was called by her doctor and told that she needed an orthopedic referral but has not been given an appointment yet; st pain persists and increases with any ROM of head or left arm

## 2020-09-17 NOTE — Discharge Instructions (Addendum)
Continue taking the Vicodin as prescribed.   You have a thyroid nodule that is likely the cause of your symptoms. Please follow up with primary care. Call today to request an appointment.  Return to the ER for symptoms that change or worsen if unable to see primary care.

## 2020-09-17 NOTE — ED Provider Notes (Signed)
[facility] Emergency Department Provider Note ____________________________________________   Event Date/Time   First MD Initiated Contact with Patient 09/17/20 [phone]     (approximate)  I have reviewed the triage vital signs and the nursing notes.   HISTORY  Chief Complaint Torticollis  HPI Karen [name] is a 65 y.o. female with history of alcoholic pancreatitis, diabetes, hypertension and remaining history as listed below presents to the emergency department for treatment and evaluation of pain in the left side of her neck that started 6 days ago.  She was evaluated by her primary care provider on Thursday and was advised that she would need a referral to see a specialist.  She has not yet received an appointment.  She states upon awakening this morning she had swelling in the left side of her neck and difficulty swallowing and turning her head to the left.  She denies any injury.  She denies having had the symptoms in the past..  She denies any weakness in her extremities, facial droop, headache, blurry vision, or other symptoms of concern.         Past Medical History:  Diagnosis Date   Alcoholic pancreatitis    Asthma    Depression    Diabetes mellitus without complication ([place])    DKA (diabetic ketoacidoses) 02/17/2015   Emphysematous cystitis    Hypercholesteremia    Hypertension    Hypokalemia     Patient Active Problem List   Diagnosis Date Noted   Epigastric pain 05/18/2020   Acute cystitis 05/18/2020   Intractable nausea and vomiting 05/17/2020   Stroke-like symptom 11/15/2019   Itching    Diabetic polyneuropathy associated with type 2 diabetes mellitus ([place])    Leukocytosis 06/13/2019   Chronic kidney disease (CKD), stage II (mild)    CKD (chronic kidney disease), stage IIIa 03/15/2019   HTN (hypertension) 03/15/2019   Type II diabetes mellitus with renal manifestations ([place]) 03/15/2019   Headache 02/26/2019   Dizziness 02/26/2019    Hyperglycemia due to type 2 diabetes mellitus ([place]) 01/30/2019   Acute pancreatitis 01/30/2019   Constipation    Acute on chronic pancreatitis ([place]) 01/25/2019   Acute pain of left shoulder    Adhesive capsulitis of left shoulder    Abnormal LFTs 01/24/2019   Cough 01/24/2019   Hypertension    HLD (hyperlipidemia)    Type 2 diabetes mellitus with hyperlipidemia (HCC)    GERD (gastroesophageal reflux disease)    Acute kidney injury superimposed on CKD ([place])    Pancreatitis, recurrent    Chronic pancreatitis ([place]) 09/25/2018   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   Coffee ground emesis    Upper GI bleed 11/11/2017   Pancreatitis, acute 01/03/2017   ETOH abuse 01/03/2017   UTI (urinary tract infection) 01/03/2017   Nausea and vomiting    Esophageal candidiasis (HCC)    Intractable vomiting with nausea    Hypertensive urgency 04/21/2016   Abdominal pain    Nausea vomiting and diarrhea    Smoker    Poorly controlled type 2 diabetes mellitus ([place])    Acute renal insufficiency 10/29/2015   Elevated troponin 10/29/2015   Urinary tract infection without hematuria 10/29/2015   Left flank pain 10/29/2015   Malignant essential hypertension 10/29/2015   DKA (diabetic ketoacidoses) 02/17/2015   Emphysematous cystitis     Past Surgical History:  Procedure Laterality Date   [place]   ESOPHAGOGASTRODUODENOSCOPY N/A 11/12/2017   Procedure: ESOPHAGOGASTRODUODENOSCOPY (  EGD);  Surgeon: Lin Landsman, MD;  Location: [facility] ENDOSCOPY;  Service: Gastroenterology;  Laterality: N/A;   ESOPHAGOGASTRODUODENOSCOPY N/A 09/29/2018   Procedure: ESOPHAGOGASTRODUODENOSCOPY (EGD);  Surgeon: Toledo, Benay Pike, MD;  Location: [facility] ENDOSCOPY;  Service: Gastroenterology;  Laterality: N/A;   ESOPHAGOGASTRODUODENOSCOPY (EGD) WITH PROPOFOL N/A 04/24/2016   Procedure: ESOPHAGOGASTRODUODENOSCOPY (EGD) WITH PROPOFOL;  Surgeon: [name], MD;  Location: [facility] ENDOSCOPY;   Service: Endoscopy;  Laterality: N/A;   ESOPHAGOGASTRODUODENOSCOPY (EGD) WITH PROPOFOL N/A 01/28/2019   Procedure: ESOPHAGOGASTRODUODENOSCOPY (EGD) WITH PROPOFOL;  Surgeon: Toledo, Benay Pike, MD;  Location: [facility] ENDOSCOPY;  Service: Gastroenterology;  Laterality: N/A;   EYE SURGERY     HAND [place]   THYROID SURGERY  2013    Prior to Admission medications   Medication Sig Start Date End Date Taking? Authorizing Provider  amLODipine (NORVASC) 10 MG tablet Take 1 tablet (10 mg total) by mouth daily. 11/17/19   Pahwani, Michell Heinrich, MD  ARIPiprazole (ABILIFY) 5 MG tablet Take 1 tablet (5 mg total) by mouth daily. 11/17/19   Pahwani, Michell Heinrich, MD  cetirizine (ZYRTEC) 5 MG tablet Take 1 tablet (5 mg total) by mouth daily. 11/17/19 12/17/19  Pahwani, Michell Heinrich, MD  cyclobenzaprine (FLEXERIL) 5 MG tablet Take 5 mg by mouth 2 (two) times daily as needed for muscle spasms.     [provider]  docusate sodium (COLACE) 100 MG capsule Take 1 capsule (100 mg total) by mouth 2 (two) times daily. 11/17/19   Pahwani, Michell Heinrich, MD  famotidine (PEPCID) 20 MG tablet Take 1 tablet (20 mg total) by mouth 2 (two) times daily. 11/17/19   Pahwani, Michell Heinrich, MD  HYDROcodone-acetaminophen (NORCO/VICODIN) 5-325 MG tablet One tablet every 6 hours as needed for severe pain. 11/17/19   Pahwani, Michell Heinrich, MD  insulin aspart (NOVOLOG) 100 UNIT/ML FlexPen Inject 2 Units into the skin 3 (three) times daily with meals. 11/17/19   Pahwani, Michell Heinrich, MD  insulin glargine (LANTUS) 100 UNIT/ML injection Inject 0.15 mLs (15 Units total) into the skin at bedtime. 05/24/20   Wyvonnia Dusky, MD  Insulin Pen Needle 29G X 12MM MISC 1 Dose by Does not apply route 3 (three) times daily before meals. 06/18/19   Loletha Grayer, MD  lipase/protease/amylase (CREON) 36000 UNITS CPEP capsule Take 2 capsules (72,000 Units total) by mouth 3 (three) times daily with meals. 11/17/19   Pahwani, Michell Heinrich, MD  omeprazole (PRILOSEC) 40 MG capsule  Take 1 capsule (40 mg total) by mouth in the morning and at bedtime. 11/17/19   Pahwani, Michell Heinrich, MD  ondansetron (ZOFRAN ODT) 4 MG disintegrating tablet Take 1 tablet (4 mg total) by mouth every 8 (eight) hours as needed for nausea or vomiting. 04/23/20   Blake Divine, MD  ondansetron (ZOFRAN ODT) 4 MG disintegrating tablet Take 1 tablet (4 mg total) by mouth every 8 (eight) hours as needed for nausea or vomiting. 05/15/20   Vladimir Crofts, MD  Polyethylene Glycol 3350 POWD Take 17 g by mouth at bedtime. Mix 17 grams in 4-8 oz of liquid and drink     [provider]  pregabalin (LYRICA) 75 MG capsule Take 1 capsule (75 mg total) by mouth 2 (two) times daily. 11/17/19   Pahwani, Michell Heinrich, MD  rosuvastatin (CRESTOR) 20 MG tablet Take 1 tablet (20 mg total) by mouth daily. 11/17/19   Pahwani, Michell Heinrich, MD  simethicone (MYLICON) 80 MG chewable tablet Chew 1 tablet (80 mg total) by mouth 4 (four) times  daily. Patient taking differently: Chew 80 mg by mouth as needed.  06/18/19   Loletha Grayer, MD  triamcinolone cream (KENALOG) 0.5 % Apply twice a day to back of thighs 06/18/19   Loletha Grayer, MD  VASCEPA 0.5 g CAPS Take 4 capsules by mouth in the morning and at bedtime. 11/17/19   Pahwani, Michell Heinrich, MD  zolpidem (AMBIEN) 10 MG tablet Take 1 tablet by mouth at bedtime. 05/10/19   [provider]  GLUCERNA (GLUCERNA) LIQD Take 237 mLs by mouth 3 (three) times daily between meals. 02/19/15 02/27/15  Bettey Costa, MD    Allergies Penicillins, Reglan [metoclopramide], Fentanyl, and Percocet [oxycodone-acetaminophen]  Family History  Problem Relation Age of Onset   Diabetes Mother     Social History Social History   Tobacco Use   Smoking status: Former   Smokeless tobacco: Never   Tobacco comments:    quit 7 years ago   Vaping Use   Vaping Use: Never used  Substance Use Topics   Alcohol use: Not Currently    Comment: quit drinking 2 years ago    Drug use: No    Review of  Systems  Constitutional: No fever/chills Eyes: No visual changes. ENT: No sore throat. Cardiovascular: Denies chest pain. Respiratory: Denies shortness of breath. Gastrointestinal: No abdominal pain.  No nausea, no vomiting.  No diarrhea.  No constipation. Genitourinary: Negative for dysuria. Musculoskeletal: Negative for back pain. Skin: Negative for rash. Neurological: Negative for headaches, focal weakness or numbness  ____________________________________________   PHYSICAL EXAM:  VITAL SIGNS: ED Triage Vitals  Enc Vitals Group     BP 09/17/20 0652 (!) 177/85     Pulse Rate 09/17/20 0652 87     Resp 09/17/20 0652 18     Temp 09/17/20 0652 98.8 F (37.1 C)     Temp src --      SpO2 09/17/20 0652 98 %     Weight 09/17/20 0653 115 lb (52.2 kg)     Height 09/17/20 0653 '5\' 1"'$  (1.549 m)     Head Circumference --      Peak Flow --      Pain Score 09/17/20 0653 9     Pain Loc --      Pain Edu? --      Excl. in [place]? --     Constitutional: Alert and oriented. Well appearing and in no acute distress. Eyes: Conjunctivae are normal.  Head: Atraumatic. Nose: No congestion/rhinnorhea. Mouth/Throat: Mucous membranes are moist. Oropharynx non-erythematous. Neck: No stridor. Fluctuant, tender, non erythematous area overlying the area of the brachial plexus. No palpable adenopathy. Able to swallow on command. No focal tenderness midline cervical spine. Hematological/Lymphatic/Immunilogical: No cervical lymphadenopathy. Cardiovascular: Normal rate, regular rhythm. Grossly normal heart sounds.  Good peripheral circulation. Respiratory: Normal respiratory effort.  No retractions. Lungs CTAB. Gastrointestinal: Soft and nontender. No distention. No abdominal bruits. No CVA tenderness. Genitourinary:  Musculoskeletal: No lower extremity tenderness nor edema.  No joint effusions. Neurologic:  Normal speech and language. No gross focal neurologic deficits are appreciated. No gait  instability. Skin:  Skin is warm, dry and intact. No rash noted. Psychiatric: Mood and affect are normal. Speech and behavior are normal.  ____________________________________________   LABS (all labs ordered are listed, but only abnormal results are displayed)  Labs Reviewed  BASIC METABOLIC PANEL - Abnormal; Notable for the following components:      Result Value   Glucose, Bld 280 (*)    BUN 44 (*)  Creatinine, Ser 2.07 (*)    GFR, Estimated 26 (*)    All other components within normal limits  CBC WITH DIFFERENTIAL/PLATELET - Abnormal; Notable for the following components:   RBC 3.83 (*)    Hemoglobin 11.5 (*)    HCT 33.3 (*)    All other components within normal limits  TSH   ____________________________________________  EKG  Not indicated. ____________________________________________  RADIOLOGY  ED MD interpretation:    Cervical spine without acute concerns. Soft tissue neck shows a 1.5 cm left thyroid nodule.  I, Sherrie George, personally viewed and evaluated these images (plain radiographs) as part of my medical decision making, as well as reviewing the written report by the radiologist.  Official radiology report(s): CT SOFT TISSUE NECK WO CONTRAST  Result Date: 09/17/2020 CLINICAL DATA:  Cranial neuropathy EXAM: CT NECK WITHOUT CONTRAST TECHNIQUE: Multidetector CT imaging of the neck was performed following the standard protocol without intravenous contrast. COMPARISON:  None. FINDINGS: Pharynx and larynx: Unremarkable.  No mass or swelling. Salivary glands: Parotid and submandibular glands are unremarkable. Thyroid: Hypodense left thyroid nodule measures 1.5 cm. Post right thyroidectomy. Lymph nodes: No enlarged or abnormal density nodes. Vascular: Calcified plaque at the common carotid bifurcations. Limited intracranial: No acute abnormality. Visualized orbits: Right lens replacement. Mastoids and visualized paranasal sinuses: No significant opacification.  Skeleton: Cervical spine degenerative changes, greatest at C5-C6. Upper chest: No apical lung mass. Other: Unremarkable. IMPRESSION: 1.5 cm left thyroid nodule. Ultrasound is recommended if not previously performed. Otherwise no neck mass or adenopathy. Electronically Signed   By: Macy Mis M.D.   On: 09/17/2020 09:31    ____________________________________________   PROCEDURES  Procedure(s) performed (including Critical Care):  Procedures  ____________________________________________   INITIAL IMPRESSION / ASSESSMENT AND PLAN     65 year old female presenting to the emergency department for treatment and evaluation of neck pain.  See HPI for further details.  Patient is unable to tell me if the referral was to be evaluated by orthopedics or ENT.  Because she states that the swelling in the left side of the neck started this morning with increase in difficulty swallowing, will get a CT soft tissue neck and see if they can also look at the C-spine.  DIFFERENTIAL DIAGNOSIS  Globus hystericus, thyroid nodule, soft tissue mass, acute or chronic cervical spine pathology.  ED COURSE  CT shows a thyroid nodule but otherwise no acute findings.  Patient will be sent back to primary care for further evaluation.  TSH is within normal limits today.  Patient will be encouraged to continue taking her Vicodin as prescribed.    ___________________________________________   FINAL CLINICAL IMPRESSION(S) / ED DIAGNOSES  Final diagnoses:  Thyroid nodule     ED Discharge Orders     None        RIKKI-LEE CORCORAN was evaluated in Emergency Department on 09/17/2020 for the symptoms described in the history of present illness. She was evaluated in the context of the global COVID-19 pandemic, which necessitated consideration that the patient might be at risk for infection with the SARS-CoV-2 virus that causes COVID-19. Institutional protocols and algorithms that pertain to the evaluation of  patients at risk for COVID-19 are in a state of rapid change based on information released by regulatory bodies including the CDC and federal and state organizations. These policies and algorithms were followed during the patient's care in the ED.   Note:  This document was prepared using Dragon voice recognition software and may include unintentional  dictation errors.    Victorino Dike, FNP 09/17/20 1100    Naaman Plummer, MD 09/17/20 [phone]

## 2020-09-29 ENCOUNTER — Emergency Department: Payer: Medicaid Other

## 2020-09-29 ENCOUNTER — Emergency Department
Admission: EM | Admit: 2020-09-29 | Discharge: 2020-09-29 | Disposition: A | Discharge status: Home / Self Care | Payer: Medicaid Other | Attending: Emergency Medicine | Admitting: Emergency Medicine

## 2020-09-29 ENCOUNTER — Other Ambulatory Visit: Payer: Self-pay

## 2020-09-29 DIAGNOSIS — J45909 Unspecified asthma, uncomplicated: Secondary | ICD-10-CM | POA: Insufficient documentation

## 2020-09-29 DIAGNOSIS — N1831 Chronic kidney disease, stage 3a: Secondary | ICD-10-CM | POA: Insufficient documentation

## 2020-09-29 DIAGNOSIS — Z794 Long term (current) use of insulin: Secondary | ICD-10-CM | POA: Diagnosis not present

## 2020-09-29 DIAGNOSIS — M25512 Pain in left shoulder: Secondary | ICD-10-CM | POA: Insufficient documentation

## 2020-09-29 DIAGNOSIS — E1122 Type 2 diabetes mellitus with diabetic chronic kidney disease: Secondary | ICD-10-CM | POA: Insufficient documentation

## 2020-09-29 DIAGNOSIS — M4312 Spondylolisthesis, cervical region: Secondary | ICD-10-CM

## 2020-09-29 DIAGNOSIS — Z79899 Other long term (current) drug therapy: Secondary | ICD-10-CM | POA: Diagnosis not present

## 2020-09-29 DIAGNOSIS — Z87891 Personal history of nicotine dependence: Secondary | ICD-10-CM | POA: Insufficient documentation

## 2020-09-29 DIAGNOSIS — I129 Hypertensive chronic kidney disease with stage 1 through stage 4 chronic kidney disease, or unspecified chronic kidney disease: Secondary | ICD-10-CM | POA: Diagnosis not present

## 2020-09-29 DIAGNOSIS — M4722 Other spondylosis with radiculopathy, cervical region: Secondary | ICD-10-CM | POA: Diagnosis not present

## 2020-09-29 DIAGNOSIS — M542 Cervicalgia: Secondary | ICD-10-CM | POA: Diagnosis present

## 2020-09-29 MED ORDER — HYDROCODONE-ACETAMINOPHEN 5-325 MG PO TABS
1.0000 | ORAL_TABLET | Freq: Once | ORAL | Status: AC
  Administered 2020-09-29: 1 via ORAL
  Filled 2020-09-29: qty 1

## 2020-09-29 MED ORDER — ORPHENADRINE CITRATE 30 MG/ML IJ SOLN
60.0000 mg | Freq: Two times a day (BID) | INTRAMUSCULAR | Status: DC
  Administered 2020-09-29: 60 mg via INTRAMUSCULAR
  Filled 2020-09-29: qty 2

## 2020-09-29 MED ORDER — DEXAMETHASONE SODIUM PHOSPHATE 10 MG/ML IJ SOLN
10.0000 mg | Freq: Once | INTRAMUSCULAR | Status: AC
  Administered 2020-09-29: 10 mg via INTRAMUSCULAR
  Filled 2020-09-29: qty 1

## 2020-09-29 MED ORDER — CYCLOBENZAPRINE HCL 10 MG PO TABS: 10.0000 mg | ORAL_TABLET | Freq: Three times a day (TID) | ORAL | 0 refills | Status: DC | PRN

## 2020-09-29 NOTE — ED Notes (Signed)
See triage note. Pt ambulatory to room. Pt c/o of nodule on left side of neck that was previously seen for. Pt stating difficulty swallowing. Airway intact. Pt has follow up app but pain has gotten worse

## 2020-09-29 NOTE — Discharge Instructions (Addendum)
You were seen today for neck and left shoulder pain.  Your x-ray is consistent with arthritic changes in your neck.  You should continue your hydrocodone.  I am increasing your Flexeril to 10 mg 3 times a day.  We have placed you in a soft collar for comfort.  Please call the neurosurgeon to follow-up with next week.

## 2020-09-29 NOTE — ED Provider Notes (Signed)
[facility] Emergency Department Provider Note ____________________________________________  Time seen: 0730  I have reviewed the triage vital signs and the nursing notes.  HISTORY  Chief Complaint  Thyroid Nodule   HPI Karen [name] is a 65 y.o. female presents to the ER today with complaint of left-sided neck pain and left shoulder pain.  She reports this started 3 weeks ago.  She describes the pain as sore, achy, sharp and stabbing.  She reports the pain will radiate into her left bicep as well as down her right arm.  She denies numbness, tingling or weakness.  She denies any injury to the area.  She denies any headaches, dizziness or vision changes. She has had some difficulty swallowing but reports this has been an ongoing issue. She reports she has not been taking any medications OTC for this however she is prescribed Vicodin.  She reports she was seen 9/13 for the same, diagnosed with a 1.5 cm left thyroid nodule.  She has an appointment on October 4 for further evaluation of this.  Her TSH at her last ER visit was normal.  Past Medical History:  Diagnosis Date   Alcoholic pancreatitis    Asthma    Depression    Diabetes mellitus without complication ([place])    DKA (diabetic ketoacidoses) 02/17/2015   Emphysematous cystitis    Hypercholesteremia    Hypertension    Hypokalemia     Patient Active Problem List   Diagnosis Date Noted   Epigastric pain 05/18/2020   Acute cystitis 05/18/2020   Intractable nausea and vomiting 05/17/2020   Stroke-like symptom 11/15/2019   Itching    Diabetic polyneuropathy associated with type 2 diabetes mellitus ([place])    Leukocytosis 06/13/2019   Chronic kidney disease (CKD), stage II (mild)    CKD (chronic kidney disease), stage IIIa 03/15/2019   HTN (hypertension) 03/15/2019   Type II diabetes mellitus with renal manifestations ([place]) 03/15/2019   Headache 02/26/2019   Dizziness 02/26/2019   Hyperglycemia due to type 2  diabetes mellitus ([place]) 01/30/2019   Acute pancreatitis 01/30/2019   Constipation    Acute on chronic pancreatitis ([place]) 01/25/2019   Acute pain of left shoulder    Adhesive capsulitis of left shoulder    Abnormal LFTs 01/24/2019   Cough 01/24/2019   Hypertension    HLD (hyperlipidemia)    Type 2 diabetes mellitus with hyperlipidemia (HCC)    GERD (gastroesophageal reflux disease)    Acute kidney injury superimposed on CKD ([place])    Pancreatitis, recurrent    Chronic pancreatitis ([place]) 09/25/2018   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   Coffee ground emesis    Upper GI bleed 11/11/2017   Pancreatitis, acute 01/03/2017   ETOH abuse 01/03/2017   UTI (urinary tract infection) 01/03/2017   Nausea and vomiting    Esophageal candidiasis (HCC)    Intractable vomiting with nausea    Hypertensive urgency 04/21/2016   Abdominal pain    Nausea vomiting and diarrhea    Smoker    Poorly controlled type 2 diabetes mellitus ([place])    Acute renal insufficiency 10/29/2015   Elevated troponin 10/29/2015   Urinary tract infection without hematuria 10/29/2015   Left flank pain 10/29/2015   Malignant essential hypertension 10/29/2015   DKA (diabetic ketoacidoses) 02/17/2015   Emphysematous cystitis     Past Surgical History:  Procedure Laterality Date   [place]   ESOPHAGOGASTRODUODENOSCOPY N/A 11/12/2017   Procedure: ESOPHAGOGASTRODUODENOSCOPY (EGD);  Surgeon: Lin Landsman, MD;  Location: [facility] ENDOSCOPY;  Service: Gastroenterology;  Laterality: N/A;   ESOPHAGOGASTRODUODENOSCOPY N/A 09/29/2018   Procedure: ESOPHAGOGASTRODUODENOSCOPY (EGD);  Surgeon: Toledo, Benay Pike, MD;  Location: [facility] ENDOSCOPY;  Service: Gastroenterology;  Laterality: N/A;   ESOPHAGOGASTRODUODENOSCOPY (EGD) WITH PROPOFOL N/A 04/24/2016   Procedure: ESOPHAGOGASTRODUODENOSCOPY (EGD) WITH PROPOFOL;  Surgeon: [name], MD;  Location: [facility] ENDOSCOPY;  Service: Endoscopy;   Laterality: N/A;   ESOPHAGOGASTRODUODENOSCOPY (EGD) WITH PROPOFOL N/A 01/28/2019   Procedure: ESOPHAGOGASTRODUODENOSCOPY (EGD) WITH PROPOFOL;  Surgeon: Toledo, Benay Pike, MD;  Location: [facility] ENDOSCOPY;  Service: Gastroenterology;  Laterality: N/A;   EYE SURGERY     HAND [place]   THYROID SURGERY  2013    Prior to Admission medications   Medication Sig Start Date End Date Taking? Authorizing Provider  cyclobenzaprine (FLEXERIL) 10 MG tablet Take 1 tablet (10 mg total) by mouth 3 (three) times daily as needed for muscle spasms. 09/29/20  Yes Jearld Fenton, NP  amLODipine (NORVASC) 10 MG tablet Take 1 tablet (10 mg total) by mouth daily. 11/17/19   Pahwani, Michell Heinrich, MD  ARIPiprazole (ABILIFY) 5 MG tablet Take 1 tablet (5 mg total) by mouth daily. 11/17/19   Pahwani, Michell Heinrich, MD  cetirizine (ZYRTEC) 5 MG tablet Take 1 tablet (5 mg total) by mouth daily. 11/17/19 12/17/19  Pahwani, Michell Heinrich, MD  docusate sodium (COLACE) 100 MG capsule Take 1 capsule (100 mg total) by mouth 2 (two) times daily. 11/17/19   Pahwani, Michell Heinrich, MD  famotidine (PEPCID) 20 MG tablet Take 1 tablet (20 mg total) by mouth 2 (two) times daily. 11/17/19   Pahwani, Michell Heinrich, MD  HYDROcodone-acetaminophen (NORCO/VICODIN) 5-325 MG tablet One tablet every 6 hours as needed for severe pain. 11/17/19   Pahwani, Michell Heinrich, MD  insulin aspart (NOVOLOG) 100 UNIT/ML FlexPen Inject 2 Units into the skin 3 (three) times daily with meals. 11/17/19   Pahwani, Michell Heinrich, MD  insulin glargine (LANTUS) 100 UNIT/ML injection Inject 0.15 mLs (15 Units total) into the skin at bedtime. 05/24/20   Wyvonnia Dusky, MD  Insulin Pen Needle 29G X 12MM MISC 1 Dose by Does not apply route 3 (three) times daily before meals. 06/18/19   Loletha Grayer, MD  lipase/protease/amylase (CREON) 36000 UNITS CPEP capsule Take 2 capsules (72,000 Units total) by mouth 3 (three) times daily with meals. 11/17/19   Pahwani, Michell Heinrich, MD  omeprazole (PRILOSEC) 40 MG  capsule Take 1 capsule (40 mg total) by mouth in the morning and at bedtime. 11/17/19   Pahwani, Michell Heinrich, MD  ondansetron (ZOFRAN ODT) 4 MG disintegrating tablet Take 1 tablet (4 mg total) by mouth every 8 (eight) hours as needed for nausea or vomiting. 04/23/20   Blake Divine, MD  ondansetron (ZOFRAN ODT) 4 MG disintegrating tablet Take 1 tablet (4 mg total) by mouth every 8 (eight) hours as needed for nausea or vomiting. 05/15/20   Vladimir Crofts, MD  Polyethylene Glycol 3350 POWD Take 17 g by mouth at bedtime. Mix 17 grams in 4-8 oz of liquid and drink     [provider]  pregabalin (LYRICA) 75 MG capsule Take 1 capsule (75 mg total) by mouth 2 (two) times daily. 11/17/19   Pahwani, Michell Heinrich, MD  rosuvastatin (CRESTOR) 20 MG tablet Take 1 tablet (20 mg total) by mouth daily. 11/17/19   Pahwani, Michell Heinrich, MD  simethicone (MYLICON) 80 MG chewable tablet Chew 1 tablet (80 mg total) by mouth 4 (four)  times daily. Patient taking differently: Chew 80 mg by mouth as needed.  06/18/19   Loletha Grayer, MD  triamcinolone cream (KENALOG) 0.5 % Apply twice a day to back of thighs 06/18/19   Loletha Grayer, MD  VASCEPA 0.5 g CAPS Take 4 capsules by mouth in the morning and at bedtime. 11/17/19   Pahwani, Michell Heinrich, MD  zolpidem (AMBIEN) 10 MG tablet Take 1 tablet by mouth at bedtime. 05/10/19   [provider]  GLUCERNA (GLUCERNA) LIQD Take 237 mLs by mouth 3 (three) times daily between meals. 02/19/15 02/27/15  Bettey Costa, MD    Allergies Penicillins, Reglan [metoclopramide], Fentanyl, and Percocet [oxycodone-acetaminophen]  Family History  Problem Relation Age of Onset   Diabetes Mother     Social History Social History   Tobacco Use   Smoking status: Former   Smokeless tobacco: Never   Tobacco comments:    quit 7 years ago   Vaping Use   Vaping Use: Never used  Substance Use Topics   Alcohol use: Not Currently    Comment: quit drinking 2 years ago    Drug use: No     Review of Systems  Constitutional: Negative for fever, chills or body aches. Eyes: Negative for visual changes. ENT: Negative for runny nose, nasal congestion, ear pain or sore throat. Cardiovascular: Negative for chest pain or chest tightness. Respiratory: Negative for cough or shortness of breath. Musculoskeletal: Positive for left-sided neck pain, left shoulder pain. Skin: Negative for rash. Neurological: Negative for headaches,, dizziness, focal weakness, tingling or numbness. ____________________________________________  PHYSICAL EXAM:  VITAL SIGNS: ED Triage Vitals  Enc Vitals Group     BP 09/29/20 0711 (!) 98/54     Pulse Rate 09/29/20 0711 94     Resp 09/29/20 0711 18     Temp 09/29/20 0711 98.7 F (37.1 C)     Temp Source 09/29/20 0711 Oral     SpO2 09/29/20 0711 97 %     Weight --      Height 09/29/20 0712 5\' 1"  (1.549 m)     Head Circumference --      Peak Flow --      Pain Score 09/29/20 0712 8     Pain Loc --      Pain Edu? --      Excl. in [place]? --     Constitutional: Alert and oriented.  Appears in pain but in no distress. Head: Normocephalic. Eyes: Normal extraocular movements Mouth/Throat: Mucous membranes are moist.  No posterior pharynx erythema or exudate noted. Hematological/Lymphatic/Immunological: No cervical lymphadenopathy.  Unable to palpate thyroid nodule noted on recent CT scan. Cardiovascular: Normal rate, regular rhythm.  Radial pulses 2+ bilaterally. Respiratory: Normal respiratory effort. No wheezes/rales/rhonchi. Musculoskeletal: Decreased flexion of the cervical spine.  Normal extension and rotation but with noted pain.  No bony tenderness noted over the cervical spine.  Pain with palpation of the left paracervical muscles.  Normal internal and external rotation of the left shoulder.  Pain with palpation over the left AC joint, left anterior proximal biceps tendon and left subacromial bursa.  Strength 5/5 BUE.  Handgrips equal.  Shoulder  shrug equal. Neurologic:  Normal speech and language. No gross focal neurologic deficits are appreciated. Skin:  Skin is warm, dry and intact. No rash noted.  ____________________________________________   RADIOLOGY Imaging Orders         DG Cervical Spine 2-3 Views         DG Shoulder Left  IMPRESSION: 1. No acute osseous abnormality identified in the cervical spine. 2. Mild degenerative anterolisthesis with moderate facet arthropathy at C4-C5. Bulky chronic disc and endplate degeneration at C5-C6 and C6-C7. IMPRESSION: Negative.  ____________________________________________   INITIAL IMPRESSION / ASSESSMENT AND PLAN / ED COURSE  Acute Left Sided Neck Pain, Acute Left Shoulder Pain:  DDx include osteoarthritis of the neck, torticollis, muscle strain of the neck, cervical radiculitis, osteoarthritis of the left shoulder, left biceps tendonitis, left subacromial bursitis, left shoulder impingement. Xray cervical spine Xray left shoulder Decadron 10 mg IM- will likely cause elevated blood sugars but unable to give Toradol secondary to CKD Norflex 60 mg IM x 1 Norco 5-325 mg PO x 1 She is requesting a neck brace- soft collar applied for comfort She is requesting Percocet- advised her she has Norco at home, she will need to continue to take this. She should continue Pregabalin Will increase her Flexeril dose to 10 mg TID prn Will have her follow up with neurosurgery as an outpatient      I reviewed the patient's prescription history over the last 12 months in the multi-state controlled substances database(s) that includes [place], [place], [place], [place], [place], [place], [place], [place], [place], [place], [place], [place], [place], and [place].  Results were notable for Hydrocodone-Acetaminophen 5-325 mg #120 09/26/20. ____________________________________________  FINAL CLINICAL IMPRESSION(S) / ED DIAGNOSES  Final diagnoses:   Osteoarthritis of spine with radiculopathy, cervical region  Anterolisthesis of cervical spine      Jearld Fenton, NP 09/29/20 1045    Naaman Plummer, MD 09/29/20 1226

## 2020-09-29 NOTE — ED Triage Notes (Addendum)
Pt to ER via POV with complaints of left sided neck swelling and painful swallowing. Reports a heavy feeling, worst with movement. Reports symptoms have been ongoing for three weeks. Was previously seen here and diagnosed with a thyroid nodule. Reports that her outpatient follow up appointment isn't until October 4th and that she can't manage the pain. States she feels like she needs a neck brace.  Denies any new symptoms from previous visit.

## 2020-10-04 ENCOUNTER — Other Ambulatory Visit: Payer: Self-pay | Admitting: Neurosurgery

## 2020-10-04 ENCOUNTER — Other Ambulatory Visit (HOSPITAL_COMMUNITY): Payer: Self-pay | Admitting: Neurosurgery

## 2020-10-04 DIAGNOSIS — M5412 Radiculopathy, cervical region: Secondary | ICD-10-CM

## 2020-10-06 ENCOUNTER — Emergency Department: Payer: Medicaid Other

## 2020-10-06 ENCOUNTER — Other Ambulatory Visit: Payer: Self-pay

## 2020-10-06 ENCOUNTER — Emergency Department
Admission: EM | Admit: 2020-10-06 | Discharge: 2020-10-06 | Disposition: A | Discharge status: Home / Self Care | Payer: Medicaid Other | Attending: Emergency Medicine | Admitting: Emergency Medicine

## 2020-10-06 DIAGNOSIS — Z20822 Contact with and (suspected) exposure to covid-19: Secondary | ICD-10-CM | POA: Diagnosis not present

## 2020-10-06 DIAGNOSIS — E1122 Type 2 diabetes mellitus with diabetic chronic kidney disease: Secondary | ICD-10-CM | POA: Insufficient documentation

## 2020-10-06 DIAGNOSIS — Z87891 Personal history of nicotine dependence: Secondary | ICD-10-CM | POA: Insufficient documentation

## 2020-10-06 DIAGNOSIS — R059 Cough, unspecified: Secondary | ICD-10-CM | POA: Diagnosis not present

## 2020-10-06 DIAGNOSIS — E1142 Type 2 diabetes mellitus with diabetic polyneuropathy: Secondary | ICD-10-CM | POA: Insufficient documentation

## 2020-10-06 DIAGNOSIS — E1110 Type 2 diabetes mellitus with ketoacidosis without coma: Secondary | ICD-10-CM | POA: Insufficient documentation

## 2020-10-06 DIAGNOSIS — N1831 Chronic kidney disease, stage 3a: Secondary | ICD-10-CM | POA: Insufficient documentation

## 2020-10-06 DIAGNOSIS — J3489 Other specified disorders of nose and nasal sinuses: Secondary | ICD-10-CM | POA: Insufficient documentation

## 2020-10-06 DIAGNOSIS — J45909 Unspecified asthma, uncomplicated: Secondary | ICD-10-CM | POA: Diagnosis not present

## 2020-10-06 DIAGNOSIS — I129 Hypertensive chronic kidney disease with stage 1 through stage 4 chronic kidney disease, or unspecified chronic kidney disease: Secondary | ICD-10-CM | POA: Insufficient documentation

## 2020-10-06 DIAGNOSIS — R0981 Nasal congestion: Secondary | ICD-10-CM | POA: Insufficient documentation

## 2020-10-06 DIAGNOSIS — R0789 Other chest pain: Secondary | ICD-10-CM | POA: Diagnosis not present

## 2020-10-06 DIAGNOSIS — Z79899 Other long term (current) drug therapy: Secondary | ICD-10-CM | POA: Diagnosis not present

## 2020-10-06 DIAGNOSIS — R051 Acute cough: Secondary | ICD-10-CM

## 2020-10-06 DIAGNOSIS — Z794 Long term (current) use of insulin: Secondary | ICD-10-CM | POA: Insufficient documentation

## 2020-10-06 LAB — RESP PANEL BY RT-PCR (FLU A&B, COVID) ARPGX2
Influenza A by PCR: POSITIVE — AB
Influenza B by PCR: NEGATIVE
SARS Coronavirus 2 by RT PCR: NEGATIVE

## 2020-10-06 MED ORDER — BENZONATATE 100 MG PO CAPS: 100.0000 mg | ORAL_CAPSULE | Freq: Three times a day (TID) | ORAL | 0 refills | Status: AC | PRN

## 2020-10-06 NOTE — ED Provider Notes (Signed)
[facility]EMERGENCY DEPARTMENT  ____________________________________________  Time seen: Approximately 8:22 PM  I have reviewed the triage vital signs and the nursing notes.   HISTORY  Chief Complaint Cough   Historian Patient     HPI Karen [name] is a 65 y.o. female presents to the emergency department with cough that started last night as well as nasal congestion and rhinorrhea.  Patient states that she has had some anterior chest wall discomfort with coughing but no chest pain or shortness of breath.  No nausea, vomiting or diarrhea.  No recent travel.  No other alleviating measures have been attempted.   Past Medical History:  Diagnosis Date   Alcoholic pancreatitis    Asthma    Depression    Diabetes mellitus without complication ([place])    DKA (diabetic ketoacidoses) 02/17/2015   Emphysematous cystitis    Hypercholesteremia    Hypertension    Hypokalemia      Immunizations up to date:  Yes.     Past Medical History:  Diagnosis Date   Alcoholic pancreatitis    Asthma    Depression    Diabetes mellitus without complication ([place])    DKA (diabetic ketoacidoses) 02/17/2015   Emphysematous cystitis    Hypercholesteremia    Hypertension    Hypokalemia     Patient Active Problem List   Diagnosis Date Noted   Epigastric pain 05/18/2020   Acute cystitis 05/18/2020   Intractable nausea and vomiting 05/17/2020   Stroke-like symptom 11/15/2019   Itching    Diabetic polyneuropathy associated with type 2 diabetes mellitus ([place])    Leukocytosis 06/13/2019   Chronic kidney disease (CKD), stage II (mild)    CKD (chronic kidney disease), stage IIIa 03/15/2019   HTN (hypertension) 03/15/2019   Type II diabetes mellitus with renal manifestations ([place]) 03/15/2019   Headache 02/26/2019   Dizziness 02/26/2019   Hyperglycemia due to type 2 diabetes mellitus ([place]) 01/30/2019   Acute pancreatitis 01/30/2019   Constipation    Acute on chronic pancreatitis ([place]) 01/25/2019    Acute pain of left shoulder    Adhesive capsulitis of left shoulder    Abnormal LFTs 01/24/2019   Cough 01/24/2019   Hypertension    HLD (hyperlipidemia)    Type 2 diabetes mellitus with hyperlipidemia (HCC)    GERD (gastroesophageal reflux disease)    Acute kidney injury superimposed on CKD ([place])    Pancreatitis, recurrent    Chronic pancreatitis ([place]) 09/25/2018   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   Coffee ground emesis    Upper GI bleed 11/11/2017   Pancreatitis, acute 01/03/2017   ETOH abuse 01/03/2017   UTI (urinary tract infection) 01/03/2017   Nausea and vomiting    Esophageal candidiasis (HCC)    Intractable vomiting with nausea    Hypertensive urgency 04/21/2016   Abdominal pain    Nausea vomiting and diarrhea    Smoker    Poorly controlled type 2 diabetes mellitus ([place])    Acute renal insufficiency 10/29/2015   Elevated troponin 10/29/2015   Urinary tract infection without hematuria 10/29/2015   Left flank pain 10/29/2015   Malignant essential hypertension 10/29/2015   DKA (diabetic ketoacidoses) 02/17/2015   Emphysematous cystitis     Past Surgical History:  Procedure Laterality Date   [place]   ESOPHAGOGASTRODUODENOSCOPY N/A 11/12/2017   Procedure: ESOPHAGOGASTRODUODENOSCOPY (EGD);  Surgeon: Lin Landsman, MD;  Location[facility] ENDOSCOPY;  Service: Gastroenterology;  Laterality: N/A;   ESOPHAGOGASTRODUODENOSCOPY N/A 09/29/2018  Procedure: ESOPHAGOGASTRODUODENOSCOPY (EGD);  Surgeon: Tol[place], Benay Pike, MD;  Location: [facility] ENDOSCOPY;  Service: Gastroenterology;  Laterality: N/A;   ESOPHAGOGASTRODUODENOSCOPY (EGD) WITH PROPOFOL N/A 04/24/2016   Procedure: ESOPHAGOGASTRODUODENOSCOPY (EGD) WITH PROPOFOL;  Surgeon: [name], MD;  Location: [facility] ENDOSCOPY;  Service: Endoscopy;  Laterality: N/A;   ESOPHAGOGASTRODUODENOSCOPY (EGD) WITH PROPOFOL N/A 01/28/2019   Procedure: ESOPHAGOGASTRODUODENOSCOPY (EGD) WITH  PROPOFOL;  Surgeon: Toledo, Benay Pike, MD;  Location: [facility] ENDOSCOPY;  Service: Gastroenterology;  Laterality: N/A;   EYE SURGERY     HAND [place]   THYROID SURGERY  2013    Prior to Admission medications   Medication Sig Start Date End Date Taking? Authorizing Provider  amLODipine (NORVASC) 10 MG tablet Take 1 tablet (10 mg total) by mouth daily. 11/17/19   Pahwani, Michell Heinrich, MD  ARIPiprazole (ABILIFY) 5 MG tablet Take 1 tablet (5 mg total) by mouth daily. 11/17/19   Pahwani, Michell Heinrich, MD  benzonatate (TESSALON PERLES) 100 MG capsule Take 1 capsule (100 mg total) by mouth 3 (three) times daily as needed for up to 7 days for cough. 10/06/20 10/13/20 Yes Vallarie Mare M, PA-C  cetirizine (ZYRTEC) 5 MG tablet Take 1 tablet (5 mg total) by mouth daily. 11/17/19 12/17/19  Pahwani, Michell Heinrich, MD  cyclobenzaprine (FLEXERIL) 10 MG tablet Take 1 tablet (10 mg total) by mouth 3 (three) times daily as needed for muscle spasms. 09/29/20   Jearld Fenton, NP  docusate sodium (COLACE) 100 MG capsule Take 1 capsule (100 mg total) by mouth 2 (two) times daily. 11/17/19   Pahwani, Michell Heinrich, MD  famotidine (PEPCID) 20 MG tablet Take 1 tablet (20 mg total) by mouth 2 (two) times daily. 11/17/19   Pahwani, Michell Heinrich, MD  HYDROcodone-acetaminophen (NORCO/VICODIN) 5-325 MG tablet One tablet every 6 hours as needed for severe pain. 11/17/19   Pahwani, Michell Heinrich, MD  insulin aspart (NOVOLOG) 100 UNIT/ML FlexPen Inject 2 Units into the skin 3 (three) times daily with meals. 11/17/19   Pahwani, Michell Heinrich, MD  insulin glargine (LANTUS) 100 UNIT/ML injection Inject 0.15 mLs (15 Units total) into the skin at bedtime. 05/24/20   Wyvonnia Dusky, MD  Insulin Pen Needle 29G X 12MM MISC 1 Dose by Does not apply route 3 (three) times daily before meals. 06/18/19   Loletha Grayer, MD  lipase/protease/amylase (CREON) 36000 UNITS CPEP capsule Take 2 capsules (72,000 Units total) by mouth 3 (three) times daily with meals. 11/17/19    Pahwani, Michell Heinrich, MD  omeprazole (PRILOSEC) 40 MG capsule Take 1 capsule (40 mg total) by mouth in the morning and at bedtime. 11/17/19   Pahwani, Michell Heinrich, MD  ondansetron (ZOFRAN ODT) 4 MG disintegrating tablet Take 1 tablet (4 mg total) by mouth every 8 (eight) hours as needed for nausea or vomiting. 04/23/20   Blake Divine, MD  ondansetron (ZOFRAN ODT) 4 MG disintegrating tablet Take 1 tablet (4 mg total) by mouth every 8 (eight) hours as needed for nausea or vomiting. 05/15/20   Vladimir Crofts, MD  Polyethylene Glycol 3350 POWD Take 17 g by mouth at bedtime. Mix 17 grams in 4-8 oz of liquid and drink     [provider]  pregabalin (LYRICA) 75 MG capsule Take 1 capsule (75 mg total) by mouth 2 (two) times daily. 11/17/19   Pahwani, Michell Heinrich, MD  rosuvastatin (CRESTOR) 20 MG tablet Take 1 tablet (20 mg total) by mouth daily. 11/17/19   Pahwani, Michell Heinrich, MD  simethicone ([place]) 80  MG chewable tablet Chew 1 tablet (80 mg total) by mouth 4 (four) times daily. Patient taking differently: Chew 80 mg by mouth as needed.  06/18/19   Loletha Grayer, MD  triamcinolone cream (KENALOG) 0.5 % Apply twice a day to back of thighs 06/18/19   Loletha Grayer, MD  VASCEPA 0.5 g CAPS Take 4 capsules by mouth in the morning and at bedtime. 11/17/19   Pahwani, Michell Heinrich, MD  zolpidem (AMBIEN) 10 MG tablet Take 1 tablet by mouth at bedtime. 05/10/19   [provider]  GLUCERNA (GLUCERNA) LIQD Take 237 mLs by mouth 3 (three) times daily between meals. 02/19/15 02/27/15  Bettey Costa, MD    Allergies Penicillins, Reglan [metoclopramide], Fentanyl, and Percocet [oxycodone-acetaminophen]  Family History  Problem Relation Age of Onset   Diabetes Mother     Social History Social History   Tobacco Use   Smoking status: Former   Smokeless tobacco: Never   Tobacco comments:    quit 7 years ago   Vaping Use   Vaping Use: Never used  Substance Use Topics   Alcohol use: Not Currently    Comment:  quit drinking 2 years ago    Drug use: No     Review of Systems  Constitutional: No fever/chills Eyes:  No discharge ENT: Nasal congestion.  Respiratory: Patient has cough.  Gastrointestinal:   No nausea, no vomiting.  No diarrhea.  No constipation. Musculoskeletal: Negative for musculoskeletal pain. Skin: Negative for rash, abrasions, lacerations, ecchymosis.  ____________________________________________   PHYSICAL EXAM:  VITAL SIGNS: ED Triage Vitals  Enc Vitals Group     BP 10/06/20 1620 (!) 165/90     Pulse Rate 10/06/20 1620 (!) 117     Resp 10/06/20 1620 18     Temp 10/06/20 1620 99.7 F (37.6 C)     Temp Source 10/06/20 1620 Oral     SpO2 10/06/20 1620 97 %     Weight 10/06/20 1619 114 lb 10.2 oz (52 kg)     Height 10/06/20 1619 5\' 1"  (1.549 m)     Head Circumference --      Peak Flow --      Pain Score 10/06/20 1619 8     Pain Loc --      Pain Edu? --      Excl. in [place]? --      Constitutional: Alert and oriented. Patient is lying supine. Eyes: Conjunctivae are normal. PERRL. EOMI. Head: Atraumatic. ENT:      Ears: Tympanic membranes are mildly injected with mild effusion bilaterally.       Nose: No congestion/rhinnorhea.      Mouth/Throat: Mucous membranes are moist. Posterior pharynx is mildly erythematous.  Hematological/Lymphatic/Immunilogical: No cervical lymphadenopathy.  Cardiovascular: Normal rate, regular rhythm. Normal S1 and S2.  Good peripheral circulation. Respiratory: Normal respiratory effort without tachypnea or retractions. Lungs CTAB. Good air entry to the bases with no decreased or absent breath sounds. Gastrointestinal: Bowel sounds 4 quadrants. Soft and nontender to palpation. No guarding or rigidity. No palpable masses. No distention. No CVA tenderness. Musculoskeletal: Full range of motion to all extremities. No gross deformities appreciated. Neurologic:  Normal speech and language. No gross focal neurologic deficits are appreciated.   Skin:  Skin is warm, dry and intact. No rash noted. Psychiatric: Mood and affect are normal. Speech and behavior are normal. Patient exhibits appropriate insight and judgement.   ____________________________________________   LABS (all labs ordered are listed, but only abnormal results are displayed)  Labs Reviewed  RESP PANEL BY RT-PCR (FLU A&B, COVID) ARPGX2 - Abnormal; Notable for the following components:      Result Value   Influenza A by PCR POSITIVE (*)    All other components within normal limits   ____________________________________________  EKG   ____________________________________________  RADIOLOGY Unk Pinto, personally viewed and evaluated these images (plain radiographs) as part of my medical decision making, as well as reviewing the written report by the radiologist.  DG Chest 2 View  Result Date: 10/06/2020 CLINICAL DATA:  Cough beginning last night. EXAM: CHEST - 2 VIEW COMPARISON:  06/10/2020 FINDINGS: Lungs are adequately inflated and otherwise clear. Cardiomediastinal silhouette and remainder of the exam is unchanged. IMPRESSION: No active cardiopulmonary disease. Electronically Signed   By: Marin Olp M.D.   On: 10/06/2020 16:49    ____________________________________________    PROCEDURES  Procedure(s) performed:     Procedures     Medications - No data to display   ____________________________________________   INITIAL IMPRESSION / ASSESSMENT AND PLAN / ED COURSE  Pertinent labs & imaging results that were available during my care of the patient were reviewed by me and considered in my medical decision making (see chart for details).    Assessment and plan Influenza A 65 year old female presents to the emergency department with viral URI-like symptoms for the past 24 hours.  Chest x-ray showed no consolidations, opacities or infiltrates to suggest pneumonia.  Patient was mildly tachycardic at triage but vital signs  otherwise reassuring.  She had no increased work of breathing and no adventitious lung sounds were auscultated on exam.  She tested positive for influenza A.  Recommended rest and hydration at home as well as Tylenol and ibuprofen alternating for fever.  Return precautions were given to return with new or worsening symptoms.  All patient questions were answered.      ____________________________________________  FINAL CLINICAL IMPRESSION(S) / ED DIAGNOSES  Final diagnoses:  Acute cough      NEW MEDICATIONS STARTED DURING THIS VISIT:  ED Discharge Orders          Ordered    benzonatate (TESSALON PERLES) 100 MG capsule  3 times daily PRN        10/06/20 1833                This chart was dictated using voice recognition software/Dragon. Despite best efforts to proofread, errors can occur which can change the meaning. Any change was purely unintentional.     Karren Cobble 10/06/20 2027    Duffy Bruce, MD 10/06/20 2235

## 2020-10-06 NOTE — Discharge Instructions (Signed)
Take Tessalon Perles as needed for cough.

## 2020-10-06 NOTE — ED Triage Notes (Signed)
Pt comes pov with cough starting last night. States it kept her awake. Temp was 99.7 at home. States OTC meds aren't helping.

## 2020-10-06 NOTE — ED Notes (Signed)
Patient given discharge instructions, all questions answered. Patient in possession of all belongings, directed to the discharge area  

## 2020-10-08 ENCOUNTER — Other Ambulatory Visit: Payer: Self-pay | Admitting: Unknown Physician Specialty

## 2020-10-08 DIAGNOSIS — E041 Nontoxic single thyroid nodule: Secondary | ICD-10-CM

## 2020-10-15 ENCOUNTER — Ambulatory Visit: Payer: Medicaid Other | Attending: Unknown Physician Specialty

## 2020-10-16 ENCOUNTER — Ambulatory Visit: Admission: RE | Admit: 2020-10-16 | Payer: Medicaid Other | Source: Ambulatory Visit

## 2021-03-06 IMAGING — CT CT NECK W/ CM
3 of 4 series · 11 of 33 positions shown, 13 images · IV contrast (omnipaque)
Comparison: CT neck 12/01/2010

CLINICAL DATA: Tooth pulled on right side week ago. Difficulty
swallowing. Rule out foreign body.

EXAM:
CT NECK WITH CONTRAST
TECHNIQUE: Multidetector CT imaging of the neck was performed using the
standard protocol following the bolus administration of intravenous
contrast.
CONTRAST:  60mL OMNIPAQUE IOHEXOL 300 MG/ML  SOLN

[Series 5: sag neck · sagittal · 0.40mm/px · 5 of 69 slices shown, 6 images]
[im 23/69  bone]
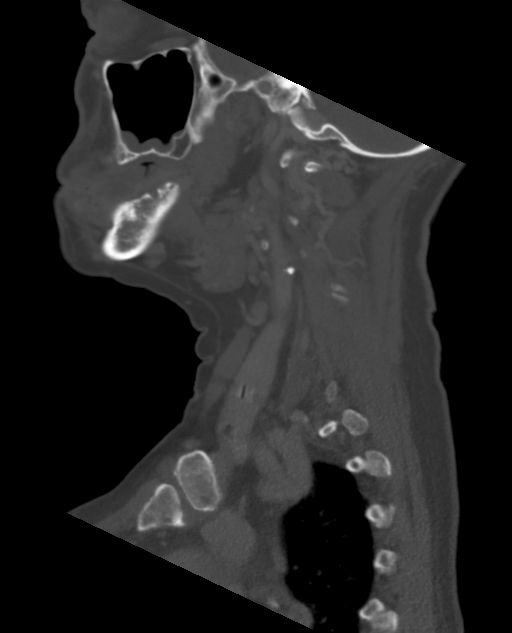
[im 29/69  bone]
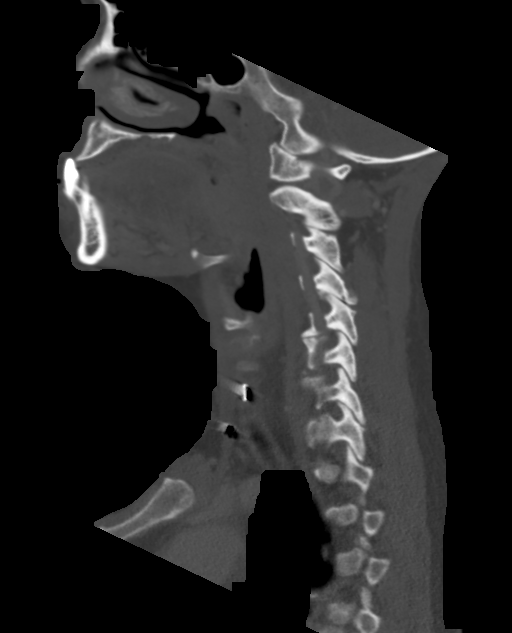
[im 35/69  soft-tissue]
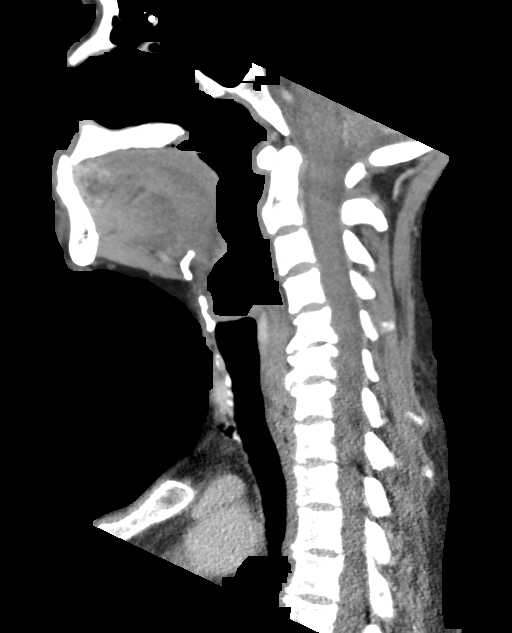
[im 35/69  bone]
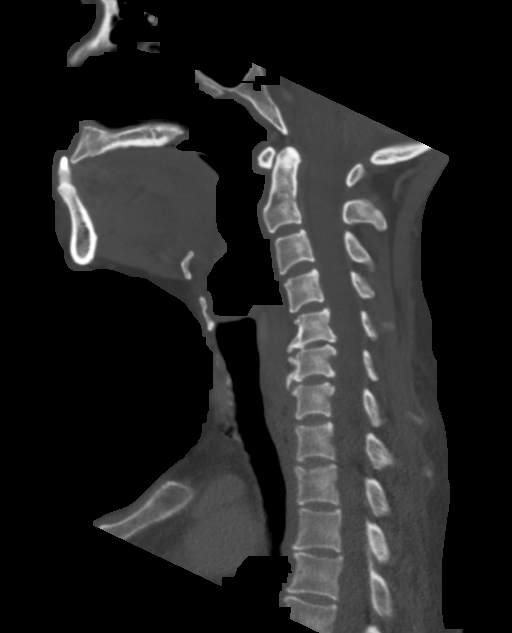
[im 40/69  bone]
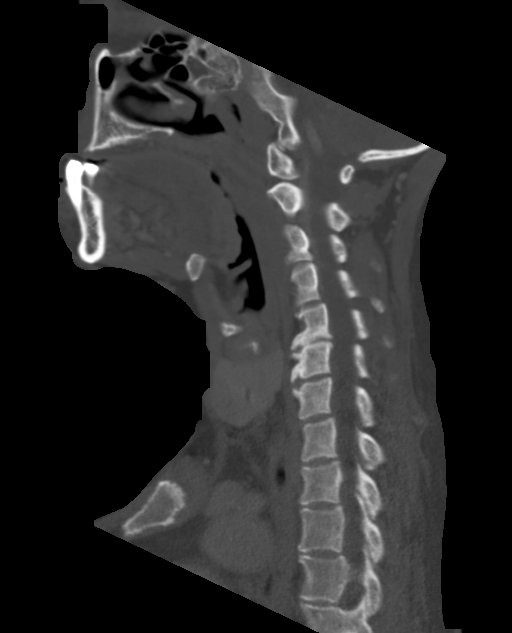
[im 46/69  bone]
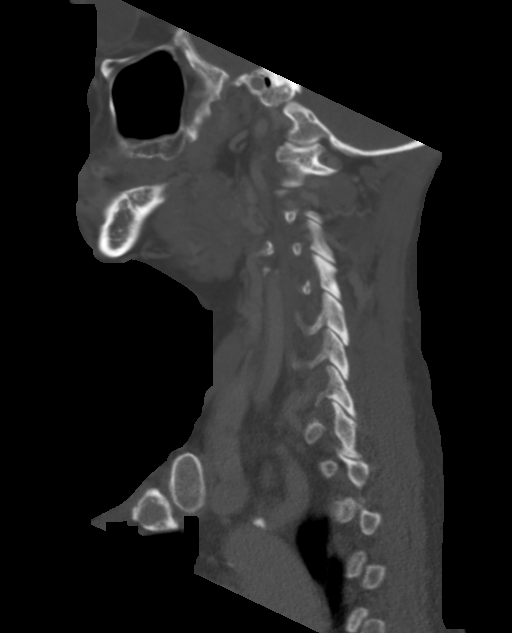

[Series 6: cor neck · coronal · 0.27mm/px · 3 of 101 slices shown]
[im 33/101  bone]
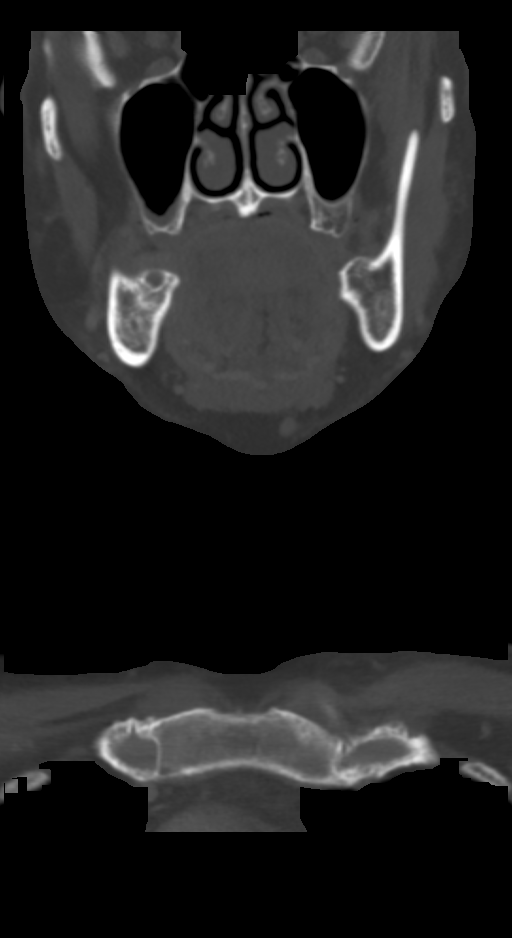
[im 45/101  bone]
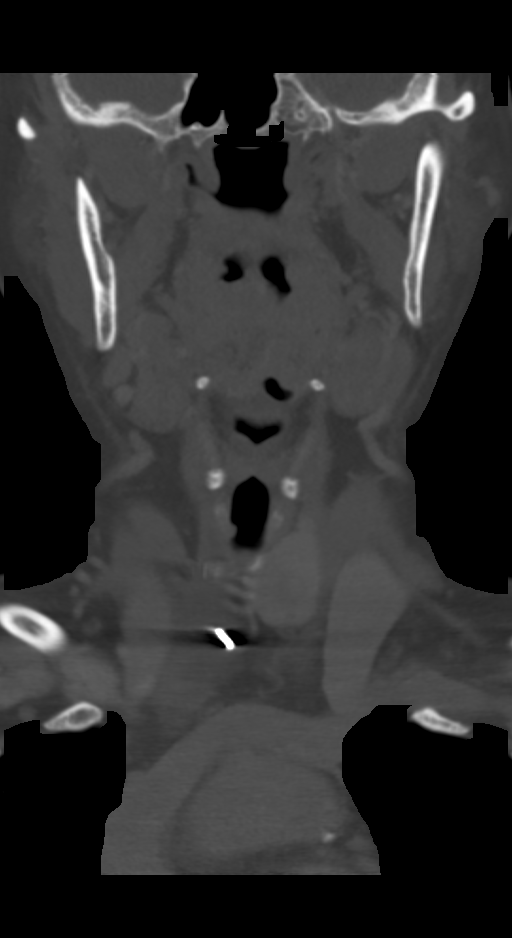
[im 56/101  bone]
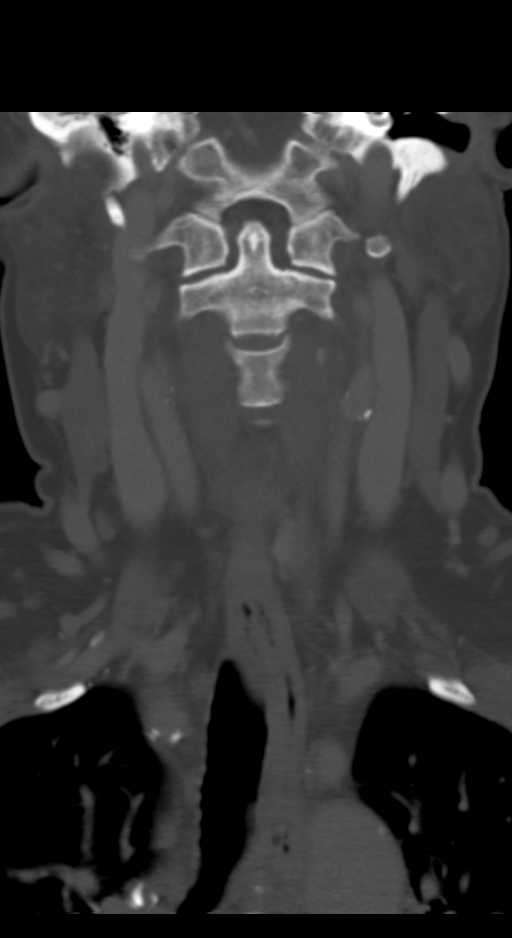

[Series 7: orthogonal ax · axial · 0.27mm/px · z∈[-249,-119]mm · 3 of 126 slices shown, 4 images]
[im 36/126  soft-tissue]
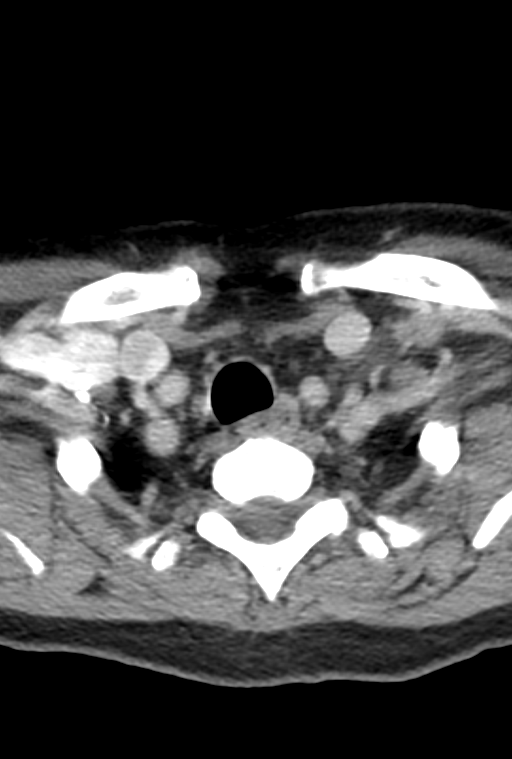
[im 36/126  bone]
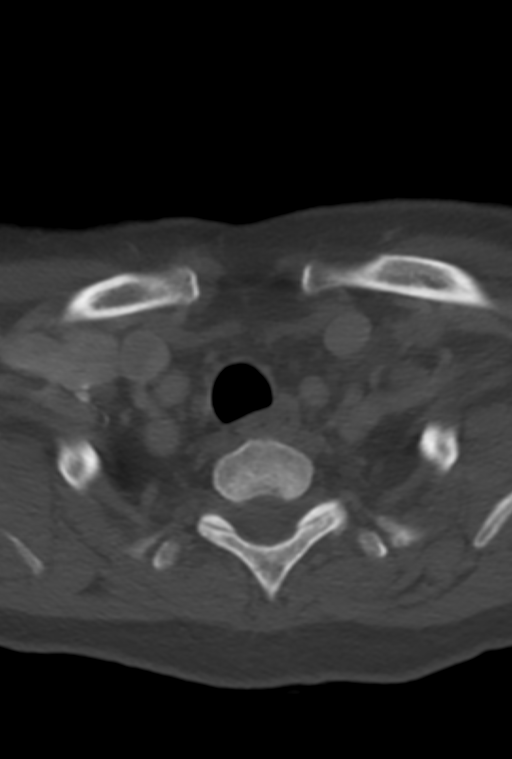
[im 72/126  bone]
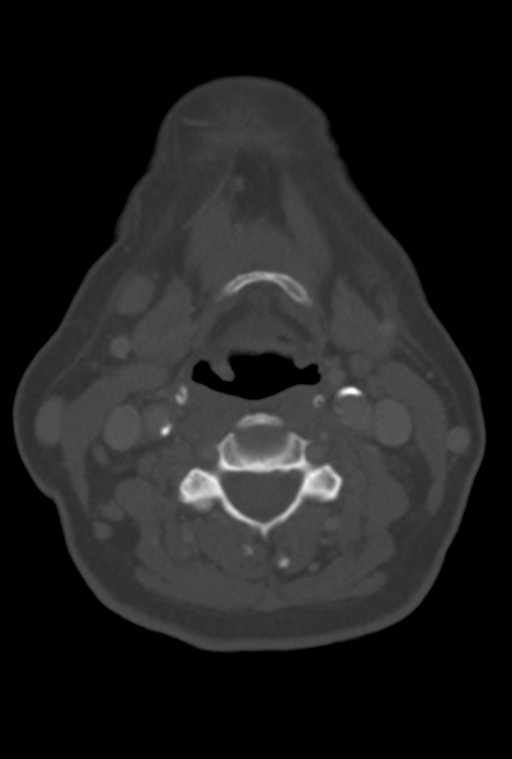
[im 108/126  bone]
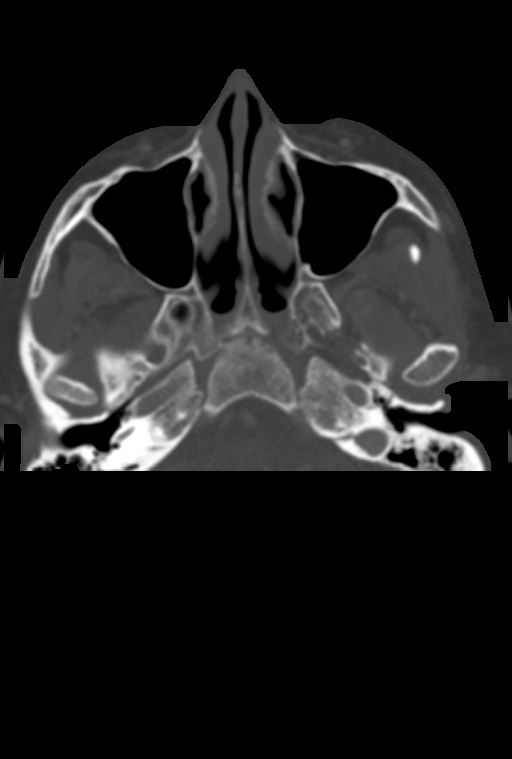

[11 of 33 positions shown; findings below may reference images not displayed]

FINDINGS: Pharynx and larynx: Normal. No mass or swelling.

Salivary glands: No inflammation, mass, or stone.

Previous large mass in the left submandibular gland has resolved
presumably infection with abscess.

Thyroid: Right thyroidectomy.  Left thyroid normal.

Lymph nodes: 10 mm submandibular lymph nodes bilaterally. 12 mm
level 2 lymph nodes bilaterally. No pathologic adenopathy based on
size criteria.

Vascular: Arterial and venous vessels enhance normally.
Atherosclerotic calcification in the carotid bifurcation
bilaterally.

Limited intracranial: Negative

Visualized orbits: Negative

Mastoids and visualized paranasal sinuses: Negative

Skeleton: Disc degeneration and spurring C5-6 and C6-7. No acute
skeletal abnormality

Recent removal of right lower molar. No residual tooth or abscess.
Edentulous maxilla. No dental abscess.

Upper chest: Lung apices clear bilaterally

Other: None
IMPRESSION: 1. Recent removal right lower third molar. No residual tooth,
abscess or foreign body identified.
2. Normal pharynx without foreign body
3. Right thyroidectomy.

## 2021-04-11 IMAGING — CT CT ABD-PELV W/ CM
2 of 14 series · 12 of 46 positions shown, 17 images · IV contrast (APPLIED)
Comparison: CT abdomen and pelvis 09/22/2018, CT chest 05/29/2011

CLINICAL DATA: Shortness of breath, abdominal pain, nausea,
body aches, dry cough

EXAM:
CT ANGIOGRAPHY CHEST
CT ABDOMEN AND PELVIS WITH CONTRAST
TECHNIQUE: Multidetector CT imaging of the chest was performed using the
standard protocol during bolus administration of intravenous
contrast. Multiplanar CT image reconstructions and MIPs were
obtained to evaluate the vascular anatomy. Multidetector CT imaging
of the abdomen and pelvis was performed using the standard protocol
during bolus administration of intravenous contrast.
CONTRAST:  75mL OMNIPAQUE IOHEXOL 350 MG/ML SOLN IV. No oral
contrast.

[Series 7: coronal mpr · coronal · 0.53mm/px · 1 of 106 slices shown, 2 images]
[im 53/106  soft-tissue]
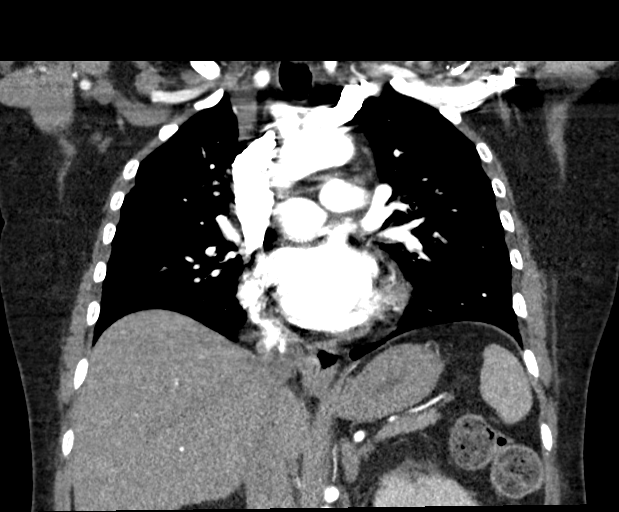
[im 53/106  bone]
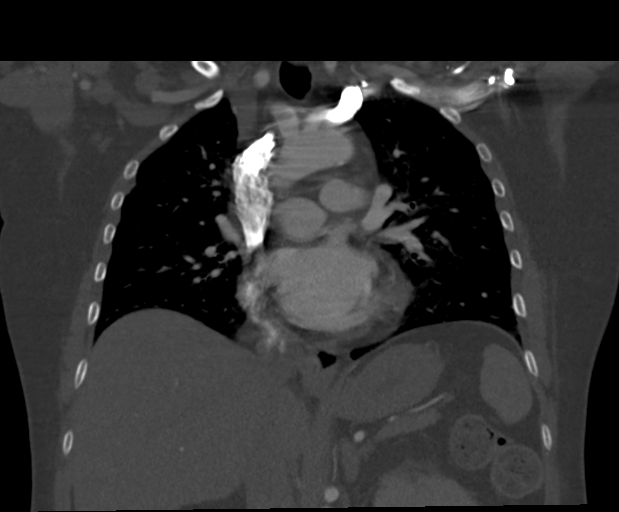

[Series 13: thins · axial · 0.68mm/px · z∈[-799,-406]mm · 11 of 926 slices shown, 15 images]
[im 93/926  soft-tissue]
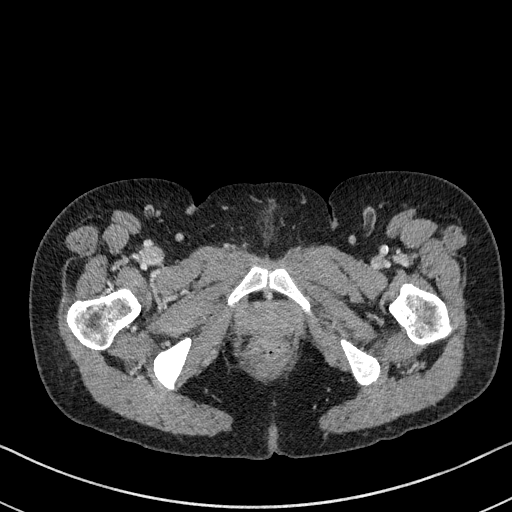
[im 93/926  bone]
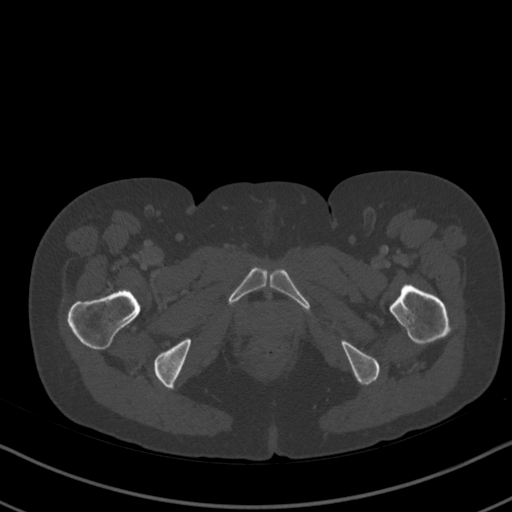
[im 186/926  soft-tissue]
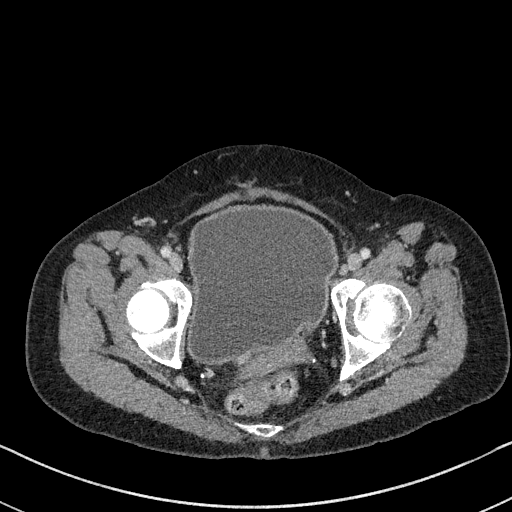
[im 278/926  soft-tissue]
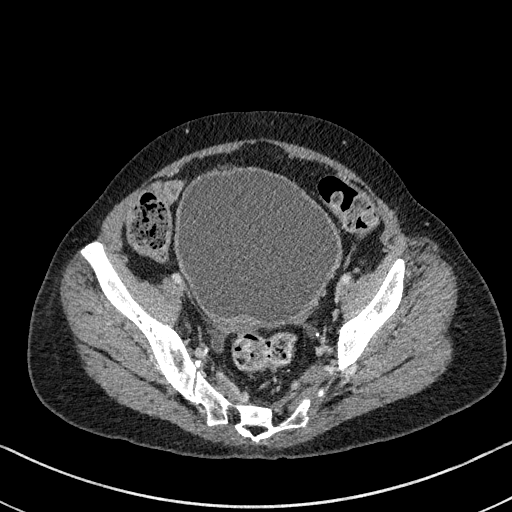
[im 371/926  soft-tissue]
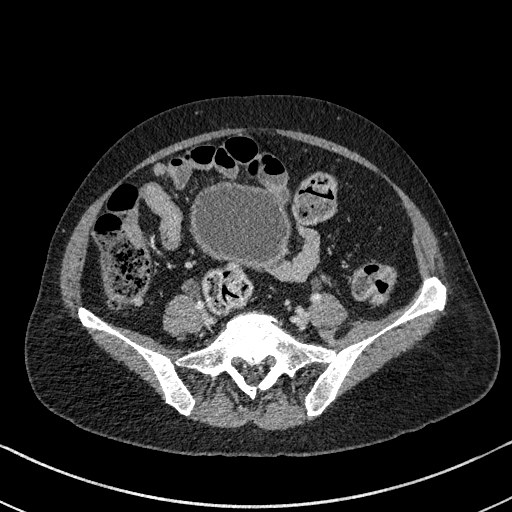
[im 463/926  soft-tissue]
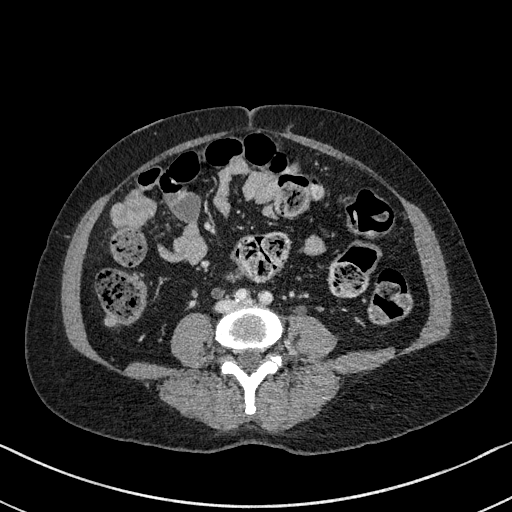
[im 556/926  soft-tissue]
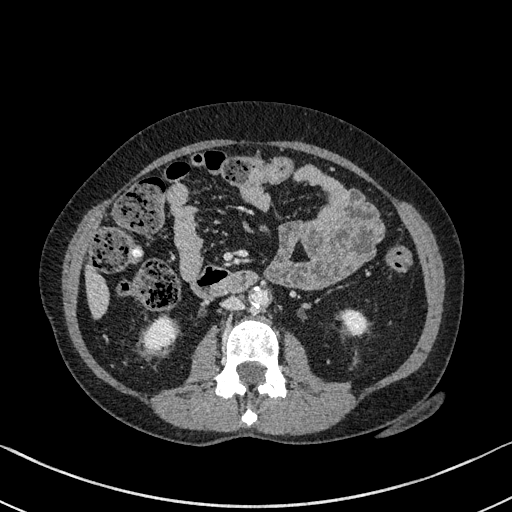
[im 648/926  soft-tissue]
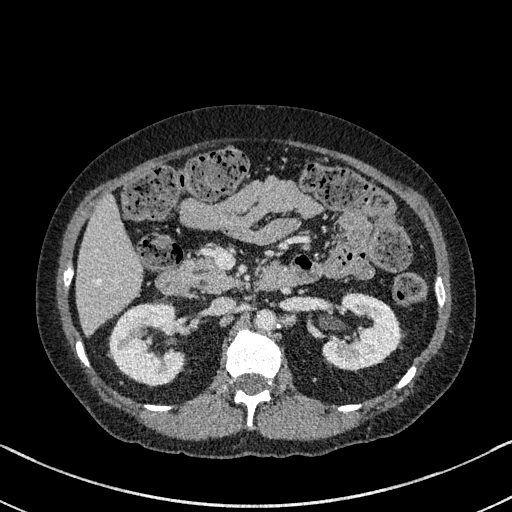
[im 741/926  soft-tissue]
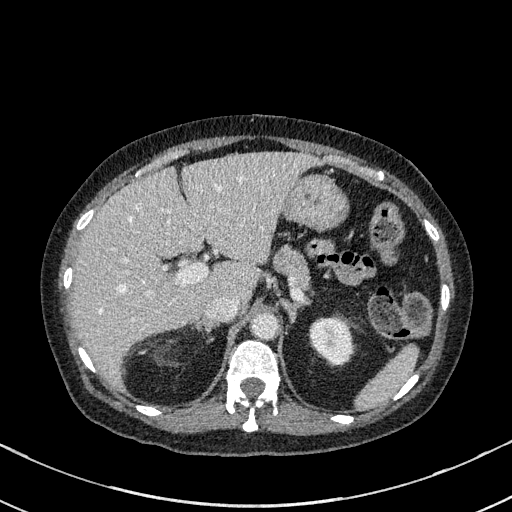
[im 741/926  lung]
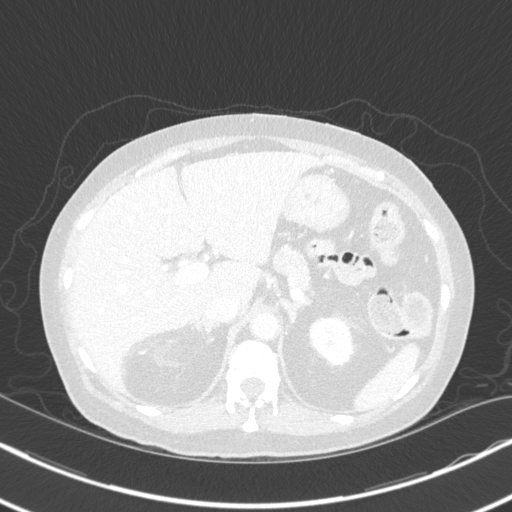
[im 787/926  lung]
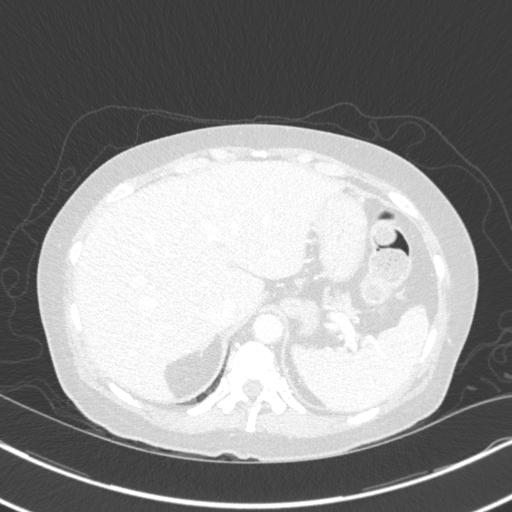
[im 833/926  soft-tissue]
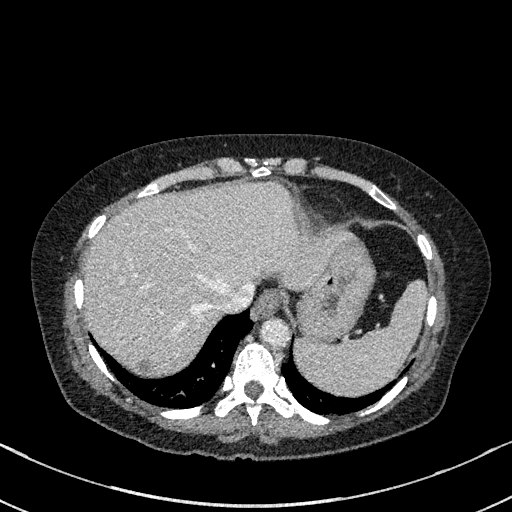
[im 833/926  lung]
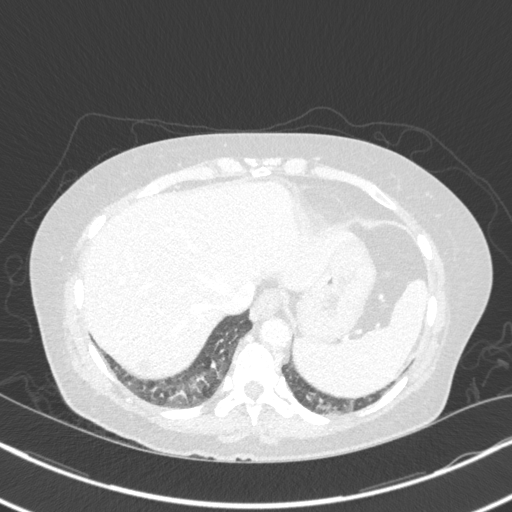
[im 833/926  bone]
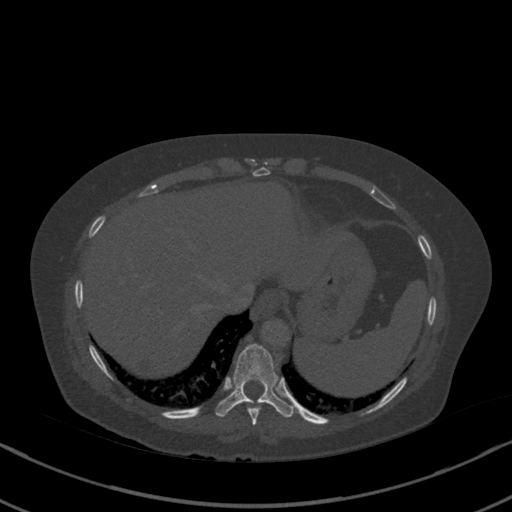
[im 879/926  lung]
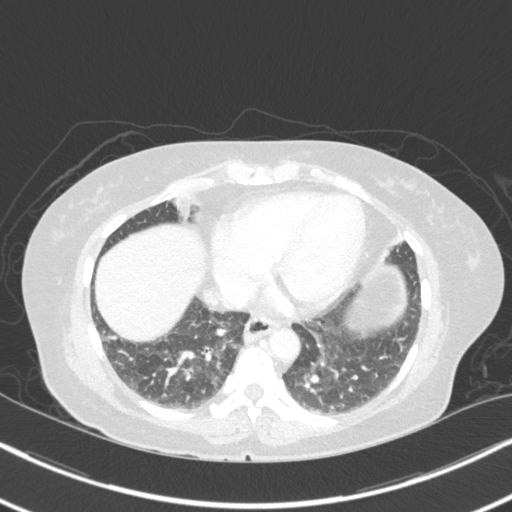

[12 of 46 positions shown; findings below may reference images not displayed]

FINDINGS: CTA CHEST FINDINGS

Cardiovascular: Scattered atherosclerotic calcifications aorta,
proximal great vessels, and coronary arteries. Aorta normal caliber.
No aneurysm or dissection. Pulmonary arteries adequately opacified
and patent. No evidence of pulmonary embolism. Heart normal size. No
pericardial effusion.

Mediastinum/Nodes: Absence of RIGHT thyroid lobe. Beam hardening
artifacts LEFT thyroid lobe. No thoracic adenopathy. Esophagus
unremarkable.

Lungs/Pleura: Peribronchial thickening. Minimal scattered
atelectasis. No definite acute infiltrate, pleural effusion or
pneumothorax.

Musculoskeletal: No acute osseous findings.

Review of the MIP images confirms the above findings.

CT ABDOMEN and PELVIS FINDINGS

Hepatobiliary: Nonspecific low-attenuation lesion posterior RIGHT
lobe liver superiorly 9 x 8 mm image 11 unchanged. Gallbladder and
liver otherwise normal appearance

Pancreas: Normal appearance

Spleen: Normal appearance

Adrenals/Urinary Tract: Adrenal glands, kidneys, and ureters normal
appearance. Bladder well distended with minimally thickened wall,
nonspecific. No definite bladder mass.

Stomach/Bowel: Appendix reportedly surgically absent though a short
normal appearing appendiceal base is identified 1.7 cm length. Stool
throughout colon. Scattered colonic diverticula without evidence of
diverticulitis. Stomach and bowel loops otherwise normal appearance

Vascular/Lymphatic: Atherosclerotic calcifications without aneurysm.
No adenopathy.

Reproductive: Uterus surgically absent with normal sized ovaries

Other: No free air or free fluid.  No acute inflammatory process.

Musculoskeletal: Osseous structures unremarkable. Small lipoma of
the distal RIGHT iliopsoas.

Review of the MIP images confirms the above findings.
IMPRESSION: Scattered atherosclerotic calcifications including coronary arteries
without aortic aneurysm or dissection.

No evidence of pulmonary embolism.

Scattered colonic diverticulosis without evidence of diverticulitis.

Scattered atelectasis in both lungs.

Stable 9 mm nonspecific liver lesion.

Aortic Atherosclerosis ([id number]).

## 2021-04-11 IMAGING — CT CT HEAD W/O CM
3 series · 16 of 47 positions shown, 19 images · non-contrast
Comparison: 01/03/2017

CLINICAL DATA: Headache, fevers

EXAM:
CT HEAD WITHOUT CONTRAST
TECHNIQUE: Contiguous axial images were obtained from the base of the skull
through the vertex without intravenous contrast.

[Series 2: head wo · axial · 0.38mm/px · z∈[-153,-28]mm · 10 of 31 slices shown, 13 images]
[im 3/31  brain]
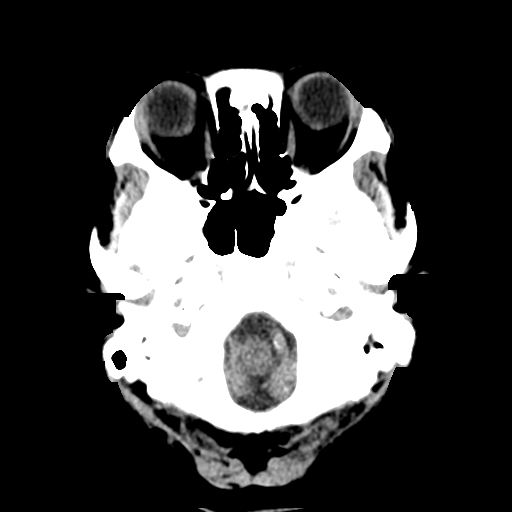
[im 3/31  bone]
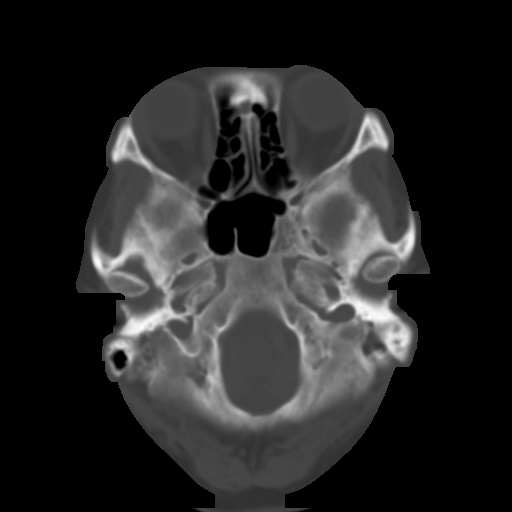
[im 6/31  brain]
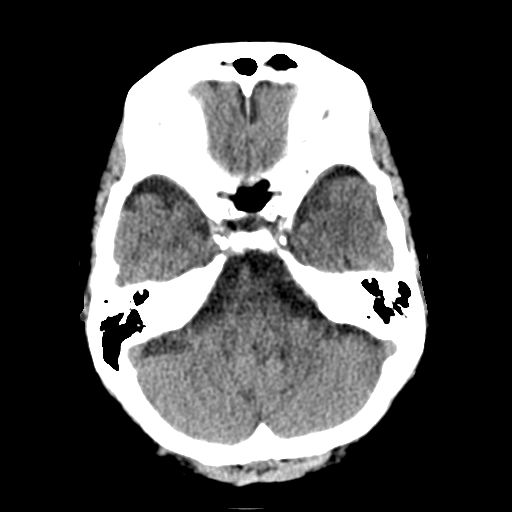
[im 9/31  brain]
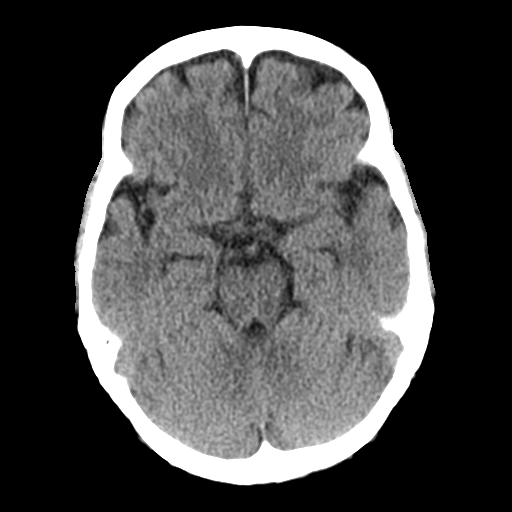
[im 11/31  brain]
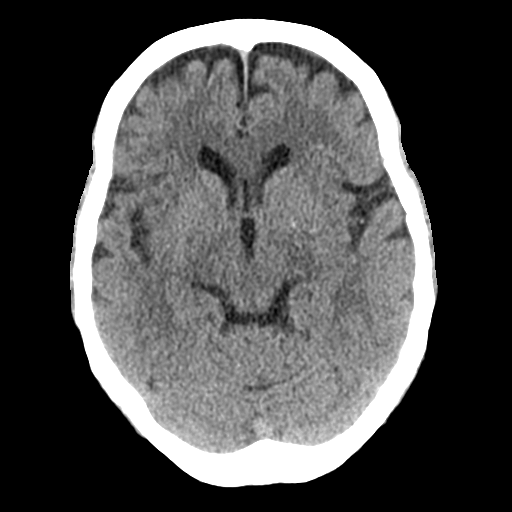
[im 14/31  brain]
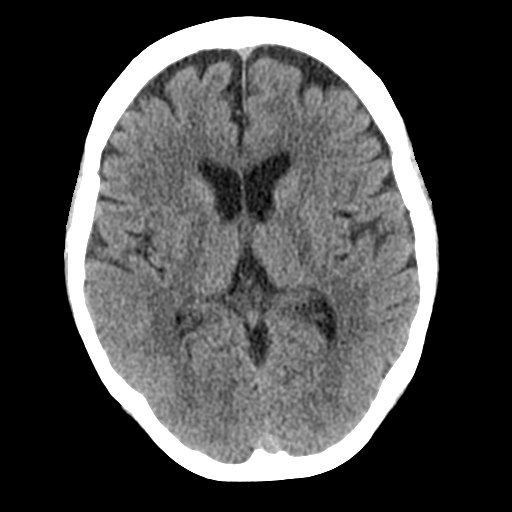
[im 14/31  bone]
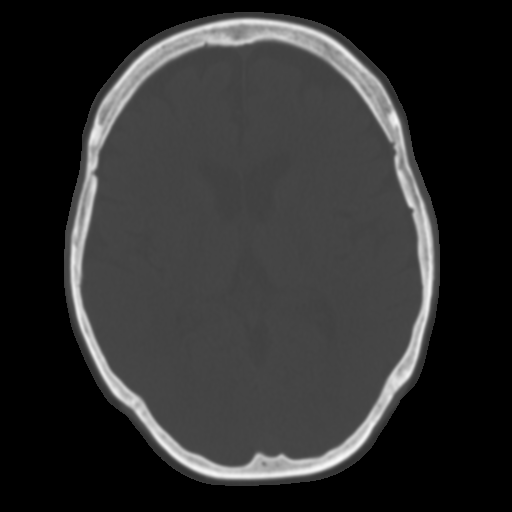
[im 17/31  brain]
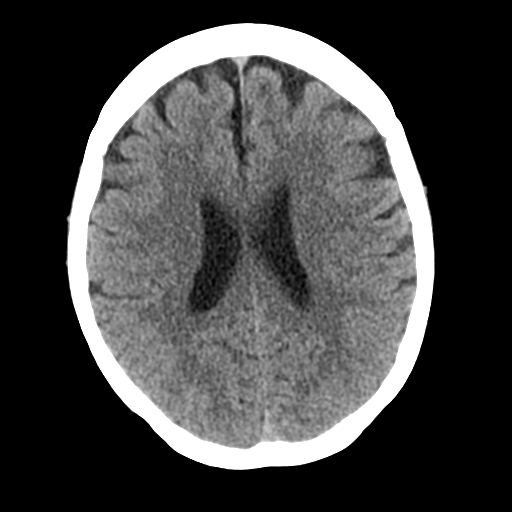
[im 20/31  brain]
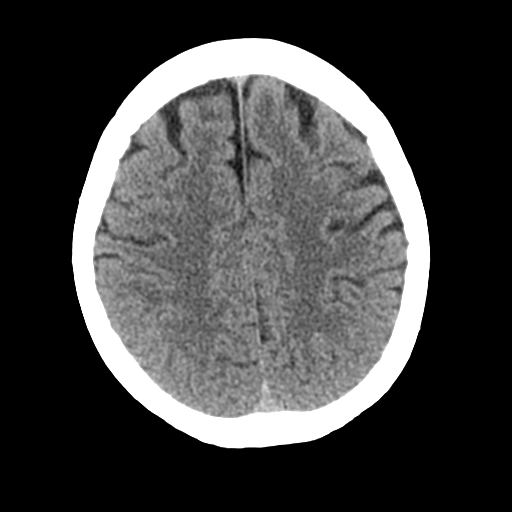
[im 23/31  brain]
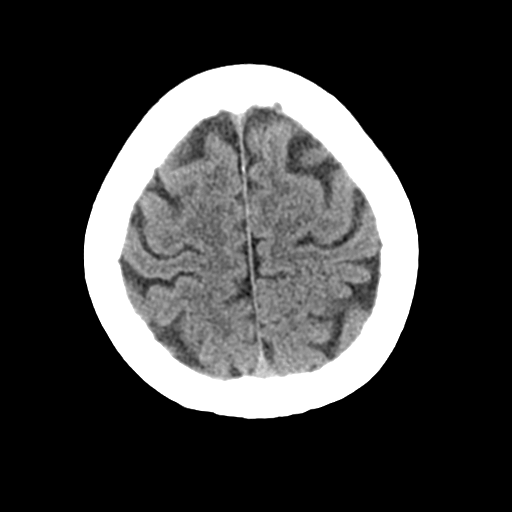
[im 25/31  brain]
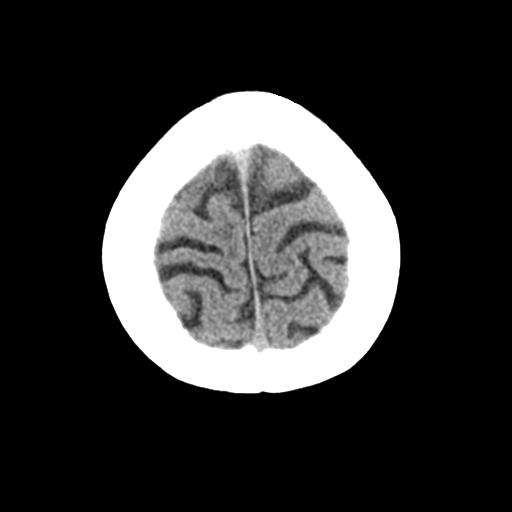
[im 25/31  bone]
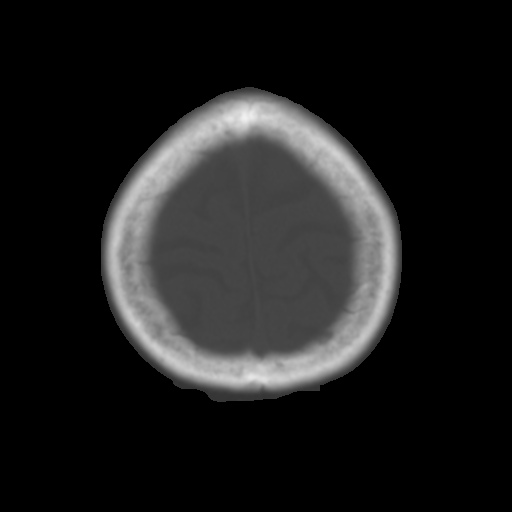
[im 28/31  brain]
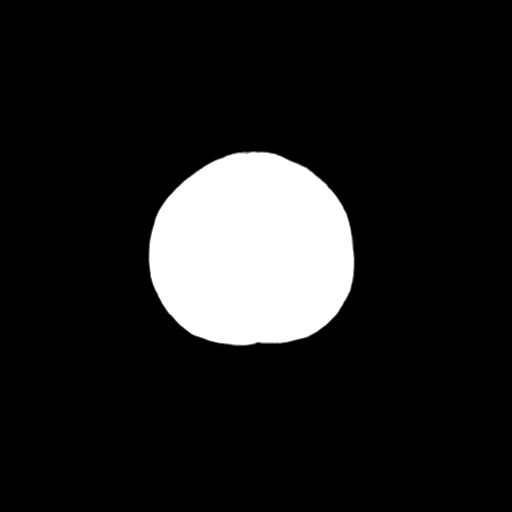

[Series 4: sagittal soft tissue · sagittal · 0.32mm/px · 3 of 52 slices shown]
[im 18/52  brain]
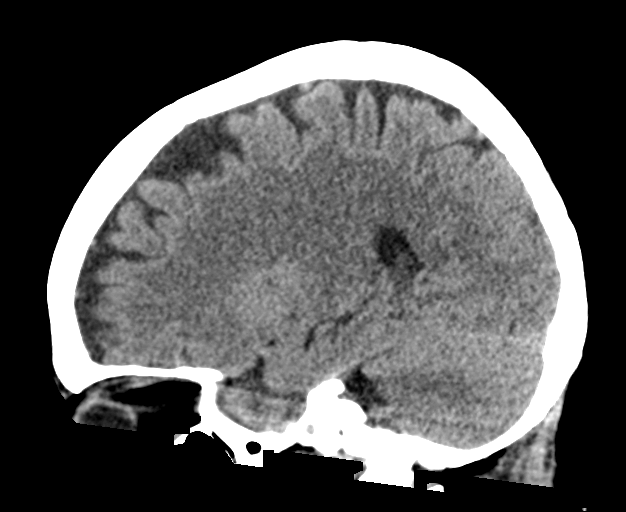
[im 26/52  brain]
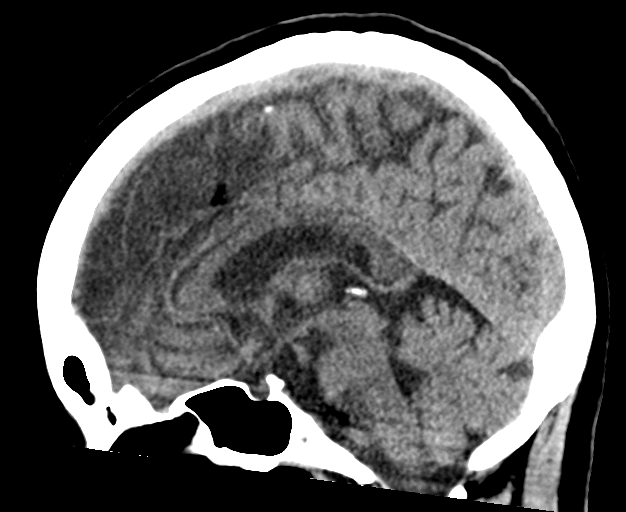
[im 35/52  brain]
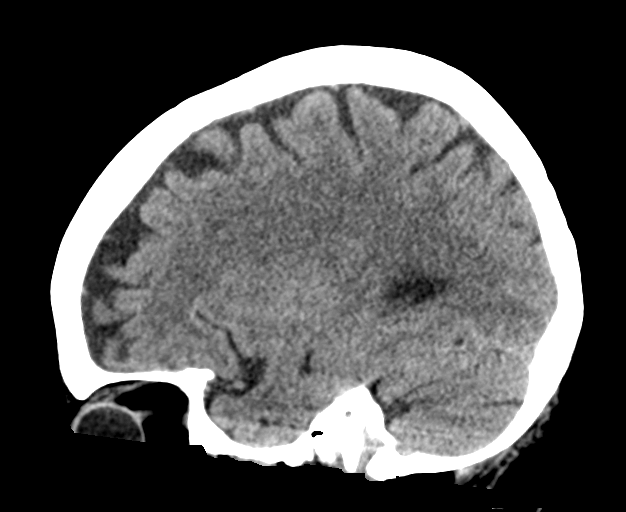

[Series 5: coronal soft tissue · coronal · 0.31mm/px · 3 of 62 slices shown]
[im 21/62  brain]
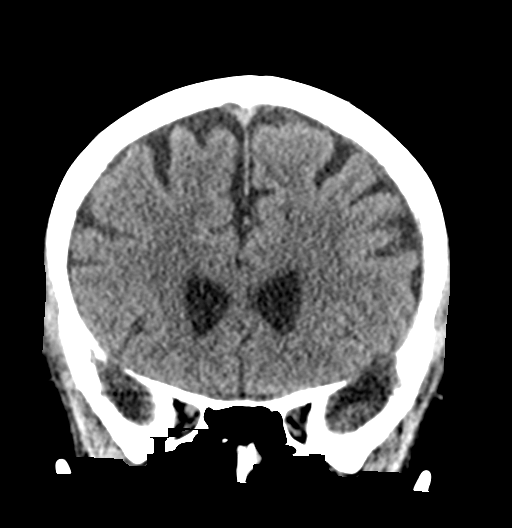
[im 28/62  brain]
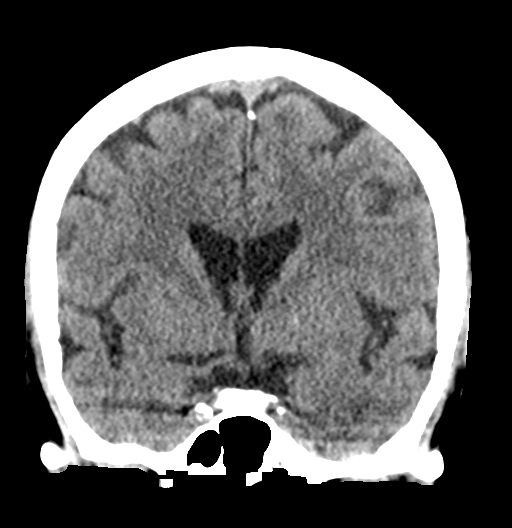
[im 34/62  brain]
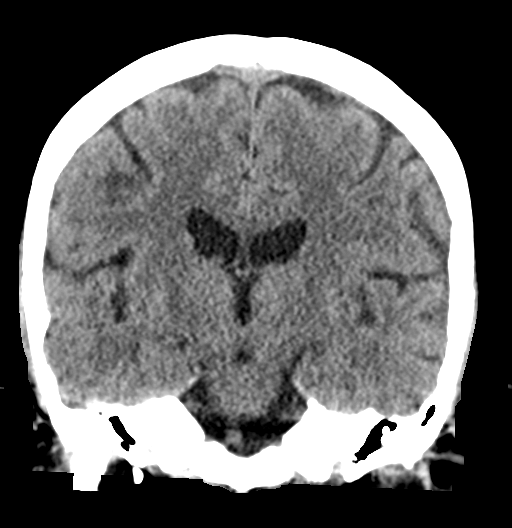

[16 of 47 positions shown; findings below may reference images not displayed]

FINDINGS: Brain: There is no acute intracranial hemorrhage, mass-effect, or
edema. Gray-white differentiation is preserved. There is no
extra-axial fluid collection. Ventricles and sulci are within normal
limits in size and configuration.

Vascular: There is atherosclerotic calcification at the skull base.

Skull: Calvarium is unremarkable.

Sinuses/Orbits: Patchy paranasal sinus mucosal thickening.
Visualized orbits are unremarkable.

Other: None.
IMPRESSION: No acute intracranial abnormality.

## 2021-04-25 ENCOUNTER — Other Ambulatory Visit: Payer: Self-pay | Admitting: Unknown Physician Specialty

## 2021-04-25 DIAGNOSIS — E041 Nontoxic single thyroid nodule: Secondary | ICD-10-CM

## 2021-04-25 DIAGNOSIS — K1123 Chronic sialoadenitis: Secondary | ICD-10-CM

## 2021-05-06 ENCOUNTER — Ambulatory Visit
Admission: RE | Admit: 2021-05-06 | Discharge: 2021-05-06 | Disposition: A | Discharge status: Home / Self Care | Payer: Medicaid Other | Source: Ambulatory Visit | Attending: Unknown Physician Specialty | Admitting: Unknown Physician Specialty

## 2021-05-06 ENCOUNTER — Other Ambulatory Visit: Payer: Self-pay | Admitting: Unknown Physician Specialty

## 2021-05-06 DIAGNOSIS — K1123 Chronic sialoadenitis: Secondary | ICD-10-CM

## 2021-05-06 DIAGNOSIS — E041 Nontoxic single thyroid nodule: Secondary | ICD-10-CM

## 2021-05-07 ENCOUNTER — Ambulatory Visit
Admission: RE | Admit: 2021-05-07 | Discharge: 2021-05-07 | Disposition: A | Discharge status: Home / Self Care | Payer: Medicaid Other | Source: Ambulatory Visit | Attending: Unknown Physician Specialty | Admitting: Unknown Physician Specialty

## 2021-05-07 DIAGNOSIS — E041 Nontoxic single thyroid nodule: Secondary | ICD-10-CM

## 2021-05-07 DIAGNOSIS — K1123 Chronic sialoadenitis: Secondary | ICD-10-CM

## 2021-05-18 IMAGING — DX DG ABD PORTABLE 1V
1 series · 1 of 1 positions shown · non-contrast
Comparison: January 30, 2019.

CLINICAL DATA: Constipation, abdominal pain and distention.

EXAM:
PORTABLE ABDOMEN - 1 VIEW

[abdomen supine]
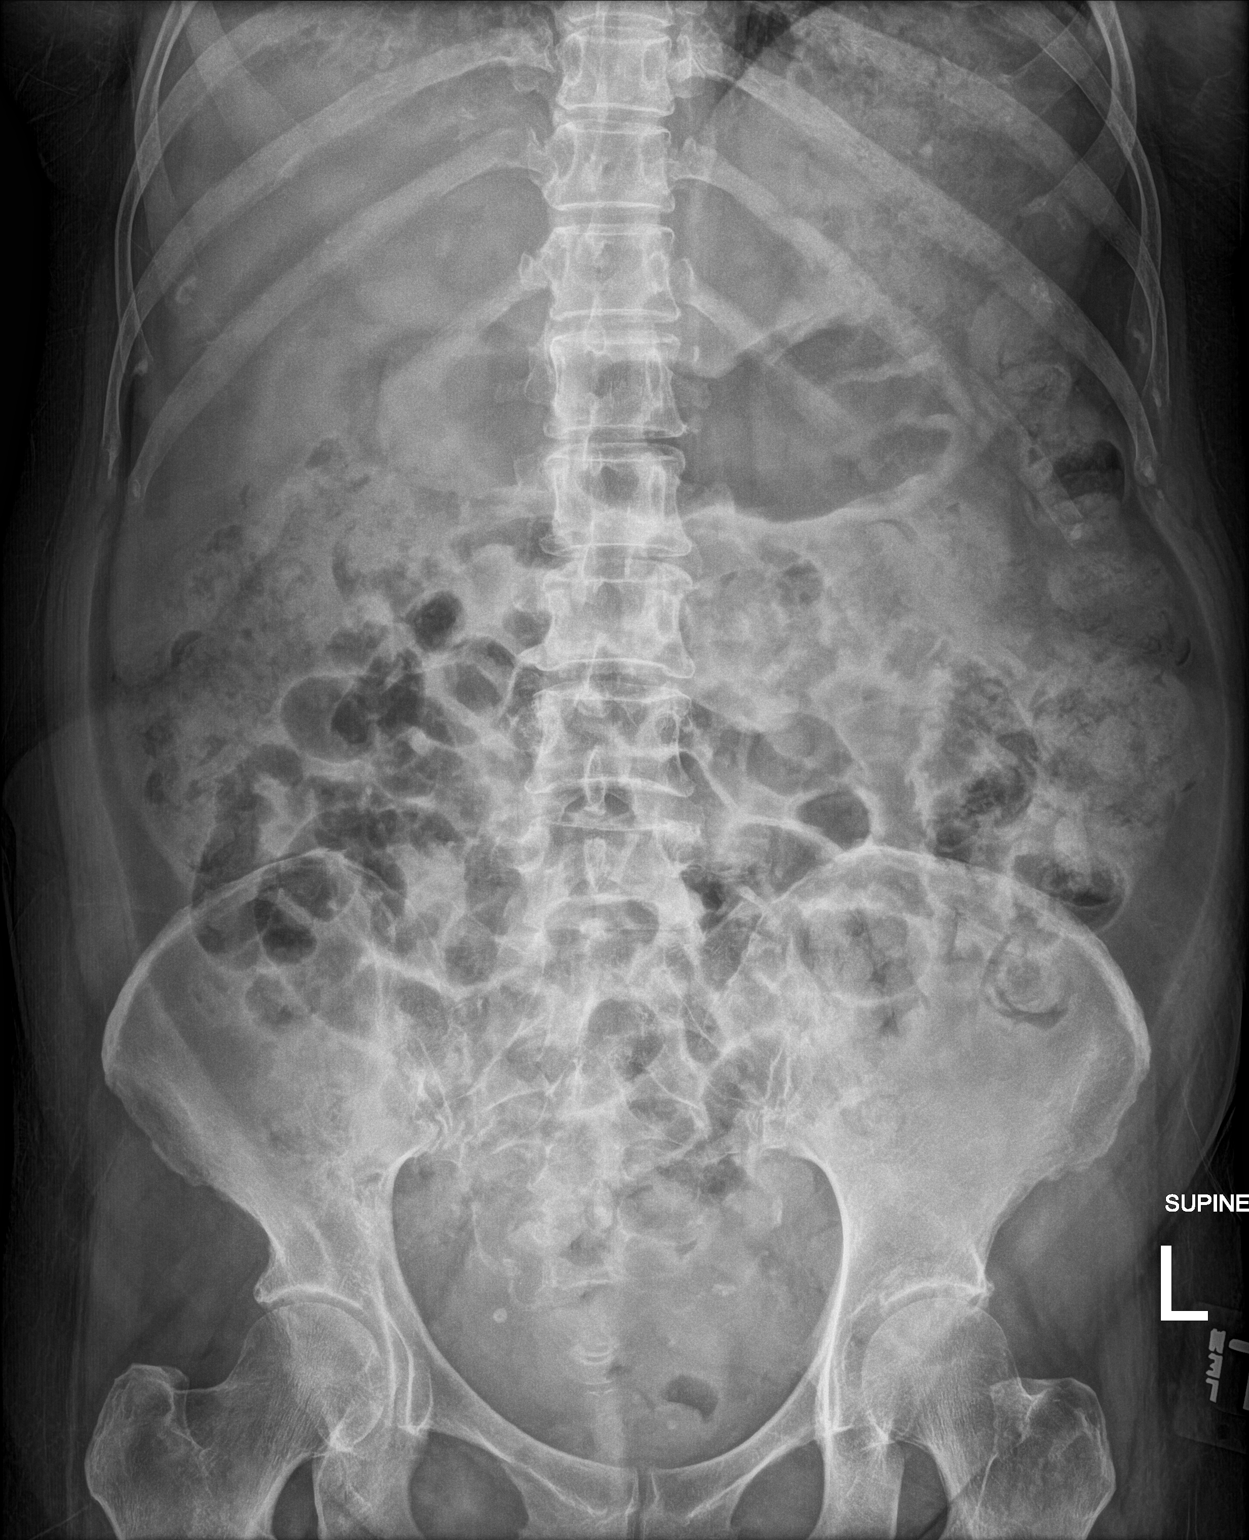

[1 of 1 positions shown; findings below may reference images not displayed]

FINDINGS: No abnormal bowel dilatation is noted. Large amount of stool seen
throughout the colon. No radio-opaque calculi or other significant
radiographic abnormality are seen.
IMPRESSION: Large stool burden is noted. No abnormal bowel dilatation is noted.

## 2021-05-22 ENCOUNTER — Other Ambulatory Visit: Payer: Self-pay | Admitting: Unknown Physician Specialty

## 2021-05-22 DIAGNOSIS — K1123 Chronic sialoadenitis: Secondary | ICD-10-CM

## 2021-05-23 ENCOUNTER — Other Ambulatory Visit: Payer: Self-pay | Admitting: Unknown Physician Specialty

## 2021-05-23 ENCOUNTER — Ambulatory Visit: Payer: Self-pay | Admitting: *Deleted

## 2021-05-23 ENCOUNTER — Emergency Department
Admission: EM | Admit: 2021-05-23 | Discharge: 2021-05-23 | Disposition: A | Discharge status: Home / Self Care | Payer: Medicaid Other | Attending: Emergency Medicine | Admitting: Emergency Medicine

## 2021-05-23 ENCOUNTER — Other Ambulatory Visit: Payer: Self-pay

## 2021-05-23 ENCOUNTER — Encounter: Payer: Self-pay | Admitting: Emergency Medicine

## 2021-05-23 DIAGNOSIS — E119 Type 2 diabetes mellitus without complications: Secondary | ICD-10-CM | POA: Diagnosis not present

## 2021-05-23 DIAGNOSIS — E1110 Type 2 diabetes mellitus with ketoacidosis without coma: Secondary | ICD-10-CM | POA: Insufficient documentation

## 2021-05-23 DIAGNOSIS — I10 Essential (primary) hypertension: Secondary | ICD-10-CM | POA: Diagnosis not present

## 2021-05-23 DIAGNOSIS — G894 Chronic pain syndrome: Secondary | ICD-10-CM | POA: Diagnosis not present

## 2021-05-23 DIAGNOSIS — R1110 Vomiting, unspecified: Secondary | ICD-10-CM | POA: Diagnosis not present

## 2021-05-23 DIAGNOSIS — R1012 Left upper quadrant pain: Secondary | ICD-10-CM | POA: Insufficient documentation

## 2021-05-23 DIAGNOSIS — E041 Nontoxic single thyroid nodule: Secondary | ICD-10-CM

## 2021-05-23 DIAGNOSIS — R519 Headache, unspecified: Secondary | ICD-10-CM | POA: Diagnosis not present

## 2021-05-23 DIAGNOSIS — Z794 Long term (current) use of insulin: Secondary | ICD-10-CM | POA: Insufficient documentation

## 2021-05-23 LAB — COMPREHENSIVE METABOLIC PANEL
ALT: 47 U/L — ABNORMAL HIGH (ref 0–44)
AST: 52 U/L — ABNORMAL HIGH (ref 15–41)
Albumin: 3.6 g/dL (ref 3.5–5.0)
Alkaline Phosphatase: 249 U/L — ABNORMAL HIGH (ref 38–126)
Anion gap: 10 (ref 5–15)
BUN: 33 mg/dL — ABNORMAL HIGH (ref 8–23)
CO2: 27 mmol/L (ref 22–32)
Calcium: 9.2 mg/dL (ref 8.9–10.3)
Chloride: 94 mmol/L — ABNORMAL LOW (ref 98–111)
Creatinine, Ser: 1.8 mg/dL — ABNORMAL HIGH (ref 0.44–1.00)
GFR, Estimated: 31 mL/min — ABNORMAL LOW (ref >60)
Glucose, Bld: 452 mg/dL — ABNORMAL HIGH (ref 70–99)
Potassium: 4.3 mmol/L (ref 3.5–5.1)
Sodium: 131 mmol/L — ABNORMAL LOW (ref 135–145)
Total Bilirubin: 0.3 mg/dL (ref 0.3–1.2)
Total Protein: 7.8 g/dL (ref 6.5–8.1)

## 2021-05-23 LAB — CBC
HCT: 38.3 % (ref 36.0–46.0)
Hemoglobin: 13 g/dL (ref 12.0–15.0)
MCH: 28.6 pg (ref 26.0–34.0)
MCHC: 33.9 g/dL (ref 30.0–36.0)
MCV: 84.2 fL (ref 80.0–100.0)
Platelets: 250 10*3/uL (ref 150–400)
RBC: 4.55 MIL/uL (ref 3.87–5.11)
RDW: 11.7 % (ref 11.5–15.5)
WBC: 10.8 10*3/uL — ABNORMAL HIGH (ref 4.0–10.5)
nRBC: 0 % (ref 0.0–0.2)

## 2021-05-23 LAB — LIPASE, BLOOD: Lipase: 99 U/L — ABNORMAL HIGH (ref 11–51)

## 2021-05-23 MED ORDER — SODIUM CHLORIDE 0.9 % IV BOLUS
500.0000 mL | Freq: Once | INTRAVENOUS | Status: AC
  Administered 2021-05-23: 500 mL via INTRAVENOUS

## 2021-05-23 MED ORDER — HYDROCODONE-ACETAMINOPHEN 5-325 MG PO TABS
1.0000 | ORAL_TABLET | Freq: Once | ORAL | Status: AC
  Administered 2021-05-23: 1 via ORAL
  Filled 2021-05-23: qty 1

## 2021-05-23 MED ORDER — ONDANSETRON HCL 4 MG/2ML IJ SOLN
4.0000 mg | Freq: Once | INTRAMUSCULAR | Status: AC
  Administered 2021-05-23: 4 mg via INTRAVENOUS
  Filled 2021-05-23: qty 2

## 2021-05-23 MED ORDER — ALUM & MAG HYDROXIDE-SIMETH 200-200-20 MG/5ML PO SUSP
30.0000 mL | Freq: Once | ORAL | Status: AC
  Administered 2021-05-23: 30 mL via ORAL
  Filled 2021-05-23: qty 30

## 2021-05-23 MED ORDER — FAMOTIDINE 20 MG PO TABS
40.0000 mg | ORAL_TABLET | Freq: Once | ORAL | Status: AC
  Administered 2021-05-23: 40 mg via ORAL
  Filled 2021-05-23: qty 2

## 2021-05-23 NOTE — ED Triage Notes (Signed)
Pt to ED from home c/o  LUQ, n/v when eating or drinking, headache that started around 0230 this morning.  States hx of pancreatitis, non compliant with medications.  Denies SOB or urinary changes.  Patient states feels bloated in her abd.

## 2021-05-23 NOTE — ED Provider Notes (Signed)
[facility] Provider Note    Event Date/Time   First MD Initiated Contact with Patient 05/23/21 [phone]     (approximate)   History   Vomiting, Abdominal Pain, and Headache   HPI  Karen [name] is a 66 y.o. female with a history of diabetes and DKA, recurrent pancreatitis, hypertension who comes the ED complaining of left upper quadrant abdominal pain which feels like her chronic pancreatitis.  She states that it feels worse, in the setting of not having access to her chronic opioid pain medication for the past 2 days because she left it behind accidentally on a recent beach trip.  She has her other medications including Creon.  Denies chest pain or shortness of breath.  Reports some nonbloody emesis and pain with eating.  No diarrhea.  No black or bloody stool.  No dizziness or fever.     Physical Exam   Triage Vital Signs: ED Triage Vitals [05/23/21 0615]  Enc Vitals Group     BP (!) 220/90     Pulse Rate 85     Resp 18     Temp 98.5 F (36.9 C)     Temp Source Oral     SpO2 98 %     Weight 120 lb (54.4 kg)     Height '5\' 1"'$  (1.549 m)     Head Circumference      Peak Flow      Pain Score 8     Pain Loc      Pain Edu?      Excl. in [place]?     Most recent vital signs: Vitals:   05/23/21 0638 05/23/21 0800  BP: (!) 192/95 (!) 163/76  Pulse: 81 80  Resp:    Temp:    SpO2: 100% 100%     General: Awake, no distress.  CV:  Good peripheral perfusion.  Regular rate and rhythm Resp:  Normal effort.  Clear to auscultation bilaterally Abd:  No distention.  Soft nontender Other:  Moist oral mucosa.  No lower extremity edema or calf tenderness   ED Results / Procedures / Treatments   Labs (all labs ordered are listed, but only abnormal results are displayed) Labs Reviewed  LIPASE, BLOOD - Abnormal; Notable for the following components:      Result Value   Lipase 99 (*)    All other components within normal limits  COMPREHENSIVE METABOLIC PANEL  - Abnormal; Notable for the following components:   Sodium 131 (*)    Chloride 94 (*)    Glucose, Bld 452 (*)    BUN 33 (*)    Creatinine, Ser 1.80 (*)    AST 52 (*)    ALT 47 (*)    Alkaline Phosphatase 249 (*)    GFR, Estimated 31 (*)    All other components within normal limits  CBC - Abnormal; Notable for the following components:   WBC 10.8 (*)    All other components within normal limits  URINALYSIS, ROUTINE W REFLEX MICROSCOPIC     EKG  Interpreted by me Sinus rhythm rate of 83.  Normal axis and intervals.  Normal QRS ST segments and T waves.   RADIOLOGY CT abdomen pelvis from June 10, 2020 independently viewed and interpreted by me, shows gallbladder is present, no mass or other structural abnormality of the pancreas.  Radiology report reviewed from that time.    PROCEDURES:  Critical Care performed: No  .1-3 Lead EKG Interpretation Performed by:  Carrie Mew, MD Authorized by: Carrie Mew, MD     Interpretation: normal     ECG rate:  80   ECG rate assessment: normal     Rhythm: sinus rhythm     Ectopy: none     Conduction: normal     MEDICATIONS ORDERED IN ED: Medications  alum & mag hydroxide-simeth (MAALOX/MYLANTA) 200-200-20 MG/5ML suspension 30 mL (30 mLs Oral Given 05/23/21 0722)  sodium chloride 0.9 % bolus 500 mL (500 mLs Intravenous New Bag/Given 05/23/21 0722)  ondansetron (ZOFRAN) injection 4 mg (4 mg Intravenous Given 05/23/21 0722)  HYDROcodone-acetaminophen (NORCO/VICODIN) 5-325 MG per tablet 1 tablet (1 tablet Oral Given 05/23/21 0722)  famotidine (PEPCID) tablet 40 mg (40 mg Oral Given 05/23/21 1683)     IMPRESSION / MDM / ASSESSMENT AND PLAN / ED COURSE  I reviewed the triage vital signs and the nursing notes.                              The patient's symptoms today represent a potential exacerbation of chronic illness.  Differential diagnosis includes, but is not limited to, GERD, chronic pain syndrome, chronic  pancreatitis, opioid withdrawal  **The patient is on the cardiac monitor to evaluate for evidence of arrhythmia and/or significant heart rate changes.**}  Patient presents with left upper quadrant abdominal pain which she describes as acute flareup of her chronic pancreatitis pain.  This may be related to being out of pain medication.  I reviewed the PDMP and saw that she received a 30-day supply on May 10.  Labs reviewed, all unremarkable.  Slight elevations of lipase and LFTs are consistent with chronic baseline.  Will give an acids, antiemetics, 1 oral Vicodin, and p.o. trial.   Considering the patient's symptoms, medical history, and physical examination today, I have low suspicion for cholecystitis or biliary pathology, pancreatitis, perforation or bowel obstruction, hernia, intra-abdominal abscess, AAA or dissection, volvulus or intussusception, mesenteric ischemia, or appendicitis.  ----------------------------------------- 9:09 AM on 05/23/2021 ----------------------------------------- Feeling better.  Tolerating oral intake.  Not requiring admission.  Recommended she call her doctor today regarding possible need to replace her lost opioid prescription.  I think symptoms are most likely related to opioid withdrawal if she has been out of her long-term hydrocodone for the past 2 or 3 days.      FINAL CLINICAL IMPRESSION(S) / ED DIAGNOSES   Final diagnoses:  Chronic pain syndrome  Type 2 diabetes mellitus without complication, with long-term current use of insulin ([place])     Rx / DC Orders   ED Discharge Orders     None        Note:  This document was prepared using Dragon voice recognition software and may include unintentional dictation errors.   Carrie Mew, MD 05/23/21 [phone]

## 2021-05-23 NOTE — Telephone Encounter (Signed)
  Chief Complaint: patient was seen at ED for pancreatis- they did not give her a pain medication Rx- patient did contact her PCP- but her PCP is not in office today.  Advised patient since she was discharged from ED- she will have to go back for her pain control if she can not get help from her PCP at this point. Symptoms: pain control Frequency: 2 days Pertinent Negatives: Patient denies   Disposition: '[x]'$ ED /'[]'$ Urgent Care (no appt availability in office) / '[]'$ Appointment(In office/virtual)/ '[]'$  [facility]/ '[]'$ Home Care/ '[]'$ Refused Recommended Disposition /'[]'$ [facility]/ '[]'$  Follow-up with PCP Additional Notes:

## 2021-05-23 NOTE — Telephone Encounter (Signed)
Reason for Disposition . [1] SEVERE pain (e.g., excruciating) AND [2] present > 1 hour  Answer Assessment - Initial Assessment Questions 1. LOCATION: "Where does it hurt?"      Left side upper pain 2. RADIATION: "Does the pain shoot anywhere else?" (e.g., chest, back)     no 3. ONSET: "When did the pain begin?" (e.g., minutes, hours or days ago)      2 days 4. SUDDEN: "Gradual or sudden onset?"       5. PATTERN "Does the pain come and go, or is it constant?"    - If constant: "Is it getting better, staying the same, or worsening?"      (Note: Constant means the pain never goes away completely; most serious pain is constant and it progresses)     - If intermittent: "How long does it last?" "Do you have pain now?"     (Note: Intermittent means the pain goes away completely between bouts)     Constant- went to ED 6. SEVERITY: "How bad is the pain?"  (e.g., Scale 1-10; mild, moderate, or severe)   - MILD (1-3): doesn't interfere with normal activities, abdomen soft and not tender to touch    - MODERATE (4-7): interferes with normal activities or awakens from sleep, abdomen tender to touch    - SEVERE (8-10): excruciating pain, doubled over, unable to do any normal activities      Patient was given pain medication at ED- patient normally gets filled at PCP 7. RECURRENT SYMPTOM: "Have you ever had this type of stomach pain before?" If Yes, ask: "When was the last time?" and "What happened that time?"      Yes- chronic pancreatitis 8. CAUSE: "What do you think is causing the stomach pain?"     Flare of pancreatitis  9. RELIEVING/AGGRAVATING FACTORS: "What makes it better or worse?" (e.g., movement, antacids, bowel movement)     Pain medication  10. OTHER SYMPTOMS: "Do you have any other symptoms?" (e.g., back pain, diarrhea, fever, urination pain, vomiting)       Vomiting, headache 11. PREGNANCY: "Is there any chance you are pregnant?" "When was your last menstrual period?"       *No  Answer*  Protocols used: Abdominal Pain - [facility]

## 2021-05-26 ENCOUNTER — Other Ambulatory Visit: Payer: Self-pay | Admitting: Unknown Physician Specialty

## 2021-05-26 DIAGNOSIS — K1120 Sialoadenitis, unspecified: Secondary | ICD-10-CM

## 2021-05-26 DIAGNOSIS — K1123 Chronic sialoadenitis: Secondary | ICD-10-CM

## 2021-08-13 ENCOUNTER — Other Ambulatory Visit: Payer: Self-pay

## 2021-08-13 ENCOUNTER — Encounter: Payer: Self-pay | Admitting: Emergency Medicine

## 2021-08-13 ENCOUNTER — Emergency Department
Admission: EM | Admit: 2021-08-13 | Discharge: 2021-08-13 | Disposition: A | Discharge status: Home / Self Care | Payer: Medicaid Other | Attending: Emergency Medicine | Admitting: Emergency Medicine

## 2021-08-13 DIAGNOSIS — R195 Other fecal abnormalities: Secondary | ICD-10-CM | POA: Diagnosis present

## 2021-08-13 DIAGNOSIS — K922 Gastrointestinal hemorrhage, unspecified: Secondary | ICD-10-CM | POA: Insufficient documentation

## 2021-08-13 LAB — CBC
HCT: 39.5 % (ref 36.0–46.0)
Hemoglobin: 13.2 g/dL (ref 12.0–15.0)
MCH: 28.5 pg (ref 26.0–34.0)
MCHC: 33.4 g/dL (ref 30.0–36.0)
MCV: 85.3 fL (ref 80.0–100.0)
Platelets: 298 10*3/uL (ref 150–400)
RBC: 4.63 MIL/uL (ref 3.87–5.11)
RDW: 12.3 % (ref 11.5–15.5)
WBC: 8.2 10*3/uL (ref 4.0–10.5)
nRBC: 0 % (ref 0.0–0.2)

## 2021-08-13 LAB — COMPREHENSIVE METABOLIC PANEL
ALT: 26 U/L (ref 0–44)
AST: 31 U/L (ref 15–41)
Albumin: 3.7 g/dL (ref 3.5–5.0)
Alkaline Phosphatase: 236 U/L — ABNORMAL HIGH (ref 38–126)
Anion gap: 12 (ref 5–15)
BUN: 19 mg/dL (ref 8–23)
CO2: 23 mmol/L (ref 22–32)
Calcium: 8.9 mg/dL (ref 8.9–10.3)
Chloride: 104 mmol/L (ref 98–111)
Creatinine, Ser: 1.24 mg/dL — ABNORMAL HIGH (ref 0.44–1.00)
GFR, Estimated: 49 mL/min — ABNORMAL LOW (ref >60)
Glucose, Bld: 109 mg/dL — ABNORMAL HIGH (ref 70–99)
Potassium: 3.6 mmol/L (ref 3.5–5.1)
Sodium: 139 mmol/L (ref 135–145)
Total Bilirubin: 0.6 mg/dL (ref 0.3–1.2)
Total Protein: 7.9 g/dL (ref 6.5–8.1)

## 2021-08-13 LAB — ETHANOL: Alcohol, Ethyl (B): 135 mg/dL — ABNORMAL HIGH (ref <10)

## 2021-08-13 NOTE — ED Triage Notes (Signed)
First Nurse: Pt here with a GI bleed. Hx of DM and htn. PT is a recovering alcoholic, recent death in family that made her drink again. Pt states she is having coffee ground stool that started 3 days ago.   93-cbg 176/87 98% Ra 90

## 2021-08-13 NOTE — ED Provider Notes (Signed)
[facility] Provider Note    Event Date/Time   First MD Initiated Contact with Patient 08/13/21 1608     (approximate)   History   Rectal Bleeding   HPI  Karen [name] is a 66 y.o. female who presents to the emergency department today because of concerns for dark stool and coffee-ground emesis.  Patient has recently started drinking again because of a lot of stress.  She has noticed over the past couple of days dark and tarry stool.  Additionally she has had a couple episodes of coffee-ground emesis.  This is accompanied by some epigastric pain.  Has had similar symptoms in the past.  No significant shortness of breath.  Some weakness.  Physical Exam   Triage Vital Signs: ED Triage Vitals  Enc Vitals Group     BP 08/13/21 1338 (!) 163/81     Pulse Rate 08/13/21 1338 93     Resp 08/13/21 1338 18     Temp 08/13/21 1338 98.1 F (36.7 C)     Temp Source 08/13/21 1338 Oral     SpO2 08/13/21 1338 97 %     Weight 08/13/21 1339 120 lb (54.4 kg)     Height 08/13/21 1339 '5\' 1"'$  (1.549 m)     Head Circumference --      Peak Flow --      Pain Score 08/13/21 1339 5     Pain Loc --      Pain Edu? --      Excl. in [place]? --     Most recent vital signs: Vitals:   08/13/21 1338 08/13/21 1603  BP: (!) 163/81 (!) 175/77  Pulse: 93 89  Resp: 18 18  Temp: 98.1 F (36.7 C)   SpO2: 97% 100%    General: Awake, alert, oriented. CV:  Good peripheral perfusion. Regular rate and rhythm. Resp:  Normal effort. Lungs clear. Abd:  No distention. Minimally tender to palpation in the epigastric region.   ED Results / Procedures / Treatments   Labs (all labs ordered are listed, but only abnormal results are displayed) Labs Reviewed  COMPREHENSIVE METABOLIC PANEL - Abnormal; Notable for the following components:      Result Value   Glucose, Bld 109 (*)    Creatinine, Ser 1.24 (*)    Alkaline Phosphatase 236 (*)    GFR, Estimated 49 (*)    All other components  within normal limits  ETHANOL - Abnormal; Notable for the following components:   Alcohol, Ethyl (B) 135 (*)    All other components within normal limits  CBC  POC OCCULT BLOOD, ED     EKG  None   RADIOLOGY None   PROCEDURES:  Critical Care performed: No  Procedures   MEDICATIONS ORDERED IN ED: Medications - No data to display   IMPRESSION / MDM / [place] / ED COURSE  I reviewed the triage vital signs and the nursing notes.                              Differential diagnosis includes, but is not limited to, ulcer, gastritis, gastroenteritis, AVM, cancer.  Patient's presentation is most consistent with acute presentation with potential threat to life or bodily function.  Patient presented to the emergency department today because of concerns for dark stool and coffee-ground emesis.  Patient's description is concerning for possible GI bleed.  Initial vital signs however without any concerning  tachycardia or hypotension.  Initial hemoglobin was normal.  Did have a discussion with the patient.  Did discuss rechecking hemoglobin to assess for any significant change.  However prior to repeat hemoglobin being ordered and drawn patient had eloped from the emergency department.   FINAL CLINICAL IMPRESSION(S) / ED DIAGNOSES   Gi bleed    Note:  This document was prepared using Dragon voice recognition software and may include unintentional dictation errors.    Nance Pear, MD 08/13/21 2017

## 2021-08-13 NOTE — ED Notes (Addendum)
This RN went to reassess patient. Patient not found in room. This RN looked for patient in department and lobby. Security called and was unable to locate patient. Chare RN aware of patient eloping.

## 2021-08-13 NOTE — ED Triage Notes (Signed)
Patient states she was sober for 5 years however just had a close family death and started drinking again. Now having coffee ground emesis and dark starry stools x 5 days. Reports last alcohol intake was yesterday.

## 2021-10-30 DIAGNOSIS — R748 Abnormal levels of other serum enzymes: Secondary | ICD-10-CM | POA: Insufficient documentation

## 2021-10-30 DIAGNOSIS — R339 Retention of urine, unspecified: Secondary | ICD-10-CM | POA: Insufficient documentation

## 2021-10-30 DIAGNOSIS — J029 Acute pharyngitis, unspecified: Secondary | ICD-10-CM | POA: Insufficient documentation

## 2021-10-30 HISTORY — DX: Abnormal levels of other serum enzymes: R74.8

## 2021-11-08 DIAGNOSIS — Q453 Other congenital malformations of pancreas and pancreatic duct: Secondary | ICD-10-CM | POA: Insufficient documentation

## 2021-11-08 DIAGNOSIS — T402X5A Adverse effect of other opioids, initial encounter: Secondary | ICD-10-CM | POA: Insufficient documentation

## 2022-02-16 ENCOUNTER — Other Ambulatory Visit: Payer: Self-pay | Admitting: Family

## 2022-03-10 ENCOUNTER — Other Ambulatory Visit: Payer: Self-pay

## 2022-03-10 MED ORDER — MOUNJARO 2.5 MG/0.5ML ~~LOC~~ SOAJ: 2.5000 mg | SUBCUTANEOUS | 1 refills | Status: DC

## 2022-03-16 ENCOUNTER — Other Ambulatory Visit: Payer: Self-pay

## 2022-03-17 MED ORDER — HYDROCODONE-ACETAMINOPHEN 5-325 MG PO TABS: ORAL_TABLET | ORAL | 0 refills | Status: DC

## 2022-03-18 ENCOUNTER — Other Ambulatory Visit: Payer: Self-pay | Admitting: Family

## 2022-04-02 ENCOUNTER — Ambulatory Visit: Payer: Medicaid Other | Admitting: Family

## 2022-04-02 ENCOUNTER — Encounter: Payer: Self-pay | Admitting: Family

## 2022-04-02 VITALS — BP 120/62 | HR 89 | Ht 60.0 in | Wt 109.0 lb

## 2022-04-02 DIAGNOSIS — E559 Vitamin D deficiency, unspecified: Secondary | ICD-10-CM

## 2022-04-02 DIAGNOSIS — E538 Deficiency of other specified B group vitamins: Secondary | ICD-10-CM

## 2022-04-02 DIAGNOSIS — E782 Mixed hyperlipidemia: Secondary | ICD-10-CM

## 2022-04-02 DIAGNOSIS — E1122 Type 2 diabetes mellitus with diabetic chronic kidney disease: Secondary | ICD-10-CM

## 2022-04-02 DIAGNOSIS — Z794 Long term (current) use of insulin: Secondary | ICD-10-CM

## 2022-04-02 DIAGNOSIS — E1165 Type 2 diabetes mellitus with hyperglycemia: Secondary | ICD-10-CM | POA: Diagnosis not present

## 2022-04-02 DIAGNOSIS — I10 Essential (primary) hypertension: Secondary | ICD-10-CM | POA: Diagnosis not present

## 2022-04-02 DIAGNOSIS — N1831 Chronic kidney disease, stage 3a: Secondary | ICD-10-CM

## 2022-04-02 DIAGNOSIS — F10929 Alcohol use, unspecified with intoxication, unspecified: Secondary | ICD-10-CM

## 2022-04-02 DIAGNOSIS — E039 Hypothyroidism, unspecified: Secondary | ICD-10-CM

## 2022-04-02 DIAGNOSIS — K860 Alcohol-induced chronic pancreatitis: Secondary | ICD-10-CM | POA: Insufficient documentation

## 2022-04-02 LAB — POCT CBG (FASTING - GLUCOSE)-MANUAL ENTRY: Glucose Fasting, POC: 284 mg/dL — AB (ref 70–99)

## 2022-04-02 MED ORDER — ZENPEP 40000-126000 UNITS PO CPEP: ORAL_CAPSULE | ORAL | 3 refills | Status: DC

## 2022-04-02 MED ORDER — DIAZEPAM 2 MG PO TABS: 2.0000 mg | ORAL_TABLET | Freq: Two times a day (BID) | ORAL | 2 refills | Status: DC | PRN

## 2022-04-02 NOTE — Progress Notes (Addendum)
Established Patient Office Visit  Subjective:  Patient ID: Karen [name], female    DOB: [birth year]  Age: 67 y.o. MRN: [id number]  Chief Complaint  Patient presents with   Follow-up    3 month follow up    Patient is here today for her 3 months follow up.  She has been feeling well since last appointment.   She does not have additional concerns to discuss today.  She has been to [facility] for further evaluation of her issues with her bladder, they are planning to do a biopsy.   Labs are due today. She needs refills.   I have reviewed her active problem list, medication list, allergies, social history, notes from last encounter, lab results, and specialist notes for her appointment today.   No other concerns at this time.   Past Medical History:  Diagnosis Date   Abdominal pain    Acute cystitis 05/18/2020   Acute kidney injury superimposed on CKD ([place])    Acute renal insufficiency 10/29/2015   Adhesive capsulitis of left shoulder    Alcoholic pancreatitis    Asthma    Depression    Diabetes mellitus without complication ([place])    Diabetic cataract of right eye ([place]) 08/31/2019   Formatting of this note might be different from the original. Added automatically from request for surgery [id number]   DKA (diabetic ketoacidoses) 02/17/2015   Elevated alkaline phosphatase level 10/30/2021   Emphysematous cystitis    Esophageal candidiasis (HCC)    ETOH abuse 01/03/2017   Headache 02/26/2019   Hypercholesteremia    Hypertension    Hypertensive urgency 04/21/2016   Hypokalemia    Left flank pain 10/29/2015   Left upper quadrant abdominal pain 05/18/2020   Leukocytosis 06/13/2019   Malignant essential hypertension 10/29/2015   Upper GI bleed 11/11/2017   UTI (urinary tract infection) 01/24/2018    Past Surgical History:  Procedure Laterality Date   [place]   ESOPHAGOGASTRODUODENOSCOPY N/A 11/12/2017   Procedure:  ESOPHAGOGASTRODUODENOSCOPY (EGD);  Surgeon: Lin Landsman, MD;  Location: [facility] ENDOSCOPY;  Service: Gastroenterology;  Laterality: N/A;   ESOPHAGOGASTRODUODENOSCOPY N/A 09/29/2018   Procedure: ESOPHAGOGASTRODUODENOSCOPY (EGD);  Surgeon: Toledo, Benay Pike, MD;  Location: [facility] ENDOSCOPY;  Service: Gastroenterology;  Laterality: N/A;   ESOPHAGOGASTRODUODENOSCOPY (EGD) WITH PROPOFOL N/A 04/24/2016   Procedure: ESOPHAGOGASTRODUODENOSCOPY (EGD) WITH PROPOFOL;  Surgeon: [name], MD;  Location: [facility] ENDOSCOPY;  Service: Endoscopy;  Laterality: N/A;   ESOPHAGOGASTRODUODENOSCOPY (EGD) WITH PROPOFOL N/A 01/28/2019   Procedure: ESOPHAGOGASTRODUODENOSCOPY (EGD) WITH PROPOFOL;  Surgeon: Toledo, Benay Pike, MD;  Location: [facility] ENDOSCOPY;  Service: Gastroenterology;  Laterality: N/A;   EYE SURGERY     HAND SURGERY  1988   THYROID SURGERY  2013    Social History   Socioeconomic History   Marital status: Single    Spouse name: Not on file   Number of children: Not on file   Years of education: Not on file   Highest education level: Not on file  Occupational History   Occupation: disabled  Tobacco Use   Smoking status: Former   Smokeless tobacco: Never   Tobacco comments:    quit 7 years ago   Vaping Use   Vaping Use: Never used  Substance and Sexual Activity   Alcohol use: Yes    Comment: drank wine yesterday   Drug use: No   Sexual activity: Not on file  Other Topics Concern   Not on file  Social History  Narrative   Not on file   Social Determinants of Health   Financial Resource Strain: Not on file  Food Insecurity: Not on file  Transportation Needs: Not on file  Physical Activity: Not on file  Stress: Not on file  Social Connections: Not on file  Intimate Partner Violence: Not on file    Family History  Problem Relation Age of Onset   Diabetes Mother     Allergies  Allergen Reactions   Metoclopramide Other (See Comments) and Shortness Of Breath    Hypotension,  shortness of breath  Hypotension, shortness of breath  Hypotension, shortness of breath   Penicillins Anaphylaxis, Rash and Swelling    Has patient had a PCN reaction causing immediate rash, facial/tongue/throat swelling, SOB or lightheadedness with hypotension: Yes  Has patient had a PCN reaction causing severe rash involving mucus membranes or skin necrosis: No  Has patient had a PCN reaction that required hospitalization No  Has patient had a PCN reaction occurring within the last 10 years: No  If all of the above answers are "NO", then may proceed with Cephalosporin use.  Has patient had a PCN reaction causing immediate rash, facial/tongue/throat swelling, SOB or lightheadedness with hypotension: Yes  Has patient had a PCN reaction causing severe rash involving mucus membranes or skin necrosis: No  Has patient had a PCN reaction that required hospitalization No  Has patient had a PCN reaction occurring within the last 10 years: No  If all of the above answers are "NO", then may proceed with Cephalosporin use.   Fentanyl Rash    Rash   Other Rash    Bee sting   Bee sting   Oxycodone-Acetaminophen Rash    plain tylenol can also make itch?  plain tylenol can also make itch?  plain tylenol can also make itch?   Percocet [Oxycodone-Acetaminophen] Rash    plain tylenol can also make itch?    Review of Systems  Musculoskeletal:  Positive for myalgias and neck pain.  All other systems reviewed and are negative.      Objective:   BP 120/62   Pulse 89   Ht 5' (1.524 m)   Wt 109 lb (49.4 kg)   SpO2 99%   BMI 21.29 kg/m   Vitals:   04/02/22 1042  BP: 120/62  Pulse: 89  Height: 5' (1.524 m)  Weight: 109 lb (49.4 kg)  SpO2: 99%  BMI (Calculated): 21.29    Physical Exam Vitals and nursing note reviewed.  Constitutional:      Appearance: Normal appearance. She is normal weight.  HENT:     Head: Normocephalic.  Eyes:     Extraocular Movements: Extraocular  movements intact.     Conjunctiva/sclera: Conjunctivae normal.     Pupils: Pupils are equal, round, and reactive to light.  Cardiovascular:     Rate and Rhythm: Normal rate.  Pulmonary:     Effort: Pulmonary effort is normal.     Breath sounds: Normal breath sounds.  Musculoskeletal:        General: Tenderness present.  Neurological:     General: No focal deficit present.     Mental Status: She is alert and oriented to person, place, and time. Mental status is at baseline.  Psychiatric:        Mood and Affect: Mood normal.        Behavior: Behavior normal.        Thought Content: Thought content normal.  Judgment: Judgment normal.      Results for orders placed or performed in visit on 04/02/22  Lipid panel  Result Value Ref Range   Cholesterol, Total 186 100 - 199 mg/dL   Triglycerides 248 (H) 0 - 149 mg/dL   HDL 48 >39 mg/dL   VLDL Cholesterol Cal 42 (H) 5 - 40 mg/dL   LDL Chol Calc (NIH) 96 0 - 99 mg/dL   Chol/HDL Ratio 3.9 0.0 - 4.4 ratio  VITAMIN D 25 Hydroxy (Vit-D Deficiency, Fractures)  Result Value Ref Range   Vit D, 25-Hydroxy 21.7 (L) 30.0 - 100.0 ng/mL  CBC With Differential  Result Value Ref Range   WBC 6.4 3.4 - 10.8 x10E3/uL   RBC 4.25 3.77 - 5.28 x10E6/uL   Hemoglobin 12.2 11.1 - 15.9 g/dL   Hematocrit 36.9 34.0 - 46.6 %   MCV 87 79 - 97 fL   MCH 28.7 26.6 - 33.0 pg   MCHC 33.1 31.5 - 35.7 g/dL   RDW 12.9 11.7 - 15.4 %   Neutrophils 46 Not Estab. %   Lymphs 40 Not Estab. %   Monocytes 9 Not Estab. %   Eos 3 Not Estab. %   Basos 1 Not Estab. %   Neutrophils Absolute 3.0 1.4 - 7.0 x10E3/uL   Lymphocytes Absolute 2.6 0.7 - 3.1 x10E3/uL   Monocytes Absolute 0.6 0.1 - 0.9 x10E3/uL   EOS (ABSOLUTE) 0.2 0.0 - 0.4 x10E3/uL   Basophils Absolute 0.1 0.0 - 0.2 x10E3/uL   Immature Granulocytes 1 Not Estab. %   Immature Grans (Abs) 0.0 0.0 - 0.1 x10E3/uL  CMP14+EGFR  Result Value Ref Range   Glucose 234 (H) 70 - 99 mg/dL   BUN 35 (H) 8 - 27 mg/dL    Creatinine, Ser 2.27 (H) 0.57 - 1.00 mg/dL   eGFR 23 (L) >59 mL/min/1.73   BUN/Creatinine Ratio 15 12 - 28   Sodium 134 134 - 144 mmol/L   Potassium 4.6 3.5 - 5.2 mmol/L   Chloride 96 96 - 106 mmol/L   CO2 19 (L) 20 - 29 mmol/L   Calcium 9.1 8.7 - 10.3 mg/dL   Total Protein 7.2 6.0 - 8.5 g/dL   Albumin 4.1 3.9 - 4.9 g/dL   Globulin, Total 3.1 1.5 - 4.5 g/dL   Albumin/Globulin Ratio 1.3 1.2 - 2.2   Bilirubin Total <0.2 0.0 - 1.2 mg/dL   Alkaline Phosphatase 341 (H) 44 - 121 IU/L   AST 38 0 - 40 IU/L   ALT 36 (H) 0 - 32 IU/L  TSH  Result Value Ref Range   TSH 2.610 0.450 - 4.500 uIU/mL  Hemoglobin A1c  Result Value Ref Range   Hgb A1c MFr Bld 10.2 (H) 4.8 - 5.6 %   Est. average glucose Bld gHb Est-mCnc 246 mg/dL  Vitamin B12  Result Value Ref Range   Vitamin B-12 693 232 - 1,245 pg/mL  POCT CBG (Fasting - Glucose)  Result Value Ref Range   Glucose Fasting, POC 284 (A) 70 - 99 mg/dL    Recent Results (from the past 2160 hour(s))  POCT CBG (Fasting - Glucose)     Status: Abnormal   Collection Time: 04/02/22 10:46 AM  Result Value Ref Range   Glucose Fasting, POC 284 (A) 70 - 99 mg/dL  Lipid panel     Status: Abnormal   Collection Time: 04/02/22 11:20 AM  Result Value Ref Range   Cholesterol, Total 186 100 - 199 mg/dL   Triglycerides  248 (H) 0 - 149 mg/dL   HDL 48 >39 mg/dL   VLDL Cholesterol Cal 42 (H) 5 - 40 mg/dL   LDL Chol Calc (NIH) 96 0 - 99 mg/dL   Chol/HDL Ratio 3.9 0.0 - 4.4 ratio    Comment:                                   T. Chol/HDL Ratio                                             Men  Women                               1/2 Avg.Risk  3.4    3.3                                   Avg.Risk  5.0    4.4                                2X Avg.Risk  9.6    7.1                                3X Avg.Risk 23.4   11.0   VITAMIN D 25 Hydroxy (Vit-D Deficiency, Fractures)     Status: Abnormal   Collection Time: 04/02/22 11:20 AM  Result Value Ref Range   Vit D,  25-Hydroxy 21.7 (L) 30.0 - 100.0 ng/mL    Comment: Vitamin D deficiency has been defined by the [place] practice guideline as a level of serum 25-OH vitamin D less than 20 ng/mL (1,2). The Endocrine Society went on to further define vitamin D insufficiency as a level between 21 and 29 ng/mL (2). 1. IOM (Institute of Medicine). 2010. Dietary reference    intakes for calcium and D. [place]: The    [employer]. 2. Holick MF, Binkley [place], Bischoff-Ferrari HA, et al.    Evaluation, treatment, and prevention of vitamin D    deficiency: an Endocrine Society clinical practice    guideline. JCEM. 2011 Jul; 96(7):1911-30.   CBC With Differential     Status: None   Collection Time: 04/02/22 11:20 AM  Result Value Ref Range   WBC 6.4 3.4 - 10.8 x10E3/uL   RBC 4.25 3.77 - 5.28 x10E6/uL   Hemoglobin 12.2 11.1 - 15.9 g/dL   Hematocrit 36.9 34.0 - 46.6 %   MCV 87 79 - 97 fL   MCH 28.7 26.6 - 33.0 pg   MCHC 33.1 31.5 - 35.7 g/dL   RDW 12.9 11.7 - 15.4 %   Neutrophils 46 Not Estab. %   Lymphs 40 Not Estab. %   Monocytes 9 Not Estab. %   Eos 3 Not Estab. %   Basos 1 Not Estab. %   Neutrophils Absolute 3.0 1.4 - 7.0 x10E3/uL   Lymphocytes Absolute 2.6 0.7 - 3.1 x10E3/uL   Monocytes Absolute 0.6 0.1 - 0.9 x10E3/uL   EOS (ABSOLUTE) 0.2 0.0 - 0.4 x10E3/uL  Basophils Absolute 0.1 0.0 - 0.2 x10E3/uL   Immature Granulocytes 1 Not Estab. %   Immature Grans (Abs) 0.0 0.0 - 0.1 x10E3/uL  CMP14+EGFR     Status: Abnormal   Collection Time: 04/02/22 11:20 AM  Result Value Ref Range   Glucose 234 (H) 70 - 99 mg/dL   BUN 35 (H) 8 - 27 mg/dL   Creatinine, Ser 2.27 (H) 0.57 - 1.00 mg/dL   eGFR 23 (L) >59 mL/min/1.73   BUN/Creatinine Ratio 15 12 - 28   Sodium 134 134 - 144 mmol/L   Potassium 4.6 3.5 - 5.2 mmol/L   Chloride 96 96 - 106 mmol/L   CO2 19 (L) 20 - 29 mmol/L   Calcium 9.1 8.7 - 10.3 mg/dL   Total Protein 7.2 6.0 - 8.5 g/dL   Albumin  4.1 3.9 - 4.9 g/dL   Globulin, Total 3.1 1.5 - 4.5 g/dL   Albumin/Globulin Ratio 1.3 1.2 - 2.2   Bilirubin Total <0.2 0.0 - 1.2 mg/dL   Alkaline Phosphatase 341 (H) 44 - 121 IU/L   AST 38 0 - 40 IU/L   ALT 36 (H) 0 - 32 IU/L  TSH     Status: None   Collection Time: 04/02/22 11:20 AM  Result Value Ref Range   TSH 2.610 0.450 - 4.500 uIU/mL  Hemoglobin A1c     Status: Abnormal   Collection Time: 04/02/22 11:20 AM  Result Value Ref Range   Hgb A1c MFr Bld 10.2 (H) 4.8 - 5.6 %    Comment:          Prediabetes: 5.7 - 6.4          Diabetes: >6.4          Glycemic control for adults with diabetes: <7.0    Est. average glucose Bld gHb Est-mCnc 246 mg/dL  Vitamin B12     Status: None   Collection Time: 04/02/22 11:20 AM  Result Value Ref Range   Vitamin B-12 693 232 - 1,245 pg/mL      Assessment & Plan:   Problem List Items Addressed This Visit     Hypertension   Relevant Medications   NEXLIZET 180-10 MG TABS   Other Relevant Orders   CBC With Differential (Completed)   CMP14+EGFR (Completed)   HLD (hyperlipidemia)   Relevant Medications   NEXLIZET 180-10 MG TABS   Other Relevant Orders   Lipid panel (Completed)   CBC With Differential (Completed)   CMP14+EGFR (Completed)   Type II diabetes mellitus with renal manifestations   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   Relevant Medications   Pancrelipase, Lip-Prot-Amyl, (ZENPEP) 40000-126000 units CPEP   Other Visit Diagnoses     Type 2 diabetes mellitus with hyperglycemia, without long-term current use of insulin (HCC)    -  Primary   Relevant Orders   POCT CBG (Fasting - Glucose) (Completed)   CBC With Differential (Completed)   CMP14+EGFR (Completed)   Hemoglobin A1c (Completed)   Vitamin D deficiency, unspecified       Relevant Orders   VITAMIN D 25 Hydroxy (Vit-D Deficiency, Fractures) (Completed)   CBC With Differential (Completed)   CMP14+EGFR (Completed)   B12 deficiency due to diet        Relevant Orders   CBC With Differential (Completed)   CMP14+EGFR (Completed)   Vitamin B12 (Completed)   Hypothyroidism (acquired)       Relevant Orders   CBC With Differential (Completed)   CMP14+EGFR (Completed)  TSH (Completed)     Patient is still on her Oxygen for Emphysema. She is using it regularly and is benefiting from therapy.   Return in about 3 months (around 07/03/2022) for F/U.   Total time spent: 30 minutes  Mechele Claude, FNP  04/02/2022

## 2022-04-04 LAB — CMP14+EGFR
ALT: 36 IU/L — ABNORMAL HIGH (ref 0–32)
AST: 38 IU/L (ref 0–40)
Albumin/Globulin Ratio: 1.3 (ref 1.2–2.2)
Albumin: 4.1 g/dL (ref 3.9–4.9)
Alkaline Phosphatase: 341 IU/L — ABNORMAL HIGH (ref 44–121)
BUN/Creatinine Ratio: 15 (ref 12–28)
BUN: 35 mg/dL — ABNORMAL HIGH (ref 8–27)
Bilirubin Total: <0.2 mg/dL (ref 0.0–1.2)
CO2: 19 mmol/L — ABNORMAL LOW (ref 20–29)
Calcium: 9.1 mg/dL (ref 8.7–10.3)
Chloride: 96 mmol/L (ref 96–106)
Creatinine, Ser: 2.27 mg/dL — ABNORMAL HIGH (ref 0.57–1.00)
Globulin, Total: 3.1 g/dL (ref 1.5–4.5)
Glucose: 234 mg/dL — ABNORMAL HIGH (ref 70–99)
Potassium: 4.6 mmol/L (ref 3.5–5.2)
Sodium: 134 mmol/L (ref 134–144)
Total Protein: 7.2 g/dL (ref 6.0–8.5)
eGFR: 23 mL/min/{1.73_m2} — ABNORMAL LOW (ref >59)

## 2022-04-04 LAB — LIPID PANEL
Chol/HDL Ratio: 3.9 ratio (ref 0.0–4.4)
Cholesterol, Total: 186 mg/dL (ref 100–199)
HDL: 48 mg/dL (ref >39)
LDL Chol Calc (NIH): 96 mg/dL (ref 0–99)
Triglycerides: 248 mg/dL — ABNORMAL HIGH (ref 0–149)
VLDL Cholesterol Cal: 42 mg/dL — ABNORMAL HIGH (ref 5–40)

## 2022-04-04 LAB — CBC WITH DIFFERENTIAL
Basophils Absolute: 0.1 10*3/uL (ref 0.0–0.2)
Basos: 1 %
EOS (ABSOLUTE): 0.2 10*3/uL (ref 0.0–0.4)
Eos: 3 %
Hematocrit: 36.9 % (ref 34.0–46.6)
Hemoglobin: 12.2 g/dL (ref 11.1–15.9)
Immature Grans (Abs): 0 10*3/uL (ref 0.0–0.1)
Immature Granulocytes: 1 %
Lymphocytes Absolute: 2.6 10*3/uL (ref 0.7–3.1)
Lymphs: 40 %
MCH: 28.7 pg (ref 26.6–33.0)
MCHC: 33.1 g/dL (ref 31.5–35.7)
MCV: 87 fL (ref 79–97)
Monocytes Absolute: 0.6 10*3/uL (ref 0.1–0.9)
Monocytes: 9 %
Neutrophils Absolute: 3 10*3/uL (ref 1.4–7.0)
Neutrophils: 46 %
RBC: 4.25 x10E6/uL (ref 3.77–5.28)
RDW: 12.9 % (ref 11.7–15.4)
WBC: 6.4 10*3/uL (ref 3.4–10.8)

## 2022-04-04 LAB — VITAMIN B12: Vitamin B-12: 693 pg/mL (ref 232–1245)

## 2022-04-04 LAB — TSH: TSH: 2.61 u[IU]/mL (ref 0.450–4.500)

## 2022-04-04 LAB — VITAMIN D 25 HYDROXY (VIT D DEFICIENCY, FRACTURES): Vit D, 25-Hydroxy: 21.7 ng/mL — ABNORMAL LOW (ref 30.0–100.0)

## 2022-04-04 LAB — HEMOGLOBIN A1C
Est. average glucose Bld gHb Est-mCnc: 246 mg/dL
Hgb A1c MFr Bld: 10.2 % — ABNORMAL HIGH (ref 4.8–5.6)

## 2022-04-06 ENCOUNTER — Other Ambulatory Visit: Payer: Self-pay | Admitting: Family

## 2022-04-17 ENCOUNTER — Other Ambulatory Visit: Payer: Self-pay

## 2022-04-17 ENCOUNTER — Other Ambulatory Visit: Payer: Self-pay | Admitting: Family

## 2022-04-20 MED ORDER — HYDROCODONE-ACETAMINOPHEN 5-325 MG PO TABS: ORAL_TABLET | ORAL | 0 refills | Status: DC

## 2022-05-07 ENCOUNTER — Ambulatory Visit
Admission: RE | Admit: 2022-05-07 | Discharge: 2022-05-07 | Disposition: A | Discharge status: Home / Self Care | Payer: Medicaid Other | Source: Ambulatory Visit | Attending: Unknown Physician Specialty | Admitting: Unknown Physician Specialty

## 2022-05-07 DIAGNOSIS — E041 Nontoxic single thyroid nodule: Secondary | ICD-10-CM

## 2022-05-08 ENCOUNTER — Telehealth: Payer: Self-pay | Admitting: Family

## 2022-05-08 NOTE — Telephone Encounter (Signed)
Megan from [facility] called needing chart notes that show oxygen use or benefit.  Fax notes to [phone]

## 2022-05-22 ENCOUNTER — Other Ambulatory Visit: Payer: Self-pay

## 2022-05-22 ENCOUNTER — Other Ambulatory Visit: Payer: Self-pay | Admitting: Family

## 2022-05-27 ENCOUNTER — Other Ambulatory Visit: Payer: Self-pay | Admitting: Internal Medicine

## 2022-05-29 ENCOUNTER — Other Ambulatory Visit: Payer: Self-pay | Admitting: Family

## 2022-06-24 ENCOUNTER — Other Ambulatory Visit: Payer: Self-pay | Admitting: Family

## 2022-07-01 ENCOUNTER — Other Ambulatory Visit: Payer: Self-pay

## 2022-07-01 MED ORDER — NOVOLOG MIX 70/30 FLEXPEN (70-30) 100 UNIT/ML ~~LOC~~ SUPN: PEN_INJECTOR | SUBCUTANEOUS | 3 refills | Status: DC

## 2022-07-01 MED ORDER — LANTUS SOLOSTAR 100 UNIT/ML ~~LOC~~ SOPN: PEN_INJECTOR | SUBCUTANEOUS | 5 refills | Status: DC

## 2022-07-03 ENCOUNTER — Ambulatory Visit: Payer: Medicaid Other | Admitting: Family

## 2022-07-07 ENCOUNTER — Other Ambulatory Visit: Payer: Self-pay

## 2022-07-07 ENCOUNTER — Ambulatory Visit: Payer: Medicaid Other | Admitting: Family

## 2022-07-07 VITALS — BP 140/70 | HR 84 | Ht 60.0 in | Wt 113.8 lb

## 2022-07-07 DIAGNOSIS — I10 Essential (primary) hypertension: Secondary | ICD-10-CM

## 2022-07-07 DIAGNOSIS — E559 Vitamin D deficiency, unspecified: Secondary | ICD-10-CM | POA: Diagnosis not present

## 2022-07-07 DIAGNOSIS — F1020 Alcohol dependence, uncomplicated: Secondary | ICD-10-CM

## 2022-07-07 DIAGNOSIS — Z794 Long term (current) use of insulin: Secondary | ICD-10-CM

## 2022-07-07 DIAGNOSIS — E1169 Type 2 diabetes mellitus with other specified complication: Secondary | ICD-10-CM

## 2022-07-07 DIAGNOSIS — E782 Mixed hyperlipidemia: Secondary | ICD-10-CM

## 2022-07-07 DIAGNOSIS — K860 Alcohol-induced chronic pancreatitis: Secondary | ICD-10-CM

## 2022-07-07 DIAGNOSIS — E1165 Type 2 diabetes mellitus with hyperglycemia: Secondary | ICD-10-CM

## 2022-07-07 DIAGNOSIS — E785 Hyperlipidemia, unspecified: Secondary | ICD-10-CM

## 2022-07-07 DIAGNOSIS — E538 Deficiency of other specified B group vitamins: Secondary | ICD-10-CM

## 2022-07-07 DIAGNOSIS — N1832 Chronic kidney disease, stage 3b: Secondary | ICD-10-CM

## 2022-07-07 DIAGNOSIS — E039 Hypothyroidism, unspecified: Secondary | ICD-10-CM

## 2022-07-08 ENCOUNTER — Other Ambulatory Visit: Payer: Self-pay | Admitting: Family

## 2022-07-08 LAB — CMP14+EGFR
ALT: 45 IU/L — ABNORMAL HIGH (ref 0–32)
AST: 46 IU/L — ABNORMAL HIGH (ref 0–40)
Albumin: 4 g/dL (ref 3.9–4.9)
Alkaline Phosphatase: 458 IU/L — ABNORMAL HIGH (ref 44–121)
BUN/Creatinine Ratio: 16 (ref 12–28)
BUN: 31 mg/dL — ABNORMAL HIGH (ref 8–27)
Bilirubin Total: 0.3 mg/dL (ref 0.0–1.2)
CO2: 24 mmol/L (ref 20–29)
Calcium: 9.4 mg/dL (ref 8.7–10.3)
Chloride: 97 mmol/L (ref 96–106)
Creatinine, Ser: 1.99 mg/dL — ABNORMAL HIGH (ref 0.57–1.00)
Globulin, Total: 3.1 g/dL (ref 1.5–4.5)
Glucose: 138 mg/dL — ABNORMAL HIGH (ref 70–99)
Potassium: 5 mmol/L (ref 3.5–5.2)
Sodium: 138 mmol/L (ref 134–144)
Total Protein: 7.1 g/dL (ref 6.0–8.5)
eGFR: 27 mL/min/{1.73_m2} — ABNORMAL LOW (ref >59)

## 2022-07-08 LAB — CBC WITH DIFFERENTIAL
Basophils Absolute: 0.1 10*3/uL (ref 0.0–0.2)
Basos: 1 %
EOS (ABSOLUTE): 0.2 10*3/uL (ref 0.0–0.4)
Eos: 3 %
Hematocrit: 33 % — ABNORMAL LOW (ref 34.0–46.6)
Hemoglobin: 10.6 g/dL — ABNORMAL LOW (ref 11.1–15.9)
Immature Grans (Abs): 0.1 10*3/uL (ref 0.0–0.1)
Immature Granulocytes: 1 %
Lymphocytes Absolute: 2.9 10*3/uL (ref 0.7–3.1)
Lymphs: 33 %
MCH: 28.3 pg (ref 26.6–33.0)
MCHC: 32.1 g/dL (ref 31.5–35.7)
MCV: 88 fL (ref 79–97)
Monocytes Absolute: 0.7 10*3/uL (ref 0.1–0.9)
Monocytes: 8 %
Neutrophils Absolute: 4.9 10*3/uL (ref 1.4–7.0)
Neutrophils: 54 %
RBC: 3.75 x10E6/uL — ABNORMAL LOW (ref 3.77–5.28)
RDW: 12.3 % (ref 11.7–15.4)
WBC: 8.8 10*3/uL (ref 3.4–10.8)

## 2022-07-08 LAB — VITAMIN D 25 HYDROXY (VIT D DEFICIENCY, FRACTURES): Vit D, 25-Hydroxy: 27.9 ng/mL — ABNORMAL LOW (ref 30.0–100.0)

## 2022-07-08 LAB — LIPID PANEL
Chol/HDL Ratio: 2.2 ratio (ref 0.0–4.4)
Cholesterol, Total: 140 mg/dL (ref 100–199)
HDL: 63 mg/dL (ref >39)
LDL Chol Calc (NIH): 52 mg/dL (ref 0–99)
Triglycerides: 147 mg/dL (ref 0–149)
VLDL Cholesterol Cal: 25 mg/dL (ref 5–40)

## 2022-07-08 LAB — TSH: TSH: 4.32 u[IU]/mL (ref 0.450–4.500)

## 2022-07-08 LAB — HEMOGLOBIN A1C
Est. average glucose Bld gHb Est-mCnc: 192 mg/dL
Hgb A1c MFr Bld: 8.3 % — ABNORMAL HIGH (ref 4.8–5.6)

## 2022-07-08 LAB — VITAMIN B12: Vitamin B-12: 732 pg/mL (ref 232–1245)

## 2022-07-10 NOTE — Progress Notes (Unsigned)
Established Patient Office Visit  Subjective:  Patient ID: Karen [name], female    DOB: October 02, 1955  Age: 67 y.o. MRN: [id number]  Chief Complaint  Patient presents with   Follow-up    Refills    Patient is here today for her 3 months follow up.  She has been feeling well since last appointment.   She does not have additional concerns to discuss today.  Labs are due today. She needs refills.   I have reviewed her active problem list, medication list, allergies, notes from last encounter, lab results for her appointment today.    No other concerns at this time.   Past Medical History:  Diagnosis Date   Abdominal pain    Acute cystitis 05/18/2020   Acute kidney injury superimposed on CKD (HCC)    Acute on chronic pancreatitis (HCC) 01/03/2017   Acute renal insufficiency 10/29/2015   Adhesive capsulitis of left shoulder    Alcoholic pancreatitis    Asthma    Depression    Diabetes mellitus without complication (HCC)    Diabetic cataract of right eye (HCC) 08/31/2019   Formatting of this note might be different from the original. Added automatically from request for surgery [id number]   DKA (diabetic ketoacidoses) 02/17/2015   Elevated alkaline phosphatase level 10/30/2021   Emphysematous cystitis    Esophageal candidiasis (HCC)    ETOH abuse 01/03/2017   Headache 02/26/2019   Hypercholesteremia    Hypertension    Hypertensive urgency 04/21/2016   Hypokalemia    Left flank pain 10/29/2015   Left upper quadrant abdominal pain 05/18/2020   Leukocytosis 06/13/2019   Malignant essential hypertension 10/29/2015   Upper GI bleed 11/11/2017   UTI (urinary tract infection) 01/24/2018    Past Surgical History:  Procedure Laterality Date   ABDOMINAL HYSTERECTOMY  1996   APPENDECTOMY  1997   ESOPHAGOGASTRODUODENOSCOPY N/A 11/12/2017   Procedure: ESOPHAGOGASTRODUODENOSCOPY (EGD);  Surgeon: Toney Reil, MD;  Location: [facility] ENDOSCOPY;  Service: Gastroenterology;   Laterality: N/A;   ESOPHAGOGASTRODUODENOSCOPY N/A 09/29/2018   Procedure: ESOPHAGOGASTRODUODENOSCOPY (EGD);  Surgeon: Toledo, Boykin Nearing, MD;  Location: [facility] ENDOSCOPY;  Service: Gastroenterology;  Laterality: N/A;   ESOPHAGOGASTRODUODENOSCOPY (EGD) WITH PROPOFOL N/A 04/24/2016   Procedure: ESOPHAGOGASTRODUODENOSCOPY (EGD) WITH PROPOFOL;  Surgeon: Midge Minium, MD;  Location: [facility] ENDOSCOPY;  Service: Endoscopy;  Laterality: N/A;   ESOPHAGOGASTRODUODENOSCOPY (EGD) WITH PROPOFOL N/A 01/28/2019   Procedure: ESOPHAGOGASTRODUODENOSCOPY (EGD) WITH PROPOFOL;  Surgeon: Toledo, Boykin Nearing, MD;  Location: [facility] ENDOSCOPY;  Service: Gastroenterology;  Laterality: N/A;   EYE SURGERY     HAND SURGERY  1988   THYROID SURGERY  2013    Social History   Socioeconomic History   Marital status: Single    Spouse name: Not on file   Number of children: Not on file   Years of education: Not on file   Highest education level: Not on file  Occupational History   Occupation: disabled  Tobacco Use   Smoking status: Former   Smokeless tobacco: Never   Tobacco comments:    quit 7 years ago   Vaping Use   Vaping Use: Never used  Substance and Sexual Activity   Alcohol use: Yes    Comment: drank wine yesterday   Drug use: No   Sexual activity: Not on file  Other Topics Concern   Not on file  Social History Narrative   Not on file   Social Determinants of Health   Financial Resource Strain: Not on file  Food  Insecurity: Not on file  Transportation Needs: Not on file  Physical Activity: Not on file  Stress: Not on file  Social Connections: Not on file  Intimate Partner Violence: Not on file    Family History  Problem Relation Age of Onset   Diabetes Mother     Allergies  Allergen Reactions   Metoclopramide Other (See Comments) and Shortness Of Breath    Hypotension, shortness of breath  Hypotension, shortness of breath  Hypotension, shortness of breath   Penicillins Anaphylaxis, Rash and  Swelling    Has patient had a PCN reaction causing immediate rash, facial/tongue/throat swelling, SOB or lightheadedness with hypotension: Yes  Has patient had a PCN reaction causing severe rash involving mucus membranes or skin necrosis: No  Has patient had a PCN reaction that required hospitalization No  Has patient had a PCN reaction occurring within the last 10 years: No  If all of the above answers are "NO", then may proceed with Cephalosporin use.  Has patient had a PCN reaction causing immediate rash, facial/tongue/throat swelling, SOB or lightheadedness with hypotension: Yes  Has patient had a PCN reaction causing severe rash involving mucus membranes or skin necrosis: No  Has patient had a PCN reaction that required hospitalization No  Has patient had a PCN reaction occurring within the last 10 years: No  If all of the above answers are "NO", then may proceed with Cephalosporin use.   Fentanyl Rash    Rash   Other Rash    Bee sting   Bee sting   Oxycodone-Acetaminophen Rash    plain tylenol can also make itch?  plain tylenol can also make itch?  plain tylenol can also make itch?   Percocet [Oxycodone-Acetaminophen] Rash    plain tylenol can also make itch?    Review of Systems  All other systems reviewed and are negative.      Objective:   BP (!) 140/70   Pulse 84   Ht 5' (1.524 m)   Wt 113 lb 12.8 oz (51.6 kg)   SpO2 94%   BMI 22.23 kg/m   Vitals:   07/07/22 1435  BP: (!) 140/70  Pulse: 84  Height: 5' (1.524 m)  Weight: 113 lb 12.8 oz (51.6 kg)  SpO2: 94%  BMI (Calculated): 22.22    Physical Exam Vitals and nursing note reviewed.  Constitutional:      Appearance: Normal appearance. She is normal weight.  HENT:     Head: Normocephalic.  Eyes:     Extraocular Movements: Extraocular movements intact.     Conjunctiva/sclera: Conjunctivae normal.     Pupils: Pupils are equal, round, and reactive to light.  Cardiovascular:     Rate and Rhythm:  Normal rate.     Pulses: Normal pulses.  Pulmonary:     Effort: Pulmonary effort is normal.  Musculoskeletal:     Cervical back: Tenderness present. Decreased range of motion.     Lumbar back: Tenderness present. Positive right straight leg raise test and positive left straight leg raise test.  Neurological:     General: No focal deficit present.     Mental Status: She is alert.  Psychiatric:        Mood and Affect: Mood normal.        Behavior: Behavior normal.        Thought Content: Thought content normal.        Judgment: Judgment normal.      Results for orders placed or performed in visit  on 07/07/22  Lipid panel  Result Value Ref Range   Cholesterol, Total 140 100 - 199 mg/dL   Triglycerides 161 0 - 149 mg/dL   HDL 63 >09 mg/dL   VLDL Cholesterol Cal 25 5 - 40 mg/dL   LDL Chol Calc (NIH) 52 0 - 99 mg/dL   Chol/HDL Ratio 2.2 0.0 - 4.4 ratio  VITAMIN D 25 Hydroxy (Vit-D Deficiency, Fractures)  Result Value Ref Range   Vit D, 25-Hydroxy 27.9 (L) 30.0 - 100.0 ng/mL  CBC With Differential  Result Value Ref Range   WBC 8.8 3.4 - 10.8 x10E3/uL   RBC 3.75 (L) 3.77 - 5.28 x10E6/uL   Hemoglobin 10.6 (L) 11.1 - 15.9 g/dL   Hematocrit 60.4 (L) 54.0 - 46.6 %   MCV 88 79 - 97 fL   MCH 28.3 26.6 - 33.0 pg   MCHC 32.1 31.5 - 35.7 g/dL   RDW 98.1 19.1 - 47.8 %   Neutrophils 54 Not Estab. %   Lymphs 33 Not Estab. %   Monocytes 8 Not Estab. %   Eos 3 Not Estab. %   Basos 1 Not Estab. %   Neutrophils Absolute 4.9 1.4 - 7.0 x10E3/uL   Lymphocytes Absolute 2.9 0.7 - 3.1 x10E3/uL   Monocytes Absolute 0.7 0.1 - 0.9 x10E3/uL   EOS (ABSOLUTE) 0.2 0.0 - 0.4 x10E3/uL   Basophils Absolute 0.1 0.0 - 0.2 x10E3/uL   Immature Granulocytes 1 Not Estab. %   Immature Grans (Abs) 0.1 0.0 - 0.1 x10E3/uL  CMP14+EGFR  Result Value Ref Range   Glucose 138 (H) 70 - 99 mg/dL   BUN 31 (H) 8 - 27 mg/dL   Creatinine, Ser 2.95 (H) 0.57 - 1.00 mg/dL   eGFR 27 (L) >62 ZH/YQM/5.78   BUN/Creatinine  Ratio 16 12 - 28   Sodium 138 134 - 144 mmol/L   Potassium 5.0 3.5 - 5.2 mmol/L   Chloride 97 96 - 106 mmol/L   CO2 24 20 - 29 mmol/L   Calcium 9.4 8.7 - 10.3 mg/dL   Total Protein 7.1 6.0 - 8.5 g/dL   Albumin 4.0 3.9 - 4.9 g/dL   Globulin, Total 3.1 1.5 - 4.5 g/dL   Bilirubin Total 0.3 0.0 - 1.2 mg/dL   Alkaline Phosphatase 458 (H) 44 - 121 IU/L   AST 46 (H) 0 - 40 IU/L   ALT 45 (H) 0 - 32 IU/L  TSH  Result Value Ref Range   TSH 4.320 0.450 - 4.500 uIU/mL  Hemoglobin A1c  Result Value Ref Range   Hgb A1c MFr Bld 8.3 (H) 4.8 - 5.6 %   Est. average glucose Bld gHb Est-mCnc 192 mg/dL  Vitamin I69  Result Value Ref Range   Vitamin B-12 732 232 - 1,245 pg/mL    Recent Results (from the past 2160 hour(s))  Lipid panel     Status: None   Collection Time: 07/07/22  3:40 PM  Result Value Ref Range   Cholesterol, Total 140 100 - 199 mg/dL   Triglycerides 629 0 - 149 mg/dL   HDL 63 >52 mg/dL   VLDL Cholesterol Cal 25 5 - 40 mg/dL   LDL Chol Calc (NIH) 52 0 - 99 mg/dL   Chol/HDL Ratio 2.2 0.0 - 4.4 ratio    Comment:  T. Chol/HDL Ratio                                             Men  Women                               1/2 Avg.Risk  3.4    3.3                                   Avg.Risk  5.0    4.4                                2X Avg.Risk  9.6    7.1                                3X Avg.Risk 23.4   11.0   VITAMIN D 25 Hydroxy (Vit-D Deficiency, Fractures)     Status: Abnormal   Collection Time: 07/07/22  3:40 PM  Result Value Ref Range   Vit D, 25-Hydroxy 27.9 (L) 30.0 - 100.0 ng/mL    Comment: Vitamin D deficiency has been defined by the Institute of Medicine and an Endocrine Society practice guideline as a level of serum 25-OH vitamin D less than 20 ng/mL (1,2). The Endocrine Society went on to further define vitamin D insufficiency as a level between 21 and 29 ng/mL (2). 1. IOM (Institute of Medicine). 2010. Dietary reference    intakes  for calcium and D. [place]: The    [employer]. 2. Holick MF, Binkley Alderson, Bischoff-Ferrari HA, et al.    Evaluation, treatment, and prevention of vitamin D    deficiency: an Endocrine Society clinical practice    guideline. JCEM. 2011 Jul; 96(7):1911-30.   CBC With Differential     Status: Abnormal   Collection Time: 07/07/22  3:40 PM  Result Value Ref Range   WBC 8.8 3.4 - 10.8 x10E3/uL   RBC 3.75 (L) 3.77 - 5.28 x10E6/uL   Hemoglobin 10.6 (L) 11.1 - 15.9 g/dL   Hematocrit 16.1 (L) 09.6 - 46.6 %   MCV 88 79 - 97 fL   MCH 28.3 26.6 - 33.0 pg   MCHC 32.1 31.5 - 35.7 g/dL   RDW 04.5 40.9 - 81.1 %   Neutrophils 54 Not Estab. %   Lymphs 33 Not Estab. %   Monocytes 8 Not Estab. %   Eos 3 Not Estab. %   Basos 1 Not Estab. %   Neutrophils Absolute 4.9 1.4 - 7.0 x10E3/uL   Lymphocytes Absolute 2.9 0.7 - 3.1 x10E3/uL   Monocytes Absolute 0.7 0.1 - 0.9 x10E3/uL   EOS (ABSOLUTE) 0.2 0.0 - 0.4 x10E3/uL   Basophils Absolute 0.1 0.0 - 0.2 x10E3/uL   Immature Granulocytes 1 Not Estab. %   Immature Grans (Abs) 0.1 0.0 - 0.1 x10E3/uL    Comment: **Effective August 03, 2022, profile [id number] CBC/Differential**   (No Platelet) will be made non-orderable. [facility] Offers:   [id number] CBC With Differential/Platelet   CMP14+EGFR     Status: Abnormal   Collection Time: 07/07/22  3:40 PM  Result Value Ref Range  Glucose 138 (H) 70 - 99 mg/dL   BUN 31 (H) 8 - 27 mg/dL   Creatinine, Ser 1.47 (H) 0.57 - 1.00 mg/dL   eGFR 27 (L) >82 NF/AOZ/3.08   BUN/Creatinine Ratio 16 12 - 28   Sodium 138 134 - 144 mmol/L   Potassium 5.0 3.5 - 5.2 mmol/L   Chloride 97 96 - 106 mmol/L   CO2 24 20 - 29 mmol/L   Calcium 9.4 8.7 - 10.3 mg/dL   Total Protein 7.1 6.0 - 8.5 g/dL   Albumin 4.0 3.9 - 4.9 g/dL   Globulin, Total 3.1 1.5 - 4.5 g/dL   Bilirubin Total 0.3 0.0 - 1.2 mg/dL   Alkaline Phosphatase 458 (H) 44 - 121 IU/L   AST 46 (H) 0 - 40 IU/L   ALT 45 (H) 0 - 32 IU/L  TSH     Status: None    Collection Time: 07/07/22  3:40 PM  Result Value Ref Range   TSH 4.320 0.450 - 4.500 uIU/mL  Hemoglobin A1c     Status: Abnormal   Collection Time: 07/07/22  3:40 PM  Result Value Ref Range   Hgb A1c MFr Bld 8.3 (H) 4.8 - 5.6 %    Comment:          Prediabetes: 5.7 - 6.4          Diabetes: >6.4          Glycemic control for adults with diabetes: <7.0    Est. average glucose Bld gHb Est-mCnc 192 mg/dL  Vitamin M57     Status: None   Collection Time: 07/07/22  3:40 PM  Result Value Ref Range   Vitamin B-12 732 232 - 1,245 pg/mL       Assessment & Plan:   Problem List Items Addressed This Visit       Active Problems   Hypertension    Blood pressure well controlled with current medications.  Continue current therapy.  Will reassess at follow up.  \      HLD (hyperlipidemia)    Checking labs today.  Continue current therapy for lipid control. Will modify as needed based on labwork results.       Relevant Orders   Lipid panel (Completed)   CBC With Differential (Completed)   CMP14+EGFR (Completed)   Type 2 diabetes mellitus with hyperlipidemia (HCC) - Primary   Relevant Orders   CBC With Differential (Completed)   CMP14+EGFR (Completed)   Hemoglobin A1c (Completed)   CKD (chronic kidney disease), stage IIIa    Patient is seen by Nephrology, who manage this condition.  She is well controlled with current therapy.   Will defer to them for further changes to plan of care.       Type 2 diabetes mellitus with hyperglycemia, with long-term current use of insulin (HCC)    Checking labs today. Will call pt. With results  Continue current diabetes POC, as patient has been well controlled on current regimen.  Will adjust meds if needed based on labs.       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  Well controlled with current therapy.   Continue current meds.        Other Visit Diagnoses     Vitamin D deficiency, unspecified        Checking labs today.  Will continue supplements as needed.   Relevant Orders   VITAMIN D 25 Hydroxy (Vit-D Deficiency, Fractures) (Completed)   CBC With  Differential (Completed)   CMP14+EGFR (Completed)   B12 deficiency due to diet       Checking labs today.  Will continue supplements as needed.   Relevant Orders   CBC With Differential (Completed)   CMP14+EGFR (Completed)   Vitamin B12 (Completed)   Hypothyroidism (acquired)       Checking labs today.  will send meds if needed.   Relevant Orders   CBC With Differential (Completed)   CMP14+EGFR (Completed)   TSH (Completed)       Return in about 3 months (around 10/07/2022).   Total time spent: 30 minutes  Miki Kins, FNP  07/07/2022   This document may have been prepared by [facility] Voice Recognition software and as such may include unintentional dictation errors.

## 2022-07-11 ENCOUNTER — Encounter: Payer: Self-pay | Admitting: Family

## 2022-07-11 NOTE — Assessment & Plan Note (Signed)
Patient is seen by Nephrology, who manage this condition.  She is well controlled with current therapy.   Will defer to them for further changes to plan of care.  

## 2022-07-11 NOTE — Assessment & Plan Note (Signed)
Checking labs today. Will call pt. With results  Continue current diabetes POC, as patient has been well controlled on current regimen.  Will adjust meds if needed based on labs.  

## 2022-07-11 NOTE — Assessment & Plan Note (Signed)
Checking labs today.  Continue current therapy for lipid control. Will modify as needed based on labwork results.  

## 2022-07-11 NOTE — Assessment & Plan Note (Signed)
Blood pressure well controlled with current medications.  Continue current therapy.  Will reassess at follow up.  

## 2022-07-11 NOTE — Assessment & Plan Note (Signed)
Patient stable.  Well controlled with current therapy.   Continue current meds.  

## 2022-07-14 ENCOUNTER — Other Ambulatory Visit: Payer: Self-pay | Admitting: Unknown Physician Specialty

## 2022-07-14 DIAGNOSIS — E041 Nontoxic single thyroid nodule: Secondary | ICD-10-CM

## 2022-07-16 ENCOUNTER — Telehealth: Payer: Self-pay | Admitting: Family

## 2022-07-16 NOTE — Telephone Encounter (Signed)
Patient left VM wanting her recent lab results. Please advise.

## 2022-07-17 ENCOUNTER — Other Ambulatory Visit: Payer: Self-pay

## 2022-07-23 ENCOUNTER — Other Ambulatory Visit: Payer: Self-pay | Admitting: Family

## 2022-07-27 ENCOUNTER — Other Ambulatory Visit: Payer: Self-pay

## 2022-07-27 ENCOUNTER — Telehealth: Payer: Self-pay | Admitting: Family

## 2022-07-27 MED ORDER — HYDROCODONE-ACETAMINOPHEN 5-325 MG PO TABS: ORAL_TABLET | ORAL | 0 refills | Status: DC

## 2022-07-27 NOTE — Telephone Encounter (Signed)
Refill sent.

## 2022-08-25 ENCOUNTER — Ambulatory Visit (INDEPENDENT_AMBULATORY_CARE_PROVIDER_SITE_OTHER): Payer: Medicaid Other

## 2022-08-25 ENCOUNTER — Ambulatory Visit: Payer: Medicaid Other | Admitting: Family

## 2022-08-25 ENCOUNTER — Encounter: Payer: Self-pay | Admitting: Family

## 2022-08-25 VITALS — BP 110/60 | HR 80 | Ht 60.0 in | Wt 110.4 lb

## 2022-08-25 DIAGNOSIS — R0781 Pleurodynia: Secondary | ICD-10-CM

## 2022-08-25 MED ORDER — HYDROCODONE-ACETAMINOPHEN 5-325 MG PO TABS: ORAL_TABLET | ORAL | 0 refills | Status: DC

## 2022-08-25 MED ORDER — ONDANSETRON 4 MG PO TBDP: 4.0000 mg | ORAL_TABLET | Freq: Three times a day (TID) | ORAL | 1 refills | Status: DC | PRN

## 2022-08-25 NOTE — Progress Notes (Signed)
Established Patient Office Visit  Subjective:  Patient ID: Karen [name], female    DOB: 1955/11/18  Age: 67 y.o. MRN: [id number]  Chief Complaint  Patient presents with   Acute Visit    FALL    Had gone to the ED with a fever, says that she was there for a few hours, had COVID.  When she got home, says that she passed out and hit the floor.   Since then, she has been achy on her left side.  Hurts the most over her left lateral area of her chest wall.   Otherwise, is doing well, feels much better after her bout of COVID.  No other concerns at this time.   Past Medical History:  Diagnosis Date   Abdominal pain    Acute cystitis 05/18/2020   Acute kidney injury superimposed on CKD (HCC)    Acute on chronic pancreatitis (HCC) 01/03/2017   Acute renal insufficiency 10/29/2015   Adhesive capsulitis of left shoulder    Alcoholic pancreatitis    Asthma    Depression    Diabetes mellitus without complication (HCC)    Diabetic cataract of right eye (HCC) 08/31/2019   Formatting of this note might be different from the original. Added automatically from request for surgery [id number]   DKA (diabetic ketoacidoses) 02/17/2015   Elevated alkaline phosphatase level 10/30/2021   Emphysematous cystitis    Esophageal candidiasis (HCC)    ETOH abuse 01/03/2017   Headache 02/26/2019   Hypercholesteremia    Hypertension    Hypertensive urgency 04/21/2016   Hypokalemia    Left flank pain 10/29/2015   Left upper quadrant abdominal pain 05/18/2020   Leukocytosis 06/13/2019   Malignant essential hypertension 10/29/2015   Upper GI bleed 11/11/2017   UTI (urinary tract infection) 01/24/2018    Past Surgical History:  Procedure Laterality Date   ABDOMINAL HYSTERECTOMY  1996   APPENDECTOMY  1997   ESOPHAGOGASTRODUODENOSCOPY N/A 11/12/2017   Procedure: ESOPHAGOGASTRODUODENOSCOPY (EGD);  Surgeon: Toney Reil, MD;  Location: [facility] ENDOSCOPY;  Service: Gastroenterology;  Laterality: N/A;    ESOPHAGOGASTRODUODENOSCOPY N/A 09/29/2018   Procedure: ESOPHAGOGASTRODUODENOSCOPY (EGD);  Surgeon: Toledo, Boykin Nearing, MD;  Location: [facility] ENDOSCOPY;  Service: Gastroenterology;  Laterality: N/A;   ESOPHAGOGASTRODUODENOSCOPY (EGD) WITH PROPOFOL N/A 04/24/2016   Procedure: ESOPHAGOGASTRODUODENOSCOPY (EGD) WITH PROPOFOL;  Surgeon: Midge Minium, MD;  Location: [facility] ENDOSCOPY;  Service: Endoscopy;  Laterality: N/A;   ESOPHAGOGASTRODUODENOSCOPY (EGD) WITH PROPOFOL N/A 01/28/2019   Procedure: ESOPHAGOGASTRODUODENOSCOPY (EGD) WITH PROPOFOL;  Surgeon: Toledo, Boykin Nearing, MD;  Location: [facility] ENDOSCOPY;  Service: Gastroenterology;  Laterality: N/A;   EYE SURGERY     HAND SURGERY  1988   THYROID SURGERY  2013    Social History   Socioeconomic History   Marital status: Single    Spouse name: Not on file   Number of children: Not on file   Years of education: Not on file   Highest education level: Not on file  Occupational History   Occupation: disabled  Tobacco Use   Smoking status: Former   Smokeless tobacco: Never   Tobacco comments:    quit 7 years ago   Vaping Use   Vaping status: Never Used  Substance and Sexual Activity   Alcohol use: Yes    Comment: drank wine yesterday   Drug use: No   Sexual activity: Not on file  Other Topics Concern   Not on file  Social History Narrative   Not on file   Social Determinants of  Health   Financial Resource Strain: Low Risk  (10/31/2021)   Received from [facility]   Overall Financial Resource Strain (CARDIA)    Difficulty of Paying Living Expenses: Not hard at all  Food Insecurity: No Food Insecurity (10/31/2021)   Received from [facility], [facility]   Hunger Vital Sign    Worried About Running Out of Food in the Last Year: Never true    Ran Out of Food in the Last Year: Never true  Transportation Needs: Unmet Transportation Needs (10/31/2021)   Received from [facility], [facility]   PRAPARE -  Transportation    Lack of Transportation (Medical): Yes    Lack of Transportation (Non-Medical): Yes  Physical Activity: Not on file  Stress: Not on file  Social Connections: Not on file  Intimate Partner Violence: Not on file    Family History  Problem Relation Age of Onset   Diabetes Mother     Allergies  Allergen Reactions   Metoclopramide Other (See Comments) and Shortness Of Breath    Hypotension, shortness of breath  Hypotension, shortness of breath  Hypotension, shortness of breath  Hypotension, shortness of breath    Other Reaction(s): Other (See Comments)    Hypotension, shortness of breath  Hypotension, shortness of breath Hypotension, shortness of breath   Penicillins Anaphylaxis, Rash and Swelling    Has patient had a PCN reaction causing immediate rash, facial/tongue/throat swelling, SOB or lightheadedness with hypotension: Yes  Has patient had a PCN reaction causing severe rash involving mucus membranes or skin necrosis: No  Has patient had a PCN reaction that required hospitalization No  Has patient had a PCN reaction occurring within the last 10 years: No  If all of the above answers are "NO", then may proceed with Cephalosporin use.  Has patient had a PCN reaction causing immediate rash, facial/tongue/throat swelling, SOB or lightheadedness with hypotension: Yes  Has patient had a PCN reaction causing severe rash involving mucus membranes or skin necrosis: No  Has patient had a PCN reaction that required hospitalization No  Has patient had a PCN reaction occurring within the last 10 years: No  If all of the above answers are "NO", then may proceed with Cephalosporin use.  Has patient had a PCN reaction causing immediate rash, facial/tongue/throat swelling, SOB or lightheadedness with hypotension: Yes  Has patient had a PCN reaction causing severe rash involving mucus membranes or skin necrosis: No  Has patient had a PCN reaction that required  hospitalization No  Has patient had a PCN reaction occurring within the last 10 years: No  If all of the above answers are "NO", then may proceed with Cephalosporin use.  Has patient had a PCN reaction causing immediate rash, facial/tongue/throat swelling, SOB or lightheadedness with hypotension: Yes Has patient had a PCN reaction causing severe rash involving mucus membranes or skin necrosis: No Has patient had a PCN reaction that required hospitalization No Has patient had a PCN reaction occurring within the last 10 years: No If all of the above answers are "NO", then may proceed with Cephalosporin use.   Fentanyl Rash    Rash   Other Rash    Bee sting   Bee sting  Bee sting    Bee sting   Bee sting   Oxycodone-Acetaminophen Rash    plain tylenol can also make itch?  plain tylenol can also make itch?  plain tylenol can also make itch?  plain tylenol  can also make itch?    plain tylenol can also make itch?  plain tylenol can also make itch? plain tylenol can also make itch?   Percocet [Oxycodone-Acetaminophen] Rash    plain tylenol can also make itch?    Review of Systems  All other systems reviewed and are negative.      Objective:   BP 110/60   Pulse 80   Ht 5' (1.524 m)   Wt 110 lb 6.4 oz (50.1 kg)   SpO2 98%   BMI 21.56 kg/m   Vitals:   08/25/22 1013  BP: 110/60  Pulse: 80  Height: 5' (1.524 m)  Weight: 110 lb 6.4 oz (50.1 kg)  SpO2: 98%  BMI (Calculated): 21.56    Physical Exam Vitals and nursing note reviewed.  Constitutional:      Appearance: Normal appearance. She is normal weight.  HENT:     Head: Normocephalic.  Eyes:     Pupils: Pupils are equal, round, and reactive to light.  Cardiovascular:     Rate and Rhythm: Normal rate.  Pulmonary:     Effort: Pulmonary effort is normal.     Breath sounds: Normal breath sounds.  Neurological:     General: No focal deficit present.     Mental Status: She is alert and oriented to person, place, and  time. Mental status is at baseline.  Psychiatric:        Mood and Affect: Mood normal.        Behavior: Behavior normal.        Thought Content: Thought content normal.        Judgment: Judgment normal.      No results found for any visits on 08/25/22.  Recent Results (from the past 2160 hour(s))  Lipid panel     Status: None   Collection Time: 07/07/22  3:40 PM  Result Value Ref Range   Cholesterol, Total 140 100 - 199 mg/dL   Triglycerides 161 0 - 149 mg/dL   HDL 63 >09 mg/dL   VLDL Cholesterol Cal 25 5 - 40 mg/dL   LDL Chol Calc (NIH) 52 0 - 99 mg/dL   Chol/HDL Ratio 2.2 0.0 - 4.4 ratio    Comment:                                   T. Chol/HDL Ratio                                             Men  Women                               1/2 Avg.Risk  3.4    3.3                                   Avg.Risk  5.0    4.4                                2X Avg.Risk  9.6    7.1  3X Avg.Risk 23.4   11.0   VITAMIN D 25 Hydroxy (Vit-D Deficiency, Fractures)     Status: Abnormal   Collection Time: 07/07/22  3:40 PM  Result Value Ref Range   Vit D, 25-Hydroxy 27.9 (L) 30.0 - 100.0 ng/mL    Comment: Vitamin D deficiency has been defined by the Institute of Medicine and an Endocrine Society practice guideline as a level of serum 25-OH vitamin D less than 20 ng/mL (1,2). The Endocrine Society went on to further define vitamin D insufficiency as a level between 21 and 29 ng/mL (2). 1. IOM (Institute of Medicine). 2010. Dietary reference    intakes for calcium and D. [place]: The    [employer]. 2. Holick MF, Binkley Franklin, Bischoff-Ferrari HA, et al.    Evaluation, treatment, and prevention of vitamin D    deficiency: an Endocrine Society clinical practice    guideline. JCEM. 2011 Jul; 96(7):1911-30.   CBC With Differential     Status: Abnormal   Collection Time: 07/07/22  3:40 PM  Result Value Ref Range   WBC 8.8 3.4 - 10.8 x10E3/uL   RBC  3.75 (L) 3.77 - 5.28 x10E6/uL   Hemoglobin 10.6 (L) 11.1 - 15.9 g/dL   Hematocrit 22.0 (L) 25.4 - 46.6 %   MCV 88 79 - 97 fL   MCH 28.3 26.6 - 33.0 pg   MCHC 32.1 31.5 - 35.7 g/dL   RDW 27.0 62.3 - 76.2 %   Neutrophils 54 Not Estab. %   Lymphs 33 Not Estab. %   Monocytes 8 Not Estab. %   Eos 3 Not Estab. %   Basos 1 Not Estab. %   Neutrophils Absolute 4.9 1.4 - 7.0 x10E3/uL   Lymphocytes Absolute 2.9 0.7 - 3.1 x10E3/uL   Monocytes Absolute 0.7 0.1 - 0.9 x10E3/uL   EOS (ABSOLUTE) 0.2 0.0 - 0.4 x10E3/uL   Basophils Absolute 0.1 0.0 - 0.2 x10E3/uL   Immature Granulocytes 1 Not Estab. %   Immature Grans (Abs) 0.1 0.0 - 0.1 x10E3/uL    Comment: **Effective August 03, 2022, profile [id number] CBC/Differential**   (No Platelet) will be made non-orderable. [facility] Offers:   [id number] CBC With Differential/Platelet   CMP14+EGFR     Status: Abnormal   Collection Time: 07/07/22  3:40 PM  Result Value Ref Range   Glucose 138 (H) 70 - 99 mg/dL   BUN 31 (H) 8 - 27 mg/dL   Creatinine, Ser 6.16 (H) 0.57 - 1.00 mg/dL   eGFR 27 (L) >07 PX/TGG/2.69   BUN/Creatinine Ratio 16 12 - 28   Sodium 138 134 - 144 mmol/L   Potassium 5.0 3.5 - 5.2 mmol/L   Chloride 97 96 - 106 mmol/L   CO2 24 20 - 29 mmol/L   Calcium 9.4 8.7 - 10.3 mg/dL   Total Protein 7.1 6.0 - 8.5 g/dL   Albumin 4.0 3.9 - 4.9 g/dL   Globulin, Total 3.1 1.5 - 4.5 g/dL   Bilirubin Total 0.3 0.0 - 1.2 mg/dL   Alkaline Phosphatase 458 (H) 44 - 121 IU/L   AST 46 (H) 0 - 40 IU/L   ALT 45 (H) 0 - 32 IU/L  TSH     Status: None   Collection Time: 07/07/22  3:40 PM  Result Value Ref Range   TSH 4.320 0.450 - 4.500 uIU/mL  Hemoglobin A1c     Status: Abnormal   Collection Time: 07/07/22  3:40 PM  Result Value Ref Range   Hgb A1c  MFr Bld 8.3 (H) 4.8 - 5.6 %    Comment:          Prediabetes: 5.7 - 6.4          Diabetes: >6.4          Glycemic control for adults with diabetes: <7.0    Est. average glucose Bld gHb Est-mCnc 192 mg/dL  Vitamin M57      Status: None   Collection Time: 07/07/22  3:40 PM  Result Value Ref Range   Vitamin B-12 732 232 - 1,245 pg/mL       Assessment & Plan:   Problem List Items Addressed This Visit   None Visit Diagnoses     Rib pain on left side    -  Primary   getting x-ray on left ribs. Will send refills for her meds Continue other therapy.   Relevant Orders   DG Ribs Unilateral Left       Return as previously scheduled unless not improving..   Total time spent: 20 minutes  Miki Kins, FNP  08/25/2022   This document may have been prepared by [facility] Voice Recognition software and as such may include unintentional dictation errors.

## 2022-08-27 ENCOUNTER — Other Ambulatory Visit: Payer: Self-pay | Admitting: Family

## 2022-09-18 ENCOUNTER — Other Ambulatory Visit: Payer: Self-pay

## 2022-09-18 ENCOUNTER — Emergency Department: Payer: Medicaid Other

## 2022-09-18 ENCOUNTER — Emergency Department
Admission: EM | Admit: 2022-09-18 | Discharge: 2022-09-18 | Disposition: A | Discharge status: Home / Self Care | Payer: Medicaid Other | Attending: Emergency Medicine | Admitting: Emergency Medicine

## 2022-09-18 DIAGNOSIS — G8929 Other chronic pain: Secondary | ICD-10-CM | POA: Diagnosis not present

## 2022-09-18 DIAGNOSIS — E1122 Type 2 diabetes mellitus with diabetic chronic kidney disease: Secondary | ICD-10-CM | POA: Diagnosis not present

## 2022-09-18 DIAGNOSIS — R1012 Left upper quadrant pain: Secondary | ICD-10-CM | POA: Diagnosis not present

## 2022-09-18 DIAGNOSIS — N189 Chronic kidney disease, unspecified: Secondary | ICD-10-CM | POA: Diagnosis not present

## 2022-09-18 DIAGNOSIS — R1013 Epigastric pain: Secondary | ICD-10-CM | POA: Diagnosis present

## 2022-09-18 LAB — CBC
HCT: 34.9 % — ABNORMAL LOW (ref 36.0–46.0)
Hemoglobin: 11.7 g/dL — ABNORMAL LOW (ref 12.0–15.0)
MCH: 29.3 pg (ref 26.0–34.0)
MCHC: 33.5 g/dL (ref 30.0–36.0)
MCV: 87.3 fL (ref 80.0–100.0)
Platelets: 278 10*3/uL (ref 150–400)
RBC: 4 MIL/uL (ref 3.87–5.11)
RDW: 12.7 % (ref 11.5–15.5)
WBC: 7.1 10*3/uL (ref 4.0–10.5)
nRBC: 0 % (ref 0.0–0.2)

## 2022-09-18 LAB — COMPREHENSIVE METABOLIC PANEL
ALT: 35 U/L (ref 0–44)
AST: 47 U/L — ABNORMAL HIGH (ref 15–41)
Albumin: 3.7 g/dL (ref 3.5–5.0)
Alkaline Phosphatase: 286 U/L — ABNORMAL HIGH (ref 38–126)
Anion gap: 12 (ref 5–15)
BUN: 41 mg/dL — ABNORMAL HIGH (ref 8–23)
CO2: 24 mmol/L (ref 22–32)
Calcium: 9 mg/dL (ref 8.9–10.3)
Chloride: 103 mmol/L (ref 98–111)
Creatinine, Ser: 1.95 mg/dL — ABNORMAL HIGH (ref 0.44–1.00)
GFR, Estimated: 28 mL/min — ABNORMAL LOW (ref >60)
Glucose, Bld: 122 mg/dL — ABNORMAL HIGH (ref 70–99)
Potassium: 4.7 mmol/L (ref 3.5–5.1)
Sodium: 139 mmol/L (ref 135–145)
Total Bilirubin: 0.8 mg/dL (ref 0.3–1.2)
Total Protein: 7.7 g/dL (ref 6.5–8.1)

## 2022-09-18 LAB — URINALYSIS, ROUTINE W REFLEX MICROSCOPIC
Bilirubin Urine: NEGATIVE
Glucose, UA: NEGATIVE mg/dL
Hgb urine dipstick: NEGATIVE
Ketones, ur: NEGATIVE mg/dL
Nitrite: NEGATIVE
Protein, ur: 100 mg/dL — AB
Specific Gravity, Urine: 1.01 (ref 1.005–1.030)
pH: 7 (ref 5.0–8.0)

## 2022-09-18 LAB — LIPASE, BLOOD: Lipase: 77 U/L — ABNORMAL HIGH (ref 11–51)

## 2022-09-18 MED ORDER — LORATADINE 10 MG PO TABS
10.0000 mg | ORAL_TABLET | Freq: Once | ORAL | Status: AC
  Administered 2022-09-18: 10 mg via ORAL
  Filled 2022-09-18: qty 1

## 2022-09-18 MED ORDER — HYDROCODONE-ACETAMINOPHEN 5-325 MG PO TABS
1.5000 | ORAL_TABLET | Freq: Once | ORAL | Status: AC
  Administered 2022-09-18: 1.5 via ORAL
  Filled 2022-09-18: qty 2

## 2022-09-18 NOTE — ED Triage Notes (Addendum)
Pt comes with c/o belly pain for few days now. Pt states hx of pancreatitis and currently out of pain meds. Pt states pain has gotten worse. Pt states some nausea but no vomiting.   Pt states her belly has been swollen. Pt also states some diarrhea.
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
## 2022-09-18 NOTE — Discharge Instructions (Addendum)
? ?  Please return to the emergency room right away if you are to develop a fever, severe nausea, your pain becomes severe or worsens, you are unable to keep food down, begin vomiting any dark or bloody fluid, you develop any dark or bloody stools, feel dehydrated, or other new concerns or symptoms arise. ? ?

## 2022-09-20 LAB — URINE CULTURE: Culture: >=100000 — AB

## 2022-09-22 ENCOUNTER — Ambulatory Visit (INDEPENDENT_AMBULATORY_CARE_PROVIDER_SITE_OTHER): Payer: Medicaid Other | Admitting: Family

## 2022-09-22 ENCOUNTER — Other Ambulatory Visit: Payer: Self-pay | Admitting: Family

## 2022-09-22 ENCOUNTER — Encounter: Payer: Self-pay | Admitting: Family

## 2022-09-22 VITALS — BP 119/70 | HR 82 | Ht 60.0 in | Wt 114.6 lb

## 2022-09-22 DIAGNOSIS — E1169 Type 2 diabetes mellitus with other specified complication: Secondary | ICD-10-CM

## 2022-09-22 DIAGNOSIS — K860 Alcohol-induced chronic pancreatitis: Secondary | ICD-10-CM | POA: Diagnosis not present

## 2022-09-22 DIAGNOSIS — F1020 Alcohol dependence, uncomplicated: Secondary | ICD-10-CM

## 2022-09-22 DIAGNOSIS — G8929 Other chronic pain: Secondary | ICD-10-CM

## 2022-09-22 DIAGNOSIS — R109 Unspecified abdominal pain: Secondary | ICD-10-CM

## 2022-09-22 DIAGNOSIS — E785 Hyperlipidemia, unspecified: Secondary | ICD-10-CM | POA: Diagnosis not present

## 2022-09-22 LAB — POCT CBG (FASTING - GLUCOSE)-MANUAL ENTRY: Glucose Fasting, POC: 192 mg/dL — AB (ref 70–99)

## 2022-09-22 MED ORDER — HYDROCODONE-ACETAMINOPHEN 5-325 MG PO TABS: ORAL_TABLET | ORAL | 0 refills | Status: DC
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
## 2022-10-05 ENCOUNTER — Other Ambulatory Visit: Payer: Self-pay | Admitting: Family

## 2022-10-07 ENCOUNTER — Ambulatory Visit: Payer: Medicaid Other | Admitting: Family

## 2022-10-14 ENCOUNTER — Other Ambulatory Visit (INDEPENDENT_AMBULATORY_CARE_PROVIDER_SITE_OTHER): Payer: Medicaid Other

## 2022-10-14 ENCOUNTER — Encounter: Payer: Self-pay | Admitting: Family

## 2022-10-14 ENCOUNTER — Ambulatory Visit: Payer: Medicaid Other | Admitting: Family

## 2022-10-14 VITALS — BP 128/82 | HR 81 | Ht 61.0 in | Wt 112.0 lb

## 2022-10-14 DIAGNOSIS — I10 Essential (primary) hypertension: Secondary | ICD-10-CM

## 2022-10-14 DIAGNOSIS — F1020 Alcohol dependence, uncomplicated: Secondary | ICD-10-CM

## 2022-10-14 DIAGNOSIS — E1165 Type 2 diabetes mellitus with hyperglycemia: Secondary | ICD-10-CM

## 2022-10-14 DIAGNOSIS — E559 Vitamin D deficiency, unspecified: Secondary | ICD-10-CM

## 2022-10-14 DIAGNOSIS — Z794 Long term (current) use of insulin: Secondary | ICD-10-CM

## 2022-10-14 DIAGNOSIS — N1832 Chronic kidney disease, stage 3b: Secondary | ICD-10-CM

## 2022-10-14 DIAGNOSIS — E538 Deficiency of other specified B group vitamins: Secondary | ICD-10-CM

## 2022-10-14 DIAGNOSIS — E1169 Type 2 diabetes mellitus with other specified complication: Secondary | ICD-10-CM

## 2022-10-14 DIAGNOSIS — K859 Acute pancreatitis without necrosis or infection, unspecified: Secondary | ICD-10-CM

## 2022-10-14 DIAGNOSIS — Z23 Encounter for immunization: Secondary | ICD-10-CM | POA: Diagnosis not present

## 2022-10-14 DIAGNOSIS — K860 Alcohol-induced chronic pancreatitis: Secondary | ICD-10-CM | POA: Diagnosis not present

## 2022-10-14 DIAGNOSIS — E785 Hyperlipidemia, unspecified: Secondary | ICD-10-CM | POA: Diagnosis not present

## 2022-10-14 LAB — POCT CBG (FASTING - GLUCOSE)-MANUAL ENTRY: Glucose Fasting, POC: 128 mg/dL — AB (ref 70–99)

## 2022-10-14 MED ORDER — FAMOTIDINE 20 MG PO TABS: 20.0000 mg | ORAL_TABLET | Freq: Two times a day (BID) | ORAL | 0 refills | Status: DC

## 2022-10-15 LAB — VITAMIN B12: Vitamin B-12: 625 pg/mL (ref 232–1245)

## 2022-10-15 LAB — LIPID PANEL
Chol/HDL Ratio: 2.7 {ratio} (ref 0.0–4.4)
Cholesterol, Total: 177 mg/dL (ref 100–199)
HDL: 66 mg/dL (ref >39)
LDL Chol Calc (NIH): 91 mg/dL (ref 0–99)
Triglycerides: 112 mg/dL (ref 0–149)
VLDL Cholesterol Cal: 20 mg/dL (ref 5–40)

## 2022-10-15 LAB — CMP14+EGFR
ALT: 40 [IU]/L — ABNORMAL HIGH (ref 0–32)
AST: 52 [IU]/L — ABNORMAL HIGH (ref 0–40)
Albumin: 4.4 g/dL (ref 3.9–4.9)
Alkaline Phosphatase: 338 [IU]/L — ABNORMAL HIGH (ref 44–121)
BUN/Creatinine Ratio: 20 (ref 12–28)
BUN: 43 mg/dL — ABNORMAL HIGH (ref 8–27)
Bilirubin Total: 0.2 mg/dL (ref 0.0–1.2)
CO2: 23 mmol/L (ref 20–29)
Calcium: 9.1 mg/dL (ref 8.7–10.3)
Chloride: 102 mmol/L (ref 96–106)
Creatinine, Ser: 2.14 mg/dL — ABNORMAL HIGH (ref 0.57–1.00)
Globulin, Total: 2.6 g/dL (ref 1.5–4.5)
Glucose: 91 mg/dL (ref 70–99)
Potassium: 4.9 mmol/L (ref 3.5–5.2)
Sodium: 140 mmol/L (ref 134–144)
Total Protein: 7 g/dL (ref 6.0–8.5)
eGFR: 25 mL/min/{1.73_m2} — ABNORMAL LOW (ref >59)

## 2022-10-15 LAB — VITAMIN D 25 HYDROXY (VIT D DEFICIENCY, FRACTURES): Vit D, 25-Hydroxy: 52.8 ng/mL (ref 30.0–100.0)

## 2022-10-15 LAB — HEMOGLOBIN A1C
Est. average glucose Bld gHb Est-mCnc: 180 mg/dL
Hgb A1c MFr Bld: 7.9 % — ABNORMAL HIGH (ref 4.8–5.6)

## 2022-10-27 ENCOUNTER — Emergency Department: Payer: Medicaid Other

## 2022-10-27 ENCOUNTER — Emergency Department
Admission: EM | Admit: 2022-10-27 | Discharge: 2022-10-27 | Discharge status: Against Medical Advice | Payer: Medicaid Other | Attending: Emergency Medicine | Admitting: Emergency Medicine

## 2022-10-27 ENCOUNTER — Other Ambulatory Visit: Payer: Self-pay

## 2022-10-27 DIAGNOSIS — R531 Weakness: Secondary | ICD-10-CM | POA: Insufficient documentation

## 2022-10-27 DIAGNOSIS — Z5321 Procedure and treatment not carried out due to patient leaving prior to being seen by health care provider: Secondary | ICD-10-CM | POA: Diagnosis not present

## 2022-10-27 DIAGNOSIS — R42 Dizziness and giddiness: Secondary | ICD-10-CM | POA: Diagnosis present

## 2022-10-27 LAB — CBC
HCT: 38.7 % (ref 36.0–46.0)
Hemoglobin: 13.2 g/dL (ref 12.0–15.0)
MCH: 29.3 pg (ref 26.0–34.0)
MCHC: 34.1 g/dL (ref 30.0–36.0)
MCV: 85.8 fL (ref 80.0–100.0)
Platelets: 262 10*3/uL (ref 150–400)
RBC: 4.51 MIL/uL (ref 3.87–5.11)
RDW: 12.2 % (ref 11.5–15.5)
WBC: 9.5 10*3/uL (ref 4.0–10.5)
nRBC: 0 % (ref 0.0–0.2)

## 2022-10-27 LAB — BASIC METABOLIC PANEL
Anion gap: 12 (ref 5–15)
BUN: 39 mg/dL — ABNORMAL HIGH (ref 8–23)
CO2: 24 mmol/L (ref 22–32)
Calcium: 9.4 mg/dL (ref 8.9–10.3)
Chloride: 102 mmol/L (ref 98–111)
Creatinine, Ser: 1.66 mg/dL — ABNORMAL HIGH (ref 0.44–1.00)
GFR, Estimated: 34 mL/min — ABNORMAL LOW (ref >60)
Glucose, Bld: 135 mg/dL — ABNORMAL HIGH (ref 70–99)
Potassium: 3.4 mmol/L — ABNORMAL LOW (ref 3.5–5.1)
Sodium: 138 mmol/L (ref 135–145)

## 2022-10-27 LAB — CBG MONITORING, ED: Glucose-Capillary: 125 mg/dL — ABNORMAL HIGH (ref 70–99)

## 2022-10-27 LAB — LIPASE, BLOOD: Lipase: 378 U/L — ABNORMAL HIGH (ref 11–51)

## 2022-10-27 NOTE — ED Triage Notes (Signed)
Pt here with dizziness and weakness x2 days. Pt also states she has chills and upper abd pain. Pt states her cbg is low despite it being in the 120. Pt endorses diarrhea and headache.

## 2022-10-27 NOTE — ED Provider Triage Note (Signed)
Emergency Medicine Provider Triage Evaluation Note  Karen [name] , a 67 y.o. female  was evaluated in triage.  Pt complains of weakness and dizziness.  History of pancreatitis..  Review of Systems  Positive:  Negative:   Physical Exam  BP (!) 145/72   Pulse 92   Temp 98.6 F (37 C) (Oral)   Resp 18   Ht 5\' 1"  (1.549 m)   Wt 50.8 kg   SpO2 100%   BMI 21.16 kg/m  Gen:   Awake, no distress   Resp:  Normal effort  MSK:   Moves extremities without difficulty  Other:    Medical Decision Making  Medically screening exam initiated at 1:55 PM.  Appropriate orders placed.  Tori Milks was informed that the remainder of the evaluation will be completed by another provider, this initial triage assessment does not replace that evaluation, and the importance of remaining in the ED until their evaluation is complete.     Faythe Ghee, PA-C 10/27/22 1355

## 2022-10-27 NOTE — ED Triage Notes (Signed)
Arrives from home via [facility] c/O flu like symptoms for past 2 days and today15-30 minutes of dizziness.  Also C/O LLQ abd pain, hx pancreatitis.  VSS CBG:  119

## 2022-11-14 ENCOUNTER — Other Ambulatory Visit: Payer: Self-pay | Admitting: Family

## 2022-11-19 ENCOUNTER — Other Ambulatory Visit: Payer: Self-pay | Admitting: Family

## 2022-11-20 ENCOUNTER — Telehealth: Payer: Self-pay

## 2022-11-20 NOTE — Telephone Encounter (Signed)
Patient called wanting refill on Hydrcodone. [facility], [place].

## 2022-11-20 NOTE — Telephone Encounter (Signed)
Pt called requesting a refill on her pain medication-HQ

## 2022-11-28 ENCOUNTER — Other Ambulatory Visit: Payer: Self-pay | Admitting: Family

## 2022-11-29 ENCOUNTER — Encounter: Payer: Self-pay | Admitting: Family

## 2022-11-29 NOTE — Assessment & Plan Note (Signed)
Patient stable.  Well controlled with current therapy.   Continue current meds.  

## 2022-11-29 NOTE — Assessment & Plan Note (Signed)
Checking labs today. Will call pt. With results  Continue current diabetes POC, as patient has been well controlled on current regimen.  Will adjust meds if needed based on labs.  

## 2022-11-29 NOTE — Assessment & Plan Note (Signed)
Checking labs today Patient has been referred to Nephrology.

## 2022-11-29 NOTE — Assessment & Plan Note (Signed)
Checking labs today.  Continue current therapy for lipid control. Will modify as needed based on labwork results.

## 2022-11-29 NOTE — Assessment & Plan Note (Signed)
Checking labs today.  Continue current therapy for lipid control. Will modify as needed based on labwork results.  

## 2022-11-29 NOTE — Progress Notes (Signed)
Established Patient Office Visit  Subjective:  Patient ID: Karen [name], female    DOB: Mar 27, 1955  Age: 67 y.o. MRN: [id number]  Chief Complaint  Patient presents with   Follow-up    Patient is here today for her 3 months follow up.  She has been feeling fairly well since last appointment.   She does not have additional concerns to discuss today.  Labs are due today. She needs refills.   I have reviewed her active problem list, medication list, allergies, health maintenance, notes from last encounter, lab results for her appointment today.      No other concerns at this time.   Past Medical History:  Diagnosis Date   Abdominal pain    Acute cystitis 05/18/2020   Acute kidney injury superimposed on CKD (HCC)    Acute on chronic pancreatitis (HCC) 01/03/2017   Acute renal insufficiency 10/29/2015   Adhesive capsulitis of left shoulder    Alcoholic pancreatitis    Asthma    Depression    Diabetes mellitus without complication (HCC)    Diabetic cataract of right eye (HCC) 08/31/2019   Formatting of this note might be different from the original. Added automatically from request for surgery [id number]   DKA (diabetic ketoacidoses) 02/17/2015   DKA (diabetic ketoacidosis) (HCC) 02/17/2015   IMO SNOMED Dx Update Oct 2024     Elevated alkaline phosphatase level 10/30/2021   Emphysematous cystitis    Esophageal candidiasis (HCC)    ETOH abuse 01/03/2017   Headache 02/26/2019   Hypercholesteremia    Hypertension    Hypertensive urgency 04/21/2016   Hypokalemia    Left flank pain 10/29/2015   Left upper quadrant abdominal pain 05/18/2020   Leukocytosis 06/13/2019   Malignant essential hypertension 10/29/2015   Upper GI bleed 11/11/2017   UTI (urinary tract infection) 01/24/2018    Past Surgical History:  Procedure Laterality Date   ABDOMINAL HYSTERECTOMY  1996   APPENDECTOMY  1997   ESOPHAGOGASTRODUODENOSCOPY N/A 11/12/2017   Procedure: ESOPHAGOGASTRODUODENOSCOPY  (EGD);  Surgeon: Toney Reil, MD;  Location: [facility] ENDOSCOPY;  Service: Gastroenterology;  Laterality: N/A;   ESOPHAGOGASTRODUODENOSCOPY N/A 09/29/2018   Procedure: ESOPHAGOGASTRODUODENOSCOPY (EGD);  Surgeon: Toledo, Boykin Nearing, MD;  Location: [facility] ENDOSCOPY;  Service: Gastroenterology;  Laterality: N/A;   ESOPHAGOGASTRODUODENOSCOPY (EGD) WITH PROPOFOL N/A 04/24/2016   Procedure: ESOPHAGOGASTRODUODENOSCOPY (EGD) WITH PROPOFOL;  Surgeon: Midge Minium, MD;  Location: [facility] ENDOSCOPY;  Service: Endoscopy;  Laterality: N/A;   ESOPHAGOGASTRODUODENOSCOPY (EGD) WITH PROPOFOL N/A 01/28/2019   Procedure: ESOPHAGOGASTRODUODENOSCOPY (EGD) WITH PROPOFOL;  Surgeon: Toledo, Boykin Nearing, MD;  Location: [facility] ENDOSCOPY;  Service: Gastroenterology;  Laterality: N/A;   EYE SURGERY     HAND SURGERY  1988   THYROID SURGERY  2013    Social History   Socioeconomic History   Marital status: Single    Spouse name: Not on file   Number of children: Not on file   Years of education: Not on file   Highest education level: Not on file  Occupational History   Occupation: disabled  Tobacco Use   Smoking status: Former   Smokeless tobacco: Never   Tobacco comments:    quit 7 years ago   Vaping Use   Vaping status: Never Used  Substance and Sexual Activity   Alcohol use: Yes    Comment: drank wine yesterday   Drug use: No   Sexual activity: Not on file  Other Topics Concern   Not on file  Social History Narrative   Not  on file   Social Determinants of Health   Financial Resource Strain: Low Risk  (10/31/2021)   Received from [facility], [facility]   Overall Financial Resource Strain (CARDIA)    Difficulty of Paying Living Expenses: Not hard at all  Food Insecurity: No Food Insecurity (10/31/2021)   Received from [facility], [facility]   Hunger Vital Sign    Worried About Running Out of Food in the Last Year: Never true    Ran Out of Food in the Last Year: Never true   Transportation Needs: Unmet Transportation Needs (10/31/2021)   Received from [facility], [facility]   PRAPARE - Transportation    Lack of Transportation (Medical): Yes    Lack of Transportation (Non-Medical): Yes  Physical Activity: Not on file  Stress: Not on file  Social Connections: Not on file  Intimate Partner Violence: Not on file    Family History  Problem Relation Age of Onset   Diabetes Mother     Allergies  Allergen Reactions   Metoclopramide Other (See Comments) and Shortness Of Breath    Hypotension, shortness of breath  Hypotension, shortness of breath  Hypotension, shortness of breath  Hypotension, shortness of breath    Other Reaction(s): Other (See Comments)    Hypotension, shortness of breath  Hypotension, shortness of breath Hypotension, shortness of breath   Penicillins Anaphylaxis, Rash and Swelling    Has patient had a PCN reaction causing immediate rash, facial/tongue/throat swelling, SOB or lightheadedness with hypotension: Yes  Has patient had a PCN reaction causing severe rash involving mucus membranes or skin necrosis: No  Has patient had a PCN reaction that required hospitalization No  Has patient had a PCN reaction occurring within the last 10 years: No  If all of the above answers are "NO", then may proceed with Cephalosporin use.  Has patient had a PCN reaction causing immediate rash, facial/tongue/throat swelling, SOB or lightheadedness with hypotension: Yes  Has patient had a PCN reaction causing severe rash involving mucus membranes or skin necrosis: No  Has patient had a PCN reaction that required hospitalization No  Has patient had a PCN reaction occurring within the last 10 years: No  If all of the above answers are "NO", then may proceed with Cephalosporin use.  Has patient had a PCN reaction causing immediate rash, facial/tongue/throat swelling, SOB or lightheadedness with hypotension: Yes  Has patient had a PCN reaction  causing severe rash involving mucus membranes or skin necrosis: No  Has patient had a PCN reaction that required hospitalization No  Has patient had a PCN reaction occurring within the last 10 years: No  If all of the above answers are "NO", then may proceed with Cephalosporin use.  Has patient had a PCN reaction causing immediate rash, facial/tongue/throat swelling, SOB or lightheadedness with hypotension: Yes Has patient had a PCN reaction causing severe rash involving mucus membranes or skin necrosis: No Has patient had a PCN reaction that required hospitalization No Has patient had a PCN reaction occurring within the last 10 years: No If all of the above answers are "NO", then may proceed with Cephalosporin use.   Fentanyl Rash    Rash   Other Rash    Bee sting   Bee sting  Bee sting    Bee sting   Bee sting   Oxycodone-Acetaminophen Rash    plain tylenol can also make itch?  plain tylenol can also make itch?  plain tylenol  can also make itch?  plain tylenol can also make itch?    plain tylenol can also make itch?  plain tylenol can also make itch? plain tylenol can also make itch?   Percocet [Oxycodone-Acetaminophen] Rash    plain tylenol can also make itch?    Review of Systems  Gastrointestinal:  Positive for abdominal pain, nausea and vomiting.  All other systems reviewed and are negative.      Objective:   BP 128/82   Pulse 81   Ht 5\' 1"  (1.549 m)   Wt 112 lb (50.8 kg)   SpO2 99%   BMI 21.16 kg/m   Vitals:   10/14/22 1117  BP: 128/82  Pulse: 81  Height: 5\' 1"  (1.549 m)  Weight: 112 lb (50.8 kg)  SpO2: 99%  BMI (Calculated): 21.17    Physical Exam Vitals and nursing note reviewed.  Constitutional:      Appearance: Normal appearance. She is normal weight.  HENT:     Head: Normocephalic.  Eyes:     Extraocular Movements: Extraocular movements intact.     Conjunctiva/sclera: Conjunctivae normal.     Pupils: Pupils are equal, round, and reactive to  light.  Cardiovascular:     Rate and Rhythm: Normal rate.  Pulmonary:     Effort: Pulmonary effort is normal.  Abdominal:     Tenderness: There is abdominal tenderness.  Musculoskeletal:        General: Normal range of motion.  Neurological:     General: No focal deficit present.     Mental Status: She is alert and oriented to person, place, and time. Mental status is at baseline.  Psychiatric:        Mood and Affect: Mood normal.        Behavior: Behavior normal.        Thought Content: Thought content normal.        Judgment: Judgment normal.      Results for orders placed or performed in visit on 10/14/22  Lipid panel  Result Value Ref Range   Cholesterol, Total 177 100 - 199 mg/dL   Triglycerides 440 0 - 149 mg/dL   HDL 66 >10 mg/dL   VLDL Cholesterol Cal 20 5 - 40 mg/dL   LDL Chol Calc (NIH) 91 0 - 99 mg/dL   Chol/HDL Ratio 2.7 0.0 - 4.4 ratio  VITAMIN D 25 Hydroxy (Vit-D Deficiency, Fractures)  Result Value Ref Range   Vit D, 25-Hydroxy 52.8 30.0 - 100.0 ng/mL  CMP14+EGFR  Result Value Ref Range   Glucose 91 70 - 99 mg/dL   BUN 43 (H) 8 - 27 mg/dL   Creatinine, Ser 2.72 (H) 0.57 - 1.00 mg/dL   eGFR 25 (L) >53 GU/YQI/3.47   BUN/Creatinine Ratio 20 12 - 28   Sodium 140 134 - 144 mmol/L   Potassium 4.9 3.5 - 5.2 mmol/L   Chloride 102 96 - 106 mmol/L   CO2 23 20 - 29 mmol/L   Calcium 9.1 8.7 - 10.3 mg/dL   Total Protein 7.0 6.0 - 8.5 g/dL   Albumin 4.4 3.9 - 4.9 g/dL   Globulin, Total 2.6 1.5 - 4.5 g/dL   Bilirubin Total 0.2 0.0 - 1.2 mg/dL   Alkaline Phosphatase 338 (H) 44 - 121 IU/L   AST 52 (H) 0 - 40 IU/L   ALT 40 (H) 0 - 32 IU/L  Hemoglobin A1c  Result Value Ref Range   Hgb A1c MFr Bld 7.9 (H) 4.8 - 5.6 %  Est. average glucose Bld gHb Est-mCnc 180 mg/dL  Vitamin G95  Result Value Ref Range   Vitamin B-12 625 232 - 1,245 pg/mL  POCT CBG (Fasting - Glucose)  Result Value Ref Range   Glucose Fasting, POC 128 (A) 70 - 99 mg/dL    Recent Results  (from the past 2160 hour(s))  Lipase, blood     Status: Abnormal   Collection Time: 09/18/22  7:14 AM  Result Value Ref Range   Lipase 77 (H) 11 - 51 U/L    Comment: Performed at [facility], [address] [place], [place] [address]  Comprehensive metabolic panel     Status: Abnormal   Collection Time: 09/18/22  7:14 AM  Result Value Ref Range   Sodium 139 135 - 145 mmol/L   Potassium 4.7 3.5 - 5.1 mmol/L    Comment: HEMOLYSIS AT THIS LEVEL MAY AFFECT RESULT   Chloride 103 98 - 111 mmol/L   CO2 24 22 - 32 mmol/L   Glucose, Bld 122 (H) 70 - 99 mg/dL    Comment: Glucose reference range applies only to samples taken after fasting for at least 8 hours.   BUN 41 (H) 8 - 23 mg/dL   Creatinine, Ser 8.65 (H) 0.44 - 1.00 mg/dL   Calcium 9.0 8.9 - 78.4 mg/dL   Total Protein 7.7 6.5 - 8.1 g/dL   Albumin 3.7 3.5 - 5.0 g/dL   AST 47 (H) 15 - 41 U/L    Comment: HEMOLYSIS AT THIS LEVEL MAY AFFECT RESULT   ALT 35 0 - 44 U/L    Comment: HEMOLYSIS AT THIS LEVEL MAY AFFECT RESULT   Alkaline Phosphatase 286 (H) 38 - 126 U/L   Total Bilirubin 0.8 0.3 - 1.2 mg/dL    Comment: HEMOLYSIS AT THIS LEVEL MAY AFFECT RESULT   GFR, Estimated 28 (L) >60 mL/min    Comment: (NOTE) Calculated using the CKD-EPI Creatinine Equation (2021)    Anion gap 12 5 - 15    Comment: Performed at [facility], [address] [place], [place] [address]  CBC     Status: Abnormal   Collection Time: 09/18/22  7:14 AM  Result Value Ref Range   WBC 7.1 4.0 - 10.5 K/uL   RBC 4.00 3.87 - 5.11 MIL/uL   Hemoglobin 11.7 (L) 12.0 - 15.0 g/dL   HCT 52.8 (L) 41.3 - 24.4 %   MCV 87.3 80.0 - 100.0 fL   MCH 29.3 26.0 - 34.0 pg   MCHC 33.5 30.0 - 36.0 g/dL   RDW 01.0 27.2 - 53.6 %   Platelets 278 150 - 400 K/uL   nRBC 0.0 0.0 - 0.2 %    Comment: Performed at [facility], [address] [place], [place] [address]  Urinalysis, Routine w reflex microscopic -Urine, Random     Status: Abnormal    Collection Time: 09/18/22  9:05 AM  Result Value Ref Range   Color, Urine YELLOW (A) YELLOW   APPearance CLEAR (A) CLEAR   Specific Gravity, Urine 1.010 1.005 - 1.030   pH 7.0 5.0 - 8.0   Glucose, UA NEGATIVE NEGATIVE mg/dL   Hgb urine dipstick NEGATIVE NEGATIVE   Bilirubin Urine NEGATIVE NEGATIVE   Ketones, ur NEGATIVE NEGATIVE mg/dL   Protein, ur 474 (A) NEGATIVE mg/dL   Nitrite NEGATIVE NEGATIVE   Leukocytes,Ua SMALL (A) NEGATIVE   RBC / HPF 0-5 0 - 5 RBC/hpf   WBC, UA 0-5 0 - 5 WBC/hpf  Bacteria, UA RARE (A) NONE SEEN   Squamous Epithelial / HPF 0-5 0 - 5 /HPF   Mucus PRESENT     Comment: Performed at [facility], [address] [place], [place] [address]  Urine Culture     Status: Abnormal   Collection Time: 09/18/22  9:06 AM   Specimen: Urine, Clean Catch  Result Value Ref Range   Specimen Description      URINE, CLEAN CATCH Performed at [facility], [address] [place], [place] [address]    Special Requests      NONE Performed at [facility], [address] [place], [place] [address]    Culture >=100,000 COLONIES/mL STAPHYLOCOCCUS EPIDERMIDIS (A)    Report Status 09/20/2022 FINAL    Organism ID, Bacteria STAPHYLOCOCCUS EPIDERMIDIS (A)       Susceptibility   Staphylococcus epidermidis - MIC*    CIPROFLOXACIN <=0.5 SENSITIVE Sensitive     GENTAMICIN <=0.5 SENSITIVE Sensitive     NITROFURANTOIN <=16 SENSITIVE Sensitive     OXACILLIN <=0.25 SENSITIVE Sensitive     TETRACYCLINE <=1 SENSITIVE Sensitive     VANCOMYCIN 1 SENSITIVE Sensitive     TRIMETH/SULFA <=10 SENSITIVE Sensitive     CLINDAMYCIN <=0.25 SENSITIVE Sensitive     RIFAMPIN <=0.5 SENSITIVE Sensitive     Inducible Clindamycin NEGATIVE Sensitive     * >=100,000 COLONIES/mL STAPHYLOCOCCUS EPIDERMIDIS  POCT CBG (Fasting - Glucose)     Status: Abnormal   Collection Time: 09/22/22  1:57 PM  Result Value Ref Range   Glucose Fasting, POC 192 (A) 70 - 99 mg/dL  POCT CBG  (Fasting - Glucose)     Status: Abnormal   Collection Time: 10/14/22 11:21 AM  Result Value Ref Range   Glucose Fasting, POC 128 (A) 70 - 99 mg/dL  Lipid panel     Status: None   Collection Time: 10/14/22 12:18 PM  Result Value Ref Range   Cholesterol, Total 177 100 - 199 mg/dL   Triglycerides 010 0 - 149 mg/dL   HDL 66 >27 mg/dL   VLDL Cholesterol Cal 20 5 - 40 mg/dL   LDL Chol Calc (NIH) 91 0 - 99 mg/dL   Chol/HDL Ratio 2.7 0.0 - 4.4 ratio    Comment:                                   T. Chol/HDL Ratio                                             Men  Women                               1/2 Avg.Risk  3.4    3.3                                   Avg.Risk  5.0    4.4                                2X Avg.Risk  9.6    7.1  3X Avg.Risk 23.4   11.0   VITAMIN D 25 Hydroxy (Vit-D Deficiency, Fractures)     Status: None   Collection Time: 10/14/22 12:18 PM  Result Value Ref Range   Vit D, 25-Hydroxy 52.8 30.0 - 100.0 ng/mL    Comment: Vitamin D deficiency has been defined by the Institute of Medicine and an Endocrine Society practice guideline as a level of serum 25-OH vitamin D less than 20 ng/mL (1,2). The Endocrine Society went on to further define vitamin D insufficiency as a level between 21 and 29 ng/mL (2). 1. IOM (Institute of Medicine). 2010. Dietary reference    intakes for calcium and D. [place]: The    [employer]. 2. Holick MF, Binkley [place], Bischoff-Ferrari HA, et al.    Evaluation, treatment, and prevention of vitamin D    deficiency: an Endocrine Society clinical practice    guideline. JCEM. 2011 Jul; 96(7):1911-30.   CMP14+EGFR     Status: Abnormal   Collection Time: 10/14/22 12:18 PM  Result Value Ref Range   Glucose 91 70 - 99 mg/dL   BUN 43 (H) 8 - 27 mg/dL   Creatinine, Ser 7.56 (H) 0.57 - 1.00 mg/dL   eGFR 25 (L) >43 PI/RJJ/8.84   BUN/Creatinine Ratio 20 12 - 28   Sodium 140 134 - 144 mmol/L   Potassium 4.9  3.5 - 5.2 mmol/L   Chloride 102 96 - 106 mmol/L   CO2 23 20 - 29 mmol/L   Calcium 9.1 8.7 - 10.3 mg/dL   Total Protein 7.0 6.0 - 8.5 g/dL   Albumin 4.4 3.9 - 4.9 g/dL   Globulin, Total 2.6 1.5 - 4.5 g/dL   Bilirubin Total 0.2 0.0 - 1.2 mg/dL   Alkaline Phosphatase 338 (H) 44 - 121 IU/L   AST 52 (H) 0 - 40 IU/L   ALT 40 (H) 0 - 32 IU/L  Hemoglobin A1c     Status: Abnormal   Collection Time: 10/14/22 12:18 PM  Result Value Ref Range   Hgb A1c MFr Bld 7.9 (H) 4.8 - 5.6 %    Comment:          Prediabetes: 5.7 - 6.4          Diabetes: >6.4          Glycemic control for adults with diabetes: <7.0    Est. average glucose Bld gHb Est-mCnc 180 mg/dL  Vitamin Z66     Status: None   Collection Time: 10/14/22 12:18 PM  Result Value Ref Range   Vitamin B-12 625 232 - 1,245 pg/mL  CBG monitoring, ED     Status: Abnormal   Collection Time: 10/27/22  1:46 PM  Result Value Ref Range   Glucose-Capillary 125 (H) 70 - 99 mg/dL    Comment: Glucose reference range applies only to samples taken after fasting for at least 8 hours.  Basic metabolic panel     Status: Abnormal   Collection Time: 10/27/22  1:58 PM  Result Value Ref Range   Sodium 138 135 - 145 mmol/L   Potassium 3.4 (L) 3.5 - 5.1 mmol/L   Chloride 102 98 - 111 mmol/L   CO2 24 22 - 32 mmol/L   Glucose, Bld 135 (H) 70 - 99 mg/dL    Comment: Glucose reference range applies only to samples taken after fasting for at least 8 hours.   BUN 39 (H) 8 - 23 mg/dL   Creatinine, Ser 0.63 (H) 0.44 - 1.00 mg/dL  Calcium 9.4 8.9 - 10.3 mg/dL   GFR, Estimated 34 (L) >60 mL/min    Comment: (NOTE) Calculated using the CKD-EPI Creatinine Equation (2021)    Anion gap 12 5 - 15    Comment: Performed at [facility], [address] [place], [place] [address]  CBC     Status: None   Collection Time: 10/27/22  1:58 PM  Result Value Ref Range   WBC 9.5 4.0 - 10.5 K/uL   RBC 4.51 3.87 - 5.11 MIL/uL   Hemoglobin 13.2 12.0 - 15.0 g/dL   HCT  95.6 21.3 - 08.6 %   MCV 85.8 80.0 - 100.0 fL   MCH 29.3 26.0 - 34.0 pg   MCHC 34.1 30.0 - 36.0 g/dL   RDW 57.8 46.9 - 62.9 %   Platelets 262 150 - 400 K/uL   nRBC 0.0 0.0 - 0.2 %    Comment: Performed at [facility], [address] [place], [place] [address]  Lipase, blood     Status: Abnormal   Collection Time: 10/27/22  1:58 PM  Result Value Ref Range   Lipase 378 (H) 11 - 51 U/L    Comment: Performed at [facility], [address] [place], [place] [address]       Assessment & Plan:   Problem List Items Addressed This Visit       Active Problems   Hypertension    Blood pressure well controlled with current medications.  Continue current therapy.  Will reassess at follow up.       Relevant Orders   CMP14+EGFR (Completed)   CBC with Differential/Platelet   HLD (hyperlipidemia)    Checking labs today.  Continue current therapy for lipid control. Will modify as needed based on labwork results.        Relevant Orders   Lipid panel (Completed)   CMP14+EGFR (Completed)   CBC with Differential/Platelet   Type 2 diabetes mellitus with hyperlipidemia (HCC) - Primary    Checking labs today.  Continue current therapy for lipid control. Will modify as needed based on labwork results.        Relevant Orders   POCT CBG (Fasting - Glucose) (Completed)   CMP14+EGFR (Completed)   CBC with Differential/Platelet   Chronic kidney disease, stage 3b (HCC)    Checking labs today Patient has been referred to Nephrology.       Relevant Orders   CMP14+EGFR (Completed)   CBC with Differential/Platelet   Ambulatory referral to Nephrology   Type 2 diabetes mellitus with hyperglycemia, with long-term current use of insulin (HCC)    Checking labs today. Will call pt. With results  Continue current diabetes POC, as patient has been well controlled on current regimen.  Will adjust meds if needed based on labs.       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  Well controlled with current therapy.   Continue current meds.       Relevant Medications   famotidine (PEPCID) 20 MG tablet   Other Relevant Orders   CMP14+EGFR (Completed)   CBC with Differential/Platelet   Other Visit Diagnoses     Vitamin D deficiency, unspecified       Checking labs today.  Will continue supplements as needed.   Relevant Orders   VITAMIN D 25 Hydroxy (Vit-D Deficiency, Fractures) (Completed)  CMP14+EGFR (Completed)   CBC with Differential/Platelet   B12 deficiency due to diet       Checking labs today.  Will continue supplements as needed.   Relevant Orders   CMP14+EGFR (Completed)   Vitamin B12 (Completed)   CBC with Differential/Platelet   Flu vaccine need       Flu vaccine given in office today. Pt encouraged to manage side effects with supportive measures.       Return in about 3 months (around 01/14/2023).   Total time spent: 30 minutes  Miki Kins, FNP  10/14/2022   This document may have been prepared by [facility] Voice Recognition software and as such may include unintentional dictation errors.

## 2022-11-29 NOTE — Assessment & Plan Note (Signed)
Blood pressure well controlled with current medications.  Continue current therapy.  Will reassess at follow up.  

## 2022-11-30 ENCOUNTER — Other Ambulatory Visit: Payer: Self-pay | Admitting: Family

## 2022-12-15 ENCOUNTER — Other Ambulatory Visit: Payer: Self-pay | Admitting: Family

## 2023-01-07 ENCOUNTER — Other Ambulatory Visit: Payer: Self-pay

## 2023-01-07 MED ORDER — FAMOTIDINE 20 MG PO TABS: 20.0000 mg | ORAL_TABLET | Freq: Two times a day (BID) | ORAL | 0 refills | Status: DC

## 2023-01-12 ENCOUNTER — Ambulatory Visit: Payer: Medicaid Other | Admitting: Family

## 2023-01-12 ENCOUNTER — Encounter: Payer: Self-pay | Admitting: Family

## 2023-01-12 VITALS — BP 124/70 | HR 90 | Ht 61.0 in | Wt 114.6 lb

## 2023-01-12 DIAGNOSIS — E782 Mixed hyperlipidemia: Secondary | ICD-10-CM

## 2023-01-12 DIAGNOSIS — E538 Deficiency of other specified B group vitamins: Secondary | ICD-10-CM | POA: Diagnosis not present

## 2023-01-12 DIAGNOSIS — F1020 Alcohol dependence, uncomplicated: Secondary | ICD-10-CM

## 2023-01-12 DIAGNOSIS — N1832 Chronic kidney disease, stage 3b: Secondary | ICD-10-CM | POA: Diagnosis not present

## 2023-01-12 DIAGNOSIS — E559 Vitamin D deficiency, unspecified: Secondary | ICD-10-CM

## 2023-01-12 DIAGNOSIS — K860 Alcohol-induced chronic pancreatitis: Secondary | ICD-10-CM

## 2023-01-12 DIAGNOSIS — E1165 Type 2 diabetes mellitus with hyperglycemia: Secondary | ICD-10-CM | POA: Diagnosis not present

## 2023-01-12 DIAGNOSIS — Z794 Long term (current) use of insulin: Secondary | ICD-10-CM

## 2023-01-12 DIAGNOSIS — I10 Essential (primary) hypertension: Secondary | ICD-10-CM

## 2023-01-12 DIAGNOSIS — E1142 Type 2 diabetes mellitus with diabetic polyneuropathy: Secondary | ICD-10-CM

## 2023-01-12 MED ORDER — AMLODIPINE BESYLATE 10 MG PO TABS: 10.0000 mg | ORAL_TABLET | Freq: Every day | ORAL | 1 refills | Status: DC

## 2023-01-12 MED ORDER — MOUNJARO 5 MG/0.5ML ~~LOC~~ SOAJ: 5.0000 mg | SUBCUTANEOUS | 3 refills | Status: DC

## 2023-01-13 ENCOUNTER — Other Ambulatory Visit: Payer: Self-pay | Admitting: Family

## 2023-01-13 LAB — CBC WITH DIFFERENTIAL/PLATELET
Basophils Absolute: 0.1 10*3/uL (ref 0.0–0.2)
Basos: 1 %
EOS (ABSOLUTE): 0.2 10*3/uL (ref 0.0–0.4)
Eos: 2 %
Hematocrit: 36.6 % (ref 34.0–46.6)
Hemoglobin: 12.1 g/dL (ref 11.1–15.9)
Immature Grans (Abs): 0.1 10*3/uL (ref 0.0–0.1)
Immature Granulocytes: 1 %
Lymphocytes Absolute: 3.2 10*3/uL — ABNORMAL HIGH (ref 0.7–3.1)
Lymphs: 31 %
MCH: 30 pg (ref 26.6–33.0)
MCHC: 33.1 g/dL (ref 31.5–35.7)
MCV: 91 fL (ref 79–97)
Monocytes Absolute: 1 10*3/uL — ABNORMAL HIGH (ref 0.1–0.9)
Monocytes: 9 %
Neutrophils Absolute: 5.7 10*3/uL (ref 1.4–7.0)
Neutrophils: 56 %
Platelets: 267 10*3/uL (ref 150–450)
RBC: 4.04 x10E6/uL (ref 3.77–5.28)
RDW: 12.3 % (ref 11.7–15.4)
WBC: 10.3 10*3/uL (ref 3.4–10.8)

## 2023-01-13 LAB — CMP14+EGFR
ALT: 57 [IU]/L — ABNORMAL HIGH (ref 0–32)
AST: 52 [IU]/L — ABNORMAL HIGH (ref 0–40)
Albumin: 4 g/dL (ref 3.9–4.9)
Alkaline Phosphatase: 495 [IU]/L — ABNORMAL HIGH (ref 44–121)
BUN/Creatinine Ratio: 21 (ref 12–28)
BUN: 46 mg/dL — ABNORMAL HIGH (ref 8–27)
Bilirubin Total: <0.2 mg/dL (ref 0.0–1.2)
CO2: 25 mmol/L (ref 20–29)
Calcium: 9.6 mg/dL (ref 8.7–10.3)
Chloride: 99 mmol/L (ref 96–106)
Creatinine, Ser: 2.16 mg/dL — ABNORMAL HIGH (ref 0.57–1.00)
Globulin, Total: 2.8 g/dL (ref 1.5–4.5)
Glucose: 189 mg/dL — ABNORMAL HIGH (ref 70–99)
Potassium: 4.9 mmol/L (ref 3.5–5.2)
Sodium: 137 mmol/L (ref 134–144)
Total Protein: 6.8 g/dL (ref 6.0–8.5)
eGFR: 25 mL/min/{1.73_m2} — ABNORMAL LOW (ref >59)

## 2023-01-13 LAB — HEMOGLOBIN A1C
Est. average glucose Bld gHb Est-mCnc: 255 mg/dL
Hgb A1c MFr Bld: 10.5 % — ABNORMAL HIGH (ref 4.8–5.6)

## 2023-01-13 LAB — VITAMIN D 25 HYDROXY (VIT D DEFICIENCY, FRACTURES): Vit D, 25-Hydroxy: 29.6 ng/mL — ABNORMAL LOW (ref 30.0–100.0)

## 2023-01-13 LAB — VITAMIN B12: Vitamin B-12: 781 pg/mL (ref 232–1245)

## 2023-01-13 LAB — LIPID PANEL
Chol/HDL Ratio: 2.7 {ratio} (ref 0.0–4.4)
Cholesterol, Total: 184 mg/dL (ref 100–199)
HDL: 68 mg/dL (ref >39)
LDL Chol Calc (NIH): 78 mg/dL (ref 0–99)
Triglycerides: 235 mg/dL — ABNORMAL HIGH (ref 0–149)
VLDL Cholesterol Cal: 38 mg/dL (ref 5–40)

## 2023-01-13 LAB — TSH: TSH: 2.44 u[IU]/mL (ref 0.450–4.500)

## 2023-01-14 ENCOUNTER — Ambulatory Visit: Payer: Medicaid Other | Admitting: Family

## 2023-01-23 ENCOUNTER — Other Ambulatory Visit: Payer: Self-pay | Admitting: Family

## 2023-02-01 ENCOUNTER — Telehealth: Payer: Self-pay | Admitting: Family

## 2023-02-01 NOTE — Telephone Encounter (Signed)
Patient called in c/o neck and head pain, neck stiffness, left knee/legs giving out and warm, its "tight" to urinate, and tired. Cannot stand without feeling like her legs are going to give out. Advised patient to go to ER where they can do any scans that may be necessary.

## 2023-02-10 ENCOUNTER — Telehealth: Payer: Self-pay

## 2023-02-10 DIAGNOSIS — R809 Proteinuria, unspecified: Secondary | ICD-10-CM | POA: Insufficient documentation

## 2023-02-10 DIAGNOSIS — I129 Hypertensive chronic kidney disease with stage 1 through stage 4 chronic kidney disease, or unspecified chronic kidney disease: Secondary | ICD-10-CM | POA: Insufficient documentation

## 2023-02-10 NOTE — Telephone Encounter (Signed)
 Pt called and wanted to inform you that she saw her kidney DR and they started her back on Farxiga-

## 2023-02-11 ENCOUNTER — Other Ambulatory Visit: Payer: Self-pay | Admitting: Family

## 2023-02-13 ENCOUNTER — Other Ambulatory Visit: Payer: Self-pay | Admitting: Family

## 2023-02-24 ENCOUNTER — Other Ambulatory Visit: Payer: Self-pay | Admitting: Family

## 2023-03-11 ENCOUNTER — Other Ambulatory Visit: Payer: Self-pay | Admitting: Family

## 2023-03-12 ENCOUNTER — Encounter: Payer: Self-pay | Admitting: Family

## 2023-03-12 NOTE — Progress Notes (Signed)
 Established Patient Office Visit  Subjective:  Patient ID: Karen [name], female    DOB: [birth year]  Age: 68 y.o. MRN: [id number]  Chief Complaint  Patient presents with   Follow-up    3 month follow up    Patient is here today for her 3 months follow up.  She has been feeling fairly well since last appointment.   She does not have additional concerns to discuss today.  Labs are due today. She needs refills.   I have reviewed her active problem list, medication list, allergies, health maintenance, notes from last encounter, lab results for her appointment today.      No other concerns at this time.   Past Medical History:  Diagnosis Date   Abdominal pain    Acute cystitis 05/18/2020   Acute kidney injury superimposed on CKD (HCC)    Acute on chronic pancreatitis (HCC) 01/03/2017   Acute renal insufficiency 10/29/2015   Adhesive capsulitis of left shoulder    Alcoholic pancreatitis    Asthma    Depression    Diabetes mellitus without complication (HCC)    Diabetic cataract of right eye (HCC) 08/31/2019   Formatting of this note might be different from the original. Added automatically from request for surgery [id number]   DKA (diabetic ketoacidoses) 02/17/2015   DKA (diabetic ketoacidosis) (HCC) 02/17/2015   IMO SNOMED Dx Update Oct 2024     Elevated alkaline phosphatase level 10/30/2021   Emphysematous cystitis    Esophageal candidiasis (HCC)    ETOH abuse 01/03/2017   Headache 02/26/2019   Hypercholesteremia    Hypertension    Hypertensive urgency 04/21/2016   Hypokalemia    Left flank pain 10/29/2015   Left upper quadrant abdominal pain 05/18/2020   Leukocytosis 06/13/2019   Malignant essential hypertension 10/29/2015   Upper GI bleed 11/11/2017   UTI (urinary tract infection) 01/24/2018    Past Surgical History:  Procedure Laterality Date   ABDOMINAL HYSTERECTOMY  1996   APPENDECTOMY  1997   ESOPHAGOGASTRODUODENOSCOPY N/A 11/12/2017   Procedure:  ESOPHAGOGASTRODUODENOSCOPY (EGD);  Surgeon: Toney Reil, MD;  Location: [facility] ENDOSCOPY;  Service: Gastroenterology;  Laterality: N/A;   ESOPHAGOGASTRODUODENOSCOPY N/A 09/29/2018   Procedure: ESOPHAGOGASTRODUODENOSCOPY (EGD);  Surgeon: Toledo, Boykin Nearing, MD;  Location: [facility] ENDOSCOPY;  Service: Gastroenterology;  Laterality: N/A;   ESOPHAGOGASTRODUODENOSCOPY (EGD) WITH PROPOFOL N/A 04/24/2016   Procedure: ESOPHAGOGASTRODUODENOSCOPY (EGD) WITH PROPOFOL;  Surgeon: Midge Minium, MD;  Location: [facility] ENDOSCOPY;  Service: Endoscopy;  Laterality: N/A;   ESOPHAGOGASTRODUODENOSCOPY (EGD) WITH PROPOFOL N/A 01/28/2019   Procedure: ESOPHAGOGASTRODUODENOSCOPY (EGD) WITH PROPOFOL;  Surgeon: Toledo, Boykin Nearing, MD;  Location: [facility] ENDOSCOPY;  Service: Gastroenterology;  Laterality: N/A;   EYE SURGERY     HAND SURGERY  1988   THYROID SURGERY  2013    Social History   Socioeconomic History   Marital status: Single    Spouse name: Not on file   Number of children: Not on file   Years of education: Not on file   Highest education level: Not on file  Occupational History   Occupation: disabled  Tobacco Use   Smoking status: Former   Smokeless tobacco: Never   Tobacco comments:    quit 7 years ago   Vaping Use   Vaping status: Never Used  Substance and Sexual Activity   Alcohol use: Yes    Comment: drank wine yesterday   Drug use: No   Sexual activity: Not on file  Other Topics Concern   Not on file  Social History Narrative   Not on file   Social Drivers of Health   Financial Resource Strain: Low Risk  (10/31/2021)   Received from [facility], [facility]   Overall Financial Resource Strain (CARDIA)    Difficulty of Paying Living Expenses: Not hard at all  Food Insecurity: No Food Insecurity (10/31/2021)   Received from [facility], [facility]   Hunger Vital Sign    Worried About Running Out of Food in the Last Year: Never true    Ran Out of Food in the Last Year:  Never true  Transportation Needs: Unmet Transportation Needs (10/31/2021)   Received from [facility], [facility]   PRAPARE - Transportation    Lack of Transportation (Medical): Yes    Lack of Transportation (Non-Medical): Yes  Physical Activity: Not on file  Stress: Not on file  Social Connections: Not on file  Intimate Partner Violence: Not on file    Family History  Problem Relation Age of Onset   Diabetes Mother     Allergies  Allergen Reactions   Metoclopramide Other (See Comments) and Shortness Of Breath    Hypotension, shortness of breath  Hypotension, shortness of breath  Hypotension, shortness of breath  Hypotension, shortness of breath    Other Reaction(s): Other (See Comments)    Hypotension, shortness of breath  Hypotension, shortness of breath Hypotension, shortness of breath   Penicillins Anaphylaxis, Rash and Swelling    Has patient had a PCN reaction causing immediate rash, facial/tongue/throat swelling, SOB or lightheadedness with hypotension: Yes  Has patient had a PCN reaction causing severe rash involving mucus membranes or skin necrosis: No  Has patient had a PCN reaction that required hospitalization No  Has patient had a PCN reaction occurring within the last 10 years: No  If all of the above answers are "NO", then may proceed with Cephalosporin use.  Has patient had a PCN reaction causing immediate rash, facial/tongue/throat swelling, SOB or lightheadedness with hypotension: Yes  Has patient had a PCN reaction causing severe rash involving mucus membranes or skin necrosis: No  Has patient had a PCN reaction that required hospitalization No  Has patient had a PCN reaction occurring within the last 10 years: No  If all of the above answers are "NO", then may proceed with Cephalosporin use.  Has patient had a PCN reaction causing immediate rash, facial/tongue/throat swelling, SOB or lightheadedness with hypotension: Yes  Has patient had a  PCN reaction causing severe rash involving mucus membranes or skin necrosis: No  Has patient had a PCN reaction that required hospitalization No  Has patient had a PCN reaction occurring within the last 10 years: No  If all of the above answers are "NO", then may proceed with Cephalosporin use.  Has patient had a PCN reaction causing immediate rash, facial/tongue/throat swelling, SOB or lightheadedness with hypotension: Yes Has patient had a PCN reaction causing severe rash involving mucus membranes or skin necrosis: No Has patient had a PCN reaction that required hospitalization No Has patient had a PCN reaction occurring within the last 10 years: No If all of the above answers are "NO", then may proceed with Cephalosporin use.   Fentanyl Rash    Rash   Other Rash    Bee sting   Bee sting  Bee sting    Bee sting   Bee sting   Oxycodone-Acetaminophen Rash    plain tylenol can also make itch?  plain tylenol can  also make itch?  plain tylenol can also make itch?  plain tylenol can also make itch?    plain tylenol can also make itch?  plain tylenol can also make itch? plain tylenol can also make itch?   Percocet [Oxycodone-Acetaminophen] Rash    plain tylenol can also make itch?    Review of Systems  All other systems reviewed and are negative.      Objective:   BP 124/70   Pulse 90   Ht 5\' 1"  (1.549 m)   Wt 114 lb 9.6 oz (52 kg)   SpO2 98%   BMI 21.65 kg/m   Vitals:   01/12/23 1410  BP: 124/70  Pulse: 90  Height: 5\' 1"  (1.549 m)  Weight: 114 lb 9.6 oz (52 kg)  SpO2: 98%  BMI (Calculated): 21.66    Physical Exam Vitals and nursing note reviewed.  Constitutional:      Appearance: Normal appearance. She is normal weight.  HENT:     Head: Normocephalic.  Eyes:     Extraocular Movements: Extraocular movements intact.     Conjunctiva/sclera: Conjunctivae normal.     Pupils: Pupils are equal, round, and reactive to light.  Cardiovascular:     Rate and Rhythm:  Normal rate.  Pulmonary:     Effort: Pulmonary effort is normal.  Musculoskeletal:        General: Normal range of motion.  Neurological:     General: No focal deficit present.     Mental Status: She is alert and oriented to person, place, and time. Mental status is at baseline.  Psychiatric:        Mood and Affect: Mood normal.        Behavior: Behavior normal.        Thought Content: Thought content normal.        Judgment: Judgment normal.      Results for orders placed or performed in visit on 01/12/23  Lipid panel  Result Value Ref Range   Cholesterol, Total 184 100 - 199 mg/dL   Triglycerides 161 (H) 0 - 149 mg/dL   HDL 68 >09 mg/dL   VLDL Cholesterol Cal 38 5 - 40 mg/dL   LDL Chol Calc (NIH) 78 0 - 99 mg/dL   Chol/HDL Ratio 2.7 0.0 - 4.4 ratio  VITAMIN D 25 Hydroxy (Vit-D Deficiency, Fractures)  Result Value Ref Range   Vit D, 25-Hydroxy 29.6 (L) 30.0 - 100.0 ng/mL  CMP14+EGFR  Result Value Ref Range   Glucose 189 (H) 70 - 99 mg/dL   BUN 46 (H) 8 - 27 mg/dL   Creatinine, Ser 6.04 (H) 0.57 - 1.00 mg/dL   eGFR 25 (L) >54 UJ/WJX/9.14   BUN/Creatinine Ratio 21 12 - 28   Sodium 137 134 - 144 mmol/L   Potassium 4.9 3.5 - 5.2 mmol/L   Chloride 99 96 - 106 mmol/L   CO2 25 20 - 29 mmol/L   Calcium 9.6 8.7 - 10.3 mg/dL   Total Protein 6.8 6.0 - 8.5 g/dL   Albumin 4.0 3.9 - 4.9 g/dL   Globulin, Total 2.8 1.5 - 4.5 g/dL   Bilirubin Total <7.8 0.0 - 1.2 mg/dL   Alkaline Phosphatase 495 (H) 44 - 121 IU/L   AST 52 (H) 0 - 40 IU/L   ALT 57 (H) 0 - 32 IU/L  TSH  Result Value Ref Range   TSH 2.440 0.450 - 4.500 uIU/mL  Hemoglobin A1c  Result Value Ref Range   Hgb A1c MFr  Bld 10.5 (H) 4.8 - 5.6 %   Est. average glucose Bld gHb Est-mCnc 255 mg/dL  Vitamin A35  Result Value Ref Range   Vitamin B-12 781 232 - 1,245 pg/mL  CBC with Diff  Result Value Ref Range   WBC 10.3 3.4 - 10.8 x10E3/uL   RBC 4.04 3.77 - 5.28 x10E6/uL   Hemoglobin 12.1 11.1 - 15.9 g/dL   Hematocrit  57.3 22.0 - 46.6 %   MCV 91 79 - 97 fL   MCH 30.0 26.6 - 33.0 pg   MCHC 33.1 31.5 - 35.7 g/dL   RDW 25.4 27.0 - 62.3 %   Platelets 267 150 - 450 x10E3/uL   Neutrophils 56 Not Estab. %   Lymphs 31 Not Estab. %   Monocytes 9 Not Estab. %   Eos 2 Not Estab. %   Basos 1 Not Estab. %   Neutrophils Absolute 5.7 1.4 - 7.0 x10E3/uL   Lymphocytes Absolute 3.2 (H) 0.7 - 3.1 x10E3/uL   Monocytes Absolute 1.0 (H) 0.1 - 0.9 x10E3/uL   EOS (ABSOLUTE) 0.2 0.0 - 0.4 x10E3/uL   Basophils Absolute 0.1 0.0 - 0.2 x10E3/uL   Immature Granulocytes 1 Not Estab. %   Immature Grans (Abs) 0.1 0.0 - 0.1 x10E3/uL    Recent Results (from the past 2160 hours)  Lipid panel     Status: Abnormal   Collection Time: 01/12/23  2:59 PM  Result Value Ref Range   Cholesterol, Total 184 100 - 199 mg/dL   Triglycerides 762 (H) 0 - 149 mg/dL   HDL 68 >83 mg/dL   VLDL Cholesterol Cal 38 5 - 40 mg/dL   LDL Chol Calc (NIH) 78 0 - 99 mg/dL   Chol/HDL Ratio 2.7 0.0 - 4.4 ratio    Comment:                                   T. Chol/HDL Ratio                                             Men  Women                               1/2 Avg.Risk  3.4    3.3                                   Avg.Risk  5.0    4.4                                2X Avg.Risk  9.6    7.1                                3X Avg.Risk 23.4   11.0   VITAMIN D 25 Hydroxy (Vit-D Deficiency, Fractures)     Status: Abnormal   Collection Time: 01/12/23  2:59 PM  Result Value Ref Range   Vit D, 25-Hydroxy 29.6 (L) 30.0 - 100.0 ng/mL    Comment: Vitamin D deficiency has been defined by the Institute of Medicine and  an Endocrine Society practice guideline as a level of serum 25-OH vitamin D less than 20 ng/mL (1,2). The Endocrine Society went on to further define vitamin D insufficiency as a level between 21 and 29 ng/mL (2). 1. IOM (Institute of Medicine). 2010. Dietary reference    intakes for calcium and D. [place]: The    [employer]. 2.  Holick MF, Binkley Towner, Bischoff-Ferrari HA, et al.    Evaluation, treatment, and prevention of vitamin D    deficiency: an Endocrine Society clinical practice    guideline. JCEM. 2011 Jul; 96(7):1911-30.   CMP14+EGFR     Status: Abnormal   Collection Time: 01/12/23  2:59 PM  Result Value Ref Range   Glucose 189 (H) 70 - 99 mg/dL   BUN 46 (H) 8 - 27 mg/dL   Creatinine, Ser 0.96 (H) 0.57 - 1.00 mg/dL   eGFR 25 (L) >04 VW/UJW/1.19   BUN/Creatinine Ratio 21 12 - 28   Sodium 137 134 - 144 mmol/L   Potassium 4.9 3.5 - 5.2 mmol/L   Chloride 99 96 - 106 mmol/L   CO2 25 20 - 29 mmol/L   Calcium 9.6 8.7 - 10.3 mg/dL   Total Protein 6.8 6.0 - 8.5 g/dL   Albumin 4.0 3.9 - 4.9 g/dL   Globulin, Total 2.8 1.5 - 4.5 g/dL   Bilirubin Total <1.4 0.0 - 1.2 mg/dL   Alkaline Phosphatase 495 (H) 44 - 121 IU/L   AST 52 (H) 0 - 40 IU/L   ALT 57 (H) 0 - 32 IU/L  TSH     Status: None   Collection Time: 01/12/23  2:59 PM  Result Value Ref Range   TSH 2.440 0.450 - 4.500 uIU/mL  Hemoglobin A1c     Status: Abnormal   Collection Time: 01/12/23  2:59 PM  Result Value Ref Range   Hgb A1c MFr Bld 10.5 (H) 4.8 - 5.6 %    Comment:          Prediabetes: 5.7 - 6.4          Diabetes: >6.4          Glycemic control for adults with diabetes: <7.0    Est. average glucose Bld gHb Est-mCnc 255 mg/dL  Vitamin N82     Status: None   Collection Time: 01/12/23  2:59 PM  Result Value Ref Range   Vitamin B-12 781 232 - 1,245 pg/mL  CBC with Diff     Status: Abnormal   Collection Time: 01/12/23  2:59 PM  Result Value Ref Range   WBC 10.3 3.4 - 10.8 x10E3/uL   RBC 4.04 3.77 - 5.28 x10E6/uL   Hemoglobin 12.1 11.1 - 15.9 g/dL   Hematocrit 95.6 21.3 - 46.6 %   MCV 91 79 - 97 fL   MCH 30.0 26.6 - 33.0 pg   MCHC 33.1 31.5 - 35.7 g/dL   RDW 08.6 57.8 - 46.9 %   Platelets 267 150 - 450 x10E3/uL   Neutrophils 56 Not Estab. %   Lymphs 31 Not Estab. %   Monocytes 9 Not Estab. %   Eos 2 Not Estab. %   Basos 1 Not Estab.  %   Neutrophils Absolute 5.7 1.4 - 7.0 x10E3/uL   Lymphocytes Absolute 3.2 (H) 0.7 - 3.1 x10E3/uL   Monocytes Absolute 1.0 (H) 0.1 - 0.9 x10E3/uL   EOS (ABSOLUTE) 0.2 0.0 - 0.4 x10E3/uL   Basophils Absolute 0.1 0.0 - 0.2 x10E3/uL   Immature Granulocytes 1  Not Estab. %   Immature Grans (Abs) 0.1 0.0 - 0.1 x10E3/uL       Assessment & Plan:   Problem List Items Addressed This Visit       Cardiovascular and Mediastinum   Hypertension   Blood pressure well controlled with current medications.  Continue current therapy.  Will reassess at follow up.        Relevant Medications   amLODipine (NORVASC) 10 MG tablet   Other Relevant Orders   CMP14+EGFR (Completed)   TSH (Completed)   Hemoglobin A1c (Completed)   CBC with Diff (Completed)     Digestive   Chronic pancreatitis due to chronic alcoholism (HCC)   Patient stable.  Well controlled with current therapy.   Continue current meds.          Endocrine   Diabetic polyneuropathy associated with type 2 diabetes mellitus (HCC)   Patient stable.  Well controlled with current therapy.   Continue current meds.        Relevant Medications   tirzepatide [facility]) 5 MG/0.5ML Pen   Type 2 diabetes mellitus with hyperglycemia, with long-term current use of insulin (HCC) - Primary   Checking labs today. Will call pt. With results  Continue current diabetes POC, as patient has been well controlled on current regimen.  Will adjust meds if needed based on labs.        Relevant Medications   tirzepatide (MOUNJARO) 5 MG/0.5ML Pen   Other Relevant Orders   CMP14+EGFR (Completed)   TSH (Completed)   CBC with Diff (Completed)     Genitourinary   Chronic kidney disease, stage 3b (HCC)   Patient is seen by Nephrology, who manage this condition.  She is well controlled with current therapy.   Will defer to them for further changes to plan of care.       Relevant Orders   CMP14+EGFR (Completed)   TSH (Completed)   CBC  with Diff (Completed)     Other   HLD (hyperlipidemia)   Checking labs today.  Continue current therapy for lipid control. Will modify as needed based on labwork results.        Relevant Medications   amLODipine (NORVASC) 10 MG tablet   Other Relevant Orders   Lipid panel (Completed)   CMP14+EGFR (Completed)   TSH (Completed)   CBC with Diff (Completed)   Other Visit Diagnoses       B12 deficiency due to diet       Checking labs today.  Will continue supplements as needed.   Relevant Orders   CMP14+EGFR (Completed)   TSH (Completed)   Vitamin B12 (Completed)   CBC with Diff (Completed)     Vitamin D deficiency, unspecified       Checking labs today.  Will continue supplements as needed.   Relevant Orders   VITAMIN D 25 Hydroxy (Vit-D Deficiency, Fractures) (Completed)   CMP14+EGFR (Completed)   TSH (Completed)   CBC with Diff (Completed)       Return in about 3 months (around 04/12/2023) for F/U.   Total time spent: 20 minutes  Miki Kins, FNP  01/12/2023   This document may have been prepared by [facility] Voice Recognition software and as such may include unintentional dictation errors.

## 2023-03-12 NOTE — Assessment & Plan Note (Signed)
 Checking labs today.  Continue current therapy for lipid control. Will modify as needed based on labwork results.

## 2023-03-12 NOTE — Assessment & Plan Note (Signed)
 Patient is seen by Nephrology, who manage this condition.  She is well controlled with current therapy.   Will defer to them for further changes to plan of care.

## 2023-03-12 NOTE — Assessment & Plan Note (Signed)
 Checking labs today. Will call pt. With results  Continue current diabetes POC, as patient has been well controlled on current regimen.  Will adjust meds if needed based on labs.

## 2023-03-12 NOTE — Assessment & Plan Note (Signed)
 Patient stable.  Well controlled with current therapy.   Continue current meds.

## 2023-03-12 NOTE — Assessment & Plan Note (Signed)
 Blood pressure well controlled with current medications.  Continue current therapy.  Will reassess at follow up.

## 2023-03-24 ENCOUNTER — Other Ambulatory Visit: Payer: Self-pay | Admitting: Family

## 2023-03-29 ENCOUNTER — Other Ambulatory Visit: Payer: Self-pay | Admitting: Family

## 2023-03-31 DIAGNOSIS — N2581 Secondary hyperparathyroidism of renal origin: Secondary | ICD-10-CM | POA: Insufficient documentation

## 2023-04-03 ENCOUNTER — Emergency Department

## 2023-04-03 ENCOUNTER — Other Ambulatory Visit: Payer: Self-pay

## 2023-04-03 ENCOUNTER — Encounter: Payer: Self-pay | Admitting: Intensive Care

## 2023-04-03 ENCOUNTER — Emergency Department
Admission: EM | Admit: 2023-04-03 | Discharge: 2023-04-03 | Disposition: A | Discharge status: Home / Self Care | Attending: Emergency Medicine | Admitting: Emergency Medicine

## 2023-04-03 DIAGNOSIS — N3001 Acute cystitis with hematuria: Secondary | ICD-10-CM | POA: Diagnosis not present

## 2023-04-03 DIAGNOSIS — R0602 Shortness of breath: Secondary | ICD-10-CM | POA: Diagnosis not present

## 2023-04-03 DIAGNOSIS — R300 Dysuria: Secondary | ICD-10-CM

## 2023-04-03 DIAGNOSIS — R112 Nausea with vomiting, unspecified: Secondary | ICD-10-CM | POA: Diagnosis not present

## 2023-04-03 DIAGNOSIS — E871 Hypo-osmolality and hyponatremia: Secondary | ICD-10-CM | POA: Insufficient documentation

## 2023-04-03 DIAGNOSIS — R1032 Left lower quadrant pain: Secondary | ICD-10-CM

## 2023-04-03 DIAGNOSIS — N1832 Chronic kidney disease, stage 3b: Secondary | ICD-10-CM | POA: Diagnosis present

## 2023-04-03 DIAGNOSIS — R0789 Other chest pain: Secondary | ICD-10-CM | POA: Diagnosis not present

## 2023-04-03 DIAGNOSIS — E1122 Type 2 diabetes mellitus with diabetic chronic kidney disease: Secondary | ICD-10-CM | POA: Diagnosis not present

## 2023-04-03 DIAGNOSIS — J449 Chronic obstructive pulmonary disease, unspecified: Secondary | ICD-10-CM | POA: Insufficient documentation

## 2023-04-03 DIAGNOSIS — R059 Cough, unspecified: Secondary | ICD-10-CM | POA: Diagnosis not present

## 2023-04-03 DIAGNOSIS — R339 Retention of urine, unspecified: Secondary | ICD-10-CM | POA: Diagnosis not present

## 2023-04-03 DIAGNOSIS — N184 Chronic kidney disease, stage 4 (severe): Secondary | ICD-10-CM | POA: Diagnosis not present

## 2023-04-03 DIAGNOSIS — I129 Hypertensive chronic kidney disease with stage 1 through stage 4 chronic kidney disease, or unspecified chronic kidney disease: Secondary | ICD-10-CM | POA: Insufficient documentation

## 2023-04-03 DIAGNOSIS — N1330 Unspecified hydronephrosis: Secondary | ICD-10-CM

## 2023-04-03 DIAGNOSIS — N3000 Acute cystitis without hematuria: Secondary | ICD-10-CM

## 2023-04-03 DIAGNOSIS — R7989 Other specified abnormal findings of blood chemistry: Secondary | ICD-10-CM | POA: Diagnosis present

## 2023-04-03 LAB — URINALYSIS, W/ REFLEX TO CULTURE (INFECTION SUSPECTED)
Bilirubin Urine: NEGATIVE
Glucose, UA: >=500 mg/dL — AB
Ketones, ur: NEGATIVE mg/dL
Nitrite: NEGATIVE
Protein, ur: 100 mg/dL — AB
Specific Gravity, Urine: 1.005 (ref 1.005–1.030)
WBC, UA: >50 WBC/hpf (ref 0–5)
pH: 6 (ref 5.0–8.0)

## 2023-04-03 LAB — COMPREHENSIVE METABOLIC PANEL WITH GFR
ALT: 30 U/L (ref 0–44)
AST: 40 U/L (ref 15–41)
Albumin: 3.3 g/dL — ABNORMAL LOW (ref 3.5–5.0)
Alkaline Phosphatase: 307 U/L — ABNORMAL HIGH (ref 38–126)
Anion gap: 13 (ref 5–15)
BUN: 22 mg/dL (ref 8–23)
CO2: 18 mmol/L — ABNORMAL LOW (ref 22–32)
Calcium: 8.6 mg/dL — ABNORMAL LOW (ref 8.9–10.3)
Chloride: 97 mmol/L — ABNORMAL LOW (ref 98–111)
Creatinine, Ser: 1.82 mg/dL — ABNORMAL HIGH (ref 0.44–1.00)
GFR, Estimated: 30 mL/min — ABNORMAL LOW (ref >60)
Glucose, Bld: 379 mg/dL — ABNORMAL HIGH (ref 70–99)
Potassium: 3.7 mmol/L (ref 3.5–5.1)
Sodium: 128 mmol/L — ABNORMAL LOW (ref 135–145)
Total Bilirubin: 0.2 mg/dL (ref 0.0–1.2)
Total Protein: 7.1 g/dL (ref 6.5–8.1)

## 2023-04-03 LAB — CBC
HCT: 33.5 % — ABNORMAL LOW (ref 36.0–46.0)
Hemoglobin: 11.7 g/dL — ABNORMAL LOW (ref 12.0–15.0)
MCH: 30.4 pg (ref 26.0–34.0)
MCHC: 34.9 g/dL (ref 30.0–36.0)
MCV: 87 fL (ref 80.0–100.0)
Platelets: 269 10*3/uL (ref 150–400)
RBC: 3.85 MIL/uL — ABNORMAL LOW (ref 3.87–5.11)
RDW: 11.6 % (ref 11.5–15.5)
WBC: 9.9 10*3/uL (ref 4.0–10.5)
nRBC: 0 % (ref 0.0–0.2)

## 2023-04-03 LAB — ETHANOL: Alcohol, Ethyl (B): 87 mg/dL — ABNORMAL HIGH (ref <10)

## 2023-04-03 LAB — RESP PANEL BY RT-PCR (RSV, FLU A&B, COVID)  RVPGX2
Influenza A by PCR: NEGATIVE
Influenza B by PCR: NEGATIVE
Resp Syncytial Virus by PCR: NEGATIVE
SARS Coronavirus 2 by RT PCR: NEGATIVE

## 2023-04-03 LAB — LIPASE, BLOOD: Lipase: 77 U/L — ABNORMAL HIGH (ref 11–51)

## 2023-04-03 LAB — TROPONIN I (HIGH SENSITIVITY): Troponin I (High Sensitivity): 7 ng/L (ref <18)

## 2023-04-03 LAB — BRAIN NATRIURETIC PEPTIDE: B Natriuretic Peptide: 26.7 pg/mL (ref 0.0–100.0)

## 2023-04-03 MED ORDER — ONDANSETRON HCL 4 MG/2ML IJ SOLN: 4.0000 mg | Freq: Once | INTRAMUSCULAR | Status: AC

## 2023-04-03 MED ORDER — ONDANSETRON HCL 4 MG/2ML IJ SOLN
INTRAMUSCULAR | Status: AC
Start: 2023-04-03 — End: 2023-04-03
  Administered 2023-04-03: 4 mg via INTRAVENOUS
  Filled 2023-04-03: qty 2

## 2023-04-03 MED ORDER — MORPHINE SULFATE (PF) 4 MG/ML IV SOLN
4.0000 mg | Freq: Once | INTRAVENOUS | Status: AC
  Administered 2023-04-03: 4 mg via INTRAVENOUS
  Filled 2023-04-03: qty 1

## 2023-04-03 MED ORDER — LACTATED RINGERS IV BOLUS
1000.0000 mL | Freq: Once | INTRAVENOUS | Status: AC
  Administered 2023-04-03: 1000 mL via INTRAVENOUS

## 2023-04-03 MED ORDER — CEPHALEXIN 500 MG PO CAPS: 500.0000 mg | ORAL_CAPSULE | Freq: Two times a day (BID) | ORAL | 0 refills | Status: AC

## 2023-04-03 MED ORDER — SODIUM CHLORIDE 0.9 % IV SOLN
1.0000 g | Freq: Once | INTRAVENOUS | Status: AC
  Administered 2023-04-03: 1 g via INTRAVENOUS
  Filled 2023-04-03: qty 10

## 2023-04-03 NOTE — ED Notes (Signed)
 Called House sup to get a leg bag for pt so she can be d/c'd.

## 2023-04-03 NOTE — ED Provider Notes (Signed)
 [facility] Provider Note    Event Date/Time   First MD Initiated Contact with Patient 04/03/23 1411     (approximate)   History   Dysuria and Abdominal Pain   HPI  Karen [name] is a 68 y.o. female with history of hypertension, diabetes, hyperlipidemia, GERD, COPD, CKD who presents with left-sided abdominal pain as well as nausea vomiting and dysuria.  States started 3 days ago.  No fever.  She does have some chest tightness and shortness of breath as well as cough.  States that the abdominal pain also radiates to her left flank.  States that she feels very dehydrated, feels like her mouth is very dry and because of that she feels like she cannot talk as well because her mouth is dry.  Does not feel like she has any speech deficits at this time, does not have any trouble getting words out or that her speech is off.  She feels generalized weakness and bodyaches as well as chills.  No recent trauma or falls.  She denies any focal weakness or numbness.   On independent chart review, she was seen by nephrology on 26 March, has CKD stage IV, creatinine is 2.25 at baseline.  Physical Exam   Triage Vital Signs: ED Triage Vitals  Encounter Vitals Group     BP 04/03/23 1356 (!) 163/72     Systolic BP Percentile --      Diastolic BP Percentile --      Pulse Rate 04/03/23 1356 98     Resp 04/03/23 1356 16     Temp 04/03/23 1356 98 F (36.7 C)     Temp Source 04/03/23 1356 Oral     SpO2 04/03/23 1356 99 %     Weight 04/03/23 1357 110 lb (49.9 kg)     Height 04/03/23 1357 5\' 1"  (1.549 m)     Head Circumference --      Peak Flow --      Pain Score 04/03/23 1357 7     Pain Loc --      Pain Education --      Exclude from Growth Chart --     Most recent vital signs: Vitals:   04/03/23 1356  BP: (!) 163/72  Pulse: 98  Resp: 16  Temp: 98 F (36.7 C)  SpO2: 99%     General: Awake, no distress.  CV:  Good peripheral perfusion.  Resp:  Normal effort.   Clear Abd:  No distention.  Soft, tender in the left lower quadrant Other:  She has some left flank tenderness, left CVA tenderness.  She has no cranial nerve deficits, pupils are equal and reactive, extraocular movements are intact, no focal weakness or numbness.  No evidence of aphasia or dysarthria, patient states that her speech is normal for her now.  Dry mucous membranes.   ED Results / Procedures / Treatments   Labs (all labs ordered are listed, but only abnormal results are displayed) Labs Reviewed  LIPASE, BLOOD - Abnormal; Notable for the following components:      Result Value   Lipase 77 (*)    All other components within normal limits  COMPREHENSIVE METABOLIC PANEL WITH GFR - Abnormal; Notable for the following components:   Sodium 128 (*)    Chloride 97 (*)    CO2 18 (*)    Glucose, Bld 379 (*)    Creatinine, Ser 1.82 (*)    Calcium 8.6 (*)  Albumin 3.3 (*)    Alkaline Phosphatase 307 (*)    GFR, Estimated 30 (*)    All other components within normal limits  CBC - Abnormal; Notable for the following components:   RBC 3.85 (*)    Hemoglobin 11.7 (*)    HCT 33.5 (*)    All other components within normal limits  RESP PANEL BY RT-PCR (RSV, FLU A&B, COVID)  RVPGX2  BRAIN NATRIURETIC PEPTIDE  URINALYSIS, W/ REFLEX TO CULTURE (INFECTION SUSPECTED)  ETHANOL  TROPONIN I (HIGH SENSITIVITY)     RADIOLOGY Chest x-ray on my independent interpretation without obvious consolidation   PROCEDURES:  Critical Care performed: No  Procedures   MEDICATIONS ORDERED IN ED: Medications  lactated ringers bolus 1,000 mL (has no administration in time range)     IMPRESSION / MDM / ASSESSMENT AND PLAN / ED COURSE  I reviewed the triage vital signs and the nursing notes.                              Differential diagnosis includes, but is not limited to, diverticulitis, colitis, UTI, pyelonephritis, kidney stone, infected stone, dehydration, electrolyte derangements.   Also considered viral illness, ACS, pneumonia.  She does not appear fluid overloaded, less likely to be CHF.  Considered CVA but patient has no speech difficulty at this time, no focal deficits on exam.  Will get labs, EKG, troponin, chest x-ray, CT abdomen pelvis, IV fluids, Zofran.  Patient's presentation is most consistent with acute presentation with potential threat to life or bodily function.  Independent review of labs imaging are in the chart.  Patient signed out to oncoming team pending CT imaging, rest of labs, reassessment, the patient is feeling better and workup is negative, likely will be discharged home.  Clinical Course as of 04/03/23 1520  Sat Apr 03, 2023  1457 Independent review of labs, no leukocytosis, lipase is mildly elevated, she has some hyponatremia that is new, her creatinine is elevated but is downtrending compared to prior, her alk phos is elevated but this is downtrending compared to prior [TT]    Clinical Course User Index [TT] Claybon Jabs, MD     FINAL CLINICAL IMPRESSION(S) / ED DIAGNOSES   Final diagnoses:  Left lower quadrant abdominal pain  Dysuria  Nausea and vomiting, unspecified vomiting type  Shortness of breath  Cough, unspecified type  Chest tightness     Rx / DC Orders   ED Discharge Orders     None        Note:  This document was prepared using Dragon voice recognition software and may include unintentional dictation errors.    Claybon Jabs, MD 04/03/23 1520

## 2023-04-03 NOTE — Consult Note (Signed)
 Initial Consultation Note   Patient: Karen [name] MVH:[id number] DOB: [birth year] PCP: Miki Kins, FNP DOA: 04/03/2023 DOS: the patient was seen and examined on 04/03/2023 Primary service: Chesley Noon, MD  Referring physician: Dr. Larinda Buttery Reason for consult: UTI and urinary retention  Assessment/Plan:  # Acute Cystitis  Patient endorsing dysuria with nausea vomiting, with urinalysis concerning for acute cystitis.  - Urine culture pending.  Urine culture also obtained at  nephrology office visit 3 days prior - S/p Rocephin 1 g - EDP will discharge on Keflex  # Acute on Chronic Urinary Retention # Bilateral hydronephrosis  Per chart review, patient has a history of chronic intermittent urinary retention for which she follows with [facility] urology.  Per their office visit notes, etiology is felt to be due to uncontrolled type 2 diabetes.  I suspect her alcohol use disorder and frequent UTIs also contributing.  - Foley catheter placed.  Continue Foley catheter on discharge.given likely bladder stretch injury - Instructed patient to call her urologist office on Monday for a follow-up visit  # Generalized Weakness  Likely multifactorial in the setting of nausea/vomiting, poor p.o. intake, continued alcohol use.  She is ambulatory in the ED without difficulty, nor symptoms including dizziness, shortness of breath or chest pain.  She notes that she has a walker and cane at home that she can use if needed.  She has contacted family and friends to be there to support her, and has a friend that will stay with her overnight.  # Hyponatremia  Likely due to dehydration, poor p.o. intake.  Patient states that her appetite has already improved now that Foley catheter has been placed and she feels hungry.  Suspect hyponatremia will resolve as she improves her p.o. intake  # CKD Stage 3B w/ proteinuria Per chart review, patient has a history of CKD stage IIIb with creatinine ranging between 1.8 and  2.25.  Currently within baseline range.   Disposition: Patient states she feels much better after having Foley catheter placed and receiving IV fluids.  She feels that she will be able to improve p.o. intake at home, and has a support system.  She does not feel as though she needs to stay in the hospital.  No medical necessity for hospitalization at this time.   TRH will sign off at present, please call us again when needed.  HPI: Karen [name] is a 68 y.o. female with past medical history of CKD stage IIIb with proteinuria, chronic intermittent urinary retention, type 2 insulin-dependent uncontrolled diabetes, alcohol use disorder, hypertension, hyperlipidemia, who presents to the ED due to abdominal pain.  Karen [name] states that for the last 3 days, she has been having gradually worsening abdominal pain and distention with decreased urinary frequency.  During this time, she is also been experiencing nausea with occasional vomiting, but states that vomit is mostly water.  She endorses dysuria.  She denies any new or worsening shortness of breath or cough.  She endorses generalized weakness but has still been able to ambulate around her home without difficulty or falls.  She denies any focal weakness.  ED course: On arrival to the ED, patient was hypertensive at 163/72 with heart rate of 98.  She was saturating at 99% on room air.  She was afebrile at 98.  Initial workup notable for hemoglobin of 11.7, sodium 128, bicarb 18, glucose 379, BUN 22, creat 1.82, alkaline phosphatase 307, AST 40, ALT 30, GFR 30.  Troponin and BNP within normal  limits.  Urinalysis with large leukocytes, small hematuria, rare bacteria.  Alcohol level 87.  CT of the abdomen demonstrated significant bladder distention with associated bilateral hydronephrosis.  Foley catheter was placed.  Patient started on Rocephin.  TRH contacted for admission.  Review of Systems: As mentioned in the history of present illness. All other systems  reviewed and are negative.  Past Medical History:  Diagnosis Date   Abdominal pain    Acute cystitis 05/18/2020   Acute kidney injury superimposed on CKD (HCC)    Acute on chronic pancreatitis (HCC) 01/03/2017   Acute renal insufficiency 10/29/2015   Adhesive capsulitis of left shoulder    Alcoholic pancreatitis    Asthma    Depression    Diabetes mellitus without complication (HCC)    Diabetic cataract of right eye (HCC) 08/31/2019   Formatting of this note might be different from the original. Added automatically from request for surgery [id number]   DKA (diabetic ketoacidoses) 02/17/2015   DKA (diabetic ketoacidosis) (HCC) 02/17/2015   IMO SNOMED Dx Update Oct 2024     Elevated alkaline phosphatase level 10/30/2021   Emphysematous cystitis    Esophageal candidiasis (HCC)    ETOH abuse 01/03/2017   Headache 02/26/2019   Hypercholesteremia    Hypertension    Hypertensive urgency 04/21/2016   Hypokalemia    Left flank pain 10/29/2015   Left upper quadrant abdominal pain 05/18/2020   Leukocytosis 06/13/2019   Malignant essential hypertension 10/29/2015   Upper GI bleed 11/11/2017   UTI (urinary tract infection) 01/24/2018   Past Surgical History:  Procedure Laterality Date   ABDOMINAL HYSTERECTOMY  1996   APPENDECTOMY  1997   ESOPHAGOGASTRODUODENOSCOPY N/A 11/12/2017   Procedure: ESOPHAGOGASTRODUODENOSCOPY (EGD);  Surgeon: Toney Reil, MD;  Location: [facility] ENDOSCOPY;  Service: Gastroenterology;  Laterality: N/A;   ESOPHAGOGASTRODUODENOSCOPY N/A 09/29/2018   Procedure: ESOPHAGOGASTRODUODENOSCOPY (EGD);  Surgeon: Toledo, Boykin Nearing, MD;  Location: [facility] ENDOSCOPY;  Service: Gastroenterology;  Laterality: N/A;   ESOPHAGOGASTRODUODENOSCOPY (EGD) WITH PROPOFOL N/A 04/24/2016   Procedure: ESOPHAGOGASTRODUODENOSCOPY (EGD) WITH PROPOFOL;  Surgeon: Midge Minium, MD;  Location: [facility] ENDOSCOPY;  Service: Endoscopy;  Laterality: N/A;   ESOPHAGOGASTRODUODENOSCOPY (EGD) WITH PROPOFOL N/A  01/28/2019   Procedure: ESOPHAGOGASTRODUODENOSCOPY (EGD) WITH PROPOFOL;  Surgeon: Toledo, Boykin Nearing, MD;  Location: [facility] ENDOSCOPY;  Service: Gastroenterology;  Laterality: N/A;   EYE SURGERY     HAND SURGERY  1988   THYROID SURGERY  2013   Social History:  reports that she has quit smoking. Her smoking use included cigarettes. She has never used smokeless tobacco. She reports current alcohol use. She reports that she does not use drugs.  Allergies  Allergen Reactions   Metoclopramide Other (See Comments) and Shortness Of Breath    Hypotension, shortness of breath  Hypotension, shortness of breath  Hypotension, shortness of breath  Hypotension, shortness of breath    Other Reaction(s): Other (See Comments)    Hypotension, shortness of breath  Hypotension, shortness of breath Hypotension, shortness of breath   Penicillins Anaphylaxis, Rash and Swelling    Has patient had a PCN reaction causing immediate rash, facial/tongue/throat swelling, SOB or lightheadedness with hypotension: Yes  Has patient had a PCN reaction causing severe rash involving mucus membranes or skin necrosis: No  Has patient had a PCN reaction that required hospitalization No  Has patient had a PCN reaction occurring within the last 10 years: No  If all of the above answers are "NO", then may proceed with Cephalosporin use.  Has patient had  a PCN reaction causing immediate rash, facial/tongue/throat swelling, SOB or lightheadedness with hypotension: Yes  Has patient had a PCN reaction causing severe rash involving mucus membranes or skin necrosis: No  Has patient had a PCN reaction that required hospitalization No  Has patient had a PCN reaction occurring within the last 10 years: No  If all of the above answers are "NO", then may proceed with Cephalosporin use.  Has patient had a PCN reaction causing immediate rash, facial/tongue/throat swelling, SOB or lightheadedness with hypotension: Yes  Has patient had a  PCN reaction causing severe rash involving mucus membranes or skin necrosis: No  Has patient had a PCN reaction that required hospitalization No  Has patient had a PCN reaction occurring within the last 10 years: No  If all of the above answers are "NO", then may proceed with Cephalosporin use.  Has patient had a PCN reaction causing immediate rash, facial/tongue/throat swelling, SOB or lightheadedness with hypotension: Yes Has patient had a PCN reaction causing severe rash involving mucus membranes or skin necrosis: No Has patient had a PCN reaction that required hospitalization No Has patient had a PCN reaction occurring within the last 10 years: No If all of the above answers are "NO", then may proceed with Cephalosporin use.   Fentanyl Rash    Rash   Other Rash    Bee sting   Bee sting  Bee sting    Bee sting   Bee sting   Oxycodone-Acetaminophen Rash    plain tylenol can also make itch?  plain tylenol can also make itch?  plain tylenol can also make itch?  plain tylenol can also make itch?    plain tylenol can also make itch?  plain tylenol can also make itch? plain tylenol can also make itch?   Percocet [Oxycodone-Acetaminophen] Rash    plain tylenol can also make itch?    Family History  Problem Relation Age of Onset   Diabetes Mother     Prior to Admission medications   Medication Sig Start Date End Date Taking? Authorizing Provider  amLODipine (NORVASC) 10 MG tablet Take 1 tablet (10 mg total) by mouth daily. for blood pressure 01/12/23   Miki Kins, FNP  ARIPiprazole (ABILIFY) 5 MG tablet Take 1 tablet (5 mg total) by mouth daily. 11/17/19   Pahwani, [name], MD  Bempedoic Acid-Ezetimibe (NEXLIZET) 180-10 MG TABS TAKE 1 TABLET BY MOUTH AT BEDTIME FOR CHOLESTEROL 06/26/22   Miki Kins, FNP  cetirizine (ZYRTEC) 5 MG tablet Take 1 tablet (5 mg total) by mouth daily. 11/17/19 12/17/19  Pahwani, [name], MD  cyclobenzaprine (FLEXERIL) 5 MG tablet TAKE 1 TABLET BY  MOUTH UP TO TWICE A DAYAS NEEDED FOR NECK AND BACK PAIN 12/02/22   Miki Kins, FNP  diazepam (VALIUM) 2 MG tablet TAKE 1 TABLET BY MOUTH EVERY 12 HOURS ASNEEDED FOR MUSCLE SPASMS 03/31/23   Miki Kins, FNP  docusate sodium (COLACE) 100 MG capsule TAKE 1 CAPSULE BY MOUTH TWICE DAILY 10/06/22   Miki Kins, FNP  famotidine (PEPCID) 20 MG tablet TAKE 1 TABLET BY MOUTH TWICE DAILY 03/26/23   Miki Kins, FNP  hydrochlorothiazide (HYDRODIURIL) 12.5 MG tablet TAKE 1 TABLET BY MOUTH ONCE DAILY FOR SWELLING IN LEGS 02/16/22   Miki Kins, FNP  HYDROcodone-acetaminophen (NORCO/VICODIN) 5-325 MG tablet TAKE 1 TABLET BY MOUTH 4 TIMES DAILY AS NEEDED FOR PAIN 03/12/23   Miki Kins, FNP  hydrOXYzine (ATARAX) 25 MG tablet TAKE 1  TABLET BY MOUTH TWICE DAILY AS NEEDED UNTIL RASH GOES AWAY 02/13/23   Miki Kins, FNP  insulin aspart (NOVOLOG) 100 UNIT/ML FlexPen Inject 2 Units into the skin 3 (three) times daily with meals. 11/17/19   Pahwani, [name], MD  insulin aspart protamine - aspart (NOVOLOG MIX 70/30 FLEXPEN) (70-30) 100 UNIT/ML FlexPen INJECT 15 UNITS SUBCUTANEOUSLY ONCE EVERY MORNING AS DIRECTED *STOP OTHER INSULINS 03/26/23   Miki Kins, FNP  insulin glargine (LANTUS SOLOSTAR) 100 UNIT/ML Solostar Pen INJECT 20 UNITS SUBCUTANEOUSLY AT BEDTIME 07/01/22   Miki Kins, FNP  insulin glargine (LANTUS) 100 UNIT/ML injection Inject 0.15 mLs (15 Units total) into the skin at bedtime. 05/24/20   Charise Killian, MD  Insulin Pen Needle 29G X MISC 1 Dose by Does not apply route 3 (three) times daily before meals. 06/18/19   Alford Highland, MD  nortriptyline (PAMELOR) 25 MG capsule TAKE 1 CAPSULE BY MOUTH AT BEDTIME 09/22/22   Miki Kins, FNP  omeprazole (PRILOSEC) 40 MG capsule TAKE 1 CAPSULE BY MOUTH TWICE DAILY FOR REFULX 05/30/22   Miki Kins, FNP  ondansetron (ZOFRAN ODT) 4 MG disintegrating tablet Take 1 tablet (4 mg total) by mouth every 8  (eight) hours as needed for nausea or vomiting. 08/25/22   Miki Kins, FNP  Pancrelipase, Lip-Prot-Amyl, (ZENPEP) 40000-126000 units CPEP TAKE 2 CAPSULES BY MOUTH WITH MEALS AND 1 WITH SNACKS 12/01/22   Miki Kins, FNP  Polyethylene Glycol 3350 POWD Take 17 g by mouth at bedtime. Mix 17 grams in 4-8 oz of liquid and drink     [provider]  pregabalin (LYRICA) 200 MG capsule TAKE 1 CAPSULE BY MOUTH TWICE DAILY 07/08/22   Miki Kins, FNP  rosuvastatin (CRESTOR) 20 MG tablet TAKE 1 TABLET BY MOUTH ONCE EVERY EVENING 03/26/23   Miki Kins, FNP  tirzepatide [facility]) 5 MG/0.5ML Pen Inject 5 mg into the skin once a week. 01/12/23   Miki Kins, FNP  triamcinolone cream (KENALOG) 0.5 % Apply twice a day to back of thighs 06/18/19   Alford Highland, MD  VASCEPA 0.5 g CAPS Take 4 capsules by mouth in the morning and at bedtime. 11/17/19   Pahwani, [name], MD  zolpidem (AMBIEN) 10 MG tablet TAKE 1 TABLET BY MOUTH AT BEDTIME AS NEEDED FOR INSOMNIA 01/26/23   Miki Kins, FNP  GLUCERNA (GLUCERNA) LIQD Take 237 mLs by mouth 3 (three) times daily between meals. 02/19/15 02/27/15  Adrian Saran, MD    Physical Exam: Vitals:   04/03/23 1356 04/03/23 1357 04/03/23 1700  BP: (!) 163/72  (!) 160/78  Pulse: 98  78  Resp: 16  18  Temp: 98 F (36.7 C)    TempSrc: Oral    SpO2: 99%  100%  Weight:  49.9 kg   Height:  5\' 1"  (1.549 m)    Physical Exam Vitals and nursing note reviewed.  Constitutional:      Appearance: She is normal weight.  HENT:     Head: Normocephalic and atraumatic.     Mouth/Throat:     Mouth: Mucous membranes are dry.     Pharynx: Oropharynx is clear.  Eyes:     Conjunctiva/sclera: Conjunctivae normal.     Pupils: Pupils are equal, round, and reactive to light.  Cardiovascular:     Rate and Rhythm: Normal rate and regular rhythm.     Heart sounds: No murmur heard. Pulmonary:     Effort: Pulmonary  effort is normal. No respiratory  distress.     Breath sounds: Normal breath sounds. No wheezing, rhonchi or rales.  Abdominal:     General: Bowel sounds are normal. There is no distension.     Palpations: Abdomen is soft.     Tenderness: There is no abdominal tenderness. There is no guarding.  Musculoskeletal:     Right lower leg: No edema.     Left lower leg: No edema.  Skin:    General: Skin is warm and dry.  Neurological:     General: No focal deficit present.     Mental Status: She is alert and oriented to person, place, and time.     Comments: Walked with patient in room. Gait intact with no ataxia or focal weakness.   Psychiatric:        Mood and Affect: Mood normal.        Behavior: Behavior normal.    Data Reviewed:  CBC with WBC of 9.9, hemoglobin of 11.7, MCV of 87, platelets of 269 CMP with sodium of 128, potassium 3.7, bicarb 18, glucose 379, BUN 22, creatinine 0.82, alkaline phosphatase 307, albumin 3.3, AST 40, ALT 30, GFR of 30 Lipase 77 BNP 26 Troponin 7 Urinalysis with small hematuria, glucosuria, large leukocytes, proteinuria, rare bacteria over 50 WBC/hpf Alcohol level 87 COVID #19, influenza and RSV PCR negative  EKG personally reviewed.  Sinus rhythm with rate of 84.  No acute ischemic changes.  CT ABDOMEN PELVIS WO CONTRAST Result Date: 04/03/2023 CLINICAL DATA:  Left lower quadrant abdominal pain extending to the back. Dysuria. EXAM: CT ABDOMEN AND PELVIS WITHOUT CONTRAST TECHNIQUE: Multidetector CT imaging of the abdomen and pelvis was performed following the standard protocol without IV contrast. RADIATION DOSE REDUCTION: This exam was performed according to the departmental dose-optimization program which includes automated exposure control, adjustment of the mA and/or kV according to patient size and/or use of iterative reconstruction technique. COMPARISON:  Abdominopelvic CT 09/18/2022 and 06/10/2020. FINDINGS: Lower chest: Patchy ground-glass opacities at both lung bases appear chronic  and similar to previous CT. No confluent airspace disease, significant pleural or pericardial effusion. Stable small hiatal hernia. Hepatobiliary: The liver appears unremarkable as imaged in the noncontrast state. No evidence of gallstones, gallbladder wall thickening or biliary dilatation. Pancreas: Unremarkable. No pancreatic ductal dilatation or surrounding inflammatory changes. Spleen: Normal in size without focal abnormality. Adrenals/Urinary Tract: Both adrenal glands appear normal. Punctate nonobstructing calculus in the upper pole of the right kidney. There is new mild hydronephrosis and hydroureter (right greater than left), likely related to moderate bladder distention. No evidence of ureteral calculus or focal bladder abnormality. No significant perinephric soft tissue stranding or focal fluid collection. Stomach/Bowel: No enteric contrast administered. The stomach appears unremarkable for its degree of distension. No evidence of bowel wall thickening, distention or surrounding inflammatory change. Surgical clips adjacent to the cecum consistent with previous appendectomy. Scattered colonic diverticulosis without acute inflammation. Vascular/Lymphatic: There are no enlarged abdominal or pelvic lymph nodes. Aortic and branch vessel atherosclerosis without evidence of aneurysm. Reproductive: Status post hysterectomy. No evidence of adnexal mass. Other: No evidence of abdominal wall mass or hernia. No ascites or pneumoperitoneum. Musculoskeletal: No acute or significant osseous findings. Unchanged nonaggressive fatty lesion distally within the right psoas muscle. IMPRESSION: 1. Significant bladder distension with associated bilateral hydronephrosis and hydroureter (right greater than left). Correlate clinically for signs of bladder outlet obstruction or bladder dysfunction. No evidence of ureteral calculus or focal bladder abnormality. 2. Punctate nonobstructing right  renal calculus. 3. Colonic  diverticulosis without evidence of acute inflammation. 4.  Aortic Atherosclerosis (ICD10-I70.0). Electronically Signed   By: Carey Bullocks M.D.   On: 04/03/2023 15:54   DG Chest 2 View Result Date: 04/03/2023 CLINICAL DATA:  Chest pain and cough. Left lower quadrant abdominal pain radiating into the back. EXAM: CHEST - 2 VIEW COMPARISON:  Chest radiographs 10/06/2020. CT of the chest, abdomen and pelvis 01/24/2019. FINDINGS: The heart size and mediastinal contours are normal. The right paratracheal surgical clips from previous right thyroid lobectomy. The lungs are clear. There is no pleural effusion or pneumothorax. No acute osseous findings are evident. Telemetry leads overlie the chest. IMPRESSION: No evidence of acute cardiopulmonary process. Previous right thyroid lobectomy. Electronically Signed   By: Carey Bullocks M.D.   On: 04/03/2023 14:57   There are no new results to review at this time.   Family Communication: No family at bedside Primary team communication: Discussed with Dr. Larinda Buttery  Thank you very much for involving [place] in the care of your patient.  Author: Verdene Lennert, MD 04/03/2023 6:09 PM  For on call review [email or web address].

## 2023-04-03 NOTE — ED Triage Notes (Signed)
 Patient is having left lower abdominal pain that wraps around to left back. Reports hard to urinate and painful. Reports kidney issues.   Patient reports feeling like her speech is off. Reports bilateral leg weakness and bilateral hand numbness. States her LKW was last night when she went to bed around 6:30pm.   Bilateral, strong hand grips. No facial droop noted.

## 2023-04-03 NOTE — ED Provider Notes (Addendum)
-----------------------------------------   3:06 PM on 04/03/2023 -----------------------------------------  Blood pressure (!) 163/72, pulse 98, temperature 98 F (36.7 C), temperature source Oral, resp. rate 16, height 5\' 1"  (1.549 m), weight 49.9 kg, SpO2 99%.  Assuming care from Dr. Jodie Echevaria.  In short, Karen [name] is a 68 y.o. female with a chief complaint of Dysuria and Abdominal Pain .  Refer to the original H&P for additional details.  The current plan of care is to follow-up labs and reassess following IVF.  ----------------------------------------- 5:13 PM on 04/03/2023 ----------------------------------------- CT imaging shows significant distention of the bladder with associated hydronephrosis, no stone or other obstructing process noted.  Patient does state that she has had trouble emptying her bladder recently, bladder scan shows greater than 800 cc of urine.  Given urinary retention, Foley catheter was placed and urinalysis concerning for infection.  Urine was sent for culture and we will treat with IV Rocephin.  Given patient's ongoing abdominal pain with generalized weakness, case discussed with hospitalist for admission.  ----------------------------------------- 6:14 PM on 04/03/2023 ----------------------------------------- Patient evaluated by Dr. Huel Cote of the hospitalist service, now stating that she has friends and family available at home to assist her with management of Foley catheter and ADLs.  She was able to ambulate here in the ED with minimal assistance and is appropriate for discharge home given no findings concerning for sepsis or worsening renal function.  She was counseled to follow-up with urology at [facility], whom she has seen previously for urinary retention.  She was counseled to return to the ED for new or worsening symptoms.  Patient agrees with plan.     Chesley Noon, MD 04/03/23 1714    Chesley Noon, MD 04/03/23 [phone]

## 2023-04-03 NOTE — ED Notes (Signed)
 Pt to CT

## 2023-04-05 LAB — URINE CULTURE: Culture: >=100000 — AB

## 2023-04-08 ENCOUNTER — Other Ambulatory Visit: Payer: Self-pay | Admitting: Family

## 2023-04-12 ENCOUNTER — Encounter: Payer: Self-pay | Admitting: Family

## 2023-04-12 ENCOUNTER — Ambulatory Visit: Payer: Medicaid Other | Admitting: Family

## 2023-04-12 VITALS — BP 126/70 | HR 89 | Ht 61.0 in | Wt 102.4 lb

## 2023-04-12 DIAGNOSIS — E1169 Type 2 diabetes mellitus with other specified complication: Secondary | ICD-10-CM

## 2023-04-12 DIAGNOSIS — N1831 Chronic kidney disease, stage 3a: Secondary | ICD-10-CM

## 2023-04-12 DIAGNOSIS — R5383 Other fatigue: Secondary | ICD-10-CM

## 2023-04-12 DIAGNOSIS — E538 Deficiency of other specified B group vitamins: Secondary | ICD-10-CM

## 2023-04-12 DIAGNOSIS — E1165 Type 2 diabetes mellitus with hyperglycemia: Secondary | ICD-10-CM

## 2023-04-12 DIAGNOSIS — E1122 Type 2 diabetes mellitus with diabetic chronic kidney disease: Secondary | ICD-10-CM

## 2023-04-12 DIAGNOSIS — N1832 Chronic kidney disease, stage 3b: Secondary | ICD-10-CM

## 2023-04-12 DIAGNOSIS — E559 Vitamin D deficiency, unspecified: Secondary | ICD-10-CM | POA: Diagnosis not present

## 2023-04-12 DIAGNOSIS — Z794 Long term (current) use of insulin: Secondary | ICD-10-CM

## 2023-04-12 DIAGNOSIS — E782 Mixed hyperlipidemia: Secondary | ICD-10-CM | POA: Diagnosis not present

## 2023-04-12 DIAGNOSIS — Z0130 Encounter for examination of blood pressure without abnormal findings: Secondary | ICD-10-CM

## 2023-04-12 NOTE — Assessment & Plan Note (Signed)
 Checking labs today. Will call pt. With results  Continue current diabetes POC, as patient has been well controlled on current regimen.  Will adjust meds if needed based on labs.

## 2023-04-12 NOTE — Progress Notes (Signed)
 Established Patient Office Visit  Subjective:  Patient ID: Karen [name], female    DOB: November 06, 1955  Age: 68 y.o. MRN: [id number]  Chief Complaint  Patient presents with   Follow-up    3 month follow up    Patient is here today for her 3 months follow up.  She has been feeling fairly well since last appointment.   She does not have additional concerns to discuss today.  Labs are due today. She needs refills.   I have reviewed her active problem list, medication list, allergies, notes from last encounter, lab results for her appointment today.      No other concerns at this time.   Past Medical History:  Diagnosis Date   Abdominal pain    Acute cystitis 05/18/2020   Acute kidney injury superimposed on CKD (HCC)    Acute on chronic pancreatitis (HCC) 01/03/2017   Acute renal insufficiency 10/29/2015   Adhesive capsulitis of left shoulder    Alcoholic pancreatitis    Asthma    Depression    Diabetes mellitus without complication (HCC)    Diabetic cataract of right eye (HCC) 08/31/2019   Formatting of this note might be different from the original. Added automatically from request for surgery [id number]   DKA (diabetic ketoacidoses) 02/17/2015   DKA (diabetic ketoacidosis) (HCC) 02/17/2015   IMO SNOMED Dx Update Oct 2024     Elevated alkaline phosphatase level 10/30/2021   Emphysematous cystitis    Esophageal candidiasis (HCC)    ETOH abuse 01/03/2017   Headache 02/26/2019   Hypercholesteremia    Hypertension    Hypertensive urgency 04/21/2016   Hypokalemia    Left flank pain 10/29/2015   Left upper quadrant abdominal pain 05/18/2020   Leukocytosis 06/13/2019   Malignant essential hypertension 10/29/2015   Upper GI bleed 11/11/2017   UTI (urinary tract infection) 01/24/2018    Past Surgical History:  Procedure Laterality Date   ABDOMINAL HYSTERECTOMY  1996   APPENDECTOMY  1997   ESOPHAGOGASTRODUODENOSCOPY N/A 11/12/2017   Procedure: ESOPHAGOGASTRODUODENOSCOPY  (EGD);  Surgeon: Toney Reil, MD;  Location: [facility] ENDOSCOPY;  Service: Gastroenterology;  Laterality: N/A;   ESOPHAGOGASTRODUODENOSCOPY N/A 09/29/2018   Procedure: ESOPHAGOGASTRODUODENOSCOPY (EGD);  Surgeon: Toledo, Boykin Nearing, MD;  Location: [facility] ENDOSCOPY;  Service: Gastroenterology;  Laterality: N/A;   ESOPHAGOGASTRODUODENOSCOPY (EGD) WITH PROPOFOL N/A 04/24/2016   Procedure: ESOPHAGOGASTRODUODENOSCOPY (EGD) WITH PROPOFOL;  Surgeon: Midge Minium, MD;  Location: [facility] ENDOSCOPY;  Service: Endoscopy;  Laterality: N/A;   ESOPHAGOGASTRODUODENOSCOPY (EGD) WITH PROPOFOL N/A 01/28/2019   Procedure: ESOPHAGOGASTRODUODENOSCOPY (EGD) WITH PROPOFOL;  Surgeon: Toledo, Boykin Nearing, MD;  Location: [facility] ENDOSCOPY;  Service: Gastroenterology;  Laterality: N/A;   EYE SURGERY     HAND SURGERY  1988   THYROID SURGERY  2013    Social History   Socioeconomic History   Marital status: Single    Spouse name: Not on file   Number of children: Not on file   Years of education: Not on file   Highest education level: Not on file  Occupational History   Occupation: disabled  Tobacco Use   Smoking status: Former    Types: Cigarettes   Smokeless tobacco: Never   Tobacco comments:    quit 7 years ago   Vaping Use   Vaping status: Never Used  Substance and Sexual Activity   Alcohol use: Yes    Comment: rare   Drug use: No   Sexual activity: Not on file  Other Topics Concern   Not on  file  Social History Narrative   Not on file   Social Drivers of Health   Financial Resource Strain: Low Risk  (10/31/2021)   Received from [facility], [facility]   Overall Financial Resource Strain (CARDIA)    Difficulty of Paying Living Expenses: Not hard at all  Food Insecurity: No Food Insecurity (10/31/2021)   Received from [facility], [facility]   Hunger Vital Sign    Worried About Running Out of Food in the Last Year: Never true    Ran Out of Food in the Last Year: Never true   Transportation Needs: Unmet Transportation Needs (10/31/2021)   Received from [facility], [facility]   PRAPARE - Transportation    Lack of Transportation (Medical): Yes    Lack of Transportation (Non-Medical): Yes  Physical Activity: Not on file  Stress: Not on file  Social Connections: Not on file  Intimate Partner Violence: Not on file    Family History  Problem Relation Age of Onset   Diabetes Mother     Allergies  Allergen Reactions   Metoclopramide Other (See Comments) and Shortness Of Breath    Hypotension, shortness of breath  Hypotension, shortness of breath  Hypotension, shortness of breath  Hypotension, shortness of breath    Other Reaction(s): Other (See Comments)    Hypotension, shortness of breath  Hypotension, shortness of breath Hypotension, shortness of breath  Shortness of Breath, Hypotension   Penicillins Anaphylaxis, Rash and Swelling    Has patient had a PCN reaction causing immediate rash, facial/tongue/throat swelling, SOB or lightheadedness with hypotension: Yes  Has patient had a PCN reaction causing severe rash involving mucus membranes or skin necrosis: No  Has patient had a PCN reaction that required hospitalization No  Has patient had a PCN reaction occurring within the last 10 years: No  If all of the above answers are "NO", then may proceed with Cephalosporin use.  Has patient had a PCN reaction causing immediate rash, facial/tongue/throat swelling, SOB or lightheadedness with hypotension: Yes  Has patient had a PCN reaction causing severe rash involving mucus membranes or skin necrosis: No  Has patient had a PCN reaction that required hospitalization No  Has patient had a PCN reaction occurring within the last 10 years: No  If all of the above answers are "NO", then may proceed with Cephalosporin use.  Has patient had a PCN reaction causing immediate rash, facial/tongue/throat swelling, SOB or lightheadedness with hypotension:  Yes  Has patient had a PCN reaction causing severe rash involving mucus membranes or skin necrosis: No  Has patient had a PCN reaction that required hospitalization No  Has patient had a PCN reaction occurring within the last 10 years: No  If all of the above answers are "NO", then may proceed with Cephalosporin use.  Has patient had a PCN reaction causing immediate rash, facial/tongue/throat swelling, SOB or lightheadedness with hypotension: Yes Has patient had a PCN reaction causing severe rash involving mucus membranes or skin necrosis: No Has patient had a PCN reaction that required hospitalization No Has patient had a PCN reaction occurring within the last 10 years: No If all of the above answers are "NO", then may proceed with Cephalosporin use.   Fentanyl Rash    Rash   Other Rash    Bee sting   Bee sting  Bee sting    Bee sting   Bee sting   Oxycodone-Acetaminophen Rash    plain tylenol can  also make itch?  plain tylenol can also make itch?  plain tylenol can also make itch?  plain tylenol can also make itch?    plain tylenol can also make itch?  plain tylenol can also make itch? plain tylenol can also make itch?   Percocet [Oxycodone-Acetaminophen] Rash    plain tylenol can also make itch?    Review of Systems  All other systems reviewed and are negative.      Objective:   BP 126/70   Pulse 89   Ht 5\' 1"  (1.549 m)   Wt 102 lb 6.4 oz (46.4 kg)   SpO2 98%   BMI 19.35 kg/m   Vitals:   04/12/23 0935  BP: 126/70  Pulse: 89  Height: 5\' 1"  (1.549 m)  Weight: 102 lb 6.4 oz (46.4 kg)  SpO2: 98%  BMI (Calculated): 19.36    Physical Exam   No results found for any visits on 04/12/23.  Recent Results (from the past 2160 hours)  Lipid panel     Status: Abnormal   Collection Time: 01/12/23  2:59 PM  Result Value Ref Range   Cholesterol, Total 184 100 - 199 mg/dL   Triglycerides 865 (H) 0 - 149 mg/dL   HDL 68 >78 mg/dL   VLDL Cholesterol Cal 38 5 - 40 mg/dL    LDL Chol Calc (NIH) 78 0 - 99 mg/dL   Chol/HDL Ratio 2.7 0.0 - 4.4 ratio    Comment:                                   T. Chol/HDL Ratio                                             Men  Women                               1/2 Avg.Risk  3.4    3.3                                   Avg.Risk  5.0    4.4                                2X Avg.Risk  9.6    7.1                                3X Avg.Risk 23.4   11.0   VITAMIN D 25 Hydroxy (Vit-D Deficiency, Fractures)     Status: Abnormal   Collection Time: 01/12/23  2:59 PM  Result Value Ref Range   Vit D, 25-Hydroxy 29.6 (L) 30.0 - 100.0 ng/mL    Comment: Vitamin D deficiency has been defined by the Institute of Medicine and an Endocrine Society practice guideline as a level of serum 25-OH vitamin D less than 20 ng/mL (1,2). The Endocrine Society went on to further define vitamin D insufficiency as a level between 21 and 29 ng/mL (2). 1. IOM (Institute of Medicine). 2010. Dietary reference    intakes for calcium and D. [place]  [place]: The    [employer]. 2. Holick MF, Binkley [place], Bischoff-Ferrari HA, et al.    Evaluation, treatment, and prevention of vitamin D    deficiency: an Endocrine Society clinical practice    guideline. JCEM. 2011 Jul; 96(7):1911-30.   CMP14+EGFR     Status: Abnormal   Collection Time: 01/12/23  2:59 PM  Result Value Ref Range   Glucose 189 (H) 70 - 99 mg/dL   BUN 46 (H) 8 - 27 mg/dL   Creatinine, Ser 1.47 (H) 0.57 - 1.00 mg/dL   eGFR 25 (L) >82 NF/AOZ/3.08   BUN/Creatinine Ratio 21 12 - 28   Sodium 137 134 - 144 mmol/L   Potassium 4.9 3.5 - 5.2 mmol/L   Chloride 99 96 - 106 mmol/L   CO2 25 20 - 29 mmol/L   Calcium 9.6 8.7 - 10.3 mg/dL   Total Protein 6.8 6.0 - 8.5 g/dL   Albumin 4.0 3.9 - 4.9 g/dL   Globulin, Total 2.8 1.5 - 4.5 g/dL   Bilirubin Total <6.5 0.0 - 1.2 mg/dL   Alkaline Phosphatase 495 (H) 44 - 121 IU/L   AST 52 (H) 0 - 40 IU/L   ALT 57 (H) 0 - 32 IU/L  TSH     Status: None    Collection Time: 01/12/23  2:59 PM  Result Value Ref Range   TSH 2.440 0.450 - 4.500 uIU/mL  Hemoglobin A1c     Status: Abnormal   Collection Time: 01/12/23  2:59 PM  Result Value Ref Range   Hgb A1c MFr Bld 10.5 (H) 4.8 - 5.6 %    Comment:          Prediabetes: 5.7 - 6.4          Diabetes: >6.4          Glycemic control for adults with diabetes: <7.0    Est. average glucose Bld gHb Est-mCnc 255 mg/dL  Vitamin H84     Status: None   Collection Time: 01/12/23  2:59 PM  Result Value Ref Range   Vitamin B-12 781 232 - 1,245 pg/mL  CBC with Diff     Status: Abnormal   Collection Time: 01/12/23  2:59 PM  Result Value Ref Range   WBC 10.3 3.4 - 10.8 x10E3/uL   RBC 4.04 3.77 - 5.28 x10E6/uL   Hemoglobin 12.1 11.1 - 15.9 g/dL   Hematocrit 69.6 29.5 - 46.6 %   MCV 91 79 - 97 fL   MCH 30.0 26.6 - 33.0 pg   MCHC 33.1 31.5 - 35.7 g/dL   RDW 28.4 13.2 - 44.0 %   Platelets 267 150 - 450 x10E3/uL   Neutrophils 56 Not Estab. %   Lymphs 31 Not Estab. %   Monocytes 9 Not Estab. %   Eos 2 Not Estab. %   Basos 1 Not Estab. %   Neutrophils Absolute 5.7 1.4 - 7.0 x10E3/uL   Lymphocytes Absolute 3.2 (H) 0.7 - 3.1 x10E3/uL   Monocytes Absolute 1.0 (H) 0.1 - 0.9 x10E3/uL   EOS (ABSOLUTE) 0.2 0.0 - 0.4 x10E3/uL   Basophils Absolute 0.1 0.0 - 0.2 x10E3/uL   Immature Granulocytes 1 Not Estab. %   Immature Grans (Abs) 0.1 0.0 - 0.1 x10E3/uL  Lipase, blood     Status: Abnormal   Collection Time: 04/03/23  1:59 PM  Result Value Ref Range   Lipase 77 (H) 11 - 51 U/L    Comment: Performed at [facility], [address] [place], [place] [address]  Comprehensive metabolic panel     Status: Abnormal   Collection Time: 04/03/23  1:59 PM  Result Value Ref Range   Sodium 128 (L) 135 - 145 mmol/L   Potassium 3.7 3.5 - 5.1 mmol/L   Chloride 97 (L) 98 - 111 mmol/L   CO2 18 (L) 22 - 32 mmol/L   Glucose, Bld 379 (H) 70 - 99 mg/dL    Comment: Glucose reference range applies only to samples taken  after fasting for at least 8 hours.   BUN 22 8 - 23 mg/dL   Creatinine, Ser 6.04 (H) 0.44 - 1.00 mg/dL   Calcium 8.6 (L) 8.9 - 10.3 mg/dL   Total Protein 7.1 6.5 - 8.1 g/dL   Albumin 3.3 (L) 3.5 - 5.0 g/dL   AST 40 15 - 41 U/L   ALT 30 0 - 44 U/L   Alkaline Phosphatase 307 (H) 38 - 126 U/L   Total Bilirubin 0.2 0.0 - 1.2 mg/dL   GFR, Estimated 30 (L) >60 mL/min    Comment: (NOTE) Calculated using the CKD-EPI Creatinine Equation (2021)    Anion gap 13 5 - 15    Comment: Performed at [facility], [address] [place], [place] [address]  CBC     Status: Abnormal   Collection Time: 04/03/23  1:59 PM  Result Value Ref Range   WBC 9.9 4.0 - 10.5 K/uL   RBC 3.85 (L) 3.87 - 5.11 MIL/uL   Hemoglobin 11.7 (L) 12.0 - 15.0 g/dL   HCT 11.9 (L) 14.7 - 82.9 %   MCV 87.0 80.0 - 100.0 fL   MCH 30.4 26.0 - 34.0 pg   MCHC 34.9 30.0 - 36.0 g/dL   RDW 56.2 13.0 - 86.5 %   Platelets 269 150 - 400 K/uL   nRBC 0.0 0.0 - 0.2 %    Comment: Performed at [facility], [address] [place], [place] [address]  Brain natriuretic peptide     Status: None   Collection Time: 04/03/23  1:59 PM  Result Value Ref Range   B Natriuretic Peptide 26.7 0.0 - 100.0 pg/mL    Comment: Performed at [facility], [address] [place], [place] [address]  Troponin I (High Sensitivity)     Status: None   Collection Time: 04/03/23  1:59 PM  Result Value Ref Range   Troponin I (High Sensitivity) 7 <18 ng/L    Comment: (NOTE) Elevated high sensitivity troponin I (hsTnI) values and significant  changes across serial measurements may suggest ACS but many other  chronic and acute conditions are known to elevate hsTnI results.  Refer to the "Links" section for chest pain algorithms and additional  guidance. Performed at [facility], [address] [place], [place] [address]   Resp panel by RT-PCR (RSV, Flu A&B, Covid) Anterior Nasal Swab     Status: None   Collection Time:  04/03/23  3:07 PM   Specimen: Anterior Nasal Swab  Result Value Ref Range   SARS Coronavirus 2 by RT PCR NEGATIVE NEGATIVE    Comment: (NOTE) SARS-CoV-2 target nucleic acids are NOT DETECTED.  The SARS-CoV-2 RNA is generally detectable in upper respiratory specimens during the acute phase of infection. The lowest concentration of SARS-CoV-2 viral copies this assay can detect is 138 copies/mL. A negative result does not preclude SARS-Cov-2 infection and should not be used as the sole basis for treatment or other patient management decisions. A negative result  may occur with  improper specimen collection/handling, submission of specimen other than nasopharyngeal swab, presence of viral mutation(s) within the areas targeted by this assay, and inadequate number of viral copies(<138 copies/mL). A negative result must be combined with clinical observations, patient history, and epidemiological information. The expected result is Negative.  Fact Sheet for Patients:  BloggerCourse.com  Fact Sheet for Healthcare Providers:  SeriousBroker.it  This test is no t yet approved or cleared by the [place] FDA and  has been authorized for detection and/or diagnosis of SARS-CoV-2 by FDA under an Emergency Use Authorization (EUA). This EUA will remain  in effect (meaning this test can be used) for the duration of the COVID-19 declaration under Section 564(b)(1) of the Act, 21 U.S.C.section 360bbb-3(b)(1), unless the authorization is terminated  or revoked sooner.       Influenza A by PCR NEGATIVE NEGATIVE   Influenza B by PCR NEGATIVE NEGATIVE    Comment: (NOTE) The Xpert Xpress SARS-CoV-2/FLU/RSV plus assay is intended as an aid in the diagnosis of influenza from Nasopharyngeal swab specimens and should not be used as a sole basis for treatment. Nasal washings and aspirates are unacceptable for Xpert Xpress  SARS-CoV-2/FLU/RSV testing.  Fact Sheet for Patients: BloggerCourse.com  Fact Sheet for Healthcare Providers: SeriousBroker.it  This test is not yet approved or cleared by the [place] FDA and has been authorized for detection and/or diagnosis of SARS-CoV-2 by FDA under an Emergency Use Authorization (EUA). This EUA will remain in effect (meaning this test can be used) for the duration of the COVID-19 declaration under Section 564(b)(1) of the Act, 21 U.S.C. section 360bbb-3(b)(1), unless the authorization is terminated or revoked.     Resp Syncytial Virus by PCR NEGATIVE NEGATIVE    Comment: (NOTE) Fact Sheet for Patients: BloggerCourse.com  Fact Sheet for Healthcare Providers: SeriousBroker.it  This test is not yet approved or cleared by the [place] FDA and has been authorized for detection and/or diagnosis of SARS-CoV-2 by FDA under an Emergency Use Authorization (EUA). This EUA will remain in effect (meaning this test can be used) for the duration of the COVID-19 declaration under Section 564(b)(1) of the Act, 21 U.S.C. section 360bbb-3(b)(1), unless the authorization is terminated or revoked.  Performed at [facility], [address] [place], [place] [address]   Urinalysis, w/ Reflex to Culture (Infection Suspected) -Urine, Clean Catch     Status: Abnormal   Collection Time: 04/03/23  3:18 PM  Result Value Ref Range   Specimen Source URINE, CLEAN CATCH    Color, Urine YELLOW (A) YELLOW   APPearance CLOUDY (A) CLEAR   Specific Gravity, Urine 1.005 1.005 - 1.030   pH 6.0 5.0 - 8.0   Glucose, UA >=500 (A) NEGATIVE mg/dL   Hgb urine dipstick SMALL (A) NEGATIVE   Bilirubin Urine NEGATIVE NEGATIVE   Ketones, ur NEGATIVE NEGATIVE mg/dL   Protein, ur 295 (A) NEGATIVE mg/dL   Nitrite NEGATIVE NEGATIVE   Leukocytes,Ua LARGE (A) NEGATIVE   RBC / HPF  0-5 0 - 5 RBC/hpf   WBC, UA >50 0 - 5 WBC/hpf    Comment:        Reflex urine culture not performed if WBC <=10, OR if Squamous epithelial cells >5. If Squamous epithelial cells >5 suggest recollection.    Bacteria, UA RARE (A) NONE SEEN   Squamous Epithelial / HPF 0-5 0 - 5 /HPF   WBC Clumps PRESENT    Mucus PRESENT     Comment: Performed  at [facility], [address], [place], [place] [address]  Urine Culture     Status: Abnormal   Collection Time: 04/03/23  3:18 PM   Specimen: Urine, Random  Result Value Ref Range   Specimen Description      URINE, RANDOM Performed at [facility], [address] [place], [place] [address]    Special Requests      NONE Reflexed from [id number] Performed at [facility], [address] [place], [place] [address]    Culture >=100,000 COLONIES/mL STAPHYLOCOCCUS EPIDERMIDIS (A)    Report Status 04/05/2023 FINAL    Organism ID, Bacteria STAPHYLOCOCCUS EPIDERMIDIS (A)       Susceptibility   Staphylococcus epidermidis - MIC*    CIPROFLOXACIN <=0.5 SENSITIVE Sensitive     GENTAMICIN <=0.5 SENSITIVE Sensitive     NITROFURANTOIN <=16 SENSITIVE Sensitive     OXACILLIN <=0.25 SENSITIVE Sensitive     TETRACYCLINE <=1 SENSITIVE Sensitive     VANCOMYCIN 1 SENSITIVE Sensitive     TRIMETH/SULFA <=10 SENSITIVE Sensitive     RIFAMPIN <=0.5 SENSITIVE Sensitive     Inducible Clindamycin NEGATIVE Sensitive     * >=100,000 COLONIES/mL STAPHYLOCOCCUS EPIDERMIDIS  Ethanol     Status: Abnormal   Collection Time: 04/03/23  3:30 PM  Result Value Ref Range   Alcohol, Ethyl (B) 87 (H) <10 mg/dL    Comment: (NOTE) Lowest detectable limit for serum alcohol is 10 mg/dL.  For medical purposes only. Performed at [facility], [address] [place], [place] [address]        Assessment & Plan:   Problem List Items Addressed This Visit       Endocrine   Type 2 diabetes mellitus with hyperlipidemia (HCC)   Checking  labs today.  Continue current therapy for lipid control. Will modify as needed based on labwork results.         Relevant Medications   empagliflozin (JARDIANCE) 25 MG TABS tablet   Other Relevant Orders   CBC with Diff   CMP14+EGFR   Lipid Profile   Type II diabetes mellitus with renal manifestations (HCC)   Checking labs today. Will call pt. With results  Continue current diabetes POC, as patient has been well controlled on current regimen.  Will adjust meds if needed based on labs.        Relevant Medications   empagliflozin (JARDIANCE) 25 MG TABS tablet   Other Relevant Orders   CBC with Diff   CMP14+EGFR   Type 2 diabetes mellitus with hyperglycemia, with long-term current use of insulin (HCC)   Checking labs today. Will call pt. With results  Continue current diabetes POC, as patient has been well controlled on current regimen.  Will adjust meds if needed based on labs.        Relevant Medications   empagliflozin (JARDIANCE) 25 MG TABS tablet   Other Relevant Orders   CBC with Diff   CMP14+EGFR   Hemoglobin A1c     Genitourinary   Chronic kidney disease, stage 3b (HCC)   Patient is seen by Nephrology, who manage this condition.  She is well controlled with current therapy.   Will defer to them for further changes to plan of care.       Relevant Orders   CBC with Diff   CMP14+EGFR   Other Visit Diagnoses       B12 deficiency due to diet    -  Primary   Checking labs today.  Will continue supplements as needed.   Relevant Orders   CBC with Diff   CMP14+EGFR   Vitamin B12     Vitamin D deficiency, unspecified       Checking labs today.  Will continue supplements as needed.   Relevant Orders   CBC with Diff   CMP14+EGFR   Vitamin D (25 hydroxy)     Other fatigue       Relevant Orders   CBC with Diff   CMP14+EGFR   TSH       Return in about 3 months (around 07/12/2023) for F/U.   Total time spent: 20 minutes  Miki Kins,  FNP  04/12/2023   This document may have been prepared by [facility] Voice Recognition software and as such may include unintentional dictation errors.

## 2023-04-12 NOTE — Assessment & Plan Note (Addendum)
 Checking labs today.  Continue current therapy for lipid control. Will modify as needed based on labwork results.

## 2023-04-12 NOTE — Assessment & Plan Note (Signed)
 Patient is seen by Nephrology, who manage this condition.  She is well controlled with current therapy.   Will defer to them for further changes to plan of care.

## 2023-04-14 ENCOUNTER — Other Ambulatory Visit: Payer: Self-pay

## 2023-04-14 ENCOUNTER — Emergency Department
Admission: EM | Admit: 2023-04-14 | Discharge: 2023-04-14 | Disposition: A | Discharge status: Home / Self Care | Attending: Emergency Medicine | Admitting: Emergency Medicine

## 2023-04-14 DIAGNOSIS — R339 Retention of urine, unspecified: Secondary | ICD-10-CM | POA: Insufficient documentation

## 2023-04-14 DIAGNOSIS — I10 Essential (primary) hypertension: Secondary | ICD-10-CM | POA: Diagnosis not present

## 2023-04-14 DIAGNOSIS — E119 Type 2 diabetes mellitus without complications: Secondary | ICD-10-CM | POA: Diagnosis not present

## 2023-04-14 LAB — CMP14+EGFR
ALT: 37 IU/L — ABNORMAL HIGH (ref 0–32)
AST: 44 IU/L — ABNORMAL HIGH (ref 0–40)
Albumin: 4.3 g/dL (ref 3.9–4.9)
Alkaline Phosphatase: 546 IU/L — ABNORMAL HIGH (ref 44–121)
BUN/Creatinine Ratio: 20 (ref 12–28)
BUN: 33 mg/dL — ABNORMAL HIGH (ref 8–27)
Bilirubin Total: 0.2 mg/dL (ref 0.0–1.2)
CO2: 16 mmol/L — ABNORMAL LOW (ref 20–29)
Calcium: 8.9 mg/dL (ref 8.7–10.3)
Chloride: 98 mmol/L (ref 96–106)
Creatinine, Ser: 1.65 mg/dL — ABNORMAL HIGH (ref 0.57–1.00)
Globulin, Total: 2.8 g/dL (ref 1.5–4.5)
Glucose: 361 mg/dL — ABNORMAL HIGH (ref 70–99)
Potassium: 3.9 mmol/L (ref 3.5–5.2)
Sodium: 136 mmol/L (ref 134–144)
Total Protein: 7.1 g/dL (ref 6.0–8.5)
eGFR: 34 mL/min/{1.73_m2} — ABNORMAL LOW (ref >59)

## 2023-04-14 LAB — CBC WITH DIFFERENTIAL/PLATELET
Abs Immature Granulocytes: 0.05 10*3/uL (ref 0.00–0.07)
Basophils Absolute: 0.1 10*3/uL (ref 0.0–0.2)
Basophils Absolute: 0.1 10*3/uL (ref 0.0–0.1)
Basophils Relative: 1 %
Basos: 1 %
EOS (ABSOLUTE): 0.1 10*3/uL (ref 0.0–0.4)
Eos: 2 %
Eosinophils Absolute: 0.1 10*3/uL (ref 0.0–0.5)
Eosinophils Relative: 2 %
HCT: 34.3 % — ABNORMAL LOW (ref 36.0–46.0)
Hematocrit: 36.9 % (ref 34.0–46.6)
Hemoglobin: 12.2 g/dL (ref 11.1–15.9)
Hemoglobin: 11.7 g/dL — ABNORMAL LOW (ref 12.0–15.0)
Immature Grans (Abs): 0.1 10*3/uL (ref 0.0–0.1)
Immature Granulocytes: 1 %
Immature Granulocytes: 1 %
Lymphocytes Absolute: 2.3 10*3/uL (ref 0.7–3.1)
Lymphocytes Relative: 34 %
Lymphs Abs: 2.2 10*3/uL (ref 0.7–4.0)
Lymphs: 29 %
MCH: 30.1 pg (ref 26.6–33.0)
MCH: 29.8 pg (ref 26.0–34.0)
MCHC: 33.1 g/dL (ref 31.5–35.7)
MCHC: 34.1 g/dL (ref 30.0–36.0)
MCV: 91 fL (ref 79–97)
MCV: 87.3 fL (ref 80.0–100.0)
Monocytes Absolute: 0.7 10*3/uL (ref 0.1–0.9)
Monocytes Absolute: 0.5 10*3/uL (ref 0.1–1.0)
Monocytes Relative: 8 %
Monocytes: 9 %
Neutro Abs: 3.4 10*3/uL (ref 1.7–7.7)
Neutrophils Absolute: 4.6 10*3/uL (ref 1.4–7.0)
Neutrophils Relative %: 54 %
Neutrophils: 58 %
Platelets: 251 10*3/uL (ref 150–450)
Platelets: 253 10*3/uL (ref 150–400)
RBC: 4.05 x10E6/uL (ref 3.77–5.28)
RBC: 3.93 MIL/uL (ref 3.87–5.11)
RDW: 12.2 % (ref 11.7–15.4)
RDW: 12 % (ref 11.5–15.5)
WBC: 7.8 10*3/uL (ref 3.4–10.8)
WBC: 6.3 10*3/uL (ref 4.0–10.5)
nRBC: 0 % (ref 0.0–0.2)

## 2023-04-14 LAB — COMPREHENSIVE METABOLIC PANEL WITH GFR
ALT: 35 U/L (ref 0–44)
AST: 54 U/L — ABNORMAL HIGH (ref 15–41)
Albumin: 3.6 g/dL (ref 3.5–5.0)
Alkaline Phosphatase: 385 U/L — ABNORMAL HIGH (ref 38–126)
Anion gap: 8 (ref 5–15)
BUN: 27 mg/dL — ABNORMAL HIGH (ref 8–23)
CO2: 24 mmol/L (ref 22–32)
Calcium: 8.9 mg/dL (ref 8.9–10.3)
Chloride: 104 mmol/L (ref 98–111)
Creatinine, Ser: 1.81 mg/dL — ABNORMAL HIGH (ref 0.44–1.00)
GFR, Estimated: 30 mL/min — ABNORMAL LOW (ref >60)
Glucose, Bld: 224 mg/dL — ABNORMAL HIGH (ref 70–99)
Potassium: 4 mmol/L (ref 3.5–5.1)
Sodium: 136 mmol/L (ref 135–145)
Total Bilirubin: 0.8 mg/dL (ref 0.0–1.2)
Total Protein: 7.3 g/dL (ref 6.5–8.1)

## 2023-04-14 LAB — LIPID PANEL
Chol/HDL Ratio: 2.6 ratio (ref 0.0–4.4)
Cholesterol, Total: 222 mg/dL — ABNORMAL HIGH (ref 100–199)
HDL: 85 mg/dL (ref >39)
LDL Chol Calc (NIH): 81 mg/dL (ref 0–99)
Triglycerides: 354 mg/dL — ABNORMAL HIGH (ref 0–149)
VLDL Cholesterol Cal: 56 mg/dL — ABNORMAL HIGH (ref 5–40)

## 2023-04-14 LAB — VITAMIN D 25 HYDROXY (VIT D DEFICIENCY, FRACTURES): Vit D, 25-Hydroxy: 27.2 ng/mL — ABNORMAL LOW (ref 30.0–100.0)

## 2023-04-14 LAB — TSH: TSH: 3.19 u[IU]/mL (ref 0.450–4.500)

## 2023-04-14 LAB — HEMOGLOBIN A1C
Est. average glucose Bld gHb Est-mCnc: 243 mg/dL
Hgb A1c MFr Bld: 10.1 % — ABNORMAL HIGH (ref 4.8–5.6)

## 2023-04-14 LAB — VITAMIN B12: Vitamin B-12: 719 pg/mL (ref 232–1245)

## 2023-04-14 NOTE — ED Triage Notes (Signed)
 Pt had urinary catheter removed yesterday at [facility]. Pt reports she is having difficulty urinating since. Pt reports small dribble this morning but otherwise has not emptied her bladder since having the catheter removed around lunch time yesterday. Pt c/o suprapubic pressure and urge to urinate. She says her left lower back is hurting also.

## 2023-04-14 NOTE — ED Provider Notes (Signed)
 [facility] Provider Note    Event Date/Time   First MD Initiated Contact with Patient 04/14/23 1000     (approximate)   History   Urinary Retention  Discussed with the patient's language barrier.  Patient reports that English is her primary and best language.  She did use to speak [place] but only till the age of 89.  HPI  Karen [name] is a 68 y.o. female history of transaminitis type 2 diabetes hypertension and a history of prior urinary retention, patiently treated for cystitis   Patient relates that she had a Foley catheter for about 2 weeks.  She went to [facility].  Yesterday they were telling her about possibly doing catheterizing 3 or 4 times a day, but she cannot due to her arthritis.  She wanted to consider other options, she talked with her daughter and decided to leave because she did not feel that they were listening to her concerns.  Since going home she has not urinated for about 24 hours.  She reports that she has a history of bladder retention has had to have a urine bag several times in the past.  She feels like she needs to have a urine bag placed back in.  She would like to follow-up with a urologist here in [place] instead of [facility]   Reviewed urology note from Dr. Pernell Dupre at [facility] yesterday.  In that note it is noted the patient is supposed to be performing clean intermittent catheterization 3-4 times daily and recording volumes with a 2-week follow-up.  Appears the patient did not pass a trial of voiding at the urology clinic.  Patient evidently left the clinic without notifying staff.  It appears that at that time they felt that the patient would need to have a Foley placed.  Physical Exam   Triage Vital Signs: ED Triage Vitals  Encounter Vitals Group     BP 04/14/23 0936 (!) 172/80     Systolic BP Percentile --      Diastolic BP Percentile --      Pulse Rate 04/14/23 0935 88     Resp 04/14/23 0935 19     Temp 04/14/23 0935 97.8 F (36.6  C)     Temp Source 04/14/23 0935 Oral     SpO2 04/14/23 0935 100 %     Weight 04/14/23 0934 102 lb 4.7 oz (46.4 kg)     Height 04/14/23 0934 5\' 1"  (1.549 m)     Head Circumference --      Peak Flow --      Pain Score 04/14/23 0934 7     Pain Loc --      Pain Education --      Exclude from Growth Chart --     Most recent vital signs: Vitals:   04/14/23 0935 04/14/23 0936  BP:  (!) 172/80  Pulse: 88   Resp: 19   Temp: 97.8 F (36.6 C)   SpO2: 100%      General: Awake, no distress.  Very pleasant CV:  Good peripheral perfusion.  Resp:  Normal effort.  Abd:  No distention.  Abdomen somewhat distended in the lower portion, bladder appears to be palpable.  She reports some moderate tenderness to palpation over the suprapubic region.  Upper abdomen soft.  No rigidity.  No nausea or vomiting Other:     ED Results / Procedures / Treatments   Labs (all labs ordered are listed, but only abnormal results are displayed)  Labs Reviewed  CBC WITH DIFFERENTIAL/PLATELET - Abnormal; Notable for the following components:      Result Value   Hemoglobin 11.7 (*)    HCT 34.3 (*)    All other components within normal limits  COMPREHENSIVE METABOLIC PANEL WITH GFR - Abnormal; Notable for the following components:   Glucose, Bld 224 (*)    BUN 27 (*)    Creatinine, Ser 1.81 (*)    AST 54 (*)    Alkaline Phosphatase 385 (*)    GFR, Estimated 30 (*)    All other components within normal limits  URINE CULTURE     EKG     RADIOLOGY  Patient has a known well-documented history of urinary retention, do not find symptomatology today that would be suggestive that repeat imaging would be of benefit.   PROCEDURES:  Critical Care performed: No  Procedures   MEDICATIONS ORDERED IN ED: Medications - No data to display   IMPRESSION / MDM / ASSESSMENT AND PLAN / ED COURSE  I reviewed the triage vital signs and the nursing notes.                              Differential  diagnosis includes, but is not limited to, acute or recurrent urinary retention, bladder outlet obstruction, less likely felt to be AKI she reports only symptom of difficulty with inability to pass urine, this has been a recurrent history for her.  She has no fever no signs or symptoms that would suggest a acute process outside of urinary retention, rather given the clinical history she does not have any obvious symptoms of ongoing infection.  Will insert Foley catheter and reevaluate symptomatology, sent for culture.  She had a recent culture that showed staph epidermidis, has since followed up with urology and primary care  Patient's presentation is most consistent with acute complicated illness / injury requiring diagnostic workup.   ----------------------------------------- 11:11 AM on 04/14/2023 ----------------------------------------- Patient reports all symptoms improved.  Foley catheter draining approximately 450 mL clear yellow urine.  Creatinine at baseline  Labs reassuring.  Patient comfortable with plan discharge.  She is cared for Foley catheter in the past.  Discussed careful return precautions and made clear and patient verbalized clear plan to follow-up with [facility] urologic  Return precautions and treatment recommendations and follow-up discussed with the patient who is agreeable with the plan.      FINAL CLINICAL IMPRESSION(S) / ED DIAGNOSES   Final diagnoses:  Acute on chronic urinary retention     Rx / DC Orders   ED Discharge Orders          Ordered    Ambulatory referral to Urology       Comments: Acute retention with foley placed in ER. See [facility] notes as well   04/14/23 1018             Note:  This document was prepared using Dragon voice recognition software and may include unintentional dictation errors.   Sharyn Creamer, MD 04/14/23 1112

## 2023-04-14 NOTE — Discharge Instructions (Signed)
 Please return to the emergency room right away if you are to develop a fever, feel your bladder is obstructed, severe nausea, your pain becomes severe or worsens, you are unable to keep food down, begin vomiting any dark or bloody fluid, you develop any dark or bloody stools, feel dehydrated, or other new concerns or symptoms arise.

## 2023-04-15 LAB — URINE CULTURE: Culture: NO GROWTH

## 2023-04-19 ENCOUNTER — Other Ambulatory Visit: Payer: Self-pay | Admitting: Family

## 2023-04-19 NOTE — Progress Notes (Signed)
 Patient notified

## 2023-04-23 ENCOUNTER — Other Ambulatory Visit: Payer: Self-pay | Admitting: Family

## 2023-04-28 ENCOUNTER — Emergency Department
Admission: EM | Admit: 2023-04-28 | Discharge: 2023-04-28 | Disposition: A | Discharge status: Home / Self Care | Attending: Emergency Medicine | Admitting: Emergency Medicine

## 2023-04-28 ENCOUNTER — Other Ambulatory Visit: Payer: Self-pay

## 2023-04-28 ENCOUNTER — Emergency Department

## 2023-04-28 DIAGNOSIS — R110 Nausea: Secondary | ICD-10-CM | POA: Insufficient documentation

## 2023-04-28 DIAGNOSIS — R1032 Left lower quadrant pain: Secondary | ICD-10-CM | POA: Insufficient documentation

## 2023-04-28 DIAGNOSIS — N3090 Cystitis, unspecified without hematuria: Secondary | ICD-10-CM

## 2023-04-28 LAB — COMPREHENSIVE METABOLIC PANEL WITH GFR
ALT: 37 U/L (ref 0–44)
AST: 51 U/L — ABNORMAL HIGH (ref 15–41)
Albumin: 3.7 g/dL (ref 3.5–5.0)
Alkaline Phosphatase: 329 U/L — ABNORMAL HIGH (ref 38–126)
Anion gap: 13 (ref 5–15)
BUN: 26 mg/dL — ABNORMAL HIGH (ref 8–23)
CO2: 21 mmol/L — ABNORMAL LOW (ref 22–32)
Calcium: 9.4 mg/dL (ref 8.9–10.3)
Chloride: 102 mmol/L (ref 98–111)
Creatinine, Ser: 1.8 mg/dL — ABNORMAL HIGH (ref 0.44–1.00)
GFR, Estimated: 31 mL/min — ABNORMAL LOW (ref >60)
Glucose, Bld: 183 mg/dL — ABNORMAL HIGH (ref 70–99)
Potassium: 4.3 mmol/L (ref 3.5–5.1)
Sodium: 136 mmol/L (ref 135–145)
Total Bilirubin: 0.5 mg/dL (ref 0.0–1.2)
Total Protein: 7.8 g/dL (ref 6.5–8.1)

## 2023-04-28 LAB — CBC
HCT: 37.3 % (ref 36.0–46.0)
Hemoglobin: 12.8 g/dL (ref 12.0–15.0)
MCH: 30.1 pg (ref 26.0–34.0)
MCHC: 34.3 g/dL (ref 30.0–36.0)
MCV: 87.8 fL (ref 80.0–100.0)
Platelets: 299 10*3/uL (ref 150–400)
RBC: 4.25 MIL/uL (ref 3.87–5.11)
RDW: 12.1 % (ref 11.5–15.5)
WBC: 9.8 10*3/uL (ref 4.0–10.5)
nRBC: 0 % (ref 0.0–0.2)

## 2023-04-28 LAB — URINALYSIS, ROUTINE W REFLEX MICROSCOPIC
Bilirubin Urine: NEGATIVE
Glucose, UA: >=500 mg/dL — AB
Ketones, ur: NEGATIVE mg/dL
Leukocytes,Ua: NEGATIVE
Nitrite: POSITIVE — AB
Protein, ur: >=300 mg/dL — AB
Specific Gravity, Urine: 1.01 (ref 1.005–1.030)
pH: 5 (ref 5.0–8.0)

## 2023-04-28 LAB — LIPASE, BLOOD: Lipase: 78 U/L — ABNORMAL HIGH (ref 11–51)

## 2023-04-28 MED ORDER — ONDANSETRON HCL 4 MG/2ML IJ SOLN
4.0000 mg | Freq: Once | INTRAMUSCULAR | Status: AC
Start: 2023-04-28 — End: 2023-04-28
  Administered 2023-04-28: 4 mg via INTRAVENOUS
  Filled 2023-04-28: qty 2

## 2023-04-28 MED ORDER — SODIUM CHLORIDE 0.9 % IV SOLN: Freq: Once | INTRAVENOUS | Status: AC

## 2023-04-28 MED ORDER — CEPHALEXIN 500 MG PO CAPS: 500.0000 mg | ORAL_CAPSULE | Freq: Two times a day (BID) | ORAL | 0 refills | Status: AC

## 2023-04-28 MED ORDER — IOHEXOL 300 MG/ML  SOLN
60.0000 mL | Freq: Once | INTRAMUSCULAR | Status: AC | PRN
  Administered 2023-04-28: 60 mL via INTRAVENOUS

## 2023-04-28 MED ORDER — MORPHINE SULFATE (PF) 4 MG/ML IV SOLN
4.0000 mg | Freq: Once | INTRAVENOUS | Status: AC
  Administered 2023-04-28: 4 mg via INTRAVENOUS
  Filled 2023-04-28: qty 1

## 2023-04-28 MED ORDER — CEPHALEXIN 500 MG PO CAPS
500.0000 mg | ORAL_CAPSULE | Freq: Once | ORAL | Status: AC
  Administered 2023-04-28: 500 mg via ORAL
  Filled 2023-04-28: qty 1

## 2023-04-28 NOTE — ED Provider Notes (Signed)
-----------------------------------------   4:51 PM on 04/28/2023 -----------------------------------------  CT shows bladder wall thickening which goes along with the patient's urinalysis, most consistent with cystitis.  Review of her most recent urine cultures shows that her cultures last month were negative, but in September of last year she had Staph epidermidis which was pansensitive.  On reassessment the patient states she is feeling much better and is eager to go home.  I counseled her on the results of the workup.  She is stable for discharge at this time.  I have given a dose of Keflex  here and prescribed the same for home.  I gave strict return precautions and she expressed understanding.  CT abdomen/pelvis:  IMPRESSION:  1. Diffuse bladder wall thickening.  2. Ascending colonic diverticulosis.  3.  Aortic Atherosclerosis (ICD10-I70.0).     Lind Repine, MD 04/28/23 [phone]

## 2023-04-28 NOTE — ED Triage Notes (Signed)
 Pt has a history of pancreatititis. Pt states that she usually has pain in her belly, but today it is worse. Pt feels pressure in her lower abdominal, accompanied by a burning sensation. Pt rates pain 9/10. Pt states that she has no appetite, and hasn't eaten in 4-5 days. Pt states that the times that she does try to eat or drink anything, it makes her stomach hurt even more. Pt is alert and oriented x4 with no signs of acute distress at this time.

## 2023-04-28 NOTE — ED Provider Notes (Signed)
 [facility] Provider Note    Event Date/Time   First MD Initiated Contact with Patient 04/28/23 1440     (approximate)   History   Abdominal Pain   HPI  Karen [name] is a 68 y.o. 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  She reports she has not had pain like this before.  She notes that it radiates around her left flank.  No fevers or chills.  Positive nausea.  Normal stools.  History of appendectomy, hysterectomy     Physical Exam   Triage Vital Signs: ED Triage Vitals  Encounter Vitals Group     BP 04/28/23 1355 129/77     Systolic BP Percentile --      Diastolic BP Percentile --      Pulse Rate 04/28/23 1355 96     Resp 04/28/23 1355 20     Temp 04/28/23 1355 97.8 F (36.6 C)     Temp Source 04/28/23 1355 Oral     SpO2 04/28/23 1355 100 %     Weight 04/28/23 1355 44.9 kg (99 lb)     Height 04/28/23 1355 1.549 m (5\' 1" )     Head Circumference --      Peak Flow --      Pain Score 04/28/23 1405 9     Pain Loc --      Pain Education --      Exclude from Growth Chart --     Most recent vital signs: Vitals:   04/28/23 1355  BP: 129/77  Pulse: 96  Resp: 20  Temp: 97.8 F (36.6 C)  SpO2: 100%     General: Awake, no distress.  CV:  Good peripheral perfusion.  Resp:  Normal effort.  Abd:  No distention.  Tenderness in the left lower quadrant, no CVA tenderness Other:     ED Results / Procedures / Treatments   Labs (all labs ordered are listed, but only abnormal results are displayed) Labs Reviewed  LIPASE, BLOOD - Abnormal; Notable for the following components:      Result Value   Lipase 78 (*)    All other components within normal limits  COMPREHENSIVE METABOLIC PANEL WITH GFR - Abnormal; Notable for the following components:   CO2 21 (*)    Glucose, Bld 183 (*)    BUN 26 (*)    Creatinine, Ser 1.80 (*)    AST  51 (*)    Alkaline Phosphatase 329 (*)    GFR, Estimated 31 (*)    All other components within normal limits  CBC  URINALYSIS, ROUTINE W REFLEX MICROSCOPIC     EKG     RADIOLOGY CT abdomen pelvis pending    PROCEDURES:  Critical Care performed:   Procedures   MEDICATIONS ORDERED IN ED: Medications  morphine  (PF) 4 MG/ML injection 4 mg (has no administration in time range)  ondansetron  (ZOFRAN ) injection 4 mg (has no administration in time range)  0.9 %  sodium chloride  infusion (has no administration in time range)     IMPRESSION / MDM / ASSESSMENT AND PLAN / ED COURSE  I reviewed the triage vital signs and the nursing notes. Patient's presentation is most consistent with acute presentation with potential threat to life or bodily function.  Patient presents with left lower quadrant abdominal pain as detailed above, differential includes diverticulitis, diverticular abscess, ureterolithiasis, UTI, pancreatitis  Will treat  with IV morphine , IV Zofran , IV fluids, obtain CT abdomen pelvis  Lab work reviewed, GFR is above the cutoff for CT scan with IV dye  I have asked my colleague to follow-up on CT results, urinalysis and reevaluate.        FINAL CLINICAL IMPRESSION(S) / ED DIAGNOSES   Final diagnoses:  Left lower quadrant abdominal pain     Rx / DC Orders   ED Discharge Orders     None        Note:  This document was prepared using Dragon voice recognition software and may include unintentional dictation errors.   Bryson Carbine, MD 04/28/23 [phone]

## 2023-04-28 NOTE — Discharge Instructions (Signed)
 Take the antibiotic as prescribed and finish the full course.  Return to the ER for new, worsening, or persistent severe abdominal pain, difficulty urinating, fever, vomiting, weakness, or any other new or worsening symptoms that concern you.

## 2023-05-03 ENCOUNTER — Ambulatory Visit: Payer: Self-pay

## 2023-05-03 NOTE — Telephone Encounter (Signed)
  Chief Complaint: urinary catheter bag leakage Symptoms: intermittent abd pain Frequency: this AM Pertinent Negatives: Patient denies fever, dislodgement, visual holes Disposition: [] ED /[x] Urgent Care (no appt availability in office) / [] Appointment(In office/virtual)/ []  [facility]/ [] Home Care/ [] Refused Recommended Disposition /[] [facility]/ [x]  Follow-up with PCP Additional Notes: Pt c/o urinary catheter bag leakage. Pt reports waking up this AM with bed wet from catheter. Pt cannot see any visible holes and does not endorse dislodgement. Pt denies visible blood. Pt does endorse clear yellow urine in bag and some intermittent abd pain d/t pancreatitis (and is on pain medication that provides some relief). Triager attempted to schedule, but pt already has established PCP and is not looking to TOC. Triager instructed pt to call PCP for same-day appt to address equipment malfunction or to go to nearest UC for evaluation. Patient verbalized understanding and to call back with additional questions/worsening symptoms.    Copied from CRM [id number]. Topic: Clinical - Medical Advice >> May 03, 2023  8:22 AM Leory Rands wrote: Reason for CRM: Patient is caling to report that on 04/14/23 she had catheterization 3-4 times . Reporting that the bag is leaking what should she do? >> May 03, 2023 11:10 AM Tiffany B wrote: Patient checking on the status of her previous message she is on her way to the ED because her urine catheter bag keeps leaking even when its not full.  Reason for Disposition  [1] Catheter is broken AND [2] is not working (does not function normally)  Answer Assessment - Initial Assessment Questions 1. SYMPTOMS: "What symptoms are you concerned about?"     Catheter leaking 2. ONSET:  "When did the symptoms start?"     This AM around 0200 - woke up with whole bed wet 3. FEVER: "Is there a fever?" If Yes, ask: "What is the temperature, how was it measured, and when  did it start?"     Denies, but has cold chills 4. ABDOMEN PAIN: "Is there any abdomen pain?" (e.g., Scale 1-10; or mild, moderate, severe)     denies 5. URINE COLOR: "What color is the urine?"  "Is there blood present in the urine?" (e.g., clear, yellow, cloudy, tea-colored, blood streaks, bright red)     Clear yellow 6. URINE AMOUNT: "When did you last empty the urine from the collection bag?" "How much urine was in the bag at that time?" How much urine is in the collection bag now?"     This AM 7. INSERTION: "How long have you (they) had the catheter?"     4/9 8. OTHER SYMPTOMS: "Are there any other symptoms?" (e.g., abdomen swelling, back pain, bladder spasms, constipation, foul smelling urine, leaking of urine)      Pt reports pancreatis - intermittent abd pain 9. MEDICINES: "Are you taking any medicines to treat urinary problems?" (e.g., antibiotics for a urinary tract infection, medicines to treat bladder spasms)      Currently taking pain medication, abx  Protocols used: Urinary Catheter (e.g., Foley) Symptoms and Questions-A-AH

## 2023-05-04 ENCOUNTER — Other Ambulatory Visit: Payer: Self-pay | Admitting: Family

## 2023-05-05 ENCOUNTER — Other Ambulatory Visit: Payer: Self-pay

## 2023-05-06 ENCOUNTER — Ambulatory Visit: Admitting: Physician Assistant

## 2023-05-06 ENCOUNTER — Encounter: Payer: Self-pay | Admitting: Urology

## 2023-05-06 ENCOUNTER — Ambulatory Visit (INDEPENDENT_AMBULATORY_CARE_PROVIDER_SITE_OTHER): Admitting: Urology

## 2023-05-06 ENCOUNTER — Other Ambulatory Visit: Payer: Self-pay

## 2023-05-06 VITALS — BP 168/88 | HR 92 | Ht 61.0 in | Wt 99.0 lb

## 2023-05-06 DIAGNOSIS — N319 Neuromuscular dysfunction of bladder, unspecified: Secondary | ICD-10-CM | POA: Diagnosis not present

## 2023-05-06 DIAGNOSIS — N390 Urinary tract infection, site not specified: Secondary | ICD-10-CM

## 2023-05-06 MED ORDER — HYDROCODONE-ACETAMINOPHEN 5-325 MG PO TABS: ORAL_TABLET | ORAL | 0 refills | Status: DC

## 2023-05-06 NOTE — Progress Notes (Signed)
 Adding order for SPT Placement in IR.

## 2023-05-06 NOTE — Patient Instructions (Addendum)
 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  There are no medicines or surgeries to fix the bladder.  Since you are unable to perform intermittent catheterization of your bladder, I would recommend a suprapubic tube(SP tube) that gets changed every month and keeps your bladder drained.  This will prevent recurrent infections and protect your kidney function.  Once the suprapubic tube is placed, we can remove the Foley catheter from below in your bladder.    Suprapubic Catheter Home Guide A suprapubic catheter is a soft tube that is used to drain pee (urine) from the bladder into a collection bag outside the body. The catheter is inserted into the bladder through a small opening in the lower abdomen, above the pubic bone (suprapubic area) and a few inches below your belly button. A tiny balloon filled with germ-free (sterile) water helps to keep the catheter in place. The collection bag must be emptied at least once a day and cleaned at least every other day. The collection bag can be put beside your bed at night and attached to your leg during the day. You may have a large collection bag to use at night and a smaller one to use during the day. Your suprapubic catheter may need to be changed every 4-6 weeks, or as told by your health care provider. Healing of the tract where the catheter is placed can take 6 weeks to 6 months. During that time, your provider may change your catheter. Once the tract is well healed, you or a caregiver will change your suprapubic catheter at home. What are the risks? Your provider will talk with you about risks. These may include: Blocked pee flow. This can occur if the catheter stops working, or if you have a blood clot in your bladder or in the catheter. Irritation of the skin around the catheter. Infection. Supplies needed: Two pairs of sterile gloves. Paper towels. Catheter. Two syringes. Sterile  water. Sterile cleaning solution. Lubricant. Collection bags. How to change the catheter  Drink plenty of fluids during the hours before you change the catheter. Wash your hands with soap and water for at least 20 seconds. If soap and water are not available, use hand sanitizer. Draw up sterile water into a syringe to have ready to fill the new catheter balloon. The amount will depend on the size of the balloon. Have all of your supplies ready and close to you on a paper towel. Lie on your back, sitting slightly upright so that you can see the catheter and opening. Put on sterile gloves. Clean the skin around the catheter opening using the sterile cleaning solution. Remove the water from the balloon in the catheter using a syringe. Slowly remove the catheter. If the catheter seems stuck, or if you have difficulty removing it: Do not pull on it. Call your provider right away. Place the old catheter on a paper towel to discard later. Take off the used gloves, and put on a new pair. Put lubricant on the end of the new catheter that will go into your bladder. Clean the skin around the catheter opening using the sterile cleaning solution. Gently slide the catheter through the opening in your abdomen and into the tract that leads to your bladder. Wait for some pee to start flowing through the catheter. Use a syringe to fill the catheter balloon with sterile water. Fill to the amount directed by your provider. Attach the collection bag to the end of the catheter. Make  sure the connection is tight. Remove the gloves and wash your hands with soap and water. How to care for the skin around the catheter Follow your provider's instructions on how to care for your skin. Use a clean washcloth and soapy water to clean the skin around your catheter every day. Pat the area dry with a clean paper towel. Do not pull on the catheter. Do not use ointment or lotion on this area, unless told by your  provider. Check the skin around the catheter every day for signs of infection. Check for: Redness, swelling, or pain. Fluid or blood. Warmth. Pus or a bad smell. How to empty and clean the collection bag Empty the large collection bag every 8 hours. Empty the small collection bag when it is about ? full. Clean the collection bag every 2-3 days, or as often as told by your provider. To do this: Wash your hands with soap and water for at least 20 seconds. If soap and water are not available, use hand sanitizer. Disconnect the bag from the catheter and immediately attach a new bag to the catheter. Hold the used bag over the toilet or another container. Turn the valve (spigot) at the bottom of the bag to empty the pee. Empty the used bag completely. Do not touch the opening of the spigot. Do not let the opening touch the toilet or container. Close the spigot tightly when the bag is empty. Clean the used bag in one of the following guides: According to the manufacturer's instructions. As told by your provider. Let the bag dry completely. Put it in a clean plastic bag before storing it. General tips Always wash your hands for at least 20 seconds before and after caring for your catheter and collection bag. Use a mild, fragrance-free soap. If soap and water are not available, use hand sanitizer. Clean the outside of the catheter with soap and water as often as told by your provider. Always make sure there are no twists or kinks in the catheter tube. Always make sure there are no leaks in the catheter or collection bag. Always wear the leg bag below your knee. Make sure the overnight drainage bag is always lower than the level of your bladder. Do not let it touch the floor. Before you go to sleep, hang the bag inside a wastebasket that is covered by a clean plastic bag. Drink enough fluid to keep your pee pale yellow. Do not take baths, swim, or use a hot tub until your provider approves. Ask your  provider if you may take showers. Contact a heath care provider if: You have any signs of infection around your catheter. You have a fever or chills. There is a change in the color or smell of your pee. You have vomiting that does not stop. You have back pain. You have blood in your pee. Also, seek care if: You have a hard time changing your catheter. You leak pee around your catheter. Your pee flow slows down. Get help right away if: Your catheter comes out and you are unable to insert a new one. You have no pee flow for 1 hour. This information is not intended to replace advice given to you by your health care provider. Make sure you discuss any questions you have with your health care provider. Document Revised: 08/18/2021 Document Reviewed: 08/18/2021 Elsevier Patient Education  2024 [employer].

## 2023-05-06 NOTE — Progress Notes (Signed)
 05/06/23 8:19 AM   Karen [name] 11/04/1955 [id number]  CC: Urinary retention, diabetic cystopathy, CKD, recurrent UTI  HPI: 68 year old female with a number of medical issues including CKD(creatinine 1.8, EGFR 31), poorly controlled diabetes(HgA1c >10), diabetic cystopathy and recurrent UTIs.  She is a very challenging historian.  Extensive chart review was performed of [facility] urology notes, as well as [facility]rology notes back to 2017 when incomplete emptying secondary to diabetic cystopathy was present at that time and CIC was recommended.  She currently has a Foley catheter in place.  She was seen by [facility] urology recently on 04/13/2023 and failed a voiding trial at that time and apparently they started to teach her CIC but she left clinic.  She returned to [facility] ER the next day with retention and a Foley catheter was placed.  She reportedly has failed at least 5 voiding trials in the last 1 to 2 years per the [facility] notes.  CT scans without catheter have shown hydronephrosis and worsening renal function on lab work.  She also has had recurrent urinary infections.  She does not think she is able to perform intermittent catheterization secondary to arthritis.   PMH: Past Medical History:  Diagnosis Date   Abdominal pain    Acute cystitis 05/18/2020   Acute kidney injury superimposed on CKD (HCC)    Acute on chronic pancreatitis (HCC) 01/03/2017   Acute renal insufficiency 10/29/2015   Adhesive capsulitis of left shoulder    Alcoholic pancreatitis    Asthma    Depression    Diabetes mellitus without complication (HCC)    Diabetic cataract of right eye (HCC) 08/31/2019   Formatting of this note might be different from the original. Added automatically from request for surgery [id number]   DKA (diabetic ketoacidoses) 02/17/2015   DKA (diabetic ketoacidosis) (HCC) 02/17/2015   IMO SNOMED Dx Update Oct 2024     Elevated alkaline phosphatase level 10/30/2021   Emphysematous cystitis     Esophageal candidiasis (HCC)    ETOH abuse 01/03/2017   Headache 02/26/2019   Hypercholesteremia    Hypertension    Hypertensive urgency 04/21/2016   Hypokalemia    Left flank pain 10/29/2015   Left upper quadrant abdominal pain 05/18/2020   Leukocytosis 06/13/2019   Malignant essential hypertension 10/29/2015   Upper GI bleed 11/11/2017   UTI (urinary tract infection) 01/24/2018    Surgical History: Past Surgical History:  Procedure Laterality Date   ABDOMINAL HYSTERECTOMY  1996   APPENDECTOMY  1997   ESOPHAGOGASTRODUODENOSCOPY N/A 11/12/2017   Procedure: ESOPHAGOGASTRODUODENOSCOPY (EGD);  Surgeon: Selena Daily, MD;  Location: [facility] ENDOSCOPY;  Service: Gastroenterology;  Laterality: N/A;   ESOPHAGOGASTRODUODENOSCOPY N/A 09/29/2018   Procedure: ESOPHAGOGASTRODUODENOSCOPY (EGD);  Surgeon: Toledo, Alphonsus Jeans, MD;  Location: [facility] ENDOSCOPY;  Service: Gastroenterology;  Laterality: N/A;   ESOPHAGOGASTRODUODENOSCOPY (EGD) WITH PROPOFOL  N/A 04/24/2016   Procedure: ESOPHAGOGASTRODUODENOSCOPY (EGD) WITH PROPOFOL ;  Surgeon: Marnee Sink, MD;  Location: [facility] ENDOSCOPY;  Service: Endoscopy;  Laterality: N/A;   ESOPHAGOGASTRODUODENOSCOPY (EGD) WITH PROPOFOL  N/A 01/28/2019   Procedure: ESOPHAGOGASTRODUODENOSCOPY (EGD) WITH PROPOFOL ;  Surgeon: Toledo, Alphonsus Jeans, MD;  Location: [facility] ENDOSCOPY;  Service: Gastroenterology;  Laterality: N/A;   EYE SURGERY     HAND SURGERY  1988   THYROID  SURGERY  2013     Family History: Family History  Problem Relation Age of Onset   Diabetes Mother     Social History:  reports that she has quit smoking. Her smoking use included cigarettes. She has never used  smokeless tobacco. She reports current alcohol use. She reports that she does not use drugs.  Physical Exam: BP (!) 168/88   Pulse 92   Ht 5\' 1"  (1.549 m)   Wt 99 lb (44.9 kg)   BMI 18.71 kg/m    Constitutional:  Alert and oriented, No acute distress. Cardiovascular: No clubbing, cyanosis, or  edema. Respiratory: Normal respiratory effort, no increased work of breathing. GI: Abdomen is soft, nontender, nondistended, no abdominal masses GU: Foley with yellow urine  Laboratory Data: Reviewed, see HPI  Pertinent Imaging: I have personally viewed and interpreted the multiple prior CT scans-when catheter not present significant bladder distention with bilateral hydronephrosis.  Assessment & Plan:   68 year old female with a number of comorbidities including CKD and long-term poorly controlled diabetes with resulting diabetic cystopathy, based on chart review this has been present since at least 2017 with incomplete bladder emptying.  She has required multiple Foley catheters over the last few years, had recurrent UTIs, and most recently with new hydronephrosis and worsening renal function when catheter is not in place.  She has failed at least 5 voiding trials over the last 1 to 2 years per the [facility] urology notes.  I had a very frank conversation with the patient about her uncontrolled diabetes and likely diabetic cystopathy with chronic incomplete bladder emptying, new hydronephrosis, and recurrent infections.  We reviewed that unfortunately there are no medicines or surgery to improve bladder function, and bladder management options would include intermittent catheterization 3-4 times daily, chronic Foley catheter, or chronic suprapubic tube.  She does not think she can perform CIC, and is interested in a suprapubic tube.  Theoretically if she is doing well could cap this during the day and open to drainage 3-4 times during the day, and connect to drainage overnight.  Referral placed to interventional radiology for suprapubic tube, urethral Foley can be removed after suprapubic tube placed  I spent 65 total minutes on the day of the encounter including pre-visit review of the medical record, face-to-face time with the patient, and post visit ordering of labs/imaging/tests.  Extensive review  of outside [facility] urology records and [facility] urology records dating back to 2017, as well as multiple imaging tests, nephrology notes, and multiple ER notes.  Jay Meth, MD 05/06/2023  [facility] [address], [address] [place], [place] [address] [phone]

## 2023-05-07 ENCOUNTER — Other Ambulatory Visit: Payer: Self-pay | Admitting: Family

## 2023-05-19 NOTE — Progress Notes (Signed)
 Patient for IR SPT Placement on Thurs 05/20/23, I called and spoke with the patient on the phone and gave pre-procedure instructions. Pt was made aware to be here at 10a, NPO after MN prior to procedure as well as driver post procedure/recovery/discharge. Pt stated understanding.  Called 05/19/23

## 2023-05-19 NOTE — H&P (Signed)
 Chief Complaint: Neurogenic bladder - IR consulted for suprapubic catheter placement  Referring Provider(s): Lawerence Pressman, MD   Supervising Physician: Myrlene Asper  Patient Status: [facility] - Out-pt  History of Present Illness: Karen [name] is a 68 y.o. 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  Has documented incomplete emptying back to 2017 when pt was seen with [facility] and [facility] urology. Recently saw Dr. Estanislao Heimlich with local urology group and discussed with patient options going forward. Currently she is requiring chronic foley catheter. Has failed at least 5 voiding trials in the last 2 years with hydronephrosis demonstrated when catheter was removed. Option was given to patient for retaining chronic foley, intermittent cath multiple times per day, or suprapubic catheter. Pt did not want to keep the chronic foley and felt she could not intermittently cath herself, so she opted to proceed with suprapubic catheter placement, which is reason for patient visit to IR today.   *** Patient is Full Code  Past Medical History:  Diagnosis Date   Abdominal pain    Acute cystitis 05/18/2020   Acute kidney injury superimposed on CKD (HCC)    Acute on chronic pancreatitis (HCC) 01/03/2017   Acute renal insufficiency 10/29/2015   Adhesive capsulitis of left shoulder    Alcoholic pancreatitis    Asthma    Depression    Diabetes mellitus without complication (HCC)    Diabetic cataract of right eye (HCC) 08/31/2019   Formatting of this note might be different from the original. Added automatically from request for surgery [id number]   DKA (diabetic ketoacidoses) 02/17/2015   DKA (diabetic ketoacidosis) (HCC) 02/17/2015   IMO SNOMED Dx Update Oct 2024     Elevated alkaline phosphatase level 10/30/2021   Emphysematous cystitis    Esophageal candidiasis (HCC)    ETOH abuse 01/03/2017   Headache 02/26/2019   Hypercholesteremia     Hypertension    Hypertensive urgency 04/21/2016   Hypokalemia    Left flank pain 10/29/2015   Left upper quadrant abdominal pain 05/18/2020   Leukocytosis 06/13/2019   Malignant essential hypertension 10/29/2015   Upper GI bleed 11/11/2017   UTI (urinary tract infection) 01/24/2018    Past Surgical History:  Procedure Laterality Date   ABDOMINAL HYSTERECTOMY  1996   APPENDECTOMY  1997   ESOPHAGOGASTRODUODENOSCOPY N/A 11/12/2017   Procedure: ESOPHAGOGASTRODUODENOSCOPY (EGD);  Surgeon: Selena Daily, MD;  Location: [facility] ENDOSCOPY;  Service: Gastroenterology;  Laterality: N/A;   ESOPHAGOGASTRODUODENOSCOPY N/A 09/29/2018   Procedure: ESOPHAGOGASTRODUODENOSCOPY (EGD);  Surgeon: Toledo, Alphonsus Jeans, MD;  Location: [facility] ENDOSCOPY;  Service: Gastroenterology;  Laterality: N/A;   ESOPHAGOGASTRODUODENOSCOPY (EGD) WITH PROPOFOL  N/A 04/24/2016   Procedure: ESOPHAGOGASTRODUODENOSCOPY (EGD) WITH PROPOFOL ;  Surgeon: Marnee Sink, MD;  Location: [facility] ENDOSCOPY;  Service: Endoscopy;  Laterality: N/A;   ESOPHAGOGASTRODUODENOSCOPY (EGD) WITH PROPOFOL  N/A 01/28/2019   Procedure: ESOPHAGOGASTRODUODENOSCOPY (EGD) WITH PROPOFOL ;  Surgeon: Toledo, Alphonsus Jeans, MD;  Location: [facility] ENDOSCOPY;  Service: Gastroenterology;  Laterality: N/A;   EYE SURGERY     HAND SURGERY  1988   THYROID  SURGERY  2013    Allergies: Metoclopramide , Penicillins, Fentanyl , Other, Oxycodone -acetaminophen , and Percocet [oxycodone -acetaminophen ]  Medications: Prior to Admission medications   Medication Sig Start Date End Date Taking? Authorizing Provider  amLODipine  (NORVASC ) 10 MG tablet Take 1 tablet (10 mg total) by mouth daily. for blood pressure 01/12/23   Trenda Frisk, FNP  ARIPiprazole  (ABILIFY ) 5 MG tablet Take 1 tablet (5 mg total) by mouth  daily. 11/17/19   Pahwani, Regino Caprio, MD  Bempedoic Acid-Ezetimibe (NEXLIZET) 180-10 MG TABS TAKE 1 TABLET BY MOUTH AT BEDTIME FOR CHOLESTEROL 06/26/22   Trenda Frisk, FNP  cetirizine   (ZYRTEC ) 5 MG tablet Take 1 tablet (5 mg total) by mouth daily. 11/17/19 04/12/23  Pahwani, Regino Caprio, MD  cyclobenzaprine  (FLEXERIL ) 5 MG tablet TAKE 1 TABLET BY MOUTH UP TO TWICE A DAYAS NEEDED FOR NECK AND BACK PAIN 12/02/22   Trenda Frisk, FNP  diazepam  (VALIUM ) 2 MG tablet TAKE 1 TABLET BY MOUTH EVERY 12 HOURS ASNEEDED FOR MUSCLE SPASMS 05/04/23   Trenda Frisk, FNP  docusate sodium  (COLACE) 100 MG capsule TAKE 1 CAPSULE BY MOUTH TWICE DAILY 10/06/22   Trenda Frisk, FNP  empagliflozin (JARDIANCE) 25 MG TABS tablet Take 25 mg by mouth daily. 03/31/23 03/30/24  [provider]  famotidine  (PEPCID ) 20 MG tablet TAKE 1 TABLET BY MOUTH TWICE DAILY 04/19/23   Trenda Frisk, FNP  hydrochlorothiazide  (HYDRODIURIL ) 12.5 MG tablet TAKE 1 TABLET BY MOUTH ONCE DAILY FOR SWELLING IN LEGS 02/16/22   Trenda Frisk, FNP  HYDROcodone -acetaminophen  (NORCO/VICODIN) 5-325 MG tablet TAKE 1 TABLET BY MOUTH 4 TIMES DAILY AS NEEDED FOR PAIN 05/06/23   Trenda Frisk, FNP  hydrOXYzine (ATARAX) 25 MG tablet TAKE 1 TABLET BY MOUTH TWICE DAILY AS NEEDED UNTIL RASH GOES AWAY 02/13/23   Trenda Frisk, FNP  insulin  aspart (NOVOLOG ) 100 UNIT/ML FlexPen Inject 2 Units into the skin 3 (three) times daily with meals. 11/17/19   Pahwani, Rinka R, MD  insulin  aspart protamine - aspart (NOVOLOG  MIX 70/30 FLEXPEN) (70-30) 100 UNIT/ML FlexPen INJECT 15 UNITS SUBCUTANEOUSLY ONCE EVERY MORNING AS DIRECTED *STOP OTHER INSULINS 03/26/23   Trenda Frisk, FNP  insulin  glargine (LANTUS  SOLOSTAR) 100 UNIT/ML Solostar Pen INJECT 20 UNITS SUBCUTANEOUSLY AT BEDTIME 07/01/22   Trenda Frisk, FNP  insulin  glargine (LANTUS ) 100 UNIT/ML injection Inject 0.15 mLs (15 Units total) into the skin at bedtime. 05/24/20   Alphonsus Jeans, MD  Insulin  Pen Needle 29G X MISC 1 Dose by Does not apply route 3 (three) times daily before meals. 06/18/19   Verla Glaze, MD  nortriptyline (PAMELOR) 25 MG capsule TAKE 1 CAPSULE  BY MOUTH AT BEDTIME 04/19/23   Trenda Frisk, FNP  omeprazole  (PRILOSEC) 40 MG capsule TAKE 1 CAPSULE BY MOUTH TWICE DAILY FOR REFULX 05/30/22   Trenda Frisk, FNP  ondansetron  (ZOFRAN  ODT) 4 MG disintegrating tablet Take 1 tablet (4 mg total) by mouth every 8 (eight) hours as needed for nausea or vomiting. 08/25/22   Trenda Frisk, FNP  Pancrelipase , Lip-Prot-Amyl, (ZENPEP ) 40000-126000 units CPEP TAKE 2 CAPSULES BY MOUTH WITH MEALS AND 1 WITH SNACKS 12/01/22   Trenda Frisk, FNP  Polyethylene Glycol 3350  POWD Take 17 g by mouth at bedtime. Mix 17 grams in 4-8 oz of liquid and drink     [provider]  polyethylene glycol powder (GLYCOLAX /MIRALAX ) 17 GM/SCOOP powder MIX 17 gms (1 CAPFUL) IN 8oz OF WATER OR JUICE AND DRINK ONCE DAILY. 04/19/23   Trenda Frisk, FNP  pregabalin  (LYRICA ) 200 MG capsule TAKE 1 CAPSULE BY MOUTH TWICE DAILY 07/08/22   Trenda Frisk, FNP  rosuvastatin  (CRESTOR ) 20 MG tablet TAKE 1 TABLET BY MOUTH ONCE EVERY EVENING 03/26/23   Trenda Frisk, FNP  tirzepatide  (MOUNJARO ) 5 MG/0.5ML Pen Inject 5 mg into the skin once a week. 01/12/23   Trenda Frisk, FNP  triamcinolone  cream (KENALOG ) 0.5 % Apply twice a day to back of thighs 06/18/19   Verla Glaze, MD  VASCEPA  0.5 g CAPS Take 4 capsules by mouth in the morning and at bedtime. 11/17/19   Pahwani, Regino Caprio, MD  zolpidem  (AMBIEN ) 10 MG tablet TAKE 1 TABLET BY MOUTH AT BEDTIME AS NEEDED FOR INSOMNIA 04/26/23   Trenda Frisk, FNP  GLUCERNA (GLUCERNA) LIQD Take 237 mLs by mouth 3 (three) times daily between meals. 02/19/15 02/27/15  Altha Athens, MD     Family History  Problem Relation Age of Onset   Diabetes Mother     Social History   Socioeconomic History   Marital status: Single    Spouse name: Not on file   Number of children: Not on file   Years of education: Not on file   Highest education level: Not on file  Occupational History   Occupation: disabled  Tobacco Use   Smoking  status: Former    Types: Cigarettes   Smokeless tobacco: Never   Tobacco comments:    quit 7 years ago   Vaping Use   Vaping status: Never Used  Substance and Sexual Activity   Alcohol use: Yes    Comment: rare   Drug use: No   Sexual activity: Not on file  Other Topics Concern   Not on file  Social History Narrative   Not on file   Social Drivers of Health   Financial Resource Strain: Low Risk  (10/31/2021)   Received from [facility], [facility]   Overall Financial Resource Strain (CARDIA)    Difficulty of Paying Living Expenses: Not hard at all  Food Insecurity: No Food Insecurity (10/31/2021)   Received from [facility], [facility]   Hunger Vital Sign    Worried About Running Out of Food in the Last Year: Never true    Ran Out of Food in the Last Year: Never true  Transportation Needs: Unmet Transportation Needs (10/31/2021)   Received from [facility], [facility]   [facility] - Transportation    Lack of Transportation (Medical): Yes    Lack of Transportation (Non-Medical): Yes  Physical Activity: Not on file  Stress: Not on file  Social Connections: Not on file     Review of Systems: A 12 point ROS discussed and pertinent positives are indicated in the HPI above.  All other systems are negative.  Review of Systems  Vital Signs: There were no vitals taken for this visit.  Advance Care Plan: The advanced care place/surrogate decision maker was discussed at the time of visit and the patient did not wish to discuss or was not able to name a surrogate decision maker or provide an advance care plan.  Physical Exam  Imaging: CT ABDOMEN PELVIS W CONTRAST Result Date: 04/28/2023 CLINICAL DATA:  Left lower quadrant pain EXAM: CT ABDOMEN AND PELVIS WITH CONTRAST TECHNIQUE: Multidetector CT imaging of the abdomen and pelvis was performed using the standard protocol following bolus administration of intravenous contrast. RADIATION DOSE REDUCTION:  This exam was performed according to the departmental dose-optimization program which includes automated exposure control, adjustment of the mA and/or kV according to patient size and/or use of iterative reconstruction technique. CONTRAST:  60mL OMNIPAQUE  IOHEXOL  300 MG/ML  SOLN COMPARISON:  04/03/2023 FINDINGS: Lower chest: No pleural or pericardial effusion. Visualized lung bases clear. Hepatobiliary: No focal liver abnormality is seen. No gallstones, gallbladder wall thickening, or biliary dilatation. Pancreas: Unremarkable. No pancreatic  ductal dilatation or surrounding inflammatory changes. Spleen: Normal in size without focal abnormality. Adrenals/Urinary Tract: No adrenal mass. Symmetric renal enhancement without focal lesion, hydronephrosis, or evident urolithiasis. Foley catheter decompresses the urinary bladder with diffuse moderate bladder wall thickening suspected. Stomach/Bowel: Stomach is partially distended, without acute finding. Small bowel decompressed. Post appendectomy. The colon is partially distended, with scattered diverticula predominately in the ascending segment; no adjacent inflammatory change. Vascular/Lymphatic: Moderate calcified aortoiliac plaque without AAA. Portal vein patent. No abdominal or pelvic adenopathy. Reproductive: Status post hysterectomy. No adnexal masses. Other: No ascites.  No free air. Musculoskeletal: No acute or significant osseous findings. IMPRESSION: 1. Diffuse bladder wall thickening. 2. Ascending colonic diverticulosis. 3.  Aortic Atherosclerosis (ICD10-I70.0). Electronically Signed   By: Nicoletta Barrier M.D.   On: 04/28/2023 15:56    Labs:  CBC: Recent Labs    04/03/23 1359 04/12/23 1008 04/14/23 0437 04/28/23 1410  WBC 9.9 7.8 6.3 9.8  HGB 11.7* 12.2 11.7* 12.8  HCT 33.5* 36.9 34.3* 37.3  PLT 269 251 253 299    COAGS: No results for input(s): "INR", "APTT" in the last 8760 hours.  BMP: Recent Labs    10/27/22 1358 01/12/23 1459  04/03/23 1359 04/12/23 1008 04/14/23 0437 04/28/23 1410  NA 138   < > 128* 136 136 136  K 3.4*   < > 3.7 3.9 4.0 4.3  CL 102   < > 97* 98 104 102  CO2 24   < > 18* 16* 24 21*  GLUCOSE 135*   < > 379* 361* 224* 183*  BUN 39*   < > 22 33* 27* 26*  CALCIUM  9.4   < > 8.6* 8.9 8.9 9.4  CREATININE 1.66*   < > 1.82* 1.65* 1.81* 1.80*  GFRNONAA 34*  --  30*  --  30* 31*   < > = values in this interval not displayed.    LIVER FUNCTION TESTS: Recent Labs    04/03/23 1359 04/12/23 1008 04/14/23 0437 04/28/23 1410  BILITOT 0.2 0.2 0.8 0.5  AST 40 44* 54* 51*  ALT 30 37* 35 37  ALKPHOS 307* 546* 385* 329*  PROT 7.1 7.1 7.3 7.8  ALBUMIN 3.3* 4.3 3.6 3.7    TUMOR MARKERS: No results for input(s): "AFPTM", "CEA", "CA199", "CHROMGRNA" in the last 8760 hours.  Assessment and Plan:  Karen [name] is a 68 y.o. 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  Has documented incomplete emptying back to 2017 when pt was seen with [facility] and [facility]. Recently saw Dr. Estanislao Heimlich with local urology group and discussed with patient options going forward. Currently she is requiring chronic foley catheter. Has failed at least 5 voiding trials in the last 2 years with hydronephrosis demonstrated when catheter was removed. Option was given to patient for retaining chronic foley, intermittent cath multiple times per day, or suprapubic catheter. Pt did not want to keep the chronic foley and felt she could not intermittently cath herself, so she opted to proceed with suprapubic catheter placement, which is reason for patient visit to IR today.    Risks and benefits discussed with the patient including bleeding, infection, damage to adjacent structures, bladder perforation/fistula connection, and sepsis. All of the patient's questions were answered, patient is agreeable to proceed. Consent signed and in chart.   Thank you for allowing our service  to participate in ALEKSANDRA STITH 's care.  Electronically Signed: Nicolasa Barrett, PA-C  05/19/2023, 3:37 PM      I spent a total of {New INPT:304952001} {New Out-Pt:304952002}  {Established Out-Pt:304952003} in face to face in clinical consultation, greater than 50% of which was counseling/coordinating care for suprapubic catheter placement.

## 2023-05-20 ENCOUNTER — Ambulatory Visit
Admission: RE | Admit: 2023-05-20 | Discharge: 2023-05-20 | Disposition: A | Discharge status: Home / Self Care | Source: Ambulatory Visit | Attending: Urology | Admitting: Urology

## 2023-05-20 ENCOUNTER — Encounter: Payer: Self-pay | Admitting: Radiology

## 2023-05-20 VITALS — BP 146/74 | HR 72 | Temp 97.7°F | Resp 21 | Ht 61.0 in | Wt 99.0 lb

## 2023-05-20 DIAGNOSIS — Z87891 Personal history of nicotine dependence: Secondary | ICD-10-CM | POA: Diagnosis not present

## 2023-05-20 DIAGNOSIS — N312 Flaccid neuropathic bladder, not elsewhere classified: Secondary | ICD-10-CM | POA: Diagnosis not present

## 2023-05-20 DIAGNOSIS — Z87440 Personal history of urinary (tract) infections: Secondary | ICD-10-CM | POA: Insufficient documentation

## 2023-05-20 DIAGNOSIS — Z7985 Long-term (current) use of injectable non-insulin antidiabetic drugs: Secondary | ICD-10-CM | POA: Insufficient documentation

## 2023-05-20 DIAGNOSIS — E1122 Type 2 diabetes mellitus with diabetic chronic kidney disease: Secondary | ICD-10-CM | POA: Diagnosis not present

## 2023-05-20 DIAGNOSIS — R339 Retention of urine, unspecified: Secondary | ICD-10-CM

## 2023-05-20 DIAGNOSIS — Z79899 Other long term (current) drug therapy: Secondary | ICD-10-CM | POA: Diagnosis not present

## 2023-05-20 DIAGNOSIS — N319 Neuromuscular dysfunction of bladder, unspecified: Secondary | ICD-10-CM | POA: Insufficient documentation

## 2023-05-20 DIAGNOSIS — Z794 Long term (current) use of insulin: Secondary | ICD-10-CM | POA: Insufficient documentation

## 2023-05-20 DIAGNOSIS — Z7984 Long term (current) use of oral hypoglycemic drugs: Secondary | ICD-10-CM | POA: Diagnosis not present

## 2023-05-20 DIAGNOSIS — I129 Hypertensive chronic kidney disease with stage 1 through stage 4 chronic kidney disease, or unspecified chronic kidney disease: Secondary | ICD-10-CM | POA: Diagnosis not present

## 2023-05-20 DIAGNOSIS — N189 Chronic kidney disease, unspecified: Secondary | ICD-10-CM | POA: Diagnosis not present

## 2023-05-20 HISTORY — PX: IR CYSTOSTOMY TUBE PLACEMENT/BLADDER ASPIRATION: IMG1097

## 2023-05-20 LAB — CBC WITH DIFFERENTIAL/PLATELET
Abs Immature Granulocytes: 0.06 10*3/uL (ref 0.00–0.07)
Basophils Absolute: 0.1 10*3/uL (ref 0.0–0.1)
Basophils Relative: 1 %
Eosinophils Absolute: 0.1 10*3/uL (ref 0.0–0.5)
Eosinophils Relative: 1 %
HCT: 36.3 % (ref 36.0–46.0)
Hemoglobin: 12.5 g/dL (ref 12.0–15.0)
Immature Granulocytes: 1 %
Lymphocytes Relative: 33 %
Lymphs Abs: 2.6 10*3/uL (ref 0.7–4.0)
MCH: 30.3 pg (ref 26.0–34.0)
MCHC: 34.4 g/dL (ref 30.0–36.0)
MCV: 87.9 fL (ref 80.0–100.0)
Monocytes Absolute: 0.6 10*3/uL (ref 0.1–1.0)
Monocytes Relative: 7 %
Neutro Abs: 4.5 10*3/uL (ref 1.7–7.7)
Neutrophils Relative %: 57 %
Platelets: 287 10*3/uL (ref 150–400)
RBC: 4.13 MIL/uL (ref 3.87–5.11)
RDW: 11.9 % (ref 11.5–15.5)
WBC: 7.8 10*3/uL (ref 4.0–10.5)
nRBC: 0 % (ref 0.0–0.2)

## 2023-05-20 LAB — GLUCOSE, CAPILLARY: Glucose-Capillary: 203 mg/dL — ABNORMAL HIGH (ref 70–99)

## 2023-05-20 LAB — PROTIME-INR
INR: 1 (ref 0.8–1.2)
Prothrombin Time: 13 s (ref 11.4–15.2)

## 2023-05-20 MED ORDER — ONDANSETRON HCL 4 MG/2ML IJ SOLN
INTRAMUSCULAR | Status: AC
Start: 2023-05-20 — End: 2023-05-20
  Administered 2023-05-20: 4 mg via INTRAVENOUS
  Filled 2023-05-20: qty 2

## 2023-05-20 MED ORDER — HYDROMORPHONE HCL 1 MG/ML IJ SOLN
INTRAMUSCULAR | Status: AC
  Filled 2023-05-20: qty 1

## 2023-05-20 MED ORDER — ONDANSETRON 4 MG PO TBDP
4.0000 mg | ORAL_TABLET | Freq: Once | ORAL | Status: AC
  Administered 2023-05-20: 4 mg via ORAL
  Filled 2023-05-20: qty 1

## 2023-05-20 MED ORDER — LIDOCAINE HCL 1 % IJ SOLN
10.0000 mL | Freq: Once | INTRAMUSCULAR | Status: AC
Start: 2023-05-20 — End: 2023-05-20
  Administered 2023-05-20: 10 mL via INTRADERMAL

## 2023-05-20 MED ORDER — SODIUM CHLORIDE 0.9 % IV SOLN
INTRAVENOUS | Status: DC
Start: 2023-05-20 — End: 2023-05-21

## 2023-05-20 MED ORDER — KETOROLAC TROMETHAMINE 15 MG/ML IJ SOLN
15.0000 mg | Freq: Once | INTRAMUSCULAR | Status: AC
  Administered 2023-05-20: 15 mg via INTRAVENOUS
  Filled 2023-05-20: qty 1

## 2023-05-20 MED ORDER — LIDOCAINE VISCOUS HCL 2 % MT SOLN
OROMUCOSAL | Status: AC
  Filled 2023-05-20: qty 15

## 2023-05-20 MED ORDER — ONDANSETRON HCL 4 MG/2ML IJ SOLN: 4.0000 mg | Freq: Once | INTRAMUSCULAR | Status: AC

## 2023-05-20 MED ORDER — ONDANSETRON 4 MG PO TBDP
ORAL_TABLET | ORAL | Status: AC
  Filled 2023-05-20: qty 1

## 2023-05-20 MED ORDER — MIDAZOLAM HCL 2 MG/2ML IJ SOLN
INTRAMUSCULAR | Status: AC
  Filled 2023-05-20: qty 2

## 2023-05-20 MED ORDER — LIDOCAINE HCL 1 % IJ SOLN
INTRAMUSCULAR | Status: AC
  Filled 2023-05-20: qty 20

## 2023-05-20 MED ORDER — IOHEXOL 300 MG/ML  SOLN
10.0000 mL | Freq: Once | INTRAMUSCULAR | Status: AC | PRN
Start: 2023-05-20 — End: 2023-05-20
  Administered 2023-05-20: 10 mL

## 2023-05-20 MED ORDER — MIDAZOLAM HCL 2 MG/2ML IJ SOLN
INTRAMUSCULAR | Status: AC | PRN
  Administered 2023-05-20: 1 mg via INTRAVENOUS

## 2023-05-20 MED ORDER — ONDANSETRON HCL 4 MG/2ML IJ SOLN
4.0000 mg | Freq: Once | INTRAMUSCULAR | Status: AC
  Administered 2023-05-20: 4 mg via INTRAVENOUS
  Filled 2023-05-20: qty 2

## 2023-05-20 MED ORDER — HYDROMORPHONE HCL 1 MG/ML IJ SOLN
INTRAMUSCULAR | Status: AC | PRN
  Administered 2023-05-20 (×2): .5 mg via INTRAVENOUS

## 2023-05-20 NOTE — OR Nursing (Signed)
 Md notified of patient nausea, Zofran  4 mg ordered and given

## 2023-05-20 NOTE — Discharge Instructions (Signed)
 Shuntogram, Care After Refer to this sheet in the next few weeks. These instructions provide you with information on caring for yourself after your procedure. Your health care provider may also give you more specific instructions. Your treatment has been planned according to current medical practices, but problems sometimes occur. Call your health care provider if you have any problems or questions after your procedure. What can I expect after the procedure? After your procedure, it is typical to have the following:  A small amount of discomfort in the area where the catheters were placed.  A small amount of bruising around the fistula.  Sleepiness and fatigue.  Follow these instructions at home:  Rest at home for the day following your procedure.  Do not drive or operate heavy machinery while taking pain medicine.  Take medicines only as directed by your health care provider.  Do not take baths, swim, or use a hot tub until your health care provider approves. You may shower 24 hours after the procedure or as directed by your health care provider.  There are many different ways to close and cover an incision, including stitches, skin glue, and adhesive strips. Follow your health care provider's instructions on: ? Incision care. ? Bandage (dressing) changes and removal. ? Incision closure removal.  Monitor your dialysis fistula carefully. Contact a health care provider if:  You have drainage, redness, swelling, or pain at your catheter site.  You have a fever.  You have chills. Get help right away if:  You feel weak.  You have trouble balancing.  You have trouble moving your arms or legs.  You have problems with your speech or vision.  You can no longer feel a vibration or buzz when you put your fingers over your dialysis fistula.  The limb that was used for the procedure: ? Swells. ? Is painful. ? Is cold. ? Is discolored, such as blue or pale white. This  information is not intended to replace advice given to you by your health care provider. Make sure you discuss any questions you have with your health care provider. Document Released: 05/08/2013 Document Revised: 05/30/2015 Document Reviewed: 02/10/2013 Elsevier Interactive Patient Education  2018 [employer].

## 2023-05-20 NOTE — Procedures (Signed)
 Interventional Radiology Procedure Note  Procedure: Image guided supra-pubic catheter placement, 62F council tip foley.  Complications: None  EBL: None    Recommendations: - Routine SPT care - advance diet - ice as needed - 15mg  IV toradol  - routine wound care, dry dressing changes as needed, bacitracin/triple ABX ointment is ok as needed - shower ok in 24 hours, do not submerge until 1-2 weeks - DC in 1 hr when goals met  Signed,  Marciano Settles. Mabel Savage, DO

## 2023-05-20 NOTE — Progress Notes (Signed)
 Pt. Continued to have vomiting and belching, despite IV zofran  x2. Dr. Mabel Savage in now to see pt.. MD states o.k. for pt. To be Dc'd home & take her own meds. For HA. Pt. Agreeable and expresses wishes to go home. Suprapubic tube intact to drainage.

## 2023-05-21 ENCOUNTER — Other Ambulatory Visit: Payer: Self-pay

## 2023-05-21 ENCOUNTER — Emergency Department

## 2023-05-21 ENCOUNTER — Inpatient Hospital Stay
Admission: EM | Admit: 2023-05-21 | Discharge: 2023-05-27 | DRG: 663 | Disposition: A | Discharge status: Home / Self Care | Attending: Hospitalist | Admitting: Hospitalist

## 2023-05-21 DIAGNOSIS — E1165 Type 2 diabetes mellitus with hyperglycemia: Secondary | ICD-10-CM | POA: Diagnosis present

## 2023-05-21 DIAGNOSIS — Z91030 Bee allergy status: Secondary | ICD-10-CM

## 2023-05-21 DIAGNOSIS — I10 Essential (primary) hypertension: Secondary | ICD-10-CM | POA: Diagnosis present

## 2023-05-21 DIAGNOSIS — R339 Retention of urine, unspecified: Secondary | ICD-10-CM | POA: Diagnosis present

## 2023-05-21 DIAGNOSIS — I129 Hypertensive chronic kidney disease with stage 1 through stage 4 chronic kidney disease, or unspecified chronic kidney disease: Secondary | ICD-10-CM | POA: Diagnosis present

## 2023-05-21 DIAGNOSIS — T402X5A Adverse effect of other opioids, initial encounter: Secondary | ICD-10-CM | POA: Diagnosis present

## 2023-05-21 DIAGNOSIS — E785 Hyperlipidemia, unspecified: Secondary | ICD-10-CM | POA: Diagnosis present

## 2023-05-21 DIAGNOSIS — N184 Chronic kidney disease, stage 4 (severe): Secondary | ICD-10-CM | POA: Diagnosis present

## 2023-05-21 DIAGNOSIS — E1142 Type 2 diabetes mellitus with diabetic polyneuropathy: Secondary | ICD-10-CM | POA: Diagnosis present

## 2023-05-21 DIAGNOSIS — Z7985 Long-term (current) use of injectable non-insulin antidiabetic drugs: Secondary | ICD-10-CM

## 2023-05-21 DIAGNOSIS — E7800 Pure hypercholesterolemia, unspecified: Secondary | ICD-10-CM | POA: Diagnosis present

## 2023-05-21 DIAGNOSIS — N179 Acute kidney failure, unspecified: Secondary | ICD-10-CM | POA: Diagnosis present

## 2023-05-21 DIAGNOSIS — Z833 Family history of diabetes mellitus: Secondary | ICD-10-CM

## 2023-05-21 DIAGNOSIS — E1129 Type 2 diabetes mellitus with other diabetic kidney complication: Secondary | ICD-10-CM | POA: Diagnosis present

## 2023-05-21 DIAGNOSIS — N3289 Other specified disorders of bladder: Secondary | ICD-10-CM | POA: Diagnosis present

## 2023-05-21 DIAGNOSIS — K860 Alcohol-induced chronic pancreatitis: Secondary | ICD-10-CM | POA: Diagnosis present

## 2023-05-21 DIAGNOSIS — Z888 Allergy status to other drugs, medicaments and biological substances status: Secondary | ICD-10-CM

## 2023-05-21 DIAGNOSIS — D62 Acute posthemorrhagic anemia: Secondary | ICD-10-CM | POA: Diagnosis present

## 2023-05-21 DIAGNOSIS — F17200 Nicotine dependence, unspecified, uncomplicated: Secondary | ICD-10-CM | POA: Diagnosis present

## 2023-05-21 DIAGNOSIS — Z880 Allergy status to penicillin: Secondary | ICD-10-CM

## 2023-05-21 DIAGNOSIS — Y738 Miscellaneous gastroenterology and urology devices associated with adverse incidents, not elsewhere classified: Secondary | ICD-10-CM | POA: Diagnosis present

## 2023-05-21 DIAGNOSIS — R319 Hematuria, unspecified: Secondary | ICD-10-CM | POA: Diagnosis present

## 2023-05-21 DIAGNOSIS — E1136 Type 2 diabetes mellitus with diabetic cataract: Secondary | ICD-10-CM | POA: Diagnosis present

## 2023-05-21 DIAGNOSIS — Z79899 Other long term (current) drug therapy: Secondary | ICD-10-CM

## 2023-05-21 DIAGNOSIS — J45909 Unspecified asthma, uncomplicated: Secondary | ICD-10-CM | POA: Diagnosis present

## 2023-05-21 DIAGNOSIS — E1169 Type 2 diabetes mellitus with other specified complication: Secondary | ICD-10-CM | POA: Diagnosis present

## 2023-05-21 DIAGNOSIS — R310 Gross hematuria: Secondary | ICD-10-CM | POA: Diagnosis present

## 2023-05-21 DIAGNOSIS — Z794 Long term (current) use of insulin: Secondary | ICD-10-CM

## 2023-05-21 DIAGNOSIS — E1122 Type 2 diabetes mellitus with diabetic chronic kidney disease: Secondary | ICD-10-CM | POA: Diagnosis present

## 2023-05-21 DIAGNOSIS — Z87891 Personal history of nicotine dependence: Secondary | ICD-10-CM

## 2023-05-21 DIAGNOSIS — M62838 Other muscle spasm: Secondary | ICD-10-CM | POA: Diagnosis present

## 2023-05-21 DIAGNOSIS — N319 Neuromuscular dysfunction of bladder, unspecified: Secondary | ICD-10-CM | POA: Diagnosis present

## 2023-05-21 DIAGNOSIS — F419 Anxiety disorder, unspecified: Secondary | ICD-10-CM | POA: Diagnosis present

## 2023-05-21 DIAGNOSIS — T83090A Other mechanical complication of cystostomy catheter, initial encounter: Principal | ICD-10-CM | POA: Diagnosis present

## 2023-05-21 DIAGNOSIS — K5903 Drug induced constipation: Secondary | ICD-10-CM | POA: Diagnosis present

## 2023-05-21 DIAGNOSIS — Z7984 Long term (current) use of oral hypoglycemic drugs: Secondary | ICD-10-CM

## 2023-05-21 DIAGNOSIS — Z885 Allergy status to narcotic agent status: Secondary | ICD-10-CM

## 2023-05-21 DIAGNOSIS — Z9189 Other specified personal risk factors, not elsewhere classified: Secondary | ICD-10-CM

## 2023-05-21 DIAGNOSIS — F1020 Alcohol dependence, uncomplicated: Secondary | ICD-10-CM | POA: Diagnosis present

## 2023-05-21 DIAGNOSIS — G4700 Insomnia, unspecified: Secondary | ICD-10-CM | POA: Diagnosis present

## 2023-05-21 DIAGNOSIS — E041 Nontoxic single thyroid nodule: Secondary | ICD-10-CM

## 2023-05-21 DIAGNOSIS — E871 Hypo-osmolality and hyponatremia: Secondary | ICD-10-CM | POA: Diagnosis present

## 2023-05-21 LAB — BASIC METABOLIC PANEL WITH GFR
Anion gap: 11 (ref 5–15)
BUN: 33 mg/dL — ABNORMAL HIGH (ref 8–23)
CO2: 21 mmol/L — ABNORMAL LOW (ref 22–32)
Calcium: 8.7 mg/dL — ABNORMAL LOW (ref 8.9–10.3)
Chloride: 102 mmol/L (ref 98–111)
Creatinine, Ser: 2.54 mg/dL — ABNORMAL HIGH (ref 0.44–1.00)
GFR, Estimated: 20 mL/min — ABNORMAL LOW
Glucose, Bld: 228 mg/dL — ABNORMAL HIGH (ref 70–99)
Potassium: 4.5 mmol/L (ref 3.5–5.1)
Sodium: 134 mmol/L — ABNORMAL LOW (ref 135–145)

## 2023-05-21 LAB — GLUCOSE, CAPILLARY
Glucose-Capillary: 220 mg/dL — ABNORMAL HIGH (ref 70–99)
Glucose-Capillary: 190 mg/dL — ABNORMAL HIGH (ref 70–99)

## 2023-05-21 LAB — CBC WITH DIFFERENTIAL/PLATELET
Abs Immature Granulocytes: 0.13 10*3/uL — ABNORMAL HIGH (ref 0.00–0.07)
Basophils Absolute: 0.1 10*3/uL (ref 0.0–0.1)
Basophils Relative: 0 %
Eosinophils Absolute: 0 10*3/uL (ref 0.0–0.5)
Eosinophils Relative: 0 %
HCT: 31.4 % — ABNORMAL LOW (ref 36.0–46.0)
Hemoglobin: 10.7 g/dL — ABNORMAL LOW (ref 12.0–15.0)
Immature Granulocytes: 1 %
Lymphocytes Relative: 10 %
Lymphs Abs: 1.8 10*3/uL (ref 0.7–4.0)
MCH: 30.4 pg (ref 26.0–34.0)
MCHC: 34.1 g/dL (ref 30.0–36.0)
MCV: 89.2 fL (ref 80.0–100.0)
Monocytes Absolute: 1.2 10*3/uL — ABNORMAL HIGH (ref 0.1–1.0)
Monocytes Relative: 7 %
Neutro Abs: 14.1 10*3/uL — ABNORMAL HIGH (ref 1.7–7.7)
Neutrophils Relative %: 82 %
Platelets: 257 10*3/uL (ref 150–400)
RBC: 3.52 MIL/uL — ABNORMAL LOW (ref 3.87–5.11)
RDW: 12.2 % (ref 11.5–15.5)
WBC: 17.3 10*3/uL — ABNORMAL HIGH (ref 4.0–10.5)
nRBC: 0 % (ref 0.0–0.2)

## 2023-05-21 MED ORDER — ONDANSETRON HCL 4 MG/2ML IJ SOLN
4.0000 mg | Freq: Four times a day (QID) | INTRAMUSCULAR | Status: AC | PRN
  Administered 2023-05-21 – 2023-05-25 (×3): 4 mg via INTRAVENOUS
  Filled 2023-05-21 (×3): qty 2

## 2023-05-21 MED ORDER — MORPHINE SULFATE (PF) 4 MG/ML IV SOLN
4.0000 mg | INTRAVENOUS | Status: AC | PRN
  Administered 2023-05-21 – 2023-05-22 (×4): 4 mg via INTRAVENOUS
  Filled 2023-05-21 (×4): qty 1

## 2023-05-21 MED ORDER — MORPHINE SULFATE (PF) 4 MG/ML IV SOLN
4.0000 mg | Freq: Once | INTRAVENOUS | Status: AC
  Administered 2023-05-21: 4 mg via INTRAVENOUS
  Filled 2023-05-21: qty 1

## 2023-05-21 MED ORDER — ACETAMINOPHEN 650 MG RE SUPP: 650.0000 mg | Freq: Four times a day (QID) | RECTAL | Status: AC | PRN

## 2023-05-21 MED ORDER — DIAZEPAM 2 MG PO TABS
2.0000 mg | ORAL_TABLET | Freq: Two times a day (BID) | ORAL | Status: DC | PRN
  Administered 2023-05-22 – 2023-05-26 (×4): 2 mg via ORAL
  Filled 2023-05-21 (×4): qty 1

## 2023-05-21 MED ORDER — ICOSAPENT ETHYL 1 G PO CAPS
2.0000 g | ORAL_CAPSULE | Freq: Two times a day (BID) | ORAL | Status: DC
  Administered 2023-05-21 – 2023-05-27 (×12): 2 g via ORAL
  Filled 2023-05-21 (×12): qty 2

## 2023-05-21 MED ORDER — ZOLPIDEM TARTRATE 5 MG PO TABS
5.0000 mg | ORAL_TABLET | Freq: Every evening | ORAL | Status: DC | PRN
  Administered 2023-05-21 – 2023-05-25 (×4): 5 mg via ORAL
  Filled 2023-05-21 (×5): qty 1

## 2023-05-21 MED ORDER — ONDANSETRON HCL 4 MG PO TABS: 4.0000 mg | ORAL_TABLET | Freq: Four times a day (QID) | ORAL | Status: AC | PRN

## 2023-05-21 MED ORDER — SENNOSIDES-DOCUSATE SODIUM 8.6-50 MG PO TABS
1.0000 | ORAL_TABLET | Freq: Every evening | ORAL | Status: DC | PRN
  Administered 2023-05-21 – 2023-05-23 (×2): 1 via ORAL
  Filled 2023-05-21 (×2): qty 1

## 2023-05-21 MED ORDER — ORAL CARE MOUTH RINSE: 15.0000 mL | OROMUCOSAL | Status: DC | PRN

## 2023-05-21 MED ORDER — PANTOPRAZOLE SODIUM 40 MG PO TBEC
80.0000 mg | DELAYED_RELEASE_TABLET | Freq: Every day | ORAL | Status: DC
  Administered 2023-05-22 – 2023-05-27 (×6): 80 mg via ORAL
  Filled 2023-05-21 (×6): qty 2

## 2023-05-21 MED ORDER — HYDROXYZINE HCL 25 MG PO TABS
25.0000 mg | ORAL_TABLET | Freq: Every day | ORAL | Status: DC | PRN
  Administered 2023-05-22 – 2023-05-26 (×3): 25 mg via ORAL
  Filled 2023-05-21 (×3): qty 1

## 2023-05-21 MED ORDER — AMLODIPINE BESYLATE 10 MG PO TABS
10.0000 mg | ORAL_TABLET | Freq: Every day | ORAL | Status: DC
  Administered 2023-05-22 – 2023-05-27 (×6): 10 mg via ORAL
  Filled 2023-05-21 (×6): qty 1

## 2023-05-21 MED ORDER — FAMOTIDINE 20 MG PO TABS
20.0000 mg | ORAL_TABLET | Freq: Every day | ORAL | Status: DC
  Administered 2023-05-21 – 2023-05-26 (×6): 20 mg via ORAL
  Filled 2023-05-21 (×6): qty 1

## 2023-05-21 MED ORDER — CYCLOBENZAPRINE HCL 10 MG PO TABS
5.0000 mg | ORAL_TABLET | Freq: Two times a day (BID) | ORAL | Status: DC | PRN
  Administered 2023-05-22 – 2023-05-26 (×6): 5 mg via ORAL
  Filled 2023-05-21 (×6): qty 1

## 2023-05-21 MED ORDER — POLYETHYLENE GLYCOL 3350 17 G PO PACK
17.0000 g | PACK | Freq: Every day | ORAL | Status: DC
  Administered 2023-05-21 – 2023-05-25 (×3): 17 g via ORAL
  Filled 2023-05-21 (×4): qty 1

## 2023-05-21 MED ORDER — INSULIN ASPART 100 UNIT/ML IJ SOLN
0.0000 [IU] | Freq: Three times a day (TID) | INTRAMUSCULAR | Status: DC
  Administered 2023-05-22: 1 [IU] via SUBCUTANEOUS
  Administered 2023-05-22: 2 [IU] via SUBCUTANEOUS
  Administered 2023-05-23: 3 [IU] via SUBCUTANEOUS
  Administered 2023-05-23: 1 [IU] via SUBCUTANEOUS
  Administered 2023-05-23: 3 [IU] via SUBCUTANEOUS
  Administered 2023-05-24: 7 [IU] via SUBCUTANEOUS
  Administered 2023-05-24 (×2): 3 [IU] via SUBCUTANEOUS
  Administered 2023-05-25: 2 [IU] via SUBCUTANEOUS
  Administered 2023-05-25: 3 [IU] via SUBCUTANEOUS
  Administered 2023-05-26: 1 [IU] via SUBCUTANEOUS
  Administered 2023-05-26: 2 [IU] via SUBCUTANEOUS
  Administered 2023-05-27: 1 [IU] via SUBCUTANEOUS
  Filled 2023-05-21 (×14): qty 1

## 2023-05-21 MED ORDER — HYDROCODONE-ACETAMINOPHEN 5-325 MG PO TABS
1.0000 | ORAL_TABLET | Freq: Four times a day (QID) | ORAL | Status: DC | PRN
  Administered 2023-05-22 – 2023-05-27 (×12): 1 via ORAL
  Filled 2023-05-21 (×13): qty 1

## 2023-05-21 MED ORDER — SODIUM CHLORIDE 0.9 % IV SOLN: INTRAVENOUS | Status: AC

## 2023-05-21 MED ORDER — ACETAMINOPHEN 325 MG PO TABS
650.0000 mg | ORAL_TABLET | Freq: Four times a day (QID) | ORAL | Status: AC | PRN
  Administered 2023-05-21 – 2023-05-25 (×3): 650 mg via ORAL
  Filled 2023-05-21 (×3): qty 2

## 2023-05-21 MED ORDER — SODIUM CHLORIDE 0.9 % IR SOLN
3000.0000 mL | Status: DC
  Administered 2023-05-21 – 2023-05-26 (×37): 3000 mL

## 2023-05-21 MED ORDER — NORTRIPTYLINE HCL 25 MG PO CAPS
25.0000 mg | ORAL_CAPSULE | Freq: Every day | ORAL | Status: DC
  Administered 2023-05-21 – 2023-05-26 (×6): 25 mg via ORAL
  Filled 2023-05-21 (×6): qty 1

## 2023-05-21 MED ORDER — INSULIN ASPART 100 UNIT/ML IJ SOLN
0.0000 [IU] | Freq: Every day | INTRAMUSCULAR | Status: DC
  Administered 2023-05-23 – 2023-05-24 (×2): 2 [IU] via SUBCUTANEOUS
  Administered 2023-05-25: 5 [IU] via SUBCUTANEOUS
  Filled 2023-05-21 (×3): qty 1

## 2023-05-21 MED ORDER — ENSURE ENLIVE PO LIQD
237.0000 mL | Freq: Two times a day (BID) | ORAL | Status: DC
  Administered 2023-05-23 – 2023-05-26 (×8): 237 mL via ORAL

## 2023-05-21 MED ORDER — ARIPIPRAZOLE 10 MG PO TABS
5.0000 mg | ORAL_TABLET | Freq: Every day | ORAL | Status: DC
  Administered 2023-05-22 – 2023-05-27 (×3): 5 mg via ORAL
  Filled 2023-05-21 (×5): qty 1

## 2023-05-21 MED ORDER — MORPHINE SULFATE (PF) 2 MG/ML IV SOLN: 2.0000 mg | INTRAVENOUS | Status: DC | PRN

## 2023-05-21 MED ORDER — LACTATED RINGERS IV BOLUS: 1000.0000 mL | Freq: Once | INTRAVENOUS | Status: DC

## 2023-05-21 MED ORDER — PANCRELIPASE (LIP-PROT-AMYL) 36000-114000 UNITS PO CPEP
72000.0000 [IU] | ORAL_CAPSULE | Freq: Three times a day (TID) | ORAL | Status: DC
  Administered 2023-05-22 – 2023-05-26 (×15): 72000 [IU] via ORAL
  Filled 2023-05-21 (×18): qty 2

## 2023-05-21 NOTE — Assessment & Plan Note (Signed)
 Pancrelipase  resumed on admission

## 2023-05-21 NOTE — Assessment & Plan Note (Addendum)
 Current home insulin  regimen not resumed on admission SSI with at bedtime coverage ordered, renal dosing

## 2023-05-21 NOTE — H&P (Signed)
 History and Physical   Karen [name] WGN:[id number] DOB: 12/20/1955 DOA: 05/21/2023  PCP: Trenda Frisk, FNP  Outpatient Specialists: Dr. Estanislao Heimlich, urology Patient coming from: home  I have personally briefly reviewed patient's old medical records in [facility] EMR.  Chief Concern: Hematuria  HPI: Ms. Karen [name] is a 68 year old female with history of insulin -dependent diabetes mellitus, hypertension, hyperlipidemia, insomnia, neurogenic bladder secondary to poorly controlled diabetes mellitus, who presents emergency department for chief concerns of hematuria.  Vitals in the ED showed T max of 98.6, rr of 18, hr 90, pressure 125/68, SpO2 99% on room air.  Serum sodium is 134, potassium 4.5, chloride 102, bicarb 21, BUN of 33, serum creatinine 2.54,EGFR of 20, WBC 17.3, hemoglobin 10.7, platelets of 257.  ED treatment: Morphine  4 mg IV 2 doses, sodium chloride  3 L bolus.  EDP discussed with IR who recommends against exchanging catheter at this time due to it being too early and immature tract.  High risk of losing access with new placement. ------------------------- At bedside, patient is able to tell me her first and last name, age, location, current calendar year.  She had suprapubic catheter placement yesterday, 5/15.  This morning she noticed blood in the urine bag.  She denies trauma to her person.  She denies fever, chills, abdominal pain, nausea, vomiting.  Social history: She lives at home with Mr. Rochelle Chu, her partner of 19 years.  She denies current tobacco, EtOH, recreational drug use.  She is disabled.  ROS: Constitutional: no weight change, no fever ENT/Mouth: no sore throat, no rhinorrhea Eyes: no eye pain, no vision changes Cardiovascular: no chest pain, no dyspnea,  no edema, no palpitations Respiratory: no cough, no sputum, no wheezing Gastrointestinal: no nausea, no vomiting, no diarrhea, no constipation Genitourinary: no urinary incontinence, no dysuria, +  hematuria Musculoskeletal: no arthralgias, no myalgias Skin: no skin lesions, no pruritus, Neuro: no weakness, no loss of consciousness, no syncope Psych: no anxiety, no depression, no decrease appetite Heme/Lymph: no bruising, no bleeding  ED Course: Discussed with EDP, patient requiring hospitalization for chief concerns of hematuria, acute kidney injury.  Assessment/Plan  Principal Problem:   Hematuria Active Problems:   Hypertension   HLD (hyperlipidemia)   Type 2 diabetes mellitus with hyperlipidemia (HCC)   AKI (acute kidney injury) (HCC)   Type II diabetes mellitus with renal manifestations (HCC)   Diabetic polyneuropathy associated with type 2 diabetes mellitus (HCC)   Urinary retention   Therapeutic opioid induced constipation   Chronic pancreatitis due to chronic alcoholism (HCC)   Hyponatremia   At risk for polypharmacy   Assessment and Plan:  * Hematuria Status post three-way Foley catheter placement via urethra with CBI Strict I's and O's Sodium chloride  75 mL/h, 1 day ordered  At risk for polypharmacy PDMP reviewed Home Valium  2 mg every 12 hours as needed for muscle spasms, resumed as I do not want patient to withdrawal Zolpidem  10 mg nightly changed to 5 mg nightly for insomnia  Chronic pancreatitis due to chronic alcoholism (HCC) Pancrelipase  resumed on admission  AKI (acute kidney injury) (HCC) Suspect secondary to obstructive uropathy Strict I's and O's Sodium chloride  infusion at 75 mL/h, 1 day ordered Check a UA tomorrow.  I did not order it today due to hematuria and this would cause color interference  Type 2 diabetes mellitus with hyperlipidemia (HCC) Current home insulin  regimen not resumed on admission SSI with at bedtime coverage ordered, renal dosing  Hypertension Home amlodipine  10 mg daily  resumed Home hydrochlorothiazide  not resumed on admission in setting of AKI  Chart reviewed.   DVT prophylaxis: TED hose; AM team to initiate  pharmacologic DVT prophylaxis when the benefits outweigh the risk Code Status: Full code Diet: Heart healthy/carb modified Family Communication: A phone call was offered, patient declined stating that Mr. Rochelle Chu already knows she is being admitted to the hospital Disposition Plan: Pending clinical course Consults called: IR per EDP Admission status: Telemetry surgical, observation  Past Medical History:  Diagnosis Date   Abdominal pain    Acute cystitis 05/18/2020   Acute kidney injury superimposed on CKD (HCC)    Acute on chronic pancreatitis (HCC) 01/03/2017   Acute renal insufficiency 10/29/2015   Adhesive capsulitis of left shoulder    Alcoholic pancreatitis    Asthma    Depression    Diabetes mellitus without complication (HCC)    Diabetic cataract of right eye (HCC) 08/31/2019   Formatting of this note might be different from the original. Added automatically from request for surgery [id number]   DKA (diabetic ketoacidoses) 02/17/2015   DKA (diabetic ketoacidosis) (HCC) 02/17/2015   IMO SNOMED Dx Update Oct 2024     Elevated alkaline phosphatase level 10/30/2021   Emphysematous cystitis    Esophageal candidiasis (HCC)    ETOH abuse 01/03/2017   Headache 02/26/2019   Hypercholesteremia    Hypertension    Hypertensive urgency 04/21/2016   Hypokalemia    Left flank pain 10/29/2015   Left upper quadrant abdominal pain 05/18/2020   Leukocytosis 06/13/2019   Malignant essential hypertension 10/29/2015   Upper GI bleed 11/11/2017   UTI (urinary tract infection) 01/24/2018   Past Surgical History:  Procedure Laterality Date   ABDOMINAL HYSTERECTOMY  1996   APPENDECTOMY  1997   ESOPHAGOGASTRODUODENOSCOPY N/A 11/12/2017   Procedure: ESOPHAGOGASTRODUODENOSCOPY (EGD);  Surgeon: Selena Daily, MD;  Location: [facility] ENDOSCOPY;  Service: Gastroenterology;  Laterality: N/A;   ESOPHAGOGASTRODUODENOSCOPY N/A 09/29/2018   Procedure: ESOPHAGOGASTRODUODENOSCOPY (EGD);  Surgeon: Toledo,  Alphonsus Jeans, MD;  Location: [facility] ENDOSCOPY;  Service: Gastroenterology;  Laterality: N/A;   ESOPHAGOGASTRODUODENOSCOPY (EGD) WITH PROPOFOL  N/A 04/24/2016   Procedure: ESOPHAGOGASTRODUODENOSCOPY (EGD) WITH PROPOFOL ;  Surgeon: Marnee Sink, MD;  Location: [facility] ENDOSCOPY;  Service: Endoscopy;  Laterality: N/A;   ESOPHAGOGASTRODUODENOSCOPY (EGD) WITH PROPOFOL  N/A 01/28/2019   Procedure: ESOPHAGOGASTRODUODENOSCOPY (EGD) WITH PROPOFOL ;  Surgeon: Toledo, Alphonsus Jeans, MD;  Location: [facility] ENDOSCOPY;  Service: Gastroenterology;  Laterality: N/A;   EYE SURGERY     HAND SURGERY  1988   IR CYSTOSTOMY TUBE PLACEMENT/BLADDER ASPIRATION  05/20/2023   THYROID  SURGERY  2013   Social History:  reports that she has quit smoking. Her smoking use included cigarettes. She has never used smokeless tobacco. She reports current alcohol use of about 1.0 standard drink of alcohol per week. She reports that she does not use drugs.  Allergies  Allergen Reactions   Metoclopramide  Other (See Comments) and Shortness Of Breath    Hypotension, shortness of breath  Hypotension, shortness of breath  Hypotension, shortness of breath  Hypotension, shortness of breath    Other Reaction(s): Other (See Comments)    Hypotension, shortness of breath  Hypotension, shortness of breath Hypotension, shortness of breath  Shortness of Breath, Hypotension   Penicillins Anaphylaxis, Rash and Swelling    Has patient had a PCN reaction causing immediate rash, facial/tongue/throat swelling, SOB or lightheadedness with hypotension: Yes  Has patient had a PCN reaction causing severe rash involving mucus membranes or skin necrosis: No  Has patient had  a PCN reaction that required hospitalization No  Has patient had a PCN reaction occurring within the last 10 years: No  If all of the above answers are "NO", then may proceed with Cephalosporin use.  Has patient had a PCN reaction causing immediate rash, facial/tongue/throat swelling, SOB or  lightheadedness with hypotension: Yes  Has patient had a PCN reaction causing severe rash involving mucus membranes or skin necrosis: No  Has patient had a PCN reaction that required hospitalization No  Has patient had a PCN reaction occurring within the last 10 years: No  If all of the above answers are "NO", then may proceed with Cephalosporin use.  Has patient had a PCN reaction causing immediate rash, facial/tongue/throat swelling, SOB or lightheadedness with hypotension: Yes  Has patient had a PCN reaction causing severe rash involving mucus membranes or skin necrosis: No  Has patient had a PCN reaction that required hospitalization No  Has patient had a PCN reaction occurring within the last 10 years: No  If all of the above answers are "NO", then may proceed with Cephalosporin use.  Has patient had a PCN reaction causing immediate rash, facial/tongue/throat swelling, SOB or lightheadedness with hypotension: Yes Has patient had a PCN reaction causing severe rash involving mucus membranes or skin necrosis: No Has patient had a PCN reaction that required hospitalization No Has patient had a PCN reaction occurring within the last 10 years: No If all of the above answers are "NO", then may proceed with Cephalosporin use.   Fentanyl  Rash    Rash   Other Rash    Bee sting   Bee sting  Bee sting    Bee sting   Bee sting   Oxycodone -Acetaminophen  Rash    plain tylenol  can also make itch?  plain tylenol  can also make itch?  plain tylenol  can also make itch?  plain tylenol  can also make itch?    plain tylenol  can also make itch?  plain tylenol  can also make itch? plain tylenol  can also make itch?   Percocet [Oxycodone -Acetaminophen ] Rash    plain tylenol  can also make itch?   Family History  Problem Relation Age of Onset   Diabetes Mother    Family history: Family history reviewed and not pertinent.  Prior to Admission medications   Medication Sig Start Date End Date Taking?  Authorizing Provider  amLODipine  (NORVASC ) 10 MG tablet Take 1 tablet (10 mg total) by mouth daily. for blood pressure 01/12/23   Trenda Frisk, FNP  ARIPiprazole  (ABILIFY ) 5 MG tablet Take 1 tablet (5 mg total) by mouth daily. 11/17/19   Pahwani, Regino Caprio, MD  Bempedoic Acid-Ezetimibe (NEXLIZET) 180-10 MG TABS TAKE 1 TABLET BY MOUTH AT BEDTIME FOR CHOLESTEROL 06/26/22   Trenda Frisk, FNP  cetirizine  (ZYRTEC ) 5 MG tablet Take 1 tablet (5 mg total) by mouth daily. 11/17/19 04/12/23  Pahwani, Regino Caprio, MD  cyclobenzaprine  (FLEXERIL ) 5 MG tablet TAKE 1 TABLET BY MOUTH UP TO TWICE A DAYAS NEEDED FOR NECK AND BACK PAIN 12/02/22   Trenda Frisk, FNP  diazepam  (VALIUM ) 2 MG tablet TAKE 1 TABLET BY MOUTH EVERY 12 HOURS ASNEEDED FOR MUSCLE SPASMS 05/04/23   Trenda Frisk, FNP  docusate sodium  (COLACE) 100 MG capsule TAKE 1 CAPSULE BY MOUTH TWICE DAILY 10/06/22   Trenda Frisk, FNP  empagliflozin (JARDIANCE) 25 MG TABS tablet Take 25 mg by mouth daily. 03/31/23 03/30/24  [provider]  famotidine  (PEPCID ) 20 MG tablet TAKE 1 TABLET BY MOUTH  TWICE DAILY 04/19/23   Shirley, Amanda M, FNP  hydrochlorothiazide  (HYDRODIURIL ) 12.5 MG tablet TAKE 1 TABLET BY MOUTH ONCE DAILY FOR SWELLING IN LEGS 02/16/22   Trenda Frisk, FNP  HYDROcodone -acetaminophen  (NORCO/VICODIN) 5-325 MG tablet TAKE 1 TABLET BY MOUTH 4 TIMES DAILY AS NEEDED FOR PAIN 05/06/23   Trenda Frisk, FNP  hydrOXYzine (ATARAX) 25 MG tablet TAKE 1 TABLET BY MOUTH TWICE DAILY AS NEEDED UNTIL RASH GOES AWAY 02/13/23   Trenda Frisk, FNP  insulin  aspart (NOVOLOG ) 100 UNIT/ML FlexPen Inject 2 Units into the skin 3 (three) times daily with meals. 11/17/19   Pahwani, Rinka R, MD  insulin  aspart protamine - aspart (NOVOLOG  MIX 70/30 FLEXPEN) (70-30) 100 UNIT/ML FlexPen INJECT 15 UNITS SUBCUTANEOUSLY ONCE EVERY MORNING AS DIRECTED *STOP OTHER INSULINS 03/26/23   Trenda Frisk, FNP  insulin  glargine (LANTUS  SOLOSTAR) 100 UNIT/ML  Solostar Pen INJECT 20 UNITS SUBCUTANEOUSLY AT BEDTIME 07/01/22   Trenda Frisk, FNP  insulin  glargine (LANTUS ) 100 UNIT/ML injection Inject 0.15 mLs (15 Units total) into the skin at bedtime. 05/24/20   Alphonsus Jeans, MD  Insulin  Pen Needle 29G X MISC 1 Dose by Does not apply route 3 (three) times daily before meals. 06/18/19   Verla Glaze, MD  nortriptyline (PAMELOR) 25 MG capsule TAKE 1 CAPSULE BY MOUTH AT BEDTIME 04/19/23   Trenda Frisk, FNP  omeprazole  (PRILOSEC) 40 MG capsule TAKE 1 CAPSULE BY MOUTH TWICE DAILY FOR REFULX 05/30/22   Trenda Frisk, FNP  ondansetron  (ZOFRAN  ODT) 4 MG disintegrating tablet Take 1 tablet (4 mg total) by mouth every 8 (eight) hours as needed for nausea or vomiting. 08/25/22   Trenda Frisk, FNP  Pancrelipase , Lip-Prot-Amyl, (ZENPEP ) 40000-126000 units CPEP TAKE 2 CAPSULES BY MOUTH WITH MEALS AND 1 WITH SNACKS 12/01/22   Trenda Frisk, FNP  Polyethylene Glycol 3350  POWD Take 17 g by mouth at bedtime. Mix 17 grams in 4-8 oz of liquid and drink     [provider]  polyethylene glycol powder (GLYCOLAX /MIRALAX ) 17 GM/SCOOP powder MIX 17 gms (1 CAPFUL) IN 8oz OF WATER OR JUICE AND DRINK ONCE DAILY. 04/19/23   Trenda Frisk, FNP  pregabalin  (LYRICA ) 200 MG capsule TAKE 1 CAPSULE BY MOUTH TWICE DAILY 07/08/22   Trenda Frisk, FNP  rosuvastatin  (CRESTOR ) 20 MG tablet TAKE 1 TABLET BY MOUTH ONCE EVERY EVENING 03/26/23   Trenda Frisk, FNP  tirzepatide  (MOUNJARO ) 5 MG/0.5ML Pen Inject 5 mg into the skin once a week. 01/12/23   Trenda Frisk, FNP  triamcinolone  cream (KENALOG ) 0.5 % Apply twice a day to back of thighs 06/18/19   Verla Glaze, MD  VASCEPA  0.5 g CAPS Take 4 capsules by mouth in the morning and at bedtime. 11/17/19   Pahwani, Regino Caprio, MD  zolpidem  (AMBIEN ) 10 MG tablet TAKE 1 TABLET BY MOUTH AT BEDTIME AS NEEDED FOR INSOMNIA 04/26/23   Trenda Frisk, FNP  GLUCERNA (GLUCERNA) LIQD Take 237 mLs by mouth 3  (three) times daily between meals. 02/19/15 02/27/15  Altha Athens, MD   Physical Exam: Vitals:   05/21/23 1530 05/21/23 1632 05/21/23 1708 05/21/23 1709  BP: (!) 150/74 (!) 144/81 (!) 154/71   Pulse: 84 89 85   Resp: 16 16 19    Temp:   98.2 F (36.8 C)   TempSrc:   Oral   SpO2: 100% 100% 100%   Weight:    45.6 kg  Height:  Constitutional: appears frail, cachectic Eyes: PERRL, lids and conjunctivae normal ENMT: Mucous membranes are moist. Posterior pharynx clear of any exudate or lesions. Age-appropriate dentition. Hearing appropriate Neck: normal, supple, no masses, no thyromegaly Respiratory: clear to auscultation bilaterally, no wheezing, no crackles. Normal respiratory effort. No accessory muscle use.  Cardiovascular: Regular rate and rhythm, no murmurs / rubs / gallops. No extremity edema. 2+ pedal pulses. No carotid bruits.  Abdomen: + Suprapubic tenderness, no masses palpated, no hepatosplenomegaly. Bowel sounds positive.  Musculoskeletal: no clubbing / cyanosis. No joint deformity upper and lower extremities. Good ROM, no contractures, no atrophy. Normal muscle tone.  Skin: no rashes, lesions, ulcers. No induration Neurologic: Sensation intact. Strength 5/5 in all 4.  Psychiatric: Normal judgment and insight. Alert and oriented x 3. Normal mood.   EKG: Not indicated at this time  Chest x-ray on Admission: Not indicated at this time  CT PELVIS WO CONTRAST Result Date: 05/21/2023 CLINICAL DATA:  Hematuria with dizziness and weakness EXAM: CT PELVIS WITHOUT CONTRAST TECHNIQUE: Multidetector CT imaging of the pelvis was performed following the standard protocol without intravenous contrast. RADIATION DOSE REDUCTION: This exam was performed according to the departmental dose-optimization program which includes automated exposure control, adjustment of the mA and/or kV according to patient size and/or use of iterative reconstruction technique. COMPARISON:  CT abdomen and pelvis  dated 04/28/2023 FINDINGS: Urinary Tract: Interval placement of a suprapubic catheter. Urinary bladder is distended and contains large volume irregular hyperdensity measuring 7.7 x 6.2 x 7.4 cm (2:29, 5:47). Small volume intraluminal gas. Bowel: Partially imaged bowel without bowel wall thickening, distention, or inflammatory changes. Colonic diverticulosis without acute diverticulitis. Appendix is not discretely seen. Vascular/Lymphatic: Aortic atherosclerosis. No enlarged abdominal or pelvic lymph nodes. Reproductive: No adnexal masses. Other: No free fluid, fluid collection, or free air. Musculoskeletal: No acute or abnormal lytic or blastic osseous lesions. 1.7 cm lipoma within the right iliopsoas (2:34). IMPRESSION: 1. Interval placement of a suprapubic catheter. Urinary bladder is distended and contains large volume irregular hyperdensity, likely hematoma. 2. Colonic diverticulosis without acute diverticulitis. 3.  Aortic Atherosclerosis (ICD10-I70.0). Electronically Signed   By: Limin  Xu M.D.   On: 05/21/2023 10:32   IR CYSTOSTOMY TUBE PLACEMENT/BLADDER ASPIRATION Result Date: 05/20/2023 INDICATION: 68 year old female with neurogenic bladder referred for suprapubic catheter placement EXAM: URINARY BLADDER ASPIRATION W/ NEEDLE COMPARISON:  04/28/2023 MEDICATIONS: None ANESTHESIA/SEDATION: 1.0 mg Versed , 1.0 mg Dilaudid  4 mg IV Zofran  15 mg IV Toradol  Moderate Sedation Time:  10 minutes The patient was continuously monitored during the procedure by the interventional radiology nurse under my direct supervision. CONTRAST:  10mL OMNIPAQUE  IOHEXOL  300 MG/ML SOLN - administered into the collecting system(s) FLUOROSCOPY TIME:  Fluoroscopy Time:  (2 mGy). COMPLICATIONS: None PROCEDURE: Informed written consent was obtained from the patient after a thorough discussion of the procedural risks, benefits and alternatives. All questions were addressed. Maximal Sterile Barrier Technique was utilized including caps,  mask, sterile gowns, sterile gloves, sterile drape, hand hygiene and skin antiseptic. A timeout was performed prior to the initiation of the procedure. Patient position in the supine position on the fluoroscopy table. The suprapubic region was prepped with chlorhexidine in a sterile fashion, and a sterile drape was applied covering the operative field. A sterile gown and sterile gloves were used for the procedure. Local anesthesia was provided with 1% Lidocaine . Indwelling Foley catheter had been clamped prior to the procedure. Small amount of additional saline was infused to distend the urinary bladder. Ultrasound guidance was used to identify  the urinary bladder in the midline just above the pubic bone. The skin and subcutaneous tissues were then generously infiltrated with 1% lidocaine  to the level of the anterior wall of the bladder for local anesthesia. A small stab incision was made in the skin, and an 18 gauge trocar was advanced under ultrasound guidance into the urinary bladder. 035 Amplatz wire was advanced into the urinary bladder through the trocar under fluoroscopy. A 10 mm x 80 mm athletis standard balloon catheter loaded coaxial through a 16 French Councill tip urinary catheter were then advanced on the Amplatz wire into the urinary bladder. Balloon was inflated to profile, and after relief of the high resistance waist of the soft tissues, the balloon was deflated and both the urinary catheter and balloon catheter were advanced on the Amplatz wire into the urinary bladder. The wire and the balloon were removed through the urinary catheter. 10 cc of saline used to inflate the retention balloon which was then withdrawn to the wall of the urinary bladder. Contrast was injected and final images were stored. Patient tolerated the procedure well and remained hemodynamically stable throughout. No complications were encountered and no significant blood loss was encountered. IMPRESSION: Status post image  guided placement of suprapubic catheter. Signed, Marciano Settles. Rexine Cater, RPVI [facility] Electronically Signed   By: Myrlene Asper D.O.   On: 05/20/2023 14:55   Labs on Admission: I have personally reviewed following labs  CBC: Recent Labs  Lab 05/20/23 1013 05/21/23 0815  WBC 7.8 17.3*  NEUTROABS 4.5 14.1*  HGB 12.5 10.7*  HCT 36.3 31.4*  MCV 87.9 89.2  PLT 287 257   Basic Metabolic Panel: Recent Labs  Lab 05/21/23 0815  NA 134*  K 4.5  CL 102  CO2 21*  GLUCOSE 228*  BUN 33*  CREATININE 2.54*  CALCIUM  8.7*   GFR: Estimated Creatinine Clearance: 15.7 mL/min (A) (by C-G formula based on SCr of 2.54 mg/dL (H)).  Coagulation Profile: Recent Labs  Lab 05/20/23 1013  INR 1.0   CBG: Recent Labs  Lab 05/20/23 1020  GLUCAP 203*   Urine analysis:    Component Value Date/Time   COLORURINE STRAW (A) 04/28/2023 1616   APPEARANCEUR HAZY (A) 04/28/2023 1616   APPEARANCEUR Hazy 08/11/2013 0729   LABSPEC 1.010 04/28/2023 1616   LABSPEC 1.030 08/11/2013 0729   PHURINE 5.0 04/28/2023 1616   GLUCOSEU >=500 (A) 04/28/2023 1616   GLUCOSEU >=500 08/11/2013 0729   HGBUR SMALL (A) 04/28/2023 1616   BILIRUBINUR NEGATIVE 04/28/2023 1616   BILIRUBINUR Negative 08/11/2013 0729   KETONESUR NEGATIVE 04/28/2023 1616   PROTEINUR >=300 (A) 04/28/2023 1616   NITRITE POSITIVE (A) 04/28/2023 1616   LEUKOCYTESUR NEGATIVE 04/28/2023 1616   LEUKOCYTESUR Negative 08/11/2013 0729   This document was prepared using Dragon Voice Recognition software and may include unintentional dictation errors.  Dr. Reinhold Carbine [facility]  If 7PM-7AM, please contact overnight-coverage provider If 7AM-7PM, please contact day attending provider [email or web address]  05/21/2023, 5:51 PM

## 2023-05-21 NOTE — Progress Notes (Signed)
 VIR  Contacted by the ED regarding 68 yo female, SP supra-pubic drainage catheter, POD 1, which is malfunctioning.   Indication is neurogenic bladder with poor baseline contractility.   CT performed shows intra-vesicular hemorrhage.  SP catheter in good location within the bladder.   Question of whether exchanging the catheter would be solution. I think too early to perform wire exchange, as < 24 hours after placement, with immature tract.  There is chance of losing access with such a new placement.  Perhaps irrigation can be considered from standard foley catheter, and if the SP drain remains blocked, can be exchanged after further maturity of tract and resolution of the clot.   Call with questions/concerns.   Signed,  Karen [name]. Karen Savage, DO, ABVM, RPVI

## 2023-05-21 NOTE — Assessment & Plan Note (Signed)
 Status post three-way Foley catheter placement via urethra with CBI Strict I's and O's Sodium chloride  75 mL/h, 1 day ordered

## 2023-05-21 NOTE — Assessment & Plan Note (Addendum)
 Suspect secondary to obstructive uropathy Strict I's and O's Sodium chloride  infusion at 75 mL/h, 1 day ordered Check a UA tomorrow.  I did not order it today due to hematuria and this would cause color interference

## 2023-05-21 NOTE — ED Notes (Signed)
 Bladder scan was . This RN attempted to irrigate catheter upon MD's request. instilled through irrigation. No output noted. Pt reported discomfort. MD notified.

## 2023-05-21 NOTE — ED Notes (Signed)
 Pt ambulated to restroom and passed a small amount of urine through her urethra. Gross hematuria

## 2023-05-21 NOTE — Hospital Course (Addendum)
 Ms. Karen [name] is a 68 year old female with history of insulin -dependent diabetes mellitus, hypertension, hyperlipidemia, insomnia, neurogenic bladder secondary to poorly controlled diabetes mellitus, who presents emergency department for chief concerns of hematuria.  Vitals in the ED showed T max of 98.6, rr of 18, hr 90, pressure 125/68, SpO2 99% on room air.  Serum sodium is 134, potassium 4.5, chloride 102, bicarb 21, BUN of 33, serum creatinine 2.54,EGFR of 20, WBC 17.3, hemoglobin 10.7, platelets of 257.  ED treatment: Morphine  4 mg IV 2 doses, sodium chloride  3 L bolus.  EDP discussed with IR who recommends against exchanging catheter at this time due to it being too early and immature tract.  High risk of losing access with new placement.

## 2023-05-21 NOTE — Assessment & Plan Note (Signed)
 Home amlodipine  10 mg daily resumed Home hydrochlorothiazide  not resumed on admission in setting of AKI

## 2023-05-21 NOTE — ED Triage Notes (Signed)
 Pt to the ED via [facility] from home for hematuria. Pt had a suprapubic catheter placed yesterday. Pt reports urinating through her urethra yesterday and it being lots of blood.   Pt reports dizziness and weakness when standing up.   Temp 98.6 BP 124/72 HR 102 SPO2 98% RA CBG 225 Hx of diabetes, pt has not been taking her insulin  due to decreased oral intake.

## 2023-05-21 NOTE — ED Provider Notes (Signed)
 [facility] Provider Note    Event Date/Time   First MD Initiated Contact with Patient 05/21/23 [phone]     (approximate)   History   Chief Complaint Hematuria   HPI  Karen [name] is a 68 y.o. female with past medical history of hypertension, hyperlipidemia, diabetes, CKD, and neurogenic bladder who presents to the ED complaining of hematuria.  Patient reports that she had a new suprapubic catheter placed at the hospital yesterday, went home feeling well.  She then woke up this morning with some pain around her surgical site, attempted to urinate and reports putting out a significant amount of blood into her catheter bag.  She also reports some bleeding around her dressing site and when she attempted to continue to urinate, ended up leaking urine from her ureter.  She reports increasing pain since then in her suprapubic area.     Physical Exam   Triage Vital Signs: ED Triage Vitals [05/21/23 0813]  Encounter Vitals Group     BP (!) 128/57     Systolic BP Percentile      Diastolic BP Percentile      Pulse Rate 97     Resp 20     Temp 98.6 F (37 C)     Temp Source Oral     SpO2 100 %     Weight      Height      Head Circumference      Peak Flow      Pain Score 9     Pain Loc      Pain Education      Exclude from Growth Chart     Most recent vital signs: Vitals:   05/21/23 1230 05/21/23 1300  BP: 129/67 133/65  Pulse: 91 91  Resp: 18 18  Temp:    SpO2: 98% 99%    Constitutional: Alert and oriented. Eyes: Conjunctivae are normal. Head: Atraumatic. Nose: No congestion/rhinnorhea. Mouth/Throat: Mucous membranes are moist.  Cardiovascular: Normal rate, regular rhythm. Grossly normal heart sounds.  2+ radial pulses bilaterally. Respiratory: Normal respiratory effort.  No retractions. Lungs CTAB. Gastrointestinal: Soft and nontender. No distention. Genitourinary: Suprapubic catheter site with some clotted blood, no active bleeding noted and  catheter is intact.  Dark red urine noted in Foley collecting bag.  Mild suprapubic tenderness to palpation noted. Musculoskeletal: No lower extremity tenderness nor edema.  Neurologic:  Normal speech and language. No gross focal neurologic deficits are appreciated.    ED Results / Procedures / Treatments   Labs (all labs ordered are listed, but only abnormal results are displayed) Labs Reviewed  CBC WITH DIFFERENTIAL/PLATELET - Abnormal; Notable for the following components:      Result Value   WBC 17.3 (*)    RBC 3.52 (*)    Hemoglobin 10.7 (*)    HCT 31.4 (*)    Neutro Abs 14.1 (*)    Monocytes Absolute 1.2 (*)    Abs Immature Granulocytes 0.13 (*)    All other components within normal limits  BASIC METABOLIC PANEL WITH GFR - Abnormal; Notable for the following components:   Sodium 134 (*)    CO2 21 (*)    Glucose, Bld 228 (*)    BUN 33 (*)    Creatinine, Ser 2.54 (*)    Calcium  8.7 (*)    GFR, Estimated 20 (*)    All other components within normal limits     RADIOLOGY CT pelvis reviewed and interpreted by  me with appropriate positioning of suprapubic catheter, blood products noted in the bladder.  PROCEDURES:  Critical Care performed: No  Procedures   MEDICATIONS ORDERED IN ED: Medications  sodium chloride  irrigation 0.9 % 3,000 mL (has no administration in time range)  morphine  (PF) 4 MG/ML injection 4 mg (4 mg Intravenous Given 05/21/23 0823)  morphine  (PF) 4 MG/ML injection 4 mg (4 mg Intravenous Given 05/21/23 1251)     IMPRESSION / MDM / ASSESSMENT AND PLAN / ED COURSE  I reviewed the triage vital signs and the nursing notes.                              68 y.o. female with past medical history of hypertension, hyperlipidemia, diabetes, CKD, and neurogenic bladder status post suprapubic catheter placement yesterday who presents to the ED with bleeding from her suprapubic catheter site as well as into the collecting bag, reports leaking urine from her  urethra.  Patient's presentation is most consistent with acute presentation with potential threat to life or bodily function.  Differential diagnosis includes, but is not limited to, displaced suprapubic catheter, obstructed catheter, UTI.  Patient uncomfortable but nontoxic-appearing and in no acute distress, vital signs are unremarkable.  Suprapubic catheter appears appropriately positioned on external examination, she has had some bleeding but this is now clotted and no evidence of ongoing bleeding.  She does have significant amount of blood in the collecting bag and I would be concerned for obstruction given increasing suprapubic pain.  Bladder scan shows 283 mL in the bladder, will attempt to flush catheter to support ongoing flow.  Labs with leukocytosis, likely due to recent surgery and low suspicion for infection at this time.  No significant anemia noted, patient does have mild AKI and we will treat with IV fluids.  Flushing of the catheter was unsuccessful, case discussed with Dr. Jarvis Mesa of urology, who recommends CT imaging to ensure appropriate positioning and follow-up with interventional radiology.  CT imaging shows appropriate positioning of catheter but with blood products in the bladder, likely representing hematoma.  Findings discussed with Dr. Mabel Savage of IR, who is hesitant to replace suprapubic catheter and recommends three-way Foley placement via urethra.  Dr. Jarvis Mesa in agreement with plan, will initiate CBI and case discussed with hospitalist for admission.      FINAL CLINICAL IMPRESSION(S) / ED DIAGNOSES   Final diagnoses:  Urinary retention  Gross hematuria  Obstruction of suprapubic catheter, initial encounter (HCC)     Rx / DC Orders   ED Discharge Orders     None        Note:  This document was prepared using Dragon voice recognition software and may include unintentional dictation errors.   Twilla Galea, MD 05/21/23 [phone]

## 2023-05-21 NOTE — ED Notes (Signed)
 Pt reporting a HA

## 2023-05-21 NOTE — ED Notes (Signed)
 Repeat bladder scan . This RN attempted to flush catheter again. Flushed with . No return output. Pt reported discomfort.

## 2023-05-21 NOTE — ED Notes (Signed)
 Called CCMD for central monitoring at this time

## 2023-05-21 NOTE — Assessment & Plan Note (Addendum)
 PDMP reviewed Home Valium  2 mg every 12 hours as needed for muscle spasms, resumed as I do not want patient to withdrawal Zolpidem  10 mg nightly changed to 5 mg nightly for insomnia

## 2023-05-22 DIAGNOSIS — R310 Gross hematuria: Secondary | ICD-10-CM | POA: Diagnosis not present

## 2023-05-22 DIAGNOSIS — N179 Acute kidney failure, unspecified: Secondary | ICD-10-CM

## 2023-05-22 LAB — URINALYSIS, COMPLETE (UACMP) WITH MICROSCOPIC
Bilirubin Urine: NEGATIVE
Glucose, UA: NEGATIVE mg/dL
Ketones, ur: NEGATIVE mg/dL
Leukocytes,Ua: NEGATIVE
Nitrite: NEGATIVE
Protein, ur: 30 mg/dL — AB
RBC / HPF: >50 RBC/hpf (ref 0–5)
Specific Gravity, Urine: 1.004 — ABNORMAL LOW (ref 1.005–1.030)
pH: 5 (ref 5.0–8.0)

## 2023-05-22 LAB — CBC
HCT: 26.6 % — ABNORMAL LOW (ref 36.0–46.0)
Hemoglobin: 8.8 g/dL — ABNORMAL LOW (ref 12.0–15.0)
MCH: 30.8 pg (ref 26.0–34.0)
MCHC: 33.1 g/dL (ref 30.0–36.0)
MCV: 93 fL (ref 80.0–100.0)
Platelets: 192 10*3/uL (ref 150–400)
RBC: 2.86 MIL/uL — ABNORMAL LOW (ref 3.87–5.11)
RDW: 12.2 % (ref 11.5–15.5)
WBC: 8.3 10*3/uL (ref 4.0–10.5)
nRBC: 0 % (ref 0.0–0.2)

## 2023-05-22 LAB — BASIC METABOLIC PANEL WITH GFR
Anion gap: 12 (ref 5–15)
BUN: 37 mg/dL — ABNORMAL HIGH (ref 8–23)
CO2: 20 mmol/L — ABNORMAL LOW (ref 22–32)
Calcium: 8.3 mg/dL — ABNORMAL LOW (ref 8.9–10.3)
Chloride: 102 mmol/L (ref 98–111)
Creatinine, Ser: 2.52 mg/dL — ABNORMAL HIGH (ref 0.44–1.00)
GFR, Estimated: 20 mL/min — ABNORMAL LOW (ref >60)
Glucose, Bld: 184 mg/dL — ABNORMAL HIGH (ref 70–99)
Potassium: 4.4 mmol/L (ref 3.5–5.1)
Sodium: 134 mmol/L — ABNORMAL LOW (ref 135–145)

## 2023-05-22 LAB — GLUCOSE, CAPILLARY
Glucose-Capillary: 155 mg/dL — ABNORMAL HIGH (ref 70–99)
Glucose-Capillary: 125 mg/dL — ABNORMAL HIGH (ref 70–99)
Glucose-Capillary: 134 mg/dL — ABNORMAL HIGH (ref 70–99)
Glucose-Capillary: 134 mg/dL — ABNORMAL HIGH (ref 70–99)

## 2023-05-22 LAB — HEMOGLOBIN AND HEMATOCRIT, BLOOD
HCT: 26.4 % — ABNORMAL LOW (ref 36.0–46.0)
Hemoglobin: 8.9 g/dL — ABNORMAL LOW (ref 12.0–15.0)

## 2023-05-22 MED ORDER — CHLORHEXIDINE GLUCONATE CLOTH 2 % EX PADS
6.0000 | MEDICATED_PAD | Freq: Every day | CUTANEOUS | Status: DC
  Administered 2023-05-22 – 2023-05-27 (×6): 6 via TOPICAL

## 2023-05-22 NOTE — Consult Note (Signed)
 Urology Consult    Reason for consult: hematuria, clot retention  History of Present Illness: Karen [name] is a 68 y.o. F who presented to the ED yesterday c/o urinary retention and hematuria. She had a SP tube placed for management of neurogenic bladder 2 days ago. She began to have hematuria afterwards and noticed her SP tube was not draining. The tube could not be flushed in the ED, and a 3 way foley was placed. CT scan shows SP tube in the bladder with some likely blood clots present.   Today Karen [name] is doing OK. Her foley is draining light pink on low/med CBI. I hand irrigated out a small amount of clots  Past Medical History:  Diagnosis Date   Abdominal pain    Acute cystitis 05/18/2020   Acute kidney injury superimposed on CKD (HCC)    Acute on chronic pancreatitis (HCC) 01/03/2017   Acute renal insufficiency 10/29/2015   Adhesive capsulitis of left shoulder    Alcoholic pancreatitis    Asthma    Depression    Diabetes mellitus without complication (HCC)    Diabetic cataract of right eye (HCC) 08/31/2019   Formatting of this note might be different from the original. Added automatically from request for surgery [id number]   DKA (diabetic ketoacidoses) 02/17/2015   DKA (diabetic ketoacidosis) (HCC) 02/17/2015   IMO SNOMED Dx Update Oct 2024     Elevated alkaline phosphatase level 10/30/2021   Emphysematous cystitis    Esophageal candidiasis (HCC)    ETOH abuse 01/03/2017   Headache 02/26/2019   Hypercholesteremia    Hypertension    Hypertensive urgency 04/21/2016   Hypokalemia    Left flank pain 10/29/2015   Left upper quadrant abdominal pain 05/18/2020   Leukocytosis 06/13/2019   Malignant essential hypertension 10/29/2015   Upper GI bleed 11/11/2017   UTI (urinary tract infection) 01/24/2018    Past Surgical History:  Procedure Laterality Date   ABDOMINAL HYSTERECTOMY  1996   APPENDECTOMY  1997   ESOPHAGOGASTRODUODENOSCOPY N/A 11/12/2017   Procedure:  ESOPHAGOGASTRODUODENOSCOPY (EGD);  Surgeon: Selena Daily, MD;  Location: [facility] ENDOSCOPY;  Service: Gastroenterology;  Laterality: N/A;   ESOPHAGOGASTRODUODENOSCOPY N/A 09/29/2018   Procedure: ESOPHAGOGASTRODUODENOSCOPY (EGD);  Surgeon: Toledo, Alphonsus Jeans, MD;  Location: [facility] ENDOSCOPY;  Service: Gastroenterology;  Laterality: N/A;   ESOPHAGOGASTRODUODENOSCOPY (EGD) WITH PROPOFOL  N/A 04/24/2016   Procedure: ESOPHAGOGASTRODUODENOSCOPY (EGD) WITH PROPOFOL ;  Surgeon: Marnee Sink, MD;  Location: [facility] ENDOSCOPY;  Service: Endoscopy;  Laterality: N/A;   ESOPHAGOGASTRODUODENOSCOPY (EGD) WITH PROPOFOL  N/A 01/28/2019   Procedure: ESOPHAGOGASTRODUODENOSCOPY (EGD) WITH PROPOFOL ;  Surgeon: Toledo, Alphonsus Jeans, MD;  Location: [facility] ENDOSCOPY;  Service: Gastroenterology;  Laterality: N/A;   EYE SURGERY     HAND SURGERY  1988   IR CYSTOSTOMY TUBE PLACEMENT/BLADDER ASPIRATION  05/20/2023   THYROID  SURGERY  2013     Current Hospital Medications:  Home meds:  No current facility-administered medications on file prior to encounter.   Current Outpatient Medications on File Prior to Encounter  Medication Sig Dispense Refill   amLODipine  (NORVASC ) 10 MG tablet Take 1 tablet (10 mg total) by mouth daily. for blood pressure 90 tablet 1   ARIPiprazole  (ABILIFY ) 5 MG tablet Take 1 tablet (5 mg total) by mouth daily. 30 tablet 0   Bempedoic Acid-Ezetimibe (NEXLIZET) 180-10 MG TABS TAKE 1 TABLET BY MOUTH AT BEDTIME FOR CHOLESTEROL 90 tablet 3   cetirizine  (ZYRTEC ) 5 MG tablet Take 1 tablet (5 mg total) by mouth daily. 30 tablet 0  cyclobenzaprine  (FLEXERIL ) 5 MG tablet TAKE 1 TABLET BY MOUTH UP TO TWICE A DAYAS NEEDED FOR NECK AND BACK PAIN 180 tablet 1   diazepam  (VALIUM ) 2 MG tablet TAKE 1 TABLET BY MOUTH EVERY 12 HOURS ASNEEDED FOR MUSCLE SPASMS 30 tablet 2   docusate sodium  (COLACE) 100 MG capsule TAKE 1 CAPSULE BY MOUTH TWICE DAILY 180 capsule 0   empagliflozin (JARDIANCE) 25 MG TABS tablet Take 25 mg by mouth  daily.     famotidine  (PEPCID ) 20 MG tablet TAKE 1 TABLET BY MOUTH TWICE DAILY 60 tablet 0   hydrochlorothiazide  (HYDRODIURIL ) 12.5 MG tablet TAKE 1 TABLET BY MOUTH ONCE DAILY FOR SWELLING IN LEGS 90 tablet 1   HYDROcodone -acetaminophen  (NORCO/VICODIN) 5-325 MG tablet TAKE 1 TABLET BY MOUTH 4 TIMES DAILY AS NEEDED FOR PAIN 120 tablet 0   hydrOXYzine  (ATARAX ) 25 MG tablet TAKE 1 TABLET BY MOUTH TWICE DAILY AS NEEDED UNTIL RASH GOES AWAY 60 tablet 2   insulin  aspart protamine - aspart (NOVOLOG  MIX 70/30 FLEXPEN) (70-30) 100 UNIT/ML FlexPen INJECT 15 UNITS SUBCUTANEOUSLY ONCE EVERY MORNING AS DIRECTED *STOP OTHER INSULINS 6 mL 3   nortriptyline  (PAMELOR ) 25 MG capsule TAKE 1 CAPSULE BY MOUTH AT BEDTIME 90 capsule 1   omeprazole  (PRILOSEC) 40 MG capsule TAKE 1 CAPSULE BY MOUTH TWICE DAILY FOR REFULX 180 capsule 3   Pancrelipase , Lip-Prot-Amyl, (ZENPEP ) 40000-126000 units CPEP TAKE 2 CAPSULES BY MOUTH WITH MEALS AND 1 WITH SNACKS 240 capsule 3   Polyethylene Glycol 3350  POWD Take 17 g by mouth at bedtime. Mix 17 grams in 4-8 oz of liquid and drink      polyethylene glycol powder (GLYCOLAX /MIRALAX ) 17 GM/SCOOP powder MIX 17 gms (1 CAPFUL) IN 8oz OF WATER OR JUICE AND DRINK ONCE DAILY. 1530 g 2   rosuvastatin  (CRESTOR ) 20 MG tablet TAKE 1 TABLET BY MOUTH ONCE EVERY EVENING 90 tablet 3   tirzepatide  (MOUNJARO ) 5 MG/0.5ML Pen Inject 5 mg into the skin once a week. 2 mL 3   VASCEPA  0.5 g CAPS Take 4 capsules by mouth in the morning and at bedtime. 60 capsule 0   zolpidem  (AMBIEN ) 10 MG tablet TAKE 1 TABLET BY MOUTH AT BEDTIME AS NEEDED FOR INSOMNIA 30 tablet 4   Insulin  Pen Needle 29G X MISC 1 Dose by Does not apply route 3 (three) times daily before meals. 100 each 0   ondansetron  (ZOFRAN  ODT) 4 MG disintegrating tablet Take 1 tablet (4 mg total) by mouth every 8 (eight) hours as needed for nausea or vomiting. (Patient not taking: Reported on 05/21/2023) 90 tablet 1   pregabalin  (LYRICA ) 200 MG capsule  TAKE 1 CAPSULE BY MOUTH TWICE DAILY (Patient not taking: Reported on 05/21/2023) 180 capsule 1   triamcinolone  cream (KENALOG ) 0.5 % Apply twice a day to back of thighs (Patient not taking: Reported on 05/21/2023) 30 g 0   [DISCONTINUED] GLUCERNA (GLUCERNA) LIQD Take 237 mLs by mouth 3 (three) times daily between meals. 30 Can 0     Scheduled Meds:  amLODipine   10 mg Oral Daily   ARIPiprazole   5 mg Oral Daily   Chlorhexidine Gluconate Cloth  6 each Topical Daily   famotidine   20 mg Oral QHS   feeding supplement  237 mL Oral BID BM   icosapent  Ethyl  2 g Oral BID   insulin  aspart  0-5 Units Subcutaneous QHS   insulin  aspart  0-9 Units Subcutaneous TID WC   lipase/protease/amylase  72,000 Units Oral TID WC  nortriptyline   25 mg Oral QHS   pantoprazole   80 mg Oral Daily   polyethylene glycol  17 g Oral QHS   Continuous Infusions:  sodium chloride  75 mL/hr at 05/22/23 1100   sodium chloride  irrigation     PRN Meds:.acetaminophen  **OR** acetaminophen , cyclobenzaprine , diazepam , HYDROcodone -acetaminophen , hydrOXYzine , morphine  injection, ondansetron  **OR** ondansetron  (ZOFRAN ) IV, mouth rinse, senna-docusate, zolpidem   Allergies:  Allergies  Allergen Reactions   Metoclopramide  Other (See Comments) and Shortness Of Breath    Hypotension, shortness of breath  Hypotension, shortness of breath  Hypotension, shortness of breath  Hypotension, shortness of breath    Other Reaction(s): Other (See Comments)    Hypotension, shortness of breath  Hypotension, shortness of breath Hypotension, shortness of breath  Shortness of Breath, Hypotension   Penicillins Anaphylaxis, Rash and Swelling    Has patient had a PCN reaction causing immediate rash, facial/tongue/throat swelling, SOB or lightheadedness with hypotension: Yes  Has patient had a PCN reaction causing severe rash involving mucus membranes or skin necrosis: No  Has patient had a PCN reaction that required hospitalization No  Has  patient had a PCN reaction occurring within the last 10 years: No  If all of the above answers are "NO", then may proceed with Cephalosporin use.  Has patient had a PCN reaction causing immediate rash, facial/tongue/throat swelling, SOB or lightheadedness with hypotension: Yes  Has patient had a PCN reaction causing severe rash involving mucus membranes or skin necrosis: No  Has patient had a PCN reaction that required hospitalization No  Has patient had a PCN reaction occurring within the last 10 years: No  If all of the above answers are "NO", then may proceed with Cephalosporin use.  Has patient had a PCN reaction causing immediate rash, facial/tongue/throat swelling, SOB or lightheadedness with hypotension: Yes  Has patient had a PCN reaction causing severe rash involving mucus membranes or skin necrosis: No  Has patient had a PCN reaction that required hospitalization No  Has patient had a PCN reaction occurring within the last 10 years: No  If all of the above answers are "NO", then may proceed with Cephalosporin use.  Has patient had a PCN reaction causing immediate rash, facial/tongue/throat swelling, SOB or lightheadedness with hypotension: Yes Has patient had a PCN reaction causing severe rash involving mucus membranes or skin necrosis: No Has patient had a PCN reaction that required hospitalization No Has patient had a PCN reaction occurring within the last 10 years: No If all of the above answers are "NO", then may proceed with Cephalosporin use.   Fentanyl  Rash    Rash   Other Rash    Bee sting   Bee sting  Bee sting    Bee sting   Bee sting   Oxycodone -Acetaminophen  Rash    plain tylenol  can also make itch?  plain tylenol  can also make itch?  plain tylenol  can also make itch?  plain tylenol  can also make itch?    plain tylenol  can also make itch?  plain tylenol  can also make itch? plain tylenol  can also make itch?   Percocet [Oxycodone -Acetaminophen ] Rash    plain tylenol   can also make itch?    Family History  Problem Relation Age of Onset   Diabetes Mother     Social History:  reports that she has quit smoking. Her smoking use included cigarettes. She has never used smokeless tobacco. She reports current alcohol use of about 1.0 standard drink of alcohol per week. She reports that she does not  use drugs.  ROS: A complete review of systems was performed.  All systems are negative except for pertinent findings as noted.  Physical Exam:  Vital signs in last 24 hours: Temp:  [98.1 F (36.7 C)-98.7 F (37.1 C)] 98.2 F (36.8 C) (05/17 0734) Pulse Rate:  [78-91] 81 (05/17 0734) Resp:  [16-19] 16 (05/17 0734) BP: (129-154)/(65-81) 144/65 (05/17 0734) SpO2:  [99 %-100 %] 100 % (05/17 0734) Weight:  [45.6 kg] 45.6 kg (05/16 1709) Constitutional:  Alert and oriented, No acute distress Cardiovascular: Regular rate and rhythm Respiratory: Normal respiratory effort, Lungs clear bilaterally GI: Abdomen is soft, nontender, nondistended, no abdominal masses Neurologic: Grossly intact, no focal deficits Psychiatric: Normal mood and affect  Laboratory Data:  Recent Labs    05/20/23 1013 05/21/23 0815 05/22/23 0525  WBC 7.8 17.3* 8.3  HGB 12.5 10.7* 8.8*  HCT 36.3 31.4* 26.6*  PLT 287 257 192    Recent Labs    05/21/23 0815 05/22/23 0525  NA 134* 134*  K 4.5 4.4  CL 102 102  GLUCOSE 228* 184*  BUN 33* 37*  CALCIUM  8.7* 8.3*  CREATININE 2.54* 2.52*     Results for orders placed or performed during the hospital encounter of 05/21/23 (from the past 24 hours)  Glucose, capillary     Status: Abnormal   Collection Time: 05/21/23  5:54 PM  Result Value Ref Range   Glucose-Capillary 220 (H) 70 - 99 mg/dL  Urinalysis, Complete w Microscopic -Urine, Clean Catch     Status: Abnormal   Collection Time: 05/21/23  6:00 PM  Result Value Ref Range   Color, Urine STRAW (A) YELLOW   APPearance HAZY (A) CLEAR   Specific Gravity, Urine 1.004 (L) 1.005 -  1.030   pH 5.0 5.0 - 8.0   Glucose, UA NEGATIVE NEGATIVE mg/dL   Hgb urine dipstick LARGE (A) NEGATIVE   Bilirubin Urine NEGATIVE NEGATIVE   Ketones, ur NEGATIVE NEGATIVE mg/dL   Protein, ur 30 (A) NEGATIVE mg/dL   Nitrite NEGATIVE NEGATIVE   Leukocytes,Ua NEGATIVE NEGATIVE   RBC / HPF >50 0 - 5 RBC/hpf   WBC, UA 11-20 0 - 5 WBC/hpf   Bacteria, UA RARE (A) NONE SEEN   Squamous Epithelial / HPF 0-5 0 - 5 /HPF  Glucose, capillary     Status: Abnormal   Collection Time: 05/21/23  9:04 PM  Result Value Ref Range   Glucose-Capillary 190 (H) 70 - 99 mg/dL  Basic metabolic panel     Status: Abnormal   Collection Time: 05/22/23  5:25 AM  Result Value Ref Range   Sodium 134 (L) 135 - 145 mmol/L   Potassium 4.4 3.5 - 5.1 mmol/L   Chloride 102 98 - 111 mmol/L   CO2 20 (L) 22 - 32 mmol/L   Glucose, Bld 184 (H) 70 - 99 mg/dL   BUN 37 (H) 8 - 23 mg/dL   Creatinine, Ser 8.46 (H) 0.44 - 1.00 mg/dL   Calcium  8.3 (L) 8.9 - 10.3 mg/dL   GFR, Estimated 20 (L) >60 mL/min   Anion gap 12 5 - 15  CBC     Status: Abnormal   Collection Time: 05/22/23  5:25 AM  Result Value Ref Range   WBC 8.3 4.0 - 10.5 K/uL   RBC 2.86 (L) 3.87 - 5.11 MIL/uL   Hemoglobin 8.8 (L) 12.0 - 15.0 g/dL   HCT 96.2 (L) 95.2 - 84.1 %   MCV 93.0 80.0 - 100.0 fL   MCH  30.8 26.0 - 34.0 pg   MCHC 33.1 30.0 - 36.0 g/dL   RDW 04.5 40.9 - 81.1 %   Platelets 192 150 - 400 K/uL   nRBC 0.0 0.0 - 0.2 %  Glucose, capillary     Status: Abnormal   Collection Time: 05/22/23  7:35 AM  Result Value Ref Range   Glucose-Capillary 155 (H) 70 - 99 mg/dL  Glucose, capillary     Status: Abnormal   Collection Time: 05/22/23 11:32 AM  Result Value Ref Range   Glucose-Capillary 125 (H) 70 - 99 mg/dL   No results found for this or any previous visit (from the past 240 hours).  Renal Function: Recent Labs    05/21/23 0815 05/22/23 0525  CREATININE 2.54* 2.52*   Estimated Creatinine Clearance: 15.8 mL/min (A) (by C-G formula based on  SCr of 2.52 mg/dL (H)).  Radiologic Imaging: CT PELVIS WO CONTRAST Result Date: 05/21/2023 CLINICAL DATA:  Hematuria with dizziness and weakness EXAM: CT PELVIS WITHOUT CONTRAST TECHNIQUE: Multidetector CT imaging of the pelvis was performed following the standard protocol without intravenous contrast. RADIATION DOSE REDUCTION: This exam was performed according to the departmental dose-optimization program which includes automated exposure control, adjustment of the mA and/or kV according to patient size and/or use of iterative reconstruction technique. COMPARISON:  CT abdomen and pelvis dated 04/28/2023 FINDINGS: Urinary Tract: Interval placement of a suprapubic catheter. Urinary bladder is distended and contains large volume irregular hyperdensity measuring 7.7 x 6.2 x 7.4 cm (2:29, 5:47). Small volume intraluminal gas. Bowel: Partially imaged bowel without bowel wall thickening, distention, or inflammatory changes. Colonic diverticulosis without acute diverticulitis. Appendix is not discretely seen. Vascular/Lymphatic: Aortic atherosclerosis. No enlarged abdominal or pelvic lymph nodes. Reproductive: No adnexal masses. Other: No free fluid, fluid collection, or free air. Musculoskeletal: No acute or abnormal lytic or blastic osseous lesions. 1.7 cm lipoma within the right iliopsoas (2:34). IMPRESSION: 1. Interval placement of a suprapubic catheter. Urinary bladder is distended and contains large volume irregular hyperdensity, likely hematoma. 2. Colonic diverticulosis without acute diverticulitis. 3.  Aortic Atherosclerosis (ICD10-I70.0). Electronically Signed   By: Limin  Xu M.D.   On: 05/21/2023 10:32    I independently reviewed the above imaging studies.  Impression/Recommendation: 68 yo F with hx of neurogenic bladder, hematuria/clot retention after SP tube placement 5/15, now with SP tube and 3 way foley in place on CBI. Encouraged by the appearance of her urine overall today.  --continue CBI, may  titrate to light pink --will reassess tomorrow. Briefly discussed with patient that if conservative measures fail could require OR  Julene Oaks 05/22/2023, 12:43 PM  [facility]  Pager: [phone]

## 2023-05-22 NOTE — Progress Notes (Signed)
  Progress Note   Patient: Karen [name]:[id number] DOB: 1955/09/06 DOA: 05/21/2023     0 DOS: the patient was seen and examined on 05/22/2023   Brief hospital course:  "Ms. Jimesha Lucero is a 68 year old female with history of insulin -dependent diabetes mellitus, hypertension, hyperlipidemia, insomnia, neurogenic bladder secondary to poorly controlled diabetes mellitus, who presents emergency department for chief concerns of hematuria. " .Aaron AasAaron AasSee H&P for full HPI on admission & ED course.  Of note, patient had undergone placement of a suprapubic catheter on 05/20/23, the day before presenting.  ED provider discussed case with IR.  It was not recommended to attempt replacing the catheter due to immature tract and risk of losing access.  Bladder irrigation from standard Foley was recommended.  Patient was admitted for further evaluation and management as outlined in detail below.   Assessment and Plan:  * Hematuria Status post three-way Foley catheter placement via urethra with CBI --Urology consulted --Continue CBI, titrate to light pink output --Monitor urine output  Anxiety, Insomnia At risk for polypharmacy  --Continue home Valium , Ambien  --Monitor closely  Chronic pancreatitis due to chronic alcoholism (HCC) --Pancrelipase    AKI (acute kidney injury) - Suspect secondary to obstructive uropathy --Strict I's and O's --IV fluids --Monitor BMP --Renally dose meds & avoid nephrotoxins  Type 2 diabetes mellitus with hyperlipidemia (HCC) --Hold home regimen --Sliding scale Novolog   Hypertension --Continue amlodipine   --Holding hydrochlorothiazide         Subjective: Pt seen with husband at bedside this AM. She reports suprapubic pain/discomfort and ongoing bleeding.  No other acute complaints.  She agrees to blood transfusion if it is needed.   Physical Exam: Vitals:   05/21/23 1709 05/21/23 1935 05/22/23 0312 05/22/23 0734  BP:  129/66 (!) 150/76 (!) 144/65  Pulse:  80 78  81  Resp:  16 16 16   Temp:  98.5 F (36.9 C) 98.1 F (36.7 C) 98.2 F (36.8 C)  TempSrc:      SpO2:  99% 100% 100%  Weight: 45.6 kg     Height:       General exam: awake, alert, no acute distress HEENT: moist mucus membranes, hearing grossly normal  Respiratory system: CTAB, no wheezes, rales or rhonchi, normal respiratory effort. Cardiovascular system: normal S1/S2, RRR, no JVD, murmurs, rubs, gallops, no pedal edema.   Gastrointestinal system: soft, NT, ND, no HSM felt, +bowel sounds. Genitourinary system: Foley in place with CBI, gross hematuria noted light red Central nervous system: A&O x 3. no gross focal neurologic deficits, normal speech Extremities: moves all , no edema, normal tone Skin: dry, intact, normal temperature Psychiatry: normal mood, congruent affect, judgement and insight appear normal   Data Reviewed:  Notable labs --  Hbg trend 12.5 >> 10.7 >> 8.8  Na 134 Bicarb 20 Glucose 184 Cr 2.54 >> 2.52  Ca 8.3  Family Communication: husband at bedside on rounds  Disposition: Status is: Observation Remains admitted with ongoing hematuria and nearly 4 point drop in hemoglobin that requires close monitoring and possible transfusion.    Planned Discharge Destination: Home    Time spent: 45 minutes  Author: Montey Apa, DO 05/22/2023 12:49 PM  For on call review [email or web address].

## 2023-05-23 ENCOUNTER — Inpatient Hospital Stay

## 2023-05-23 DIAGNOSIS — E785 Hyperlipidemia, unspecified: Secondary | ICD-10-CM | POA: Diagnosis not present

## 2023-05-23 DIAGNOSIS — T402X5A Adverse effect of other opioids, initial encounter: Secondary | ICD-10-CM | POA: Diagnosis present

## 2023-05-23 DIAGNOSIS — N184 Chronic kidney disease, stage 4 (severe): Secondary | ICD-10-CM | POA: Diagnosis present

## 2023-05-23 DIAGNOSIS — M62838 Other muscle spasm: Secondary | ICD-10-CM | POA: Diagnosis present

## 2023-05-23 DIAGNOSIS — E1169 Type 2 diabetes mellitus with other specified complication: Secondary | ICD-10-CM | POA: Diagnosis present

## 2023-05-23 DIAGNOSIS — N3289 Other specified disorders of bladder: Secondary | ICD-10-CM | POA: Diagnosis present

## 2023-05-23 DIAGNOSIS — K860 Alcohol-induced chronic pancreatitis: Secondary | ICD-10-CM | POA: Diagnosis present

## 2023-05-23 DIAGNOSIS — K5903 Drug induced constipation: Secondary | ICD-10-CM | POA: Diagnosis present

## 2023-05-23 DIAGNOSIS — T83090A Other mechanical complication of cystostomy catheter, initial encounter: Secondary | ICD-10-CM | POA: Diagnosis present

## 2023-05-23 DIAGNOSIS — E1165 Type 2 diabetes mellitus with hyperglycemia: Secondary | ICD-10-CM | POA: Diagnosis present

## 2023-05-23 DIAGNOSIS — E1142 Type 2 diabetes mellitus with diabetic polyneuropathy: Secondary | ICD-10-CM | POA: Diagnosis present

## 2023-05-23 DIAGNOSIS — I129 Hypertensive chronic kidney disease with stage 1 through stage 4 chronic kidney disease, or unspecified chronic kidney disease: Secondary | ICD-10-CM | POA: Diagnosis present

## 2023-05-23 DIAGNOSIS — F1020 Alcohol dependence, uncomplicated: Secondary | ICD-10-CM | POA: Diagnosis present

## 2023-05-23 DIAGNOSIS — Z794 Long term (current) use of insulin: Secondary | ICD-10-CM | POA: Diagnosis not present

## 2023-05-23 DIAGNOSIS — E1122 Type 2 diabetes mellitus with diabetic chronic kidney disease: Secondary | ICD-10-CM | POA: Diagnosis present

## 2023-05-23 DIAGNOSIS — Y738 Miscellaneous gastroenterology and urology devices associated with adverse incidents, not elsewhere classified: Secondary | ICD-10-CM | POA: Diagnosis present

## 2023-05-23 DIAGNOSIS — R310 Gross hematuria: Secondary | ICD-10-CM | POA: Diagnosis present

## 2023-05-23 DIAGNOSIS — E7800 Pure hypercholesterolemia, unspecified: Secondary | ICD-10-CM | POA: Diagnosis present

## 2023-05-23 DIAGNOSIS — E1136 Type 2 diabetes mellitus with diabetic cataract: Secondary | ICD-10-CM | POA: Diagnosis present

## 2023-05-23 DIAGNOSIS — D62 Acute posthemorrhagic anemia: Secondary | ICD-10-CM | POA: Diagnosis present

## 2023-05-23 DIAGNOSIS — J45909 Unspecified asthma, uncomplicated: Secondary | ICD-10-CM | POA: Diagnosis present

## 2023-05-23 DIAGNOSIS — F419 Anxiety disorder, unspecified: Secondary | ICD-10-CM | POA: Diagnosis present

## 2023-05-23 DIAGNOSIS — N319 Neuromuscular dysfunction of bladder, unspecified: Secondary | ICD-10-CM | POA: Diagnosis present

## 2023-05-23 DIAGNOSIS — E871 Hypo-osmolality and hyponatremia: Secondary | ICD-10-CM | POA: Diagnosis present

## 2023-05-23 DIAGNOSIS — N179 Acute kidney failure, unspecified: Secondary | ICD-10-CM | POA: Diagnosis present

## 2023-05-23 DIAGNOSIS — R339 Retention of urine, unspecified: Secondary | ICD-10-CM | POA: Diagnosis present

## 2023-05-23 LAB — GLUCOSE, CAPILLARY
Glucose-Capillary: 144 mg/dL — ABNORMAL HIGH (ref 70–99)
Glucose-Capillary: 281 mg/dL — ABNORMAL HIGH (ref 70–99)
Glucose-Capillary: 221 mg/dL — ABNORMAL HIGH (ref 70–99)
Glucose-Capillary: 228 mg/dL — ABNORMAL HIGH (ref 70–99)

## 2023-05-23 LAB — BASIC METABOLIC PANEL WITH GFR
Anion gap: 15 (ref 5–15)
BUN: 28 mg/dL — ABNORMAL HIGH (ref 8–23)
CO2: 15 mmol/L — ABNORMAL LOW (ref 22–32)
Calcium: 8.1 mg/dL — ABNORMAL LOW (ref 8.9–10.3)
Chloride: 103 mmol/L (ref 98–111)
Creatinine, Ser: 2.19 mg/dL — ABNORMAL HIGH (ref 0.44–1.00)
GFR, Estimated: 24 mL/min — ABNORMAL LOW (ref >60)
Glucose, Bld: 160 mg/dL — ABNORMAL HIGH (ref 70–99)
Potassium: 3.9 mmol/L (ref 3.5–5.1)
Sodium: 133 mmol/L — ABNORMAL LOW (ref 135–145)

## 2023-05-23 LAB — PREPARE RBC (CROSSMATCH)

## 2023-05-23 LAB — CBC
HCT: 22.6 % — ABNORMAL LOW (ref 36.0–46.0)
Hemoglobin: 7.5 g/dL — ABNORMAL LOW (ref 12.0–15.0)
MCH: 30.5 pg (ref 26.0–34.0)
MCHC: 33.2 g/dL (ref 30.0–36.0)
MCV: 91.9 fL (ref 80.0–100.0)
Platelets: 176 10*3/uL (ref 150–400)
RBC: 2.46 MIL/uL — ABNORMAL LOW (ref 3.87–5.11)
RDW: 11.9 % (ref 11.5–15.5)
WBC: 10.6 10*3/uL — ABNORMAL HIGH (ref 4.0–10.5)
nRBC: 0 % (ref 0.0–0.2)

## 2023-05-23 LAB — HEMOGLOBIN AND HEMATOCRIT, BLOOD
HCT: 29.8 % — ABNORMAL LOW (ref 36.0–46.0)
Hemoglobin: 10.1 g/dL — ABNORMAL LOW (ref 12.0–15.0)

## 2023-05-23 MED ORDER — TRANEXAMIC ACID 650 MG PO TABS
1300.0000 mg | ORAL_TABLET | Freq: Once | ORAL | Status: AC
  Administered 2023-05-23: 1300 mg via ORAL
  Filled 2023-05-23: qty 2

## 2023-05-23 MED ORDER — SODIUM CHLORIDE 0.9% IV SOLUTION: Freq: Once | INTRAVENOUS | Status: AC

## 2023-05-23 NOTE — Progress Notes (Signed)
  Progress Note   Patient: Karen [name] ION:[id number] DOB: [birth year] DOA: 05/21/2023     0 DOS: the patient was seen and examined on 05/23/2023   Brief hospital course:  "Ms. Eileen Seybold is a 68 year old female with history of insulin -dependent diabetes mellitus, hypertension, hyperlipidemia, insomnia, neurogenic bladder secondary to poorly controlled diabetes mellitus, who presents emergency department for chief concerns of hematuria. " .Aaron AasAaron AasSee H&P for full HPI on admission & ED course.  Of note, patient had undergone placement of a suprapubic catheter on 05/20/23, the day before presenting.  ED provider discussed case with IR.  It was not recommended to attempt replacing the catheter due to immature tract and risk of losing access.  Bladder irrigation from standard Foley was recommended.  Patient was admitted for further evaluation and management as outlined in detail below.   Assessment and Plan:  * Hematuria Acute blood loss anemia Status post three-way Foley catheter placement via urethra with CBI Hbg 7.5 this AM with ongoing bleeding --Urology consulted --Continue CBI, titrate to light pink output --Monitor urine output --Type and screen --Transfuse 1 unit PRBC's today --Trend Hbg and transfuse if < 8 with active bleeding or if < 7  Anxiety, Insomnia At risk for polypharmacy  --Continue home Valium , Ambien  --Monitor closely  Chronic pancreatitis due to chronic alcoholism (HCC) --Pancrelipase    AKI (acute kidney injury) - Suspect secondary to obstructive uropathy --Strict I's and O's --IV fluids --Monitor BMP --Renally dose meds & avoid nephrotoxins  Type 2 diabetes mellitus with hyperlipidemia (HCC) --Hold home regimen --Sliding scale Novolog   Hypertension --Continue amlodipine   --Holding hydrochlorothiazide         Subjective: Pt seen this AM at bedside. She still has bleeding.  Lower abdominal discomfort continues, aggravated by trying to move around or sit up  in the bed.  She reports fatigue and generalized weakness.    Physical Exam: Vitals:   05/23/23 0344 05/23/23 0836 05/23/23 1136 05/23/23 1205  BP: (!) 123/55 (!) 142/63 (!) 124/59 (!) 120/58  Pulse: (!) 102 96 88 87  Resp: 18 18 18 18   Temp: 100.3 F (37.9 C) 97.7 F (36.5 C) 98.8 F (37.1 C) 99.3 F (37.4 C)  TempSrc: Oral Oral Oral Oral  SpO2: 97% 97% 97% 98%  Weight:      Height:       General exam: awake, alert, no acute distress HEENT: moist mucus membranes, hearing grossly normal  Respiratory system: CTAB, no wheezes, rales or rhonchi, normal respiratory effort. Cardiovascular system: normal S1/S2, RRR, no pedal edema.   Gastrointestinal system: soft, central lower abdominal tenderness without rebound or guarding Genitourinary system: Foley in place with CBI, gross hematuria noted light red Central nervous system: A&O x 3. no gross focal neurologic deficits, normal speech Extremities: moves all , no edema, normal tone Psychiatry: normal mood, congruent affect, judgement and insight appear normal   Data Reviewed:  Notable labs --  Hbg trend 12.5 >> 10.7 >> 8.8 >> 8.9 >> 7.5  Na 133 Bicarb 20 >> 15 Glucose 160 Cr 2.54 >> 2.52 >> 2.19  Ca 8.1  WBC 8.3 >> 10.6   Family Communication: husband at bedside on rounds 5/17  Disposition: Status is: Observation Remains admitted with ongoing hematuria with acute blood loss anemia requiring blood transfusion    Planned Discharge Destination: Home    Time spent: 42 minutes  Author: Montey Apa, DO 05/23/2023 1:54 PM  For on call review [email or web address].

## 2023-05-23 NOTE — Plan of Care (Signed)
   Problem: Education: Goal: Knowledge of General Education information will improve Description: Including pain rating scale, medication(s)/side effects and non-pharmacologic comfort measures Outcome: Progressing   Problem: Activity: Goal: Risk for activity intolerance will decrease Outcome: Progressing   Problem: Pain Managment: Goal: General experience of comfort will improve and/or be controlled Outcome: Progressing

## 2023-05-23 NOTE — Progress Notes (Signed)
  Subjective: No acute events overnight. Blood counts trended down  Objective: Vital signs in last 24 hours: Temp:  [99 F (37.2 C)-100.3 F (37.9 C)] 100.3 F (37.9 C) (05/18 0344) Pulse Rate:  [95-102] 102 (05/18 0344) Resp:  [16-18] 18 (05/18 0344) BP: (123-158)/(55-74) 123/55 (05/18 0344) SpO2:  [97 %-100 %] 97 % (05/18 0344)  Intake/Output from previous day: 05/17 0701 - 05/18 0700 In: [address] [P.O.:240; I.V.:642] Out: 60454 [Urine:35090] Intake/Output this shift: Total I/O In: 6000 [Other:6000] Out: 4240 [Urine:4240]  Physical Exam:  General: Alert and oriented CV: RRR Lungs: Clear Abdomen: Soft, ND, ATTP Ext: NT, No erythema  Urine looks clear pink on low/med rate  Lab Results: Recent Labs    05/22/23 0525 05/22/23 1525 05/23/23 0517  HGB 8.8* 8.9* 7.5*  HCT 26.6* 26.4* 22.6*   BMET Recent Labs    05/22/23 0525 05/23/23 0517  NA 134* 133*  K 4.4 3.9  CL 102 103  CO2 20* 15*  GLUCOSE 184* 160*  BUN 37* 28*  CREATININE 2.52* 2.19*  CALCIUM  8.3* 8.1*     Studies/Results: CT PELVIS WO CONTRAST Result Date: 05/21/2023 CLINICAL DATA:  Hematuria with dizziness and weakness EXAM: CT PELVIS WITHOUT CONTRAST TECHNIQUE: Multidetector CT imaging of the pelvis was performed following the standard protocol without intravenous contrast. RADIATION DOSE REDUCTION: This exam was performed according to the departmental dose-optimization program which includes automated exposure control, adjustment of the mA and/or kV according to patient size and/or use of iterative reconstruction technique. COMPARISON:  CT abdomen and pelvis dated 04/28/2023 FINDINGS: Urinary Tract: Interval placement of a suprapubic catheter. Urinary bladder is distended and contains large volume irregular hyperdensity measuring 7.7 x 6.2 x 7.4 cm (2:29, 5:47). Small volume intraluminal gas. Bowel: Partially imaged bowel without bowel wall thickening, distention, or inflammatory changes. Colonic  diverticulosis without acute diverticulitis. Appendix is not discretely seen. Vascular/Lymphatic: Aortic atherosclerosis. No enlarged abdominal or pelvic lymph nodes. Reproductive: No adnexal masses. Other: No free fluid, fluid collection, or free air. Musculoskeletal: No acute or abnormal lytic or blastic osseous lesions. 1.7 cm lipoma within the right iliopsoas (2:34). IMPRESSION: 1. Interval placement of a suprapubic catheter. Urinary bladder is distended and contains large volume irregular hyperdensity, likely hematoma. 2. Colonic diverticulosis without acute diverticulitis. 3.  Aortic Atherosclerosis (ICD10-I70.0). Electronically Signed   By: Limin  Xu M.D.   On: 05/21/2023 10:32    Assessment/Plan: Karen [name] is a 68 yo F with hematuria, clot retention s/p SP tube placement 5/15.   Urine looks ok this morning on low medium rate, improved from yesterday. I hand irrigated with minimal clot return. Also SP tube seems to be in the correct place. It is not sutured in so I added 5 cc to the balloon  --TXA given x1 this morning, will write for one additional dose today --continue CBI, can titrate to light pink   LOS: 0 days   Julene Oaks MD 05/23/2023, 8:32 AM [facility]  Pager: [phone]

## 2023-05-24 DIAGNOSIS — N319 Neuromuscular dysfunction of bladder, unspecified: Secondary | ICD-10-CM

## 2023-05-24 DIAGNOSIS — R310 Gross hematuria: Secondary | ICD-10-CM | POA: Diagnosis not present

## 2023-05-24 DIAGNOSIS — N3289 Other specified disorders of bladder: Secondary | ICD-10-CM

## 2023-05-24 LAB — CBC
HCT: 29.4 % — ABNORMAL LOW (ref 36.0–46.0)
Hemoglobin: 10.1 g/dL — ABNORMAL LOW (ref 12.0–15.0)
MCH: 30 pg (ref 26.0–34.0)
MCHC: 34.4 g/dL (ref 30.0–36.0)
MCV: 87.2 fL (ref 80.0–100.0)
Platelets: 181 10*3/uL (ref 150–400)
RBC: 3.37 MIL/uL — ABNORMAL LOW (ref 3.87–5.11)
RDW: 12.2 % (ref 11.5–15.5)
WBC: 8.4 10*3/uL (ref 4.0–10.5)
nRBC: 0 % (ref 0.0–0.2)

## 2023-05-24 LAB — GLUCOSE, CAPILLARY
Glucose-Capillary: 205 mg/dL — ABNORMAL HIGH (ref 70–99)
Glucose-Capillary: 309 mg/dL — ABNORMAL HIGH (ref 70–99)
Glucose-Capillary: 209 mg/dL — ABNORMAL HIGH (ref 70–99)
Glucose-Capillary: 238 mg/dL — ABNORMAL HIGH (ref 70–99)

## 2023-05-24 LAB — BASIC METABOLIC PANEL WITH GFR
Anion gap: 11 (ref 5–15)
BUN: 24 mg/dL — ABNORMAL HIGH (ref 8–23)
CO2: 19 mmol/L — ABNORMAL LOW (ref 22–32)
Calcium: 8.5 mg/dL — ABNORMAL LOW (ref 8.9–10.3)
Chloride: 103 mmol/L (ref 98–111)
Creatinine, Ser: 1.99 mg/dL — ABNORMAL HIGH (ref 0.44–1.00)
GFR, Estimated: 27 mL/min — ABNORMAL LOW (ref >60)
Glucose, Bld: 215 mg/dL — ABNORMAL HIGH (ref 70–99)
Potassium: 3.8 mmol/L (ref 3.5–5.1)
Sodium: 133 mmol/L — ABNORMAL LOW (ref 135–145)

## 2023-05-24 LAB — TYPE AND SCREEN
ABO/RH(D): B POS
Antibody Screen: NEGATIVE
Unit division: 0

## 2023-05-24 LAB — BPAM RBC
Blood Product Expiration Date: 202506132359
ISSUE DATE / TIME: 202505181143
Unit Type and Rh: 7300

## 2023-05-24 MED ORDER — INSULIN GLARGINE-YFGN 100 UNIT/ML ~~LOC~~ SOLN
8.0000 [IU] | Freq: Every day | SUBCUTANEOUS | Status: DC
  Administered 2023-05-24 – 2023-05-26 (×3): 8 [IU] via SUBCUTANEOUS
  Filled 2023-05-24 (×4): qty 0.08

## 2023-05-24 NOTE — TOC Initial Note (Signed)
 Transition of Care [facility]) - Initial/Assessment Note    Patient Details  Name: Karen [name] MRN: [id number] Date of Birth: May 24, 1955  [facility] CM/SW Contact:    Odilia Bennett, LCSW Phone Number: 05/24/2023, 11:31 AM  Clinical Narrative:  Readmission prevention screen complete. CSW met with patient. No family at bedside. CSW introduced role and explained that discharge planning would be discussed. PCP is Evander Hills, FNP. Patient has transportation to appointments. Pharmacy is [facility]. No issues obtaining medications. Patient lives with her significant other of 19 years. No home health prior to admission. She has a wheelchair and SPC at home. No further concerns. CSW will continue to follow patient for support and facilitate return home once stable. Her significant other will transport her home at discharge.                Expected Discharge Plan: Home/Self Care Barriers to Discharge: Continued Medical Work up   Patient Goals and CMS Choice            Expected Discharge Plan and Services     Post Acute Care Choice: NA Living arrangements for the past 2 months: Single Family Home                                      Prior Living Arrangements/Services Living arrangements for the past 2 months: Single Family Home Lives with:: Significant Other Patient language and need for interpreter reviewed:: Yes Do you feel safe going back to the place where you live?: Yes      Need for Family Participation in Patient Care: Yes (Comment) Care giver support system in place?: Yes (comment) Current home services: DME Criminal Activity/Legal Involvement Pertinent to Current Situation/Hospitalization: No - Comment as needed  Activities of Daily Living   ADL Screening (condition at time of admission) Independently performs ADLs?: Yes (appropriate for developmental age) Is the patient deaf or have difficulty hearing?: No Does the patient have difficulty seeing,  even when wearing glasses/contacts?: No Does the patient have difficulty concentrating, remembering, or making decisions?: No  Permission Sought/Granted                  Emotional Assessment Appearance:: Appears stated age Attitude/Demeanor/Rapport: Engaged, Gracious Affect (typically observed): Accepting, Appropriate, Calm, Pleasant Orientation: : Oriented to Self, Oriented to Place, Oriented to  Time, Oriented to Situation Alcohol / Substance Use: Not Applicable Psych Involvement: No (comment)  Admission diagnosis:  Urinary retention [R33.9] Gross hematuria [R31.0] Hematuria [R31.9] Obstruction of suprapubic catheter, initial encounter (HCC) [T83.090A] Patient Active Problem List   Diagnosis Date Noted   Hematuria 05/21/2023   At risk for polypharmacy 05/21/2023   Hyponatremia 04/03/2023   Secondary hyperparathyroidism of renal origin (HCC) 03/31/2023   Benign hypertensive kidney disease with chronic kidney disease 02/10/2023   Proteinuria 02/10/2023   Chronic pancreatitis due to chronic alcoholism (HCC) 04/02/2022   Type 2 diabetes mellitus with hyperglycemia, with long-term current use of insulin  (HCC)    Pancreas divisum 11/08/2021   Therapeutic opioid induced constipation 11/08/2021   Urinary retention 10/30/2021   Acute cystitis 05/18/2020   Diabetic polyneuropathy associated with type 2 diabetes mellitus (HCC)    Hypertension 03/15/2019   Chronic kidney disease, stage 3b (HCC) 03/15/2019   Type II diabetes mellitus with renal manifestations (HCC) 03/15/2019   Abnormal LFTs 01/24/2019   HLD (hyperlipidemia)    Type 2 diabetes  mellitus with hyperlipidemia (HCC)    GERD (gastroesophageal reflux disease)    AKI (acute kidney injury) (HCC)    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 [place] [address] Phone: [phone] Fax:  [phone]     Social Drivers of Health (SDOH) Social History: SDOH Screenings   Food Insecurity: No Food Insecurity (05/21/2023)  Housing: Low Risk  (05/21/2023)  Transportation Needs: No Transportation Needs (05/21/2023)  Utilities: Not At Risk (05/21/2023)  Depression (PHQ2-9): High Risk (04/12/2023)  Financial Resource Strain: Low Risk  (10/31/2021)   Received from [facility], [facility]  Social Connections: Socially Isolated (05/21/2023)  Tobacco Use: Medium Risk (05/21/2023)   SDOH Interventions:     Readmission Risk Interventions    05/24/2023   11:28 AM  Readmission Risk Prevention Plan  Transportation Screening Complete  PCP or Specialist Appt within 3-5 Days Complete  Social Work Consult for Recovery Care Planning/Counseling Complete  Palliative Care Screening Not Applicable  Medication Review [employer]) Complete

## 2023-05-24 NOTE — Progress Notes (Signed)
 Urology Inpatient Progress Note  Subjective: No acute events overnight. She is afebrile, VSS. Hemoglobin stable, 10.1.  WBC count down, 8.4.  Creatinine down, 1.99. Foley catheter in place draining red urine without obstructing clots.  CBI bag is run dry, unclear duration without irrigation.  Patient is asleep and unable to contribute to HPI.  Anti-infectives: Anti-infectives (From admission, onward)    None       Current Facility-Administered Medications  Medication Dose Route Frequency Provider Last Rate Last Admin   acetaminophen  (TYLENOL ) tablet 650 mg  650 mg Oral Q6H PRN Cox, Amy N, DO   650 mg at 05/23/23 1610   Or   acetaminophen  (TYLENOL ) suppository 650 mg  650 mg Rectal Q6H PRN Cox, Amy N, DO       amLODipine  (NORVASC ) tablet 10 mg  10 mg Oral Daily Cox, Amy N, DO   10 mg at 05/23/23 9604   ARIPiprazole  (ABILIFY ) tablet 5 mg  5 mg Oral Daily Cox, Amy N, DO   5 mg at 05/22/23 5409   Chlorhexidine  Gluconate Cloth 2 % PADS 6 each  6 each Topical Daily Darus Engels A, DO   6 each at 05/23/23 8119   cyclobenzaprine  (FLEXERIL ) tablet 5 mg  5 mg Oral BID PRN Cox, Amy N, DO   5 mg at 05/23/23 1716   diazepam  (VALIUM ) tablet 2 mg  2 mg Oral Q12H PRN Cox, Amy N, DO   2 mg at 05/23/23 1478   famotidine  (PEPCID ) tablet 20 mg  20 mg Oral QHS Cox, Amy N, DO   20 mg at 05/23/23 2126   feeding supplement (ENSURE ENLIVE / ENSURE PLUS) liquid 237 mL  237 mL Oral BID BM Cox, Amy N, DO   237 mL at 05/23/23 1405   HYDROcodone -acetaminophen  (NORCO/VICODIN) 5-325 MG per tablet 1 tablet  1 tablet Oral Q6H PRN Cox, Amy N, DO   1 tablet at 05/23/23 1444   hydrOXYzine  (ATARAX ) tablet 25 mg  25 mg Oral Daily PRN Cox, Amy N, DO   25 mg at 05/22/23 2956   icosapent  Ethyl (VASCEPA ) 1 g capsule 2 g  2 g Oral BID Cox, Amy N, DO   2 g at 05/23/23 2125   insulin  aspart (novoLOG ) injection 0-5 Units  0-5 Units Subcutaneous QHS Cox, Amy N, DO   2 Units at 05/23/23 2125   insulin  aspart (novoLOG ) injection  0-9 Units  0-9 Units Subcutaneous TID WC Cox, Amy N, DO   3 Units at 05/23/23 1637   lipase/protease/amylase (CREON ) capsule 72,000 Units  72,000 Units Oral TID WC Cox, Amy N, DO   72,000 Units at 05/23/23 1638   nortriptyline  (PAMELOR ) capsule 25 mg  25 mg Oral QHS Cox, Amy N, DO   25 mg at 05/23/23 2125   ondansetron  (ZOFRAN ) tablet 4 mg  4 mg Oral Q6H PRN Cox, Amy N, DO       Or   ondansetron  (ZOFRAN ) injection 4 mg  4 mg Intravenous Q6H PRN Cox, Amy N, DO   4 mg at 05/21/23 2320   Oral care mouth rinse  15 mL Mouth Rinse PRN Cox, Amy N, DO       pantoprazole  (PROTONIX ) EC tablet 80 mg  80 mg Oral Daily Cox, Amy N, DO   80 mg at 05/23/23 0837   polyethylene glycol (MIRALAX  / GLYCOLAX ) packet 17 g  17 g Oral QHS Cox, Amy N, DO   17 g at 05/21/23 2107   senna-docusate (  Senokot-S) tablet 1 tablet  1 tablet Oral QHS PRN Cox, Amy N, DO   1 tablet at 05/23/23 2125   sodium chloride  irrigation 0.9 % 3,000 mL  3,000 mL Irrigation Continuous Cox, Amy N, DO   3,000 mL at 05/23/23 2326   zolpidem  (AMBIEN ) tablet 5 mg  5 mg Oral QHS PRN Cox, Amy N, DO   5 mg at 05/23/23 2126     Objective: Vital signs in last 24 hours: Temp:  [98.1 F (36.7 C)-99.3 F (37.4 C)] 99.1 F (37.3 C) (05/19 0852) Pulse Rate:  [85-101] 97 (05/19 0852) Resp:  [10-18] 18 (05/19 0852) BP: (114-137)/(58-80) 137/64 (05/19 0852) SpO2:  [97 %-100 %] 98 % (05/19 0852)  Intake/Output from previous day: 05/18 0701 - 05/19 0700 In: 19421.3 [P.O.:600; I.V.:5.3; Blood:276] Out: 29562 [Urine:28690] Intake/Output this shift: No intake/output data recorded.  Physical Exam Vitals and nursing note reviewed.  Constitutional:      General: She is sleeping. She is not in acute distress.    Appearance: Normal appearance.  HENT:     Head: Normocephalic and atraumatic.  Pulmonary:     Effort: Pulmonary effort is normal. No respiratory distress.  Skin:    General: Skin is warm and dry.     Lab Results:  Recent Labs     05/23/23 0517 05/23/23 1535 05/24/23 0531  WBC 10.6*  --  8.4  HGB 7.5* 10.1* 10.1*  HCT 22.6* 29.8* 29.4*  PLT 176  --  181   BMET Recent Labs    05/23/23 0517 05/24/23 0531  NA 133* 133*  K 3.9 3.8  CL 103 103  CO2 15* 19*  GLUCOSE 160* 215*  BUN 28* 24*  CREATININE 2.19* 1.99*  CALCIUM  8.1* 8.5*   Studies/Results: US  PELVIS (TRANSABDOMINAL ONLY) Result Date: 05/23/2023 CLINICAL DATA:  [id number] Hematuria [id number] EXAM: LIMITED ULTRASOUND OF PELVIS TECHNIQUE: Limited transabdominal ultrasound examination of the pelvis was performed. COMPARISON:  May 21, 2023 FINDINGS: Targeted ultrasound was performed of the bladder. Bladder is decompressed around a Foley catheter. The bladder appears thick walled with adjacent heterogeneous material. No definitive internal vascularity is seen within this material on limited provided images. Bladder volume is approximately 73 ML. IMPRESSION: The bladder is decompressed around a Foley catheter. The bladder appears thick walled with adjacent heterogeneous material, favored blood products. Recommend correlation with urinalysis. Electronically Signed   By: Clancy Crimes M.D.   On: 05/23/2023 14:12   Assessment & Plan: 68 y.o. female with PMH neurogenic bladder s/p SP tube placement with IR 3 days ago now admitted with gross hematuria and clot retention on CBI and s/p TXA x 2 and 1 unit PRBCs.  I unclamped the full CBI bag at the bedside to resume moderate drip.  Will reassess at midday.  Lawayne Hartig, PA-C 05/24/2023

## 2023-05-24 NOTE — Progress Notes (Signed)
 Brief Urology Progress Note  I returned to the bedside at midday today. Patient reported abdominal tightness and discomfort. Abdomen soft on exam, nonpalpable bladder. Foley catheter in place draining red urine on slow drip CBI.  Catheter irrigated at the bedside as below. The catheter did not appear obstructed, though I was able to clear some clot material from the bladder. She reported resolution of her abdominal discomfort following irrigation.  Given manipulation, would recommend empiric antibiotics for UTI prevention.  Bladder Irrigation  Due to gross hematuria patient is present today for a bladder irrigation. 300 mL of sterile water was instilled into the bladder with a 70mL Toomey syringe through the catheter in place.  of urine return was cleared from the bladder with evacuation of 10ccs of clot material. Efflux cleared from red to light red with the procedure and the catheter irrigated easily. Upon completion, the catheter was draining well and was reattached to the night bag for drainage. CBI was resumed at slow drip pace and efflux was pink. Patient tolerated well.   Performed by: Maralee Higuchi, PA-C   Plan: -Continue CBI, titrate to keep efflux pink or clearer in color -Manually irrigate Foley as needed to keep it draining -Daily CBC -Consider antibiotics for UTI prevention  Kathreen Pare, PA-C 05/24/23  12:21 PM

## 2023-05-24 NOTE — Inpatient Diabetes Management (Addendum)
 Inpatient Diabetes Program Recommendations  AACE/ADA: New Consensus Statement on Inpatient Glycemic Control (2015)  Target Ranges:  Prepandial:   less than 140 mg/dL      Peak postprandial:   less than 180 mg/dL (1-2 hours)      Critically ill patients:  140 - 180 mg/dL   Lab Results  Component Value Date   GLUCAP 205 (H) 05/24/2023   HGBA1C 10.1 (H) 04/12/2023    Review of Glycemic Control  Latest Reference Range & Units 05/22/23 21:14 05/23/23 08:15 05/23/23 11:49 05/23/23 16:29 05/23/23 21:17 05/24/23 09:16  Glucose-Capillary 70 - 99 mg/dL 147 (H) 829 (H) 562 (H) 221 (H) 228 (H) 205 (H)  (H): Data is abnormally high Diabetes history: Type 2 DM Outpatient Diabetes medications: Novolog  70/30 15 units every day, Mounjaro  5 mg qwk, Jardiance 25 mg QD Current orders for Inpatient glycemic control: Novolog  0-9 units TID & HS  Inpatient Diabetes Program Recommendations:    Consider adding Semglee  8 units every day. Will plan to see.  Addendum: Spoke with patient regarding outpatient diabetes management. Patient reports that she sometimes misses doses.  Reviewed patient's current A1c of 10.1%. Explained what a A1c is and what it measures. Also reviewed goal A1c with patient, importance of good glucose control @ home, and blood sugar goals. Reviewed patho of DM, need for improved control, role of insulin , inpact of infection, vascular changes and commorbidities. Patient has a meter and uses it mostly twice per day. Encouraged to reach out to PCP if >180 mg/dL consistently.  Denies drinking sugary beverages. Reviewed alternatives and encouraged continued CHO mindfulness.  No additional questions at thsi time.     Thanks, Marjo Sievert, MSN, RNC-OB Diabetes Coordinator [phone] (8a-5p)

## 2023-05-24 NOTE — Plan of Care (Signed)
  Problem: Education: Goal: Knowledge of General Education information will improve Description: Including pain rating scale, medication(s)/side effects and non-pharmacologic comfort measures Outcome: Progressing   Problem: Health Behavior/Discharge Planning: Goal: Ability to manage health-related needs will improve Outcome: Progressing   Problem: Clinical Measurements: Goal: Ability to maintain clinical measurements within normal limits will improve Outcome: Progressing Goal: Will remain free from infection Outcome: Progressing Goal: Diagnostic test results will improve Outcome: Progressing Goal: Respiratory complications will improve Outcome: Progressing Goal: Cardiovascular complication will be avoided Outcome: Progressing   Problem: Activity: Goal: Risk for activity intolerance will decrease Outcome: Progressing   Problem: Nutrition: Goal: Adequate nutrition will be maintained Outcome: Progressing   Problem: Coping: Goal: Level of anxiety will decrease Outcome: Progressing   Problem: Elimination: Goal: Will not experience complications related to bowel motility Outcome: Progressing   Problem: Elimination: Goal: Will not experience complications related to bowel motility Outcome: Progressing Goal: Will not experience complications related to urinary retention Outcome: Progressing   Problem: Pain Managment: Goal: General experience of comfort will improve and/or be controlled Outcome: Progressing

## 2023-05-24 NOTE — Progress Notes (Addendum)
  Progress Note   Patient: Karen [name] BJY:[id number] DOB: 02-Jan-1956 DOA: 05/21/2023     1 DOS: the patient was seen and examined on 05/24/2023   Brief hospital course:  "Karen [name] is a 68 year old female with history of insulin -dependent diabetes mellitus, hypertension, hyperlipidemia, insomnia, neurogenic bladder secondary to poorly controlled diabetes mellitus, who presents emergency department for chief concerns of hematuria. " .Aaron AasAaron AasSee H&P for full HPI on admission & ED course.  Of note, patient had undergone placement of a suprapubic catheter on 05/20/23, the day before presenting.  ED provider discussed case with IR.  It was not recommended to attempt replacing the catheter due to immature tract and risk of losing access.  Bladder irrigation from standard Foley was recommended.  Patient was admitted for further evaluation and management as outlined in detail below.   Assessment and Plan:  * Hematuria Acute blood loss anemia Status post three-way Foley catheter placement via urethra with CBI 5/18 Hbg 7.5 with ongoing bleeding, transfused 1 unit RBC's 5/19 Hbg 10.1 >> 10.1 after transfusion --Urology consulted --Continue CBI, titrate to light pink output --Monitor urine output --Type and screen --Trend Hbg and transfuse if < 8 with active bleeding or if < 7  Anxiety, Insomnia At risk for polypharmacy  --Continue home Valium , Ambien  --Monitor closely  Chronic pancreatitis due to chronic alcoholism (HCC) --Pancrelipase    AKI (acute kidney injury) - Suspect secondary to obstructive uropathy --Strict I's and O's --IV fluids --Monitor BMP --Renally dose meds & avoid nephrotoxins  Type 2 diabetes mellitus with hyperlipidemia (HCC) --Hold home regimen --Sliding scale Novolog  --Add 8 units Semglee  at beditme  Hypertension --Continue amlodipine   --Holding hydrochlorothiazide         Subjective: Pt seen this AM at bedside. Continues with CBI and ongoing hematuria.   Abdominal discomfort little better than yesterday.  Wants to shower but doesn't feel she can tolerate being up that long, having dizziness.   Physical Exam: Vitals:   05/23/23 1631 05/23/23 2000 05/24/23 0852 05/24/23 1430  BP: 133/60 125/60 137/64 117/65  Pulse: (!) 101 85 97 93  Resp: 16 18 18 17   Temp: 99 F (37.2 C) 99 F (37.2 C) 99.1 F (37.3 C) 98.7 F (37.1 C)  TempSrc: Oral Oral Oral Oral  SpO2: 100% 100% 98% 100%  Weight:      Height:       General exam: awake, alert, no acute distress HEENT: moist mucus membranes, hearing grossly normal  Respiratory system: CTAB, no wheezes, rales or rhonchi, normal respiratory effort. Cardiovascular system: normal S1/S2, RRR, no pedal edema.   Gastrointestinal system: soft, central lower abdominal tenderness without rebound or guarding Genitourinary system: Foley in place with CBI, gross hematuria noted light red Central nervous system: A&O x 3. no gross focal neurologic deficits, normal speech Extremities: moves all , no edema, normal tone Psychiatry: normal mood, congruent affect, judgement and insight appear normal   Data Reviewed:  Notable labs --  Hbg trend 12.5 >> 10.7 >> 8.8 >> 8.9 >> 7.5  Na 133 Bicarb 20 >> 15 Glucose 160 Cr 2.54 >> 2.52 >> 2.19  Ca 8.1  WBC 8.3 >> 10.6   Family Communication: husband at bedside on rounds 5/17  Disposition: Status is: Observation Remains admitted with ongoing hematuria with acute blood loss anemia requiring blood transfusion    Planned Discharge Destination: Home    Time spent: 42 minutes  Author: Montey Apa, DO 05/24/2023 2:34 PM  For on call review [email or web address].

## 2023-05-25 ENCOUNTER — Inpatient Hospital Stay

## 2023-05-25 DIAGNOSIS — R310 Gross hematuria: Secondary | ICD-10-CM | POA: Diagnosis not present

## 2023-05-25 DIAGNOSIS — E1169 Type 2 diabetes mellitus with other specified complication: Secondary | ICD-10-CM

## 2023-05-25 DIAGNOSIS — E785 Hyperlipidemia, unspecified: Secondary | ICD-10-CM

## 2023-05-25 LAB — CBC
HCT: 30.1 % — ABNORMAL LOW (ref 36.0–46.0)
Hemoglobin: 10.5 g/dL — ABNORMAL LOW (ref 12.0–15.0)
MCH: 30 pg (ref 26.0–34.0)
MCHC: 34.9 g/dL (ref 30.0–36.0)
MCV: 86 fL (ref 80.0–100.0)
Platelets: 221 10*3/uL (ref 150–400)
RBC: 3.5 MIL/uL — ABNORMAL LOW (ref 3.87–5.11)
RDW: 12.4 % (ref 11.5–15.5)
WBC: 7.5 10*3/uL (ref 4.0–10.5)
nRBC: 0 % (ref 0.0–0.2)

## 2023-05-25 LAB — BASIC METABOLIC PANEL WITH GFR
Anion gap: 9 (ref 5–15)
BUN: 26 mg/dL — ABNORMAL HIGH (ref 8–23)
CO2: 25 mmol/L (ref 22–32)
Calcium: 9 mg/dL (ref 8.9–10.3)
Chloride: 103 mmol/L (ref 98–111)
Creatinine, Ser: 1.87 mg/dL — ABNORMAL HIGH (ref 0.44–1.00)
GFR, Estimated: 29 mL/min — ABNORMAL LOW (ref >60)
Glucose, Bld: 192 mg/dL — ABNORMAL HIGH (ref 70–99)
Potassium: 3.7 mmol/L (ref 3.5–5.1)
Sodium: 137 mmol/L (ref 135–145)

## 2023-05-25 LAB — GLUCOSE, CAPILLARY
Glucose-Capillary: 245 mg/dL — ABNORMAL HIGH (ref 70–99)
Glucose-Capillary: 195 mg/dL — ABNORMAL HIGH (ref 70–99)
Glucose-Capillary: 100 mg/dL — ABNORMAL HIGH (ref 70–99)
Glucose-Capillary: 399 mg/dL — ABNORMAL HIGH (ref 70–99)

## 2023-05-25 MED ORDER — INSULIN ASPART 100 UNIT/ML IJ SOLN
3.0000 [IU] | Freq: Three times a day (TID) | INTRAMUSCULAR | Status: DC
  Administered 2023-05-25 – 2023-05-26 (×3): 3 [IU] via SUBCUTANEOUS
  Filled 2023-05-25 (×4): qty 1

## 2023-05-25 MED ORDER — SENNOSIDES-DOCUSATE SODIUM 8.6-50 MG PO TABS
1.0000 | ORAL_TABLET | Freq: Two times a day (BID) | ORAL | Status: DC
  Administered 2023-05-25 – 2023-05-26 (×4): 1 via ORAL
  Filled 2023-05-25 (×4): qty 1

## 2023-05-25 MED ORDER — LEVOFLOXACIN IN D5W 750 MG/150ML IV SOLN
750.0000 mg | INTRAVENOUS | Status: DC
Start: 2023-05-25 — End: 2023-05-25

## 2023-05-25 MED ORDER — LEVOFLOXACIN IN D5W 750 MG/150ML IV SOLN
750.0000 mg | INTRAVENOUS | Status: DC
  Administered 2023-05-25: 750 mg via INTRAVENOUS
  Filled 2023-05-25: qty 150

## 2023-05-25 MED ORDER — BISACODYL 5 MG PO TBEC
5.0000 mg | DELAYED_RELEASE_TABLET | Freq: Every day | ORAL | Status: DC | PRN
  Administered 2023-05-25: 5 mg via ORAL
  Filled 2023-05-25 (×2): qty 1

## 2023-05-25 NOTE — Progress Notes (Signed)
 Irrigated with Normal Saline 90mL in/out.

## 2023-05-25 NOTE — Progress Notes (Signed)
 Progress Note   Patient: DEMIA [name] JYN:[id number] DOB: 12-08-1955 DOA: 05/21/2023     2 DOS: the patient was seen and examined on 05/25/2023   Brief hospital course:  "Ms. Karen [name] is a 68 year old female with history of insulin -dependent diabetes mellitus, hypertension, hyperlipidemia, insomnia, neurogenic bladder secondary to poorly controlled diabetes mellitus, who presents emergency department for chief concerns of hematuria. " .Aaron AasAaron AasSee H&P for full HPI on admission & ED course.  Of note, patient had undergone placement of a suprapubic catheter on 05/20/23, the day before presenting.  ED provider discussed case with IR.  It was not recommended to attempt replacing the catheter due to immature tract and risk of losing access.  Bladder irrigation from standard Foley was recommended.  Patient was admitted for further evaluation and management as outlined in detail below.   Assessment and Plan:  * Hematuria Acute blood loss anemia Status post three-way Foley catheter placement via urethra with CBI 5/18 Hbg 7.5 with ongoing bleeding, transfused 1 unit RBC's 5/19 Hbg 10.1 >> 10.1 after transfusion 5/20 - Hbg 10.5.  Bleeding recurrent this afternoon. --Urology consulted --Continue CBI was clamped, recurrent bleeding today, CBI resumed per Urology --Monitor urine output --Trend Hbg and transfuse if < 8 with active bleeding or if < 7  Anxiety, Insomnia At risk for polypharmacy  --Continue home Valium , Ambien  --Monitor closely  Chronic pancreatitis due to chronic alcoholism (HCC) --Pancrelipase    AKI (acute kidney injury) - Suspect secondary to obstructive uropathy. Improved --Strict I's and O's --Encourage PO hydration --Monitor BMP --Renally dose meds & avoid nephrotoxins  Type 2 diabetes mellitus with hyperlipidemia  (HCC) CBG's still > 200  --Hold home regimen --Sliding scale Novolog  --8 units Semglee  at beditme --Add Novolog  3 TID WC  Hypertension --Continue  amlodipine   --Holding hydrochlorothiazide         Subjective: Pt seen this AM at bedside. She reports being hot and cold.  Had low grade fever overnight.  She denies N/V, cough, sore throat, congestion, diarrhea. Continues to feel weak.   Physical Exam: Vitals:   05/24/23 1430 05/24/23 1939 05/25/23 0426 05/25/23 0843  BP: 117/65 117/69 126/75 120/63  Pulse: 93 97 (!) 103 89  Resp: 17 16 16 17   Temp: 98.7 F (37.1 C) (!) 100.5 F (38.1 C) 98.8 F (37.1 C) 98.5 F (36.9 C)  TempSrc: Oral Oral  Oral  SpO2: 100% 95% 100% 98%  Weight:      Height:       General exam: awake, alert, no acute distress HEENT: moist mucus membranes, hearing grossly normal  Respiratory system: on room air, normal respiratory effort. Cardiovascular system: RRR, no pedal edema.   Gastrointestinal system: soft, non-distended, mildly tender suprapubic Genitourinary system: Foley in place with CBI, gross hematuria noted light red, SP cath Central nervous system: A&O x 3. no gross focal neurologic deficits, normal speech Extremities: moves all , no edema, normal tone Psychiatry: normal mood, congruent affect, judgement and insight appear normal   Data Reviewed:  Notable labs --  Hbg trend 12.5 >> 10.7 >> 8.8 >> 8.9 >> 7.5 (RBC transfusion) >> 10.5  Glucose 192 Cr 2.54 >> 2.52 >> 2.19 >> 1.87  WBC 8.3 >> 10.6 >> 7.5   Family Communication: husband at bedside on rounds 5/17. None present today.  Disposition: Status is: Observation Remains admitted with ongoing hematuria remains on CBI    Planned Discharge Destination: Home    Time spent: 42 minutes  Author: Montey Apa, DO 05/25/2023 4:28  PM  For on call review [email or web address].

## 2023-05-25 NOTE — Progress Notes (Addendum)
 Brief Urology Progress Note  I returned to the bedside this afternoon. RN had just manually irrigated her catheter. Efflux was pink on slow drip CBI.  Continue CBI overnight, manually irrigate as needed to clear any obstructing clots. Titrate flow to keep efflux pink or clearer in color. Will reassess in the morning.  We did discuss that if her hematuria persists tomorrow, we may need to consider cystoscopy with fulguration and clot evacuation on Thursday.   Kathreen Pare, PA-C 05/25/2023 8:31 PM

## 2023-05-25 NOTE — Progress Notes (Signed)
 Irrigated with 90 mL Normal Saline in/out.

## 2023-05-25 NOTE — Progress Notes (Signed)
 Urology Inpatient Progress Note  Subjective: No acute events overnight. She became febrile late yesterday, Tmax 38.1 C.  She is afebrile, VSS this morning. Hemoglobin up today, 10.5.  WBC count down, 7.5.  Creatinine down, 1.87. Foley catheter in place draining pink-tinged urine without obstructing clots on fast drip CBI. She reports continued bladder pressure/discomfort.  On antibiotics as below.  Anti-infectives: Anti-infectives (From admission, onward)    Start     Dose/Rate Route Frequency Ordered Stop   05/25/23 0930  levofloxacin  (LEVAQUIN ) IVPB 750 mg        750 mg 100 mL/hr over 90 Minutes Intravenous Every 48 hours 05/25/23 0831     05/25/23 0915  levofloxacin  (LEVAQUIN ) IVPB 750 mg  Status:  Discontinued        750 mg 100 mL/hr over 90 Minutes Intravenous Every 24 hours 05/25/23 0824 05/25/23 0831       Current Facility-Administered Medications  Medication Dose Route Frequency Provider Last Rate Last Admin   acetaminophen  (TYLENOL ) tablet 650 mg  650 mg Oral Q6H PRN Cox, Amy N, DO   650 mg at 05/23/23 1610   Or   acetaminophen  (TYLENOL ) suppository 650 mg  650 mg Rectal Q6H PRN Cox, Amy N, DO       amLODipine  (NORVASC ) tablet 10 mg  10 mg Oral Daily Cox, Amy N, DO   10 mg at 05/25/23 9604   ARIPiprazole  (ABILIFY ) tablet 5 mg  5 mg Oral Daily Cox, Amy N, DO   5 mg at 05/24/23 1034   bisacodyl  (DULCOLAX) EC tablet 5 mg  5 mg Oral Daily PRN Darus Engels A, DO       Chlorhexidine  Gluconate Cloth 2 % PADS 6 each  6 each Topical Daily Darus Engels A, DO   6 each at 05/25/23 5409   cyclobenzaprine  (FLEXERIL ) tablet 5 mg  5 mg Oral BID PRN Cox, Amy N, DO   5 mg at 05/25/23 8119   diazepam  (VALIUM ) tablet 2 mg  2 mg Oral Q12H PRN Cox, Amy N, DO   2 mg at 05/25/23 1001   famotidine  (PEPCID ) tablet 20 mg  20 mg Oral QHS Cox, Amy N, DO   20 mg at 05/24/23 2242   feeding supplement (ENSURE ENLIVE / ENSURE PLUS) liquid 237 mL  237 mL Oral BID BM Cox, Amy N, DO   237 mL at  05/25/23 1015   HYDROcodone -acetaminophen  (NORCO/VICODIN) 5-325 MG per tablet 1 tablet  1 tablet Oral Q6H PRN Cox, Amy N, DO   1 tablet at 05/25/23 1478   hydrOXYzine  (ATARAX ) tablet 25 mg  25 mg Oral Daily PRN Cox, Amy N, DO   25 mg at 05/22/23 2956   icosapent  Ethyl (VASCEPA ) 1 g capsule 2 g  2 g Oral BID Cox, Amy N, DO   2 g at 05/25/23 2130   insulin  aspart (novoLOG ) injection 0-5 Units  0-5 Units Subcutaneous QHS Cox, Amy N, DO   2 Units at 05/24/23 2241   insulin  aspart (novoLOG ) injection 0-9 Units  0-9 Units Subcutaneous TID WC Cox, Amy N, DO   3 Units at 05/25/23 1016   insulin  aspart (novoLOG ) injection 3 Units  3 Units Subcutaneous TID WC Darus Engels A, DO       insulin  glargine-yfgn (SEMGLEE ) injection 8 Units  8 Units Subcutaneous QHS Darus Engels A, DO   8 Units at 05/24/23 2242   levofloxacin  (LEVAQUIN ) IVPB 750 mg  750 mg Intravenous Q48H Merrill, Kristin A, RPH 100  mL/hr at 05/25/23 1022 750 mg at 05/25/23 1022   lipase/protease/amylase (CREON ) capsule 72,000 Units  72,000 Units Oral TID WC Cox, Amy N, DO   72,000 Units at 05/25/23 1610   nortriptyline  (PAMELOR ) capsule 25 mg  25 mg Oral QHS Cox, Amy N, DO   25 mg at 05/24/23 2242   ondansetron  (ZOFRAN ) tablet 4 mg  4 mg Oral Q6H PRN Cox, Amy N, DO       Or   ondansetron  (ZOFRAN ) injection 4 mg  4 mg Intravenous Q6H PRN Cox, Amy N, DO   4 mg at 05/21/23 2320   Oral care mouth rinse  15 mL Mouth Rinse PRN Cox, Amy N, DO       pantoprazole  (PROTONIX ) EC tablet 80 mg  80 mg Oral Daily Cox, Amy N, DO   80 mg at 05/25/23 0957   polyethylene glycol (MIRALAX  / GLYCOLAX ) packet 17 g  17 g Oral QHS Cox, Amy N, DO   17 g at 05/24/23 2242   senna-docusate (Senokot-S) tablet 1 tablet  1 tablet Oral BID Darus Engels A, DO       sodium chloride  irrigation 0.9 % 3,000 mL  3,000 mL Irrigation Continuous Cox, Amy N, DO   3,000 mL at 05/25/23 0758   zolpidem  (AMBIEN ) tablet 5 mg  5 mg Oral QHS PRN Cox, Amy N, DO   5 mg at 05/23/23 2126    Objective: Vital signs in last 24 hours: Temp:  [98.5 F (36.9 C)-100.5 F (38.1 C)] 98.5 F (36.9 C) (05/20 0843) Pulse Rate:  [89-103] 89 (05/20 0843) Resp:  [16-17] 17 (05/20 0843) BP: (117-126)/(63-75) 120/63 (05/20 0843) SpO2:  [95 %-100 %] 98 % (05/20 0843)  Intake/Output from previous day: 05/19 0701 - 05/20 0700 In: 96045 [P.O.:180] Out: 30650 [Urine:30650] Intake/Output this shift: Total I/O In: 3000 [Other:3000] Out: 3325 [Urine:3325]  Physical Exam Vitals and nursing note reviewed.  Constitutional:      General: She is not in acute distress.    Appearance: She is not ill-appearing, toxic-appearing or diaphoretic.  HENT:     Head: Normocephalic and atraumatic.  Pulmonary:     Effort: Pulmonary effort is normal. No respiratory distress.  Abdominal:     Palpations: Abdomen is soft.  Skin:    General: Skin is warm and dry.  Neurological:     Mental Status: She is alert and oriented to person, place, and time.  Psychiatric:        Mood and Affect: Mood normal.        Behavior: Behavior normal.    Lab Results:  Recent Labs    05/24/23 0531 05/25/23 0418  WBC 8.4 7.5  HGB 10.1* 10.5*  HCT 29.4* 30.1*  PLT 181 221   BMET Recent Labs    05/24/23 0531 05/25/23 0418  NA 133* 137  K 3.8 3.7  CL 103 103  CO2 19* 25  GLUCOSE 215* 192*  BUN 24* 26*  CREATININE 1.99* 1.87*  CALCIUM  8.5* 9.0   Studies/Results: US  PELVIS (TRANSABDOMINAL ONLY) Result Date: 05/23/2023 CLINICAL DATA:  [id number] Hematuria [id number] EXAM: LIMITED ULTRASOUND OF PELVIS TECHNIQUE: Limited transabdominal ultrasound examination of the pelvis was performed. COMPARISON:  May 21, 2023 FINDINGS: Targeted ultrasound was performed of the bladder. Bladder is decompressed around a Foley catheter. The bladder appears thick walled with adjacent heterogeneous material. No definitive internal vascularity is seen within this material on limited provided images. Bladder volume is approximately 73 ML.  IMPRESSION: The bladder  is decompressed around a Foley catheter. The bladder appears thick walled with adjacent heterogeneous material, favored blood products. Recommend correlation with urinalysis. Electronically Signed   By: Clancy Crimes M.D.   On: 05/23/2023 14:12   Assessment & Plan: 68 y.o. female with PMH neurogenic bladder s/p SP tube placement with IR 4 days ago now admitted with gross hematuria and clot retention on CBI and s/p TXA x 2 and 1 unit PRBCs.  Hemoglobin is up today and her gross hematuria has cleared significantly overnight.  She continues to report bladder pressure/discomfort, unclear if this is secondary to new SP tract irritation versus infection versus bladder distention, though I did not appreciate a distended bladder on physical exam.  Will obtain limited pelvic ultrasound to evaluate for bladder distention or retained clot.  Will continue to monitor efflux from Foley catheter.  If her efflux remains pink or clear this afternoon, okay to DC CBI.  Recommendations: -US  pelvis limited today -Clamp CBI, will reassess this afternoon  Kathreen Pare, PA-C 05/25/2023

## 2023-05-25 NOTE — Progress Notes (Signed)
 PHARMACY NOTE:  ANTIMICROBIAL RENAL DOSAGE ADJUSTMENT  Current antimicrobial regimen includes a mismatch between antimicrobial dosage and estimated renal function.  As per policy approved by the Pharmacy & Therapeutics and Medical Executive Committees, the antimicrobial dosage will be adjusted accordingly.  Current antimicrobial dosage:  Levofloxacin  750mg  IV q24h  Indication: ?UTI  Renal Function:  Estimated Creatinine Clearance: 21.3 mL/min (A) (by C-G formula based on SCr of 1.87 mg/dL (H)).     Antimicrobial dosage has been changed to:  Levofloxacin  750mg  IV q48h for Crcl 20 to <50 ml/min  Additional comments:   Thank you for allowing pharmacy to be a part of this patient's care.  Thomasine Flick PharmD Clinical Pharmacist 05/25/2023

## 2023-05-26 DIAGNOSIS — R310 Gross hematuria: Secondary | ICD-10-CM | POA: Diagnosis not present

## 2023-05-26 LAB — GLUCOSE, CAPILLARY
Glucose-Capillary: 142 mg/dL — ABNORMAL HIGH (ref 70–99)
Glucose-Capillary: 184 mg/dL — ABNORMAL HIGH (ref 70–99)
Glucose-Capillary: 107 mg/dL — ABNORMAL HIGH (ref 70–99)
Glucose-Capillary: 233 mg/dL — ABNORMAL HIGH (ref 70–99)

## 2023-05-26 LAB — BASIC METABOLIC PANEL WITH GFR
Anion gap: 7 (ref 5–15)
BUN: 29 mg/dL — ABNORMAL HIGH (ref 8–23)
CO2: 24 mmol/L (ref 22–32)
Calcium: 8.4 mg/dL — ABNORMAL LOW (ref 8.9–10.3)
Chloride: 102 mmol/L (ref 98–111)
Creatinine, Ser: 2.06 mg/dL — ABNORMAL HIGH (ref 0.44–1.00)
GFR, Estimated: 26 mL/min — ABNORMAL LOW (ref >60)
Glucose, Bld: 141 mg/dL — ABNORMAL HIGH (ref 70–99)
Potassium: 3.6 mmol/L (ref 3.5–5.1)
Sodium: 133 mmol/L — ABNORMAL LOW (ref 135–145)

## 2023-05-26 LAB — CBC
HCT: 26.5 % — ABNORMAL LOW (ref 36.0–46.0)
Hemoglobin: 9.2 g/dL — ABNORMAL LOW (ref 12.0–15.0)
MCH: 29.5 pg (ref 26.0–34.0)
MCHC: 34.7 g/dL (ref 30.0–36.0)
MCV: 84.9 fL (ref 80.0–100.0)
Platelets: 223 10*3/uL (ref 150–400)
RBC: 3.12 MIL/uL — ABNORMAL LOW (ref 3.87–5.11)
RDW: 12.1 % (ref 11.5–15.5)
WBC: 9.4 10*3/uL (ref 4.0–10.5)
nRBC: 0 % (ref 0.0–0.2)

## 2023-05-26 MED ORDER — LEVOFLOXACIN IN D5W 500 MG/100ML IV SOLN
500.0000 mg | INTRAVENOUS | Status: DC
  Administered 2023-05-27: 500 mg via INTRAVENOUS
  Filled 2023-05-26 (×2): qty 100

## 2023-05-26 MED ORDER — SODIUM CHLORIDE 0.9 % IV SOLN: INTRAVENOUS | Status: DC

## 2023-05-26 MED ORDER — POLYETHYLENE GLYCOL 3350 17 G PO PACK
34.0000 g | PACK | ORAL | Status: AC
  Administered 2023-05-26 (×3): 34 g via ORAL
  Filled 2023-05-26 (×3): qty 2

## 2023-05-26 NOTE — Progress Notes (Signed)
 On call Provider notified ok to reinsert 3 way catheter and continue CBI at same rate.Patient tolerated procedure well,light red drainage in bag

## 2023-05-26 NOTE — Plan of Care (Signed)

## 2023-05-26 NOTE — Progress Notes (Signed)
 Brief Urology Progress Note  I returned to bedside this afternoon.  Unfortunately with the CBI clamped, the urine is now red.  I irrigated with 1 L of sterile water and was able to obtain a very light pink-yellow urine with 10 cc of old clot material.  Surprisingly, both the three-way Foley and the suprapubic tube irrigated easily.  When I completed irrigation, the urine turned clear red from the SPT.  I restarted the CBI on a slow titration to keep the efflux pink or clearer.  Will reassess in the morning.    I again discussed with her that if her hematuria persists tomorrow, we will need to pursue cystoscopy with fulguration and clot evacuation.  I went ahead and made her n.p.o. after midnight.  Bert Ptacek, PA-C  05/26/23

## 2023-05-26 NOTE — Plan of Care (Signed)
   Problem: Education: Goal: Knowledge of General Education information will improve Description: Including pain rating scale, medication(s)/side effects and non-pharmacologic comfort measures Outcome: Progressing   Problem: Health Behavior/Discharge Planning: Goal: Ability to manage health-related needs will improve Outcome: Progressing   Problem: Clinical Measurements: Goal: Ability to maintain clinical measurements within normal limits will improve Outcome: Progressing Goal: Will remain free from infection Outcome: Progressing Goal: Diagnostic test results will improve Outcome: Progressing Goal: Respiratory complications will improve Outcome: Progressing Goal: Cardiovascular complication will be avoided Outcome: Progressing   Problem: Activity: Goal: Risk for activity intolerance will decrease Outcome: Progressing   Problem: Nutrition: Goal: Adequate nutrition will be maintained Outcome: Progressing   Problem: Coping: Goal: Level of anxiety will decrease Outcome: Progressing   Problem: Elimination: Goal: Will not experience complications related to bowel motility Outcome: Progressing Goal: Will not experience complications related to urinary retention Outcome: Progressing   Problem: Pain Managment: Goal: General experience of comfort will improve and/or be controlled Outcome: Progressing   Problem: Safety: Goal: Ability to remain free from injury will improve Outcome: Progressing   Problem: Skin Integrity: Goal: Risk for impaired skin integrity will decrease Outcome: Progressing   Problem: Education: Goal: Ability to describe self-care measures that may prevent or decrease complications (Diabetes Survival Skills Education) will improve Outcome: Progressing Goal: Individualized Educational Video(s) Outcome: Progressing   Problem: Coping: Goal: Ability to adjust to condition or change in health will improve Outcome: Progressing   Problem: Fluid  Volume: Goal: Ability to maintain a balanced intake and output will improve Outcome: Progressing   Problem: Health Behavior/Discharge Planning: Goal: Ability to identify and utilize available resources and services will improve Outcome: Progressing Goal: Ability to manage health-related needs will improve Outcome: Progressing   Problem: Metabolic: Goal: Ability to maintain appropriate glucose levels will improve Outcome: Progressing   Problem: Nutritional: Goal: Maintenance of adequate nutrition will improve Outcome: Progressing Goal: Progress toward achieving an optimal weight will improve Outcome: Progressing   Problem: Skin Integrity: Goal: Risk for impaired skin integrity will decrease Outcome: Progressing   Problem: Tissue Perfusion: Goal: Adequacy of tissue perfusion will improve Outcome: Progressing   Problem: Education: Goal: Ability to describe self-care measures that may prevent or decrease complications (Diabetes Survival Skills Education) will improve Outcome: Progressing Goal: Individualized Educational Video(s) Outcome: Progressing   Problem: Coping: Goal: Ability to adjust to condition or change in health will improve Outcome: Progressing   Problem: Fluid Volume: Goal: Ability to maintain a balanced intake and output will improve Outcome: Progressing   Problem: Health Behavior/Discharge Planning: Goal: Ability to identify and utilize available resources and services will improve Outcome: Progressing Goal: Ability to manage health-related needs will improve Outcome: Progressing   Problem: Metabolic: Goal: Ability to maintain appropriate glucose levels will improve Outcome: Progressing   Problem: Nutritional: Goal: Maintenance of adequate nutrition will improve Outcome: Progressing Goal: Progress toward achieving an optimal weight will improve Outcome: Progressing   Problem: Skin Integrity: Goal: Risk for impaired skin integrity will  decrease Outcome: Progressing   Problem: Tissue Perfusion: Goal: Adequacy of tissue perfusion will improve Outcome: Progressing

## 2023-05-26 NOTE — Progress Notes (Signed)
 Urology Consult Follow Up  Subjective: Patient accidentally traumatically removed her Foley last night when she got up to the restroom to urinate.  The three-way Foley was replaced by nursing staff.  The patient states that the urine was clear prior to the catheter being traumatically removed.  This morning, the CBI is on moderate drip, but the urine is a very very light translucent pink.   I did turn the CBI off while I spoke and examined the patient and it remained the very very light translucent pink.    There was no output from the SPT.  Her only complaint this morning is constipation.  VSS afebrile   Hemoglobin is down to 9.2 from 10.5 yesterday, but with no hematuria present today this may be dilutional.   Her serum creatinine is up to 2.06 from 1.7 yesterday, but this may be the result of urinary retention during the night.   Anti-infectives: Anti-infectives (From admission, onward)    Start     Dose/Rate Route Frequency Ordered Stop   05/25/23 0930  levofloxacin  (LEVAQUIN ) IVPB 750 mg        750 mg 100 mL/hr over 90 Minutes Intravenous Every 48 hours 05/25/23 0831     05/25/23 0915  levofloxacin  (LEVAQUIN ) IVPB 750 mg  Status:  Discontinued        750 mg 100 mL/hr over 90 Minutes Intravenous Every 24 hours 05/25/23 0824 05/25/23 0831       Current Facility-Administered Medications  Medication Dose Route Frequency Provider Last Rate Last Admin   acetaminophen  (TYLENOL ) tablet 650 mg  650 mg Oral Q6H PRN Cox, Amy N, DO   650 mg at 05/25/23 1253   Or   acetaminophen  (TYLENOL ) suppository 650 mg  650 mg Rectal Q6H PRN Cox, Amy N, DO       amLODipine  (NORVASC ) tablet 10 mg  10 mg Oral Daily Cox, Amy N, DO   10 mg at 05/25/23 9147   ARIPiprazole  (ABILIFY ) tablet 5 mg  5 mg Oral Daily Cox, Amy N, DO   5 mg at 05/24/23 1034   bisacodyl  (DULCOLAX) EC tablet 5 mg  5 mg Oral Daily PRN Darus Engels A, DO   5 mg at 05/25/23 1532   Chlorhexidine  Gluconate Cloth 2 % PADS 6 each  6  each Topical Daily Darus Engels A, DO   6 each at 05/25/23 8295   cyclobenzaprine  (FLEXERIL ) tablet 5 mg  5 mg Oral BID PRN Cox, Amy N, DO   5 mg at 05/25/23 6213   diazepam  (VALIUM ) tablet 2 mg  2 mg Oral Q12H PRN Cox, Amy N, DO   2 mg at 05/26/23 0865   famotidine  (PEPCID ) tablet 20 mg  20 mg Oral QHS Cox, Amy N, DO   20 mg at 05/25/23 2108   feeding supplement (ENSURE ENLIVE / ENSURE PLUS) liquid 237 mL  237 mL Oral BID BM Cox, Amy N, DO   237 mL at 05/25/23 1523   HYDROcodone -acetaminophen  (NORCO/VICODIN) 5-325 MG per tablet 1 tablet  1 tablet Oral Q6H PRN Cox, Amy N, DO   1 tablet at 05/26/23 7846   hydrOXYzine  (ATARAX ) tablet 25 mg  25 mg Oral Daily PRN Cox, Amy N, DO   25 mg at 05/25/23 1532   icosapent  Ethyl (VASCEPA ) 1 g capsule 2 g  2 g Oral BID Cox, Amy N, DO   2 g at 05/25/23 2109   insulin  aspart (novoLOG ) injection 0-5 Units  0-5 Units Subcutaneous QHS Cox,  Amy N, DO   5 Units at 05/25/23 2110   insulin  aspart (novoLOG ) injection 0-9 Units  0-9 Units Subcutaneous TID WC Cox, Amy N, DO   2 Units at 05/25/23 1252   insulin  aspart (novoLOG ) injection 3 Units  3 Units Subcutaneous TID WC Darus Engels A, DO   3 Units at 05/25/23 1252   insulin  glargine-yfgn (SEMGLEE ) injection 8 Units  8 Units Subcutaneous QHS Darus Engels A, DO   8 Units at 05/25/23 2109   levofloxacin  (LEVAQUIN ) IVPB 750 mg  750 mg Intravenous Q48H Merrill, Kristin A, RPH 100 mL/hr at 05/25/23 1022 750 mg at 05/25/23 1022   lipase/protease/amylase (CREON ) capsule 72,000 Units  72,000 Units Oral TID WC Cox, Amy N, DO   72,000 Units at 05/25/23 1731   nortriptyline  (PAMELOR ) capsule 25 mg  25 mg Oral QHS Cox, Amy N, DO   25 mg at 05/25/23 2109   ondansetron  (ZOFRAN ) tablet 4 mg  4 mg Oral Q6H PRN Cox, Amy N, DO       Or   ondansetron  (ZOFRAN ) injection 4 mg  4 mg Intravenous Q6H PRN Cox, Amy N, DO   4 mg at 05/25/23 1532   Oral care mouth rinse  15 mL Mouth Rinse PRN Cox, Amy N, DO       pantoprazole  (PROTONIX )  EC tablet 80 mg  80 mg Oral Daily Cox, Amy N, DO   80 mg at 05/25/23 0957   polyethylene glycol (MIRALAX  / GLYCOLAX ) packet 17 g  17 g Oral QHS Cox, Amy N, DO   17 g at 05/25/23 2108   senna-docusate (Senokot-S) tablet 1 tablet  1 tablet Oral BID Darus Engels A, DO   1 tablet at 05/25/23 2108   sodium chloride  irrigation 0.9 % 3,000 mL  3,000 mL Irrigation Continuous Cox, Amy N, DO 0 mL/hr at 05/25/23 1000 3,000 mL at 05/26/23 0306   zolpidem  (AMBIEN ) tablet 5 mg  5 mg Oral QHS PRN Cox, Amy N, DO   5 mg at 05/25/23 2108     Objective: Vital signs in last 24 hours: Temp:  [98 F (36.7 C)-98.9 F (37.2 C)] 98 F (36.7 C) (05/21 0748) Pulse Rate:  [52-93] 85 (05/21 0748) Resp:  [12-17] 16 (05/21 0748) BP: (106-132)/(60-69) 106/60 (05/21 0748) SpO2:  [93 %-98 %] 98 % (05/21 0748)  Intake/Output from previous day: 05/20 0701 - 05/21 0700 In: 21308 [P.O.:480] Out: 16725 [Urine:16725] Intake/Output this shift: No intake/output data recorded.   Physical Exam Vitals and nursing note reviewed.  Constitutional:      General: She is not in acute distress.    Appearance: Normal appearance. She is normal weight. She is ill-appearing. She is not toxic-appearing or diaphoretic.  HENT:     Head: Normocephalic and atraumatic.     Nose: Nose normal.     Mouth/Throat:     Mouth: Mucous membranes are moist.     Pharynx: Oropharynx is clear.  Eyes:     Extraocular Movements: Extraocular movements intact.     Conjunctiva/sclera: Conjunctivae normal.     Pupils: Pupils are equal, round, and reactive to light.  Pulmonary:     Effort: Pulmonary effort is normal.     Breath sounds: Normal breath sounds.  Abdominal:     General: Abdomen is flat.     Comments: Slight right lower quadrant tenderness, suprapubic tube in place dressing is clean and dry.  No drainage coming from the suprapubic tube.  Musculoskeletal:  General: Normal range of motion.     Cervical back: Normal range of  motion.  Skin:    General: Skin is warm.  Neurological:     General: No focal deficit present.     Mental Status: She is alert and oriented to person, place, and time.  Psychiatric:        Mood and Affect: Mood normal.        Behavior: Behavior normal.        Thought Content: Thought content normal.        Judgment: Judgment normal.     Lab Results:  Recent Labs    05/25/23 0418 05/26/23 0520  WBC 7.5 9.4  HGB 10.5* 9.2*  HCT 30.1* 26.5*  PLT 221 223   BMET Recent Labs    05/25/23 0418 05/26/23 0520  NA 137 133*  K 3.7 3.6  CL 103 102  CO2 25 24  GLUCOSE 192* 141*  BUN 26* 29*  CREATININE 1.87* 2.06*  CALCIUM  9.0 8.4*   PT/INR No results for input(s): "LABPROT", "INR" in the last 72 hours. ABG No results for input(s): "PHART", "HCO3" in the last 72 hours.  Invalid input(s): "PCO2", "PO2"  Studies/Results: US  PELVIS LIMITED (TRANSABDOMINAL ONLY) Result Date: 05/25/2023 CLINICAL DATA:  Gross hematuria EXAM: TRANSABDOMINAL ULTRASOUND OF PELVIS TECHNIQUE: Transabdominal ultrasound examination of the bladder. COMPARISON:  Pelvic ultrasound 05/23/2023 FINDINGS: Foley catheter is seen in the bladder. There is mild diffuse bladder wall thickening and echogenic debris within the bladder. Findings are similar to prior. IMPRESSION: Stable mild diffuse bladder wall thickening and echogenic debris within the bladder. Findings are concerning for cystitis and hemorrhage in the bladder. Electronically Signed   By: Tyron Gallon M.D.   On: 05/25/2023 18:42     Assessment and Plan: 68 year old woman with past medical history neurogenic bladder status post suprapubic tube placement by IR 5 days ago who was admitted for gross hematuria clot retention and CBI and status post TXA x 2 and 1 unit of PRBCs.    Hemoglobin slightly down today, but she has no evidence of gross hematuria, so this may be dilutional.  sSe complains of abdominal discomfort, but she relates this to  constipation.  I did not appreciate a distended bladder on physical exam.  I have clamped the CBI off this morning.  If her reflux remains clear or very light pink, we will discontinue CBI this afternoon.  Her serum creatinine is also up this morning, but this may be due to bladder distention as her Foley was traumatically removed during the night.  Will reassess this afternoon.   LOS: 3 days    [facility] 05/26/2023

## 2023-05-26 NOTE — Progress Notes (Addendum)
 This RN rounded on patient,foley catheter out,urine bag seen hanging on pt's IV pole, pt is on continuous bladder irrigation.CBI saline on floor.Patient  stated she had so much lines while ambulating to the bathroom.Patient educated on the importance of asking for bathroom assistance from staff due to drainage bags.

## 2023-05-26 NOTE — Progress Notes (Signed)
  PROGRESS NOTE    Karen [name]  WUJ:[id number] DOB: July 13, 1955 DOA: 05/21/2023 PCP: Trenda Frisk, FNP  [id number]  LOS: 3 days   Brief hospital course:   Assessment & Plan: Ms. Karen [name] is a 68 year old female with history of insulin -dependent diabetes mellitus, hypertension, hyperlipidemia, insomnia, neurogenic bladder secondary to poorly controlled diabetes mellitus, who presents emergency department for chief concerns of hematuria. " .Aaron AasAaron AasSee H&P for full HPI on admission & ED course.   Of note, patient had undergone placement of a suprapubic catheter on 05/20/23, the day before presenting.  ED provider discussed case with IR.  It was not recommended to attempt replacing the catheter due to immature tract and risk of losing access.  Bladder irrigation from standard Foley was recommended.   * Hematuria Status post three-way Foley catheter placement via urethra with CBI --started on empiric Levaquin  --if hematuria continues, pt may go for cystoscopy tomorrow.  At risk for polypharmacy PDMP reviewed Home Valium  2 mg every 12 hours as needed for muscle spasms, resumed as I do not want patient to withdrawal Zolpidem  10 mg nightly changed to 5 mg nightly for insomnia  Chronic pancreatitis due to chronic alcoholism (HCC) --cont pancrelipase   AKI (acute kidney injury) (HCC) Suspect secondary to obstructive uropathy --cont MIVF@100   Type 2 diabetes mellitus with hyperlipidemia (HCC) --cont glargine 8u nightly --mealtime 3u TID  Hypertension --cont amlodipine  --hold hydrochlorothiazide   Hyponatremia --monitor   DVT prophylaxis: SCD/Compression stockings Code Status: Full code  Family Communication:  Level of care: Med-Surg Dispo:   The patient is from: home Anticipated d/c is to: home Anticipated d/c date is: 2-3 days   Subjective and Interval History:  Pt reported some discomfort in her bladder.  Still having hematuria, bladder irrigation  continued.   Objective: Vitals:   05/25/23 2014 05/26/23 0330 05/26/23 0748 05/26/23 1412  BP: 116/63 132/69 106/60 98/66  Pulse: (!) 52 93 85 92  Resp: 12 16 16 16   Temp: 98.9 F (37.2 C) 98.9 F (37.2 C) 98 F (36.7 C) 98.3 F (36.8 C)  TempSrc: Oral Oral  Oral  SpO2: 93% 98% 98% 95%  Weight:      Height:        Intake/Output Summary (Last 24 hours) at 05/26/2023 2046 Last data filed at 05/26/2023 1900 Gross per 24 hour  Intake 6300 ml  Output 8925 ml  Net -2625 ml   Filed Weights   05/21/23 0814 05/21/23 1709  Weight: 44.9 kg 45.6 kg    Examination:   Constitutional: NAD, AAOx3 HEENT: conjunctivae and lids normal, EOMI CV: No cyanosis.   RESP: normal respiratory effort, on RA Neuro: II - XII grossly intact.   Psych: Normal mood and affect.  Appropriate judgement and reason Suprapubic cath and Foley with irrigation, light red urine.   Data Reviewed: I have personally reviewed labs and imaging studies  Time spent: 50 minutes  Garrison Kanner, MD [facility] If 7PM-7AM, please contact night-coverage 05/26/2023, 8:46 PM

## 2023-05-27 ENCOUNTER — Inpatient Hospital Stay: Admitting: Anesthesiology

## 2023-05-27 ENCOUNTER — Encounter
Admission: EM | Disposition: A | Discharge status: Home / Self Care | Payer: Self-pay | Source: Home / Self Care | Attending: Internal Medicine

## 2023-05-27 ENCOUNTER — Encounter: Payer: Self-pay | Admitting: Internal Medicine

## 2023-05-27 ENCOUNTER — Other Ambulatory Visit: Payer: Self-pay

## 2023-05-27 DIAGNOSIS — R310 Gross hematuria: Secondary | ICD-10-CM | POA: Diagnosis not present

## 2023-05-27 DIAGNOSIS — N3289 Other specified disorders of bladder: Secondary | ICD-10-CM | POA: Diagnosis not present

## 2023-05-27 DIAGNOSIS — N319 Neuromuscular dysfunction of bladder, unspecified: Secondary | ICD-10-CM | POA: Diagnosis not present

## 2023-05-27 HISTORY — PX: CYSTOSCOPY WITH FULGERATION: SHX6638

## 2023-05-27 LAB — BASIC METABOLIC PANEL WITH GFR
Anion gap: 8 (ref 5–15)
BUN: 29 mg/dL — ABNORMAL HIGH (ref 8–23)
CO2: 25 mmol/L (ref 22–32)
Calcium: 8.6 mg/dL — ABNORMAL LOW (ref 8.9–10.3)
Chloride: 106 mmol/L (ref 98–111)
Creatinine, Ser: 1.94 mg/dL — ABNORMAL HIGH (ref 0.44–1.00)
GFR, Estimated: 28 mL/min — ABNORMAL LOW (ref >60)
Glucose, Bld: 135 mg/dL — ABNORMAL HIGH (ref 70–99)
Potassium: 3.2 mmol/L — ABNORMAL LOW (ref 3.5–5.1)
Sodium: 139 mmol/L (ref 135–145)

## 2023-05-27 LAB — CBC
HCT: 29.1 % — ABNORMAL LOW (ref 36.0–46.0)
Hemoglobin: 9.8 g/dL — ABNORMAL LOW (ref 12.0–15.0)
MCH: 29.5 pg (ref 26.0–34.0)
MCHC: 33.7 g/dL (ref 30.0–36.0)
MCV: 87.7 fL (ref 80.0–100.0)
Platelets: 306 10*3/uL (ref 150–400)
RBC: 3.32 MIL/uL — ABNORMAL LOW (ref 3.87–5.11)
RDW: 12.6 % (ref 11.5–15.5)
WBC: 9.8 10*3/uL (ref 4.0–10.5)
nRBC: 0 % (ref 0.0–0.2)

## 2023-05-27 LAB — GLUCOSE, CAPILLARY
Glucose-Capillary: 141 mg/dL — ABNORMAL HIGH (ref 70–99)
Glucose-Capillary: 78 mg/dL (ref 70–99)
Glucose-Capillary: 74 mg/dL (ref 70–99)
Glucose-Capillary: 212 mg/dL — ABNORMAL HIGH (ref 70–99)
Glucose-Capillary: 203 mg/dL — ABNORMAL HIGH (ref 70–99)
Glucose-Capillary: 210 mg/dL — ABNORMAL HIGH (ref 70–99)

## 2023-05-27 LAB — MAGNESIUM: Magnesium: 2.2 mg/dL (ref 1.7–2.4)

## 2023-05-27 SURGERY — CYSTOSCOPY, WITH BLADDER FULGURATION
Anesthesia: General

## 2023-05-27 MED ORDER — PROPOFOL 10 MG/ML IV BOLUS
INTRAVENOUS | Status: AC
  Filled 2023-05-27: qty 20

## 2023-05-27 MED ORDER — DEXTROSE 50 % IV SOLN
25.0000 g | Freq: Once | INTRAVENOUS | Status: AC
  Administered 2023-05-27: 25 g via INTRAVENOUS

## 2023-05-27 MED ORDER — MIDAZOLAM HCL 2 MG/2ML IJ SOLN
INTRAMUSCULAR | Status: AC
  Filled 2023-05-27: qty 2

## 2023-05-27 MED ORDER — ONDANSETRON HCL 4 MG/2ML IJ SOLN
INTRAMUSCULAR | Status: DC | PRN
  Administered 2023-05-27: 4 mg via INTRAVENOUS

## 2023-05-27 MED ORDER — SODIUM CHLORIDE 0.9 % IR SOLN
Status: DC | PRN
Start: 2023-05-27 — End: 2023-05-27
  Administered 2023-05-27: 6000 mL

## 2023-05-27 MED ORDER — SODIUM CHLORIDE 0.9 % IR SOLN
Status: DC | PRN
  Administered 2023-05-27: 1000 mL

## 2023-05-27 MED ORDER — LACTATED RINGERS IV SOLN: INTRAVENOUS | Status: DC

## 2023-05-27 MED ORDER — ENSURE ENLIVE PO LIQD: 237.0000 mL | Freq: Two times a day (BID) | ORAL | Status: AC

## 2023-05-27 MED ORDER — EPHEDRINE SULFATE-NACL 50-0.9 MG/10ML-% IV SOSY
PREFILLED_SYRINGE | INTRAVENOUS | Status: DC | PRN
Start: 2023-05-27 — End: 2023-05-27
  Administered 2023-05-27: 10 mg via INTRAVENOUS
  Administered 2023-05-27 (×2): 5 mg via INTRAVENOUS

## 2023-05-27 MED ORDER — ACETAMINOPHEN 10 MG/ML IV SOLN: 15.0000 mg/kg | Freq: Once | INTRAVENOUS | Status: DC | PRN

## 2023-05-27 MED ORDER — DEXAMETHASONE SODIUM PHOSPHATE 10 MG/ML IJ SOLN
INTRAMUSCULAR | Status: DC | PRN
  Administered 2023-05-27: 4 mg via INTRAVENOUS

## 2023-05-27 MED ORDER — LEVOFLOXACIN 500 MG PO TABS
500.0000 mg | ORAL_TABLET | Freq: Once | ORAL | 0 refills | Status: AC
  Filled 2023-05-27: qty 1, 1d supply, fill #0

## 2023-05-27 MED ORDER — PROPOFOL 10 MG/ML IV BOLUS
INTRAVENOUS | Status: DC | PRN
  Administered 2023-05-27: 100 mg via INTRAVENOUS

## 2023-05-27 MED ORDER — DEXTROSE 50 % IV SOLN
INTRAVENOUS | Status: AC
  Filled 2023-05-27: qty 50

## 2023-05-27 MED ORDER — MIDAZOLAM HCL 2 MG/2ML IJ SOLN
INTRAMUSCULAR | Status: DC | PRN
  Administered 2023-05-27: 1 mg via INTRAVENOUS

## 2023-05-27 MED ORDER — LIDOCAINE HCL (CARDIAC) PF 100 MG/5ML IV SOSY
PREFILLED_SYRINGE | INTRAVENOUS | Status: DC | PRN
  Administered 2023-05-27: 40 mg via INTRAVENOUS

## 2023-05-27 MED ORDER — ACETAMINOPHEN 10 MG/ML IV SOLN
INTRAVENOUS | Status: AC
  Filled 2023-05-27: qty 100

## 2023-05-27 MED ORDER — FENTANYL CITRATE (PF) 100 MCG/2ML IJ SOLN
INTRAMUSCULAR | Status: AC
  Filled 2023-05-27: qty 2

## 2023-05-27 MED ORDER — LACTATED RINGERS IV SOLN: INTRAVENOUS | Status: DC | PRN

## 2023-05-27 MED ORDER — FENTANYL CITRATE (PF) 100 MCG/2ML IJ SOLN
INTRAMUSCULAR | Status: DC | PRN
  Administered 2023-05-27: 25 ug via INTRAVENOUS

## 2023-05-27 MED ORDER — LIDOCAINE HCL (PF) 2 % IJ SOLN
INTRAMUSCULAR | Status: AC
  Filled 2023-05-27: qty 5

## 2023-05-27 MED ORDER — ONDANSETRON HCL 4 MG/2ML IJ SOLN: 4.0000 mg | Freq: Once | INTRAMUSCULAR | Status: DC | PRN

## 2023-05-27 MED ORDER — EPHEDRINE 5 MG/ML INJ
INTRAVENOUS | Status: AC
  Filled 2023-05-27: qty 5

## 2023-05-27 SURGICAL SUPPLY — 17 items
BAG DRAIN SIEMENS DORNER NS (MISCELLANEOUS) ×1 IMPLANT
BAG URINE DRAIN 2000ML AR STRL (UROLOGICAL SUPPLIES) ×1 IMPLANT
CATH FOL 2WAY LX 18X30 (CATHETERS) ×1 IMPLANT
ELECTRODE LOOP 22F BIPOLAR SML (ELECTROSURGICAL) IMPLANT
ELECTRODE REM PT RTRN 9FT ADLT (ELECTROSURGICAL) ×1 IMPLANT
GLOVE BIOGEL PI IND STRL 7.5 (GLOVE) ×1 IMPLANT
GOWN STRL REUS W/ TWL LRG LVL3 (GOWN DISPOSABLE) ×1 IMPLANT
GOWN STRL REUS W/ TWL XL LVL3 (GOWN DISPOSABLE) ×1 IMPLANT
GUIDEWIRE STR DUAL SENSOR (WIRE) IMPLANT
PACK CYSTO AR (MISCELLANEOUS) ×1 IMPLANT
PLUG CATH AND CAP STRL 200 (CATHETERS) IMPLANT
SET CYSTO W/LG BORE CLAMP LF (SET/KITS/TRAYS/PACK) ×1 IMPLANT
SOL .9 NS 3000ML IRR UROMATIC (IV SOLUTION) ×1 IMPLANT
SYRINGE TOOMEY IRRIG 70ML (MISCELLANEOUS) ×1 IMPLANT
WATER STERILE IRR 1000ML POUR (IV SOLUTION) ×1 IMPLANT
WATER STERILE IRR 3000ML UROMA (IV SOLUTION) ×1 IMPLANT
WATER STERILE IRR 500ML POUR (IV SOLUTION) ×1 IMPLANT

## 2023-05-27 NOTE — Anesthesia Postprocedure Evaluation (Signed)
 Anesthesia Post Note  Patient: Mariam Shingles  Procedure(s) Performed: CYSTOSCOPY, WITH BLADDER FULGURATION and clot evacuation  Patient location during evaluation: PACU Anesthesia Type: General Level of consciousness: awake and alert, oriented and patient cooperative Pain management: pain level controlled Vital Signs Assessment: post-procedure vital signs reviewed and stable Respiratory status: spontaneous breathing, nonlabored ventilation and respiratory function stable Cardiovascular status: blood pressure returned to baseline and stable Postop Assessment: adequate PO intake Anesthetic complications: no   No notable events documented.   Last Vitals:  Vitals:   05/27/23 1231 05/27/23 1300  BP: (!) 124/56 118/83  Pulse: 85 86  Resp: 16 (!) 22  Temp: 36.6 C 37.1 C  SpO2: 97% 96%    Last Pain:  Vitals:   05/27/23 1231  TempSrc:   PainSc: 0-No pain                 Dorothey Gate

## 2023-05-27 NOTE — Anesthesia Procedure Notes (Signed)
 Procedure Name: LMA Insertion Date/Time: 05/27/2023 11:53 AM  Performed by: Wilkins Hardy I, CRNAPre-anesthesia Checklist: Patient identified, Patient being monitored, Timeout performed, Emergency Drugs available and Suction available Patient Re-evaluated:Patient Re-evaluated prior to induction Oxygen Delivery Method: Circle system utilized Preoxygenation: Pre-oxygenation with 100% oxygen Induction Type: IV induction Ventilation: Mask ventilation without difficulty LMA: LMA inserted LMA Size: 3.0 Tube type: Oral Number of attempts: 1 Placement Confirmation: positive ETCO2 and breath sounds checked- equal and bilateral Tube secured with: Tape Dental Injury: Teeth and Oropharynx as per pre-operative assessment

## 2023-05-27 NOTE — Op Note (Signed)
 Date of procedure: 05/27/23  Preoperative diagnosis:  Gross hematuria  Postoperative diagnosis:  Same  Procedure: Cystoscopy, clot evacuation, fulguration  Surgeon: Jay Meth, MD  Anesthesia: General  Complications: None  Intraoperative findings:  Ureteral orifices orthotopic bilaterally, no suspicious bladder lesions 40cc old clot evacuated, small scab at [facility] tube entry site with mild oozing, fulgurated All clot evacuated free, urine clear at conclusion of procedure  EBL: Minimal  Specimens: None  Drains: 16 French SP tube  Indication: Karen [name] is a 68 y.o. patient with neurogenic bladder who opted for suprapubic tube placement, which was placed by interventional radiology on 05/20/2023.  She was admitted the next day with gross hematuria and clot retention and started on CBI.  She has failed multiple clamping trials on CBI, and required blood transfusion, opted for OR for cystoscopy, clot evacuation, fulguration.    After reviewing the management options for treatment, they elected to proceed with the above surgical procedure(s). We have discussed the potential benefits and risks of the procedure, side effects of the proposed treatment, the likelihood of the patient achieving the goals of the procedure, and any potential problems that might occur during the procedure or recuperation. Informed consent has been obtained.  Description of procedure:  The patient was taken to the operating room and general anesthesia was induced. SCDs were placed for DVT prophylaxis. The patient was placed in the dorsal lithotomy position, prepped and draped in the usual sterile fashion, and preoperative antibiotics(Levaquin ) were administered. A preoperative time-out was performed.   The 16 French suprapubic tube was capped. A 26 French resectoscope was inserted into the bladder with the visual obturator and thorough cystoscopy was performed.  Bladder was grossly normal-appearing with ureteral  orifices orthotopic bilaterally, no suspicious lesions.  The suprapubic tube was clearly seen entering from the dome of the bladder with balloon in place.  There was approximately 40 cc of old clot that was able to be irrigated free.  There was some additional clot affixed to the suprapubic tube entry site at the dome.  Using the bipolar resectoscope this was gently scraped free.  There was some very mild oozing at the suprapubic site which was fulgurated.  Thorough cystoscopy showed no other evidence of bleeding.  There was some very minimal erythema at the posterior bladder wall which was fulgurated in an abundance of caution.  With the bladder decompressed no bleeding was noted, all clot had been evacuated.  The suprapubic tube was irrigated and remained crystal-clear.  The suprapubic tube was connected to drainage  Disposition: Stable to PACU  Plan: Okay for discharge from urology perspective, will coordinate follow-up in 3 weeks in urology clinic for suprapubic tube change  Jay Meth, MD

## 2023-05-27 NOTE — Anesthesia Preprocedure Evaluation (Addendum)
 Anesthesia Evaluation  Patient identified by MRN, date of birth, ID band Patient awake    Reviewed: Allergy & Precautions, NPO status , Patient's Chart, lab work & pertinent test results  History of Anesthesia Complications Negative for: history of anesthetic complications  Airway Mallampati: II   Neck ROM: Full    Dental  (+) Edentulous Upper, Edentulous Lower   Pulmonary asthma , former smoker (quit 10 years ago)   Pulmonary exam normal breath sounds clear to auscultation       Cardiovascular hypertension, Normal cardiovascular exam Rhythm:Regular Rate:Normal  ECG 04/05/23:  Sinus rhythm Borderline T abnormalities, anterior leads   Neuro/Psych  Headaches PSYCHIATRIC DISORDERS  Depression    Alcohol use disorder, patient denies    GI/Hepatic negative GI ROS,,,  Endo/Other  diabetes, Type 2    Renal/GU Renal disease (stage IV CKD)     Musculoskeletal   Abdominal   Peds  Hematology negative hematology ROS (+)   Anesthesia Other Findings Last dose of Mounjaro  05/19/23.  Reproductive/Obstetrics                             Anesthesia Physical Anesthesia Plan  ASA: 3  Anesthesia Plan: General   Post-op Pain Management:    Induction: Intravenous  PONV Risk Score and Plan: 3 and Treatment may vary due to age or medical condition and Ondansetron   Airway Management Planned: LMA  Additional Equipment:   Intra-op Plan:   Post-operative Plan: Extubation in OR  Informed Consent: I have reviewed the patients History and Physical, chart, labs and discussed the procedure including the risks, benefits and alternatives for the proposed anesthesia with the patient or authorized representative who has indicated his/her understanding and acceptance.       Plan Discussed with: CRNA  Anesthesia Plan Comments: (Patient consented for risks of anesthesia including but not limited to:  - adverse  reactions to medications - damage to eyes, teeth, lips or other oral mucosa - nerve damage due to positioning  - sore throat or hoarseness - damage to heart, brain, nerves, lungs, other parts of body or loss of life  Informed patient about role of CRNA in peri- and intra-operative care.  Patient voiced understanding.)        Anesthesia Quick Evaluation

## 2023-05-27 NOTE — Discharge Instructions (Signed)

## 2023-05-27 NOTE — Plan of Care (Signed)
   Problem: Education: Goal: Knowledge of General Education information will improve Description Including pain rating scale, medication(s)/side effects and non-pharmacologic comfort measures Outcome: Progressing

## 2023-05-27 NOTE — Discharge Summary (Signed)
 Physician Discharge Summary   Karen [name]  female DOB: [birth year]  QMV:[id number]  PCP: Trenda Frisk, FNP  Admit date: 05/21/2023 Discharge date: 05/27/2023  Admitted From: home Disposition:  home CODE STATUS: Full code  Discharge Instructions     Diet Carb Modified   Complete by: As directed       Hospital Course:  For full details, please see H&P, progress notes, consult notes and ancillary notes.  Briefly,  Karen [name] is a 68 year old female with history of insulin -dependent diabetes mellitus, hypertension, neurogenic bladder secondary to poorly controlled diabetes mellitus, who presented emergency department for chief concerns of hematuria.   Of note, patient had undergone placement of a suprapubic catheter on 05/20/23, the day before presenting.  ED provider discussed case with IR.  It was not recommended to attempt replacing the catheter due to immature tract and risk of losing access.  Bladder irrigation from standard Foley was recommended.   * Hematuria Status post three-way Foley catheter placement via urethra with CBI, however, hematuria persisted. --Pt therefore underwent cystoscopy on 5/22 with all clots evacuated, no suspicious bladder lesions.  Urine clear at conclusion of procedure.  Bleeding was believed to be from the suprapubic insertion site. --pt was discharged on Levaquin  to finish a 5-day empiric course, per urology rec.   Chronic pancreatitis due to chronic alcoholism (HCC) --cont pancrelipase    AKI (acute kidney injury) (HCC) Suspect secondary to obstructive uropathy --Cr 2.54 on presentation, improved with relief of obstruction and IVF.  Cr 1.94 prior to discharge.   Type 2 diabetes mellitus with hyperlipidemia (HCC) --discharged back on home regimen.   Hypertension --cont amlodipine  --hold hydrochlorothiazide  due to low normal BP   Hyponatremia --Na intermittently in low 130's.   Unless noted above, medications under "STOP" list  are ones pt was not taking PTA.  Discharge Diagnoses:  Principal Problem:   Hematuria Active Problems:   Hypertension   HLD (hyperlipidemia)   Type 2 diabetes mellitus with hyperlipidemia (HCC)   AKI (acute kidney injury) (HCC)   Type II diabetes mellitus with renal manifestations (HCC)   Diabetic polyneuropathy associated with type 2 diabetes mellitus (HCC)   Urinary retention   Therapeutic opioid induced constipation   Chronic pancreatitis due to chronic alcoholism (HCC)   Hyponatremia   At risk for polypharmacy   30 Day Unplanned Readmission Risk Score    Flowsheet Row ED to Hosp-Admission (Current) from 05/21/2023 in [facility]  30 Day Unplanned Readmission Risk Score (%) 26.1 Filed at 05/27/2023 1200       This score is the patient's risk of an unplanned readmission within 30 days of being discharged (0 -100%). The score is based on dignosis, age, lab data, medications, orders, and past utilization.   Low:  0-14.9   Medium: 15-21.9   High: 22-29.9   Extreme: 30 and above         Discharge Instructions:  Allergies as of 05/27/2023       Reactions   Metoclopramide  Other (See Comments), Shortness Of Breath   Hypotension, shortness of breath Hypotension, shortness of breath  Hypotension, shortness of breath Hypotension, shortness of breath    Other Reaction(s): Other (See Comments)    Hypotension, shortness of breath  Hypotension, shortness of breath Hypotension, shortness of breath Shortness of Breath, Hypotension   Penicillins Anaphylaxis, Rash, Swelling   Has patient had a PCN reaction causing immediate rash, facial/tongue/throat swelling, SOB or lightheadedness with hypotension: Yes Has  patient had a PCN reaction causing severe rash involving mucus membranes or skin necrosis: No Has patient had a PCN reaction that required hospitalization No Has patient had a PCN reaction occurring within the last 10 years: No If all of  the above answers are "NO", then may proceed with Cephalosporin use. Has patient had a PCN reaction causing immediate rash, facial/tongue/throat swelling, SOB or lightheadedness with hypotension: Yes  Has patient had a PCN reaction causing severe rash involving mucus membranes or skin necrosis: No  Has patient had a PCN reaction that required hospitalization No  Has patient had a PCN reaction occurring within the last 10 years: No  If all of the above answers are "NO", then may proceed with Cephalosporin use. Has patient had a PCN reaction causing immediate rash, facial/tongue/throat swelling, SOB or lightheadedness with hypotension: Yes  Has patient had a PCN reaction causing severe rash involving mucus membranes or skin necrosis: No  Has patient had a PCN reaction that required hospitalization No  Has patient had a PCN reaction occurring within the last 10 years: No  If all of the above answers are "NO", then may proceed with Cephalosporin use.  Has patient had a PCN reaction causing immediate rash, facial/tongue/throat swelling, SOB or lightheadedness with hypotension: Yes Has patient had a PCN reaction causing severe rash involving mucus membranes or skin necrosis: No Has patient had a PCN reaction that required hospitalization No Has patient had a PCN reaction occurring within the last 10 years: No If all of the above answers are "NO", then may proceed with Cephalosporin use.   Fentanyl  Rash   Rash   Other Rash   Bee sting  Bee sting Bee sting    Bee sting   Bee sting   Oxycodone -acetaminophen  Rash   plain tylenol  can also make itch? plain tylenol  can also make itch?  plain tylenol  can also make itch? plain tylenol  can also make itch?    plain tylenol  can also make itch?  plain tylenol  can also make itch? plain tylenol  can also make itch?   Percocet [oxycodone -acetaminophen ] Rash   plain tylenol  can also make itch?        Medication List     PAUSE taking these medications     hydrochlorothiazide  12.5 MG tablet Wait to take this until your doctor or other care provider tells you to start again. Due to low normal blood pressure. Commonly known as: HYDRODIURIL  TAKE 1 TABLET BY MOUTH ONCE DAILY FOR SWELLING IN LEGS       STOP taking these medications    ondansetron  4 MG disintegrating tablet Commonly known as: Zofran  ODT   Polyethylene Glycol 3350  Powd   pregabalin  200 MG capsule Commonly known as: LYRICA    triamcinolone  cream 0.5 % Commonly known as: KENALOG        TAKE these medications    amLODipine  10 MG tablet Commonly known as: NORVASC  Take 1 tablet (10 mg total) by mouth daily. for blood pressure   ARIPiprazole  5 MG tablet Commonly known as: ABILIFY  Take 1 tablet (5 mg total) by mouth daily.   cetirizine  5 MG tablet Commonly known as: ZYRTEC  Take 1 tablet (5 mg total) by mouth daily.   cyclobenzaprine  5 MG tablet Commonly known as: FLEXERIL  TAKE 1 TABLET BY MOUTH UP TO TWICE A DAYAS NEEDED FOR NECK AND BACK PAIN   diazepam  2 MG tablet Commonly known as: VALIUM  TAKE 1 TABLET BY MOUTH EVERY 12 HOURS ASNEEDED FOR MUSCLE SPASMS   docusate sodium   100 MG capsule Commonly known as: COLACE TAKE 1 CAPSULE BY MOUTH TWICE DAILY   empagliflozin 25 MG Tabs tablet Commonly known as: JARDIANCE Take 25 mg by mouth daily.   famotidine  20 MG tablet Commonly known as: PEPCID  TAKE 1 TABLET BY MOUTH TWICE DAILY   feeding supplement Liqd Take 237 mLs by mouth 2 (two) times daily between meals. Start taking on: May 28, 2023   HYDROcodone -acetaminophen  5-325 MG tablet Commonly known as: NORCO/VICODIN TAKE 1 TABLET BY MOUTH 4 TIMES DAILY AS NEEDED FOR PAIN   hydrOXYzine  25 MG tablet Commonly known as: ATARAX  TAKE 1 TABLET BY MOUTH TWICE DAILY AS NEEDED UNTIL RASH GOES AWAY   Insulin  Pen Needle 29G X Misc 1 Dose by Does not apply route 3 (three) times daily before meals.   levofloxacin  500 MG tablet Commonly known as:  LEVAQUIN  Take 1 tablet (500 mg total) by mouth once for 1 dose. Take on 05/29/23. Start taking on: May 29, 2023   Mounjaro  5 MG/0.5ML Pen Generic drug: tirzepatide  Inject 5 mg into the skin once a week.   Nexlizet 180-10 MG Tabs Generic drug: Bempedoic Acid-Ezetimibe TAKE 1 TABLET BY MOUTH AT BEDTIME FOR CHOLESTEROL   nortriptyline  25 MG capsule Commonly known as: PAMELOR  TAKE 1 CAPSULE BY MOUTH AT BEDTIME   NovoLOG  Mix 70/30 FlexPen (70-30) 100 UNIT/ML FlexPen Generic drug: insulin  aspart protamine - aspart INJECT 15 UNITS SUBCUTANEOUSLY ONCE EVERY MORNING AS DIRECTED *STOP OTHER INSULINS   omeprazole  40 MG capsule Commonly known as: PRILOSEC TAKE 1 CAPSULE BY MOUTH TWICE DAILY FOR REFULX   polyethylene glycol powder 17 GM/SCOOP powder Commonly known as: GLYCOLAX /MIRALAX  MIX 17 gms (1 CAPFUL) IN 8oz OF WATER OR JUICE AND DRINK ONCE DAILY.   rosuvastatin  20 MG tablet Commonly known as: CRESTOR  TAKE 1 TABLET BY MOUTH ONCE EVERY EVENING   Vascepa  0.5 g Caps Generic drug: Icosapent  Ethyl Take 4 capsules by mouth in the morning and at bedtime.   Zenpep  40000-126000 units Cpep Generic drug: Pancrelipase  (Lip-Prot-Amyl) TAKE 2 CAPSULES BY MOUTH WITH MEALS AND 1 WITH SNACKS   zolpidem  10 MG tablet Commonly known as: AMBIEN  TAKE 1 TABLET BY MOUTH AT BEDTIME AS NEEDED FOR INSOMNIA         Follow-up Information     Lawerence Pressman, MD Follow up in 3 week(s).   Specialty: Urology Why: suprapubic tube change Contact information: [address] [place] [address] [phone]         Trenda Frisk, FNP Follow up in 1 week(s).   Specialty: Family Medicine Contact information: [address] [place] [address] [phone]                 Allergies  Allergen Reactions   Metoclopramide  Other (See Comments) and Shortness Of Breath    Hypotension, shortness of breath  Hypotension, shortness of breath  Hypotension, shortness of  breath  Hypotension, shortness of breath    Other Reaction(s): Other (See Comments)    Hypotension, shortness of breath  Hypotension, shortness of breath Hypotension, shortness of breath  Shortness of Breath, Hypotension   Penicillins Anaphylaxis, Rash and Swelling    Has patient had a PCN reaction causing immediate rash, facial/tongue/throat swelling, SOB or lightheadedness with hypotension: Yes  Has patient had a PCN reaction causing severe rash involving mucus membranes or skin necrosis: No  Has patient had a PCN reaction that required hospitalization No  Has patient had a PCN reaction occurring within the last 10  years: No  If all of the above answers are "NO", then may proceed with Cephalosporin use.  Has patient had a PCN reaction causing immediate rash, facial/tongue/throat swelling, SOB or lightheadedness with hypotension: Yes  Has patient had a PCN reaction causing severe rash involving mucus membranes or skin necrosis: No  Has patient had a PCN reaction that required hospitalization No  Has patient had a PCN reaction occurring within the last 10 years: No  If all of the above answers are "NO", then may proceed with Cephalosporin use.  Has patient had a PCN reaction causing immediate rash, facial/tongue/throat swelling, SOB or lightheadedness with hypotension: Yes  Has patient had a PCN reaction causing severe rash involving mucus membranes or skin necrosis: No  Has patient had a PCN reaction that required hospitalization No  Has patient had a PCN reaction occurring within the last 10 years: No  If all of the above answers are "NO", then may proceed with Cephalosporin use.  Has patient had a PCN reaction causing immediate rash, facial/tongue/throat swelling, SOB or lightheadedness with hypotension: Yes Has patient had a PCN reaction causing severe rash involving mucus membranes or skin necrosis: No Has patient had a PCN reaction that required hospitalization No Has patient had a  PCN reaction occurring within the last 10 years: No If all of the above answers are "NO", then may proceed with Cephalosporin use.   Fentanyl  Rash    Rash   Other Rash    Bee sting   Bee sting  Bee sting    Bee sting   Bee sting   Oxycodone -Acetaminophen  Rash    plain tylenol  can also make itch?  plain tylenol  can also make itch?  plain tylenol  can also make itch?  plain tylenol  can also make itch?    plain tylenol  can also make itch?  plain tylenol  can also make itch? plain tylenol  can also make itch?   Percocet [Oxycodone -Acetaminophen ] Rash    plain tylenol  can also make itch?     The results of significant diagnostics from this hospitalization (including imaging, microbiology, ancillary and laboratory) are listed below for reference.   Consultations:   Procedures/Studies: US  PELVIS LIMITED (TRANSABDOMINAL ONLY) Result Date: 05/25/2023 CLINICAL DATA:  Gross hematuria EXAM: TRANSABDOMINAL ULTRASOUND OF PELVIS TECHNIQUE: Transabdominal ultrasound examination of the bladder. COMPARISON:  Pelvic ultrasound 05/23/2023 FINDINGS: Foley catheter is seen in the bladder. There is mild diffuse bladder wall thickening and echogenic debris within the bladder. Findings are similar to prior. IMPRESSION: Stable mild diffuse bladder wall thickening and echogenic debris within the bladder. Findings are concerning for cystitis and hemorrhage in the bladder. Electronically Signed   By: Tyron Gallon M.D.   On: 05/25/2023 18:42   US  PELVIS LIMITED (TRANSABDOMINAL ONLY) Result Date: 05/23/2023 CLINICAL DATA:  [id number] Hematuria [id number] EXAM: LIMITED ULTRASOUND OF PELVIS TECHNIQUE: Limited transabdominal ultrasound examination of the pelvis was performed. COMPARISON:  May 21, 2023 FINDINGS: Targeted ultrasound was performed of the bladder. Bladder is decompressed around a Foley catheter. The bladder appears thick walled with adjacent heterogeneous material. No definitive internal vascularity is seen  within this material on limited provided images. Bladder volume is approximately 73 ML. IMPRESSION: The bladder is decompressed around a Foley catheter. The bladder appears thick walled with adjacent heterogeneous material, favored blood products. Recommend correlation with urinalysis. Electronically Signed   By: Clancy Crimes M.D.   On: 05/23/2023 14:12   CT PELVIS WO CONTRAST Result Date: 05/21/2023 CLINICAL DATA:  Hematuria with dizziness and weakness  EXAM: CT PELVIS WITHOUT CONTRAST TECHNIQUE: Multidetector CT imaging of the pelvis was performed following the standard protocol without intravenous contrast. RADIATION DOSE REDUCTION: This exam was performed according to the departmental dose-optimization program which includes automated exposure control, adjustment of the mA and/or kV according to patient size and/or use of iterative reconstruction technique. COMPARISON:  CT abdomen and pelvis dated 04/28/2023 FINDINGS: Urinary Tract: Interval placement of a suprapubic catheter. Urinary bladder is distended and contains large volume irregular hyperdensity measuring 7.7 x 6.2 x 7.4 cm (2:29, 5:47). Small volume intraluminal gas. Bowel: Partially imaged bowel without bowel wall thickening, distention, or inflammatory changes. Colonic diverticulosis without acute diverticulitis. Appendix is not discretely seen. Vascular/Lymphatic: Aortic atherosclerosis. No enlarged abdominal or pelvic lymph nodes. Reproductive: No adnexal masses. Other: No free fluid, fluid collection, or free air. Musculoskeletal: No acute or abnormal lytic or blastic osseous lesions. 1.7 cm lipoma within the right iliopsoas (2:34). IMPRESSION: 1. Interval placement of a suprapubic catheter. Urinary bladder is distended and contains large volume irregular hyperdensity, likely hematoma. 2. Colonic diverticulosis without acute diverticulitis. 3.  Aortic Atherosclerosis (ICD10-I70.0). Electronically Signed   By: Limin  Xu M.D.   On: 05/21/2023  10:32   IR CYSTOSTOMY TUBE PLACEMENT/BLADDER ASPIRATION Result Date: 05/20/2023 INDICATION: 68 year old female with neurogenic bladder referred for suprapubic catheter placement EXAM: URINARY BLADDER ASPIRATION W/ NEEDLE COMPARISON:  04/28/2023 MEDICATIONS: None ANESTHESIA/SEDATION: 1.0 mg Versed , 1.0 mg Dilaudid  4 mg IV Zofran  15 mg IV Toradol  Moderate Sedation Time:  10 minutes The patient was continuously monitored during the procedure by the interventional radiology nurse under my direct supervision. CONTRAST:  10mL OMNIPAQUE  IOHEXOL  300 MG/ML SOLN - administered into the collecting system(s) FLUOROSCOPY TIME:  Fluoroscopy Time:  (2 mGy). COMPLICATIONS: None PROCEDURE: Informed written consent was obtained from the patient after a thorough discussion of the procedural risks, benefits and alternatives. All questions were addressed. Maximal Sterile Barrier Technique was utilized including caps, mask, sterile gowns, sterile gloves, sterile drape, hand hygiene and skin antiseptic. A timeout was performed prior to the initiation of the procedure. Patient position in the supine position on the fluoroscopy table. The suprapubic region was prepped with chlorhexidine  in a sterile fashion, and a sterile drape was applied covering the operative field. A sterile gown and sterile gloves were used for the procedure. Local anesthesia was provided with 1% Lidocaine . Indwelling Foley catheter had been clamped prior to the procedure. Small amount of additional saline was infused to distend the urinary bladder. Ultrasound guidance was used to identify the urinary bladder in the midline just above the pubic bone. The skin and subcutaneous tissues were then generously infiltrated with 1% lidocaine  to the level of the anterior wall of the bladder for local anesthesia. A small stab incision was made in the skin, and an 18 gauge trocar was advanced under ultrasound guidance into the urinary bladder. 035 Amplatz wire was advanced  into the urinary bladder through the trocar under fluoroscopy. A 10 mm x 80 mm athletis standard balloon catheter loaded coaxial through a 16 French [place]ill tip urinary catheter were then advanced on the Amplatz wire into the urinary bladder. Balloon was inflated to profile, and after relief of the high resistance waist of the soft tissues, the balloon was deflated and both the urinary catheter and balloon catheter were advanced on the Amplatz wire into the urinary bladder. The wire and the balloon were removed through the urinary catheter. 10 cc of saline used to inflate the retention balloon which was then withdrawn to  the wall of the urinary bladder. Contrast was injected and final images were stored. Patient tolerated the procedure well and remained hemodynamically stable throughout. No complications were encountered and no significant blood loss was encountered. IMPRESSION: Status post image guided placement of suprapubic catheter. Signed, Marciano Settles. Rexine Cater, RPVI [facility] Electronically Signed   By: Myrlene Asper D.O.   On: 05/20/2023 14:55   CT ABDOMEN PELVIS W CONTRAST Result Date: 04/28/2023 CLINICAL DATA:  Left lower quadrant pain EXAM: CT ABDOMEN AND PELVIS WITH CONTRAST TECHNIQUE: Multidetector CT imaging of the abdomen and pelvis was performed using the standard protocol following bolus administration of intravenous contrast. RADIATION DOSE REDUCTION: This exam was performed according to the departmental dose-optimization program which includes automated exposure control, adjustment of the mA and/or kV according to patient size and/or use of iterative reconstruction technique. CONTRAST:  60mL OMNIPAQUE  IOHEXOL  300 MG/ML  SOLN COMPARISON:  04/03/2023 FINDINGS: Lower chest: No pleural or pericardial effusion. Visualized lung bases clear. Hepatobiliary: No focal liver abnormality is seen. No gallstones, gallbladder wall thickening, or  biliary dilatation. Pancreas: Unremarkable. No pancreatic ductal dilatation or surrounding inflammatory changes. Spleen: Normal in size without focal abnormality. Adrenals/Urinary Tract: No adrenal mass. Symmetric renal enhancement without focal lesion, hydronephrosis, or evident urolithiasis. Foley catheter decompresses the urinary bladder with diffuse moderate bladder wall thickening suspected. Stomach/Bowel: Stomach is partially distended, without acute finding. Small bowel decompressed. Post appendectomy. The colon is partially distended, with scattered diverticula predominately in the ascending segment; no adjacent inflammatory change. Vascular/Lymphatic: Moderate calcified aortoiliac plaque without AAA. Portal vein patent. No abdominal or pelvic adenopathy. Reproductive: Status post hysterectomy. No adnexal masses. Other: No ascites.  No free air. Musculoskeletal: No acute or significant osseous findings. IMPRESSION: 1. Diffuse bladder wall thickening. 2. Ascending colonic diverticulosis. 3.  Aortic Atherosclerosis (ICD10-I70.0). Electronically Signed   By: Nicoletta Barrier M.D.   On: 04/28/2023 15:56      Labs: BNP (last 3 results) Recent Labs    04/03/23 1359  BNP 26.7   Basic Metabolic Panel: Recent Labs  Lab 05/23/23 0517 05/24/23 0531 05/25/23 0418 05/26/23 0520 05/27/23 0538  NA 133* 133* 137 133* 139  K 3.9 3.8 3.7 3.6 3.2*  CL 103 103 103 102 106  CO2 15* 19* 25 24 25   GLUCOSE 160* 215* 192* 141* 135*  BUN 28* 24* 26* 29* 29*  CREATININE 2.19* 1.99* 1.87* 2.06* 1.94*  CALCIUM  8.1* 8.5* 9.0 8.4* 8.6*  MG  --   --   --   --  2.2   Liver Function Tests: No results for input(s): "AST", "ALT", "ALKPHOS", "BILITOT", "PROT", "ALBUMIN" in the last 168 hours. No results for input(s): "LIPASE", "AMYLASE" in the last 168 hours. No results for input(s): "AMMONIA" in the last 168 hours. CBC: Recent Labs  Lab 05/21/23 0815 05/22/23 0525 05/23/23 0517 05/23/23 1535 05/24/23 0531  05/25/23 0418 05/26/23 0520 05/27/23 0538  WBC 17.3*   < > 10.6*  --  8.4 7.5 9.4 9.8  NEUTROABS 14.1*  --   --   --   --   --   --   --   HGB 10.7*   < > 7.5* 10.1* 10.1* 10.5* 9.2* 9.8*  HCT 31.4*   < > 22.6* 29.8* 29.4* 30.1* 26.5* 29.1*  MCV 89.2   < > 91.9  --  87.2 86.0 84.9 87.7  PLT 257   < > 176  --  181 221 223 306   < > =  values in this interval not displayed.   Cardiac Enzymes: No results for input(s): "CKTOTAL", "CKMB", "CKMBINDEX", "TROPONINI" in the last 168 hours. BNP: Invalid input(s): "POCBNP" CBG: Recent Labs  Lab 05/27/23 1025 05/27/23 1104 05/27/23 1134 05/27/23 1242 05/27/23 1329  GLUCAP 78 74 212* 203* 210*   D-Dimer No results for input(s): "DDIMER" in the last 72 hours. Hgb A1c No results for input(s): "HGBA1C" in the last 72 hours. Lipid Profile No results for input(s): "CHOL", "HDL", "LDLCALC", "TRIG", "CHOLHDL", "LDLDIRECT" in the last 72 hours. Thyroid  function studies No results for input(s): "TSH", "T4TOTAL", "T3FREE", "THYROIDAB" in the last 72 hours.  Invalid input(s): "FREET3" Anemia work up No results for input(s): "VITAMINB12", "FOLATE", "FERRITIN", "TIBC", "IRON", "RETICCTPCT" in the last 72 hours. Urinalysis    Component Value Date/Time   COLORURINE STRAW (A) 05/21/2023 1800   APPEARANCEUR HAZY (A) 05/21/2023 1800   APPEARANCEUR Hazy 08/11/2013 0729   LABSPEC 1.004 (L) 05/21/2023 1800   LABSPEC 1.030 08/11/2013 0729   PHURINE 5.0 05/21/2023 1800   GLUCOSEU NEGATIVE 05/21/2023 1800   GLUCOSEU >=500 08/11/2013 0729   HGBUR LARGE (A) 05/21/2023 1800   BILIRUBINUR NEGATIVE 05/21/2023 1800   BILIRUBINUR Negative 08/11/2013 0729   KETONESUR NEGATIVE 05/21/2023 1800   PROTEINUR 30 (A) 05/21/2023 1800   NITRITE NEGATIVE 05/21/2023 1800   LEUKOCYTESUR NEGATIVE 05/21/2023 1800   LEUKOCYTESUR Negative 08/11/2013 0729   Sepsis Labs Recent Labs  Lab 05/24/23 0531 05/25/23 0418 05/26/23 0520 05/27/23 0538  WBC 8.4 7.5 9.4 9.8    Microbiology No results found for this or any previous visit (from the past 240 hours).   Total time spend on discharging this patient, including the last patient exam, discussing the hospital stay, instructions for ongoing care as it relates to all pertinent caregivers, as well as preparing the medical discharge records, prescriptions, and/or referrals as applicable, is 40 minutes.    Garrison Kanner, MD  [facility] 05/27/2023, 2:40 PM

## 2023-05-27 NOTE — Transfer of Care (Signed)
 Immediate Anesthesia Transfer of Care Note  Patient: Karen [name]  Procedure(s) Performed: CYSTOSCOPY, WITH BLADDER FULGURATION and clot evacuation  Patient Location: PACU  Anesthesia Type:General  Level of Consciousness: awake, alert , and oriented  Airway & Oxygen Therapy: Patient Spontanous Breathing  Post-op Assessment: Report given to RN and Post -op Vital signs reviewed and stable  Post vital signs: stable  Last Vitals:  Vitals Value Taken Time  BP 124/56 05/27/23 1231  Temp    Pulse 88 05/27/23 1234  Resp 29 05/27/23 1234  SpO2 97 % 05/27/23 1234  Vitals shown include unfiled device data.  Last Pain:  Vitals:   05/27/23 1022  TempSrc: Temporal  PainSc: 0-No pain      Patients Stated Pain Goal: 1 (05/25/23 1532)  Complications: No notable events documented.

## 2023-05-27 NOTE — Progress Notes (Signed)
 Urology Inpatient Progress Note  Subjective: No acute events overnight. She is afebrile, VSS. White count stable, 9.8.  Hemoglobin up, 9.8.  Creatinine down, 1.94.  She remains on antibiotics as below. Foley catheter in place draining pink urine.  CBI was stopped several minutes ago by nursing, per patient. She denies bladder discomfort this morning.  Anti-infectives: Anti-infectives (From admission, onward)    Start     Dose/Rate Route Frequency Ordered Stop   05/27/23 0930  levofloxacin  (LEVAQUIN ) IVPB 500 mg        500 mg 100 mL/hr over 60 Minutes Intravenous Every 48 hours 05/26/23 0842     05/25/23 0930  levofloxacin  (LEVAQUIN ) IVPB 750 mg  Status:  Discontinued        750 mg 100 mL/hr over 90 Minutes Intravenous Every 48 hours 05/25/23 0831 05/26/23 0843   05/25/23 0915  levofloxacin  (LEVAQUIN ) IVPB 750 mg  Status:  Discontinued        750 mg 100 mL/hr over 90 Minutes Intravenous Every 24 hours 05/25/23 0824 05/25/23 0831       Current Facility-Administered Medications  Medication Dose Route Frequency Provider Last Rate Last Admin   0.9 %  sodium chloride  infusion   Intravenous Continuous Garrison Kanner, MD 100 mL/hr at 05/26/23 2119 New Bag at 05/26/23 2119   amLODipine  (NORVASC ) tablet 10 mg  10 mg Oral Daily Cox, Amy N, DO   10 mg at 05/26/23 1610   ARIPiprazole  (ABILIFY ) tablet 5 mg  5 mg Oral Daily Cox, Amy N, DO   5 mg at 05/24/23 1034   bisacodyl  (DULCOLAX) EC tablet 5 mg  5 mg Oral Daily PRN Darus Engels A, DO   5 mg at 05/25/23 1532   Chlorhexidine  Gluconate Cloth 2 % PADS 6 each  6 each Topical Daily Darus Engels A, DO   6 each at 05/26/23 9604   cyclobenzaprine  (FLEXERIL ) tablet 5 mg  5 mg Oral BID PRN Cox, Amy N, DO   5 mg at 05/26/23 5409   diazepam  (VALIUM ) tablet 2 mg  2 mg Oral Q12H PRN Cox, Amy N, DO   2 mg at 05/26/23 8119   famotidine  (PEPCID ) tablet 20 mg  20 mg Oral QHS Cox, Amy N, DO   20 mg at 05/26/23 2100   feeding supplement (ENSURE ENLIVE / ENSURE  PLUS) liquid 237 mL  237 mL Oral BID BM Cox, Amy N, DO   237 mL at 05/26/23 1601   HYDROcodone -acetaminophen  (NORCO/VICODIN) 5-325 MG per tablet 1 tablet  1 tablet Oral Q6H PRN Cox, Amy N, DO   1 tablet at 05/27/23 1478   hydrOXYzine  (ATARAX ) tablet 25 mg  25 mg Oral Daily PRN Cox, Amy N, DO   25 mg at 05/26/23 1302   icosapent  Ethyl (VASCEPA ) 1 g capsule 2 g  2 g Oral BID Cox, Amy N, DO   2 g at 05/26/23 2100   insulin  aspart (novoLOG ) injection 0-5 Units  0-5 Units Subcutaneous QHS Cox, Amy N, DO   5 Units at 05/25/23 2110   insulin  aspart (novoLOG ) injection 0-9 Units  0-9 Units Subcutaneous TID WC Cox, Amy N, DO   1 Units at 05/27/23 0816   insulin  aspart (novoLOG ) injection 3 Units  3 Units Subcutaneous TID WC Darus Engels A, DO   3 Units at 05/26/23 1300   insulin  glargine-yfgn (SEMGLEE ) injection 8 Units  8 Units Subcutaneous QHS Darus Engels A, DO   8 Units at 05/26/23 2101   levofloxacin  (  LEVAQUIN ) IVPB 500 mg  500 mg Intravenous Q48H Ramonita Burow, [facility]       lipase/protease/amylase (CREON ) capsule 72,000 Units  72,000 Units Oral TID WC Cox, Amy N, DO   72,000 Units at 05/26/23 1811   nortriptyline  (PAMELOR ) capsule 25 mg  25 mg Oral QHS Cox, Amy N, DO   25 mg at 05/26/23 2100   Oral care mouth rinse  15 mL Mouth Rinse PRN Cox, Amy N, DO       pantoprazole  (PROTONIX ) EC tablet 80 mg  80 mg Oral Daily Cox, Amy N, DO   80 mg at 05/26/23 8756   polyethylene glycol (MIRALAX  / GLYCOLAX ) packet 17 g  17 g Oral QHS Cox, Amy N, DO   17 g at 05/25/23 2108   senna-docusate (Senokot-S) tablet 1 tablet  1 tablet Oral BID Darus Engels A, DO   1 tablet at 05/26/23 2106   sodium chloride  irrigation 0.9 % 3,000 mL  3,000 mL Irrigation Continuous Cox, Amy N, DO 0 mL/hr at 05/25/23 1000 3,000 mL at 05/26/23 0306   zolpidem  (AMBIEN ) tablet 5 mg  5 mg Oral QHS PRN Cox, Amy N, DO   5 mg at 05/25/23 2108   Objective: Vital signs in last 24 hours: Temp:  [98.1 F (36.7 C)-98.9 F (37.2 C)]  98.1 F (36.7 C) (05/22 0750) Pulse Rate:  [86-92] 88 (05/22 0750) Resp:  [16-20] 18 (05/22 0750) BP: (98-131)/(63-77) 124/69 (05/22 0750) SpO2:  [95 %-99 %] 97 % (05/22 0750)  Intake/Output from previous day: 05/21 0701 - 05/22 0700 In: 3240 [P.O.:240] Out: 5425 [Urine:5425] Intake/Output this shift: No intake/output data recorded.  Physical Exam Vitals and nursing note reviewed.  Constitutional:      General: She is not in acute distress.    Appearance: She is not ill-appearing, toxic-appearing or diaphoretic.  HENT:     Head: Normocephalic and atraumatic.  Pulmonary:     Effort: Pulmonary effort is normal. No respiratory distress.  Abdominal:     General: There is no distension.     Palpations: Abdomen is soft.     Tenderness: There is no abdominal tenderness.  Skin:    General: Skin is warm and dry.  Neurological:     Mental Status: She is alert and oriented to person, place, and time.  Psychiatric:        Mood and Affect: Mood normal.        Behavior: Behavior normal.    Lab Results:  Recent Labs    05/26/23 0520 05/27/23 0538  WBC 9.4 9.8  HGB 9.2* 9.8*  HCT 26.5* 29.1*  PLT 223 306   BMET Recent Labs    05/26/23 0520 05/27/23 0538  NA 133* 139  K 3.6 3.2*  CL 102 106  CO2 24 25  GLUCOSE 141* 135*  BUN 29* 29*  CREATININE 2.06* 1.94*  CALCIUM  8.4* 8.6*   Studies/Results: US  PELVIS LIMITED (TRANSABDOMINAL ONLY) Result Date: 05/25/2023 CLINICAL DATA:  Gross hematuria EXAM: TRANSABDOMINAL ULTRASOUND OF PELVIS TECHNIQUE: Transabdominal ultrasound examination of the bladder. COMPARISON:  Pelvic ultrasound 05/23/2023 FINDINGS: Foley catheter is seen in the bladder. There is mild diffuse bladder wall thickening and echogenic debris within the bladder. Findings are similar to prior. IMPRESSION: Stable mild diffuse bladder wall thickening and echogenic debris within the bladder. Findings are concerning for cystitis and hemorrhage in the bladder.  Electronically Signed   By: Tyron Gallon M.D.   On: 05/25/2023 18:42   Assessment & Plan:  68 y.o. female with PMH neurogenic bladder s/p SP tube placement with IR now admitted with gross hematuria and clot retention on CBI and s/p TXA x 2 and 1 unit PRBCs.  Blood counts are stable, though she is having persistent hematuria.  Her hematuria appears to wax and wane, so I recommended proceeding to the OR with Dr. Estanislao Heimlich today for cystoscopy with possible clot evacuation and fulguration and she agreed.  Recommendations: - Keep n.p.o. in advance of procedure - Cystoscopy with clot evacuation and fulguration with Dr. Estanislao Heimlich at midday today, informed consent order in - Continue antibiotics  Arta Stump, PA-C 05/27/2023

## 2023-05-27 NOTE — TOC Transition Note (Signed)
 Transition of Care [facility]) - Discharge Note   Patient Details  Name: Karen [name] MRN: [id number] Date of Birth: 1955-11-23  [facility] CM/SW Contact:  Odilia Bennett, LCSW Phone Number: 05/27/2023, 2:39 PM   Clinical Narrative:  Patient has orders to discharge home today. No further concerns. CSW signing off.   Final next level of care: Home/Self Care Barriers to Discharge: Barriers Resolved   Patient Goals and CMS Choice            Discharge Placement                Patient to be transferred to facility by: Significant other   Patient and family notified of of transfer: 05/27/23  Discharge Plan and Services Additional resources added to the After Visit Summary for       Post Acute Care Choice: NA                               Social Drivers of Health (SDOH) Interventions SDOH Screenings   Food Insecurity: No Food Insecurity (05/21/2023)  Housing: Low Risk  (05/21/2023)  Transportation Needs: No Transportation Needs (05/21/2023)  Utilities: Not At Risk (05/21/2023)  Depression (PHQ2-9): High Risk (04/12/2023)  Financial Resource Strain: Low Risk  (10/31/2021)   Received from [facility]  Social Connections: Socially Isolated (05/21/2023)  Tobacco Use: Medium Risk (05/27/2023)     Readmission Risk Interventions    05/24/2023   11:28 AM  Readmission Risk Prevention Plan  Transportation Screening Complete  PCP or Specialist Appt within 3-5 Days Complete  Social Work Consult for Recovery Care Planning/Counseling Complete  Palliative Care Screening Not Applicable  Medication Review [employer]) Complete

## 2023-05-28 ENCOUNTER — Encounter: Payer: Self-pay | Admitting: Urology

## 2023-06-01 ENCOUNTER — Encounter: Payer: Self-pay | Admitting: Family

## 2023-06-01 ENCOUNTER — Ambulatory Visit: Admitting: Family

## 2023-06-01 VITALS — BP 130/60 | HR 61 | Ht 61.0 in | Wt 100.2 lb

## 2023-06-01 DIAGNOSIS — R339 Retention of urine, unspecified: Secondary | ICD-10-CM

## 2023-06-01 DIAGNOSIS — N3001 Acute cystitis with hematuria: Secondary | ICD-10-CM | POA: Diagnosis not present

## 2023-06-01 DIAGNOSIS — Z0130 Encounter for examination of blood pressure without abnormal findings: Secondary | ICD-10-CM

## 2023-06-01 DIAGNOSIS — T83010A Breakdown (mechanical) of cystostomy catheter, initial encounter: Secondary | ICD-10-CM

## 2023-06-01 MED ORDER — DIAZEPAM 2 MG PO TABS: 2.0000 mg | ORAL_TABLET | Freq: Two times a day (BID) | ORAL | 2 refills | Status: DC | PRN

## 2023-06-01 MED ORDER — HYDROCODONE-ACETAMINOPHEN 5-325 MG PO TABS: 1.0000 | ORAL_TABLET | Freq: Four times a day (QID) | ORAL | 0 refills | Status: AC | PRN

## 2023-06-08 ENCOUNTER — Encounter: Payer: Self-pay | Admitting: Emergency Medicine

## 2023-06-08 ENCOUNTER — Emergency Department
Admission: EM | Admit: 2023-06-08 | Discharge: 2023-06-08 | Disposition: A | Discharge status: Home / Self Care | Attending: Emergency Medicine | Admitting: Emergency Medicine

## 2023-06-08 ENCOUNTER — Emergency Department

## 2023-06-08 ENCOUNTER — Other Ambulatory Visit: Payer: Self-pay

## 2023-06-08 DIAGNOSIS — N3000 Acute cystitis without hematuria: Secondary | ICD-10-CM | POA: Insufficient documentation

## 2023-06-08 DIAGNOSIS — E119 Type 2 diabetes mellitus without complications: Secondary | ICD-10-CM | POA: Insufficient documentation

## 2023-06-08 DIAGNOSIS — R1030 Lower abdominal pain, unspecified: Secondary | ICD-10-CM

## 2023-06-08 DIAGNOSIS — R1031 Right lower quadrant pain: Secondary | ICD-10-CM | POA: Diagnosis present

## 2023-06-08 LAB — URINALYSIS, ROUTINE W REFLEX MICROSCOPIC
Bilirubin Urine: NEGATIVE
Glucose, UA: >=500 mg/dL — AB
Ketones, ur: NEGATIVE mg/dL
Nitrite: NEGATIVE
Protein, ur: 100 mg/dL — AB
Specific Gravity, Urine: 1.002 — ABNORMAL LOW (ref 1.005–1.030)
Squamous Epithelial / HPF: 0 /HPF (ref 0–5)
WBC, UA: >50 WBC/hpf (ref 0–5)
pH: 6 (ref 5.0–8.0)

## 2023-06-08 LAB — COMPREHENSIVE METABOLIC PANEL WITH GFR
ALT: 34 U/L (ref 0–44)
AST: 47 U/L — ABNORMAL HIGH (ref 15–41)
Albumin: 3.3 g/dL — ABNORMAL LOW (ref 3.5–5.0)
Alkaline Phosphatase: 347 U/L — ABNORMAL HIGH (ref 38–126)
Anion gap: 12 (ref 5–15)
BUN: 36 mg/dL — ABNORMAL HIGH (ref 8–23)
CO2: 20 mmol/L — ABNORMAL LOW (ref 22–32)
Calcium: 8.3 mg/dL — ABNORMAL LOW (ref 8.9–10.3)
Chloride: 102 mmol/L (ref 98–111)
Creatinine, Ser: 2.04 mg/dL — ABNORMAL HIGH (ref 0.44–1.00)
GFR, Estimated: 26 mL/min — ABNORMAL LOW (ref >60)
Glucose, Bld: 143 mg/dL — ABNORMAL HIGH (ref 70–99)
Potassium: 3.5 mmol/L (ref 3.5–5.1)
Sodium: 134 mmol/L — ABNORMAL LOW (ref 135–145)
Total Bilirubin: 0.6 mg/dL (ref 0.0–1.2)
Total Protein: 7.1 g/dL (ref 6.5–8.1)

## 2023-06-08 LAB — CBC
HCT: 26.3 % — ABNORMAL LOW (ref 36.0–46.0)
Hemoglobin: 9.1 g/dL — ABNORMAL LOW (ref 12.0–15.0)
MCH: 30 pg (ref 26.0–34.0)
MCHC: 34.6 g/dL (ref 30.0–36.0)
MCV: 86.8 fL (ref 80.0–100.0)
Platelets: 251 10*3/uL (ref 150–400)
RBC: 3.03 MIL/uL — ABNORMAL LOW (ref 3.87–5.11)
RDW: 12.4 % (ref 11.5–15.5)
WBC: 6 10*3/uL (ref 4.0–10.5)
nRBC: 0 % (ref 0.0–0.2)

## 2023-06-08 LAB — LIPASE, BLOOD: Lipase: 62 U/L — ABNORMAL HIGH (ref 11–51)

## 2023-06-08 MED ORDER — HYDROMORPHONE HCL 1 MG/ML IJ SOLN
0.5000 mg | Freq: Once | INTRAMUSCULAR | Status: AC
  Administered 2023-06-08: 0.5 mg via INTRAVENOUS
  Filled 2023-06-08: qty 0.5

## 2023-06-08 MED ORDER — MORPHINE SULFATE (PF) 4 MG/ML IV SOLN
4.0000 mg | Freq: Once | INTRAVENOUS | Status: AC
  Administered 2023-06-08: 4 mg via INTRAVENOUS
  Filled 2023-06-08: qty 1

## 2023-06-08 MED ORDER — FLUCONAZOLE 50 MG PO TABS
150.0000 mg | ORAL_TABLET | Freq: Once | ORAL | Status: AC
  Administered 2023-06-08: 150 mg via ORAL
  Filled 2023-06-08: qty 1

## 2023-06-08 MED ORDER — LEVOFLOXACIN 250 MG PO TABS: 250.0000 mg | ORAL_TABLET | Freq: Every day | ORAL | 0 refills | Status: AC

## 2023-06-08 MED ORDER — ONDANSETRON HCL 4 MG/2ML IJ SOLN
4.0000 mg | Freq: Once | INTRAMUSCULAR | Status: AC
  Administered 2023-06-08: 4 mg via INTRAVENOUS
  Filled 2023-06-08: qty 2

## 2023-06-08 MED ORDER — LACTATED RINGERS IV BOLUS
1000.0000 mL | Freq: Once | INTRAVENOUS | Status: AC
  Administered 2023-06-08: 1000 mL via INTRAVENOUS

## 2023-06-08 MED ORDER — LEVOFLOXACIN IN D5W 500 MG/100ML IV SOLN
500.0000 mg | Freq: Once | INTRAVENOUS | Status: AC
  Administered 2023-06-08: 500 mg via INTRAVENOUS
  Filled 2023-06-08: qty 100

## 2023-06-08 NOTE — ED Triage Notes (Signed)
 Patient to ED via POV for post-op problem. Pt states her suprapubic catheter feels lose. States she is having abd pain with it that started this AM.

## 2023-06-08 NOTE — ED Provider Notes (Signed)
 [facility] Provider Note    Event Date/Time   First MD Initiated Contact with Patient 06/08/23 1536     (approximate)   History   Post-op Problem   HPI  Karen [name] is a 68 y.o. female who presents to the ED for evaluation of Post-op Problem   Review 5/22 medical DC summary, history neurogenic bladder from poorly controlled DM.  Recently placed suprapubic catheter on 5/15.  Admitted for hematuria, believed due to recently placed suprapubic catheter and immature tract, three-way Foley catheter was placed urethral for CBI.  Discharged with Levaquin   Patient presents for a few hours of RLQ abdominal pain, nausea and sensation that her suprapubic catheter is loose.  Reports it is still draining and functioning, she emptied the bag just prior to arrival and when I evaluate her and has a couple 100 cc of clear yellow urine in the catheter bag.  No emesis, just nausea.  A "normal" stool yesterday without stool changes alongside her discomfort.  No fevers  Physical Exam   Triage Vital Signs: ED Triage Vitals  Encounter Vitals Group     BP 06/08/23 1446 (!) 154/86     Systolic BP Percentile --      Diastolic BP Percentile --      Pulse Rate 06/08/23 1446 (!) 101     Resp 06/08/23 1446 17     Temp 06/08/23 1446 98.9 F (37.2 C)     Temp Source 06/08/23 1446 Oral     SpO2 06/08/23 1446 98 %     Weight 06/08/23 1444 101 lb (45.8 kg)     Height 06/08/23 1444 5\' 1"  (1.549 m)     Head Circumference --      Peak Flow --      Pain Score 06/08/23 1444 9     Pain Loc --      Pain Education --      Exclude from Growth Chart --     Most recent vital signs: Vitals:   06/08/23 1906 06/08/23 1927  BP: (!) 163/77   Pulse: 86   Resp: 16   Temp:  98.7 F (37.1 C)  SpO2: 100%     General: Awake, no distress.  Seems mildly uncomfortable CV:  Good peripheral perfusion.  Resp:  Normal effort.  Abd:  No distention.  Soft with diffuse and mild tenderness, more  pronounced tenderness to the suprapubic and RLQ abdomen. MSK:  No deformity noted.  Neuro:  No focal deficits appreciated. Other:     ED Results / Procedures / Treatments   Labs (all labs ordered are listed, but only abnormal results are displayed) Labs Reviewed  LIPASE, BLOOD - Abnormal; Notable for the following components:      Result Value   Lipase 62 (*)    All other components within normal limits  COMPREHENSIVE METABOLIC PANEL WITH GFR - Abnormal; Notable for the following components:   Sodium 134 (*)    CO2 20 (*)    Glucose, Bld 143 (*)    BUN 36 (*)    Creatinine, Ser 2.04 (*)    Calcium  8.3 (*)    Albumin 3.3 (*)    AST 47 (*)    Alkaline Phosphatase 347 (*)    GFR, Estimated 26 (*)    All other components within normal limits  CBC - Abnormal; Notable for the following components:   RBC 3.03 (*)    Hemoglobin 9.1 (*)    HCT 26.3 (*)  All other components within normal limits  URINALYSIS, ROUTINE W REFLEX MICROSCOPIC - Abnormal; Notable for the following components:   Color, Urine YELLOW (*)    APPearance CLOUDY (*)    Specific Gravity, Urine 1.002 (*)    Glucose, UA >=500 (*)    Hgb urine dipstick SMALL (*)    Protein, ur 100 (*)    Leukocytes,Ua LARGE (*)    Bacteria, UA MANY (*)    All other components within normal limits  URINE CULTURE    EKG   RADIOLOGY CT abdomen/pelvis interpreted by me with suprapubic catheter in place with thickened urinary bladder  Official radiology report(s): CT ABDOMEN PELVIS WO CONTRAST Result Date: 06/08/2023 CLINICAL DATA:  RLQ pain, suprapubic catheter in place. nausea EXAM: CT ABDOMEN AND PELVIS WITHOUT CONTRAST TECHNIQUE: Multidetector CT imaging of the abdomen and pelvis was performed following the standard protocol without IV contrast. RADIATION DOSE REDUCTION: This exam was performed according to the departmental dose-optimization program which includes automated exposure control, adjustment of the mA and/or kV  according to patient size and/or use of iterative reconstruction technique. COMPARISON:  Pelvic CT 05/21/2023, abdominopelvic CT 04/28/2023 FINDINGS: Lower chest: Clear lung bases. Hepatobiliary: Unremarkable unenhanced appearance of the liver. Gallbladder physiologically distended, no calcified stone. No biliary dilatation. Pancreas: No ductal dilatation or inflammation. Spleen: Normal in size without focal abnormality. Adrenals/Urinary Tract: No adrenal nodule. No hydronephrosis. No renal calculi. Scratch joint punctate nonobstructing stone in the upper right kidney. Symmetric bilateral perinephric stranding, unchanged. Both ureters are decompressed. Suprapubic catheter in place. Catheter balloon is in the bladder. There is circumferential bladder wall thickening. Previous hyperdensity surrounding the catheter is no longer seen, likely diminished hematoma. No fluid or inflammation along the catheter tract. Stomach/Bowel: Moderate colonic stool burden. No bowel obstruction or inflammation. Right colonic diverticula. The appendix is not visualized, appendectomy per history. Unremarkable appearance of the stomach. Vascular/Lymphatic: Aorto bi-iliac atherosclerosis. No aortic aneurysm. No enlarged lymph nodes in the abdomen or pelvis. Reproductive: Status post hysterectomy. No adnexal masses. Other: No ascites or free air. Diminutive fat containing umbilical hernia. Musculoskeletal: Degenerative disc disease in the lumbar spine. No acute osseous findings. 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 IMPRESSION: 1. Suprapubic catheter in place. Circumferential bladder wall thickening, can be seen with cystitis. Previous hyperdensity surrounding the catheter is no longer seen, likely diminished hematoma. 2. Punctate nonobstructing right renal stone. 3. Moderate colonic stool burden. Aortic Atherosclerosis (ICD10-I70.0). Electronically Signed   By: Chadwick Colonel M.D.   On: 06/08/2023 17:36    PROCEDURES  and INTERVENTIONS:  Procedures  Medications  lactated ringers  bolus 1,000 mL (0 mLs Intravenous Stopped 06/08/23 1906)  morphine  (PF) 4 MG/ML injection 4 mg (4 mg Intravenous Given 06/08/23 1617)  ondansetron  (ZOFRAN ) injection 4 mg (4 mg Intravenous Given 06/08/23 1618)  HYDROmorphone  (DILAUDID ) injection 0.5 mg (0.5 mg Intravenous Given 06/08/23 1738)  levofloxacin  (LEVAQUIN ) IVPB 500 mg (0 mg Intravenous Stopped 06/08/23 2022)  fluconazole (DIFLUCAN) tablet 150 mg (150 mg Oral Given 06/08/23 2118)     IMPRESSION / MDM / ASSESSMENT AND PLAN / ED COURSE  I reviewed the triage vital signs and the nursing notes.  Differential diagnosis includes, but is not limited to, catheter malfunction or dislodged, UTI, pyelonephritis, sepsis, diverticulitis, appendicitis  {Patient presents with symptoms of an acute illness or injury that is potentially life-threatening.  Patient with suprapubic catheter presents with pain and signs of cystitis suitable for outpatient management with antibiotics.  Presenting tachycardia is noted but no other  SIRS criteria to indicate sepsis.  She look systemically well.  Has mild glass tenderness.  Normal WBC and normocytic anemia at baseline.  Renal dysfunction around baseline.  Urine is sent for culture.  Has budding yeast and she is provided Levaquin  and Diflucan.  Clinical Course as of 06/08/23 2131  Tue Jun 08, 2023  1921 Reassessed, pain improved, discussed CT results, urinary infection, antibiotics and plan of care.  She is agreeable. [DS]  2128 Reassessed, patient sitting upright in the edge of the bed and looks well.  She is requesting discharge.  We discussed possible admission but ultimately we decided for discharge with antibiotics and close PCP follow-up.  Discussed return precautions and PCP follow-up. [DS]    Clinical Course User Index [DS] Arline Bennett, MD     FINAL CLINICAL IMPRESSION(S) / ED DIAGNOSES   Final diagnoses:  Lower abdominal pain  Acute  cystitis without hematuria     Rx / DC Orders   ED Discharge Orders          Ordered    levofloxacin  (LEVAQUIN ) 250 MG tablet  Daily        06/08/23 2130             Note:  This document was prepared using Dragon voice recognition software and may include unintentional dictation errors.   Arline Bennett, MD 06/08/23 2133

## 2023-06-08 NOTE — Discharge Instructions (Signed)
 Levaquin  antibiotics once daily for 7 more days.  Return to the ED with any worsening symptoms

## 2023-06-08 NOTE — ED Notes (Signed)
 Pt ambulated well with no distress noted. Pt endorses headache from not eating all day.

## 2023-06-10 LAB — URINE CULTURE: Culture: >=100000 — AB

## 2023-06-17 ENCOUNTER — Ambulatory Visit (INDEPENDENT_AMBULATORY_CARE_PROVIDER_SITE_OTHER): Admitting: Physician Assistant

## 2023-06-17 VITALS — BP 102/60 | HR 90 | Ht 60.0 in | Wt 99.0 lb

## 2023-06-17 DIAGNOSIS — N3289 Other specified disorders of bladder: Secondary | ICD-10-CM

## 2023-06-17 DIAGNOSIS — Z435 Encounter for attention to cystostomy: Secondary | ICD-10-CM | POA: Diagnosis not present

## 2023-06-17 LAB — URINALYSIS, COMPLETE
Bilirubin, UA: NEGATIVE
Nitrite, UA: NEGATIVE
Specific Gravity, UA: 1.02 (ref 1.005–1.030)
Urobilinogen, Ur: 0.2 mg/dL (ref 0.2–1.0)
pH, UA: 6 (ref 5.0–7.5)

## 2023-06-17 LAB — MICROSCOPIC EXAMINATION
RBC, Urine: >30 /HPF — AB (ref 0–2)
WBC, UA: >30 /HPF — AB (ref 0–5)

## 2023-06-17 MED ORDER — OXYBUTYNIN CHLORIDE ER 10 MG PO TB24: 10.0000 mg | ORAL_TABLET | Freq: Every day | ORAL | 0 refills | Status: DC

## 2023-06-17 NOTE — Progress Notes (Signed)
 Suprapubic Cath Change  Patient is present today for a suprapubic catheter change due to urinary retention.  8ml of water was drained from the balloon, a 16FR foley cath was removed from the tract without difficulty.  Site was cleaned and prepped in a sterile fashion with betadine.  A 16FR Silastic foley cath was replaced into the tract no complications were noted. Urine return was noted, 10 ml of sterile water was inflated into the balloon and a leg bag was attached for drainage.  Patient tolerated well.     Performed by: Adeoluwa Silvers, PA-C  Additional notes: She reports intermittent painful bladder spasms. She has a history of dry mouth and dry eye, but will need to try antimuscarinics first due to insurance. Will send in oxybutynin XL and reassess next month.  She continues to have abdominal soreness. She's worried about UTI. On exam there is scant redness around her insertion site, no frank purulence. I think some of her discomfort is due to new SPT and we discussed that things will become more comfortable as the tract heals, though I did get a sample from her new tube today for UA/culture.  Follow up: Return in about 4 weeks (around 07/15/2023) for SPT exchange.

## 2023-06-22 ENCOUNTER — Ambulatory Visit: Payer: Self-pay | Admitting: Physician Assistant

## 2023-06-22 LAB — CULTURE, URINE COMPREHENSIVE

## 2023-06-28 MED ORDER — SULFAMETHOXAZOLE-TRIMETHOPRIM 400-80 MG PO TABS: 1.0000 | ORAL_TABLET | Freq: Two times a day (BID) | ORAL | 0 refills | Status: DC

## 2023-06-28 NOTE — Telephone Encounter (Signed)
 Patient called today stating in the last 4 to 5 days she has been having trouble with abdominal soreness/pain off and on, urgency to urinate and having to strain to get urine started off and on. Patient requested antibiotic to be sent in for recent infection. RX sent in . Advised to keep her appointment on 07/01/23

## 2023-07-01 ENCOUNTER — Ambulatory Visit: Admitting: Physician Assistant

## 2023-07-01 ENCOUNTER — Other Ambulatory Visit: Payer: Self-pay | Admitting: Family

## 2023-07-01 ENCOUNTER — Encounter: Payer: Self-pay | Admitting: Physician Assistant

## 2023-07-01 VITALS — BP 106/64 | HR 80 | Ht 61.0 in | Wt 99.0 lb

## 2023-07-01 DIAGNOSIS — T839XXA Unspecified complication of genitourinary prosthetic device, implant and graft, initial encounter: Secondary | ICD-10-CM

## 2023-07-01 DIAGNOSIS — Z87440 Personal history of urinary (tract) infections: Secondary | ICD-10-CM

## 2023-07-01 DIAGNOSIS — N3289 Other specified disorders of bladder: Secondary | ICD-10-CM

## 2023-07-01 LAB — BLADDER SCAN AMB NON-IMAGING

## 2023-07-01 MED ORDER — MIRABEGRON ER 50 MG PO TB24: 50.0000 mg | ORAL_TABLET | Freq: Every day | ORAL | 11 refills | Status: AC

## 2023-07-01 NOTE — Progress Notes (Signed)
 07/01/2023 4:28 PM   Karen [name] 1955/05/19 [id number]  CC: Chief Complaint  Patient presents with   Other   HPI: BRIANTE LOVEALL is a 68 y.o. female with PMH CKD, uncontrolled diabetes, diabetic cystopathy managed with SPT, and recurrent UTIs who presents today for evaluation of SPT issues.  There have been several instances in the past week of a nondraining SP tube associated with abdominal distention/tightness and her voiding per urethra.  She reports ongoing abdominal wall soreness around her catheter.  She also has intermittent sensations of urinary urgency.  At her last visit with me, I gave her oxybutynin  XL 10 mg for bladder spasms, however she had to stop this due to severe dry mouth.    Bladder scan 0mL.  PMH: Past Medical History:  Diagnosis Date   Abdominal pain    Acute cystitis 05/18/2020   Acute kidney injury superimposed on CKD (HCC)    Acute on chronic pancreatitis (HCC) 01/03/2017   Acute renal insufficiency 10/29/2015   Adhesive capsulitis of left shoulder    Alcoholic pancreatitis    Asthma    Depression    Diabetes mellitus without complication (HCC)    Diabetic cataract of right eye (HCC) 08/31/2019   Formatting of this note might be different from the original. Added automatically from request for surgery [id number]   DKA (diabetic ketoacidoses) 02/17/2015   DKA (diabetic ketoacidosis) (HCC) 02/17/2015   IMO SNOMED Dx Update Oct 2024     Elevated alkaline phosphatase level 10/30/2021   Emphysematous cystitis    Esophageal candidiasis (HCC)    ETOH abuse 01/03/2017   Headache 02/26/2019   Hypercholesteremia    Hypertension    Hypertensive urgency 04/21/2016   Hypokalemia    Left flank pain 10/29/2015   Left upper quadrant abdominal pain 05/18/2020   Leukocytosis 06/13/2019   Malignant essential hypertension 10/29/2015   Upper GI bleed 11/11/2017   UTI (urinary tract infection) 01/24/2018    Surgical History: Past Surgical History:  Procedure  Laterality Date   ABDOMINAL HYSTERECTOMY  1996   APPENDECTOMY  1997   CYSTOSCOPY WITH FULGERATION N/A 05/27/2023   Procedure: CYSTOSCOPY, WITH BLADDER FULGURATION and clot evacuation;  Surgeon: Francisca Redell BROCKS, MD;  Location: [facility];  Service: Urology;  Laterality: N/A;   ESOPHAGOGASTRODUODENOSCOPY N/A 11/12/2017   Procedure: ESOPHAGOGASTRODUODENOSCOPY (EGD);  Surgeon: Unk Corinn Skiff, MD;  Location: [facility];  Service: Gastroenterology;  Laterality: N/A;   ESOPHAGOGASTRODUODENOSCOPY N/A 09/29/2018   Procedure: ESOPHAGOGASTRODUODENOSCOPY (EGD);  Surgeon: Toledo, Ladell POUR, MD;  Location: [facility]OS[facility];  Service: Gastroenterology;  Laterality: N/A;   ESOPHAGOGASTRODUODENOSCOPY (EGD) WITH PROPOFOL  N/A 04/24/2016   Procedure: ESOPHAGOGASTRODUODENOSCOPY (EGD) WITH PROPOFOL ;  Surgeon: Rogelia Copping, MD;  Location: [facility] ENDOSCOPY;  Service: Endoscopy;  Laterality: N/A;   ESOPHAGOGASTRODUODENOSCOPY (EGD) WITH PROPOFOL  N/A 01/28/2019   Procedure: ESOPHAGOGASTRODUODENOSCOPY (EGD) WITH PROPOFOL ;  Surgeon: Toledo, Ladell POUR, MD;  Location: [facility] ENDOSCOPY;  Service: Gastroenterology;  Laterality: N/A;   EYE SURGERY     HAND SURGERY  1988   IR CYSTOSTOMY TUBE PLACEMENT/BLADDER ASPIRATION  05/20/2023   THYROID  SURGERY  2013    Home Medications:  Allergies as of 07/01/2023       Reactions   Metoclopramide  Other (See Comments), Shortness Of Breath   Hypotension, shortness of breath Hypotension, shortness of breath  Hypotension, shortness of breath Hypotension, shortness of breath    Other Reaction(s): Other (See Comments)    Hypotension, shortness of breath  Hypotension, shortness of breath Hypotension, shortness of breath  Shortness of Breath, Hypotension   Penicillins Anaphylaxis, Rash, Swelling   Has patient had a PCN reaction causing immediate rash, facial/tongue/throat swelling, SOB or lightheadedness with hypotension: Yes Has patient had a PCN reaction causing severe rash involving mucus  membranes or skin necrosis: No Has patient had a PCN reaction that required hospitalization No Has patient had a PCN reaction occurring within the last 10 years: No If all of the above answers are NO, then may proceed with Cephalosporin use. Has patient had a PCN reaction causing immediate rash, facial/tongue/throat swelling, SOB or lightheadedness with hypotension: Yes  Has patient had a PCN reaction causing severe rash involving mucus membranes or skin necrosis: No  Has patient had a PCN reaction that required hospitalization No  Has patient had a PCN reaction occurring within the last 10 years: No  If all of the above answers are NO, then may proceed with Cephalosporin use. Has patient had a PCN reaction causing immediate rash, facial/tongue/throat swelling, SOB or lightheadedness with hypotension: Yes  Has patient had a PCN reaction causing severe rash involving mucus membranes or skin necrosis: No  Has patient had a PCN reaction that required hospitalization No  Has patient had a PCN reaction occurring within the last 10 years: No  If all of the above answers are NO, then may proceed with Cephalosporin use.  Has patient had a PCN reaction causing immediate rash, facial/tongue/throat swelling, SOB or lightheadedness with hypotension: Yes Has patient had a PCN reaction causing severe rash involving mucus membranes or skin necrosis: No Has patient had a PCN reaction that required hospitalization No Has patient had a PCN reaction occurring within the last 10 years: No If all of the above answers are NO, then may proceed with Cephalosporin use.   Fentanyl  Rash   Rash   Other Rash   Bee sting  Bee sting Bee sting    Bee sting   Bee sting   Oxycodone -acetaminophen  Rash   plain tylenol  can also make itch? plain tylenol  can also make itch?  plain tylenol  can also make itch? plain tylenol  can also make itch?    plain tylenol  can also make itch?  plain tylenol  can also make itch? plain  tylenol  can also make itch?   Percocet [oxycodone -acetaminophen ] Rash   plain tylenol  can also make itch?        Medication List        Accurate as of July 01, 2023  4:28 PM. If you have any questions, ask your nurse or doctor.          PAUSE taking these medications    hydrochlorothiazide  12.5 MG tablet Wait to take this until your doctor or other care provider tells you to start again. Due to low normal blood pressure. Commonly known as: HYDRODIURIL  TAKE 1 TABLET BY MOUTH ONCE DAILY FOR SWELLING IN LEGS       STOP taking these medications    oxybutynin  10 MG 24 hr tablet Commonly known as: DITROPAN -XL Stopped by: Lucie Hones       TAKE these medications    amLODipine  10 MG tablet Commonly known as: NORVASC  Take 1 tablet (10 mg total) by mouth daily. for blood pressure   ARIPiprazole  5 MG tablet Commonly known as: ABILIFY  Take 1 tablet (5 mg total) by mouth daily.   cetirizine  5 MG tablet Commonly known as: ZYRTEC  Take 1 tablet (5 mg total) by mouth daily.   cyclobenzaprine  5 MG tablet Commonly known as: FLEXERIL  TAKE 1 TABLET  BY MOUTH UP TO TWICE A DAYAS NEEDED FOR NECK AND BACK PAIN   diazepam  2 MG tablet Commonly known as: VALIUM  Take 1 tablet (2 mg total) by mouth every 12 (twelve) hours as needed for muscle spasms.   docusate sodium  100 MG capsule Commonly known as: COLACE TAKE 1 CAPSULE BY MOUTH TWICE DAILY   empagliflozin 25 MG Tabs tablet Commonly known as: JARDIANCE Take 25 mg by mouth daily.   famotidine  20 MG tablet Commonly known as: PEPCID  TAKE 1 TABLET BY MOUTH TWICE DAILY   feeding supplement Liqd Take 237 mLs by mouth 2 (two) times daily between meals.   HYDROcodone -acetaminophen  5-325 MG tablet Commonly known as: NORCO/VICODIN TAKE 1 TABLET BY MOUTH 4 TIMES DAILY AS NEEDED FOR PAIN   HYDROcodone -acetaminophen  5-325 MG tablet Commonly known as: NORCO/VICODIN Take 1-2 tablets by mouth every 6 (six) hours as  needed for moderate pain (pain score 4-6).   hydrOXYzine  25 MG tablet Commonly known as: ATARAX  TAKE 1 TABLET BY MOUTH TWICE DAILY AS NEEDED UNTIL RASH GOES AWAY   Insulin  Pen Needle 29G X Misc 1 Dose by Does not apply route 3 (three) times daily before meals.   mirabegron ER 50 MG Tb24 tablet Commonly known as: MYRBETRIQ Take 1 tablet (50 mg total) by mouth daily. Started by: Jahmani Staup   Mounjaro  5 MG/0.5ML Pen Generic drug: tirzepatide  Inject 5 mg into the skin once a week.   Nexlizet 180-10 MG Tabs Generic drug: Bempedoic Acid-Ezetimibe TAKE 1 TABLET BY MOUTH AT BEDTIME FOR CHOLESTEROL   nortriptyline  25 MG capsule Commonly known as: PAMELOR  TAKE 1 CAPSULE BY MOUTH AT BEDTIME   NovoLOG  Mix 70/30 FlexPen (70-30) 100 UNIT/ML FlexPen Generic drug: insulin  aspart protamine - aspart INJECT 15 UNITS SUBCUTANEOUSLY ONCE EVERY MORNING AS DIRECTED *STOP OTHER INSULINS   omeprazole  40 MG capsule Commonly known as: PRILOSEC TAKE 1 CAPSULE BY MOUTH TWICE DAILY FOR REFULX   polyethylene glycol powder 17 GM/SCOOP powder Commonly known as: GLYCOLAX /MIRALAX  MIX 17 gms (1 CAPFUL) IN 8oz OF WATER OR JUICE AND DRINK ONCE DAILY.   rosuvastatin  20 MG tablet Commonly known as: CRESTOR  TAKE 1 TABLET BY MOUTH ONCE EVERY EVENING   sulfamethoxazole -trimethoprim  400-80 MG tablet Commonly known as: Bactrim  Take 1 tablet by mouth 2 (two) times daily.   Vascepa  0.5 g Caps Generic drug: Icosapent  Ethyl Take 4 capsules by mouth in the morning and at bedtime.   Zenpep  40000-126000 units Cpep Generic drug: Pancrelipase  (Lip-Prot-Amyl) TAKE 2 CAPSULES BY MOUTH WITH MEALS AND 1 WITH SNACKS   zolpidem  10 MG tablet Commonly known as: AMBIEN  TAKE 1 TABLET BY MOUTH AT BEDTIME AS NEEDED FOR INSOMNIA        Allergies:  Allergies  Allergen Reactions   Metoclopramide  Other (See Comments) and Shortness Of Breath    Hypotension, shortness of breath  Hypotension, shortness of  breath  Hypotension, shortness of breath  Hypotension, shortness of breath    Other Reaction(s): Other (See Comments)    Hypotension, shortness of breath  Hypotension, shortness of breath Hypotension, shortness of breath  Shortness of Breath, Hypotension   Penicillins Anaphylaxis, Rash and Swelling    Has patient had a PCN reaction causing immediate rash, facial/tongue/throat swelling, SOB or lightheadedness with hypotension: Yes  Has patient had a PCN reaction causing severe rash involving mucus membranes or skin necrosis: No  Has patient had a PCN reaction that required hospitalization No  Has patient had a PCN reaction occurring within the last 10 years:  No  If all of the above answers are NO, then may proceed with Cephalosporin use.  Has patient had a PCN reaction causing immediate rash, facial/tongue/throat swelling, SOB or lightheadedness with hypotension: Yes  Has patient had a PCN reaction causing severe rash involving mucus membranes or skin necrosis: No  Has patient had a PCN reaction that required hospitalization No  Has patient had a PCN reaction occurring within the last 10 years: No  If all of the above answers are NO, then may proceed with Cephalosporin use.  Has patient had a PCN reaction causing immediate rash, facial/tongue/throat swelling, SOB or lightheadedness with hypotension: Yes  Has patient had a PCN reaction causing severe rash involving mucus membranes or skin necrosis: No  Has patient had a PCN reaction that required hospitalization No  Has patient had a PCN reaction occurring within the last 10 years: No  If all of the above answers are NO, then may proceed with Cephalosporin use.  Has patient had a PCN reaction causing immediate rash, facial/tongue/throat swelling, SOB or lightheadedness with hypotension: Yes Has patient had a PCN reaction causing severe rash involving mucus membranes or skin necrosis: No Has patient had a PCN reaction that required  hospitalization No Has patient had a PCN reaction occurring within the last 10 years: No If all of the above answers are NO, then may proceed with Cephalosporin use.   Fentanyl  Rash    Rash   Other Rash    Bee sting   Bee sting  Bee sting    Bee sting   Bee sting   Oxycodone -Acetaminophen  Rash    plain tylenol  can also make itch?  plain tylenol  can also make itch?  plain tylenol  can also make itch?  plain tylenol  can also make itch?    plain tylenol  can also make itch?  plain tylenol  can also make itch? plain tylenol  can also make itch?   Percocet [Oxycodone -Acetaminophen ] Rash    plain tylenol  can also make itch?    Family History: Family History  Problem Relation Age of Onset   Diabetes Mother     Social History:   reports that she has quit smoking. Her smoking use included cigarettes. She has never used smokeless tobacco. She reports current alcohol use of about 1.0 standard drink of alcohol per week. She reports that she does not use drugs.  Physical Exam: BP 106/64   Pulse 80   Ht 5' 1 (1.549 m)   Wt 99 lb (44.9 kg)   BMI 18.71 kg/m   Constitutional:  Alert and oriented, no acute distress, nontoxic appearing HEENT: [place], AT Cardiovascular: No clubbing, cyanosis, or edema Respiratory: Normal respiratory effort, no increased work of breathing GU: Suprapubic catheter in place draining clear, yellow urine.  It is on significant tension and pulling at her bladder. Skin: No rashes, bruises or suspicious lesions Neurologic: Grossly intact, no focal deficits, moving all 4 extremities Psychiatric: Normal mood and affect  Laboratory Data: Results for orders placed or performed in visit on 07/01/23  BLADDER SCAN AMB NON-IMAGING   Collection Time: 07/01/23  3:43 PM  Result Value Ref Range   Scan Result 0ml    Bladder Irrigation  Due to Foley dysfunction patient is present today for a bladder irrigation. Patient was cleaned and prepped in a sterile fashion. 60 mL of  saline was instilled into the bladder with a 70mL Toomey syringe through the catheter in place.  60mL of urine return was cleared from the bladder and the  catheter irrigated easily. Upon completion, the catheter was draining well and was reattached to the leg bag for drainage. Patient tolerated well.   Performed by: Kora Groom, PA-C   Assessment & Plan:   1. Problem with Foley catheter, initial encounter (HCC) (Primary) Catheter irrigated easily today, I suspect intermittent issues with drainage had to do with keeping the catheter on tension and possibly crimping the tube.  I showed her how to attach the SP tube to her StatLock to take keep slack on the tubing for comfort and to maximize drainage. - BLADDER SCAN AMB NON-IMAGING  2. Bladder spasms She has failed oxybutynin  due to anticholinergic side effects.  Will try Myrbetriq 50 mg as an alternative. - mirabegron ER (MYRBETRIQ) 50 MG TB24 tablet; Take 1 tablet (50 mg total) by mouth daily.  Dispense: 30 tablet; Refill: 11   Return in about 2 weeks (around 07/15/2023) for SPT exchange.  Lucie Hones, PA-C  [facility] [place] [address], [address] [place], [place] [address] [phone]

## 2023-07-05 ENCOUNTER — Other Ambulatory Visit: Payer: Self-pay | Admitting: Family

## 2023-07-06 ENCOUNTER — Telehealth: Payer: Self-pay

## 2023-07-06 NOTE — Telephone Encounter (Signed)
 [facility] Pharmacist is requesting that the patient get a Occupational hygienist, Receiver and Sensor  to improve monitoring of glycemic control please advise

## 2023-07-08 ENCOUNTER — Other Ambulatory Visit: Payer: Self-pay

## 2023-07-08 MED ORDER — DEXCOM G6 SENSOR MISC: 1.0000 | 0 refills | Status: AC | PRN

## 2023-07-08 MED ORDER — DEXCOM G6 TRANSMITTER MISC: 1.0000 | Freq: Every day | 0 refills | Status: AC

## 2023-07-08 MED ORDER — DEXCOM G6 RECEIVER DEVI: 1.0000 | Freq: Every day | 0 refills | Status: AC

## 2023-07-08 NOTE — Telephone Encounter (Signed)
 Pended to the pcp to send for pt

## 2023-07-13 ENCOUNTER — Encounter: Payer: Self-pay | Admitting: Family

## 2023-07-13 ENCOUNTER — Ambulatory Visit: Admitting: Family

## 2023-07-13 VITALS — BP 126/60 | HR 54 | Ht 61.0 in | Wt 96.2 lb

## 2023-07-13 DIAGNOSIS — R300 Dysuria: Secondary | ICD-10-CM

## 2023-07-13 DIAGNOSIS — N760 Acute vaginitis: Secondary | ICD-10-CM | POA: Diagnosis not present

## 2023-07-13 DIAGNOSIS — I10 Essential (primary) hypertension: Secondary | ICD-10-CM

## 2023-07-13 DIAGNOSIS — E1169 Type 2 diabetes mellitus with other specified complication: Secondary | ICD-10-CM

## 2023-07-13 DIAGNOSIS — E782 Mixed hyperlipidemia: Secondary | ICD-10-CM

## 2023-07-13 DIAGNOSIS — R5383 Other fatigue: Secondary | ICD-10-CM

## 2023-07-13 DIAGNOSIS — N1832 Chronic kidney disease, stage 3b: Secondary | ICD-10-CM

## 2023-07-13 DIAGNOSIS — E538 Deficiency of other specified B group vitamins: Secondary | ICD-10-CM

## 2023-07-13 DIAGNOSIS — K219 Gastro-esophageal reflux disease without esophagitis: Secondary | ICD-10-CM | POA: Diagnosis not present

## 2023-07-13 DIAGNOSIS — E559 Vitamin D deficiency, unspecified: Secondary | ICD-10-CM

## 2023-07-13 LAB — POCT CBG (FASTING - GLUCOSE)-MANUAL ENTRY: Glucose Fasting, POC: 162 mg/dL — AB (ref 70–99)

## 2023-07-13 LAB — POC CREATINE & ALBUMIN,URINE
Albumin/Creatinine Ratio, Urine, POC: >300
Creatinine, POC: 50 mg/dL
Microalbumin Ur, POC: 150 mg/L

## 2023-07-13 LAB — POCT URINALYSIS DIPSTICK
Bilirubin, UA: NEGATIVE
Glucose, UA: POSITIVE — AB
Ketones, UA: NEGATIVE
Leukocytes, UA: NEGATIVE
Nitrite, UA: NEGATIVE
Protein, UA: POSITIVE — AB
Spec Grav, UA: 1.015 (ref 1.010–1.025)
Urobilinogen, UA: 0.2 U/dL
pH, UA: 5.5 (ref 5.0–8.0)

## 2023-07-13 NOTE — Assessment & Plan Note (Signed)
 Patient is seen by Nephrology, who manage this condition.  She is well controlled with current therapy.   Will defer to them for further changes to plan of care.

## 2023-07-13 NOTE — Assessment & Plan Note (Signed)
 Checking labs today. Will call pt. With results  Continue current diabetes POC, as patient has been well controlled on current regimen.  Will adjust meds if needed based on labs.   -CBC w/Diff -CMP w/eGFR -Hemoglobin A1C

## 2023-07-13 NOTE — Assessment & Plan Note (Signed)
 Blood pressure well controlled with current medications.  Continue current therapy.  Will reassess at follow up.   - CBC w/Diff - CMP w/eGFR

## 2023-07-13 NOTE — Assessment & Plan Note (Signed)
 Checking labs today.  Continue current therapy for lipid control. Will modify as needed based on labwork results.   -CMP w/eGFR -Lipid Panel

## 2023-07-13 NOTE — Assessment & Plan Note (Signed)
 Patient stable.  Well controlled with current therapy.   Continue current meds.

## 2023-07-13 NOTE — Progress Notes (Signed)
 Established Patient Office Visit  Subjective:  Patient ID: Karen [name], female    DOB: 1956/01/02  Age: 68 y.o. MRN: [id number]  Chief Complaint  Patient presents with   Follow-up    3 month follow up    Patient is here today for her 3 months follow up.  She has been feeling fairly well since last appointment.   She does have additional concerns to discuss today.  Blood pressure has been low, patient has been holding her hydrochlorothiazide .   Labs are due today. She needs refills.   I have reviewed her active problem list, medication list, allergies, notes from last encounter, lab results for her appointment today.       No other concerns at this time.   Past Medical History:  Diagnosis Date   Abdominal pain    Acute cystitis 05/18/2020   Acute kidney injury superimposed on CKD (HCC)    Acute on chronic pancreatitis (HCC) 01/03/2017   Acute renal insufficiency 10/29/2015   Adhesive capsulitis of left shoulder    Alcoholic pancreatitis    Asthma    Depression    Diabetes mellitus without complication (HCC)    Diabetic cataract of right eye (HCC) 08/31/2019   Formatting of this note might be different from the original. Added automatically from request for surgery [id number]   DKA (diabetic ketoacidoses) 02/17/2015   DKA (diabetic ketoacidosis) (HCC) 02/17/2015   IMO SNOMED Dx Update Oct 2024     Elevated alkaline phosphatase level 10/30/2021   Emphysematous cystitis    Esophageal candidiasis (HCC)    ETOH abuse 01/03/2017   Headache 02/26/2019   Hypercholesteremia    Hypertension    Hypertensive urgency 04/21/2016   Hypokalemia    Left flank pain 10/29/2015   Left upper quadrant abdominal pain 05/18/2020   Leukocytosis 06/13/2019   Malignant essential hypertension 10/29/2015   Upper GI bleed 11/11/2017   UTI (urinary tract infection) 01/24/2018    Past Surgical History:  Procedure Laterality Date   ABDOMINAL HYSTERECTOMY  1996   APPENDECTOMY  1997    CYSTOSCOPY WITH FULGERATION N/A 05/27/2023   Procedure: CYSTOSCOPY, WITH BLADDER FULGURATION and clot evacuation;  Surgeon: Francisca Redell BROCKS, MD;  Location: [facility] ORS;  Service: Urology;  Laterality: N/A;   ESOPHAGOGASTRODUODENOSCOPY N/A 11/12/2017   Procedure: ESOPHAGOGASTRODUODENOSCOPY (EGD);  Surgeon: Unk Corinn Skiff, MD;  Location: [facility];  Service: Gastroenterology;  Laterality: N/A;   ESOPHAGOGASTRODUODENOSCOPY N/A 09/29/2018   Procedure: ESOPHAGOGASTRODUODENOSCOPY (EGD);  Surgeon: Toledo, Ladell POUR, MD;  Location: [facility] ENDOSCOPY;  Service: Gastroenterology;  Laterality: N/A;   ESOPHAGOGASTRODUODENOSCOPY (EGD) WITH PROPOFOL  N/A 04/24/2016   Procedure: ESOPHAGOGASTRODUODENOSCOPY (EGD) WITH PROPOFOL ;  Surgeon: Rogelia Copping, MD;  Location: [facility] ENDOSCOPY;  Service: Endoscopy;  Laterality: N/A;   ESOPHAGOGASTRODUODENOSCOPY (EGD) WITH PROPOFOL  N/A 01/28/2019   Procedure: ESOPHAGOGASTRODUODENOSCOPY (EGD) WITH PROPOFOL ;  Surgeon: Toledo, Ladell POUR, MD;  Location: [facility] ENDOSCOPY;  Service: Gastroenterology;  Laterality: N/A;   EYE SURGERY     HAND SURGERY  1988   IR CYSTOSTOMY TUBE PLACEMENT/BLADDER ASPIRATION  05/20/2023   THYROID  SURGERY  2013    Social History   Socioeconomic History   Marital status: Significant Other    Spouse name: Not on file   Number of children: 2   Years of education: Not on file   Highest education level: Not on file  Occupational History   Occupation: disabled  Tobacco Use   Smoking status: Former    Types: Cigarettes   Smokeless tobacco: Never  Tobacco comments:    quit 7 years ago   Vaping Use   Vaping status: Never Used  Substance and Sexual Activity   Alcohol use: Yes    Alcohol/week: 1.0 standard drink of alcohol    Types: 1 Glasses of wine per week    Comment: rare   Drug use: No   Sexual activity: Not Currently  Other Topics Concern   Not on file  Social History Narrative   Lives with Cordella Molt, TERRACE Chyle, great  grandkids, & Great/great grandkids   Social Drivers of Health   Financial Resource Strain: Low Risk  (10/31/2021)   Received [facility]   Overall Financial Resource Strain (CARDIA)    Difficulty of Paying Living Expenses: Not hard at all  Food Insecurity: No Food Insecurity (05/21/2023)   Hunger Vital Sign    Worried About Running Out of Food in the Last Year: Never true    Ran Out of Food in the Last Year: Never true  Transportation Needs: No Transportation Needs (05/21/2023)   PRAPARE - Administrator, Civil Service (Medical): No    Lack of Transportation (Non-Medical): No  Physical Activity: Not on file  Stress: Not on file  Social Connections: Socially Isolated (05/21/2023)   Social Connection and Isolation Panel    Frequency of Communication with Friends and Family: Three times a week    Frequency of Social Gatherings with Friends and Family: Once a week    Attends Religious Services: Never    Database administrator or Organizations: No    Attends Banker Meetings: Never    Marital Status: Divorced  Catering manager Violence: Not At Risk (05/21/2023)   Humiliation, Afraid, Rape, and Kick questionnaire    Fear of Current or Ex-Partner: No    Emotionally Abused: No    Physically Abused: No    Sexually Abused: No    Family History  Problem Relation Age of Onset   Diabetes Mother     Allergies  Allergen Reactions   Metoclopramide  Other (See Comments) and Shortness Of Breath    Hypotension, shortness of breath  Hypotension, shortness of breath  Hypotension, shortness of breath  Hypotension, shortness of breath    Other Reaction(s): Other (See Comments)    Hypotension, shortness of breath  Hypotension, shortness of breath Hypotension, shortness of breath  Shortness of Breath, Hypotension   Penicillins Anaphylaxis, Rash and Swelling    Has patient had a PCN reaction causing immediate rash, facial/tongue/throat swelling, SOB or  lightheadedness with hypotension: Yes  Has patient had a PCN reaction causing severe rash involving mucus membranes or skin necrosis: No  Has patient had a PCN reaction that required hospitalization No  Has patient had a PCN reaction occurring within the last 10 years: No  If all of the above answers are NO, then may proceed with Cephalosporin use.  Has patient had a PCN reaction causing immediate rash, facial/tongue/throat swelling, SOB or lightheadedness with hypotension: Yes  Has patient had a PCN reaction causing severe rash involving mucus membranes or skin necrosis: No  Has patient had a PCN reaction that required hospitalization No  Has patient had a PCN reaction occurring within the last 10 years: No  If all of the above answers are NO, then may proceed with Cephalosporin use.  Has patient had a PCN reaction causing immediate rash, facial/tongue/throat swelling, SOB or lightheadedness with hypotension: Yes  Has patient had a PCN reaction causing  severe rash involving mucus membranes or skin necrosis: No  Has patient had a PCN reaction that required hospitalization No  Has patient had a PCN reaction occurring within the last 10 years: No  If all of the above answers are NO, then may proceed with Cephalosporin use.  Has patient had a PCN reaction causing immediate rash, facial/tongue/throat swelling, SOB or lightheadedness with hypotension: Yes Has patient had a PCN reaction causing severe rash involving mucus membranes or skin necrosis: No Has patient had a PCN reaction that required hospitalization No Has patient had a PCN reaction occurring within the last 10 years: No If all of the above answers are NO, then may proceed with Cephalosporin use.   Fentanyl  Rash    Rash   Other Rash    Bee sting   Bee sting  Bee sting    Bee sting   Bee sting   Oxycodone -Acetaminophen  Rash    plain tylenol  can also make itch?  plain tylenol  can also make itch?  plain tylenol  can also  make itch?  plain tylenol  can also make itch?    plain tylenol  can also make itch?  plain tylenol  can also make itch? plain tylenol  can also make itch?   Percocet [Oxycodone -Acetaminophen ] Rash    plain tylenol  can also make itch?    Review of Systems  Neurological:  Positive for dizziness.  All other systems reviewed and are negative.      Objective:   BP 126/60   Pulse (!) 54   Ht 5' 1 (1.549 m)   Wt 96 lb 3.2 oz (43.6 kg)   SpO2 99%   BMI 18.18 kg/m   Vitals:   07/13/23 0943  BP: 126/60  Pulse: (!) 54  Height: 5' 1 (1.549 m)  Weight: 96 lb 3.2 oz (43.6 kg)  SpO2: 99%  BMI (Calculated): 18.19    Physical Exam Vitals and nursing note reviewed.  Constitutional:      Appearance: Normal appearance. She is normal weight.  HENT:     Head: Normocephalic.  Eyes:     Extraocular Movements: Extraocular movements intact.     Conjunctiva/sclera: Conjunctivae normal.     Pupils: Pupils are equal, round, and reactive to light.  Cardiovascular:     Rate and Rhythm: Normal rate.  Pulmonary:     Effort: Pulmonary effort is normal.  Neurological:     General: No focal deficit present.     Mental Status: She is alert and oriented to person, place, and time. Mental status is at baseline.  Psychiatric:        Mood and Affect: Mood normal.        Behavior: Behavior normal.        Thought Content: Thought content normal.        Judgment: Judgment normal.      Results for orders placed or performed in visit on 07/13/23  POCT CBG (Fasting - Glucose)  Result Value Ref Range   Glucose Fasting, POC 162 (A) 70 - 99 mg/dL    Recent Results (from the past 2160 hours)  Urine Culture     Status: None   Collection Time: 04/14/23 10:17 AM   Specimen: Urine, Clean Catch  Result Value Ref Range   Specimen Description      URINE, CLEAN CATCH Performed at [facility], [address] [place], [place] [address]    Special Requests      NONE Performed at [facility], [address] [place], [place] [address]    Culture      NO GROWTH Performed at [facility], [address] [place], [place] [address]    Report Status 04/15/2023 FINAL   Lipase, blood     Status: Abnormal   Collection Time: 04/28/23  2:10 PM  Result Value Ref Range   Lipase 78 (H) 11 - 51 U/L    Comment: Performed at [facility], [address] [place], [place] [address]  Comprehensive metabolic panel     Status: Abnormal   Collection Time: 04/28/23  2:10 PM  Result Value Ref Range   Sodium 136 135 - 145 mmol/L   Potassium 4.3 3.5 - 5.1 mmol/L   Chloride 102 98 - 111 mmol/L   CO2 21 (L) 22 - 32 mmol/L   Glucose, Bld 183 (H) 70 - 99 mg/dL    Comment: Glucose reference range applies only to samples taken after fasting for at least 8 hours.   BUN 26 (H) 8 - 23 mg/dL   Creatinine, Ser 8.19 (H) 0.44 - 1.00 mg/dL   Calcium  9.4 8.9 - 10.3 mg/dL   Total Protein 7.8 6.5 - 8.1 g/dL   Albumin 3.7 3.5 - 5.0 g/dL   AST 51 (H) 15 - 41 U/L   ALT 37 0 - 44 U/L   Alkaline Phosphatase 329 (H) 38 - 126 U/L   Total Bilirubin 0.5 0.0 - 1.2 mg/dL   GFR, Estimated 31 (L) >60 mL/min    Comment: (NOTE) Calculated using the CKD-EPI Creatinine Equation (2021)    Anion gap 13 5 - 15    Comment: Performed at [facility], [address] [place], [place] [address]  CBC     Status: None   Collection Time: 04/28/23  2:10 PM  Result Value Ref Range   WBC 9.8 4.0 - 10.5 K/uL   RBC 4.25 3.87 - 5.11 MIL/uL   Hemoglobin 12.8 12.0 - 15.0 g/dL   HCT 62.6 63.9 - 53.9 %   MCV 87.8 80.0 - 100.0 fL   MCH 30.1 26.0 - 34.0 pg   MCHC 34.3 30.0 - 36.0 g/dL   RDW 87.8 88.4 - 84.4 %   Platelets 299 150 - 400 K/uL   nRBC 0.0 0.0 - 0.2 %    Comment: Performed at [facility], [address] [place], [place] [address]  Urinalysis, Routine w reflex microscopic -Urine, Clean Catch     Status: Abnormal   Collection Time: 04/28/23  4:16 PM  Result Value Ref  Range   Color, Urine STRAW (A) YELLOW   APPearance HAZY (A) CLEAR   Specific Gravity, Urine 1.010 1.005 - 1.030   pH 5.0 5.0 - 8.0   Glucose, UA >=500 (A) NEGATIVE mg/dL   Hgb urine dipstick SMALL (A) NEGATIVE   Bilirubin Urine NEGATIVE NEGATIVE   Ketones, ur NEGATIVE NEGATIVE mg/dL   Protein, ur >=699 (A) NEGATIVE mg/dL   Nitrite POSITIVE (A) NEGATIVE   Leukocytes,Ua NEGATIVE NEGATIVE   RBC / HPF 6-10 0 - 5 RBC/hpf   WBC, UA 6-10 0 - 5 WBC/hpf   Bacteria, UA FEW (A) NONE SEEN   Squamous Epithelial / HPF 0-5 0 - 5 /HPF   Mucus PRESENT    Budding Yeast PRESENT     Comment: Performed at [facility], [address], [place], [place] [address]  CBC with Differential/Platelet     Status: None   Collection Time: 05/20/23 10:13 AM  Result  Value Ref Range   WBC 7.8 4.0 - 10.5 K/uL   RBC 4.13 3.87 - 5.11 MIL/uL   Hemoglobin 12.5 12.0 - 15.0 g/dL   HCT 63.6 63.9 - 53.9 %   MCV 87.9 80.0 - 100.0 fL   MCH 30.3 26.0 - 34.0 pg   MCHC 34.4 30.0 - 36.0 g/dL   RDW 88.0 88.4 - 84.4 %   Platelets 287 150 - 400 K/uL   nRBC 0.0 0.0 - 0.2 %   Neutrophils Relative % 57 %   Neutro Abs 4.5 1.7 - 7.7 K/uL   Lymphocytes Relative 33 %   Lymphs Abs 2.6 0.7 - 4.0 K/uL   Monocytes Relative 7 %   Monocytes Absolute 0.6 0.1 - 1.0 K/uL   Eosinophils Relative 1 %   Eosinophils Absolute 0.1 0.0 - 0.5 K/uL   Basophils Relative 1 %   Basophils Absolute 0.1 0.0 - 0.1 K/uL   Immature Granulocytes 1 %   Abs Immature Granulocytes 0.06 0.00 - 0.07 K/uL    Comment: Performed at [facility], [address] [place], [place] [address]  Protime-INR     Status: None   Collection Time: 05/20/23 10:13 AM  Result Value Ref Range   Prothrombin Time 13.0 11.4 - 15.2 seconds   INR 1.0 0.8 - 1.2    Comment: (NOTE) INR goal varies based on device and disease states. Performed at [facility] [address] [place], [place] [address]   Glucose, capillary     Status: Abnormal    Collection Time: 05/20/23 10:20 AM  Result Value Ref Range   Glucose-Capillary 203 (H) 70 - 99 mg/dL    Comment: Glucose reference range applies only to samples taken after fasting for at least 8 hours.  CBC with Differential     Status: Abnormal   Collection Time: 05/21/23  8:15 AM  Result Value Ref Range   WBC 17.3 (H) 4.0 - 10.5 K/uL   RBC 3.52 (L) 3.87 - 5.11 MIL/uL   Hemoglobin 10.7 (L) 12.0 - 15.0 g/dL   HCT 68.5 (L) 63.9 - 53.9 %   MCV 89.2 80.0 - 100.0 fL   MCH 30.4 26.0 - 34.0 pg   MCHC 34.1 30.0 - 36.0 g/dL   RDW 87.7 88.4 - 84.4 %   Platelets 257 150 - 400 K/uL   nRBC 0.0 0.0 - 0.2 %   Neutrophils Relative % 82 %   Neutro Abs 14.1 (H) 1.7 - 7.7 K/uL   Lymphocytes Relative 10 %   Lymphs Abs 1.8 0.7 - 4.0 K/uL   Monocytes Relative 7 %   Monocytes Absolute 1.2 (H) 0.1 - 1.0 K/uL   Eosinophils Relative 0 %   Eosinophils Absolute 0.0 0.0 - 0.5 K/uL   Basophils Relative 0 %   Basophils Absolute 0.1 0.0 - 0.1 K/uL   Immature Granulocytes 1 %   Abs Immature Granulocytes 0.13 (H) 0.00 - 0.07 K/uL    Comment: Performed at [facility], [address], [place], [place] [address]  Basic metabolic panel     Status: Abnormal   Collection Time: 05/21/23  8:15 AM  Result Value Ref Range   Sodium 134 (L) 135 - 145 mmol/L   Potassium 4.5 3.5 - 5.1 mmol/L   Chloride 102 98 - 111 mmol/L   CO2 21 (L) 22 - 32 mmol/L   Glucose, Bld 228 (H) 70 - 99 mg/dL    Comment: Glucose reference range applies only to samples  taken after fasting for at least 8 hours.   BUN 33 (H) 8 - 23 mg/dL   Creatinine, Ser 7.45 (H) 0.44 - 1.00 mg/dL   Calcium  8.7 (L) 8.9 - 10.3 mg/dL   GFR, Estimated 20 (L) >60 mL/min    Comment: (NOTE) Calculated using the CKD-EPI Creatinine Equation (2021)    Anion gap 11 5 - 15    Comment: Performed at [facility], [address] [place], [place] [address]  Glucose, capillary     Status: Abnormal   Collection Time: 05/21/23  5:54 PM  Result Value  Ref Range   Glucose-Capillary 220 (H) 70 - 99 mg/dL    Comment: Glucose reference range applies only to samples taken after fasting for at least 8 hours.  Urinalysis, Complete w Microscopic -Urine, Clean Catch     Status: Abnormal   Collection Time: 05/21/23  6:00 PM  Result Value Ref Range   Color, Urine STRAW (A) YELLOW   APPearance HAZY (A) CLEAR   Specific Gravity, Urine 1.004 (L) 1.005 - 1.030   pH 5.0 5.0 - 8.0   Glucose, UA NEGATIVE NEGATIVE mg/dL   Hgb urine dipstick LARGE (A) NEGATIVE   Bilirubin Urine NEGATIVE NEGATIVE   Ketones, ur NEGATIVE NEGATIVE mg/dL   Protein, ur 30 (A) NEGATIVE mg/dL   Nitrite NEGATIVE NEGATIVE   Leukocytes,Ua NEGATIVE NEGATIVE   RBC / HPF >50 0 - 5 RBC/hpf   WBC, UA 11-20 0 - 5 WBC/hpf   Bacteria, UA RARE (A) NONE SEEN   Squamous Epithelial / HPF 0-5 0 - 5 /HPF    Comment: Performed at [facility], [address] [place], [place] [address]  Glucose, capillary     Status: Abnormal   Collection Time: 05/21/23  9:04 PM  Result Value Ref Range   Glucose-Capillary 190 (H) 70 - 99 mg/dL    Comment: Glucose reference range applies only to samples taken after fasting for at least 8 hours.  Basic metabolic panel     Status: Abnormal   Collection Time: 05/22/23  5:25 AM  Result Value Ref Range   Sodium 134 (L) 135 - 145 mmol/L   Potassium 4.4 3.5 - 5.1 mmol/L   Chloride 102 98 - 111 mmol/L   CO2 20 (L) 22 - 32 mmol/L   Glucose, Bld 184 (H) 70 - 99 mg/dL    Comment: Glucose reference range applies only to samples taken after fasting for at least 8 hours.   BUN 37 (H) 8 - 23 mg/dL   Creatinine, Ser 7.47 (H) 0.44 - 1.00 mg/dL   Calcium  8.3 (L) 8.9 - 10.3 mg/dL   GFR, Estimated 20 (L) >60 mL/min    Comment: (NOTE) Calculated using the CKD-EPI Creatinine Equation (2021)    Anion gap 12 5 - 15    Comment: Performed at [facility], [address] [place], [place] [address]  CBC     Status: Abnormal   Collection Time: 05/22/23   5:25 AM  Result Value Ref Range   WBC 8.3 4.0 - 10.5 K/uL   RBC 2.86 (L) 3.87 - 5.11 MIL/uL   Hemoglobin 8.8 (L) 12.0 - 15.0 g/dL   HCT 73.3 (L) 63.9 - 53.9 %   MCV 93.0 80.0 - 100.0 fL   MCH 30.8 26.0 - 34.0 pg   MCHC 33.1 30.0 - 36.0 g/dL   RDW 87.7 88.4 - 84.4 %   Platelets 192 150 - 400 K/uL   nRBC 0.0 0.0 -  0.2 %    Comment: Performed at [facility], [address] [place], [place] [address]  Glucose, capillary     Status: Abnormal   Collection Time: 05/22/23  7:35 AM  Result Value Ref Range   Glucose-Capillary 155 (H) 70 - 99 mg/dL    Comment: Glucose reference range applies only to samples taken after fasting for at least 8 hours.  Glucose, capillary     Status: Abnormal   Collection Time: 05/22/23 11:32 AM  Result Value Ref Range   Glucose-Capillary 125 (H) 70 - 99 mg/dL    Comment: Glucose reference range applies only to samples taken after fasting for at least 8 hours.  Hemoglobin and hematocrit, blood     Status: Abnormal   Collection Time: 05/22/23  3:25 PM  Result Value Ref Range   Hemoglobin 8.9 (L) 12.0 - 15.0 g/dL   HCT 73.5 (L) 63.9 - 53.9 %    Comment: Performed at [facility], [address] [place], [place] [address]  Glucose, capillary     Status: Abnormal   Collection Time: 05/22/23  4:13 PM  Result Value Ref Range   Glucose-Capillary 134 (H) 70 - 99 mg/dL    Comment: Glucose reference range applies only to samples taken after fasting for at least 8 hours.  Glucose, capillary     Status: Abnormal   Collection Time: 05/22/23  9:14 PM  Result Value Ref Range   Glucose-Capillary 134 (H) 70 - 99 mg/dL    Comment: Glucose reference range applies only to samples taken after fasting for at least 8 hours.  CBC     Status: Abnormal   Collection Time: 05/23/23  5:17 AM  Result Value Ref Range   WBC 10.6 (H) 4.0 - 10.5 K/uL   RBC 2.46 (L) 3.87 - 5.11 MIL/uL   Hemoglobin 7.5 (L) 12.0 - 15.0 g/dL   HCT 77.3 (L) 63.9 - 53.9 %   MCV 91.9  80.0 - 100.0 fL   MCH 30.5 26.0 - 34.0 pg   MCHC 33.2 30.0 - 36.0 g/dL   RDW 88.0 88.4 - 84.4 %   Platelets 176 150 - 400 K/uL   nRBC 0.0 0.0 - 0.2 %    Comment: Performed at [facility], [address] [place], [place] [address]  Basic metabolic panel with GFR     Status: Abnormal   Collection Time: 05/23/23  5:17 AM  Result Value Ref Range   Sodium 133 (L) 135 - 145 mmol/L   Potassium 3.9 3.5 - 5.1 mmol/L   Chloride 103 98 - 111 mmol/L   CO2 15 (L) 22 - 32 mmol/L   Glucose, Bld 160 (H) 70 - 99 mg/dL    Comment: Glucose reference range applies only to samples taken after fasting for at least 8 hours.   BUN 28 (H) 8 - 23 mg/dL   Creatinine, Ser 7.80 (H) 0.44 - 1.00 mg/dL   Calcium  8.1 (L) 8.9 - 10.3 mg/dL   GFR, Estimated 24 (L) >60 mL/min    Comment: (NOTE) Calculated using the CKD-EPI Creatinine Equation (2021)    Anion gap 15 5 - 15    Comment: Performed at [facility], [address] [place], [place] [address]  Glucose, capillary     Status: Abnormal   Collection Time: 05/23/23  8:15 AM  Result Value Ref Range   Glucose-Capillary 144 (H) 70 - 99 mg/dL    Comment: Glucose reference range applies only to samples  taken after fasting for at least 8 hours.  Prepare RBC (crossmatch)     Status: None   Collection Time: 05/23/23  8:51 AM  Result Value Ref Range   Order Confirmation      ORDER PROCESSED BY BLOOD BANK Performed at [facility], [address] [place], [place] [address]   Type and screen [facility]     Status: None   Collection Time: 05/23/23  9:14 AM  Result Value Ref Range   ABO/RH(D) B POS    Antibody Screen NEG    Sample Expiration 05/26/2023,2359    Unit Number [id number]    Blood Component Type RBC LR PHER1    Unit division 00    Status of Unit ISSUED,FINAL    Transfusion Status OK TO TRANSFUSE    Crossmatch Result      Compatible Performed at [facility], [address]  [place], [place] [address]   BPAM RBC     Status: None   Collection Time: 05/23/23  9:14 AM  Result Value Ref Range   ISSUE DATE / TIME [id number]    Blood Product Unit Number [id number]    PRODUCT CODE [id number]    Unit Type and Rh 7300    Blood Product Expiration Date [id number]   Glucose, capillary     Status: Abnormal   Collection Time: 05/23/23 11:49 AM  Result Value Ref Range   Glucose-Capillary 281 (H) 70 - 99 mg/dL    Comment: Glucose reference range applies only to samples taken after fasting for at least 8 hours.   Comment 1 Notify RN    Comment 2 Document in Chart   Hemoglobin and hematocrit, blood     Status: Abnormal   Collection Time: 05/23/23  3:35 PM  Result Value Ref Range   Hemoglobin 10.1 (L) 12.0 - 15.0 g/dL    Comment: REPEATED TO VERIFY   HCT 29.8 (L) 36.0 - 46.0 %    Comment: Performed at [facility], [address] [place], [place] [address]  Glucose, capillary     Status: Abnormal   Collection Time: 05/23/23  4:29 PM  Result Value Ref Range   Glucose-Capillary 221 (H) 70 - 99 mg/dL    Comment: Glucose reference range applies only to samples taken after fasting for at least 8 hours.   Comment 1 Notify RN    Comment 2 Document in Chart   Glucose, capillary     Status: Abnormal   Collection Time: 05/23/23  9:17 PM  Result Value Ref Range   Glucose-Capillary 228 (H) 70 - 99 mg/dL    Comment: Glucose reference range applies only to samples taken after fasting for at least 8 hours.  Basic metabolic panel with GFR     Status: Abnormal   Collection Time: 05/24/23  5:31 AM  Result Value Ref Range   Sodium 133 (L) 135 - 145 mmol/L   Potassium 3.8 3.5 - 5.1 mmol/L   Chloride 103 98 - 111 mmol/L   CO2 19 (L) 22 - 32 mmol/L   Glucose, Bld 215 (H) 70 - 99 mg/dL    Comment: Glucose reference range applies only to samples taken after fasting for at least 8 hours.   BUN 24 (H) 8 - 23 mg/dL   Creatinine, Ser 8.00 (H) 0.44 - 1.00 mg/dL   Calcium  8.5  (L) 8.9 - 10.3 mg/dL   GFR, Estimated 27 (L) >60 mL/min    Comment: (NOTE) Calculated using the  CKD-EPI Creatinine Equation (2021)    Anion gap 11 5 - 15    Comment: Performed at [facility], [address] [place], [place] [address]  CBC     Status: Abnormal   Collection Time: 05/24/23  5:31 AM  Result Value Ref Range   WBC 8.4 4.0 - 10.5 K/uL   RBC 3.37 (L) 3.87 - 5.11 MIL/uL   Hemoglobin 10.1 (L) 12.0 - 15.0 g/dL   HCT 70.5 (L) 63.9 - 53.9 %   MCV 87.2 80.0 - 100.0 fL   MCH 30.0 26.0 - 34.0 pg   MCHC 34.4 30.0 - 36.0 g/dL   RDW 87.7 88.4 - 84.4 %   Platelets 181 150 - 400 K/uL   nRBC 0.0 0.0 - 0.2 %    Comment: Performed at [facility], [address] [place], [place] [address]  Glucose, capillary     Status: Abnormal   Collection Time: 05/24/23  9:16 AM  Result Value Ref Range   Glucose-Capillary 205 (H) 70 - 99 mg/dL    Comment: Glucose reference range applies only to samples taken after fasting for at least 8 hours.  Glucose, capillary     Status: Abnormal   Collection Time: 05/24/23 11:25 AM  Result Value Ref Range   Glucose-Capillary 309 (H) 70 - 99 mg/dL    Comment: Glucose reference range applies only to samples taken after fasting for at least 8 hours.  Glucose, capillary     Status: Abnormal   Collection Time: 05/24/23  5:47 PM  Result Value Ref Range   Glucose-Capillary 209 (H) 70 - 99 mg/dL    Comment: Glucose reference range applies only to samples taken after fasting for at least 8 hours.  Glucose, capillary     Status: Abnormal   Collection Time: 05/24/23  9:21 PM  Result Value Ref Range   Glucose-Capillary 238 (H) 70 - 99 mg/dL    Comment: Glucose reference range applies only to samples taken after fasting for at least 8 hours.  CBC     Status: Abnormal   Collection Time: 05/25/23  4:18 AM  Result Value Ref Range   WBC 7.5 4.0 - 10.5 K/uL   RBC 3.50 (L) 3.87 - 5.11 MIL/uL   Hemoglobin 10.5 (L) 12.0 - 15.0 g/dL   HCT 69.8 (L)  63.9 - 46.0 %   MCV 86.0 80.0 - 100.0 fL   MCH 30.0 26.0 - 34.0 pg   MCHC 34.9 30.0 - 36.0 g/dL   RDW 87.5 88.4 - 84.4 %   Platelets 221 150 - 400 K/uL   nRBC 0.0 0.0 - 0.2 %    Comment: Performed at [facility], [address], [place], [place] [address]  Basic metabolic panel with GFR     Status: Abnormal   Collection Time: 05/25/23  4:18 AM  Result Value Ref Range   Sodium 137 135 - 145 mmol/L   Potassium 3.7 3.5 - 5.1 mmol/L   Chloride 103 98 - 111 mmol/L   CO2 25 22 - 32 mmol/L   Glucose, Bld 192 (H) 70 - 99 mg/dL    Comment: Glucose reference range applies only to samples taken after fasting for at least 8 hours.   BUN 26 (H) 8 - 23 mg/dL   Creatinine, Ser 8.12 (H) 0.44 - 1.00 mg/dL   Calcium  9.0 8.9 - 10.3 mg/dL   GFR, Estimated 29 (L) >60 mL/min    Comment: (NOTE) Calculated using the CKD-EPI Creatinine  Equation (2021)    Anion gap 9 5 - 15    Comment: Performed at [facility], [address] [place], [place] [address]  Glucose, capillary     Status: Abnormal   Collection Time: 05/25/23 10:04 AM  Result Value Ref Range   Glucose-Capillary 245 (H) 70 - 99 mg/dL    Comment: Glucose reference range applies only to samples taken after fasting for at least 8 hours.  Glucose, capillary     Status: Abnormal   Collection Time: 05/25/23 12:41 PM  Result Value Ref Range   Glucose-Capillary 195 (H) 70 - 99 mg/dL    Comment: Glucose reference range applies only to samples taken after fasting for at least 8 hours.  Glucose, capillary     Status: Abnormal   Collection Time: 05/25/23  4:37 PM  Result Value Ref Range   Glucose-Capillary 100 (H) 70 - 99 mg/dL    Comment: Glucose reference range applies only to samples taken after fasting for at least 8 hours.   Comment 1 Notify RN   Glucose, capillary     Status: Abnormal   Collection Time: 05/25/23  8:43 PM  Result Value Ref Range   Glucose-Capillary 399 (H) 70 - 99 mg/dL    Comment: Glucose reference  range applies only to samples taken after fasting for at least 8 hours.  Basic metabolic panel with GFR     Status: Abnormal   Collection Time: 05/26/23  5:20 AM  Result Value Ref Range   Sodium 133 (L) 135 - 145 mmol/L   Potassium 3.6 3.5 - 5.1 mmol/L   Chloride 102 98 - 111 mmol/L   CO2 24 22 - 32 mmol/L   Glucose, Bld 141 (H) 70 - 99 mg/dL    Comment: Glucose reference range applies only to samples taken after fasting for at least 8 hours.   BUN 29 (H) 8 - 23 mg/dL   Creatinine, Ser 7.93 (H) 0.44 - 1.00 mg/dL   Calcium  8.4 (L) 8.9 - 10.3 mg/dL   GFR, Estimated 26 (L) >60 mL/min    Comment: (NOTE) Calculated using the CKD-EPI Creatinine Equation (2021)    Anion gap 7 5 - 15    Comment: Performed at [facility], [address] [place], [place] [address]  CBC     Status: Abnormal   Collection Time: 05/26/23  5:20 AM  Result Value Ref Range   WBC 9.4 4.0 - 10.5 K/uL   RBC 3.12 (L) 3.87 - 5.11 MIL/uL   Hemoglobin 9.2 (L) 12.0 - 15.0 g/dL   HCT 73.4 (L) 63.9 - 53.9 %   MCV 84.9 80.0 - 100.0 fL   MCH 29.5 26.0 - 34.0 pg   MCHC 34.7 30.0 - 36.0 g/dL   RDW 87.8 88.4 - 84.4 %   Platelets 223 150 - 400 K/uL   nRBC 0.0 0.0 - 0.2 %    Comment: Performed at [facility], [address] [place], [place] [address]  Glucose, capillary     Status: Abnormal   Collection Time: 05/26/23  7:50 AM  Result Value Ref Range   Glucose-Capillary 142 (H) 70 - 99 mg/dL    Comment: Glucose reference range applies only to samples taken after fasting for at least 8 hours.   Comment 1 Notify RN    Comment 2 Document in Chart   Glucose, capillary     Status: Abnormal   Collection Time: 05/26/23 12:06 PM  Result Value Ref Range  Glucose-Capillary 184 (H) 70 - 99 mg/dL    Comment: Glucose reference range applies only to samples taken after fasting for at least 8 hours.   Comment 1 Notify RN    Comment 2 Document in Chart   Glucose, capillary     Status: Abnormal   Collection  Time: 05/26/23  4:24 PM  Result Value Ref Range   Glucose-Capillary 107 (H) 70 - 99 mg/dL    Comment: Glucose reference range applies only to samples taken after fasting for at least 8 hours.   Comment 1 Notify RN    Comment 2 Document in Chart   Glucose, capillary     Status: Abnormal   Collection Time: 05/26/23 10:19 PM  Result Value Ref Range   Glucose-Capillary 233 (H) 70 - 99 mg/dL    Comment: Glucose reference range applies only to samples taken after fasting for at least 8 hours.  Basic metabolic panel with GFR     Status: Abnormal   Collection Time: 05/27/23  5:38 AM  Result Value Ref Range   Sodium 139 135 - 145 mmol/L   Potassium 3.2 (L) 3.5 - 5.1 mmol/L   Chloride 106 98 - 111 mmol/L   CO2 25 22 - 32 mmol/L   Glucose, Bld 135 (H) 70 - 99 mg/dL    Comment: Glucose reference range applies only to samples taken after fasting for at least 8 hours.   BUN 29 (H) 8 - 23 mg/dL   Creatinine, Ser 8.05 (H) 0.44 - 1.00 mg/dL   Calcium  8.6 (L) 8.9 - 10.3 mg/dL   GFR, Estimated 28 (L) >60 mL/min    Comment: (NOTE) Calculated using the CKD-EPI Creatinine Equation (2021)    Anion gap 8 5 - 15    Comment: Performed at [facility], [address] [place], [place] [address]  CBC     Status: Abnormal   Collection Time: 05/27/23  5:38 AM  Result Value Ref Range   WBC 9.8 4.0 - 10.5 K/uL   RBC 3.32 (L) 3.87 - 5.11 MIL/uL   Hemoglobin 9.8 (L) 12.0 - 15.0 g/dL   HCT 70.8 (L) 63.9 - 53.9 %   MCV 87.7 80.0 - 100.0 fL   MCH 29.5 26.0 - 34.0 pg   MCHC 33.7 30.0 - 36.0 g/dL   RDW 87.3 88.4 - 84.4 %   Platelets 306 150 - 400 K/uL   nRBC 0.0 0.0 - 0.2 %    Comment: Performed at [facility], [address], [place], [place] [address]  Magnesium     Status: None   Collection Time: 05/27/23  5:38 AM  Result Value Ref Range   Magnesium 2.2 1.7 - 2.4 mg/dL    Comment: Performed at [facility], [address] [place], [place] [address]  Glucose, capillary      Status: Abnormal   Collection Time: 05/27/23  7:51 AM  Result Value Ref Range   Glucose-Capillary 141 (H) 70 - 99 mg/dL    Comment: Glucose reference range applies only to samples taken after fasting for at least 8 hours.   Comment 1 Notify RN    Comment 2 Document in Chart   Glucose, capillary     Status: None   Collection Time: 05/27/23 10:25 AM  Result Value Ref Range   Glucose-Capillary 78 70 - 99 mg/dL    Comment: Glucose reference range applies only to samples taken after fasting for at least 8 hours.  Glucose, capillary  Status: None   Collection Time: 05/27/23 11:04 AM  Result Value Ref Range   Glucose-Capillary 74 70 - 99 mg/dL    Comment: Glucose reference range applies only to samples taken after fasting for at least 8 hours.  Glucose, capillary     Status: Abnormal   Collection Time: 05/27/23 11:34 AM  Result Value Ref Range   Glucose-Capillary 212 (H) 70 - 99 mg/dL    Comment: Glucose reference range applies only to samples taken after fasting for at least 8 hours.  Glucose, capillary     Status: Abnormal   Collection Time: 05/27/23 12:42 PM  Result Value Ref Range   Glucose-Capillary 203 (H) 70 - 99 mg/dL    Comment: Glucose reference range applies only to samples taken after fasting for at least 8 hours.  Glucose, capillary     Status: Abnormal   Collection Time: 05/27/23  1:29 PM  Result Value Ref Range   Glucose-Capillary 210 (H) 70 - 99 mg/dL    Comment: Glucose reference range applies only to samples taken after fasting for at least 8 hours.  Lipase, blood     Status: Abnormal   Collection Time: 06/08/23  2:47 PM  Result Value Ref Range   Lipase 62 (H) 11 - 51 U/L    Comment: Performed at [facility], [address] [place], [place] [address]  Comprehensive metabolic panel     Status: Abnormal   Collection Time: 06/08/23  2:47 PM  Result Value Ref Range   Sodium 134 (L) 135 - 145 mmol/L   Potassium 3.5 3.5 - 5.1 mmol/L   Chloride 102 98 -  111 mmol/L   CO2 20 (L) 22 - 32 mmol/L   Glucose, Bld 143 (H) 70 - 99 mg/dL    Comment: Glucose reference range applies only to samples taken after fasting for at least 8 hours.   BUN 36 (H) 8 - 23 mg/dL   Creatinine, Ser 7.95 (H) 0.44 - 1.00 mg/dL   Calcium  8.3 (L) 8.9 - 10.3 mg/dL   Total Protein 7.1 6.5 - 8.1 g/dL   Albumin 3.3 (L) 3.5 - 5.0 g/dL   AST 47 (H) 15 - 41 U/L   ALT 34 0 - 44 U/L   Alkaline Phosphatase 347 (H) 38 - 126 U/L   Total Bilirubin 0.6 0.0 - 1.2 mg/dL   GFR, Estimated 26 (L) >60 mL/min    Comment: (NOTE) Calculated using the CKD-EPI Creatinine Equation (2021)    Anion gap 12 5 - 15    Comment: Performed at [facility], [address] [place], [place] [address]  CBC     Status: Abnormal   Collection Time: 06/08/23  2:47 PM  Result Value Ref Range   WBC 6.0 4.0 - 10.5 K/uL   RBC 3.03 (L) 3.87 - 5.11 MIL/uL   Hemoglobin 9.1 (L) 12.0 - 15.0 g/dL   HCT 73.6 (L) 63.9 - 53.9 %   MCV 86.8 80.0 - 100.0 fL   MCH 30.0 26.0 - 34.0 pg   MCHC 34.6 30.0 - 36.0 g/dL   RDW 87.5 88.4 - 84.4 %   Platelets 251 150 - 400 K/uL   nRBC 0.0 0.0 - 0.2 %    Comment: Performed at [facility], [address] [place], [place] [address]  Urinalysis, Routine w reflex microscopic -Urine, Clean Catch     Status: Abnormal   Collection Time: 06/08/23  6:08 PM  Result Value Ref Range  Color, Urine YELLOW (A) YELLOW   APPearance CLOUDY (A) CLEAR   Specific Gravity, Urine 1.002 (L) 1.005 - 1.030   pH 6.0 5.0 - 8.0   Glucose, UA >=500 (A) NEGATIVE mg/dL   Hgb urine dipstick SMALL (A) NEGATIVE   Bilirubin Urine NEGATIVE NEGATIVE   Ketones, ur NEGATIVE NEGATIVE mg/dL   Protein, ur 899 (A) NEGATIVE mg/dL   Nitrite NEGATIVE NEGATIVE   Leukocytes,Ua LARGE (A) NEGATIVE   RBC / HPF 21-50 0 - 5 RBC/hpf   WBC, UA >50 0 - 5 WBC/hpf   Bacteria, UA MANY (A) NONE SEEN   Squamous Epithelial / HPF 0 0 - 5 /HPF   WBC Clumps PRESENT    Budding Yeast PRESENT     Comment:  Performed at [facility], [address], [place], [place] [address]  Urine Culture     Status: Abnormal   Collection Time: 06/08/23  6:08 PM   Specimen: Urine, Suprapubic  Result Value Ref Range   Specimen Description      URINE, SUPRAPUBIC Performed at [facility], [address], [place], [place] [address]    Special Requests      NONE Performed at [facility], [address] [place], [place] [address]    Culture (A)     >=100,000 COLONIES/mL ESCHERICHIA COLI Confirmed Extended Spectrum Beta-Lactamase Producer (ESBL).  In bloodstream infections from ESBL organisms, carbapenems are preferred over piperacillin/tazobactam. They are shown to have a lower risk of mortality.    Report Status 06/10/2023 FINAL    Organism ID, Bacteria ESCHERICHIA COLI (A)       Susceptibility   Escherichia coli - MIC*    AMPICILLIN >=32 RESISTANT Resistant     CEFAZOLIN >=64 RESISTANT Resistant     CEFTRIAXONE  >=64 RESISTANT Resistant     CIPROFLOXACIN  >=4 RESISTANT Resistant     GENTAMICIN <=1 SENSITIVE Sensitive     IMIPENEM <=0.25 SENSITIVE Sensitive     NITROFURANTOIN  32 SENSITIVE Sensitive     TRIMETH /SULFA  <=20 SENSITIVE Sensitive     AMPICILLIN/SULBACTAM >=32 RESISTANT Resistant     * >=100,000 COLONIES/mL ESCHERICHIA COLI  Urinalysis, Complete     Status: Abnormal   Collection Time: 06/17/23 12:59 PM  Result Value Ref Range   Specific Gravity, UA 1.020 1.005 - 1.030   pH, UA 6.0 5.0 - 7.5   Color, UA Yellow Yellow   Appearance Ur Slightly cloudy Clear   Leukocytes,UA 1+ (A) Negative   Protein,UA 3+ (A) Negative/Trace   Glucose, UA 2+ (A) Negative   Ketones, UA Trace (A) Negative   RBC, UA 3+ (A) Negative   Bilirubin, UA Negative Negative   Urobilinogen, Ur 0.2 0.2 - 1.0 mg/dL   Nitrite, UA Negative Negative   Microscopic Examination See below:     Comment: Microscopic was indicated and was performed.  CULTURE, URINE COMPREHENSIVE     Status:  Abnormal   Collection Time: 06/17/23 12:59 PM   Specimen: Urine   CU  Result Value Ref Range   Urine Culture, Comprehensive Final report (A)    Organism ID, Bacteria Escherichia coli (A)     Comment: Multi-Drug Resistant Organism 50,000-100,000 colony forming units per mL    ANTIMICROBIAL SUSCEPTIBILITY Comment     Comment:       ** S = Susceptible; I = Intermediate; R = Resistant **                    P = Positive; N =  Negative             MICS are expressed in micrograms per mL    Antibiotic                 RSLT#1    RSLT#2    RSLT#3    RSLT#4 Amoxicillin/Clavulanic Acid    I Ampicillin                     R Cefazolin                      R Cefepime                       I Cefoxitin                      I Cefpodoxime                    R Ceftriaxone                     R Ciprofloxacin                   R Ertapenem                      S Gentamicin                     S Levofloxacin                    R Meropenem                      S Nitrofurantoin                  S Piperacillin/Tazobactam        S Tetracycline                   S Tobramycin                     S Trimethoprim /Sulfa              S   Microscopic Examination     Status: Abnormal   Collection Time: 06/17/23 12:59 PM   Urine  Result Value Ref Range   WBC, UA >30 (A) 0 - 5 /hpf   RBC, Urine >30 (A) 0 - 2 /hpf   Epithelial Cells (non renal) 0-10 0 - 10 /hpf   Mucus, UA Present (A) Not Estab.   Bacteria, UA Many (A) None seen/Few  BLADDER SCAN AMB NON-IMAGING     Status: None   Collection Time: 07/01/23  3:43 PM  Result Value Ref Range   Scan Result 0ml   POCT CBG (Fasting - Glucose)     Status: Abnormal   Collection Time: 07/13/23  9:49 AM  Result Value Ref Range   Glucose Fasting, POC 162 (A) 70 - 99 mg/dL       Assessment & Plan Type 2 diabetes mellitus with hyperlipidemia (HCC) Checking labs today. Will call pt. With results  Continue current diabetes POC, as patient has been well controlled on  current regimen.  Will adjust meds if needed based on labs.   -CBC w/Diff -CMP w/eGFR -Hemoglobin A1C  Primary hypertension Blood pressure well controlled with current medications.  Continue current therapy.  Will reassess at follow up.   -  CBC w/Diff - CMP w/eGFR  Gastroesophageal reflux disease without esophagitis Patient stable.  Well controlled with current therapy.   Continue current meds.   Mixed hyperlipidemia Checking labs today.  Continue current therapy for lipid control. Will modify as needed based on labwork results.   -CMP w/eGFR -Lipid Panel  Vitamin D  deficiency, unspecified B12 deficiency due to diet Other fatigue Checking labs today.  Will continue supplements as needed.   - Vitamin D  - Vitamin B12 - TSH  Chronic kidney disease, stage 3b (HCC) Patient is seen by Nephrology, who manage this condition.  She is well controlled with current therapy.   Will defer to them for further changes to plan of care.     Return in about 3 months (around 10/13/2023) for F/U.   Total time spent: 20 minutes  ALAN CHRISTELLA ARRANT, FNP  07/13/2023   This document may have been prepared by [facility] Voice Recognition software and as such may include unintentional dictation errors.

## 2023-07-14 ENCOUNTER — Other Ambulatory Visit: Payer: Self-pay

## 2023-07-14 ENCOUNTER — Emergency Department
Admission: EM | Admit: 2023-07-14 | Discharge: 2023-07-14 | Disposition: A | Discharge status: Home / Self Care | Attending: Emergency Medicine | Admitting: Emergency Medicine

## 2023-07-14 ENCOUNTER — Encounter: Payer: Self-pay | Admitting: Intensive Care

## 2023-07-14 ENCOUNTER — Emergency Department

## 2023-07-14 DIAGNOSIS — K625 Hemorrhage of anus and rectum: Secondary | ICD-10-CM | POA: Diagnosis present

## 2023-07-14 DIAGNOSIS — E119 Type 2 diabetes mellitus without complications: Secondary | ICD-10-CM | POA: Insufficient documentation

## 2023-07-14 DIAGNOSIS — I10 Essential (primary) hypertension: Secondary | ICD-10-CM | POA: Diagnosis not present

## 2023-07-14 LAB — CMP14+EGFR
ALT: 24 IU/L (ref 0–32)
AST: 39 IU/L (ref 0–40)
Albumin: 4.3 g/dL (ref 3.9–4.9)
Alkaline Phosphatase: 310 IU/L — ABNORMAL HIGH (ref 44–121)
BUN/Creatinine Ratio: 17 (ref 12–28)
BUN: 38 mg/dL — ABNORMAL HIGH (ref 8–27)
Bilirubin Total: <0.2 mg/dL (ref 0.0–1.2)
CO2: 18 mmol/L — ABNORMAL LOW (ref 20–29)
Calcium: 9.1 mg/dL (ref 8.7–10.3)
Chloride: 100 mmol/L (ref 96–106)
Creatinine, Ser: 2.19 mg/dL — ABNORMAL HIGH (ref 0.57–1.00)
Globulin, Total: 2.9 g/dL (ref 1.5–4.5)
Glucose: 116 mg/dL — ABNORMAL HIGH (ref 70–99)
Potassium: 4.3 mmol/L (ref 3.5–5.2)
Sodium: 137 mmol/L (ref 134–144)
Total Protein: 7.2 g/dL (ref 6.0–8.5)
eGFR: 24 mL/min/1.73 — ABNORMAL LOW (ref >59)

## 2023-07-14 LAB — CBC
HCT: 27.2 % — ABNORMAL LOW (ref 36.0–46.0)
Hemoglobin: 9.1 g/dL — ABNORMAL LOW (ref 12.0–15.0)
MCH: 30.7 pg (ref 26.0–34.0)
MCHC: 33.5 g/dL (ref 30.0–36.0)
MCV: 91.9 fL (ref 80.0–100.0)
Platelets: 261 K/uL (ref 150–400)
RBC: 2.96 MIL/uL — ABNORMAL LOW (ref 3.87–5.11)
RDW: 13.1 % (ref 11.5–15.5)
WBC: 6.6 K/uL (ref 4.0–10.5)
nRBC: 0 % (ref 0.0–0.2)

## 2023-07-14 LAB — URINALYSIS, ROUTINE W REFLEX MICROSCOPIC
Bilirubin, UA: NEGATIVE
Ketones, UA: NEGATIVE
Leukocytes,UA: NEGATIVE
Nitrite, UA: NEGATIVE
Specific Gravity, UA: 1.013 (ref 1.005–1.030)
Urobilinogen, Ur: 0.2 mg/dL (ref 0.2–1.0)
pH, UA: 6 (ref 5.0–7.5)

## 2023-07-14 LAB — COMPREHENSIVE METABOLIC PANEL WITH GFR
ALT: 27 U/L (ref 0–44)
AST: 52 U/L — ABNORMAL HIGH (ref 15–41)
Albumin: 3.8 g/dL (ref 3.5–5.0)
Alkaline Phosphatase: 226 U/L — ABNORMAL HIGH (ref 38–126)
Anion gap: 11 (ref 5–15)
BUN: 37 mg/dL — ABNORMAL HIGH (ref 8–23)
CO2: 23 mmol/L (ref 22–32)
Calcium: 8.9 mg/dL (ref 8.9–10.3)
Chloride: 102 mmol/L (ref 98–111)
Creatinine, Ser: 2.17 mg/dL — ABNORMAL HIGH (ref 0.44–1.00)
GFR, Estimated: 25 mL/min — ABNORMAL LOW (ref >60)
Glucose, Bld: 103 mg/dL — ABNORMAL HIGH (ref 70–99)
Potassium: 4.4 mmol/L (ref 3.5–5.1)
Sodium: 136 mmol/L (ref 135–145)
Total Bilirubin: 0.7 mg/dL (ref 0.0–1.2)
Total Protein: 7.1 g/dL (ref 6.5–8.1)

## 2023-07-14 LAB — LIPID PANEL
Chol/HDL Ratio: 2.7 ratio (ref 0.0–4.4)
Cholesterol, Total: 173 mg/dL (ref 100–199)
HDL: 65 mg/dL (ref >39)
LDL Chol Calc (NIH): 72 mg/dL (ref 0–99)
Triglycerides: 222 mg/dL — ABNORMAL HIGH (ref 0–149)
VLDL Cholesterol Cal: 36 mg/dL (ref 5–40)

## 2023-07-14 LAB — PROTIME-INR
INR: 0.9 (ref 0.8–1.2)
Prothrombin Time: 13.2 s (ref 11.4–15.2)

## 2023-07-14 LAB — CBC WITH DIFFERENTIAL/PLATELET
Basophils Absolute: 0.1 x10E3/uL (ref 0.0–0.2)
Basos: 1 %
EOS (ABSOLUTE): 0.1 x10E3/uL (ref 0.0–0.4)
Eos: 1 %
Hematocrit: 28.2 % — ABNORMAL LOW (ref 34.0–46.6)
Hemoglobin: 9.1 g/dL — ABNORMAL LOW (ref 11.1–15.9)
Immature Grans (Abs): 0 x10E3/uL (ref 0.0–0.1)
Immature Granulocytes: 0 %
Lymphocytes Absolute: 2.5 x10E3/uL (ref 0.7–3.1)
Lymphs: 35 %
MCH: 31.3 pg (ref 26.6–33.0)
MCHC: 32.3 g/dL (ref 31.5–35.7)
MCV: 97 fL (ref 79–97)
Monocytes Absolute: 0.6 x10E3/uL (ref 0.1–0.9)
Monocytes: 9 %
Neutrophils Absolute: 3.8 x10E3/uL (ref 1.4–7.0)
Neutrophils: 54 %
Platelets: 269 x10E3/uL (ref 150–450)
RBC: 2.91 x10E6/uL — ABNORMAL LOW (ref 3.77–5.28)
RDW: 13.3 % (ref 11.7–15.4)
WBC: 7 x10E3/uL (ref 3.4–10.8)

## 2023-07-14 LAB — VITAMIN D 25 HYDROXY (VIT D DEFICIENCY, FRACTURES): Vit D, 25-Hydroxy: 29.8 ng/mL — ABNORMAL LOW (ref 30.0–100.0)

## 2023-07-14 LAB — HEMOGLOBIN A1C
Est. average glucose Bld gHb Est-mCnc: 117 mg/dL
Hgb A1c MFr Bld: 5.7 % — ABNORMAL HIGH (ref 4.8–5.6)

## 2023-07-14 LAB — TSH: TSH: 2.43 u[IU]/mL (ref 0.450–4.500)

## 2023-07-14 LAB — MICROSCOPIC EXAMINATION
Bacteria, UA: NONE SEEN
Casts: NONE SEEN /LPF
Epithelial Cells (non renal): NONE SEEN /HPF (ref 0–10)
RBC, Urine: NONE SEEN /HPF (ref 0–2)

## 2023-07-14 LAB — VITAMIN B12: Vitamin B-12: 529 pg/mL (ref 232–1245)

## 2023-07-14 LAB — LIPASE, BLOOD: Lipase: 64 U/L — ABNORMAL HIGH (ref 11–51)

## 2023-07-14 MED ORDER — MORPHINE SULFATE (PF) 4 MG/ML IV SOLN
4.0000 mg | Freq: Once | INTRAVENOUS | Status: AC
  Administered 2023-07-14: 4 mg via INTRAVENOUS
  Filled 2023-07-14: qty 1

## 2023-07-14 NOTE — ED Triage Notes (Signed)
 Patient presents with abdominal pain and rectal bleeding that started early this AM  Patient arrived with foley in place  Reports recent hospital stay and receiving blood transfusion

## 2023-07-14 NOTE — ED Provider Notes (Signed)
 [facility] Provider Note    Event Date/Time   First MD Initiated Contact with Patient 07/14/23 1300     (approximate)   History   Rectal Bleeding and Abdominal Pain   HPI  MIA MILAN is a 68 y.o. female past medical history significant for diabetes, hypertension, neurogenic bladder, chronic pancreatitis with chronic alcoholism, hypertension, history of hyponatremia, who presents to the emergency department with rectal bleeding.  States that she noted bright red blood with having a bowel movement today.  Denies any significant upper abdominal pain.  No melanotic stool.  Denies any history of cirrhosis.  No longer drinking alcohol.     Physical Exam   Triage Vital Signs: ED Triage Vitals  Encounter Vitals Group     BP 07/14/23 0921 (!) 148/84     Girls Systolic BP Percentile --      Girls Diastolic BP Percentile --      Boys Systolic BP Percentile --      Boys Diastolic BP Percentile --      Pulse Rate 07/14/23 0921 89     Resp 07/14/23 0921 16     Temp 07/14/23 0921 98.1 F (36.7 C)     Temp Source 07/14/23 0921 Oral     SpO2 07/14/23 0921 100 %     Weight 07/14/23 0923 115 lb (52.2 kg)     Height 07/14/23 0923 5' 1 (1.549 m)     Head Circumference --      Peak Flow --      Pain Score 07/14/23 0922 7     Pain Loc --      Pain Education --      Exclude from Growth Chart --     Most recent vital signs: Vitals:   07/14/23 1330 07/14/23 1357  BP: (!) 179/92 (!) 179/82  Pulse: 85 85  Resp:  16  Temp:  98.1 F (36.7 C)  SpO2: 100% 100%    Physical Exam Constitutional:      Appearance: She is well-developed.  HENT:     Head: Atraumatic.  Eyes:     Conjunctiva/sclera: Conjunctivae normal.  Cardiovascular:     Rate and Rhythm: Regular rhythm.  Pulmonary:     Effort: No respiratory distress.  Abdominal:     General: There is no distension.     Comments: Suprapubic catheter in place.  Mild lower abdominal tenderness to palpation  with no rebound or guarding.  DRE with dark red blood but no melena no obvious fissure, no obvious external hemorrhoid.  Musculoskeletal:        General: Normal range of motion.     Cervical back: Normal range of motion.  Skin:    General: Skin is warm.  Neurological:     Mental Status: She is alert. Mental status is at baseline.     IMPRESSION / MDM / ASSESSMENT AND PLAN / ED COURSE  I reviewed the triage vital signs and the nursing notes.  Differential diagnosis including significant upper GI bleed, lower GI bleed, malignancy  1 dose of IV morphine  for pain control  No tachycardic or bradycardic dysrhythmias while on cardiac telemetry.  RADIOLOGY I independently reviewed imaging, my interpretation of imaging: CT scan abdomen and pelvis without signs of bowel obstruction.  No acute findings.  2 mm nonobstructing right renal calculus with no hydronephrosis.  Diverticulosis but no signs of diverticulitis.  LABS (all labs ordered are listed, but only abnormal results are displayed) Labs interpreted  as -    Labs Reviewed  LIPASE, BLOOD - Abnormal; Notable for the following components:      Result Value   Lipase 64 (*)    All other components within normal limits  COMPREHENSIVE METABOLIC PANEL WITH GFR - Abnormal; Notable for the following components:   Glucose, Bld 103 (*)    BUN 37 (*)    Creatinine, Ser 2.17 (*)    AST 52 (*)    Alkaline Phosphatase 226 (*)    GFR, Estimated 25 (*)    All other components within normal limits  CBC - Abnormal; Notable for the following components:   RBC 2.96 (*)    Hemoglobin 9.1 (*)    HCT 27.2 (*)    All other components within normal limits  PROTIME-INR  TYPE AND SCREEN     MDM  On reevaluation patient states she is feeling much better.  No further episodes of bright red blood per rectum.  Hemoglobin is stable at 9.1.  Normal INR.  Lipase chronically elevated.  BUN is chronically elevated and at her baseline.  Creatinine at her  baseline.  No significant electrolyte abnormalities.  Alk phos is also at her baseline and normal T. bili.  Clinical picture is more consistent with lower GI bleed.  No melena on exam and no upper abdominal pain.  Patient states that she is feeling better and has a follow-up appointment with her gastroenterologist tomorrow.  Discussed return for any worsening symptoms.  No questions at time of discharge.     PROCEDURES:  Critical Care performed: No  Procedures  Patient's presentation is most consistent with acute presentation with potential threat to life or bodily function.   MEDICATIONS ORDERED IN ED: Medications  morphine  (PF) 4 MG/ML injection 4 mg (4 mg Intravenous Given 07/14/23 1443)    FINAL CLINICAL IMPRESSION(S) / ED DIAGNOSES   Final diagnoses:  Rectal bleeding     Rx / DC Orders   ED Discharge Orders     None        Note:  This document was prepared using Dragon voice recognition software and may include unintentional dictation errors.   Suzanne Kirsch, MD 07/14/23 1531

## 2023-07-14 NOTE — Discharge Instructions (Signed)
 You were seen in the emergency department for rectal bleeding.  Your hemoglobin level was stable and you were not losing enough blood to need a blood transfusion.  You had a CT scan that did not show any obvious reason for your bleed.  It is importantly follow-up closely with your gastroenterologist, you likely need a colonoscopy.  Return to the emergency department for any worsening symptoms.

## 2023-07-15 ENCOUNTER — Ambulatory Visit (INDEPENDENT_AMBULATORY_CARE_PROVIDER_SITE_OTHER): Admitting: Physician Assistant

## 2023-07-15 VITALS — BP 83/43 | HR 58

## 2023-07-15 DIAGNOSIS — Z435 Encounter for attention to cystostomy: Secondary | ICD-10-CM

## 2023-07-15 LAB — TYPE AND SCREEN
ABO/RH(D): B POS
Antibody Screen: POSITIVE
DAT, IgG: NEGATIVE
DAT, complement: NEGATIVE

## 2023-07-15 NOTE — Progress Notes (Signed)
 Suprapubic Cath Change  Patient is present today for a suprapubic catheter change due to urinary retention.  8ml of water was drained from the balloon, a 16FR Silastic foley cath was removed from the tract with out difficulty.  Site was cleaned and prepped in a sterile fashion with betadine.  A 16FR Silastic  foley cath was replaced into the tract no complications were noted. Urine return was noted, 10 ml of sterile water was inflated into the balloon and a leg bag was attached for drainage.  Patient tolerated well.    Performed by: Stein Windhorst, PA-C   Follow up: Return in about 4 weeks (around 08/12/2023) for SPT exchange.

## 2023-07-16 LAB — URINE CULTURE

## 2023-07-19 ENCOUNTER — Ambulatory Visit: Admitting: Physician Assistant

## 2023-07-21 ENCOUNTER — Ambulatory Visit: Admitting: Urology

## 2023-07-22 ENCOUNTER — Telehealth: Payer: Self-pay

## 2023-07-22 NOTE — Telephone Encounter (Signed)
 Pt called the triage line stating she needs a bigger cath bag. States she has to empty leg bag too often. LVM for pt to return call. Left a bag with a over night bag and an extra leg bag.

## 2023-07-30 ENCOUNTER — Other Ambulatory Visit: Payer: Self-pay | Admitting: Family

## 2023-08-03 ENCOUNTER — Other Ambulatory Visit: Payer: Self-pay | Admitting: Family

## 2023-08-03 ENCOUNTER — Telehealth: Payer: Self-pay | Admitting: Family

## 2023-08-03 MED ORDER — BD PEN NEEDLE NANO U/F 32G X 4 MM MISC: 1 refills | Status: AC

## 2023-08-03 NOTE — Telephone Encounter (Signed)
 Needs her last A1C sent to them Fax # [phone]  thanks

## 2023-08-05 ENCOUNTER — Other Ambulatory Visit: Payer: Self-pay | Admitting: Family

## 2023-08-15 ENCOUNTER — Encounter: Payer: Self-pay | Admitting: Family

## 2023-08-15 NOTE — Assessment & Plan Note (Signed)
 Patient is stable, has follow up appointment with urology already.  Will defer to them for further treatment decisions.

## 2023-08-15 NOTE — Progress Notes (Signed)
 Established Patient Office Visit  Subjective:  Patient ID: Karen [name], female    DOB: 09-22-1955  Age: 68 y.o. MRN: [id number]  Chief Complaint  Patient presents with   Hospitalization Follow-up    Patient is here today for hospital follow up.  She had been admitted as a result of an obstruction and infection of her suprapubic catheter.  She is doing well otherwise, does not have any new major concerns.  Has follow up with Urology.   Also has another appointment set for us  already.      No other concerns at this time.   Past Medical History:  Diagnosis Date   Abdominal pain    Acute cystitis 05/18/2020   Acute kidney injury superimposed on CKD (HCC)    Acute on chronic pancreatitis (HCC) 01/03/2017   Acute renal insufficiency 10/29/2015   Adhesive capsulitis of left shoulder    Alcoholic pancreatitis    Asthma    Depression    Diabetes mellitus without complication (HCC)    Diabetic cataract of right eye (HCC) 08/31/2019   Formatting of this note might be different from the original. Added automatically from request for surgery [id number]   DKA (diabetic ketoacidoses) 02/17/2015   DKA (diabetic ketoacidosis) (HCC) 02/17/2015   IMO SNOMED Dx Update Oct 2024     Elevated alkaline phosphatase level 10/30/2021   Emphysematous cystitis    Esophageal candidiasis (HCC)    ETOH abuse 01/03/2017   Headache 02/26/2019   Hypercholesteremia    Hypertension    Hypertensive urgency 04/21/2016   Hypokalemia    Left flank pain 10/29/2015   Left upper quadrant abdominal pain 05/18/2020   Leukocytosis 06/13/2019   Malignant essential hypertension 10/29/2015   Upper GI bleed 11/11/2017   UTI (urinary tract infection) 01/24/2018    Past Surgical History:  Procedure Laterality Date   ABDOMINAL HYSTERECTOMY  1996   APPENDECTOMY  1997   CYSTOSCOPY WITH FULGERATION N/A 05/27/2023   Procedure: CYSTOSCOPY, WITH BLADDER FULGURATION and clot evacuation;  Surgeon: Francisca Redell BROCKS,  MD;  Location: [facility] ORS;  Service: Urology;  Laterality: N/A;   ESOPHAGOGASTRODUODENOSCOPY N/A 11/12/2017   Procedure: ESOPHAGOGASTRODUODENOSCOPY (EGD);  Surgeon: Unk Corinn Skiff, MD;  Location: [facility] ENDOSCOPY;  Service: Gastroenterology;  Laterality: N/A;   ESOPHAGOGASTRODUODENOSCOPY N/A 09/29/2018   Procedure: ESOPHAGOGASTRODUODENOSCOPY (EGD);  Surgeon: Toledo, Ladell POUR, MD;  Location: [facility] ENDOSCOPY;  Service: Gastroenterology;  Laterality: N/A;   ESOPHAGOGASTRODUODENOSCOPY (EGD) WITH PROPOFOL  N/A 04/24/2016   Procedure: ESOPHAGOGASTRODUODENOSCOPY (EGD) WITH PROPOFOL ;  Surgeon: Rogelia Copping, MD;  Location: [facility] ENDOSCOPY;  Service: Endoscopy;  Laterality: N/A;   ESOPHAGOGASTRODUODENOSCOPY (EGD) WITH PROPOFOL  N/A 01/28/2019   Procedure: ESOPHAGOGASTRODUODENOSCOPY (EGD) WITH PROPOFOL ;  Surgeon: Toledo, Ladell POUR, MD;  Location: [facility] ENDOSCOPY;  Service: Gastroenterology;  Laterality: N/A;   EYE SURGERY     HAND SURGERY  1988   IR CYSTOSTOMY TUBE PLACEMENT/BLADDER ASPIRATION  05/20/2023   THYROID  SURGERY  2013    Social History   Socioeconomic History   Marital status: Significant Other    Spouse name: Not on file   Number of children: 2   Years of education: Not on file   Highest education level: Not on file  Occupational History   Occupation: disabled  Tobacco Use   Smoking status: Former    Types: Cigarettes   Smokeless tobacco: Never   Tobacco comments:    quit 7 years ago   Vaping Use   Vaping status: Never Used  Substance and Sexual Activity  Alcohol use: Not Currently    Alcohol/week: 1.0 standard drink of alcohol    Types: 1 Glasses of wine per week   Drug use: No   Sexual activity: Not Currently  Other Topics Concern   Not on file  Social History Narrative   Lives with Cordella Molt, TERRACE Chyle, great grandkids, & Great/great grandkids   Social Drivers of Health   Financial Resource Strain: Low Risk  (10/31/2021)   [facility]    Overall Financial Resource Strain (CARDIA)    Difficulty of Paying Living Expenses: Not hard at all  Food Insecurity: No Food Insecurity (05/21/2023)   Hunger Vital Sign    Worried About Running Out of Food in the Last Year: Never true    Ran Out of Food in the Last Year: Never true  Transportation Needs: No Transportation Needs (05/21/2023)   PRAPARE - Administrator, Civil Service (Medical): No    Lack of Transportation (Non-Medical): No  Physical Activity: Not on file  Stress: Not on file  Social Connections: Socially Isolated (05/21/2023)   Social Connection and Isolation Panel    Frequency of Communication with Friends and Family: Three times a week    Frequency of Social Gatherings with Friends and Family: Once a week    Attends Religious Services: Never    Database administrator or Organizations: No    Attends Banker Meetings: Never    Marital Status: Divorced  Catering manager Violence: Not At Risk (05/21/2023)   Humiliation, Afraid, Rape, and Kick questionnaire    Fear of Current or Ex-Partner: No    Emotionally Abused: No    Physically Abused: No    Sexually Abused: No    Family History  Problem Relation Age of Onset   Diabetes Mother     Allergies  Allergen Reactions   Metoclopramide  Other (See Comments) and Shortness Of Breath    Hypotension, shortness of breath  Hypotension, shortness of breath  Hypotension, shortness of breath  Hypotension, shortness of breath    Other Reaction(s): Other (See Comments)    Hypotension, shortness of breath  Hypotension, shortness of breath Hypotension, shortness of breath  Shortness of Breath, Hypotension   Penicillins Anaphylaxis, Rash and Swelling    Has patient had a PCN reaction causing immediate rash, facial/tongue/throat swelling, SOB or lightheadedness with hypotension: Yes  Has patient had a PCN reaction causing severe rash involving mucus membranes or skin necrosis: No  Has patient had a  PCN reaction that required hospitalization No  Has patient had a PCN reaction occurring within the last 10 years: No  If all of the above answers are NO, then may proceed with Cephalosporin use.  Has patient had a PCN reaction causing immediate rash, facial/tongue/throat swelling, SOB or lightheadedness with hypotension: Yes  Has patient had a PCN reaction causing severe rash involving mucus membranes or skin necrosis: No  Has patient had a PCN reaction that required hospitalization No  Has patient had a PCN reaction occurring within the last 10 years: No  If all of the above answers are NO, then may proceed with Cephalosporin use.  Has patient had a PCN reaction causing immediate rash, facial/tongue/throat swelling, SOB or lightheadedness with hypotension: Yes  Has patient had a PCN reaction causing severe rash involving mucus membranes or skin necrosis: No  Has patient had a PCN reaction that required hospitalization No  Has patient had a PCN reaction occurring within the  last 10 years: No  If all of the above answers are NO, then may proceed with Cephalosporin use.  Has patient had a PCN reaction causing immediate rash, facial/tongue/throat swelling, SOB or lightheadedness with hypotension: Yes Has patient had a PCN reaction causing severe rash involving mucus membranes or skin necrosis: No Has patient had a PCN reaction that required hospitalization No Has patient had a PCN reaction occurring within the last 10 years: No If all of the above answers are NO, then may proceed with Cephalosporin use.   Fentanyl  Rash    Rash   Other Rash    Bee sting   Bee sting  Bee sting    Bee sting   Bee sting   Oxycodone -Acetaminophen  Rash    plain tylenol  can also make itch?  plain tylenol  can also make itch?  plain tylenol  can also make itch?  plain tylenol  can also make itch?    plain tylenol  can also make itch?  plain tylenol  can also make itch? plain tylenol  can also make itch?    Percocet [Oxycodone -Acetaminophen ] Rash    plain tylenol  can also make itch?    Review of Systems  All other systems reviewed and are negative.      Objective:   BP 130/60   Pulse 61   Ht 5' 1 (1.549 m)   Wt 100 lb 3.2 oz (45.5 kg)   SpO2 99%   BMI 18.93 kg/m   Vitals:   06/01/23 1349  BP: 130/60  Pulse: 61  Height: 5' 1 (1.549 m)  Weight: 100 lb 3.2 oz (45.5 kg)  SpO2: 99%  BMI (Calculated): 18.94    Physical Exam Vitals and nursing note reviewed.  Constitutional:      Appearance: Normal appearance. She is normal weight.  HENT:     Head: Normocephalic.  Eyes:     Extraocular Movements: Extraocular movements intact.     Conjunctiva/sclera: Conjunctivae normal.     Pupils: Pupils are equal, round, and reactive to light.  Cardiovascular:     Rate and Rhythm: Normal rate.  Pulmonary:     Effort: Pulmonary effort is normal.  Neurological:     General: No focal deficit present.     Mental Status: She is alert and oriented to person, place, and time. Mental status is at baseline.  Psychiatric:        Mood and Affect: Mood normal.        Behavior: Behavior normal.        Thought Content: Thought content normal.        Judgment: Judgment normal.      No results found for any visits on 06/01/23.  Recent Results (from the past 2160 hours)  CBC with Differential/Platelet     Status: None   Collection Time: 05/20/23 10:13 AM  Result Value Ref Range   WBC 7.8 4.0 - 10.5 K/uL   RBC 4.13 3.87 - 5.11 MIL/uL   Hemoglobin 12.5 12.0 - 15.0 g/dL   HCT 63.6 63.9 - 53.9 %   MCV 87.9 80.0 - 100.0 fL   MCH 30.3 26.0 - 34.0 pg   MCHC 34.4 30.0 - 36.0 g/dL   RDW 88.0 88.4 - 84.4 %   Platelets 287 150 - 400 K/uL   nRBC 0.0 0.0 - 0.2 %   Neutrophils Relative % 57 %   Neutro Abs 4.5 1.7 - 7.7 K/uL   Lymphocytes Relative 33 %   Lymphs Abs 2.6 0.7 - 4.0 K/uL  Monocytes Relative 7 %   Monocytes Absolute 0.6 0.1 - 1.0 K/uL   Eosinophils Relative 1 %   Eosinophils  Absolute 0.1 0.0 - 0.5 K/uL   Basophils Relative 1 %   Basophils Absolute 0.1 0.0 - 0.1 K/uL   Immature Granulocytes 1 %   Abs Immature Granulocytes 0.06 0.00 - 0.07 K/uL    Comment: Performed at [facility], [address] [place], [place] [address]  Protime-INR     Status: None   Collection Time: 05/20/23 10:13 AM  Result Value Ref Range   Prothrombin Time 13.0 11.4 - 15.2 seconds   INR 1.0 0.8 - 1.2    Comment: (NOTE) INR goal varies based on device and disease states. Performed at [facility], [address] [place], [place] [address]   Glucose, capillary     Status: Abnormal   Collection Time: 05/20/23 10:20 AM  Result Value Ref Range   Glucose-Capillary 203 (H) 70 - 99 mg/dL    Comment: Glucose reference range applies only to samples taken after fasting for at least 8 hours.  CBC with Differential     Status: Abnormal   Collection Time: 05/21/23  8:15 AM  Result Value Ref Range   WBC 17.3 (H) 4.0 - 10.5 K/uL   RBC 3.52 (L) 3.87 - 5.11 MIL/uL   Hemoglobin 10.7 (L) 12.0 - 15.0 g/dL   HCT 68.5 (L) 63.9 - 53.9 %   MCV 89.2 80.0 - 100.0 fL   MCH 30.4 26.0 - 34.0 pg   MCHC 34.1 30.0 - 36.0 g/dL   RDW 87.7 88.4 - 84.4 %   Platelets 257 150 - 400 K/uL   nRBC 0.0 0.0 - 0.2 %   Neutrophils Relative % 82 %   Neutro Abs 14.1 (H) 1.7 - 7.7 K/uL   Lymphocytes Relative 10 %   Lymphs Abs 1.8 0.7 - 4.0 K/uL   Monocytes Relative 7 %   Monocytes Absolute 1.2 (H) 0.1 - 1.0 K/uL   Eosinophils Relative 0 %   Eosinophils Absolute 0.0 0.0 - 0.5 K/uL   Basophils Relative 0 %   Basophils Absolute 0.1 0.0 - 0.1 K/uL   Immature Granulocytes 1 %   Abs Immature Granulocytes 0.13 (H) 0.00 - 0.07 K/uL    Comment: Performed at [facility], [address] [place], [place] [address]  Basic metabolic panel     Status: Abnormal   Collection Time: 05/21/23  8:15 AM  Result Value Ref Range   Sodium 134 (L) 135 - 145 mmol/L   Potassium 4.5 3.5 - 5.1 mmol/L    Chloride 102 98 - 111 mmol/L   CO2 21 (L) 22 - 32 mmol/L   Glucose, Bld 228 (H) 70 - 99 mg/dL    Comment: Glucose reference range applies only to samples taken after fasting for at least 8 hours.   BUN 33 (H) 8 - 23 mg/dL   Creatinine, Ser 7.45 (H) 0.44 - 1.00 mg/dL   Calcium  8.7 (L) 8.9 - 10.3 mg/dL   GFR, Estimated 20 (L) >60 mL/min    Comment: (NOTE) Calculated using the CKD-EPI Creatinine Equation (2021)    Anion gap 11 5 - 15    Comment: Performed at [facility], [address] [place], [place] [address]  Glucose, capillary     Status: Abnormal   Collection Time: 05/21/23  5:54 PM  Result Value Ref Range   Glucose-Capillary 220 (H) 70 - 99 mg/dL  Comment: Glucose reference range applies only to samples taken after fasting for at least 8 hours.  Urinalysis, Complete w Microscopic -Urine, Clean Catch     Status: Abnormal   Collection Time: 05/21/23  6:00 PM  Result Value Ref Range   Color, Urine STRAW (A) YELLOW   APPearance HAZY (A) CLEAR   Specific Gravity, Urine 1.004 (L) 1.005 - 1.030   pH 5.0 5.0 - 8.0   Glucose, UA NEGATIVE NEGATIVE mg/dL   Hgb urine dipstick LARGE (A) NEGATIVE   Bilirubin Urine NEGATIVE NEGATIVE   Ketones, ur NEGATIVE NEGATIVE mg/dL   Protein, ur 30 (A) NEGATIVE mg/dL   Nitrite NEGATIVE NEGATIVE   Leukocytes,Ua NEGATIVE NEGATIVE   RBC / HPF >50 0 - 5 RBC/hpf   WBC, UA 11-20 0 - 5 WBC/hpf   Bacteria, UA RARE (A) NONE SEEN   Squamous Epithelial / HPF 0-5 0 - 5 /HPF    Comment: Performed at [facility], [address] [place], [place] [address]  Glucose, capillary     Status: Abnormal   Collection Time: 05/21/23  9:04 PM  Result Value Ref Range   Glucose-Capillary 190 (H) 70 - 99 mg/dL    Comment: Glucose reference range applies only to samples taken after fasting for at least 8 hours.  Basic metabolic panel     Status: Abnormal   Collection Time: 05/22/23  5:25 AM  Result Value Ref Range   Sodium 134 (L) 135 - 145 mmol/L    Potassium 4.4 3.5 - 5.1 mmol/L   Chloride 102 98 - 111 mmol/L   CO2 20 (L) 22 - 32 mmol/L   Glucose, Bld 184 (H) 70 - 99 mg/dL    Comment: Glucose reference range applies only to samples taken after fasting for at least 8 hours.   BUN 37 (H) 8 - 23 mg/dL   Creatinine, Ser 7.47 (H) 0.44 - 1.00 mg/dL   Calcium  8.3 (L) 8.9 - 10.3 mg/dL   GFR, Estimated 20 (L) >60 mL/min    Comment: (NOTE) Calculated using the CKD-EPI Creatinine Equation (2021)    Anion gap 12 5 - 15    Comment: Performed at [facility], [address] [place], [place] [address]  CBC     Status: Abnormal   Collection Time: 05/22/23  5:25 AM  Result Value Ref Range   WBC 8.3 4.0 - 10.5 K/uL   RBC 2.86 (L) 3.87 - 5.11 MIL/uL   Hemoglobin 8.8 (L) 12.0 - 15.0 g/dL   HCT 73.3 (L) 63.9 - 53.9 %   MCV 93.0 80.0 - 100.0 fL   MCH 30.8 26.0 - 34.0 pg   MCHC 33.1 30.0 - 36.0 g/dL   RDW 87.7 88.4 - 84.4 %   Platelets 192 150 - 400 K/uL   nRBC 0.0 0.0 - 0.2 %    Comment: Performed at [facility], [address] [place], [place] [address]  Glucose, capillary     Status: Abnormal   Collection Time: 05/22/23  7:35 AM  Result Value Ref Range   Glucose-Capillary 155 (H) 70 - 99 mg/dL    Comment: Glucose reference range applies only to samples taken after fasting for at least 8 hours.  Glucose, capillary     Status: Abnormal   Collection Time: 05/22/23 11:32 AM  Result Value Ref Range   Glucose-Capillary 125 (H) 70 - 99 mg/dL    Comment: Glucose reference range applies only to samples taken after fasting for at least  8 hours.  Hemoglobin and hematocrit, blood     Status: Abnormal   Collection Time: 05/22/23  3:25 PM  Result Value Ref Range   Hemoglobin 8.9 (L) 12.0 - 15.0 g/dL   HCT 73.5 (L) 63.9 - 53.9 %    Comment: Performed at [facility], [address] [place], [place] [address]  Glucose, capillary     Status: Abnormal   Collection Time: 05/22/23  4:13 PM  Result Value Ref Range    Glucose-Capillary 134 (H) 70 - 99 mg/dL    Comment: Glucose reference range applies only to samples taken after fasting for at least 8 hours.  Glucose, capillary     Status: Abnormal   Collection Time: 05/22/23  9:14 PM  Result Value Ref Range   Glucose-Capillary 134 (H) 70 - 99 mg/dL    Comment: Glucose reference range applies only to samples taken after fasting for at least 8 hours.  CBC     Status: Abnormal   Collection Time: 05/23/23  5:17 AM  Result Value Ref Range   WBC 10.6 (H) 4.0 - 10.5 K/uL   RBC 2.46 (L) 3.87 - 5.11 MIL/uL   Hemoglobin 7.5 (L) 12.0 - 15.0 g/dL   HCT 77.3 (L) 63.9 - 53.9 %   MCV 91.9 80.0 - 100.0 fL   MCH 30.5 26.0 - 34.0 pg   MCHC 33.2 30.0 - 36.0 g/dL   RDW 88.0 88.4 - 84.4 %   Platelets 176 150 - 400 K/uL   nRBC 0.0 0.0 - 0.2 %    Comment: Performed at [facility], [address] [place], [place] [address]  Basic metabolic panel with GFR     Status: Abnormal   Collection Time: 05/23/23  5:17 AM  Result Value Ref Range   Sodium 133 (L) 135 - 145 mmol/L   Potassium 3.9 3.5 - 5.1 mmol/L   Chloride 103 98 - 111 mmol/L   CO2 15 (L) 22 - 32 mmol/L   Glucose, Bld 160 (H) 70 - 99 mg/dL    Comment: Glucose reference range applies only to samples taken after fasting for at least 8 hours.   BUN 28 (H) 8 - 23 mg/dL   Creatinine, Ser 7.80 (H) 0.44 - 1.00 mg/dL   Calcium  8.1 (L) 8.9 - 10.3 mg/dL   GFR, Estimated 24 (L) >60 mL/min    Comment: (NOTE) Calculated using the CKD-EPI Creatinine Equation (2021)    Anion gap 15 5 - 15    Comment: Performed at [facility], [address] [place], [place] [address]  Glucose, capillary     Status: Abnormal   Collection Time: 05/23/23  8:15 AM  Result Value Ref Range   Glucose-Capillary 144 (H) 70 - 99 mg/dL    Comment: Glucose reference range applies only to samples taken after fasting for at least 8 hours.  Prepare RBC (crossmatch)     Status: None   Collection Time: 05/23/23  8:51 AM  Result  Value Ref Range   Order Confirmation      ORDER PROCESSED BY BLOOD BANK Performed at [facility], [address] [place], [place] [address]   Type and screen [facility]     Status: None   Collection Time: 05/23/23  9:14 AM  Result Value Ref Range   ABO/RH(D) B POS    Antibody Screen NEG    Sample Expiration 05/26/2023,2359    Unit Number [id number]    Blood Component Type  RBC LR PHER1    Unit division 00    Status of Unit ISSUED,FINAL    Transfusion Status OK TO TRANSFUSE    Crossmatch Result      Compatible Performed at [facility], [address] [place], [place] [address]   BPAM RBC     Status: None   Collection Time: 05/23/23  9:14 AM  Result Value Ref Range   ISSUE DATE / TIME [id number]    Blood Product Unit Number [id number]    PRODUCT CODE [id number]    Unit Type and Rh 7300    Blood Product Expiration Date [id number]   Glucose, capillary     Status: Abnormal   Collection Time: 05/23/23 11:49 AM  Result Value Ref Range   Glucose-Capillary 281 (H) 70 - 99 mg/dL    Comment: Glucose reference range applies only to samples taken after fasting for at least 8 hours.   Comment 1 Notify RN    Comment 2 Document in Chart   Hemoglobin and hematocrit, blood     Status: Abnormal   Collection Time: 05/23/23  3:35 PM  Result Value Ref Range   Hemoglobin 10.1 (L) 12.0 - 15.0 g/dL    Comment: REPEATED TO VERIFY   HCT 29.8 (L) 36.0 - 46.0 %    Comment: Performed at [facility], [address] [place], [place] [address]  Glucose, capillary     Status: Abnormal   Collection Time: 05/23/23  4:29 PM  Result Value Ref Range   Glucose-Capillary 221 (H) 70 - 99 mg/dL    Comment: Glucose reference range applies only to samples taken after fasting for at least 8 hours.   Comment 1 Notify RN    Comment 2 Document in Chart   Glucose, capillary     Status: Abnormal   Collection Time: 05/23/23  9:17 PM  Result Value Ref  Range   Glucose-Capillary 228 (H) 70 - 99 mg/dL    Comment: Glucose reference range applies only to samples taken after fasting for at least 8 hours.  Basic metabolic panel with GFR     Status: Abnormal   Collection Time: 05/24/23  5:31 AM  Result Value Ref Range   Sodium 133 (L) 135 - 145 mmol/L   Potassium 3.8 3.5 - 5.1 mmol/L   Chloride 103 98 - 111 mmol/L   CO2 19 (L) 22 - 32 mmol/L   Glucose, Bld 215 (H) 70 - 99 mg/dL    Comment: Glucose reference range applies only to samples taken after fasting for at least 8 hours.   BUN 24 (H) 8 - 23 mg/dL   Creatinine, Ser 8.00 (H) 0.44 - 1.00 mg/dL   Calcium  8.5 (L) 8.9 - 10.3 mg/dL   GFR, Estimated 27 (L) >60 mL/min    Comment: (NOTE) Calculated using the CKD-EPI Creatinine Equation (2021)    Anion gap 11 5 - 15    Comment: Performed at [facility], [address] [place], [place] [address]  CBC     Status: Abnormal   Collection Time: 05/24/23  5:31 AM  Result Value Ref Range   WBC 8.4 4.0 - 10.5 K/uL   RBC 3.37 (L) 3.87 - 5.11 MIL/uL   Hemoglobin 10.1 (L) 12.0 - 15.0 g/dL   HCT 70.5 (L) 63.9 - 53.9 %   MCV 87.2 80.0 - 100.0 fL   MCH 30.0 26.0 - 34.0 pg   MCHC 34.4 30.0 - 36.0 g/dL   RDW 87.7 88.4 -  15.5 %   Platelets 181 150 - 400 K/uL   nRBC 0.0 0.0 - 0.2 %    Comment: Performed at [facility], [address] [place], [place] [address]  Glucose, capillary     Status: Abnormal   Collection Time: 05/24/23  9:16 AM  Result Value Ref Range   Glucose-Capillary 205 (H) 70 - 99 mg/dL    Comment: Glucose reference range applies only to samples taken after fasting for at least 8 hours.  Glucose, capillary     Status: Abnormal   Collection Time: 05/24/23 11:25 AM  Result Value Ref Range   Glucose-Capillary 309 (H) 70 - 99 mg/dL    Comment: Glucose reference range applies only to samples taken after fasting for at least 8 hours.  Glucose, capillary     Status: Abnormal   Collection Time: 05/24/23  5:47 PM   Result Value Ref Range   Glucose-Capillary 209 (H) 70 - 99 mg/dL    Comment: Glucose reference range applies only to samples taken after fasting for at least 8 hours.  Glucose, capillary     Status: Abnormal   Collection Time: 05/24/23  9:21 PM  Result Value Ref Range   Glucose-Capillary 238 (H) 70 - 99 mg/dL    Comment: Glucose reference range applies only to samples taken after fasting for at least 8 hours.  CBC     Status: Abnormal   Collection Time: 05/25/23  4:18 AM  Result Value Ref Range   WBC 7.5 4.0 - 10.5 K/uL   RBC 3.50 (L) 3.87 - 5.11 MIL/uL   Hemoglobin 10.5 (L) 12.0 - 15.0 g/dL   HCT 69.8 (L) 63.9 - 53.9 %   MCV 86.0 80.0 - 100.0 fL   MCH 30.0 26.0 - 34.0 pg   MCHC 34.9 30.0 - 36.0 g/dL   RDW 87.5 88.4 - 84.4 %   Platelets 221 150 - 400 K/uL   nRBC 0.0 0.0 - 0.2 %    Comment: Performed at [facility], [address], [place], [place] [address]  Basic metabolic panel with GFR     Status: Abnormal   Collection Time: 05/25/23  4:18 AM  Result Value Ref Range   Sodium 137 135 - 145 mmol/L   Potassium 3.7 3.5 - 5.1 mmol/L   Chloride 103 98 - 111 mmol/L   CO2 25 22 - 32 mmol/L   Glucose, Bld 192 (H) 70 - 99 mg/dL    Comment: Glucose reference range applies only to samples taken after fasting for at least 8 hours.   BUN 26 (H) 8 - 23 mg/dL   Creatinine, Ser 8.12 (H) 0.44 - 1.00 mg/dL   Calcium  9.0 8.9 - 10.3 mg/dL   GFR, Estimated 29 (L) >60 mL/min    Comment: (NOTE) Calculated using the CKD-EPI Creatinine Equation (2021)    Anion gap 9 5 - 15    Comment: Performed at [facility], [address] [place], [place] [address]  Glucose, capillary     Status: Abnormal   Collection Time: 05/25/23 10:04 AM  Result Value Ref Range   Glucose-Capillary 245 (H) 70 - 99 mg/dL    Comment: Glucose reference range applies only to samples taken after fasting for at least 8 hours.  Glucose, capillary     Status: Abnormal   Collection Time: 05/25/23 12:41 PM   Result Value Ref Range   Glucose-Capillary 195 (H) 70 - 99 mg/dL    Comment: Glucose reference range applies  only to samples taken after fasting for at least 8 hours.  Glucose, capillary     Status: Abnormal   Collection Time: 05/25/23  4:37 PM  Result Value Ref Range   Glucose-Capillary 100 (H) 70 - 99 mg/dL    Comment: Glucose reference range applies only to samples taken after fasting for at least 8 hours.   Comment 1 Notify RN   Glucose, capillary     Status: Abnormal   Collection Time: 05/25/23  8:43 PM  Result Value Ref Range   Glucose-Capillary 399 (H) 70 - 99 mg/dL    Comment: Glucose reference range applies only to samples taken after fasting for at least 8 hours.  Basic metabolic panel with GFR     Status: Abnormal   Collection Time: 05/26/23  5:20 AM  Result Value Ref Range   Sodium 133 (L) 135 - 145 mmol/L   Potassium 3.6 3.5 - 5.1 mmol/L   Chloride 102 98 - 111 mmol/L   CO2 24 22 - 32 mmol/L   Glucose, Bld 141 (H) 70 - 99 mg/dL    Comment: Glucose reference range applies only to samples taken after fasting for at least 8 hours.   BUN 29 (H) 8 - 23 mg/dL   Creatinine, Ser 7.93 (H) 0.44 - 1.00 mg/dL   Calcium  8.4 (L) 8.9 - 10.3 mg/dL   GFR, Estimated 26 (L) >60 mL/min    Comment: (NOTE) Calculated using the CKD-EPI Creatinine Equation (2021)    Anion gap 7 5 - 15    Comment: Performed at [facility], [address] [place], [place] [address]  CBC     Status: Abnormal   Collection Time: 05/26/23  5:20 AM  Result Value Ref Range   WBC 9.4 4.0 - 10.5 K/uL   RBC 3.12 (L) 3.87 - 5.11 MIL/uL   Hemoglobin 9.2 (L) 12.0 - 15.0 g/dL   HCT 73.4 (L) 63.9 - 53.9 %   MCV 84.9 80.0 - 100.0 fL   MCH 29.5 26.0 - 34.0 pg   MCHC 34.7 30.0 - 36.0 g/dL   RDW 87.8 88.4 - 84.4 %   Platelets 223 150 - 400 K/uL   nRBC 0.0 0.0 - 0.2 %    Comment: Performed at [facility], [address] [place], [place] [address]  Glucose, capillary     Status: Abnormal    Collection Time: 05/26/23  7:50 AM  Result Value Ref Range   Glucose-Capillary 142 (H) 70 - 99 mg/dL    Comment: Glucose reference range applies only to samples taken after fasting for at least 8 hours.   Comment 1 Notify RN    Comment 2 Document in Chart   Glucose, capillary     Status: Abnormal   Collection Time: 05/26/23 12:06 PM  Result Value Ref Range   Glucose-Capillary 184 (H) 70 - 99 mg/dL    Comment: Glucose reference range applies only to samples taken after fasting for at least 8 hours.   Comment 1 Notify RN    Comment 2 Document in Chart   Glucose, capillary     Status: Abnormal   Collection Time: 05/26/23  4:24 PM  Result Value Ref Range   Glucose-Capillary 107 (H) 70 - 99 mg/dL    Comment: Glucose reference range applies only to samples taken after fasting for at least 8 hours.   Comment 1 Notify RN    Comment 2 Document in Chart   Glucose, capillary  Status: Abnormal   Collection Time: 05/26/23 10:19 PM  Result Value Ref Range   Glucose-Capillary 233 (H) 70 - 99 mg/dL    Comment: Glucose reference range applies only to samples taken after fasting for at least 8 hours.  Basic metabolic panel with GFR     Status: Abnormal   Collection Time: 05/27/23  5:38 AM  Result Value Ref Range   Sodium 139 135 - 145 mmol/L   Potassium 3.2 (L) 3.5 - 5.1 mmol/L   Chloride 106 98 - 111 mmol/L   CO2 25 22 - 32 mmol/L   Glucose, Bld 135 (H) 70 - 99 mg/dL    Comment: Glucose reference range applies only to samples taken after fasting for at least 8 hours.   BUN 29 (H) 8 - 23 mg/dL   Creatinine, Ser 8.05 (H) 0.44 - 1.00 mg/dL   Calcium  8.6 (L) 8.9 - 10.3 mg/dL   GFR, Estimated 28 (L) >60 mL/min    Comment: (NOTE) Calculated using the CKD-EPI Creatinine Equation (2021)    Anion gap 8 5 - 15    Comment: Performed at [facility], [address] [place], [place] [address]  CBC     Status: Abnormal   Collection Time: 05/27/23  5:38 AM  Result Value Ref Range   WBC  9.8 4.0 - 10.5 K/uL   RBC 3.32 (L) 3.87 - 5.11 MIL/uL   Hemoglobin 9.8 (L) 12.0 - 15.0 g/dL   HCT 70.8 (L) 63.9 - 53.9 %   MCV 87.7 80.0 - 100.0 fL   MCH 29.5 26.0 - 34.0 pg   MCHC 33.7 30.0 - 36.0 g/dL   RDW 87.3 88.4 - 84.4 %   Platelets 306 150 - 400 K/uL   nRBC 0.0 0.0 - 0.2 %    Comment: Performed at [facility], [address], [place], [place] [address]  Magnesium     Status: None   Collection Time: 05/27/23  5:38 AM  Result Value Ref Range   Magnesium 2.2 1.7 - 2.4 mg/dL    Comment: Performed at [facility], [address] [place], [place] [address]  Glucose, capillary     Status: Abnormal   Collection Time: 05/27/23  7:51 AM  Result Value Ref Range   Glucose-Capillary 141 (H) 70 - 99 mg/dL    Comment: Glucose reference range applies only to samples taken after fasting for at least 8 hours.   Comment 1 Notify RN    Comment 2 Document in Chart   Glucose, capillary     Status: None   Collection Time: 05/27/23 10:25 AM  Result Value Ref Range   Glucose-Capillary 78 70 - 99 mg/dL    Comment: Glucose reference range applies only to samples taken after fasting for at least 8 hours.  Glucose, capillary     Status: None   Collection Time: 05/27/23 11:04 AM  Result Value Ref Range   Glucose-Capillary 74 70 - 99 mg/dL    Comment: Glucose reference range applies only to samples taken after fasting for at least 8 hours.  Glucose, capillary     Status: Abnormal   Collection Time: 05/27/23 11:34 AM  Result Value Ref Range   Glucose-Capillary 212 (H) 70 - 99 mg/dL    Comment: Glucose reference range applies only to samples taken after fasting for at least 8 hours.  Glucose, capillary     Status: Abnormal   Collection Time: 05/27/23 12:42 PM  Result Value Ref Range  Glucose-Capillary 203 (H) 70 - 99 mg/dL    Comment: Glucose reference range applies only to samples taken after fasting for at least 8 hours.  Glucose, capillary     Status: Abnormal    Collection Time: 05/27/23  1:29 PM  Result Value Ref Range   Glucose-Capillary 210 (H) 70 - 99 mg/dL    Comment: Glucose reference range applies only to samples taken after fasting for at least 8 hours.  Lipase, blood     Status: Abnormal   Collection Time: 06/08/23  2:47 PM  Result Value Ref Range   Lipase 62 (H) 11 - 51 U/L    Comment: Performed at [facility], [address] [place], [place] [address]  Comprehensive metabolic panel     Status: Abnormal   Collection Time: 06/08/23  2:47 PM  Result Value Ref Range   Sodium 134 (L) 135 - 145 mmol/L   Potassium 3.5 3.5 - 5.1 mmol/L   Chloride 102 98 - 111 mmol/L   CO2 20 (L) 22 - 32 mmol/L   Glucose, Bld 143 (H) 70 - 99 mg/dL    Comment: Glucose reference range applies only to samples taken after fasting for at least 8 hours.   BUN 36 (H) 8 - 23 mg/dL   Creatinine, Ser 7.95 (H) 0.44 - 1.00 mg/dL   Calcium  8.3 (L) 8.9 - 10.3 mg/dL   Total Protein 7.1 6.5 - 8.1 g/dL   Albumin 3.3 (L) 3.5 - 5.0 g/dL   AST 47 (H) 15 - 41 U/L   ALT 34 0 - 44 U/L   Alkaline Phosphatase 347 (H) 38 - 126 U/L   Total Bilirubin 0.6 0.0 - 1.2 mg/dL   GFR, Estimated 26 (L) >60 mL/min    Comment: (NOTE) Calculated using the CKD-EPI Creatinine Equation (2021)    Anion gap 12 5 - 15    Comment: Performed at [facility], [address] [place], [place] [address]  CBC     Status: Abnormal   Collection Time: 06/08/23  2:47 PM  Result Value Ref Range   WBC 6.0 4.0 - 10.5 K/uL   RBC 3.03 (L) 3.87 - 5.11 MIL/uL   Hemoglobin 9.1 (L) 12.0 - 15.0 g/dL   HCT 73.6 (L) 63.9 - 53.9 %   MCV 86.8 80.0 - 100.0 fL   MCH 30.0 26.0 - 34.0 pg   MCHC 34.6 30.0 - 36.0 g/dL   RDW 87.5 88.4 - 84.4 %   Platelets 251 150 - 400 K/uL   nRBC 0.0 0.0 - 0.2 %    Comment: Performed at [facility] [address] [place], [place] [address]  Urinalysis, Routine w reflex microscopic -Urine, Clean Catch     Status: Abnormal   Collection Time: 06/08/23   6:08 PM  Result Value Ref Range   Color, Urine YELLOW (A) YELLOW   APPearance CLOUDY (A) CLEAR   Specific Gravity, Urine 1.002 (L) 1.005 - 1.030   pH 6.0 5.0 - 8.0   Glucose, UA >=500 (A) NEGATIVE mg/dL   Hgb urine dipstick SMALL (A) NEGATIVE   Bilirubin Urine NEGATIVE NEGATIVE   Ketones, ur NEGATIVE NEGATIVE mg/dL   Protein, ur 899 (A) NEGATIVE mg/dL   Nitrite NEGATIVE NEGATIVE   Leukocytes,Ua LARGE (A) NEGATIVE   RBC / HPF 21-50 0 - 5 RBC/hpf   WBC, UA >50 0 - 5 WBC/hpf   Bacteria, UA MANY (A) NONE SEEN   Squamous Epithelial / HPF 0 0 -  5 /HPF   WBC Clumps PRESENT    Budding Yeast PRESENT     Comment: Performed at [facility], [address] [place], [place] [address]  Urine Culture     Status: Abnormal   Collection Time: 06/08/23  6:08 PM   Specimen: Urine, Suprapubic  Result Value Ref Range   Specimen Description      URINE, SUPRAPUBIC Performed at [facility], [address], [place], [place] [address]    Special Requests      NONE Performed at [facility], [address] [place], [place] [address]    Culture (A)     >=100,000 COLONIES/mL ESCHERICHIA COLI Confirmed Extended Spectrum Beta-Lactamase Producer (ESBL).  In bloodstream infections from ESBL organisms, carbapenems are preferred over piperacillin/tazobactam. They are shown to have a lower risk of mortality.    Report Status 06/10/2023 FINAL    Organism ID, Bacteria ESCHERICHIA COLI (A)       Susceptibility   Escherichia coli - MIC*    AMPICILLIN >=32 RESISTANT Resistant     CEFAZOLIN >=64 RESISTANT Resistant     CEFTRIAXONE  >=64 RESISTANT Resistant     CIPROFLOXACIN  >=4 RESISTANT Resistant     GENTAMICIN <=1 SENSITIVE Sensitive     IMIPENEM <=0.25 SENSITIVE Sensitive     NITROFURANTOIN  32 SENSITIVE Sensitive     TRIMETH /SULFA  <=20 SENSITIVE Sensitive     AMPICILLIN/SULBACTAM >=32 RESISTANT Resistant     * >=100,000 COLONIES/mL ESCHERICHIA COLI  Urinalysis, Complete      Status: Abnormal   Collection Time: 06/17/23 12:59 PM  Result Value Ref Range   Specific Gravity, UA 1.020 1.005 - 1.030   pH, UA 6.0 5.0 - 7.5   Color, UA Yellow Yellow   Appearance Ur Slightly cloudy Clear   Leukocytes,UA 1+ (A) Negative   Protein,UA 3+ (A) Negative/Trace   Glucose, UA 2+ (A) Negative   Ketones, UA Trace (A) Negative   RBC, UA 3+ (A) Negative   Bilirubin, UA Negative Negative   Urobilinogen, Ur 0.2 0.2 - 1.0 mg/dL   Nitrite, UA Negative Negative   Microscopic Examination See below:     Comment: Microscopic was indicated and was performed.  CULTURE, URINE COMPREHENSIVE     Status: Abnormal   Collection Time: 06/17/23 12:59 PM   Specimen: Urine   CU  Result Value Ref Range   Urine Culture, Comprehensive Final report (A)    Organism ID, Bacteria Escherichia coli (A)     Comment: Multi-Drug Resistant Organism 50,000-100,000 colony forming units per mL    ANTIMICROBIAL SUSCEPTIBILITY Comment     Comment:       ** S = Susceptible; I = Intermediate; R = Resistant **                    P = Positive; N = Negative             MICS are expressed in micrograms per mL    Antibiotic                 RSLT#1    RSLT#2    RSLT#3    RSLT#4 Amoxicillin/Clavulanic Acid    I Ampicillin                     R Cefazolin                      R Cefepime  I Cefoxitin                      I Cefpodoxime                    R Ceftriaxone                     R Ciprofloxacin                   R Ertapenem                      S Gentamicin                     S Levofloxacin                    R Meropenem                      S Nitrofurantoin                  S Piperacillin/Tazobactam        S Tetracycline                   S Tobramycin                     S Trimethoprim /Sulfa              S   Microscopic Examination     Status: Abnormal   Collection Time: 06/17/23 12:59 PM   Urine  Result Value Ref Range   WBC, UA >30 (A) 0 - 5 /hpf   RBC, Urine >30 (A) 0 - 2  /hpf   Epithelial Cells (non renal) 0-10 0 - 10 /hpf   Mucus, UA Present (A) Not Estab.   Bacteria, UA Many (A) None seen/Few  BLADDER SCAN AMB NON-IMAGING     Status: None   Collection Time: 07/01/23  3:43 PM  Result Value Ref Range   Scan Result 0ml   POCT CBG (Fasting - Glucose)     Status: Abnormal   Collection Time: 07/13/23  9:49 AM  Result Value Ref Range   Glucose Fasting, POC 162 (A) 70 - 99 mg/dL  POC CREATINE & ALBUMIN,URINE     Status: Abnormal   Collection Time: 07/13/23 10:23 AM  Result Value Ref Range   Microalbumin Ur, POC 150 mg/L   Creatinine, POC 50 mg/dL   Albumin/Creatinine Ratio, Urine, POC >300   POCT Urinalysis Dipstick (18997)     Status: Abnormal   Collection Time: 07/13/23 10:24 AM  Result Value Ref Range   Color, UA Yellow    Clarity, UA Clear    Glucose, UA Positive (A) Negative   Bilirubin, UA Negative    Ketones, UA Negative    Spec Grav, UA 1.015 1.010 - 1.025   Blood, UA Trace    pH, UA 5.5 5.0 - 8.0   Protein, UA Positive (A) Negative   Urobilinogen, UA 0.2 0.2 or 1.0 E.U./dL   Nitrite, UA Negative    Leukocytes, UA Negative Negative   Appearance Clear    Odor Yes   Lipid panel     Status: Abnormal   Collection Time: 07/13/23 10:27 AM  Result Value Ref Range   Cholesterol, Total 173 100 - 199 mg/dL   Triglycerides 777 (H) 0 - 149 mg/dL   HDL 65 >60 mg/dL  VLDL Cholesterol Cal 36 5 - 40 mg/dL   LDL Chol Calc (NIH) 72 0 - 99 mg/dL   Chol/HDL Ratio 2.7 0.0 - 4.4 ratio    Comment:                                   T. Chol/HDL Ratio                                             Men  Women                               1/2 Avg.Risk  3.4    3.3                                   Avg.Risk  5.0    4.4                                2X Avg.Risk  9.6    7.1                                3X Avg.Risk 23.4   11.0   VITAMIN D  25 Hydroxy (Vit-D Deficiency, Fractures)     Status: Abnormal   Collection Time: 07/13/23 10:27 AM  Result Value Ref Range    Vit D, 25-Hydroxy 29.8 (L) 30.0 - 100.0 ng/mL    Comment: Vitamin D  deficiency has been defined by the Institute of Medicine and an Endocrine Society practice guideline as a level of serum 25-OH vitamin D  less than 20 ng/mL (1,2). The Endocrine Society went on to further define vitamin D  insufficiency as a level between 21 and 29 ng/mL (2). 1. IOM (Institute of Medicine). 2010. Dietary reference    intakes for calcium  and D. [place]: The    [employer]. 2. Holick MF, Binkley Alma, Bischoff-Ferrari HA, et al.    Evaluation, treatment, and prevention of vitamin D     deficiency: an Endocrine Society clinical practice    guideline. JCEM. 2011 Jul; 96(7):1911-30.   CMP14+EGFR     Status: Abnormal   Collection Time: 07/13/23 10:27 AM  Result Value Ref Range   Glucose 116 (H) 70 - 99 mg/dL   BUN 38 (H) 8 - 27 mg/dL   Creatinine, Ser 7.80 (H) 0.57 - 1.00 mg/dL   eGFR 24 (L) >40 fO/fpw/8.26   BUN/Creatinine Ratio 17 12 - 28   Sodium 137 134 - 144 mmol/L   Potassium 4.3 3.5 - 5.2 mmol/L   Chloride 100 96 - 106 mmol/L   CO2 18 (L) 20 - 29 mmol/L   Calcium  9.1 8.7 - 10.3 mg/dL   Total Protein 7.2 6.0 - 8.5 g/dL   Albumin 4.3 3.9 - 4.9 g/dL   Globulin, Total 2.9 1.5 - 4.5 g/dL   Bilirubin Total <9.7 0.0 - 1.2 mg/dL   Alkaline Phosphatase 310 (H) 44 - 121 IU/L   AST 39 0 - 40 IU/L   ALT 24 0 - 32 IU/L  TSH     Status: None  Collection Time: 07/13/23 10:27 AM  Result Value Ref Range   TSH 2.430 0.450 - 4.500 uIU/mL  Hemoglobin A1c     Status: Abnormal   Collection Time: 07/13/23 10:27 AM  Result Value Ref Range   Hgb A1c MFr Bld 5.7 (H) 4.8 - 5.6 %    Comment:          Prediabetes: 5.7 - 6.4          Diabetes: >6.4          Glycemic control for adults with diabetes: <7.0    Est. average glucose Bld gHb Est-mCnc 117 mg/dL  Vitamin B12     Status: None   Collection Time: 07/13/23 10:27 AM  Result Value Ref Range   Vitamin B-12 529 232 - 1,245 pg/mL  CBC with  Diff     Status: Abnormal   Collection Time: 07/13/23 10:27 AM  Result Value Ref Range   WBC 7.0 3.4 - 10.8 x10E3/uL   RBC 2.91 (L) 3.77 - 5.28 x10E6/uL   Hemoglobin 9.1 (L) 11.1 - 15.9 g/dL   Hematocrit 71.7 (L) 65.9 - 46.6 %   MCV 97 79 - 97 fL   MCH 31.3 26.6 - 33.0 pg   MCHC 32.3 31.5 - 35.7 g/dL   RDW 86.6 88.2 - 84.5 %   Platelets 269 150 - 450 x10E3/uL   Neutrophils 54 Not Estab. %   Lymphs 35 Not Estab. %   Monocytes 9 Not Estab. %   Eos 1 Not Estab. %   Basos 1 Not Estab. %   Neutrophils Absolute 3.8 1.4 - 7.0 x10E3/uL   Lymphocytes Absolute 2.5 0.7 - 3.1 x10E3/uL   Monocytes Absolute 0.6 0.1 - 0.9 x10E3/uL   EOS (ABSOLUTE) 0.1 0.0 - 0.4 x10E3/uL   Basophils Absolute 0.1 0.0 - 0.2 x10E3/uL   Immature Granulocytes 0 Not Estab. %   Immature Grans (Abs) 0.0 0.0 - 0.1 x10E3/uL  Urinalysis, Routine w reflex microscopic     Status: Abnormal   Collection Time: 07/13/23  3:30 PM  Result Value Ref Range   Specific Gravity, UA 1.013 1.005 - 1.030   pH, UA 6.0 5.0 - 7.5   Color, UA Yellow Yellow   Appearance Ur Clear Clear   Leukocytes,UA Negative Negative   Protein,UA 3+ (A) Negative/Trace   Glucose, UA 2+ (A) Negative   Ketones, UA Negative Negative   RBC, UA Trace (A) Negative   Bilirubin, UA Negative Negative   Urobilinogen, Ur 0.2 0.2 - 1.0 mg/dL   Nitrite, UA Negative Negative   Microscopic Examination See below:     Comment: Microscopic was indicated and was performed.  Microscopic Examination     Status: Abnormal   Collection Time: 07/13/23  3:30 PM  Result Value Ref Range   WBC, UA 0-5 0 - 5 /hpf   RBC, Urine None seen 0 - 2 /hpf   Epithelial Cells (non renal) None seen 0 - 10 /hpf   Casts None seen None seen /lpf   Bacteria, UA None seen None seen/Few   Yeast, UA Present (A) None seen  Urine Culture     Status: Abnormal   Collection Time: 07/13/23  3:31 PM   Specimen: Urine, Clean Catch   UR  Result Value Ref Range   Urine Culture, Routine Final report  (A)    Organism ID, Bacteria Escherichia coli (A)     Comment: Susceptibility profile is consistent with a probable ESBL. Multi-Drug Resistant Organism Greater than 100,000  colony forming units per mL    Antimicrobial Susceptibility Comment     Comment:       ** S = Susceptible; I = Intermediate; R = Resistant **                    P = Positive; N = Negative             MICS are expressed in micrograms per mL    Antibiotic                 RSLT#1    RSLT#2    RSLT#3    RSLT#4 Amoxicillin/Clavulanic Acid    I Ampicillin                     R Cefazolin                      R Cefepime                       I Cefoxitin                      S Cefpodoxime                    R Ceftriaxone                     R Ciprofloxacin                   R Ertapenem                      S Gentamicin                     S Levofloxacin                    R Meropenem                      S Nitrofurantoin                  S Piperacillin/Tazobactam        S Tetracycline                   S Tobramycin                     S Trimethoprim /Sulfa              R   Lipase, blood     Status: Abnormal   Collection Time: 07/14/23  9:26 AM  Result Value Ref Range   Lipase 64 (H) 11 - 51 U/L    Comment: Performed at [facility], [address] [place], [place] [address]  Comprehensive metabolic panel     Status: Abnormal   Collection Time: 07/14/23  9:26 AM  Result Value Ref Range   Sodium 136 135 - 145 mmol/L   Potassium 4.4 3.5 - 5.1 mmol/L   Chloride 102 98 - 111 mmol/L   CO2 23 22 - 32 mmol/L   Glucose, Bld 103 (H) 70 - 99 mg/dL    Comment: Glucose reference range applies only to samples taken after fasting for at least 8 hours.   BUN 37 (H) 8 - 23 mg/dL   Creatinine, Ser 7.82 (H) 0.44 - 1.00 mg/dL   Calcium  8.9 8.9 - 10.3 mg/dL  Total Protein 7.1 6.5 - 8.1 g/dL   Albumin 3.8 3.5 - 5.0 g/dL   AST 52 (H) 15 - 41 U/L   ALT 27 0 - 44 U/L   Alkaline Phosphatase 226 (H) 38 - 126 U/L   Total  Bilirubin 0.7 0.0 - 1.2 mg/dL   GFR, Estimated 25 (L) >60 mL/min    Comment: (NOTE) Calculated using the CKD-EPI Creatinine Equation (2021)    Anion gap 11 5 - 15    Comment: Performed at [facility], [address] [place], [place] [address]  CBC     Status: Abnormal   Collection Time: 07/14/23  9:26 AM  Result Value Ref Range   WBC 6.6 4.0 - 10.5 K/uL   RBC 2.96 (L) 3.87 - 5.11 MIL/uL   Hemoglobin 9.1 (L) 12.0 - 15.0 g/dL   HCT 72.7 (L) 63.9 - 53.9 %   MCV 91.9 80.0 - 100.0 fL   MCH 30.7 26.0 - 34.0 pg   MCHC 33.5 30.0 - 36.0 g/dL   RDW 86.8 88.4 - 84.4 %   Platelets 261 150 - 400 K/uL   nRBC 0.0 0.0 - 0.2 %    Comment: Performed at [facility], [address] [place], [place] [address]  Type and screen [facility]     Status: None   Collection Time: 07/14/23  9:26 AM  Result Value Ref Range   ABO/RH(D) B POS    Antibody Screen POS    Sample Expiration 07/17/2023,2359    Antibody Identification WARM AUTOANTIBODY    DAT, IgG NEG    DAT, complement      NEG Performed at [facility], [address] [place], [place] [address]   Protime-INR     Status: None   Collection Time: 07/14/23  1:16 PM  Result Value Ref Range   Prothrombin Time 13.2 11.4 - 15.2 seconds   INR 0.9 0.8 - 1.2    Comment: (NOTE) INR goal varies based on device and disease states. Performed at [facility], [address] [place], [place] [address]        Assessment & Plan Acute cystitis with hematuria Urinary retention Suprapubic catheter dysfunction, initial encounter [facility]) Patient is stable, has follow up appointment with urology already.  Will defer to them for further treatment decisions.      Return as previously scheduled unless further complications.   Total time spent: 20 minutes  ALAN CHRISTELLA ARRANT, FNP  06/01/2023   This document may have been prepared by [facility] Voice Recognition software and as such may include  unintentional dictation errors.

## 2023-08-16 ENCOUNTER — Encounter: Payer: Self-pay | Admitting: Physician Assistant

## 2023-08-16 ENCOUNTER — Ambulatory Visit: Admitting: Physician Assistant

## 2023-08-16 VITALS — BP 106/83 | HR 65 | Ht 60.0 in | Wt 115.0 lb

## 2023-08-16 DIAGNOSIS — R339 Retention of urine, unspecified: Secondary | ICD-10-CM

## 2023-08-16 DIAGNOSIS — Z435 Encounter for attention to cystostomy: Secondary | ICD-10-CM

## 2023-08-16 NOTE — Progress Notes (Signed)
 Suprapubic Cath Change  Patient is present today for a suprapubic catheter change due to urinary retention.  8ml of water was drained from the balloon, a 16 silastic FR foley cath was removed from the tract with out difficulty.  Site was cleaned and prepped in a sterile fashion with betadine.  A 16FR foley cath was replaced into the tract no complications were noted. Urine return was noted, 10 ml of sterile water was inflated into the balloon and a leg bag was attached for drainage.  Patient tolerated well. A night bag was given to patient and proper instruction was given on how to switch bags.    Performed by: Adisson Deak Magallon-Mariche, RMA  Follow up: 1 month follow up

## 2023-08-16 NOTE — Progress Notes (Deleted)
 Cath Change/ Replacement  Patient is present today for a catheter change due to urinary retention.  ***ml of water was removed from the balloon, a ***FR foley cath was removed without difficulty.  Patient was cleaned and prepped in a sterile fashion with betadine and 2% lidocaine  jelly was instilled into the urethra. A *** FR foley cath was replaced into the bladder, {dnt complications:20057}. Urine return was noted ***ml and urine was *** in color. The balloon was filled with 10ml of sterile water. A *** bag was attached for drainage.  A night bag was also given to the patient and patient was given instruction on how to change from one bag to another. Patient was given proper instruction on catheter care.    Performed by: ***  Follow up: No follow-ups on file.

## 2023-08-17 ENCOUNTER — Ambulatory Visit: Payer: Self-pay

## 2023-08-26 ENCOUNTER — Other Ambulatory Visit: Payer: Self-pay

## 2023-08-26 ENCOUNTER — Encounter: Payer: Self-pay | Admitting: Emergency Medicine

## 2023-08-26 ENCOUNTER — Emergency Department

## 2023-08-26 ENCOUNTER — Emergency Department
Admission: EM | Admit: 2023-08-26 | Discharge: 2023-08-26 | Disposition: A | Discharge status: Home / Self Care | Attending: Emergency Medicine | Admitting: Emergency Medicine

## 2023-08-26 DIAGNOSIS — N189 Chronic kidney disease, unspecified: Secondary | ICD-10-CM | POA: Diagnosis not present

## 2023-08-26 DIAGNOSIS — J069 Acute upper respiratory infection, unspecified: Secondary | ICD-10-CM | POA: Diagnosis not present

## 2023-08-26 DIAGNOSIS — E1122 Type 2 diabetes mellitus with diabetic chronic kidney disease: Secondary | ICD-10-CM | POA: Diagnosis not present

## 2023-08-26 DIAGNOSIS — R059 Cough, unspecified: Secondary | ICD-10-CM | POA: Diagnosis present

## 2023-08-26 DIAGNOSIS — I129 Hypertensive chronic kidney disease with stage 1 through stage 4 chronic kidney disease, or unspecified chronic kidney disease: Secondary | ICD-10-CM | POA: Diagnosis not present

## 2023-08-26 DIAGNOSIS — B9789 Other viral agents as the cause of diseases classified elsewhere: Secondary | ICD-10-CM | POA: Diagnosis not present

## 2023-08-26 LAB — RESP PANEL BY RT-PCR (RSV, FLU A&B, COVID)  RVPGX2
Influenza A by PCR: NEGATIVE
Influenza B by PCR: NEGATIVE
Resp Syncytial Virus by PCR: NEGATIVE
SARS Coronavirus 2 by RT PCR: NEGATIVE

## 2023-08-26 MED ORDER — PROMETHAZINE-DM 6.25-15 MG/5ML PO SYRP: 5.0000 mL | ORAL_SOLUTION | Freq: Four times a day (QID) | ORAL | 0 refills | Status: DC | PRN

## 2023-08-26 NOTE — Discharge Instructions (Addendum)
 Call your primary care provider if any continued problems or not improving.  Drink lots of fluids to stay hydrated.  A prescription for cough was sent to the pharmacy.  This medication also could cause drowsiness and increase your risk for falling but should help with you resting and with nausea.

## 2023-08-26 NOTE — ED Provider Notes (Signed)
 [facility] Provider Note    None    (approximate)   History   Cough   HPI  Karen [name] is a 68 y.o. female the ED with complaint of a productive cough for the last 4 days.  Patient states that she is unable to sleep due to the cough.  She denies any fever or chills.  No known sick contacts.  Patient has history of hypertension, diabetes type 2, proteinuria, abnormal LFTs, GERD, CKD, urinary retention and chronic pancreatitis.     Physical Exam   Triage Vital Signs: ED Triage Vitals  Encounter Vitals Group     BP 08/26/23 0651 (!) 176/75     Girls Systolic BP Percentile --      Girls Diastolic BP Percentile --      Boys Systolic BP Percentile --      Boys Diastolic BP Percentile --      Pulse Rate 08/26/23 0651 79     Resp 08/26/23 0651 16     Temp 08/26/23 0651 97.8 F (36.6 C)     Temp Source 08/26/23 0651 Oral     SpO2 08/26/23 0651 100 %     Weight 08/26/23 0647 90 lb (40.8 kg)     Height 08/26/23 0647 5' 1 (1.549 m)     Head Circumference --      Peak Flow --      Pain Score 08/26/23 0654 0     Pain Loc --      Pain Education --      Exclude from Growth Chart --     Most recent vital signs: Vitals:   08/26/23 0651  BP: (!) 176/75  Pulse: 79  Resp: 16  Temp: 97.8 F (36.6 C)  SpO2: 100%     General: Awake, no distress.  Alert, talkative, able to answer questions without any difficulty. CV:  Good peripheral perfusion.  Heart regular rate rhythm. Resp:  Normal effort.  Lungs clear bilaterally. Abd:  No distention.  Other:     ED Results / Procedures / Treatments   Labs (all labs ordered are listed, but only abnormal results are displayed) Labs Reviewed  RESP PANEL BY RT-PCR (RSV, FLU A&B, COVID)  [id number]    RADIOLOGY Chest x-ray images were reviewed and interpreted by myself independent of the radiologist which was negative for acute cardiopulmonary findings.  Official radiology report is  negative.    PROCEDURES:  Critical Care performed:   Procedures   MEDICATIONS ORDERED IN ED: Medications - No data to display   IMPRESSION / MDM / ASSESSMENT AND PLAN / ED COURSE  I reviewed the triage vital signs and the nursing notes.   Differential diagnosis includes, but is not limited to, upper respiratory infection, COVID, influenza, RSV, viral illness, seasonal allergies.  68 year old female presents to the ED with complaint of upper respiratory symptoms for the last 4 days without history of fever.  Patient was made aware that her respiratory panel was negative and that her chest x-ray did not show any acute changes.  We discussed continuing fluids to stay hydrated.  A prescription for promethazine  dextromethorphan  was sent to the pharmacy for her to take as needed for cough and congestion.  Patient has to follow-up with her PCP if any continued problems.  Also recommended Tylenol  or ibuprofen  as needed for body aches as needed.      Patient's presentation is most consistent with acute complicated illness / injury requiring diagnostic workup.  FINAL CLINICAL IMPRESSION(S) / ED DIAGNOSES   Final diagnoses:  Viral URI with cough     Rx / DC Orders   ED Discharge Orders          Ordered    promethazine -dextromethorphan  (PROMETHAZINE -DM) 6.25-15 MG/5ML syrup  4 times daily PRN,   Status:  Discontinued        08/26/23 0813    promethazine -dextromethorphan  (PROMETHAZINE -DM) 6.25-15 MG/5ML syrup  4 times daily PRN        08/26/23 0813             Note:  This document was prepared using Dragon voice recognition software and may include unintentional dictation errors.   Saunders Shona CROME, PA-C 08/26/23 1245    Ernest Ronal BRAVO, MD 08/27/23 [phone]

## 2023-08-26 NOTE — ED Notes (Signed)
 See triage note Presents with a cough which started on Monday  States cough is occasionally  productive  Denies any fever

## 2023-08-26 NOTE — ED Triage Notes (Signed)
 Patient ambulatory to triage with steady gait, without difficulty or distress noted; pt reports since Monday having prod cough white sputum; denies fever

## 2023-08-28 ENCOUNTER — Emergency Department
Admission: EM | Admit: 2023-08-28 | Discharge: 2023-08-28 | Disposition: A | Discharge status: Home / Self Care | Attending: Emergency Medicine | Admitting: Emergency Medicine

## 2023-08-28 ENCOUNTER — Emergency Department

## 2023-08-28 ENCOUNTER — Other Ambulatory Visit: Payer: Self-pay

## 2023-08-28 DIAGNOSIS — L03311 Cellulitis of abdominal wall: Secondary | ICD-10-CM | POA: Insufficient documentation

## 2023-08-28 DIAGNOSIS — N189 Chronic kidney disease, unspecified: Secondary | ICD-10-CM | POA: Diagnosis not present

## 2023-08-28 DIAGNOSIS — R8289 Other abnormal findings on cytological and histological examination of urine: Secondary | ICD-10-CM | POA: Insufficient documentation

## 2023-08-28 DIAGNOSIS — R748 Abnormal levels of other serum enzymes: Secondary | ICD-10-CM | POA: Diagnosis not present

## 2023-08-28 DIAGNOSIS — L02211 Cutaneous abscess of abdominal wall: Secondary | ICD-10-CM | POA: Insufficient documentation

## 2023-08-28 DIAGNOSIS — R1010 Upper abdominal pain, unspecified: Secondary | ICD-10-CM | POA: Diagnosis present

## 2023-08-28 LAB — URINALYSIS, ROUTINE W REFLEX MICROSCOPIC
Bilirubin Urine: NEGATIVE
Glucose, UA: >=500 mg/dL — AB
Hgb urine dipstick: NEGATIVE
Ketones, ur: NEGATIVE mg/dL
Nitrite: NEGATIVE
Protein, ur: 100 mg/dL — AB
Specific Gravity, Urine: 1.003 — ABNORMAL LOW (ref 1.005–1.030)
Squamous Epithelial / HPF: 0 /HPF (ref 0–5)
pH: 5 (ref 5.0–8.0)

## 2023-08-28 LAB — COMPREHENSIVE METABOLIC PANEL WITH GFR
ALT: 25 U/L (ref 0–44)
AST: 34 U/L (ref 15–41)
Albumin: 4 g/dL (ref 3.5–5.0)
Alkaline Phosphatase: 258 U/L — ABNORMAL HIGH (ref 38–126)
Anion gap: 16 — ABNORMAL HIGH (ref 5–15)
BUN: 23 mg/dL (ref 8–23)
CO2: 22 mmol/L (ref 22–32)
Calcium: 9.4 mg/dL (ref 8.9–10.3)
Chloride: 101 mmol/L (ref 98–111)
Creatinine, Ser: 2.24 mg/dL — ABNORMAL HIGH (ref 0.44–1.00)
GFR, Estimated: 24 mL/min — ABNORMAL LOW (ref >60)
Glucose, Bld: 143 mg/dL — ABNORMAL HIGH (ref 70–99)
Potassium: 3.4 mmol/L — ABNORMAL LOW (ref 3.5–5.1)
Sodium: 139 mmol/L (ref 135–145)
Total Bilirubin: 0.3 mg/dL (ref 0.0–1.2)
Total Protein: 7.7 g/dL (ref 6.5–8.1)

## 2023-08-28 LAB — CBC
HCT: 32.8 % — ABNORMAL LOW (ref 36.0–46.0)
Hemoglobin: 11.4 g/dL — ABNORMAL LOW (ref 12.0–15.0)
MCH: 30.8 pg (ref 26.0–34.0)
MCHC: 34.8 g/dL (ref 30.0–36.0)
MCV: 88.6 fL (ref 80.0–100.0)
Platelets: 266 K/uL (ref 150–400)
RBC: 3.7 MIL/uL — ABNORMAL LOW (ref 3.87–5.11)
RDW: 11.9 % (ref 11.5–15.5)
WBC: 9.4 K/uL (ref 4.0–10.5)
nRBC: 0 % (ref 0.0–0.2)

## 2023-08-28 LAB — LIPASE, BLOOD: Lipase: 76 U/L — ABNORMAL HIGH (ref 11–51)

## 2023-08-28 MED ORDER — LIDOCAINE HCL (PF) 1 % IJ SOLN
5.0000 mL | Freq: Once | INTRAMUSCULAR | Status: AC
  Administered 2023-08-28: 5 mL
  Filled 2023-08-28: qty 5

## 2023-08-28 MED ORDER — ONDANSETRON HCL 4 MG/2ML IJ SOLN
4.0000 mg | Freq: Once | INTRAMUSCULAR | Status: AC
  Administered 2023-08-28: 4 mg via INTRAVENOUS
  Filled 2023-08-28: qty 2

## 2023-08-28 MED ORDER — MORPHINE SULFATE (PF) 4 MG/ML IV SOLN
4.0000 mg | Freq: Once | INTRAVENOUS | Status: AC
  Administered 2023-08-28: 4 mg via INTRAVENOUS
  Filled 2023-08-28: qty 1

## 2023-08-28 MED ORDER — SODIUM CHLORIDE 0.9 % IV BOLUS
1000.0000 mL | Freq: Once | INTRAVENOUS | Status: AC
  Administered 2023-08-28: 1000 mL via INTRAVENOUS

## 2023-08-28 MED ORDER — CLINDAMYCIN HCL 150 MG PO CAPS
300.0000 mg | ORAL_CAPSULE | Freq: Once | ORAL | Status: AC
  Administered 2023-08-28: 300 mg via ORAL
  Filled 2023-08-28: qty 2

## 2023-08-28 MED ORDER — CLINDAMYCIN HCL 300 MG PO CAPS: 300.0000 mg | ORAL_CAPSULE | Freq: Three times a day (TID) | ORAL | 0 refills | Status: AC

## 2023-08-28 NOTE — Discharge Instructions (Signed)
 Take the antibiotic as prescribed and finish the full 1 week course.  Make an appointment to follow-up with urology as soon as possible.  You may replace the dressing with a smaller dressing such as a Band-Aid or small gauze tomorrow.  Keep it clean.  You can apply bacitracin or Neosporin ointment to the wound.  Return to the ER for new, worsening, or persistent severe pain, swelling of the abdomen or around the catheter site, drainage of pus, fever, vomiting, weakness, or any other new or worsening symptoms that concern you.

## 2023-08-28 NOTE — ED Notes (Signed)
 Pt to CT

## 2023-08-28 NOTE — ED Provider Notes (Signed)
 [facility] Provider Note    Event Date/Time   First MD Initiated Contact with Patient 08/28/23 1604     (approximate)   History   suprapubic catheter problem   HPI  Karen [name] is a 68 y.o. female with a history of urinary retention status post suprapubic catheter who presents with abdominal pain over the last day.  The patient states he initially had abdominal pain in the suprapubic area right around her suprapubic catheter.  She states that over the last day a small bump is also come up next to the catheter which is acutely painful.  She separately had pain develop over her middle and upper abdomen.  She has nausea but no vomiting.  She denies any diarrhea.  The urine coming out of the catheter is clear.  I reviewed the past medical records.  The patient was seen in the ED 2 days ago with a cough.  She was thought to have a viral URI.  Workup was reassuring.  He was last seen by urology on 7/10 for suprapubic catheter change.   Physical Exam   Triage Vital Signs: ED Triage Vitals  Encounter Vitals Group     BP 08/28/23 1518 121/88     Girls Systolic BP Percentile --      Girls Diastolic BP Percentile --      Boys Systolic BP Percentile --      Boys Diastolic BP Percentile --      Pulse Rate 08/28/23 1518 84     Resp 08/28/23 1518 20     Temp 08/28/23 1518 98.3 F (36.8 C)     Temp Source 08/28/23 1518 Oral     SpO2 08/28/23 1518 100 %     Weight 08/28/23 1520 90 lb 6.2 oz (41 kg)     Height 08/28/23 1520 5' 1 (1.549 m)     Head Circumference --      Peak Flow --      Pain Score 08/28/23 1519 8     Pain Loc --      Pain Education --      Exclude from Growth Chart --     Most recent vital signs: Vitals:   08/28/23 1900 08/28/23 1930  BP: (!) 160/75 (!) 162/77  Pulse: 77 77  Resp: 18   Temp:    SpO2: 100% 100%     General: Awake, no distress.  CV:  Good peripheral perfusion.  Resp:  Normal effort.  Abd:  Soft with moderate  bilateral lower quadrant tenderness.  No distention.  Other:  Approximately 1.5 cm erythematous, tender skin nodule directly adjacent to the suprapubic catheter.  No surrounding erythema, induration, or abnormal warmth.  No purulent drainage.   ED Results / Procedures / Treatments   Labs (all labs ordered are listed, but only abnormal results are displayed) Labs Reviewed  LIPASE, BLOOD - Abnormal; Notable for the following components:      Result Value   Lipase 76 (*)    All other components within normal limits  COMPREHENSIVE METABOLIC PANEL WITH GFR - Abnormal; Notable for the following components:   Potassium 3.4 (*)    Glucose, Bld 143 (*)    Creatinine, Ser 2.24 (*)    Alkaline Phosphatase 258 (*)    GFR, Estimated 24 (*)    Anion gap 16 (*)    All other components within normal limits  CBC - Abnormal; Notable for the following components:   RBC 3.70 (*)  Hemoglobin 11.4 (*)    HCT 32.8 (*)    All other components within normal limits  URINALYSIS, ROUTINE W REFLEX MICROSCOPIC - Abnormal; Notable for the following components:   Color, Urine STRAW (*)    APPearance HAZY (*)    Specific Gravity, Urine 1.003 (*)    Glucose, UA >=500 (*)    Protein, ur 100 (*)    Leukocytes,Ua TRACE (*)    Bacteria, UA MANY (*)    All other components within normal limits     EKG     RADIOLOGY  CT abdomen/pelvis: I independently viewed and interpreted the images; there are no dilated bowel loops or any free air or free fluid.  Radiology report indicates the following:  IMPRESSION:  1. Suprapubic catheter in place. Fat stranding and soft tissue  thickening is noted in the subcutaneous tissues about the catheter  which is new from the previous exam. There is a 4 mm hypodensity in  the subcutaneous tissues in this region, and the possibility of  abscess formation cannot be excluded.  2. Nonobstructive right renal calculus. No hydronephrosis  bilaterally.  3. Moderate amount of  retained stool in the colon.  4. Diverticulosis without diverticulitis.  5. Aortic atherosclerosis.     PROCEDURES:  Critical Care performed: No  .Incision and Drainage  Date/Time: 08/28/2023 7:53 PM  Performed by: Jacolyn Pae, MD Authorized by: Jacolyn Pae, MD   Consent:    Consent obtained:  Verbal   Consent given by:  Patient   Risks discussed:  Bleeding, incomplete drainage, pain and infection Universal protocol:    Patient identity confirmed:  Verbally with patient Location:    Type:  Abscess   Size:  1cm   Location:  Trunk   Trunk location:  Abdomen Anesthesia:    Anesthesia method:  Local infiltration   Local anesthetic:  Lidocaine  1% w/o epi Procedure type:    Complexity:  Simple Procedure details:    Incision types:  Stab incision   Drainage:  Purulent   Drainage amount:  Moderate   Wound treatment:  Wound left open   Packing materials:  None Post-procedure details:    Procedure completion:  Tolerated well, no immediate complications    MEDICATIONS ORDERED IN ED: Medications  ondansetron  (ZOFRAN ) injection 4 mg (4 mg Intravenous Given 08/28/23 1808)  morphine  (PF) 4 MG/ML injection 4 mg (4 mg Intravenous Given 08/28/23 1808)  sodium chloride  0.9 % bolus 1,000 mL (0 mLs Intravenous Stopped 08/28/23 1940)  lidocaine  (PF) (XYLOCAINE ) 1 % injection 5 mL (5 mLs Other Given by Other 08/28/23 1940)  clindamycin  (CLEOCIN ) capsule 300 mg (300 mg Oral Given 08/28/23 1957)     IMPRESSION / MDM / ASSESSMENT AND PLAN / ED COURSE  I reviewed the triage vital signs and the nursing notes.  68 year old female with PMH as noted above presents with abdominal pain, nausea, as well as a small skin nodule that has popped up next to her suprapubic catheter.  Differential diagnosis includes, but is not limited to, UTI/cystitis, catheter obstruction, cellulitis, other soft tissue infection, colitis, diverticulitis, gastroenteritis.  Urinalysis shows bacteria and  some WBCs but no specific findings to suggest acute UTI.  CBC is unremarkable.  There is no leukocytosis.  CMP shows elevated alkaline phosphatase which is chronic, chronic kidney disease, and a borderline elevated anion gap.  We will obtain CT for further evaluation and give fluids and analgesia.  Patient's presentation is most consistent with acute complicated illness / injury requiring diagnostic  workup.  ----------------------------------------- 7:54 PM on 08/28/2023 -----------------------------------------  CT shows likely abdominal wall cellulitis and a small abscess adjacent to the suprapubic catheter, corresponding to the nodule seen on exam.  I consulted Dr. Shane from urology who recommended I&D and oral antibiotics, with outpatient urology follow-up.  I&D was performed successfully and returned a moderate amount of purulent drainage..    At this time, the patient is stable for discharge home.  I counseled her on the results of the workup and plan of care.  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  I gave strict return precautions, and she expressed understanding.  She agrees to follow-up with urology.   FINAL CLINICAL IMPRESSION(S) / ED DIAGNOSES   Final diagnoses:  Abdominal wall abscess  Abdominal wall cellulitis     Rx / DC Orders   ED Discharge Orders          Ordered    clindamycin  (CLEOCIN ) 300 MG capsule  3 times daily        08/28/23 1952             Note:  This document was prepared using Dragon voice recognition software and may include unintentional dictation errors.    Jacolyn Pae, MD 08/28/23 [phone]

## 2023-08-28 NOTE — ED Triage Notes (Signed)
 Pt to ED for tenderness and swelling to area below SP catheter since yesterday. Skin does not look red. Area not grossly swollen. Pt states also having lower abdominal cramping. Urine draining clear per pt.

## 2023-08-31 ENCOUNTER — Other Ambulatory Visit: Payer: Self-pay | Admitting: Family

## 2023-09-02 ENCOUNTER — Other Ambulatory Visit: Payer: Self-pay | Admitting: Family

## 2023-09-07 ENCOUNTER — Telehealth: Payer: Self-pay

## 2023-09-07 ENCOUNTER — Other Ambulatory Visit: Payer: Self-pay

## 2023-09-07 NOTE — Telephone Encounter (Signed)
Pt informed

## 2023-09-07 NOTE — Telephone Encounter (Signed)
 Pt called saying she needs a refill on all her pain meds but the pharmacy said it was denied even though she's been on it for 10+ years. Please advise.

## 2023-09-15 ENCOUNTER — Other Ambulatory Visit: Payer: Self-pay | Admitting: Family

## 2023-09-16 ENCOUNTER — Ambulatory Visit (INDEPENDENT_AMBULATORY_CARE_PROVIDER_SITE_OTHER): Admitting: Physician Assistant

## 2023-09-16 DIAGNOSIS — Z435 Encounter for attention to cystostomy: Secondary | ICD-10-CM | POA: Diagnosis not present

## 2023-09-16 DIAGNOSIS — L02211 Cutaneous abscess of abdominal wall: Secondary | ICD-10-CM | POA: Diagnosis not present

## 2023-09-16 MED ORDER — MUPIROCIN 2 % EX OINT: 1.0000 | TOPICAL_OINTMENT | Freq: Two times a day (BID) | CUTANEOUS | 0 refills | Status: AC

## 2023-09-16 NOTE — Progress Notes (Signed)
 Suprapubic Cath Change  Patient is present today for a suprapubic catheter change due to urinary retention.  8ml of water was drained from the balloon, a 16FR foley cath was removed from the tract without difficulty.  Site was cleaned and prepped in a sterile fashion with betadine.  A 16FR Silastic foley cath was replaced into the tract no complications were noted. Urine return was noted, 10 ml of sterile water was inflated into the balloon and a leg bag was attached for drainage.  Patient tolerated well.    Performed by: Trenten Watchman, PA-C    Additional notes: She had an I&D of an abdominal abscess adjacent to her SPT in the ED on 08/28/2023. She was discharged on clindamycin . Today she reports she completed the antibiotic. There is a small, ~58mm diameter abscess with no active drainage at the right lateral margin of her SP tract on arrival today. No surrounding edema or erythema. In prepping her with betadine, this spontaneously drained some purulent material and appeared to empty fully. I'm prescribing topical mupirocin  to treat any residual infection.  Follow up: Return in about 4 weeks (around 10/14/2023) for SPT exchange.

## 2023-09-25 ENCOUNTER — Emergency Department
Admission: EM | Admit: 2023-09-25 | Discharge: 2023-09-25 | Disposition: A | Discharge status: Home / Self Care | Attending: Emergency Medicine | Admitting: Emergency Medicine

## 2023-09-25 ENCOUNTER — Other Ambulatory Visit: Payer: Self-pay

## 2023-09-25 ENCOUNTER — Encounter: Payer: Self-pay | Admitting: *Deleted

## 2023-09-25 DIAGNOSIS — D649 Anemia, unspecified: Secondary | ICD-10-CM | POA: Diagnosis not present

## 2023-09-25 DIAGNOSIS — E1122 Type 2 diabetes mellitus with diabetic chronic kidney disease: Secondary | ICD-10-CM | POA: Diagnosis not present

## 2023-09-25 DIAGNOSIS — I129 Hypertensive chronic kidney disease with stage 1 through stage 4 chronic kidney disease, or unspecified chronic kidney disease: Secondary | ICD-10-CM | POA: Insufficient documentation

## 2023-09-25 DIAGNOSIS — R1030 Lower abdominal pain, unspecified: Secondary | ICD-10-CM | POA: Diagnosis present

## 2023-09-25 DIAGNOSIS — N183 Chronic kidney disease, stage 3 unspecified: Secondary | ICD-10-CM | POA: Insufficient documentation

## 2023-09-25 DIAGNOSIS — R748 Abnormal levels of other serum enzymes: Secondary | ICD-10-CM | POA: Diagnosis not present

## 2023-09-25 DIAGNOSIS — N3001 Acute cystitis with hematuria: Secondary | ICD-10-CM | POA: Insufficient documentation

## 2023-09-25 LAB — URINALYSIS, ROUTINE W REFLEX MICROSCOPIC
Bilirubin Urine: NEGATIVE
Bilirubin Urine: NEGATIVE
Glucose, UA: >=500 mg/dL — AB
Glucose, UA: >=500 mg/dL — AB
Ketones, ur: NEGATIVE mg/dL
Ketones, ur: NEGATIVE mg/dL
Nitrite: NEGATIVE
Nitrite: NEGATIVE
Protein, ur: 100 mg/dL — AB
Protein, ur: 100 mg/dL — AB
Specific Gravity, Urine: 1.003 — ABNORMAL LOW (ref 1.005–1.030)
Specific Gravity, Urine: 1.001 — ABNORMAL LOW (ref 1.005–1.030)
Squamous Epithelial / HPF: 0 /HPF (ref 0–5)
pH: 5 (ref 5.0–8.0)
pH: 6 (ref 5.0–8.0)

## 2023-09-25 LAB — COMPREHENSIVE METABOLIC PANEL WITH GFR
ALT: 37 U/L (ref 0–44)
AST: 76 U/L — ABNORMAL HIGH (ref 15–41)
Albumin: 3.6 g/dL (ref 3.5–5.0)
Alkaline Phosphatase: 270 U/L — ABNORMAL HIGH (ref 38–126)
Anion gap: 14 (ref 5–15)
BUN: 38 mg/dL — ABNORMAL HIGH (ref 8–23)
CO2: 24 mmol/L (ref 22–32)
Calcium: 8.6 mg/dL — ABNORMAL LOW (ref 8.9–10.3)
Chloride: 99 mmol/L (ref 98–111)
Creatinine, Ser: 2.27 mg/dL — ABNORMAL HIGH (ref 0.44–1.00)
GFR, Estimated: 23 mL/min — ABNORMAL LOW (ref >60)
Glucose, Bld: 93 mg/dL (ref 70–99)
Potassium: 3.9 mmol/L (ref 3.5–5.1)
Sodium: 137 mmol/L (ref 135–145)
Total Bilirubin: 0.5 mg/dL (ref 0.0–1.2)
Total Protein: 7.2 g/dL (ref 6.5–8.1)

## 2023-09-25 LAB — CBC
HCT: 31.6 % — ABNORMAL LOW (ref 36.0–46.0)
Hemoglobin: 10.5 g/dL — ABNORMAL LOW (ref 12.0–15.0)
MCH: 29.8 pg (ref 26.0–34.0)
MCHC: 33.2 g/dL (ref 30.0–36.0)
MCV: 89.8 fL (ref 80.0–100.0)
Platelets: 236 K/uL (ref 150–400)
RBC: 3.52 MIL/uL — ABNORMAL LOW (ref 3.87–5.11)
RDW: 11.9 % (ref 11.5–15.5)
WBC: 7.4 K/uL (ref 4.0–10.5)
nRBC: 0 % (ref 0.0–0.2)

## 2023-09-25 LAB — LIPASE, BLOOD: Lipase: 117 U/L — ABNORMAL HIGH (ref 11–51)

## 2023-09-25 MED ORDER — HYDROCODONE-ACETAMINOPHEN 5-325 MG PO TABS
1.0000 | ORAL_TABLET | Freq: Once | ORAL | Status: AC
  Administered 2023-09-25: 1 via ORAL
  Filled 2023-09-25: qty 1

## 2023-09-25 MED ORDER — DOXYCYCLINE HYCLATE 100 MG PO CAPS: 100.0000 mg | ORAL_CAPSULE | Freq: Two times a day (BID) | ORAL | 0 refills | Status: AC

## 2023-09-25 MED ORDER — DOXYCYCLINE HYCLATE 100 MG PO TABS
100.0000 mg | ORAL_TABLET | Freq: Once | ORAL | Status: AC
  Administered 2023-09-25: 100 mg via ORAL
  Filled 2023-09-25: qty 1

## 2023-09-25 NOTE — ED Provider Notes (Signed)
 [facility] Provider Note    Event Date/Time   First MD Initiated Contact with Patient 09/25/23 1207     (approximate)   History   Abdominal Pain   HPI  Karen [name] is a 68 y.o. female with history of hypertension, type 2 diabetes, chronic kidney disease stage III, chronic pancreatitis due to alcoholism, suprapubic catheter secondary to chronic urinary retention and as listed in EMR presents to the emergency department for treatment and evaluation of lower.  Pain started this morning and is similar to previous. She denies fever, nausea, or vomiting.     Physical Exam    Vitals:   09/25/23 1125 09/25/23 1220  BP: (!) 149/75   Pulse: 85   Resp: 16   Temp: 97.9 F (36.6 C)   SpO2: 100% 100%    General: Awake, no distress.  CV:  Good peripheral perfusion.  Resp:  Normal effort.  Abd:  No distention. Tender to palpation over suprapubic area. Small indurated lesion to right of suprapubic catheter without surrounding erythema Other:     ED Results / Procedures / Treatments   Labs (all labs ordered are listed, but only abnormal results are displayed)  Labs Reviewed  LIPASE, BLOOD - Abnormal; Notable for the following components:      Result Value   Lipase 117 (*)    All other components within normal limits  COMPREHENSIVE METABOLIC PANEL WITH GFR - Abnormal; Notable for the following components:   BUN 38 (*)    Creatinine, Ser 2.27 (*)    Calcium  8.6 (*)    AST 76 (*)    Alkaline Phosphatase 270 (*)    GFR, Estimated 23 (*)    All other components within normal limits  CBC - Abnormal; Notable for the following components:   RBC 3.52 (*)    Hemoglobin 10.5 (*)    HCT 31.6 (*)    All other components within normal limits  URINALYSIS, ROUTINE W REFLEX MICROSCOPIC - Abnormal; Notable for the following components:   Color, Urine YELLOW (*)    APPearance CLOUDY (*)    Specific Gravity, Urine 1.003 (*)    Glucose, UA >=500 (*)    Hgb  urine dipstick MODERATE (*)    Protein, ur 100 (*)    Leukocytes,Ua MODERATE (*)    Bacteria, UA MANY (*)    All other components within normal limits  URINALYSIS, ROUTINE W REFLEX MICROSCOPIC - Abnormal; Notable for the following components:   Color, Urine STRAW (*)    APPearance HAZY (*)    Specific Gravity, Urine 1.001 (*)    Glucose, UA >=500 (*)    Hgb urine dipstick MODERATE (*)    Protein, ur 100 (*)    Leukocytes,Ua TRACE (*)    Bacteria, UA MANY (*)    All other components within normal limits  URINE CULTURE     EKG  Not indicated.   RADIOLOGY  Image and radiology report reviewed and interpreted by me. Radiology report consistent with the same.  Not indicated.  PROCEDURES:  Critical Care performed: No  Procedures   MEDICATIONS ORDERED IN ED:  Medications  doxycycline  (VIBRA -TABS) tablet 100 mg (100 mg Oral Given 09/25/23 1434)  HYDROcodone -acetaminophen  (NORCO/VICODIN) 5-325 MG per tablet 1 tablet (1 tablet Oral Given 09/25/23 1435)     IMPRESSION / MDM / ASSESSMENT AND PLAN / ED COURSE   I have reviewed the triage note and vital signs. Vital signs stable   Differential diagnosis  includes, but is not limited to, acute cystitis, urinary retention, catheter malfunction, diverticulitis,  Patient's presentation is most consistent with acute illness / injury with system symptoms.  68 year old female presenting to the emergency department for treatment and evaluation of diffuse abdominal pain that started today.  See HPI for further details.  On exam, she complains of pain with light palpation in suprapubic area.  Suprapubic catheter is in place and draining well.  She has a very small indurated lesion to the right side of the opening of the suprapubic catheter.  Bladder scan obtained with result of 3 mL.  Initial urinalysis was drawn from the bag therefore is not reliable result.  Second urinalysis obtained via the port just below catheter.  Lab studies  reviewed.  CBC is at or near patient's baseline with stable anemia.  BUN, creatinine, and GFR are all at patient's baseline as well.  Alkaline phosphatase is slightly more elevated today at 270 versus 258 about 1 month ago.  Lipase is 117 today in comparison to 76 about 1 month ago.  Outside record and previous ER visit notes reviewed.  Her suprapubic catheter was changed out on 09/16/2023 without any difficulty.  Last ER visit in August she was evaluated here for an abscess in the area of her suprapubic catheter and treated with incision and drainage and oral antibiotics.  The abscess spontaneously drained during the suprapubic catheter changed out on the 11th.  On exam today, she has a very small indurated lesion in that same area that is nonfluctuant.  There is no surrounding erythema to indicate cellulitis.  Repeat urinalysis shows result of many bacteria, 21-50 white blood cells, 21-50 red blood cells with trace leukocytes, moderate hemoglobin, greater than 500 glucose and 100 protein.  Looking back on previous results, the glucose and protein are chronic.  Urine culture results from July indicated E. coli and is susceptible to tetracycline.  Based on patient's allergies and chronic kidney disease antibiotic of choice today will be doxycycline .  Patient is requesting something for pain.  Although listed as allergy, patient is able to take hydrocodone  and it is currently in her medication list.  Single dose given while here.  Outpatient follow-up and ER return precautions discussed.  Patient discharged in stable condition.        FINAL CLINICAL IMPRESSION(S) / ED DIAGNOSES   Final diagnoses:  Acute cystitis with hematuria     Rx / DC Orders   ED Discharge Orders          Ordered    doxycycline  (VIBRAMYCIN ) 100 MG capsule  2 times daily        09/25/23 1439             Note:  This document was prepared using Dragon voice recognition software and may include unintentional  dictation errors.   Herlinda Kirk NOVAK, FNP 09/25/23 1439    Arlander Charleston, MD 09/26/23 [phone]

## 2023-09-25 NOTE — Discharge Instructions (Addendum)
 Follow-up with either primary care or your urologist.  Return to the emergency department for symptoms of change or worsen if you are unable to schedule an appointment.

## 2023-09-25 NOTE — ED Triage Notes (Signed)
 Pt is here due to abdominal pain in lower abdomen.  Pt has foley cath in place and feels like there is blood in catheter bag.  Symptoms began this am.  Pt has had cath in place since 8/22

## 2023-09-27 ENCOUNTER — Telehealth: Payer: Self-pay | Admitting: Family

## 2023-09-27 LAB — URINE CULTURE: Culture: >=100000 — AB

## 2023-09-27 NOTE — Telephone Encounter (Signed)
 Patient left VM stating she went to the ED and she has a bladder infection, they said she had blood in her urine. They gave her doxycycline  and she started it today but it is giving her a headache and nausea. Wants to know if Alan can call her in something different. Please advise.

## 2023-09-28 NOTE — Progress Notes (Signed)
 ED Antimicrobial Stewardship Positive Culture Follow Up   Karen [name] is an 68 y.o. female who presented to [facility] with a chief complaint of  Chief Complaint  Patient presents with   Abdominal Pain    Recent Results (from the past 720 hours)  Urine Culture     Status: Abnormal   Collection Time: 09/25/23 12:37 PM   Specimen: Urine, Catheterized  Result Value Ref Range Status   Specimen Description   Final    URINE, CATHETERIZED Performed at [facility], [address], [place], [place] [address]    Special Requests   Final    NONE Performed at [facility], [address] [place], [place] [address]    Culture (A)  Final    >=100,000 COLONIES/mL ESCHERICHIA COLI Confirmed Extended Spectrum Beta-Lactamase Producer (ESBL).  In bloodstream infections from ESBL organisms, carbapenems are preferred over piperacillin/tazobactam. They are shown to have a lower risk of mortality.    Report Status 09/27/2023 FINAL  Final   Organism ID, Bacteria ESCHERICHIA COLI (A)  Final      Susceptibility   Escherichia coli - MIC*    AMPICILLIN >=32 RESISTANT Resistant     CEFAZOLIN (URINE) Value in next row Resistant      >=32 RESISTANTThis is a modified FDA-approved test that has been validated and its performance characteristics determined by the reporting laboratory.  This laboratory is certified under the Clinical Laboratory Improvement Amendments CLIA as qualified to perform high complexity clinical laboratory testing.    CEFEPIME Value in next row Intermediate      >=32 RESISTANTThis is a modified FDA-approved test that has been validated and its performance characteristics determined by the reporting laboratory.  This laboratory is certified under the Clinical Laboratory Improvement Amendments CLIA as qualified to perform high complexity clinical laboratory testing.    ERTAPENEM Value in next row Sensitive      >=32 RESISTANTThis is a modified FDA-approved test that has  been validated and its performance characteristics determined by the reporting laboratory.  This laboratory is certified under the Clinical Laboratory Improvement Amendments CLIA as qualified to perform high complexity clinical laboratory testing.    CEFTRIAXONE  Value in next row Resistant      >=32 RESISTANTThis is a modified FDA-approved test that has been validated and its performance characteristics determined by the reporting laboratory.  This laboratory is certified under the Clinical Laboratory Improvement Amendments CLIA as qualified to perform high complexity clinical laboratory testing.    CIPROFLOXACIN  Value in next row Resistant      >=32 RESISTANTThis is a modified FDA-approved test that has been validated and its performance characteristics determined by the reporting laboratory.  This laboratory is certified under the Clinical Laboratory Improvement Amendments CLIA as qualified to perform high complexity clinical laboratory testing.    GENTAMICIN Value in next row Sensitive      >=32 RESISTANTThis is a modified FDA-approved test that has been validated and its performance characteristics determined by the reporting laboratory.  This laboratory is certified under the Clinical Laboratory Improvement Amendments CLIA as qualified to perform high complexity clinical laboratory testing.    NITROFURANTOIN  Value in next row Sensitive      >=32 RESISTANTThis is a modified FDA-approved test that has been validated and its performance characteristics determined by the reporting laboratory.  This laboratory is certified under the Clinical Laboratory Improvement Amendments CLIA as qualified to perform high complexity clinical laboratory testing.    TRIMETH /SULFA  Value in next row Resistant      >=  32 RESISTANTThis is a modified FDA-approved test that has been validated and its performance characteristics determined by the reporting laboratory.  This laboratory is certified under the Clinical Laboratory  Improvement Amendments CLIA as qualified to perform high complexity clinical laboratory testing.    AMPICILLIN/SULBACTAM Value in next row Resistant      >=32 RESISTANTThis is a modified FDA-approved test that has been validated and its performance characteristics determined by the reporting laboratory.  This laboratory is certified under the Clinical Laboratory Improvement Amendments CLIA as qualified to perform high complexity clinical laboratory testing.    PIP/TAZO Value in next row Sensitive      <=4 SENSITIVEThis is a modified FDA-approved test that has been validated and its performance characteristics determined by the reporting laboratory.  This laboratory is certified under the Clinical Laboratory Improvement Amendments CLIA as qualified to perform high complexity clinical laboratory testing.    MEROPENEM Value in next row Sensitive      <=4 SENSITIVEThis is a modified FDA-approved test that has been validated and its performance characteristics determined by the reporting laboratory.  This laboratory is certified under the Clinical Laboratory Improvement Amendments CLIA as qualified to perform high complexity clinical laboratory testing.    * >=100,000 COLONIES/mL ESCHERICHIA COLI    Patient discharged on doxycycline , which showed susceptible to previously cultured ESBL E coli. Likely appropriate for treatment. Patient has already reached out to PCP for follow-up as documented in her chart.   [name] 09/28/2023, 12:26 PM Clinical Pharmacist Monday - Friday phone -  [phone] Saturday - Sunday phone - [phone]

## 2023-10-04 ENCOUNTER — Other Ambulatory Visit: Payer: Self-pay | Admitting: Family

## 2023-10-05 ENCOUNTER — Encounter: Payer: Self-pay | Admitting: Cardiology

## 2023-10-05 ENCOUNTER — Ambulatory Visit: Admitting: Cardiology

## 2023-10-05 VITALS — BP 158/86 | HR 87 | Ht 60.0 in | Wt 95.0 lb

## 2023-10-05 DIAGNOSIS — N3001 Acute cystitis with hematuria: Secondary | ICD-10-CM | POA: Diagnosis not present

## 2023-10-05 MED ORDER — NITROFURANTOIN MONOHYD MACRO 100 MG PO CAPS: 100.0000 mg | ORAL_CAPSULE | Freq: Two times a day (BID) | ORAL | 0 refills | Status: AC

## 2023-10-05 NOTE — Progress Notes (Signed)
 Established Patient Office Visit  Subjective:  Patient ID: Karen [name], female    DOB: 1955/09/20  Age: 68 y.o. MRN: [id number]  Chief Complaint  Patient presents with   Hospitalization Follow-up    Hospital Follow Up    Patient in office for hospital follow up. Patient went to the ED on 9/20/258 for lower abdominal pain. Patient has a suprapubic catheter secondary to chronic urinary retention. UA in the ED revealed a UTI, patient prescribed doxycycline . Patient states doxycycline  gave her a headache and nausea so she didn't finish it. Urine culture abnormal, E. Coli. Will send in Macrobid . Follow up as previously scheduled.     No other concerns at this time.   Past Medical History:  Diagnosis Date   Abdominal pain    Acute cystitis 05/18/2020   Acute kidney injury superimposed on CKD    Acute on chronic pancreatitis (HCC) 01/03/2017   Acute renal insufficiency 10/29/2015   Adhesive capsulitis of left shoulder    Alcoholic pancreatitis    Asthma    Depression    Diabetes mellitus without complication (HCC)    Diabetic cataract of right eye (HCC) 08/31/2019   Formatting of this note might be different from the original. Added automatically from request for surgery [id number]   DKA (diabetic ketoacidoses) 02/17/2015   DKA (diabetic ketoacidosis) (HCC) 02/17/2015   IMO SNOMED Dx Update Oct 2024     Elevated alkaline phosphatase level 10/30/2021   Emphysematous cystitis    Esophageal candidiasis (HCC)    ETOH abuse 01/03/2017   Headache 02/26/2019   Hypercholesteremia    Hypertension    Hypertensive urgency 04/21/2016   Hypokalemia    Left flank pain 10/29/2015   Left upper quadrant abdominal pain 05/18/2020   Leukocytosis 06/13/2019   Malignant essential hypertension 10/29/2015   Upper GI bleed 11/11/2017   UTI (urinary tract infection) 01/24/2018    Past Surgical History:  Procedure Laterality Date   ABDOMINAL HYSTERECTOMY  1996   APPENDECTOMY  1997   CYSTOSCOPY  WITH FULGERATION N/A 05/27/2023   Procedure: CYSTOSCOPY, WITH BLADDER FULGURATION and clot evacuation;  Surgeon: Francisca Redell BROCKS, MD;  Location: [facility] ORS;  Service: Urology;  Laterality: N/A;   ESOPHAGOGASTRODUODENOSCOPY N/A 11/12/2017   Procedure: ESOPHAGOGASTRODUODENOSCOPY (EGD);  Surgeon: Unk Corinn Skiff, MD;  Location: [facility] ENDOSCOPY;  Service: Gastroenterology;  Laterality: N/A;   ESOPHAGOGASTRODUODENOSCOPY N/A 09/29/2018   Procedure: ESOPHAGOGASTRODUODENOSCOPY (EGD);  Surgeon: Toledo, Ladell POUR, MD;  Location: [facility] ENDOSCOPY;  Service: Gastroenterology;  Laterality: N/A;   ESOPHAGOGASTRODUODENOSCOPY (EGD) WITH PROPOFOL  N/A 04/24/2016   Procedure: ESOPHAGOGASTRODUODENOSCOPY (EGD) WITH PROPOFOL ;  Surgeon: Rogelia Copping, MD;  Location: [facility] ENDOSCOPY;  Service: Endoscopy;  Laterality: N/A;   ESOPHAGOGASTRODUODENOSCOPY (EGD) WITH PROPOFOL  N/A 01/28/2019   Procedure: ESOPHAGOGASTRODUODENOSCOPY (EGD) WITH PROPOFOL ;  Surgeon: Toledo, Ladell POUR, MD;  Location: [facility] ENDOSCOPY;  Service: Gastroenterology;  Laterality: N/A;   EYE SURGERY     HAND SURGERY  1988   IR CYSTOSTOMY TUBE PLACEMENT/BLADDER ASPIRATION  05/20/2023   THYROID  SURGERY  2013    Social History   Socioeconomic History   Marital status: Significant Other    Spouse name: Not on file   Number of children: 2   Years of education: Not on file   Highest education level: Not on file  Occupational History   Occupation: disabled  Tobacco Use   Smoking status: Former    Types: Cigarettes   Smokeless tobacco: Never   Tobacco comments:    quit 7 years ago  Vaping Use   Vaping status: Never Used  Substance and Sexual Activity   Alcohol use: Not Currently    Alcohol/week: 1.0 standard drink of alcohol    Types: 1 Glasses of wine per week   Drug use: No   Sexual activity: Not Currently  Other Topics Concern   Not on file  Social History Narrative   Lives with Cordella Molt, TERRACE Chyle, great grandkids, & Great/great  grandkids   Social Drivers of Health   Financial Resource Strain: Low Risk  (10/31/2021)   Received from [facility]   Overall Financial Resource Strain (CARDIA)    Difficulty of Paying Living Expenses: Not hard at all  Food Insecurity: No Food Insecurity (05/21/2023)   Hunger Vital Sign    Worried About Running Out of Food in the Last Year: Never true    Ran Out of Food in the Last Year: Never true  Transportation Needs: No Transportation Needs (05/21/2023)   PRAPARE - Administrator, Civil Service (Medical): No    Lack of Transportation (Non-Medical): No  Physical Activity: Not on file  Stress: Not on file  Social Connections: Socially Isolated (05/21/2023)   Social Connection and Isolation Panel    Frequency of Communication with Friends and Family: Three times a week    Frequency of Social Gatherings with Friends and Family: Once a week    Attends Religious Services: Never    Database administrator or Organizations: No    Attends Banker Meetings: Never    Marital Status: Divorced  Catering manager Violence: Not At Risk (05/21/2023)   Humiliation, Afraid, Rape, and Kick questionnaire    Fear of Current or Ex-Partner: No    Emotionally Abused: No    Physically Abused: No    Sexually Abused: No    Family History  Problem Relation Age of Onset   Diabetes Mother     Allergies  Allergen Reactions   Metoclopramide  Other (See Comments) and Shortness Of Breath    Hypotension, shortness of breath  Hypotension, shortness of breath  Hypotension, shortness of breath  Hypotension, shortness of breath    Other Reaction(s): Other (See Comments)    Hypotension, shortness of breath  Hypotension, shortness of breath Hypotension, shortness of breath  Shortness of Breath, Hypotension   Penicillins Anaphylaxis, Rash and Swelling    Has patient had a PCN reaction causing immediate rash, facial/tongue/throat swelling, SOB or lightheadedness with  hypotension: Yes  Has patient had a PCN reaction causing severe rash involving mucus membranes or skin necrosis: No  Has patient had a PCN reaction that required hospitalization No  Has patient had a PCN reaction occurring within the last 10 years: No  If all of the above answers are NO, then may proceed with Cephalosporin use.  Has patient had a PCN reaction causing immediate rash, facial/tongue/throat swelling, SOB or lightheadedness with hypotension: Yes  Has patient had a PCN reaction causing severe rash involving mucus membranes or skin necrosis: No  Has patient had a PCN reaction that required hospitalization No  Has patient had a PCN reaction occurring within the last 10 years: No  If all of the above answers are NO, then may proceed with Cephalosporin use.  Has patient had a PCN reaction causing immediate rash, facial/tongue/throat swelling, SOB or lightheadedness with hypotension: Yes  Has patient had a PCN reaction causing severe rash involving mucus membranes or skin necrosis: No  Has patient had a PCN  reaction that required hospitalization No  Has patient had a PCN reaction occurring within the last 10 years: No  If all of the above answers are NO, then may proceed with Cephalosporin use.  Has patient had a PCN reaction causing immediate rash, facial/tongue/throat swelling, SOB or lightheadedness with hypotension: Yes Has patient had a PCN reaction causing severe rash involving mucus membranes or skin necrosis: No Has patient had a PCN reaction that required hospitalization No Has patient had a PCN reaction occurring within the last 10 years: No If all of the above answers are NO, then may proceed with Cephalosporin use.   Fentanyl  Rash    Rash   Other Rash    Bee sting   Bee sting  Bee sting    Bee sting   Bee sting   Oxycodone -Acetaminophen  Rash    plain tylenol  can also make itch?  plain tylenol  can also make itch?  plain tylenol  can also make itch?  plain  tylenol  can also make itch?    plain tylenol  can also make itch?  plain tylenol  can also make itch? plain tylenol  can also make itch?   Percocet [Oxycodone -Acetaminophen ] Rash    plain tylenol  can also make itch?    Outpatient Medications Prior to Visit  Medication Sig   amLODipine  (NORVASC ) 10 MG tablet Take 1 tablet (10 mg total) by mouth daily. for blood pressure   ARIPiprazole  (ABILIFY ) 5 MG tablet Take 1 tablet (5 mg total) by mouth daily.   cetirizine  (ZYRTEC ) 5 MG tablet Take 1 tablet (5 mg total) by mouth daily.   Continuous Glucose Receiver (DEXCOM G6 RECEIVER) DEVI 1 Device by Does not apply route daily.   Continuous Glucose Sensor (DEXCOM G6 SENSOR) MISC 1 patch by Does not apply route as needed (apply every 10d). Apply one patch every 10 days   Continuous Glucose Transmitter (DEXCOM G6 TRANSMITTER) MISC 1 Device by Does not apply route daily.   cyclobenzaprine  (FLEXERIL ) 5 MG tablet TAKE 1 TABLET BY MOUTH UP TO TWICE A DAYAS NEEDED FOR NECK AND BACK PAIN   diazepam  (VALIUM ) 2 MG tablet Take 1 tablet (2 mg total) by mouth every 12 (twelve) hours as needed for muscle spasms.   docusate sodium  (COLACE) 100 MG capsule TAKE 1 CAPSULE BY MOUTH TWICE DAILY   empagliflozin (JARDIANCE) 25 MG TABS tablet Take 25 mg by mouth daily.   famotidine  (PEPCID ) 20 MG tablet TAKE 1 TABLET BY MOUTH TWICE DAILY   feeding supplement (ENSURE ENLIVE / ENSURE PLUS) LIQD Take 237 mLs by mouth 2 (two) times daily between meals.   [Paused] hydrochlorothiazide  (HYDRODIURIL ) 12.5 MG tablet TAKE 1 TABLET BY MOUTH ONCE DAILY FOR SWELLING IN LEGS   HYDROcodone -acetaminophen  (NORCO/VICODIN) 5-325 MG tablet TAKE 1 OR 2 TABLETS BY MOUTH EVERY 6 HOURS AS NEEDED FOR PAIN (PAIN SCALE 4-6) AS DIRECTED   HYDROcodone -acetaminophen  (NORCO/VICODIN) 5-325 MG tablet TAKE 1 OR 2 TABLETS BY MOUTH EVERY 6 HOURS AS NEEDED FOR PAIN (PAIN SCALE 4-6) AS DIRECTED   hydrOXYzine  (ATARAX ) 25 MG tablet TAKE 1 TABLET BY MOUTH TWICE DAILY  AS NEEDED UNTIL RASH GOES AWAY   insulin  aspart protamine - aspart (NOVOLOG  MIX 70/30 FLEXPEN) (70-30) 100 UNIT/ML FlexPen INJECT 15 UNITS SUBCUTANEOUSLY ONCE EVERY MORNING AS DIRECTED *STOP OTHER INSULINS   Insulin  Pen Needle (BD PEN NEEDLE NANO U/F) 32G X 4 MM MISC Use 4 times a day with insulin  as directed   mirabegron  ER (MYRBETRIQ ) 50 MG TB24 tablet Take 1 tablet (50 mg  total) by mouth daily.   MOUNJARO  5 MG/0.5ML Pen INJECT 5MG  SUBCUTANEOUSLY ONCE A WEEK   NEXLIZET 180-10 MG TABS TAKE 1 TABLET BY MOUTH AT BEDTIME FOR CHOLESTEROL   nortriptyline  (PAMELOR ) 25 MG capsule TAKE 1 CAPSULE BY MOUTH AT BEDTIME   omeprazole  (PRILOSEC) 40 MG capsule TAKE 1 CAPSULE BY MOUTH TWICE DAILY FOR REFULX   Pancrelipase , Lip-Prot-Amyl, (ZENPEP ) 40000-126000 units CPEP TAKE 2 CAPSULES BY MOUTH WITH MEALS AND 1 WITH SNACKS   polyethylene glycol powder (GLYCOLAX /MIRALAX ) 17 GM/SCOOP powder MIX 17 gms (1 CAPFUL) IN 8oz OF WATER OR JUICE AND DRINK ONCE DAILY.   rosuvastatin  (CRESTOR ) 20 MG tablet TAKE 1 TABLET BY MOUTH ONCE EVERY EVENING   VASCEPA  0.5 g CAPS Take 4 capsules by mouth in the morning and at bedtime.   zolpidem  (AMBIEN ) 10 MG tablet TAKE 1 TABLET BY MOUTH AT BEDTIME AS NEEDED FOR INSOMNIA   [DISCONTINUED] promethazine -dextromethorphan  (PROMETHAZINE -DM) 6.25-15 MG/5ML syrup Take 5 mLs by mouth 4 (four) times daily as needed for cough.   No facility-administered medications prior to visit.    Review of Systems  Constitutional: Negative.   HENT: Negative.    Eyes: Negative.   Respiratory: Negative.  Negative for shortness of breath.   Cardiovascular: Negative.  Negative for chest pain.  Gastrointestinal: Negative.  Negative for abdominal pain, constipation and diarrhea.  Genitourinary: Negative.   Musculoskeletal:  Negative for joint pain and myalgias.  Skin: Negative.   Neurological: Negative.  Negative for dizziness and headaches.  Endo/Heme/Allergies: Negative.   All other systems reviewed  and are negative.      Objective:   BP (!) 158/86   Pulse 87   Ht 5' (1.524 m)   Wt 95 lb (43.1 kg)   SpO2 98%   BMI 18.55 kg/m   Vitals:   10/05/23 1101  BP: (!) 158/86  Pulse: 87  Height: 5' (1.524 m)  Weight: 95 lb (43.1 kg)  SpO2: 98%  BMI (Calculated): 18.55    Physical Exam Vitals and nursing note reviewed.  Constitutional:      Appearance: Normal appearance. She is normal weight.  HENT:     Head: Normocephalic and atraumatic.     Nose: Nose normal.     Mouth/Throat:     Mouth: Mucous membranes are moist.  Eyes:     Extraocular Movements: Extraocular movements intact.     Conjunctiva/sclera: Conjunctivae normal.     Pupils: Pupils are equal, round, and reactive to light.  Cardiovascular:     Rate and Rhythm: Normal rate and regular rhythm.     Pulses: Normal pulses.     Heart sounds: Normal heart sounds.  Pulmonary:     Effort: Pulmonary effort is normal.     Breath sounds: Normal breath sounds.  Abdominal:     General: Abdomen is flat. Bowel sounds are normal.     Palpations: Abdomen is soft.  Musculoskeletal:        General: Normal range of motion.     Cervical back: Normal range of motion.  Skin:    General: Skin is warm and dry.  Neurological:     General: No focal deficit present.     Mental Status: She is alert and oriented to person, place, and time.  Psychiatric:        Mood and Affect: Mood normal.        Behavior: Behavior normal.        Thought Content: Thought content normal.  Judgment: Judgment normal.      No results found for any visits on 10/05/23.  Recent Results (from the past 2160 hours)  POCT CBG (Fasting - Glucose)     Status: Abnormal   Collection Time: 07/13/23  9:49 AM  Result Value Ref Range   Glucose Fasting, POC 162 (A) 70 - 99 mg/dL  POC CREATINE & ALBUMIN,URINE     Status: Abnormal   Collection Time: 07/13/23 10:23 AM  Result Value Ref Range   Microalbumin Ur, POC 150 mg/L   Creatinine, POC 50 mg/dL    Albumin/Creatinine Ratio, Urine, POC >300   POCT Urinalysis Dipstick (18997)     Status: Abnormal   Collection Time: 07/13/23 10:24 AM  Result Value Ref Range   Color, UA Yellow    Clarity, UA Clear    Glucose, UA Positive (A) Negative   Bilirubin, UA Negative    Ketones, UA Negative    Spec Grav, UA 1.015 1.010 - 1.025   Blood, UA Trace    pH, UA 5.5 5.0 - 8.0   Protein, UA Positive (A) Negative   Urobilinogen, UA 0.2 0.2 or 1.0 E.U./dL   Nitrite, UA Negative    Leukocytes, UA Negative Negative   Appearance Clear    Odor Yes   Lipid panel     Status: Abnormal   Collection Time: 07/13/23 10:27 AM  Result Value Ref Range   Cholesterol, Total 173 100 - 199 mg/dL   Triglycerides 777 (H) 0 - 149 mg/dL   HDL 65 >60 mg/dL   VLDL Cholesterol Cal 36 5 - 40 mg/dL   LDL Chol Calc (NIH) 72 0 - 99 mg/dL   Chol/HDL Ratio 2.7 0.0 - 4.4 ratio    Comment:                                   T. Chol/HDL Ratio                                             Men  Women                               1/2 Avg.Risk  3.4    3.3                                   Avg.Risk  5.0    4.4                                2X Avg.Risk  9.6    7.1                                3X Avg.Risk 23.4   11.0   VITAMIN D  25 Hydroxy (Vit-D Deficiency, Fractures)     Status: Abnormal   Collection Time: 07/13/23 10:27 AM  Result Value Ref Range   Vit D, 25-Hydroxy 29.8 (L) 30.0 - 100.0 ng/mL    Comment: Vitamin D  deficiency has been defined by the Institute of Medicine and an Endocrine Society practice guideline as a level  of serum 25-OH vitamin D  less than 20 ng/mL (1,2). The Endocrine Society went on to further define vitamin D  insufficiency as a level between 21 and 29 ng/mL (2). 1. IOM (Institute of Medicine). 2010. Dietary reference    intakes for calcium  and D. [place]: The    [employer]. 2. Holick MF, Binkley Toco, Bischoff-Ferrari HA, et al.    Evaluation, treatment, and prevention of vitamin  D    deficiency: an Endocrine Society clinical practice    guideline. JCEM. 2011 Jul; 96(7):1911-30.   CMP14+EGFR     Status: Abnormal   Collection Time: 07/13/23 10:27 AM  Result Value Ref Range   Glucose 116 (H) 70 - 99 mg/dL   BUN 38 (H) 8 - 27 mg/dL   Creatinine, Ser 7.80 (H) 0.57 - 1.00 mg/dL   eGFR 24 (L) >40 fO/fpw/8.26   BUN/Creatinine Ratio 17 12 - 28   Sodium 137 134 - 144 mmol/L   Potassium 4.3 3.5 - 5.2 mmol/L   Chloride 100 96 - 106 mmol/L   CO2 18 (L) 20 - 29 mmol/L   Calcium  9.1 8.7 - 10.3 mg/dL   Total Protein 7.2 6.0 - 8.5 g/dL   Albumin 4.3 3.9 - 4.9 g/dL   Globulin, Total 2.9 1.5 - 4.5 g/dL   Bilirubin Total <9.7 0.0 - 1.2 mg/dL   Alkaline Phosphatase 310 (H) 44 - 121 IU/L   AST 39 0 - 40 IU/L   ALT 24 0 - 32 IU/L  TSH     Status: None   Collection Time: 07/13/23 10:27 AM  Result Value Ref Range   TSH 2.430 0.450 - 4.500 uIU/mL  Hemoglobin A1c     Status: Abnormal   Collection Time: 07/13/23 10:27 AM  Result Value Ref Range   Hgb A1c MFr Bld 5.7 (H) 4.8 - 5.6 %    Comment:          Prediabetes: 5.7 - 6.4          Diabetes: >6.4          Glycemic control for adults with diabetes: <7.0    Est. average glucose Bld gHb Est-mCnc 117 mg/dL  Vitamin B12     Status: None   Collection Time: 07/13/23 10:27 AM  Result Value Ref Range   Vitamin B-12 529 232 - 1,245 pg/mL  CBC with Diff     Status: Abnormal   Collection Time: 07/13/23 10:27 AM  Result Value Ref Range   WBC 7.0 3.4 - 10.8 x10E3/uL   RBC 2.91 (L) 3.77 - 5.28 x10E6/uL   Hemoglobin 9.1 (L) 11.1 - 15.9 g/dL   Hematocrit 71.7 (L) 65.9 - 46.6 %   MCV 97 79 - 97 fL   MCH 31.3 26.6 - 33.0 pg   MCHC 32.3 31.5 - 35.7 g/dL   RDW 86.6 88.2 - 84.5 %   Platelets 269 150 - 450 x10E3/uL   Neutrophils 54 Not Estab. %   Lymphs 35 Not Estab. %   Monocytes 9 Not Estab. %   Eos 1 Not Estab. %   Basos 1 Not Estab. %   Neutrophils Absolute 3.8 1.4 - 7.0 x10E3/uL   Lymphocytes Absolute 2.5 0.7 - 3.1 x10E3/uL    Monocytes Absolute 0.6 0.1 - 0.9 x10E3/uL   EOS (ABSOLUTE) 0.1 0.0 - 0.4 x10E3/uL   Basophils Absolute 0.1 0.0 - 0.2 x10E3/uL   Immature Granulocytes 0 Not Estab. %   Immature Grans (Abs) 0.0 0.0 - 0.1 x10E3/uL  Urinalysis, Routine w reflex microscopic     Status: Abnormal   Collection Time: 07/13/23  3:30 PM  Result Value Ref Range   Specific Gravity, UA 1.013 1.005 - 1.030   pH, UA 6.0 5.0 - 7.5   Color, UA Yellow Yellow   Appearance Ur Clear Clear   Leukocytes,UA Negative Negative   Protein,UA 3+ (A) Negative/Trace   Glucose, UA 2+ (A) Negative   Ketones, UA Negative Negative   RBC, UA Trace (A) Negative   Bilirubin, UA Negative Negative   Urobilinogen, Ur 0.2 0.2 - 1.0 mg/dL   Nitrite, UA Negative Negative   Microscopic Examination See below:     Comment: Microscopic was indicated and was performed.  Microscopic Examination     Status: Abnormal   Collection Time: 07/13/23  3:30 PM  Result Value Ref Range   WBC, UA 0-5 0 - 5 /hpf   RBC, Urine None seen 0 - 2 /hpf   Epithelial Cells (non renal) None seen 0 - 10 /hpf   Casts None seen None seen /lpf   Bacteria, UA None seen None seen/Few   Yeast, UA Present (A) None seen  Urine Culture     Status: Abnormal   Collection Time: 07/13/23  3:31 PM   Specimen: Urine, Clean Catch   UR  Result Value Ref Range   Urine Culture, Routine Final report (A)    Organism ID, Bacteria Escherichia coli (A)     Comment: Susceptibility profile is consistent with a probable ESBL. Multi-Drug Resistant Organism Greater than 100,000 colony forming units per mL    Antimicrobial Susceptibility Comment     Comment:       ** S = Susceptible; I = Intermediate; R = Resistant **                    P = Positive; N = Negative             MICS are expressed in micrograms per mL    Antibiotic                 RSLT#1    RSLT#2    RSLT#3    RSLT#4 Amoxicillin/Clavulanic Acid    I Ampicillin                     R Cefazolin                       R Cefepime                       I Cefoxitin                      S Cefpodoxime                    R Ceftriaxone                     R Ciprofloxacin                   R Ertapenem                      S Gentamicin                     S Levofloxacin   R Meropenem                      S Nitrofurantoin                  S Piperacillin/Tazobactam        S Tetracycline                   S Tobramycin                     S Trimethoprim /Sulfa              R   Lipase, blood     Status: Abnormal   Collection Time: 07/14/23  9:26 AM  Result Value Ref Range   Lipase 64 (H) 11 - 51 U/L    Comment: Performed at [facility], [address] [place], [place] [address]  Comprehensive metabolic panel     Status: Abnormal   Collection Time: 07/14/23  9:26 AM  Result Value Ref Range   Sodium 136 135 - 145 mmol/L   Potassium 4.4 3.5 - 5.1 mmol/L   Chloride 102 98 - 111 mmol/L   CO2 23 22 - 32 mmol/L   Glucose, Bld 103 (H) 70 - 99 mg/dL    Comment: Glucose reference range applies only to samples taken after fasting for at least 8 hours.   BUN 37 (H) 8 - 23 mg/dL   Creatinine, Ser 7.82 (H) 0.44 - 1.00 mg/dL   Calcium  8.9 8.9 - 10.3 mg/dL   Total Protein 7.1 6.5 - 8.1 g/dL   Albumin 3.8 3.5 - 5.0 g/dL   AST 52 (H) 15 - 41 U/L   ALT 27 0 - 44 U/L   Alkaline Phosphatase 226 (H) 38 - 126 U/L   Total Bilirubin 0.7 0.0 - 1.2 mg/dL   GFR, Estimated 25 (L) >60 mL/min    Comment: (NOTE) Calculated using the CKD-EPI Creatinine Equation (2021)    Anion gap 11 5 - 15    Comment: Performed at [facility], [address] [place], [place] [address]  CBC     Status: Abnormal   Collection Time: 07/14/23  9:26 AM  Result Value Ref Range   WBC 6.6 4.0 - 10.5 K/uL   RBC 2.96 (L) 3.87 - 5.11 MIL/uL   Hemoglobin 9.1 (L) 12.0 - 15.0 g/dL   HCT 72.7 (L) 63.9 - 53.9 %   MCV 91.9 80.0 - 100.0 fL   MCH 30.7 26.0 - 34.0 pg   MCHC 33.5 30.0 - 36.0 g/dL   RDW 86.8 88.4 - 84.4 %    Platelets 261 150 - 400 K/uL   nRBC 0.0 0.0 - 0.2 %    Comment: Performed at [facility], [address] [place], [place] [address]  Type and screen [facility]     Status: None   Collection Time: 07/14/23  9:26 AM  Result Value Ref Range   ABO/RH(D) B POS    Antibody Screen POS    Sample Expiration 07/17/2023,2359    Antibody Identification WARM AUTOANTIBODY    DAT, IgG NEG    DAT, complement      NEG Performed at [facility], [address] [place], [place] [address]   Protime-INR     Status: None   Collection Time: 07/14/23  1:16 PM  Result Value Ref Range   Prothrombin Time 13.2 11.4 - 15.2 seconds   INR 0.9  0.8 - 1.2    Comment: (NOTE) INR goal varies based on device and disease states. Performed at [facility], [address] [place], [place] [address]   Resp panel by RT-PCR (RSV, Flu A&B, Covid) Anterior Nasal Swab     Status: None   Collection Time: 08/26/23  6:52 AM   Specimen: Anterior Nasal Swab  Result Value Ref Range   SARS Coronavirus 2 by RT PCR NEGATIVE NEGATIVE    Comment: (NOTE) SARS-CoV-2 target nucleic acids are NOT DETECTED.  The SARS-CoV-2 RNA is generally detectable in upper respiratory specimens during the acute phase of infection. The lowest concentration of SARS-CoV-2 viral copies this assay can detect is 138 copies/mL. A negative result does not preclude SARS-Cov-2 infection and should not be used as the sole basis for treatment or other patient management decisions. A negative result may occur with  improper specimen collection/handling, submission of specimen other than nasopharyngeal swab, presence of viral mutation(s) within the areas targeted by this assay, and inadequate number of viral copies(<138 copies/mL). A negative result must be combined with clinical observations, patient history, and epidemiological information. The expected result is Negative.  Fact Sheet for Patients:   BloggerCourse.com  Fact Sheet for Healthcare Providers:  SeriousBroker.it  This test is no t yet approved or cleared by the [place]  FDA and  has been authorized for detection and/or diagnosis of SARS-CoV-2 by FDA under an Emergency Use Authorization (EUA). This EUA will remain  in effect (meaning this test can be used) for the duration of the COVID-19 declaration under Section 564(b)(1) of the Act, 21 U.S.C.section 360bbb-3(b)(1), unless the authorization is terminated  or revoked sooner.       Influenza A by PCR NEGATIVE NEGATIVE   Influenza B by PCR NEGATIVE NEGATIVE    Comment: (NOTE) The Xpert Xpress SARS-CoV-2/FLU/RSV plus assay is intended as an aid in the diagnosis of influenza from Nasopharyngeal swab specimens and should not be used as a sole basis for treatment. Nasal washings and aspirates are unacceptable for Xpert Xpress SARS-CoV-2/FLU/RSV testing.  Fact Sheet for Patients: BloggerCourse.com  Fact Sheet for Healthcare Providers: SeriousBroker.it  This test is not yet approved or cleared by the [place]  FDA and has been authorized for detection and/or diagnosis of SARS-CoV-2 by FDA under an Emergency Use Authorization (EUA). This EUA will remain in effect (meaning this test can be used) for the duration of the COVID-19 declaration under Section 564(b)(1) of the Act, 21 U.S.C. section 360bbb-3(b)(1), unless the authorization is terminated or revoked.     Resp Syncytial Virus by PCR NEGATIVE NEGATIVE    Comment: (NOTE) Fact Sheet for Patients: BloggerCourse.com  Fact Sheet for Healthcare Providers: SeriousBroker.it  This test is not yet approved or cleared by the [place]  FDA and has been authorized for detection and/or diagnosis of SARS-CoV-2 by FDA under an Emergency Use Authorization (EUA).  This EUA will remain in effect (meaning this test can be used) for the duration of the COVID-19 declaration under Section 564(b)(1) of the Act, 21 U.S.C. section 360bbb-3(b)(1), unless the authorization is terminated or revoked.  Performed at [facility], [address] [place], [place] [address]   Urinalysis, Routine w reflex microscopic -Urine, Suprapubic     Status: Abnormal   Collection Time: 08/28/23  3:21 PM  Result Value Ref Range   Color, Urine STRAW (A) YELLOW   APPearance HAZY (A) CLEAR   Specific Gravity, Urine 1.003 (L) 1.005 - 1.030   pH 5.0 5.0 -  8.0   Glucose, UA >=500 (A) NEGATIVE mg/dL   Hgb urine dipstick NEGATIVE NEGATIVE   Bilirubin Urine NEGATIVE NEGATIVE   Ketones, ur NEGATIVE NEGATIVE mg/dL   Protein, ur 899 (A) NEGATIVE mg/dL   Nitrite NEGATIVE NEGATIVE   Leukocytes,Ua TRACE (A) NEGATIVE   RBC / HPF 0-5 0 - 5 RBC/hpf   WBC, UA 11-20 0 - 5 WBC/hpf   Bacteria, UA MANY (A) NONE SEEN   Squamous Epithelial / HPF 0 0 - 5 /HPF   Mucus PRESENT    Amorphous Crystal PRESENT     Comment: Performed at [facility], [address] [place], [place] [address]  Lipase, blood     Status: Abnormal   Collection Time: 08/28/23  3:22 PM  Result Value Ref Range   Lipase 76 (H) 11 - 51 U/L    Comment: Performed at [facility], [address] [place], [place] [address]  Comprehensive metabolic panel     Status: Abnormal   Collection Time: 08/28/23  3:22 PM  Result Value Ref Range   Sodium 139 135 - 145 mmol/L   Potassium 3.4 (L) 3.5 - 5.1 mmol/L   Chloride 101 98 - 111 mmol/L   CO2 22 22 - 32 mmol/L   Glucose, Bld 143 (H) 70 - 99 mg/dL    Comment: Glucose reference range applies only to samples taken after fasting for at least 8 hours.   BUN 23 8 - 23 mg/dL   Creatinine, Ser 7.75 (H) 0.44 - 1.00 mg/dL   Calcium  9.4 8.9 - 10.3 mg/dL   Total Protein 7.7 6.5 - 8.1 g/dL   Albumin 4.0 3.5 - 5.0 g/dL   AST 34 15 - 41 U/L   ALT 25 0 - 44  U/L   Alkaline Phosphatase 258 (H) 38 - 126 U/L   Total Bilirubin 0.3 0.0 - 1.2 mg/dL   GFR, Estimated 24 (L) >60 mL/min    Comment: (NOTE) Calculated using the CKD-EPI Creatinine Equation (2021)    Anion gap 16 (H) 5 - 15    Comment: Performed at [facility], [address] [place], [place] [address]  CBC     Status: Abnormal   Collection Time: 08/28/23  3:22 PM  Result Value Ref Range   WBC 9.4 4.0 - 10.5 K/uL   RBC 3.70 (L) 3.87 - 5.11 MIL/uL   Hemoglobin 11.4 (L) 12.0 - 15.0 g/dL   HCT 67.1 (L) 63.9 - 53.9 %   MCV 88.6 80.0 - 100.0 fL   MCH 30.8 26.0 - 34.0 pg   MCHC 34.8 30.0 - 36.0 g/dL   RDW 88.0 88.4 - 84.4 %   Platelets 266 150 - 400 K/uL   nRBC 0.0 0.0 - 0.2 %    Comment: Performed at [facility], [address] [place], [place] [address]  Lipase, blood     Status: Abnormal   Collection Time: 09/25/23 11:28 AM  Result Value Ref Range   Lipase 117 (H) 11 - 51 U/L    Comment: Performed at [facility], [address] [place], [place] [address]  Comprehensive metabolic panel     Status: Abnormal   Collection Time: 09/25/23 11:28 AM  Result Value Ref Range   Sodium 137 135 - 145 mmol/L   Potassium 3.9 3.5 - 5.1 mmol/L   Chloride 99 98 - 111 mmol/L   CO2 24 22 - 32 mmol/L   Glucose, Bld 93 70 - 99 mg/dL  Comment: Glucose reference range applies only to samples taken after fasting for at least 8 hours.   BUN 38 (H) 8 - 23 mg/dL   Creatinine, Ser 7.72 (H) 0.44 - 1.00 mg/dL   Calcium  8.6 (L) 8.9 - 10.3 mg/dL   Total Protein 7.2 6.5 - 8.1 g/dL   Albumin 3.6 3.5 - 5.0 g/dL   AST 76 (H) 15 - 41 U/L   ALT 37 0 - 44 U/L   Alkaline Phosphatase 270 (H) 38 - 126 U/L   Total Bilirubin 0.5 0.0 - 1.2 mg/dL   GFR, Estimated 23 (L) >60 mL/min    Comment: (NOTE) Calculated using the CKD-EPI Creatinine Equation (2021)    Anion gap 14 5 - 15    Comment: Performed at [facility], [address] [place], [place] [address]  CBC      Status: Abnormal   Collection Time: 09/25/23 11:28 AM  Result Value Ref Range   WBC 7.4 4.0 - 10.5 K/uL   RBC 3.52 (L) 3.87 - 5.11 MIL/uL   Hemoglobin 10.5 (L) 12.0 - 15.0 g/dL   HCT 68.3 (L) 63.9 - 53.9 %   MCV 89.8 80.0 - 100.0 fL   MCH 29.8 26.0 - 34.0 pg   MCHC 33.2 30.0 - 36.0 g/dL   RDW 88.0 88.4 - 84.4 %   Platelets 236 150 - 400 K/uL   nRBC 0.0 0.0 - 0.2 %    Comment: Performed at [facility], [address] [place], [place] [address]  Urinalysis, Routine w reflex microscopic -Urine, Catheterized     Status: Abnormal   Collection Time: 09/25/23 11:28 AM  Result Value Ref Range   Color, Urine YELLOW (A) YELLOW   APPearance CLOUDY (A) CLEAR   Specific Gravity, Urine 1.003 (L) 1.005 - 1.030   pH 5.0 5.0 - 8.0   Glucose, UA >=500 (A) NEGATIVE mg/dL   Hgb urine dipstick MODERATE (A) NEGATIVE   Bilirubin Urine NEGATIVE NEGATIVE   Ketones, ur NEGATIVE NEGATIVE mg/dL   Protein, ur 899 (A) NEGATIVE mg/dL   Nitrite NEGATIVE NEGATIVE   Leukocytes,Ua MODERATE (A) NEGATIVE   RBC / HPF 6-10 0 - 5 RBC/hpf   WBC, UA 21-50 0 - 5 WBC/hpf   Bacteria, UA MANY (A) NONE SEEN   Squamous Epithelial / HPF 0-5 0 - 5 /HPF   Mucus PRESENT    Budding Yeast PRESENT     Comment: Performed at [facility], [address] [place] [address] [place], [place] [address]  Urinalysis, Routine w reflex microscopic -Urine, Catheterized     Status: Abnormal   Collection Time: 09/25/23 12:37 PM  Result Value Ref Range   Color, Urine STRAW (A) YELLOW   APPearance HAZY (A) CLEAR   Specific Gravity, Urine 1.001 (L) 1.005 - 1.030   pH 6.0 5.0 - 8.0   Glucose, UA >=500 (A) NEGATIVE mg/dL   Hgb urine dipstick MODERATE (A) NEGATIVE   Bilirubin Urine NEGATIVE NEGATIVE   Ketones, ur NEGATIVE NEGATIVE mg/dL   Protein, ur 899 (A) NEGATIVE mg/dL   Nitrite NEGATIVE NEGATIVE   Leukocytes,Ua TRACE (A) NEGATIVE   RBC / HPF 21-50 0 - 5 RBC/hpf   WBC, UA 21-50 0 - 5 WBC/hpf   Bacteria, UA MANY (A) NONE SEEN    Squamous Epithelial / HPF 0 0 - 5 /HPF   Budding Yeast PRESENT     Comment: Performed at [facility], [address], [place], [place] [address]  Urine Culture  Status: Abnormal   Collection Time: 09/25/23 12:37 PM   Specimen: Urine, Catheterized  Result Value Ref Range   Specimen Description      URINE, CATHETERIZED Performed at [facility], [address], [place], [place] [address]    Special Requests      NONE Performed at [facility], [address] [place], [place] [address]    Culture (A)     >=100,000 COLONIES/mL ESCHERICHIA COLI Confirmed Extended Spectrum Beta-Lactamase Producer (ESBL).  In bloodstream infections from ESBL organisms, carbapenems are preferred over piperacillin/tazobactam. They are shown to have a lower risk of mortality.    Report Status 09/27/2023 FINAL    Organism ID, Bacteria ESCHERICHIA COLI (A)       Susceptibility   Escherichia coli - MIC*    AMPICILLIN >=32 RESISTANT Resistant     CEFAZOLIN (URINE) Value in next row Resistant      >=32 RESISTANTThis is a modified FDA-approved test that has been validated and its performance characteristics determined by the reporting laboratory.  This laboratory is certified under the Clinical Laboratory Improvement Amendments CLIA as qualified to perform high complexity clinical laboratory testing.    CEFEPIME Value in next row Intermediate      >=32 RESISTANTThis is a modified FDA-approved test that has been validated and its performance characteristics determined by the reporting laboratory.  This laboratory is certified under the Clinical Laboratory Improvement Amendments CLIA as qualified to perform high complexity clinical laboratory testing.    ERTAPENEM Value in next row Sensitive      >=32 RESISTANTThis is a modified FDA-approved test that has been validated and its performance characteristics determined by the reporting laboratory.  This laboratory is certified under the  Clinical Laboratory Improvement Amendments CLIA as qualified to perform high complexity clinical laboratory testing.    CEFTRIAXONE  Value in next row Resistant      >=32 RESISTANTThis is a modified FDA-approved test that has been validated and its performance characteristics determined by the reporting laboratory.  This laboratory is certified under the Clinical Laboratory Improvement Amendments CLIA as qualified to perform high complexity clinical laboratory testing.    CIPROFLOXACIN  Value in next row Resistant      >=32 RESISTANTThis is a modified FDA-approved test that has been validated and its performance characteristics determined by the reporting laboratory.  This laboratory is certified under the Clinical Laboratory Improvement Amendments CLIA as qualified to perform high complexity clinical laboratory testing.    GENTAMICIN Value in next row Sensitive      >=32 RESISTANTThis is a modified FDA-approved test that has been validated and its performance characteristics determined by the reporting laboratory.  This laboratory is certified under the Clinical Laboratory Improvement Amendments CLIA as qualified to perform high complexity clinical laboratory testing.    NITROFURANTOIN  Value in next row Sensitive      >=32 RESISTANTThis is a modified FDA-approved test that has been validated and its performance characteristics determined by the reporting laboratory.  This laboratory is certified under the Clinical Laboratory Improvement Amendments CLIA as qualified to perform high complexity clinical laboratory testing.    TRIMETH /SULFA  Value in next row Resistant      >=32 RESISTANTThis is a modified FDA-approved test that has been validated and its performance characteristics determined by the reporting laboratory.  This laboratory is certified under the Clinical Laboratory Improvement Amendments CLIA as qualified to perform high complexity clinical laboratory testing.    AMPICILLIN/SULBACTAM Value in  next row Resistant      >=  32 RESISTANTThis is a modified FDA-approved test that has been validated and its performance characteristics determined by the reporting laboratory.  This laboratory is certified under the Clinical Laboratory Improvement Amendments CLIA as qualified to perform high complexity clinical laboratory testing.    PIP/TAZO Value in next row Sensitive      <=4 SENSITIVEThis is a modified FDA-approved test that has been validated and its performance characteristics determined by the reporting laboratory.  This laboratory is certified under the Clinical Laboratory Improvement Amendments CLIA as qualified to perform high complexity clinical laboratory testing.    MEROPENEM Value in next row Sensitive      <=4 SENSITIVEThis is a modified FDA-approved test that has been validated and its performance characteristics determined by the reporting laboratory.  This laboratory is certified under the Clinical Laboratory Improvement Amendments CLIA as qualified to perform high complexity clinical laboratory testing.    * >=100,000 COLONIES/mL ESCHERICHIA COLI      Assessment & Plan:  Macrobid  Follow up as previously scheduled  Problem List Items Addressed This Visit       Genitourinary   Acute cystitis - Primary    Return if symptoms worsen or fail to improve, for keep 10/8 ov with Alan.   Total time spent: 25 minutes  [employer], NP  10/05/2023   This document may have been prepared by Dragon Voice Recognition software and as such may include unintentional dictation errors.

## 2023-10-08 ENCOUNTER — Telehealth: Payer: Self-pay | Admitting: Family

## 2023-10-08 NOTE — Telephone Encounter (Signed)
 Pt called stating she needs a refill on the HYDROCODONE  & to have it sent to [facility] in [place]

## 2023-10-11 ENCOUNTER — Telehealth: Payer: Self-pay | Admitting: Family

## 2023-10-11 NOTE — Telephone Encounter (Signed)
Patient left VM requesting a call back. Did not state what she needs.

## 2023-10-11 NOTE — Telephone Encounter (Signed)
 Pt seen for ED follow up last week , will see Alan this week for appt

## 2023-10-12 NOTE — Telephone Encounter (Signed)
 Bringing paperwork to next appointment.

## 2023-10-13 ENCOUNTER — Ambulatory Visit: Admitting: Family

## 2023-10-15 ENCOUNTER — Other Ambulatory Visit: Payer: Self-pay | Admitting: Family

## 2023-10-18 ENCOUNTER — Other Ambulatory Visit: Payer: Self-pay | Admitting: Family

## 2023-10-18 ENCOUNTER — Ambulatory Visit: Admitting: Physician Assistant

## 2023-10-19 ENCOUNTER — Ambulatory Visit: Admitting: Physician Assistant

## 2023-10-19 ENCOUNTER — Ambulatory Visit: Admitting: Family

## 2023-10-19 ENCOUNTER — Encounter: Payer: Self-pay | Admitting: Family

## 2023-10-19 VITALS — BP 120/50 | HR 81 | Ht 60.0 in | Wt 95.6 lb

## 2023-10-19 DIAGNOSIS — N1832 Chronic kidney disease, stage 3b: Secondary | ICD-10-CM

## 2023-10-19 DIAGNOSIS — E559 Vitamin D deficiency, unspecified: Secondary | ICD-10-CM

## 2023-10-19 DIAGNOSIS — I10 Essential (primary) hypertension: Secondary | ICD-10-CM | POA: Diagnosis not present

## 2023-10-19 DIAGNOSIS — E1169 Type 2 diabetes mellitus with other specified complication: Secondary | ICD-10-CM | POA: Diagnosis not present

## 2023-10-19 DIAGNOSIS — E538 Deficiency of other specified B group vitamins: Secondary | ICD-10-CM

## 2023-10-19 DIAGNOSIS — R109 Unspecified abdominal pain: Secondary | ICD-10-CM

## 2023-10-19 DIAGNOSIS — G8929 Other chronic pain: Secondary | ICD-10-CM

## 2023-10-19 DIAGNOSIS — K860 Alcohol-induced chronic pancreatitis: Secondary | ICD-10-CM

## 2023-10-19 DIAGNOSIS — R5383 Other fatigue: Secondary | ICD-10-CM

## 2023-10-19 DIAGNOSIS — E785 Hyperlipidemia, unspecified: Secondary | ICD-10-CM

## 2023-10-19 DIAGNOSIS — E782 Mixed hyperlipidemia: Secondary | ICD-10-CM

## 2023-10-19 DIAGNOSIS — K219 Gastro-esophageal reflux disease without esophagitis: Secondary | ICD-10-CM

## 2023-10-19 MED ORDER — HYDROCODONE-ACETAMINOPHEN 5-325 MG PO TABS: ORAL_TABLET | ORAL | 0 refills | Status: DC

## 2023-10-21 ENCOUNTER — Ambulatory Visit: Admitting: Physician Assistant

## 2023-10-21 VITALS — BP 90/58 | HR 89 | Ht 61.0 in | Wt 95.0 lb

## 2023-10-21 DIAGNOSIS — Z435 Encounter for attention to cystostomy: Secondary | ICD-10-CM | POA: Diagnosis not present

## 2023-10-21 DIAGNOSIS — L02211 Cutaneous abscess of abdominal wall: Secondary | ICD-10-CM

## 2023-10-21 NOTE — Progress Notes (Signed)
 Suprapubic Cath Change  Patient is present today for a suprapubic catheter change due to urinary retention.  8ml of water was drained from the balloon, a 16FR Silastic foley cath was removed from the tract without difficulty.  Site was cleaned and prepped in a sterile fashion with betadine.  A 16FR foley cath was replaced into the tract no complications were noted. Urine return was noted, 10 ml of sterile water was inflated into the balloon and a leg bag was attached for drainage.  Patient tolerated well.   Performed by: Zala Degrasse, PA-C  Additional notes: The small, superficial abscess at the right lateral margin of her SP tract has refilled since our last visit. She has been using topical mupirocin  daily. No surrounding erythema concerning for cellulitis. I prepped it with an alcohol swab and pierced it with an 18G needle today with drainage of scant blood. Presentation today more similar to hemorrhagic cyst versus granulation tissue than abscess.  Clotilda Cornwall and I elected to treat the top of the area with silver nitrate to reduce it in size and attempt to prevent reaccumulation of fluid. Patient tolerated well. I counseled her to continue mupirocin  twice daily and cover the area with gauze to collect any drainage.  Follow up: Return in about 4 weeks (around 11/18/2023) for SPT exchange.

## 2023-10-23 ENCOUNTER — Encounter: Payer: Self-pay | Admitting: Family

## 2023-10-23 NOTE — Assessment & Plan Note (Signed)
 Blood pressure well controlled with current medications.  Continue current therapy.  Will reassess at follow up.

## 2023-10-23 NOTE — Assessment & Plan Note (Signed)
 Continue current diabetes POC, as patient has been well controlled on current regimen.  Will adjust meds if needed based on labs.

## 2023-10-23 NOTE — Assessment & Plan Note (Signed)
 Continue current therapy for lipid control. Will modify as needed based on labwork results.

## 2023-10-23 NOTE — Assessment & Plan Note (Signed)
 Patient stable.  Well controlled with current therapy.   Continue current meds.

## 2023-10-23 NOTE — Progress Notes (Signed)
 Established Patient Office Visit  Subjective:  Patient ID: Karen [name], female    DOB: 06-19-1955  Age: 68 y.o. MRN: [id number]  Chief Complaint  Patient presents with   Follow-up    3 month follow up    Patient is here today for her 3 months follow up.  She has been feeling fairly well since last appointment.   She does have additional concerns to discuss today.  She needs refills for her pain medication, as she is still having significant pain from her chronic pancreatitis and her low back pain.  She has been on this dose of pain medication for quite some time, needs this due to the severe pain her chronic pancreatitis causes her.  She is willing to be set up with referral to pain clinic for management of her medications.   Labs are not due today.  She needs refills.   I have reviewed her active problem list, medication list, allergies, notes from last encounter, lab results for her appointment today.      No other concerns at this time.   Past Medical History:  Diagnosis Date   Abdominal pain    Acute cystitis 05/18/2020   Acute kidney injury superimposed on CKD    Acute on chronic pancreatitis (HCC) 01/03/2017   Acute renal insufficiency 10/29/2015   Adhesive capsulitis of left shoulder    Alcoholic pancreatitis    Asthma    Depression    Diabetes mellitus without complication (HCC)    Diabetic cataract of right eye (HCC) 08/31/2019   Formatting of this note might be different from the original. Added automatically from request for surgery [id number]   DKA (diabetic ketoacidoses) 02/17/2015   DKA (diabetic ketoacidosis) (HCC) 02/17/2015   IMO SNOMED Dx Update Oct 2024     Elevated alkaline phosphatase level 10/30/2021   Emphysematous cystitis    Esophageal candidiasis (HCC)    ETOH abuse 01/03/2017   Headache 02/26/2019   Hypercholesteremia    Hypertension    Hypertensive urgency 04/21/2016   Hypokalemia    Left flank pain 10/29/2015   Left upper quadrant  abdominal pain 05/18/2020   Leukocytosis 06/13/2019   Malignant essential hypertension 10/29/2015   Upper GI bleed 11/11/2017   UTI (urinary tract infection) 01/24/2018    Past Surgical History:  Procedure Laterality Date   ABDOMINAL HYSTERECTOMY  1996   APPENDECTOMY  1997   CYSTOSCOPY WITH FULGERATION N/A 05/27/2023   Procedure: CYSTOSCOPY, WITH BLADDER FULGURATION and clot evacuation;  Surgeon: Francisca Redell BROCKS, MD;  Location: [facility] ORS;  Service: Urology;  Laterality: N/A;   ESOPHAGOGASTRODUODENOSCOPY N/A 11/12/2017   Procedure: ESOPHAGOGASTRODUODENOSCOPY (EGD);  Surgeon: Unk Corinn Skiff, MD;  Location: [facility] ENDOSCOPY;  Service: Gastroenterology;  Laterality: N/A;   ESOPHAGOGASTRODUODENOSCOPY N/A 09/29/2018   Procedure: ESOPHAGOGASTRODUODENOSCOPY (EGD);  Surgeon: Toledo, Ladell POUR, MD;  Location: [facility] ENDOSCOPY;  Service: Gastroenterology;  Laterality: N/A;   ESOPHAGOGASTRODUODENOSCOPY (EGD) WITH PROPOFOL  N/A 04/24/2016   Procedure: ESOPHAGOGASTRODUODENOSCOPY (EGD) WITH PROPOFOL ;  Surgeon: Rogelia Copping, MD;  Location: [facility] ENDOSCOPY;  Service: Endoscopy;  Laterality: N/A;   ESOPHAGOGASTRODUODENOSCOPY (EGD) WITH PROPOFOL  N/A 01/28/2019   Procedure: ESOPHAGOGASTRODUODENOSCOPY (EGD) WITH PROPOFOL ;  Surgeon: Toledo, Ladell POUR, MD;  Location: [facility] ENDOSCOPY;  Service: Gastroenterology;  Laterality: N/A;   EYE SURGERY     HAND SURGERY  1988   IR CYSTOSTOMY TUBE PLACEMENT/BLADDER ASPIRATION  05/20/2023   THYROID  SURGERY  2013    Social History   Socioeconomic History   Marital status: Significant Other  Spouse name: Not on file   Number of children: 2   Years of education: Not on file   Highest education level: Not on file  Occupational History   Occupation: disabled  Tobacco Use   Smoking status: Former    Types: Cigarettes   Smokeless tobacco: Never   Tobacco comments:    quit 7 years ago   Vaping Use   Vaping status: Never Used  Substance and Sexual Activity   Alcohol use:  Not Currently    Alcohol/week: 1.0 standard drink of alcohol    Types: 1 Glasses of wine per week   Drug use: No   Sexual activity: Not Currently  Other Topics Concern   Not on file  Social History Narrative   Lives with Cordella Molt, TERRACE Chyle, great grandkids, & Great/great grandkids   Social Drivers of Health   Financial Resource Strain: Low Risk  (10/31/2021)   Received from [facility]   Overall Financial Resource Strain (CARDIA)    Difficulty of Paying Living Expenses: Not hard at all  Food Insecurity: No Food Insecurity (05/21/2023)   Hunger Vital Sign    Worried About Running Out of Food in the Last Year: Never true    Ran Out of Food in the Last Year: Never true  Transportation Needs: No Transportation Needs (05/21/2023)   PRAPARE - Administrator, Civil Service (Medical): No    Lack of Transportation (Non-Medical): No  Physical Activity: Not on file  Stress: Not on file  Social Connections: Socially Isolated (05/21/2023)   Social Connection and Isolation Panel    Frequency of Communication with Friends and Family: Three times a week    Frequency of Social Gatherings with Friends and Family: Once a week    Attends Religious Services: Never    Database administrator or Organizations: No    Attends Banker Meetings: Never    Marital Status: Divorced  Catering manager Violence: Not At Risk (05/21/2023)   Humiliation, Afraid, Rape, and Kick questionnaire    Fear of Current or Ex-Partner: No    Emotionally Abused: No    Physically Abused: No    Sexually Abused: No    Family History  Problem Relation Age of Onset   Diabetes Mother     Allergies  Allergen Reactions   Metoclopramide  Other (See Comments) and Shortness Of Breath    Hypotension, shortness of breath  Hypotension, shortness of breath  Hypotension, shortness of breath  Hypotension, shortness of breath    Other Reaction(s): Other (See Comments)    Hypotension,  shortness of breath  Hypotension, shortness of breath Hypotension, shortness of breath  Shortness of Breath, Hypotension   Penicillins Anaphylaxis, Rash and Swelling    Has patient had a PCN reaction causing immediate rash, facial/tongue/throat swelling, SOB or lightheadedness with hypotension: Yes  Has patient had a PCN reaction causing severe rash involving mucus membranes or skin necrosis: No  Has patient had a PCN reaction that required hospitalization No  Has patient had a PCN reaction occurring within the last 10 years: No  If all of the above answers are NO, then may proceed with Cephalosporin use.  Has patient had a PCN reaction causing immediate rash, facial/tongue/throat swelling, SOB or lightheadedness with hypotension: Yes  Has patient had a PCN reaction causing severe rash involving mucus membranes or skin necrosis: No  Has patient had a PCN reaction that required hospitalization No  Has patient had a  PCN reaction occurring within the last 10 years: No  If all of the above answers are NO, then may proceed with Cephalosporin use.  Has patient had a PCN reaction causing immediate rash, facial/tongue/throat swelling, SOB or lightheadedness with hypotension: Yes  Has patient had a PCN reaction causing severe rash involving mucus membranes or skin necrosis: No  Has patient had a PCN reaction that required hospitalization No  Has patient had a PCN reaction occurring within the last 10 years: No  If all of the above answers are NO, then may proceed with Cephalosporin use.  Has patient had a PCN reaction causing immediate rash, facial/tongue/throat swelling, SOB or lightheadedness with hypotension: Yes Has patient had a PCN reaction causing severe rash involving mucus membranes or skin necrosis: No Has patient had a PCN reaction that required hospitalization No Has patient had a PCN reaction occurring within the last 10 years: No If all of the above answers are NO, then may proceed  with Cephalosporin use.   Fentanyl  Rash    Rash   Other Rash    Bee sting   Bee sting  Bee sting    Bee sting   Bee sting   Oxycodone -Acetaminophen  Rash    plain tylenol  can also make itch?  plain tylenol  can also make itch?  plain tylenol  can also make itch?  plain tylenol  can also make itch?    plain tylenol  can also make itch?  plain tylenol  can also make itch? plain tylenol  can also make itch?   Percocet [Oxycodone -Acetaminophen ] Rash    plain tylenol  can also make itch?    Review of Systems  Gastrointestinal:  Positive for abdominal pain.  Musculoskeletal:  Positive for back pain and joint pain.  All other systems reviewed and are negative.      Objective:   BP (!) 120/50   Pulse 81   Ht 5' (1.524 m)   Wt 95 lb 9.6 oz (43.4 kg)   SpO2 98%   BMI 18.67 kg/m   Vitals:   10/19/23 1337  BP: (!) 120/50  Pulse: 81  Height: 5' (1.524 m)  Weight: 95 lb 9.6 oz (43.4 kg)  SpO2: 98%  BMI (Calculated): 18.67    Physical Exam Vitals and nursing note reviewed.  Constitutional:      Appearance: Normal appearance. She is normal weight.  HENT:     Head: Normocephalic.  Eyes:     Extraocular Movements: Extraocular movements intact.     Conjunctiva/sclera: Conjunctivae normal.     Pupils: Pupils are equal, round, and reactive to light.  Cardiovascular:     Rate and Rhythm: Normal rate.  Pulmonary:     Effort: Pulmonary effort is normal.  Neurological:     General: No focal deficit present.     Mental Status: She is alert and oriented to person, place, and time. Mental status is at baseline.  Psychiatric:        Mood and Affect: Mood normal.        Behavior: Behavior normal.        Thought Content: Thought content normal.      No results found for any visits on 10/19/23.  Recent Results (from the past 2160 hours)  Resp panel by RT-PCR (RSV, Flu A&B, Covid) Anterior Nasal Swab     Status: None   Collection Time: 08/26/23  6:52 AM   Specimen: Anterior  Nasal Swab  Result Value Ref Range   SARS Coronavirus 2 by RT PCR NEGATIVE NEGATIVE  Comment: (NOTE) SARS-CoV-2 target nucleic acids are NOT DETECTED.  The SARS-CoV-2 RNA is generally detectable in upper respiratory specimens during the acute phase of infection. The lowest concentration of SARS-CoV-2 viral copies this assay can detect is 138 copies/mL. A negative result does not preclude SARS-Cov-2 infection and should not be used as the sole basis for treatment or other patient management decisions. A negative result may occur with  improper specimen collection/handling, submission of specimen other than nasopharyngeal swab, presence of viral mutation(s) within the areas targeted by this assay, and inadequate number of viral copies(<138 copies/mL). A negative result must be combined with clinical observations, patient history, and epidemiological information. The expected result is Negative.  Fact Sheet for Patients:  BloggerCourse.com  Fact Sheet for Healthcare Providers:  SeriousBroker.it  This test is no t yet approved or cleared by the [place]  FDA and  has been authorized for detection and/or diagnosis of SARS-CoV-2 by FDA under an Emergency Use Authorization (EUA). This EUA will remain  in effect (meaning this test can be used) for the duration of the COVID-19 declaration under Section 564(b)(1) of the Act, 21 U.S.C.section 360bbb-3(b)(1), unless the authorization is terminated  or revoked sooner.       Influenza A by PCR NEGATIVE NEGATIVE   Influenza B by PCR NEGATIVE NEGATIVE    Comment: (NOTE) The Xpert Xpress SARS-CoV-2/FLU/RSV plus assay is intended as an aid in the diagnosis of influenza from Nasopharyngeal swab specimens and should not be used as a sole basis for treatment. Nasal washings and aspirates are unacceptable for Xpert Xpress SARS-CoV-2/FLU/RSV testing.  Fact Sheet for  Patients: BloggerCourse.com  Fact Sheet for Healthcare Providers: SeriousBroker.it  This test is not yet approved or cleared by the [place]  FDA and has been authorized for detection and/or diagnosis of SARS-CoV-2 by FDA under an Emergency Use Authorization (EUA). This EUA will remain in effect (meaning this test can be used) for the duration of the COVID-19 declaration under Section 564(b)(1) of the Act, 21 U.S.C. section 360bbb-3(b)(1), unless the authorization is terminated or revoked.     Resp Syncytial Virus by PCR NEGATIVE NEGATIVE    Comment: (NOTE) Fact Sheet for Patients: BloggerCourse.com  Fact Sheet for Healthcare Providers: SeriousBroker.it  This test is not yet approved or cleared by the [place]  FDA and has been authorized for detection and/or diagnosis of SARS-CoV-2 by FDA under an Emergency Use Authorization (EUA). This EUA will remain in effect (meaning this test can be used) for the duration of the COVID-19 declaration under Section 564(b)(1) of the Act, 21 U.S.C. section 360bbb-3(b)(1), unless the authorization is terminated or revoked.  Performed at [facility], [address] [place], [place] [address]   Urinalysis, Routine w reflex microscopic -Urine, Suprapubic     Status: Abnormal   Collection Time: 08/28/23  3:21 PM  Result Value Ref Range   Color, Urine STRAW (A) YELLOW   APPearance HAZY (A) CLEAR   Specific Gravity, Urine 1.003 (L) 1.005 - 1.030   pH 5.0 5.0 - 8.0   Glucose, UA >=500 (A) NEGATIVE mg/dL   Hgb urine dipstick NEGATIVE NEGATIVE   Bilirubin Urine NEGATIVE NEGATIVE   Ketones, ur NEGATIVE NEGATIVE mg/dL   Protein, ur 899 (A) NEGATIVE mg/dL   Nitrite NEGATIVE NEGATIVE   Leukocytes,Ua TRACE (A) NEGATIVE   RBC / HPF 0-5 0 - 5 RBC/hpf   WBC, UA 11-20 0 - 5 WBC/hpf   Bacteria, UA MANY (A) NONE SEEN   Squamous  Epithelial /  HPF 0 0 - 5 /HPF   Mucus PRESENT    Amorphous Crystal PRESENT     Comment: Performed at [facility], [address] [place], [place] [address]  Lipase, blood     Status: Abnormal   Collection Time: 08/28/23  3:22 PM  Result Value Ref Range   Lipase 76 (H) 11 - 51 U/L    Comment: Performed at [facility], [address] [place], [place] [address]  Comprehensive metabolic panel     Status: Abnormal   Collection Time: 08/28/23  3:22 PM  Result Value Ref Range   Sodium 139 135 - 145 mmol/L   Potassium 3.4 (L) 3.5 - 5.1 mmol/L   Chloride 101 98 - 111 mmol/L   CO2 22 22 - 32 mmol/L   Glucose, Bld 143 (H) 70 - 99 mg/dL    Comment: Glucose reference range applies only to samples taken after fasting for at least 8 hours.   BUN 23 8 - 23 mg/dL   Creatinine, Ser 7.75 (H) 0.44 - 1.00 mg/dL   Calcium  9.4 8.9 - 10.3 mg/dL   Total Protein 7.7 6.5 - 8.1 g/dL   Albumin 4.0 3.5 - 5.0 g/dL   AST 34 15 - 41 U/L   ALT 25 0 - 44 U/L   Alkaline Phosphatase 258 (H) 38 - 126 U/L   Total Bilirubin 0.3 0.0 - 1.2 mg/dL   GFR, Estimated 24 (L) >60 mL/min    Comment: (NOTE) Calculated using the CKD-EPI Creatinine Equation (2021)    Anion gap 16 (H) 5 - 15    Comment: Performed at [facility], [address] [place], [place] [address]  CBC     Status: Abnormal   Collection Time: 08/28/23  3:22 PM  Result Value Ref Range   WBC 9.4 4.0 - 10.5 K/uL   RBC 3.70 (L) 3.87 - 5.11 MIL/uL   Hemoglobin 11.4 (L) 12.0 - 15.0 g/dL   HCT 67.1 (L) 63.9 - 53.9 %   MCV 88.6 80.0 - 100.0 fL   MCH 30.8 26.0 - 34.0 pg   MCHC 34.8 30.0 - 36.0 g/dL   RDW 88.0 88.4 - 84.4 %   Platelets 266 150 - 400 K/uL   nRBC 0.0 0.0 - 0.2 %    Comment: Performed at [facility], [address] [place], [place] [address]  Lipase, blood     Status: Abnormal   Collection Time: 09/25/23 11:28 AM  Result Value Ref Range   Lipase 117 (H) 11 - 51 U/L    Comment: Performed at  [facility], [address] [place], [place] [address]  Comprehensive metabolic panel     Status: Abnormal   Collection Time: 09/25/23 11:28 AM  Result Value Ref Range   Sodium 137 135 - 145 mmol/L   Potassium 3.9 3.5 - 5.1 mmol/L   Chloride 99 98 - 111 mmol/L   CO2 24 22 - 32 mmol/L   Glucose, Bld 93 70 - 99 mg/dL    Comment: Glucose reference range applies only to samples taken after fasting for at least 8 hours.   BUN 38 (H) 8 - 23 mg/dL   Creatinine, Ser 7.72 (H) 0.44 - 1.00 mg/dL   Calcium  8.6 (L) 8.9 - 10.3 mg/dL   Total Protein 7.2 6.5 - 8.1 g/dL   Albumin 3.6 3.5 - 5.0 g/dL   AST 76 (H) 15 - 41 U/L   ALT 37 0 - 44 U/L  Alkaline Phosphatase 270 (H) 38 - 126 U/L   Total Bilirubin 0.5 0.0 - 1.2 mg/dL   GFR, Estimated 23 (L) >60 mL/min    Comment: (NOTE) Calculated using the CKD-EPI Creatinine Equation (2021)    Anion gap 14 5 - 15    Comment: Performed at [facility], [address] [place], [place] [address]  CBC     Status: Abnormal   Collection Time: 09/25/23 11:28 AM  Result Value Ref Range   WBC 7.4 4.0 - 10.5 K/uL   RBC 3.52 (L) 3.87 - 5.11 MIL/uL   Hemoglobin 10.5 (L) 12.0 - 15.0 g/dL   HCT 68.3 (L) 63.9 - 53.9 %   MCV 89.8 80.0 - 100.0 fL   MCH 29.8 26.0 - 34.0 pg   MCHC 33.2 30.0 - 36.0 g/dL   RDW 88.0 88.4 - 84.4 %   Platelets 236 150 - 400 K/uL   nRBC 0.0 0.0 - 0.2 %    Comment: Performed at [facility], [address] [place], [place] [address]  Urinalysis, Routine w reflex microscopic -Urine, Catheterized     Status: Abnormal   Collection Time: 09/25/23 11:28 AM  Result Value Ref Range   Color, Urine YELLOW (A) YELLOW   APPearance CLOUDY (A) CLEAR   Specific Gravity, Urine 1.003 (L) 1.005 - 1.030   pH 5.0 5.0 - 8.0   Glucose, UA >=500 (A) NEGATIVE mg/dL   Hgb urine dipstick MODERATE (A) NEGATIVE   Bilirubin Urine NEGATIVE NEGATIVE   Ketones, ur NEGATIVE NEGATIVE mg/dL   Protein, ur 899 (A) NEGATIVE mg/dL    Nitrite NEGATIVE NEGATIVE   Leukocytes,Ua MODERATE (A) NEGATIVE   RBC / HPF 6-10 0 - 5 RBC/hpf   WBC, UA 21-50 0 - 5 WBC/hpf   Bacteria, UA MANY (A) NONE SEEN   Squamous Epithelial / HPF 0-5 0 - 5 /HPF   Mucus PRESENT    Budding Yeast PRESENT     Comment: Performed at [facility], [address] [place], [place] [address]  Urinalysis, Routine w reflex microscopic -Urine, Catheterized     Status: Abnormal   Collection Time: 09/25/23 12:37 PM  Result Value Ref Range   Color, Urine STRAW (A) YELLOW   APPearance HAZY (A) CLEAR   Specific Gravity, Urine 1.001 (L) 1.005 - 1.030   pH 6.0 5.0 - 8.0   Glucose, UA >=500 (A) NEGATIVE mg/dL   Hgb urine dipstick MODERATE (A) NEGATIVE   Bilirubin Urine NEGATIVE NEGATIVE   Ketones, ur NEGATIVE NEGATIVE mg/dL   Protein, ur 899 (A) NEGATIVE mg/dL   Nitrite NEGATIVE NEGATIVE   Leukocytes,Ua TRACE (A) NEGATIVE   RBC / HPF 21-50 0 - 5 RBC/hpf   WBC, UA 21-50 0 - 5 WBC/hpf   Bacteria, UA MANY (A) NONE SEEN   Squamous Epithelial / HPF 0 0 - 5 /HPF   Budding Yeast PRESENT     Comment: Performed at [facility], [address] [place], [place] [address]  Urine Culture     Status: Abnormal   Collection Time: 09/25/23 12:37 PM   Specimen: Urine, Catheterized  Result Value Ref Range   Specimen Description      URINE, CATHETERIZED Performed at [facility], [address], [place], [place] [address]    Special Requests      NONE Performed at [facility], [address] [place], [place] [address]    Culture (A)     >=100,000 COLONIES/mL ESCHERICHIA COLI  Confirmed Extended Spectrum Beta-Lactamase Producer (ESBL).  In bloodstream infections from ESBL organisms, carbapenems are preferred over piperacillin/tazobactam. They are shown to have a lower risk of mortality.    Report Status 09/27/2023 FINAL    Organism ID, Bacteria ESCHERICHIA COLI (A)       Susceptibility   Escherichia coli - MIC*    AMPICILLIN  >=32 RESISTANT Resistant     CEFAZOLIN (URINE) Value in next row Resistant      >=32 RESISTANTThis is a modified FDA-approved test that has been validated and its performance characteristics determined by the reporting laboratory.  This laboratory is certified under the Clinical Laboratory Improvement Amendments CLIA as qualified to perform high complexity clinical laboratory testing.    CEFEPIME Value in next row Intermediate      >=32 RESISTANTThis is a modified FDA-approved test that has been validated and its performance characteristics determined by the reporting laboratory.  This laboratory is certified under the Clinical Laboratory Improvement Amendments CLIA as qualified to perform high complexity clinical laboratory testing.    ERTAPENEM Value in next row Sensitive      >=32 RESISTANTThis is a modified FDA-approved test that has been validated and its performance characteristics determined by the reporting laboratory.  This laboratory is certified under the Clinical Laboratory Improvement Amendments CLIA as qualified to perform high complexity clinical laboratory testing.    CEFTRIAXONE  Value in next row Resistant      >=32 RESISTANTThis is a modified FDA-approved test that has been validated and its performance characteristics determined by the reporting laboratory.  This laboratory is certified under the Clinical Laboratory Improvement Amendments CLIA as qualified to perform high complexity clinical laboratory testing.    CIPROFLOXACIN  Value in next row Resistant      >=32 RESISTANTThis is a modified FDA-approved test that has been validated and its performance characteristics determined by the reporting laboratory.  This laboratory is certified under the Clinical Laboratory Improvement Amendments CLIA as qualified to perform high complexity clinical laboratory testing.    GENTAMICIN Value in next row Sensitive      >=32 RESISTANTThis is a modified FDA-approved test that has been validated and  its performance characteristics determined by the reporting laboratory.  This laboratory is certified under the Clinical Laboratory Improvement Amendments CLIA as qualified to perform high complexity clinical laboratory testing.    NITROFURANTOIN  Value in next row Sensitive      >=32 RESISTANTThis is a modified FDA-approved test that has been validated and its performance characteristics determined by the reporting laboratory.  This laboratory is certified under the Clinical Laboratory Improvement Amendments CLIA as qualified to perform high complexity clinical laboratory testing.    TRIMETH /SULFA  Value in next row Resistant      >=32 RESISTANTThis is a modified FDA-approved test that has been validated and its performance characteristics determined by the reporting laboratory.  This laboratory is certified under the Clinical Laboratory Improvement Amendments CLIA as qualified to perform high complexity clinical laboratory testing.    AMPICILLIN/SULBACTAM Value in next row Resistant      >=32 RESISTANTThis is a modified FDA-approved test that has been validated and its performance characteristics determined by the reporting laboratory.  This laboratory is certified under the Clinical Laboratory Improvement Amendments CLIA as qualified to perform high complexity clinical laboratory testing.    PIP/TAZO Value in next row Sensitive      <=4 SENSITIVEThis is a modified FDA-approved test that has been validated and its performance characteristics determined by the reporting laboratory.  This laboratory  is certified under the Clinical Laboratory Improvement Amendments CLIA as qualified to perform high complexity clinical laboratory testing.    MEROPENEM Value in next row Sensitive      <=4 SENSITIVEThis is a modified FDA-approved test that has been validated and its performance characteristics determined by the reporting laboratory.  This laboratory is certified under the Clinical Laboratory Improvement  Amendments CLIA as qualified to perform high complexity clinical laboratory testing.    * >=100,000 COLONIES/mL ESCHERICHIA COLI       Assessment & Plan Type 2 diabetes mellitus with hyperlipidemia (HCC) Continue current diabetes POC, as patient has been well controlled on current regimen.  Will adjust meds if needed based on labs.   Primary hypertension Blood pressure well controlled with current medications.  Continue current therapy.  Will reassess at follow up.   Gastroesophageal reflux disease without esophagitis Patient stable.  Well controlled with current therapy.   Continue current meds.   Mixed hyperlipidemia Continue current therapy for lipid control. Will modify as needed based on labwork results.   Chronic kidney disease, stage 3b [facility]) Patient is seen by nephrology, who manage this condition.  She is well controlled with current therapy.   Will defer to them for further changes to plan of care.  Vitamin D  deficiency, unspecified B12 deficiency due to diet Other fatigue Will continue supplements as needed.   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  Will send refill to help manage until she is seen.      Return in about 3 months (around 01/19/2024).   Total time spent: 20 minutes  ALAN CHRISTELLA ARRANT, FNP  10/19/2023   This document may have been prepared by [facility] Voice Recognition software and as such may include unintentional dictation errors.

## 2023-10-23 NOTE — Assessment & Plan Note (Signed)
Patient is seen by nephrology, who manage this condition.  She is well controlled with current therapy.   Will defer to them for further changes to plan of care.

## 2023-10-23 NOTE — Assessment & Plan Note (Signed)
 Setting up referral to Pain Management for continued management.  Will send refill to help manage until she is seen.

## 2023-10-26 ENCOUNTER — Telehealth: Payer: Self-pay

## 2023-10-26 ENCOUNTER — Other Ambulatory Visit: Payer: Self-pay

## 2023-10-26 NOTE — Telephone Encounter (Signed)
 Pt states her cat knocked over her valium  and they spilled down the sink drain.

## 2023-10-28 MED ORDER — DIAZEPAM 2 MG PO TABS: 2.0000 mg | ORAL_TABLET | Freq: Two times a day (BID) | ORAL | 2 refills | Status: DC | PRN

## 2023-10-28 NOTE — Telephone Encounter (Signed)
Pt informed

## 2023-11-16 ENCOUNTER — Other Ambulatory Visit: Payer: Self-pay | Admitting: Family

## 2023-11-17 ENCOUNTER — Ambulatory Visit: Admitting: Family Medicine

## 2023-11-17 MED ORDER — ONDANSETRON 4 MG PO TBDP: 4.0000 mg | ORAL_TABLET | Freq: Three times a day (TID) | ORAL | 1 refills | Status: AC | PRN

## 2023-11-18 ENCOUNTER — Ambulatory Visit: Admitting: Physician Assistant

## 2023-11-18 ENCOUNTER — Ambulatory Visit (INDEPENDENT_AMBULATORY_CARE_PROVIDER_SITE_OTHER): Admitting: Physician Assistant

## 2023-11-18 DIAGNOSIS — Z435 Encounter for attention to cystostomy: Secondary | ICD-10-CM

## 2023-11-18 DIAGNOSIS — R339 Retention of urine, unspecified: Secondary | ICD-10-CM | POA: Diagnosis not present

## 2023-11-18 NOTE — Progress Notes (Signed)
 Suprapubic Cath Change   Patient is present today for a suprapubic catheter change due to urinary retention.  10ml of water was drained from the balloon, a 16FR foley cath was removed from the tract without difficulty.  Site was cleaned and prepped in a sterile fashion with betadine.  A 16FR foley cath was replaced into the tract no complications were noted. Urine return was noted, 10 ml of sterile water was inflated into the balloon and a leg bag was attached for drainage.  Patient tolerated well.    Performed by: Mathew Pinal, RN

## 2023-11-22 ENCOUNTER — Other Ambulatory Visit: Payer: Self-pay | Admitting: Family

## 2023-11-27 ENCOUNTER — Emergency Department
Admission: EM | Admit: 2023-11-27 | Discharge: 2023-11-27 | Disposition: A | Discharge status: Home / Self Care | Attending: Emergency Medicine | Admitting: Emergency Medicine

## 2023-11-27 ENCOUNTER — Emergency Department

## 2023-11-27 ENCOUNTER — Other Ambulatory Visit: Payer: Self-pay

## 2023-11-27 DIAGNOSIS — R519 Headache, unspecified: Secondary | ICD-10-CM | POA: Diagnosis present

## 2023-11-27 DIAGNOSIS — R42 Dizziness and giddiness: Secondary | ICD-10-CM | POA: Diagnosis not present

## 2023-11-27 DIAGNOSIS — R112 Nausea with vomiting, unspecified: Secondary | ICD-10-CM | POA: Insufficient documentation

## 2023-11-27 DIAGNOSIS — E119 Type 2 diabetes mellitus without complications: Secondary | ICD-10-CM | POA: Diagnosis not present

## 2023-11-27 LAB — COMPREHENSIVE METABOLIC PANEL WITH GFR
ALT: 33 U/L (ref 0–44)
AST: 57 U/L — ABNORMAL HIGH (ref 15–41)
Albumin: 4.6 g/dL (ref 3.5–5.0)
Alkaline Phosphatase: 360 U/L — ABNORMAL HIGH (ref 38–126)
Anion gap: 14 (ref 5–15)
BUN: 31 mg/dL — ABNORMAL HIGH (ref 8–23)
CO2: 23 mmol/L (ref 22–32)
Calcium: 9.3 mg/dL (ref 8.9–10.3)
Chloride: 100 mmol/L (ref 98–111)
Creatinine, Ser: 2.26 mg/dL — ABNORMAL HIGH (ref 0.44–1.00)
GFR, Estimated: 23 mL/min — ABNORMAL LOW (ref >60)
Glucose, Bld: 200 mg/dL — ABNORMAL HIGH (ref 70–99)
Potassium: 4.2 mmol/L (ref 3.5–5.1)
Sodium: 136 mmol/L (ref 135–145)
Total Bilirubin: 0.4 mg/dL (ref 0.0–1.2)
Total Protein: 8 g/dL (ref 6.5–8.1)

## 2023-11-27 LAB — CBC
HCT: 31.3 % — ABNORMAL LOW (ref 36.0–46.0)
Hemoglobin: 10.8 g/dL — ABNORMAL LOW (ref 12.0–15.0)
MCH: 30.9 pg (ref 26.0–34.0)
MCHC: 34.5 g/dL (ref 30.0–36.0)
MCV: 89.4 fL (ref 80.0–100.0)
Platelets: 244 K/uL (ref 150–400)
RBC: 3.5 MIL/uL — ABNORMAL LOW (ref 3.87–5.11)
RDW: 12.4 % (ref 11.5–15.5)
WBC: 5.4 K/uL (ref 4.0–10.5)
nRBC: 0 % (ref 0.0–0.2)

## 2023-11-27 LAB — URINALYSIS, ROUTINE W REFLEX MICROSCOPIC
Bilirubin Urine: NEGATIVE
Glucose, UA: >=500 mg/dL — AB
Hgb urine dipstick: NEGATIVE
Ketones, ur: NEGATIVE mg/dL
Nitrite: NEGATIVE
Protein, ur: 100 mg/dL — AB
Specific Gravity, Urine: 1.007 (ref 1.005–1.030)
pH: 6 (ref 5.0–8.0)

## 2023-11-27 LAB — LIPASE, BLOOD: Lipase: 123 U/L — ABNORMAL HIGH (ref 11–51)

## 2023-11-27 MED ORDER — MAGNESIUM SULFATE 2 GM/50ML IV SOLN
2.0000 g | Freq: Once | INTRAVENOUS | Status: AC
  Administered 2023-11-27: 2 g via INTRAVENOUS
  Filled 2023-11-27: qty 50

## 2023-11-27 MED ORDER — KETOROLAC TROMETHAMINE 30 MG/ML IJ SOLN
15.0000 mg | Freq: Once | INTRAMUSCULAR | Status: AC
  Administered 2023-11-27: 15 mg via INTRAVENOUS
  Filled 2023-11-27: qty 1

## 2023-11-27 MED ORDER — LABETALOL HCL 5 MG/ML IV SOLN
10.0000 mg | Freq: Once | INTRAVENOUS | Status: AC
  Administered 2023-11-27: 10 mg via INTRAVENOUS
  Filled 2023-11-27: qty 4

## 2023-11-27 MED ORDER — ACETAMINOPHEN 325 MG PO TABS
650.0000 mg | ORAL_TABLET | Freq: Once | ORAL | Status: DC
  Filled 2023-11-27: qty 2

## 2023-11-27 MED ORDER — LACTATED RINGERS IV BOLUS
500.0000 mL | Freq: Once | INTRAVENOUS | Status: AC
  Administered 2023-11-27: 500 mL via INTRAVENOUS

## 2023-11-27 MED ORDER — ONDANSETRON HCL 4 MG/2ML IJ SOLN
4.0000 mg | Freq: Once | INTRAMUSCULAR | Status: AC
  Administered 2023-11-27: 4 mg via INTRAVENOUS
  Filled 2023-11-27: qty 2

## 2023-11-27 MED ORDER — DIPHENHYDRAMINE HCL 50 MG/ML IJ SOLN
25.0000 mg | Freq: Once | INTRAMUSCULAR | Status: AC
  Administered 2023-11-27: 25 mg via INTRAVENOUS
  Filled 2023-11-27: qty 1

## 2023-11-27 MED ORDER — BUTALBITAL-APAP-CAFFEINE 50-325-40 MG PO TABS: 1.0000 | ORAL_TABLET | Freq: Four times a day (QID) | ORAL | 0 refills | Status: AC | PRN

## 2023-11-27 NOTE — ED Provider Notes (Signed)
 [facility] Provider Note   Event Date/Time   First MD Initiated Contact with Patient 11/27/23 [phone]     (approximate) History  Headache  HPI Karen [name] is a 68 y.o. female with a past medical history of suprapubic catheter, type 2 diabetes, emphysematous cystitis, chronic pancreatitis, diabetes, and alcohol abuse who presents complaining of of headache with associated nausea, vomiting, and intermittent vertigo.  Patient denies any history of migraines or symptoms similar to this in the past.  Patient denies any vision changes or unilateral symptoms.  Patient states that she has been trying Tylenol  at home with minimal relief.  Patient denies any recent travel, sick contacts, or food at the ordinary ROS: Patient currently denies any vision changes, tinnitus, difficulty speaking, facial droop, sore throat, chest pain, shortness of breath, abdominal pain, diarrhea, dysuria, or weakness/numbness/paresthesias in any extremity   Physical Exam  Triage Vital Signs: ED Triage Vitals [11/27/23 0703]  Encounter Vitals Group     BP (!) 188/83     Girls Systolic BP Percentile      Girls Diastolic BP Percentile      Boys Systolic BP Percentile      Boys Diastolic BP Percentile      Pulse Rate 82     Resp 18     Temp 98.1 F (36.7 C)     Temp Source Oral     SpO2 100 %     Weight 95 lb (43.1 kg)     Height 5' 1 (1.549 m)     Head Circumference      Peak Flow      Pain Score 9     Pain Loc      Pain Education      Exclude from Growth Chart    Most recent vital signs: Vitals:   11/27/23 1030 11/27/23 1100  BP: (!) 145/38 (!) 151/61  Pulse: 78 76  Resp: 19 19  Temp:    SpO2: 99% 99%   General: Awake, oriented x4. CV:  Good peripheral perfusion. Resp:  Normal effort. Abd:  No distention.  Suprapubic catheter in place.  Mild tenderness to palpation mid epigastric region Other:  Elderly well-developed, well-nourished Asian female resting comfortably in no  acute distress ED Results / Procedures / Treatments  Labs (all labs ordered are listed, but only abnormal results are displayed) Labs Reviewed  LIPASE, BLOOD - Abnormal; Notable for the following components:      Result Value   Lipase 123 (*)    All other components within normal limits  COMPREHENSIVE METABOLIC PANEL WITH GFR - Abnormal; Notable for the following components:   Glucose, Bld 200 (*)    BUN 31 (*)    Creatinine, Ser 2.26 (*)    AST 57 (*)    Alkaline Phosphatase 360 (*)    GFR, Estimated 23 (*)    All other components within normal limits  CBC - Abnormal; Notable for the following components:   RBC 3.50 (*)    Hemoglobin 10.8 (*)    HCT 31.3 (*)    All other components within normal limits  URINALYSIS, ROUTINE W REFLEX MICROSCOPIC - Abnormal; Notable for the following components:   Color, Urine STRAW (*)    APPearance CLEAR (*)    Glucose, UA >=500 (*)    Protein, ur 100 (*)    Leukocytes,Ua TRACE (*)    Bacteria, UA MANY (*)    All other components within normal limits  EKG ED ECG REPORT I, Artist MARLA Kerns, the attending physician, personally viewed and interpreted this ECG. Date: 11/27/2023 EKG Time: 0750 Rate: 75 Rhythm: normal sinus rhythm QRS Axis: normal Intervals: normal ST/T Wave abnormalities: normal Narrative Interpretation: no evidence of acute ischemia RADIOLOGY ED MD interpretation: CT of the head without contrast interpreted by me shows no evidence of acute abnormalities including no intracerebral hemorrhage, obvious masses, or significant edema - All radiology independently interpreted and agree with radiology assessment Official radiology report(s): CT Head Wo Contrast Result Date: 11/27/2023 EXAM: CT HEAD WITHOUT CONTRAST 11/27/2023 08:00:28 AM TECHNIQUE: CT of the head was performed without the administration of intravenous contrast. Automated exposure control, iterative reconstruction, and/or weight based adjustment of the mA/kV was  utilized to reduce the radiation dose to as low as reasonably achievable. COMPARISON: Head CT 10/27/2022. Brain MRI 11/15/2019. CLINICAL HISTORY: 68 year old female. Neuro deficit, acute, stroke suspected. FINDINGS: BRAIN AND VENTRICLES: No acute hemorrhage. No evidence of acute infarct. No hydrocephalus. No extra-axial collection. No mass effect or midline shift. Brain volume is stable and within normal limits for age. Gray white differentiation is stable and within normal limits. Chronic asymmetric left basal ganglia vascular calcification. No suspicious intracranial vascular hyperdensity. Advanced calcified atherosclerosis at the skull base. ORBITS: No acute abnormality. SINUSES: Visible paranasal sinuses, middle ears and mastoids remain well aerated. SOFT TISSUES AND SKULL: No acute soft tissue abnormality. No skull fracture. Skull mineralization is stable compared to 2021 CT, although heterogeneous. Nonspecific small lucent lesions of the skull (series 3 image 73 on the left) are stable since 2021 arguing against any active malignant or metastatic process. IMPRESSION: 1. No acute intracranial abnormality. 2. Advanced calcified atherosclerosis. But negative for age non-contrast CT appearance of the brain. Electronically signed by: Helayne Hurst MD 11/27/2023 08:18 AM EST RP Workstation: [id number]   PROCEDURES: Critical Care performed: No Procedures MEDICATIONS ORDERED IN ED: Medications  ondansetron  (ZOFRAN ) injection 4 mg (4 mg Intravenous Given 11/27/23 0745)  lactated ringers  bolus 500 mL (0 mLs Intravenous Stopped 11/27/23 1205)  labetalol  (NORMODYNE ) injection 10 mg (10 mg Intravenous Given 11/27/23 0844)  ketorolac  (TORADOL ) 30 MG/ML injection 15 mg (15 mg Intravenous Given 11/27/23 1039)  magnesium  sulfate IVPB 2 g 50 mL (0 g Intravenous Stopped 11/27/23 1146)  diphenhydrAMINE  (BENADRYL ) injection 25 mg (25 mg Intravenous Given 11/27/23 1037)   IMPRESSION / MDM / ASSESSMENT AND PLAN / ED  COURSE  I reviewed the triage vital signs and the nursing notes.                             The patient is on the cardiac monitor to evaluate for evidence of arrhythmia and/or significant heart rate changes. Patient's presentation is most consistent with acute presentation with potential threat to life or bodily function. Patient is a 68 year old female with the above-stated past medical history that presents for 1 day of headache, nausea, vomiting, and intermittent vertigo. DDx: Vertigo, migraine, CVA, TIA Plan: CBC, CMP, lipase, UA, head CT, EKG Nausea control with Zofran   Patient's headache persisted despite negative workup and therefore will trial blood pressure control with labetalol  Blood pressure improved with Systolics down into the 150s however headache persisted and patient was given headache cocktail  Patient's headache significantly improved after headache cocktail will plan on discharge home with a short course of Fioricet  if this headache recurs.  Patient was encouraged to follow-up with her primary care physician for further evaluation and management  if these headaches recur.  Patient expresses understanding and agrees with plan.  Patient given strict return precautions and all questions answered prior to discharge  Dispo: Discharge home with PCP follow-up as needed   FINAL CLINICAL IMPRESSION(S) / ED DIAGNOSES   Final diagnoses:  Acute nonintractable headache, unspecified headache type  Nausea and vomiting, unspecified vomiting type   Rx / DC Orders   ED Discharge Orders          Ordered    butalbital -acetaminophen -caffeine  (FIORICET ) 50-325-40 MG tablet  Every 6 hours PRN        11/27/23 1147           Note:  This document was prepared using Dragon voice recognition software and may include unintentional dictation errors.   Jossie Artist POUR, MD 11/27/23 [phone]

## 2023-11-27 NOTE — ED Notes (Signed)
 Pt asking for a diet soda. Pt given diet ginger ale.

## 2023-11-27 NOTE — ED Triage Notes (Signed)
 Pt presents for 2 days of headache with nausea, vomiting and this morning, now has dizziness when looking down. Able to walk with steady gait. Denies falls or injury to head. Endorsing BP being high at home.

## 2023-11-29 ENCOUNTER — Other Ambulatory Visit: Payer: Self-pay | Admitting: Family

## 2023-12-06 ENCOUNTER — Other Ambulatory Visit: Payer: Self-pay | Admitting: Family

## 2023-12-16 ENCOUNTER — Telehealth: Payer: Self-pay | Admitting: *Deleted

## 2023-12-16 ENCOUNTER — Ambulatory Visit (INDEPENDENT_AMBULATORY_CARE_PROVIDER_SITE_OTHER): Admitting: Urology

## 2023-12-16 DIAGNOSIS — Z435 Encounter for attention to cystostomy: Secondary | ICD-10-CM

## 2023-12-16 NOTE — Progress Notes (Signed)
 Suprapubic Cath Change  Patient is present today for a suprapubic catheter change due to urinary retention.  9 ml of water was drained from the balloon, a 16 FR foley cath was removed from the tract with out difficulty.  Site was cleaned and prepped in a sterile fashion with betadine.  A 16 FR silastic foley cath was replaced into the tract no complications were noted. Urine return was noted, 10 ml of sterile water was inflated into the balloon and a leg bag was attached for drainage.  Patient tolerated well. A night bag was given to patient.   Performed by: Mathew Pinal, RN  Follow up: 01-18-2024  for 1 month SP tube change

## 2023-12-16 NOTE — Telephone Encounter (Signed)
 Patient called in today and states she notice some dark red blood in her bag after her cath exchanged today, I advise patient that is okay just watch out for any blood clots. She states there was no blood clots noticed . Advised her to drink water to see if that would clear up her urine . Please let us  know if she has any other problems or questions.

## 2023-12-20 ENCOUNTER — Other Ambulatory Visit: Payer: Self-pay | Admitting: Family

## 2023-12-20 ENCOUNTER — Other Ambulatory Visit: Payer: Self-pay

## 2023-12-22 ENCOUNTER — Other Ambulatory Visit: Payer: Self-pay | Admitting: Family

## 2023-12-28 ENCOUNTER — Other Ambulatory Visit: Payer: Self-pay | Admitting: Family

## 2023-12-30 ENCOUNTER — Emergency Department
Admission: EM | Admit: 2023-12-30 | Discharge: 2023-12-30 | Discharge status: Against Medical Advice | Attending: Emergency Medicine | Admitting: Emergency Medicine

## 2023-12-30 ENCOUNTER — Other Ambulatory Visit: Payer: Self-pay

## 2023-12-30 DIAGNOSIS — R102 Pelvic and perineal pain unspecified side: Secondary | ICD-10-CM | POA: Insufficient documentation

## 2023-12-30 DIAGNOSIS — Z5321 Procedure and treatment not carried out due to patient leaving prior to being seen by health care provider: Secondary | ICD-10-CM | POA: Diagnosis not present

## 2023-12-30 LAB — URINALYSIS, ROUTINE W REFLEX MICROSCOPIC
Bilirubin Urine: NEGATIVE
Glucose, UA: >=500 mg/dL — AB
Ketones, ur: NEGATIVE mg/dL
Leukocytes,Ua: NEGATIVE
Nitrite: NEGATIVE
Protein, ur: 100 mg/dL — AB
Specific Gravity, Urine: 1.002 — ABNORMAL LOW (ref 1.005–1.030)
Squamous Epithelial / HPF: 0 /HPF (ref 0–5)
pH: 5 (ref 5.0–8.0)

## 2023-12-30 NOTE — ED Notes (Signed)
 Pt's urine collected by [facility] cath bag

## 2023-12-30 NOTE — ED Notes (Signed)
 Contacted pt on her phone. Pt advised she left because her neck was hurting and she did not have her medications. Pt advised she was going to call her doctor when they open. Pt informed that she needed to come back if she had any further concerns.

## 2023-12-30 NOTE — ED Triage Notes (Signed)
 Pt has SP catheter and states around the insertion area is tender (no redness or swelling noted) and vaginal pain and burning. Painful to sit. SP is draining urine. Pt unsure how long has had the pain.

## 2023-12-30 NOTE — ED Notes (Signed)
 Pt not in room. Pt called with no answer.

## 2023-12-31 ENCOUNTER — Other Ambulatory Visit: Payer: Self-pay | Admitting: Family

## 2024-01-18 ENCOUNTER — Ambulatory Visit (INDEPENDENT_AMBULATORY_CARE_PROVIDER_SITE_OTHER): Admitting: Physician Assistant

## 2024-01-18 DIAGNOSIS — N319 Neuromuscular dysfunction of bladder, unspecified: Secondary | ICD-10-CM

## 2024-01-18 NOTE — Progress Notes (Signed)
 Suprapubic Cath Change  Patient is present today for a suprapubic catheter change due to urinary retention.  9 ml of water was drained from the balloon, a 16 FR silastic foley cath was removed from the tract with out difficulty.  Site was cleaned and prepped in a sterile fashion with betadine.  A 16 FR silastic foley cath was replaced into the tract no complications were noted. Urine return was noted, 10 ml of sterile water was inflated into the balloon and a leg bag was attached for drainage.  Patient tolerated well. A night bag was given to patient.   Performed by: Mathew Pinal, RN  Follow up: 1 month on 02-17-24

## 2024-01-19 ENCOUNTER — Ambulatory Visit: Admitting: Family

## 2024-01-19 ENCOUNTER — Encounter: Payer: Self-pay | Admitting: Family

## 2024-01-19 VITALS — BP 118/86 | HR 81 | Ht 60.0 in | Wt 98.8 lb

## 2024-01-19 DIAGNOSIS — I152 Hypertension secondary to endocrine disorders: Secondary | ICD-10-CM | POA: Diagnosis not present

## 2024-01-19 DIAGNOSIS — N1832 Chronic kidney disease, stage 3b: Secondary | ICD-10-CM

## 2024-01-19 DIAGNOSIS — E1169 Type 2 diabetes mellitus with other specified complication: Secondary | ICD-10-CM | POA: Diagnosis not present

## 2024-01-19 DIAGNOSIS — E785 Hyperlipidemia, unspecified: Secondary | ICD-10-CM

## 2024-01-19 DIAGNOSIS — E538 Deficiency of other specified B group vitamins: Secondary | ICD-10-CM | POA: Insufficient documentation

## 2024-01-19 DIAGNOSIS — K860 Alcohol-induced chronic pancreatitis: Secondary | ICD-10-CM

## 2024-01-19 DIAGNOSIS — Z13820 Encounter for screening for osteoporosis: Secondary | ICD-10-CM | POA: Diagnosis not present

## 2024-01-19 DIAGNOSIS — E1159 Type 2 diabetes mellitus with other circulatory complications: Secondary | ICD-10-CM

## 2024-01-19 DIAGNOSIS — Z1211 Encounter for screening for malignant neoplasm of colon: Secondary | ICD-10-CM | POA: Insufficient documentation

## 2024-01-19 DIAGNOSIS — Z1231 Encounter for screening mammogram for malignant neoplasm of breast: Secondary | ICD-10-CM | POA: Insufficient documentation

## 2024-01-19 DIAGNOSIS — R5382 Chronic fatigue, unspecified: Secondary | ICD-10-CM | POA: Insufficient documentation

## 2024-01-19 DIAGNOSIS — F1020 Alcohol dependence, uncomplicated: Secondary | ICD-10-CM

## 2024-01-19 DIAGNOSIS — K219 Gastro-esophageal reflux disease without esophagitis: Secondary | ICD-10-CM | POA: Diagnosis not present

## 2024-01-19 DIAGNOSIS — E559 Vitamin D deficiency, unspecified: Secondary | ICD-10-CM | POA: Insufficient documentation

## 2024-01-19 MED ORDER — HYDROCODONE-ACETAMINOPHEN 5-325 MG PO TABS: 1.0000 | ORAL_TABLET | Freq: Four times a day (QID) | ORAL | 0 refills | Status: DC | PRN

## 2024-01-19 MED ORDER — TIRZEPATIDE 7.5 MG/0.5ML ~~LOC~~ SOAJ: 7.5000 mg | SUBCUTANEOUS | 2 refills | Status: AC

## 2024-01-19 NOTE — Assessment & Plan Note (Signed)
-   Appropriate referrals sent. Reinforced need to get preventative screenings completed routinely.

## 2024-01-19 NOTE — Assessment & Plan Note (Signed)
-   Continue working on healthy diet and exercise as tolerated. - Increase Mounjaro  to 7.5 mg weekly injection. Continue medications as prescribed. - Check labs today

## 2024-01-19 NOTE — Assessment & Plan Note (Signed)
-   Continue medications as prescribed. - Call pain clinic and get established for them to take over your long term pain medications.

## 2024-01-19 NOTE — Assessment & Plan Note (Signed)
-   Check labs today - Supplementation recommended based off lab results and will notify patient at that time

## 2024-01-19 NOTE — Patient Instructions (Signed)
 Call pain clinic at [phone] Referral was sent 10/2023 to [facility].

## 2024-01-19 NOTE — Progress Notes (Signed)
 "  Established Patient Office Visit  Subjective:  Patient ID: Karen [name], female    DOB: 1955/03/30  Age: 69 y.o. MRN: [id number]  Chief Complaint  Patient presents with   Follow-up    3 month follow up    Patient is here today for her 3 months follow up.  She has been feeling fairly well since last appointment.   She does have additional concerns to discuss today. Patient went to Urology yesterday 16 FR silastic foley cath was replaced into the tract no complications were noted. She had gone to the ED 12/30/23 for pelvic pain and states her catheter had fallen out so they replaced it at that time.  She reports she also had gone to the hospital 11/2023 for severe headache and nausea/vomiting. At the time her BP was elevated. The performed Head CT showed no acute findings. BP improved with Labetalol  and headache resolved with migraine cocktail. She was sent home with prescription Fioricet  for migraine relief. Patient reports she has not needed to take any and has not had any more problems to date with headache.  She reports she never heard from pain clinic referral that was sent 10/2023. She states she is still having severe chronic abdominal pain from her chronic pancreatitis. She reports she is having difficulty with her pharmacy refilling her prescription when it is due and she is having to wait multiple days before she gets her refills. Will send short course refills until pain management takes over her prescriptions.   She reports she uses 10 units insulin  in the morning and 25 units at bedtime. Mounjaro  5 mg once weekly injection. She reports her blood sugar on average runs around 200's when she checks it. Will increase Mounjaro  7.5 mg weekly injection. Reinforced healthy diet and exercise as tolerated.  Labs are due today and she is fasting. She needs refills.   I have reviewed her active problem list, medication list, allergies, family history, social history, health maintenance,  notes from last encounter, lab results for her appointment today.   Patient is due for preventative screenings. GI referral sent. Mammogram ordered. Bone density ordered. Ophthalmology referral sent.     No other concerns at this time.   Past Medical History:  Diagnosis Date   Abdominal pain    Acute cystitis 05/18/2020   Acute kidney injury superimposed on CKD    Acute on chronic pancreatitis (HCC) 01/03/2017   Acute renal insufficiency 10/29/2015   Adhesive capsulitis of left shoulder    Alcoholic pancreatitis    Asthma    Depression    Diabetes mellitus without complication (HCC)    Diabetic cataract of right eye (HCC) 08/31/2019   Formatting of this note might be different from the original. Added automatically from request for surgery [id number]   DKA (diabetic ketoacidoses) 02/17/2015   DKA (diabetic ketoacidosis) (HCC) 02/17/2015   IMO SNOMED Dx Update Oct 2024     Elevated alkaline phosphatase level 10/30/2021   Emphysematous cystitis    Esophageal candidiasis (HCC)    ETOH abuse 01/03/2017   Headache 02/26/2019   Hypercholesteremia    Hypertension    Hypertensive urgency 04/21/2016   Hypokalemia    Left flank pain 10/29/2015   Left upper quadrant abdominal pain 05/18/2020   Leukocytosis 06/13/2019   Malignant essential hypertension 10/29/2015   Upper GI bleed 11/11/2017   UTI (urinary tract infection) 01/24/2018    Past Surgical History:  Procedure Laterality Date   ABDOMINAL HYSTERECTOMY  1996  APPENDECTOMY  1997   CYSTOSCOPY WITH FULGERATION N/A 05/27/2023   Procedure: CYSTOSCOPY, WITH BLADDER FULGURATION and clot evacuation;  Surgeon: Francisca Redell BROCKS, MD;  Location: [facility] ORS;  Service: Urology;  Laterality: N/A;   ESOPHAGOGASTRODUODENOSCOPY N/A 11/12/2017   Procedure: ESOPHAGOGASTRODUODENOSCOPY (EGD);  Surgeon: Unk Corinn Skiff, MD;  Location: [facility] ENDOSCOPY;  Service: Gastroenterology;  Laterality: N/A;   ESOPHAGOGASTRODUODENOSCOPY N/A 09/29/2018    Procedure: ESOPHAGOGASTRODUODENOSCOPY (EGD);  Surgeon: Toledo, Ladell POUR, MD;  Location: [facility] ENDOSCOPY;  Service: Gastroenterology;  Laterality: N/A;   ESOPHAGOGASTRODUODENOSCOPY (EGD) WITH PROPOFOL  N/A 04/24/2016   Procedure: ESOPHAGOGASTRODUODENOSCOPY (EGD) WITH PROPOFOL ;  Surgeon: Rogelia Copping, MD;  Location: [facility] ENDOSCOPY;  Service: Endoscopy;  Laterality: N/A;   ESOPHAGOGASTRODUODENOSCOPY (EGD) WITH PROPOFOL  N/A 01/28/2019   Procedure: ESOPHAGOGASTRODUODENOSCOPY (EGD) WITH PROPOFOL ;  Surgeon: Toledo, Ladell POUR, MD;  Location: [facility] ENDOSCOPY;  Service: Gastroenterology;  Laterality: N/A;   EYE SURGERY     HAND SURGERY  1988   IR CYSTOSTOMY TUBE PLACEMENT/BLADDER ASPIRATION  05/20/2023   THYROID  SURGERY  2013    Social History   Socioeconomic History   Marital status: Significant Other    Spouse name: Not on file   Number of children: 2   Years of education: Not on file   Highest education level: Not on file  Occupational History   Occupation: disabled  Tobacco Use   Smoking status: Former    Types: Cigarettes   Smokeless tobacco: Never   Tobacco comments:    quit 7 years ago   Vaping Use   Vaping status: Never Used  Substance and Sexual Activity   Alcohol use: Not Currently    Alcohol/week: 1.0 standard drink of alcohol    Types: 1 Glasses of wine per week   Drug use: No   Sexual activity: Not Currently  Other Topics Concern   Not on file  Social History Narrative   Lives with Cordella Molt, S.O.    Grandkids, great grandkids, & Great/great grandkids   Social Drivers of Health   Tobacco Use: Medium Risk (01/19/2024)   Patient History    Smoking Tobacco Use: Former    Smokeless Tobacco Use: Never    Passive Exposure: Not on file  Financial Resource Strain: Low Risk (10/31/2021)   Received from [facility]   Overall Financial Resource Strain (CARDIA)    Difficulty of Paying Living Expenses: Not hard at all  Food Insecurity: No Food Insecurity (05/21/2023)    Hunger Vital Sign    Worried About Running Out of Food in the Last Year: Never true    Ran Out of Food in the Last Year: Never true  Transportation Needs: No Transportation Needs (05/21/2023)   PRAPARE - Administrator, Civil Service (Medical): No    Lack of Transportation (Non-Medical): No  Physical Activity: Not on file  Stress: Not on file  Social Connections: Socially Isolated (05/21/2023)   Social Connection and Isolation Panel    Frequency of Communication with Friends and Family: Three times a week    Frequency of Social Gatherings with Friends and Family: Once a week    Attends Religious Services: Never    Database Administrator or Organizations: No    Attends Banker Meetings: Never    Marital Status: Divorced  Catering Manager Violence: Not At Risk (05/21/2023)   Humiliation, Afraid, Rape, and Kick questionnaire    Fear of Current or Ex-Partner: No    Emotionally Abused: No    Physically Abused: No  Sexually Abused: No  Depression (PHQ2-9): High Risk (04/12/2023)   Depression (PHQ2-9)    PHQ-2 Score: 20  Alcohol Screen: Not on file  Housing: Low Risk (05/21/2023)   Housing Stability Vital Sign    Unable to Pay for Housing in the Last Year: No    Number of Times Moved in the Last Year: 0    Homeless in the Last Year: No  Utilities: Not At Risk (05/21/2023)   AHC Utilities    Threatened with loss of utilities: No  Health Literacy: Not on file    Family History  Problem Relation Age of Onset   Diabetes Mother     Allergies[1]  Review of Systems  Constitutional:  Positive for malaise/fatigue.  HENT: Negative.    Eyes:  Negative for blurred vision and pain.  Respiratory:  Negative for cough and shortness of breath.   Cardiovascular:  Negative for chest pain, palpitations, claudication and leg swelling.  Gastrointestinal:  Positive for abdominal pain. Negative for blood in stool, constipation, diarrhea, nausea and vomiting.  Genitourinary:   Negative for dysuria, frequency and urgency.  Musculoskeletal:  Positive for back pain.  Skin: Negative.   Neurological:  Negative for dizziness, tingling, sensory change and headaches.  Endo/Heme/Allergies: Negative.   Psychiatric/Behavioral: Negative.         Objective:   BP 118/86   Pulse 81   Ht 5' (1.524 m)   Wt 98 lb 12.8 oz (44.8 kg)   SpO2 99%   BMI 19.30 kg/m   Vitals:   01/19/24 0850  BP: 118/86  Pulse: 81  Height: 5' (1.524 m)  Weight: 98 lb 12.8 oz (44.8 kg)  SpO2: 99%  BMI (Calculated): 19.3    Physical Exam Vitals and nursing note reviewed.  Constitutional:      Appearance: Normal appearance.  HENT:     Head: Normocephalic.  Eyes:     Extraocular Movements: Extraocular movements intact.     Pupils: Pupils are equal, round, and reactive to light.  Cardiovascular:     Rate and Rhythm: Normal rate and regular rhythm.     Pulses: Normal pulses.     Heart sounds: Normal heart sounds. No murmur heard. Pulmonary:     Effort: Pulmonary effort is normal. No respiratory distress.     Breath sounds: Normal breath sounds.  Abdominal:     General: There is no distension.     Tenderness: There is generalized abdominal tenderness.  Musculoskeletal:        General: No tenderness. Normal range of motion.     Cervical back: Normal range of motion and neck supple.     Right lower leg: No edema.     Left lower leg: No edema.  Skin:    General: Skin is warm and dry.     Coloration: Skin is not jaundiced.     Findings: No erythema.  Neurological:     General: No focal deficit present.     Mental Status: She is alert and oriented to person, place, and time.  Psychiatric:        Mood and Affect: Mood normal.        Speech: Speech normal.        Behavior: Behavior is cooperative.        Cognition and Memory: Memory is not impaired.      No results found for any visits on 01/19/24.  Recent Results (from the past 2160 hours)  Lipase, blood     Status:  Abnormal   Collection Time: 11/27/23  7:29 AM  Result Value Ref Range   Lipase 123 (H) 11 - 51 U/L    Comment: Performed at [facility], [address] [place], [place] [address]  Comprehensive metabolic panel     Status: Abnormal   Collection Time: 11/27/23  7:29 AM  Result Value Ref Range   Sodium 136 135 - 145 mmol/L   Potassium 4.2 3.5 - 5.1 mmol/L   Chloride 100 98 - 111 mmol/L   CO2 23 22 - 32 mmol/L   Glucose, Bld 200 (H) 70 - 99 mg/dL    Comment: Glucose reference range applies only to samples taken after fasting for at least 8 hours.   BUN 31 (H) 8 - 23 mg/dL   Creatinine, Ser 7.73 (H) 0.44 - 1.00 mg/dL   Calcium  9.3 8.9 - 10.3 mg/dL   Total Protein 8.0 6.5 - 8.1 g/dL   Albumin 4.6 3.5 - 5.0 g/dL   AST 57 (H) 15 - 41 U/L   ALT 33 0 - 44 U/L   Alkaline Phosphatase 360 (H) 38 - 126 U/L   Total Bilirubin 0.4 0.0 - 1.2 mg/dL   GFR, Estimated 23 (L) >60 mL/min    Comment: (NOTE) Calculated using the CKD-EPI Creatinine Equation (2021)    Anion gap 14 5 - 15    Comment: Performed at [facility], [address] [place], [place] [address]  CBC     Status: Abnormal   Collection Time: 11/27/23  7:29 AM  Result Value Ref Range   WBC 5.4 4.0 - 10.5 K/uL   RBC 3.50 (L) 3.87 - 5.11 MIL/uL   Hemoglobin 10.8 (L) 12.0 - 15.0 g/dL   HCT 68.6 (L) 63.9 - 53.9 %   MCV 89.4 80.0 - 100.0 fL   MCH 30.9 26.0 - 34.0 pg   MCHC 34.5 30.0 - 36.0 g/dL   RDW 87.5 88.4 - 84.4 %   Platelets 244 150 - 400 K/uL   nRBC 0.0 0.0 - 0.2 %    Comment: Performed at [facility], [address] [place], [place] [address]  Urinalysis, Routine w reflex microscopic -Urine, Clean Catch     Status: Abnormal   Collection Time: 11/27/23  9:27 AM  Result Value Ref Range   Color, Urine STRAW (A) YELLOW   APPearance CLEAR (A) CLEAR   Specific Gravity, Urine 1.007 1.005 - 1.030   pH 6.0 5.0 - 8.0   Glucose, UA >=500 (A) NEGATIVE mg/dL   Hgb urine dipstick NEGATIVE NEGATIVE    Bilirubin Urine NEGATIVE NEGATIVE   Ketones, ur NEGATIVE NEGATIVE mg/dL   Protein, ur 899 (A) NEGATIVE mg/dL   Nitrite NEGATIVE NEGATIVE   Leukocytes,Ua TRACE (A) NEGATIVE   RBC / HPF 0-5 0 - 5 RBC/hpf   WBC, UA 6-10 0 - 5 WBC/hpf   Bacteria, UA MANY (A) NONE SEEN   Squamous Epithelial / HPF 0-5 0 - 5 /HPF   Budding Yeast PRESENT     Comment: Performed at [facility], [address] [place], [place] [address]  Urinalysis, Routine w reflex microscopic -Urine, Suprapubic     Status: Abnormal   Collection Time: 12/30/23 10:27 AM  Result Value Ref Range   Color, Urine STRAW (A) YELLOW   APPearance CLEAR (A) CLEAR   Specific Gravity, Urine 1.002 (L) 1.005 - 1.030   pH 5.0 5.0 - 8.0   Glucose, UA >=500 (A) NEGATIVE mg/dL   Hgb  urine dipstick SMALL (A) NEGATIVE   Bilirubin Urine NEGATIVE NEGATIVE   Ketones, ur NEGATIVE NEGATIVE mg/dL   Protein, ur 899 (A) NEGATIVE mg/dL   Nitrite NEGATIVE NEGATIVE   Leukocytes,Ua NEGATIVE NEGATIVE   RBC / HPF 0-5 0 - 5 RBC/hpf   WBC, UA 6-10 0 - 5 WBC/hpf   Bacteria, UA RARE (A) NONE SEEN   Squamous Epithelial / HPF 0 0 - 5 /HPF    Comment: Performed at [facility], [address] [place], [place] [address]       Assessment & Plan:   Assessment & Plan Hypertension associated with diabetes (HCC) Type 2 diabetes mellitus with hyperlipidemia (HCC) Chronic kidney disease, stage 3b (HCC) Gastroesophageal reflux disease without esophagitis - Continue working on healthy diet and exercise as tolerated. - Increase Mounjaro  to 7.5 mg weekly injection. Continue medications as prescribed. 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 - Call pain clinic and get established for them to take over your long term pain medications. Vitamin D  deficiency Chronic fatigue Vitamin B12 deficiency - Check labs today - Supplementation recommended based off lab results and will  notify patient at that time  Breast cancer screening by mammogram Osteoporosis screening Colon cancer screening - Appropriate referrals sent. Reinforced need to get preventative screenings completed routinely.    Return in about 3 months (around 04/18/2024).   Total time spent: 30 minutes  Oddis DELENA Cain, FNP  01/19/2024   This document may have been prepared by [facility] Voice Recognition software and as such may include unintentional dictation errors.     [1]  Allergies Allergen Reactions   Metoclopramide  Other (See Comments) and Shortness Of Breath    Hypotension, shortness of breath  Hypotension, shortness of breath  Hypotension, shortness of breath  Hypotension, shortness of breath    Other Reaction(s): Other (See Comments)    Hypotension, shortness of breath  Hypotension, shortness of breath Hypotension, shortness of breath  Shortness of Breath, Hypotension   Penicillins Anaphylaxis, Rash and Swelling    Has patient had a PCN reaction causing immediate rash, facial/tongue/throat swelling, SOB or lightheadedness with hypotension: Yes  Has patient had a PCN reaction causing severe rash involving mucus membranes or skin necrosis: No  Has patient had a PCN reaction that required hospitalization No  Has patient had a PCN reaction occurring within the last 10 years: No  If all of the above answers are NO, then may proceed with Cephalosporin use.  Has patient had a PCN reaction causing immediate rash, facial/tongue/throat swelling, SOB or lightheadedness with hypotension: Yes  Has patient had a PCN reaction causing severe rash involving mucus membranes or skin necrosis: No  Has patient had a PCN reaction that required hospitalization No  Has patient had a PCN reaction occurring within the last 10 years: No  If all of the above answers are NO, then may proceed with Cephalosporin use.  Has patient had a PCN reaction causing immediate rash, facial/tongue/throat  swelling, SOB or lightheadedness with hypotension: Yes  Has patient had a PCN reaction causing severe rash involving mucus membranes or skin necrosis: No  Has patient had a PCN reaction that required hospitalization No  Has patient had a PCN reaction occurring within the last 10 years: No  If all of the above answers are NO, then may proceed with Cephalosporin use.  Has patient had a PCN reaction causing immediate rash, facial/tongue/throat swelling, SOB or lightheadedness with hypotension:  Yes Has patient had a PCN reaction causing severe rash involving mucus membranes or skin necrosis: No Has patient had a PCN reaction that required hospitalization No Has patient had a PCN reaction occurring within the last 10 years: No If all of the above answers are NO, then may proceed with Cephalosporin use.   Fentanyl  Rash    Rash   Other Rash    Bee sting   Bee sting  Bee sting    Bee sting   Bee sting   Oxycodone -Acetaminophen  Rash    plain tylenol  can also make itch?  plain tylenol  can also make itch?  plain tylenol  can also make itch?  plain tylenol  can also make itch?    plain tylenol  can also make itch?  plain tylenol  can also make itch? plain tylenol  can also make itch?   Percocet [Oxycodone -Acetaminophen ] Rash    plain tylenol  can also make itch?   "

## 2024-01-20 LAB — CMP14+EGFR
ALT: 21 IU/L (ref 0–32)
AST: 31 IU/L (ref 0–40)
Albumin: 4.4 g/dL (ref 3.9–4.9)
Alkaline Phosphatase: 336 IU/L — ABNORMAL HIGH (ref 49–135)
BUN/Creatinine Ratio: 9 — ABNORMAL LOW (ref 12–28)
BUN: 20 mg/dL (ref 8–27)
Bilirubin Total: 0.3 mg/dL (ref 0.0–1.2)
CO2: 19 mmol/L — ABNORMAL LOW (ref 20–29)
Calcium: 9.3 mg/dL (ref 8.7–10.3)
Chloride: 100 mmol/L (ref 96–106)
Creatinine, Ser: 2.24 mg/dL — ABNORMAL HIGH (ref 0.57–1.00)
Globulin, Total: 3.3 g/dL (ref 1.5–4.5)
Glucose: 175 mg/dL — ABNORMAL HIGH (ref 70–99)
Potassium: 4.1 mmol/L (ref 3.5–5.2)
Sodium: 139 mmol/L (ref 134–144)
Total Protein: 7.7 g/dL (ref 6.0–8.5)
eGFR: 24 mL/min/1.73 — ABNORMAL LOW

## 2024-01-20 LAB — CBC WITH DIFFERENTIAL/PLATELET
Basophils Absolute: 0.1 x10E3/uL (ref 0.0–0.2)
Basos: 1 %
EOS (ABSOLUTE): 0.1 x10E3/uL (ref 0.0–0.4)
Eos: 1 %
Hematocrit: 34.9 % (ref 34.0–46.6)
Hemoglobin: 11.4 g/dL (ref 11.1–15.9)
Immature Grans (Abs): 0.1 x10E3/uL (ref 0.0–0.1)
Immature Granulocytes: 1 %
Lymphocytes Absolute: 2 x10E3/uL (ref 0.7–3.1)
Lymphs: 27 %
MCH: 31.3 pg (ref 26.6–33.0)
MCHC: 32.7 g/dL (ref 31.5–35.7)
MCV: 96 fL (ref 79–97)
Monocytes Absolute: 0.5 x10E3/uL (ref 0.1–0.9)
Monocytes: 6 %
Neutrophils Absolute: 4.7 x10E3/uL (ref 1.4–7.0)
Neutrophils: 64 %
Platelets: 305 x10E3/uL (ref 150–450)
RBC: 3.64 x10E6/uL — ABNORMAL LOW (ref 3.77–5.28)
RDW: 12.8 % (ref 11.7–15.4)
WBC: 7.3 x10E3/uL (ref 3.4–10.8)

## 2024-01-20 LAB — LIPID PANEL
Chol/HDL Ratio: 2.6 ratio (ref 0.0–4.4)
Cholesterol, Total: 221 mg/dL — ABNORMAL HIGH (ref 100–199)
HDL: 86 mg/dL
LDL Chol Calc (NIH): 75 mg/dL (ref 0–99)
Triglycerides: 383 mg/dL — ABNORMAL HIGH (ref 0–149)
VLDL Cholesterol Cal: 60 mg/dL — ABNORMAL HIGH (ref 5–40)

## 2024-01-20 LAB — VITAMIN B12: Vitamin B-12: 661 pg/mL (ref 232–1245)

## 2024-01-20 LAB — TSH: TSH: 1.48 u[IU]/mL (ref 0.450–4.500)

## 2024-01-20 LAB — VITAMIN D 25 HYDROXY (VIT D DEFICIENCY, FRACTURES): Vit D, 25-Hydroxy: 14.5 ng/mL — ABNORMAL LOW (ref 30.0–100.0)

## 2024-01-20 LAB — HEMOGLOBIN A1C
Est. average glucose Bld gHb Est-mCnc: 186 mg/dL
Hgb A1c MFr Bld: 8.1 % — ABNORMAL HIGH (ref 4.8–5.6)

## 2024-01-25 ENCOUNTER — Other Ambulatory Visit: Payer: Self-pay | Admitting: Family

## 2024-01-31 ENCOUNTER — Other Ambulatory Visit: Payer: Self-pay | Admitting: Family

## 2024-02-03 ENCOUNTER — Other Ambulatory Visit: Payer: Self-pay | Admitting: Family

## 2024-02-10 ENCOUNTER — Other Ambulatory Visit: Payer: Self-pay

## 2024-02-10 ENCOUNTER — Emergency Department

## 2024-02-10 ENCOUNTER — Emergency Department
Admission: EM | Admit: 2024-02-10 | Discharge: 2024-02-10 | Disposition: A | Discharge status: Home / Self Care | Attending: Emergency Medicine | Admitting: Emergency Medicine

## 2024-02-10 DIAGNOSIS — R0789 Other chest pain: Secondary | ICD-10-CM | POA: Insufficient documentation

## 2024-02-10 DIAGNOSIS — I129 Hypertensive chronic kidney disease with stage 1 through stage 4 chronic kidney disease, or unspecified chronic kidney disease: Secondary | ICD-10-CM | POA: Insufficient documentation

## 2024-02-10 DIAGNOSIS — E1165 Type 2 diabetes mellitus with hyperglycemia: Secondary | ICD-10-CM | POA: Insufficient documentation

## 2024-02-10 DIAGNOSIS — N189 Chronic kidney disease, unspecified: Secondary | ICD-10-CM | POA: Insufficient documentation

## 2024-02-10 DIAGNOSIS — E1122 Type 2 diabetes mellitus with diabetic chronic kidney disease: Secondary | ICD-10-CM | POA: Insufficient documentation

## 2024-02-10 DIAGNOSIS — R739 Hyperglycemia, unspecified: Secondary | ICD-10-CM

## 2024-02-10 DIAGNOSIS — D649 Anemia, unspecified: Secondary | ICD-10-CM | POA: Insufficient documentation

## 2024-02-10 LAB — COMPREHENSIVE METABOLIC PANEL WITH GFR
ALT: 21 U/L (ref 0–44)
AST: 29 U/L (ref 15–41)
Albumin: 4.3 g/dL (ref 3.5–5.0)
Alkaline Phosphatase: 388 U/L — ABNORMAL HIGH (ref 38–126)
Anion gap: 16 — ABNORMAL HIGH (ref 5–15)
BUN: 17 mg/dL (ref 8–23)
CO2: 19 mmol/L — ABNORMAL LOW (ref 22–32)
Calcium: 9.3 mg/dL (ref 8.9–10.3)
Chloride: 99 mmol/L (ref 98–111)
Creatinine, Ser: 2.19 mg/dL — ABNORMAL HIGH (ref 0.44–1.00)
GFR, Estimated: 24 mL/min — ABNORMAL LOW
Glucose, Bld: 312 mg/dL — ABNORMAL HIGH (ref 70–99)
Potassium: 3.9 mmol/L (ref 3.5–5.1)
Sodium: 134 mmol/L — ABNORMAL LOW (ref 135–145)
Total Bilirubin: 0.4 mg/dL (ref 0.0–1.2)
Total Protein: 7.6 g/dL (ref 6.5–8.1)

## 2024-02-10 LAB — CBC
HCT: 29.8 % — ABNORMAL LOW (ref 36.0–46.0)
Hemoglobin: 10.5 g/dL — ABNORMAL LOW (ref 12.0–15.0)
MCH: 31 pg (ref 26.0–34.0)
MCHC: 35.2 g/dL (ref 30.0–36.0)
MCV: 87.9 fL (ref 80.0–100.0)
Platelets: 220 10*3/uL (ref 150–400)
RBC: 3.39 MIL/uL — ABNORMAL LOW (ref 3.87–5.11)
RDW: 11.6 % (ref 11.5–15.5)
WBC: 6.9 10*3/uL (ref 4.0–10.5)
nRBC: 0 % (ref 0.0–0.2)

## 2024-02-10 LAB — LIPASE, BLOOD: Lipase: 115 U/L — ABNORMAL HIGH (ref 11–51)

## 2024-02-10 MED ORDER — KETOROLAC TROMETHAMINE 15 MG/ML IJ SOLN
15.0000 mg | Freq: Once | INTRAMUSCULAR | Status: AC
  Administered 2024-02-10: 15 mg via INTRAVENOUS
  Filled 2024-02-10: qty 1

## 2024-02-10 NOTE — ED Triage Notes (Signed)
 See first nurse note. Pt reports pain under left breast to rib cage started a few days ago. Worsening pain when moving left arm or inspiration. Pain reproducible with palpation.

## 2024-02-10 NOTE — ED Notes (Signed)
 Patient declined discharge vital signs. Patient called husband to pick her up.

## 2024-02-10 NOTE — ED Provider Notes (Signed)
 "  [facility] Provider Note    Event Date/Time   First MD Initiated Contact with Patient 02/10/24 1503     (approximate)   History   rib cage pain   HPI  Karen [name] is a 69 y.o. female  with a past medical history of diabetes, CKD, chronic pancreatitis due to chronic alcoholism, chronic fatigue, vitamin D  and B12 deficiency, HTN, pancreas divisum presents to the emergency department with pain underneath her left breast into the anterolateral aspects of the rib cage that started two days ago.  She reports the pain is worse when she does some twisting motions or takes a deep breath.  The pain is reproducible with palpation she reports.  She denies any nausea or vomiting, abdominal pain, back pain, urinary symptoms, chest pain or shortness of breath, fall or injury.   Physical Exam   Triage Vital Signs: ED Triage Vitals  Encounter Vitals Group     BP 02/10/24 1209 118/76     Girls Systolic BP Percentile --      Girls Diastolic BP Percentile --      Boys Systolic BP Percentile --      Boys Diastolic BP Percentile --      Pulse Rate 02/10/24 1209 95     Resp 02/10/24 1209 18     Temp 02/10/24 1209 98.1 F (36.7 C)     Temp src --      SpO2 02/10/24 1209 99 %     Weight 02/10/24 1210 135 lb (61.2 kg)     Height 02/10/24 1210 5' (1.524 m)     Head Circumference --      Peak Flow --      Pain Score 02/10/24 1210 9     Pain Loc --      Pain Education --      Exclude from Growth Chart --     Most recent vital signs: Vitals:   02/10/24 1209 02/10/24 1629  BP: 118/76 (!) 145/73  Pulse: 95 96  Resp: 18 19  Temp: 98.1 F (36.7 C) 98.3 F (36.8 C)  SpO2: 99% 100%    General: Awake, in no acute distress. Appears stated age. CV: Good peripheral perfusion.  Respiratory:Normal respiratory effort.  No respiratory distress. CTAB. GI: Soft, non-distended, TTP in epigastric region. Skin:Warm, dry, intact. No rashes, lesions, or ecchymosis.   Neurological: A&Ox4 to person, place, time, and situation.  TTP along left anterolateral ribs approx #9-10.  ED Results / Procedures / Treatments   Labs (all labs ordered are listed, but only abnormal results are displayed) Labs Reviewed  CBC - Abnormal; Notable for the following components:      Result Value   RBC 3.39 (*)    Hemoglobin 10.5 (*)    HCT 29.8 (*)    All other components within normal limits  COMPREHENSIVE METABOLIC PANEL WITH GFR - Abnormal; Notable for the following components:   Sodium 134 (*)    CO2 19 (*)    Glucose, Bld 312 (*)    Creatinine, Ser 2.19 (*)    Alkaline Phosphatase 388 (*)    GFR, Estimated 24 (*)    Anion gap 16 (*)    All other components within normal limits  LIPASE, BLOOD - Abnormal; Notable for the following components:   Lipase 115 (*)    All other components within normal limits     EKG  NSR Rate: 94 bpm Rhythm: regular Axis: positive PR Interval: 148  ms QRS Complex: 76 ms ST Segment: isometric QTc interval: 437 ms   RADIOLOGY CXR FINDINGS:  The heart size and mediastinal contours are within normal limits.  Both lungs are clear. Surgical clips are seen overlying the superior  mediastinum on the right. The visualized skeletal structures are  unremarkable.    IMPRESSION:  No active cardiopulmonary disease.   Left Ribs  FINDINGS: No fracture or other bone lesions are seen involving the ribs. BB placed at site of pain, no abnormality subjacent to the BB. The left lung is clear.   IMPRESSION: Negative radiographs of the left ribs.   PROCEDURES:  Critical Care performed: No   Procedures   MEDICATIONS ORDERED IN ED: Medications  ketorolac  (TORADOL ) 15 MG/ML injection 15 mg (15 mg Intravenous Given 02/10/24 1559)     IMPRESSION / MDM / ASSESSMENT AND PLAN / ED COURSE  I reviewed the triage vital signs and the nursing notes.                              Differential diagnosis includes, but is not limited  to, MSK pain, rib contusion, rib fracture, PTX, gastritis, pancreatitis, elevated blood glucose  Patient's presentation is most consistent with acute complicated illness / injury requiring diagnostic workup.  Patient is a 69 year old female presenting with signs and symptoms as described above.  She has satting at 100% on room air.  Pulse rate at 96 bpm.  She is overall well-appearing on exam.  No vomiting.  No fever.  Normal lung sounds.  She does have some tenderness to palpation in the anterolateral left rib regions but it also has some mild tenderness to the epigastric region.  Given history of chronic pancreatitis, will obtain labs at this time.  Chest x-ray ordered from triage is negative for any acute findings per my individual interpretation.  Added left rib x-ray which was negative for any fracture or bony lesion.  Suspect musculoskeletal strain.  Given 1 dose of Toradol  and patient reports improvement in pain. CBC with no leukocytosis, Hemoglobin stable at 10.5, hematocrit 29.8. CMP shows glucose of 312, mild anion gap at 16, CO2 at 19.  Recent labs show CO2 at 19 from 2 weeks ago.  Lipase mildly elevated at 115 and alk phos elevated at 388, has similar past lab values consistent with chronic changes. Low suspicion for acute pancreatitis or DKA at this time.  She reports she takes her diabetic medication regularly, I did tell her to take a dose of her insulin  when she gets home.  She is not having any polydipsia, polyuria at this time.  Discussed over-the-counter medication usage with her for likely musculoskeletal pain.  She will follow-up with her primary care provider following today's visit.  The patient may return to the emergency department for any new, worsening, or concerning symptoms. Patient was given the opportunity to ask questions; all questions were answered. Emergency department return precautions were discussed with the patient.  Patient is in agreement to the treatment plan.   Patient is stable for discharge.   FINAL CLINICAL IMPRESSION(S) / ED DIAGNOSES   Final diagnoses:  Blood glucose elevated  Rib pain on left side     Rx / DC Orders   ED Discharge Orders     None        Note:  This document was prepared using Dragon voice recognition software and may include unintentional dictation errors.     Sheron,  Vonnie Spagnolo, PA-C 02/10/24 1709    Arlander Charleston, MD 02/10/24 2222  "

## 2024-02-10 NOTE — ED Triage Notes (Signed)
 Pt comes via EMS from home with cp. Pt states muscular when she touch and it and breaths. Pt states woke up few days ago and it was hurting.   CBG 358

## 2024-02-10 NOTE — Discharge Instructions (Signed)
 You were seen in the emergency department for left rib pain.  Please take over-the-counter pain medication at home as we discussed.  Follow-up with your primary doctor following today's visit. When you get home, take your diabetes medication as we discussed.  Continue taking it as you have been at home.  Check your sugars regularly.  Return to the ER with any new, worsening or concerning symptoms.

## 2024-02-11 ENCOUNTER — Other Ambulatory Visit: Payer: Self-pay | Admitting: Family

## 2024-02-17 ENCOUNTER — Ambulatory Visit

## 2024-04-18 ENCOUNTER — Ambulatory Visit: Admitting: Family
